# Patient Record
Sex: Male | Born: 1935 | Race: White | Hispanic: No | Smoking: Former smoker
Health system: Southern US, Community
[De-identification: ages and names within clinical notes are randomized; demographics above are authoritative.]

## PROBLEM LIST (undated history)

## (undated) DIAGNOSIS — Z95810 Presence of automatic (implantable) cardiac defibrillator: Secondary | ICD-10-CM

## (undated) DIAGNOSIS — I714 Abdominal aortic aneurysm, without rupture, unspecified: Secondary | ICD-10-CM

## (undated) DIAGNOSIS — Z8701 Personal history of pneumonia (recurrent): Secondary | ICD-10-CM

## (undated) DIAGNOSIS — I5042 Chronic combined systolic (congestive) and diastolic (congestive) heart failure: Secondary | ICD-10-CM

## (undated) DIAGNOSIS — M1990 Unspecified osteoarthritis, unspecified site: Secondary | ICD-10-CM

## (undated) DIAGNOSIS — M549 Dorsalgia, unspecified: Secondary | ICD-10-CM

## (undated) DIAGNOSIS — Z860100 Personal history of colon polyps, unspecified: Secondary | ICD-10-CM

## (undated) DIAGNOSIS — I779 Disorder of arteries and arterioles, unspecified: Secondary | ICD-10-CM

## (undated) DIAGNOSIS — I739 Peripheral vascular disease, unspecified: Secondary | ICD-10-CM

## (undated) DIAGNOSIS — IMO0002 Reserved for concepts with insufficient information to code with codable children: Secondary | ICD-10-CM

## (undated) DIAGNOSIS — I119 Hypertensive heart disease without heart failure: Secondary | ICD-10-CM

## (undated) DIAGNOSIS — I10 Essential (primary) hypertension: Secondary | ICD-10-CM

## (undated) DIAGNOSIS — T82198A Other mechanical complication of other cardiac electronic device, initial encounter: Secondary | ICD-10-CM

## (undated) DIAGNOSIS — R55 Syncope and collapse: Secondary | ICD-10-CM

## (undated) DIAGNOSIS — Z720 Tobacco use: Secondary | ICD-10-CM

## (undated) DIAGNOSIS — I255 Ischemic cardiomyopathy: Secondary | ICD-10-CM

## (undated) DIAGNOSIS — I2510 Atherosclerotic heart disease of native coronary artery without angina pectoris: Secondary | ICD-10-CM

## (undated) DIAGNOSIS — K5730 Diverticulosis of large intestine without perforation or abscess without bleeding: Secondary | ICD-10-CM

## (undated) DIAGNOSIS — C4490 Unspecified malignant neoplasm of skin, unspecified: Secondary | ICD-10-CM

## (undated) DIAGNOSIS — E7800 Pure hypercholesterolemia, unspecified: Secondary | ICD-10-CM

## (undated) DIAGNOSIS — I472 Ventricular tachycardia: Secondary | ICD-10-CM

## (undated) DIAGNOSIS — J449 Chronic obstructive pulmonary disease, unspecified: Secondary | ICD-10-CM

## (undated) DIAGNOSIS — Z8739 Personal history of other diseases of the musculoskeletal system and connective tissue: Secondary | ICD-10-CM

## (undated) DIAGNOSIS — L309 Dermatitis, unspecified: Secondary | ICD-10-CM

## (undated) DIAGNOSIS — Z950 Presence of cardiac pacemaker: Secondary | ICD-10-CM

## (undated) HISTORY — PX: CARDIAC CATHETERIZATION: SHX172

## (undated) HISTORY — DX: Peripheral vascular disease, unspecified: I73.9

## (undated) HISTORY — PX: CARDIAC DEFIBRILLATOR PLACEMENT: SHX171

## (undated) HISTORY — DX: Reserved for concepts with insufficient information to code with codable children: IMO0002

## (undated) HISTORY — DX: Abdominal aortic aneurysm, without rupture: I71.4

## (undated) HISTORY — DX: Chronic obstructive pulmonary disease, unspecified: J44.9

## (undated) HISTORY — DX: Other mechanical complication of other cardiac electronic device, initial encounter: T82.198A

## (undated) HISTORY — DX: Chronic combined systolic (congestive) and diastolic (congestive) heart failure: I50.42

## (undated) HISTORY — PX: SQUAMOUS CELL CARCINOMA EXCISION: SHX2433

## (undated) HISTORY — DX: Essential (primary) hypertension: I10

## (undated) HISTORY — DX: Dermatitis, unspecified: L30.9

## (undated) HISTORY — DX: Tobacco use: Z72.0

## (undated) HISTORY — DX: Diverticulosis of large intestine without perforation or abscess without bleeding: K57.30

## (undated) HISTORY — DX: Abdominal aortic aneurysm, without rupture, unspecified: I71.40

## (undated) HISTORY — PX: TONSILLECTOMY: SUR1361

## (undated) HISTORY — DX: Personal history of colonic polyps: Z86.010

## (undated) HISTORY — DX: Personal history of pneumonia (recurrent): Z87.01

## (undated) HISTORY — PX: OTHER SURGICAL HISTORY: SHX169

## (undated) HISTORY — DX: Dorsalgia, unspecified: M54.9

## (undated) HISTORY — DX: Disorder of arteries and arterioles, unspecified: I77.9

## (undated) HISTORY — DX: Personal history of other diseases of the musculoskeletal system and connective tissue: Z87.39

## (undated) HISTORY — DX: Unspecified osteoarthritis, unspecified site: M19.90

## (undated) HISTORY — DX: Hypertensive heart disease without heart failure: I11.9

## (undated) HISTORY — DX: Atherosclerotic heart disease of native coronary artery without angina pectoris: I25.10

## (undated) HISTORY — DX: Syncope and collapse: R55

## (undated) HISTORY — PX: EXTERNAL EAR SURGERY: SHX627

## (undated) HISTORY — DX: Presence of automatic (implantable) cardiac defibrillator: Z95.810

## (undated) HISTORY — DX: Ischemic cardiomyopathy: I25.5

## (undated) HISTORY — DX: Pure hypercholesterolemia, unspecified: E78.00

## (undated) HISTORY — DX: Personal history of colon polyps, unspecified: Z86.0100

---

## 1998-05-23 ENCOUNTER — Ambulatory Visit: Admission: RE | Admit: 1998-05-23 | Discharge: 1998-05-23 | Payer: Self-pay | Admitting: Vascular Surgery

## 1998-05-30 ENCOUNTER — Inpatient Hospital Stay (HOSPITAL_COMMUNITY): Admission: RE | Admit: 1998-05-30 | Discharge: 1998-06-04 | Payer: Self-pay | Admitting: Vascular Surgery

## 2000-09-11 ENCOUNTER — Encounter (INDEPENDENT_AMBULATORY_CARE_PROVIDER_SITE_OTHER): Payer: Self-pay | Admitting: Specialist

## 2000-09-11 ENCOUNTER — Other Ambulatory Visit: Admission: RE | Admit: 2000-09-11 | Discharge: 2000-09-11 | Payer: Self-pay | Admitting: Gastroenterology

## 2000-10-22 HISTORY — PX: ABDOMINAL AORTIC ANEURYSM REPAIR: SUR1152

## 2000-11-06 ENCOUNTER — Ambulatory Visit (HOSPITAL_COMMUNITY): Admission: RE | Admit: 2000-11-06 | Discharge: 2000-11-06 | Payer: Self-pay | Admitting: Specialist

## 2002-02-19 ENCOUNTER — Inpatient Hospital Stay (HOSPITAL_COMMUNITY): Admission: RE | Admit: 2002-02-19 | Discharge: 2002-02-21 | Payer: Self-pay | Admitting: Cardiology

## 2002-02-19 HISTORY — PX: OTHER SURGICAL HISTORY: SHX169

## 2004-10-31 ENCOUNTER — Ambulatory Visit: Payer: Self-pay | Admitting: Pulmonary Disease

## 2004-11-01 ENCOUNTER — Ambulatory Visit: Payer: Self-pay | Admitting: Pulmonary Disease

## 2005-03-26 ENCOUNTER — Ambulatory Visit: Payer: Self-pay | Admitting: Pulmonary Disease

## 2005-03-27 ENCOUNTER — Ambulatory Visit: Payer: Self-pay | Admitting: Pulmonary Disease

## 2005-08-27 ENCOUNTER — Ambulatory Visit: Payer: Self-pay | Admitting: Gastroenterology

## 2005-09-10 ENCOUNTER — Encounter (INDEPENDENT_AMBULATORY_CARE_PROVIDER_SITE_OTHER): Payer: Self-pay | Admitting: *Deleted

## 2005-09-10 ENCOUNTER — Ambulatory Visit: Payer: Self-pay | Admitting: Gastroenterology

## 2005-09-25 ENCOUNTER — Ambulatory Visit: Payer: Self-pay | Admitting: Pulmonary Disease

## 2005-10-01 ENCOUNTER — Ambulatory Visit: Payer: Self-pay | Admitting: Pulmonary Disease

## 2006-04-25 ENCOUNTER — Ambulatory Visit: Payer: Self-pay | Admitting: Pulmonary Disease

## 2006-05-09 ENCOUNTER — Ambulatory Visit: Payer: Self-pay | Admitting: Pulmonary Disease

## 2006-08-06 ENCOUNTER — Ambulatory Visit: Payer: Self-pay | Admitting: Internal Medicine

## 2006-10-29 ENCOUNTER — Ambulatory Visit: Payer: Self-pay | Admitting: Pulmonary Disease

## 2006-10-31 ENCOUNTER — Ambulatory Visit: Payer: Self-pay | Admitting: Cardiology

## 2006-11-01 ENCOUNTER — Ambulatory Visit: Payer: Self-pay | Admitting: Pulmonary Disease

## 2006-11-01 LAB — CONVERTED CEMR LAB
Albumin: 4.1 g/dL (ref 3.5–5.2)
BUN: 12 mg/dL (ref 6–23)
CO2: 27 meq/L (ref 19–32)
Calcium: 9.8 mg/dL (ref 8.4–10.5)
Chloride: 108 meq/L (ref 96–112)
Chol/HDL Ratio, serum: 3.4
Cholesterol: 149 mg/dL (ref 0–200)
Creatinine, Ser: 1.1 mg/dL (ref 0.4–1.5)
GFR calc non Af Amer: 70 mL/min
Triglyceride fasting, serum: 163 mg/dL — ABNORMAL HIGH (ref 0–149)

## 2006-11-13 ENCOUNTER — Ambulatory Visit: Payer: Self-pay

## 2006-12-11 ENCOUNTER — Ambulatory Visit: Payer: Self-pay

## 2006-12-11 ENCOUNTER — Encounter: Payer: Self-pay | Admitting: Cardiology

## 2007-01-07 ENCOUNTER — Ambulatory Visit: Payer: Self-pay | Admitting: Cardiology

## 2007-01-21 ENCOUNTER — Ambulatory Visit: Payer: Self-pay | Admitting: Cardiology

## 2007-01-21 LAB — CONVERTED CEMR LAB
Bilirubin, Direct: 0.1 mg/dL (ref 0.0–0.3)
CO2: 27 meq/L (ref 19–32)
Cholesterol: 131 mg/dL (ref 0–200)
GFR calc Af Amer: 76 mL/min
Glucose, Bld: 77 mg/dL (ref 70–99)
HDL: 40.9 mg/dL (ref >39.0)
Potassium: 4.8 meq/L (ref 3.5–5.1)
Total Bilirubin: 1 mg/dL (ref 0.3–1.2)
Total CHOL/HDL Ratio: 3.2
Total Protein: 6.8 g/dL (ref 6.0–8.3)
Triglycerides: 100 mg/dL (ref 0–149)

## 2007-02-26 ENCOUNTER — Ambulatory Visit: Payer: Self-pay | Admitting: Cardiology

## 2007-04-11 ENCOUNTER — Ambulatory Visit: Payer: Self-pay | Admitting: Cardiology

## 2007-05-12 ENCOUNTER — Ambulatory Visit: Payer: Self-pay | Admitting: Cardiology

## 2007-05-26 ENCOUNTER — Ambulatory Visit: Payer: Self-pay | Admitting: Cardiology

## 2007-05-26 LAB — CONVERTED CEMR LAB
Calcium: 9.7 mg/dL (ref 8.4–10.5)
Chloride: 103 meq/L (ref 96–112)
GFR calc Af Amer: 69 mL/min
GFR calc non Af Amer: 57 mL/min
Sodium: 138 meq/L (ref 135–145)

## 2007-06-19 ENCOUNTER — Ambulatory Visit: Payer: Self-pay | Admitting: Cardiology

## 2007-07-21 ENCOUNTER — Ambulatory Visit: Payer: Self-pay | Admitting: Cardiology

## 2007-07-30 ENCOUNTER — Ambulatory Visit: Payer: Self-pay | Admitting: Pulmonary Disease

## 2007-10-23 HISTORY — PX: CARDIAC DEFIBRILLATOR PLACEMENT: SHX171

## 2007-12-08 DIAGNOSIS — I739 Peripheral vascular disease, unspecified: Secondary | ICD-10-CM

## 2007-12-08 DIAGNOSIS — M1990 Unspecified osteoarthritis, unspecified site: Secondary | ICD-10-CM | POA: Insufficient documentation

## 2007-12-08 DIAGNOSIS — K649 Unspecified hemorrhoids: Secondary | ICD-10-CM | POA: Insufficient documentation

## 2007-12-08 DIAGNOSIS — I10 Essential (primary) hypertension: Secondary | ICD-10-CM

## 2007-12-08 DIAGNOSIS — J449 Chronic obstructive pulmonary disease, unspecified: Secondary | ICD-10-CM

## 2007-12-09 ENCOUNTER — Ambulatory Visit: Payer: Self-pay | Admitting: Pulmonary Disease

## 2007-12-09 DIAGNOSIS — K5730 Diverticulosis of large intestine without perforation or abscess without bleeding: Secondary | ICD-10-CM

## 2007-12-09 DIAGNOSIS — D126 Benign neoplasm of colon, unspecified: Secondary | ICD-10-CM | POA: Insufficient documentation

## 2007-12-15 ENCOUNTER — Ambulatory Visit: Payer: Self-pay | Admitting: Pulmonary Disease

## 2007-12-22 LAB — CONVERTED CEMR LAB
Albumin: 4 g/dL (ref 3.5–5.2)
Basophils Absolute: 0 10*3/uL (ref 0.0–0.1)
Bilirubin, Direct: 0.2 mg/dL (ref 0.0–0.3)
Chloride: 101 meq/L (ref 96–112)
Cholesterol: 124 mg/dL (ref 0–200)
Eosinophils Absolute: 0.9 10*3/uL — ABNORMAL HIGH (ref 0.0–0.6)
Eosinophils Relative: 17.9 % — ABNORMAL HIGH (ref 0.0–5.0)
GFR calc Af Amer: 84 mL/min
GFR calc non Af Amer: 70 mL/min
Glucose, Bld: 93 mg/dL (ref 70–99)
HCT: 42.7 % (ref 39.0–52.0)
Lymphocytes Relative: 25.9 % (ref 12.0–46.0)
MCHC: 34.1 g/dL (ref 30.0–36.0)
MCV: 105.6 fL — ABNORMAL HIGH (ref 78.0–100.0)
Monocytes Absolute: 0.4 10*3/uL (ref 0.2–0.7)
Neutro Abs: 2.3 10*3/uL (ref 1.4–7.7)
Neutrophils Relative %: 47.1 % (ref 43.0–77.0)
PSA: 1.11 ng/mL (ref 0.10–4.00)
Potassium: 4.8 meq/L (ref 3.5–5.1)
RBC: 4.04 M/uL — ABNORMAL LOW (ref 4.22–5.81)
Sodium: 138 meq/L (ref 135–145)
TSH: 2.48 microintl units/mL (ref 0.35–5.50)
Total CHOL/HDL Ratio: 3.4
Uric Acid, Serum: 3.3 mg/dL (ref 2.4–7.0)

## 2008-02-03 ENCOUNTER — Ambulatory Visit: Payer: Self-pay | Admitting: Cardiology

## 2008-03-22 HISTORY — PX: OTHER SURGICAL HISTORY: SHX169

## 2008-03-23 ENCOUNTER — Ambulatory Visit: Payer: Self-pay

## 2008-03-23 ENCOUNTER — Ambulatory Visit: Payer: Self-pay | Admitting: Internal Medicine

## 2008-03-23 ENCOUNTER — Ambulatory Visit: Payer: Self-pay | Admitting: Pulmonary Disease

## 2008-03-23 LAB — CONVERTED CEMR LAB
Basophils Absolute: 0 10*3/uL (ref 0.0–0.1)
CO2: 29 meq/L (ref 19–32)
Calcium: 10.2 mg/dL (ref 8.4–10.5)
GFR calc Af Amer: 76 mL/min
Glucose, Bld: 111 mg/dL — ABNORMAL HIGH (ref 70–99)
Hemoglobin: 14.1 g/dL (ref 13.0–17.0)
INR: 1 (ref 0.8–1.0)
Lymphocytes Relative: 18.2 % (ref 12.0–46.0)
MCHC: 35.3 g/dL (ref 30.0–36.0)
Monocytes Relative: 10.6 % (ref 3.0–12.0)
Neutro Abs: 4.2 10*3/uL (ref 1.4–7.7)
Platelets: 102 10*3/uL — ABNORMAL LOW (ref 150–400)
Potassium: 5.2 meq/L — ABNORMAL HIGH (ref 3.5–5.1)
Prothrombin Time: 11.6 s (ref 10.9–13.3)
RDW: 11.8 % (ref 11.5–14.6)
Sodium: 133 meq/L — ABNORMAL LOW (ref 135–145)
aPTT: 26.4 s (ref 21.7–29.8)

## 2008-03-25 ENCOUNTER — Ambulatory Visit: Payer: Self-pay | Admitting: Adult Health

## 2008-03-25 ENCOUNTER — Ambulatory Visit: Payer: Self-pay | Admitting: Cardiology

## 2008-03-25 LAB — CONVERTED CEMR LAB
BUN: 13 mg/dL (ref 6–23)
Basophils Relative: 0.3 % (ref 0.0–1.0)
CO2: 27 meq/L (ref 19–32)
Chloride: 92 meq/L — ABNORMAL LOW (ref 96–112)
Creatinine, Ser: 1 mg/dL (ref 0.4–1.5)
Eosinophils Relative: 10.6 % — ABNORMAL HIGH (ref 0.0–5.0)
Glucose, Bld: 155 mg/dL — ABNORMAL HIGH (ref 70–99)
HCT: 40.4 % (ref 39.0–52.0)
Monocytes Relative: 8.4 % (ref 3.0–12.0)
Neutrophils Relative %: 63 % (ref 43.0–77.0)
Platelets: 124 10*3/uL — ABNORMAL LOW (ref 150–400)
Potassium: 4 meq/L (ref 3.5–5.1)
RBC: 3.87 M/uL — ABNORMAL LOW (ref 4.22–5.81)
WBC: 6 10*3/uL (ref 4.5–10.5)

## 2008-03-29 ENCOUNTER — Ambulatory Visit: Payer: Self-pay | Admitting: Cardiology

## 2008-03-29 LAB — CONVERTED CEMR LAB
BUN: 11 mg/dL (ref 6–23)
CO2: 30 meq/L (ref 19–32)
Chloride: 93 meq/L — ABNORMAL LOW (ref 96–112)
Creatinine, Ser: 1 mg/dL (ref 0.4–1.5)
GFR calc non Af Amer: 77 mL/min
Glucose, Bld: 149 mg/dL — ABNORMAL HIGH (ref 70–99)
Potassium: 4.7 meq/L (ref 3.5–5.1)

## 2008-03-30 ENCOUNTER — Ambulatory Visit: Payer: Self-pay | Admitting: Cardiology

## 2008-03-30 ENCOUNTER — Inpatient Hospital Stay (HOSPITAL_BASED_OUTPATIENT_CLINIC_OR_DEPARTMENT_OTHER): Admission: RE | Admit: 2008-03-30 | Discharge: 2008-03-30 | Payer: Self-pay | Admitting: Cardiology

## 2008-03-30 ENCOUNTER — Telehealth (INDEPENDENT_AMBULATORY_CARE_PROVIDER_SITE_OTHER): Payer: Self-pay | Admitting: *Deleted

## 2008-03-31 ENCOUNTER — Ambulatory Visit (HOSPITAL_COMMUNITY): Admission: RE | Admit: 2008-03-31 | Discharge: 2008-03-31 | Payer: Self-pay | Admitting: Cardiology

## 2008-03-31 ENCOUNTER — Telehealth: Payer: Self-pay | Admitting: Pulmonary Disease

## 2008-04-01 ENCOUNTER — Ambulatory Visit: Payer: Self-pay | Admitting: Internal Medicine

## 2008-04-03 ENCOUNTER — Ambulatory Visit (HOSPITAL_COMMUNITY): Admission: RE | Admit: 2008-04-03 | Discharge: 2008-04-03 | Payer: Self-pay | Admitting: Internal Medicine

## 2008-04-07 ENCOUNTER — Encounter: Payer: Self-pay | Admitting: Internal Medicine

## 2008-04-07 LAB — CONVERTED CEMR LAB
BUN: 9 mg/dL (ref 6–23)
Basophils Relative: 0.6 % (ref 0.0–1.0)
Calcium: 9.9 mg/dL (ref 8.4–10.5)
Chloride: 99 meq/L (ref 96–112)
Creatinine, Ser: 1 mg/dL (ref 0.4–1.5)
Eosinophils Absolute: 0.6 10*3/uL (ref 0.0–0.7)
Eosinophils Relative: 8.6 % — ABNORMAL HIGH (ref 0.0–5.0)
GFR calc non Af Amer: 77 mL/min
HCT: 39.7 % (ref 39.0–52.0)
Hemoglobin: 14.1 g/dL (ref 13.0–17.0)
MCV: 104.4 fL — ABNORMAL HIGH (ref 78.0–100.0)
Neutro Abs: 3.7 10*3/uL (ref 1.4–7.7)
Neutrophils Relative %: 55.9 % (ref 43.0–77.0)
RBC: 3.8 M/uL — ABNORMAL LOW (ref 4.22–5.81)
WBC: 6.7 10*3/uL (ref 4.5–10.5)

## 2008-04-12 ENCOUNTER — Telehealth (INDEPENDENT_AMBULATORY_CARE_PROVIDER_SITE_OTHER): Payer: Self-pay | Admitting: *Deleted

## 2008-04-13 ENCOUNTER — Inpatient Hospital Stay (HOSPITAL_COMMUNITY): Admission: AD | Admit: 2008-04-13 | Discharge: 2008-04-14 | Payer: Self-pay | Admitting: Internal Medicine

## 2008-04-13 ENCOUNTER — Ambulatory Visit: Payer: Self-pay | Admitting: Internal Medicine

## 2008-04-13 DIAGNOSIS — Z95810 Presence of automatic (implantable) cardiac defibrillator: Secondary | ICD-10-CM

## 2008-04-14 ENCOUNTER — Encounter (INDEPENDENT_AMBULATORY_CARE_PROVIDER_SITE_OTHER): Payer: Self-pay | Admitting: Interventional Radiology

## 2008-04-14 ENCOUNTER — Encounter: Payer: Self-pay | Admitting: Pulmonary Disease

## 2008-04-20 ENCOUNTER — Telehealth (INDEPENDENT_AMBULATORY_CARE_PROVIDER_SITE_OTHER): Payer: Self-pay | Admitting: *Deleted

## 2008-04-22 ENCOUNTER — Ambulatory Visit: Payer: Self-pay | Admitting: Internal Medicine

## 2008-04-29 ENCOUNTER — Ambulatory Visit: Payer: Self-pay

## 2008-04-29 ENCOUNTER — Ambulatory Visit: Payer: Self-pay | Admitting: Internal Medicine

## 2008-04-29 ENCOUNTER — Ambulatory Visit (HOSPITAL_COMMUNITY): Admission: RE | Admit: 2008-04-29 | Discharge: 2008-04-30 | Payer: Self-pay | Admitting: Internal Medicine

## 2008-05-19 ENCOUNTER — Ambulatory Visit: Payer: Self-pay

## 2008-07-14 ENCOUNTER — Ambulatory Visit: Payer: Self-pay | Admitting: Pulmonary Disease

## 2008-07-20 ENCOUNTER — Ambulatory Visit: Payer: Self-pay | Admitting: Internal Medicine

## 2008-07-26 ENCOUNTER — Telehealth: Payer: Self-pay | Admitting: Pulmonary Disease

## 2008-07-26 ENCOUNTER — Ambulatory Visit: Payer: Self-pay | Admitting: Pulmonary Disease

## 2008-07-27 DIAGNOSIS — T148XXA Other injury of unspecified body region, initial encounter: Secondary | ICD-10-CM

## 2008-07-28 ENCOUNTER — Encounter: Payer: Self-pay | Admitting: Pulmonary Disease

## 2008-07-28 ENCOUNTER — Ambulatory Visit: Payer: Self-pay | Admitting: Internal Medicine

## 2008-07-30 ENCOUNTER — Encounter: Payer: Self-pay | Admitting: Pulmonary Disease

## 2008-10-19 ENCOUNTER — Ambulatory Visit: Payer: Self-pay | Admitting: Internal Medicine

## 2008-10-25 ENCOUNTER — Encounter: Payer: Self-pay | Admitting: Pulmonary Disease

## 2008-10-26 ENCOUNTER — Ambulatory Visit: Payer: Self-pay | Admitting: Pulmonary Disease

## 2008-11-02 ENCOUNTER — Ambulatory Visit: Payer: Self-pay

## 2008-12-28 ENCOUNTER — Encounter: Payer: Self-pay | Admitting: Internal Medicine

## 2009-01-18 ENCOUNTER — Ambulatory Visit: Payer: Self-pay | Admitting: Internal Medicine

## 2009-01-27 ENCOUNTER — Encounter: Payer: Self-pay | Admitting: Cardiology

## 2009-01-27 ENCOUNTER — Ambulatory Visit: Payer: Self-pay | Admitting: Cardiology

## 2009-02-04 ENCOUNTER — Telehealth (INDEPENDENT_AMBULATORY_CARE_PROVIDER_SITE_OTHER): Payer: Self-pay | Admitting: *Deleted

## 2009-02-07 ENCOUNTER — Ambulatory Visit: Payer: Self-pay | Admitting: Cardiology

## 2009-02-07 ENCOUNTER — Encounter: Payer: Self-pay | Admitting: Cardiology

## 2009-02-07 ENCOUNTER — Ambulatory Visit: Payer: Self-pay

## 2009-02-09 ENCOUNTER — Ambulatory Visit: Payer: Self-pay | Admitting: Pulmonary Disease

## 2009-02-15 DIAGNOSIS — J309 Allergic rhinitis, unspecified: Secondary | ICD-10-CM | POA: Insufficient documentation

## 2009-04-19 ENCOUNTER — Ambulatory Visit: Payer: Self-pay | Admitting: Internal Medicine

## 2009-04-19 DIAGNOSIS — R55 Syncope and collapse: Secondary | ICD-10-CM

## 2009-04-29 ENCOUNTER — Ambulatory Visit: Payer: Self-pay | Admitting: Cardiology

## 2009-05-18 ENCOUNTER — Ambulatory Visit: Payer: Self-pay

## 2009-05-18 ENCOUNTER — Encounter: Payer: Self-pay | Admitting: Internal Medicine

## 2009-07-21 ENCOUNTER — Ambulatory Visit: Payer: Self-pay | Admitting: Cardiology

## 2009-07-21 ENCOUNTER — Encounter: Payer: Self-pay | Admitting: Internal Medicine

## 2009-08-22 ENCOUNTER — Encounter: Payer: Self-pay | Admitting: Internal Medicine

## 2009-08-30 ENCOUNTER — Telehealth (INDEPENDENT_AMBULATORY_CARE_PROVIDER_SITE_OTHER): Payer: Self-pay | Admitting: *Deleted

## 2009-09-23 ENCOUNTER — Ambulatory Visit: Payer: Self-pay | Admitting: Pulmonary Disease

## 2009-09-23 DIAGNOSIS — M8000XA Age-related osteoporosis with current pathological fracture, unspecified site, initial encounter for fracture: Secondary | ICD-10-CM

## 2009-09-23 DIAGNOSIS — M109 Gout, unspecified: Secondary | ICD-10-CM

## 2009-10-17 ENCOUNTER — Encounter: Payer: Self-pay | Admitting: Internal Medicine

## 2009-10-27 ENCOUNTER — Encounter: Payer: Self-pay | Admitting: Internal Medicine

## 2009-10-27 ENCOUNTER — Ambulatory Visit: Payer: Self-pay

## 2009-11-15 ENCOUNTER — Telehealth: Payer: Self-pay | Admitting: Pulmonary Disease

## 2009-11-17 ENCOUNTER — Encounter: Payer: Self-pay | Admitting: Cardiology

## 2009-11-17 ENCOUNTER — Encounter: Payer: Self-pay | Admitting: Internal Medicine

## 2009-11-17 ENCOUNTER — Ambulatory Visit: Payer: Self-pay

## 2009-11-17 DIAGNOSIS — I739 Peripheral vascular disease, unspecified: Secondary | ICD-10-CM

## 2010-02-16 ENCOUNTER — Encounter: Payer: Self-pay | Admitting: Internal Medicine

## 2010-04-07 ENCOUNTER — Ambulatory Visit: Payer: Self-pay | Admitting: Internal Medicine

## 2010-05-29 ENCOUNTER — Encounter: Payer: Self-pay | Admitting: Cardiology

## 2010-06-01 ENCOUNTER — Encounter: Payer: Self-pay | Admitting: Internal Medicine

## 2010-06-02 ENCOUNTER — Ambulatory Visit: Payer: Self-pay | Admitting: Cardiology

## 2010-06-02 ENCOUNTER — Ambulatory Visit: Payer: Self-pay

## 2010-06-02 ENCOUNTER — Encounter: Payer: Self-pay | Admitting: Internal Medicine

## 2010-06-16 ENCOUNTER — Ambulatory Visit: Payer: Self-pay | Admitting: Pulmonary Disease

## 2010-06-16 ENCOUNTER — Ambulatory Visit: Payer: Self-pay | Admitting: Internal Medicine

## 2010-06-20 ENCOUNTER — Telehealth (INDEPENDENT_AMBULATORY_CARE_PROVIDER_SITE_OTHER): Payer: Self-pay | Admitting: *Deleted

## 2010-07-07 ENCOUNTER — Encounter: Payer: Self-pay | Admitting: Internal Medicine

## 2010-08-17 ENCOUNTER — Telehealth (INDEPENDENT_AMBULATORY_CARE_PROVIDER_SITE_OTHER): Payer: Self-pay | Admitting: *Deleted

## 2010-10-10 ENCOUNTER — Ambulatory Visit: Payer: Self-pay | Admitting: Pulmonary Disease

## 2010-10-11 ENCOUNTER — Encounter: Payer: Self-pay | Admitting: Pulmonary Disease

## 2010-10-11 ENCOUNTER — Ambulatory Visit: Payer: Self-pay | Admitting: Pulmonary Disease

## 2010-10-18 ENCOUNTER — Encounter (INDEPENDENT_AMBULATORY_CARE_PROVIDER_SITE_OTHER): Payer: Self-pay | Admitting: *Deleted

## 2010-10-21 ENCOUNTER — Encounter: Payer: Self-pay | Admitting: Internal Medicine

## 2010-10-21 LAB — CONVERTED CEMR LAB
ALT: 19 units/L (ref 0–53)
AST: 38 units/L — ABNORMAL HIGH (ref 0–37)
Basophils Relative: 0.6 % (ref 0.0–3.0)
Bilirubin, Direct: 0.2 mg/dL (ref 0.0–0.3)
Chloride: 95 meq/L — ABNORMAL LOW (ref 96–112)
Cholesterol: 112 mg/dL (ref 0–200)
Eosinophils Absolute: 1 10*3/uL — ABNORMAL HIGH (ref 0.0–0.7)
LDL Cholesterol: 59 mg/dL (ref 0–99)
MCHC: 35.2 g/dL (ref 30.0–36.0)
MCV: 105.2 fL — ABNORMAL HIGH (ref 78.0–100.0)
Monocytes Absolute: 0.4 10*3/uL (ref 0.1–1.0)
Neutrophils Relative %: 51.4 % (ref 43.0–77.0)
PSA: 1.71 ng/mL (ref 0.10–4.00)
Platelets: 113 10*3/uL — ABNORMAL LOW (ref 150.0–400.0)
Potassium: 4.5 meq/L (ref 3.5–5.1)
TSH: 1.79 microintl units/mL (ref 0.35–5.50)
Total Bilirubin: 1.1 mg/dL (ref 0.3–1.2)
Total CHOL/HDL Ratio: 3

## 2010-10-24 ENCOUNTER — Telehealth: Payer: Self-pay | Admitting: Internal Medicine

## 2010-10-24 ENCOUNTER — Telehealth (INDEPENDENT_AMBULATORY_CARE_PROVIDER_SITE_OTHER): Payer: Self-pay | Admitting: *Deleted

## 2010-11-19 LAB — CONVERTED CEMR LAB
BUN: 14 mg/dL (ref 6–23)
CO2: 31 meq/L (ref 19–32)
Chloride: 102 meq/L (ref 96–112)
Creatinine, Ser: 0.9 mg/dL (ref 0.4–1.5)

## 2010-11-23 NOTE — Miscellaneous (Signed)
Summary: Orders Update  Clinical Lists Changes  Problems: Added new problem of CAROTID ARTERY DISEASE (ICD-433.10) Orders: Added new Test order of Carotid Duplex (Carotid Duplex) - Signed 

## 2010-11-23 NOTE — Letter (Signed)
Summary: Device-Delinquent Phone Journalist, newspaper, Main Office  [address]   [place], [place] [address]   Phone: [phone]  Fax: [phone]     February 16, 2010 MRN: [id number]   XAVIER MUNGER [address] [place], [place]  [address]   Dear Mr. VANDERWOUDE,  According to our records, you were scheduled for a device phone transmission on 01-24-2010.     We did not receive any results from this check.  If you transmitted on your scheduled day, please call us to help troubleshoot your system.  If you forgot to send your transmission, please send one upon receipt of this letter.  Thank you,   [facility]

## 2010-11-23 NOTE — Assessment & Plan Note (Signed)
Summary: Acute NP office visit - LBP   Primary Ellianne Gowen/Referring Tabetha Haraway:  Alroy Dust, MD  CC:  low back pain centered on the right and sharp pain with movement 4 weeks .  History of Present Illness: 77 y/o WM here with known history of cardiomyopathy/AICD, COPD current smoker, that drinks alcohol on daily basis    ~  Oct09:  his wife called today stating that he was having increased back pain and needed to be seen... he has a hx of SYNCOPE 5/09 w/ compression fracture T12... an MRI lumbar spine showed T12 compression and some mild DDD, facet arthropathy, foraminal narrowing,etc... he had a T12 kyphoplasty by Rober Minion 04/14/08 & improved considerably... but he states conrtinued LBP, discomfort esp early in AM, and w/ prolonged standing- to the point that he doesn't want to do much activity because of the pain... BMD showed TScores +0.3 in Spine, & -2.7 in right FemNeck>> seen by Hortense Ramal- no change in Rx, offered PT/ injection if pain worsened... he was supposed to take Caltrate/ MVI/ Vit D...    ~  September 23, 2009:  he had a fall at home 2-3 weeks ago (slipped taking in garbage cans)- right rib trauma, and improving slowly, no exac of LBP etc... he had f/u DrKlein 6/10- pacer OK; and f/u DrHochrein 9/10- tried to incr his Coreg but pt didn't tolerate higher dose... despite everything he continues to smoke cigars regularly...  June 16, 2010 --Pt presents for work in visit. Complains of low back pain centered on the right, sharp pain with movement x4 weeks. Pt was seen by cardiology for syncopal episode 2 weeks ago.  York Spaniel he passed out in living room 4 weeks ago, w/ low b/p  ~ 70/38. He reduced his Coreg to once daily and his lisinopril to once daily on his own.  Pt udnerwent carotid doppler>> 60-79% bilaterally  Felt syncope d/t hypotension (along w/ ETOH) w/ HTN meds adjusted. He has had no further episode. But complains that back is sore to touch, and is not getting better. Used  some otc without much help. Denies chest pain, dyspnea, orthopnea, hemoptysis, fever, n/v/d, edema, headache, ext weakness, radiuclar symptoms.       Medications Prior to Update: 1)  Adult Aspirin Ec Low Strength 81 Mg  Tbec (Aspirin) .... Take 1 Tablet By Mouth Once A Day 2)  Carvedilol 3.125 Mg Tabs (Carvedilol) .... One Twice A Day 3)  Lisinopril 5 Mg  Tabs (Lisinopril) .... Take 1 Tablet By Mouth Two Times A Day 4)  Simvastatin 40 Mg Tabs (Simvastatin) .... Take One Tablet By Mouth At Bedtime 5)  Tricor 145 Mg Tabs (Fenofibrate) .... Take 1 Tab By Mouth Once Daily.Marland KitchenMarland Kitchen 6)  Allopurinol 100 Mg  Tabs (Allopurinol) .... Take One Tablet Every Day... 7)  Fiberchoice 2 Gm Chew (Inulin) .... Daily  Current Medications (verified): 1)  Adult Aspirin Ec Low Strength 81 Mg  Tbec (Aspirin) .... Take 1 Tablet By Mouth Once A Day 2)  Carvedilol 3.125 Mg Tabs (Carvedilol) .... One Twice A Day 3)  Lisinopril 5 Mg  Tabs (Lisinopril) .... Take 1 Tablet By Mouth Two Times A Day 4)  Simvastatin 40 Mg Tabs (Simvastatin) .... Take One Tablet By Mouth At Bedtime 5)  Tricor 145 Mg Tabs (Fenofibrate) .... Take 1 Tab By Mouth Once Daily.Marland KitchenMarland Kitchen 6)  Allopurinol 100 Mg  Tabs (Allopurinol) .... Take One Tablet Every Day... 7)  Fiberchoice 2 Gm Chew (Inulin) .... Daily  Allergies (verified): No Known  Drug Allergies  Past History:  Past Medical History: Last updated: 09/23/2009 ALLERGIC RHINITIS (ICD-477.9) COPD (ICD-496) TOBACCO ABUSE (ICD-305.1) HYPERTENSION (ICD-401.9) ATHEROSCLEROTIC HEART DISEASE (ICD-414.00) ISCHEMIC CARDIOMYOPATHY (ICD-414.8) Hx of SYNCOPE (ICD-780.2) IMPLANTABLE DEFIBRILLATOR, DDD MDT (ICD-V45.02) PERIPHERAL VASCULAR DISEASE (ICD-443.9) HYPERCHOLESTEROLEMIA (ICD-272.0) DIVERTICULOSIS OF COLON (ICD-562.10) COLONIC POLYPS (ICD-211.3) HEMORRHOIDS (ICD-455.6) Hx of LIVER FUNCTION TESTS, ABNORMAL (ICD-794.8) DEGENERATIVE JOINT DISEASE (ICD-715.90) Hx of GOUT (ICD-274.9) BACK PAIN  (ICD-724.5) COMPRESSION FRACTURE (ICD-829.0) OSTEOPOROSIS (ICD-733.00) DERMATITIS (ICD-692.9)  Past Surgical History: Last updated: 09/23/2009 S/P AAA repair 2000 by Windell Moulding  Family History: Last updated: 10/03/2008 Noncontributory for early coronary artery disease.  Social History: Last updated: 10/03/2008 The patient is married.  He has 3 children.  He is a  retired Clinical research associate.  Smokes 5 cigars, has smoked for 54 years.  Risk Factors: Smoking Status: current (07/26/2008)  Review of Systems      See HPI  Vital Signs:  Patient profile:   77 year old male Height:      71 inches Weight:      169.25 pounds BMI:     23.69 O2 Sat:      98 % on Room air Temp:     97.6 degrees F oral Pulse rate:   76 / minute BP sitting:   138 / 80  (right arm) Cuff size:   regular  Vitals Entered By: Boone Master CNA/MA (June 16, 2010 11:43 AM)  O2 Flow:  Room air CC: low back pain centered on the right, sharp pain with movement 4 weeks  Is Patient Diabetic? No Comments Medications reviewed with patient Daytime contact number verified with patient. Boone Master CNA/MA  June 16, 2010 11:43 AM    Physical Exam  Additional Exam:  WD, WN, 76 y/o WM in NAD... GENERAL:  Alert & oriented; pleasant & cooperative... HEENT:  [place]/AT,   EACs-clear  NOSE-clear, THROAT-clear & wnl. NECK:  Supple w/ fairROM; no JVD; normal carotid impulses w/o bruits; no thyromegaly or nodules palpated; no lymphadenopathy. CHEST:  bilat rhonchi at bases,  no rales, no signs of consolidation... HEART:  Regular Rhythm; gr 1/6 SEM, S4, no rubs... ABDOMEN:  Soft & nontender; normal bowel sounds; no organomegaly or masses detected. EXT: without deformities, mild arthritic changes; no varicose veins/ +venous insuffic/ no edema. tender along mid low back, no eccymosis noted, neg SLR, no radicular symptaom, equal strength.  NEURO:  alert no focal deficits DERM:  dry skin dermatitis, seborrhea,  rosacea...     Impression & Recommendations:  Problem # 1:  BACK PAIN (ICD-724.5)  secondary to strain from fall 4 weeks ago, will check xray to r/o fx.  Plan (due to alchohol use will use pain meds and nsaids cautiously, pt aware of risks of taking meds w/ alcohol.  Advil 200mg  2 tabs two times a day for 3 days w/ food. -AVOID ALL ALCOHOL  Skelaxin 800mg  1/2 three times a day as needed back pain.  Vicodin 1/2 tab two times a day as needed severe pain ---these will make you sleepy>>>DO NOT USE WITH ALCOHOL.  I will call with xray results.  Please contact office for sooner follow up if symptoms do not improve or worsen  Heat to low back as needed   Orders: T-Lumbar Spine Complete, 5 Views (71110TC)  Problem # 2:  HYPERTENSION (ICD-401.9) b/p compensated  His updated medication list for this problem includes:    Carvedilol 3.125 Mg Tabs (Carvedilol) ..... One twice a day    Lisinopril 5 Mg Tabs (Lisinopril) .Marland KitchenMarland KitchenMarland KitchenMarland Kitchen  Take 1 tablet by mouth two times a day  BP today: 138/80 Prior BP: 142/72 (06/02/2010)  Labs Reviewed: K+: 4.7 (02/07/2009) Creat: : 0.9 (02/07/2009)   Chol: 124 (12/15/2007)   HDL: 37.0 (12/15/2007)   LDL: 61 (12/15/2007)   TG: 132 (12/15/2007)  Problem # 3:  SYNCOPE (ICD-780.2) no further episodes since change in meds dose advised on alcohol cesstation Orders: Est. Patient Level IV ([address])  Medications Added to Medication List This Visit: 1)  Skelaxin 800 Mg Tabs (Metaxalone) .... 1/2 two times a day as needed muscle spasm, back pain 2)  Vicodin 5-500 Mg Tabs (Hydrocodone-acetaminophen) .... 1/2 by mouth two times a day as needed severe pain, may cause sleepiness-avoid all alcohol  Complete Medication List: 1)  Adult Aspirin Ec Low Strength 81 Mg Tbec (Aspirin) .... Take 1 tablet by mouth once a day 2)  Carvedilol 3.125 Mg Tabs (Carvedilol) .... One twice a day 3)  Lisinopril 5 Mg Tabs (Lisinopril) .... Take 1 tablet by mouth two times a day 4)  Simvastatin 40  Mg Tabs (Simvastatin) .... Take one tablet by mouth at bedtime 5)  Tricor 145 Mg Tabs (Fenofibrate) .... Take 1 tab by mouth once daily.Marland KitchenMarland Kitchen 6)  Allopurinol 100 Mg Tabs (Allopurinol) .... Take one tablet every day... 7)  Fiberchoice 2 Gm Chew (Inulin) .... Daily 8)  Skelaxin 800 Mg Tabs (Metaxalone) .... 1/2 two times a day as needed muscle spasm, back pain 9)  Vicodin 5-500 Mg Tabs (Hydrocodone-acetaminophen) .... 1/2 by mouth two times a day as needed severe pain, may cause sleepiness-avoid all alcohol  Patient Instructions: 1)  Advil 200mg  2 tabs two times a day for 3 days w/ food. -AVOID ALL ALCOHOL  2)  Skelaxin 800mg  1/2 three times a day as needed back pain.  3)  Vicodin 1/2 tab two times a day as needed severe pain ---these will make you sleepy>>>DO NOT USE WITH ALCOHOL.  4)  I will call with xray results.  5)  Please contact office for sooner follow up if symptoms do not improve or worsen  6)  Heat to low back as needed  Prescriptions: VICODIN 5-500 MG TABS (HYDROCODONE-ACETAMINOPHEN) 1/2 by mouth two times a day as needed severe pain, may cause sleepiness-AVOID ALL ALCOHOL  #10 x 0   Entered and Authorized by:   Rubye Oaks NP   Signed by:   Tammy Parrett NP on 06/16/2010   Method used:   Print then Give to Patient   RxID:   [id number] SKELAXIN 800 MG TABS (METAXALONE) 1/2 two times a day as needed muscle spasm, back pain  #20 x 0   Entered and Authorized by:   Rubye Oaks NP   Signed by:   Tammy Parrett NP on 06/16/2010   Method used:   Electronically to        [facility] [address] (retail)       [address]       [place], [place]  [address]       Ph: [phone] or [phone]       Fax: [phone]   RxID:   [id number]    Immunization History:  Influenza Immunization History:    Influenza:  historical (08/22/2009)

## 2010-11-23 NOTE — Assessment & Plan Note (Signed)
Summary: pc2/medtronic      Allergies Added: NKDA  Visit Type:  Follow-up Primary Provider:  Alroy Dust, [name]   History of Present Illness:  [name] is seen in followup for ischemic heart disease modest depression of LV function with some interval improvement with ejection fraction of 40-45% by echo in April last year  he status post Jesse implantation for primary prevention. There have been no intercurrent discharges.  He has had one episode of syncope; this occurred about 6 months ago. Follow him getting up from his chair to go to the kitchen quickly. There was no premonitory symptoms. He has not had problems with orthostatic lightheadedness notably before or after.  Current Medications (verified): 1)  Adult Aspirin Ec Low Strength 81 Mg  Tbec (Aspirin) .... Take 1 Tablet By Mouth Once A Day 2)  Coreg 6.25 Mg Tabs (Carvedilol) .... Take 1 Tab By Mouth Two Times A Day 3)  Lisinopril 5 Mg  Tabs (Lisinopril) .... Take 1 Tablet By Mouth Two Times A Day 4)  Simvastatin 40 Mg Tabs (Simvastatin) .... Take One Tablet By Mouth At Bedtime 5)  Tricor 145 Mg Tabs (Fenofibrate) .... Take 1 Tab By Mouth Once Daily.Marland KitchenMarland Kitchen 6)  Allopurinol 100 Mg  Tabs (Allopurinol) .... Take One Tablet Every Day... 7)  Fiberchoice 2 Gm Chew (Inulin) .... Daily  Allergies (verified): No Known Drug Allergies  Past History:  Past Medical History: Last updated: 09/23/2009 ALLERGIC RHINITIS (Jesse-477.9) COPD (Jesse-496) TOBACCO ABUSE (Jesse-305.1) HYPERTENSION (Jesse-401.9) ATHEROSCLEROTIC HEART DISEASE (Jesse-414.00) ISCHEMIC CARDIOMYOPATHY (Jesse-414.8) Hx of SYNCOPE (Jesse-780.2) IMPLANTABLE DEFIBRILLATOR, DDD MDT (Jesse-V45.02) PERIPHERAL VASCULAR DISEASE (Jesse-443.9) HYPERCHOLESTEROLEMIA (Jesse-272.0) DIVERTICULOSIS OF COLON (Jesse-562.10) COLONIC POLYPS (Jesse-211.3) HEMORRHOIDS (Jesse-455.6) Hx of LIVER FUNCTION TESTS, ABNORMAL (Jesse-794.8) DEGENERATIVE JOINT DISEASE (Jesse-715.90) Hx of GOUT (Jesse-274.9) BACK PAIN  (Jesse-724.5) COMPRESSION FRACTURE (Jesse-829.0) OSTEOPOROSIS (Jesse-733.00) DERMATITIS (Jesse-692.9)  Vital Signs:  Patient profile:   77 year old male Height:      71 inches Weight:      169 pounds BMI:     23.66 Pulse rate:   68 / minute BP sitting:   92 / 50  (left arm)  Vitals Entered By: [name] CMA (April 07, 2010 3:38 PM)  Physical Exam  General:  The patient was alert and oriented in no acute distress smelling of tobacco smoke HEENT Normal.  Neck veins were flat, carotids were brisk.  Lungs with reasonable air movement but bilateral wheezing Heart sounds were regular without murmurs or gallops.  Abdomen was soft with active bowel sounds. There is no clubbing cyanosis or edema. Skin Warm and dry     Jesse Specifications Following [name]:  Jesse Manges, [name]     Referring [name]:  [name] Jesse Vendor:  Medtronic     Jesse Model Number:  [id number]     Jesse Serial Number:  [id number] H Jesse DOI:  04/13/2008     Jesse Implanting [name]:  Jesse Manges, [name]  Lead 1:    Location: RA     DOI: 04/13/2008     Model #: [id number]     Serial #: [id number]     Status: active Lead 2:    Location: RV     DOI: 04/13/2008     Model #: [id number]     Serial #: [id number]     Status: active  Indications::  ICM   Jesse Follow Up Battery Voltage:  3.15 V     Charge Time:  8.1 seconds     Underlying rhythm:  SR Jesse Dependent:  No       Jesse Device Measurements Atrium:  Amplitude: 2.4 mV, Impedance: 532 ohms, Threshold: 0.50 V at 0.40 msec Right Ventricle:  Amplitude: 4.0 mV, Impedance: 627 ohms, Threshold: 0.25 V at 0.40 msec Shock Impedance: 49/65 ohms   Episodes MS Episodes:  0     Percent Mode Switch:  0     Coumadin:  No Shock:  0     ATP:  0     Nonsustained:  0     Atrial Therapies:  0 Atrial Pacing:  6.7%     Ventricular Pacing:  <0.1%  Brady Parameters Mode MVP     Lower Rate Limit:  60     Upper Rate Limit 130 PAV 200     Sensed AV Delay:  200  Tachy Zones VF:  200     VT:  OFF     Next Remote Date:   07/06/2010     Tech Comments:  NO EPISODES SINCE LAST CHECK ON 10-28-09.  NORMAL DEVICE FUNCTION.  NO CHANGES MADE.  PT TO SEND CARELINK TRANSMISSION 3 MTHS.  [name]  April 07, 2010 4:02 PM  Impression & Recommendations:  Problem # 1:  Hx of SYNCOPE (Jesse-780.2) The patient has had recurrent syncope. This is likely orthostatic in nature. No arrhythmias were identified and his device associated with this episode. As this is a singular event we discussed the pathophysiology and I mentioned that we do have counter orthostatic maneuvers but did not review them extensively given the infrequency of his events His updated medication list for this problem includes:    Adult Aspirin Ec Low Strength 81 Mg Tbec (Aspirin) .Marland Kitchen... Take 1 tablet by mouth once a day    Coreg 6.25 Mg Tabs (Carvedilol) .Marland Kitchen... Take 1 tab by mouth two times a day    Lisinopril 5 Mg Tabs (Lisinopril) .Marland Kitchen... Take 1 tablet by mouth two times a day  Problem # 2:  ISCHEMIC CARDIOMYOPATHY (Jesse-414.8) stable on current meds His updated medication list for this problem includes:    Adult Aspirin Ec Low Strength 81 Mg Tbec (Aspirin) .Marland Kitchen... Take 1 tablet by mouth once a day    Coreg 6.25 Mg Tabs (Carvedilol) .Marland Kitchen... Take 1 tab by mouth two times a day    Lisinopril 5 Mg Tabs (Lisinopril) .Marland Kitchen... Take 1 tablet by mouth two times a day  Problem # 3:  IMPLANTABLE DEFIBRILLATOR, DDD MDT (Jesse-V45.02) Device parameters and data were reviewed and no changes were made  Problem # 4:  TOBACCO ABUSE (Jesse-305.1) Is again encouraged to discontinue his smoking. I reminded him that he has significant COPD associated with wheezing  Patient Instructions: 1)  Your physician wants you to follow-up in:12 MONTHS WITH DR [name].   You will receive a reminder letter in the mail two months in advance. If you don't receive a letter, please call our office to schedule the follow-up appointment. 2)  Your physician recommends that you continue on your current  medications as directed. Please refer to the Current Medication list given to you today.

## 2010-11-23 NOTE — Progress Notes (Signed)
Summary: waiting for refill at pharmacy now > tricor  Phone Note Call from Patient   Caller: Patient Call For: nadel Summary of Call: pt is at [facility]- [facility]. has made a f/u w/ sn but wants nurse to call in his refill asap (while pt is at pharmacy). pt was using pharmacy phone.  Initial call taken by: Tivis Ringer, CNA,  August 17, 2010 4:15 PM  Follow-up for Phone Call        appt made with SN 12.20.11.  pt may have the fenofibrate 160mg  #30 with 1 refill.  no refills unless pt keeps upcoming appt w/ SN.  refills telephoned to pharmacy.  pharmacist stated that she will relay this to the patient since he is waiting at their facility. Boone Master CNA/MA  August 17, 2010 4:23 PM     Prescriptions: TRICOR 145 MG TABS (FENOFIBRATE) take 1 tab by mouth once daily...  #30 x 1   Entered by:   Boone Master CNA/MA   Authorized by:   Michele Mcalpine MD   Signed by:   Boone Master CNA/MA on 08/17/2010   Method used:   Telephoned to ...       [facility] [address] (retail)       [address]       [place], [place]  [address]       Ph: [phone]       Fax: [phone]   RxID:   [id number]

## 2010-11-23 NOTE — Miscellaneous (Signed)
  Clinical Lists Changes  Observations: Added new observation of US CAROTID: Severe irregular plaque, bilaterally with  some shadowing 60-79% bilateral ICA stenosis, appears  f/u 6 months (11/17/2009 11:11)      Carotid Doppler  Procedure date:  11/17/2009  Findings:      Severe irregular plaque, bilaterally with  some shadowing 60-79% bilateral ICA stenosis, appears  f/u 6 months

## 2010-11-23 NOTE — Cardiovascular Report (Signed)
Summary: Certified Letter Signed - Patient (not doing f/u)  Certified Letter Signed - Patient (not doing f/u)   Imported By: Debby Freiberg 11/08/2010 16:53:17  _____________________________________________________________________  External Attachment:    Type:   Image     Comment:   External Document

## 2010-11-23 NOTE — Letter (Signed)
Summary: Device-Delinquent Phone Journalist, newspaper, Main Office  [address]   [place], [place] [address]   Phone: [phone]  Fax: [phone]     October 18, 2010 MRN: [id number]   CHRISOTPHER RIVERO [address] [place], [place]  [address]   Dear Mr. LAROUCHE,  According to our records, you were scheduled for a device phone transmission on 07-06-2010.     We did not receive any results from this check.  If you transmitted on your scheduled day, please call us to help troubleshoot your system.  If you forgot to send your transmission, please send one upon receipt of this letter.  Thank you,  Vella Kohler  October 18, 2010 3:26 PM   [facility] certified

## 2010-11-23 NOTE — Cardiovascular Report (Signed)
Summary: Office Visit   Office Visit   Imported By: Roderic Ovens 04/10/2010 15:24:49  _____________________________________________________________________  External Attachment:    Type:   Image     Comment:   External Document

## 2010-11-23 NOTE — Letter (Signed)
Summary: Device-Delinquent Phone Journalist, newspaper, Main Office  [address]   [place], [place] [address]   Phone: [phone]  Fax: [phone]     July 07, 2010 MRN: [id number]   [name] [address] [place], [place]  [address]   Dear [name],  According to our records, you were scheduled for a device phone transmission on  07-06-2010.     We did not receive any results from this check.  If you transmitted on your scheduled day, please call us to help troubleshoot your system.  If you forgot to send your transmission, please send one upon receipt of this letter.  Thank you,   Architectural [facility]ologist [facility]

## 2010-11-23 NOTE — Miscellaneous (Signed)
Summary: Orders Update  Clinical Lists Changes  Orders: Added new Test order of Carotid Duplex (Carotid Duplex) - Signed 

## 2010-11-23 NOTE — Progress Notes (Signed)
Summary: question re transmission   Phone Note Call from Patient Call back at Home Phone [phone]   Caller: Patient Reason for Call: Talk to Nurse Summary of Call: pt has question re letter abt device phone transmission. pt states he transmitt again this week. pt would like to talk to someone re this issue. Initial call taken by: Roe Coombs,  October 24, 2010 12:43 PM  Follow-up for Phone Call        pt to send transmission and call when transmission is sent to make sure we get it. Vella Kohler  October 24, 2010 2:40 PM  Additional Follow-up for Phone Call Additional follow up Details #1::        thx Additional Follow-up by: Nathen May, MD, [facility],  October 24, 2010 5:40 PM

## 2010-11-23 NOTE — Assessment & Plan Note (Signed)
Summary: 1 yr f/u ///kp   Primary Care Tavarius Grewe:  Alroy Dust, MD  CC:  Yearly ROV & review of mult medical problems....  History of Present Illness: 77 y/o WM here for a follow up visit... he has been followed closely by DrHochrein & DrKlein during the past years due to his cardiomyopathy & AICD...    ~  Oct09:  his wife called today stating that he was having increased back pain and needed to be seen... he has a hx of SYNCOPE 5/09 w/ compression fracture T12... an MRI lumbar spine showed T12 compression and some mild DDD, facet arthropathy, foraminal narrowing,etc... he had a T12 kyphoplasty by Rober Minion 04/14/08 & improved considerably... but he states conrtinued LBP, discomfort esp early in AM, and w/ prolonged standing- to the point that he doesn't want to do much activity because of the pain... BMD showed TScores +0.3 in Spine, & -2.7 in right FemNeck>> seen by Hortense Ramal- no change in Rx, offered PT/ injection if pain worsened... he was supposed to take Caltrate/ MVI/ Vit D...    ~  September 23, 2009:  he had a fall at home 2-3 weeks ago (slipped taking in garbage cans)- right rib trauma, and improving slowly, no exac of LBP etc... he had f/u DrKlein 6/10- pacer OK; and f/u DrHochrein 9/10- tried to incr his Coreg but pt didn't tolerate higher dose... despite everything he continues to smoke cigars regularly... (asked to ret for fasting labs but he never did).   ~  October 10, 2010:  Yearly ROV- doing well, he says w/o new complaints or concerns... states he had recent URI but toughed it out & better now... he saw DrKlein 6/11- f/u ischemic heart dis w/ decr LVF & AICD> noted some postural changes, hx syncope, on-going smoking, AICD was OK & no changes made...  he saw DrHochrein 8/11- cardiomyopathy, EF 40-45% last 2DEcho, syncopal epis related to postural BP changes & he decreased his meds (Coreg3.125Bid & Lisin5Bid) & no prob since then...  he had CDopplers Q6mo w/ 60-79% bilat  ICAstenoses (mod to severe mixed irreg plaque) on ASA daily & no cerebral ischemic symptoms...  he's had some on-going LBP & XRay 8/11 w/o acute changes (prev T12 augmentation, NAD).Marland Kitchen. up to date on his vaccinations and doesn't need refill perscriptions today.   Current Problem List:  COPD (ICD-496) - despite all efforts Ondra continues to smoke 3-4 cigars per day... he has min cough, some phlegm, but denies CP, SOB, wheezing, etc... he does not want inhalers or breathing meds, and doesn't want Chantix or help w/ smoking cessation...  HYPERTENSION (ICD-401.9) - controlled on COREG 3.125mg Bid & LISINOPRIL 5mg Bid... BP= 126/70 and doing well... denies HA, fatigue, visual changes, CP, palipit, dizziness, dyspnea, edema, etc... he has had postural hypotension & several syncopal episodes- now improved w/ the final adjustment in his meds.  ATHEROSCLEROTIC HEART DISEASE (ICD-414.00), & ISCHEMIC CARDIOMYOPATHY (ICD-414.8) - on ASA 81mg /d + above meds... cath in 2003 w/ 2 vessel CAD and stent placed in LAD... cardiolite 1/08 w/ large inferolat infarct, no ischemia, EF=36%... 2DEcho 2/08 w/ infer & post HK, EF=40%... recath 6/09 w/ heavily calcif vessels & 40% EF- DrHochrein has been following carefully and adjusting meds.  ~  9/10: Cards tried to incr the Coreg to 9.375mg Bid but pt intol & went back to 6.25Bid.  ~  8/11:  f/u by DrHochrein w/ recent syncopal episode & meds adjusted coreg 3.125Bid & Lisinopril 5Bid.  Hx of SYNCOPE (ICD-780.2) & IMPLANTABLE  DEFIBRILLATOR, DDD MDT (ICD-V45.02) - AICD placed 2009 by DrKlein for hx syncope & ischemic cardiomyopathy...  followed by DrKlein yearly & doing satis.  PERIPHERAL VASCULAR DISEASE (ICD-443.9) - on ASA 81mg /d... he is s/p AAA repair 2000 by [facility]... he is sedentary and hasn't had ABI's checked... I will leave this to DrHochrein...  ~  CDopplers 7/10, 1/1,1 & 8/11 showed stable mod carotid dis bilat w/ heavy calcif plaque & 60-79% bilat ICA  stenoses...  HYPERCHOLESTEROLEMIA (ICD-272.0) - on SIMVASTATIN 40mg /d & TRICOR 145/d.  ~  FLP 4/08 showed TChol 131, TG 100, HDL 41, LDL 70  ~  FLP 2/09 showed TChol 124, TG 132, HDL 37, LDL 61  ~  FLP 12/10 > pt never ret for FLP & insurance changed Vytorin to [facility].  ~  FLP 12/11 showed TChol 112, TG 51, HDL 43, LDL 59  DIVERTICULOSIS OF COLON (ICD-562.10),  COLONIC POLYPS (ICD-211.3),  & HEMORRHOIDS (ICD-455.6) - last colonoscopy was 11/06 by DrPatterson showing divertics, several 1-13mm polyps (hyperplastic), and hems...  Hx of LIVER FUNCTION TESTS, ABNORMAL (ICD-794.8) - improved off etoh...  ~  labs 2/09 showed SGOT= 30, SGPT= 17  ~  labs 12/11 showed SGOT= 38, SGPT= 19  DEGENERATIVE JOINT DISEASE (ICD-715.90) & Hx of GOUT (ICD-274.9) - on ALLOPURINOL 100mg /d...  ~  labs 2/09 showed Uric= 3.3  OSTEOPOROSIS (ICD-733.00) - on Caltrate, MVI, Vit D... he had T12 compression after syncopal spell 2009 w/ vertebroplasty by DrDeveshwar...  BMD here 10/09 showed TScores +0.3 in Spine, & -2.7 in right FemNeck (Ortho eval by Janett Billow)...  ~  labs 12/11 showed Vit D level = 22... rec to take Men's MVI + Vit D 2000 u daily.  DERMATITIS (ICD-692.9)  Health Maintenance -   ~  GI: colonoscopy 11/06 w/ several hyperplastic polyps removed...  ~  GU: PSA 12/11 = 1.71  ~  Immunizations:  he tells me that he had PNEUMOVAX & 2010 Flu shot 10/10... received TETANUS shot here 2003...   Preventive Screening-Counseling & Management  Alcohol-Tobacco     Smoking Status: current     Year Started: age 70  Comments: smokes about 5 cigars weekly x 54 years  Allergies (verified): No Known Drug Allergies  Comments:  Nurse/Medical Assistant: The patient's medications and allergies were reviewed with the patient and were updated in the Medication and Allergy Lists.  Past History:  Past Medical History: ALLERGIC RHINITIS (ICD-477.9) COPD (ICD-496) TOBACCO ABUSE (ICD-305.1) HYPERTENSION  (ICD-401.9) ATHEROSCLEROTIC HEART DISEASE (ICD-414.00) ISCHEMIC CARDIOMYOPATHY (ICD-414.8) Hx of SYNCOPE (ICD-780.2) IMPLANTABLE DEFIBRILLATOR, DDD MDT (ICD-V45.02) PERIPHERAL VASCULAR DISEASE (ICD-443.9) HYPERCHOLESTEROLEMIA (ICD-272.0) DIVERTICULOSIS OF COLON (ICD-562.10) COLONIC POLYPS (ICD-211.3) HEMORRHOIDS (ICD-455.6) Hx of LIVER FUNCTION TESTS, ABNORMAL (ICD-794.8) DEGENERATIVE JOINT DISEASE (ICD-715.90) Hx of GOUT (ICD-274.9) BACK PAIN (ICD-724.5) COMPRESSION FRACTURE (ICD-829.0) OSTEOPOROSIS (ICD-733.00) DERMATITIS (ICD-692.9)  Past Surgical History: S/P AAA repair 2000 by [facility]  Family History: Reviewed history from 10/03/2008 and no changes required. Noncontributory for early coronary artery disease  Social History: Reviewed history from 10/03/2008 and no changes required. The patient is married.  He has 3 children.  He is a  retired Clinical research associate.  Smokes 5 cigars, has smoked for 54 years  Review of Systems       The patient complains of dyspnea on exertion and arthritis.  The patient denies fever, chills, sweats, anorexia, fatigue, weakness, malaise, weight loss, sleep disorder, blurring, diplopia, eye irritation, eye discharge, vision loss, eye pain, photophobia, earache, ear discharge, tinnitus, decreased hearing, nasal congestion, nosebleeds, sore throat, hoarseness, chest pain,  palpitations, orthopnea, PND, peripheral edema, cough, dyspnea at rest, excessive sputum, hemoptysis, wheezing, pleurisy, nausea, vomiting, diarrhea, constipation, change in bowel habits, abdominal pain, melena, hematochezia, jaundice, gas/bloating, indigestion/heartburn, dysphagia, odynophagia, dysuria, hematuria, urinary frequency, urinary hesitancy, nocturia, incontinence, back pain, joint pain, joint swelling, muscle cramps, muscle weakness, stiffness, sciatica, restless legs, leg pain at night, leg pain with exertion, rash, itching, dryness, suspicious lesions, paralysis, paresthesias,  seizures, tremors, vertigo, transient blindness, frequent falls, frequent headaches, difficulty walking, depression, anxiety, memory loss, confusion, cold intolerance, heat intolerance, polydipsia, polyphagia, polyuria, unusual weight change, abnormal bruising, bleeding, enlarged lymph nodes, urticaria, allergic rash, hay fever, and recurrent infections.    Vital Signs:  Patient profile:   77 year old male Height:      71 inches Weight:      170.25 pounds BMI:     23.83 O2 Sat:      100 % on Room air Temp:     96.9 degrees F oral Pulse rate:   62 / minute BP sitting:   126 / 70  (right arm) Cuff size:   regular  Vitals Entered By: Randell Loop CMA (October 10, 2010 12:05 PM)  O2 Sat at Rest %:  100 O2 Flow:  Room air CC: Yearly ROV & review of mult medical problems... Is Patient Diabetic? No Pain Assessment Patient in pain? no      Comments meds updated today with pt   Physical Exam  Additional Exam:  WD, WN, 77 y/o WM in NAD... GENERAL:  Alert & oriented; pleasant & cooperative... HEENT:  [place]/AT, EOM-full, PERRLA, EACs-clear  NOSE-clear, THROAT-clear & wnl. NECK:  Supple w/ fairROM; no JVD; normal carotid impulses w/o bruits; no thyromegaly or nodules palpated; no lymphadenopathy. CHEST:  bilat rhonchi at bases & end-exp wheezing,  no rales, no signs of consolidation... HEART:  Regular Rhythm; gr 1/6 SEM, S4, no rubs... ABDOMEN:  Soft & nontender; normal bowel sounds; no organomegaly or masses detected. EXT: without deformities, mild arthritic changes; no varicose veins/ +venous insuffic/ no edema. NEURO: no focal neuro deficits... DERM:  dry skin dermatitis, seborrhea, rosacea...    CXR  Procedure date:  10/10/2010  Findings:      CHEST - 2 VIEW Comparison: Chest x-ray of 04/30/2008   Findings: The lungs remain clear.  Mediastinal contours are stable. The heart is mildly enlarged and stable.  A permanent pacemaker with AICD lead remains.  There are degenerative  changes in the lower thoracic spine and a compressed lower thoracic vertebral body appears stable.   IMPRESSION: Stable chest x-ray with slight hyperaeration.  No active lung disease.  Permanent pacemaker with AICD lead remains.   Read By:  Juline Patch,  M.D.   MISC. Report  Procedure date:  10/11/2010  Findings:      Lipid Panel (LIPID)   Cholesterol               112 mg/dL                   2-993   Triglycerides             51.0 mg/dL                  7.1-696.7   HDL                       89.38 mg/dL                 >10.17  LDL Cholesterol           59 mg/dL                    1-61  BMP (METABOL)   Sodium               [L]  131 mEq/L                   135-145   Potassium                 4.5 mEq/L                   3.5-5.1   Chloride             [L]  95 mEq/L                    96-112   Carbon Dioxide            28 mEq/L                    19-32   Glucose              [L]  60 mg/dL                    09-60   BUN                       12 mg/dL                    4-54   Creatinine                1.1 mg/dL                   0.9-8.1   Calcium                   9.3 mg/dL                   1.9-14.7   GFR                       72.68 mL/min                >60.00  Hepatic/Liver Function Panel (HEPATIC)   Total Bilirubin           1.1 mg/dL                   8.2-9.5   Direct Bilirubin          0.2 mg/dL                   6.2-1.3   Alkaline Phosphatase [L]  25 U/L                      39-117   AST                  [H]  38 U/L                      0-37   ALT                       19 U/L  0-53   Total Protein             6.6 g/dL                    5.4-0.9   Albumin                   3.7 g/dL                    8.1-1.9  Comments:      CBC Platelet w/Diff (CBCD)   White Cell Count          5.5 K/uL                    4.5-10.5   Red Cell Count       [L]  3.61 Mil/uL                 4.22-5.81   Hemoglobin                13.4 g/dL                   14.7-82.9    Hematocrit           [L]  38.0 %                      39.0-52.0   MCV                  [H]  105.2 fl                    78.0-100.0   Platelet Count       [L]  113.0 K/uL                  150.0-400.0   Neutrophil %              51.4 %                      43.0-77.0   Lymphocyte %              21.6 %                      12.0-46.0   Monocyte %                7.6 %                       3.0-12.0   Eosinophils%         [H]  18.8 %                      0.0-5.0   Basophils %               0.6 %                       0.0-3.0  TSH (TSH)   FastTSH                   1.79 uIU/mL                 0.35-5.50  Prostate Specific Antigen (PSA)   PSA-Hyb                   1.71 ng/mL  0.10-4.00  Vitamin D (25-Hydroxy)                        [L]  22 ng/mL                    30-89   Impression & Recommendations:  Problem # 1:  COPD (ICD-496) He denies breathing difficulty, notes some cough etc... he refuses meds inhalers etc... refuses smoking cessation help, Chantix, etc.. Orders: T-2 View CXR ([id number])  Problem # 2:  HYPERTENSION (ICD-401.9) Controlled>  continue current meds, watch for post hypotension... His updated medication list for this problem includes:    Carvedilol 3.125 Mg Tabs (Carvedilol) ..... One twice a day    Lisinopril 5 Mg Tabs (Lisinopril) .Marland Kitchen... Take 1 tablet by mouth two times a day  Problem # 3:  ISCHEMIC CARDIOMYOPATHY (ICD-414.8) Followed by DrHochrein> continue current meds... His updated medication list for this problem includes:    Adult Aspirin Ec Low Strength 81 Mg Tbec (Aspirin) .Marland Kitchen... Take 1 tablet by mouth once a day    Carvedilol 3.125 Mg Tabs (Carvedilol) ..... One twice a day    Lisinopril 5 Mg Tabs (Lisinopril) .Marland Kitchen... Take 1 tablet by mouth two times a day  Problem # 4:  IMPLANTABLE DEFIBRILLATOR, DDD MDT (ICD-V45.02) Followed by DrKlein, stable...  Problem # 5:  PERIPHERAL VASCULAR DISEASE (ICD-443.9) CDopplers stable> followed Q55mo by [facility]  Cards...  Problem # 6:  HYPERCHOLESTEROLEMIA (ICD-272.0) FLP looks great on these meds... His updated medication list for this problem includes:    Simvastatin 40 Mg Tabs (Simvastatin) .Marland Kitchen... Take one tablet by mouth at bedtime    Tricor 145 Mg Tabs (Fenofibrate) .Marland Kitchen... Take 1 tab by mouth once daily...  Problem # 7:  DIVERTICULOSIS OF COLON (ICD-562.10) GI stable and up to date...  Problem # 8:  COMPRESSION FRACTURE (ICD-829.0) T12 compression is stable> no additional fxs noted...  Problem # 9:  OSTEOPOROSIS (ICD-733.00) Vit D level is sl low... rec MVI + Vit D 2000 u daily...  Problem # 10:  OTHER MEDICAL PROBLEMS AS NOTED>>> Vaccines are up to date...  Complete Medication List: 1)  Adult Aspirin Ec Low Strength 81 Mg Tbec (Aspirin) .... Take 1 tablet by mouth once a day 2)  Carvedilol 3.125 Mg Tabs (Carvedilol) .... One twice a day 3)  Lisinopril 5 Mg Tabs (Lisinopril) .... Take 1 tablet by mouth two times a day 4)  Simvastatin 40 Mg Tabs (Simvastatin) .... Take one tablet by mouth at bedtime 5)  Tricor 145 Mg Tabs (Fenofibrate) .... Take 1 tab by mouth once daily.Marland KitchenMarland Kitchen 6)  Allopurinol 100 Mg Tabs (Allopurinol) .... Take one tablet every day... 7)  Fiberchoice 2 Gm Chew (Inulin) .... Daily  Patient Instructions: 1)  Today we updated your med list- see below.... 2)  Continue your current meds the same for now... 3)  Today we did your follow up CXR.Marland KitchenMarland Kitchen 4)  Please return to our lab in the AM for your FASTING blood work...  then please call the "phone tree" in a few days for your lab results.Marland KitchenMarland Kitchen  5)  Try to increase your exercise program & try to decrease the cigar smoking... 6)  Call for any problems or concerns... 7)  Please schedule a follow-up appointment in 1 year, sooner as needed.   Immunization History:  Influenza Immunization History:    Influenza:  historical (09/04/2010)  Pneumovax Immunization History:    Pneumovax:  historical (07/12/2009)

## 2010-11-23 NOTE — Procedures (Signed)
Summary: pcp/kfw      Allergies Added: NKDA  Current Medications (verified): 1)  Adult Aspirin Ec Low Strength 81 Mg  Tbec (Aspirin) .... Take 1 Tablet By Mouth Once A Day 2)  Coreg 6.25 Mg Tabs (Carvedilol) .... Take 1 Tab By Mouth Two Times A Day 3)  Lisinopril 5 Mg  Tabs (Lisinopril) .... Take 1 Tablet By Mouth Two Times A Day 4)  Vytorin 10-20 Mg Tabs (Ezetimibe-Simvastatin) .... Take 1 Tab By Mouth At Bedtime.Marland KitchenMarland Kitchen 5)  Tricor 145 Mg Tabs (Fenofibrate) .... Take 1 Tab By Mouth Once Daily.Marland KitchenMarland Kitchen 6)  Allopurinol 100 Mg  Tabs (Allopurinol) .... Take One Tablet Every Day... 7)  Fiberchoice 2 Gm Chew (Inulin) .... Daily  Allergies (verified): No Known Drug Allergies   ICD Specifications Following MD:  Sherryl Manges, MD     Referring MD:  [facility] ICD Vendor:  [employer]     ICD Model Number:  [id number]     ICD Serial Number:  [id number] ICD DOI:  04/13/2008     ICD Implanting MD:  Sherryl Manges, MD  Lead 1:    Location: RA     DOI: 04/13/2008     Model #: [id number]     Serial #: [id number]     Status: active Lead 2:    Location: RV     DOI: 04/13/2008     Model #: [id number]     Serial #: [id number]     Status: active  Indications::  ICM   ICD Follow Up Remote Check?  No Battery Voltage:  3.15 V     Charge Time:  8.1 seconds     Underlying rhythm:  SR ICD Dependent:  No       ICD Device Measurements Atrium:  Amplitude: 2.9 mV, Impedance: 532 ohms, Threshold: 0.75 V at 0.4 msec Right Ventricle:  Amplitude: 3.3 mV, Impedance: 551 ohms, Threshold: 0.75 V at 0.4 msec Shock Impedance: 48/63 ohms   Episodes MS Episodes:  0     Percent Mode Switch:  0     Coumadin:  No Shock:  0     ATP:  0     Nonsustained:  0     Atrial Pacing:  9.7%     Ventricular Pacing:  0.1%  Brady Parameters Mode MVP     Lower Rate Limit:  60     Upper Rate Limit 130 PAV 200     Sensed AV Delay:  200  Tachy Zones VF:  200     VT:  OFF     Next Remote Date:  01/24/2010     Next Cardiology Appt Due:  03/22/2010 Tech  Comments:  No parameter changes.  Device function normal.  I re-instructed him on [facility] transmissions, to be sent every 3 months.  ROV 6/11 with Dr. Graciela Husbands. Altha Harm, LPN  October 27, 2009 12:49 PM

## 2010-11-23 NOTE — Progress Notes (Signed)
Summary: rx not covered- needs sub  Phone Note Call from Patient Call back at Home Phone [phone]   Caller: Patient Call For: Ayman Brull Summary of Call: pt states that his ins will no longer cover vytorin and he needs a sub for this (generic if possible). [facility] [address] Initial call taken by: Tivis Ringer, CNA,  November 15, 2009 12:34 PM  Follow-up for Phone Call        please advise.  thank you.  Aundra Millet Reynolds LPN  November 15, 2009 12:40 PM    called and spoke with pt and he is aware of vytorin has been changed to simvastatin 40mg ---this has been sent into the pharmacy for the pt---SN rec that pt call in 2-3 months for recheck of labs on the simvastatin--pt voiced his understanding and will call at that time for these Randell Loop CMA  November 15, 2009 1:51 PM     New/Updated Medications: SIMVASTATIN 40 MG TABS (SIMVASTATIN) take one tablet by mouth at bedtime Prescriptions: SIMVASTATIN 40 MG TABS (SIMVASTATIN) take one tablet by mouth at bedtime  #90 x 6   Entered by:   Randell Loop CMA   Authorized by:   Michele Mcalpine MD   Signed by:   Randell Loop CMA on 11/15/2009   Method used:   Electronically to        [facility] [address]* (retail)       [address]       [place], [place]  [address]       Ph: [phone] or [phone]       Fax: [phone]   RxID:   [id number]

## 2010-11-23 NOTE — Progress Notes (Signed)
Summary: prescription  Phone Note Call from Patient   Caller: Patient Call For: dr Kriste Basque Summary of Call: patient phoned would like for Dr. Kriste Basque change his Tricor from the brand to the generic. Patient can be reached at [phone] Pt uses [facility] on [address] Initial call taken by: Vedia Coffer,  October 24, 2010 1:53 PM  Follow-up for Phone Call        pt would like to change the tricor 145 to fenofibrate 134  since this is a generic and would be more affordable pls advise if ok to change and i will call to the pharmacy Follow-up by: Philipp Deputy CMA,  October 24, 2010 4:13 PM  Additional Follow-up for Phone Call Additional follow up Details #1::        per sn ok to change med called to pharmacy and pt aware Additional Follow-up by: Philipp Deputy CMA,  October 24, 2010 4:17 PM    New/Updated Medications: FENOFIBRATE MICRONIZED 134 MG CAPS (FENOFIBRATE MICRONIZED) 1 by mouth once daily Prescriptions: FENOFIBRATE MICRONIZED 134 MG CAPS (FENOFIBRATE MICRONIZED) 1 by mouth once daily  #30 x 11   Entered by:   Philipp Deputy CMA   Authorized by:   Michele Mcalpine MD   Signed by:   Philipp Deputy CMA on 10/24/2010   Method used:   Electronically to        [facility]* (retail)       [address]       [place], [place]  [address]       Ph: [phone]       Fax: [phone]   RxID:   [id number]

## 2010-11-23 NOTE — Assessment & Plan Note (Signed)
Summary: rov  Medications Added CARVEDILOL 3.125 MG TABS (CARVEDILOL) one twice a day      Allergies Added: NKDA  Visit Type:  Follow-up Primary Provider:  Alroy Dust, MD  CC:  Cardiomyopathy.  History of Present Illness: The patient presents for followup of his cardiomyopathy. He said about 2 weeks ago he was standing in his living room and he had a frank syncopal episode. He said afterwards his blood pressure was 70/38. He reduced his Coreg to once daily and his lisinopril to once daily on his own. He did not have any palpitations or obvious firing of his defibrillator. Since then he has felt well and has had no further presyncope or syncope. He has had no further chest pressure, neck or arm discomfort. He denies any shortness of breath, PND or orthopnea.  Current Medications (verified): 1)  Adult Aspirin Ec Low Strength 81 Mg  Tbec (Aspirin) .... Take 1 Tablet By Mouth Once A Day 2)  Coreg 6.25 Mg Tabs (Carvedilol) .Marland Kitchen.. 1 By Mouth Daily 3)  Lisinopril 5 Mg  Tabs (Lisinopril) .... Take 1 Tablet By Mouth Daily 4)  Simvastatin 40 Mg Tabs (Simvastatin) .... Take One Tablet By Mouth At Bedtime 5)  Tricor 145 Mg Tabs (Fenofibrate) .... Take 1 Tab By Mouth Once Daily.Marland KitchenMarland Kitchen 6)  Allopurinol 100 Mg  Tabs (Allopurinol) .... Take One Tablet Every Day... 7)  Fiberchoice 2 Gm Chew (Inulin) .... Daily  Allergies (verified): No Known Drug Allergies  Past History:  Past Medical History: Reviewed history from 09/23/2009 and no changes required. ALLERGIC RHINITIS (ICD-477.9) COPD (ICD-496) TOBACCO ABUSE (ICD-305.1) HYPERTENSION (ICD-401.9) ATHEROSCLEROTIC HEART DISEASE (ICD-414.00) ISCHEMIC CARDIOMYOPATHY (ICD-414.8) Hx of SYNCOPE (ICD-780.2) IMPLANTABLE DEFIBRILLATOR, DDD MDT (ICD-V45.02) PERIPHERAL VASCULAR DISEASE (ICD-443.9) HYPERCHOLESTEROLEMIA (ICD-272.0) DIVERTICULOSIS OF COLON (ICD-562.10) COLONIC POLYPS (ICD-211.3) HEMORRHOIDS (ICD-455.6) Hx of LIVER FUNCTION TESTS, ABNORMAL  (ICD-794.8) DEGENERATIVE JOINT DISEASE (ICD-715.90) Hx of GOUT (ICD-274.9) BACK PAIN (ICD-724.5) COMPRESSION FRACTURE (ICD-829.0) OSTEOPOROSIS (ICD-733.00) DERMATITIS (ICD-692.9)  Past Surgical History: Reviewed history from 09/23/2009 and no changes required. S/P AAA repair 2000 by Windell Moulding  Review of Systems       As stated in the HPI and negative for all other systems.   Vital Signs:  Patient profile:   77 year old male Height:      71 inches Weight:      168 pounds BMI:     23.52 Pulse rate:   72 / minute Resp:     16 per minute BP sitting:   142 / 72  (right arm)  Vitals Entered By: Marrion Coy, CNA (June 02, 2010 2:43 PM)  Physical Exam  General:  Well developed, well nourished, in no acute distress.unkept.  unkept.   Head:  normocephalic and atraumatic Eyes:  PERRLA/EOM intact; conjunctiva and lids normal. Neck:  Neck supple, no JVD. No masses, thyromegaly or abnormal cervical nodes. Chest Wall:  Well healed ICD scar Lungs:  Few expiratory wheezes, no crackles Abdomen:  Bowel sounds positive; abdomen soft and non-tender without masses, organomegaly, or hernias noted. No hepatosplenomegaly. Msk:  Back normal, normal gait. Muscle strength and tone normal. Extremities:  No clubbing or cyanosis. Neurologic:  Alert and oriented x 3. Skin:  Intact without lesions or rashes. Cervical Nodes:  no significant adenopathy Inguinal Nodes:  no significant adenopathy Psych:  Normal affect.   Detailed Cardiovascular Exam  Neck    Carotids: Carotids full and equal bilaterally without bruits.      Neck Veins: Normal, no JVD.    Heart  Inspection: no deformities or lifts noted.      Palpation: normal PMI with no thrills palpable.      Auscultation: regular rate and rhythm, S1, S2 without murmurs, rubs, gallops, or clicks.    Vascular    Abdominal Aorta: no palpable masses, pulsations, or audible bruits.      Femoral Pulses: normal femoral pulses bilaterally.       Pedal Pulses: normal pedal pulses bilaterally.      Radial Pulses: normal radial pulses bilaterally.      Peripheral Circulation: no clubbing, cyanosis, or edema noted with normal capillary refill.     EKG  Procedure date:  06/02/2010  Findings:      sinus rhythm, rate 70 to, right bundle branch block, old inferior infarct, QTC prolonged   ICD Specifications Following MD:  Sherryl Manges, MD     Referring MD:  [facility] ICD Vendor:  [employer]     ICD Model Number:  [id number]     ICD Serial Number:  [id number] ICD DOI:  04/13/2008     ICD Implanting MD:  Sherryl Manges, MD  Lead 1:    Location: RA     DOI: 04/13/2008     Model #: [id number]     Serial #: [id number]     Status: active Lead 2:    Location: RV     DOI: 04/13/2008     Model #: [id number]     Serial #: [id number]     Status: active  Indications::  ICM   ICD Follow Up ICD Dependent:  No      Episodes Coumadin:  No  Brady Parameters Mode MVP     Lower Rate Limit:  60     Upper Rate Limit 130 PAV 200     Sensed AV Delay:  200  Tachy Zones VF:  200     VT:  OFF     Impression & Recommendations:  Problem # 1:  CAROTID ARTERY DISEASE (ICD-433.10) He had a carotid Doppler today. This demonstrates 60-79% bilaterally and he is to have this followed again in 6 months.  Problem # 2:  SYNCOPE (ICD-780.2) He had the episode as described. This was a transient low blood pressure. He has some lifestyle issues that may have contributed to chart note. He's not had any further episodes. I do not believe this to be a dysrhythmia. No change in therapy is indicated.  Problem # 3:  TOBACCO ABUSE (ICD-305.1) He understands the need to stop smoking all tobacco.  Problem # 4:  ISCHEMIC CARDIOMYOPATHY (ICD-414.8) I will titrate his meds. I think he will be able to tolerate his lisinopril 5 b.i.d. I changed his carvedilol to 3.125 mg twice daily.  Other Orders: EKG w/ Interpretation (93000)  Patient Instructions: 1)  Your physician recommends that  you schedule a follow-up appointment in: 6 months with Dr Antoine Poche 2)  Your physician has recommended you make the following change in your medication: Increase 3.125 mg one twice a day and Lisinopril 5 mg twice a day Prescriptions: CARVEDILOL 3.125 MG TABS (CARVEDILOL) one twice a day  #60 x 11   Entered by:   Charolotte Capuchin, RN   Authorized by:   [name], MD, [facility]   Signed by:   Charolotte Capuchin, RN on 06/02/2010   Method used:   Electronically to        [facility] [phone]* (retail)       [address]  [place], [place]  [address]       Ph: [phone] or [phone]       Fax: [phone]   RxID:   [id number]  I have reviewed and approved all prescriptions at the time of this visit. [name], MD, [facility]  June 02, 2010 3:27 PM

## 2010-11-23 NOTE — Cardiovascular Report (Signed)
Summary: Office Visit   Office Visit   Imported By: Roderic Ovens 11/03/2009 11:59:29  _____________________________________________________________________  External Attachment:    Type:   Image     Comment:   External Document

## 2010-11-23 NOTE — Progress Notes (Signed)
Summary: skelaxin too expensive, okay to change to flexeril per TP  Phone Note Refill Request Message from:  Fax from Pharmacy on June 20, 2010 5:09 PM  Refills Requested: Medication #1:  SKELAXIN 800 MG TABS 1/2 two times a day as needed muscle spasm fax from pharmacy requests another generic b/c skelaxin that TP sent at 06-16-10 office visit has a copay of $75.  per TP, okay to change to generic flexeril 5mg  1 tab two times a day as needed #30, no refills.  this has been written on the request from the pharmacy and faxed back to the [facility] on [address] Boone Master CNA/MA  June 20, 2010 5:12 PM      New/Updated Medications: FLEXERIL 5 MG TABS (CYCLOBENZAPRINE HCL) Take 1 tablet by mouth two times a day as needed Prescriptions: FLEXERIL 5 MG TABS (CYCLOBENZAPRINE HCL) Take 1 tablet by mouth two times a day as needed  #30 x 0   Entered by:   Boone Master CNA/MA   Authorized by:   Rubye Oaks NP   Signed by:   Boone Master CNA/MA on 06/20/2010   Method used:   Historical   RxID:   [id number]   Appended Document: flexeril changed to parafon forte Medications Added PARAFON FORTE DSC 500 MG TABS (CHLORZOXAZONE) take one tablet by mouth two times a day as needed for muscle spasms          Clinical Lists Changes  Medications: Changed medication from FLEXERIL 5 MG TABS (CYCLOBENZAPRINE HCL) Take 1 tablet by mouth two times a day as needed to PARAFON FORTE DSC 500 MG TABS (CHLORZOXAZONE) take one tablet by mouth two times a day as needed for muscle spasms - Signed Rx of PARAFON FORTE DSC 500 MG TABS (CHLORZOXAZONE) take one tablet by mouth two times a day as needed for muscle spasms;  #50 x 5;  Signed;  Entered by: Randell Loop CMA;  Authorized by: Michele Mcalpine MD;  Method used: Electronically to [facility] [address] [phone]*, [address] [place], [place], [place]  [address], Ph: [phone] or [phone], Fax: [phone]    Prescriptions: PARAFON FORTE  DSC 500 MG TABS (CHLORZOXAZONE) take one tablet by mouth two times a day as needed for muscle spasms  #50 x 5   Entered by:   Randell Loop CMA   Authorized by:   Michele Mcalpine MD   Signed by:   Randell Loop CMA on 06/21/2010   Method used:   Electronically to        [facility] [address] L[facility]y [facility]* (retail)       [address]       [place], [place]  [address]       Ph: [phone] or [phone]       Fax: [phone]   RxID:   [id number]

## 2010-12-06 ENCOUNTER — Encounter: Payer: Self-pay | Admitting: Internal Medicine

## 2010-12-08 ENCOUNTER — Encounter (INDEPENDENT_AMBULATORY_CARE_PROVIDER_SITE_OTHER): Payer: Medicare Other

## 2010-12-08 ENCOUNTER — Encounter: Payer: Self-pay | Admitting: Cardiology

## 2010-12-08 DIAGNOSIS — I6529 Occlusion and stenosis of unspecified carotid artery: Secondary | ICD-10-CM

## 2010-12-12 ENCOUNTER — Telehealth: Payer: Self-pay | Admitting: Cardiology

## 2010-12-13 ENCOUNTER — Encounter: Payer: Self-pay | Admitting: Cardiology

## 2010-12-13 NOTE — Miscellaneous (Signed)
Summary: Orders Update  Clinical Lists Changes  Orders: Added new Test order of Carotid Duplex (Carotid Duplex) - Signed 

## 2010-12-14 ENCOUNTER — Ambulatory Visit: Payer: BLUE CROSS/BLUE SHIELD | Admitting: Physician Assistant

## 2010-12-15 ENCOUNTER — Telehealth: Payer: Self-pay | Admitting: Internal Medicine

## 2010-12-15 ENCOUNTER — Encounter (INDEPENDENT_AMBULATORY_CARE_PROVIDER_SITE_OTHER): Payer: Self-pay | Admitting: *Deleted

## 2010-12-19 NOTE — Progress Notes (Signed)
Summary: pt daughter has question re pt condition   Phone Note Call from Patient Call back at [phone]   Caller: Daughter/cindy Reason for Call: Talk to Nurse Summary of Call: pt daughter has question re pt condition and procedure scheduled. Initial call taken by: Roe Coombs,  December 15, 2010 1:02 PM  Follow-up for Phone Call        phone number incorrect - called and spoke with pt's wife who states she thinks Cinday has all the information she needs at this point.  She will make sure Arline Asp is aware I tried to contact her Follow-up by: Charolotte Capuchin, RN,  December 15, 2010 3:01 PM

## 2010-12-19 NOTE — Progress Notes (Signed)
Summary: pt wondering where we are with referral   Phone Note Call from Patient Call back at Home Phone [phone]   Caller: Patient Reason for Call: Talk to Nurse, Talk to Doctor Summary of Call: pt is concerned because he was suppose to get a call about a referral and he hasn't heard anything  Initial call taken by: Omer Adriann,  December 12, 2010 2:38 PM  Follow-up for Phone Call        pt daughter cindy states her father was told his test were bad (stroke waiting to happen). pt daughter does not understand why her father was not called or schedule for the next step.  pt and daughter want to talk to a nurse.   pt # [phone] pt daughter #  [phone] Follow-up by: Roe Coombs,  December 12, 2010 3:06 PM  Additional Follow-up for Phone Call Additional follow up Details #1::        Phone Call Completed SPOKE WITH PT INFORMED HE IS DUE TO SEE DR Kayler Buckholtz FOR 6 MO F/U  WILL SCHEDULE F/U TO DISCUSS ABN CAROTID APPT MADE WITH SCOT [name] AT 11:30 AM  ON 12/14/10 Additional Follow-up by: Scherrie Bateman, LPN,  December 12, 2010 4:52 PM    Additional Follow-up for Phone Call Additional follow up Details #2::    We can schedule the patient to see vascular surgery for continued follow up or further discussions concerning surgery. Follow-up by: [name], MD, [facility],  December 12, 2010 9:04 PM  Additional Follow-up for Phone Call Additional follow up Details #3:: Details for Additional Follow-up Action Taken: Order placed for pt to be scheduled with [facility] for carotid stenosis. Additional Follow-up by: Charolotte Capuchin, RN,  December 13, 2010 10:30 AM

## 2010-12-19 NOTE — Miscellaneous (Signed)
Summary: order for referral to [facility]  Clinical Lists Changes  Orders: Added new Referral order of [facility] Referral ([facility] Ref) - Signed

## 2010-12-19 NOTE — Letter (Signed)
Summary: Appointment - Missed  [facility], Main Office  [address]   [place], [place] [address]   Phone: [phone]  Fax: [phone]     December 15, 2010 MRN: [id number]   [facility] [address] [place], [place]  [address]   Dear Mr. DEBROSSE,  Our records indicate you missed your appointment on 2-23.12  with [facility]. It is very important that we reach you to reschedule this appointment. We look forward to participating in your health care needs. Please contact us at the number listed above at your earliest convenience to reschedule this appointment.     Sincerely,    Glass blower/designer

## 2010-12-21 HISTORY — PX: OTHER SURGICAL HISTORY: SHX169

## 2010-12-27 ENCOUNTER — Encounter (INDEPENDENT_AMBULATORY_CARE_PROVIDER_SITE_OTHER): Payer: Medicare Other | Admitting: Vascular Surgery

## 2010-12-27 DIAGNOSIS — I6529 Occlusion and stenosis of unspecified carotid artery: Secondary | ICD-10-CM

## 2010-12-29 ENCOUNTER — Other Ambulatory Visit: Payer: Self-pay | Admitting: Vascular Surgery

## 2010-12-29 ENCOUNTER — Encounter (HOSPITAL_COMMUNITY)
Admission: RE | Admit: 2010-12-29 | Discharge: 2010-12-29 | Disposition: A | Discharge status: Home / Self Care | Payer: Medicare Other | Source: Ambulatory Visit | Attending: Vascular Surgery | Admitting: Vascular Surgery

## 2010-12-29 ENCOUNTER — Ambulatory Visit (HOSPITAL_COMMUNITY)
Admission: RE | Admit: 2010-12-29 | Discharge: 2010-12-29 | Disposition: A | Discharge status: Home / Self Care | Payer: Medicare Other | Source: Ambulatory Visit | Attending: Vascular Surgery | Admitting: Vascular Surgery

## 2010-12-29 DIAGNOSIS — I6529 Occlusion and stenosis of unspecified carotid artery: Secondary | ICD-10-CM | POA: Insufficient documentation

## 2010-12-29 DIAGNOSIS — I6521 Occlusion and stenosis of right carotid artery: Secondary | ICD-10-CM

## 2010-12-29 DIAGNOSIS — Z01810 Encounter for preprocedural cardiovascular examination: Secondary | ICD-10-CM | POA: Insufficient documentation

## 2010-12-29 DIAGNOSIS — Z01818 Encounter for other preprocedural examination: Secondary | ICD-10-CM | POA: Insufficient documentation

## 2010-12-29 DIAGNOSIS — J4489 Other specified chronic obstructive pulmonary disease: Secondary | ICD-10-CM | POA: Insufficient documentation

## 2010-12-29 DIAGNOSIS — Z01812 Encounter for preprocedural laboratory examination: Secondary | ICD-10-CM | POA: Insufficient documentation

## 2010-12-29 DIAGNOSIS — J449 Chronic obstructive pulmonary disease, unspecified: Secondary | ICD-10-CM | POA: Insufficient documentation

## 2010-12-29 LAB — COMPREHENSIVE METABOLIC PANEL
ALT: 24 U/L (ref 0–53)
AST: 54 U/L — ABNORMAL HIGH (ref 0–37)
Alkaline Phosphatase: 31 U/L — ABNORMAL LOW (ref 39–117)
CO2: 25 mEq/L (ref 19–32)
Chloride: 93 mEq/L — ABNORMAL LOW (ref 96–112)
Creatinine, Ser: 1.23 mg/dL (ref 0.4–1.5)
GFR calc Af Amer: >60 mL/min (ref >60)
GFR calc non Af Amer: 57 mL/min — ABNORMAL LOW (ref >60)
Potassium: 4.8 mEq/L (ref 3.5–5.1)
Sodium: 129 mEq/L — ABNORMAL LOW (ref 135–145)
Total Bilirubin: 1.2 mg/dL (ref 0.3–1.2)

## 2010-12-29 LAB — CBC
HCT: 38.5 % — ABNORMAL LOW (ref 39.0–52.0)
MCH: 36.2 pg — ABNORMAL HIGH (ref 26.0–34.0)
MCV: 98.7 fL (ref 78.0–100.0)
Platelets: 98 10*3/uL — ABNORMAL LOW (ref 150–400)
RBC: 3.9 MIL/uL — ABNORMAL LOW (ref 4.22–5.81)

## 2010-12-29 LAB — URINALYSIS, ROUTINE W REFLEX MICROSCOPIC
Hgb urine dipstick: NEGATIVE
Specific Gravity, Urine: 1.015 (ref 1.005–1.030)
Urobilinogen, UA: 2 mg/dL — ABNORMAL HIGH (ref 0.0–1.0)

## 2010-12-29 LAB — ABO/RH: ABO/RH(D): A POS

## 2010-12-29 NOTE — H&P (Signed)
HISTORY AND PHYSICAL EXAMINATION  December 27, 2010  Re:  [name], WALKINS                  DOB:  June 16, 1931  REASON FOR ADMISSION:  Greater than 80% right carotid stenosis.  HISTORY:  This is a pleasant 77 year old gentleman who was referred by Dr. Antoine Poche with a greater than 80%  right carotid stenosis.  He has been following at 72-month intervals with carotid duplex scan at the [facility] office.  His most recent study showed that the right side had progressed to greater than 80%.  He was sent for vascular consultation.  Of note he is right-handed.  He denies any history of stroke, TIAs, expressive or receptive aphasia, or amaurosis fugax.  He has had no significant problems with dizziness.  He did have some problems with lightheadedness approximately 4 or 5 months ago according to his daughter.  However, this was attributed to some his medications which were adjusted.  PAST MEDICAL HISTORY: 1. Hypertension. 2. Hypercholesterolemia. 3. History of coronary artery disease.  He had a myocardial infarction     in 2005.  He had previous PTCA. 4. He has a defibrillator pacemaker placed approximately 2010.  He     does not have the details of this currently. 5. In addition he has a history of COPD and gout. He denies any history of diabetes or history or congestive heart failure.  SOCIAL HISTORY:  He is married.  He has 3 children.  He is a retired [employer] consultant.  This has 4 drinks a night.  He smokes cigars but not cigarettes.  Family history is unaware of any history of premature cardiovascular disease.  ALLERGIES:  No known drug allergies.  MEDICATIONS: 1. Simvastatin 40 mg p.o. daily. 2. Fenofibrate 134 mg p.o. daily. 3. Coreg 3.125 mg p.o. b.i.d. 4. Allopurinol 100  p.o. daily. 5. Lisinopril  5 mg p.o. b.i.d. 6. Multivitamin 1 p.o. daily. 7. Aspirin 81 mg p.o. daily. 8. Fiber pill daily.  REVIEW OF SYSTEMS:  GENERAL:  He had some weight loss recently.  He  had no fever or problem with his appetite. CARDIOVASCULAR:  He had no chest pain, chest pressure, palpitations or arrhythmias.  He does admit to dyspnea on exertion.  He had no orthopnea.  He had no history of DVT or phlebitis.  He had no claudication, rest pain or nonhealing ulcers. GI: He has occasional problems swallowing.  He had no recent change in his bowel habits. NEUROLOGIC:  He had no headaches or seizures. PULMONARY:  e had some history of wheezing and productive cough. HEMATOLOGIC:  He has had no bleeding problems or clotting disorders. ENT:  He had some gradual loss of his eyesight and hearing. Musculoskeletal, psychiatric, integumentary, and GU review of systems is unremarkable.  PHYSICAL EXAMINATION:  This is a pleasant 77 year old gentleman who appears his stated age.  Blood pressure is 147/83, heart rate is 84, respiratory rate is 12.  HEENT:  Unremarkable.  Lungs:  Clear bilaterally to auscultation without rales or rhonchi.  He does have some expiratory wheezes.  Cardiovascular:  He has a right carotid bruit.  He has a regular rate and rhythm.  He has palpable femoral pulses and warm well-perfused feet with no significant lower extremity swelling. Abdomen:  Soft and nontender with normal pitched bowel sounds. Musculoskeletal exam:  There is no major deformities or cyanosis. Neurologic exam:  He has no focal weakness or paresthesias.  Skin: There are no ulcers or  rashes.  I have reviewed his carotid duplex scan from the [facility] office which shows a greater than 80% right carotid stenosis.  This extends about 2 cm into the internal carotid artery.  On the left side he has a 60% to 79% stenosis.  Both vertebral arteries are patent with normally directed flow.  I have also reviewed the records from Dr. Jenene Slicker office.  He also has a history of compression fracture in his back in the past.  He also has a history of ischemic cardiomyopathy and diverticulosis of  the colon.  His EKG at the [facility] office showed a sinus rhythm with right bundle branch block and old inferior infarct.  Given the severity of right carotid stenosis I have recommended right carotid endarterectomy in order to lower his risk of future stroke.  We have discussed the indications for surgery and the potential complications including but not limited to bleeding, stroke (periprocedural risk 1-2%), nerve injury, MI, or other unpredictable medical problems.  All his questions were answered.  He is agreeable to proceed.  His surgery is scheduled for 01/02/2011.  He does know to continue his aspirin right up to surgery    Di Kindle. Edilia Bo, M.D. Electronically Signed  CSD/MEDQ  D:  12/27/2010  T:  12/28/2010  Job:  [id number]  cc:   [name], MD, [facility] Duke Salvia, MD, [facility]

## 2011-01-02 ENCOUNTER — Other Ambulatory Visit: Payer: Self-pay | Admitting: Vascular Surgery

## 2011-01-02 ENCOUNTER — Inpatient Hospital Stay (HOSPITAL_COMMUNITY)
Admission: RE | Admit: 2011-01-02 | Discharge: 2011-01-03 | DRG: 039 | Disposition: A | Discharge status: Home / Self Care | Payer: Medicare Other | Source: Ambulatory Visit | Attending: Vascular Surgery | Admitting: Vascular Surgery

## 2011-01-02 DIAGNOSIS — I6529 Occlusion and stenosis of unspecified carotid artery: Secondary | ICD-10-CM

## 2011-01-02 DIAGNOSIS — I10 Essential (primary) hypertension: Secondary | ICD-10-CM | POA: Diagnosis present

## 2011-01-02 DIAGNOSIS — I252 Old myocardial infarction: Secondary | ICD-10-CM

## 2011-01-02 DIAGNOSIS — Z7982 Long term (current) use of aspirin: Secondary | ICD-10-CM

## 2011-01-02 DIAGNOSIS — J4489 Other specified chronic obstructive pulmonary disease: Secondary | ICD-10-CM | POA: Diagnosis present

## 2011-01-02 DIAGNOSIS — E7800 Pure hypercholesterolemia, unspecified: Secondary | ICD-10-CM | POA: Diagnosis present

## 2011-01-02 DIAGNOSIS — I2510 Atherosclerotic heart disease of native coronary artery without angina pectoris: Secondary | ICD-10-CM | POA: Diagnosis present

## 2011-01-02 DIAGNOSIS — I2589 Other forms of chronic ischemic heart disease: Secondary | ICD-10-CM | POA: Diagnosis present

## 2011-01-02 DIAGNOSIS — Z95810 Presence of automatic (implantable) cardiac defibrillator: Secondary | ICD-10-CM

## 2011-01-02 DIAGNOSIS — M109 Gout, unspecified: Secondary | ICD-10-CM | POA: Diagnosis present

## 2011-01-02 DIAGNOSIS — Z9861 Coronary angioplasty status: Secondary | ICD-10-CM

## 2011-01-02 DIAGNOSIS — J449 Chronic obstructive pulmonary disease, unspecified: Secondary | ICD-10-CM | POA: Diagnosis present

## 2011-01-02 HISTORY — PX: CAROTID ENDARTERECTOMY: SUR193

## 2011-01-02 LAB — BASIC METABOLIC PANEL
BUN: 10 mg/dL (ref 6–23)
CO2: 20 mEq/L (ref 19–32)
GFR calc non Af Amer: >60 mL/min (ref >60)
Glucose, Bld: 74 mg/dL (ref 70–99)
Potassium: 4.3 mEq/L (ref 3.5–5.1)

## 2011-01-03 LAB — BASIC METABOLIC PANEL
BUN: 16 mg/dL (ref 6–23)
Calcium: 8.5 mg/dL (ref 8.4–10.5)
Creatinine, Ser: 0.9 mg/dL (ref 0.4–1.5)
GFR calc non Af Amer: >60 mL/min (ref >60)
Glucose, Bld: 138 mg/dL — ABNORMAL HIGH (ref 70–99)
Sodium: 127 mEq/L — ABNORMAL LOW (ref 135–145)

## 2011-01-03 LAB — CBC
HCT: 32 % — ABNORMAL LOW (ref 39.0–52.0)
MCH: 35.6 pg — ABNORMAL HIGH (ref 26.0–34.0)
MCHC: 36.6 g/dL — ABNORMAL HIGH (ref 30.0–36.0)
RDW: 11.7 % (ref 11.5–15.5)

## 2011-01-03 NOTE — Discharge Summary (Addendum)
  [name], [name]                  ACCOUNT NO.:  [id number]  MEDICAL RECORD NO.:  [id number]           PATIENT TYPE:  I  LOCATION:  3312                         FACILITY:  [facility]  PHYSICIAN:  Di Kindle. Edilia Bo, M.D.DATE OF BIRTH:  01/07/1931  DATE OF ADMISSION:  01/02/2011 DATE OF DISCHARGE:  01/03/2011                              DISCHARGE SUMMARY   CHIEF COMPLAINT:  Greater than 80% right carotid stenosis.  HISTORY OF PRESENT ILLNESS:  Mr. Zinni is a 77 year old gentleman, referred by Dr. Antoine Poche, greater than 80% right carotid stenosis.  He has been followed with 6 months' intervals for carotid duplex scan at [facility] office.  He has been asymptomatic.  Denying any history of stroke, TIAs, expressive or receptive aphasia, or amaurosis fugax.  He has had no significant problems with dizziness.  He occasionally has some light headedness secondary to his blood pressure medications, which were adjusted and this resolved.  PAST MEDICAL HISTORY:  Significant for: 1. Hypertension. 2. Hypercholesterolemia. 3. Coronary artery disease, PTCA. 4. Defibrillator pacemaker placed in 2010. 5. COPD. 6. Gout.  He denied any history of diabetes or congestive heart failure.  HOSPITAL COURSE:  The patient was taken to the operating room on January 02, 2011, for right carotid endarterectomy and Dacron patch angioplasty. The patient did well postoperatively.  He was voiding, ambulating, and taking p.o.  He had no tongue deviation or facial droop.  His wounds were healing well.  He was swallowing and speaking without difficulty. He had good and equal strength in the bilateral upper and lower extremities.  DISPOSITION:  The patient was discharged to home.  He will follow up in 2 weeks with Dr. Edilia Bo.  FINAL DIAGNOSES: 1. Critical asymptomatic right carotid stenosis status post right     carotid endarterectomy. 2. His chronic medical conditions were all stable and were controlled  with his present medications.  DISCHARGE MEDICATIONS: 1. Percocet 1-2 tablets every 6 hours as needed for pain. 2. Allopurinol 100 mg daily. 3. Aspirin 81 mg daily. 4. Carvedilol 3.125 mg twice daily. 5. Fenofibric acid 135 mg daily. 6. Fiber chewables 1 daily. 7. Lisinopril 5 mg twice daily. 8. Multivitamins daily. 9. Simvastatin 4 mg daily.     Della Goo, PA-C   ______________________________ Di Kindle. Edilia Bo, M.D.    RR/MEDQ  D:  01/03/2011  T:  01/03/2011  Job:  [id number]  Electronically Signed by Della Goo PA on 01/03/2011 03:16:00 PM Electronically Signed by Waverly Ferrari M.D. on 01/04/2011 10:49:27 AM

## 2011-01-04 NOTE — Op Note (Signed)
[name], [name]                  ACCOUNT NO.:  [id number]  MEDICAL RECORD NO.:  [id number]           PATIENT TYPE:  I  LOCATION:  3312                         FACILITY:  [facility]  PHYSICIAN:  Di Kindle. Edilia Bo, M.D.DATE OF BIRTH:  05/19/1931  DATE OF PROCEDURE:  01/02/2011 DATE OF DISCHARGE:                              OPERATIVE REPORT   PREOPERATIVE DIAGNOSIS:  Asymptomatic greater than 80% right carotid stenosis.  POSTOPERATIVE DIAGNOSIS:  Asymptomatic greater than 80% right carotid stenosis.  PROCEDURES:  Right carotid endarterectomy with Dacron patch angioplasty.  SURGEON:  Di Kindle. Edilia Bo, MD  ASSISTANT:  Pecola Leisure, PA  ANESTHESIA:  General.  INDICATIONS:  This is a 77 year old gentleman who was followed by Dr. Antoine Poche with carotid disease.  On a routine followup study, the right carotid stenosis had progressed to greater than 80% and right carotid endarterectomy was recommended in order to lower his risk of future stroke.  The patient had mild thrombocytopenia preoperatively, so it was felt that it was safe to proceed.  He also had an AICD which was turned off for the procedure.  TECHNIQUE:  The patient was taken to the operating room and received a general anesthetic.  The right neck was prepped and draped in the usual sterile fashion.  The skin had been infiltrated with 1% lidocaine with epinephrine.  An incision was made along the anterior border of the sternocleidomastoid and the dissection carried down to the common carotid artery, which was dissected free and controlled with Rumel tourniquet.  Of note, the artery was markedly calcified.  The facial vein was divided between 2-0 silk ties.  The internal carotid artery was then controlled above the plaque below the level of the hypoglossal nerve.  This was controlled with a vessel loop.  The superior thyroid artery and external carotid arteries were controlled.  The patient was then  heparinized.  Clamps were then placed on the internal, then the external, and the common carotid artery.  A longitudinal arteriotomy was made in the common carotid artery.  This was extended through the plaque into the internal carotid artery.  A 12 shunt was placed into the internal carotid artery, back bled, and then placed in the common carotid artery and secured with Rumel tourniquet.  Flow was reestablished of the shunt.  Endarterectomy plane was established proximally and the plaque was sharply divided.  Eversion endarterectomy was performed of the external carotid artery.  Distally there is a nice taper in the plaque.  No tacking sutures were required.  The artery was irrigated with copious amounts of heparin and dextran and all loose debris removed.  The Dacron patch was then sewn using continuous 6-0 Prolene suture.  Prior to completing the patch closure, the shunt was removed.  The artery back bled and flushed appropriately and the anastomosis completed.  Flow was reestablished first to the external carotid artery and into the internal carotid artery.  There was good Doppler flow distal to the patch and a good pulse.  The heparin was partially reversed with protamine.  The wound was irrigated, then the wound was closed with deep  layer of 3-0 Vicryl.  The platysma was closed with running 3-0 Vicryl.  The skin was closed with a 4-0 subcuticular stitch.  Sterile dressing was applied. The patient tolerated the procedure well was transferred to the recovery room in stable condition.  All needle and sponge counts were correct.     Di Kindle. Edilia Bo, M.D.     CSD/MEDQ  D:  01/02/2011  T:  01/03/2011  Job:  [id number]  cc:   [name], MD, [facility] Duke Salvia, MD, [facility]  Electronically Signed by Waverly Ferrari M.D. on 01/04/2011 10:49:16 AM

## 2011-01-17 ENCOUNTER — Ambulatory Visit (INDEPENDENT_AMBULATORY_CARE_PROVIDER_SITE_OTHER): Payer: Medicare Other | Admitting: Vascular Surgery

## 2011-01-17 DIAGNOSIS — I6529 Occlusion and stenosis of unspecified carotid artery: Secondary | ICD-10-CM

## 2011-01-18 NOTE — Assessment & Plan Note (Signed)
OFFICE VISIT  [name], [name] DOB:  January 07, 1931                                       01/17/2011 ZOXWR#:[id number]  I saw the patient in the office today for followup after his recent right carotid endarterectomy.  This is a 77 year old gentleman who had been referred by Dr. Antoine Poche with a greater than 80% right carotid stenosis.  He underwent a right carotid endarterectomy with Dacron patch angioplasty on January 02, 2011.  He did well postoperatively and he was discharged on postoperative day #1.  He returns for his first outpatient visit.  He has had no focal weakness or paresthesias and overall has been doing quite well.  On physical examination, this is a pleasant 77 year old gentleman who appears his stated age.  Blood pressure is 147/80, heart rate is 75, temperature is 98 .1.  His right neck incision is healing nicely.  Lungs are clear bilaterally to auscultation.  Cardiac exam:  He has a regular rate and rhythm.  On neurologic exam, he has no focal weakness or paresthesias.  Overall, I am pleased with his progress.  He will have a followup duplex scan in 6 months and then yearly.  He does know to continue taking his aspirin.  I plan on seeing him back in 18 months unless there has been any change in his carotid duplex scan.    Di Kindle. Edilia Bo, M.D. Electronically Signed  CSD/MEDQ  D:  01/17/2011  T:  01/18/2011  Job:  [id number]  cc:   [name], MD, [facility] Duke Salvia, MD, [facility]

## 2011-02-09 ENCOUNTER — Emergency Department (HOSPITAL_COMMUNITY): Payer: Medicare Other

## 2011-02-09 ENCOUNTER — Emergency Department (HOSPITAL_COMMUNITY)
Admission: EM | Admit: 2011-02-09 | Discharge: 2011-02-10 | Disposition: A | Discharge status: Home / Self Care | Payer: Medicare Other | Attending: Emergency Medicine | Admitting: Emergency Medicine

## 2011-02-09 DIAGNOSIS — I252 Old myocardial infarction: Secondary | ICD-10-CM | POA: Insufficient documentation

## 2011-02-09 DIAGNOSIS — Z9889 Other specified postprocedural states: Secondary | ICD-10-CM | POA: Insufficient documentation

## 2011-02-09 DIAGNOSIS — R071 Chest pain on breathing: Secondary | ICD-10-CM | POA: Insufficient documentation

## 2011-02-09 DIAGNOSIS — E789 Disorder of lipoprotein metabolism, unspecified: Secondary | ICD-10-CM | POA: Insufficient documentation

## 2011-02-09 DIAGNOSIS — I779 Disorder of arteries and arterioles, unspecified: Secondary | ICD-10-CM | POA: Insufficient documentation

## 2011-02-09 DIAGNOSIS — S0003XA Contusion of scalp, initial encounter: Secondary | ICD-10-CM | POA: Insufficient documentation

## 2011-02-09 DIAGNOSIS — Z862 Personal history of diseases of the blood and blood-forming organs and certain disorders involving the immune mechanism: Secondary | ICD-10-CM | POA: Insufficient documentation

## 2011-02-09 DIAGNOSIS — J449 Chronic obstructive pulmonary disease, unspecified: Secondary | ICD-10-CM | POA: Insufficient documentation

## 2011-02-09 DIAGNOSIS — S20219A Contusion of unspecified front wall of thorax, initial encounter: Secondary | ICD-10-CM | POA: Insufficient documentation

## 2011-02-09 DIAGNOSIS — M545 Low back pain, unspecified: Secondary | ICD-10-CM | POA: Insufficient documentation

## 2011-02-09 DIAGNOSIS — W010XXA Fall on same level from slipping, tripping and stumbling without subsequent striking against object, initial encounter: Secondary | ICD-10-CM | POA: Insufficient documentation

## 2011-02-09 DIAGNOSIS — I10 Essential (primary) hypertension: Secondary | ICD-10-CM | POA: Insufficient documentation

## 2011-02-09 DIAGNOSIS — Y92009 Unspecified place in unspecified non-institutional (private) residence as the place of occurrence of the external cause: Secondary | ICD-10-CM | POA: Insufficient documentation

## 2011-02-09 DIAGNOSIS — Z8639 Personal history of other endocrine, nutritional and metabolic disease: Secondary | ICD-10-CM | POA: Insufficient documentation

## 2011-02-09 DIAGNOSIS — J4489 Other specified chronic obstructive pulmonary disease: Secondary | ICD-10-CM | POA: Insufficient documentation

## 2011-02-09 DIAGNOSIS — S0100XA Unspecified open wound of scalp, initial encounter: Secondary | ICD-10-CM | POA: Insufficient documentation

## 2011-02-09 DIAGNOSIS — Z79899 Other long term (current) drug therapy: Secondary | ICD-10-CM | POA: Insufficient documentation

## 2011-02-09 LAB — DIFFERENTIAL
Basophils Absolute: 0 10*3/uL (ref 0.0–0.1)
Basophils Relative: 0 % (ref 0–1)
Eosinophils Absolute: 0.1 10*3/uL (ref 0.0–0.7)
Eosinophils Relative: 2 % (ref 0–5)
Lymphocytes Relative: 10 % — ABNORMAL LOW (ref 12–46)
Monocytes Absolute: 0.6 10*3/uL (ref 0.1–1.0)

## 2011-02-09 LAB — POCT CARDIAC MARKERS
CKMB, poc: 1.1 ng/mL (ref 1.0–8.0)
Myoglobin, poc: 63.2 ng/mL (ref 12–200)

## 2011-02-09 LAB — CBC
MCHC: 36.9 g/dL — ABNORMAL HIGH (ref 30.0–36.0)
Platelets: 85 10*3/uL — ABNORMAL LOW (ref 150–400)
RDW: 12.2 % (ref 11.5–15.5)
WBC: 8.1 10*3/uL (ref 4.0–10.5)

## 2011-02-10 ENCOUNTER — Emergency Department (HOSPITAL_COMMUNITY): Payer: Medicare Other

## 2011-02-10 LAB — URINALYSIS, ROUTINE W REFLEX MICROSCOPIC
Bilirubin Urine: NEGATIVE
Glucose, UA: NEGATIVE mg/dL
Ketones, ur: 15 mg/dL — AB
Leukocytes, UA: NEGATIVE
Nitrite: NEGATIVE
Protein, ur: NEGATIVE mg/dL
Specific Gravity, Urine: 1.014 (ref 1.005–1.030)
Urobilinogen, UA: 2 mg/dL — ABNORMAL HIGH (ref 0.0–1.0)
pH: 6 (ref 5.0–8.0)

## 2011-02-10 LAB — URINE MICROSCOPIC-ADD ON

## 2011-02-10 LAB — BASIC METABOLIC PANEL
CO2: 24 mEq/L (ref 19–32)
Calcium: 9.3 mg/dL (ref 8.4–10.5)
Chloride: 95 mEq/L — ABNORMAL LOW (ref 96–112)
Creatinine, Ser: 0.77 mg/dL (ref 0.4–1.5)
Glucose, Bld: 106 mg/dL — ABNORMAL HIGH (ref 70–99)

## 2011-02-10 LAB — URINE CULTURE
Colony Count: NO GROWTH
Culture  Setup Time: 201204210037

## 2011-02-12 ENCOUNTER — Telehealth: Payer: Self-pay | Admitting: Pulmonary Disease

## 2011-02-12 MED ORDER — ALBUTEROL SULFATE HFA 108 (90 BASE) MCG/ACT IN AERS: 2.0000 | INHALATION_SPRAY | RESPIRATORY_TRACT | Status: DC | PRN

## 2011-02-12 MED ORDER — PREDNISONE (PAK) 5 MG PO TABS: ORAL_TABLET | ORAL | Status: DC

## 2011-02-12 NOTE — Telephone Encounter (Signed)
Per SN: ED reports show no acute fractures, but if unable to care for pt at home i.e. Not eating/drinking then this is another issue > needs to be admitted to hosp or unable to cough and clear mucus, do not want pt to develop atelectasis.  Called spoke with [name], advised of SN's recs.  Per [name], her concerns have shifted.  She reports that she tried to get pt to walk to the kitchen to eat, but thought he may be too traumatized to do this and have an appetite.  So she fed him while in the bed and states that he ate/drank very well.  However, he now has marked rattling in the chest.  i again advised that we do not want him to develop any complications and he may need to be admitted, but [name] requested SN's recs again.  Per SN: pred dose pak 5mg  6d pack for the acute wheezing/lung inflammation, proair 2 puffs every 4 hours prn wheezing and mucinex 600mg  otc 2 tabs bid w/ plenty of water to help with the mucus buildup.  Also encourage coughing and deep breathing: hold a pillow to the chest and cough that way and pursed-lip breathing to prevent any complications.  Pt will need to f/u here once he is mobile and please keep up updated on his progress.  Called spoke with [name], advised of SN's recs as stated above.  [name] okay with these recs and verbalized her understanding.  rx's sent to [facility] per [name]'s request.  [name] to call when pt is mobile enough for ov and will keep [place] updated.

## 2011-02-12 NOTE — Telephone Encounter (Signed)
Called spoke with patient's daughter Arline Asp who reports that pt fell 4.20.12 while bringing groceries up the stairs into the house > fell backward on concrete.  Arline Asp states that pt is in a lot of pain, is not drinking enough fluids, no appetite w/ "nothing to eat since Friday", increased wheezing.  Does no report increased coughing, but a lot of pain when he does so.  echart ED report says cxr showed no PE or pneumothorax, CT head shows no evidence of traumatic injury or fracture and scalp hematoma overlying the high right parietal calvarium; C-Spine shows no evidence of fracture or subluxation along the cervical spine.  Offered appt for pt w/ SN on 4.25.12 @ 1000 (slot was open) but Arline Asp is concerned with moving patient that much d/t his pain.  Would like SN's recs.  Is also concerned about his meds: was dc'd on vicodin 5-325mg  and she is concerned about pt's history or alcohol consumption (i informed Arline Asp that the two should not be mixed) and pt's fenofibrate which comes in capsule form and she has been emptying the capsule into food/drink for him to take and noticed today that it recommends "swallow whole - do not crush or chew."  SN please, thanks!

## 2011-02-15 ENCOUNTER — Telehealth: Payer: Self-pay | Admitting: Pulmonary Disease

## 2011-02-15 NOTE — Telephone Encounter (Signed)
Per SN--ok for pt to come in tomorrow afternoon at 2pm to see SN.  thanks

## 2011-02-15 NOTE — Telephone Encounter (Signed)
Spoke w/ p daughter and she is aware of apt tomorrow at 2 pm for pt to be evaluated by SN

## 2011-02-16 ENCOUNTER — Encounter: Payer: Self-pay | Admitting: Pulmonary Disease

## 2011-02-16 ENCOUNTER — Ambulatory Visit (INDEPENDENT_AMBULATORY_CARE_PROVIDER_SITE_OTHER): Payer: Medicare Other | Admitting: Pulmonary Disease

## 2011-02-16 DIAGNOSIS — D126 Benign neoplasm of colon, unspecified: Secondary | ICD-10-CM

## 2011-02-16 DIAGNOSIS — I2589 Other forms of chronic ischemic heart disease: Secondary | ICD-10-CM

## 2011-02-16 DIAGNOSIS — I2510 Atherosclerotic heart disease of native coronary artery without angina pectoris: Secondary | ICD-10-CM

## 2011-02-16 DIAGNOSIS — M1990 Unspecified osteoarthritis, unspecified site: Secondary | ICD-10-CM

## 2011-02-16 DIAGNOSIS — I6529 Occlusion and stenosis of unspecified carotid artery: Secondary | ICD-10-CM

## 2011-02-16 DIAGNOSIS — E7800 Pure hypercholesterolemia, unspecified: Secondary | ICD-10-CM

## 2011-02-16 DIAGNOSIS — I10 Essential (primary) hypertension: Secondary | ICD-10-CM

## 2011-02-16 DIAGNOSIS — R1319 Other dysphagia: Secondary | ICD-10-CM

## 2011-02-16 DIAGNOSIS — W19XXXA Unspecified fall, initial encounter: Secondary | ICD-10-CM

## 2011-02-16 DIAGNOSIS — T1490XA Injury, unspecified, initial encounter: Secondary | ICD-10-CM

## 2011-02-16 DIAGNOSIS — J449 Chronic obstructive pulmonary disease, unspecified: Secondary | ICD-10-CM

## 2011-02-16 MED ORDER — FENOFIBRATE 160 MG PO TABS: 160.0000 mg | ORAL_TABLET | Freq: Every day | ORAL | Status: DC

## 2011-02-16 MED ORDER — PANTOPRAZOLE SODIUM 40 MG PO TBEC: DELAYED_RELEASE_TABLET | ORAL | Status: DC

## 2011-02-16 NOTE — Patient Instructions (Signed)
Today we updated your med list in our EPIC system...    We wrote a new prescription for PROTONIX 40mg  to take 1 tab 30 min before the 1st meal of the day...  For the Chest Wall Pain:    Get a "rib binder" (consider the wider "abdominal binder") to help support the chest wall...    Use the heating pad...    Use the Hydrocodone vs Tylenol for pain...  To avoid respiratory complications:    No smoking (you knew I was going to include that restriction!)    Continue the "lung exerciser" to expand the lung bases...    Continue the MUCINEX + lots of fluids...    Continue the inhaler as needed...    Finish out the Prednisone...    Call me for any problems...  For your throat & swallowing symptoms:    Start the PROTONIX as above...    We will arrange for a referral to Dr Bing Neighbors al in our GI division for further investigation (possible endoscopy)...    If the throat symptoms or voice don't improve as expected we will also want to get a look at your larynx/ vocal cords...  Let's plan a follow up visit to check your progress in about a month.Marland KitchenMarland Kitchen

## 2011-02-16 NOTE — Progress Notes (Signed)
Subjective:    Patient ID: [name], male    DOB: [birth year], 78 y.o.   MRN: [id number]  HPI 77 y/o WM here for a follow up visit... he has been followed closely by DrHochrein & DrKlein during the past years due to his cardiomyopathy & AICD...   ~  October 10, 2010:  Yearly ROV- doing well, he says w/o new complaints or concerns... states he had recent URI but toughed it out & better now... he saw DrKlein 6/11- f/u ischemic heart dis w/ decr LVF & AICD> noted some postural changes, hx syncope, on-going smoking, AICD was OK & no changes made...  he saw DrHochrein 8/11- cardiomyopathy, EF 40-45% on last 2DEcho, syncopal epis related to postural BP changes & he decreased his meds (Coreg3.125Bid & Lisin5Bid) & no prob since then...  he had CDopplers Q668mo w/ 60-79% bilat ICAstenoses (mod to severe mixed irreg plaque) on ASA daily & no cerebral ischemic symptoms...  he's had some on-going LBP & XRay 8/11 w/o acute changes (prev T12 augmentation, NAD).Marland Kitchen. up to date on his vaccinations and doesn't need refill perscriptions today.  ~  February 16, 2011:  [name] had a 68mo f/u CDoppler 2/12 w/ worsening RICA velocities c/w 80-99% stenosis (stable 60-79% LICA stenosis);  He was evaluated by [facility] DrDickson & underwent a right CAE w/ DPA 3/12 without complications;  He has been doing satis post-op & they plan f/u CDoppler in 68mo intervals going forward...  On 02/09/11 he was bringing groceries into the house when his legs gave way & he fell backwards striking his occiput & right side on the ground;  ER eval revealed signif contusions, ?right rib fxs, hematoma on occiput, etc;  Work up included CXR, Lumbar films, CT Brain & CSpine> see results below;  Not adm & Rx as outpt w/ rest, heat, Vicodin, etc...  Family called w/ concern for his pain level, not eating or drinking enough, incr wheezing & SOB> we called in Pred dosepak, Proair inhaler, Mucinex 2Bid w/ fluids;  They report much improved on this rx> eating better, more  mobile, still w/ pain, bruise right flank, tender, etc; and they note weak, weak voice, some reflux symptoms, hard to swallow Tricor capsule, etc...    We discussed finishing out the Pred, continue IS (for deep breathing), Proair, Mucinex, rib binder, heating pad, Protonix, GI eval for swallowing by DrPatterson, lax meds as needed...         Problem List:  COPD (ICD-496) - despite all efforts [name] continues to smoke 3-4 cigars per day... he has min cough, some phlegm, but denies CP, SOB, wheezing, etc... he does not want inhalers or breathing meds, and doesn't want Chantix or help w/ smoking cessation... ~  CXR 3/12 in hosp for CAE showed COPD/E, biapical pleuroparenchymal scarring, NAD, AICD on left, old left rib fx, old T12 vertebroplasty... ~  Fall at home 4/12 w/ signif trauma & prob right rib fxs (CXR in ER showed some atelec but NAD & no rib films done)... States he hasn't smoked at all since the fall 02/09/11.  HYPERTENSION (ICD-401.9) - controlled on COREG 3.125mg Bid & LISINOPRIL 5mg Bid...  ~  12/11:  BP= 126/70 and doing well> denies HA, fatigue, visual changes, CP, palipit, dizziness, dyspnea, edema, etc; he has had postural hypotension & several syncopal episodes- now improved w/ the final adjustment in his meds. ~  4/12:  BP= 90/58 ==>100/60 recheck & he is weak; asked to monitor BP at home & decr meds  if Koppel...  ATHEROSCLEROTIC HEART DISEASE (ICD-414.00) - on ASA 81mg /d + above meds... ISCHEMIC CARDIOMYOPATHY (ICD-414.8) Hx of SYNCOPE (ICD-780.2) & IMPLANTABLE DEFIBRILLATOR, DDD MDT (ICD-V45.02) ~  Cath in 2003 w/ 2 vessel CAD and stent placed in LAD...  ~  Cardiolite 1/08 w/ large inferolat infarct, no ischemia, EF=36%...  ~  2DEcho 2/08 w/ infer & post HK, EF=40%...  ~  recath 6/09 w/ heavily calcif vessels & 40% EF- DrHochrein has been following carefully and adjusting meds. ~  AICD placed 2009 by DrKlein for hx syncope & ischemic cardiomyopathy- followed by DrKlein yearly & doing  satis. ~  9/10: Cards tried to incr the Coreg to 9.375mg Bid but pt intol & went back to 6.25Bid. ~  8/11:  f/u by DrHochrein w/ recent syncopal episode & meds adjusted coreg 3.125Bid & Lisinopril 5Bid. ~  Serial XRays have all revealed signif atherosclerotic changes diffusely (eg- lumbar films 4/12, CT neck 4/12 as well).  CEREBROVASCULAR DISEASE PERIPHERAL VASCULAR DISEASE (ICD-443.9) - on ASA 81mg /d...  ~  He is s/p AAA repair [facility]... he is sedentary and hasn't had ABI's checked... I will leave this to DrHochrein... ~  CDopplers 7/10, 1/1,1 & 8/11 showed stable mod carotid dis bilat w/ heavy calcif plaque & 60-79% bilat ICA stenoses... ~  CDopplers 2/12 w/ worsening RICA velocities c/w 80-99% stenosis (stable 60-79% LICA stenosis);  He was evaluated by [facility] DrDickson & underwent a right CAE w/ DPA 3/12 without complications;  He has been doing satis post-op & they plan f/u CDoppler in 29mo intervals going forward... ~  NOTE:  CT Brain 4/12 in ER showed mild to mod cortical vol loss & cbll atrophy, sm vessel dis, no acute changes... Prom vasc calcif seen on neck films & in abd...  HYPERCHOLESTEROLEMIA (ICD-272.0) - on SIMVASTATIN 40mg /d & TRICOR 145/d. ~  FLP 4/08 showed TChol 131, TG 100, HDL 41, LDL 70 ~  FLP 2/09 showed TChol 124, TG 132, HDL 37, LDL 61 ~  FLP 12/10 > pt never ret for FLP & insurance changed [facility] to [facility]. ~  FLP 12/11 showed TChol 112, TG 51, HDL 43, LDL 59 ~  4/12:  They report some difficult swallowing the Tricor capsule==> referred to GI for swallowing eval & switched to FENOFIBRATE 160mg /d.  GERD/ DYSPHAGIA> ~  4/12: pt noted some reflux symptoms & mild dysphagia for large capsule; PROTONIX 40mg /d started & refer to GI for eval; they also note weak voice & may need ENT eval if not resolved in follow up...  DIVERTICULOSIS OF COLON (ICD-562.10) COLONIC POLYPS (ICD-211.3) HEMORRHOIDS (ICD-455.6) - last colonoscopy was 11/06 by DrPatterson showing  divertics, several 1-14mm polyps (hyperplastic), and hems...  Hx of LIVER FUNCTION TESTS, ABNORMAL (ICD-794.8) - improved off etoh... ~  labs 2/09 showed SGOT= 30, SGPT= 17 ~  labs 12/11 showed SGOT= 38, SGPT= 19  DEGENERATIVE JOINT DISEASE (ICD-715.90) Hx of GOUT (ICD-274.9) - on ALLOPURINOL 100mg /d... ~  labs 2/09 showed Uric= 3.3  OSTEOPOROSIS (ICD-733.00) - on Caltrate, MVI, Vit D... he had T12 compression after syncopal spell 2009 w/ vertebroplasty by DrDeveshwar...  BMD here 10/09 showed TScores +0.3 in Spine, & -2.7 in right FemNeck (Ortho eval by Janett Billow)... ~  labs 12/11 showed Vit D level = 22... rec to take Men's MVI + Vit D 2000 u daily.  DERMATITIS (ICD-692.9)  Health Maintenance -  ~  GI: colonoscopy 11/06 w/ several hyperplastic polyps removed... ~  GU: PSA 12/11 = 1.71 ~  Immunizations:  he tells me that he had PNEUMOVAX & 2010 Flu shot 10/10... received TETANUS shot here 2003...   Past Surgical History  Procedure Date  . Abdominal aortic aneurysm repair 2002    by Dr. Hart Rochester  . Cad stent 02/2002    Dr. Antoine Poche  . Aicd placed 03/2008    Dr. Graciela Husbands  . Right corotid enderectomy 12/2010    Dr. Edilia Bo    Outpatient Encounter Prescriptions as of 02/16/2011  Medication Sig Dispense Refill  . albuterol (PROAIR HFA) 108 (90 BASE) MCG/ACT inhaler Inhale 2 puffs into the lungs every 4 (four) hours as needed for wheezing.  1 Inhaler  3  . allopurinol (ZYLOPRIM) 100 MG tablet Take 100 mg by mouth daily.        Marland Kitchen aspirin 81 MG tablet Take 81 mg by mouth daily.        . carvedilol (COREG) 3.125 MG tablet Take 3.125 mg by mouth 2 (two) times daily with a meal.        . fenofibrate 160 MG tablet Take 1 tablet (160 mg total) by mouth daily.  ==> changed from Tricor to FENOFIBRATE...  11  . guaiFENesin (MUCINEX) 600 MG 12 hr tablet Take 1,200 mg by mouth 2 (two) times daily. With plenty of water.       . Inulin (FIBERCHOICE PO) Take 1 tablet by mouth daily.        Marland Kitchen lisinopril  (PRINIVIL,ZESTRIL) 5 MG tablet Take 5 mg by mouth 2 (two) times daily.        . predniSONE, Pak, (STERAPRED) 5 MG TABS Take as directed.  ==> finish out the Pred.  0  . simvastatin (ZOCOR) 40 MG tablet Take 40 mg by mouth at bedtime.        . pantoprazole (PROTONIX) 40 MG tablet Take 1 tablet by mouth 30 minutes before the first meal of the day  ==> ADDED this visit 30 tabs  11    No Known Allergies   Review of Systems         See HPI - all other systems neg except as noted... The patient complains of dyspnea on exertion, prolonged cough, and difficulty walking.  The patient denies anorexia, fever, weight loss, weight gain, vision loss, decreased hearing, hoarseness, chest pain, syncope, peripheral edema, headaches, hemoptysis, abdominal pain, melena, hematochezia, severe indigestion/heartburn, hematuria, incontinence, muscle weakness, suspicious skin lesions, transient blindness, depression, unusual weight change, abnormal bleeding, enlarged lymph nodes, and angioedema.     Objective:   Physical Exam      WD, WN, 77 y/o WM > weaker, chr ill appearing... GENERAL:  Alert & oriented; pleasant & cooperative... HEENT:  [place]/AT, EOM-full, PERRLA, EACs-clear  NOSE-clear, THROAT-clear & wnl, Voice sl weak... NECK:  Supple w/ fairROM; no JVD; prominent carotid impulses, scar on right, + bruits; no thyromegaly or nodules palpated; no lymphadenopathy... CHEST:  bilat rhonchi at bases & end-exp wheezing,  no rales, no signs of consolidation... HEART:  Regular Rhythm; gr 1/6 SEM, S4, no rubs... ABDOMEN:  Bruise in right flank, tender lower ribs; normal bowel sounds; no organomegaly or masses detected. EXT: without deformities, mild arthritic changes; no varicose veins/ +venous insuffic/ no edema. NEURO: no focal neuro deficits... DERM:  dry skin dermatitis, seborrhea, rosacea...   Assessment & Plan:   FALL w/ signif trauma> trauma to right chest wall/ flank/ etc... We discussed rest, heat, rib  binder, pain meds, IS, Proair, Mucinex, & finish the Pred...  COPD>  On above meds, he  has not smoked since the fall 4/20...  HBP>  BP trending low & we may have to decr or stop some of his meds;  For now watch BP at home, improve diet & intake; short term ROV recheck...  ASHD/ Cardiomyop/ AICD>  Followed by DHochrein & Graciela Husbands...  GI/ Dysphagia>  Rec starting PPI Rx w/ Protonix 40mg /d, & refer to DrPattterson for further eval;  NOTE: weak voice is likely reflux but if unresolved will need ENT eval of larynx...  Other medical issues as noted.Marland KitchenMarland Kitchen

## 2011-03-05 ENCOUNTER — Telehealth: Payer: Self-pay | Admitting: Cardiology

## 2011-03-05 NOTE — Telephone Encounter (Signed)
Per pt call - states he fell in April after passing out and he feels like it is because of his carvedilol and lisinopril.  Pt was treated in the ED at [facility] and followed up with Dr Kriste Basque.  Pt states when he doesn't take his pm medications he doesn't have syncope.  Pt is instructed to hold pm dose.   I will forward information to Dr Antoine Poche for review and further instructions.  Pt is in agreement.

## 2011-03-05 NOTE — Telephone Encounter (Signed)
Pt is having syncope on this medication Lisinopril 5mg  bid.Marland KitchenMarland KitchenMarland KitchenCarvedilol 125mg  bid he has cracked ribs and needs to know what to do about it.

## 2011-03-06 NOTE — Cardiovascular Report (Signed)
NAMEDEMETRUS, PAVAO NO.:  [id number]   MEDICAL RECORD NO.:  [id number]          PATIENT TYPE:  OIB   LOCATION:  1963                         FACILITY:  [facility]   PHYSICIAN:  [name], MD, FACCDATE OF BIRTH:  Dec 10, 1930   DATE OF PROCEDURE:  03/30/2008  DATE OF DISCHARGE:  03/30/2008                            CARDIAC CATHETERIZATION   PRIMARY:  Lonzo Cloud. Kriste Basque, MD   CARDIOLOGIST:  [name], MD, [facility].   PROCEDURE:  Left and right heart catheterization/coronary arteriography.   INDICATIONS:  The patient with ischemic cardiomyopathy, known coronary  disease status post stenting of his LAD.  An abnormal Cardiolite  demonstrating inferior wall infarct from base to apex.  He also had a  syncopal episode.   PROCEDURE NOTE:  Left heart catheterization performed via the right  femoral artery.  Right heart catheterization performed via the right  femoral vein.  Both vessels are cannulated using the anterior wall  puncture.  A #4-French arterial sheath and #7-French venous sheath were  inserted via the via Seldinger technique.  Preformed Judkins and a  pigtail catheter were utilized as well as a Swan-Ganz catheter.  The  patient tolerated procedure well and left the lab in stable condition.   RESULTS:  Hemodynamics RA mean 3, RV 29/7, PA 28/10 with a mean of 17,  pulmonary capillary wedge pressure mean 12, AO 156/71, LV 154/12.  Cardiac output/cardiac index 3.7/1.8.  Coronaries left main was heavily  calcified.  The LAD had proximal heavy calcification.  There was ostial  long 25% stenosis.  The proximal stent with mild in-stent luminal  irregularities.  The mid to distal vessel had 50% stenosis.  First  diagonal was small with ostial 40% stenosis.  The circumflex was large  and dominant.  There was large proximal subtotal stenosis in the AV  groove.  The vessel was seen to fill via bridging collaterals and also  LAD to distal circumflex.  There was a mid  obtuse marginal which had  long proximal 30% stenosis, but otherwise was free of high-grade  disease.  Posterolateral was long with diffuse luminal irregularities.  The PDA was large with moderate-to-mild diffuse plaque, but no high-  grade obstructive lesions.  The right coronary artery was dominant  without high-grade lesions.  Left ventriculogram, the left  ventriculogram was approximately 40%.  It was seen in the right anterior  oblique view.  There was anterior wall akinesis and inferolateral  hypokinesis.   CONCLUSION:  Severe single-vessel coronary artery disease with long  subtotal stenosis in the circumflex.  He has a patent LAD stent.  He has  a moderately reduced ejection fraction with regional wall motion  abnormalities.   PLAN:  Probably, we will manage the circumflex lesion medically.  He  needs aggressive risk reduction.  I am going to consult EP concerning  his syncope as this still could have been a primary arrhythmia  especially with his regional wall motion abnormalities and there is  evidence of scar on his LV.      [name], MD, [facility]  Electronically Signed  JH/MEDQ  D:  03/30/2008  T:  03/31/2008  Job:  [id number]   cc:   Lonzo Cloud. Kriste Basque, MD

## 2011-03-06 NOTE — Assessment & Plan Note (Signed)
[facility]                            CARDIOLOGY OFFICE NOTE   NAME:[name], [name]                         MRN:          [id number]  DATE:03/23/2008                            DOB:          12-Mar-1931    PRIMARY CARE PHYSICIAN:  Lonzo Cloud. Kriste Basque, M.D.   REASON FOR PRESENTATION:  Syncope.   HISTORY OF PRESENT ILLNESS:  The patient is a 77 years old.  He has a  history of coronary disease and a cardiomyopathy.  His ejection fraction  has been 40% which was noted last year.  It had been 70%  in 2003 at the  time of his last catheterization.  However, he has refused further  workup of this falling ejection fraction.  He had been denying dyspnea  except with very significant exertion.   Today he comes in added onto my schedule after calling us yesterday and  reporting a syncopal episode.  He says this is actually a second  syncopal episode.  He had one back about a year ago while he was in the  shower.  This one happened last Friday.  He was walking towards his car.  Without warning he lost consciousness.  He fell to the ground.  He says  he must have injured his back as it has been bothering him.  He awoke  with his head being cradled by a friend.  He knew where he was.  He had  had no prodrome.  He had no loss of bowel or bladder.  There is no  seizure activity.  He had no chest pain or palpitations.  Has had no  presyncope or syncope since that time.  He does not have any orthostatic  symptoms.   Of note, his son called [place] to tell us that his dad has been drinking  quite heavily.  This has been reported to me by his primary care  physician though Mr. Wyka very much minimizes his alcohol use.   The patient otherwise does report now increasing dyspnea.  He said this  has been with less activity than he initially reported.  He says he  cannot mow the lawn any more.  He does not describe PND or orthopnea.  He has again had no chest pressure, neck or arm  discomfort.   PAST MEDICAL HISTORY:  1. Coronary artery disease (catheterization in 2003 demonstrated 50%      ostial LAD stenosis, 90% proximal stenosis, 80% mid stenosis,      circumflex 20% stenosis, circumflex mid obtuse marginal 40-50%      stenosis.  He had a vigorous EF.  At that time he underwent      stenting of the LAD by Dr. Riley Kill).  2. Cardiomyopathy (currently 40% by the most recent echo).  3. Ongoing cigar smoking.  4. EtOH use.  5. Hypertension x 25 years.  6. Hyperlipidemia x 25 years.  7. Previous pneumonia.  8. Rosacea.  9. Diverticulosis.  10.Colonic polyps.  11.Hemorrhoids.   PAST SURGICAL HISTORY:  Abdominal aortic aneurysm repair, 2002.   ALLERGIES/INTOLERANCES:  None.  MEDICATIONS:  1. Aspirin 81 mg daily.  2. Vytorin 110/20 daily.  3. Tricor 145 mg daily.  4. Allopurinol 1 mg daily.  5. Fiber.  6. Lisinopril 5 mg daily.  7. Coreg 6.25 mg b.i.d..   SOCIAL HISTORY:  Patient is married.  Has three children.  He drinks  alcohol daily, though he will not quantify this for me.  He smokes about  five cigars a day but quit smoking about 15 years ago.   FAMILY HISTORY:  Family history is contributory as father having  coronary disease in his 85s.   REVIEW OF SYSTEMS:  As stated in the HPI; otherwise negative for all  other systems.   PHYSICAL EXAMINATION:  GENERAL:  The patient is in no distress.  VITAL SIGNS:  Blood pressure 132/72, heart rate 64 and regular, weight  182 pounds.  HEENT:  Eyelids unremarkable.  Pupils equal and reactive, fundi not  visualized, oral mucosa normal.  NECK:  No jugular venous distention at 45 degrees.  Carotid upstroke  brisk and symmetrical.  No bruits or thyromegaly.  LYMPHATICS:  No cervical, axillary, inguinal.  LUNGS:  Clear to auscultation bilaterally.  BACK:  No CVA tenderness.  CHEST:  Unremarkable.  HEART:  PMI not displaced or sustained. S1 and S2 within normal.  No S3,  no S4, no clicks, rubs, murmurs.   ABDOMEN:  Flat, positive bowel sounds.  Normal in frequency and pitch,  no bruits, rebound, guarding or midline pulsatile mass.  No hepatomegaly  or splenomegaly.  SKIN:  No rashes.  No nodules.  EXTREMITIES:  2+ pulses throughout.  No edema, cyanosis or clubbing.  NEURO:  Oriented to place and time.  Cranial nerves II-XII grossly  intact.  Motor grossly intact.   EKG shows sinus rhythm, rate 68, axis within normal limits.  Intervals  within normal limits.  Old inferior infarct.  Left atrial enlargement.  Lateral T-wave inversions, unchanged from previous.   ASSESSMENT/PLAN:  1. Syncope.  The patient had a syncopal episode.  This may have been      related to his alcohol which is what his son called to tell us.      However, he clearly has heart disease as well and the possibility      of a tachy arrhythmia or brady arrhythmia as the etiology is high.      The first step, I believe, would be a cardiac catheterization to      further rule out any ischemic heart disease and to further      understand why his ejection fraction has fallen over the years.      The patient has refused this up to this point.  However, he now      consents to a left heart catheterization.  He also consents to      right heart catheterization as he has had progressive dyspnea and      we need to further understand this.  I have discussed the risks and      benefits including stroke, death, heart attack, contrast allergy,      renal insufficiency, vascular trauma, thromboembolism, and others.      He understands risks of bleeding, bruising and infection.  He      agrees to proceed.  Until such time as we can complete his work-up,      he is not to be driving and he understands this.  2. Cardiomyopathy as above.  3. Coronary artery disease.  Again  will evaluate this as above.  Will      continue with risk reduction.  4. Dyslipidemia.  Goal being LDL less than 100, HDL greater than 40.  5. EtOH.  I will continue  to counsel him on tobacco and EtOH.  6. Gout.  The patient continues on his allopurinol.  7. Hypertension.  The patient's blood pressure is treated through      titration of his meds for cardiomyopathy and coronary disease.     [name], MD, [facility]  Electronically Signed    JH/MedQ  DD: 03/23/2008  DT: 03/23/2008  Job #: [id number]   cc:   Lonzo Cloud. Kriste Basque, MD

## 2011-03-06 NOTE — Letter (Signed)
April 01, 2008    [name], MD, [facility]  [address]  [address]  [place], [place] [address]   RE:  MUHAMMADALI, RIES  MRN:  [id number]  /  DOB:  11-Nov-1930   Dear Fayrene Fearing,   It was a pleasure to see Mr. Musich at your request regarding his  syncope.   As you know, he is a 77 year old gentleman with a remote myocardial  infarction of which he has no recollection, identified and confirmed by  Myoview scanning and catheterization, who has persistent obstructive  coronary disease with a previously implanted left anterior descending  stent, long lesion in the circumflex and   INCOMPLETE REPORT.    Sincerely,      Duke Salvia, MD, [facility]  Electronically Signed    SCK/MedQ  DD: 04/01/2008  DT: 04/01/2008  Job #: [id number]   CC:    Lonzo Cloud. Kriste Basque, MD

## 2011-03-06 NOTE — Assessment & Plan Note (Signed)
[facility]                            CARDIOLOGY OFFICE NOTE   NAME:[name], [name]                         MRN:          [id number]  DATE:05/12/2007                            DOB:          [birth year]    PRIMARY CARE PHYSICIAN:  Lonzo Cloud. Kriste Basque, M.D.   REASON FOR PRESENTATION:  Evaluate patient with cardiomyopathy.   HISTORY OF PRESENT ILLNESS:  The patient is a 77 year old gentleman who  presents for medication titration.  At the last visit, I increased his  Coreg to 12.5 mg twice a day.  He had no problems with this.  He had no  lightheadedness, presyncope, or syncope.  He has had no chest  discomfort, neck or arm discomfort.  He has had no new shortness of  breath, denies any PND or orthopnea.   PAST MEDICAL HISTORY:  1. Coronary artery disease (Proximal LAD 50% stenosis, mid 90%      followed by 80%.  He had stenting of the mid LAD.  He had a 50%      long circumflex stenosis.  His EF was normal on cardiac      catheterization in 2003.  His last Cardiolite demonstrated his EF      to be 36% with evidence of inferolateral wall infarct from base to      apex with no ischemia.  He did not want followup catheterization.).  2. Cardiomyopathy (36% by nuclear study, 40% by echo this year).  3. Hypertension x10 years.  4. Dyslipidemia.  5. Aortic aneurysm, status post repair.  6. Ongoing cigar use.   ALLERGIES:  None.   MEDICATIONS:  1. Aspirin 81 mg daily.  2. Vytorin 10/20 daily.  3. TriCor 145 mg daily.  4. Allopurinol 100 mg daily.  5. Lisinopril 5 mg daily.  6. Coreg 12.5 mg b.i.d.  7. Fiber.   REVIEW OF SYSTEMS:  As stated in the HPI and otherwise negative for  other systems.   PHYSICAL EXAMINATION:  GENERAL:  The patient is in no distress.  VITAL SIGNS:  Blood pressure 106/64, heart rate 78 and regular, weight  186 pounds, body mass index 26.  HEENT:  Eyelids unremarkable.  Pupils are equal, round, and reactive to  light.  Fundi not  visualized.  Oral mucosa unremarkable.  NECK:  No jugular venous distention at 45 degrees.  Carotid upstroke  brisk and symmetric.  No bruits.  No thyromegaly.  LYMPHATICS:  No adenopathy.  LUNGS:  Clear to auscultation bilaterally.  CHEST:  Unremarkable.  HEART:  PMI not displaced or sustained.  Quiet precordium.  S1 and S2  within normal limits.  No S3, no S4, no click, no rubs, no murmurs.  ABDOMEN:  Well healed surgical scar.  Positive bowel sounds, normal in  frequency and pitch.  No bruits.  No rebound.  No guarding.  No midline  pulsatile mass.  No hepatomegaly, no splenomegaly.  SKIN:  No rashes.  No nodules.  EXTREMITIES:  Two plus pulses.  No edema.  Dependent rubor.  NEUROLOGIC:  Grossly intact.   ASSESSMENT/PLAN:  1.  Cardiomyopathy.  Today, I will increase his Lisinopril to 5 mg      twice a day.  He will stay at the current dose of Coreg.  I would      like to try and get him to 20 mg of Lisinopril and 25 mg twice a      day of Coreg daily, however, I think his blood pressure will      probably preclude this.  2. Tobacco.  He knows that he needs to stop smoking completely.  3. Coronary disease.  He has not wanted repeat cardiac catheterization      but wants to be managed medically.  He denies any chest pain.  We      will titrate medications as above.  4. Dyslipidemia per Dr. Kriste Basque with a goal LDL less than 70, given his      ongoing smoking and HDL in the 40s.   FOLLOWUP:  I will see him back in about 1 month to see if I can do  further medicine titration.  I will get a B-MET at that time as well.     [name], MD, [facility]  Electronically Signed    JH/MedQ  DD: 05/12/2007  DT: 05/12/2007  Job #: [id number]   cc:   Lonzo Cloud. Kriste Basque, MD

## 2011-03-06 NOTE — Assessment & Plan Note (Signed)
[facility]                         ELECTROPHYSIOLOGY OFFICE NOTE   NAME:[name], [name]                         MRN:          [id number]  DATE:07/20/2008                            DOB:          [birth year]    [name] is seen in followup for syncope in the setting of prior  myocardial infarction.  He is status post ICD implantation.  He has had  no intercurrent ICD discharges.   His medications include Vytorin 10/20, Tricor, lisinopril 5, and Coreg  6.25 b.i.d.   On examination, his blood pressure was mildly elevated at 140/84, his  pulse was 75.  His lungs were clear.  Heart sounds were regular.  Neck  veins were flat.  Extremities were without edema.   Interrogation of his device demonstrated Medtronic Maxima with a P-wave  of 2.5, impedance of 494, threshold of 1 V at 0.4.  The R-wave was 8.2  with impedance of 646.  No intercurrent episodes were noted.  Device was  reprogrammed for longevity.  Battery voltage was 3.21.   IMPRESSION:  1. Ischemic heart disease with;      a.     Prior myocardial infarction.      b.     Depressed left ventricular function with ejection fraction       of approximately 40%.  2. Status post implantable cardioverter-defibrillator for the above      with microdislodgement requiring lead repositioning.   [name] is stable.  His device was reprogrammed to maximize longevity.   We will plan to see him again in 9 months' time.  He will be followed  remotely in the interim and will follow up otherwise with Dr. Antoine Poche.     Duke Salvia, MD, [facility]  Electronically Signed    SCK/MedQ  DD: 07/20/2008  DT: 07/20/2008  Job #: [id number]

## 2011-03-06 NOTE — Assessment & Plan Note (Signed)
[facility]                         ELECTROPHYSIOLOGY OFFICE NOTE   NAME:[name], [name]                         MRN:          [id number]  DATE:04/29/2008                            DOB:          April 07, 1931    Electrophysiologic Office/Admission Note   DIAGNOSIS:  Implantable cardioverter-defibrillator lead dislodgement.   HISTORY OF PRESENT ILLNESS:  The patient is a very pleasant 77 year old  man with an ischemic cardiomyopathy, congestive heart failure, and  syncope who underwent ICD implantation back on June 23.  At that time he  had a Medtronic Maximo System placed and returns today for followup for  wound check.  On interrogation the device was found to have R waves of 2  and no capture at 8 volts, indicative of lead dislodgement.  Unfortunately a chest x-ray has not been able to be obtained, and will  be done so in the hospital.  The patient denies palpitations or syncope.  He has had no intercurrent IC therapies.   Additional past medical history is notable for alcohol abuse.  He has a  history of dyslipidemia and hypertension.  He has a history of a  repaired abdominal aortic aneurysm.   PHYSICAL EXAM:  GENERAL:  He is a pleasant 77 year old man in no acute  distress.  VITAL SIGNS:  Blood pressure 120/80, the pulse 72 and regular,  respirations were 18.  HEENT:  Normocephalic and atraumatic.  Pupils equal, and round.  Oropharynx was moist.  Sclerae were anicteric.  NECK:  Revealed no jugular distention.  No thyromegaly.  Trachea was  midline.  Carotids were 2+ and symmetric.  LUNGS:  Clear bilaterally to auscultation.  No wheezes, rales, or  rhonchi present.  CARDIOVASCULAR EXAM:  Revealed a regular rate and rhythm.  Normal S1 and  S2.  PMI was enlarged and laterally displaced.  ABDOMINAL:  Exam was soft, nontender.  EXTREMITIES:  Demonstrated no edema.  NEUROLOGIC:  Exam was nonfocal.   IMPRESSION:  1. Implantable  cardioverter-defibrillator lead dislodgement.  2. History of ischemic cardiomyopathy.  3. Congestive heart failure and syncope.   DISCUSSION:  I have discussed the treatment options with the patient.  I  have recommended immediate admission for ICD lead revision.  I have  discussed this with Dr. Graciela Husbands who will perform the procedure.  This has  been scheduled at the earliest possible convenient time.    Doylene Canning. Ladona Ridgel, MD  Electronically Signed   GWT/MedQ  DD: 04/29/2008  DT: 04/29/2008  Job #: [id number]

## 2011-03-06 NOTE — Letter (Signed)
April 01, 2008    [name], MD, [facility]  [address]  [address]  [place], [place] [address]   RE:  [name], [name]  MRN:  [id number]  /  DOB:  [birth year]   Dear Fayrene Fearing:   It was a pleasure to see today, [name] with his wife and daughter  concerning his syncopal episode.   As you know, he is a 77 year old gentleman with a history of myocardial  infarction with a prior LAD stent and residual significant circumflex  subtotal disease with TIMI II flow, ejection fraction of approximately  40% and previous inferolateral wall motion abnormality.  The chart  describes an anterior wall motion abnormality, but the Myoview scan  dated January 2008, has inferolateral fixed defects.   He had three episodes of impaired consciousness.   The first occurred about a year or two ago where he was in the shower,  he became presyncopal.  He momentarily was able to get his balance in  the next 3-4 minutes, and he was able to go on about his business.  He  denies significant residual fatigue.   The other episode occurred while getting up to go urinate in early  night.  He became dizzy upon arising from that bed.  He laid down on the  floor and the symptoms passed over the next few minutes and he able to  proceed.   A more alarming episode occurred Friday a week ago.  He had, had lunch  about 10 miles or so from [place] [employer]'s parking lot; it was a relatively  light lunch.  His friend drove him back to the parking lot at [facility]  He got out of the passenger seat and walked around behind the back of a  car, and between the back of a car and the front of his jeep, he lost  consciousness without warning.  He awakened relatively briefly  thereafter and was able to get up without any sequelae.  He then got in  his car and drove home.  He denies any history of palpitations.   He has a mild exercise intolerance.  He is able to mow his lawn only for  about 10 or 15 minutes at a time without getting short of  breath.   He does have a history of COPD.  He continues to smoke four cigars a  day.  His wife notes though that after his stent in the mid 90s, that  his exercise intolerance improved significantly, and it is not clear  whether his shortness of breath is related to his heart or his lungs at  this point.   He denies orthostatic intolerance.  He denies shower intolerance.   PAST MEDICAL HISTORY:  In addition to the above is notable for sexual  dysfunction, spine arthritis, gout and skin cancers.  He also has  constipation.   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   SOCIAL HISTORY:  He is a mostly retired Clinical research associate.  He has three children,  one of whom he continues to work in his [employer], water [employer].  He smokes.  He  does not use recreational drugs.  He does drink alcohol, he admits to  least four drinks per day of Scotch and vodka.   MEDICATIONS:  1. Coreg 6.25 b.i.d., down titrated from 12.5 because of the dizzy      spells last fall.  2. Lisinopril 5.  3. Allopurinol 100.  4. Tricor 145.  5. Vytorin 10/20.  6. Aspirin.   PHYSICAL EXAMINATION:  GENERAL:  He is an elderly Caucasian male with a  ruddy facies.  VITAL SIGNS:  His blood pressure is 127/79 with a pulse of 69.  After 5  minutes of standing, it was 122/77 with a pulse of 86.  HEENT:  Demonstrated no icterus or xanthoma.  NECK:  The neck veins were flat.  The carotids are brisk and full  bilaterally with a right-sided bruit.  BACK:  Without kyphosis or  scoliosis.  LUNGS:  Clear.  HEART:  Sounds were regular with S4 and a 2/6 murmur along the left  sternal border.  ABDOMEN:  Soft with active bowel sounds without midline pulsation or  hepatomegaly.  EXTREMITIES:  Femoral pulses were 2+, distal pulses were intact.  There  is no clubbing, cyanosis or edema.  NEUROLOGIC:  Grossly normal.  SKIN:  Warm and dry apart from his red  facies.   Electrocardiogram that you obtained last week demonstrated sinus rhythm  at 68 with intervals of 0.17/0.14/0.42.  This was associated with a  nonspecific IVCD with evidence of a previous inferior-posterior wall  motion abnormality and T-wave inversions were noted in the lateral  leads.   IMPRESSION:  1. Syncope, abrupt in onset and offset.  2. Two episodes of presyncope, one of which was premicturition and the      other which occurred in the shower, both of which had longer onset      and offset.  3. Ischemic heart disease.      a.     Inferolateral myocardial infarction by scanning;      b.     Chart describes anterior myocardial infarction.      c.     Residual subtotal circumflex disease.  4. Intraventricular conduction delay.  5. Alcohol use as described previously.  6. Right carotid bruit.  7. Significant back pain following his fall.   [name] has syncope in the setting of ischemic heart disease and an  IVCD and a large myocardial infarction.  The differential diagnoses  would include vasomotor episodes, bradycardia, tachycardia primarily.  The abrupt resolution of his symptoms following his most recent syncopal  episode makes me think that vasomotor is less likely, although the first  two events sound like they certainly could have been and the situation  was consistent with that.  The IVCD would suggest the possibility of  conduction system disease and the abrupt onset and offset of the episode  by the car is consistent with a transient bradyarrhythmia.  He is also,  however, at high risk for myocardial infarction given his large scar.   Diagnostic interventions could include EP testing, although  unfortunately the outcome of patients who have negative EP test who  undergo ICD implantation who have ischemic heart disease with depressed  LV function is so untoward that the likelihood of ICD therapies that are  appropriate approaches that of aborted cardiac  arrest survivors.  I  would favor bypassing the EP study and going straight to ICD  implantation with a dual-chamber device.  I have reviewed the potential  benefits, as well as the potential risks of the above with the family.  They understand these risks, including but not limited to death,  perforation, infection, lead dislodgement and device malfunction and  they would like to proceed.    In anticipation of the ICD implantation, we  will undertake an MRI scan  so that it is available as needed.   In addition, we will plan to get carotid Dopplers.   Furthermore, the patient is advised he should not be driving, probably  for the next 6 months.    Sincerely,      Duke Salvia, MD, [facility]  Electronically Signed    SCK/MedQ  DD: 04/01/2008  DT: 04/01/2008  Job #: [id number]   CC:    Lonzo Cloud. Kriste Basque, MD

## 2011-03-06 NOTE — Assessment & Plan Note (Signed)
HEALTHCARE                            CARDIOLOGY OFFICE NOTE   NAME:[name], Winski                         MRN:          [id number]  DATE:07/21/2007                            DOB:          [birth year]    PRIMARY CARE PHYSICIAN:  Dr. Alroy Dust.   REASON FOR PRESENTATION:  Cardiomyopathy.   HISTORY OF PRESENT ILLNESS:  The patient is 77 years old.  He presents  for medication titration.  He has had no problems since I last saw him.  He did bring his medications with him today.  It is clear that he is  taking the medications that I had prescribed including his dose of Coreg  listed below.  He has had no shortness of breath.  He denies any PND,  orthopnea.  He denies any chest discomfort, palpitations, presyncope or  syncope.   PAST MEDICAL HISTORY:  Coronary artery disease (see the 06/19/07 note for  details), cardiomyopathy, (EF 40% by echo this year), hypertension x10  years, dyslipidemia, aortic aneurysm status post repair, ongoing cigar  use.   ALLERGIES:  None.   MEDICATIONS:  Aspirin 81 mg daily, Vytorin 10/20 daily, TriCor 145 mg  daily, allopurinol 100 mg daily, fiber, lisinopril 5 mg daily, Coreg  12.5 mg b.i.d.   REVIEW OF SYSTEMS:  As stated in the HPI and otherwise negative for  other systems.   PHYSICAL EXAMINATION:  The patient is in no distress.  Blood pressure  82/56, heart rate 72 and regular, weight 185 pounds, body mass index 26.  HEENT:  Eyes unremarkable, pupils are equal, round and reactive to  light, fundi not visualized, oral mucosa unremarkable.  NECK:  No jugular venous distension at 45 degrees, carotid upstroke  brisk and symmetric, no bruits, no thyromegaly.  LYMPHATICS:  No cervical, axillary, inguinal adenopathy.  LUNGS:  Clear to auscultation bilaterally.  BACK:  No costovertebral angle tenderness.  CHEST:  Unremarkable.  HEART:  PMI is not displaced or sustained, S1 and S2 are within normal  limits, no S3, no  S4, no clicks, rubs, murmurs.  ABDOMEN:  Obese, positive bowel sounds, normal in frequency and pitch,  no bruits, no rebound, no guarding, no midline pulsatile mass, no  organomegaly.  SKIN:  No rashes.  EXTREMITIES:  Two + pulse, no edema.   ASSESSMENT AND PLAN:  1. Cardiomyopathy.  Today patient's blood pressure will not allow      medication titration.  He is doing well and has class I symptoms on      the regimen listed.  At this point no further cardiovascular      testing is suggested.  2. Coronary disease.  He will continue with the risk reduction per Dr.      Kriste Basque.  3. Tobacco.  He understands he needs to quit smoking altogether.  4. Followup.  The patient will followup in 6 months.  Please note that      he had refused further workup with catheterization in the past.      Again we are going to manage him expectantly and with medical  management.     [name], MD, [facility]  Electronically Signed    JH/MedQ  DD: 07/21/2007  DT: 07/21/2007  Job #: [id number]   cc:   Lonzo Cloud. Kriste Basque, MD

## 2011-03-06 NOTE — Assessment & Plan Note (Signed)
[facility]                            CARDIOLOGY OFFICE NOTE   NAME:[name], [name]                         MRN:          [id number]  DATE:02/03/2008                            DOB:          1930/11/09    PRIMARY CARE PHYSICIAN:  Lonzo Cloud. Kriste Basque, M.D.   REASON FOR PRESENTATION:  Evaluate patient with coronary disease,  cardiomyopathy.   HISTORY OF PRESENT ILLNESS:  The patient presents for followup.  At the  last visit, I had to reduce his Coreg because he was lightheaded.  He  says since then he has done quite well.  He is not having any  lightheadedness, presyncope, or syncope.  He does not have any chest  discomfort, neck or arm discomfort.  He will get winded if he works in  the heart vigorously, but otherwise is describing no shortness of breath  and has no PND or orthopnea.   PAST MEDICAL HISTORY:  1. Coronary artery disease (see the June 19, 2007 note for details).  2. Cardiomyopathy (EF 40% by echocardiogram in 2008).  3. Hypertension x10 years.  4. Dyslipidemia.  5. Aortic aneurysm, status post repair.  6. Ongoing cigar use.   ALLERGIES:  None.   CURRENT MEDICATIONS:  1. Aspirin 81 mg daily.  2. Vytorin 10/20 daily.  3. TriCor 145 mg daily.  4. Allopurinol 100 mg daily.  5. Fiber daily.  6. Lisinopril 5 mg daily.  7. Carvedilol 6.25 mg b.i.d..   REVIEW OF SYSTEMS:  As stated in the HPI, otherwise for other systems.   PHYSICAL EXAMINATION:  GENERAL:  The patient is in no distress.  VITAL SIGNS:  Blood pressure 117/66, heart rate 60 and regular, weight  182 pounds, body mass index 25.  NECK:  No jugular venous distention at 45 degrees.  Carotid upstroke  brisk and symmetrical.  No bruits, no thyromegaly.  LYMPHATIC:  No lymphadenopathy.  LUNGS:  Clear to auscultation bilaterally.  BACK:  No costovertebral angle tenderness.  CHEST:  Unremarkable.  HEART:  PMI not displaced or sustained.  S1 and S2 within normal limits.  No S3,  no S4, no clicks, rubs, no murmurs.  ABDOMEN:  Flat, positive bowel sounds, normal in frequency and pitch, no  bruits, no rebound, no guarding, no midline pulsatile mass, no  organomegaly.  SKIN:  No rash, no nodules.  EXTREMITIES:  2+ pulses, no edema.   EKG:  Sinus rhythm, rate 63, axis within normal limits, intervals within  normal limits, old inferior infarct, inferolateral T-wave inversions,  not significantly changed from his most recent EKGs.   ASSESSMENT AND PLAN:  1. Cardiomyopathy.  The patient will remain on the medications as      listed.  No further cardiovascular testing is suggested.  Of note,      I have suggested to this patient a cardiac catheterization in the      past, but he has refused this.  We will continue to manage this      ischemic cardiomyopathy.  2. Coronary artery disease, as described.  He will continue with risk  reduction.  3. Dyslipidemia.  He had his lipids checked recently by Dr. Kriste Basque.  He      said that his LDL was a little bit low.  He is going to try to      increase his activity to bring this up.  I do not have these labs,      but will look for these to review.  4. Followup.  He will come back in 1 year or sooner if needed.     [name], MD, [facility]  Electronically Signed    JH/MedQ  DD: 02/03/2008  DT: 02/03/2008  Job #: [id number]   cc:   Lonzo Cloud. Kriste Basque, MD

## 2011-03-06 NOTE — Progress Notes (Signed)
[facility]                        PERIPHERAL VASCULAR OFFICE NOTE   NAME:[name], [name]                         MRN:          [id number]  DATE:04/11/2007                            DOB:          13-Oct-1931    PRIMARY CARE PHYSICIAN:  [name]. [name], M.D.   REASON FOR PRESENTATION:  Evaluate patient for cardiomyopathy.   HISTORY OF PRESENT ILLNESS:  Patient is a 77 year old gentleman who  returns for med titration.  At the last visit, I increased his Coreg to  9.375 mg b.i.d.  He did not have any episodes of lightheadedness,  presyncope, or syncope with this.  He has not had any chest pain or  shortness of breath.  He denies any PND or orthopnea.   PAST MEDICAL HISTORY:  Coronary artery disease (proximal LAD, 50%  stenosis, mid 90%, followed by 80% stenosis).  He had stenting of the  mid LAD.  He had a 50% long circumflex stenosis.  The EF was normal on  catheterization in 2003).  Cardiomyopathy (Cardiolite 36% with an  intermittent infarct with no ischemia).  Hypertension x10 years.  Dyslipidemia.  Aortic aneurysm, status post repair, ongoing cigar use.   ALLERGIES:  None.   MEDICATIONS:  Aspirin 81 mg daily, Vytorin 10/20 daily, Tricor 145 mg  daily, Allopurinol 100 mg daily, lisinopril 5 mg daily, Coreg 9.375 mg  b.i.d.   REVIEW OF SYSTEMS:  As stated in the HPI, otherwise negative for other  systems.   PHYSICAL EXAMINATION:  GENERAL:  Patient is in no distress.  VITAL SIGNS:  Blood pressure 102/61, heart rate 71 and regular.  Weight  185 pounds.  NECK:  No jugular venous distention at 45 degrees.  Carotid upstroke  brisk and symmetric.  No bruits, no thyromegaly.  LUNGS:  Clear to auscultation bilaterally.  CHEST:  A well-healed sternotomy scar.  HEART:  PMI not displaced or sustained.  S1 and S2 within normal limits.  No S3, no S4, no clicks, rubs, or murmurs.  ABDOMEN:  Flat, positive bowel sounds.  Normal in frequency and pitch.  No  bruits, rebound, guarding.  There are no midline pulsatile masses.  No organomegaly.  SKIN:  No rashes, no nodules.  EXTREMITIES:  Pulses 2+.  No edema.   ASSESSMENT/PLAN:  1. Cardiomyopathy:  I am going to titrate his Coreg to 12.5 mg b.i.d.      If he can tolerate it at the next visit, I will probably go higher      on his lisinopril to 7.5 or 10 mg a day.  He is to continue the      other medications as listed.  2. Coronary disease:  He has refused cardiac catheterization.  We are      managing him medically.  3. Tobacco:  He understands the need to quit smoking altogether.  4. Followup:  I will see him in one month for med titration.     Jesse Rotunda, MD, [facility]  Electronically Signed    JH/MedQ  DD: 04/11/2007  DT: 04/11/2007  Job #: [id number]   cc:  [name]. Jesse Gilford, MD

## 2011-03-06 NOTE — Assessment & Plan Note (Signed)
[facility]                            CARDIOLOGY OFFICE NOTE   NAME:[name], [name]                         MRN:          [id number]  DATE:06/19/2007                            DOB:          09/09/1931    PRIMARY CARE PHYSICIAN:  Lonzo Cloud. Kriste Basque, M.D.   REASON FOR PRESENTATION:  Evaluate patient with cardiomyopathy.   HISTORY OF PRESENT ILLNESS:  The patient is 77 years old.  He presents  for med titration.  Today, he finally brought his medications.  Unfortunately, it does not look like he has been understanding the  directions.  Despite the Coreg saying 12.5 mg twice a day on the bottle,  he has been taking a combination of 12.5 and 6.25 once a day.  His  Lisinopril which was supposed to be 5 twice a day, he has only been  taking once a day.  He also has a prescription apparently for benazepril  and I am not sure if he is taking this.  He has not had any new  symptoms, though he had one episode of lightheadedness after taking his  Coreg.  He denies any chest pain, neck or arm discomfort.  He has had no  new shortness of breath and denies any PND or orthopnea.   PAST MEDICAL HISTORY:  1. Coronary artery disease (Proximal LAD 50% stenosis.  Mid 90%      followed by 80% stenosis.  He had stenting of the mid LAD.  He had      a 50% long circumflex stenosis.  EF was normal at catheterization      in 2003.  His last Cardiolite demonstrated an EF to be 36% with      evidence of an inferolateral wall infarct from base to apex with no      ischemia.  He did not want followup catheterization.)  2. Cardiomyopathy as described (His EF was actually 40% by echo this      year).  3. Hypertension x10 years.  4. Dyslipidemia.  5. Aortic aneurysm, status post repair.  6. Ongoing cigar use.   ALLERGIES:  None.   MEDICATIONS:  1. Aspirin 81 mg daily.  2. Vytorin 10/20 daily.  3. TriCor 145 mg daily.  4. Allopurinol.  5. Lisinopril 5 mg daily.  6. Coreg 16.75  mg once a day.   REVIEW OF SYSTEMS:  As stated in the HPI and otherwise negative for  other systems.   PHYSICAL EXAMINATION:  GENERAL:  The patient is in no acute distress.  VITAL SIGNS:  Blood pressure 82/56, heart rate 72 and regular, weight  185 pounds, body mass index 26.  NECK:  No jugular venous distention at 45 degrees.  Carotid upstroke  brisk and symmetric.  No bruits.  No thyromegaly.  LYMPHATICS:  No  adenopathy.  LUNGS:  Clear to auscultation bilaterally.  BACK:  No costovertebral angle tenderness.  CHEST:  Unremarkable.  HEART:  PMI not displaced or sustained.  S1 and S2 within normal limits.  No S3, no S4, no clicks, no rubs, no murmurs.  ABDOMEN:  Obese.  Positive bowel sounds, normal in frequency and pitch.  No bruits.  No rebound.  No guarding.  No midline pulsatile mass.  No  organomegaly.  SKIN:  No rashes.  No nodules.  EXTREMITIES:  Two plus pulses.  No edema.   ASSESSMENT/PLAN:  1. Cardiomyopathy.  I am trying to titrate his medications.  Today was      the first day he brought his medication bottles.  It is clear he      has not been understanding the directions.  I am going to have my      nurse review with him the list very carefully.  I am going to      continue with Coreg 12.5 mg twice a day.  I think his low blood      pressure today reflects the fact that he probably took 16 mg this      morning.  I am going to have him reduce his Lisinopril to 5 mg once      a day rather than 10 mg daily or 5 mg twice a day.  He will      continue on this regimen for a month.  I will then see him back for      medication titration.  2. Tobacco.  He has been cautioned about the need to stop smoking.  3. Coronary disease.  He has had no new symptoms and refused      catheterization with his abnormal Cardiolite recently.   FOLLOWUP:  I will see him back in one month and hopefully he will be on  the meds as prescribed.  And, I will titrate further as I can.      [name], MD, [facility]  Electronically Signed    JH/MedQ  DD: 06/19/2007  DT: 06/20/2007  Job #: [id number]   cc:   Lonzo Cloud. Kriste Basque, MD

## 2011-03-06 NOTE — Op Note (Signed)
NAMELIN, GLAZIER NO.:  [id number]   MEDICAL RECORD NO.:  [id number]          PATIENT TYPE:  INP   LOCATION:  6527                         FACILITY:  [facility]   PHYSICIAN:  Doylene Canning. Ladona Ridgel, MD    DATE OF BIRTH:  December 17, 1930   DATE OF PROCEDURE:  DATE OF DISCHARGE:  04/14/2008                               OPERATIVE REPORT   PROCEDURE PERFORMED:  Implantable cardioverter-defibrillator testing.   INDICATION:  Status post ICD implant with the development of hypotension  on the initial day of implant resulting in need for delay in  defibrillation threshold testing until today.   PROCEDURE:  After informed consent was obtained, the patient was prepped  in the usual manner.  He was sedated with fentanyl and Versed with my  direction.  VF was induced with a T-wave shock. A 15 joule shock was  subsequently delivered, which terminated ventricular fibrillation  restored sinus rhythm.  The patient tolerated the procedure well.  There  is no immediate procedure complications.   COMPLICATIONS:  There were no immediate procedure complications and full  results demonstrated successfully.  IC defibrillation threshold testing.  The patient with a trail ICD implant for an ischemic cardiomyopathy and  congestive heart failure.      Doylene Canning. Ladona Ridgel, MD  Electronically Signed     GWT/MEDQ  D:  04/14/2008  T:  04/15/2008  Job:  [id number]   cc:   Lonzo Cloud. Kriste Basque, MD

## 2011-03-06 NOTE — Discharge Summary (Signed)
NAMETEMPLE, SPORER NO.:  [id number]   MEDICAL RECORD NO.:  [id number]          PATIENT TYPE:  INP   LOCATION:  6527                         FACILITY:  [facility]   PHYSICIAN:  Duke Salvia, MD, FACCDATE OF BIRTH:  [birth year]   DATE OF ADMISSION:  04/13/2008  DATE OF DISCHARGE:  04/14/2008                               DISCHARGE SUMMARY   ALLERGIES:  This patient has no known drug allergies.   FINAL DIAGNOSES:  1. History of recurrent syncope.  2. Interventricular conduction delay.  3. Right carotid bruit.  4. History of prior myocardial infarction with large inferolateral      SCAR.  5. Discharging day 1, status post implant of cardioverter-      defibrillator.  6. Discharging day of defibrillator threshold study by Dr. Graciela Husbands.  7. Discharging day of  kyphoplasty for a T12 fracture, occurring after      a fall.  This was done by Interventional Radiology.   SECONDARY DIAGNOSES:  1. History of myocardial infarction, stent to the left anterior      descending.  2. Ejection fraction of 40%.  3. Chronic obstructive pulmonary disease.  4. Gout.  5. History of skin cancer.  6. Erectile dysfunction.   PROCEDURES:  1. April 13, 2008, implant of a cardioverter-defibrillator by Dr.      Sherryl Manges for primary prevention.  2. Defibrillator threshold study, April 14, 2008, study was held up due      to over sedation on April 13, 2008.  3. Kyphoplasty, Interventional Radiology, April 14, 2008.   BRIEF HISTORY:  [name] is a 77 year old male.  He has a history of  prior myocardial infarction.  He has by [facility] study a large  inferolateral scar.  He has interventricular conduction delay.  He has  also had recurrent syncope.  He was seen by Dr. Graciela Husbands in consultation  and the decision was made to implant a cardioverter-defibrillator for  primary prevention.  The patient will present electively for this.  The  patient also has a right carotid bruit and a T12 fracture  secondary to a  syncopal event.   HOSPITAL COURSE:  The patient presented electively on April 13, 2008.  He  underwent implantation of cardioverter-defibrillator on the same day.  He had no postprocedural complications, such as hematoma or  pneumothorax.  Leads appropriate in position in chest x-ray.  The device  was interrogated, all values within normal limits.  The patient did  require defibrillator threshold study, postprocedure day #1, on April 14, 2008.  This was followed in sequential fashion with a kyphoplasty by  Interventional Radiology.  The patient discharging with the admonition  to keep his arm quite for the next 3 days, incision should stay dry for  the next 6 days, and sponge bathe until Tuesday, April 20, 2008.   DISCHARGE MEDICATIONS:  1. Enteric-coated aspirin 81 mg daily.  2. Vytorin 10/20 daily at bedtime.  3. TriCor 145 mg daily.  4. Allopurinol 100 mg daily.  5. Lisinopril 5 mg daily.  6. Coreg 6.25 mg  twice daily.  7. Tramadol 50 mg 1 tablet every 4 hours as needed.   FOLLOWUP:  1. Followup at [facility]; [address] [place] [address], [facility]      [facility] and carotid ultrasound to be combined at the same visit,      Thursday, April 29, 2008, at 1 p.m. for the ultrasound and 2 p.m. for      the [facility].  2. To see Dr. Graciela Husbands, Tuesday, July 20, 2008, at 9:20.  3. Follow up for kyphoplasty per Interventional Radiology.      Maple Mirza, [place]      Duke Salvia, MD, [facility]  Electronically Signed    GM/MEDQ  D:  04/14/2008  T:  04/15/2008  Job:  [id number]   cc:   Lonzo Cloud. Kriste Basque, MD

## 2011-03-06 NOTE — Op Note (Signed)
NAMEDAMARIAN, PRIOLA NO.:  [id number]   MEDICAL RECORD NO.:  [id number]          PATIENT TYPE:  OIB   LOCATION:  2021                         FACILITY:  [facility]   PHYSICIAN:  Duke Salvia, MD, FACCDATE OF BIRTH:  Sep 14, 1931   DATE OF PROCEDURE:  04/29/2008  DATE OF DISCHARGE:                               OPERATIVE REPORT   PREOPERATIVE DIAGNOSIS:  Previously implanted implantable cardioverter-  defibrillator with right ventricular lead failure.   POSTOPERATIVE DIAGNOSES:  1. Previously implanted implantable cardioverter-defibrillator with      right ventricular lead failure.  2. 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  3. 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   PROCEDURE:  Contrast venography, explantation of a previously implanted  lead, insertion of a new lead, and intraoperative defibrillation  threshold testing.   Following the obtained informed consent, the patient was brought to the  Electrophysiology Laboratory and placed on the fluoroscopic table in the  supine position.  Chest x-ray demonstrated no significant movement of  the lead.  Fluoroscopic evaluation demonstrated a little bit more lead  motion than I would have anticipated.   After routine prep and drape, lidocaine was infiltrated along the line  of previous incision.  The incision was opened and carried down to the  layer of device pocket and a small amount of hematoma was removed.  The  device was explanted and the RV lead was freed up.  A stiffer stylet was  placed.  Counterclockwise rotation on the stylet was applied with  movement of the screw and then with counterclockwise rotation of the  lead.  The lead then freed up and pulled into the right ventricle.  We  then attempted to redeploy this lead in the RV.  Unfortunately, I found  its movement to be unidirectional, that is I could pull it back, but I  could not advance it.  We then  used a stiffer stylet to try to see if we  could advance it and we could not.  It turned out that this was related  to a stricture near the junction of the innominate vein of the superior  vena cava, which precluded forward motion of the proximal coil.  At this  point, we elected to put in a new lead.  We attempted to cannulate the  subclavian vein.  I punctured the artery on one occasion.  I then  punctured the vein.  I passed a micropuncture wire and it coiled.  We  withdrew it a little bit, and we then took a contrast venogram of the  left subclavian vein and the vein was totally occluded.  We then chose  to sacrifice the previously implanted lead as it would provide our  conduit for a lead that we could position.  This turned out to be done  without any significant difficulty.  A long 9-French sheath was placed,  and through this was passed a Medtronic 6947 Quattro lead, serial number  [id number] venous injector.  Under fluoroscopic guidance, it  was  manipulated to a number of positions in the right ventricular apex and  in its final location, the bipolar R-wave was 14.3 with a pacing  impedance of 730 ohms, and a threshold 0.6 volts at 0.5 milliseconds.  Current threshold was 1.1 mA and the current of injury was brisk.  This  lead was secured to the prepectoral fascia and the leads were then  attached back to the previously implanted ICD, which was a Maximal II  DR, model T84DRG, serial number [id number].  Through the device, the  bipolar P-wave was 1.5 with a pace impedance of 437, threshold of 0.5  volts at 0.5 milliseconds.  The R-wave was 7.3 with an impedance of 591,  a threshold of 1 volt at 0.2, not withstanding the diminution of the R-  wave, the current of injury was so brisk, we elected to leave the lead  where it was.   Defibrillation threshold testing was then undertaken.  Ventricular  fibrillation was induced via the T-wave shock.  After a total duration  of 7 seconds,  a 20-joule shock was delivered through a measured  resistance of 40 ohms terminating the ventricular fibrillation and  restoring sinus rhythm.  The device was implanted.  The pocket was  copiously irrigated with antibiotic-containing saline solution.  Hemostasis was assured, and Surgicel was used on the posterior aspect of  the pocket just because it is a little bit friable from the previously  identified hematoma.  The leads and pulse generator were placed in the  pocket and secured to the prepectoral fascia.  The wound was closed in 3  layers in normal fashion.  The wound was washed and dried, and a benzoin  Steri-Strip dressing was applied.  Needle counts, sponge counts, and  instrument counts were correct at the end of procedure according to  staff.  The patient tolerated the procedure without apparent  complication.      Duke Salvia, MD, [facility]  Electronically Signed     SCK/MEDQ  D:  04/29/2008  T:  04/30/2008  Job:  [id number]   cc:   Electrophysiology Laboratory

## 2011-03-06 NOTE — Discharge Summary (Signed)
NAMELEONE, PUTMAN NO.:  [id number]   MEDICAL RECORD NO.:  [id number]          PATIENT TYPE:  OIB   LOCATION:  2021                         FACILITY:  [facility]   PHYSICIAN:  Duke Salvia, MD, FACCDATE OF BIRTH:  01-Feb-1931   DATE OF ADMISSION:  04/29/2008  DATE OF DISCHARGE:  04/30/2008                               DISCHARGE SUMMARY   ALLERGIES:  He does have an allergy to LISINOPRIL that makes him dizzy.   FINAL DIAGNOSES:  1. Dislodged right ventricular implantable cardioverter-defibrillator      lead found on office visit April 29, 2008, at the interrogation of      device.  2. Revision of the right ventricular implantable cardioverter-      defibrillator lead following extraction of old right ventricular      lead for failure.   SECONDARY DIAGNOSES:  1. History of prior myocardial infarction with large inferolateral      scar.  2. Stent to the left anterior descending.  3. Ischemic cardiomyopathy, ejection fraction 40%.  4. Cardioverter-defibrillator implanted April 13, 2008.  5. Defibrillator threshold study April 14, 2008.  6. Kyphoplasty for T12 fracture April 14, 2008.  7. Syncope which occasioned implantable cardioverter-defibrillator      implant.  8. Chronic obstructive pulmonary disease.  9. Gout.  10.History of skin cancer.  11.New York Heart Association class II,  12.Chronic systolic congestive heart failure.   PROCEDURE:  April 29, 2008, extraction of failed RV lead with implantation  of a new RV lead to existing cardioverter-defibrillator by Dr. Sherryl Manges.   BRIEF HISTORY:  Mr. Slaymaker is a 77 year old gentleman.  He has a history  of myocardial infarction and previous stent to the LAD.  His ejection  fraction is 40%.  He had an ICD implanted April 13, 2008, for myocardial  infarction with presence of large inferolateral scar, depressed ejection  fraction and history of syncope.   The patient presents to [facility] on April 29, 2008, for device  parameter interrogation.  He was found to have a dislodged lead which  required revision.  This will be done today in the Electrophysiology Lab  April 29, 2008, if this is a [employer] device.   HOSPITAL COURSE:  The patient presents from the office with a defective  RV lead possibly secondary to retraction, but also possibly secondary to  perforation.  The patient's lead was extracted.  In the  Electrophysiology Lab a new lead was implanted.  The patient discharging  postprocedure day #1.  Chest x-ray has been examined.  The patient is  ready for discharge.  He was discharged on the following medications.  1. Coreg 6.25 mg twice daily.  2. Enteric-coated aspirin 81 mg daily.  3. Vytorin 10/20 daily at night.  4. TriCor 145 mg daily.  5. Allopurinol 100 mg daily.  6. Ultram 50 mg 1-2 tablets every 6 hours as needed.  7. He is asked to stop his lisinopril which he has actually done on      his own secondary to dizziness.  He is asked to keep his incision dry for the next 7 days and to sponge  bathe until Thursday May 06, 2008.  He is to follow-up with [facility], the ICD clinic, [facility] office Wednesday May 19, 2008,  at 9:20.   Laboratory studies this admission drawn on April 29, 2008,  hemoglobin  10.9, hematocrit 31.5, white cells 5.8, platelets are 171.  Sodium 133,  potassium 4.1, chloride 100, carbonate 27, BUN is 11, creatinine 0.87,  glucose is 79.  Protime 14.2, INR 1.1.      Maple Mirza, [place]      Duke Salvia, MD, [facility]  Electronically Signed    GM/MEDQ  D:  04/30/2008  T:  05/01/2008  Job:  [id number]   cc:   [name], MD, [facility]  Duke Salvia, MD, [facility]  Scott M. Kriste Basque, MD

## 2011-03-07 NOTE — Telephone Encounter (Signed)
OK to hold PM meds.

## 2011-03-08 NOTE — Telephone Encounter (Signed)
Pt aware OK to continue to hold medications.  A follow up appt was made for him at the next available appt.

## 2011-03-09 NOTE — Cardiovascular Report (Signed)
[place]. [facility]  Patient:    DELVON, CHIPPS Visit Number: [id number] MRN: [id number]          Service Type: CAT Location: [id number] Attending Physician:  Learta Codding Dictated by:   Lewayne Bunting, M.D. [facility] Proc. Date: 02/19/02 Admit Date:  02/19/2002 Discharge Date: 02/21/2002   CC:         Lorin Picket M. Kriste Basque, M.D. [facility]  [name], M.D. [facility]   Cardiac Catheterization  DATE OF BIRTH: 10-10-1931  REFERRING PHYSICIAN: Lonzo Cloud. Kriste Basque, M.D.  CARDIOLOGIST: [name], M.D.  PROCEDURES PERFORMED: 1. Left heart catheterization with selective coronary angiography. 2. Ventriculography.  DIAGNOSES: 1. Single-vessel coronary artery disease with high-grade stenosis of the    left anterior descending. 2. Normal left ventricular systolic function.  INDICATIONS: The patient is a 77 year old male with a history of peripheral vascular disease, status post abdominal aortic aneurysm repair and possible stent placement to the renal arteries. The patient was seen by Dr. Antoine Poche in the office for increased shortness of breath on exertion. A Cardiolite study was positive with ischemia in the anterior wall and apex, as well as the inferoapical. The patient was immediately referred for a diagnostic catheterization to assess his coronary anatomy.  DESCRIPTION OF PROCEDURE: After informed consent was obtained, the patient was brought to the catheterization laboratory.  The right groin was sterilely prepped and draped.  Lidocaine 1% was infiltrated. The 6 French arterial sheath was placed using modified Seldinger technique.  Subsequently, a 6 [place] JL4 catheter was used to engage the left  coronary system. A right Amplatz II was used to engage the right nondominant coronary ostium. Selective coronary angiography was performed in various projections using manual injections of contrast. Following selective coronary angiography a pigtail catheter was placed in  the left ventricular cavity and ventriculography was performed using power injection of contrast. Appropriate left-sided hemodynamics were obtained. The pigtail catheter was then pulled back and distal aortography was performed. At the termination of the procedure, all catheters and sheaths were removed and the patient was brought back to the holding area.  FINDINGS:  HEMODYNAMICS: Left ventricular pressure 170/10 mmHg, aortic pressure 177/73 mmHg.  VENTRICULOGRAPHY: Ejection fraction 70%. No significant mitral regurgitation. No gradient on aortic pullback.  SELECTIVE CORONARY ANGIOGRAPHY: 1. Left main coronary is a large caliber vessel, somewhat short but with    no evidence of flow-limiting coronary artery disease. 2. Left anterior descending artery is a large caliber vessel wrapping around    the apex. The proximal segment of the LAD has 50% stenosis which is then    followed by tandem lesion of 90%. There is also an 80% stenosis in the mid    LAD following a diagonal branch. The remainder of the LAD is free of    flow-limiting coronary artery disease. 3. Circumflex coronary artery is dominant. This is a large caliber vessel.    There is some diffuse like irregularity in the proximal vessel. There is a    very large second obtuse marginal branch which bifurcates in a    superior and inferior limb just prior to the bifurcation. There is diffuse    50% stenosis. The remainder of the circumflex coronary artery is free of    flow-limiting disease. The circumflex coronary artery terminates in a very    large posterior descending artery. 4. Right coronary artery is nondominant and is small in caliber and is free    of flow-limiting coronary artery  disease.  DISTAL AORTOGRAM: Demonstrates no significant residual aneurysm. There is no evidence of high-grade renal artery stenosis.  RECOMMENDATIONS: Angiographic images were reviewed with Dr. Antoine Poche. The patient appears to have  high-grade stenosis of the LAD, which is amenable to percutaneous coronary intervention. Dr. Antoine Poche will discuss with the patienT the need for intervention. It is Dr. Lindaann Slough feeling the patient should proceed with placement of a bare metal stent and not await use of a drug-coated stents due to the acuity of the situation. He will further discuss this with the patient. Potentially, the patient has been scheduled for coronary intervention with Dr. Riley Kill in the morning. The patient has been started in the interim on Plavix. Dictated by:   Lewayne Bunting, M.D. [facility] Attending Physician:  Learta Codding DD:  02/19/02 TD:  02/23/02 Job: [id number]

## 2011-03-09 NOTE — Assessment & Plan Note (Signed)
[facility]                            CARDIOLOGY OFFICE NOTE   NAME:[name], [place]                         MRN:          [id number]  DATE:01/07/2007                            DOB:          [birth year]    REASON FOR PRESENTATION:  Evaluate the patient with coronary disease.   HISTORY OF PRESENT ILLNESS:  The patient is a pleasant 77 year old  gentleman with coronary disease as described below.  Recently, he was  referred by Dr. Kriste Basque because of his ongoing risk factors and also  because of some dyspnea.  He did have some dyspnea climbing an incline.  However, he does not have overt symptoms to include chest discomfort,  neck or arm discomfort.  He has no resting shortness of breath.  Denies  any PND or orthopnea.  He thinks his dyspnea is not limiting.  He gets  along well.   However, I did send him for a stress perfusion study.  This suggested  that his EF was a little less than 40% with an inferior infarct.  I did  perform an echocardiogram which confirms that the ejection fraction is  actually lower than before at 40% with hypokinesis of the posterior  wall.  He now presents to follow up on this.  Again, he is denying  symptoms as above.   PAST MEDICAL HISTORY:  1. Coronary artery disease (proximal LAD 50% stenosis, mild 90%      followed by 80% stenosis, followed by 50% stenosis.  He had      stenting of his mid LAD.  He had 50% long circumflex lesion.  His      EF was normal on catheterization in 2003).  2. Hypertension x10 years.  3. Hyperlipidemia x10 years.  4. Abdominal aortic aneurysm status post repair.   ALLERGIES:  None.   MEDICATIONS:  1. Aspirin 81 mg daily.  2. Vytorin 10/20 daily.  3. Tri-Chlor 145 mg daily.  4. Tarka 2/240 mg daily.  5. Allopurinol.   REVIEW OF SYSTEMS:  As stated in the HPI, and otherwise, negative for  other systems.   PHYSICAL EXAMINATION:  GENERAL:  The patient is in no distress.  VITAL SIGNS:   Blood pressure 110/66, heart rate 90 and regular, weight  187 pounds, body mass index 26.  HEENT:  Eyes:  Unremarkable.  Pupils equal, round and reactive to light.  Fundi not visualized.  Oral mucosa unremarkable.  NECK:  No jugular venous distention at 45 degrees.  Carotid upstroke  brisk and symmetric.  No bruits or thyromegaly.  LYMPHATICS:  No cervical, axillary or inguinal adenopathy.  LUNGS:  Clear to auscultation bilaterally.  BACK:  No costovertebral angle tenderness.  CHEST:  Unremarkable.  HEART:  PMI nondisplaced or sustained.  S1, S2 within normal limits.  No  S3, S4, clicks, rubs, murmurs.  ABDOMEN:  Well healed surgical scar.  Positive bowel sounds.  Normal in  frequency and pitch.  No bruits, rebound, guarding, midline pulse.  No  mass or organomegaly.  SKIN:  No rashes, no nodules.  EXTREMITIES:  DP and  posterior tibialis 2+ bilaterally.  Bilateral  femoral bruits.  No cyanosis, clubbing.  Dependent rubor.  NEUROLOGICAL:  Oriented to person, place and time.  Cranial nerves  grossly intact.  Motor grossly intact.   ASSESSMENT/PLAN:  1. Coronary disease.  The patient has had a change in his Cardiolite.      Probably had a silent myocardial infarction.  There does not appear      to be ischemia elsewhere.  At this point, I discussed with him the      benefits of cardiac catheterization which I suggest.  We do not      know exactly what is going on with his coronary anatomy.  He is      going to think this over and get back to me.  In the meantime, we      do need to structure his medications differently for his reduced      ejection fraction.  I am going to stop Tarka but continue ACE      inhibitor.  I will use Lisinopril starting at a low dose of 5 mg.      I would also like to use a beta blocker with Coreg 3.125 mg b.i.d.      We discussed potential side effects of this.  I have encouraged him      to stop smoking cigars.  2. Risk reduction.  His lipids are  excellent, followed by Dr. Kriste Basque.      Again, we talked about stopping cigars.  3. Followup.  I will see him back in two weeks for medication      titration, and hopefully, he will also consent for cardiac      catheterization.     [name], MD, [facility]  Electronically Signed    JH/MedQ  DD: 01/07/2007  DT: 01/07/2007  Job #: [id number]   cc:   Lonzo Cloud. Kriste Basque, MD

## 2011-03-09 NOTE — Assessment & Plan Note (Signed)
HEALTHCARE                            CARDIOLOGY OFFICE NOTE   NAME:FLOYDJulio, Zappia                         MRN:          [id number]  DATE:01/21/2007                            DOB:          25-Jun-1931    PRIMARY:  Dr. Kriste Basque.   REASON FOR PRESENTATION:  Evaluate patient with an abnormal Cardiolite.   HISTORY OF PRESENT ILLNESS:  This patient is 76 years old.  He returns  for follow up of his known coronary disease and his abnormal Cardiolite.  At the last visit we discussed catheterization, which he declined.  We  are pursuing medical management for his slightly reduced ejection  fraction.  I did start Lisinopril at 5 mg daily and Coreg 3.125 mg  b.i.d.  I stopped the [place].  He has had no problems with this.  He  denies any chest pain.  He has had no neck or arm discomfort.  He has no  palpitations, presyncope or syncope.  He denies any shortness of breath.   Of note, he says that he had a contusion to his chest some time ago and  wonders if this could have precipitated his declining ejection fraction  and we talked about this.   PAST MEDICAL HISTORY:  Coronary artery disease (proximal LAD 50%  stenosis, mild 90% followed by 80% stenosis.  He had stenting of his mid  LAD.  He had 50% long circumflex lesion.  The EF was normal on  catheterization in 2003.  The stress perfusion study done earlier this  year demonstrated the EF to be 36%.  There was a large inferior wall  infarct from base to apex.  Echo confirm the reduced ejection fraction  of about 40%), hypertension x10 years, hyperlipidemia, abdominal aortic  aneurysm status post repair.   ALLERGIES:  None.   MEDICATIONS:  1. Aspirin 81 mg daily.  2. Vytorin 10/20 daily.  3. Tricor 145 mg daily.  4. Allopurinol 100 mg daily.  5. Lisinopril 5 mg daily.  6. Coreg 3.125 mg b.i.d.   REVIEW OF SYSTEMS:  As stated in the history of present illness and  negative for other systems.   PHYSICAL  EXAMINATION:  The patient is in no distress.  Blood pressure 129/69, heart rate 74 and regular, weight 184 pounds,  body mass index 25.  HEENT:  Eyes unremarkable, pupils equal, round and reactive to light,  fundi not visualized, oral mucosa unremarkable.  NECK:  No jugular venous distension at 45 degrees, carotid upstroke  brisk and symmetric, no bruits, no thyromegaly.  LYMPHATICS:  No adenopathy.  LUNGS:  Clear to auscultation bilaterally.  BACK:  No costovertebral angle tenderness.  CHEST:  Unremarkable.  HEART:  PMI not displaced or sustained, S1 and S2 within normal limits,  no S3, no S4, no clicks, no rubs, no murmurs.  ABDOMEN:  A well healed surgical scar, positive bowel sounds normal in  frequency and pitch, no bruits, rebound, guarding and no midline  pulsatile mass, no organomegaly.  SKIN:  No rashes, no nodules.  EXTREMITIES:  Have 2+ posterior tibialis and dorsalis pedis  bilaterally,  bilateral femoral bruits, no cyanosis, no clubbing, 2+ upper pulses,  dependent rubor.  NEURO:  Grossly intact.   ASSESSMENT/PLAN:  1. Coronary disease.  The patient, again, does not want cardia      catheterization as he is asymptomatic.  We will continue with risk      reduction.  He is, unfortunately, still smoking cigars.  Today I am      going to increase his Coreg to 6.25 mg b.i.d. with a goal of 25 mg      b.i.d.  A goal lisinopril dose will be 20 mg to 40 mg daily.  He      did get a BMET with the initiation of lisinopril and it was fine.  2. Dyslipidemia.  He has a excellent lipid profile with an HDL of 40.9      and an LDL of 70, with triglycerides of 100.  He will continue the      current regimen.  3. Reduced ejection fraction as above.  4. Follow up.  We will see him back in about 4 weeks for the next      medication titration.     [name], MD, [facility]  Electronically Signed    JH/MedQ  DD: 01/21/2007  DT: 01/22/2007  Job #: [id number]   cc:   Lonzo Cloud. Kriste Basque, MD

## 2011-03-09 NOTE — Discharge Summary (Signed)
[place]. [facility]  Patient:    [name] Visit Number: [id number] MRN: [id number]          Service Type: CAT Location: 6500 [id number] Attending Physician:  [name] Dictated by:   [name], P.A.-C. Admit Date:  02/19/2002 Disc. Date: 02/21/02   CC:         [name], M.D. [name]   Referring Physician Discharge [place]  DATE OF BIRTH:  Apr 29, 1931  ADMITTING PHYSICIAN:  Dr. Andee [name].  DISCHARGING PHYSICIAN:  Dr. Antoine [name].  SUMMARY OF HISTORY:  [name] is a 77 year old white male, who, because of multiple risk factors, Dr. Kriste [name] performed a stress Cardiolite.  This revealed an EF of 56% and for apical inferoseptal and inferior wall ischemia.  A resting study was not performed.  He did not have any EKG changes or associated symptoms.  [name] does relate increased dyspnea with exertion; however, he denies any actual chest discomfort.  He does have a history of hypertension, hyperlipidemia, questionable renal artery stenosis with stent placement in the past, hemorrhoids, elevated liver enzymes, rosacea, diverticulosis, polyps, ETOH use, and remote tobacco but current cigar use.  LABORATORY DATA:  Admission PTT 27, PT 13.1.  Sodium 139, potassium 3.4,bun 12, creatinine 1.3.  H&H 14.4 and 41.1.  MCV was slightly elevated at 101.8, platelets 123, WBC 4.9.  Prior to discharge H&H was 13.6 and 38.5, platelets 110, WBC 6.2.  Potassium 3.3, BUN and creatinine 7 and 1.3, sodium 140, glucose 96.  EKGs show sinus bradycardia, T wave inversion in V1 through V4 which is felt to be old.  HOSPITAL COURSE:  [name] was admitted to [facility].  He underwent cardiac catheterization on Feb 19, 2002, by Dr. Andee [name].  According to his progress note, he had an ostial 50% LAD; focal 90% proximal LAD; mid 80% LAD; proximal 20-30% circumflex; 40-50% OM-1; RCA was nondominant.  EF was 70%. After reviewing the high-grade LAD disease, issues were  discussed with [name] and the patient.  [name] performed angioplasty stenting to the LAD.  [name] noted that there was a side branch that was occluded with the stenting.  Dr. Andee [name] also noted that his platelets were decreased prior to receiving any heparin.  He suggested comparing to prior office data. Some records were obtained from the office; however, this did not include a platelet count.  Dr. Andee [name] noted that he may need follow-up with hematology. Postsheath removal and bed rest, he was ambulating without difficulty. Catheterization site intact.  On Feb 21, 2002, it was noted that his potassium was slightly decreased; thus, he received supplementation prior to his discharge.  DISCHARGE DIAGNOSES: 1. Dyspnea on exertion. 2. Positive stress Cardiolite. 3. Catheterization as previously described showing coronary artery disease,    status post angioplasty stenting to the left anterior descending. 4. History as previously.  DISPOSITION:  He is discharged home.  DISCHARGE MEDICATIONS: 1. Plavix 75 mg q.d. x 4 weeks. 2. Coated aspirin 325 mg q.d. 3. Tri-Chlor 160 mg q.d. 4. Verapamil 40 mg q.a.m., 20 mg q.p.m. 5. Allopurinol 100 mg q.d. 6. Zetia 10 mg q.d. 7. Sublingual nitroglycerin p.r.n.  DISCHARGE INSTRUCTIONS: 1. He was advised no lifting, driving, sexual activity, or heavy exertion for    two days. 2. Maintain a low salt, low fat, low cholesterol diet. 3. If he has any problems with his catheterization site, he was asked to call    [place]. 4. He  was asked to call our [name] office to arrange a 2-3 week    appointment with Dr. Antoine [name].  At that time, attempts will be made to    obtain old laboratory values to determine if his thrombocytopenia is new    and if he needs a hematology evaluation. Dictated by:   [name], P.A.-C. Attending Physician:  [name] DD:  02/21/01 TD:  02/21/02 Job: [id number]

## 2011-03-09 NOTE — Cardiovascular Report (Signed)
[place]  Patient:    RUCKER, PRIDGEON Visit Number: [id number] MRN: [id number]          Service Type: CAT Location: [id number] Attending Physician:  Learta Codding Dictated by:   Arturo Morton. Riley Kill, M.D. [facility] Proc. Date: 02/20/02 Admit Date:  02/19/2002 Discharge Date: 02/21/2002   CC:         [name], M.D. [facility]  Scott M. Kriste Basque, M.D. [facility]  CV Laboratory   Cardiac Catheterization  INDICATIONS: The patient is a 77 year old, who underwent exercise Cardiolite imaging demonstrating significant defect in the distribution of the left anterior descending artery. This involved the septal and the mid and distal anterior wall. He did not have chest pain but did have some ST segment changes. He was seen by Dr. Antoine Poche and subsequently referred for cardiac catheterization. Dr. Andee Lineman performed cardiac catheterization and this revealed a high-grade stenosis in tandem fashion in the left anterior descending artery. A decision was made to recommend percutaneous coronary intervention. I discussed the case with the patient in detail and he was agreeable to proceed.  PROCEDURE: Percutaneous stenting of the left anterior descending artery.  DESCRIPTION OF PROCEDURE: The procedure was performed from the left femoral artery. Angiomax was given as the patient has mild thrombocytopenia. With adequate anticoagulation, a JL 3.5 guiding catheter was utilized to take guiding views of the left coronary. We crossed the lesion with a BMW wire and then predilated using a 2.5 mm balloon. Following this, a 28 x 2.5 mm Guidant Pixel stent was placed in the LAD and taken up to about 16 atmospheres. There was marked improvement in the appearance of the artery, although the distal end of the stent ended in an area of eccentric plaquing. As a result, we placed the second stent 2.8 mm x 2.75 Guidant, Zeta stent at that location. The lesion was then dilated retrograde with  post-dilatation using a 2.75 mm balloon with marked improvement in the appearance of the artery. This balloon was then removed. There was dramatic appearance of the artery. There was a small to moderate septal perforator with side branch occlusion, but no symptoms. In addition to this, there was some segmental disease involving the  ostium of about 50%, but I reviewed this with Dr. Chales Abrahams and it was our feeling that this should not be treated as treating it would likely lead to compromise of the ostium of the circumflex. Intracoronary nitroglycerin was used during the course of the procedure. All catheters were subsequently removed and the femoral sheath sewn into place. He was taken to the holding area in satisfactory clinical condition where intravenous labetalol was administered for elevated blood pressure and he was started on a nitroglycerin drip.  ANGIOGRAPHIC DATA: The left main coronary artery is short. The LAD has about 50% segmental disease at the ostium. Beyond the origin of two diagonal branches, there is a 90% stenosis followed by a second stenosis after a septal of about 80%. The whole mid area was stented and reduced from about 90% and 80% down to 0% with excellent distal runoff. The small septal perforator had slow to no flow afterwards without symptoms. The area of the proximal LAD more proximally had residual 50% narrowing. In addition, the small diagonal had about 50% narrowing proximally, and the small proximal septal had about 80% narrowing. All of these were very small branches.  CONCLUSIONS: 1. Successful percutaneous stenting of the left anterior descending artery as    described in  the above text. 2. Unchanged residual disease of the circumflex system with moderate segmental    plaquing proximally, in the proximal marginal branch, and distally.  RECOMMENDATIONS: The patient will need risk factor reduction. Cardiac rehabilitation would be recommended. Dictated  by:   Arturo Morton Riley Kill, M.D. [facility] Attending Physician:  Learta Codding DD:  02/20/02 TD:  02/23/02 Job: [id number]

## 2011-03-09 NOTE — Assessment & Plan Note (Signed)
[place] [facility]                            CARDIOLOGY OFFICE NOTE   NAME:[name], [name]                         MRN:          [id number]  DATE:10/31/2006                            DOB:          [birth year]    REASON FOR PRESENTATION:  Patient with coronary disease.   HISTORY OF PRESENT ILLNESS:  Patient presents for followup of the above.  He had a cardiac catheterization in 2003 with a stent placed.  This is  described below.  He really has not been back since that time.  He has  followed up with Dr. Kriste Basque, and has had aggressive secondary risk  reduction.  At the time he presented, he had dyspnea and failed a stress  test with anterior perfusion defect.  Afterwards, he said his dyspnea  improved.  He is able to mow the lawn and rake leaves.  He gets no  significant shortness of breath.  Denies any PND or orthopnea.  He has  no palpitations, presyncope or syncope.  He has no chest pain.  Unfortunately, he is still smoking cigars.  He does not walk routinely,  and gets his exercise only when he is doing yard work.   PAST MEDICAL HISTORY:  Coronary artery disease (proximal LAD 50%  stenosis, mid 90 followed by 80% stenosis followed by 50% stenosis.  He  had stenting of his mid LAD.  He had a 50% long circumflex lesion.  His  EF was well preserved.)  Hypertension x10 years, hyperlipidemia x10  years, abdominal aortic aneurysm repaired.   ALLERGIES:  NONE.   MEDICATIONS:  1. Vytorin 10/20.  2. Tricor 145 mg daily.  3. Tarka 2/240 daily.  4. Allopurinol 100 mg daily.  5. Aspirin 81 mg daily.   SOCIAL HISTORY:  The patient is married.  He has 3 children.  He is a  retired Clinical research associate.  Smokes 5 cigars, has smoked for 54 years.   FAMILY HISTORY:  Noncontributory for early coronary artery disease.   REVIEW OF SYSTEMS:  As stated in the HPI.  Positive for gout.  Positive  for constipation.  Negative for other systems.   PHYSICAL EXAMINATION:  Patient is in  no distress with blood pressure  125/80.  Heart rate 77 and regular.  Weight 189 pounds, body mass index  25.  HEENT:  Eyes unremarkable.  Pupils equal, round and reactive to light.  Fundi within normal limits.  Oral mucosa unremarkable.  NECK:  No jugular venous distention.  Wave form within normal limits.  Carotid upstroke brisk and symmetric.  No bruits.  No thyromegaly.  LYMPHATICS:  No cervical, axillary or inguinal adenopathy.  LUNGS:  Clear to auscultation bilaterally.  BACK:  No costovertebral angle tenderness.  CHEST:  Unremarkable.  HEART:  PMI not displaced or sustained.  S1 and S2 within normal limits.  No S3.  No S4.  No murmurs.  ABDOMEN:  Flat, positive bowel sounds.  Normal in frequency and pitch.  No bruits.  No rebound.  No guarding.  No midline pulsatile mass.  No  hepatomegaly.  No splenomegaly.  SKIN:  No rashes.  No nodules.  EXTREMITIES:  Two plus pulses throughout.  No edema.  No cyanosis.  No  clubbing.  Deep tendon rubor.  NEUROLOGIC:  Oriented to person, place and time.  Cranial nerve 2  through 12 grossly intact.  Motor grossly intact throughout.   EKG sinus rhythm, rate 72.  Axis within normal limits.  Intervals within  normal limits.  No acute ST or T wave changes.   ASSESSMENT AND PLAN:  1. Coronary disease.  The patient has coronary disease as described.      He is not having any symptoms at all.  At this point I do think      stress perfusion testing or other is indicated.  However, he needs      to continue aggressive risk reduction.  We had a long discussion      about this.  2. Risk reduction.  As above.  We discussed this at length.  I want      him to quit cigars all together and  all tobacco products.  He may      try to do this.  I also prescribed for him a specific walking      regimen based on a target heart rate.  He will continue to have      excellent lipid managed by Dr. Kriste Basque with a goal LDL in the 70s and      HDL in the 40s.  His  blood pressure is well controlled.  He will      continue the medications as listed.  3. Followup.  I will see him back in about 18 months.  However, he      knows to let me know if he has any decrease in exercise tolerance,      increased dyspnea or any other symptoms that could be an anginal      equivalent.     [name], MD, [facility]  Electronically Signed    JH/MedQ  DD: 10/31/2006  DT: 10/31/2006  Job #: [id number]   cc:   Lonzo Cloud. Kriste Basque, MD

## 2011-03-09 NOTE — Assessment & Plan Note (Signed)
[place] [facility]                            CARDIOLOGY OFFICE NOTE   NAME:[name], [name]                         MRN:          [id number]  DATE:02/26/2007                            DOB:          07-04-1931    PRIMARY CARE PHYSICIAN:  Dr. Alroy Dust.   REASON FOR PRESENTATION:  Evaluate patient with cardiomyopathy.   HISTORY OF PRESENT ILLNESS:  The patient returns for followup.  He is 77  years old.  At last visit I increased his Coreg to 6.25 mg b.i.d.  He  had one episode of orthostatic hypotension in the middle of the night  when he got up to go to the bathroom.  It was somewhat severe but he sat  down and stayed himself.  Since then he has not jumped up that quickly.  He has had no syncope.  He otherwise during the day feels no  lightheadedness.  He has no chest or neck discomfort.  He has no  palpitations.  He has no shortness of breath, no PND, or orthopnea.   PAST MEDICAL HISTORY:  1. Coronary artery disease (proximal LAD 50% stenosis, mid 90%      followed by 80% stenosis.  He had stenting of his mid-LAD.  He had      a 50% long circumflex lesion.  EF ws normal at catheterization in      2003.).  2. Cardiomyopathy (EF on Cardiolite 36% with anterior infarct and no      ischemia).  3. Hypertension x10 years.  4. Dyslipidemia.  5. Aortic aneurysm, status post repair.  6. Ongoing cigar use.   ALLERGIES:  NONE.   MEDICATIONS:  1. Aspirin 81 mg daily.  2. Vytorin 10/20 daily.  3. Tricor 145 mg daily.  4. Allopurinol 100 mg daily.  5. Lisinopril 5 mg daily.  6. Coreg 6.25 mg b.i.d.   REVIEW OF SYSTEMS:  As stated in the HPI and otherwise negative for  other systems.   PHYSICAL EXAMINATION:  The patient is in no distress.  Blood pressure  110/62, heart rate 70 and regular, weight 184 pounds, body mass index  25.  HEENT:  Eyelids unremarkable, pupils equally round and reactive to  light, fundi not visualized, oral mucosa unremarkable.  NECK:  No jugular venous distention, wave form within normal limits,  carotid upstroke brisk and symmetrical, no thyromegaly.  LYMPHATICS:  No cervical, axillary, inguinal adenopathy.  LUNGS:  Clear to auscultation bilaterally.  BACK:  No costovertebral angle tenderness.  CHEST:  Unremarkable.  HEART:  PMI not displaced or sustained, S1 and S2 within normal limits,  no S3, no S4, no clicks, no rubs, no murmurs.  ABDOMEN:  Obese, positive bowel sounds normal in frequency and pitch, no  bruits, no rebound, no guarding, no midline pulsatile mass, no  organomegaly.  SKIN:  No rashes, no nodules.  EXTREMITIES:  With 2+ pulses, no edema.   EKG sinus rhythm, old inferior infarct, left atrial enlargement, T wave  inversion in the lateral leads unchanged from previous.   ASSESSMENT/PLAN:  1. Cardiomyopathy, the patient still  does not want a cardiac      catheterization.  He wants to pursue medical management.  Toward      that end I am going to increase his Coreg to 9.275 mg b.i.d.  He      will continue the other medications as listed.  2. Tobacco, he has been educated about the need to stop smoking.  3. Orthostatic hypotension, we discussed maneuvers to prevent this      from happening.  He will let me know if he has any worsening      symptoms.  4. Coronary disease, he will continue with risk reduction.  His lipids      will be per Dr. Kriste Basque.  5. Followup, I will see him back in about 6 weeks for the next med      titration to see if we can get to 12.5 mg twice a day.     [name], MD, [facility]     JH/MedQ  DD: 02/26/2007  DT: 02/26/2007  Job #: [id number]   cc:   Lonzo Cloud. Kriste Basque, MD

## 2011-03-21 ENCOUNTER — Ambulatory Visit (INDEPENDENT_AMBULATORY_CARE_PROVIDER_SITE_OTHER): Payer: Medicare Other | Admitting: Pulmonary Disease

## 2011-03-21 ENCOUNTER — Encounter: Payer: Self-pay | Admitting: Pulmonary Disease

## 2011-03-21 DIAGNOSIS — I951 Orthostatic hypotension: Secondary | ICD-10-CM

## 2011-03-21 DIAGNOSIS — Z95810 Presence of automatic (implantable) cardiac defibrillator: Secondary | ICD-10-CM

## 2011-03-21 DIAGNOSIS — M549 Dorsalgia, unspecified: Secondary | ICD-10-CM

## 2011-03-21 DIAGNOSIS — T148XXA Other injury of unspecified body region, initial encounter: Secondary | ICD-10-CM

## 2011-03-21 DIAGNOSIS — I10 Essential (primary) hypertension: Secondary | ICD-10-CM

## 2011-03-21 DIAGNOSIS — I2510 Atherosclerotic heart disease of native coronary artery without angina pectoris: Secondary | ICD-10-CM

## 2011-03-21 DIAGNOSIS — I6529 Occlusion and stenosis of unspecified carotid artery: Secondary | ICD-10-CM

## 2011-03-21 DIAGNOSIS — I739 Peripheral vascular disease, unspecified: Secondary | ICD-10-CM

## 2011-03-21 DIAGNOSIS — E7800 Pure hypercholesterolemia, unspecified: Secondary | ICD-10-CM

## 2011-03-21 DIAGNOSIS — I2589 Other forms of chronic ischemic heart disease: Secondary | ICD-10-CM

## 2011-03-21 DIAGNOSIS — M1990 Unspecified osteoarthritis, unspecified site: Secondary | ICD-10-CM

## 2011-03-21 DIAGNOSIS — J449 Chronic obstructive pulmonary disease, unspecified: Secondary | ICD-10-CM

## 2011-03-21 DIAGNOSIS — J4489 Other specified chronic obstructive pulmonary disease: Secondary | ICD-10-CM

## 2011-03-21 NOTE — Progress Notes (Signed)
Subjective:    Patient ID: [name], male    DOB: 14-Nov-1930, 77 y.o.   MRN: [id number]  HPI  77 y/o WM here for a follow up visit... [name] has been followed closely by DrHochrein & DrKlein during the past years due to his cardiomyopathy & AICD...   ~  October 10, 2010:  Yearly ROV- doing well, [name] says w/o new complaints or concerns... states [name] had recent URI but toughed it out & better now... [name] saw DrKlein 6/11- f/u ischemic heart dis w/ decr LVF & AICD> noted some postural changes, hx syncope, on-going smoking, AICD was OK & no changes made...  [name] saw DrHochrein 8/11- cardiomyopathy, EF 40-45% on last 2DEcho, syncopal epis related to postural BP changes & [name] decreased his meds (Coreg3.125Bid & Lisin5Bid) & no prob since then...  [name] had CDopplers Q15mo w/ 60-79% bilat ICAstenoses (mod to severe mixed irreg plaque) on ASA daily & no cerebral ischemic symptoms...  [name]'s had some on-going LBP & XRay 8/11 w/o acute changes (prev T12 augmentation, NAD).Marland Kitchen. up to date on his vaccinations and doesn't need refill perscriptions today.  ~  February 16, 2011:  [name] had a 432mo f/u CDoppler 2/12 w/ worsening RICA velocities c/w 80-99% stenosis (stable 60-79% LICA stenosis);  [name] was evaluated by [facility] DrDickson & underwent a right CAE w/ DPA 3/12 without complications;  [name] has been doing satis post-op & they plan f/u CDoppler in 432mo intervals going forward...      On 02/09/11 [name] was bringing groceries into the house when his legs gave way & [name] fell backwards striking his occiput & right side on the ground;  ER eval revealed signif contusions, ?right rib fxs, hematoma on occiput, etc;  Work up included CXR, Lumbar films, CT Brain & CSpine> see results below;  Not adm & Rx as outpt w/ rest, heat, Vicodin, etc...  Family called w/ concern for his pain level, not eating or drinking enough, incr wheezing & SOB> we called in Pred dosepak, Proair inhaler, Mucinex 2Bid w/ fluids;  They report much improved on this rx> eating  better, more mobile, still w/ pain, bruise right flank, tender, etc; and they note weak, weak voice, some reflux symptoms, hard to swallow Tricor capsule, etc...    We discussed finishing out the Pred, continue IS (for deep breathing), Proair, Mucinex, rib binder, heating pad, Protonix, GI eval for swallowing by DrPatterson, lax meds as needed...  ~  May 30,2012:  [name] did not take the Protonix & didn't see GI as [name] felt that his reflux improved on its own & swallowing is OK now; [name] also feels that his voice has improved back to baseline; [name] has resumed some smoking (cigars) & Etoh according to his daughter who accompanies him today;  [name] has remained ?dizzy w/ postural BP changes and "passing out" [name] says related to his BP meds> prev on Lisinopril 5mg Bid & Coreg 3.125mg Bid- [name] decr to just one of each Qam in mid-May after talking to DrHochrein, but still had the afternoon symptoms so [name] stopped the meds completely one week ago;  BP today shows prominent postural BP changes w/ BP 130/70 supine & 100/60 sitting & standing...    Review of the record shows [name] last saw DrHochrein 8/11 w/ similar symptoms- syncopal spell at home, believed due to transient low BP, Lisinopril & Coreg adjusted at that time for his Cardiomyopathy> last 2DEcho was 4/10 showing mild LVH, mod reduced LVF w/ EF=40-45% w/ inferobasal &  post HK, mild MR, paradoxical septal motion;  We reviewed meds, Labs/ XRays/ ER note from 4/12...  Asked to leave the Lisinopril & coreg off for now, stop the smoking & Etoh, monitor BP when sitting/ standing, follow up w/ Cards ASAP...         Problem List:  COPD (ICD-496) - despite all efforts [name] continues to smoke 3-4 cigars per day... [name] has min cough, some phlegm, but denies CP, SOB, wheezing, etc...  [name] doesn't want Chantix or help w/ smoking cessation;  [name] has a PROAIR inhaler for Prn use but [name] seldom uses it, & [name] knows to use the OTC MUCINEX 1-2 Bid w/ fluids for congestion. ~  CXR 3/12 in hosp  for CAE showed COPD/E, biapical pleuroparenchymal scarring, NAD, AICD on left, old left rib fx, old T12 vertebroplasty... ~  Fall at home 4/12 w/ signif trauma & prob right rib fxs (CXR in ER showed some atelec but NAD & no rib films done).  HYPERTENSION (ICD-401.9) - prev on COREG 3.125mg Bid & LISINOPRIL 5mg Bid >> [name] weaned off these meds 5/12 due to dizziness & postural syncope... ~  12/11:  BP= 126/70 and doing well> denies HA, fatigue, visual changes, CP, palipit, dizziness, dyspnea, edema, etc; [name] has had postural hypotension & several syncopal episodes related to this- now improved w/ the final adjustment in his meds. ~  4/12:  BP= 90/58 ==>100/60 recheck & [name] is weak; asked to monitor BP at home & may need to decr meds. ~  5/12:  BP= 130/70 supine & 100/60 sitting & standing (no symptoms at present)> [name] has been off the Lisinopril & Coreg for 1wk now.  ATHEROSCLEROTIC HEART DISEASE (ICD-414.00) - on ASA 81mg /d + above meds... ISCHEMIC CARDIOMYOPATHY (ICD-414.8) Hx of SYNCOPE (ICD-780.2) & IMPLANTABLE DEFIBRILLATOR, DDD MDT (ICD-V45.02) ~  Cath in 2003 w/ 2 vessel CAD and stent placed in LAD...  ~  Cardiolite 1/08 w/ large inferolat infarct, no ischemia, EF=36%...  ~  2DEcho 2/08 w/ infer & post HK, EF=40%...  ~  recath 6/09 w/ heavily calcif vessels & 40% EF- DrHochrein has been following carefully and adjusting meds. ~  AICD placed 2009 by DrKlein for hx syncope & ischemic cardiomyopathy- followed by DrKlein yearly & doing satis. ~  2DEcho 4/10 showed mild LVH, mod reduced LVF w/ EF=40-45% w/ inferobasal & post HK, mild MR, paradoxical septal motion. ~  9/10: Cards tried to incr the Coreg to 9.375mg Bid but pt intol & went back to 6.25Bid. ~  8/11:  f/u by DrHochrein w/ recent syncopal episode & meds adjusted coreg 3.125Bid & Lisinopril 5Bid. ~  Serial XRays have all revealed signif atherosclerotic changes diffusely (eg- lumbar films 4/12, CT neck 4/12 as well). ~  4/12> ER visit for fall  at home w/ signif trauma> ?post BP related, ?med related, ?other etiology...  CEREBROVASCULAR DISEASE PERIPHERAL VASCULAR DISEASE (ICD-443.9) - on ASA 81mg /d...  ~  [name] is s/p AAA repair 2000 by [facility]... [name] is sedentary and hasn't had ABI's checked... I will leave this to DrHochrein... ~  CDopplers 7/10, 1/1,1 & 8/11 showed stable mod carotid dis bilat w/ heavy calcif plaque & 60-79% bilat ICA stenoses... ~  CDopplers 2/12 w/ worsening RICA velocities c/w 80-99% stenosis (stable 60-79% LICA stenosis);  [name] was evaluated by [facility] DrDickson & underwent a right CAE w/ DPA 3/12 without complications;  [name] has been doing satis post-op & they plan f/u CDoppler in 31mo intervals going forward... ~  NOTE:  CT  Brain 4/12 in ER showed mild to mod cortical vol loss & cbll atrophy, sm vessel dis, no acute changes... Prom vasc calcif seen on neck films & in abd...  HYPERCHOLESTEROLEMIA (ICD-272.0) - on SIMVASTATIN 40mg /d & TRICOR 145/d. ~  FLP 4/08 showed TChol 131, TG 100, HDL 41, LDL 70 ~  FLP 2/09 showed TChol 124, TG 132, HDL 37, LDL 61 ~  FLP 12/10 > pt never ret for FLP & insurance changed [facility] to [facility] ~  FLP 12/11 showed TChol 112, TG 51, HDL 43, LDL 59 ~  4/12:  They report some difficult swallowing the Tricor capsule==> referred to GI for swallowing eval & switched to FENOFIBRATE 160mg /d.  GERD/ DYSPHAGIA> ~  4/12: pt noted some reflux symptoms & mild dysphagia for large capsule; PROTONIX 40mg /d started & refer to GI for eval; they also note weak voice & may need ENT eval if not resolved in follow up... ~  5/12:  Pt states all symptoms resolved on their own, didn't take PPI, didn't see GI, denies swallowing or voice issues now.  DIVERTICULOSIS OF COLON (ICD-562.10) COLONIC POLYPS (ICD-211.3) HEMORRHOIDS (ICD-455.6) - last colonoscopy was 11/06 by DrPatterson showing divertics, several 1-38mm polyps (hyperplastic), and hems...  Hx of LIVER FUNCTION TESTS, ABNORMAL (ICD-794.8) - improved off  etoh... ~  labs 2/09 showed SGOT= 30, SGPT= 17 ~  labs 12/11 showed SGOT= 38, SGPT= 19  DEGENERATIVE JOINT DISEASE (ICD-715.90) Hx of GOUT (ICD-274.9) - on ALLOPURINOL 100mg /d... ~  labs 2/09 showed Uric= 3.3  OSTEOPOROSIS (ICD-733.00) - on Caltrate, MVI, Vit D... [name] had T12 compression after syncopal spell 2009 w/ vertebroplasty by DrDeveshwar...  BMD here 10/09 showed TScores +0.3 in Spine, & -2.7 in right FemNeck (Ortho eval by Janett Billow)... ~  labs 12/11 showed Vit D level = 22... rec to take Men's MVI + Vit D 2000 u daily.  DERMATITIS (ICD-692.9)  Health Maintenance -  ~  GI: colonoscopy 11/06 w/ several hyperplastic polyps removed... ~  GU: PSA 12/11 = 1.71 ~  Immunizations:  [name] tells me that [name] had PNEUMOVAX & 2010 Flu shot 10/10... received TETANUS shot here 2003...   Past Surgical History  Procedure Date  . Abdominal aortic aneurysm repair 2002    by Dr. Hart [place]  . Cad stent 02/2002    Dr. Antoine Poche  . Aicd placed 03/2008    Dr. Graciela Husbands  . Right corotid enderectomy 12/2010    Dr. Edilia Bo    Outpatient Encounter Prescriptions as of 03/21/2011  Medication Sig Dispense Refill  . albuterol (PROAIR HFA) 108 (90 BASE) MCG/ACT inhaler Inhale 2 puffs into the lungs every 4 (four) hours as needed for wheezing.  1 Inhaler  3  . allopurinol (ZYLOPRIM) 100 MG tablet Take 100 mg by mouth daily.        Marland Kitchen aspirin 81 MG tablet Take 81 mg by mouth daily.        . carvedilol (COREG) 3.125 MG tablet Take 3.125 mg by mouth 2 (two) times daily with a meal.    ==> med on HOLD     . fenofibrate 160 MG tablet Take 1 tablet (160 mg total) by mouth daily.  30 tablet  11  . guaiFENesin (MUCINEX) 600 MG 12 hr tablet Take 1,200 mg by mouth 2 (two) times daily. With plenty of water.       . Inulin (FIBERCHOICE PO) Take 1 tablet by mouth daily.        Marland Kitchen lisinopril (PRINIVIL,ZESTRIL) 5 MG tablet Take 5  mg by mouth 2 (two) times daily.    ==> med on HOLD     . pantoprazole (PROTONIX) 40 MG tablet Take 1  tablet by mouth 30 minutes before the first meal of the day  ==> states [name] never took it.    . simvastatin (ZOCOR) 40 MG tablet Take 40 mg by mouth at bedtime.          No Known Allergies   Review of Systems         See HPI - all other systems neg except as noted... The patient complains of dyspnea on exertion, prolonged cough, and difficulty walking.  The patient denies anorexia, fever, weight loss, weight gain, vision loss, decreased hearing, hoarseness, chest pain, syncope, peripheral edema, headaches, hemoptysis, abdominal pain, melena, hematochezia, severe indigestion/heartburn, hematuria, incontinence, muscle weakness, suspicious skin lesions, transient blindness, depression, unusual weight change, abnormal bleeding, enlarged lymph nodes, and angioedema.     Objective:   Physical Exam      WD, WN, 77 y/o WM > chr ill appearing but NAD... GENERAL:  Alert & oriented; pleasant & cooperative... HEENT:  [place]/AT, EOM-full, PERRLA, EACs-clear  NOSE-clear, THROAT-clear & wnl, Voice sounds back to norm NECK:  Supple w/ fairROM; no JVD; prominent carotid impulses, scar on right, + bruits; no thyromegaly or nodules palpated; no lymphadenopathy... CHEST:  bilat rhonchi at bases & end-exp wheezing,  no rales, no signs of consolidation... HEART:  Regular Rhythm; gr 1/6 SEM, S4, no rubs... ABDOMEN:  Min Bruise in right flank, mild tender lower ribs; normal bowel sounds; no organomegaly or masses detected. EXT: without deformities, mild arthritic changes; no varicose veins/ +venous insuffic/ no edema. NEURO: no focal neuro deficits... DERM:  dry skin dermatitis, seborrhea, rosacea...   Assessment & Plan:   FALL 4/12 w/ signif trauma> trauma to right chest wall/ flank/ etc... Improved w/ rest, heat, rib binder, pain meds, IS, Proair, Mucinex, & finished the Pred... More mobile & feeling better; [name] is off the prev Vicodin etc.  COPD>  On above meds, [name] has restarted smoking his cigars...  Hx HBP  & Postural Hypotension>  [name] is off the Lisinopril & Coreg for 1wk now & still has signif postural BP changes w/ 30pt sys drop; we discussed monitoring BP at home, not exacerbating the prob w/ etoh, plenty of fluids & nutrition...  ASHD/ Cardiomyop/ AICD>  Followed by DHochrein & Graciela Husbands...  GI/ Dysphagia>  [name] notes symptoms resolved spont & [name] does not believe that [name] has a problem in this area, declines PPI Rx or GI eval...  Other medical issues as noted.Marland KitchenMarland Kitchen

## 2011-03-21 NOTE — Patient Instructions (Signed)
Today we updated your meds in EPIC...    Leave the Lisinopril & Coreg OFF for now...  Monitor your BP at home & it would be helpful to check it supine & sitting or standing     Be careful changing positions & standing up> watch for postural hypotension...  [name], you need to minimize (ie- stop) the smoking & the alcohol (their effects on your system are all deleterious).  I have arranged for you to see DrHochrein tomorrow 03/22/11 at 11:30 AM at the [facility] office...  Let's plan a follow up here in 2 months, sooner if needed for problems.Marland KitchenMarland Kitchen

## 2011-03-22 ENCOUNTER — Ambulatory Visit (INDEPENDENT_AMBULATORY_CARE_PROVIDER_SITE_OTHER): Payer: Medicare Other | Admitting: Cardiology

## 2011-03-22 ENCOUNTER — Encounter: Payer: Self-pay | Admitting: Cardiology

## 2011-03-22 DIAGNOSIS — I2589 Other forms of chronic ischemic heart disease: Secondary | ICD-10-CM

## 2011-03-22 DIAGNOSIS — Z95810 Presence of automatic (implantable) cardiac defibrillator: Secondary | ICD-10-CM

## 2011-03-22 DIAGNOSIS — F17200 Nicotine dependence, unspecified, uncomplicated: Secondary | ICD-10-CM

## 2011-03-22 DIAGNOSIS — I6529 Occlusion and stenosis of unspecified carotid artery: Secondary | ICD-10-CM

## 2011-03-22 DIAGNOSIS — I2510 Atherosclerotic heart disease of native coronary artery without angina pectoris: Secondary | ICD-10-CM

## 2011-03-22 DIAGNOSIS — R55 Syncope and collapse: Secondary | ICD-10-CM

## 2011-03-22 DIAGNOSIS — I951 Orthostatic hypotension: Secondary | ICD-10-CM

## 2011-03-22 DIAGNOSIS — Z7289 Other problems related to lifestyle: Secondary | ICD-10-CM

## 2011-03-22 NOTE — Assessment & Plan Note (Signed)
The patient had previously signed a registered letter requesting that he schedule followup of his ICD. He remembers getting the latter but never called to schedule followup. He won't phone followup. He agrees to comply with office followup however. I reviewed this at length with the patient and his daughter.

## 2011-03-22 NOTE — Assessment & Plan Note (Signed)
I will repeat an echocardiogram. However, I will be awaiting the medications typically used for the reasons outlined.

## 2011-03-22 NOTE — Assessment & Plan Note (Signed)
He understands that I would like him to quit smoking completely.

## 2011-03-22 NOTE — Patient Instructions (Signed)
You will be scheduled for a 2 D Echo to follow up your Cardiomyopathy. You are being referred to cardiac rehab at [facility] - they will contact you Follow up in 4 months with Dr Antoine Poche Please schedule to have your defib device checked with Dr Graciela Husbands

## 2011-03-22 NOTE — Progress Notes (Signed)
HPI The patient presents today for this appointment accompanied by his daughter. I asked for and received permission to talk openly in front of her. The patient has had problems with his blood pressure dropping and with falls and loss of consciousness. I reviewed her recent ER visit. He did have trauma with injury to his ribs. He was actually hypertensive in the emergency room. At his home these had documented blood pressures by his report with systolics in the 70s. He has stopped taking his beta blocker and ACE inhibitor. I do note that his alcohol level in the emergency room was 27 and this was felt by the ER physicians to contribute to his problem. He does have some orthostatic symptoms but denies any palpitations. He denies any shortness of breath, chest or arm discomfort. He denies any weight gain or edema. He does drink alcohol and continues to smoke cigars.  No Known Allergies  Current Outpatient Prescriptions  Medication Sig Dispense Refill  . albuterol (PROAIR HFA) 108 (90 BASE) MCG/ACT inhaler Inhale 2 puffs into the lungs every 4 (four) hours as needed for wheezing.  1 Inhaler  3  . allopurinol (ZYLOPRIM) 100 MG tablet Take 100 mg by mouth daily.        Marland Kitchen aspirin 81 MG tablet Take 81 mg by mouth daily.        . fenofibrate 160 MG tablet Take 1 tablet (160 mg total) by mouth daily.  30 tablet  11  . Inulin (FIBERCHOICE PO) Take 1 tablet by mouth daily.        . simvastatin (ZOCOR) 40 MG tablet Take 40 mg by mouth at bedtime.        Marland Kitchen DISCONTD: carvedilol (COREG) 3.125 MG tablet Take 3.125 mg by mouth 2 (two) times daily with a meal. <<HOLD>>      . DISCONTD: guaiFENesin (MUCINEX) 600 MG 12 hr tablet Take 1,200 mg by mouth 2 (two) times daily. With plenty of water.       Marland Kitchen DISCONTD: lisinopril (PRINIVIL,ZESTRIL) 5 MG tablet Take 5 mg by mouth 2 (two) times daily. <<HOLD>>        Past Medical History  Diagnosis Date  . Allergic rhinitis   . COPD (chronic obstructive pulmonary disease)     . Tobacco abuse   . Hypertension   . Atherosclerotic heart disease   . Ischemic cardiomyopathy   . Syncope   . Presence of cardiac defibrillator   . Peripheral vascular disease   . Hypercholesterolemia   . Diverticulosis of colon   . History of colonic polyps   . Hemorrhoids   . Abnormal liver function tests   . DJD (degenerative joint disease)   . History of gout   . Back pain   . Compression fracture   . Osteoporosis   . Dermatitis     Past Surgical History  Procedure Date  . Abdominal aortic aneurysm repair 2002    by Dr. Hart [place]  . Cad stent 02/2002    Dr. Antoine Poche  . Aicd placed 03/2008    Dr. Graciela Husbands  . Right corotid enderectomy 12/2010    Dr. Edilia Bo    ROS: Back pain.  Otherwise as stated in the HPI and negative for all other systems.  PHYSICAL EXAM BP 121/75  Pulse 94  Resp 18  Ht 5\' 11"  (1.803 m)  Wt 159 lb 12.8 oz (72.485 kg)  BMI 22.29 kg/m2 GENERAL:  Gaunt appearing HEENT:  Pupils equal round and reactive, fundi not visualized, oral  mucosa unremarkable NECK:  No jugular venous distention, waveform within normal limits, carotid upstroke brisk and symmetric, no bruits, no thyromegaly LYMPHATICS:  No cervical, inguinal adenopathy LUNGS:  Clear to auscultation bilaterally BACK:  No CVA tenderness CHEST:  ICD pocket intact HEART:  PMI not displaced or sustained,S1 and S2 within normal limits, no S3, no S4, no clicks, no rubs, no murmurs ABD:  Flat, positive bowel sounds normal in frequency in pitch, no bruits, no rebound, no guarding, no midline pulsatile mass, no hepatomegaly, no splenomegaly EXT:  2 plus pulses throughout, no edema, no cyanosis no clubbing SKIN:  No rashes no nodules NEURO:  Cranial nerves II through XII grossly intact, motor grossly intact throughout PSYCH:  Cognitively intact, oriented to person place and time; EKG:  ASSESSMENT AND PLAN

## 2011-03-22 NOTE — Assessment & Plan Note (Addendum)
I discussed the risk of falls and syncope and myself changes to try to avoid drops in blood pressure. I will also wear the compression stockings.

## 2011-03-22 NOTE — Assessment & Plan Note (Signed)
He had stable coronary disease on catheter in 2009. He has no new chest pain. I will continue to emphasize risk reduction.

## 2011-03-22 NOTE — Assessment & Plan Note (Signed)
He will have a followup Doppler in 6 months.

## 2011-03-22 NOTE — Assessment & Plan Note (Signed)
Again with his permission I discussed this with him in front of his daughter today. I reported the alcohol level recorded in the emergency room and she was quite surprised by this. I discussed how this clearly must contribute to some of his unsteadiness, falls and can be related to hypotension and orthostasis. I have suggested complete abstinence.

## 2011-03-22 NOTE — Assessment & Plan Note (Signed)
I had a long discussion with the patient and his daughter about this. This is certainly multifactorial. He needs a prescription for knee-high compression stockings. I will avoid ACE inhibitor and beta blocker as he is orthostatic and these are contributing. We discussed at length the benefits of these medications in patients with coronary disease and cardiomyopathy but the risk in a patient with orthostasis and ongoing alcohol abuse.

## 2011-03-23 ENCOUNTER — Ambulatory Visit: Payer: Medicare Other | Admitting: Gastroenterology

## 2011-03-28 ENCOUNTER — Encounter: Payer: Self-pay | Admitting: Internal Medicine

## 2011-04-03 ENCOUNTER — Telehealth: Payer: Self-pay | Admitting: Cardiology

## 2011-04-03 NOTE — Telephone Encounter (Signed)
Is the signature sign by Dr. Antoine Poche or is it a stamp. Please verify.

## 2011-04-03 NOTE — Telephone Encounter (Signed)
The first one was signed by MD - the second one that said the same thing was stamped.  The original was faxed to Cardiac Rehab and sent to MR for scanning.  [name] to call cardiac rehab and let them know if they need another one signed, they need to re-fax it.

## 2011-04-18 ENCOUNTER — Other Ambulatory Visit: Payer: Self-pay | Admitting: *Deleted

## 2011-04-18 MED ORDER — ALLOPURINOL 100 MG PO TABS: 100.0000 mg | ORAL_TABLET | Freq: Every day | ORAL | Status: DC

## 2011-04-23 ENCOUNTER — Ambulatory Visit: Payer: Medicare Other | Admitting: Cardiology

## 2011-05-01 ENCOUNTER — Ambulatory Visit (HOSPITAL_COMMUNITY): Payer: Medicare Other | Attending: Cardiology | Admitting: Radiology

## 2011-05-01 ENCOUNTER — Ambulatory Visit (INDEPENDENT_AMBULATORY_CARE_PROVIDER_SITE_OTHER): Payer: Medicare Other | Admitting: Internal Medicine

## 2011-05-01 ENCOUNTER — Encounter: Payer: Self-pay | Admitting: Internal Medicine

## 2011-05-01 VITALS — BP 150/92 | HR 77 | Ht 71.0 in | Wt 156.0 lb

## 2011-05-01 DIAGNOSIS — R55 Syncope and collapse: Secondary | ICD-10-CM

## 2011-05-01 DIAGNOSIS — F17200 Nicotine dependence, unspecified, uncomplicated: Secondary | ICD-10-CM | POA: Insufficient documentation

## 2011-05-01 DIAGNOSIS — I079 Rheumatic tricuspid valve disease, unspecified: Secondary | ICD-10-CM | POA: Insufficient documentation

## 2011-05-01 DIAGNOSIS — I2589 Other forms of chronic ischemic heart disease: Secondary | ICD-10-CM

## 2011-05-01 LAB — ICD DEVICE OBSERVATION
AL THRESHOLD: 0.5 V
BAMS-0001: 170 {beats}/min
BATTERY VOLTAGE: 3.0716 V
CHARGE TIME: 8.688 s
DEV-0020ICD: NEGATIVE
FVT: 0
PACEART VT: 0
RV LEAD AMPLITUDE: 4.75 mv
RV LEAD THRESHOLD: 0.75 V
TOT-0002: 0
TZAT-0002FASTVT: NEGATIVE
TZAT-0002SLOWVT: NEGATIVE
TZAT-0018FASTVT: NEGATIVE
TZAT-0019FASTVT: 8 V
TZAT-0020FASTVT: 1.5 ms
TZAT-0020SLOWVT: 1.5 ms
TZON-0004VSLOWVT: 28
TZST-0001FASTVT: 2
TZST-0001FASTVT: 3
TZST-0001FASTVT: 6
TZST-0001SLOWVT: 3
TZST-0001SLOWVT: 5
TZST-0002FASTVT: NEGATIVE
TZST-0002FASTVT: NEGATIVE
TZST-0002FASTVT: NEGATIVE
TZST-0002SLOWVT: NEGATIVE
TZST-0002SLOWVT: NEGATIVE
VENTRICULAR PACING ICD: 0 pct

## 2011-05-01 NOTE — Patient Instructions (Signed)
Your physician has recommended you make the following change in your medication:  1) restart lisinopril 5 mg 1/2 tablet by mouth at bedtime.  Your physician wants you to follow-up in: 1 year. You will receive a reminder letter in the mail two months in advance. If you don't receive a letter, please call our office to schedule the follow-up appointment.

## 2011-05-01 NOTE — Progress Notes (Signed)
  HPI  [name] is a 77 y.o. male seen in followup for ischemic heart disease modest depression of LV function with some interval improvement with ejection fraction of 40-45% by echo in April last year he status post ICD implantation for primary prevention. There have been no intercurrent discharges.  He has had one episode of syncope a few months ago. Dr. Antoine Poche discontinued his Coreg and lisinopril at that time.  The patient denies chest pain, shortness of breath, nocturnal dyspnea, orthopnea or peripheral edema.  There have been no palpitations, lightheadedness or syncope.     Past Medical History  Diagnosis Date  . Allergic rhinitis   . COPD (chronic obstructive pulmonary disease)   . Tobacco abuse   . Hypertension   . Atherosclerotic heart disease   . Ischemic cardiomyopathy   . Syncope   . Presence of cardiac defibrillator   . Peripheral vascular disease   . Hypercholesterolemia   . Diverticulosis of colon   . History of colonic polyps   . Hemorrhoids   . Abnormal liver function tests   . DJD (degenerative joint disease)   . History of gout   . Back pain   . Compression fracture   . Osteoporosis   . Dermatitis     Past Surgical History  Procedure Date  . Abdominal aortic aneurysm repair 2002    by Dr. Hart Rochester  . Cad stent 02/2002    Dr. Antoine Poche  . Aicd placed 03/2008    Dr. Graciela Husbands  . Right corotid enderectomy 12/2010    Dr. Edilia Bo    Current Outpatient Prescriptions  Medication Sig Dispense Refill  . albuterol (PROAIR HFA) 108 (90 BASE) MCG/ACT inhaler Inhale 2 puffs into the lungs every 4 (four) hours as needed for wheezing.  1 Inhaler  3  . allopurinol (ZYLOPRIM) 100 MG tablet Take 1 tablet (100 mg total) by mouth daily.  30 tablet  5  . aspirin 81 MG tablet Take 81 mg by mouth daily.        . fenofibrate 160 MG tablet Take 1 tablet (160 mg total) by mouth daily.  30 tablet  11  . Inulin (FIBERCHOICE PO) Take 1 tablet by mouth daily.        .  simvastatin (ZOCOR) 40 MG tablet Take 40 mg by mouth at bedtime.          No Known Allergies  Review of Systems negative except from HPI and PMH  Physical Exam Well developed and well nourished in no acute distress HENT normal E scleral and icterus clear Neck Supple JVP flat; carotids brisk and full Clear to ausculation Regular rate and rhythm, no murmurs gallops or rub Soft with active bowel sounds No clubbing cyanosis and edema Alert and oriented, grossly normal motor and sensory function Skin Warm and Dry  Assessment and  Plan

## 2011-05-01 NOTE — Assessment & Plan Note (Signed)
We will try and get him back on his ACE inhibitor and his low dose beta blocker

## 2011-05-01 NOTE — Assessment & Plan Note (Signed)
The patient's device was interrogated.  The information was reviewed. No changes were made in the programming.    

## 2011-05-01 NOTE — Assessment & Plan Note (Signed)
This is felt to be related to hypotension. His blood pressure today is somewhat elevated. We will begin him back on lisinopril at 2.5 mg taken at night

## 2011-05-22 ENCOUNTER — Encounter: Payer: Self-pay | Admitting: Pulmonary Disease

## 2011-05-22 ENCOUNTER — Ambulatory Visit (INDEPENDENT_AMBULATORY_CARE_PROVIDER_SITE_OTHER): Payer: Medicare Other | Admitting: Pulmonary Disease

## 2011-05-22 DIAGNOSIS — E7800 Pure hypercholesterolemia, unspecified: Secondary | ICD-10-CM

## 2011-05-22 DIAGNOSIS — I6529 Occlusion and stenosis of unspecified carotid artery: Secondary | ICD-10-CM

## 2011-05-22 DIAGNOSIS — R55 Syncope and collapse: Secondary | ICD-10-CM

## 2011-05-22 DIAGNOSIS — L309 Dermatitis, unspecified: Secondary | ICD-10-CM | POA: Insufficient documentation

## 2011-05-22 DIAGNOSIS — M810 Age-related osteoporosis without current pathological fracture: Secondary | ICD-10-CM

## 2011-05-22 DIAGNOSIS — L578 Other skin changes due to chronic exposure to nonionizing radiation: Secondary | ICD-10-CM | POA: Insufficient documentation

## 2011-05-22 DIAGNOSIS — M1990 Unspecified osteoarthritis, unspecified site: Secondary | ICD-10-CM

## 2011-05-22 DIAGNOSIS — J449 Chronic obstructive pulmonary disease, unspecified: Secondary | ICD-10-CM

## 2011-05-22 DIAGNOSIS — I2589 Other forms of chronic ischemic heart disease: Secondary | ICD-10-CM

## 2011-05-22 DIAGNOSIS — I739 Peripheral vascular disease, unspecified: Secondary | ICD-10-CM

## 2011-05-22 DIAGNOSIS — I10 Essential (primary) hypertension: Secondary | ICD-10-CM

## 2011-05-22 NOTE — Patient Instructions (Signed)
Today we updated your med list in EPIC...    Continue your current meds the same...  Continue your home exercise regimen, and BE CAREFUL...  Call for any questions...  Let's plan a follow up visit in 6 months w/ FASTING blood work at that time.Marland KitchenMarland Kitchen

## 2011-05-22 NOTE — Progress Notes (Signed)
Subjective:    Patient ID: [name], male    DOB: 06/13/1931, 77 y.o.   MRN: [id number]  HPI 77 y/o WM here for a follow up visit... he has been followed closely by DrHochrein & DrKlein during the past years due to his cardiomyopathy & AICD...   ~  October 10, 2010:  Yearly ROV- doing well, he says w/o new complaints or concerns... states he had recent URI but toughed it out & better now... he saw DrKlein 6/11- f/u ischemic heart dis w/ decr LVF & AICD> noted some postural changes, hx syncope, on-going smoking, AICD was OK & no changes made...  he saw DrHochrein 8/11- cardiomyopathy, EF 40-45% on last 2DEcho, syncopal epis related to postural BP changes & he decreased his meds (Coreg3.125Bid & Lisin5Bid) & no prob since then...  he had CDopplers Q58mo w/ 60-79% bilat ICAstenoses (mod to severe mixed irreg plaque) on ASA daily & no cerebral ischemic symptoms...  he's had some on-going LBP & XRay 8/11 w/o acute changes (prev T12 augmentation, NAD).Marland Kitchen. up to date on his vaccinations and doesn't need refill perscriptions today.  ~  February 16, 2011:  [name] had a 28mo f/u CDoppler 2/12 w/ worsening RICA velocities c/w 80-99% stenosis (stable 60-79% LICA stenosis);  He was evaluated by [facility] DrDickson & underwent a right CAE w/ DPA 3/12 without complications;  He has been doing satis post-op on his ASA 81mg /d & they plan f/u CDoppler in 28mo intervals going forward...      On 02/09/11 he was bringing groceries into the house when his legs gave way & he fell backwards striking his occiput & right side on the ground;  ER eval revealed signif contusions, ?right rib fxs, hematoma on occiput, etc;  Work up included CXR, Lumbar films, CT Brain & CSpine> see results below;  Not adm & Rx as outpt w/ rest, heat, Vicodin, etc...  Family called w/ concern for his pain level, not eating or drinking enough, incr wheezing & SOB> we called in Pred dosepak, Proair inhaler, Mucinex 2Bid w/ fluids;  They report much improved on this  rx> eating better, more mobile, still w/ pain, bruise right flank, tender, etc; and they note weak, weak voice, some reflux symptoms, hard to swallow Tricor capsule, etc...    We discussed finishing out the Pred, continue IS (for deep breathing), Proair, Mucinex, rib binder, heating pad, Protonix, GI eval for swallowing by DrPatterson, lax meds as needed...  ~  May 30,2012:  [name] did not take the Protonix & didn't see GI as he felt that his reflux improved on its own & swallowing is OK now; he also feels that his voice has improved back to baseline; he has resumed some smoking (cigars) & Etoh according to his daughter who accompanies him today;  He has remained ?dizzy w/ postural BP changes and "passing out" he says related to his BP meds> prev on Lisinopril 5mg Bid & Coreg 3.125mg Bid- he decr to just one of each Qam in mid-May after talking to DrHochrein, but still had the afternoon symptoms so he stopped the meds completely one week ago;  BP today shows prominent postural BP changes w/ BP 130/70 supine & 100/60 sitting & standing...    Review of the record shows he last saw DrHochrein 8/11 w/ similar symptoms- syncopal spell at home, believed due to transient low BP, Lisinopril & Coreg adjusted at that time for his Cardiomyopathy> last 2DEcho was 4/10 showing mild LVH, mod reduced LVF w/ EF=40-45%  w/ inferobasal & post HK, mild MR, paradoxical septal motion;  We reviewed meds, Labs/ XRays/ ER note from 4/12...  Asked to leave the Lisinopril & Coreg off for now, stop the smoking & Etoh, monitor BP when sitting/ standing, follow up w/ Cards ASAP...  ~  May 22, 2011:  41mo ROV & he saw DrHochrein 5/12 & DrKlein 7/12 for f/u of his Ischemic heart dis & depressed LVF w/ EF=40-45% on 2DEcho 4/11, w/ ICD implant for primary prevention (no discharges- devise doing well); he has been off his Coreg & Lisinopril due to Syncope felt related to postural hypotension (now using support hose) but DrKlein wanted him back on  these meds starting w/ low doses> he had f/u 2DEcho 7/12 showing mild LVH, EF~35-40%, Gr 1 DD, & rec to restart meds per DrKlein; pt declined Cardiac Rehab.    He tells me that he has quit Etoh & feeling better> BP 140/70, improved, no postural changes & prev dizziness resolved;  he is back on LISINOPRIL 5mg  Qhs;  We reviewed prev labs & reminded of need for f/u FLP...         Problem List:  COPD (ICD-496) - despite all efforts [name] continues to smoke 3-4 cigars per day... he has min cough, some phlegm, but denies CP, SOB, wheezing, etc...  He doesn't want Chantix or help w/ smoking cessation;  He has a PROAIR inhaler for Prn use but he seldom uses it, & he knows to use the OTC MUCINEX 1-2 Bid w/ fluids for congestion. ~  CXR 3/12 in hosp for CAE showed COPD/E, biapical pleuroparenchymal scarring, NAD, AICD on left, old left rib fx, old T12 vertebroplasty... ~  Fall at home 4/12 w/ signif trauma & prob right rib fxs (CXR in ER showed some atelec but NAD & no rib films done).  HYPERTENSION (ICD-401.9) - currently taking LISINOPRIL 5mg  Qhs... prev on Coreg 3.125mg Bid & Lisinopril 5mg Bid- he weaned off these meds 5/12 due to dizziness & postural syncope... ~  12/11:  BP= 126/70 and doing well> denies HA, fatigue, visual changes, CP, palipit, dizziness, dyspnea, edema, etc; he has had postural hypotension & several syncopal episodes related to this- now improved w/ the final adjustment in his meds. ~  4/12:  BP= 90/58 ==>100/60 recheck & he is weak; asked to monitor BP at home & may need to decr meds. ~  5/12:  BP= 130/70 supine & 100/60 sitting & standing (no symptoms at present)> he has been off the Lisinopril & Coreg for 1wk now. ~  7/12:  BP= 120/72 & 140/70 w/o postural changes; he is back on Lisinopril 5mg  Qhs per Cards.  ATHEROSCLEROTIC HEART DISEASE (ICD-414.00) - on ASA 81mg /d + above meds... ISCHEMIC CARDIOMYOPATHY (ICD-414.8) Hx of SYNCOPE (ICD-780.2) & IMPLANTABLE DEFIBRILLATOR, DDD MDT  (ICD-V45.02) ~  Cath in 2003 w/ 2 vessel CAD and stent placed in LAD...  ~  Cardiolite 1/08 w/ large inferolat infarct, no ischemia, EF=36%...  ~  2DEcho 2/08 w/ infer & post HK, EF=40%...  ~  recath 6/09 w/ heavily calcif vessels & 40% EF- DrHochrein has been following carefully and adjusting meds. ~  AICD placed 2009 by DrKlein for hx syncope & ischemic cardiomyopathy- followed by DrKlein yearly & doing satis. ~  2DEcho 4/10 showed mild LVH, mod reduced LVF w/ EF=40-45% w/ inferobasal & post HK, mild MR, paradoxical septal motion. ~  9/10: Cards tried to incr the Coreg to 9.375mg Bid but pt intol & went back to 6.25Bid. ~  8/11:  f/u by DrHochrein w/ recent syncopal episode & meds adjusted Coreg 3.125Bid & Lisinopril 5Bid. ~  Serial XRays have all revealed signif atherosclerotic changes diffusely (eg- lumbar films 4/12, CT neck 4/12 as well). ~  4/12> ER visit for fall at home w/ signif trauma> ?post BP related, ?med related, ?other etiology > Cards eval & weaned off Coreg/ Lisinopril w/ resolution of postural hypotension. ~  7/12:  DrKlein restarted Lisin 5mg Qhs, BP improved, no further postural changes, f/u 2DEcho w/ EF=35-40% & Gr1DD...  CEREBROVASCULAR DISEASE PERIPHERAL VASCULAR DISEASE (ICD-443.9) - on ASA 81mg /d...  ~  He is s/p AAA repair 2000 by [facility]... he is sedentary and hasn't had ABI's checked... I will leave this to DrHochrein... ~  CDopplers 7/10, 1/1,1 & 8/11 showed stable mod carotid dis bilat w/ heavy calcif plaque & 60-79% bilat ICA stenoses... ~  CDopplers 2/12 w/ worsening RICA velocities c/w 80-99% stenosis (stable 60-79% LICA stenosis);  He was evaluated by [facility] DrDickson & underwent a right CAE w/ DPA 3/12 without complications;  He has been doing satis post-op & they plan f/u CDoppler in 78mo intervals going forward... ~  NOTE:  CT Brain 4/12 in ER showed mild to mod cortical vol loss & cbll atrophy, sm vessel dis, no acute changes... Prom vasc calcif seen on neck films  & in abd...  HYPERCHOLESTEROLEMIA (ICD-272.0) - on SIMVASTATIN 40mg /d & FENOFIBRATE 160/d. ~  FLP 4/08 showed TChol 131, TG 100, HDL 41, LDL 70 ~  FLP 2/09 showed TChol 124, TG 132, HDL 37, LDL 61 ~  FLP 12/10 > pt never ret for FLP & insurance changed [facility] to [facility]. ~  FLP 12/11 showed TChol 112, TG 51, HDL 43, LDL 59 ~  4/12:  They report some difficult swallowing the Tricor capsule==> referred to GI for swallowing eval & switched to FENOFIBRATE 160mg /d.  GERD/ DYSPHAGIA> ~  4/12: pt noted some reflux symptoms & mild dysphagia for large capsule; Protonix 40mg /d started & refer to GI for eval; they also note weak voice & may need ENT eval if not resolved in follow up... ~  5/12:  Pt states all symptoms resolved on their own, didn't take PPI, didn't see GI, denies swallowing or voice issues now.  DIVERTICULOSIS OF COLON (ICD-562.10) COLONIC POLYPS (ICD-211.3) HEMORRHOIDS (ICD-455.6) - last colonoscopy was 11/06 by DrPatterson showing divertics, several 1-65mm polyps (hyperplastic), and hems...  Hx of LIVER FUNCTION TESTS, ABNORMAL (ICD-794.8) - improved off etoh... ~  labs 2/09 showed SGOT= 30, SGPT= 17 ~  labs 12/11 showed SGOT= 38, SGPT= 19  DEGENERATIVE JOINT DISEASE (ICD-715.90) Hx of GOUT (ICD-274.9) - on ALLOPURINOL 100mg /d... ~  labs 2/09 showed Uric= 3.3  OSTEOPOROSIS (ICD-733.00) - on Caltrate, MVI, Vit D... he had T12 compression after syncopal spell 2009 w/ vertebroplasty by DrDeveshwar...  BMD here 10/09 showed TScores +0.3 in Spine, & -2.7 in right FemNeck (Ortho eval by Janett Billow)... ~  labs 12/11 showed Vit D level = 22... rec to take Men's MVI + Vit D 2000 u daily.  DERMATITIS (ICD-692.9)  Health Maintenance -  ~  GI: colonoscopy 11/06 w/ several hyperplastic polyps removed... ~  GU: PSA 12/11 = 1.71 ~  Immunizations:  he tells me that he had PNEUMOVAX & 2010 Flu shot 10/10... received TETANUS shot here 2003...   Past Surgical History  Procedure Date  .  Abdominal aortic aneurysm repair 2002    by Dr. Hart [place]  . Cad stent 02/2002    Dr. Antoine Poche  .  Aicd placed 03/2008    Dr. Graciela Husbands  . Right corotid enderectomy 12/2010    Dr. Edilia Bo    Outpatient Encounter Prescriptions as of 05/22/2011  Medication Sig Dispense Refill  . albuterol (PROAIR HFA) 108 (90 BASE) MCG/ACT inhaler Inhale 2 puffs into the lungs every 4 (four) hours as needed for wheezing.  1 Inhaler  3  . allopurinol (ZYLOPRIM) 100 MG tablet Take 1 tablet (100 mg total) by mouth daily.  30 tablet  5  . aspirin 81 MG tablet Take 81 mg by mouth daily.        . fenofibrate 160 MG tablet Take 1 tablet (160 mg total) by mouth daily.  30 tablet  11  . Inulin (FIBERCHOICE PO) Take 2 tablets by mouth daily.       Marland Kitchen lisinopril (PRINIVIL,ZESTRIL) 5 MG tablet Take 5 mg by mouth at bedtime.       . simvastatin (ZOCOR) 40 MG tablet Take 40 mg by mouth at bedtime.          No Known Allergies   Review of Systems         See HPI - all other systems neg except as noted... The patient complains of dyspnea on exertion, prolonged cough, and difficulty walking.  The patient denies anorexia, fever, weight loss, weight gain, vision loss, decreased hearing, hoarseness, chest pain, syncope, peripheral edema, headaches, hemoptysis, abdominal pain, melena, hematochezia, severe indigestion/heartburn, hematuria, incontinence, muscle weakness, suspicious skin lesions, transient blindness, depression, unusual weight change, abnormal bleeding, enlarged lymph nodes, and angioedema.     Objective:   Physical Exam      WD, WN, 77 y/o WM > chr ill appearing but NAD... GENERAL:  Alert & oriented; pleasant & cooperative... HEENT:  [place]/AT, EOM-full, PERRLA, EACs-clear  NOSE-clear, THROAT-clear & wnl, Voice sounds back to norm NECK:  Supple w/ fairROM; no JVD; prominent carotid impulses, scar on right, + bruits; no thyromegaly or nodules palpated; no lymphadenopathy... CHEST:  bilat rhonchi at bases & end-exp  wheezing,  no rales, no signs of consolidation... HEART:  Regular Rhythm; gr 1/6 SEM, S4, no rubs... ABDOMEN:  Min Bruise in right flank, mild tender lower ribs; normal bowel sounds; no organomegaly or masses detected. EXT: without deformities, mild arthritic changes; no varicose veins/ +venous insuffic/ no edema. NEURO: no focal neuro deficits... DERM:  dry skin dermatitis, seborrhea, rosacea...   Assessment & Plan:   COPD>  On above meds, he has restarted smoking his cigars & encouraged to quit completely!  He declines Chantix etc...  Hx HBP & Postural Hypotension>  BP normal now & tolerating Lisinopril 5mg  Qhs per DrKlein; no postural BP changes noted today & his dizziness has resolved; he notes all symptoms improved off etoh...  ASHD/ Cardiomyop/ AICD>  Followed by DHochrein & Graciela Husbands...  GI/ Dysphagia>  He notes symptoms resolved spont & he does not believe that he has a problem in this area, declines PPI Rx or GI eval...  FALL 4/12 w/ signif trauma>  Hx of trauma to right chest wall/ flank/ etc... Improved w/ rest, heat, rib binder, pain meds, IS, Proair, Mucinex, & Pred... More mobile & feeling better; He is off the prev Vicodin etc.  Other medical issues as noted.Marland KitchenMarland Kitchen

## 2011-06-03 ENCOUNTER — Encounter: Payer: Self-pay | Admitting: Pulmonary Disease

## 2011-07-04 ENCOUNTER — Encounter: Payer: Self-pay | Admitting: Cardiology

## 2011-07-04 ENCOUNTER — Ambulatory Visit (INDEPENDENT_AMBULATORY_CARE_PROVIDER_SITE_OTHER): Payer: Medicare Other | Admitting: Cardiology

## 2011-07-04 VITALS — BP 126/74 | HR 83 | Resp 18 | Ht 71.0 in | Wt 159.0 lb

## 2011-07-04 DIAGNOSIS — Z95810 Presence of automatic (implantable) cardiac defibrillator: Secondary | ICD-10-CM

## 2011-07-04 DIAGNOSIS — I2510 Atherosclerotic heart disease of native coronary artery without angina pectoris: Secondary | ICD-10-CM

## 2011-07-04 DIAGNOSIS — I2589 Other forms of chronic ischemic heart disease: Secondary | ICD-10-CM

## 2011-07-04 DIAGNOSIS — I5022 Chronic systolic (congestive) heart failure: Secondary | ICD-10-CM

## 2011-07-04 DIAGNOSIS — I6529 Occlusion and stenosis of unspecified carotid artery: Secondary | ICD-10-CM

## 2011-07-04 DIAGNOSIS — F17200 Nicotine dependence, unspecified, uncomplicated: Secondary | ICD-10-CM

## 2011-07-04 MED ORDER — CARVEDILOL 3.125 MG PO TABS: 3.1250 mg | ORAL_TABLET | Freq: Two times a day (BID) | ORAL | Status: DC

## 2011-07-04 NOTE — Progress Notes (Signed)
HPI The patient presents today for follow up of his cardiomyopathy.  At the last appt with me I had a long discussion about the risk of alcohol.  He reports that since then he stopped drinking.  He was seen by Dr. Graciela Husbands and started back on 2.5 mg of lisinopril.  He took it upon himself to increase this to 5 mg when he did well with the lower dose.  Since I last saw her she has done well.  The patient denies any new symptoms such as chest discomfort, neck or arm discomfort. There has been no new shortness of breath, PND or orthopnea. There have been no reported palpitations, presyncope or syncope.  He has had no problems with balance and falls and feels much better off of EtOH.  No Known Allergies  Current Outpatient Prescriptions  Medication Sig Dispense Refill  . albuterol (PROAIR HFA) 108 (90 BASE) MCG/ACT inhaler Inhale 2 puffs into the lungs every 4 (four) hours as needed for wheezing.  1 Inhaler  3  . allopurinol (ZYLOPRIM) 100 MG tablet Take 1 tablet (100 mg total) by mouth daily.  30 tablet  5  . aspirin 81 MG tablet Take 81 mg by mouth daily.        . cetirizine (ZYRTEC) 10 MG tablet Take 10 mg by mouth daily.        . fenofibrate 160 MG tablet Take 1 tablet (160 mg total) by mouth daily.  30 tablet  11  . Inulin (FIBERCHOICE PO) Take 2 tablets by mouth daily.       Marland Kitchen lisinopril (PRINIVIL,ZESTRIL) 5 MG tablet Take 5 mg by mouth at bedtime.       . simvastatin (ZOCOR) 40 MG tablet Take 40 mg by mouth at bedtime.          Past Medical History  Diagnosis Date  . Allergic rhinitis   . COPD (chronic obstructive pulmonary disease)   . Tobacco abuse   . Hypertension   . Atherosclerotic heart disease   . Ischemic cardiomyopathy   . Syncope   . Presence of cardiac defibrillator   . Peripheral vascular disease   . Hypercholesterolemia   . Diverticulosis of colon   . History of colonic polyps   . Hemorrhoids   . Abnormal liver function tests   . DJD (degenerative joint disease)   .  History of gout   . Back pain   . Compression fracture   . Osteoporosis   . Dermatitis     Past Surgical History  Procedure Date  . Abdominal aortic aneurysm repair 2002    by Dr. Hart [place]  . Cad stent 02/2002    Dr. Antoine Poche  . Aicd placed 03/2008    Dr. Graciela Husbands  . Right corotid enderectomy 12/2010    Dr. Edilia Bo    ROS: Back pain.  Otherwise as stated in the HPI and negative for all other systems.  PHYSICAL EXAM BP 126/74  Pulse 83  Resp 18  Ht 5\' 11"  (1.803 m)  Wt 159 lb (72.122 kg)  BMI 22.18 kg/m2 GENERAL:  Gaunt appearing HEENT:  Pupils equal round and reactive, fundi not visualized, oral mucosa unremarkable NECK:  No jugular venous distention, waveform within normal limits, carotid upstroke brisk and symmetric, no bruits, no thyromegaly LYMPHATICS:  No cervical, inguinal adenopathy LUNGS:  Clear to auscultation bilaterally BACK:  No CVA tenderness CHEST:  ICD pocket intact HEART:  PMI not displaced or sustained,S1 and S2 within normal limits, no S3, no  S4, no clicks, no rubs, no murmurs ABD:  Flat, positive bowel sounds normal in frequency in pitch, no bruits, no rebound, no guarding, no midline pulsatile mass, no hepatomegaly, no splenomegaly EXT:  2 plus pulses throughout, no edema, no cyanosis no clubbing SKIN:  No rashes no nodules NEURO:  Cranial nerves II through XII grossly intact, motor grossly intact throughout PSYCH:  Cognitively intact, oriented to person place and time;  EKG:  Sinus rhythm, RBBB, no change from previous  ASSESSMENT AND PLAN

## 2011-07-04 NOTE — Assessment & Plan Note (Signed)
The patient has no new sypmtoms.  No further cardiovascular testing is indicated.  We will continue with aggressive risk reduction and meds as listed.  

## 2011-07-04 NOTE — Assessment & Plan Note (Signed)
He was compliant with his last followup. We have discussed compliance with this in the past and hopefully he will improve long term.

## 2011-07-04 NOTE — Assessment & Plan Note (Signed)
Today I will try to creep up on his meds by adding back carvedilol 3.125 mg daily. He may increase this to b.i.d. If he has no symptoms particularly lightheadedness or presyncope. He will continue other meds as listed.

## 2011-07-04 NOTE — Assessment & Plan Note (Signed)
We have talked about this in the past. He is not ready to quit smoking cigars.

## 2011-07-04 NOTE — Assessment & Plan Note (Signed)
We reviewed this today. This is followed by vascular surgery.

## 2011-07-04 NOTE — Patient Instructions (Signed)
Please start Carvedilol 3.125 mg once a day and increase to twice a day if tolerated.  Continue all other medications as listed.  Follow up with Dr Antoine Poche in 2 months

## 2011-07-19 LAB — BASIC METABOLIC PANEL
BUN: 11
Chloride: 100
Glucose, Bld: 79
Potassium: 4.1

## 2011-07-19 LAB — CBC
HCT: 31.5 — ABNORMAL LOW
Hemoglobin: 10.9 — ABNORMAL LOW
MCV: 102.2 — ABNORMAL HIGH
Platelets: 171
WBC: 5.8

## 2011-07-19 LAB — POCT I-STAT 3, VENOUS BLOOD GAS (G3P V)
Bicarbonate: 24.1 — ABNORMAL HIGH
TCO2: 25
pCO2, Ven: 40.1 — ABNORMAL LOW
pH, Ven: 7.386 — ABNORMAL HIGH

## 2011-07-19 LAB — PROTIME-INR: INR: 1.1

## 2011-07-19 LAB — POCT I-STAT 3, ART BLOOD GAS (G3+)
pCO2 arterial: 34.5 — ABNORMAL LOW
pH, Arterial: 7.439

## 2011-07-19 LAB — APTT: aPTT: 32

## 2011-07-20 ENCOUNTER — Other Ambulatory Visit (INDEPENDENT_AMBULATORY_CARE_PROVIDER_SITE_OTHER): Payer: Medicare Other | Admitting: *Deleted

## 2011-07-20 DIAGNOSIS — I6529 Occlusion and stenosis of unspecified carotid artery: Secondary | ICD-10-CM

## 2011-07-20 DIAGNOSIS — Z48812 Encounter for surgical aftercare following surgery on the circulatory system: Secondary | ICD-10-CM

## 2011-07-26 ENCOUNTER — Encounter: Payer: Self-pay | Admitting: Vascular Surgery

## 2011-07-26 NOTE — Procedures (Unsigned)
CAROTID DUPLEX EXAM  INDICATION:  Follow up right CEA, 12/2010.  HISTORY: Diabetes:  No. Cardiac:  Yes. Hypertension:  Yes. Smoking:  Yes. Previous Surgery:  Right CEA. CV History: Amaurosis Fugax No, Paresthesias No, Hemiparesis No.                                      RIGHT             LEFT Brachial systolic pressure:         110               103 Brachial Doppler waveforms:         WNL               WNL Vertebral direction of flow:        Antegrade         Antegrade DUPLEX VELOCITIES (cm/sec) CCA peak systolic                   101               82 ECA peak systolic                   106               77 ICA peak systolic                   32                167 ICA end diastolic                   10                58 PLAQUE MORPHOLOGY:                                    Heterogenous PLAQUE AMOUNT:                      NA                Mild PLAQUE LOCATION:                                      ICA/ECA  IMPRESSION: 1. Widely patent right carotid endarterectomy without evidence of     restenosis or hyperplasia. 2. 40% to 59% left internal carotid artery stenosis. 3. Bilateral vertebral arteries are within normal limits.  ___________________________________________ Di Kindle. Edilia Bo, M.D.  LT/MEDQ  D:  07/20/2011  T:  07/20/2011  Job:  [id number]

## 2011-07-31 ENCOUNTER — Other Ambulatory Visit: Payer: Self-pay | Admitting: Vascular Surgery

## 2011-07-31 DIAGNOSIS — I6529 Occlusion and stenosis of unspecified carotid artery: Secondary | ICD-10-CM

## 2011-07-31 DIAGNOSIS — Z48812 Encounter for surgical aftercare following surgery on the circulatory system: Secondary | ICD-10-CM

## 2011-08-09 ENCOUNTER — Ambulatory Visit (INDEPENDENT_AMBULATORY_CARE_PROVIDER_SITE_OTHER): Payer: Medicare Other | Admitting: *Deleted

## 2011-08-09 ENCOUNTER — Encounter: Payer: Self-pay | Admitting: Internal Medicine

## 2011-08-09 ENCOUNTER — Other Ambulatory Visit: Payer: Self-pay | Admitting: Internal Medicine

## 2011-08-09 DIAGNOSIS — I428 Other cardiomyopathies: Secondary | ICD-10-CM

## 2011-08-09 DIAGNOSIS — Z95810 Presence of automatic (implantable) cardiac defibrillator: Secondary | ICD-10-CM

## 2011-08-12 LAB — REMOTE ICD DEVICE
AL AMPLITUDE: 2.8 mv
ATRIAL PACING ICD: 4.42 pct
CHARGE TIME: 8.688 s
PACEART VT: 0
RV LEAD AMPLITUDE: 4 mv
RV LEAD IMPEDENCE ICD: 532 Ohm
TOT-0001: 2
TOT-0002: 0
TZAT-0001SLOWVT: 1
TZAT-0002FASTVT: NEGATIVE
TZAT-0002SLOWVT: NEGATIVE
TZAT-0012FASTVT: 200 ms
TZAT-0020FASTVT: 1.5 ms
TZON-0004VSLOWVT: 28
TZON-0005SLOWVT: 12
TZST-0001FASTVT: 3
TZST-0001FASTVT: 4
TZST-0001FASTVT: 5
TZST-0001SLOWVT: 3
TZST-0001SLOWVT: 4
TZST-0001SLOWVT: 5
TZST-0002FASTVT: NEGATIVE
TZST-0002FASTVT: NEGATIVE
TZST-0002FASTVT: NEGATIVE
TZST-0002SLOWVT: NEGATIVE
TZST-0002SLOWVT: NEGATIVE
TZST-0002SLOWVT: NEGATIVE
TZST-0002SLOWVT: NEGATIVE

## 2011-08-21 ENCOUNTER — Encounter: Payer: Self-pay | Admitting: *Deleted

## 2011-08-21 NOTE — Progress Notes (Signed)
icd check in clinic  

## 2011-09-03 ENCOUNTER — Encounter: Payer: Self-pay | Admitting: Cardiology

## 2011-09-03 ENCOUNTER — Ambulatory Visit (INDEPENDENT_AMBULATORY_CARE_PROVIDER_SITE_OTHER): Payer: Medicare Other | Admitting: Cardiology

## 2011-09-03 DIAGNOSIS — I6529 Occlusion and stenosis of unspecified carotid artery: Secondary | ICD-10-CM

## 2011-09-03 DIAGNOSIS — R55 Syncope and collapse: Secondary | ICD-10-CM

## 2011-09-03 DIAGNOSIS — I2510 Atherosclerotic heart disease of native coronary artery without angina pectoris: Secondary | ICD-10-CM

## 2011-09-03 DIAGNOSIS — Z7289 Other problems related to lifestyle: Secondary | ICD-10-CM

## 2011-09-03 DIAGNOSIS — F17200 Nicotine dependence, unspecified, uncomplicated: Secondary | ICD-10-CM

## 2011-09-03 NOTE — Assessment & Plan Note (Signed)
He will continue with risk reduction. 

## 2011-09-03 NOTE — Assessment & Plan Note (Signed)
We have discussed the need to stop cigars. I don't think he can do this at this point

## 2011-09-03 NOTE — Progress Notes (Signed)
HPI The patient presents today for follow up of his cardiomyopathy.  At the last appt I restarted a low dose of Coreg.  Had been having problems with low blood pressure and syncope. However, since he has been avoiding alcohol he says he's not had this problem. He's had no palpitations, presyncope or syncope. He's had no chest discomfort, neck or arm discomfort. He's had no new shortness of breath, PND or orthopnea.   No Known Allergies  Current Outpatient Prescriptions  Medication Sig Dispense Refill  . albuterol (PROAIR HFA) 108 (90 BASE) MCG/ACT inhaler Inhale 2 puffs into the lungs every 4 (four) hours as needed for wheezing.  1 Inhaler  3  . allopurinol (ZYLOPRIM) 100 MG tablet Take 1 tablet (100 mg total) by mouth daily.  30 tablet  5  . aspirin 81 MG tablet Take 81 mg by mouth daily.        . carvedilol (COREG) 3.125 MG tablet Take 1 tablet (3.125 mg total) by mouth 2 (two) times daily.  60 tablet  11  . cetirizine (ZYRTEC) 10 MG tablet Take 10 mg by mouth daily.        . fenofibrate 160 MG tablet Take 1 tablet (160 mg total) by mouth daily.  30 tablet  11  . Inulin (FIBERCHOICE PO) Take 2 tablets by mouth daily.       Marland Kitchen lisinopril (PRINIVIL,ZESTRIL) 5 MG tablet Take 5 mg by mouth at bedtime.       . Multiple Vitamins-Minerals (OCUVITE PO) Take by mouth. 1 tab twice a day       . simvastatin (ZOCOR) 40 MG tablet Take 40 mg by mouth at bedtime.          Past Medical History  Diagnosis Date  . Allergic rhinitis   . COPD (chronic obstructive pulmonary disease)   . Tobacco abuse   . Hypertension   . Atherosclerotic heart disease   . Ischemic cardiomyopathy   . Syncope   . Presence of cardiac defibrillator   . Peripheral vascular disease   . Hypercholesterolemia   . Diverticulosis of colon   . History of colonic polyps   . Hemorrhoids   . Abnormal liver function tests   . DJD (degenerative joint disease)   . History of gout   . Back pain   . Compression fracture   .  Osteoporosis   . Dermatitis     Past Surgical History  Procedure Date  . Abdominal aortic aneurysm repair 2002    by Dr. Hart [place]  . Cad stent 02/2002    Dr. Antoine Poche  . Aicd placed 03/2008    Dr. Graciela Husbands  . Right corotid enderectomy 12/2010    Dr. Edilia Bo    ROS: Left foot pain.  Otherwise as stated in the HPI and negative for all other systems.  PHYSICAL EXAM BP 111/60  Pulse 68  Ht 5\' 11"  (1.803 m)  Wt 160 lb (72.576 kg)  BMI 22.32 kg/m2 GENERAL:  Gaunt appearing, no distress HEENT: Pupil reactive, fundi not visualized, oral mucosa unremarkable NECK:  No jugular venous distention, waveform within normal limits, carotid upstroke brisk and symmetric, no bruits, no thyromegaly LYMPHATICS:  No cervical, inguinal adenopathy LUNGS:  Clear to auscultation bilaterally BACK:  No CVA tenderness CHEST:  ICD pocket intact HEART:  PMI not displaced or sustained,S1 and S2 within normal limits, no S3, no S4, no clicks, no rubs, no murmurs ABD:  Flat, positive bowel sounds normal in frequency in pitch, no bruits,  no rebound, no guarding, no midline pulsatile mass, no hepatomegaly, no splenomegaly EXT:  2 plus pulses throughout, no edema, no cyanosis no clubbing SKIN:  No rashes no nodules NEURO:  Cranial nerves II through XII grossly intact, motor grossly intact throughout PSYCH:  Cognitively intact, oriented to person place and time;  ASSESSMENT AND PLAN

## 2011-09-03 NOTE — Assessment & Plan Note (Signed)
He has had no further problems with this. However, his low blood pressure does not allow me to titrate his meds at this point. He will continue the meds as listed.

## 2011-09-03 NOTE — Assessment & Plan Note (Signed)
This is followed by vascular surgery

## 2011-09-03 NOTE — Patient Instructions (Addendum)
Follow up in 4 months with Dr Hochrein  The current medical regimen is effective;  continue present plan and medications.  

## 2011-09-03 NOTE — Assessment & Plan Note (Signed)
He says he is still abstaining. I encourage complete abstinence

## 2011-10-10 ENCOUNTER — Ambulatory Visit (INDEPENDENT_AMBULATORY_CARE_PROVIDER_SITE_OTHER): Payer: Medicare Other | Admitting: Pulmonary Disease

## 2011-10-10 ENCOUNTER — Other Ambulatory Visit (INDEPENDENT_AMBULATORY_CARE_PROVIDER_SITE_OTHER): Payer: Medicare Other

## 2011-10-10 ENCOUNTER — Encounter: Payer: Self-pay | Admitting: Pulmonary Disease

## 2011-10-10 ENCOUNTER — Other Ambulatory Visit: Payer: Self-pay | Admitting: Allergy

## 2011-10-10 DIAGNOSIS — E7800 Pure hypercholesterolemia, unspecified: Secondary | ICD-10-CM

## 2011-10-10 DIAGNOSIS — J449 Chronic obstructive pulmonary disease, unspecified: Secondary | ICD-10-CM

## 2011-10-10 DIAGNOSIS — T148XXA Other injury of unspecified body region, initial encounter: Secondary | ICD-10-CM

## 2011-10-10 DIAGNOSIS — J4489 Other specified chronic obstructive pulmonary disease: Secondary | ICD-10-CM

## 2011-10-10 DIAGNOSIS — R1319 Other dysphagia: Secondary | ICD-10-CM

## 2011-10-10 DIAGNOSIS — I6529 Occlusion and stenosis of unspecified carotid artery: Secondary | ICD-10-CM

## 2011-10-10 DIAGNOSIS — D126 Benign neoplasm of colon, unspecified: Secondary | ICD-10-CM

## 2011-10-10 DIAGNOSIS — I2510 Atherosclerotic heart disease of native coronary artery without angina pectoris: Secondary | ICD-10-CM

## 2011-10-10 DIAGNOSIS — M810 Age-related osteoporosis without current pathological fracture: Secondary | ICD-10-CM

## 2011-10-10 DIAGNOSIS — N139 Obstructive and reflux uropathy, unspecified: Secondary | ICD-10-CM

## 2011-10-10 DIAGNOSIS — M1990 Unspecified osteoarthritis, unspecified site: Secondary | ICD-10-CM

## 2011-10-10 DIAGNOSIS — I2589 Other forms of chronic ischemic heart disease: Secondary | ICD-10-CM

## 2011-10-10 DIAGNOSIS — I739 Peripheral vascular disease, unspecified: Secondary | ICD-10-CM

## 2011-10-10 DIAGNOSIS — I10 Essential (primary) hypertension: Secondary | ICD-10-CM

## 2011-10-10 DIAGNOSIS — K5730 Diverticulosis of large intestine without perforation or abscess without bleeding: Secondary | ICD-10-CM

## 2011-10-10 DIAGNOSIS — L259 Unspecified contact dermatitis, unspecified cause: Secondary | ICD-10-CM

## 2011-10-10 DIAGNOSIS — I5022 Chronic systolic (congestive) heart failure: Secondary | ICD-10-CM

## 2011-10-10 LAB — PSA: PSA: 1.5 ng/mL (ref 0.10–4.00)

## 2011-10-10 LAB — BASIC METABOLIC PANEL
BUN: 14 mg/dL (ref 6–23)
CO2: 27 mEq/L (ref 19–32)
Calcium: 9.9 mg/dL (ref 8.4–10.5)
GFR: 74.12 mL/min (ref >60.00)
Glucose, Bld: 87 mg/dL (ref 70–99)
Sodium: 137 mEq/L (ref 135–145)

## 2011-10-10 LAB — CBC WITH DIFFERENTIAL/PLATELET
Basophils Absolute: 0 10*3/uL (ref 0.0–0.1)
Eosinophils Relative: 22.2 % — ABNORMAL HIGH (ref 0.0–5.0)
Hemoglobin: 14 g/dL (ref 13.0–17.0)
Lymphocytes Relative: 25.2 % (ref 12.0–46.0)
Monocytes Relative: 6.3 % (ref 3.0–12.0)
Platelets: 121 10*3/uL — ABNORMAL LOW (ref 150.0–400.0)
RDW: 13.9 % (ref 11.5–14.6)
WBC: 8.4 10*3/uL (ref 4.5–10.5)

## 2011-10-10 LAB — LIPID PANEL
Cholesterol: 124 mg/dL (ref 0–200)
HDL: 36.9 mg/dL — ABNORMAL LOW (ref >39.00)
VLDL: 15.6 mg/dL (ref 0.0–40.0)

## 2011-10-10 LAB — HEPATIC FUNCTION PANEL
AST: 31 U/L (ref 0–37)
Albumin: 4.3 g/dL (ref 3.5–5.2)
Alkaline Phosphatase: 41 U/L (ref 39–117)
Total Protein: 7.2 g/dL (ref 6.0–8.3)

## 2011-10-10 LAB — TSH: TSH: 1.59 u[IU]/mL (ref 0.35–5.50)

## 2011-10-10 MED ORDER — LISINOPRIL 5 MG PO TABS: 5.0000 mg | ORAL_TABLET | Freq: Every day | ORAL | Status: DC

## 2011-10-10 MED ORDER — CARVEDILOL 3.125 MG PO TABS: 3.1250 mg | ORAL_TABLET | Freq: Two times a day (BID) | ORAL | Status: DC

## 2011-10-10 NOTE — Progress Notes (Signed)
Subjective:    Patient ID: [name], male    DOB: 05-Oct-1931, 77 y.o.   MRN: [id number]  HPI 77 y/o WM here for a follow up visit... he has been followed closely by DrHochrein & DrKlein during the past years due to his cardiomyopathy & AICD...   ~  October 10, 2010:  Yearly ROV- doing well, he says w/o new complaints or concerns... states he had recent URI but toughed it out & better now... he saw DrKlein 6/11- f/u ischemic heart dis w/ decr LVF & AICD> noted some postural changes, hx syncope, on-going smoking, AICD was OK & no changes made...  he saw DrHochrein 8/11- cardiomyopathy, EF 40-45% on last 2DEcho, syncopal epis related to postural BP changes & he decreased his meds (Coreg3.125Bid & Lisin5Bid) & no prob since then...  he had CDopplers Q37mo w/ 60-79% bilat ICAstenoses (mod to severe mixed irreg plaque) on ASA daily & no cerebral ischemic symptoms...  he's had some on-going LBP & XRay 8/11 w/o acute changes (prev T12 augmentation, NAD).Marland Kitchen. up to date on his vaccinations and doesn't need refill perscriptions today.  ~  February 16, 2011:  [name] had a 37mo f/u CDoppler 2/12 w/ worsening RICA velocities c/w 80-99% stenosis (stable 60-79% LICA stenosis);  He was evaluated by [facility] DrDickson & underwent a right CAE w/ DPA 3/12 without complications;  He has been doing satis post-op on his ASA 81mg /d & they plan f/u CDoppler in 37mo intervals going forward...      On 02/09/11 he was bringing groceries into the house when his legs gave way & he fell backwards striking his occiput & right side on the ground;  ER eval revealed signif contusions, ?right rib fxs, hematoma on occiput, etc;  Work up included CXR, Lumbar films, CT Brain & CSpine> see results below;  Not adm & Rx as outpt w/ rest, heat, Vicodin, etc...  Family called w/ concern for his pain level, not eating or drinking enough, incr wheezing & SOB> we called in Pred dosepak, Proair inhaler, Mucinex 2Bid w/ fluids;  They report much improved on this  rx> eating better, more mobile, still w/ pain, bruise right flank, tender, etc; and they note weak, weak voice, some reflux symptoms, hard to swallow Tricor capsule, etc...    We discussed finishing out the Pred, continue IS (for deep breathing), Proair, Mucinex, rib binder, heating pad, Protonix, GI eval for swallowing by DrPatterson, lax meds as needed...  ~  May 30,2012:  [name] did not take the Protonix & didn't see GI as he felt that his reflux improved on its own & swallowing is OK now; he also feels that his voice has improved back to baseline; he has resumed some smoking (cigars) & Etoh according to his daughter who accompanies him today;  He has remained ?dizzy w/ postural BP changes and "passing out" he says related to his BP meds> prev on Lisinopril 5mg Bid & Coreg 3.125mg Bid- he decr to just one of each Qam in mid-May after talking to DrHochrein, but still had the afternoon symptoms so he stopped the meds completely one week ago;  BP today shows prominent postural BP changes w/ BP 130/70 supine & 100/60 sitting & standing...    Review of the record shows he last saw DrHochrein 8/11 w/ similar symptoms- syncopal spell at home, believed due to transient low BP, Lisinopril & Coreg adjusted at that time for his Cardiomyopathy> last 2DEcho was 4/10 showing mild LVH, mod reduced LVF w/ EF=40-45%  w/ inferobasal & post HK, mild MR, paradoxical septal motion;  We reviewed meds, Labs/ XRays/ ER note from 4/12...  Asked to leave the Lisinopril & Coreg off for now, stop the smoking & Etoh, monitor BP when sitting/ standing, follow up w/ Cards ASAP...  ~  May 22, 2011:  48mo ROV & he saw DrHochrein 5/12 & DrKlein 7/12 for f/u of his Ischemic heart dis & depressed LVF w/ EF=40-45% on 2DEcho 4/11, w/ ICD implant for primary prevention (no discharges- devise doing well); he has been off his Coreg & Lisinopril due to Syncope felt related to postural hypotension (now using support hose) but DrKlein wanted him back on  these meds starting w/ low doses> he had f/u 2DEcho 7/12 showing mild LVH, EF~35-40%, Gr 1 DD, & rec to restart meds per DrKlein; pt declined Cardiac Rehab.    He tells me that he has quit Etoh & feeling better> BP 140/70, improved, no postural changes & prev dizziness resolved;  he is back on LISINOPRIL 5mg  Qhs;  We reviewed prev labs & reminded of need for f/u FLP...  ~  October 10, 2011:  67mo ROV & he is improved overall (he credits quitting Etoh), notes some itching w/ eval by Derm (given cream for itching), otherw no new complaints or concerns... COPD> he has Proair for prn use; doing better w/ cut back on cigar consumption... HBP> BP= 128/74 today on Coreg3.125Bid & Lisinopril5; denies CP, palpit, dizzy/syncope, ch in SOB/DOE, edema... ASHD/ Cardiomyopathy> improved w/ BBlocker/ ACE rx, not requiring diuretic; he saw DrHochrein 11/12> doing better, no changes made. Periph Vasc Dis> AAA repair 2000 by [facility], still too sedentary & he declined cardiac rehab; s/p R CAE w/ DPA 3/12 by DrCDickson; f/u CDoppler by [facility] 9/12 showed patent rightCAE site & 40-59% left ICA stenosis; they plan regular follow ups... CHOL> on Simva40 + fenofib160 & FLP looks good today (see below)... GI> GERD, Divertics, Polyps,etc>  GI reported stable on incr Fiber intake... DJD, osteoporosis, compression fx>  Vit D level is low at 17 & rec to take Calcium, Men's formula MVI, Vit D OTC 5000u daily... DERM> he saw Derm re: his rash (?seb dermatitis) and itching; labs show 22% eos & rec to take Antihist eg Buel Ream, Zyrkek daily...         Problem List:  COPD (ICD-496) - despite all efforts Khali continues to smoke 3-4 cigars per day... he has min cough, some phlegm, but denies CP, SOB, wheezing, etc...  He doesn't want Chantix or help w/ smoking cessation;  He has a PROAIR inhaler for Prn use but he seldom uses it, & he knows to use the OTC MUCINEX 1-2 Bid w/ fluids for congestion. ~  CXR 3/12 in hosp for CAE  showed COPD/E, biapical pleuroparenchymal scarring, NAD, AICD on left, old left rib fx, old T12 vertebroplasty... ~  Fall at home 4/12 w/ signif trauma & prob right rib fxs (CXR in ER showed some atelec but NAD & no rib films done).  HYPERTENSION (ICD-401.9) - back on COREG 3.125Bid & LISINOPRIL 5mg Qhs... Prev had to wean off these due to dizziness & post hypotension (resolved off Etoh). ~  12/11:  BP= 126/70 and doing well> denies HA, fatigue, visual changes, CP, palipit, dizziness, dyspnea, edema, etc; he has had postural hypotension & several syncopal episodes related to this- now improved w/ the final adjustment in his meds. ~  4/12:  BP= 90/58 ==>100/60 recheck & he is weak; asked to monitor  BP at home & may need to decr meds. ~  5/12:  BP= 130/70 supine & 100/60 sitting & standing (no symptoms at present)> he has been off the Lisinopril & Coreg for 1wk now. ~  7/12:  BP= 120/72 & 140/70 w/o postural changes (improved off etoh); he is back on Lisinopril 5mg  Qhs per Cards. ~  12/12:  BP= 128/74 today on Coreg3.125Bid & Lisinopril5; denies CP, palpit, dizzy/syncope, ch in SOB/DOE, edema...  ATHEROSCLEROTIC HEART DISEASE (ICD-414.00) - on ASA 81mg /d + above meds... ISCHEMIC CARDIOMYOPATHY (ICD-414.8) Hx of SYNCOPE (ICD-780.2) & IMPLANTABLE DEFIBRILLATOR, DDD MDT (ICD-V45.02) ~  Cath in 2003 w/ 2 vessel CAD and stent placed in LAD...  ~  Cardiolite 1/08 w/ large inferolat infarct, no ischemia, EF=36%...  ~  2DEcho 2/08 w/ infer & post HK, EF=40%...  ~  recath 6/09 w/ heavily calcif vessels & 40% EF- DrHochrein has been following carefully and adjusting meds. ~  AICD placed 2009 by DrKlein for hx syncope & ischemic cardiomyopathy- followed by DrKlein yearly & doing satis. ~  2DEcho 4/10 showed mild LVH, mod reduced LVF w/ EF=40-45% w/ inferobasal & post HK, mild MR, paradoxical septal motion. ~  9/10: Cards tried to incr the Coreg to 9.375mg Bid but pt intol & went back to 6.25Bid. ~  8/11:  f/u  by DrHochrein w/ recent syncopal episode & meds adjusted Coreg 3.125Bid & Lisinopril 5Bid. ~  Serial XRays have all revealed signif atherosclerotic changes diffusely (eg- lumbar films 4/12, CT neck 4/12 as well). ~  4/12> ER visit for fall at home w/ signif trauma> ?post BP related, ?med related, ?other etiology > Cards eval & weaned off Coreg/ Lisinopril w/ resolution of postural hypotension. ~  7/12:  DrKlein restarted Lisin 5mg Qhs, BP improved, no further postural changes, f/u 2DEcho w/ EF=35-40% & Gr1DD... ~  12/12:  DrHochrein has her back on Coreg3.125Bid & Lisinopril5/d & stable overall...  CEREBROVASCULAR DISEASE PERIPHERAL VASCULAR DISEASE (ICD-443.9) - on ASA 81mg /d...  ~  He is s/p AAA repair 2000 by [facility]... he is sedentary and hasn't had ABI's checked... I will leave this to DrHochrein... ~  CDopplers 7/10, 1/1,1 & 8/11 showed stable mod carotid dis bilat w/ heavy calcif plaque & 60-79% bilat ICA stenoses... ~  CDopplers 2/12 w/ worsening RICA velocities c/w 80-99% stenosis (stable 60-79% LICA stenosis);  He was evaluated by [facility] DrDickson & underwent a right CAE w/ DPA 3/12 without complications;  He has been doing satis post-op & they plan f/u CDoppler in 13mo intervals going forward... ~  NOTE:  CT Brain 4/12 in ER showed mild to mod cortical vol loss & cbll atrophy, sm vessel dis, no acute changes... Prom vasc calcif seen on neck films & in abd...  HYPERCHOLESTEROLEMIA (ICD-272.0) - on SIMVASTATIN 40mg /d & FENOFIBRATE 160/d. ~  FLP 4/08 showed TChol 131, TG 100, HDL 41, LDL 70 ~  FLP 2/09 showed TChol 124, TG 132, HDL 37, LDL 61 ~  FLP 12/10 > pt never ret for FLP & insurance changed [employer] to [facility]. ~  FLP 12/11 showed TChol 112, TG 51, HDL 43, LDL 59 ~  4/12:  They report some difficult swallowing the Tricor capsule==> referred to GI for swallowing eval & switched to FENOFIBRATE 160mg /d. ~  FLP 12/12 on Simva40+Feno160 showed TChol 124, TG 78, HDL 37, LDL 72  GERD/  DYSPHAGIA> ~  4/12: pt noted some reflux symptoms & mild dysphagia for large capsule; Protonix 40mg /d started & refer to GI for eval;  they also note weak voice & may need ENT eval if not resolved in follow up... ~  5/12:  Pt states all symptoms resolved on their own, didn't take PPI, didn't see GI, denies swallowing or voice issues now.  DIVERTICULOSIS OF COLON (ICD-562.10) > he takes fiber supplement daily. COLONIC POLYPS (ICD-211.3) HEMORRHOIDS (ICD-455.6) - last colonoscopy was 11/06 by DrPatterson showing divertics, several 1-24mm polyps (hyperplastic), and hems...  Hx of LIVER FUNCTION TESTS, ABNORMAL (ICD-794.8) - improved off etoh... ~  labs 2/09 showed SGOT= 30, SGPT= 17 ~  labs 12/11 showed SGOT= 38, SGPT= 19 ~  Labs 12/12 off all etoh & LFTs all wnl...  DEGENERATIVE JOINT DISEASE (ICD-715.90) Hx of GOUT (ICD-274.9) - on ALLOPURINOL 100mg /d... ~  labs 2/09 showed Uric= 3.3  OSTEOPOROSIS (ICD-733.00) - supposed to be on Caltrate, MVI, Vit D... he had T12 compression after syncopal spell 2009 w/ vertebroplasty by DrDeveshwar...  BMD here 10/09 showed TScores +0.3 in Spine, & -2.7 in right FemNeck (Ortho eval by Janett Billow)... ~  labs 12/11 showed Vit D level = 22... rec to take Men's MVI + Vit D 2000 u daily. ~  Labs 12/12 showed Vit D level = 17; REC> start regular dosing of Calcium, Men's MVI, Vit D 5000u daily...  DERMATITIS (ICD-692.9) - he has eosinophilia & saw Derm w/ rx for topical cream;  rec to take antihist as well...  Health Maintenance -  ~  GI: colonoscopy 11/06 w/ several hyperplastic polyps removed... ~  GU: PSA 12/12 = 1.50 ~  Immunizations:  he tells me that he had PNEUMOVAX & 2010 Flu shot 10/10... received TETANUS shot here 2003...   Past Surgical History  Procedure Date  . Abdominal aortic aneurysm repair 2002    by Dr. Hart [place]  . Cad stent 02/2002    Dr. Antoine Poche  . Aicd placed 03/2008    Dr. Graciela Husbands  . Right corotid enderectomy 12/2010    Dr. Edilia Bo     Outpatient Encounter Prescriptions as of 10/10/2011  Medication Sig Dispense Refill  . albuterol (PROAIR HFA) 108 (90 BASE) MCG/ACT inhaler Inhale 2 puffs into the lungs every 4 (four) hours as needed for wheezing.  1 Inhaler  3  . allopurinol (ZYLOPRIM) 100 MG tablet Take 1 tablet (100 mg total) by mouth daily.  30 tablet  5  . aspirin 81 MG tablet Take 81 mg by mouth daily.        . carvedilol (COREG) 3.125 MG tablet Take 1 tablet (3.125 mg total) by mouth 2 (two) times daily.  60 tablet  11  . cetirizine (ZYRTEC) 10 MG tablet Take 10 mg by mouth daily.        . fenofibrate 160 MG tablet Take 1 tablet (160 mg total) by mouth daily.  30 tablet  11  . Inulin (FIBERCHOICE PO) Take 2 tablets by mouth daily.       Marland Kitchen lisinopril (PRINIVIL,ZESTRIL) 5 MG tablet Take 5 mg by mouth at bedtime.       . Multiple Vitamins-Minerals (OCUVITE PO) 1 tab twice a day      . simvastatin (ZOCOR) 40 MG tablet Take 40 mg by mouth at bedtime.          No Known Allergies   Current Medications, Allergies, Past Medical History, Past Surgical History, Family History, and Social History were reviewed in [employer] record.    Review of Systems         See HPI - all other systems  neg except as noted... The patient complains of dyspnea on exertion, prolonged cough, and difficulty walking.  The patient denies anorexia, fever, weight loss, weight gain, vision loss, decreased hearing, hoarseness, chest pain, syncope, peripheral edema, headaches, hemoptysis, abdominal pain, melena, hematochezia, severe indigestion/heartburn, hematuria, incontinence, muscle weakness, suspicious skin lesions, transient blindness, depression, unusual weight change, abnormal bleeding, enlarged lymph nodes, and angioedema.     Objective:   Physical Exam      WD, WN, 77 y/o WM > chr ill appearing but NAD... GENERAL:  Alert & oriented; pleasant & cooperative... HEENT:  [name]/AT, EOM-full, PERRLA, EACs-clear   NOSE-clear, THROAT-clear & wnl, Voice sounds back to norm NECK:  Supple w/ fairROM; no JVD; prominent carotid impulses, scar on right, + bruits; no thyromegaly or nodules palpated; no lymphadenopathy... CHEST:  bilat rhonchi at bases & end-exp wheezing,  no rales, no signs of consolidation... HEART:  Regular Rhythm; gr 1/6 SEM, S4, no rubs... ABDOMEN:  Min Bruise in right flank, mild tender lower ribs; normal bowel sounds; no organomegaly or masses detected. EXT: without deformities, mild arthritic changes; no varicose veins/ +venous insuffic/ no edema. NEURO: no focal neuro deficits... DERM:  dry skin dermatitis, seborrhea, rosacea...  RADIOLOGY DATA:  Reviewed in the EPIC EMR & discussed w/ the patient...  LABORATORY DATA:  Reviewed in the EPIC EMR & discussed w/ the patient...   Assessment & Plan:   COPD>  Stable, on prn Proair only, he has restarted smoking his cigars & encouraged to quit completely!  He declines Chantix etc...  Hx HBP & Postural Hypotension>  BP normal now & tolerating Coreg 3.125Bid + Lisinopril 5mg  Qhs; no postural BP changes noted today & his dizziness has resolved; he notes all symptoms improved off etoh...  ASHD/ Cardiomyop/ AICD>  Followed by DHochrein & Graciela Husbands;  Continue current meds & f/u by Cards.  Periph Vasc Dis>  Followed by [facility], DrDickson & CDoppler 9/12 showed patent right CAE site w/ 40-59% left ICA stenosis & they are following...  GI/ Dysphagia>  He notes symptoms resolved spont & he does not believe that he has a problem in this area, declines PPI Rx or GI eval...  FALL 4/12 w/ signif trauma>  Hx of trauma to right chest wall/ flank/ etc... Improved w/ rest, heat, rib binder, pain meds, IS, Proair, Mucinex, & Pred... More mobile & feeling better; He is off the prev Vicodin etc;  Note compression fx, osteopenia, low vit d level==> he needs as a minimum Calcium/ Men's MVI/ Vit D 5000u daily...  Other medical issues as noted.Marland KitchenMarland Kitchen

## 2011-10-10 NOTE — Patient Instructions (Signed)
Today we updated your med list in our EPIC system...    Continue your current medications the same...    We refilled your Lisinopril & Coreg today...  Today we did your follow up fasting blood work...    Please call the PHONE TREE in a few days for your results...    Dial [id number] & when prompted enter your patient number followed by the # symbol...    Your patient number is:  [id number]  Call for any problems...  Let's plan a follow up visit in about 6 months.Marland KitchenMarland Kitchen

## 2011-10-12 ENCOUNTER — Encounter: Payer: Self-pay | Admitting: Pulmonary Disease

## 2011-11-08 ENCOUNTER — Encounter: Payer: Self-pay | Admitting: Internal Medicine

## 2011-11-08 ENCOUNTER — Ambulatory Visit (INDEPENDENT_AMBULATORY_CARE_PROVIDER_SITE_OTHER): Payer: Medicare Other | Admitting: *Deleted

## 2011-11-08 DIAGNOSIS — Z95810 Presence of automatic (implantable) cardiac defibrillator: Secondary | ICD-10-CM

## 2011-11-08 DIAGNOSIS — I428 Other cardiomyopathies: Secondary | ICD-10-CM

## 2011-11-11 LAB — REMOTE ICD DEVICE
AL AMPLITUDE: 2.6 mv
AL IMPEDENCE ICD: 475 Ohm
BAMS-0001: 170 {beats}/min
BATTERY VOLTAGE: 3.058 V
CHARGE TIME: 8.678 s
RV LEAD AMPLITUDE: 4.4 mv
RV LEAD IMPEDENCE ICD: 494 Ohm
TOT-0002: 0
TOT-0006: 20090623000000
TZAT-0001FASTVT: 1
TZAT-0001SLOWVT: 1
TZAT-0012FASTVT: 200 ms
TZAT-0012SLOWVT: 200 ms
TZAT-0018FASTVT: NEGATIVE
TZAT-0019FASTVT: 8 V
TZAT-0020SLOWVT: 1.5 ms
TZON-0003SLOWVT: 360 ms
TZST-0001FASTVT: 2
TZST-0001FASTVT: 4
TZST-0001FASTVT: 6
TZST-0001SLOWVT: 4
TZST-0001SLOWVT: 5
TZST-0002FASTVT: NEGATIVE
TZST-0002FASTVT: NEGATIVE
TZST-0002SLOWVT: NEGATIVE
TZST-0002SLOWVT: NEGATIVE
VENTRICULAR PACING ICD: 0 pct
VF: 0

## 2011-11-13 ENCOUNTER — Encounter: Payer: Self-pay | Admitting: *Deleted

## 2011-11-19 NOTE — Progress Notes (Signed)
Remote icd check  

## 2011-11-22 ENCOUNTER — Telehealth: Payer: Self-pay | Admitting: Allergy

## 2011-11-22 MED ORDER — ALLOPURINOL 100 MG PO TABS: 100.0000 mg | ORAL_TABLET | Freq: Every day | ORAL | Status: DC

## 2011-11-22 NOTE — Telephone Encounter (Signed)
Pharmacy requested refill on  Allopurinol 100 mg  #30 X5

## 2011-12-05 ENCOUNTER — Other Ambulatory Visit: Payer: Self-pay | Admitting: *Deleted

## 2011-12-05 MED ORDER — SIMVASTATIN 40 MG PO TABS: 40.0000 mg | ORAL_TABLET | Freq: Every day | ORAL | Status: DC

## 2012-01-23 ENCOUNTER — Ambulatory Visit: Payer: Medicare Other | Admitting: Vascular Surgery

## 2012-01-23 ENCOUNTER — Other Ambulatory Visit: Payer: Medicare Other

## 2012-02-05 ENCOUNTER — Encounter: Payer: Self-pay | Admitting: Neurosurgery

## 2012-02-06 ENCOUNTER — Other Ambulatory Visit (INDEPENDENT_AMBULATORY_CARE_PROVIDER_SITE_OTHER): Payer: Medicare Other | Admitting: *Deleted

## 2012-02-06 ENCOUNTER — Ambulatory Visit (INDEPENDENT_AMBULATORY_CARE_PROVIDER_SITE_OTHER): Payer: Medicare Other | Admitting: Neurosurgery

## 2012-02-06 ENCOUNTER — Encounter: Payer: Self-pay | Admitting: Neurosurgery

## 2012-02-06 VITALS — BP 123/72 | HR 62 | Resp 16 | Ht 71.0 in | Wt 167.3 lb

## 2012-02-06 DIAGNOSIS — Z48812 Encounter for surgical aftercare following surgery on the circulatory system: Secondary | ICD-10-CM

## 2012-02-06 DIAGNOSIS — I6529 Occlusion and stenosis of unspecified carotid artery: Secondary | ICD-10-CM

## 2012-02-06 NOTE — Progress Notes (Signed)
[facility] [place] HISTORY AND PHYSICAL   CC: 78 year old patient of Dr. Edilia Bo followed for known carotid stenosis and history of right CEA in March of 2012. Referring Physician: Edilia Bo  History of Present Illness: 78 year old patient without complaint that is one year status post right CEA. Patient denies signs or symptoms of CVA, TIA, dysphasia, diplopia, amaurosis fugax or  word finding difficulty. Patient reports no new medical problems or recent surgeries.  Past Medical History  Diagnosis Date  . Allergic rhinitis   . COPD (chronic obstructive pulmonary disease)   . Tobacco abuse   . Hypertension   . Atherosclerotic heart disease   . Ischemic cardiomyopathy   . Syncope   . Presence of cardiac defibrillator   . Peripheral vascular disease   . Hypercholesterolemia   . Diverticulosis of colon   . History of colonic polyps   . Hemorrhoids   . Abnormal liver function tests   . DJD (degenerative joint disease)   . History of gout   . Back pain   . Compression fracture   . Osteoporosis   . Dermatitis     ROS: [x]  Positive   [ ]  Denies    General: [ ]  Weight loss, [ ]  Fever, [ ]  chills Neurologic: [ ]  Dizziness, [ ]  Blackouts, [ ]  Seizure [ ]  Stroke, [ ]  "Mini stroke", [ ]  Slurred speech, [ ]  Temporary blindness; [ ]  weakness in arms or legs, [ ]  Hoarseness Cardiac: [ ]  Chest pain/pressure, [ ]  Shortness of breath at rest [ ]  Shortness of breath with exertion, [ ]  Atrial fibrillation or irregular heartbeat Vascular: [ ]  Pain in legs with walking, [ ]  Pain in legs at rest, [ ]  Pain in legs at night,  [ ]  Non-healing ulcer, [ ]  Blood clot in vein/DVT,   Pulmonary: [ ]  Home oxygen, [ ]  Productive cough, [ ]  Coughing up blood, [ ]  Asthma,  [ ]  Wheezing Musculoskeletal:  [ ]  Arthritis, [ ]  Low back pain, [ ]  Joint pain Hematologic: [ ]  Easy Bruising, [ ]  Anemia; [ ]  Hepatitis Gastrointestinal: [ ]  Blood in stool, [ ]  Gastroesophageal Reflux/heartburn, [ ]   Trouble swallowing Urinary: [ ]  chronic Kidney disease, [ ]  on HD - [ ]  MWF or [ ]  TTHS, [ ]  Burning with urination, [ ]  Difficulty urinating Skin: [ ]  Rashes, [ ]  Wounds Psychological: [ ]  Anxiety, [ ]  Depression   Social History History  Substance Use Topics  . Smoking status: Current Everyday Smoker    Types: Cigars  . Smokeless tobacco: Never Used  . Alcohol Use: 1.8 oz/week    3 Glasses of wine per week    Family History Family History  Problem Relation Age of Onset  . Hypertension Father   . Heart disease Father     Heart Disease before age 54  . Hypertension Mother   . Heart disease Mother     Heart Disease before age 31    No Known Allergies  Current Outpatient Prescriptions  Medication Sig Dispense Refill  . albuterol (PROAIR HFA) 108 (90 BASE) MCG/ACT inhaler Inhale 2 puffs into the lungs every 4 (four) hours as needed for wheezing.  1 Inhaler  3  . allopurinol (ZYLOPRIM) 100 MG tablet Take 1 tablet (100 mg total) by mouth daily.  30 tablet  5  . aspirin 81 MG tablet Take 81 mg by mouth daily.        . carvedilol (COREG) 3.125 MG  tablet Take 1 tablet (3.125 mg total) by mouth 2 (two) times daily.  60 tablet  11  . cetirizine (ZYRTEC) 10 MG tablet Take 10 mg by mouth daily.        . fenofibrate 160 MG tablet Take 1 tablet (160 mg total) by mouth daily.  30 tablet  11  . Inulin (FIBERCHOICE PO) Take 2 tablets by mouth daily.       Marland Kitchen lisinopril (PRINIVIL,ZESTRIL) 5 MG tablet Take 1 tablet (5 mg total) by mouth at bedtime.  30 tablet  11  . Multiple Vitamins-Minerals (OCUVITE PO) 1 tab twice a day      . simvastatin (ZOCOR) 40 MG tablet Take 1 tablet (40 mg total) by mouth at bedtime.  90 tablet  3    Physical Examination  Filed Vitals:   02/06/12 1103  BP: 123/72  Pulse: 62  Resp: 16    Body mass index is 23.33 kg/(m^2).  General:  WDWN in NAD Gait: Normal HEENT: WNL Eyes: Pupils equal Pulmonary: normal non-labored breathing , without Rales, rhonchi,   wheezing Cardiac: RRR, without  Murmurs, rubs or gallops; Abdomen: soft, NT, no masses Skin: no rashes, ulcers noted  Vascular Exam Pulses: She has 2+ radial pulses bilaterally Carotid bruits are not heard bilaterally, 2+ carotid pulses to auscultation Extremities without ischemic changes, no Gangrene , no cellulitis; no open wounds;  Musculoskeletal: no muscle wasting or atrophy   Neurologic: A&O X 3; Appropriate Affect ; SENSATION: normal; MOTOR FUNCTION:  moving all extremities equally. Speech is fluent/normal  Non-Invasive Vascular Imaging CAROTID DUPLEX 02/06/2012  Right ICA 0 - 19% stenosis Left ICA 40 - 59 % stenosis   ASSESSMENT/PLAN: Patient has a stable carotid duplex with some left ICA stenosis in the 40-59% range, therefore the plan will be to bring him back in one year for repeat carotid duplex and followup in my clinic his questions were encouraged and answered.  Lauree Chandler ANP   Clinic MD: Edilia Bo

## 2012-02-06 NOTE — Progress Notes (Signed)
Addended by: Sharee Pimple on: 02/06/2012 01:48 PM   Modules accepted: Orders

## 2012-02-07 ENCOUNTER — Ambulatory Visit (INDEPENDENT_AMBULATORY_CARE_PROVIDER_SITE_OTHER): Payer: Medicare Other | Admitting: *Deleted

## 2012-02-07 ENCOUNTER — Encounter: Payer: Self-pay | Admitting: Internal Medicine

## 2012-02-07 DIAGNOSIS — I428 Other cardiomyopathies: Secondary | ICD-10-CM

## 2012-02-08 LAB — REMOTE ICD DEVICE
BAMS-0001: 170 {beats}/min
BATTERY VOLTAGE: 3.0648 V
DEV-0020ICD: NEGATIVE
PACEART VT: 0
TOT-0006: 20090623000000
TZAT-0001FASTVT: 1
TZAT-0012SLOWVT: 200 ms
TZAT-0018FASTVT: NEGATIVE
TZAT-0018SLOWVT: NEGATIVE
TZAT-0019SLOWVT: 8 V
TZAT-0020SLOWVT: 1.5 ms
TZON-0003SLOWVT: 360 ms
TZON-0003VSLOWVT: 400 ms
TZON-0004SLOWVT: 16
TZST-0001FASTVT: 2
TZST-0001FASTVT: 4
TZST-0001FASTVT: 6
TZST-0001SLOWVT: 2
TZST-0001SLOWVT: 6
TZST-0002FASTVT: NEGATIVE
TZST-0002SLOWVT: NEGATIVE
TZST-0002SLOWVT: NEGATIVE
TZST-0002SLOWVT: NEGATIVE
VENTRICULAR PACING ICD: 0 pct
VF: 0

## 2012-02-15 ENCOUNTER — Encounter: Payer: Self-pay | Admitting: *Deleted

## 2012-02-18 NOTE — Progress Notes (Signed)
Remote icd check  

## 2012-02-20 NOTE — Procedures (Unsigned)
CAROTID DUPLEX EXAM  INDICATION:  Followup carotid endarterectomy 12/2010.  HISTORY: Diabetes:  No Cardiac:  Yes Hypertension:  Yes Smoking:  Yes Previous Surgery:  Right CEA 12/2010 CV History:  Asymptomatic Amaurosis Fugax No, Paresthesias No, Hemiparesis No                                      RIGHT             LEFT Brachial systolic pressure:         114               120 Brachial Doppler waveforms:         Triphasic         Biphasic Vertebral direction of flow:        Antegrade         Antegrade DUPLEX VELOCITIES (cm/sec) CCA peak systolic                   96 (proximal)     83 (distal) ECA peak systolic                   120               83 ICA peak systolic                   94 (distal)       165 (proximal) ICA end diastolic                   38                46 PLAQUE MORPHOLOGY:                  Soft              Calcific PLAQUE AMOUNT:                      Mild              Moderate PLAQUE LOCATION:                    Bifurcation, ICA  Bifurcation, ICA  IMPRESSION: 1. Patent carotid endarterectomy site with mild plaque, as described     above. 2. 40 to 59% left ICA stenosis, no significant change since prior     study of 07/20/2011.  NOTE:  Calcific plaque may obscure higher velocities.  ___________________________________________ Di Kindle. Edilia Bo, M.D.  SS/MEDQ  D:  02/06/2012  T:  02/06/2012  Job:  [id number]

## 2012-03-06 ENCOUNTER — Other Ambulatory Visit: Payer: Self-pay | Admitting: *Deleted

## 2012-03-06 MED ORDER — FENOFIBRATE 160 MG PO TABS: 160.0000 mg | ORAL_TABLET | Freq: Every day | ORAL | Status: DC

## 2012-03-19 DIAGNOSIS — H10019 Acute follicular conjunctivitis, unspecified eye: Secondary | ICD-10-CM | POA: Diagnosis not present

## 2012-03-27 DIAGNOSIS — H10019 Acute follicular conjunctivitis, unspecified eye: Secondary | ICD-10-CM | POA: Diagnosis not present

## 2012-04-09 ENCOUNTER — Ambulatory Visit (INDEPENDENT_AMBULATORY_CARE_PROVIDER_SITE_OTHER): Payer: Medicare Other | Admitting: Pulmonary Disease

## 2012-04-09 ENCOUNTER — Encounter: Payer: Self-pay | Admitting: Pulmonary Disease

## 2012-04-09 VITALS — BP 122/68 | HR 64 | Temp 97.0°F | Ht 71.0 in | Wt 167.4 lb

## 2012-04-09 DIAGNOSIS — I2589 Other forms of chronic ischemic heart disease: Secondary | ICD-10-CM | POA: Diagnosis not present

## 2012-04-09 DIAGNOSIS — E559 Vitamin D deficiency, unspecified: Secondary | ICD-10-CM

## 2012-04-09 DIAGNOSIS — I6529 Occlusion and stenosis of unspecified carotid artery: Secondary | ICD-10-CM

## 2012-04-09 DIAGNOSIS — I2510 Atherosclerotic heart disease of native coronary artery without angina pectoris: Secondary | ICD-10-CM

## 2012-04-09 DIAGNOSIS — M1990 Unspecified osteoarthritis, unspecified site: Secondary | ICD-10-CM

## 2012-04-09 DIAGNOSIS — I739 Peripheral vascular disease, unspecified: Secondary | ICD-10-CM

## 2012-04-09 DIAGNOSIS — M810 Age-related osteoporosis without current pathological fracture: Secondary | ICD-10-CM

## 2012-04-09 DIAGNOSIS — Z95810 Presence of automatic (implantable) cardiac defibrillator: Secondary | ICD-10-CM

## 2012-04-09 DIAGNOSIS — E7800 Pure hypercholesterolemia, unspecified: Secondary | ICD-10-CM

## 2012-04-09 DIAGNOSIS — J449 Chronic obstructive pulmonary disease, unspecified: Secondary | ICD-10-CM | POA: Diagnosis not present

## 2012-04-09 DIAGNOSIS — I10 Essential (primary) hypertension: Secondary | ICD-10-CM

## 2012-04-09 MED ORDER — ERGOCALCIFEROL 1.25 MG (50000 UT) PO CAPS: 50000.0000 [IU] | ORAL_CAPSULE | ORAL | Status: DC

## 2012-04-09 NOTE — Patient Instructions (Addendum)
Today we updated your med list in our EPIC system...    Continue your current medications the same...  We decided to add a weekly Vit D capsule- 50,000 u once weekly...  Please try to cut down on the cigars!  And increase your exercise program...  Call for any problems...  Let's plan a follow up visit in 6 months w/ CXR & FASTING blood work around that time.Marland KitchenMarland Kitchen

## 2012-04-12 ENCOUNTER — Encounter: Payer: Self-pay | Admitting: Pulmonary Disease

## 2012-04-12 NOTE — Progress Notes (Signed)
Subjective:    Patient ID: [name], male    DOB: 1931-09-24, 78 y.o.   MRN: [id number]  HPI 78 y/o WM here for a follow up visit... he has been followed closely by DrHochrein & DrKlein during the past years due to his cardiomyopathy & AICD...   ~  October 10, 2010:  Yearly ROV- doing well, he says w/o new complaints or concerns... states he had recent URI but toughed it out & better now... he saw DrKlein 6/11- f/u ischemic heart dis w/ decr LVF & AICD> noted some postural changes, hx syncope, on-going smoking, AICD was OK & no changes made...  he saw DrHochrein 8/11- cardiomyopathy, EF 40-45% on last 2DEcho, syncopal epis related to postural BP changes & he decreased his meds (Coreg3.125Bid & Lisin5Bid) & no prob since then...  he had CDopplers Q70mo w/ 60-79% bilat ICAstenoses (mod to severe mixed irreg plaque) on ASA daily & no cerebral ischemic symptoms...  he's had some on-going LBP & XRay 8/11 w/o acute changes (prev T12 augmentation, NAD).Marland Kitchen. up to date on his vaccinations and doesn't need refill perscriptions today.  ~  February 16, 2011:  Keatyn had a 53mo f/u CDoppler 2/12 w/ worsening RICA velocities c/w 80-99% stenosis (stable 60-79% LICA stenosis);  He was evaluated by [facility] DrDickson & underwent a right CAE w/ DPA 3/12 without complications;  He has been doing satis post-op on his ASA 81mg /d & they plan f/u CDoppler in 53mo intervals going forward...      On 02/09/11 he was bringing groceries into the house when his legs gave way & he fell backwards striking his occiput & right side on the ground;  ER eval revealed signif contusions, ?right rib fxs, hematoma on occiput, etc;  Work up included CXR, Lumbar films, CT Brain & CSpine> see results below;  Not adm & Rx as outpt w/ rest, heat, Vicodin, etc...  Family called w/ concern for his pain level, not eating or drinking enough, incr wheezing & SOB> we called in Pred dosepak, Proair inhaler, Mucinex 2Bid w/ fluids;  They report much improved on this  rx> eating better, more mobile, still w/ pain, bruise right flank, tender, etc; and they note weak, weak voice, some reflux symptoms, hard to swallow Tricor capsule, etc...    We discussed finishing out the Pred, continue IS (for deep breathing), Proair, Mucinex, rib binder, heating pad, Protonix, GI eval for swallowing by DrPatterson, lax meds as needed...  ~  May 30,2012:  Cashis did not take the Protonix & didn't see GI as he felt that his reflux improved on its own & swallowing is OK now; he also feels that his voice has improved back to baseline; he has resumed some smoking (cigars) & Etoh according to his daughter who accompanies him today;  He has remained ?dizzy w/ postural BP changes and "passing out" he says related to his BP meds> prev on Lisinopril 5mg Bid & Coreg 3.125mg Bid- he decr to just one of each Qam in mid-May after talking to DrHochrein, but still had the afternoon symptoms so he stopped the meds completely one week ago;  BP today shows prominent postural BP changes w/ BP 130/70 supine & 100/60 sitting & standing...    Review of the record shows he last saw DrHochrein 8/11 w/ similar symptoms- syncopal spell at home, believed due to transient low BP, Lisinopril & Coreg adjusted at that time for his Cardiomyopathy> last 2DEcho was 4/10 showing mild LVH, mod reduced LVF w/ EF=40-45%  w/ inferobasal & post HK, mild MR, paradoxical septal motion;  We reviewed meds, Labs/ XRays/ ER note from 4/12...  Asked to leave the Lisinopril & Coreg off for now, stop the smoking & Etoh, monitor BP when sitting/ standing, follow up w/ Cards ASAP...  ~  May 22, 2011:  76mo ROV & he saw DrHochrein 5/12 & DrKlein 7/12 for f/u of his Ischemic heart dis & depressed LVF w/ EF=40-45% on 2DEcho 4/11, w/ ICD implant for primary prevention (no discharges- device doing well); he has been off his Coreg & Lisinopril due to Syncope felt related to postural hypotension (now using support hose) but DrKlein wanted him back on  these meds starting w/ low doses> he had f/u 2DEcho 7/12 showing mild LVH, EF~35-40%, Gr 1 DD, & rec to restart meds per DrKlein; pt declined Cardiac Rehab.    He tells me that he has quit Etoh & feeling better> BP 140/70, improved, no postural changes & prev dizziness resolved;  he is back on LISINOPRIL 5mg  Qhs;  We reviewed prev labs & reminded of need for f/u FLP...  ~  October 10, 2011:  76mo ROV & he is improved overall (he credits quitting Etoh), notes some itching w/ eval by Derm (given cream for itching), otherw no new complaints or concerns... COPD> he has Proair for prn use; doing better w/ cut back on cigar consumption... HBP> BP= 128/74 today on Coreg3.125Bid & Lisinopril5; denies CP, palpit, dizzy/syncope, ch in SOB/DOE, edema... ASHD/ Cardiomyopathy> improved w/ BBlocker/ ACE rx, not requiring diuretic; he saw DrHochrein 11/12> doing better, no changes made. Periph Vasc Dis> AAA repair 2000 by [facility], still too sedentary & he declined cardiac rehab; s/p R CAE w/ DPA 3/12 by DrCDickson; f/u CDoppler by [facility] 9/12 showed patent rightCAE site & 40-59% left ICA stenosis; they plan regular follow ups... CHOL> on Simva40 + fenofib160 & FLP looks good today (see below)... GI> GERD, Divertics, Polyps,etc>  GI reported stable on incr Fiber intake... DJD, osteoporosis, compression fx>  Vit D level is low at 17 & rec to take Calcium, Men's formula MVI, Vit D OTC 5000u daily... DERM> he saw Derm re: his rash (?seb dermatitis) and itching; labs show 22% eos & rec to take Antihist eg Buel Ream, Zyrkek daily...  ~  April 09, 2012:  76mo ROV & Brittian says he is doing fine- no new complaints or concerns; he has a dry skin dermatitis & has seen Jeronimo Norma; He never did start the Vit D supplement after our last OV & today we wrote for 50K weekly Rx... COPD> he has Proair for prn use; doing better w/ cut back on cigar consumption... HBP> on Coreg3.125Bid & Lisinopril5; BP= 122/68 & denies CP, palpit,  dizzy/syncope, ch in SOB/DOE, edema... ASHD/ Cardiomyopathy> on ASA 81mg /d; improved w/ BBlocker/ ACE rx, not requiring diuretic and denies CP/ angina, etc... Periph Vasc Dis> AAA repair 2000 by [facility], still too sedentary & he declined cardiac rehab; s/p R CAE w/ DPA 3/12 by DrCDickson; f/u CDoppler by [facility] 9/12 showed patent rightCAE site & 40-59% left ICA stenosis; they plan regular follow ups... CHOL> on Simva40 + Fenofib160; FLP 12/12 looked ok- continue same... GI> GERD, Divertics, Polyps,etc>  GI reported stable on incr Fiber intake... DJD, osteoporosis, compression fx>  Vit D level is low at 17 & rec to take Calcium, Men's formula MVI, & Rx written for VitD 50K weekly... DERM> he saw Derm re: his rash (?seb dermatitis) and itching; labs show 22%  eos & rec to take Antihist eg Buel Ream, Zyrkek daily...    We reviewed prob list, meds, xrays and labs> see below>>         Problem List:  COPD (ICD-496) - despite all efforts Avyaan continues to smoke 3-4 cigars per day... he has min cough, some phlegm, but denies CP, SOB, wheezing, etc...  He doesn't want Chantix or help w/ smoking cessation;  He has a PROAIR inhaler for Prn use but he seldom uses it, & he knows to use the OTC MUCINEX 1-2 Bid w/ fluids for congestion. ~  CXR 3/12 in hosp for CAE showed COPD/E, biapical pleuroparenchymal scarring, NAD, AICD on left, old left rib fx, old T12 vertebroplasty... ~  Fall at home 4/12 w/ signif trauma & prob right rib fxs (CXR in ER showed some atelec but NAD & no rib films done).  HYPERTENSION (ICD-401.9) - back on COREG 3.125Bid & LISINOPRIL 5mg Qhs... Prev had to wean off these due to dizziness & post hypotension (resolved off Etoh). ~  12/11:  BP= 126/70 and doing well> denies HA, fatigue, visual changes, CP, palipit, dizziness, dyspnea, edema, etc; he has had postural hypotension & several syncopal episodes related to this- now improved w/ the final adjustment in his meds. ~  4/12:  BP= 90/58  ==>100/60 recheck & he is weak; asked to monitor BP at home & may need to decr meds. ~  5/12:  BP= 130/70 supine & 100/60 sitting & standing (no symptoms at present)> he has been off the Lisinopril & Coreg for 1wk now. ~  7/12:  BP= 120/72 & 140/70 w/o postural changes (improved off etoh); he is back on Lisinopril 5mg  Qhs per Cards. ~  12/12:  BP= 128/74 on Coreg3.125Bid & Lisinopril5; denies CP, palpit, dizzy/syncope, ch in SOB/DOE, edema... ~  6/13:  BP= 122/68 & he remains largely asymptomatic...  ATHEROSCLEROTIC HEART DISEASE (ICD-414.00) - on ASA 81mg /d + above meds... ISCHEMIC CARDIOMYOPATHY (ICD-414.8) Hx of SYNCOPE (ICD-780.2) & IMPLANTABLE DEFIBRILLATOR, DDD MDT (ICD-V45.02) ~  Cath in 2003 w/ 2 vessel CAD and stent placed in LAD...  ~  Cardiolite 1/08 w/ large inferolat infarct, no ischemia, EF=36%...  ~  2DEcho 2/08 w/ infer & post HK, EF=40%...  ~  recath 6/09 w/ heavily calcif vessels & 40% EF- DrHochrein has been following carefully and adjusting meds. ~  AICD placed 2009 by DrKlein for hx syncope & ischemic cardiomyopathy- followed by DrKlein yearly & doing satis. ~  2DEcho 4/10 showed mild LVH, mod reduced LVF w/ EF=40-45% w/ inferobasal & post HK, mild MR, paradoxical septal motion. ~  9/10: Cards tried to incr the Coreg to 9.375mg Bid but pt intol & went back to 6.25Bid. ~  8/11:  f/u by DrHochrein w/ recent syncopal episode & meds adjusted Coreg 3.125Bid & Lisinopril 5Bid. ~  Serial XRays have all revealed signif atherosclerotic changes diffusely (eg- lumbar films 4/12, CT neck 4/12 as well). ~  4/12> ER visit for fall at home w/ signif trauma> ?post BP related, ?med related, ?other etiology > Cards eval & weaned off Coreg/ Lisinopril w/ resolution of postural hypotension. ~  7/12:  DrKlein restarted Lisin 5mg Qhs, BP improved, no further postural changes, f/u 2DEcho w/ EF=35-40% & Gr1DD... ~  12/12:  DrHochrein has him back on Coreg3.125Bid & Lisinopril5/d & stable  overall...  CEREBROVASCULAR DISEASE PERIPHERAL VASCULAR DISEASE (ICD-443.9) - on ASA 81mg /d...  ~  He is s/p AAA repair 2000 by [facility]... he is sedentary and hasn't had ABI's  checked... I will leave this to DrHochrein... ~  CDopplers 7/10, 1/1,1 & 8/11 showed stable mod carotid dis bilat w/ heavy calcif plaque & 60-79% bilat ICA stenoses... ~  CDopplers 2/12 w/ worsening RICA velocities c/w 80-99% stenosis (stable 60-79% LICA stenosis);  He was evaluated by [facility] DrDickson & underwent a right CAE w/ DPA 3/12 without complications;  He has been doing satis post-op & they plan f/u CDoppler in 45mo intervals going forward... ~  NOTE:  CT Brain 4/12 in ER showed mild to mod cortical vol loss & cbll atrophy, sm vessel dis, no acute changes... Prom vasc calcif seen on neck films & in abd...  HYPERCHOLESTEROLEMIA (ICD-272.0) - on SIMVASTATIN 40mg /d & FENOFIBRATE 160/d. ~  FLP 4/08 showed TChol 131, TG 100, HDL 41, LDL 70 ~  FLP 2/09 showed TChol 124, TG 132, HDL 37, LDL 61 ~  FLP 12/10 > pt never ret for FLP & insurance changed Vytorin to [facility]. ~  FLP 12/11 showed TChol 112, TG 51, HDL 43, LDL 59 ~  4/12:  They report some difficult swallowing the Tricor capsule==> referred to GI for swallowing eval & switched to FENOFIBRATE 160mg /d. ~  FLP 12/12 on Simva40+Feno160 showed TChol 124, TG 78, HDL 37, LDL 72  GERD/ DYSPHAGIA> ~  4/12: pt noted some reflux symptoms & mild dysphagia for large capsule; Protonix 40mg /d started & refer to GI for eval; they also note weak voice & may need ENT eval if not resolved in follow up... ~  5/12:  Pt states all symptoms resolved on their own, didn't take PPI, didn't see GI, denies swallowing or voice issues now.  DIVERTICULOSIS OF COLON (ICD-562.10) > he takes fiber supplement daily. COLONIC POLYPS (ICD-211.3) HEMORRHOIDS (ICD-455.6) - last colonoscopy was 11/06 by DrPatterson showing divertics, several 1-91mm polyps (hyperplastic), and hems...  Hx of LIVER FUNCTION  TESTS, ABNORMAL (ICD-794.8) - improved off etoh... ~  labs 2/09 showed SGOT= 30, SGPT= 17 ~  labs 12/11 showed SGOT= 38, SGPT= 19 ~  Labs 12/12 off all etoh & LFTs all wnl...  DEGENERATIVE JOINT DISEASE (ICD-715.90) Hx of GOUT (ICD-274.9) - on ALLOPURINOL 100mg /d... ~  labs 2/09 showed Uric= 3.3  OSTEOPOROSIS (ICD-733.00) - supposed to be on Caltrate, MVI, Vit D... he had T12 compression after syncopal spell 2009 w/ vertebroplasty by DrDeveshwar...  BMD here 10/09 showed TScores +0.3 in Spine, & -2.7 in right FemNeck (Ortho eval by Janett Billow)... ~  labs 12/11 showed Vit D level = 22... rec to take Men's MVI + Vit D 2000 u daily. ~  Labs 12/12 showed Vit D level = 17; REC> start regular dosing of Calcium, Men's MVI, Vit D 5000u daily... ~  6/13: he never got the OTC Vit d so we will Rx w/ VitD 50K weekly Rx now...  DERMATITIS (ICD-692.9) - he has eosinophilia & saw Derm w/ rx for topical cream;  rec to take antihist as well...  Health Maintenance -  ~  GI: colonoscopy 11/06 w/ several hyperplastic polyps removed... ~  GU: PSA 12/12 = 1.50 ~  Immunizations:  he tells me that he had PNEUMOVAX & 2010 Flu shot 10/10... received TETANUS shot here 2003...   Past Surgical History  Procedure Date  . Abdominal aortic aneurysm repair 2002    by Dr. Hart [place]  . Cad stent 02/2002    Dr. Antoine Poche  . Aicd placed 03/2008    Dr. Graciela Husbands  . Right corotid enderectomy 12/2010    Dr. Edilia Bo  . Carotid  endarterectomy 2010    Outpatient Encounter Prescriptions as of 04/09/2012  Medication Sig Dispense Refill  . albuterol (PROAIR HFA) 108 (90 BASE) MCG/ACT inhaler Inhale 2 puffs into the lungs every 4 (four) hours as needed for wheezing.  1 Inhaler  3  . allopurinol (ZYLOPRIM) 100 MG tablet Take 1 tablet (100 mg total) by mouth daily.  30 tablet  5  . aspirin 81 MG tablet Take 81 mg by mouth daily.        . carvedilol (COREG) 3.125 MG tablet Take 1 tablet (3.125 mg total) by mouth 2 (two) times daily.  60  tablet  11  . cetirizine (ZYRTEC) 10 MG tablet Take 10 mg by mouth daily.        . fenofibrate 160 MG tablet Take 1 tablet (160 mg total) by mouth daily.  30 tablet  1  . Inulin (FIBERCHOICE PO) Take 2 tablets by mouth daily.       Marland Kitchen lisinopril (PRINIVIL,ZESTRIL) 5 MG tablet Take 1 tablet (5 mg total) by mouth at bedtime.  30 tablet  11  . Multiple Vitamins-Minerals (OCUVITE PO) 1 tab twice a day      . simvastatin (ZOCOR) 40 MG tablet Take 1 tablet (40 mg total) by mouth at bedtime.  90 tablet  3  . ergocalciferol (VITAMIN D2) 50000 UNITS capsule Take 1 capsule (50,000 Units total) by mouth once a week.  12 capsule  3    No Known Allergies   Current Medications, Allergies, Past Medical History, Past Surgical History, Family History, and Social History were reviewed in [employer] record.    Review of Systems         See HPI - all other systems neg except as noted... The patient complains of dyspnea on exertion, prolonged cough, and difficulty walking.  The patient denies anorexia, fever, weight loss, weight gain, vision loss, decreased hearing, hoarseness, chest pain, syncope, peripheral edema, headaches, hemoptysis, abdominal pain, melena, hematochezia, severe indigestion/heartburn, hematuria, incontinence, muscle weakness, suspicious skin lesions, transient blindness, depression, unusual weight change, abnormal bleeding, enlarged lymph nodes, and angioedema.     Objective:   Physical Exam      WD, WN, 78 y/o WM > chr ill appearing but NAD... GENERAL:  Alert & oriented; pleasant & cooperative... HEENT:  /AT, EOM-full, PERRLA, EACs-clear  NOSE-clear, THROAT-clear & wnl, Voice sounds back to norm NECK:  Supple w/ fairROM; no JVD; prominent carotid impulses, scar on right, + bruits; no thyromegaly or nodules palpated; no lymphadenopathy... CHEST:  bilat rhonchi at bases & end-exp wheezing,  no rales, no signs of consolidation... HEART:  Regular Rhythm; gr 1/6  SEM, S4, no rubs... ABDOMEN:  Min Bruise in right flank, mild tender lower ribs; normal bowel sounds; no organomegaly or masses detected. EXT: without deformities, mild arthritic changes; no varicose veins/ +venous insuffic/ no edema. NEURO: no focal neuro deficits... DERM:  dry skin dermatitis, seborrhea, rosacea...  RADIOLOGY DATA:  Reviewed in the EPIC EMR & discussed w/ the patient...  LABORATORY DATA:  Reviewed in the EPIC EMR & discussed w/ the patient...   Assessment & Plan:   COPD>  Stable, on prn Proair only, he has restarted smoking his cigars & encouraged to quit completely!  He declines Chantix etc...  Hx HBP & Postural Hypotension>  BP normal now & tolerating Coreg 3.125Bid + Lisinopril 5mg  Qhs; no postural BP changes noted today & his dizziness has resolved; he notes all symptoms improved off etoh...  ASHD/ Cardiomyop/  AICD>  Followed by DHochrein & Graciela Husbands;  Continue current meds & f/u by Cards.  Periph Vasc Dis>  Followed by [facility], DrDickson & CDoppler 9/12 showed patent right CAE site w/ 40-59% left ICA stenosis & they are following...  GI/ Dysphagia>  He notes symptoms resolved spont & he does not believe that he has a problem in this area, declines PPI Rx or GI eval...  FALL 4/12 w/ signif trauma>  Hx of trauma to right chest wall/ flank/ etc... Improved w/ rest, heat, rib binder, pain meds, IS, Proair, Mucinex, & Pred... More mobile & feeling better; He is off the prev Vicodin etc;  Note compression fx, osteopenia, low vit d level==> he needs as a minimum Calcium/ Men's MVI/ Vit D 5000u daily==> start Vit D 50K weekly now...  Other medical issues as noted...   Patient's Medications  New Prescriptions   ERGOCALCIFEROL (VITAMIN D2) 50000 UNITS CAPSULE    Take 1 capsule (50,000 Units total) by mouth once a week.  Previous Medications   ALBUTEROL (PROAIR HFA) 108 (90 BASE) MCG/ACT INHALER    Inhale 2 puffs into the lungs every 4 (four) hours as needed for wheezing.    ALLOPURINOL (ZYLOPRIM) 100 MG TABLET    Take 1 tablet (100 mg total) by mouth daily.   ASPIRIN 81 MG TABLET    Take 81 mg by mouth daily.     CARVEDILOL (COREG) 3.125 MG TABLET    Take 1 tablet (3.125 mg total) by mouth 2 (two) times daily.   CETIRIZINE (ZYRTEC) 10 MG TABLET    Take 10 mg by mouth daily.     FENOFIBRATE 160 MG TABLET    Take 1 tablet (160 mg total) by mouth daily.   INULIN (FIBERCHOICE PO)    Take 2 tablets by mouth daily.    LISINOPRIL (PRINIVIL,ZESTRIL) 5 MG TABLET    Take 1 tablet (5 mg total) by mouth at bedtime.   MULTIPLE VITAMINS-MINERALS (OCUVITE PO)    1 tab twice a day   SIMVASTATIN (ZOCOR) 40 MG TABLET    Take 1 tablet (40 mg total) by mouth at bedtime.  Modified Medications   No medications on file  Discontinued Medications   No medications on file

## 2012-04-29 ENCOUNTER — Ambulatory Visit (INDEPENDENT_AMBULATORY_CARE_PROVIDER_SITE_OTHER): Payer: Medicare Other | Admitting: Internal Medicine

## 2012-04-29 ENCOUNTER — Encounter: Payer: Self-pay | Admitting: Internal Medicine

## 2012-04-29 VITALS — BP 128/62 | HR 66 | Resp 18 | Ht 71.0 in | Wt 168.4 lb

## 2012-04-29 DIAGNOSIS — I2589 Other forms of chronic ischemic heart disease: Secondary | ICD-10-CM

## 2012-04-29 DIAGNOSIS — Z95810 Presence of automatic (implantable) cardiac defibrillator: Secondary | ICD-10-CM

## 2012-04-29 DIAGNOSIS — I4891 Unspecified atrial fibrillation: Secondary | ICD-10-CM | POA: Diagnosis not present

## 2012-04-29 LAB — ICD DEVICE OBSERVATION
AL AMPLITUDE: 1.5 mv
AL IMPEDENCE ICD: 475 Ohm
ATRIAL PACING ICD: 5.23 pct
CHARGE TIME: 8.798 s
DEV-0020ICD: NEGATIVE
RV LEAD IMPEDENCE ICD: 494 Ohm
TOT-0001: 2
TOT-0006: 20090623000000
TZAT-0001FASTVT: 1
TZAT-0012FASTVT: 200 ms
TZAT-0019SLOWVT: 8 V
TZAT-0020FASTVT: 1.5 ms
TZAT-0020SLOWVT: 1.5 ms
TZON-0003SLOWVT: 360 ms
TZON-0003VSLOWVT: 400 ms
TZON-0004VSLOWVT: 28
TZON-0005SLOWVT: 12
TZST-0001FASTVT: 2
TZST-0001FASTVT: 3
TZST-0001FASTVT: 4
TZST-0001SLOWVT: 2
TZST-0001SLOWVT: 3
TZST-0001SLOWVT: 5
TZST-0002FASTVT: NEGATIVE
TZST-0002FASTVT: NEGATIVE
TZST-0002SLOWVT: NEGATIVE
TZST-0002SLOWVT: NEGATIVE
TZST-0002SLOWVT: NEGATIVE
VENTRICULAR PACING ICD: 0 pct

## 2012-04-29 NOTE — Assessment & Plan Note (Signed)
Continue current medications. 

## 2012-04-29 NOTE — Assessment & Plan Note (Signed)
Atrial fibrillation is detected on his device. He has had 30 minutes on a couple occasions but none in the last year. He has multiple cardiac risk factors and it would be a reasonable thing to begin to consider the role of oral anticoagulation, specifically the NOACs. More specifically, I would have a low threshold for using apixoban especially given the results of the averroes trial. I discussed this with Dr. Davonna Belling. He is concerned about ongoing alcohol use. We will follow this along for now.

## 2012-04-29 NOTE — Assessment & Plan Note (Signed)
The patient's device was interrogated.  The information was reviewed. No changes were made in the programming.    

## 2012-04-29 NOTE — Patient Instructions (Signed)

## 2012-04-29 NOTE — Progress Notes (Signed)
HPI  [name] is a 78 y.o. male seen in followup for ischemic heart disease modest depression of LV function with some interval improvement with ejection fraction of 40-45% by echo in April2011; he status post ICD implantation for primary prevention. There have been no intercurrent discharges.  He says he is "slight" he denies chest pain or shortness of breath. He is not aware of any tachypalpitations. He continues to smoke.        Past Medical History  Diagnosis Date  . Allergic rhinitis   . COPD (chronic obstructive pulmonary disease)   . Tobacco abuse   . Hypertension   . Atherosclerotic heart disease   . Ischemic cardiomyopathy   . Syncope   . Presence of cardiac defibrillator   . Peripheral vascular disease   . Hypercholesterolemia   . Diverticulosis of colon   . History of colonic polyps   . Hemorrhoids   . Abnormal liver function tests   . DJD (degenerative joint disease)   . History of gout   . Back pain   . Compression fracture   . Osteoporosis   . Dermatitis     Past Surgical History  Procedure Date  . Abdominal aortic aneurysm repair 2002    by Dr. Hart Rochester  . Cad stent 02/2002    Dr. Antoine Poche  . Aicd placed 03/2008    Dr. Graciela Husbands  . Right corotid enderectomy 12/2010    Dr. Edilia Bo  . Carotid endarterectomy 2010    Current Outpatient Prescriptions  Medication Sig Dispense Refill  . albuterol (PROAIR HFA) 108 (90 BASE) MCG/ACT inhaler Inhale 2 puffs into the lungs every 4 (four) hours as needed for wheezing.  1 Inhaler  3  . allopurinol (ZYLOPRIM) 100 MG tablet Take 1 tablet (100 mg total) by mouth daily.  30 tablet  5  . aspirin 81 MG tablet Take 81 mg by mouth daily.        . carvedilol (COREG) 3.125 MG tablet Take 1 tablet (3.125 mg total) by mouth 2 (two) times daily.  60 tablet  11  . cetirizine (ZYRTEC) 10 MG tablet Take 10 mg by mouth daily.        . ergocalciferol (VITAMIN D2) 50000 UNITS capsule Take 1 capsule (50,000 Units total) by mouth once  a week.  12 capsule  3  . fenofibrate 160 MG tablet Take 1 tablet (160 mg total) by mouth daily.  30 tablet  1  . Inulin (FIBERCHOICE PO) Take 2 tablets by mouth daily.       Marland Kitchen lisinopril (PRINIVIL,ZESTRIL) 5 MG tablet Take 1 tablet (5 mg total) by mouth at bedtime.  30 tablet  11  . Multiple Vitamins-Minerals (OCUVITE PO) 1 tab twice a day      . simvastatin (ZOCOR) 40 MG tablet Take 1 tablet (40 mg total) by mouth at bedtime.  90 tablet  3    No Known Allergies  Review of Systems negative except from HPI and PMH  Physical Exam BP 128/62  Pulse 66  Resp 18  Ht 5\' 11"  (1.803 m)  Wt 168 lb 6.4 oz (76.386 kg)  BMI 23.49 kg/m2  SpO2 99% Well developed and well nourished in no acute distress HENT normal E scleral and icterus clear Neck Supple JVP flat; carotids brisk and full Clear to ausculation Regular rate and rhythm, no murmurs gallops or rub Soft with active bowel sounds No clubbing cyanosis none Edema Alert and oriented, grossly normal motor and sensory function Skin  Warm and Dry  Electrocardiogram demonstrates sinus rhythm at 72 Interval 17/15/45 Axis is 90 Right bundle branch block Inferior infarct Assessment and  Plan

## 2012-05-02 NOTE — Addendum Note (Signed)
Addended by: Lacie Scotts on: 05/02/2012 03:22 PM   Modules accepted: Orders

## 2012-05-06 ENCOUNTER — Encounter: Payer: Medicare Other | Admitting: Internal Medicine

## 2012-05-07 ENCOUNTER — Other Ambulatory Visit: Payer: Self-pay | Admitting: *Deleted

## 2012-05-07 MED ORDER — FENOFIBRATE 160 MG PO TABS: 160.0000 mg | ORAL_TABLET | Freq: Every day | ORAL | Status: DC

## 2012-05-13 DIAGNOSIS — Z961 Presence of intraocular lens: Secondary | ICD-10-CM | POA: Diagnosis not present

## 2012-05-13 DIAGNOSIS — H02409 Unspecified ptosis of unspecified eyelid: Secondary | ICD-10-CM | POA: Diagnosis not present

## 2012-05-13 DIAGNOSIS — H019 Unspecified inflammation of eyelid: Secondary | ICD-10-CM | POA: Diagnosis not present

## 2012-05-13 DIAGNOSIS — H02839 Dermatochalasis of unspecified eye, unspecified eyelid: Secondary | ICD-10-CM | POA: Diagnosis not present

## 2012-05-13 DIAGNOSIS — H01009 Unspecified blepharitis unspecified eye, unspecified eyelid: Secondary | ICD-10-CM | POA: Diagnosis not present

## 2012-05-13 DIAGNOSIS — H029 Unspecified disorder of eyelid: Secondary | ICD-10-CM | POA: Diagnosis not present

## 2012-05-28 ENCOUNTER — Telehealth: Payer: Self-pay | Admitting: Pulmonary Disease

## 2012-05-28 NOTE — Telephone Encounter (Signed)
Called, spoke with Dois Davenport with with Dr. Rubye Oaks office.  States they faxed a form over on July 26 but hasn't heard anything back.  States pt has eye surgery pending approval from Dr. Kriste Basque to stop asa 7-10 days prior to this.  They need this approval before scheduling this.  They will have pt restart asa the day after surgery.  Dois Davenport is refaxing form to triage as they will need Dr. Jodelle Green response in writing.  Fax received and given to Leigh.  Dr. Kriste Basque, pls advise.  Thank you.

## 2012-05-28 NOTE — Telephone Encounter (Signed)
Form has been signed by [facility] and faxed back to Dr. Carilyn Goodpasture office and i called and spoke with sandra and she stated that she did receive this form back.  i called and spoke with the pt and informed him that they will stop his aspirin 7 days prior to the surgery and pt is ok to restart 1 day after his surgery. Pt voiced his understanding of this. Nothing further needed.  Form has been placed in SN scan folder.

## 2012-06-24 DIAGNOSIS — I4510 Unspecified right bundle-branch block: Secondary | ICD-10-CM | POA: Diagnosis not present

## 2012-06-24 DIAGNOSIS — Z01811 Encounter for preprocedural respiratory examination: Secondary | ICD-10-CM | POA: Diagnosis not present

## 2012-06-24 DIAGNOSIS — Z01810 Encounter for preprocedural cardiovascular examination: Secondary | ICD-10-CM | POA: Diagnosis not present

## 2012-06-24 DIAGNOSIS — J449 Chronic obstructive pulmonary disease, unspecified: Secondary | ICD-10-CM | POA: Diagnosis not present

## 2012-06-25 ENCOUNTER — Telehealth: Payer: Self-pay | Admitting: Pulmonary Disease

## 2012-06-25 MED ORDER — ALLOPURINOL 100 MG PO TABS: 100.0000 mg | ORAL_TABLET | Freq: Every day | ORAL | Status: DC

## 2012-06-25 NOTE — Telephone Encounter (Signed)
Called refill to pharmacy-never got request from pharmacy. Pharmacy will contact patient.

## 2012-06-30 DIAGNOSIS — M109 Gout, unspecified: Secondary | ICD-10-CM | POA: Diagnosis not present

## 2012-06-30 DIAGNOSIS — Z7982 Long term (current) use of aspirin: Secondary | ICD-10-CM | POA: Diagnosis not present

## 2012-06-30 DIAGNOSIS — F17200 Nicotine dependence, unspecified, uncomplicated: Secondary | ICD-10-CM | POA: Diagnosis not present

## 2012-06-30 DIAGNOSIS — Z95810 Presence of automatic (implantable) cardiac defibrillator: Secondary | ICD-10-CM | POA: Diagnosis not present

## 2012-06-30 DIAGNOSIS — H029 Unspecified disorder of eyelid: Secondary | ICD-10-CM | POA: Diagnosis not present

## 2012-06-30 DIAGNOSIS — I10 Essential (primary) hypertension: Secondary | ICD-10-CM | POA: Diagnosis not present

## 2012-06-30 DIAGNOSIS — J449 Chronic obstructive pulmonary disease, unspecified: Secondary | ICD-10-CM | POA: Diagnosis not present

## 2012-06-30 DIAGNOSIS — I252 Old myocardial infarction: Secondary | ICD-10-CM | POA: Diagnosis not present

## 2012-06-30 DIAGNOSIS — Z9849 Cataract extraction status, unspecified eye: Secondary | ICD-10-CM | POA: Diagnosis not present

## 2012-06-30 DIAGNOSIS — I2510 Atherosclerotic heart disease of native coronary artery without angina pectoris: Secondary | ICD-10-CM | POA: Diagnosis not present

## 2012-06-30 DIAGNOSIS — E785 Hyperlipidemia, unspecified: Secondary | ICD-10-CM | POA: Diagnosis not present

## 2012-06-30 DIAGNOSIS — Z9889 Other specified postprocedural states: Secondary | ICD-10-CM | POA: Diagnosis not present

## 2012-06-30 DIAGNOSIS — H019 Unspecified inflammation of eyelid: Secondary | ICD-10-CM | POA: Diagnosis not present

## 2012-07-07 ENCOUNTER — Other Ambulatory Visit: Payer: Self-pay | Admitting: Pulmonary Disease

## 2012-07-08 DIAGNOSIS — L989 Disorder of the skin and subcutaneous tissue, unspecified: Secondary | ICD-10-CM | POA: Diagnosis not present

## 2012-07-08 DIAGNOSIS — H029 Unspecified disorder of eyelid: Secondary | ICD-10-CM | POA: Diagnosis not present

## 2012-07-08 DIAGNOSIS — D0439 Carcinoma in situ of skin of other parts of face: Secondary | ICD-10-CM | POA: Diagnosis not present

## 2012-07-14 DIAGNOSIS — H04129 Dry eye syndrome of unspecified lacrimal gland: Secondary | ICD-10-CM | POA: Diagnosis not present

## 2012-07-15 DIAGNOSIS — Z23 Encounter for immunization: Secondary | ICD-10-CM | POA: Diagnosis not present

## 2012-07-22 DIAGNOSIS — L259 Unspecified contact dermatitis, unspecified cause: Secondary | ICD-10-CM | POA: Diagnosis not present

## 2012-07-22 DIAGNOSIS — D0439 Carcinoma in situ of skin of other parts of face: Secondary | ICD-10-CM | POA: Diagnosis not present

## 2012-07-22 DIAGNOSIS — D235 Other benign neoplasm of skin of trunk: Secondary | ICD-10-CM | POA: Diagnosis not present

## 2012-08-04 ENCOUNTER — Encounter: Payer: Self-pay | Admitting: Internal Medicine

## 2012-08-04 ENCOUNTER — Ambulatory Visit (INDEPENDENT_AMBULATORY_CARE_PROVIDER_SITE_OTHER): Payer: Medicare Other | Admitting: *Deleted

## 2012-08-04 DIAGNOSIS — Z95810 Presence of automatic (implantable) cardiac defibrillator: Secondary | ICD-10-CM

## 2012-08-04 DIAGNOSIS — I2589 Other forms of chronic ischemic heart disease: Secondary | ICD-10-CM

## 2012-08-06 LAB — REMOTE ICD DEVICE
AL IMPEDENCE ICD: 532 Ohm
BAMS-0001: 170 {beats}/min
BATTERY VOLTAGE: 3.0443 V
CHARGE TIME: 8.798 s
DEV-0020ICD: NEGATIVE
PACEART VT: 0
RV LEAD AMPLITUDE: 4.125 mv
RV LEAD IMPEDENCE ICD: 551 Ohm
TOT-0001: 2
TOT-0002: 0
TOT-0006: 20090623000000
TZAT-0002SLOWVT: NEGATIVE
TZAT-0012FASTVT: 200 ms
TZAT-0012SLOWVT: 200 ms
TZAT-0018FASTVT: NEGATIVE
TZAT-0020FASTVT: 1.5 ms
TZST-0001FASTVT: 4
TZST-0001FASTVT: 5
TZST-0001FASTVT: 6
TZST-0001SLOWVT: 3
TZST-0001SLOWVT: 4
TZST-0001SLOWVT: 5
TZST-0002FASTVT: NEGATIVE
TZST-0002FASTVT: NEGATIVE
TZST-0002SLOWVT: NEGATIVE
TZST-0002SLOWVT: NEGATIVE

## 2012-08-15 ENCOUNTER — Other Ambulatory Visit: Payer: Self-pay | Admitting: Pulmonary Disease

## 2012-08-22 ENCOUNTER — Encounter: Payer: Self-pay | Admitting: *Deleted

## 2012-08-26 DIAGNOSIS — Z85828 Personal history of other malignant neoplasm of skin: Secondary | ICD-10-CM | POA: Diagnosis not present

## 2012-10-08 ENCOUNTER — Telehealth: Payer: Self-pay | Admitting: Pulmonary Disease

## 2012-10-08 DIAGNOSIS — I2589 Other forms of chronic ischemic heart disease: Secondary | ICD-10-CM

## 2012-10-08 MED ORDER — LISINOPRIL 5 MG PO TABS: 5.0000 mg | ORAL_TABLET | Freq: Every day | ORAL | Status: DC

## 2012-10-08 NOTE — Telephone Encounter (Signed)
Refill sent to the pharmacy 

## 2012-10-23 ENCOUNTER — Other Ambulatory Visit: Payer: Self-pay | Admitting: *Deleted

## 2012-10-23 MED ORDER — ALBUTEROL SULFATE HFA 108 (90 BASE) MCG/ACT IN AERS: 2.0000 | INHALATION_SPRAY | Freq: Four times a day (QID) | RESPIRATORY_TRACT | Status: DC | PRN

## 2012-10-23 MED ORDER — ALLOPURINOL 100 MG PO TABS: 100.0000 mg | ORAL_TABLET | Freq: Every day | ORAL | Status: DC

## 2012-10-28 ENCOUNTER — Ambulatory Visit: Payer: Medicare Other | Admitting: Pulmonary Disease

## 2012-10-28 ENCOUNTER — Encounter (HOSPITAL_COMMUNITY): Payer: Self-pay

## 2012-10-28 ENCOUNTER — Emergency Department (HOSPITAL_COMMUNITY): Payer: Medicare Other

## 2012-10-28 ENCOUNTER — Emergency Department (HOSPITAL_COMMUNITY)
Admission: EM | Admit: 2012-10-28 | Discharge: 2012-10-28 | Disposition: A | Discharge status: Home / Self Care | Payer: Medicare Other | Attending: Emergency Medicine | Admitting: Emergency Medicine

## 2012-10-28 DIAGNOSIS — Z8719 Personal history of other diseases of the digestive system: Secondary | ICD-10-CM | POA: Insufficient documentation

## 2012-10-28 DIAGNOSIS — Z7982 Long term (current) use of aspirin: Secondary | ICD-10-CM | POA: Insufficient documentation

## 2012-10-28 DIAGNOSIS — M810 Age-related osteoporosis without current pathological fracture: Secondary | ICD-10-CM | POA: Insufficient documentation

## 2012-10-28 DIAGNOSIS — Z95810 Presence of automatic (implantable) cardiac defibrillator: Secondary | ICD-10-CM | POA: Diagnosis not present

## 2012-10-28 DIAGNOSIS — M109 Gout, unspecified: Secondary | ICD-10-CM | POA: Diagnosis not present

## 2012-10-28 DIAGNOSIS — Z860100 Personal history of colon polyps, unspecified: Secondary | ICD-10-CM | POA: Insufficient documentation

## 2012-10-28 DIAGNOSIS — R059 Cough, unspecified: Secondary | ICD-10-CM | POA: Diagnosis not present

## 2012-10-28 DIAGNOSIS — I739 Peripheral vascular disease, unspecified: Secondary | ICD-10-CM | POA: Insufficient documentation

## 2012-10-28 DIAGNOSIS — F17200 Nicotine dependence, unspecified, uncomplicated: Secondary | ICD-10-CM | POA: Insufficient documentation

## 2012-10-28 DIAGNOSIS — R0602 Shortness of breath: Secondary | ICD-10-CM | POA: Diagnosis not present

## 2012-10-28 DIAGNOSIS — I2510 Atherosclerotic heart disease of native coronary artery without angina pectoris: Secondary | ICD-10-CM | POA: Insufficient documentation

## 2012-10-28 DIAGNOSIS — Z872 Personal history of diseases of the skin and subcutaneous tissue: Secondary | ICD-10-CM | POA: Diagnosis not present

## 2012-10-28 DIAGNOSIS — Z8781 Personal history of (healed) traumatic fracture: Secondary | ICD-10-CM | POA: Insufficient documentation

## 2012-10-28 DIAGNOSIS — Z87311 Personal history of (healed) other pathological fracture: Secondary | ICD-10-CM | POA: Insufficient documentation

## 2012-10-28 DIAGNOSIS — J4489 Other specified chronic obstructive pulmonary disease: Secondary | ICD-10-CM | POA: Insufficient documentation

## 2012-10-28 DIAGNOSIS — J069 Acute upper respiratory infection, unspecified: Secondary | ICD-10-CM | POA: Insufficient documentation

## 2012-10-28 DIAGNOSIS — R509 Fever, unspecified: Secondary | ICD-10-CM | POA: Insufficient documentation

## 2012-10-28 DIAGNOSIS — J449 Chronic obstructive pulmonary disease, unspecified: Secondary | ICD-10-CM | POA: Diagnosis not present

## 2012-10-28 DIAGNOSIS — R0789 Other chest pain: Secondary | ICD-10-CM | POA: Diagnosis not present

## 2012-10-28 DIAGNOSIS — Z9861 Coronary angioplasty status: Secondary | ICD-10-CM | POA: Diagnosis not present

## 2012-10-28 DIAGNOSIS — Z8679 Personal history of other diseases of the circulatory system: Secondary | ICD-10-CM | POA: Insufficient documentation

## 2012-10-28 DIAGNOSIS — I10 Essential (primary) hypertension: Secondary | ICD-10-CM | POA: Diagnosis not present

## 2012-10-28 DIAGNOSIS — Z8739 Personal history of other diseases of the musculoskeletal system and connective tissue: Secondary | ICD-10-CM | POA: Diagnosis not present

## 2012-10-28 DIAGNOSIS — E7800 Pure hypercholesterolemia, unspecified: Secondary | ICD-10-CM | POA: Diagnosis not present

## 2012-10-28 DIAGNOSIS — R05 Cough: Secondary | ICD-10-CM | POA: Insufficient documentation

## 2012-10-28 LAB — CBC WITH DIFFERENTIAL/PLATELET
Basophils Absolute: 0 10*3/uL (ref 0.0–0.1)
Eosinophils Relative: 5 % (ref 0–5)
Lymphocytes Relative: 10 % — ABNORMAL LOW (ref 12–46)
Lymphs Abs: 1.1 10*3/uL (ref 0.7–4.0)
MCV: 94.6 fL (ref 78.0–100.0)
Neutro Abs: 7.8 10*3/uL — ABNORMAL HIGH (ref 1.7–7.7)
Neutrophils Relative %: 74 % (ref 43–77)
Platelets: 138 10*3/uL — ABNORMAL LOW (ref 150–400)
RBC: 3.92 MIL/uL — ABNORMAL LOW (ref 4.22–5.81)
RDW: 12.4 % (ref 11.5–15.5)
WBC: 10.4 10*3/uL (ref 4.0–10.5)

## 2012-10-28 LAB — BASIC METABOLIC PANEL
CO2: 22 mEq/L (ref 19–32)
Calcium: 9.2 mg/dL (ref 8.4–10.5)
GFR calc non Af Amer: 79 mL/min — ABNORMAL LOW (ref >90)
Potassium: 3.9 mEq/L (ref 3.5–5.1)
Sodium: 128 mEq/L — ABNORMAL LOW (ref 135–145)

## 2012-10-28 LAB — URINALYSIS, ROUTINE W REFLEX MICROSCOPIC
Glucose, UA: NEGATIVE mg/dL
Leukocytes, UA: NEGATIVE
Protein, ur: NEGATIVE mg/dL
Specific Gravity, Urine: 1.022 (ref 1.005–1.030)
Urobilinogen, UA: 2 mg/dL — ABNORMAL HIGH (ref 0.0–1.0)

## 2012-10-28 MED ORDER — SODIUM CHLORIDE 0.9 % IV BOLUS (SEPSIS)
1000.0000 mL | Freq: Once | INTRAVENOUS | Status: AC
  Administered 2012-10-28: 1000 mL via INTRAVENOUS

## 2012-10-28 MED ORDER — HYDROCODONE-HOMATROPINE 5-1.5 MG/5ML PO SYRP: 5.0000 mL | ORAL_SOLUTION | ORAL | Status: DC | PRN

## 2012-10-28 NOTE — ED Notes (Signed)
Pt unable to urinate at this time.  Gave pt urinal and instructed him to call out after he has used it.

## 2012-10-28 NOTE — ED Notes (Signed)
Patient transported to CT 

## 2012-10-28 NOTE — ED Provider Notes (Signed)
Medical screening examination/treatment/procedure(s) were conducted as a shared visit with non-physician practitioner(s) and myself.  I personally evaluated the patient during the encounter  7:00 AM Patient states she feels somewhat better after albuterol neb treatment and oxygen. He is not on oxygen at home. He is not wheezing at this time but does have distant sounds. We will observe patient and obtain basic lab tests. We will then contact Dr. Kriste Basque with whom he had an appointment at 9:00AM. At this time the patient is not sure he would be able to make it to that appointment.  Hanley Seamen, MD 10/28/12 [phone]

## 2012-10-28 NOTE — ED Notes (Signed)
Pt placed on cardiac monitoring/ pacemaker noted.

## 2012-10-28 NOTE — ED Notes (Signed)
Per EMS pt from home, pt reports SOB and productive cough x2 days, hx of COPD, pt received neb tx x1 en route. Inspiratory wheezing prior to neb tx. 20 g LH SL, VSS

## 2012-10-28 NOTE — ED Notes (Signed)
Pt reports chest and nasal congestion, productive cough w/yellow colored sputum x1 week, pt reports increase SOB starting last night. Pt reports pain only when he coughs. Pt has a hx of COPD, denies using at home O2 or chest pain

## 2012-10-28 NOTE — ED Provider Notes (Signed)
History     CSN: [id number]  Arrival date & time 10/28/12  0865   First MD Initiated Contact with Patient 10/28/12 [phone]      Chief Complaint  Patient presents with  . Shortness of Breath    (Consider location/radiation/quality/duration/timing/severity/associated sxs/prior treatment) The history is provided by the patient and medical records. No language interpreter was used.   [name] 82 is a 79 year old male with a past medical history significant for COPD, atherosclerotic heart disease.  He has presents today with chief complaint of shortness of breath.  Patient states he has had a productive cough subjective fever, shaking chills since Monday, 10/20/2012.  Patient states that he did get his flu shot this year. Patient states he has muscular pain in the chest due his coughing. He does not have home oxygen.  He is followed by Dr.Nadel in pulmonology.  Denies DOE, chest tightness, pain or pressure, radiation to left arm, jaw or back, or diaphoresis. Denies dysuria, flank pain, suprapubic pain, frequency, urgency, or hematuria. Denies headaches, light headedness, weakness, visual disturbances. Denies abdominal pain, nausea, vomiting, diarrhea or constipation.   Past Medical History  Diagnosis Date  . Allergic rhinitis   . COPD (chronic obstructive pulmonary disease)   . Tobacco abuse   . Hypertension   . Atherosclerotic heart disease   . Ischemic cardiomyopathy   . Syncope   . Presence of cardiac defibrillator   . Peripheral vascular disease   . Hypercholesterolemia   . Diverticulosis of colon   . History of colonic polyps   . Hemorrhoids   . Abnormal liver function tests   . DJD (degenerative joint disease)   . History of gout   . Back pain   . Compression fracture   . Osteoporosis   . Dermatitis     Past Surgical History  Procedure Date  . Abdominal aortic aneurysm repair 2002    by Dr. Hart [place]  . Cad stent 02/2002    Dr. Antoine Poche  . Aicd placed 03/2008    Dr. Graciela Husbands    . Right corotid enderectomy 12/2010    Dr. Edilia Bo  . Carotid endarterectomy 2010    Family History  Problem Relation Age of Onset  . Hypertension Father   . Heart disease Father     Heart Disease before age 19  . Hypertension Mother   . Heart disease Mother     Heart Disease before age 15    History  Substance Use Topics  . Smoking status: Current Every Day Smoker    Types: Cigars  . Smokeless tobacco: Never Used  . Alcohol Use: 1.8 oz/week    3 Glasses of wine per week      Review of Systems Ten systems reviewed and are negative for acute change, except as noted in the HPI.    Allergies  Review of patient's allergies indicates no known allergies.  Home Medications   Current Outpatient Rx  Name  Route  Sig  Dispense  Refill  . ALBUTEROL SULFATE HFA 108 (90 BASE) MCG/ACT IN AERS   Inhalation   Inhale 2 puffs into the lungs every 6 (six) hours as needed for wheezing.   8.5 each   0   . ALLOPURINOL 100 MG PO TABS   Oral   Take 1 tablet (100 mg total) by mouth daily.   30 tablet   3   . ASPIRIN 81 MG PO TABS   Oral   Take 81 mg by mouth daily.           Marland Kitchen  CARVEDILOL 3.125 MG PO TABS   Oral   Take 1 tablet (3.125 mg total) by mouth 2 (two) times daily.   60 tablet   11   . ERGOCALCIFEROL 50000 UNITS PO CAPS   Oral   Take 1 capsule (50,000 Units total) by mouth once a week.   12 capsule   3   . FENOFIBRATE 160 MG PO TABS      TAKE ONE TABLET BY MOUTH ONE TIME DAILY   30 tablet   6   . FIBERCHOICE PO   Oral   Take 2 tablets by mouth daily.          Marland Kitchen LISINOPRIL 5 MG PO TABS   Oral   Take 1 tablet (5 mg total) by mouth at bedtime.   30 tablet   11   . OCUVITE PO      1 tab twice a day         . SIMVASTATIN 40 MG PO TABS   Oral   Take 1 tablet (40 mg total) by mouth at bedtime.   90 tablet   3     BP 128/72  Pulse 84  Temp 99.2 F (37.3 C) (Oral)  Resp 21  SpO2 93%  Physical Exam  Nursing note and vitals  reviewed. Constitutional: He is oriented to person, place, and time.       Chronically ill appearing  HENT:  Head: Normocephalic and atraumatic.  Eyes: Conjunctivae normal and EOM are normal. Pupils are equal, round, and reactive to light. No scleral icterus.  Neck: Normal range of motion. Neck supple. No JVD present. No tracheal deviation present.  Cardiovascular: Normal rate, regular rhythm and normal heart sounds.   Pulmonary/Chest: Effort normal. No respiratory distress. He has no rales. He exhibits tenderness.       Patient with expiratory ronchi, clear with cough. No wheezes.  Abdominal: Soft. He exhibits no distension and no mass. There is no tenderness. There is no guarding.  Musculoskeletal: Normal range of motion. He exhibits no edema.  Lymphadenopathy:    He has no cervical adenopathy.  Neurological: He is alert and oriented to person, place, and time.  Skin: Skin is warm and dry. He is not diaphoretic.  Psychiatric: His behavior is normal.    ED Course  Procedures (including critical care time)  Labs Reviewed  CBC WITH DIFFERENTIAL - Abnormal; Notable for the following:    RBC 3.92 (*)     HCT 37.1 (*)     MCH 34.2 (*)     MCHC 36.1 (*)     Platelets 138 (*)     Neutro Abs 7.8 (*)     Lymphocytes Relative 10 (*)     Monocytes Absolute 1.1 (*)     All other components within normal limits  BASIC METABOLIC PANEL  URINALYSIS, ROUTINE W REFLEX MICROSCOPIC   Dg Chest 2 View  10/28/2012  *RADIOLOGY REPORT*  Clinical Data: Shortness of breath.  CHEST - 2 VIEW  Comparison: Plain films of the chest 02/09/2011 and 10/10/2010.  Findings: AICD is in place.  Lungs are clear.  Heart size is normal.  No pneumothorax or pleural fluid.  Remote compression fracture deformity at the thoracolumbar junction with methylmethacrylate in place again seen.  IMPRESSION: No acute abnormality.  Stable compared to prior exam.   Original Report Authenticated By: Holley Dexter, M.D.      No  diagnosis found.    MDM  7:58 AM Filed Vitals:  10/28/12 0531  BP: 128/72  Pulse: 84  Temp: 99.2 F (37.3 C)  TempSrc: Oral  Resp: 21  SpO2: 93%    Patient seen in shared visit with Dr. Read Drivers.  He has received a neb tx and is currently on 2 L O2 nasal cannula and feels much better awaiting labs  Filed Vitals:   10/28/12 0932 10/28/12 0934 10/28/12 0944 10/28/12 1043  BP: 125/65   134/67  Pulse: 88  87 86  Temp:  99.5 F (37.5 C)    TempSrc:  Oral    Resp: 22  24 19   SpO2: 95%  93% 94%   I have spokwn with the patient's pulmonologist Dr. Kriste Basque.   02 sats  at 94% on RA with ambulation. Negative CXR. Labs show hyponatremia/chloremia.  repleted with IV saline.  D/C patient with hycodan and mucinex. Encouraged fluid intake and rest.  At this time there does not appear to be any evidence of an acute emergency medical condition and the patient appears stable for discharge with appropriate outpatient follow up.Diagnosis was discussed with patient who verbalizes understanding and is agreeable to discharge. Pt case discussed with Dr. Kriste Basque who agrees with my plan.  His office staff will contact the patient with an appointment for follow up this week in his office.       Arthor Captain, PA-C 10/30/12 1200

## 2012-10-28 NOTE — ED Notes (Signed)
Pt o2 94% while walking without assist.

## 2012-10-28 NOTE — ED Notes (Signed)
YNW:[id number]> Expected date:<BR> Expected time:<BR> Means of arrival:<BR> Comments:<BR> 79  Yo male with fever and congestion-wheezing-albuterol neb-IV established

## 2012-10-30 ENCOUNTER — Encounter: Payer: Self-pay | Admitting: Pulmonary Disease

## 2012-10-30 ENCOUNTER — Ambulatory Visit (INDEPENDENT_AMBULATORY_CARE_PROVIDER_SITE_OTHER): Payer: Medicare Other | Admitting: Pulmonary Disease

## 2012-10-30 VITALS — BP 120/58 | HR 77 | Temp 98.8°F | Ht 71.0 in | Wt 168.4 lb

## 2012-10-30 DIAGNOSIS — I739 Peripheral vascular disease, unspecified: Secondary | ICD-10-CM

## 2012-10-30 DIAGNOSIS — I2589 Other forms of chronic ischemic heart disease: Secondary | ICD-10-CM

## 2012-10-30 DIAGNOSIS — T148XXA Other injury of unspecified body region, initial encounter: Secondary | ICD-10-CM

## 2012-10-30 DIAGNOSIS — I10 Essential (primary) hypertension: Secondary | ICD-10-CM | POA: Diagnosis not present

## 2012-10-30 DIAGNOSIS — M109 Gout, unspecified: Secondary | ICD-10-CM

## 2012-10-30 DIAGNOSIS — I2510 Atherosclerotic heart disease of native coronary artery without angina pectoris: Secondary | ICD-10-CM | POA: Diagnosis not present

## 2012-10-30 DIAGNOSIS — M1990 Unspecified osteoarthritis, unspecified site: Secondary | ICD-10-CM

## 2012-10-30 DIAGNOSIS — M549 Dorsalgia, unspecified: Secondary | ICD-10-CM

## 2012-10-30 DIAGNOSIS — E7800 Pure hypercholesterolemia, unspecified: Secondary | ICD-10-CM

## 2012-10-30 DIAGNOSIS — J441 Chronic obstructive pulmonary disease with (acute) exacerbation: Secondary | ICD-10-CM | POA: Insufficient documentation

## 2012-10-30 MED ORDER — PREDNISONE 20 MG PO TABS: ORAL_TABLET | ORAL | Status: DC

## 2012-10-30 MED ORDER — LEVOFLOXACIN 500 MG PO TABS: 500.0000 mg | ORAL_TABLET | Freq: Every day | ORAL | Status: DC

## 2012-10-30 MED ORDER — FLUTICASONE-SALMETEROL 250-50 MCG/DOSE IN AEPB: 1.0000 | INHALATION_SPRAY | Freq: Two times a day (BID) | RESPIRATORY_TRACT | Status: DC

## 2012-10-30 MED ORDER — METHYLPREDNISOLONE ACETATE 80 MG/ML IJ SUSP
80.0000 mg | Freq: Once | INTRAMUSCULAR | Status: AC
  Administered 2012-10-30: 80 mg via INTRAMUSCULAR

## 2012-10-30 NOTE — Progress Notes (Signed)
Subjective:    Patient ID: [name], male    DOB: 1931-09-24, 79 y.o.   MRN: [id number]  HPI 79 y/o WM here for a follow up visit... he has been followed closely by DrHochrein & DrKlein during the past years due to his cardiomyopathy & AICD...   ~  October 10, 2010:  Yearly ROV- doing well, he says w/o new complaints or concerns... states he had recent URI but toughed it out & better now... he saw DrKlein 6/11- f/u ischemic heart dis w/ decr LVF & AICD> noted some postural changes, hx syncope, on-going smoking, AICD was OK & no changes made...  he saw DrHochrein 8/11- cardiomyopathy, EF 40-45% on last 2DEcho, syncopal epis related to postural BP changes & he decreased his meds (Coreg3.125Bid & Lisin5Bid) & no prob since then...  he had CDopplers Q70mo w/ 60-79% bilat ICAstenoses (mod to severe mixed irreg plaque) on ASA daily & no cerebral ischemic symptoms...  he's had some on-going LBP & XRay 8/11 w/o acute changes (prev T12 augmentation, NAD).Marland Kitchen. up to date on his vaccinations and doesn't need refill perscriptions today.  ~  February 16, 2011:  [name] had a 53mo f/u CDoppler 2/12 w/ worsening RICA velocities c/w 80-99% stenosis (stable 60-79% LICA stenosis);  He was evaluated by [facility] DrDickson & underwent a right CAE w/ DPA 3/12 without complications;  He has been doing satis post-op on his ASA 81mg /d & they plan f/u CDoppler in 53mo intervals going forward...      On 02/09/11 he was bringing groceries into the house when his legs gave way & he fell backwards striking his occiput & right side on the ground;  ER eval revealed signif contusions, ?right rib fxs, hematoma on occiput, etc;  Work up included CXR, Lumbar films, CT Brain & CSpine> see results below;  Not adm & Rx as outpt w/ rest, heat, Vicodin, etc...  Family called w/ concern for his pain level, not eating or drinking enough, incr wheezing & SOB> we called in Pred dosepak, Proair inhaler, Mucinex 2Bid w/ fluids;  They report much improved on this  rx> eating better, more mobile, still w/ pain, bruise right flank, tender, etc; and they note weak, weak voice, some reflux symptoms, hard to swallow Tricor capsule, etc...    We discussed finishing out the Pred, continue IS (for deep breathing), Proair, Mucinex, rib binder, heating pad, Protonix, GI eval for swallowing by DrPatterson, lax meds as needed...  ~  May 30,2012:  [name] did not take the Protonix & didn't see GI as he felt that his reflux improved on its own & swallowing is OK now; he also feels that his voice has improved back to baseline; he has resumed some smoking (cigars) & Etoh according to his daughter who accompanies him today;  He has remained ?dizzy w/ postural BP changes and "passing out" he says related to his BP meds> prev on Lisinopril 5mg Bid & Coreg 3.125mg Bid- he decr to just one of each Qam in mid-May after talking to DrHochrein, but still had the afternoon symptoms so he stopped the meds completely one week ago;  BP today shows prominent postural BP changes w/ BP 130/70 supine & 100/60 sitting & standing...    Review of the record shows he last saw DrHochrein 8/11 w/ similar symptoms- syncopal spell at home, believed due to transient low BP, Lisinopril & Coreg adjusted at that time for his Cardiomyopathy> last 2DEcho was 4/10 showing mild LVH, mod reduced LVF w/ EF=40-45%  w/ inferobasal & post HK, mild MR, paradoxical septal motion;  We reviewed meds, Labs/ XRays/ ER note from 4/12...  Asked to leave the Lisinopril & Coreg off for now, stop the smoking & Etoh, monitor BP when sitting/ standing, follow up w/ Cards ASAP...  ~  May 22, 2011:  24mo ROV & he saw DrHochrein 5/12 & DrKlein 7/12 for f/u of his Ischemic heart dis & depressed LVF w/ EF=40-45% on 2DEcho 4/11, w/ ICD implant for primary prevention (no discharges- device doing well); he has been off his Coreg & Lisinopril due to Syncope felt related to postural hypotension (now using support hose) but DrKlein wanted him back on  these meds starting w/ low doses> he had f/u 2DEcho 7/12 showing mild LVH, EF~35-40%, Gr 1 DD, & rec to restart meds per DrKlein; pt declined Cardiac Rehab.    He tells me that he has quit Etoh & feeling better> BP 140/70, improved, no postural changes & prev dizziness resolved;  he is back on LISINOPRIL 5mg  Qhs;  We reviewed prev labs & reminded of need for f/u FLP...  ~  October 10, 2011:  27mo ROV & he is improved overall (he credits quitting Etoh), notes some itching w/ eval by Derm (given cream for itching), otherw no new complaints or concerns... COPD> he has Proair for prn use; doing better w/ cut back on cigar consumption... HBP> BP= 128/74 today on Coreg3.125Bid & Lisinopril5; denies CP, palpit, dizzy/syncope, ch in SOB/DOE, edema... ASHD/ Cardiomyopathy> improved w/ BBlocker/ ACE rx, not requiring diuretic; he saw DrHochrein 11/12> doing better, no changes made. Periph Vasc Dis> AAA repair 2000 by [facility], still too sedentary & he declined cardiac rehab; s/p R CAE w/ DPA 3/12 by DrCDickson; f/u CDoppler by [facility] 9/12 showed patent rightCAE site & 40-59% left ICA stenosis; they plan regular follow ups... CHOL> on Simva40 + fenofib160 & FLP looks good today (see below)... GI> GERD, Divertics, Polyps,etc>  GI reported stable on incr Fiber intake... DJD, osteoporosis, compression fx>  Vit D level is low at 17 & rec to take Calcium, Men's formula MVI, Vit D OTC 5000u daily... DERM> he saw Derm re: his rash (?seb dermatitis) and itching; labs show 22% eos & rec to take Antihist eg Buel Ream, Zyrkek daily...  ~  April 09, 2012:  57mo ROV & [name] says he is doing fine- no new complaints or concerns; he has a dry skin dermatitis & has seen Jeronimo Norma; He never did start the Vit D supplement after our last OV & today we wrote for 50K weekly Rx... COPD> he has Proair for prn use; doing better w/ cut back on cigar consumption... HBP> on Coreg3.125Bid & Lisinopril5; BP= 122/68 & denies CP, palpit,  dizzy/syncope, ch in SOB/DOE, edema... ASHD/ Cardiomyopathy> on ASA 81mg /d; improved w/ BBlocker/ ACE rx, not requiring diuretic and denies CP/ angina, etc... Periph Vasc Dis> AAA repair 2000 by [facility], still too sedentary & he declined cardiac rehab; s/p R CAE w/ DPA 3/12 by DrCDickson; f/u CDoppler by [facility] 9/12 showed patent rightCAE site & 40-59% left ICA stenosis; they plan regular follow ups... CHOL> on Simva40 + Fenofib160; FLP 12/12 looked ok- continue same... GI> GERD, Divertics, Polyps,etc>  GI reported stable on incr Fiber intake... DJD, osteoporosis, compression fx>  Vit D level is low at 17 & rec to take Calcium, Men's formula MVI, & Rx written for VitD 50K weekly... DERM> he saw Derm re: his rash (?seb dermatitis) and itching; labs show 22%  eos & rec to take Antihist eg Buel Ream, Zyrkek daily...    We reviewed prob list, meds, xrays and labs> see below>>  ~  October 30, 2012:  64mo ROV & [name] presents w/ a 2wk hx URI starting w/ low grade temp, chills, & feeling cold/ feeling bad; then developed cough w/ thick yellow phlegm and chest congestion- no hemoptysis, no CP x sore from coughing; he tried OTC meds w/o benefit & progressed w/ incr SOB prompting him to call EMS & go to the ER 1/7;  ER eval reviewed: congested w/ rhonchi on exam; T99.2 RR=21 O2sat=93%; CXR was clear & CBC- ok w/ WBC=10.4; treated w/ O2, NEB, IV fluid and improved...     Today his exam shows mod cough, exp wheezing & rhonchi, no consolidation, thick yellow sput, etc; We discussed Acute on Chronic COPD and treatment w/ Levaquin, Depo80, Prednisone taper, Advair250, Mucinex w/ Fluids, Hycodan as needed etc... We will plan ROV recheck in 1 mo.    We reviewed prob list, meds, xrays and labs> see below for updates >>          Problem List:  COPD (ICD-496) - despite all efforts [name] continues to smoke 3-4 cigars per day... he has min cough, some phlegm, but denies CP, SOB, wheezing, etc...  He doesn't want Chantix  or help w/ smoking cessation;  He has a PROAIR inhaler for Prn use but he seldom uses it, & he knows to use the OTC MUCINEX 1-2 Bid w/ fluids for congestion. ~  CXR 3/12 in hosp for CAE showed COPD/E, biapical pleuroparenchymal scarring, NAD, AICD on left, old left rib fx, old T12 vertebroplasty... ~  Fall at home 4/12 w/ signif trauma & prob right rib fxs (CXR in ER showed some atelec but NAD & no rib films done). ~  1/14: presents w/ URI & acute on chr COPD exac; CXR in ER showed norm heart size, clear lungs, AICD in place, compression T12 w/ augmentation.  HYPERTENSION (ICD-401.9) - back on COREG 3.125Bid & LISINOPRIL 5mg Qhs... Prev had to wean off these due to dizziness & post hypotension (resolved off Etoh). ~  12/11:  BP= 126/70 and doing well> denies HA, fatigue, visual changes, CP, palipit, dizziness, dyspnea, edema, etc; he has had postural hypotension & several syncopal episodes related to this- now improved w/ the final adjustment in his meds. ~  4/12:  BP= 90/58 ==>100/60 recheck & he is weak; asked to monitor BP at home & may need to decr meds. ~  5/12:  BP= 130/70 supine & 100/60 sitting & standing (no symptoms at present)> he has been off the Lisinopril & Coreg for 1wk now. ~  7/12:  BP= 120/72 & 140/70 w/o postural changes (improved off etoh); he is back on Lisinopril 5mg  Qhs per Cards. ~  12/12:  BP= 128/74 on Coreg3.125Bid & Lisinopril5; denies CP, palpit, dizzy/syncope, ch in SOB/DOE, edema... ~  6/13:  BP= 122/68 & he remains largely asymptomatic... ~  1/14:  BP= 120/58 & he is here w/ acute on chr COPD exac; denies CP, palpit, etc...  ATHEROSCLEROTIC HEART DISEASE (ICD-414.00) - on ASA 81mg /d + above meds... ISCHEMIC CARDIOMYOPATHY (ICD-414.8) Hx of SYNCOPE (ICD-780.2) & IMPLANTABLE DEFIBRILLATOR, DDD MDT (ICD-V45.02) ~  Cath in 2003 w/ 2 vessel CAD and stent placed in LAD...  ~  Cardiolite 1/08 w/ large inferolat infarct, no ischemia, EF=36%...  ~  2DEcho 2/08 w/ infer & post  HK, EF=40%...  ~  recath 6/09 w/  heavily calcif vessels & 40% EF- DrHochrein has been following carefully and adjusting meds. ~  AICD placed 2009 by DrKlein for hx syncope & ischemic cardiomyopathy- followed by DrKlein yearly & doing satis. ~  2DEcho 4/10 showed mild LVH, mod reduced LVF w/ EF=40-45% w/ inferobasal & post HK, mild MR, paradoxical septal motion. ~  9/10: Cards tried to incr the Coreg to 9.375mg Bid but pt intol & went back to 6.25Bid. ~  8/11:  f/u by DrHochrein w/ recent syncopal episode & meds adjusted Coreg 3.125Bid & Lisinopril 5Bid. ~  Serial XRays have all revealed signif atherosclerotic changes diffusely (eg- lumbar films 4/12, CT neck 4/12 as well). ~  4/12> ER visit for fall at home w/ signif trauma> ?post BP related, ?med related, ?other etiology > Cards eval & weaned off Coreg/ Lisinopril w/ resolution of postural hypotension. ~  7/12:  DrKlein restarted Lisin 5mg Qhs, BP improved, no further postural changes, f/u 2DEcho w/ EF=35-40% & Gr1DD... ~  12/12:  DrHochrein has him back on Coreg3.125Bid & Lisinopril5/d & stable overall... ~  7/13: he saw DrKlein w/ interrogation of his AICD showing some AFib episodes; he is considering anticoagulation but held off after discussion w/ DrHochrein...  CEREBROVASCULAR DISEASE PERIPHERAL VASCULAR DISEASE (ICD-443.9) - on ASA 81mg /d...  ~  He is s/p AAA repair 2000 by [facility]... he is sedentary and hasn't had ABI's checked... I will leave this to DrHochrein... ~  CDopplers 7/10, 1/1,1 & 8/11 showed stable mod carotid dis bilat w/ heavy calcif plaque & 60-79% bilat ICA stenoses... ~  CDopplers 2/12 w/ worsening RICA velocities c/w 80-99% stenosis (stable 60-79% LICA stenosis);  He was evaluated by [facility] DrDickson & underwent a right CAE w/ DPA 3/12 without complications;  He has been doing satis post-op & they plan f/u CDoppler in 47mo intervals going forward... ~  NOTE:  CT Brain 4/12 in ER showed mild to mod cortical vol loss & cbll  atrophy, sm vessel dis, no acute changes... Prom vasc calcif seen on neck films & in abd... ~  CDopplers 4/13 by drDickson showed patent right CAE sitew/ mild plaque; 40-59% left ICA stenosis felt to be stable...  HYPERCHOLESTEROLEMIA (ICD-272.0) - on SIMVASTATIN 40mg /d & FENOFIBRATE 160/d. ~  FLP 4/08 showed TChol 131, TG 100, HDL 41, LDL 70 ~  FLP 2/09 showed TChol 124, TG 132, HDL 37, LDL 61 ~  FLP 12/10 > pt never ret for FLP & insurance changed [employer] to [facility]. ~  FLP 12/11 showed TChol 112, TG 51, HDL 43, LDL 59 ~  4/12:  They report some difficult swallowing the Tricor capsule==> referred to GI for swallowing eval & switched to FENOFIBRATE 160mg /d. ~  FLP 12/12 on Simva40+Feno160 showed TChol 124, TG 78, HDL 37, LDL 72  GERD/ DYSPHAGIA> ~  4/12: pt noted some reflux symptoms & mild dysphagia for large capsule; Protonix 40mg /d started & refer to GI for eval; they also note weak voice & may need ENT eval if not resolved in follow up... ~  5/12:  Pt states all symptoms resolved on their own, didn't take PPI, didn't see GI, denies swallowing or voice issues now.  DIVERTICULOSIS OF COLON (ICD-562.10) > he takes fiber supplement daily. COLONIC POLYPS (ICD-211.3) HEMORRHOIDS (ICD-455.6) - last colonoscopy was 11/06 by DrPatterson showing divertics, several 1-75mm polyps (hyperplastic), and hems...  Hx of LIVER FUNCTION TESTS, ABNORMAL (ICD-794.8) - improved off etoh... ~  labs 2/09 showed SGOT= 30, SGPT= 17 ~  labs 12/11 showed SGOT= 38, SGPT=  19 ~  Labs 12/12 off all etoh & LFTs all wnl...  DEGENERATIVE JOINT DISEASE (ICD-715.90) Hx of GOUT (ICD-274.9) - on ALLOPURINOL 100mg /d... ~  labs 2/09 showed Uric= 3.3  OSTEOPOROSIS (ICD-733.00) - supposed to be on Caltrate, MVI, Vit D... he had T12 compression after syncopal spell 2009 w/ vertebroplasty by DrDeveshwar...  BMD here 10/09 showed TScores +0.3 in Spine, & -2.7 in right FemNeck (Ortho eval by Janett Billow)... ~  labs 12/11 showed Vit  D level = 22... rec to take Men's MVI + Vit D 2000 u daily. ~  Labs 12/12 showed Vit D level = 17; REC> start regular dosing of Calcium, Men's MVI, Vit D 5000u daily... ~  6/13: he never got the OTC Vit D so we will Rx w/ VitD 50K weekly Rx now...  DERMATITIS (ICD-692.9) - he has eosinophilia & saw Derm w/ rx for topical cream;  rec to take antihist as well...  Health Maintenance -  ~  GI: colonoscopy 11/06 w/ several hyperplastic polyps removed... ~  GU: PSA 12/12 = 1.50 ~  Immunizations:  he tells me that he had PNEUMOVAX & 2010 Flu shot 10/10... received TETANUS shot here 2003...   Past Surgical History  Procedure Date  . Abdominal aortic aneurysm repair 2002    by Dr. Hart [place]  . Cad stent 02/2002    Dr. Antoine Poche  . Aicd placed 03/2008    Dr. Graciela Husbands  . Right corotid enderectomy 12/2010    Dr. Edilia Bo  . Carotid endarterectomy 2010    Outpatient Encounter Prescriptions as of 10/30/2012  Medication Sig Dispense Refill  . albuterol (PROAIR HFA) 108 (90 BASE) MCG/ACT inhaler Inhale 2 puffs into the lungs every 6 (six) hours as needed for wheezing.  8.5 each  0  . allopurinol (ZYLOPRIM) 100 MG tablet Take 1 tablet (100 mg total) by mouth daily.  30 tablet  3  . aspirin 81 MG tablet Take 81 mg by mouth every morning.       . carvedilol (COREG) 3.125 MG tablet Take 3.125 mg by mouth 2 (two) times daily with a meal.      . fenofibrate 160 MG tablet Take 160 mg by mouth every morning.      Marland Kitchen HYDROcodone-homatropine (HYCODAN) 5-1.5 MG/5ML syrup Take 5 mLs by mouth every 4 (four) hours as needed for cough.  120 mL  0  . Inulin (FIBERCHOICE PO) Take 2 tablets by mouth daily.       Marland Kitchen lisinopril (PRINIVIL,ZESTRIL) 5 MG tablet Take 1 tablet (5 mg total) by mouth at bedtime.  30 tablet  11  . simvastatin (ZOCOR) 40 MG tablet Take 1 tablet (40 mg total) by mouth at bedtime.  90 tablet  3  . ergocalciferol (VITAMIN D2) 50000 UNITS capsule Take 50,000 Units by mouth once a week. On saturdays         No Known Allergies   Current Medications, Allergies, Past Medical History, Past Surgical History, Family History, and Social History were reviewed in [employer] record.    Review of Systems         See HPI - all other systems neg except as noted... The patient complains of dyspnea on exertion, prolonged cough, and difficulty walking.  The patient denies anorexia, fever, weight loss, weight gain, vision loss, decreased hearing, hoarseness, chest pain, syncope, peripheral edema, headaches, hemoptysis, abdominal pain, melena, hematochezia, severe indigestion/heartburn, hematuria, incontinence, muscle weakness, suspicious skin lesions, transient blindness, depression, unusual weight change, abnormal bleeding, enlarged  lymph nodes, and angioedema.     Objective:   Physical Exam      WD, WN, 79 y/o WM > chr ill appearing but NAD... GENERAL:  Alert & oriented; pleasant & cooperative... HEENT:  [place]/AT, EOM-full, PERRLA, EACs-clear  NOSE-clear, THROAT-clear & wnl, Voice sounds back to norm NECK:  Supple w/ fairROM; no JVD; prominent carotid impulses, scar on right, + bruits; no thyromegaly or nodules palpated; no lymphadenopathy... CHEST:  bilat rhonchi at bases & end-exp wheezing,  no rales, no signs of consolidation... HEART:  Regular Rhythm; gr 1/6 SEM, S4, no rubs... ABDOMEN:  Min Bruise in right flank, mild tender lower ribs; normal bowel sounds; no organomegaly or masses detected. EXT: without deformities, mild arthritic changes; no varicose veins/ +venous insuffic/ no edema. NEURO: no focal neuro deficits... DERM:  dry skin dermatitis, seborrhea, rosacea...  RADIOLOGY DATA:  Reviewed in the EPIC EMR & discussed w/ the patient...  LABORATORY DATA:  Reviewed in the EPIC EMR & discussed w/ the patient...   Assessment & Plan:    COPD>  With acute exac & dyspnea> we discussed (again) the need to quit smoking completely; Rx this exac w/ Levaquin, Depo, Pred 4d  taper, Advair250, Mucinex, Fluids, Align, etc... Advised to stay on the Advair Bid.  Hx HBP & Hx Postural Hypotension>  BP normal now & tolerating Coreg 3.125Bid + Lisinopril 5mg  Qhs; no postural BP changes noted today & his dizziness has resolved; he notes all symptoms improved off etoh...  ASHD/ Cardiomyop/ AICD>  Followed by DHochrein & Graciela Husbands;  Continue current meds & f/u by Cards.  Periph Vasc Dis>  Followed by [facility], DrDickson & CDoppler 9/12 showed patent right CAE site w/ 40-59% left ICA stenosis & they are following...  GI/ Dysphagia>  He notes symptoms resolved spont & he does not believe that he has a problem in this area, declines PPI Rx or GI eval...  FALL 4/12 w/ signif trauma>  Hx of trauma to right chest wall/ flank/ etc... Improved w/ rest, heat, rib binder, pain meds, IS, Proair, Mucinex, & Pred... More mobile & feeling better; He is off the prev Vicodin etc;  Note compression fx, osteopenia, low vit d level==> he needs as a minimum Calcium/ Men's MVI/ Vit D 5000u daily==> start Vit D 50K weekly now...  Other medical issues as noted...   Patient's Medications  New Prescriptions   FLUTICASONE-SALMETEROL (ADVAIR DISKUS) 250-50 MCG/DOSE AEPB    Inhale 1 puff into the lungs 2 (two) times daily.   LEVOFLOXACIN (LEVAQUIN) 500 MG TABLET    Take 1 tablet (500 mg total) by mouth daily.   PREDNISONE (DELTASONE) 20 MG TABLET    Take as directed per taper schedule  Previous Medications   ALBUTEROL (PROAIR HFA) 108 (90 BASE) MCG/ACT INHALER    Inhale 2 puffs into the lungs every 6 (six) hours as needed for wheezing.   ALLOPURINOL (ZYLOPRIM) 100 MG TABLET    Take 1 tablet (100 mg total) by mouth daily.   ASPIRIN 81 MG TABLET    Take 81 mg by mouth every morning.    CARVEDILOL (COREG) 3.125 MG TABLET    Take 3.125 mg by mouth 2 (two) times daily with a meal.   ERGOCALCIFEROL (VITAMIN D2) 50000 UNITS CAPSULE    Take 50,000 Units by mouth once a week. On saturdays   FENOFIBRATE 160 MG TABLET     Take 160 mg by mouth every morning.   HYDROCODONE-HOMATROPINE (HYCODAN) 5-1.5 MG/5ML SYRUP  Take 5 mLs by mouth every 4 (four) hours as needed for cough.   INULIN (FIBERCHOICE PO)    Take 2 tablets by mouth daily.    LISINOPRIL (PRINIVIL,ZESTRIL) 5 MG TABLET    Take 1 tablet (5 mg total) by mouth at bedtime.   SIMVASTATIN (ZOCOR) 40 MG TABLET    Take 1 tablet (40 mg total) by mouth at bedtime.  Modified Medications   No medications on file  Discontinued Medications   No medications on file

## 2012-10-30 NOTE — Patient Instructions (Addendum)
Today we updated your med list in our EPIC system...    Continue your current medications the same...  For your acute obstructive bronchitis we need to approach the differing mechanisms that have caused your shortness of breath>>  1>> Bronchial infection: Take the LEVAQUIN 500mg - one tab daily x10d...  2>> Bronchial inflammation: a) We gave you a Depo shot today;  b) Take the PREDNISONE 20mg  in a slow tapering schedule- start w/ one tab twice daily for 4d, then one tab daily for 4d, then 1/2 tab daily for 4d, then 1/2 tab every other day til gone;  c) Start the [employer] 250- one inhalation twice daily regularly every day & stay on this to keep the inflammation under control...  3>> Bronchial mucous plugging: It is very important to treat this mechanism- take the OTC MUCINEX 600mg  tabs- 2 tabs twice daily w/ lots of fluids (this will make it easier to expectorate the phlegm)...  Let's plan a brief follow up visit to see how you are doing in about 1 month...  Call for any questions.Marland KitchenMarland Kitchen

## 2012-11-10 ENCOUNTER — Ambulatory Visit (INDEPENDENT_AMBULATORY_CARE_PROVIDER_SITE_OTHER): Payer: Medicare Other | Admitting: *Deleted

## 2012-11-10 ENCOUNTER — Encounter: Payer: Self-pay | Admitting: Internal Medicine

## 2012-11-10 DIAGNOSIS — I2589 Other forms of chronic ischemic heart disease: Secondary | ICD-10-CM | POA: Diagnosis not present

## 2012-11-10 DIAGNOSIS — Z95810 Presence of automatic (implantable) cardiac defibrillator: Secondary | ICD-10-CM | POA: Diagnosis not present

## 2012-11-12 ENCOUNTER — Other Ambulatory Visit: Payer: Self-pay

## 2012-11-12 LAB — REMOTE ICD DEVICE
AL AMPLITUDE: 2.9 mv
AL IMPEDENCE ICD: 494 Ohm
BAMS-0001: 170 {beats}/min
BATTERY VOLTAGE: 3.0102 V
CHARGE TIME: 8.948 s
RV LEAD AMPLITUDE: 3.9 mv
TOT-0002: 0
TOT-0006: 20090623000000
TZAT-0012FASTVT: 200 ms
TZAT-0012SLOWVT: 200 ms
TZAT-0018FASTVT: NEGATIVE
TZAT-0019FASTVT: 8 V
TZAT-0020FASTVT: 1.5 ms
TZAT-0020SLOWVT: 1.5 ms
TZON-0003SLOWVT: 360 ms
TZST-0001FASTVT: 2
TZST-0001FASTVT: 4
TZST-0001FASTVT: 6
TZST-0001SLOWVT: 4
TZST-0001SLOWVT: 5
TZST-0002FASTVT: NEGATIVE
TZST-0002FASTVT: NEGATIVE
TZST-0002SLOWVT: NEGATIVE
TZST-0002SLOWVT: NEGATIVE
VF: 0

## 2012-11-12 MED ORDER — CARVEDILOL 3.125 MG PO TABS: 3.1250 mg | ORAL_TABLET | Freq: Two times a day (BID) | ORAL | Status: DC

## 2012-11-12 NOTE — Telephone Encounter (Signed)
..   Requested Prescriptions   Signed Prescriptions Disp Refills  . carvedilol (COREG) 3.125 MG tablet 30 tablet 6    Sig: Take 1 tablet (3.125 mg total) by mouth 2 (two) times daily with a meal.    Authorizing Provider: [name]    Ordering User: Christella Hartigan, Mckynzie Liwanag Judie Petit

## 2012-11-18 ENCOUNTER — Encounter: Payer: Self-pay | Admitting: *Deleted

## 2012-12-01 ENCOUNTER — Other Ambulatory Visit: Payer: Self-pay | Admitting: Pulmonary Disease

## 2012-12-09 ENCOUNTER — Ambulatory Visit (INDEPENDENT_AMBULATORY_CARE_PROVIDER_SITE_OTHER): Payer: Medicare Other | Admitting: Pulmonary Disease

## 2012-12-09 ENCOUNTER — Encounter: Payer: Self-pay | Admitting: Pulmonary Disease

## 2012-12-09 VITALS — BP 120/74 | HR 62 | Temp 98.1°F | Ht 71.0 in | Wt 168.6 lb

## 2012-12-09 DIAGNOSIS — J441 Chronic obstructive pulmonary disease with (acute) exacerbation: Secondary | ICD-10-CM

## 2012-12-09 DIAGNOSIS — L259 Unspecified contact dermatitis, unspecified cause: Secondary | ICD-10-CM

## 2012-12-09 DIAGNOSIS — I6529 Occlusion and stenosis of unspecified carotid artery: Secondary | ICD-10-CM

## 2012-12-09 DIAGNOSIS — M549 Dorsalgia, unspecified: Secondary | ICD-10-CM

## 2012-12-09 DIAGNOSIS — I10 Essential (primary) hypertension: Secondary | ICD-10-CM | POA: Diagnosis not present

## 2012-12-09 DIAGNOSIS — M1990 Unspecified osteoarthritis, unspecified site: Secondary | ICD-10-CM

## 2012-12-09 DIAGNOSIS — I2510 Atherosclerotic heart disease of native coronary artery without angina pectoris: Secondary | ICD-10-CM | POA: Diagnosis not present

## 2012-12-09 DIAGNOSIS — T148XXA Other injury of unspecified body region, initial encounter: Secondary | ICD-10-CM

## 2012-12-09 DIAGNOSIS — E7800 Pure hypercholesterolemia, unspecified: Secondary | ICD-10-CM

## 2012-12-09 DIAGNOSIS — I2589 Other forms of chronic ischemic heart disease: Secondary | ICD-10-CM

## 2012-12-09 DIAGNOSIS — I4891 Unspecified atrial fibrillation: Secondary | ICD-10-CM

## 2012-12-09 DIAGNOSIS — K5730 Diverticulosis of large intestine without perforation or abscess without bleeding: Secondary | ICD-10-CM

## 2012-12-09 DIAGNOSIS — D126 Benign neoplasm of colon, unspecified: Secondary | ICD-10-CM

## 2012-12-09 NOTE — Patient Instructions (Addendum)
Today we updated your med list in our EPIC system...    Continue your current medications the same...    Please stay on the ADVAIR 250- one inhalation twice daily...  Call for any problems...  Let's plan a general medical follow up in 3-35mo w/ FASTING blood work at that time.Marland KitchenMarland Kitchen

## 2012-12-09 NOTE — Progress Notes (Signed)
Subjective:    Patient ID: [name], male    DOB: June 12, 1931, 79 y.o.   MRN: [id number]  HPI 79 y/o WM here for a follow up visit... he has been followed closely by DrHochrein & DrKlein during the past years due to his cardiomyopathy & AICD...   ~  SEE PREV EPIC NOTES FOR THE EARLIER DATA >>  ~  October 10, 2011:  104mo ROV & he is improved overall (he credits quitting Etoh), notes some itching w/ eval by Derm (given cream for itching), otherw no new complaints or concerns... COPD> he has Proair for prn use; doing better w/ cut back on cigar consumption... HBP> BP= 128/74 today on Coreg3.125Bid & Lisinopril5; denies CP, palpit, dizzy/syncope, ch in SOB/DOE, edema... ASHD/ Cardiomyopathy> improved w/ BBlocker/ ACE rx, not requiring diuretic; he saw DrHochrein 11/12> doing better, no changes made. Periph Vasc Dis> AAA repair 2000 by [facility], still too sedentary & he declined cardiac rehab; s/p R CAE w/ DPA 3/12 by DrCDickson; f/u CDoppler by [facility] 9/12 showed patent rightCAE site & 40-59% left ICA stenosis; they plan regular follow ups... CHOL> on Simva40 + fenofib160 & FLP looks good today (see below)... GI> GERD, Divertics, Polyps,etc>  GI reported stable on incr Fiber intake... DJD, osteoporosis, compression fx>  Vit D level is low at 17 & rec to take Calcium, Men's formula MVI, Vit D OTC 5000u daily... DERM> he saw Derm re: his rash (?seb dermatitis) and itching; labs show 22% eos & rec to take Antihist eg Buel Ream, Zyrkek daily...  ~  April 09, 2012:  40mo ROV & Camila says he is doing fine- no new complaints or concerns; he has a dry skin dermatitis & has seen Jeronimo Norma; He never did start the Vit D supplement after our last OV & today we wrote for 50K weekly Rx... COPD> he has Proair for prn use; doing better w/ cut back on cigar consumption... HBP> on Coreg3.125Bid & Lisinopril5; BP= 122/68 & denies CP, palpit, dizzy/syncope, ch in SOB/DOE, edema... ASHD/ Cardiomyopathy> on ASA  81mg /d; improved w/ BBlocker/ ACE rx, not requiring diuretic and denies CP/ angina, etc... Periph Vasc Dis> AAA repair 2000 by [facility], still too sedentary & he declined cardiac rehab; s/p R CAE w/ DPA 3/12 by DrCDickson; f/u CDoppler by [facility] 9/12 showed patent rightCAE site & 40-59% left ICA stenosis; they plan regular follow ups... CHOL> on Simva40 + Fenofib160; FLP 12/12 looked ok- continue same... GI> GERD, Divertics, Polyps,etc>  GI reported stable on incr Fiber intake... DJD, osteoporosis, compression fx>  Vit D level is low at 17 & rec to take Calcium, Men's formula MVI, & Rx written for VitD 50K weekly... DERM> he saw Derm re: his rash (?seb dermatitis) and itching; labs show 22% eos & rec to take Antihist eg Buel Ream, Zyrkek daily...    We reviewed prob list, meds, xrays and labs> see below>>  ~  October 30, 2012:  40mo ROV & [name] presents w/ a 2wk hx URI starting w/ low grade temp, chills, & feeling cold/ feeling bad; then developed cough w/ thick yellow phlegm and chest congestion- no hemoptysis, no CP x sore from coughing; he tried OTC meds w/o benefit & progressed w/ incr SOB prompting him to call EMS & go to the ER 1/7;  ER eval reviewed: congested w/ rhonchi on exam; T99.2 RR=21 O2sat=93%; CXR was clear & CBC- ok w/ WBC=10.4; treated w/ O2, NEB, IV fluid and improved...     Today  his exam shows mod cough, exp wheezing & rhonchi, no consolidation, thick yellow sput, etc; We discussed Acute on Chronic COPD and treatment w/ Levaquin, Depo80, Prednisone taper, Advair250, Mucinex w/ Fluids, Hycodan as needed etc... We will plan ROV recheck in 1 mo.    We reviewed prob list, meds, xrays and labs> see below for updates >>   ~  December 09, 2012:  6wk ROV & recheck> [name] responded nicely to our Rx w/ Pred, Levaquin, Advair250, Mucinex, etc;  He has since stopped all the meds including the Advair- we discussed this & the reason for the long term use of the Advair250- one inhalation Bid & he  agrees to continue; plus the Mucinex prn for thick phlegm & Proair as needed...    We reviewed his other medical issues today- BP controlled on meds; Cardiac stable & he gets pacer checks w/ DrKlein; Cerebrovasc f/u DrDickson yearly for Dopplers due this spring; needs to ret fasting for labs...         Problem List:  COPD (ICD-496) - despite all efforts [name] continues to smoke 3-4 cigars per day... he has min cough, some phlegm, but denies CP, SOB, wheezing, etc...  He doesn't want Chantix or help w/ smoking cessation;  He has a PROAIR inhaler for Prn use but he seldom uses it, & he knows to use the OTC MUCINEX 1-2 Bid w/ fluids for congestion. ~  CXR 3/12 in hosp for CAE showed COPD/E, biapical pleuroparenchymal scarring, NAD, AICD on left, old left rib fx, old T12 vertebroplasty... ~  Fall at home 4/12 w/ signif trauma & prob right rib fxs (CXR in ER showed some atelec but NAD & no rib films done). ~  1/14: presents w/ URI & acute on chr COPD exac; CXR in ER showed norm heart size, clear lungs, AICD in place, compression T12 w/ augmentation. ~  2/14: he responded nicely to Levaquin, Pred, Advair250, Mucinex, etc; asked to STAY on the [id number]- Bid; he reports quitting the cigars!  HYPERTENSION (ICD-401.9) - back on COREG 3.125Bid & LISINOPRIL 5mg Qhs... Prev had to wean off these due to dizziness & post hypotension (resolved off Etoh). ~  12/11:  BP= 126/70 and doing well> denies HA, fatigue, visual changes, CP, palipit, dizziness, dyspnea, edema, etc; he has had postural hypotension & several syncopal episodes related to this- now improved w/ the final adjustment in his meds. ~  4/12:  BP= 90/58 ==>100/60 recheck & he is weak; asked to monitor BP at home & may need to decr meds. ~  5/12:  BP= 130/70 supine & 100/60 sitting & standing (no symptoms at present)> he has been off the Lisinopril & Coreg for 1wk now. ~  7/12:  BP= 120/72 & 140/70 w/o postural changes (improved off etoh); he is back on  Lisinopril 5mg  Qhs per Cards. ~  12/12:  BP= 128/74 on Coreg3.125Bid & Lisinopril5; denies CP, palpit, dizzy/syncope, ch in SOB/DOE, edema... ~  6/13:  BP= 122/68 & he remains largely asymptomatic... ~  1/14:  BP= 120/58 & he is here w/ acute on chr COPD exac; denies CP, palpit, etc...  ATHEROSCLEROTIC HEART DISEASE (ICD-414.00) - on ASA 81mg /d + above meds... ISCHEMIC CARDIOMYOPATHY (ICD-414.8) Hx of SYNCOPE (ICD-780.2) & IMPLANTABLE DEFIBRILLATOR, DDD MDT (ICD-V45.02) ~  Cath in 2003 w/ 2 vessel CAD and stent placed in LAD...  ~  Cardiolite 1/08 w/ large inferolat infarct, no ischemia, EF=36%...  ~  2DEcho 2/08 w/ infer & post HK, EF=40%...  ~  recath 6/09 w/ heavily calcif vessels & 40% EF- DrHochrein has been following carefully and adjusting meds. ~  AICD placed 2009 by DrKlein for hx syncope & ischemic cardiomyopathy- followed by DrKlein yearly & doing satis. ~  2DEcho 4/10 showed mild LVH, mod reduced LVF w/ EF=40-45% w/ inferobasal & post HK, mild MR, paradoxical septal motion. ~  9/10: Cards tried to incr the Coreg to 9.375mg Bid but pt intol & went back to 6.25Bid. ~  8/11:  f/u by DrHochrein w/ recent syncopal episode & meds adjusted Coreg 3.125Bid & Lisinopril 5Bid. ~  Serial XRays have all revealed signif atherosclerotic changes diffusely (eg- lumbar films 4/12, CT neck 4/12 as well). ~  4/12> ER visit for fall at home w/ signif trauma> ?post BP related, ?med related, ?other etiology > Cards eval & weaned off Coreg/ Lisinopril w/ resolution of postural hypotension. ~  7/12:  DrKlein restarted Lisin 5mg Qhs, BP improved, no further postural changes, f/u 2DEcho w/ EF=35-40% & Gr1DD... ~  12/12:  DrHochrein has him back on Coreg3.125Bid & Lisinopril5/d & stable overall... ~  7/13: he saw DrKlein w/ interrogation of his AICD showing some AFib episodes; he is considering anticoagulation but held off after discussion w/ DrHochrein...  CEREBROVASCULAR DISEASE PERIPHERAL VASCULAR DISEASE  (ICD-443.9) - on ASA 81mg /d...  ~  He is s/p AAA repair 2000 by [facility]... he is sedentary and hasn't had ABI's checked... I will leave this to DrHochrein... ~  CDopplers 7/10, 1/1,1 & 8/11 showed stable mod carotid dis bilat w/ heavy calcif plaque & 60-79% bilat ICA stenoses... ~  CDopplers 2/12 w/ worsening RICA velocities c/w 80-99% stenosis (stable 60-79% LICA stenosis);  He was evaluated by [facility] DrDickson & underwent a right CAE w/ DPA 3/12 without complications;  He has been doing satis post-op & they plan f/u CDoppler in 67mo intervals going forward... ~  NOTE:  CT Brain 4/12 in ER showed mild to mod cortical vol loss & cbll atrophy, sm vessel dis, no acute changes... Prom vasc calcif seen on neck films & in abd... ~  CDopplers 4/13 by drDickson showed patent right CAE sitew/ mild plaque; 40-59% left ICA stenosis felt to be stable...  HYPERCHOLESTEROLEMIA (ICD-272.0) - on SIMVASTATIN 40mg /d & FENOFIBRATE 160/d. ~  FLP 4/08 showed TChol 131, TG 100, HDL 41, LDL 70 ~  FLP 2/09 showed TChol 124, TG 132, HDL 37, LDL 61 ~  FLP 12/10 > pt never ret for FLP & insurance changed [employer] to [facility]. ~  FLP 12/11 showed TChol 112, TG 51, HDL 43, LDL 59 ~  4/12:  They report some difficult swallowing the Tricor capsule==> referred to GI for swallowing eval & switched to FENOFIBRATE 160mg /d. ~  FLP 12/12 on Simva40+Feno160 showed TChol 124, TG 78, HDL 37, LDL 72  GERD/ DYSPHAGIA> ~  4/12: pt noted some reflux symptoms & mild dysphagia for large capsule; Protonix 40mg /d started & refer to GI for eval; they also note weak voice & may need ENT eval if not resolved in follow up... ~  5/12:  Pt states all symptoms resolved on their own, didn't take PPI, didn't see GI, denies swallowing or voice issues now.  DIVERTICULOSIS OF COLON (ICD-562.10) > he takes fiber supplement daily. COLONIC POLYPS (ICD-211.3) HEMORRHOIDS (ICD-455.6) - last colonoscopy was 11/06 by DrPatterson showing divertics, several 1-25mm  polyps (hyperplastic), and hems...  Hx of LIVER FUNCTION TESTS, ABNORMAL (ICD-794.8) - improved off etoh... ~  labs 2/09 showed SGOT= 30, SGPT= 17 ~  labs 12/11  showed SGOT= 38, SGPT= 19 ~  Labs 12/12 off all etoh & LFTs all wnl...  DEGENERATIVE JOINT DISEASE (ICD-715.90) Hx of GOUT (ICD-274.9) - on ALLOPURINOL 100mg /d... ~  labs 2/09 showed Uric= 3.3  OSTEOPOROSIS (ICD-733.00) - supposed to be on Caltrate, MVI, Vit D... he had T12 compression after syncopal spell 2009 w/ vertebroplasty by DrDeveshwar...  BMD here 10/09 showed TScores +0.3 in Spine, & -2.7 in right FemNeck (Ortho eval by Janett Billow)... ~  labs 12/11 showed Vit D level = 22... rec to take Men's MVI + Vit D 2000 u daily. ~  Labs 12/12 showed Vit D level = 17; REC> start regular dosing of Calcium, Men's MVI, Vit D 5000u daily... ~  6/13: he never got the OTC Vit D so we will Rx w/ VitD 50K weekly Rx now...  DERMATITIS (ICD-692.9) - he has eosinophilia & saw Derm w/ rx for topical cream;  rec to take antihist as well...  Health Maintenance -  ~  GI: colonoscopy 11/06 w/ several hyperplastic polyps removed... ~  GU: PSA 12/12 = 1.50 ~  Immunizations:  he tells me that he had PNEUMOVAX & 2010 Flu shot 10/10... received TETANUS shot here 2003...   Past Surgical History  Procedure Laterality Date  . Abdominal aortic aneurysm repair  2002    by Dr. Hart Rochester  . Cad stent  02/2002    Dr. Antoine Poche  . Aicd placed  03/2008    Dr. Graciela Husbands  . Right corotid enderectomy  12/2010    Dr. Edilia Bo  . Carotid endarterectomy  2010    Outpatient Encounter Prescriptions as of 12/09/2012  Medication Sig Dispense Refill  . albuterol (PROAIR HFA) 108 (90 BASE) MCG/ACT inhaler Inhale 2 puffs into the lungs every 6 (six) hours as needed for wheezing.  8.5 each  0  . allopurinol (ZYLOPRIM) 100 MG tablet Take 1 tablet (100 mg total) by mouth daily.  30 tablet  3  . aspirin 81 MG tablet Take 81 mg by mouth every morning.       . carvedilol (COREG)  3.125 MG tablet Take 1 tablet (3.125 mg total) by mouth 2 (two) times daily with a meal.  30 tablet  6  . fenofibrate 160 MG tablet Take 160 mg by mouth every morning.      . Inulin (FIBERCHOICE PO) Take 2 tablets by mouth daily.       Marland Kitchen lisinopril (PRINIVIL,ZESTRIL) 5 MG tablet Take 1 tablet (5 mg total) by mouth at bedtime.  30 tablet  11  . simvastatin (ZOCOR) 40 MG tablet TAKE ONE TABLET BY MOUTH AT BEDTIME  90 tablet  1  . ergocalciferol (VITAMIN D2) 50000 UNITS capsule Take 50,000 Units by mouth once a week. On saturdays      . Fluticasone-Salmeterol (ADVAIR DISKUS) 250-50 MCG/DOSE AEPB Inhale 1 puff into the lungs 2 (two) times daily.  14 each  0  . [DISCONTINUED] HYDROcodone-homatropine (HYCODAN) 5-1.5 MG/5ML syrup Take 5 mLs by mouth every 4 (four) hours as needed for cough.  120 mL  0  . [DISCONTINUED] levofloxacin (LEVAQUIN) 500 MG tablet Take 1 tablet (500 mg total) by mouth daily.  10 tablet  0  . [DISCONTINUED] predniSONE (DELTASONE) 20 MG tablet Take as directed per taper schedule  20 tablet  0   No facility-administered encounter medications on file as of 12/09/2012.    No Known Allergies   Current Medications, Allergies, Past Medical History, Past Surgical History, Family History, and Social History were reviewed  in [employer] record.    Review of Systems         See HPI - all other systems neg except as noted... The patient complains of dyspnea on exertion, prolonged cough, and difficulty walking.  The patient denies anorexia, fever, weight loss, weight gain, vision loss, decreased hearing, hoarseness, chest pain, syncope, peripheral edema, headaches, hemoptysis, abdominal pain, melena, hematochezia, severe indigestion/heartburn, hematuria, incontinence, muscle weakness, suspicious skin lesions, transient blindness, depression, unusual weight change, abnormal bleeding, enlarged lymph nodes, and angioedema.     Objective:   Physical Exam      WD,  WN, 79 y/o WM > chr ill appearing but NAD... GENERAL:  Alert & oriented; pleasant & cooperative... HEENT:  /AT, EOM-full, PERRLA, EACs-clear  NOSE-clear, THROAT-clear & wnl, Voice sounds back to norm NECK:  Supple w/ fairROM; no JVD; prominent carotid impulses, scar on right, + bruits; no thyromegaly or nodules palpated; no lymphadenopathy... CHEST:  bilat rhonchi at bases & end-exp wheezing,  no rales, no signs of consolidation... HEART:  Regular Rhythm; gr 1/6 SEM, S4, no rubs... ABDOMEN:  Min Bruise in right flank, mild tender lower ribs; normal bowel sounds; no organomegaly or masses detected. EXT: without deformities, mild arthritic changes; no varicose veins/ +venous insuffic/ no edema. NEURO: no focal neuro deficits... DERM:  dry skin dermatitis, seborrhea, rosacea...  RADIOLOGY DATA:  Reviewed in the EPIC EMR & discussed w/ the patient...  LABORATORY DATA:  Reviewed in the EPIC EMR & discussed w/ the patient...   Assessment & Plan:    COPD>  Hx acute exac & dyspnea> he is congratulated on quitting smoking completely; Advised to stay on the Advair Bid.  Hx HBP & Hx Postural Hypotension>  BP normal now & tolerating Coreg 3.125Bid + Lisinopril 5mg  Qhs; no postural BP changes noted today & his dizziness has resolved; he notes all symptoms improved off etoh...  ASHD/ Cardiomyop/ AICD>  Followed by DHochrein & Graciela Husbands;  Continue current meds & f/u by Cards.  Periph Vasc Dis>  Followed by [facility], DrDickson & CDoppler 9/12 showed patent right CAE site w/ 40-59% left ICA stenosis & they are following...  CHOL>  He is overdue for FLP & will return fasting...  GI/ Dysphagia>  He notes symptoms resolved spont & he does not believe that he has a problem in this area, declines PPI Rx or GI eval...  FALL 4/12 w/ signif trauma>  Hx of trauma to right chest wall/ flank/ etc... Improved w/ rest, heat, rib binder, pain meds, IS, Proair, Mucinex, & Pred... More mobile & feeling better; He is off the  prev Vicodin etc;  Note compression fx, osteopenia, low vit d level==> he needs as a minimum Calcium/ Men's MVI/ Vit D 5000u daily==> start Vit D 50K weekly now...  Other medical issues as noted...   Patient's Medications  New Prescriptions   No medications on file  Previous Medications   ALBUTEROL (PROAIR HFA) 108 (90 BASE) MCG/ACT INHALER    Inhale 2 puffs into the lungs every 6 (six) hours as needed for wheezing.   ALLOPURINOL (ZYLOPRIM) 100 MG TABLET    Take 1 tablet (100 mg total) by mouth daily.   ASPIRIN 81 MG TABLET    Take 81 mg by mouth every morning.    CARVEDILOL (COREG) 3.125 MG TABLET    Take 1 tablet (3.125 mg total) by mouth 2 (two) times daily with a meal.   ERGOCALCIFEROL (VITAMIN D2) 50000 UNITS CAPSULE  Take 50,000 Units by mouth once a week. On saturdays   FENOFIBRATE 160 MG TABLET    Take 160 mg by mouth every morning.   FLUTICASONE-SALMETEROL (ADVAIR DISKUS) 250-50 MCG/DOSE AEPB    Inhale 1 puff into the lungs 2 (two) times daily.   INULIN (FIBERCHOICE PO)    Take 2 tablets by mouth daily.    LISINOPRIL (PRINIVIL,ZESTRIL) 5 MG TABLET    Take 1 tablet (5 mg total) by mouth at bedtime.   SIMVASTATIN (ZOCOR) 40 MG TABLET    TAKE ONE TABLET BY MOUTH AT BEDTIME  Modified Medications   No medications on file  Discontinued Medications   HYDROCODONE-HOMATROPINE (HYCODAN) 5-1.5 MG/5ML SYRUP    Take 5 mLs by mouth every 4 (four) hours as needed for cough.   LEVOFLOXACIN (LEVAQUIN) 500 MG TABLET    Take 1 tablet (500 mg total) by mouth daily.   PREDNISONE (DELTASONE) 20 MG TABLET    Take as directed per taper schedule

## 2013-02-06 ENCOUNTER — Other Ambulatory Visit: Payer: Self-pay | Admitting: Pulmonary Disease

## 2013-02-09 ENCOUNTER — Other Ambulatory Visit: Payer: Self-pay

## 2013-02-09 ENCOUNTER — Ambulatory Visit (INDEPENDENT_AMBULATORY_CARE_PROVIDER_SITE_OTHER): Payer: Medicare Other | Admitting: *Deleted

## 2013-02-09 DIAGNOSIS — Z95810 Presence of automatic (implantable) cardiac defibrillator: Secondary | ICD-10-CM

## 2013-02-09 DIAGNOSIS — I2589 Other forms of chronic ischemic heart disease: Secondary | ICD-10-CM

## 2013-02-11 ENCOUNTER — Other Ambulatory Visit (INDEPENDENT_AMBULATORY_CARE_PROVIDER_SITE_OTHER): Payer: Medicare Other | Admitting: *Deleted

## 2013-02-11 ENCOUNTER — Ambulatory Visit: Payer: Medicare Other | Admitting: Neurosurgery

## 2013-02-11 DIAGNOSIS — Z48812 Encounter for surgical aftercare following surgery on the circulatory system: Secondary | ICD-10-CM

## 2013-02-11 DIAGNOSIS — I6529 Occlusion and stenosis of unspecified carotid artery: Secondary | ICD-10-CM

## 2013-02-20 ENCOUNTER — Encounter: Payer: Self-pay | Admitting: *Deleted

## 2013-02-20 ENCOUNTER — Other Ambulatory Visit: Payer: Self-pay | Admitting: Pulmonary Disease

## 2013-02-22 LAB — REMOTE ICD DEVICE
BAMS-0001: 170 {beats}/min
BATTERY VOLTAGE: 3.0102 V
DEV-0020ICD: NEGATIVE
FVT: 0
PACEART VT: 0
TZAT-0001FASTVT: 1
TZAT-0012SLOWVT: 200 ms
TZAT-0019SLOWVT: 8 V
TZAT-0020SLOWVT: 1.5 ms
TZON-0003SLOWVT: 360 ms
TZON-0003VSLOWVT: 400 ms
TZON-0004SLOWVT: 16
TZST-0001FASTVT: 2
TZST-0001FASTVT: 4
TZST-0001FASTVT: 6
TZST-0001SLOWVT: 2
TZST-0002FASTVT: NEGATIVE
TZST-0002FASTVT: NEGATIVE
TZST-0002SLOWVT: NEGATIVE
TZST-0002SLOWVT: NEGATIVE
TZST-0002SLOWVT: NEGATIVE
VENTRICULAR PACING ICD: 0 pct
VF: 0

## 2013-02-25 ENCOUNTER — Telehealth: Payer: Self-pay | Admitting: Internal Medicine

## 2013-02-25 NOTE — Telephone Encounter (Signed)
Spoke with pt in regards to transmission. Transmission was received on 02-09-13. Pt aware and was informed to disregard letter.

## 2013-02-25 NOTE — Telephone Encounter (Signed)
New Problem:    Patient called in because he received a letter stating that he missed his last transmission date when he claims that he did transmit.  Please call back.

## 2013-03-19 ENCOUNTER — Encounter: Payer: Self-pay | Admitting: *Deleted

## 2013-03-25 ENCOUNTER — Encounter: Payer: Self-pay | Admitting: Internal Medicine

## 2013-03-30 ENCOUNTER — Other Ambulatory Visit: Payer: Self-pay | Admitting: Cardiology

## 2013-03-30 NOTE — Telephone Encounter (Signed)
..   Requested Prescriptions   Pending Prescriptions Disp Refills  . carvedilol (COREG) 3.125 MG tablet [Pharmacy Med Name: CARVEDILOL 3.125 MG TABLET] 60 tablet 2    Sig: TAKE 1 TABLET TWICE DAILY WITH A MEAL.  Marland Kitchen.Patient needs to contact office to schedule  Appointment  for future refills.[phone]. Thank you.

## 2013-04-02 ENCOUNTER — Telehealth: Payer: Self-pay | Admitting: Pulmonary Disease

## 2013-04-02 NOTE — Telephone Encounter (Signed)
Pt was not calling about himself, but his spouse.  Antionette Fairy

## 2013-04-27 ENCOUNTER — Telehealth: Payer: Self-pay | Admitting: Cardiology

## 2013-04-27 NOTE — Telephone Encounter (Signed)
New Problem ° ° °

## 2013-04-28 ENCOUNTER — Ambulatory Visit (INDEPENDENT_AMBULATORY_CARE_PROVIDER_SITE_OTHER): Payer: Medicare Other | Admitting: *Deleted

## 2013-04-28 ENCOUNTER — Other Ambulatory Visit: Payer: Self-pay | Admitting: *Deleted

## 2013-04-28 ENCOUNTER — Other Ambulatory Visit (INDEPENDENT_AMBULATORY_CARE_PROVIDER_SITE_OTHER): Payer: Medicare Other

## 2013-04-28 DIAGNOSIS — I2589 Other forms of chronic ischemic heart disease: Secondary | ICD-10-CM

## 2013-04-28 DIAGNOSIS — Z95810 Presence of automatic (implantable) cardiac defibrillator: Secondary | ICD-10-CM

## 2013-04-28 DIAGNOSIS — I428 Other cardiomyopathies: Secondary | ICD-10-CM

## 2013-04-28 LAB — BASIC METABOLIC PANEL
BUN: 18 mg/dL (ref 6–23)
CO2: 27 mEq/L (ref 19–32)
Calcium: 9.2 mg/dL (ref 8.4–10.5)
Creatinine, Ser: 1.4 mg/dL (ref 0.4–1.5)
GFR: 52.67 mL/min — ABNORMAL LOW (ref >60.00)
Glucose, Bld: 95 mg/dL (ref 70–99)
Sodium: 137 mEq/L (ref 135–145)

## 2013-04-28 LAB — ICD DEVICE OBSERVATION
AL IMPEDENCE ICD: 475 Ohm
BAMS-0001: 170 {beats}/min
BATTERY VOLTAGE: 2.9216 V
CHARGE TIME: 5.235 s
RV LEAD AMPLITUDE: 4.75 mv
RV LEAD IMPEDENCE ICD: 475 Ohm
TOT-0001: 4
TOT-0002: 1
TOT-0006: 20090623000000
TZAT-0002SLOWVT: NEGATIVE
TZAT-0012FASTVT: 200 ms
TZAT-0012SLOWVT: 200 ms
TZAT-0018FASTVT: NEGATIVE
TZAT-0020FASTVT: 1.5 ms
TZAT-0020SLOWVT: 1.5 ms
TZON-0003SLOWVT: 360 ms
TZST-0001FASTVT: 2
TZST-0001FASTVT: 4
TZST-0001FASTVT: 5
TZST-0001FASTVT: 6
TZST-0001SLOWVT: 3
TZST-0001SLOWVT: 5
TZST-0002FASTVT: NEGATIVE
TZST-0002FASTVT: NEGATIVE
TZST-0002SLOWVT: NEGATIVE
TZST-0002SLOWVT: NEGATIVE
TZST-0002SLOWVT: NEGATIVE

## 2013-04-28 NOTE — Progress Notes (Signed)
Called pt in due to 2 shocks while overseas. Device check in clinic, dec. RV sensitivity from 0.52mV to 0.1mV. BMP today, stress test to be scheduled to monitor T-wave elevation while device therapy disabled. ROV w/ SK 05/14/13 @ 3:15pm. Estella Husk

## 2013-05-08 ENCOUNTER — Encounter: Payer: Self-pay | Admitting: Internal Medicine

## 2013-05-13 ENCOUNTER — Encounter: Payer: Self-pay | Admitting: *Deleted

## 2013-05-14 ENCOUNTER — Encounter: Payer: Medicare Other | Admitting: Internal Medicine

## 2013-05-14 ENCOUNTER — Encounter: Payer: Self-pay | Admitting: Internal Medicine

## 2013-05-14 ENCOUNTER — Ambulatory Visit (INDEPENDENT_AMBULATORY_CARE_PROVIDER_SITE_OTHER): Payer: Medicare Other | Admitting: Internal Medicine

## 2013-05-14 DIAGNOSIS — R9431 Abnormal electrocardiogram [ECG] [EKG]: Secondary | ICD-10-CM

## 2013-05-14 LAB — ICD DEVICE OBSERVATION
DEV-0020ICD: NEGATIVE
TZAT-0001FASTVT: 1
TZAT-0001SLOWVT: 1
TZAT-0002FASTVT: NEGATIVE
TZAT-0012FASTVT: 200 ms
TZAT-0018FASTVT: NEGATIVE
TZAT-0020SLOWVT: 1.5 ms
TZON-0003SLOWVT: 360 ms
TZON-0003VSLOWVT: 400 ms
TZST-0001FASTVT: 2
TZST-0001FASTVT: 4
TZST-0001SLOWVT: 2
TZST-0001SLOWVT: 3
TZST-0001SLOWVT: 4
TZST-0001SLOWVT: 5
TZST-0002FASTVT: NEGATIVE
TZST-0002FASTVT: NEGATIVE
TZST-0002FASTVT: NEGATIVE
TZST-0002SLOWVT: NEGATIVE

## 2013-05-14 NOTE — Progress Notes (Signed)
Exercise Treadmill Test  Pre-Exercise Testing Evaluation Rhythm: normal sinus  Rate: 66                 Test  Exercise Tolerance Test Ordering MD: Sherryl Manges, MD  Interpreting MD: Sherryl Manges, MD  Unique Test No: 1  Treadmill:  1  Indication for ETT: Defib check/abn ekg  Contraindication to ETT: No   Stress Modality: exercise - treadmill  Cardiac Imaging Performed: non   Protocol: standard Bruce - maximal  Max BP:   158/81  Max MPHR (bpm):  141 85% MPR (bpm):  119  MPHR obtained (bpm):  134 % MPHR obtained:  95%  Reached 85% MPHR (min:sec):  1:48 Total Exercise Time (min-sec):  2:16  Workload in METS:  4.6 Borg Scale: 17  Reason ETT Terminated:  hypotension (>33mm drop SBP)    ST Segment Analysis At Rest: baselien repolarization abnormality With Exercise: baseline abnormality enhanced  Other Information Arrhythmia:  No Angina during ETT:  absent (0) Quality of ETT:  diagnostic  ETT Interpretation:  normal - no evidence of ischemia by ST analysis  Comments: No twave oversensing  Recommendations:  continue current course

## 2013-05-14 NOTE — Patient Instructions (Signed)
Your physician has requested that you have a lexiscan myoview. For further information please visit [email or web address] Please follow instruction sheet, as given.  Your physician wants you to follow-up in: 3 months with Dr. Antoine Poche. You will receive a reminder letter in the mail two months in advance. If you don't receive a letter, please call our office to schedule the follow-up appointment.

## 2013-05-20 ENCOUNTER — Ambulatory Visit (HOSPITAL_COMMUNITY): Payer: Medicare Other | Attending: Cardiovascular Disease | Admitting: Radiology

## 2013-05-20 ENCOUNTER — Encounter (HOSPITAL_COMMUNITY): Payer: Medicare Other

## 2013-05-20 ENCOUNTER — Encounter (HOSPITAL_COMMUNITY): Payer: Self-pay

## 2013-05-20 VITALS — BP 133/78 | Ht 71.0 in | Wt 187.0 lb

## 2013-05-20 DIAGNOSIS — R0602 Shortness of breath: Secondary | ICD-10-CM | POA: Diagnosis not present

## 2013-05-20 DIAGNOSIS — R9431 Abnormal electrocardiogram [ECG] [EKG]: Secondary | ICD-10-CM | POA: Diagnosis not present

## 2013-05-20 DIAGNOSIS — I739 Peripheral vascular disease, unspecified: Secondary | ICD-10-CM | POA: Insufficient documentation

## 2013-05-20 DIAGNOSIS — I4891 Unspecified atrial fibrillation: Secondary | ICD-10-CM

## 2013-05-20 DIAGNOSIS — I252 Old myocardial infarction: Secondary | ICD-10-CM | POA: Diagnosis not present

## 2013-05-20 DIAGNOSIS — R0609 Other forms of dyspnea: Secondary | ICD-10-CM | POA: Diagnosis not present

## 2013-05-20 DIAGNOSIS — I10 Essential (primary) hypertension: Secondary | ICD-10-CM | POA: Insufficient documentation

## 2013-05-20 DIAGNOSIS — Z9861 Coronary angioplasty status: Secondary | ICD-10-CM | POA: Diagnosis not present

## 2013-05-20 DIAGNOSIS — I6529 Occlusion and stenosis of unspecified carotid artery: Secondary | ICD-10-CM | POA: Insufficient documentation

## 2013-05-20 DIAGNOSIS — R5381 Other malaise: Secondary | ICD-10-CM | POA: Diagnosis not present

## 2013-05-20 DIAGNOSIS — I2510 Atherosclerotic heart disease of native coronary artery without angina pectoris: Secondary | ICD-10-CM | POA: Diagnosis not present

## 2013-05-20 DIAGNOSIS — R5383 Other fatigue: Secondary | ICD-10-CM | POA: Insufficient documentation

## 2013-05-20 DIAGNOSIS — J449 Chronic obstructive pulmonary disease, unspecified: Secondary | ICD-10-CM | POA: Diagnosis not present

## 2013-05-20 DIAGNOSIS — F17200 Nicotine dependence, unspecified, uncomplicated: Secondary | ICD-10-CM | POA: Diagnosis not present

## 2013-05-20 DIAGNOSIS — R002 Palpitations: Secondary | ICD-10-CM | POA: Insufficient documentation

## 2013-05-20 DIAGNOSIS — R0989 Other specified symptoms and signs involving the circulatory and respiratory systems: Secondary | ICD-10-CM | POA: Insufficient documentation

## 2013-05-20 DIAGNOSIS — J4489 Other specified chronic obstructive pulmonary disease: Secondary | ICD-10-CM | POA: Insufficient documentation

## 2013-05-20 MED ORDER — REGADENOSON 0.4 MG/5ML IV SOLN
0.4000 mg | Freq: Once | INTRAVENOUS | Status: AC
  Administered 2013-05-20: 0.4 mg via INTRAVENOUS

## 2013-05-20 MED ORDER — TECHNETIUM TC 99M SESTAMIBI GENERIC - CARDIOLITE
30.0000 | Freq: Once | INTRAVENOUS | Status: AC | PRN
Start: 2013-05-20 — End: 2013-05-20
  Administered 2013-05-20: 30 via INTRAVENOUS

## 2013-05-20 MED ORDER — TECHNETIUM TC 99M SESTAMIBI GENERIC - CARDIOLITE
10.0000 | Freq: Once | INTRAVENOUS | Status: AC | PRN
  Administered 2013-05-20: 10 via INTRAVENOUS

## 2013-05-20 NOTE — Progress Notes (Signed)
[facility] SITE 3 NUCLEAR MED [address] [place], [place] [address] [phone]    Cardiology Nuclear Med Study  [name] is a 79 y.o. male     MRN : [id number]     DOB: Feb 10, 1931  Procedure Date: 05/20/2013  Nuclear Med Background Indication for Stress Test:  Evaluation for Ischemia and Abnormal EKG History:  COPD and AFIB,ICM, '99 AAA repair, 5/03 Stent LAD 1/08 MPS: large inferolateral infarct (-) ischemia EF: 36% 6/09 Deibrillator: Syncope Heart Cath: severe single CAD CFX patent LAD stent EF: 40% 7/12 ECHO: EF: 35-40% mild LVH 05/14/13 GXT: baseline repolarization abn enhanced with exercise Cardiac Risk Factors: Carotid Disease, Hypertension, Lipids, PVD, RBBB and Smoker  Symptoms:  DOE, Fatigue, Palpitations and SOB   Nuclear Pre-Procedure Caffeine/Decaff Intake:  None> 12 hrs NPO After: 7:00am   Lungs:  clear O2 Sat: 98% on room air. IV 0.9% NS with Angio Cath:  22g  IV Site: R Wrist x 1, tolerated well IV Started by:  Irean Hong, RN  Chest Size (in):  42 Cup Size: n/a  Height: 5\' 11"  (1.803 m)  Weight:  187 lb (84.823 kg)  BMI:  Body mass index is 26.09 kg/(m^2). Tech Comments:  Took Coreg this am. Patient has gained 19 lbs since 11-2012.Images and weight gain reviewed with Dr. Tonny Bollman, MD with follow up office visit made with Tereso Newcomer, [facility] on 05-26-13. Irean Hong, RN.    Nuclear Med Study 1 or 2 day study: 1 day  Stress Test Type:  Eugenie Birks  Reading MD: Charlton Haws, MD  Order Authorizing Provider:  Sherryl Manges, MD  Resting Radionuclide: Technetium 5m Sestamibi  Resting Radionuclide Dose: 11.0 mCi   Stress Radionuclide:  Technetium 41m Sestamibi  Stress Radionuclide Dose: 33.0 mCi           Stress Protocol Rest HR: 63 Stress HR: 82  Rest BP: 133/78 Stress BP: 153/72  Exercise Time (min): n/a METS: n/a   Predicted Max HR: 140 bpm % Max HR: 57.14 bpm Rate Pressure Product: 46962   Dose of Adenosine (mg):  n/a Dose of  Lexiscan: 0.4 mg  Dose of Atropine (mg): n/a Dose of Dobutamine: n/a mcg/kg/min (at max HR)  Stress Test Technologist: Milana Na, EMT-P  Nuclear Technologist:  Domenic Polite, CNMT     Rest Procedure:  Myocardial perfusion imaging was performed at rest 45 minutes following the intravenous administration of Technetium 19m Sestamibi. Rest ECG: No acute changes and NSR RBBB old IMI  Stress Procedure:  The patient received IV Lexiscan 0.4 mg over 15-seconds.  Technetium 62m Sestamibi injected at 30-seconds. This patient was sob with the Lexiscan injection. Quantitative spect images were obtained after a 45 minute delay. Stress ECG: No significant change from baseline ECG  QPS Raw Data Images:  Patient motion noted. Stress Images:  There is decreased uptake in the inferior wall. Rest Images:  There is decreased uptake in the inferior wall. Subtraction (SDS):  There is a fixed defect that is most consistent with a previous infarction. Transient Ischemic Dilatation (Normal <1.22):  n/a Lung/Heart Ratio (Normal <0.45):  0.39  Quantitative Gated Spect Images QGS EDV:  152 ml QGS ESV:  112 ml  Impression Exercise Capacity:  Lexiscan with no exercise. BP Response:  Normal blood pressure response. Clinical Symptoms:  No significant symptoms noted. ECG Impression:  No significant ST segment change suggestive of ischemia. Comparison with Prior Nuclear Study: No images to compare  Overall Impression:  Intermediate  risk stress nuclear study Large posterolateral wall infarct at mid and basal level with no signficant ischemia.  LV Ejection Fraction: 26%.  LV Wall Motion:  Diffuse hypokinesis worse in the posterolateal wall  Charlton Haws

## 2013-05-20 NOTE — Progress Notes (Signed)
Patient ID: [name], male   DOB: 1931/08/09, 79 y.o.   MRN: [id number] [name], DOB 2031-09-06 here for a Lexiscan Cardiolite. On arrival, patients' weight was 187 lb. His weight was 168 on 12-09-2012. Mr. Harshfield said he went on a cruise 04-13-13 to 04-26-13 and ate alot. He has slight increase DOE, mild cough with clear phlegm, no wheezing, lungs clear, no pedal edema. O2 sat was 98% RA, HR 64. Dr. Tonny Bollman reviewed images, and discussed weight gain with orders to have a follow-up with PA or NP.Office visit made for 05-26-13 with Tereso Newcomer, PAC. Irean Hong, RN.

## 2013-05-26 ENCOUNTER — Ambulatory Visit (INDEPENDENT_AMBULATORY_CARE_PROVIDER_SITE_OTHER): Payer: Medicare Other | Admitting: Physician Assistant

## 2013-05-26 ENCOUNTER — Encounter: Payer: Self-pay | Admitting: Physician Assistant

## 2013-05-26 VITALS — BP 141/69 | HR 60 | Ht 71.0 in | Wt 189.0 lb

## 2013-05-26 DIAGNOSIS — I10 Essential (primary) hypertension: Secondary | ICD-10-CM

## 2013-05-26 DIAGNOSIS — I2589 Other forms of chronic ischemic heart disease: Secondary | ICD-10-CM | POA: Diagnosis not present

## 2013-05-26 DIAGNOSIS — I4891 Unspecified atrial fibrillation: Secondary | ICD-10-CM

## 2013-05-26 DIAGNOSIS — I2510 Atherosclerotic heart disease of native coronary artery without angina pectoris: Secondary | ICD-10-CM

## 2013-05-26 DIAGNOSIS — E785 Hyperlipidemia, unspecified: Secondary | ICD-10-CM

## 2013-05-26 DIAGNOSIS — I5022 Chronic systolic (congestive) heart failure: Secondary | ICD-10-CM | POA: Diagnosis not present

## 2013-05-26 DIAGNOSIS — R0602 Shortness of breath: Secondary | ICD-10-CM

## 2013-05-26 DIAGNOSIS — R635 Abnormal weight gain: Secondary | ICD-10-CM

## 2013-05-26 LAB — BASIC METABOLIC PANEL
Calcium: 9.6 mg/dL (ref 8.4–10.5)
GFR: 60.71 mL/min (ref >60.00)
Sodium: 136 mEq/L (ref 135–145)

## 2013-05-26 NOTE — Patient Instructions (Signed)
LABS TODAY: BMET, BNP  Your physician has requested that you have an echocardiogram. Echocardiography is a painless test that uses sound waves to create images of your heart. It provides your doctor with information about the size and shape of your heart and how well your heart's chambers and valves are working. This procedure takes approximately one hour. There are no restrictions for this procedure.  Your physician recommends that you schedule a follow-up appointment in: 2-3 months with Dr.Hochrein

## 2013-05-26 NOTE — Progress Notes (Signed)
[address] [place], [place]  [address] Phone: [phone] Fax:  [phone]  Date:  05/26/2013   ID:  [name], [name] 04-07-1931, MRN [id number]  PCP:  Michele Mcalpine, MD  Cardiologist:  Dr. [name]   Electrophysiologist:  Dr. Sherryl Manges    History of Present Illness: [name] is a 79 y.o. male who returns for f/u on weight gain.  He has a history of CAD, ischemic cardiomyopathy, EF 40%, syncope, status post AICD, PAD, status post AAA repair 2002, carotid stenosis, status post right CEA 2012, HTN, HL, tobacco abuse, COPD. Abnormal Cardiolite prompted LHC 6/09: Ostial LAD 25%, proximal LAD stent with mild ISR, mid to distal LAD 50%, ostial D1 40%, proximal AV circumflex subtotally occluded-filled by bridging collaterals, proximal MOM 30%, EF 40%, anterior AK, inferolateral HK. Last echocardiogram 04/2011: Mild LVH, EF 35-40%, grade 1 diastolic dysfunction.  Last seen by Dr. Antoine Poche in 08/2011. Patient was noted to have some episodes of atrial fibrillation on interrogation of his device back in 04/2012 by Dr. Graciela Husbands.  However, he was not felt to be a good candidate for anticoagulation due to history of alcohol abuse. Episodes were brief.   He was recently out of the country on a cruise and was shocked by his defibrillator x2. Interrogation of his device demonstrated T wave oversensing. Adjustments were made in his device. Exercise treadmill test 05/14/13 demonstrated no T wave oversensing and no ischemic ST changes. He did have some hypotension with exercise. He was therefore set up for a [employer]. Lexiscan Myoview 05/21/13: Intermediate risk, large posterior lateral infarct, no ischemia, EF 26%. It was noted that the patient had gained a significant amount of weight over the last several months. He was therefore sent up for follow up today.  The patient tells me he quit smoking back in January. The last time he quit smoking, he gained a significant amount  of weight. He notes similar symptoms this time around. He does have some dyspnea with exertion since January. He really describes NYHA class II symptoms. He denies orthopnea, PND. He has mild ankle edema from time to time. No significant change. He has occasional gas pains in his chest. He denies any exertional chest discomfort or heaviness. He denies arm or jaw discomfort with exertion. He denies nausea or diaphoresis. He denies syncope.  Labs (7/14):   K 4.2, creatinine 1.4  Wt Readings from Last 3 Encounters:  05/26/13 189 lb (85.73 kg)  05/20/13 187 lb (84.823 kg)  12/09/12 168 lb 9.6 oz (76.476 kg)     Past Medical History  Diagnosis Date  . Allergic rhinitis   . COPD (chronic obstructive pulmonary disease)   . Tobacco abuse   . Hypertension   . Atherosclerotic heart disease   . Ischemic cardiomyopathy   . Syncope   . Presence of cardiac defibrillator   . Peripheral vascular disease   . Hypercholesterolemia   . Diverticulosis of colon   . History of colonic polyps   . Hemorrhoids   . Abnormal liver function tests   . DJD (degenerative joint disease)   . History of gout   . Back pain   . Compression fracture   . Osteoporosis   . Dermatitis     Current Outpatient Prescriptions  Medication Sig Dispense Refill  . allopurinol (ZYLOPRIM) 100 MG tablet TAKE 1 TABLET ONCE DAILY.  30 tablet  6  . aspirin 81 MG tablet Take 81 mg by mouth  every morning.       . carvedilol (COREG) 3.125 MG tablet TAKE 1 TABLET TWICE DAILY WITH A MEAL.  60 tablet  2  . fenofibrate 160 MG tablet TAKE 1 TABLET DAILY.  30 tablet  6  . Fluticasone-Salmeterol (ADVAIR DISKUS) 250-50 MCG/DOSE AEPB Inhale 1 puff into the lungs 2 (two) times daily.  14 each  0  . Inulin (FIBERCHOICE PO) Take 2 tablets by mouth daily.       Marland Kitchen lisinopril (PRINIVIL,ZESTRIL) 5 MG tablet Take 1 tablet (5 mg total) by mouth at bedtime.  30 tablet  11  . simvastatin (ZOCOR) 40 MG tablet TAKE ONE TABLET BY MOUTH AT BEDTIME  90  tablet  1   No current facility-administered medications for this visit.    Allergies:   No Known Allergies  Social History:  The patient  reports that he has quit smoking. His smoking use included Cigars. He has never used smokeless tobacco. He reports that he drinks about 1.8 ounces of alcohol per week. He reports that he does not use illicit drugs.   ROS:  Please see the history of present illness.    All other systems reviewed and negative.   PHYSICAL EXAM: VS:  BP 141/69  Pulse 60  Ht 5\' 11"  (1.803 m)  Wt 189 lb (85.73 kg)  BMI 26.37 kg/m2 Well nourished, well developed, in no acute distress HEENT: normal Neck: no JVD Cardiac:  normal S1, S2; RRR; no murmur Lungs:  clear to auscultation bilaterally, no wheezing, rhonchi or rales Abd: soft, nontender, no hepatomegaly Ext: no edema Skin: warm and dry Neuro:  CNs 2-12 intact, no focal abnormalities noted  EKG:  Atrial paced, HR 60, lateral T wave inversions, no significant change since prior tracing     ASSESSMENT AND PLAN:  1. Weight Gain:  This seems to be c/w increased appetite in the setting of smoking cessation.  We discussed continued care with his diet. 2. Chronic Systolic CHF:  He is NYHA Class II.  He does not look significantly volume overloaded on exam.  Will check a BMET and BNP with his weight gain.  If his BNP is not significantly abnormal, will continue current Rx.  Arrange follow up echocardiogram. 3. CAD:  His myoview is similar to his prior study.  No angina.  Continue aspirin and statin.  EF is much lower on his nuclear study than his prior echocardiogram. Arrange follow up echocardiogram. 4. Status Post AICD:  No further therapies delivered. Continue follow up with EP as planned. 5. Hypertension: Controlled. 6. Hyperlipidemia: Continue statin. 7. PAD: Continue follow up with a [facility]. 8. Disposition: Follow up with Dr. Antoine Poche in 2-3 months.  Signed, Tereso Newcomer, PA-C  05/26/2013 5:07 PM

## 2013-05-27 ENCOUNTER — Encounter (HOSPITAL_COMMUNITY): Payer: Medicare Other

## 2013-06-04 ENCOUNTER — Ambulatory Visit (HOSPITAL_COMMUNITY): Payer: Medicare Other | Attending: Physician Assistant | Admitting: Radiology

## 2013-06-04 DIAGNOSIS — E785 Hyperlipidemia, unspecified: Secondary | ICD-10-CM | POA: Diagnosis not present

## 2013-06-04 DIAGNOSIS — I10 Essential (primary) hypertension: Secondary | ICD-10-CM | POA: Insufficient documentation

## 2013-06-04 DIAGNOSIS — I4891 Unspecified atrial fibrillation: Secondary | ICD-10-CM

## 2013-06-04 DIAGNOSIS — I2510 Atherosclerotic heart disease of native coronary artery without angina pectoris: Secondary | ICD-10-CM

## 2013-06-04 DIAGNOSIS — R0602 Shortness of breath: Secondary | ICD-10-CM

## 2013-06-04 DIAGNOSIS — I2589 Other forms of chronic ischemic heart disease: Secondary | ICD-10-CM | POA: Diagnosis not present

## 2013-06-04 DIAGNOSIS — I509 Heart failure, unspecified: Secondary | ICD-10-CM | POA: Diagnosis not present

## 2013-06-04 NOTE — Progress Notes (Signed)
Echocardiogram performed.  

## 2013-06-07 ENCOUNTER — Encounter: Payer: Self-pay | Admitting: Physician Assistant

## 2013-06-08 ENCOUNTER — Encounter: Payer: Self-pay | Admitting: Pulmonary Disease

## 2013-06-08 ENCOUNTER — Ambulatory Visit (INDEPENDENT_AMBULATORY_CARE_PROVIDER_SITE_OTHER): Payer: Medicare Other | Admitting: Pulmonary Disease

## 2013-06-08 ENCOUNTER — Telehealth: Payer: Self-pay | Admitting: *Deleted

## 2013-06-08 VITALS — BP 102/60 | HR 64 | Temp 97.9°F | Ht 71.0 in | Wt 190.8 lb

## 2013-06-08 DIAGNOSIS — Z95810 Presence of automatic (implantable) cardiac defibrillator: Secondary | ICD-10-CM

## 2013-06-08 DIAGNOSIS — R1319 Other dysphagia: Secondary | ICD-10-CM

## 2013-06-08 DIAGNOSIS — I2510 Atherosclerotic heart disease of native coronary artery without angina pectoris: Secondary | ICD-10-CM

## 2013-06-08 DIAGNOSIS — N320 Bladder-neck obstruction: Secondary | ICD-10-CM

## 2013-06-08 DIAGNOSIS — E7800 Pure hypercholesterolemia, unspecified: Secondary | ICD-10-CM

## 2013-06-08 DIAGNOSIS — K5730 Diverticulosis of large intestine without perforation or abscess without bleeding: Secondary | ICD-10-CM

## 2013-06-08 DIAGNOSIS — I2589 Other forms of chronic ischemic heart disease: Secondary | ICD-10-CM

## 2013-06-08 DIAGNOSIS — I6529 Occlusion and stenosis of unspecified carotid artery: Secondary | ICD-10-CM

## 2013-06-08 DIAGNOSIS — R55 Syncope and collapse: Secondary | ICD-10-CM

## 2013-06-08 DIAGNOSIS — J449 Chronic obstructive pulmonary disease, unspecified: Secondary | ICD-10-CM

## 2013-06-08 DIAGNOSIS — I10 Essential (primary) hypertension: Secondary | ICD-10-CM | POA: Diagnosis not present

## 2013-06-08 DIAGNOSIS — M1990 Unspecified osteoarthritis, unspecified site: Secondary | ICD-10-CM

## 2013-06-08 DIAGNOSIS — I739 Peripheral vascular disease, unspecified: Secondary | ICD-10-CM

## 2013-06-08 DIAGNOSIS — F419 Anxiety disorder, unspecified: Secondary | ICD-10-CM

## 2013-06-08 DIAGNOSIS — T148XXA Other injury of unspecified body region, initial encounter: Secondary | ICD-10-CM

## 2013-06-08 DIAGNOSIS — E559 Vitamin D deficiency, unspecified: Secondary | ICD-10-CM

## 2013-06-08 NOTE — Telephone Encounter (Signed)
pt notified about echo results with verbal understanding 

## 2013-06-08 NOTE — Progress Notes (Signed)
Subjective:    Patient ID: [name], male    DOB: Oct 02, 1931, 79 y.o.   MRN: [id number]  HPI 79 y/o WM here for a follow up visit... he has been followed closely by DrHochrein & DrKlein during the past years due to his cardiomyopathy & AICD...   ~  SEE PREV EPIC NOTES FOR THE EARLIER DATA >>  ~  October 10, 2011:  52mo ROV & he is improved overall (he credits quitting Etoh), notes some itching w/ eval by Derm (given cream for itching), otherw no new complaints or concerns... COPD> he has Proair for prn use; doing better w/ cut back on cigar consumption... HBP> BP= 128/74 today on Coreg3.125Bid & Lisinopril5; denies CP, palpit, dizzy/syncope, ch in SOB/DOE, edema... ASHD/ Cardiomyopathy> improved w/ BBlocker/ ACE rx, not requiring diuretic; he saw DrHochrein 11/12> doing better, no changes made. Periph Vasc Dis> AAA repair 2000 by [facility], still too sedentary & he declined cardiac rehab; s/p R CAE w/ DPA 3/12 by DrCDickson; f/u CDoppler by [facility] 9/12 showed patent rightCAE site & 40-59% left ICA stenosis; they plan regular follow ups... CHOL> on Simva40 + fenofib160 & FLP looks good today (see below)... GI> GERD, Divertics, Polyps,etc>  GI reported stable on incr Fiber intake... DJD, osteoporosis, compression fx>  Vit D level is low at 17 & rec to take Calcium, Men's formula MVI, Vit D OTC 5000u daily... DERM> he saw Derm re: his rash (?seb dermatitis) and itching; labs show 22% eos & rec to take Antihist eg Buel Ream, Zyrkek daily...  ~  April 09, 2012:  75mo ROV & [name] says he is doing fine- no new complaints or concerns; he has a dry skin dermatitis & has seen Jeronimo Norma; He never did start the Vit D supplement after our last OV & today we wrote for 50K weekly Rx... COPD> he has Proair for prn use; doing better w/ cut back on cigar consumption... HBP> on Coreg3.125Bid & Lisinopril5; BP= 122/68 & denies CP, palpit, dizzy/syncope, ch in SOB/DOE, edema... ASHD/ Cardiomyopathy> on ASA  81mg /d; improved w/ BBlocker/ ACE rx, not requiring diuretic and denies CP/ angina, etc... Periph Vasc Dis> AAA repair 2000 by [facility], still too sedentary & he declined cardiac rehab; s/p R CAE w/ DPA 3/12 by DrCDickson; f/u CDoppler by [facility] 9/12 showed patent rightCAE site & 40-59% left ICA stenosis; they plan regular follow ups... CHOL> on Simva40 + Fenofib160; FLP 12/12 looked ok- continue same... GI> GERD, Divertics, Polyps,etc>  GI reported stable on incr Fiber intake... DJD, osteoporosis, compression fx>  Vit D level is low at 17 & rec to take Calcium, Men's formula MVI, & Rx written for VitD 50K weekly... DERM> he saw Derm re: his rash (?seb dermatitis) and itching; labs show 22% eos & rec to take Antihist eg Buel Ream, Zyrkek daily...    We reviewed prob list, meds, xrays and labs> see below>>  ~  October 30, 2012:  75mo ROV & [name] presents w/ a 2wk hx URI starting w/ low grade temp, chills, & feeling cold/ feeling bad; then developed cough w/ thick yellow phlegm and chest congestion- no hemoptysis, no CP x sore from coughing; he tried OTC meds w/o benefit & progressed w/ incr SOB prompting him to call EMS & go to the ER 1/7;  ER eval reviewed: congested w/ rhonchi on exam; T99.2 RR=21 O2sat=93%; CXR was clear & CBC- ok w/ WBC=10.4; treated w/ O2, NEB, IV fluid and improved...     Today  his exam shows mod cough, exp wheezing & rhonchi, no consolidation, thick yellow sput, etc; We discussed Acute on Chronic COPD and treatment w/ Levaquin, Depo80, Prednisone taper, Advair250, Mucinex w/ Fluids, Hycodan as needed etc... We will plan ROV recheck in 1 mo.    We reviewed prob list, meds, xrays and labs> see below for updates >>   ~  December 09, 2012:  6wk ROV & recheck> [name] responded nicely to our Rx w/ Pred, Levaquin, Advair250, Mucinex, etc;  He has since stopped all the meds including the Advair- we discussed this & the reason for the long term use of the Advair250- one inhalation Bid & he  agrees to continue; plus the Mucinex prn for thick phlegm & Proair as needed...    We reviewed his other medical issues today- BP controlled on meds; Cardiac stable & he gets pacer checks w/ DrKlein; Cerebrovasc f/u DrDickson yearly for Dopplers due this spring; needs to ret fasting for labs...  ~  June 08, 2013:  19mo ROV & [name] recently ret from a trip to [place] & had several inappropriate shocks from his AICD- checked by Cards (followed by Zannie Kehr) 7/14=> Twave oversensing & devise adjusted;  He had Treadmill test 7/14- no ischemic changes & no T wave oversensing but BP dropped therefore he had Myoview as well 7/14 intermed risk w/ large posterolat infarct, no ischemia, EF=26% (worse) w/ diffuse HK of posterolat wall;  He's had some weight gain he attrib to smoking cessation, BNP=156;  He had f/u 2DEcho & curiously this revealed mild LVH, focal basal hypertrophy, norm LVF w/ EF=60-65% & no regional wall motion abn, mild LA&RA dil => he has f/u DrHochrein soon... We reviewed the following medical problems during today's office visit >>     COPD> on Advair250Bid & Proair for prn use; doing better w/ smoking cessation...    HBP> on Coreg3.125Bid & Lisinopril5; BP= 102/60 & denies CP, palpit, dizzy/syncope, ch in SOB/DOE, edema...    ASHD/ Cardiomyopathy> on ASA 81mg /d; improved w/ BBlocker/ ACE rx, not requiring diuretic and denies CP/ angina, etc; followed by DrHochrein; Treadmill, Myoview, 2DEcho 7-8/14 reviewed & ?EF via Echo?    AICD w/ hx Twave oversensing 7/14 requiring AICD device adjustment by DrKlein...    Periph Vasc Dis> AAA repair 2000 by [facility], still too sedentary & he declined cardiac rehab; s/p R CAE w/ DPA 3/12 by DrCDickson; f/u CDoppler by [facility] 4/14 showed patent rightCAE site & <40% left ICA stenosis w/ calcif plaque; they plan regular follow up...    CHOL> on Simva40 + Fenofib160; FLP 8/14 shows TChol 122, TG 88, HDL 40, LDL 65; continue same...    GI> GERD, Divertics,  Polyps,etc>  GI reported stable on incr Fiber intake; his CC= gas pains & we rec Mylicon, Phazyme etc...    DJD, osteoporosis, compression fx>  Vit D level is low at 19 & rec to take Calcium, Men's formula MVI, & he never tool the 50K Rx; Rec- OTC 2000u daily...    DERM> he saw Derm re: his rash (?seb dermatitis) and itching; labs show 22% eos & rec to take Antihist eg Buel Ream, Zyrkek daily... We reviewed prob list, meds, xrays and labs> see below for updates >>  LABS 8/14:  FLP- at goals on Simva40+Feno160;  Chems- wnl;  CBC- wnl x Plat=112;  TSH=2.42;  VitD=19;  PSA=1.66...          Problem List:  COPD (ICD-496) - despite all efforts [name]  continues to smoke 3-4 cigars per day... he has min cough, some phlegm, but denies CP, SOB, wheezing, etc...  He doesn't want Chantix or help w/ smoking cessation;  He has a PROAIR inhaler for Prn use but he seldom uses it, & he knows to use the OTC MUCINEX 1-2 Bid w/ fluids for congestion. ~  CXR 3/12 in hosp for CAE showed COPD/E, biapical pleuroparenchymal scarring, NAD, AICD on left, old left rib fx, old T12 vertebroplasty... ~  Fall at home 4/12 w/ signif trauma & prob right rib fxs (CXR in ER showed some atelec but NAD & no rib films done). ~  1/14: presents w/ URI & acute on chr COPD exac; CXR in ER showed norm heart size, clear lungs, AICD in place, compression T12 w/ augmentation. ~  2/14: he responded nicely to Levaquin, Pred, Advair250, Mucinex, etc; asked to STAY on the [id number] Bid; he reports quitting the cigars! ~ 8/14: on Advair250Bid & Proair for prn use; doing better w/ smoking cessation...  HYPERTENSION (ICD-401.9) - back on COREG 3.125Bid & LISINOPRIL 5mg Qhs... Prev had to wean off these due to dizziness & post hypotension (resolved off Etoh). ~  12/11:  BP= 126/70 and doing well> denies HA, fatigue, visual changes, CP, palipit, dizziness, dyspnea, edema, etc; he has had postural hypotension & several syncopal episodes related to  this- now improved w/ the final adjustment in his meds. ~  4/12:  BP= 90/58 ==>100/60 recheck & he is weak; asked to monitor BP at home & may need to decr meds. ~  5/12:  BP= 130/70 supine & 100/60 sitting & standing (no symptoms at present)> he has been off the Lisinopril & Coreg for 1wk now. ~  7/12:  BP= 120/72 & 140/70 w/o postural changes (improved off etoh); he is back on Lisinopril 5mg  Qhs per Cards. ~  12/12:  BP= 128/74 on Coreg3.125Bid & Lisinopril5; denies CP, palpit, dizzy/syncope, ch in SOB/DOE, edema... ~  6/13:  BP= 122/68 & he remains largely asymptomatic... ~  1/14:  BP= 120/58 & he is here w/ acute on chr COPD exac; denies CP, palpit, etc... ~  8/14:  on Coreg3.125Bid & Lisinopril5; BP= 102/60 & denies CP, palpit, dizzy/syncope, ch in SOB/DOE, edema...  ATHEROSCLEROTIC HEART DISEASE (ICD-414.00) - on ASA 81mg /d + above meds... ISCHEMIC CARDIOMYOPATHY (ICD-414.8) Hx of SYNCOPE (ICD-780.2) & IMPLANTABLE DEFIBRILLATOR, DDD MDT (ICD-V45.02) ~  Cath in 2003 w/ 2 vessel CAD and stent placed in LAD...  ~  Cardiolite 1/08 w/ large inferolat infarct, no ischemia, EF=36%...  ~  2DEcho 2/08 w/ infer & post HK, EF=40%...  ~  recath 6/09 w/ heavily calcif vessels & 40% EF- DrHochrein has been following carefully and adjusting meds. ~  AICD placed 2009 by DrKlein for hx syncope & ischemic cardiomyopathy- followed by DrKlein yearly & doing satis. ~  2DEcho 4/10 showed mild LVH, mod reduced LVF w/ EF=40-45% w/ inferobasal & post HK, mild MR, paradoxical septal motion. ~  9/10: Cards tried to incr the Coreg to 9.375mg Bid but pt intol & went back to 6.25Bid. ~  8/11:  f/u by DrHochrein w/ recent syncopal episode & meds adjusted Coreg 3.125Bid & Lisinopril 5Bid. ~  Serial XRays have all revealed signif atherosclerotic changes diffusely (eg- lumbar films 4/12, CT neck 4/12 as well). ~  4/12> ER visit for fall at home w/ signif trauma> ?post BP related, ?med related, ?other etiology > Cards eval &  weaned off Coreg/ Lisinopril w/ resolution of postural hypotension. ~  7/12:  DrKlein restarted Lisin 5mg Qhs, BP improved, no further postural changes, f/u 2DEcho w/ EF=35-40% & Gr1DD... ~  12/12:  DrHochrein has him back on Coreg3.125Bid & Lisinopril5/d & stable overall... ~  7/13: he saw DrKlein w/ interrogation of his AICD showing some AFib episodes; he is considering anticoagulation but held off after discussion w/ DrHochrein... ~  7-8/14: on ASA, Coreg, Lisin; improved w/ BBlocker/ ACE rx, not requiring diuretic and denies CP/ angina, etc; followed by DrHochrein- Treadmill, Myoview, 2DEcho 7-8/14 reviewed & ?EF via Echo?  AICD w/ hx Twave oversensing 7/14 requiring AICD device adjustment by DrKlein...  CEREBROVASCULAR DISEASE PERIPHERAL VASCULAR DISEASE (ICD-443.9) - on ASA 81mg /d...  ~  He is s/p AAA repair 2000 by [facility]... he is sedentary and hasn't had ABI's checked... I will leave this to DrHochrein... ~  CDopplers 7/10, 1/1,1 & 8/11 showed stable mod carotid dis bilat w/ heavy calcif plaque & 60-79% bilat ICA stenoses... ~  CDopplers 2/12 w/ worsening RICA velocities c/w 80-99% stenosis (stable 60-79% LICA stenosis);  He was evaluated by [facility] DrDickson & underwent a right CAE w/ DPA 3/12 without complications;  He has been doing satis post-op & they plan f/u CDoppler in 62mo intervals going forward... ~  NOTE:  CT Brain 4/12 in ER showed mild to mod cortical vol loss & cbll atrophy, sm vessel dis, no acute changes... Prom vasc calcif seen on neck films & in abd... ~  CDopplers 4/13 by DrDickson showed patent right CAE site w/ mild plaque; 40-59% left ICA stenosis felt to be stable... ~  CDopplers 4/14 showed patent right CAE site w/ smooth plaque, and <40% left ICA stenosis but velocities are underest due to calcif plaque; felt to be stable...  HYPERCHOLESTEROLEMIA (ICD-272.0) - on SIMVASTATIN 40mg /d & FENOFIBRATE 160/d. ~  FLP 4/08 showed TChol 131, TG 100, HDL 41, LDL 70 ~  FLP 2/09  showed TChol 124, TG 132, HDL 37, LDL 61 ~  FLP 12/10 > pt never ret for FLP & insurance changed [facility] to [facility]. ~  FLP 12/11 showed TChol 112, TG 51, HDL 43, LDL 59 ~  4/12:  They report some difficult swallowing the Tricor capsule==> referred to GI for swallowing eval & switched to FENOFIBRATE 160mg /d. ~  FLP 12/12 on Simva40+Feno160 showed TChol 124, TG 78, HDL 37, LDL 72 ~  FLP 8/14 on Simva40+Feno160 showed TChol 122, TG 88, HDL 40, LDL 65; continue same...   GERD/ DYSPHAGIA> ~  4/12: pt noted some reflux symptoms & mild dysphagia for large capsule; Protonix 40mg /d started & refer to GI for eval; they also note weak voice & may need ENT eval if not resolved in follow up... ~  5/12:  Pt states all symptoms resolved on their own, didn't take PPI, didn't see GI, denies swallowing or voice issues now.  DIVERTICULOSIS OF COLON (ICD-562.10) > he takes fiber supplement daily. COLONIC POLYPS (ICD-211.3) HEMORRHOIDS (ICD-455.6) - last colonoscopy was 11/06 by DrPatterson showing divertics, several 1-81mm polyps (hyperplastic), and hems...  Hx of LIVER FUNCTION TESTS, ABNORMAL (ICD-794.8) - improved off etoh... ~  labs 2/09 showed SGOT= 30, SGPT= 17 ~  labs 12/11 showed SGOT= 38, SGPT= 19 ~  Labs 12/12 off all etoh & LFTs all wnl...  DEGENERATIVE JOINT DISEASE (ICD-715.90) Hx of GOUT (ICD-274.9) - on ALLOPURINOL 100mg /d... ~  labs 2/09 showed Uric= 3.3  OSTEOPOROSIS (ICD-733.00) - supposed to be on Caltrate, MVI, Vit D... he had T12 compression after syncopal spell 2009 w/ vertebroplasty by  DrDeveshwar...  BMD here 10/09 showed TScores +0.3 in Spine, & -2.7 in right FemNeck (Ortho eval by Janett Billow)... ~  labs 12/11 showed Vit D level = 22... rec to take Men's MVI + Vit D 2000 u daily. ~  Labs 12/12 showed Vit D level = 17; REC> start regular dosing of Calcium, Men's MVI, Vit D 5000u daily... ~  6/13: he never got the OTC Vit D so we will Rx w/ VitD 50K weekly Rx now...  DERMATITIS  (ICD-692.9) - he has eosinophilia & saw Derm w/ rx for topical cream;  rec to take antihist as well...  Health Maintenance -  ~  GI: colonoscopy 11/06 w/ several hyperplastic polyps removed... ~  GU: PSA 12/12 = 1.50 ~  Immunizations:  he tells me that he had PNEUMOVAX & 2010 Flu shot 10/10... received TETANUS shot here 2003...   Past Surgical History  Procedure Laterality Date  . Abdominal aortic aneurysm repair  2002    by Dr. Hart Rochester  . Cad stent  02/2002    Dr. Antoine Poche  . Aicd placed  03/2008    Dr. Graciela Husbands  . Right corotid enderectomy  12/2010    Dr. Edilia Bo  . Carotid endarterectomy  2010    Outpatient Encounter Prescriptions as of 06/08/2013  Medication Sig Dispense Refill  . allopurinol (ZYLOPRIM) 100 MG tablet TAKE 1 TABLET ONCE DAILY.  30 tablet  6  . aspirin 81 MG tablet Take 81 mg by mouth every morning.       . carvedilol (COREG) 3.125 MG tablet TAKE 1 TABLET TWICE DAILY WITH A MEAL.  60 tablet  2  . fenofibrate 160 MG tablet TAKE 1 TABLET DAILY.  30 tablet  6  . Fluticasone-Salmeterol (ADVAIR DISKUS) 250-50 MCG/DOSE AEPB Inhale 1 puff into the lungs 2 (two) times daily.  14 each  0  . Inulin (FIBERCHOICE PO) Take 2 tablets by mouth daily.       Marland Kitchen lisinopril (PRINIVIL,ZESTRIL) 5 MG tablet Take 1 tablet (5 mg total) by mouth at bedtime.  30 tablet  11  . Multiple Vitamins-Minerals (OCUVITE ADULT 50+ PO) Take 1 tablet by mouth daily.      . simvastatin (ZOCOR) 40 MG tablet TAKE ONE TABLET BY MOUTH AT BEDTIME  90 tablet  1   No facility-administered encounter medications on file as of 06/08/2013.    No Known Allergies   Current Medications, Allergies, Past Medical History, Past Surgical History, Family History, and Social History were reviewed in [employer] record.    Review of Systems         See HPI - all other systems neg except as noted... The patient complains of dyspnea on exertion, prolonged cough, and difficulty walking.  The patient  denies anorexia, fever, weight loss, weight gain, vision loss, decreased hearing, hoarseness, chest pain, syncope, peripheral edema, headaches, hemoptysis, abdominal pain, melena, hematochezia, severe indigestion/heartburn, hematuria, incontinence, muscle weakness, suspicious skin lesions, transient blindness, depression, unusual weight change, abnormal bleeding, enlarged lymph nodes, and angioedema.     Objective:   Physical Exam      WD, WN, 79 y/o WM > chr ill appearing but NAD... GENERAL:  Alert & oriented; pleasant & cooperative... HEENT:  [place]/AT, EOM-full, PERRLA, EACs-clear  NOSE-clear, THROAT-clear & wnl, Voice sounds back to norm NECK:  Supple w/ fairROM; no JVD; prominent carotid impulses, scar on right, + bruits; no thyromegaly or nodules palpated; no lymphadenopathy... CHEST:  bilat rhonchi at bases & end-exp wheezing,  no rales, no signs of consolidation... HEART:  Regular Rhythm; gr 1/6 SEM, S4, no rubs... ABDOMEN:  Min Bruise in right flank, mild tender lower ribs; normal bowel sounds; no organomegaly or masses detected. EXT: without deformities, mild arthritic changes; no varicose veins/ +venous insuffic/ no edema. NEURO: no focal neuro deficits... DERM:  dry skin dermatitis, seborrhea, rosacea...  RADIOLOGY DATA:  Reviewed in the EPIC EMR & discussed w/ the patient...  LABORATORY DATA:  Reviewed in the EPIC EMR & discussed w/ the patient...   Assessment & Plan:    COPD>  he is congratulated on quitting smoking completely; Advised to stay on the Advair Bid.  Hx HBP & Hx Postural Hypotension>  BP on low side & tolerating Coreg 3.125Bid + Lisinopril 5mg  Qhs; no postural BP changes noted today & his dizziness has resolved; he notes all symptoms improved off etoh...  ASHD/ Cardiomyop/ AICD>  Followed by DHochrein & Graciela Husbands;  Continue current meds & f/u Treadmill, Myoview, 2DEcho...  Periph Vasc Dis>  Followed by [facility], DrDickson & CDoppler 9/12 showed patent right CAE site w/  40-59% left ICA stenosis & they are following...  CHOL>  FLP looks good on simva40 & Feno160, continue same...  GI/ Dysphagia>  He notes symptoms resolved spont & he does not believe that he has a problem in this area, declines PPI Rx or GI eval...  FALL 4/12 w/ signif trauma>  Hx of trauma to right chest wall/ flank/ etc... Improved w/ rest, heat, rib binder, pain meds, IS, Proair, Mucinex, & Pred... More mobile & feeling better; He is off the prev Vicodin etc;  Note compression fx, osteopenia, low vit d level==> he needs as a minimum Calcium/ Men's MVI/ Vit D 2000u daily...  Other medical issues as noted...   Patient's Medications  New Prescriptions   No medications on file  Previous Medications   ALLOPURINOL (ZYLOPRIM) 100 MG TABLET    TAKE 1 TABLET ONCE DAILY.   ASPIRIN 81 MG TABLET    Take 81 mg by mouth every morning.    CARVEDILOL (COREG) 3.125 MG TABLET    TAKE 1 TABLET TWICE DAILY WITH A MEAL.   FENOFIBRATE 160 MG TABLET    TAKE 1 TABLET DAILY.   FLUTICASONE-SALMETEROL (ADVAIR DISKUS) 250-50 MCG/DOSE AEPB    Inhale 1 puff into the lungs 2 (two) times daily.   INULIN (FIBERCHOICE PO)    Take 2 tablets by mouth daily.    LISINOPRIL (PRINIVIL,ZESTRIL) 5 MG TABLET    Take 1 tablet (5 mg total) by mouth at bedtime.   MULTIPLE VITAMINS-MINERALS (OCUVITE ADULT 50+ PO)    Take 1 tablet by mouth daily.   SIMVASTATIN (ZOCOR) 40 MG TABLET    TAKE ONE TABLET BY MOUTH AT BEDTIME  Modified Medications   No medications on file  Discontinued Medications   No medications on file

## 2013-06-08 NOTE — Patient Instructions (Addendum)
Today we updated your med list in our EPIC system...    Continue your current medications the same...  Please return to our lab one morning this week for your FASTING blood work...    We will contact you w/ the results when available...   Stay as active as possible...  Call for any questions...  Let's plan a follow up visit in 63mo, sooner if needed for problems.Marland KitchenMarland Kitchen

## 2013-06-08 NOTE — Telephone Encounter (Signed)
Message copied by Tarri Fuller on Mon Jun 08, 2013  9:54 AM ------      Message from: [place], [place] T      Created: Sun Jun 07, 2013  5:59 AM       EF improved to normal      Continue with current treatment plan.      Tereso Newcomer, PA-C        06/07/2013 5:59 AM ------

## 2013-06-09 ENCOUNTER — Other Ambulatory Visit (INDEPENDENT_AMBULATORY_CARE_PROVIDER_SITE_OTHER): Payer: Medicare Other

## 2013-06-09 DIAGNOSIS — F419 Anxiety disorder, unspecified: Secondary | ICD-10-CM

## 2013-06-09 DIAGNOSIS — E7800 Pure hypercholesterolemia, unspecified: Secondary | ICD-10-CM | POA: Diagnosis not present

## 2013-06-09 DIAGNOSIS — I10 Essential (primary) hypertension: Secondary | ICD-10-CM | POA: Diagnosis not present

## 2013-06-09 DIAGNOSIS — I2510 Atherosclerotic heart disease of native coronary artery without angina pectoris: Secondary | ICD-10-CM | POA: Diagnosis not present

## 2013-06-09 DIAGNOSIS — E559 Vitamin D deficiency, unspecified: Secondary | ICD-10-CM | POA: Diagnosis not present

## 2013-06-09 DIAGNOSIS — N320 Bladder-neck obstruction: Secondary | ICD-10-CM | POA: Diagnosis not present

## 2013-06-09 DIAGNOSIS — F411 Generalized anxiety disorder: Secondary | ICD-10-CM | POA: Diagnosis not present

## 2013-06-09 LAB — CBC WITH DIFFERENTIAL/PLATELET
Eosinophils Absolute: 1.1 10*3/uL — ABNORMAL HIGH (ref 0.0–0.7)
Lymphs Abs: 1.6 10*3/uL (ref 0.7–4.0)
MCHC: 34.7 g/dL (ref 30.0–36.0)
MCV: 101.6 fl — ABNORMAL HIGH (ref 78.0–100.0)
Monocytes Absolute: 0.5 10*3/uL (ref 0.1–1.0)
Neutrophils Relative %: 55 % (ref 43.0–77.0)
Platelets: 112 10*3/uL — ABNORMAL LOW (ref 150.0–400.0)

## 2013-06-09 LAB — HEPATIC FUNCTION PANEL
ALT: 18 U/L (ref 0–53)
AST: 31 U/L (ref 0–37)
Alkaline Phosphatase: 28 U/L — ABNORMAL LOW (ref 39–117)
Bilirubin, Direct: 0.2 mg/dL (ref 0.0–0.3)
Total Bilirubin: 0.8 mg/dL (ref 0.3–1.2)

## 2013-06-09 LAB — TSH: TSH: 2.42 u[IU]/mL (ref 0.35–5.50)

## 2013-06-09 LAB — BASIC METABOLIC PANEL
Chloride: 105 mEq/L (ref 96–112)
Potassium: 5.1 mEq/L (ref 3.5–5.1)
Sodium: 136 mEq/L (ref 135–145)

## 2013-06-09 LAB — LIPID PANEL
Cholesterol: 122 mg/dL (ref 0–200)
LDL Cholesterol: 65 mg/dL (ref 0–99)
Triglycerides: 88 mg/dL (ref 0.0–149.0)
VLDL: 17.6 mg/dL (ref 0.0–40.0)

## 2013-06-10 LAB — VITAMIN D 25 HYDROXY (VIT D DEFICIENCY, FRACTURES): Vit D, 25-Hydroxy: 19 ng/mL — ABNORMAL LOW (ref 30–89)

## 2013-06-11 ENCOUNTER — Telehealth: Payer: Self-pay | Admitting: Pulmonary Disease

## 2013-06-11 NOTE — Telephone Encounter (Signed)
Notes Recorded by Michele Mcalpine, MD on 06/10/2013 at 8:34 AM Please notify patient>  FLP looks good on Simva40+Feno160; continue same! Chems. LFTs, CBC, Thyroid, PSA> all essentially wnl, & look good... VitD level is low & I rec> one men's formula MVI daily + one Vit D supplement of 2000u daily... ---  I spoke with patient about results and he verbalized understanding and had no questions

## 2013-06-15 DIAGNOSIS — Z85828 Personal history of other malignant neoplasm of skin: Secondary | ICD-10-CM | POA: Diagnosis not present

## 2013-06-15 DIAGNOSIS — L708 Other acne: Secondary | ICD-10-CM | POA: Diagnosis not present

## 2013-06-15 DIAGNOSIS — D235 Other benign neoplasm of skin of trunk: Secondary | ICD-10-CM | POA: Diagnosis not present

## 2013-06-16 ENCOUNTER — Other Ambulatory Visit: Payer: Self-pay | Admitting: Pulmonary Disease

## 2013-06-30 ENCOUNTER — Other Ambulatory Visit: Payer: Self-pay

## 2013-06-30 MED ORDER — CARVEDILOL 3.125 MG PO TABS: ORAL_TABLET | ORAL | Status: DC

## 2013-07-07 ENCOUNTER — Ambulatory Visit (INDEPENDENT_AMBULATORY_CARE_PROVIDER_SITE_OTHER): Payer: Medicare Other

## 2013-07-07 DIAGNOSIS — Z23 Encounter for immunization: Secondary | ICD-10-CM | POA: Diagnosis not present

## 2013-07-15 DIAGNOSIS — H353190 Nonexudative age-related macular degeneration, unspecified eye, stage unspecified: Secondary | ICD-10-CM | POA: Diagnosis not present

## 2013-07-20 DIAGNOSIS — H353190 Nonexudative age-related macular degeneration, unspecified eye, stage unspecified: Secondary | ICD-10-CM | POA: Diagnosis not present

## 2013-07-20 DIAGNOSIS — H33319 Horseshoe tear of retina without detachment, unspecified eye: Secondary | ICD-10-CM | POA: Diagnosis not present

## 2013-07-20 DIAGNOSIS — H35379 Puckering of macula, unspecified eye: Secondary | ICD-10-CM | POA: Diagnosis not present

## 2013-07-30 DIAGNOSIS — H33319 Horseshoe tear of retina without detachment, unspecified eye: Secondary | ICD-10-CM | POA: Diagnosis not present

## 2013-08-03 ENCOUNTER — Ambulatory Visit: Payer: Medicare Other | Admitting: Cardiology

## 2013-08-04 ENCOUNTER — Encounter: Payer: Self-pay | Admitting: Cardiology

## 2013-08-04 ENCOUNTER — Ambulatory Visit (INDEPENDENT_AMBULATORY_CARE_PROVIDER_SITE_OTHER): Payer: Medicare Other | Admitting: Cardiology

## 2013-08-04 VITALS — BP 144/76 | HR 73 | Ht 71.0 in | Wt 192.0 lb

## 2013-08-04 DIAGNOSIS — I2589 Other forms of chronic ischemic heart disease: Secondary | ICD-10-CM

## 2013-08-04 NOTE — Patient Instructions (Signed)
The current medical regimen is effective;  continue present plan and medications.  Follow up in 6 months with Dr Hochrein.  You will receive a letter in the mail 2 months before you are due.  Please call us when you receive this letter to schedule your follow up appointment.  

## 2013-08-04 NOTE — Progress Notes (Signed)
HPI The patient has had followup since he had a firing of his defibrillator. He had a slightly abnormal exercise treadmill test and then was followed up with a Lexiscan Myoview 05/21/13: Intermediate risk, large posterior lateral infarct, no ischemia, EF 26%. It was noted that the patient had gained a significant amount of weight over the last several months. He was therefore sent up for follow up today.   Since he was last seen he has done well. He did gain weight when he stop smoking cigarettes earlier this year. However, he's had no further firings of his defibrillator. He denies any chest pressure, neck or arm discomfort  He has had no shortness of breath, PND or orthopnea. He has had no palpitations, presyncope or syncope.  He has had no edema.   He has had occasional sharp fleeting chest pain under his left chest but he associates this with foods   No Known Allergies  Current Outpatient Prescriptions  Medication Sig Dispense Refill  . ADVAIR DISKUS 250-50 MCG/DOSE AEPB INHALE 1 PUFF TWICE DAILY.  60 each  6  . allopurinol (ZYLOPRIM) 100 MG tablet TAKE 1 TABLET ONCE DAILY.  30 tablet  6  . aspirin 81 MG tablet Take 81 mg by mouth every morning.       . carvedilol (COREG) 3.125 MG tablet TAKE 1 TABLET TWICE DAILY WITH A MEAL.  60 tablet  2  . fenofibrate 160 MG tablet TAKE 1 TABLET DAILY.  30 tablet  6  . Inulin (FIBERCHOICE PO) Take 2 tablets by mouth daily.       Marland Kitchen lisinopril (PRINIVIL,ZESTRIL) 5 MG tablet Take 1 tablet (5 mg total) by mouth at bedtime.  30 tablet  11  . Multiple Vitamins-Minerals (VISION PLUS PO) Take 1 tablet by mouth 2 (two) times daily. Took the place of Ocuvite      . simvastatin (ZOCOR) 40 MG tablet TAKE ONE TABLET AT BEDTIME.  90 tablet  2  . VITAMIN D, CHOLECALCIFEROL, PO Take 2 capsules by mouth daily.       No current facility-administered medications for this visit.    Past Medical History  Diagnosis Date  . Allergic rhinitis   . COPD (chronic obstructive  pulmonary disease)   . Tobacco abuse   . Hypertension   . Atherosclerotic heart disease   . Ischemic cardiomyopathy     EF improved to normal: Echo 8/14:  Mild LVH, focal basal hypertrophy, EF 60-65%, normal wall motion, mild BAE, PASP 36  . Syncope   . Presence of cardiac defibrillator   . Peripheral vascular disease   . Hypercholesterolemia   . Diverticulosis of colon   . History of colonic polyps   . Hemorrhoids   . Abnormal liver function tests   . DJD (degenerative joint disease)   . History of gout   . Back pain   . Compression fracture   . Osteoporosis   . Dermatitis     Past Surgical History  Procedure Laterality Date  . Abdominal aortic aneurysm repair  2002    by Dr. Hart Rochester  . Cad stent  02/2002    Dr. Antoine Poche  . Aicd placed  03/2008    Dr. Graciela Husbands  . Right corotid enderectomy  12/2010    Dr. Edilia Bo  . Carotid endarterectomy  2010    ROS: Left foot pain.  Otherwise as stated in the HPI and negative for all other systems.  PHYSICAL EXAM BP 144/76  Pulse 73  Ht 5\' 11"  (  1.803 m)  Wt 192 lb (87.091 kg)  BMI 26.79 kg/m2 GENERAL:  Gaunt appearing, no distress HEENT: Pupil reactive, fundi not visualized, oral mucosa unremarkable NECK:  No jugular venous distention, waveform within normal limits, carotid upstroke brisk and symmetric, no bruits, no thyromegaly LYMPHATICS:  No cervical, inguinal adenopathy LUNGS:  Clear to auscultation bilaterally BACK:  No CVA tenderness CHEST:  ICD pocket intact HEART:  PMI not displaced or sustained,S1 and S2 within normal limits, no S3, no S4, no clicks, no rubs, no murmurs ABD:  Flat, positive bowel sounds normal in frequency in pitch, no bruits, no rebound, no guarding, no midline pulsatile mass, no hepatomegaly, no splenomegaly EXT:  2 plus pulses throughout, no edema, no cyanosis no clubbing SKIN:  No rashes no nodules NEURO:  Cranial nerves II through XII grossly intact, motor grossly intact throughout PSYCH:  Cognitively  intact, oriented to person place and time;  ASSESSMENT AND PLAN  CARDIOMYOPATHY:  He seems to be euvolemic.  No change in therapy is indicated.   No further imaging is indicated.   Of note his last EF on Cardiolite was about 26%. However, he was 45% by echo which I suspect is correct.  TOBACCO USE:  He quit smoking!  CAROTID STENOSIS:  He has follow up with Dr. Edilia Bo  HTN:   The blood pressure is only mildly elevated today.  He will continue with the current therapies.   CAD:   He has no high-risk findings on the recent stress test. I did review the catheterization in the distant past. No further cardiovascular testing is suggested.  ICD:   He had followup as above and has had no more events. No further change in therapy is indicated.

## 2013-08-07 ENCOUNTER — Ambulatory Visit: Payer: Medicare Other | Admitting: Cardiology

## 2013-08-14 ENCOUNTER — Encounter: Payer: Self-pay | Admitting: Internal Medicine

## 2013-08-14 ENCOUNTER — Ambulatory Visit (INDEPENDENT_AMBULATORY_CARE_PROVIDER_SITE_OTHER): Payer: Medicare Other | Admitting: Internal Medicine

## 2013-08-14 VITALS — BP 154/83 | HR 64 | Ht 72.0 in | Wt 192.9 lb

## 2013-08-14 DIAGNOSIS — I4891 Unspecified atrial fibrillation: Secondary | ICD-10-CM | POA: Diagnosis not present

## 2013-08-14 DIAGNOSIS — Z95810 Presence of automatic (implantable) cardiac defibrillator: Secondary | ICD-10-CM | POA: Diagnosis not present

## 2013-08-14 DIAGNOSIS — T82198A Other mechanical complication of other cardiac electronic device, initial encounter: Secondary | ICD-10-CM | POA: Insufficient documentation

## 2013-08-14 DIAGNOSIS — I2589 Other forms of chronic ischemic heart disease: Secondary | ICD-10-CM

## 2013-08-14 DIAGNOSIS — E7800 Pure hypercholesterolemia, unspecified: Secondary | ICD-10-CM | POA: Diagnosis not present

## 2013-08-14 DIAGNOSIS — R55 Syncope and collapse: Secondary | ICD-10-CM

## 2013-08-14 LAB — ICD DEVICE OBSERVATION
AL AMPLITUDE: 2.4 mv
AL THRESHOLD: 1 V
ATRIAL PACING ICD: 24.6 pct
BAMS-0001: 170 {beats}/min
CHARGE TIME: 8.8 s
DEV-0020ICD: NEGATIVE
RV LEAD AMPLITUDE: 1.6 mv
RV LEAD THRESHOLD: 0.75 V
TZAT-0001SLOWVT: 1
TZAT-0002FASTVT: NEGATIVE
TZAT-0002SLOWVT: NEGATIVE
TZAT-0012FASTVT: 200 ms
TZAT-0018FASTVT: NEGATIVE
TZAT-0018SLOWVT: NEGATIVE
TZAT-0019FASTVT: 8 V
TZAT-0020FASTVT: 1.5 ms
TZON-0004VSLOWVT: 28
TZON-0005SLOWVT: 12
TZST-0001FASTVT: 3
TZST-0001FASTVT: 4
TZST-0001FASTVT: 5
TZST-0001SLOWVT: 3
TZST-0001SLOWVT: 4
TZST-0001SLOWVT: 5
TZST-0001SLOWVT: 6
TZST-0002FASTVT: NEGATIVE
TZST-0002FASTVT: NEGATIVE
TZST-0002FASTVT: NEGATIVE
TZST-0002SLOWVT: NEGATIVE
TZST-0002SLOWVT: NEGATIVE
VENTRICULAR PACING ICD: 0.1 pct

## 2013-08-14 NOTE — Assessment & Plan Note (Signed)
The patient's device was interrogated.  The information was reviewed. No changes were made in the programming.    

## 2013-08-14 NOTE — Progress Notes (Signed)
Patient Care Team: Michele Mcalpine, MD as PCP - General (Pulmonary Disease)   HPI  [name] is a 79 y.o. male seen in followup for ischemic heart disease modest depression of LV function with some interval improvement with ejection fraction of 40-45% by echo in April2011; he status post ICD implantation for primary prevention.   His he was shocked in June for T wave over sensing. His device was reprogrammed changing the sensitivity from 0.3--0.6. I should also note there has been intercurrent loss of R-wave amplitude from 5--3.5 mV      Past Medical History  Diagnosis Date  . Allergic rhinitis   . COPD (chronic obstructive pulmonary disease)   . Tobacco abuse   . Hypertension   . Atherosclerotic heart disease   . Ischemic cardiomyopathy     EF improved to normal: Echo 8/14:  Mild LVH, focal basal hypertrophy, EF 60-65%, normal wall motion, mild BAE, PASP 36  . Syncope   . Presence of cardiac defibrillator   . Peripheral vascular disease   . Hypercholesterolemia   . Diverticulosis of colon   . History of colonic polyps   . Hemorrhoids   . Abnormal liver function tests   . DJD (degenerative joint disease)   . History of gout   . Back pain   . Compression fracture   . Osteoporosis   . Dermatitis     Past Surgical History  Procedure Laterality Date  . Abdominal aortic aneurysm repair  2002    by Dr. Hart Rochester  . Cad stent  02/2002    Dr. Antoine Poche  . Aicd placed  03/2008    Dr. Graciela Husbands  . Right corotid enderectomy  12/2010    Dr. Edilia Bo  . Carotid endarterectomy  2010    Current Outpatient Prescriptions  Medication Sig Dispense Refill  . ADVAIR DISKUS 250-50 MCG/DOSE AEPB INHALE 1 PUFF TWICE DAILY.  60 each  6  . allopurinol (ZYLOPRIM) 100 MG tablet TAKE 1 TABLET ONCE DAILY.  30 tablet  6  . aspirin 81 MG tablet Take 81 mg by mouth every morning.       . carvedilol (COREG) 3.125 MG tablet TAKE 1 TABLET TWICE DAILY WITH A MEAL.  60 tablet  2  . fenofibrate 160  MG tablet TAKE 1 TABLET DAILY.  30 tablet  6  . Inulin (FIBERCHOICE PO) Take 2 tablets by mouth daily.       Marland Kitchen lisinopril (PRINIVIL,ZESTRIL) 5 MG tablet Take 1 tablet (5 mg total) by mouth at bedtime.  30 tablet  11  . Multiple Vitamins-Minerals (VISION PLUS PO) Take 1 tablet by mouth 2 (two) times daily. Took the place of Ocuvite      . simvastatin (ZOCOR) 40 MG tablet TAKE ONE TABLET AT BEDTIME.  90 tablet  2  . VITAMIN D, CHOLECALCIFEROL, PO Take 2 capsules by mouth daily.       No current facility-administered medications for this visit.    No Known Allergies  Review of Systems negative except from HPI and PMH  Physical Exam BP 154/83  Pulse 64  Ht 6' (1.829 m)  Wt 192 lb 13.9 oz (87.485 kg)  BMI 26.15 kg/m2 Well developed and nourished in no acute distress HENT normal Neck supple with JVP-flat Clear Device pocket well healed; without hematoma or erythema.  There is no tethering  Regular rate and rhythm, no murmurs or gallops Abd-soft with active BS No Clubbing cyanosis edema Skin-warm and dry  senile keratosos A & Oriented  Grossly normal sensory and motor function     Assessment and  Plan

## 2013-08-14 NOTE — Assessment & Plan Note (Signed)
conintue current meds incl asa On intermediate dose statin,  Reasonable to to continue with an LDL of 62. Realizing that this is controversial.

## 2013-08-14 NOTE — Assessment & Plan Note (Signed)
No intercurrent afib  Only on asa

## 2013-08-14 NOTE — Assessment & Plan Note (Signed)
Device was reprogrammed and submitted for treadmill testing with no T wave over sensing of 0.6 mV. There is a modest diminution in R-wave amplitude from 5-->3.5-9 mV

## 2013-08-14 NOTE — Assessment & Plan Note (Signed)
As above.

## 2013-08-14 NOTE — Patient Instructions (Signed)
Remote monitoring is used to monitor your Pacemaker of ICD from home. This monitoring reduces the number of office visits required to check your device to one time per year. It allows [place] to keep an eye on the functioning of your device to ensure it is working properly. You are scheduled for a device check from home on 11-13-2013. You may send your transmission at any time that day. If you have a wireless device, the transmission will be sent automatically. After your physician reviews your transmission, you will receive a postcard with your next transmission date.   Your physician wants you to follow-up in: ONE YEAR WITH DR Logan Bores will receive a reminder letter in the mail two months in advance. If you don't receive a letter, please call our office to schedule the follow-up appointment.

## 2013-08-25 ENCOUNTER — Ambulatory Visit: Payer: Medicare Other | Admitting: Cardiology

## 2013-09-10 ENCOUNTER — Other Ambulatory Visit: Payer: Self-pay | Admitting: Pulmonary Disease

## 2013-09-23 ENCOUNTER — Other Ambulatory Visit: Payer: Self-pay | Admitting: Pulmonary Disease

## 2013-09-28 ENCOUNTER — Other Ambulatory Visit: Payer: Self-pay

## 2013-09-28 MED ORDER — CARVEDILOL 3.125 MG PO TABS: ORAL_TABLET | ORAL | Status: DC

## 2013-10-19 ENCOUNTER — Other Ambulatory Visit: Payer: Self-pay | Admitting: Pulmonary Disease

## 2013-11-13 ENCOUNTER — Ambulatory Visit (INDEPENDENT_AMBULATORY_CARE_PROVIDER_SITE_OTHER): Payer: Medicare Other | Admitting: *Deleted

## 2013-11-13 DIAGNOSIS — I4891 Unspecified atrial fibrillation: Secondary | ICD-10-CM

## 2013-11-14 LAB — MDC_IDC_ENUM_SESS_TYPE_REMOTE
Brady Statistic AP VP Percent: 0 %
Brady Statistic AS VP Percent: 0.01 %
Brady Statistic AS VS Percent: 78.91 %
Brady Statistic RV Percent Paced: 0.01 %
Date Time Interrogation Session: 20150123093626
HIGH POWER IMPEDANCE MEASURED VALUE: 63 Ohm
HighPow Impedance: 49 Ohm
Lead Channel Impedance Value: 475 Ohm
Lead Channel Impedance Value: 494 Ohm
Lead Channel Sensing Intrinsic Amplitude: 2.75 mV
Lead Channel Sensing Intrinsic Amplitude: 2.75 mV
Lead Channel Sensing Intrinsic Amplitude: 4.5 mV
Lead Channel Setting Pacing Pulse Width: 0.4 ms
MDC IDC MSMT BATTERY VOLTAGE: 2.87 V
MDC IDC MSMT LEADCHNL RV SENSING INTR AMPL: 4.5 mV
MDC IDC SET LEADCHNL RA PACING AMPLITUDE: 2 V
MDC IDC SET LEADCHNL RV PACING AMPLITUDE: 2.5 V
MDC IDC SET LEADCHNL RV SENSING SENSITIVITY: 0.6 mV
MDC IDC SET ZONE DETECTION INTERVAL: 350 ms
MDC IDC SET ZONE DETECTION INTERVAL: 360 ms
MDC IDC SET ZONE DETECTION INTERVAL: 400 ms
MDC IDC STAT BRADY AP VS PERCENT: 21.08 %
MDC IDC STAT BRADY RA PERCENT PACED: 21.09 %
Zone Setting Detection Interval: 260 ms
Zone Setting Detection Interval: 300 ms

## 2013-11-25 ENCOUNTER — Telehealth: Payer: Self-pay | Admitting: Pulmonary Disease

## 2013-11-25 MED ORDER — OSELTAMIVIR PHOSPHATE 75 MG PO CAPS: 75.0000 mg | ORAL_CAPSULE | Freq: Two times a day (BID) | ORAL | Status: DC

## 2013-11-25 NOTE — Telephone Encounter (Signed)
Called and spoke with spouse. She reports they think he has the flu. C/o chills, ? Fever (not sure), body aches. Denies any cough, no nausea, no vomiting. Spouse wants pt seen today bc pt had terrible night last night. Nothing available. Please advise SN thanks

## 2013-11-25 NOTE — Telephone Encounter (Signed)
Spoke with spouse. Aware of recs. Nothing further needed

## 2013-11-25 NOTE — Telephone Encounter (Signed)
Per SN---  tamiflu 75 mg  #10  1 po bid tyelnol per bottle instructions Increase fluids mucinex 1-2 po bid of the 600 mg   Delsym otc  2 tsp bid

## 2013-11-26 ENCOUNTER — Telehealth: Payer: Self-pay | Admitting: Internal Medicine

## 2013-11-26 NOTE — Telephone Encounter (Signed)
Spoke w/daughter---no one for device clinic called for pt/kwm

## 2013-11-26 NOTE — Telephone Encounter (Signed)
New Problem:  Pt is wanting to know if something was wrong with his January transmission. Pt believes someone from the device clinic called him yesterday.

## 2013-12-01 ENCOUNTER — Encounter: Payer: Self-pay | Admitting: *Deleted

## 2013-12-02 ENCOUNTER — Telehealth: Payer: Self-pay | Admitting: *Deleted

## 2013-12-02 ENCOUNTER — Telehealth: Payer: Self-pay | Admitting: Pulmonary Disease

## 2013-12-02 MED ORDER — PREDNISONE (PAK) 5 MG PO TABS: ORAL_TABLET | ORAL | Status: DC

## 2013-12-02 MED ORDER — LEVOFLOXACIN 500 MG PO TABS: 500.0000 mg | ORAL_TABLET | Freq: Every day | ORAL | Status: DC

## 2013-12-02 NOTE — Telephone Encounter (Signed)
lmomtcb x1 

## 2013-12-02 NOTE — Telephone Encounter (Signed)
Per SN---  This sounds like COPD exacerbation  levaquin 500mg   #7   1 daily Align once daily Increase fluids Rest at home mucinex 600 mg  2 po bid pred taper  5mg   6 day pack Tylenol every 4-6 hours as needed

## 2013-12-02 NOTE — Telephone Encounter (Signed)
Called and spoke with spouse.a ware of recs. Nothing further needed

## 2013-12-02 NOTE — Telephone Encounter (Signed)
Spoke with daugther. Made her aware of recs from earlier. Nothing further needed

## 2013-12-02 NOTE — Telephone Encounter (Signed)
COPD patient: Pt called in on 11-25-13 and was given tamiflu. Daughter states he has finished all of this x 2 days ago but he is still not feeling well. She states he has a productive cough with yellow phlegm, wheezing, low grade fever on and off, weakness, fatigue, and increased SOB. He has not mentioned any chest tightness. She states he just feels lousy. She is concerned that he keep getting the low grade fevers. Please advise. Charma Igo, CMA No Known Allergies  Current Outpatient Prescriptions on File Prior to Visit  Medication Sig Dispense Refill  . ADVAIR DISKUS 250-50 MCG/DOSE AEPB INHALE 1 PUFF TWICE DAILY.  60 each  6  . allopurinol (ZYLOPRIM) 100 MG tablet TAKE 1 TABLET ONCE DAILY.  30 tablet  6  . aspirin 81 MG tablet Take 81 mg by mouth every morning.       . carvedilol (COREG) 3.125 MG tablet TAKE 1 TABLET TWICE DAILY WITH A MEAL.  60 tablet  6  . fenofibrate 160 MG tablet TAKE 1 TABLET DAILY.  30 tablet  6  . Inulin (FIBERCHOICE PO) Take 2 tablets by mouth daily.       Marland Kitchen lisinopril (PRINIVIL,ZESTRIL) 5 MG tablet TAKE 1 TABLET ONCE DAILY.  30 tablet  6  . Multiple Vitamins-Minerals (VISION PLUS PO) Take 1 tablet by mouth 2 (two) times daily. Took the place of Ocuvite      . oseltamivir (TAMIFLU) 75 MG capsule Take 1 capsule (75 mg total) by mouth 2 (two) times daily.  10 capsule  0  . simvastatin (ZOCOR) 40 MG tablet TAKE ONE TABLET AT BEDTIME.  90 tablet  2  . VITAMIN D, CHOLECALCIFEROL, PO Take 2 capsules by mouth daily.       No current facility-administered medications on file prior to visit.

## 2013-12-03 DIAGNOSIS — H35379 Puckering of macula, unspecified eye: Secondary | ICD-10-CM | POA: Diagnosis not present

## 2013-12-03 DIAGNOSIS — H31099 Other chorioretinal scars, unspecified eye: Secondary | ICD-10-CM | POA: Diagnosis not present

## 2013-12-03 DIAGNOSIS — H353190 Nonexudative age-related macular degeneration, unspecified eye, stage unspecified: Secondary | ICD-10-CM | POA: Diagnosis not present

## 2013-12-08 ENCOUNTER — Ambulatory Visit: Payer: Medicare Other | Admitting: Pulmonary Disease

## 2013-12-09 ENCOUNTER — Encounter: Payer: Self-pay | Admitting: Internal Medicine

## 2013-12-23 ENCOUNTER — Other Ambulatory Visit: Payer: Self-pay | Admitting: Pulmonary Disease

## 2013-12-23 ENCOUNTER — Ambulatory Visit (INDEPENDENT_AMBULATORY_CARE_PROVIDER_SITE_OTHER)
Admission: RE | Admit: 2013-12-23 | Discharge: 2013-12-23 | Disposition: A | Discharge status: Home / Self Care | Payer: Medicare Other | Source: Ambulatory Visit | Attending: Pulmonary Disease | Admitting: Pulmonary Disease

## 2013-12-23 DIAGNOSIS — M549 Dorsalgia, unspecified: Secondary | ICD-10-CM

## 2013-12-23 DIAGNOSIS — M545 Low back pain, unspecified: Secondary | ICD-10-CM | POA: Diagnosis not present

## 2013-12-23 DIAGNOSIS — IMO0002 Reserved for concepts with insufficient information to code with codable children: Secondary | ICD-10-CM | POA: Diagnosis not present

## 2014-01-20 ENCOUNTER — Other Ambulatory Visit: Payer: Self-pay | Admitting: Pulmonary Disease

## 2014-02-15 ENCOUNTER — Ambulatory Visit (INDEPENDENT_AMBULATORY_CARE_PROVIDER_SITE_OTHER): Payer: Medicare Other | Admitting: *Deleted

## 2014-02-15 ENCOUNTER — Encounter: Payer: Self-pay | Admitting: Internal Medicine

## 2014-02-15 DIAGNOSIS — I2589 Other forms of chronic ischemic heart disease: Secondary | ICD-10-CM | POA: Diagnosis not present

## 2014-02-15 DIAGNOSIS — Z95810 Presence of automatic (implantable) cardiac defibrillator: Secondary | ICD-10-CM | POA: Diagnosis not present

## 2014-02-15 LAB — MDC_IDC_ENUM_SESS_TYPE_REMOTE
Battery Voltage: 2.83 V
Brady Statistic AP VS Percent: 23.01 %
Brady Statistic AS VS Percent: 76.98 %
Date Time Interrogation Session: 20150427052506
HIGH POWER IMPEDANCE MEASURED VALUE: 67 Ohm
HighPow Impedance: 49 Ohm
Lead Channel Sensing Intrinsic Amplitude: 4.125 mV
Lead Channel Setting Pacing Amplitude: 2 V
Lead Channel Setting Pacing Amplitude: 2.5 V
Lead Channel Setting Sensing Sensitivity: 0.6 mV
MDC IDC MSMT LEADCHNL RA IMPEDANCE VALUE: 494 Ohm
MDC IDC MSMT LEADCHNL RA SENSING INTR AMPL: 2.75 mV
MDC IDC MSMT LEADCHNL RV IMPEDANCE VALUE: 494 Ohm
MDC IDC SET LEADCHNL RV PACING PULSEWIDTH: 0.4 ms
MDC IDC SET ZONE DETECTION INTERVAL: 300 ms
MDC IDC STAT BRADY AP VP PERCENT: 0 %
MDC IDC STAT BRADY AS VP PERCENT: 0 %
MDC IDC STAT BRADY RA PERCENT PACED: 23.01 %
MDC IDC STAT BRADY RV PERCENT PACED: 0 %
Zone Setting Detection Interval: 260 ms
Zone Setting Detection Interval: 350 ms
Zone Setting Detection Interval: 360 ms
Zone Setting Detection Interval: 400 ms

## 2014-02-19 ENCOUNTER — Encounter: Payer: Self-pay | Admitting: Cardiology

## 2014-02-19 ENCOUNTER — Ambulatory Visit (INDEPENDENT_AMBULATORY_CARE_PROVIDER_SITE_OTHER): Payer: Medicare Other | Admitting: Cardiology

## 2014-02-19 ENCOUNTER — Encounter (INDEPENDENT_AMBULATORY_CARE_PROVIDER_SITE_OTHER): Payer: Self-pay

## 2014-02-19 VITALS — BP 146/84 | HR 71 | Ht 71.0 in | Wt 184.8 lb

## 2014-02-19 DIAGNOSIS — I4891 Unspecified atrial fibrillation: Secondary | ICD-10-CM | POA: Diagnosis not present

## 2014-02-19 NOTE — Patient Instructions (Signed)
Your physician recommends that you continue on your current medications as directed. Please refer to the Current Medication list given to you today.  Your physician wants you to follow-up in: 1 year with Dr. Hochrein. You will receive a reminder letter in the mail two months in advance. If you don't receive a letter, please call our office to schedule the follow-up appointment.  

## 2014-02-19 NOTE — Progress Notes (Signed)
HPI The patient has had followup since he had a firing of his defibrillator. He had a slightly abnormal exercise treadmill test and then was followed up with a Lexiscan Myoview 05/21/13  This demonstrated intermediate risk, large posterior lateral infarct, no ischemia, EF 26%.  Most recent echo demonstrated an EF of 60%.    Since he was last seen he has done well. He did gain weight when he stopped smoking cigarettes earlier this year. However, he's had no further firings of his defibrillator. He denies any chest pressure, neck or arm discomfort  He has had no shortness of breath, PND or orthopnea. He has had no palpitations, presyncope or syncope.  He has had no edema.   He is taking care of his wife and he has not been as active as I would like.     No Known Allergies  Current Outpatient Prescriptions  Medication Sig Dispense Refill  . ADVAIR DISKUS 250-50 MCG/DOSE AEPB INHALE 1 PUFF TWICE DAILY.  60 each  3  . allopurinol (ZYLOPRIM) 100 MG tablet TAKE 1 TABLET ONCE DAILY.  30 tablet  6  . aspirin 81 MG tablet Take 81 mg by mouth every morning.       . carvedilol (COREG) 3.125 MG tablet TAKE 1 TABLET TWICE DAILY WITH A MEAL.  60 tablet  6  . fenofibrate 160 MG tablet TAKE 1 TABLET DAILY.  30 tablet  6  . Inulin (FIBERCHOICE PO) Take 2 tablets by mouth daily.       Marland Kitchen lisinopril (PRINIVIL,ZESTRIL) 5 MG tablet TAKE 1 TABLET ONCE DAILY.  30 tablet  6  . Multiple Vitamin (MULTIVITAMIN) capsule Take 1 capsule by mouth daily.      . Multiple Vitamins-Minerals (PRESERVISION AREDS 2 PO) Take 2 Cans by mouth daily.      . simvastatin (ZOCOR) 40 MG tablet TAKE ONE TABLET AT BEDTIME.  90 tablet  2  . VITAMIN D, CHOLECALCIFEROL, PO Take 2 capsules by mouth daily.       No current facility-administered medications for this visit.    Past Medical History  Diagnosis Date  . Allergic rhinitis   . COPD (chronic obstructive pulmonary disease)   . Tobacco abuse   . Hypertension   . Atherosclerotic  heart disease   . Ischemic cardiomyopathy     EF improved to normal: Echo 8/14:  Mild LVH, focal basal hypertrophy, EF 60-65%, normal wall motion, mild BAE, PASP 36  . Syncope   . Presence of cardiac defibrillator   . Peripheral vascular disease   . Hypercholesterolemia   . Diverticulosis of colon   . History of colonic polyps   . Hemorrhoids   . Abnormal liver function tests   . DJD (degenerative joint disease)   . History of gout   . Back pain   . Compression fracture   . Osteoporosis   . Dermatitis   . T wave over sensing resulting in inappropriate shocks 08/14/2013    Past Surgical History  Procedure Laterality Date  . Abdominal aortic aneurysm repair  2002    by Dr. Kellie Simmering  . Cad stent  02/2002    Dr. Percival Spanish  . Aicd placed  03/2008    Dr. Caryl Comes  . Right corotid enderectomy  12/2010    Dr. Scot Dock  . Carotid endarterectomy  2010    ROS: Left foot pain.  Otherwise as stated in the HPI and negative for all other systems.  PHYSICAL EXAM BP 146/84  Pulse 71  Ht 5\' 11"  (1.803 m)  Wt 184 lb 12.8 oz (83.825 kg)  BMI 25.79 kg/m2 GENERAL:  Gaunt appearing, no distress HEENT: Pupil reactive, fundi not visualized, oral mucosa unremarkable NECK:  No jugular venous distention, waveform within normal limits, carotid upstroke brisk and symmetric, no bruits, no thyromegaly LUNGS:  Clear to auscultation bilaterally BACK:  No CVA tenderness CHEST:  ICD pocket intact HEART:  PMI not displaced or sustained,S1 and S2 within normal limits, no S3, no S4, no clicks, no rubs, no murmurs ABD:  Flat, positive bowel sounds normal in frequency in pitch, no bruits, no rebound, no guarding, no midline pulsatile mass, no hepatomegaly, no splenomegaly EXT:  2 plus pulses throughout, no edema, no cyanosis no clubbing  EKG:  Sinus rhythm, rate 71, RBBB, PVC, old inferior infarct.  No change from previous.  02/19/2014  ASSESSMENT AND PLAN  CARDIOMYOPATHY:  He seems to be euvolemic.  No change in  therapy is indicated.   No further imaging is indicated.   TOBACCO USE:  He is still not smoking.  CAROTID STENOSIS:  He has followed up with Dr. Scot Dock  HTN:   The blood pressure is only mildly elevated today.  He will continue with the current therapies.   CAD:   He has had no new symptoms.  We talked at length about exercising.  ICD:   He had followup as above and has had no more events. No further change in therapy is indicated.

## 2014-02-24 ENCOUNTER — Encounter: Payer: Self-pay | Admitting: Cardiology

## 2014-03-16 ENCOUNTER — Other Ambulatory Visit: Payer: Self-pay | Admitting: Pulmonary Disease

## 2014-04-17 ENCOUNTER — Other Ambulatory Visit: Payer: Self-pay | Admitting: Pulmonary Disease

## 2014-04-17 ENCOUNTER — Other Ambulatory Visit: Payer: Self-pay | Admitting: Cardiology

## 2014-05-20 ENCOUNTER — Encounter: Payer: Medicare Other | Admitting: *Deleted

## 2014-05-20 ENCOUNTER — Other Ambulatory Visit: Payer: Self-pay | Admitting: Pulmonary Disease

## 2014-05-20 MED ORDER — FLUTICASONE-SALMETEROL 250-50 MCG/DOSE IN AEPB: INHALATION_SPRAY | RESPIRATORY_TRACT | Status: DC

## 2014-05-24 ENCOUNTER — Other Ambulatory Visit: Payer: Self-pay | Admitting: Pulmonary Disease

## 2014-05-26 ENCOUNTER — Telehealth: Payer: Self-pay | Admitting: Pulmonary Disease

## 2014-05-26 ENCOUNTER — Encounter: Payer: Self-pay | Admitting: *Deleted

## 2014-05-26 MED ORDER — LISINOPRIL 5 MG PO TABS: ORAL_TABLET | ORAL | Status: DC

## 2014-05-26 NOTE — Telephone Encounter (Signed)
Per SN---  Ok to schedule an appt with SN for the pt.  Ok to send in refill of medication x 1 with no refills.  thanks

## 2014-05-26 NOTE — Telephone Encounter (Signed)
Pt aware RX called in. He is scheduled to see SN Monday 05/31/14 for appt.

## 2014-05-26 NOTE — Telephone Encounter (Signed)
Called spoke with pt. He reports he needs a refill on his lisinopril. We sent a denial stating "Reason for Refusal: Patient needs an appointment" Pt wants to scheduled an appt to see SN. Please advise thanks

## 2014-05-27 ENCOUNTER — Other Ambulatory Visit: Payer: Self-pay | Admitting: *Deleted

## 2014-05-27 ENCOUNTER — Encounter: Payer: Self-pay | Admitting: Family

## 2014-05-27 DIAGNOSIS — I6529 Occlusion and stenosis of unspecified carotid artery: Secondary | ICD-10-CM

## 2014-05-27 DIAGNOSIS — H35379 Puckering of macula, unspecified eye: Secondary | ICD-10-CM | POA: Diagnosis not present

## 2014-05-27 DIAGNOSIS — H353190 Nonexudative age-related macular degeneration, unspecified eye, stage unspecified: Secondary | ICD-10-CM | POA: Diagnosis not present

## 2014-05-27 DIAGNOSIS — H31099 Other chorioretinal scars, unspecified eye: Secondary | ICD-10-CM | POA: Diagnosis not present

## 2014-05-28 ENCOUNTER — Ambulatory Visit (HOSPITAL_COMMUNITY)
Admission: RE | Admit: 2014-05-28 | Discharge: 2014-05-28 | Disposition: A | Discharge status: Home / Self Care | Payer: Medicare Other | Source: Ambulatory Visit | Attending: Family | Admitting: Family

## 2014-05-28 ENCOUNTER — Ambulatory Visit (INDEPENDENT_AMBULATORY_CARE_PROVIDER_SITE_OTHER): Payer: Medicare Other | Admitting: Family

## 2014-05-28 ENCOUNTER — Encounter: Payer: Self-pay | Admitting: Family

## 2014-05-28 VITALS — BP 122/73 | HR 60 | Resp 16 | Ht 71.0 in | Wt 185.0 lb

## 2014-05-28 DIAGNOSIS — Z48812 Encounter for surgical aftercare following surgery on the circulatory system: Secondary | ICD-10-CM

## 2014-05-28 DIAGNOSIS — I6529 Occlusion and stenosis of unspecified carotid artery: Secondary | ICD-10-CM

## 2014-05-28 DIAGNOSIS — I2589 Other forms of chronic ischemic heart disease: Secondary | ICD-10-CM | POA: Diagnosis not present

## 2014-05-28 NOTE — Progress Notes (Signed)
Established Carotid Patient   History of Present Illness  [name] is a 80 y.o. male patient of Dr. Scot Dock followed for known carotid stenosis and history of right CEA in March of 2012.  Patient has Negative history of TIA or stroke symptom.  The patient denies amaurosis fugax or monocular blindness.  The patient  denies facial drooping.  Pt. denies hemiplegia.  The patient denies receptive or expressive aphasia.   He reports that he cannot remember things like he used to. He denies claudication symptoms in legs with walking, denies non healing wounds. He is caregiver for his wife.  Pt denies New Medical or Surgical History.  Pt Diabetic: No Pt smoker: former smoker, quit cigars in 2013, quit cigarettes several years prior to that  Pt meds include: Statin : Yes ASA: Yes Other anticoagulants/antiplatelets: no   Past Medical History  Diagnosis Date  . Allergic rhinitis   . COPD (chronic obstructive pulmonary disease)   . Tobacco abuse   . Hypertension   . Atherosclerotic heart disease   . Ischemic cardiomyopathy     EF improved to normal: Echo 8/14:  Mild LVH, focal basal hypertrophy, EF 60-65%, normal wall motion, mild BAE, PASP 36  . Syncope   . Presence of cardiac defibrillator   . Peripheral vascular disease   . Hypercholesterolemia   . Diverticulosis of colon   . History of colonic polyps   . Hemorrhoids   . Abnormal liver function tests   . History of gout   . Back pain   . Compression fracture   . Osteoporosis   . Dermatitis   . T wave over sensing resulting in inappropriate shocks 08/14/2013  . DJD (degenerative joint disease)     and Gout  . CAD (coronary artery disease)   . Myocardial infarction     Social History History  Substance Use Topics  . Smoking status: Former Smoker    Types: Cigars  . Smokeless tobacco: Never Used  . Alcohol Use: 1.8 oz/week    3 Glasses of wine per week    Family History Family History  Problem Relation Age of  Onset  . Hypertension Father   . Heart disease Father     Heart Disease before age 84  . Hypertension Mother   . Heart disease Mother     Heart Disease before age 56  . Cancer Mother     Surgical History Past Surgical History  Procedure Laterality Date  . Abdominal aortic aneurysm repair  2002    by Dr. Kellie Simmering  . Cad stent  02/2002    Dr. Percival Spanish  . Aicd placed  03/2008    Dr. Caryl Comes  . Right corotid enderectomy  12/2010    Dr. Scot Dock  . Carotid endarterectomy Right January 02, 2011  . Cardiac defibrillator placement  2010    No Known Allergies  Current Outpatient Prescriptions  Medication Sig Dispense Refill  . allopurinol (ZYLOPRIM) 100 MG tablet TAKE 1 TABLET ONCE DAILY.  30 tablet  1  . aspirin 81 MG tablet Take 81 mg by mouth every morning.       . carvedilol (COREG) 3.125 MG tablet TAKE 1 TABLET TWICE DAILY WITH A MEAL.  60 tablet  5  . fenofibrate 160 MG tablet TAKE 1 TABLET DAILY.  30 tablet  1  . Fluticasone-Salmeterol (ADVAIR DISKUS) 250-50 MCG/DOSE AEPB INHALE 1 PUFF TWICE DAILY.  60 each  3  . Inulin (FIBERCHOICE PO) Take 2 tablets by mouth  daily.       . lisinopril (PRINIVIL,ZESTRIL) 5 MG tablet TAKE 1 TABLET ONCE DAILY.  30 tablet  0  . Multiple Vitamin (MULTIVITAMIN) capsule Take 1 capsule by mouth daily.      . Multiple Vitamins-Minerals (PRESERVISION AREDS 2 PO) Take 2 Cans by mouth daily.      . simvastatin (ZOCOR) 40 MG tablet TAKE ONE TABLET AT BEDTIME.  90 tablet  1  . VITAMIN D, CHOLECALCIFEROL, PO Take 2 capsules by mouth daily.       No current facility-administered medications for this visit.    Review of Systems : See HPI for pertinent positives and negatives.  Physical Examination  Filed Vitals:   05/28/14 1532 05/28/14 1534  BP: 129/79 122/73  Pulse: 59 60  Resp:  16  Height:  5\' 11"  (1.803 m)  Weight:  185 lb (83.915 kg)  SpO2:  99%   Body mass index is 25.81 kg/(m^2).  General: WDWN male in NAD GAIT: normal Eyes:  PERRLA Pulmonary:  Non-labored, CTAB, Negative  Rales, Negative rhonchi, & Negative wheezing.  Cardiac: regular Rhythm ,  Negative detected murmur, pacemaker/defibrillator palpated subcutaneously left upper chest.  VASCULAR EXAM Carotid Bruits Right Left   Negative Negative     Radial pulses are 2+2 palpable and equal.                                                                                                                       Gastrointestinal: soft, nontender, BS WNL, no r/g,  negative masses.  Musculoskeletal: Negative muscle atrophy/wasting. M/S 5/5 throughout, Extremities without ischemic changes.  Skin: Seborrheic dermatitis on upper face.  Neurologic: A&O X 3; Appropriate Affect ; SENSATION ;normal;  Speech is normal CN 2-12 intact, Pain and light touch intact in extremities, Motor exam as listed above.   Non-Invasive Vascular Imaging CAROTID DUPLEX 05/28/2014   CEREBROVASCULAR DUPLEX EVALUATION    INDICATION: Carotid stenosis    PREVIOUS INTERVENTION(S): Right carotid endarterectomy 01/02/2011    DUPLEX EXAM:     RIGHT  LEFT  Peak Systolic Velocities (cm/s) End Diastolic Velocities (cm/s) Plaque LOCATION Peak Systolic Velocities (cm/s) End Diastolic Velocities (cm/s) Plaque  53 10 HT CCA PROXIMAL 80 12   55 13 HM CCA MID 60 11 HT  54 10 HM CCA DISTAL 55 11 HT  60 9 HM ECA 88 9 HT  77 24 HM ICA PROXIMAL 124 33 CP  110 31  ICA MID 92 26   89 32  ICA DISTAL 94 25     carotid endarterectomy ICA / CCA Ratio (PSV)   Antegrade Vertebral Flow Antegrade  673 Brachial Systolic Pressure (mmHg) 419  Biphasic Brachial Artery Waveforms Biphasic    Plaque Morphology:  HM = Homogeneous, HT = Heterogeneous, CP = Calcific Plaque, SP = Smooth Plaque, IP = Irregular Plaque     ADDITIONAL FINDINGS:     IMPRESSION: Right internal carotid artery is patent with history of carotid endarterectomy, mild to moderate hyperplasia  present from the proximal patch to the distal end  segment suggestive of stenosis of less than 40%. Left internal carotid artery stenosis present in the less than 40% range, however most likely underestimated due to acoustic shadow and dense calcific plaque present making Doppler interrogation difficult and limited.    Compared to the previous exam:  Unchanged since previous study on 02/11/2013.      Assessment: [name] is a 80 y.o. male who is s/p  right CEA in March of 2012.presents with asymptomatic minimal bilateral ICA stenosis. The  ICA stenosis is  Unchanged from previous exam.  Plan: Follow-up in 1year with Carotid Duplex scan.   I discussed in depth with the patient the nature of atherosclerosis, and emphasized the importance of maximal medical management including strict control of blood pressure, blood glucose, and lipid levels, obtaining regular exercise, and continued cessation of smoking.  The patient is aware that without maximal medical management the underlying atherosclerotic disease process will progress, limiting the benefit of any interventions. The patient was given information about stroke prevention and what symptoms should prompt the patient to seek immediate medical care. Thank you for allowing [place] to participate in this patient's care.  Clemon Chambers, RN, MSN, FNP-C [facility] Office: [phone]  Clinic Physician: Kellie Simmering on call  05/28/2014 3:48 PM

## 2014-05-28 NOTE — Patient Instructions (Signed)
Stroke Prevention Some medical conditions and behaviors are associated with an increased chance of having a stroke. You may prevent a stroke by making healthy choices and managing medical conditions. HOW CAN I REDUCE MY RISK OF HAVING A STROKE?   Stay physically active. Get at least 30 minutes of activity on most or all days.  Do not smoke. It may also be helpful to avoid exposure to secondhand smoke.  Limit alcohol use. Moderate alcohol use is considered to be:  No more than 2 drinks per day for men.  No more than 1 drink per day for nonpregnant women.  Eat healthy foods. This involves:  Eating 5 or more servings of fruits and vegetables a day.  Making dietary changes that address high blood pressure (hypertension), high cholesterol, diabetes, or obesity.  Manage your cholesterol levels.  Making food choices that are high in fiber and low in saturated fat, trans fat, and cholesterol may control cholesterol levels.  Take any prescribed medicines to control cholesterol as directed by your health care provider.  Manage your diabetes.  Controlling your carbohydrate and sugar intake is recommended to manage diabetes.  Take any prescribed medicines to control diabetes as directed by your health care provider.  Control your hypertension.  Making food choices that are low in salt (sodium), saturated fat, trans fat, and cholesterol is recommended to manage hypertension.  Take any prescribed medicines to control hypertension as directed by your health care provider.  Maintain a healthy weight.  Reducing calorie intake and making food choices that are low in sodium, saturated fat, trans fat, and cholesterol are recommended to manage weight.  Stop drug abuse.  Avoid taking birth control pills.  Talk to your health care provider about the risks of taking birth control pills if you are over 35 years old, smoke, get migraines, or have ever had a blood clot.  Get evaluated for sleep  disorders (sleep apnea).  Talk to your health care provider about getting a sleep evaluation if you snore a lot or have excessive sleepiness.  Take medicines only as directed by your health care provider.  For some people, aspirin or blood thinners (anticoagulants) are helpful in reducing the risk of forming abnormal blood clots that can lead to stroke. If you have the irregular heart rhythm of atrial fibrillation, you should be on a blood thinner unless there is a good reason you cannot take them.  Understand all your medicine instructions.  Make sure that other conditions (such as anemia or atherosclerosis) are addressed. SEEK IMMEDIATE MEDICAL CARE IF:   You have sudden weakness or numbness of the face, arm, or leg, especially on one side of the body.  Your face or eyelid droops to one side.  You have sudden confusion.  You have trouble speaking (aphasia) or understanding.  You have sudden trouble seeing in one or both eyes.  You have sudden trouble walking.  You have dizziness.  You have a loss of balance or coordination.  You have a sudden, severe headache with no known cause.  You have new chest pain or an irregular heartbeat. Any of these symptoms may represent a serious problem that is an emergency. Do not wait to see if the symptoms will go away. Get medical help at once. Call your local emergency services (911 in [place] Do not drive yourself to the hospital. Document Released: 11/15/2004 Document Revised: 02/22/2014 Document Reviewed: 04/10/2013 [employer] Patient Information 2015 [employer]. This information is not intended to replace advice given   to you by your health care provider. Make sure you discuss any questions you have with your health care provider.  

## 2014-05-31 ENCOUNTER — Encounter: Payer: Self-pay | Admitting: Pulmonary Disease

## 2014-05-31 ENCOUNTER — Ambulatory Visit (INDEPENDENT_AMBULATORY_CARE_PROVIDER_SITE_OTHER): Payer: Medicare Other | Admitting: Pulmonary Disease

## 2014-05-31 VITALS — BP 128/64 | HR 61 | Temp 97.8°F | Ht 71.0 in | Wt 188.4 lb

## 2014-05-31 DIAGNOSIS — K5730 Diverticulosis of large intestine without perforation or abscess without bleeding: Secondary | ICD-10-CM

## 2014-05-31 DIAGNOSIS — E559 Vitamin D deficiency, unspecified: Secondary | ICD-10-CM

## 2014-05-31 DIAGNOSIS — M549 Dorsalgia, unspecified: Secondary | ICD-10-CM

## 2014-05-31 DIAGNOSIS — D126 Benign neoplasm of colon, unspecified: Secondary | ICD-10-CM

## 2014-05-31 DIAGNOSIS — F419 Anxiety disorder, unspecified: Secondary | ICD-10-CM

## 2014-05-31 DIAGNOSIS — I6529 Occlusion and stenosis of unspecified carotid artery: Secondary | ICD-10-CM

## 2014-05-31 DIAGNOSIS — I10 Essential (primary) hypertension: Secondary | ICD-10-CM | POA: Diagnosis not present

## 2014-05-31 DIAGNOSIS — T148XXA Other injury of unspecified body region, initial encounter: Secondary | ICD-10-CM

## 2014-05-31 DIAGNOSIS — I2589 Other forms of chronic ischemic heart disease: Secondary | ICD-10-CM | POA: Diagnosis not present

## 2014-05-31 DIAGNOSIS — I2510 Atherosclerotic heart disease of native coronary artery without angina pectoris: Secondary | ICD-10-CM

## 2014-05-31 DIAGNOSIS — J449 Chronic obstructive pulmonary disease, unspecified: Secondary | ICD-10-CM

## 2014-05-31 DIAGNOSIS — M1990 Unspecified osteoarthritis, unspecified site: Secondary | ICD-10-CM

## 2014-05-31 NOTE — Progress Notes (Addendum)
Subjective:    Patient ID: [name], male    DOB: 11/05/1930, 80 y.o.   MRN: [id number]  HPI 80 y/o WM here for a follow up visit... he has been followed closely by DrHochrein & DrKlein during the past years due to his cardiomyopathy & AICD...  ~  SEE PREV EPIC NOTES FOR THE EARLIER DATA >>  ~  October 30, 2012:  6mo ROV & Saturnino presents w/ a 2wk hx URI starting w/ low grade temp, chills, & feeling cold/ feeling bad; then developed cough w/ thick yellow phlegm and chest congestion- no hemoptysis, no CP x sore from coughing; he tried OTC meds w/o benefit & progressed w/ incr SOB prompting him to call EMS & go to the ER 1/7;  ER eval reviewed: congested w/ rhonchi on exam; T99.2 RR=21 O2sat=93%; CXR was clear & CBC- ok w/ WBC=10.4; treated w/ O2, NEB, IV fluid and improved...     Today his exam shows mod cough, exp wheezing & rhonchi, no consolidation, thick yellow sput, etc; We discussed Acute on Chronic COPD and treatment w/ Levaquin, Depo80, Prednisone taper, Advair250, Mucinex w/ Fluids, Hycodan as needed etc... We will plan ROV recheck in 1 mo.    We reviewed prob list, meds, xrays and labs> see below for updates >>   ~  December 09, 2012:  6wk ROV & recheck> [name] responded nicely to our Rx w/ Pred, Levaquin, Advair250, Mucinex, etc;  He has since stopped all the meds including the Advair- we discussed this & the reason for the long term use of the Advair250- one inhalation Bid & he agrees to continue; plus the Mucinex prn for thick phlegm & Proair as needed...    We reviewed his other medical issues today- BP controlled on meds; Cardiac stable & he gets pacer checks w/ DrKlein; Cerebrovasc f/u DrDickson yearly for Dopplers due this spring; needs to ret fasting for labs...  ~  June 08, 2013:  6mo ROV & [name] recently ret from a trip to [place] & had several inappropriate shocks from his AICD- checked by Cards (followed by Hochrein & Klein) 7/14=> Twave oversensing & devise adjusted;  He had  Treadmill test 7/14- no ischemic changes & no T wave oversensing but BP dropped therefore he had Myoview as well 7/14 intermed risk w/ large posterolat infarct, no ischemia, EF=26% (worse) w/ diffuse HK of posterolat wall;  He's had some weight gain he attrib to smoking cessation, BNP=156;  He had f/u 2DEcho & curiously this revealed mild LVH, focal basal hypertrophy, norm LVF w/ EF=60-65% & no regional wall motion abn, mild LA&RA dil => he has f/u DrHochrein soon... We reviewed the following medical problems during today's office visit >>     COPD> on Advair250Bid & Proair for prn use; doing better w/ smoking cessation...    HBP> on Coreg3.125Bid & Lisinopril5; BP= 102/60 & denies CP, palpit, dizzy/syncope, ch in SOB/DOE, edema...    ASHD/ Cardiomyopathy> on ASA 81mg/d; improved w/ BBlocker/ ACE rx, not requiring diuretic and denies CP/ angina, etc; followed by DrHochrein; Treadmill, Myoview, 2DEcho 7-8/14 reviewed & ?EF via Echo?    AICD w/ hx Twave oversensing 7/14 requiring AICD device adjustment by DrKlein...    Periph Vasc Dis> AAA repair 2000 by DrLawson, still too sedentary & he declined cardiac rehab; s/p R CAE w/ DPA 3/12 by DrCDickson; f/u CDoppler by [facility] 4/14 showed patent rightCAE site & <40% left ICA stenosis w/ calcif plaque; they plan regular follow up...      CHOL> on Simva40 + Fenofib160; FLP 8/14 shows TChol 122, TG 88, HDL 40, LDL 65; continue same...    GI> GERD, Divertics, Polyps,etc>  GI reported stable on incr Fiber intake; his CC= gas pains & we rec Mylicon, Phazyme etc...    DJD, osteoporosis, compression fx>  Vit D level is low at 19 & rec to take Calcium, Men's formula MVI, & he never tool the 50K Rx; Rec- OTC 2000u daily...    DERM> he saw Derm re: his rash (?seb dermatitis) and itching; labs show 22% eos & rec to take Antihist eg Benedryl, Allegra, Zyrkek daily... We reviewed prob list, meds, xrays and labs> see below for updates >>   LABS 8/14:  FLP- at goals on  Simva40+Feno160;  Chems- wnl;  CBC- wnl x Plat=112;  TSH=2.42;  VitD=19;  PSA=1.66...  ~  May 31, 2014:  1yr ROV & [name] reports doing satis, he missed 6mo ROV due to the weather in Feb & was doing well so he put it off til now;  No new complaints or concerns- he notes that his breathing is good, denies any signif cough, sput, SOB, etc; he's been active doing all the work around the house esp since wife is very limited in activity due to her COPD; he further denies CP, palpit, edema; his only complaint on ROS is some back pain after strenuous exercise & it resolves w/ a little rest... We reviewed the following medical problems during today's office visit >>     COPD> on Advair250Bid & Proair for prn use; doing better w/ complete smoking cessation; PFTs show GOLD Stage3 COPD w/ FEV1=1.56 (47%); he is relatively asymptomatic & doesn't want to add additional meds.     HBP> on Coreg3.125Bid & Lisinopril5; BP= 128/64 & denies CP, palpit, dizzy/syncope, ch in SOB/DOE, edema...    ASHD/ Cardiomyopathy> on ASA 81mg/d; improved w/ BBlocker/ ACE rx, not requiring diuretic and denies CP/ angina, etc; followed by DrHochrein- Treadmill, Myoview, 2DEcho 7-8/14 reviewed & EF=26% by Myoview & 60% by 2DEcho...     AICD w/ hx Twave oversensing 7/14 requiring AICD device adjustment by DrKlein...    Periph Vasc Dis> AAA repair 2000 by DrLawson, still too sedentary & he declined cardiac rehab; s/p R CAE w/ DPA 3/12 by DrCDickson; f/u CDoppler by [facility] 8/15 showed patent rightCAE site & <40% left ICA stenosis w/ calcif plaque; they plan yearly follow up...    CHOL> on Simva40 + Fenofib160; FLP 8/15 shows TChol 114, TG 139, HDL 33, LDL 53; continue same & incr exercise...    GI> GERD, Divertics, Polyps,etc>  GI reported stable on incr Fiber intake; prev gas pains improved w/ Mylicon, Phazyme etc...    DJD, osteoporosis, compression fx> on Calcium, Men's formula MVI & OTC VitD 2000u daily; Labs 8/15 showed Vit D level = 59; CXR  w/ new part T10 compression=> needs f/u BMD & consider med rx...    DERM> he saw Derm re: his ?seb dermatitis and itching- improved on Rx; labs show 20% eos & rec to take Antihist eg Benedryl, Allegra, Zyrkek daily... We reviewed prob list, meds, xrays and labs> see below for updates >>   CXR 8/15 showed Cardiomeg & AICD w/o change, clear lungs/ NAD, old right rib fxs & new T10 compression, osteopenia => needs repeat BMD & consideration of meds.  PFT 8/15 showed FVC=2.72 (61%), FEV1=1.56 (47%), %1sec=57, mid-flows=31% predicted; c/w GOLD Stage3 COPD...  LABS 8/15:  FLP- at goals on Simva40+Feno160 x HDL=33;    Chems- ok w/ Cr=1.3;  CBC- ok w/ Hg=13.9 but MCV=105, Plat=96K & Eos=20%;  TSH=2.26;  Uric=3.9 on Allopurinol;  VitD=59 on OTC supplement...  ADDENDUM>> DrHochrein reviewed his 80 2DEcho & Myoview>> he feels that EF is ~45%... ADDENDUM>> BMD 06/15/14 showed lowest Tscore -3.2 in right [facility]; discussed w/ pt need for bone building Rx- start ALENDRONATE 36m/wk... Called to [facility]         Problem List:  COPD ([id number] - despite all efforts JKnoxcontinues to smoke 3-4 cigars per day... he has min cough, some phlegm, but denies CP, SOB, wheezing, etc...  He doesn't want Chantix or help w/ smoking cessation;  He has a PROAIR inhaler for Prn use but he seldom uses it, & he knows to use the OTC MUCINEX 1-2 Bid w/ fluids for congestion. ~  CXR 3/12 in hosp for CAE showed COPD/E, biapical pleuroparenchymal scarring, NAD, AICD on left, old left rib fx, old T12 vertebroplasty... ~  Fall at home 4/12 w/ signif trauma & prob right rib fxs (CXR in ER showed some atelec but NAD & no rib films done). ~  1/14: presents w/ URI & acute on chr COPD exac; CXR in ER showed norm heart size, clear lungs, AICD in place, compression T12 w/ augmentation. ~  2/14: he responded nicely to Levaquin, Pred, Advair250, Mucinex, etc; asked to STAY on the [id number] Bid; he reports quitting the cigars! ~  8/14: on  Advair250Bid & Proair for prn use; doing better w/ smoking cessation... ~  8/15:   on Advair250Bid & Proair for prn use; doing better w/ complete smoking cessation; PFTs show GOLD Stage3 COPD w/ FEV1=1.56 (47%); he is relatively asymptomatic & doesn't want to add additional meds. ~  CXR 8/15 showed Cardiomeg & AICD w/o change, clear lungs/ NAD, old right rib fxs & new T10 compression, osteopenia => needs repeat BMD & consideration of meds. ~  PFT 8/15 showed FVC=2.72 (61%), FEV1=1.56 (47%), %1sec=57, mid-flows=31% predicted; c/w GOLD Stage3 COPD...   HYPERTENSION (ICD-401.9) - back on COREG 3.125Bid & LISINOPRIL 519mhs... Prev had to wean off these due to dizziness & post hypotension (resolved off Etoh). ~  12/11:  BP= 126/70 and doing well> denies HA, fatigue, visual changes, CP, palipit, dizziness, dyspnea, edema, etc; he has had postural hypotension & several syncopal episodes related to this- now improved w/ the final adjustment in his meds. ~  4/12:  BP= 90/58 ==>100/60 recheck & he is weak; asked to monitor BP at home & may need to decr meds. ~  5/12:  BP= 130/70 supine & 100/60 sitting & standing (no symptoms at present)> he has been off the Lisinopril & Coreg for 1wk now. ~  7/12:  BP= 120/72 & 140/70 w/o postural changes (improved off etoh); he is back on Lisinopril 18m18mhs per Cards. ~  12/12:  BP= 128/74 on Coreg3.125Bid & Lisinopril5; denies CP, palpit, dizzy/syncope, ch in SOB/DOE, edema... ~  6/13:  BP= 122/68 & he remains largely asymptomatic... ~  1/14:  BP= 120/58 & he is here w/ acute on chr COPD exac; denies CP, palpit, etc... ~  8/14:  on Coreg3.125Bid & Lisinopril5; BP= 102/60 & denies CP, palpit, dizzy/syncope, ch in SOB/DOE, edema... ~  8/15:  on Coreg3.125Bid & Lisinopril5; BP= 128/64 & denies CP, palpit, dizzy/syncope, ch in SOB/DOE, edema...  ATHEROSCLEROTIC HEART DISEASE (ICD-414.00) - on ASA 63m71m+ above meds... ISCHEMIC CARDIOMYOPATHY (ICD-414.8) Hx of SYNCOPE  (ICD-780.2) & IMPLANTABLE DEFIBRILLATOR, DDD MDT (ICD-V45.02) ~  Cath  in 2003 w/ 2 vessel CAD and stent placed in LAD...  ~  Cardiolite 1/08 w/ large inferolat infarct, no ischemia, EF=36%...  ~  2DEcho 2/08 w/ infer & post HK, EF=40%...  ~  recath 6/09 w/ heavily calcif vessels & 40% EF- DrHochrein has been following carefully and adjusting meds. ~  AICD placed 2009 by DrKlein for hx syncope & ischemic cardiomyopathy- followed by DrKlein yearly & doing satis. ~  2DEcho 4/10 showed mild LVH, mod reduced LVF w/ EF=40-45% w/ inferobasal & post HK, mild MR, paradoxical septal motion. ~  9/10: Cards tried to incr the Coreg to 9.375mgBid but pt intol & went back to 6.25Bid. ~  8/11:  f/u by DrHochrein w/ recent syncopal episode & meds adjusted Coreg 3.125Bid & Lisinopril 5Bid. ~  Serial XRays have all revealed signif atherosclerotic changes diffusely (eg- lumbar films 4/12, CT neck 4/12 as well). ~  4/12> ER visit for fall at home w/ signif trauma> ?post BP related, ?med related, ?other etiology > Cards eval & weaned off Coreg/ Lisinopril w/ resolution of postural hypotension. ~  7/12:  DrKlein restarted Lisin 5mgQhs, BP improved, no further postural changes, f/u 2DEcho w/ EF=35-40% & Gr1DD... ~  12/12:  DrHochrein has him back on Coreg3.125Bid & Lisinopril5/d & stable overall... ~  7/13: he saw DrKlein w/ interrogation of his AICD showing some AFib episodes; he is considering anticoagulation but held off after discussion w/ DrHochrein... ~  7-8/14: on ASA, Coreg, Lisin; improved w/ BBlocker/ ACE rx, not requiring diuretic and denies CP/ angina, etc; followed by DrHochrein- Treadmill, Myoview, 2DEcho 7-8/14 reviewed & ?EF via Echo?  AICD w/ hx Twave oversensing 7/14 requiring AICD device adjustment by DrKlein...  HE CONTINUES TO f/u w/ DrKlein & DrHochrein YEARLY... ~  MYOVIEW 7/14 showed large posterolat wall infarct w/o ischemia, EF=26%, diffuse HK in posterolat wall... ~  2DEcho 8/14 showed mild LVH,  focal basal hypertrophy, norm LVF w/ EF=60-65%, norm wall motion, mild LA&RA dil, PAsys=36...  ~  ADDENDUM>> DrHochrein reviewed his 2014 2DEcho & Myoview>> he feels that EF is ~45%...  CEREBROVASCULAR DISEASE PERIPHERAL VASCULAR DISEASE (ICD-443.9) - on ASA 81mg/d...  ~  He is s/p AAA repair 2000 by DrLawson... he is sedentary and hasn't had ABI's checked... I will leave this to DrHochrein... ~  CDopplers 7/10, 1/1,1 & 8/11 showed stable mod carotid dis bilat w/ heavy calcif plaque & 60-79% bilat ICA stenoses... ~  CDopplers 2/12 w/ worsening RICA velocities c/w 80-99% stenosis (stable 60-79% LICA stenosis);  He was evaluated by [facility] DrDickson & underwent a right CAE w/ DPA 3/12 without complications;  He has been doing satis post-op & they plan f/u CDoppler in 6mo intervals going forward... ~  NOTE:  CT Brain 4/12 in ER showed mild to mod cortical vol loss & cbll atrophy, sm vessel dis, no acute changes... Prom vasc calcif seen on neck films & in abd... ~  CDopplers 4/13 by DrDickson showed patent right CAE site w/ mild plaque; 40-59% left ICA stenosis felt to be stable... ~  CDopplers 4/14 showed patent right CAE site w/ smooth plaque, and <40% left ICA stenosis but velocities are underest due to calcif plaque; felt to be stable... ~  8/15:  f/u CDoppler by [facility] 8/15 showed patent rightCAE site & <40% left ICA stenosis w/ calcif plaque; they plan yearly follow up.  HYPERCHOLESTEROLEMIA (ICD-272.0) - on SIMVASTATIN 40mg/d & FENOFIBRATE 160/d. ~  FLP 4/08 showed TChol 131, TG 100, HDL 41, LDL 70 ~    FLP 2/09 showed TChol 124, TG 132, HDL 37, LDL 61 ~  FLP 12/10 > pt never ret for FLP & insurance changed Vytorin to SIMVA40. ~  FLP 12/11 showed TChol 112, TG 51, HDL 43, LDL 59 ~  4/12:  They report some difficult swallowing the Tricor capsule==> referred to GI for swallowing eval & switched to FENOFIBRATE 160mg/d. ~  FLP 12/12 on Simva40+Feno160 showed TChol 124, TG 78, HDL 37, LDL 72 ~  FLP 8/14 on  Simva40+Feno160 showed TChol 122, TG 88, HDL 40, LDL 65; continue same...  ~  FLP 8/15 on Simva40+Feno160 showed   GERD/ DYSPHAGIA> ~  4/12: pt noted some reflux symptoms & mild dysphagia for large capsule; Protonix 40mg/d started & refer to GI for eval; they also note weak voice & may need ENT eval if not resolved in follow up... ~  5/12:  Pt states all symptoms resolved on their own, didn't take PPI, didn't see GI, denies swallowing or voice issues now.  DIVERTICULOSIS OF COLON (ICD-562.10) > he takes fiber supplement daily. COLONIC POLYPS (ICD-211.3) HEMORRHOIDS (ICD-455.6) - last colonoscopy was 11/06 by DrPatterson showing divertics, several 1-3mm polyps (hyperplastic), and hems...  Hx of LIVER FUNCTION TESTS, ABNORMAL (ICD-794.8) - improved off etoh... ~  labs 2/09 showed SGOT= 30, SGPT= 17 ~  labs 12/11 showed SGOT= 38, SGPT= 19 ~  Labs 12/12 off all etoh & LFTs all wnl... ~  LFTs have remained wnl...  DEGENERATIVE JOINT DISEASE (ICD-715.90) Hx of GOUT (ICD-274.9) - on ALLOPURINOL 100mg/d... ~  Labs 2/09 showed Uric= 3.3 ~  Labs 8/15 on allopurinol 100mg/d showed Uric = 3.9  OSTEOPOROSIS (ICD-733.00) - supposed to be on Caltrate, MVI, Vit D...  ~  he had T12 compression after syncopal spell 2009 w/ vertebroplasty by DrDeveshwar...   ~  BMD here 10/09 showed TScores +0.3 in Spine, & -2.7 in right FemNeck (Ortho eval by DrKendall)... ~  labs 12/11 showed Vit D level = 22... rec to take Men's MVI + Vit D 2000 u daily. ~  Labs 12/12 showed Vit D level = 17; REC> start regular dosing of Calcium, Men's MVI, Vit D 5000u daily... ~  6/13: he never got the OTC Vit D so we will Rx w/ VitD 50K weekly Rx now... ~  8/15: on calcium, MVI, VitD 2000u daily w/ labs showing VitD level = 59 ~  CXR 8/15 showed new part compression T10=> will sched f/u BMD ~  BMD 06/15/14 showed lowest Tscore -3.2 in right FemNeck (spine was +0.1); discussed w/ pt need for bone building Rx- start ALENDRONATE  70mg/wk... Called to [facility].  DERMATITIS (ICD-692.9) - he has eosinophilia & saw Derm w/ rx for topical cream;  rec to take antihist as well...  Health Maintenance -  ~  GI: colonoscopy 11/06 w/ several hyperplastic polyps removed... ~  GU: PSA 12/12 = 1.50 ~  Immunizations:  he tells me that he had PNEUMOVAX & 2010 Flu shot 10/10... received TETANUS shot here 2003...   Past Surgical History  Procedure Laterality Date  . Abdominal aortic aneurysm repair  2002    by Dr. Lawson  . Cad stent  02/2002    Dr. Hochrein  . Aicd placed  03/2008    Dr. Klein  . Right corotid enderectomy  12/2010    Dr. Dickson  . Carotid endarterectomy Right January 02, 2011  . Cardiac defibrillator placement  2010    Outpatient Encounter Prescriptions as of 05/31/2014  Medication Sig  .   allopurinol (ZYLOPRIM) 100 MG tablet TAKE 1 TABLET ONCE DAILY.  . aspirin 81 MG tablet Take 81 mg by mouth every morning.   . carvedilol (COREG) 3.125 MG tablet TAKE 1 TABLET TWICE DAILY WITH A MEAL.  . fenofibrate 160 MG tablet TAKE 1 TABLET DAILY.  . Fluticasone-Salmeterol (ADVAIR DISKUS) 250-50 MCG/DOSE AEPB INHALE 1 PUFF TWICE DAILY.  . Inulin (FIBERCHOICE PO) Take 2 tablets by mouth daily.   . lisinopril (PRINIVIL,ZESTRIL) 5 MG tablet TAKE 1 TABLET ONCE DAILY.  . Multiple Vitamin (MULTIVITAMIN) capsule Take 1 capsule by mouth daily.  . Multiple Vitamins-Minerals (PRESERVISION AREDS 2 PO) Take 2 Cans by mouth daily.  . simvastatin (ZOCOR) 40 MG tablet TAKE ONE TABLET AT BEDTIME.  . VITAMIN D, CHOLECALCIFEROL, PO Take 2 capsules by mouth daily.    No Known Allergies   Current Medications, Allergies, Past Medical History, Past Surgical History, Family History, and Social History were reviewed in [facility] electronic medical record.    Review of Systems         See HPI - all other systems neg except as noted... The patient complains of dyspnea on exertion, prolonged cough, and difficulty walking.  The  patient denies anorexia, fever, weight loss, weight gain, vision loss, decreased hearing, hoarseness, chest pain, syncope, peripheral edema, headaches, hemoptysis, abdominal pain, melena, hematochezia, severe indigestion/heartburn, hematuria, incontinence, muscle weakness, suspicious skin lesions, transient blindness, depression, unusual weight change, abnormal bleeding, enlarged lymph nodes, and angioedema.     Objective:   Physical Exam      WD, WN, 80 y/o WM > chr ill appearing but NAD... GENERAL:  Alert & oriented; pleasant & cooperative... HEENT:  [place]/AT, EOM-full, PERRLA, EACs-clear  NOSE-clear, THROAT-clear & wnl, Voice sounds back to norm NECK:  Supple w/ fairROM; no JVD; prominent carotid impulses, scar on right, + bruits; no thyromegaly or nodules palpated; no lymphadenopathy... CHEST:  bilat rhonchi at bases & end-exp wheezing,  no rales, no signs of consolidation... HEART:  Regular Rhythm; gr 1/6 SEM, S4, no rubs... ABDOMEN:  Min Bruise in right flank, mild tender lower ribs; normal bowel sounds; no organomegaly or masses detected. EXT: without deformities, mild arthritic changes; no varicose veins/ +venous insuffic/ no edema. NEURO: no focal neuro deficits... DERM:  dry skin dermatitis, seborrhea, rosacea...  RADIOLOGY DATA:  Reviewed in the EPIC EMR & discussed w/ the patient...  LABORATORY DATA:  Reviewed in the EPIC EMR & discussed w/ the patient...   Assessment & Plan:    GOLD stage3 COPD>  he is congratulated on quitting smoking completely; Advised to stay on the Advair Bid regularly, he does not want additional meds...    Hx HBP & Hx Postural Hypotension>  BP improved & tolerating Coreg 3.125Bid + Lisinopril 5mg Qhs; no postural BP changes noted today & his dizziness has resolved; he notes all symptoms improved off etoh...  ASHD/ Cardiomyop/ AICD>  Followed by DHochrein & Klein yearly;  Continue current meds & follow up...  Periph Vasc Dis>  Followed by [facility],  DrDickson & CDoppler 8/15 showed patent right CAE site w/ <40% left ICA stenosis & they are following...  CHOL>  FLP looks good on Simva40 & Feno160, continue same...  GI/ Dysphagia>  He notes symptoms resolved spont & he does not believe that he has a problem in this area, declines PPI Rx or GI eval...  LBP w/ compression fx T12 in 2009 w/ vertebroplasty>>  FALL 4/12 w/ signif trauma & right rib fxs>   OSTEOPOROSIS>    On calcium, MVI, VitD 2000u/d;  Needs f/u BMD & consideration of med rx=> BMD -3.2 in R FemNeck, start Alendronate70/wk... New part compression T10 found 8/15 on routine CXR>   Other medical issues as noted...   Patient's Medications  New Prescriptions   No medications on file  Previous Medications   ALLOPURINOL (ZYLOPRIM) 100 MG TABLET    TAKE 1 TABLET ONCE DAILY.   ASPIRIN 81 MG TABLET    Take 81 mg by mouth every morning.    CARVEDILOL (COREG) 3.125 MG TABLET    TAKE 1 TABLET TWICE DAILY WITH A MEAL.   FENOFIBRATE 160 MG TABLET    TAKE 1 TABLET DAILY.   FLUTICASONE-SALMETEROL (ADVAIR DISKUS) 250-50 MCG/DOSE AEPB    INHALE 1 PUFF TWICE DAILY.   INULIN (FIBERCHOICE PO)    Take 2 tablets by mouth daily.    LISINOPRIL (PRINIVIL,ZESTRIL) 5 MG TABLET    TAKE 1 TABLET ONCE DAILY.   MULTIPLE VITAMIN (MULTIVITAMIN) CAPSULE    Take 1 capsule by mouth daily.   MULTIPLE VITAMINS-MINERALS (PRESERVISION AREDS 2 PO)    Take 2 Cans by mouth daily.   SIMVASTATIN (ZOCOR) 40 MG TABLET    TAKE ONE TABLET AT BEDTIME.   VITAMIN D, CHOLECALCIFEROL, PO    Take 2 capsules by mouth daily.  Modified Medications   No medications on file  Discontinued Medications   No medications on file   

## 2014-05-31 NOTE — Patient Instructions (Signed)
Today we updated your med list in our EPIC system...    Continue your current medications the same...  Today we did your baseline Pulmonary function test... Please return to our lab in the AM for your follow up CXR & FASTING blood work...    We will contact you w/ the results when available...   Keep up the good work w/ diet, exercise, and not smoking!!!  Call for any questions...  Continue your regular follow up visits w/ DrKlein, DrHochrein, & the vasc surg team...  Let's plan a follow up visit in 56yr, sooner if needed for problems.Marland KitchenMarland Kitchen

## 2014-06-01 ENCOUNTER — Other Ambulatory Visit (INDEPENDENT_AMBULATORY_CARE_PROVIDER_SITE_OTHER): Payer: Medicare Other

## 2014-06-01 ENCOUNTER — Ambulatory Visit (INDEPENDENT_AMBULATORY_CARE_PROVIDER_SITE_OTHER): Payer: Medicare Other | Admitting: *Deleted

## 2014-06-01 ENCOUNTER — Ambulatory Visit (INDEPENDENT_AMBULATORY_CARE_PROVIDER_SITE_OTHER)
Admission: RE | Admit: 2014-06-01 | Discharge: 2014-06-01 | Disposition: A | Discharge status: Home / Self Care | Payer: Medicare Other | Source: Ambulatory Visit | Attending: Pulmonary Disease | Admitting: Pulmonary Disease

## 2014-06-01 DIAGNOSIS — I2510 Atherosclerotic heart disease of native coronary artery without angina pectoris: Secondary | ICD-10-CM | POA: Diagnosis not present

## 2014-06-01 DIAGNOSIS — F17200 Nicotine dependence, unspecified, uncomplicated: Secondary | ICD-10-CM | POA: Diagnosis not present

## 2014-06-01 DIAGNOSIS — M549 Dorsalgia, unspecified: Secondary | ICD-10-CM | POA: Diagnosis not present

## 2014-06-01 DIAGNOSIS — I428 Other cardiomyopathies: Secondary | ICD-10-CM

## 2014-06-01 DIAGNOSIS — I6529 Occlusion and stenosis of unspecified carotid artery: Secondary | ICD-10-CM | POA: Diagnosis not present

## 2014-06-01 DIAGNOSIS — F411 Generalized anxiety disorder: Secondary | ICD-10-CM

## 2014-06-01 DIAGNOSIS — J449 Chronic obstructive pulmonary disease, unspecified: Secondary | ICD-10-CM

## 2014-06-01 DIAGNOSIS — E559 Vitamin D deficiency, unspecified: Secondary | ICD-10-CM

## 2014-06-01 DIAGNOSIS — I2589 Other forms of chronic ischemic heart disease: Secondary | ICD-10-CM | POA: Diagnosis not present

## 2014-06-01 DIAGNOSIS — R55 Syncope and collapse: Secondary | ICD-10-CM | POA: Diagnosis not present

## 2014-06-01 DIAGNOSIS — F419 Anxiety disorder, unspecified: Secondary | ICD-10-CM

## 2014-06-01 DIAGNOSIS — I10 Essential (primary) hypertension: Secondary | ICD-10-CM | POA: Diagnosis not present

## 2014-06-01 LAB — TSH: TSH: 2.26 u[IU]/mL (ref 0.35–4.50)

## 2014-06-01 LAB — MDC_IDC_ENUM_SESS_TYPE_REMOTE
Battery Voltage: 2.74 V
Brady Statistic AP VP Percent: 0 %
Brady Statistic AS VP Percent: 0 %
Brady Statistic RA Percent Paced: 16.06 %
HIGH POWER IMPEDANCE MEASURED VALUE: 65 Ohm
HighPow Impedance: 47 Ohm
Lead Channel Impedance Value: 494 Ohm
Lead Channel Impedance Value: 494 Ohm
Lead Channel Sensing Intrinsic Amplitude: 2.5 mV
Lead Channel Sensing Intrinsic Amplitude: 2.5 mV
Lead Channel Sensing Intrinsic Amplitude: 5.25 mV
Lead Channel Setting Pacing Amplitude: 2 V
Lead Channel Setting Pacing Amplitude: 2.5 V
Lead Channel Setting Pacing Pulse Width: 0.4 ms
MDC IDC MSMT LEADCHNL RV SENSING INTR AMPL: 5.25 mV
MDC IDC SESS DTM: 20150811161505
MDC IDC SET LEADCHNL RV SENSING SENSITIVITY: 0.6 mV
MDC IDC SET ZONE DETECTION INTERVAL: 400 ms
MDC IDC STAT BRADY AP VS PERCENT: 16.06 %
MDC IDC STAT BRADY AS VS PERCENT: 83.94 %
MDC IDC STAT BRADY RV PERCENT PACED: 0 %
Zone Setting Detection Interval: 260 ms
Zone Setting Detection Interval: 300 ms
Zone Setting Detection Interval: 350 ms
Zone Setting Detection Interval: 360 ms

## 2014-06-01 LAB — LIPID PANEL
CHOL/HDL RATIO: 3
CHOLESTEROL: 114 mg/dL (ref 0–200)
HDL: 33.1 mg/dL — ABNORMAL LOW (ref >39.00)
LDL CALC: 53 mg/dL (ref 0–99)
NonHDL: 80.9
Triglycerides: 139 mg/dL (ref 0.0–149.0)
VLDL: 27.8 mg/dL (ref 0.0–40.0)

## 2014-06-01 LAB — BASIC METABOLIC PANEL
BUN: 18 mg/dL (ref 6–23)
CALCIUM: 9.5 mg/dL (ref 8.4–10.5)
CO2: 26 mEq/L (ref 19–32)
Chloride: 102 mEq/L (ref 96–112)
Creatinine, Ser: 1.3 mg/dL (ref 0.4–1.5)
GFR: 54.34 mL/min — AB (ref >60.00)
GLUCOSE: 76 mg/dL (ref 70–99)
Potassium: 5.2 mEq/L — ABNORMAL HIGH (ref 3.5–5.1)
SODIUM: 137 meq/L (ref 135–145)

## 2014-06-01 LAB — CBC WITH DIFFERENTIAL/PLATELET
Basophils Absolute: 0 10*3/uL (ref 0.0–0.1)
Basophils Relative: 0.4 % (ref 0.0–3.0)
Eosinophils Absolute: 1.3 10*3/uL — ABNORMAL HIGH (ref 0.0–0.7)
HEMATOCRIT: 40.8 % (ref 39.0–52.0)
Hemoglobin: 13.9 g/dL (ref 13.0–17.0)
LYMPHS ABS: 1.6 10*3/uL (ref 0.7–4.0)
Lymphocytes Relative: 23.2 % (ref 12.0–46.0)
MCHC: 34.2 g/dL (ref 30.0–36.0)
MCV: 105.4 fl — AB (ref 78.0–100.0)
Monocytes Absolute: 0.5 10*3/uL (ref 0.1–1.0)
Monocytes Relative: 7.5 % (ref 3.0–12.0)
Neutro Abs: 3.3 10*3/uL (ref 1.4–7.7)
Neutrophils Relative %: 49.3 % (ref 43.0–77.0)
Platelets: 96 10*3/uL — ABNORMAL LOW (ref 150.0–400.0)
RBC: 3.87 Mil/uL — ABNORMAL LOW (ref 4.22–5.81)
RDW: 14.1 % (ref 11.5–15.5)
WBC: 6.7 10*3/uL (ref 4.0–10.5)

## 2014-06-01 LAB — HEPATIC FUNCTION PANEL
ALBUMIN: 3.9 g/dL (ref 3.5–5.2)
ALT: 20 U/L (ref 0–53)
AST: 31 U/L (ref 0–37)
Alkaline Phosphatase: 25 U/L — ABNORMAL LOW (ref 39–117)
Bilirubin, Direct: 0.2 mg/dL (ref 0.0–0.3)
Total Bilirubin: 0.8 mg/dL (ref 0.2–1.2)
Total Protein: 7 g/dL (ref 6.0–8.3)

## 2014-06-01 LAB — URIC ACID: Uric Acid, Serum: 3.9 mg/dL — ABNORMAL LOW (ref 4.0–7.8)

## 2014-06-01 LAB — VITAMIN D 25 HYDROXY (VIT D DEFICIENCY, FRACTURES): VITD: 59.31 ng/mL (ref 30.00–100.00)

## 2014-06-01 NOTE — Progress Notes (Signed)
Remote ICD transmission.   

## 2014-06-04 ENCOUNTER — Other Ambulatory Visit: Payer: Self-pay | Admitting: Pulmonary Disease

## 2014-06-04 DIAGNOSIS — M810 Age-related osteoporosis without current pathological fracture: Secondary | ICD-10-CM

## 2014-06-14 ENCOUNTER — Other Ambulatory Visit: Payer: Self-pay | Admitting: Pulmonary Disease

## 2014-06-15 ENCOUNTER — Ambulatory Visit (INDEPENDENT_AMBULATORY_CARE_PROVIDER_SITE_OTHER)
Admission: RE | Admit: 2014-06-15 | Discharge: 2014-06-15 | Disposition: A | Discharge status: Home / Self Care | Payer: Medicare Other | Source: Ambulatory Visit | Attending: Pulmonary Disease | Admitting: Pulmonary Disease

## 2014-06-15 DIAGNOSIS — M810 Age-related osteoporosis without current pathological fracture: Secondary | ICD-10-CM

## 2014-06-17 ENCOUNTER — Other Ambulatory Visit: Payer: Self-pay | Admitting: Pulmonary Disease

## 2014-06-17 MED ORDER — ALENDRONATE SODIUM 70 MG PO TABS: 70.0000 mg | ORAL_TABLET | ORAL | Status: DC

## 2014-06-19 ENCOUNTER — Other Ambulatory Visit: Payer: Self-pay | Admitting: Pulmonary Disease

## 2014-06-22 ENCOUNTER — Encounter: Payer: Self-pay | Admitting: Cardiology

## 2014-06-26 ENCOUNTER — Other Ambulatory Visit: Payer: Self-pay | Admitting: Pulmonary Disease

## 2014-07-01 ENCOUNTER — Encounter: Payer: Self-pay | Admitting: Internal Medicine

## 2014-07-06 ENCOUNTER — Telehealth: Payer: Self-pay | Admitting: Pulmonary Disease

## 2014-07-06 NOTE — Telephone Encounter (Signed)
Pt has been placed on Injection schedule tomorrow. Nothing more needed at this time.

## 2014-07-07 ENCOUNTER — Ambulatory Visit (INDEPENDENT_AMBULATORY_CARE_PROVIDER_SITE_OTHER): Payer: Medicare Other

## 2014-07-07 DIAGNOSIS — Z23 Encounter for immunization: Secondary | ICD-10-CM | POA: Diagnosis not present

## 2014-08-17 ENCOUNTER — Encounter: Payer: Self-pay | Admitting: Internal Medicine

## 2014-08-17 ENCOUNTER — Ambulatory Visit (INDEPENDENT_AMBULATORY_CARE_PROVIDER_SITE_OTHER): Payer: Medicare Other | Admitting: Internal Medicine

## 2014-08-17 VITALS — BP 124/64 | HR 62 | Ht 71.0 in | Wt 189.0 lb

## 2014-08-17 DIAGNOSIS — Z4502 Encounter for adjustment and management of automatic implantable cardiac defibrillator: Secondary | ICD-10-CM

## 2014-08-17 DIAGNOSIS — I2510 Atherosclerotic heart disease of native coronary artery without angina pectoris: Secondary | ICD-10-CM

## 2014-08-17 DIAGNOSIS — I255 Ischemic cardiomyopathy: Secondary | ICD-10-CM

## 2014-08-17 DIAGNOSIS — I2589 Other forms of chronic ischemic heart disease: Secondary | ICD-10-CM

## 2014-08-17 LAB — MDC_IDC_ENUM_SESS_TYPE_INCLINIC
Battery Voltage: 2.7 V
Brady Statistic AP VP Percent: 0 %
Brady Statistic AS VP Percent: 0 %
Brady Statistic RA Percent Paced: 19.27 %
Brady Statistic RV Percent Paced: 0 %
HIGH POWER IMPEDANCE MEASURED VALUE: 48 Ohm
HighPow Impedance: 68 Ohm
Lead Channel Impedance Value: 494 Ohm
Lead Channel Impedance Value: 494 Ohm
Lead Channel Pacing Threshold Amplitude: 1 V
Lead Channel Pacing Threshold Amplitude: 1 V
Lead Channel Pacing Threshold Pulse Width: 0.4 ms
Lead Channel Sensing Intrinsic Amplitude: 2.375 mV
Lead Channel Sensing Intrinsic Amplitude: 6.125 mV
Lead Channel Setting Pacing Pulse Width: 0.4 ms
MDC IDC MSMT LEADCHNL RA SENSING INTR AMPL: 2.375 mV
MDC IDC MSMT LEADCHNL RV PACING THRESHOLD PULSEWIDTH: 0.4 ms
MDC IDC MSMT LEADCHNL RV SENSING INTR AMPL: 5.5 mV
MDC IDC SESS DTM: 20151027142650
MDC IDC SET LEADCHNL RA PACING AMPLITUDE: 2 V
MDC IDC SET LEADCHNL RV PACING AMPLITUDE: 2.5 V
MDC IDC SET LEADCHNL RV SENSING SENSITIVITY: 0.6 mV
MDC IDC SET ZONE DETECTION INTERVAL: 260 ms
MDC IDC SET ZONE DETECTION INTERVAL: 350 ms
MDC IDC SET ZONE DETECTION INTERVAL: 400 ms
MDC IDC STAT BRADY AP VS PERCENT: 19.27 %
MDC IDC STAT BRADY AS VS PERCENT: 80.73 %
Zone Setting Detection Interval: 300 ms
Zone Setting Detection Interval: 360 ms

## 2014-08-17 NOTE — Patient Instructions (Signed)

## 2014-08-17 NOTE — Progress Notes (Signed)
Patient Care Team: Noralee Space, MD as PCP - General (Pulmonary Disease)   HPI  [name] is a 80 y.o. male seen in followup for ischemic heart disease modest depression of LV function with some interval improvement with ejection fraction of 40-45% by echo in April2011; he status post ICD implantation for primary prevention.   His he was shocked in June 2104  for T wave over sensing. His device was reprogrammed changing the sensitivity from 0.3--0.6. I should also note there has been intercurrent loss of R-wave amplitude from 5--3.5 mV   The patient denies chest pain, shortness of breath, nocturnal dyspnea, orthopnea or peripheral edema.  There have been no palpitations, lightheadedness or syncope.       Past Medical History  Diagnosis Date  . Allergic rhinitis   . COPD (chronic obstructive pulmonary disease)   . Tobacco abuse   . Hypertension   . Atherosclerotic heart disease   . Ischemic cardiomyopathy     EF improved to normal: Echo 8/14:  Mild LVH, focal basal hypertrophy, EF 60-65%, normal wall motion, mild BAE, PASP 36  . Syncope   . Presence of cardiac defibrillator   . Peripheral vascular disease   . Hypercholesterolemia   . Diverticulosis of colon   . History of colonic polyps   . Hemorrhoids   . Abnormal liver function tests   . History of gout   . Back pain   . Compression fracture   . Osteoporosis   . Dermatitis   . T wave over sensing resulting in inappropriate shocks 08/14/2013  . DJD (degenerative joint disease)     and Gout  . CAD (coronary artery disease)   . Myocardial infarction     Past Surgical History  Procedure Laterality Date  . Abdominal aortic aneurysm repair  2002    by Dr. Kellie Simmering  . Cad stent  02/2002    Dr. Percival Spanish  . Aicd placed  03/2008    Dr. Caryl Comes  . Right corotid enderectomy  12/2010    Dr. Scot Dock  . Carotid endarterectomy Right January 02, 2011  . Cardiac defibrillator placement  2010    Current Outpatient  Prescriptions  Medication Sig Dispense Refill  . ADVAIR DISKUS 250-50 MCG/DOSE AEPB INHALE 1 PUFF TWICE DAILY.  60 each  11  . alendronate (FOSAMAX) 70 MG tablet Take 1 tablet (70 mg total) by mouth once a week. Take with a full glass of water on an empty stomach.  12 tablet  4  . allopurinol (ZYLOPRIM) 100 MG tablet TAKE 1 TABLET ONCE DAILY.  30 tablet  5  . aspirin 81 MG tablet Take 81 mg by mouth every morning.       . carvedilol (COREG) 3.125 MG tablet TAKE 1 TABLET TWICE DAILY WITH A MEAL.  60 tablet  5  . fenofibrate 160 MG tablet TAKE 1 TABLET DAILY.  30 tablet  5  . Inulin (FIBERCHOICE PO) Take 2 tablets by mouth daily.       Marland Kitchen lisinopril (PRINIVIL,ZESTRIL) 5 MG tablet TAKE 1 TABLET ONCE DAILY.  30 tablet  6  . Multiple Vitamin (MULTIVITAMIN) capsule Take 1 capsule by mouth daily.      . Multiple Vitamins-Minerals (PRESERVISION AREDS 2 PO) Take 2 Cans by mouth daily.      . simvastatin (ZOCOR) 40 MG tablet TAKE ONE TABLET AT BEDTIME.  90 tablet  1  . VITAMIN D, CHOLECALCIFEROL, PO Take 2 capsules by mouth  daily.       No current facility-administered medications for this visit.    No Known Allergies  Review of Systems negative except from HPI and PMH  Physical Exam BP 124/64  Pulse 62  Ht 5\' 11"  (1.803 m)  Wt 189 lb (85.73 kg)  BMI 26.37 kg/m2 Well developed and nourished in no acute distress HENT normal Neck supple with JVP-flat occ wheeze Device pocket well healed; without hematoma or erythema.  There is no tethering Regular rate and rhythm, no murmurs or gallops Abd-soft with active BS No Clubbing cyanosis edema Skin-warm and dry senile keratosos A & Oriented  Grossly normal sensory and motor function  ECG sinus 62  19/16/49 Inferior MI    Assessment and  Plan  Implantable defibrillator  The patient's device was interrogated.  The information was reviewed. No changes were made in the programming.     Ischemic cardiiomyopathy  Without symptoms of  ischemia

## 2014-09-13 DIAGNOSIS — H3532 Exudative age-related macular degeneration: Secondary | ICD-10-CM | POA: Diagnosis not present

## 2014-09-15 DIAGNOSIS — H35371 Puckering of macula, right eye: Secondary | ICD-10-CM | POA: Diagnosis not present

## 2014-09-15 DIAGNOSIS — H3531 Nonexudative age-related macular degeneration: Secondary | ICD-10-CM | POA: Diagnosis not present

## 2014-09-21 ENCOUNTER — Other Ambulatory Visit: Payer: Self-pay | Admitting: Pulmonary Disease

## 2014-10-23 ENCOUNTER — Other Ambulatory Visit: Payer: Self-pay | Admitting: Cardiology

## 2014-10-28 DIAGNOSIS — H35351 Cystoid macular degeneration, right eye: Secondary | ICD-10-CM | POA: Diagnosis not present

## 2014-10-28 DIAGNOSIS — H35371 Puckering of macula, right eye: Secondary | ICD-10-CM | POA: Diagnosis not present

## 2014-10-28 DIAGNOSIS — H3581 Retinal edema: Secondary | ICD-10-CM | POA: Diagnosis not present

## 2014-11-05 DIAGNOSIS — H3531 Nonexudative age-related macular degeneration: Secondary | ICD-10-CM | POA: Diagnosis not present

## 2014-11-17 ENCOUNTER — Telehealth: Payer: Self-pay | Admitting: Cardiology

## 2014-11-17 ENCOUNTER — Encounter: Payer: Medicare Other | Admitting: *Deleted

## 2014-11-17 NOTE — Telephone Encounter (Signed)
Attempted to confirm remote transmission with pt. No answer and was unable to leave a message.   

## 2014-11-18 ENCOUNTER — Encounter: Payer: Self-pay | Admitting: Cardiology

## 2014-11-25 ENCOUNTER — Other Ambulatory Visit: Payer: Self-pay | Admitting: Cardiology

## 2014-11-26 DIAGNOSIS — H35371 Puckering of macula, right eye: Secondary | ICD-10-CM | POA: Diagnosis not present

## 2014-12-02 ENCOUNTER — Ambulatory Visit (INDEPENDENT_AMBULATORY_CARE_PROVIDER_SITE_OTHER): Payer: Medicare Other | Admitting: *Deleted

## 2014-12-02 DIAGNOSIS — I255 Ischemic cardiomyopathy: Secondary | ICD-10-CM

## 2014-12-02 LAB — MDC_IDC_ENUM_SESS_TYPE_INCLINIC
Battery Voltage: 2.65 V
Brady Statistic AP VP Percent: 0 %
Brady Statistic AP VS Percent: 20.55 %
Brady Statistic AS VP Percent: 0 %
Brady Statistic AS VS Percent: 79.45 %
Brady Statistic RA Percent Paced: 20.55 %
Brady Statistic RV Percent Paced: 0 %
HIGH POWER IMPEDANCE MEASURED VALUE: 70 Ohm
HighPow Impedance: 47 Ohm
Lead Channel Impedance Value: 475 Ohm
Lead Channel Impedance Value: 532 Ohm
Lead Channel Pacing Threshold Amplitude: 0.75 V
Lead Channel Pacing Threshold Amplitude: 0.75 V
Lead Channel Pacing Threshold Pulse Width: 0.4 ms
Lead Channel Sensing Intrinsic Amplitude: 11.625 mV
Lead Channel Sensing Intrinsic Amplitude: 2.375 mV
Lead Channel Sensing Intrinsic Amplitude: 2.75 mV
Lead Channel Sensing Intrinsic Amplitude: 5.25 mV
Lead Channel Setting Pacing Pulse Width: 0.4 ms
MDC IDC MSMT LEADCHNL RV PACING THRESHOLD PULSEWIDTH: 0.4 ms
MDC IDC SESS DTM: 20160211102136
MDC IDC SET LEADCHNL RA PACING AMPLITUDE: 2 V
MDC IDC SET LEADCHNL RV PACING AMPLITUDE: 2.5 V
MDC IDC SET LEADCHNL RV SENSING SENSITIVITY: 0.6 mV
MDC IDC SET ZONE DETECTION INTERVAL: 350 ms
MDC IDC SET ZONE DETECTION INTERVAL: 400 ms
Zone Setting Detection Interval: 260 ms
Zone Setting Detection Interval: 300 ms
Zone Setting Detection Interval: 360 ms

## 2014-12-02 NOTE — Progress Notes (Signed)
ICD check in clinic. Normal device function. Thresholds and sensing consistent with previous device measurements. Impedance trends stable over time. No evidence of any ventricular arrhythmias. No mode switches. Histogram distribution appropriate for patient and level of activity. No changes made this session. Device programmed at appropriate safety margins. Device programmed to optimize intrinsic conduction.  Pt enrolled in remote follow-up. Plan to check device every 3 months remotely and in office annually. Patient education completed including shock plan. Alert tones/vibration demonstrated for patient.  Battery nearing ERI.  [facility] 01/04/15 for battery check only.

## 2014-12-20 ENCOUNTER — Other Ambulatory Visit: Payer: Self-pay | Admitting: Pulmonary Disease

## 2014-12-23 ENCOUNTER — Encounter: Payer: Self-pay | Admitting: Internal Medicine

## 2015-01-04 ENCOUNTER — Encounter: Payer: Medicare Other | Admitting: *Deleted

## 2015-01-06 ENCOUNTER — Encounter: Payer: Self-pay | Admitting: Cardiology

## 2015-01-07 DIAGNOSIS — H35371 Puckering of macula, right eye: Secondary | ICD-10-CM | POA: Diagnosis not present

## 2015-01-12 ENCOUNTER — Ambulatory Visit (INDEPENDENT_AMBULATORY_CARE_PROVIDER_SITE_OTHER): Payer: Medicare Other | Admitting: *Deleted

## 2015-01-12 DIAGNOSIS — I255 Ischemic cardiomyopathy: Secondary | ICD-10-CM

## 2015-01-18 NOTE — Progress Notes (Signed)
Remote ICD transmission.   

## 2015-01-19 LAB — MDC_IDC_ENUM_SESS_TYPE_REMOTE
Battery Voltage: 2.64 V
Brady Statistic AP VS Percent: 22.44 %
Brady Statistic AS VP Percent: 0 %
Brady Statistic AS VS Percent: 77.56 %
Brady Statistic RA Percent Paced: 22.44 %
Brady Statistic RV Percent Paced: 0 %
Date Time Interrogation Session: 20160323153850
HIGH POWER IMPEDANCE MEASURED VALUE: 49 Ohm
HighPow Impedance: 69 Ohm
Lead Channel Impedance Value: 532 Ohm
Lead Channel Sensing Intrinsic Amplitude: 2.5 mV
Lead Channel Sensing Intrinsic Amplitude: 9.625 mV
Lead Channel Setting Pacing Amplitude: 2 V
Lead Channel Setting Pacing Amplitude: 2.5 V
Lead Channel Setting Pacing Pulse Width: 0.4 ms
Lead Channel Setting Sensing Sensitivity: 0.6 mV
MDC IDC MSMT LEADCHNL RA IMPEDANCE VALUE: 494 Ohm
MDC IDC MSMT LEADCHNL RA SENSING INTR AMPL: 2.5 mV
MDC IDC MSMT LEADCHNL RV SENSING INTR AMPL: 9.625 mV
MDC IDC SET ZONE DETECTION INTERVAL: 300 ms
MDC IDC SET ZONE DETECTION INTERVAL: 350 ms
MDC IDC SET ZONE DETECTION INTERVAL: 400 ms
MDC IDC STAT BRADY AP VP PERCENT: 0 %
Zone Setting Detection Interval: 260 ms
Zone Setting Detection Interval: 360 ms

## 2015-01-20 ENCOUNTER — Other Ambulatory Visit: Payer: Self-pay | Admitting: Pulmonary Disease

## 2015-01-26 ENCOUNTER — Encounter: Payer: Self-pay | Admitting: Cardiology

## 2015-02-02 ENCOUNTER — Encounter: Payer: Self-pay | Admitting: Internal Medicine

## 2015-02-24 ENCOUNTER — Encounter: Payer: Self-pay | Admitting: Cardiology

## 2015-02-24 ENCOUNTER — Ambulatory Visit (INDEPENDENT_AMBULATORY_CARE_PROVIDER_SITE_OTHER): Payer: Medicare Other | Admitting: Cardiology

## 2015-02-24 VITALS — BP 156/70 | HR 68 | Ht 71.0 in | Wt 193.6 lb

## 2015-02-24 DIAGNOSIS — I255 Ischemic cardiomyopathy: Secondary | ICD-10-CM | POA: Diagnosis not present

## 2015-02-24 NOTE — Patient Instructions (Addendum)
STOP Fenofibrate     Dr.Hochrein wants you to follow-up in: ONE YEAR. You will receive a reminder letter in the mail two months in advance. If you don't receive a letter, please call our office to schedule the follow-up appointment.

## 2015-02-24 NOTE — Progress Notes (Signed)
HPI The patient has had followup since he had a firing of his defibrillator. He had a slightly abnormal exercise treadmill test and then was followed up with a Lexiscan Myoview 05/21/13  This demonstrated intermediate risk, large posterior lateral infarct, no ischemia, EF 26%.  Most recent echo demonstrated an EF of 60%.  He is back for yearly follow-up with me and has continued to do well. The patient denies any new symptoms such as chest discomfort, neck or arm discomfort. There has been no new shortness of breath, PND or orthopnea. There have been no reported palpitations, presyncope or syncope.  He does pedal a bicycle about 5-6 minutes 5 days per week. He stops and he gets short of breath.   No Known Allergies  Current Outpatient Prescriptions  Medication Sig Dispense Refill  . ADVAIR DISKUS 250-50 MCG/DOSE AEPB INHALE 1 PUFF TWICE DAILY. 60 each 11  . allopurinol (ZYLOPRIM) 100 MG tablet TAKE 1 TABLET ONCE DAILY. 30 tablet 3  . aspirin 81 MG tablet Take 81 mg by mouth every morning.     . carvedilol (COREG) 3.125 MG tablet TAKE 1 TABLET TWICE DAILY WITH A MEAL. 60 tablet 6  . fenofibrate 160 MG tablet TAKE 1 TABLET DAILY. 30 tablet 3  . lisinopril (PRINIVIL,ZESTRIL) 5 MG tablet TAKE 1 TABLET ONCE DAILY. 30 tablet 2  . Multiple Vitamin (MULTIVITAMIN) capsule Take 1 capsule by mouth daily.    . psyllium (METAMUCIL) 58.6 % powder Take by mouth daily.    . simvastatin (ZOCOR) 40 MG tablet TAKE ONE TABLET AT BEDTIME. 90 tablet 1  . VITAMIN D, CHOLECALCIFEROL, PO Take 2 capsules by mouth daily.     No current facility-administered medications for this visit.    Past Medical History  Diagnosis Date  . Allergic rhinitis   . COPD (chronic obstructive pulmonary disease)   . Tobacco abuse   . Hypertension   . Atherosclerotic heart disease   . Ischemic cardiomyopathy     EF improved to normal: Echo 8/14:  Mild LVH, focal basal hypertrophy, EF 60-65%, normal wall motion, mild BAE, PASP 36    . Syncope   . Presence of cardiac defibrillator   . Peripheral vascular disease   . Hypercholesterolemia   . Diverticulosis of colon   . History of colonic polyps   . Hemorrhoids   . Abnormal liver function tests   . History of gout   . Back pain   . Compression fracture   . Osteoporosis   . Dermatitis   . T wave over sensing resulting in inappropriate shocks 08/14/2013  . DJD (degenerative joint disease)     and Gout  . CAD (coronary artery disease)   . Myocardial infarction     Past Surgical History  Procedure Laterality Date  . Abdominal aortic aneurysm repair  2002    by Dr. Kellie Simmering  . Cad stent  02/2002    Dr. Percival Spanish  . Aicd placed  03/2008    Dr. Caryl Comes  . Right corotid enderectomy  12/2010    Dr. Scot Dock  . Carotid endarterectomy Right January 02, 2011  . Cardiac defibrillator placement  2010    ROS: Left foot pain.  Otherwise as stated in the HPI and negative for all other systems.  PHYSICAL EXAM BP 156/70 mmHg  Pulse 68  Ht 5\' 11"  (1.803 m)  Wt 193 lb 9.6 oz (87.816 kg)  BMI 27.01 kg/m2 GENERAL:  Gaunt appearing, no distress HEENT: Pupil reactive, fundi not visualized,  oral mucosa unremarkable NECK:  No jugular venous distention, waveform within normal limits, carotid upstroke brisk and symmetric, no bruits, no thyromegaly LUNGS:  Clear to auscultation bilaterally BACK:  No CVA tenderness CHEST:  ICD pocket intact HEART:  PMI not displaced or sustained,S1 and S2 within normal limits, no S3, no S4, no clicks, no rubs, no murmurs ABD:  Flat, positive bowel sounds normal in frequency in pitch, no bruits, no rebound, no guarding, no midline pulsatile mass, no hepatomegaly, no splenomegaly EXT:  2 plus pulses throughout, no edema, no cyanosis no clubbing  EKG:  Sinus rhythm, rate 68, RBBB, PVC, old inferior infarct.  No change from previous.  02/24/2015  ASSESSMENT AND PLAN  CARDIOMYOPATHY:  He seems to be euvolemic.  No change in therapy is indicated.   No  further imaging is indicated. His last ejection fraction was actually well preserved.  TOBACCO USE:  He is still not smoking.  CAROTID STENOSIS:  He has followed up with Dr. Scot Dock and is scheduled for follow-up in August.  HTN:   The blood pressure is only mildly elevated today.  However, it is not typically elevated. He will keep a blood pressure diary.  CAD:   He has had no new symptoms.  No further testing is indicated.   ICD:   He had followup as above and has had no more events. No further change in therapy is indicated.  DYSLIPIDEMIA:  Per recent FDA guideline changes I will discontinue his fenofibrate while he is on a statin since the indication was low HDL.

## 2015-03-03 ENCOUNTER — Ambulatory Visit (INDEPENDENT_AMBULATORY_CARE_PROVIDER_SITE_OTHER): Payer: Medicare Other | Admitting: *Deleted

## 2015-03-03 DIAGNOSIS — I255 Ischemic cardiomyopathy: Secondary | ICD-10-CM | POA: Diagnosis not present

## 2015-03-03 NOTE — Progress Notes (Signed)
Remote ICD transmission.   

## 2015-03-04 LAB — CUP PACEART REMOTE DEVICE CHECK
Battery Voltage: 2.63 V
Brady Statistic AP VP Percent: 0 %
Brady Statistic AP VS Percent: 24.67 %
Brady Statistic AS VP Percent: 0 %
Brady Statistic AS VS Percent: 75.33 %
Brady Statistic RA Percent Paced: 24.67 %
Brady Statistic RV Percent Paced: 0 %
HIGH POWER IMPEDANCE MEASURED VALUE: 67 Ohm
HighPow Impedance: 47 Ohm
Lead Channel Impedance Value: 475 Ohm
Lead Channel Impedance Value: 494 Ohm
Lead Channel Sensing Intrinsic Amplitude: 2.5 mV
Lead Channel Sensing Intrinsic Amplitude: 7 mV
Lead Channel Setting Pacing Amplitude: 2 V
Lead Channel Setting Pacing Pulse Width: 0.4 ms
Lead Channel Setting Sensing Sensitivity: 0.6 mV
MDC IDC MSMT LEADCHNL RA SENSING INTR AMPL: 2.5 mV
MDC IDC MSMT LEADCHNL RV SENSING INTR AMPL: 7 mV
MDC IDC SESS DTM: 20160512154440
MDC IDC SET LEADCHNL RV PACING AMPLITUDE: 2.5 V
Zone Setting Detection Interval: 260 ms
Zone Setting Detection Interval: 300 ms
Zone Setting Detection Interval: 350 ms
Zone Setting Detection Interval: 360 ms
Zone Setting Detection Interval: 400 ms

## 2015-03-11 DIAGNOSIS — H3531 Nonexudative age-related macular degeneration: Secondary | ICD-10-CM | POA: Diagnosis not present

## 2015-03-11 DIAGNOSIS — H35371 Puckering of macula, right eye: Secondary | ICD-10-CM | POA: Diagnosis not present

## 2015-03-14 ENCOUNTER — Encounter: Payer: Self-pay | Admitting: Cardiology

## 2015-03-22 ENCOUNTER — Encounter: Payer: Self-pay | Admitting: Internal Medicine

## 2015-04-04 ENCOUNTER — Ambulatory Visit (INDEPENDENT_AMBULATORY_CARE_PROVIDER_SITE_OTHER): Payer: Medicare Other | Admitting: *Deleted

## 2015-04-04 ENCOUNTER — Other Ambulatory Visit: Payer: Self-pay | Admitting: Pulmonary Disease

## 2015-04-04 DIAGNOSIS — I255 Ischemic cardiomyopathy: Secondary | ICD-10-CM

## 2015-04-04 NOTE — Progress Notes (Signed)
Remote ICD transmission.   

## 2015-04-08 LAB — CUP PACEART REMOTE DEVICE CHECK
Brady Statistic AS VP Percent: 0 %
Brady Statistic RV Percent Paced: 0 %
Date Time Interrogation Session: 20160613143727
HighPow Impedance: 46 Ohm
HighPow Impedance: 66 Ohm
Lead Channel Impedance Value: 475 Ohm
Lead Channel Impedance Value: 494 Ohm
Lead Channel Sensing Intrinsic Amplitude: 5.375 mV
Lead Channel Sensing Intrinsic Amplitude: 5.375 mV
Lead Channel Setting Sensing Sensitivity: 0.6 mV
MDC IDC MSMT BATTERY VOLTAGE: 2.63 V
MDC IDC MSMT LEADCHNL RA SENSING INTR AMPL: 2.375 mV
MDC IDC MSMT LEADCHNL RA SENSING INTR AMPL: 2.375 mV
MDC IDC SET LEADCHNL RA PACING AMPLITUDE: 2 V
MDC IDC SET LEADCHNL RV PACING AMPLITUDE: 2.5 V
MDC IDC SET LEADCHNL RV PACING PULSEWIDTH: 0.4 ms
MDC IDC SET ZONE DETECTION INTERVAL: 300 ms
MDC IDC SET ZONE DETECTION INTERVAL: 350 ms
MDC IDC SET ZONE DETECTION INTERVAL: 360 ms
MDC IDC STAT BRADY AP VP PERCENT: 0 %
MDC IDC STAT BRADY AP VS PERCENT: 32.27 %
MDC IDC STAT BRADY AS VS PERCENT: 67.73 %
MDC IDC STAT BRADY RA PERCENT PACED: 32.27 %
Zone Setting Detection Interval: 260 ms
Zone Setting Detection Interval: 400 ms

## 2015-04-18 ENCOUNTER — Other Ambulatory Visit: Payer: Self-pay | Admitting: Pulmonary Disease

## 2015-04-27 ENCOUNTER — Ambulatory Visit (INDEPENDENT_AMBULATORY_CARE_PROVIDER_SITE_OTHER)
Admission: RE | Admit: 2015-04-27 | Discharge: 2015-04-27 | Disposition: A | Discharge status: Home / Self Care | Payer: Medicare Other | Source: Ambulatory Visit | Attending: Internal Medicine | Admitting: Internal Medicine

## 2015-04-27 ENCOUNTER — Encounter (INDEPENDENT_AMBULATORY_CARE_PROVIDER_SITE_OTHER): Payer: Self-pay

## 2015-04-27 ENCOUNTER — Ambulatory Visit (INDEPENDENT_AMBULATORY_CARE_PROVIDER_SITE_OTHER): Payer: Medicare Other | Admitting: Internal Medicine

## 2015-04-27 ENCOUNTER — Encounter: Payer: Self-pay | Admitting: Internal Medicine

## 2015-04-27 VITALS — BP 112/74 | HR 78 | Temp 98.1°F | Ht 71.0 in | Wt 185.0 lb

## 2015-04-27 DIAGNOSIS — J449 Chronic obstructive pulmonary disease, unspecified: Secondary | ICD-10-CM | POA: Diagnosis not present

## 2015-04-27 DIAGNOSIS — R0602 Shortness of breath: Secondary | ICD-10-CM | POA: Diagnosis not present

## 2015-04-27 DIAGNOSIS — J189 Pneumonia, unspecified organism: Secondary | ICD-10-CM | POA: Diagnosis not present

## 2015-04-27 DIAGNOSIS — I255 Ischemic cardiomyopathy: Secondary | ICD-10-CM | POA: Diagnosis not present

## 2015-04-27 DIAGNOSIS — R05 Cough: Secondary | ICD-10-CM | POA: Diagnosis not present

## 2015-04-27 MED ORDER — PREDNISONE 10 MG PO TABS: ORAL_TABLET | ORAL | Status: DC

## 2015-04-27 MED ORDER — AZITHROMYCIN 250 MG PO TABS: ORAL_TABLET | ORAL | Status: DC

## 2015-04-27 NOTE — Progress Notes (Signed)
Subjective:    Patient ID: [name], male    DOB: 08/10/1931, 81 y.o.   MRN: [id number]  HPI 81 y/o WM here for a follow up visit... he has been followed closely by DrHochrein & DrKlein during the past years due to his cardiomyopathy & AICD...  ~  SEE PREV EPIC NOTES FOR THE EARLIER DATA >>  ~  October 30, 2012:  6mo ROV & [name] presents w/ a 2wk hx URI starting w/ low grade temp, chills, & feeling cold/ feeling bad; then developed cough w/ thick yellow phlegm and chest congestion- no hemoptysis, no CP x sore from coughing; he tried OTC meds w/o benefit & progressed w/ incr SOB prompting him to call EMS & go to the ER 1/7;  ER eval reviewed: congested w/ rhonchi on exam; T99.2 RR=21 O2sat=93%; CXR was clear & CBC- ok w/ WBC=10.4; treated w/ O2, NEB, IV fluid and improved...     Today his exam shows mod cough, exp wheezing & rhonchi, no consolidation, thick yellow sput, etc; We discussed Acute on Chronic COPD and treatment w/ Levaquin, Depo80, Prednisone taper, Advair250, Mucinex w/ Fluids, Hycodan as needed etc... We will plan ROV recheck in 1 mo.    We reviewed prob list, meds, xrays and labs> see below for updates >>   ~  December 09, 2012:  6wk ROV & recheck> [name] responded nicely to our Rx w/ Pred, Levaquin, Advair250, Mucinex, etc;  He has since stopped all the meds including the Advair- we discussed this & the reason for the long term use of the Advair250- one inhalation Bid & he agrees to continue; plus the Mucinex prn for thick phlegm & Proair as needed...    We reviewed his other medical issues today- BP controlled on meds; Cardiac stable & he gets pacer checks w/ DrKlein; Cerebrovasc f/u DrDickson yearly for Dopplers due this spring; needs to ret fasting for labs...  ~  June 08, 2013:  6mo ROV & [name] recently ret from a trip to [place] & had several inappropriate shocks from his AICD- checked by Cards (followed by Hochrein & Klein) 7/14=> Twave oversensing & devise adjusted;  He had  Treadmill test 7/14- no ischemic changes & no T wave oversensing but BP dropped therefore he had Myoview as well 7/14 intermed risk w/ large posterolat infarct, no ischemia, EF=26% (worse) w/ diffuse HK of posterolat wall;  He's had some weight gain he attrib to smoking cessation, BNP=156;  He had f/u 2DEcho & curiously this revealed mild LVH, focal basal hypertrophy, norm LVF w/ EF=60-65% & no regional wall motion abn, mild LA&RA dil => he has f/u DrHochrein soon... We reviewed the following medical problems during today's office visit >>     COPD> on Advair250Bid & Proair for prn use; doing better w/ smoking cessation...    HBP> on Coreg3.125Bid & Lisinopril5; BP= 102/60 & denies CP, palpit, dizzy/syncope, ch in SOB/DOE, edema...    ASHD/ Cardiomyopathy> on ASA 81mg/d; improved w/ BBlocker/ ACE rx, not requiring diuretic and denies CP/ angina, etc; followed by DrHochrein; Treadmill, Myoview, 2DEcho 7-8/14 reviewed & ?EF via Echo?    AICD w/ hx Twave oversensing 7/14 requiring AICD device adjustment by DrKlein...    Periph Vasc Dis> AAA repair 2000 by DrLawson, still too sedentary & he declined cardiac rehab; s/p R CAE w/ DPA 3/12 by DrCDickson; f/u CDoppler by [facility] 4/14 showed patent rightCAE site & <40% left ICA stenosis w/ calcif plaque; they plan regular follow up...      CHOL> on Simva40 + Fenofib160; FLP 8/14 shows TChol 122, TG 88, HDL 40, LDL 65; continue same...    GI> GERD, Divertics, Polyps,etc>  GI reported stable on incr Fiber intake; his CC= gas pains & we rec Mylicon, Phazyme etc...    DJD, osteoporosis, compression fx>  Vit D level is low at 19 & rec to take Calcium, Men's formula MVI, & he never tool the 50K Rx; Rec- OTC 2000u daily...    DERM> he saw Derm re: his rash (?seb dermatitis) and itching; labs show 22% eos & rec to take Antihist eg Benedryl, Allegra, Zyrkek daily... We reviewed prob list, meds, xrays and labs> see below for updates >>   LABS 8/14:  FLP- at goals on  Simva40+Feno160;  Chems- wnl;  CBC- wnl x Plat=112;  TSH=2.42;  VitD=19;  PSA=1.66...  ~  May 31, 2014:  1yr ROV & [name] reports doing satis, he missed 6mo ROV due to the weather in Feb & was doing well so he put it off til now;  No new complaints or concerns- he notes that his breathing is good, denies any signif cough, sput, SOB, etc; he's been active doing all the work around the house esp since wife is very limited in activity due to her COPD; he further denies CP, palpit, edema; his only complaint on ROS is some back pain after strenuous exercise & it resolves w/ a little rest... We reviewed the following medical problems during today's office visit >>     COPD> on Advair250Bid & Proair for prn use; doing better w/ complete smoking cessation; PFTs show GOLD Stage3 COPD w/ FEV1=1.56 (47%); he is relatively asymptomatic & doesn't want to add additional meds.     HBP> on Coreg3.125Bid & Lisinopril5; BP= 128/64 & denies CP, palpit, dizzy/syncope, ch in SOB/DOE, edema...    ASHD/ Cardiomyopathy> on ASA 81mg/d; improved w/ BBlocker/ ACE rx, not requiring diuretic and denies CP/ angina, etc; followed by DrHochrein- Treadmill, Myoview, 2DEcho 7-8/14 reviewed & EF=26% by Myoview & 60% by 2DEcho...     AICD w/ hx Twave oversensing 7/14 requiring AICD device adjustment by DrKlein...    Periph Vasc Dis> AAA repair 2000 by DrLawson, still too sedentary & he declined cardiac rehab; s/p R CAE w/ DPA 3/12 by DrCDickson; f/u CDoppler by [facility] 8/15 showed patent rightCAE site & <40% left ICA stenosis w/ calcif plaque; they plan yearly follow up...    CHOL> on Simva40 + Fenofib160; FLP 8/15 shows TChol 114, TG 139, HDL 33, LDL 53; continue same & incr exercise...    GI> GERD, Divertics, Polyps,etc>  GI reported stable on incr Fiber intake; prev gas pains improved w/ Mylicon, Phazyme etc...    DJD, osteoporosis, compression fx> on Calcium, Men's formula MVI & OTC VitD 2000u daily; Labs 8/15 showed Vit D level = 59; CXR  w/ new part T10 compression=> needs f/u BMD & consider med rx...    DERM> he saw Derm re: his ?seb dermatitis and itching- improved on Rx; labs show 20% eos & rec to take Antihist eg Benedryl, Allegra, Zyrkek daily... We reviewed prob list, meds, xrays and labs> see below for updates >>   CXR 8/15 showed Cardiomeg & AICD w/o change, clear lungs/ NAD, old right rib fxs & new T10 compression, osteopenia => needs repeat BMD & consideration of meds.  PFT 8/15 showed FVC=2.72 (61%), FEV1=1.56 (47%), %1sec=57, mid-flows=31% predicted; c/w GOLD Stage3 COPD...  LABS 8/15:  FLP- at goals on Simva40+Feno160 x HDL=33;    Chems- ok w/ Cr=1.3;  CBC- ok w/ Hg=13.9 but MCV=105, Plat=96K & Eos=20%;  TSH=2.26;  Uric=3.9 on Allopurinol;  VitD=59 on OTC supplement...  ADDENDUM>> DrHochrein reviewed his 44 2DEcho & Myoview>> he feels that EF is ~45%... ADDENDUM>> BMD 06/15/14 showed lowest Tscore -3.2 in right [facility]; discussed w/ pt need for bone building Rx- start ALENDRONATE 19m/wk... Called to GMirant   04/27/2015 acute  ov/[name] re:  Cough /Jesse LevineChief Complaint  Patient presents with  . Acute Visit    Pt c/o "head cold"- started 6 days ago and now has moved to chest. He c/o prod cough with brown to yellow sputum, wheezing, and chills.     no sob or rigors or n or v.  maint on adviar and acei/ coreg with chronic cough/ raspy throat but really Not limited by breathing from desired activities   No obvious day to day or daytime variabilty or assoc cp or chest tightness, or overt   hb symptoms. No unusual exp hx or h/o childhood pna/ asthma or knowledge of premature birth.  Sleeping ok without nocturnal  or early am exacerbation  of respiratory  c/o's or need for noct saba. Also denies any obvious fluctuation of symptoms with weather or environmental changes or other aggravating or alleviating factors except as outlined above   Current Medications, Allergies, Complete Past Medical History, Past Surgical  History, Family History, and Social History were reviewed in [employer] Energyrecord.  ROS  The following are not active complaints unless bolded sore throat, dysphagia, dental problems, itching, sneezing,  nasal congestion or excess/ purulent secretions, ear ache,   fever, chills, sweats, unintended wt loss, pleuritic or exertional cp, hemoptysis,  orthopnea pnd or leg swelling, presyncope, palpitations, abdominal pain, anorexia, nausea, vomiting, diarrhea  or change in bowel or urinary habits, change in stools or urine, dysuria,hematuria,  rash, arthralgias, visual complaints, headache, numbness weakness or ataxia or problems with walking or coordination,  change in mood/affect or memory.                 Problem List:  COPD ([id number] - despite all efforts JReedcontinues to smoke 3-4 cigars per day... he has min cough, some phlegm, but denies CP, SOB, wheezing, etc...  He doesn't want Chantix or help w/ smoking cessation;  He has a PROAIR inhaler for Prn use but he seldom uses it, & he knows to use the OTC MUCINEX 1-2 Bid w/ fluids for congestion. ~  CXR 3/12 in hosp for CAE showed COPD/E, biapical pleuroparenchymal scarring, NAD, AICD on left, old left rib fx, old T12 vertebroplasty... ~  Fall at home 4/12 w/ signif trauma & prob right rib fxs (CXR in ER showed some atelec but NAD & no rib films done). ~  1/14: presents w/ URI & acute on chr COPD exac; CXR in ER showed norm heart size, clear lungs, AICD in place, compression T12 w/ augmentation. ~  2/14: he responded nicely to Levaquin, Pred, Advair250, Mucinex, etc; asked to STAY on the [id number] Bid; he reports quitting the cigars! ~  8/14: on Advair250Bid & Proair for prn use; doing better w/ smoking cessation... ~  8/15:   on Advair250Bid & Proair for prn use; doing better w/ complete smoking cessation; PFTs show GOLD Stage3 COPD w/ FEV1=1.56 (47%); he is relatively asymptomatic & doesn't want to add additional meds. ~   CXR 8/15 showed Cardiomeg & AICD w/o change, clear lungs/ NAD, old right rib fxs & new T10  compression, osteopenia => needs repeat BMD & consideration of meds. ~  PFT 8/15 showed FVC=2.72 (61%), FEV1=1.56 (47%), %1sec=57, mid-flows=31% predicted; c/w GOLD Stage3 COPD...   HYPERTENSION (ICD-401.9) - back on COREG 3.125Bid & LISINOPRIL 45mQhs... Prev had to wean off these due to dizziness & post hypotension (resolved off Etoh). ~  12/11:  BP= 126/70 and doing well> denies HA, fatigue, visual changes, CP, palipit, dizziness, dyspnea, edema, etc; he has had postural hypotension & several syncopal episodes related to this- now improved w/ the final adjustment in his meds. ~  4/12:  BP= 90/58 ==>100/60 recheck & he is weak; asked to monitor BP at home & may need to decr meds. ~  5/12:  BP= 130/70 supine & 100/60 sitting & standing (no symptoms at present)> he has been off the Lisinopril & Coreg for 1wk now. ~  7/12:  BP= 120/72 & 140/70 w/o postural changes (improved off etoh); he is back on Lisinopril 525mQhs per Cards. ~  12/12:  BP= 128/74 on Coreg3.125Bid & Lisinopril5; denies CP, palpit, dizzy/syncope, ch in SOB/DOE, edema... ~  6/13:  BP= 122/68 & he remains largely asymptomatic... ~  1/14:  BP= 120/58 & he is here w/ acute on chr COPD exac; denies CP, palpit, etc... ~  8/14:  on Coreg3.125Bid & Lisinopril5; BP= 102/60 & denies CP, palpit, dizzy/syncope, ch in SOB/DOE, edema... ~  8/15:  on Coreg3.125Bid & Lisinopril5; BP= 128/64 & denies CP, palpit, dizzy/syncope, ch in SOB/DOE, edema...  ATHEROSCLEROTIC HEART DISEASE (ICD-414.00) - on ASA 8132m + above meds... ISCHEMIC CARDIOMYOPATHY (ICD-414.8) Hx of SYNCOPE (ICD-780.2) & IMPLANTABLE DEFIBRILLATOR, DDD MDT (ICD-V45.02) ~  Cath in 2003 w/ 2 vessel CAD and stent placed in LAD...  ~  Cardiolite 1/08 w/ large inferolat infarct, no ischemia, EF=36%...  ~  2DEcho 2/08 w/ infer & post HK, EF=40%...  ~  recath 6/09 w/ heavily calcif vessels & 40%  EF- DrHochrein has been following carefully and adjusting meds. ~  AICD placed 2009 by DrKlein for hx syncope & ischemic cardiomyopathy- followed by DrKlein yearly & doing satis. ~  2DEcho 4/10 showed mild LVH, mod reduced LVF w/ EF=40-45% w/ inferobasal & post HK, mild MR, paradoxical septal motion. ~  9/10: Cards tried to incr the Coreg to 9.375m24m but pt intol & went back to 6.25Bid. ~  8/11:  f/u by DrHochrein w/ recent syncopal episode & meds adjusted Coreg 3.125Bid & Lisinopril 5Bid. ~  Serial XRays have all revealed signif atherosclerotic changes diffusely (eg- lumbar films 4/12, CT neck 4/12 as well). ~  4/12> ER visit for fall at home w/ signif trauma> ?post BP related, ?med related, ?other etiology > Cards eval & weaned off Coreg/ Lisinopril w/ resolution of postural hypotension. ~  7/12:  DrKlein restarted Lisin 5mgQ62m BP improved, no further postural changes, f/u 2DEcho w/ EF=35-40% & Gr1DD... ~  12/12:  DrHochrein has him back on Coreg3.125Bid & Lisinopril5/d & stable overall... ~  7/13: he saw DrKlein w/ interrogation of his AICD showing some AFib episodes; he is considering anticoagulation but held off after discussion w/ DrHochrein... ~  7-8/14: on ASA, Coreg, Lisin; improved w/ BBlocker/ ACE rx, not requiring diuretic and denies CP/ angina, etc; followed by DrHochrein- Treadmill, Myoview, 2DEcho 7-8/14 reviewed & ?EF via Echo?  AICD w/ hx Twave oversensing 7/14 requiring AICD device adjustment by DrKlein...  HE CONTINUES TO f/u w/ DrKlein & DrHochrein YEARLY... ~  MYOVIEW 7/14 showed large posterolat wall infarct w/o ischemia, EF=26%, diffuse HK  in posterolat wall... ~  2DEcho 8/14 showed mild LVH, focal basal hypertrophy, norm LVF w/ EF=60-65%, norm wall motion, mild LA&RA dil, PAsys=36...  ~  ADDENDUM>> DrHochrein reviewed his 52 2DEcho & Myoview>> he feels that EF is ~45%...  CEREBROVASCULAR DISEASE PERIPHERAL VASCULAR DISEASE (ICD-443.9) - on ASA 54m/d...  ~  [facility] he is sedentary and hasn't had ABI's checked... I will leave this to DLaguna Niguel.. ~  CDopplers 7/10, 1/1,1 & 8/11 showed stable mod carotid dis bilat w/ heavy calcif plaque & 60-79% bilat ICA stenoses... ~  CDopplers 2/12 w/ worsening RICA velocities c/w 80-99% stenosis (stable 679-89%LICA stenosis);  He was evaluated by [facility] DrDickson & underwent a right CAE w/ DPA 32/11without complications;  He has been doing satis post-op & they plan f/u CDoppler in 671montervals going forward... ~  NOTE:  CT Brain 4/12 in ER showed mild to mod cortical vol loss & cbll atrophy, sm vessel dis, no acute changes... Prom vasc calcif seen on neck films & in abd... ~  CDopplers 4/13 by DrDickson showed patent right CAE site w/ mild plaque; 40-59% left ICA stenosis felt to be stable... ~  CDopplers 4/14 showed patent right CAE site w/ smooth plaque, and <40% left ICA stenosis but velocities are underest due to calcif plaque; felt to be stable... ~  8/15:  f/u CDoppler by [facility] 8/15 showed patent rightCAE site & <40% left ICA stenosis w/ calcif plaque; they plan yearly follow up.  HYPERCHOLESTEROLEMIA (ICD-272.0) - on SIMVASTATIN 4019m & FENOFIBRATE 160/d. ~  FLP 4/08 showed TChol 131, TG 100, HDL 41, LDL 70 ~  FLP 2/09 showed TChol 124, TG 132, HDL 37, LDL 61 ~  FLP 12/10 > pt never ret for FLP & [facility]  [place]/11 showed TChol 112, TG 51, HDL 43, LDL 59 ~  4/12:  They report some difficult swallowing the Tricor capsule==> referred to GI for swallowing eval & switched to FENOFIBRATE 160m17m ~  FLP 12/12 on Simva40+Feno160 showed TChol 124, TG 78, HDL 37, LDL 72 ~  FLP 8/14 on Simva40+Feno160 showed TChol 122, TG 88, HDL 40, LDL 65; continue same...  ~  FLP [place]5 on Simva40+Feno160 showed   GERD/ DYSPHAGIA> ~  4/12: pt noted some reflux symptoms & mild dysphagia for large capsule; Protonix 40mg31mtarted & refer to GI for eval; they also note weak voice & may  need ENT eval if not resolved in follow up... ~  5/12:  Pt states all symptoms resolved on their own, didn't take PPI, didn't see GI, denies swallowing or voice issues now.  DIVERTICULOSIS OF COLON (ICD-562.10) > he takes fiber supplement daily. COLONIC POLYPS (ICD-211.3) HEMORRHOIDS (ICD-455.6) - last colonoscopy was 11/06 by DrPatterson showing divertics, several 1-3mm p43mps (hyperplastic), and hems...  Hx of LIVER FUNCTION TESTS, ABNORMAL (ICD-794.8) - improved off etoh... ~  labs 2/09 showed SGOT= 30, SGPT= 17 ~  labs 12/11 showed SGOT= 38, SGPT= 19 ~  Labs 12/12 off all etoh & LFTs all wnl... ~  LFTs have remained wnl...  DEGENERATIVE JOINT DISEASE (ICD-715.90) Hx of GOUT (ICD-274.9) - on ALLOPURINOL 100mg/d48m~  Labs 2/09 showed Uric= 3.3 ~  Labs 8/15 on allopurinol 100mg/d 4med Uric = 3.9  OSTEOPOROSIS (ICD-733.00) - supposed to be on Caltrate, MVI, Vit D...  ~  he had T12 compression after syncopal spell 2009 w/ vertebroplasty by DrDeveshwar...   ~  BMD here 10/09  showed TScores +0.3 in Spine, & -2.7 in right FemNeck (Ortho eval by Vidal Schwalbe)... ~  labs 12/11 showed Vit D level = 22... rec to take Men's MVI + Vit D 2000 u daily. ~  Labs 12/12 showed Vit D level = 17; REC> start regular dosing of Calcium, Men's MVI, Vit D 5000u daily... ~  6/13: he never got the OTC Vit D so we will Rx w/ VitD 50K weekly Rx now... ~  8/15: on calcium, MVI, VitD 2000u daily w/ labs showing VitD level = 59 ~  CXR 8/15 showed new part compression T10=> will sched f/u BMD ~  BMD 06/15/14 showed lowest Tscore -3.2 in right FemNeck (spine was +0.1); discussed w/ pt need for bone building Rx- start ALENDRONATE 23m/wk... Called to GMirant  DERMATITIS (ICD-692.9) - he has eosinophilia & saw Derm w/ rx for topical cream;  rec to take antihist as well...  Health Maintenance -  ~  GI: colonoscopy 11/06 w/ several hyperplastic polyps removed... ~  GU: PSA 12/12 = 1.50 ~  Immunizations:  he  tells me that he had PNEUMOVAX & 2010 Flu shot 10/10... received TETANUS shot here 2003...  Objective:   Physical Exam      WD, WN, 81y/o WM > chr ill appearing with raspy voice and upper airway cough pattern   Wt Readings from Last 3 Encounters:  04/27/15 185 lb (83.915 kg)  02/24/15 193 lb 9.6 oz (87.816 kg)  08/17/14 189 lb (85.73 kg)    Vital signs reviewed    GENERAL:  Alert & oriented; pleasant & cooperative... HEENT:  [place]/AT, EOM-full, PERRLA, EACs-clear  NOSE-clear, THROAT-clear & wnl, Voice sounds back to norm NECK:  Supple w/ fairROM; no JVD; prominent carotid impulses, scar on right, + bruits; no thyromegaly or nodules palpated; no lymphadenopathy... CHEST:  bilat insp  rhonchi at bases & end-exp wheezing,  no rales, no signs of consolidation... HEART:  Regular Rhythm; gr 1/6 SEM, S4, no rubs... ABDOMEN:  Min Bruise in right flank, mild tender lower ribs; normal bowel sounds; no organomegaly or masses detected. EXT: without deformities, mild arthritic changes; no varicose veins/ +venous insuffic/ no edema. NEURO: no focal neuro deficits... DERM:  dry skin dermatitis, seborrhea, rosacea...     I personally reviewed images and agree with radiology impression as follows:  CXR:  04/27/2015  1. Left lower lobe opacity most consistent with a small patchy area of pneumonia. 2. Probable small left pleural effusion      Assessment & Plan:    GOLD stage3 COPD>  he is congratulated on quitting smoking completely; Advised to stay on the Advair Bid regularly, he does not want additional meds...    Hx HBP & Hx Postural Hypotension>  BP improved & tolerating Coreg 3.125Bid + Lisinopril 576mQhs; no postural BP changes noted today & his dizziness has resolved; he notes all symptoms improved off etoh...  ASHD/ Cardiomyop/ AICD>  Followed by DHMiltonearly;  Continue current meds & follow up...  Periph Vasc Dis>  Followed by [facility], DrDickson & CDoppler 8/15 showed patent  right CAE site w/ <40% left ICA stenosis & they are following...  CHOL>  FLP looks good on Simva40 & Feno160, continue same...  GI/ Dysphagia>  He notes symptoms resolved spont & he does not believe that he has a problem in this area, declines PPI Rx or GI eval...  LBP w/ compression fx T12 in 2009 w/ vertebroplasty>>  FALL 4/12 w/ signif trauma &  right rib fxs>   OSTEOPOROSIS>  On calcium, MVI, VitD 2000u/d;  Needs f/u BMD & consideration of med rx=> BMD -3.2 in R FemNeck, start Alendronate70/wk... New part compression T10 found 8/15 on routine CXR>   Other medical issues as noted.Marland KitchenMarland Kitchen

## 2015-04-27 NOTE — Patient Instructions (Signed)
Stop advair Start dulera 200 Take 2 puffs first thing in am and then another 2 puffs about 12 hours later.    Work on inhaler technique:  relax and gently blow all the way out then take a nice smooth deep breath back in, triggering the inhaler at same time you start breathing in.  Hold for up to 5 seconds if you can.  Rinse and gargle with water when done    Zpak  Prednisone 10 mg take  4 each am x 2 days,   2 each am x 2 days,  1 each am x 2 days and stop   Please remember to go to the lab and x-ray department downstairs for your tests - we will call you with the results when they are available.  Please schedule a follow up office visit in 2 weeks, sooner if needed

## 2015-04-28 ENCOUNTER — Encounter: Payer: Self-pay | Admitting: Internal Medicine

## 2015-04-28 DIAGNOSIS — J189 Pneumonia, unspecified organism: Secondary | ICD-10-CM | POA: Insufficient documentation

## 2015-04-28 MED ORDER — MOMETASONE FURO-FORMOTEROL FUM 200-5 MCG/ACT IN AERO: INHALATION_SPRAY | RESPIRATORY_TRACT | Status: DC

## 2015-04-28 NOTE — Progress Notes (Signed)
Quick Note:  Spoke with pt and notified of results per Dr. Wert. Pt verbalized understanding and denied any questions.  ______ 

## 2015-04-28 NOTE — Assessment & Plan Note (Signed)
Quit smoking 2014 Spirometry   FEV1  1.56 (47%) ratio 67  - 04/27/2015 p extensive coaching HFA effectiveness =    75% > try dulera 200 2bid   Symptoms chronically difficult to control . DDX of  difficult airways management all start with A and  include Adherence, Ace Inhibitors, Acid Reflux, Active Sinus Disease, Alpha 1 Antitripsin deficiency, Anxiety masquerading as Airways dz,  ABPA,  allergy(esp in young), Aspiration (esp in elderly), Adverse effects of meds,  Active smokers, A bunch of PE's (a small clot burden can't cause this syndrome unless there is already severe underlying pulm or vascular dz with poor reserve) plus two Bs  = Bronchiectasis and Beta blocker use..and one C= CHF  Adherence is always the initial "prime suspect" and is a multilayered concern that requires a "trust but verify" approach in every patient - starting with knowing how to use medications, especially inhalers, correctly, keeping up with refills and understanding the fundamental difference between maintenance and prns vs those medications only taken for a very short course and then stopped and not refilled.  The proper method of use, as well as anticipated side effects, of a metered-dose inhaler are discussed and demonstrated to the patient. Improved effectiveness after extensive coaching during this visit to a level of approximately  75% > try dulera 200 2bid sample  ? ACEi case > see if raspy upper airway symptoms resolve off advair , if not rec change to arb trial basis  ? Adverse effects of dpi > try off advair  ? BB effects > usually not seen with such low doses of coreg but Strongly prefer in this setting: Bystolic, the most beta -1  selective Beta blocker available in sample form, with bisoprolol the most selective generic choice  on the market.   I had an extended discussion with the patient reviewing all relevant studies completed to date and  lasting 15 to 20 minutes of a 25 minute visit    Each maintenance  medication was reviewed in detail including most importantly the difference between maintenance and prns and under what circumstances the prns are to be triggered using an action plan format that is not reflected in the computer generated alphabetically organized AVS.    Please see instructions for details which were reviewed in writing and the patient given a copy highlighting the part that I personally wrote and discussed at today's ov.

## 2015-04-28 NOTE — Assessment & Plan Note (Signed)
zpack rx 04/27/15 > f/u cxr one week   Very small area/ not toxic or freq abx so zpak reasonable first choice > change to levquin if not improving

## 2015-04-29 ENCOUNTER — Encounter: Payer: Self-pay | Admitting: Cardiology

## 2015-05-04 ENCOUNTER — Ambulatory Visit: Payer: Medicare Other | Admitting: Pulmonary Disease

## 2015-05-05 ENCOUNTER — Ambulatory Visit: Payer: Medicare Other | Admitting: Adult Health

## 2015-05-05 ENCOUNTER — Encounter: Payer: Self-pay | Admitting: Internal Medicine

## 2015-05-06 ENCOUNTER — Ambulatory Visit (INDEPENDENT_AMBULATORY_CARE_PROVIDER_SITE_OTHER)
Admission: RE | Admit: 2015-05-06 | Discharge: 2015-05-06 | Disposition: A | Discharge status: Home / Self Care | Payer: Medicare Other | Source: Ambulatory Visit | Attending: Pulmonary Disease | Admitting: Pulmonary Disease

## 2015-05-06 ENCOUNTER — Ambulatory Visit (INDEPENDENT_AMBULATORY_CARE_PROVIDER_SITE_OTHER): Payer: Medicare Other | Admitting: Pulmonary Disease

## 2015-05-06 ENCOUNTER — Other Ambulatory Visit: Payer: Self-pay | Admitting: Pulmonary Disease

## 2015-05-06 ENCOUNTER — Encounter: Payer: Self-pay | Admitting: Pulmonary Disease

## 2015-05-06 ENCOUNTER — Ambulatory Visit: Payer: Medicare Other | Admitting: Pulmonary Disease

## 2015-05-06 VITALS — BP 110/60 | HR 64 | Temp 97.5°F | Wt 182.4 lb

## 2015-05-06 DIAGNOSIS — T148 Other injury of unspecified body region: Secondary | ICD-10-CM

## 2015-05-06 DIAGNOSIS — R0600 Dyspnea, unspecified: Secondary | ICD-10-CM

## 2015-05-06 DIAGNOSIS — J449 Chronic obstructive pulmonary disease, unspecified: Secondary | ICD-10-CM | POA: Diagnosis not present

## 2015-05-06 DIAGNOSIS — T148XXA Other injury of unspecified body region, initial encounter: Secondary | ICD-10-CM

## 2015-05-06 DIAGNOSIS — I2510 Atherosclerotic heart disease of native coronary artery without angina pectoris: Secondary | ICD-10-CM | POA: Diagnosis not present

## 2015-05-06 DIAGNOSIS — J984 Other disorders of lung: Secondary | ICD-10-CM | POA: Diagnosis not present

## 2015-05-06 DIAGNOSIS — M159 Polyosteoarthritis, unspecified: Secondary | ICD-10-CM

## 2015-05-06 DIAGNOSIS — I255 Ischemic cardiomyopathy: Secondary | ICD-10-CM

## 2015-05-06 DIAGNOSIS — I10 Essential (primary) hypertension: Secondary | ICD-10-CM | POA: Diagnosis not present

## 2015-05-06 DIAGNOSIS — M810 Age-related osteoporosis without current pathological fracture: Secondary | ICD-10-CM

## 2015-05-06 DIAGNOSIS — E7800 Pure hypercholesterolemia, unspecified: Secondary | ICD-10-CM

## 2015-05-06 DIAGNOSIS — M545 Low back pain, unspecified: Secondary | ICD-10-CM

## 2015-05-06 DIAGNOSIS — J189 Pneumonia, unspecified organism: Secondary | ICD-10-CM | POA: Diagnosis not present

## 2015-05-06 DIAGNOSIS — I779 Disorder of arteries and arterioles, unspecified: Secondary | ICD-10-CM

## 2015-05-06 DIAGNOSIS — M150 Primary generalized (osteo)arthritis: Secondary | ICD-10-CM

## 2015-05-06 DIAGNOSIS — I739 Peripheral vascular disease, unspecified: Secondary | ICD-10-CM

## 2015-05-06 NOTE — Patient Instructions (Signed)
Today we updated your med list in our EPIC system...    Continue your current medications the same...    Continue the [facility] or the Advair & let me know which you prefer...  Continue the [facility] taking ~600mg  4 times daily w/ fluids...  Please return to our lab one morning next week for your follow up FASTING blood work...    We will contact you w/ the results when available...   Stay as active as possible!!!  Call for any questions...   Let's plan a follow up visit in 39mo, sooner if needed for problems.Marland KitchenMarland Kitchen

## 2015-05-07 ENCOUNTER — Encounter: Payer: Self-pay | Admitting: Pulmonary Disease

## 2015-05-07 NOTE — Progress Notes (Addendum)
Subjective:    Patient ID: [name], male    DOB: 23-Jun-1931, 81 y.o.   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 he has been followed closely by DrHochrein & DrKlein during the past years due to his cardiomyopathy & AICD...  ~  SEE PREV EPIC NOTES FOR THE EARLIER DATA >>  ~  June 08, 2013:  21moROV & [name] ret [place] inappropriate shocks from his AICD- checked by Cards (followed by HTylene Fantasia 7/14=> Twave oversensing & devise adjusted;  He had Treadmill test 7/14- no ischemic changes & no T wave oversensing but BP dropped therefore he had Myoview as well 7/14 intermed risk w/ large posterolat infarct, no ischemia, EF=26% (worse) w/ diffuse HK of posterolat wall;  He's had some weight gain he attrib to smoking cessation, BNP=156;  He had f/u 2DEcho & curiously this revealed mild LVH, focal basal hypertrophy, norm LVF w/ EF=60-65% & no regional wall motion abn, mild LA&RA dil => he has f/u DrHochrein soon... We reviewed the following medical problems during today's office visit >>     COPD> on Advair250Bid & Proair for prn use; doing better w/ smoking cessation...    HBP> on Coreg3.125Bid & Lisinopril5; BP= 102/60 & denies CP, palpit, dizzy/syncope, ch in SOB/DOE, edema...    ASHD/ Cardiomyopathy> on ASA 82md; improved w/ BBlocker/ ACE rx, not requiring diuretic and denies CP/ angina, etc; followed by DrHochrein; Treadmill, Myoview, 2DEcho 7-8/14 reviewed & ?EF via Echo?    AICD w/ hx Twave oversensing 7/14 requiring AICD device adjustment by DrKlein...    Periph Vasc Dis> AAA repair 2000 by DrSheryn Bisonstill too sedentary & he declined cardiac rehab; s/p R CAE w/ DPA 3/12 by DrCDickson; f/u CDoppler by [facility] 4/14 showed patent rightCAE site & <40% left ICA stenosis w/ calcif plaque; they plan regular follow up...    CHOL> on Simva40 + Fenofib160; FLP 8/14 shows TChol 122, TG 88, HDL 40, LDL 65; continue same...     GI> GERD, Divertics, Polyps,etc>  GI reported stable on incr Fiber intake; his CC= gas pains & we rec Mylicon, Phazyme etc...    DJD, osteoporosis, compression fx>  Vit D level is low at 19 & rec to take Calcium, Men's formula MVI, & he never tool the 50K Rx; Rec- OTC 2000u daily...    DERM> he saw Derm re: his rash (?seb dermatitis) and itching; labs show 22% eos & rec to take Antihist eg BeSharen HeckZyrkek daily... We reviewed prob list, meds, xrays and labs> see below for updates >>   LABS 8/14:  FLP- at goals on Simva40+Feno160;  Chems- wnl;  CBC- wnl x Plat=112;  TSH=2.42;  VitD=19;  PSA=1.66...  ~  May 31, 2014:  [place], he missed 19mo103mo due to the weather in Feb & was doing well so he put it off til now;  No new complaints or concerns- he notes that his breathing is good, denies any signif cough, sput, SOB, etc; he's been active doing all the work around the house esp since wife is very limited in activity due to her COPD; he further denies CP, palpit, edema; his only complaint on ROS is some back pain after strenuous exercise & it resolves w/ a little rest... We reviewed the following medical problems during today's office visit >>     COPD> on  Advair250Bid & Proair for prn use; doing better w/ complete smoking cessation; PFTs show GOLD Stage3 COPD w/ FEV1=1.56 (47%); he is relatively asymptomatic & doesn't want to add additional meds.     HBP> on Coreg3.125Bid & Lisinopril5; BP= 128/64 & denies CP, palpit, dizzy/syncope, ch in SOB/DOE, edema...    ASHD/ Cardiomyopathy> on ASA 68m/d; improved w/ BBlocker/ ACE rx, not requiring diuretic and denies CP/ angina, etc; followed by DrHochrein- Treadmill, Myoview, 2DEcho 7-8/14 reviewed & EF=26% by Myoview & 60% by 2DEcho...     AICD w/ hx Twave oversensing 7/14 requiring AICD device adjustment by DrKlein...    Periph Vasc Dis> AAA repair 2000 by DSheryn Bison still too sedentary & he declined cardiac rehab; s/p R CAE  w/ DPA 3/12 by DrCDickson; f/u CDoppler by [facility] 8/15 showed patent rightCAE site & <40% left ICA stenosis w/ calcif plaque; they plan yearly follow up...    CHOL> on Simva40 + Fenofib160; FLP 8/15 shows TChol 114, TG 139, HDL 33, LDL 53; continue same & incr exercise...    GI> GERD, Divertics, Polyps,etc>  GI reported stable on incr Fiber intake; prev gas pains improved w/ Mylicon, Phazyme etc...    DJD, osteoporosis, compression fx> on Calcium, Men's formula MVI & OTC VitD 2000u daily; Labs 8/15 showed Vit D level = 59; CXR w/ new part T10 compression=> needs f/u BMD & consider med rx...    DERM> he saw Derm re: his ?seb dermatitis and itching- improved on Rx; labs show 20% eos & rec to take Antihist eg BSharen Heck Zyrkek daily... We reviewed prob list, meds, xrays and labs> see below for updates >>   CXR 8/15 showed Cardiomeg & AICD w/o change, clear lungs/ NAD, old right rib fxs & new T10 compression, osteopenia => needs repeat BMD & consideration of meds.  PFT 8/15 showed FVC=2.72 (61%), FEV1=1.56 (47%), %1sec=57, mid-flows=31% predicted; c/w GOLD Stage3 COPD...  LABS 8/15:  FLP- at goals on Simva40+Feno160 x HDL=33;  Chems- ok w/ Cr=1.3;  CBC- ok w/ Hg=13.9 but MCV=105, Plat=96K & Eos=20%;  TSH=2.26;  Uric=3.9 on Allopurinol;  VitD=59 on OTC supplement...  ADDENDUM>> DrHochrein reviewed his 242DEcho & Myoview>> he feels that EF is ~45%... ADDENDUM>> BMD 06/15/14 [facility]/ pt need for bone building Rx- start ALENDRONATE 754mwk... Called to [facility] ~  May 06, 2015:  1182moV & Arnulfo was seen by DrWert 04/27/15 as an add-on for "head-cold" w/ drainage, cough, wheezing, incr SOB=> treated w/ ZPak, Pred taper, [facility] improved; his CXR showed ?patchy LLL pneumonia and he notes less cough/ phlegm, improved SOB/ tightness, no f/c/s, but he is still too sedentary & not exercising;  He states that he doesn't notice any  diff [facility] the [facility] & he says he'd prefer to use up the dulera & then ret to the Advair250Bid going forward;  We reviewed the benefit of staying on his meds regularly and increasing his exercise program... We reviewed the following medical problems during today's office visit >>     COPD> on Advair250Bid (vs Dulera200) & Proair prn; doing better w/ complete smoking cessation; PFTs 8/15 show GOLD Stage3 COPD w/ FEV1=1.56 (47%); he was treated for LLL pneumonia 7/16 w/ Zpak/ Pred & improvedl he remains stoic & resistant to regular meds/ exercise/ etc...     HBP> on Coreg3.125Bid & Lisinopril5; BP= 110/60 & denies CP, palpit, dizzy/syncope, ch in SOB/DOE, edema...Marland KitchenMarland Kitchen  ASHD/ Cardiomyopathy> on ASA 35m/d; improved w/ BBlocker/ ACE rx, not requiring diuretic and denies CP/ angina, etc; followed by DrHochrein=> Treadmill, Myoview, 2DEcho 2014 reviewed & EF=26% by Myoview & 60% by 2DEcho; last seen by DrHochrein 6/16 &7 felt to be stable...    AICD w/ hx Twave oversensing 7/14 requiring AICD device adjustment by DrKlein; last seen 10/15- doing satis, no changes made, he continues telemonitoring monthly...    Periph Vasc Dis> AAA repair 2000 by DSheryn Bison still too sedentary & he declined cardiac rehab; s/p R CAE w/ DPA 3/12 by DrCDickson; f/u CDoppler by [facility] 8/15 showed patent rightCAE site & <40% left ICA stenosis w/ calcif plaque; they plan yearly follow up...    CHOL> on Simva40 + off Fenofib per DrHochrein; FLP 8/15 (on both) showed TChol 114, TG 139, HDL 33, LDL 53; he will ret for Fasting labs...    GI> GERD, Divertics, Polyps,etc>  GI reported stable on incr Fiber intake; prev gas pains improved w/ Mylicon, Phazyme etc...    DJD, osteoporosis, compression fx> on Allopurinol100, Men's formula MVI & VitD supplement; He is off Alendronate70/wk (?only took it for a short time after 8/15 BMD); Labs 8/15 showed Vit D level = 59; CXR 8/15 w/ new part T10 compression=> BMD w/ Tscore +0.1 Spine (but has  arthritis & scoliosis), and -3.2 right FemNeck=> Alendronate70 prescribed but pt didn't stay on this med.    DERM> he saw Derm re: his ?seb dermatitis and itching- improved on Rx; labs showed 20% eos & rec to take Antihist eg BSharen Heck Zyrkek daily... EXAM reveals Afeb, VSS, O2sat=96% on RA;  HEENT- sl red, mallampati2;  Chest- mild end-exp rhonchi, no w/r/consolidation;  Heart- RR, gr1/6 SEM w/o r/g;  Abd- soft, neg;  Ext- w/o c/c/e...  CXR 04/27/15 showed mild cardiomeg, AICD w/o change, left basilar opac & ?sm effusion, osteopenia, mild compression fx w/o change...  CXR 05/06/15 showed borderline heart size, AICD, atherosclerotic calcif in arch; improved LLL opac w/ mild scarring left base, chronic lung dis w/ apic pleural scarring, old right rib fxs, etc...  LABS 7/16:  FLP- at goals on diet + Simva40;  Chems- wnl;  CBC- wnl (MCV=103);  BNP=148;  VitD=22 & rec to take OTC VitD supplement ~2000u daily 7 stay on this!          Problem List:  COPD ([id number] - despite all efforts JCarliecontinues to smoke 3-4 cigars per day... he has min cough, some phlegm, but denies CP, SOB, wheezing, etc...  He doesn't want Chantix or help w/ smoking cessation;  He has a PROAIR inhaler for Prn use but he seldom uses it, & he knows to use the OTC MUCINEX 1-2 Bid w/ fluids for congestion. ~  CXR 3/12 in hosp for CAE showed COPD/E, biapical pleuroparenchymal scarring, NAD, AICD on left, old left rib fx, old T12 vertebroplasty... ~  Fall at home 4/12 w/ signif trauma & prob right rib fxs (CXR in ER showed some atelec but NAD & no rib films done). ~  1/14: presents w/ URI & acute on chr COPD exac; CXR in ER showed norm heart size, clear lungs, AICD in place, compression T12 w/ augmentation. ~  2/14: he responded nicely to Levaquin, Pred, Advair250, Mucinex, etc; asked to STAY on the [id number] Bid; he reports quitting the cigars! ~  8/14: on Advair250Bid & Proair for prn use; doing better w/ smoking  cessation... ~  8/15:   on Advair250Bid & Proair for prn use;  doing better w/ complete smoking cessation; PFTs show GOLD Stage3 COPD w/ FEV1=1.56 (47%); he is relatively asymptomatic & doesn't want to add additional meds. ~  CXR 8/15 showed Cardiomeg & AICD w/o change, clear lungs/ NAD, old right rib fxs & new T10 compression, osteopenia => needs repeat BMD & consideration of meds. ~  PFT 8/15 showed FVC=2.72 (61%), FEV1=1.56 (47%), %1sec=57, mid-flows=31% predicted; c/w GOLD Stage3 COPD... ~  04/2015> presented w/ URI, bronchitis exac & LLL pneumonia;  treated w/ ZPak, Pred taper, ch Advair250 to Dulera200=> improved...   HYPERTENSION (ICD-401.9) - back on COREG 3.125Bid & LISINOPRIL $RemoveBefo'5mg'TzhbKNyLkAJ$ Qhs... Prev had to wean off these due to dizziness & post hypotension (resolved off Etoh). ~  12/11:  BP= 126/70 and doing well> denies HA, fatigue, visual changes, CP, palipit, dizziness, dyspnea, edema, etc; he has had postural hypotension & several syncopal episodes related to this- now improved w/ the final adjustment in his meds. ~  4/12:  BP= 90/58 ==>100/60 recheck & he is weak; asked to monitor BP at home & may need to decr meds. ~  5/12:  BP= 130/70 supine & 100/60 sitting & standing (no symptoms at present)> he has been off the Lisinopril & Coreg for 1wk now. ~  7/12:  BP= 120/72 & 140/70 w/o postural changes (improved off etoh); he is back on Lisinopril $RemoveBefor'5mg'CpFMhwYMpCfe$  Qhs per Cards. ~  12/12:  BP= 128/74 on Coreg3.125Bid & Lisinopril5; denies CP, palpit, dizzy/syncope, ch in SOB/DOE, edema... ~  6/13:  BP= 122/68 & he remains largely asymptomatic... ~  1/14:  BP= 120/58 & he is here w/ acute on chr COPD exac; denies CP, palpit, etc... ~  8/14: on Coreg3.125Bid & Lisinopril5; BP= 102/60 & denies CP, palpit, dizzy/syncope, ch in SOB/DOE, edema... ~  8/15: on Coreg3.125Bid & Lisinopril5; BP= 128/64 & denies CP, palpit, dizzy/syncope, ch in SOB/DOE, edema... ~  7/16: on Coreg3.125Bid & Lisinopril5; BP= 110/60 & he  remains asymptomatic...  ATHEROSCLEROTIC HEART DISEASE (ICD-414.00) - on ASA $Remo'81mg'xieGj$ /d + above meds... ISCHEMIC CARDIOMYOPATHY (ICD-414.8) Hx of SYNCOPE (ICD-780.2) & IMPLANTABLE DEFIBRILLATOR, DDD MDT (ICD-V45.02) ~  Cath in 2003 w/ 2 vessel CAD and stent placed in LAD...  ~  Cardiolite 1/08 w/ large inferolat infarct, no ischemia, EF=36%...  ~  2DEcho 2/08 w/ infer & post HK, EF=40%...  ~  recath 6/09 w/ heavily calcif vessels & 40% EF- DrHochrein has been following carefully and adjusting meds. ~  AICD placed 2009 by DrKlein for hx syncope & ischemic cardiomyopathy- followed by DrKlein yearly & doing satis. ~  2DEcho 4/10 showed mild LVH, mod reduced LVF w/ EF=40-45% w/ inferobasal & post HK, mild MR, paradoxical septal motion. ~  9/10: Cards tried to incr the Coreg to 9.$RemoveBe'375mg'gmNWTgxwr$ Bid but pt intol & went back to 6.25Bid. ~  8/11:  f/u by DrHochrein w/ recent syncopal episode & meds adjusted Coreg 3.125Bid & Lisinopril 5Bid. ~  Serial XRays have all revealed signif atherosclerotic changes diffusely (eg- lumbar films 4/12, CT neck 4/12 as well). ~  4/12> ER visit for fall at home w/ signif trauma> ?post BP related, ?med related, ?other etiology > Cards eval & weaned off Coreg/ Lisinopril w/ resolution of postural hypotension. ~  7/12:  DrKlein restarted Lisin $RemoveBeforeD'5mg'OiIqjLDiUdjunx$ Qhs, BP improved, no further postural changes, f/u 2DEcho w/ EF=35-40% & Gr1DD... ~  12/12:  DrHochrein has him back on Coreg3.125Bid & Lisinopril5/d & stable overall... ~  7/13: he saw DrKlein w/ interrogation of his AICD showing some AFib episodes; he is  considering anticoagulation but held off after discussion w/ DrHochrein... ~  7-8/14: on ASA, Coreg, Lisin; improved w/ BBlocker/ ACE rx, not requiring diuretic and denies CP/ angina, etc; followed by DrHochrein- Treadmill, Myoview, 2DEcho 7-8/14 reviewed & ?EF via Echo?  AICD w/ hx Twave oversensing 7/14 requiring AICD device adjustment by DrKlein...  HE CONTINUES TO f/u w/ DrKlein & DrHochrein  YEARLY... ~  MYOVIEW 7/14 showed large posterolat wall infarct w/o ischemia, EF=26%, diffuse HK in posterolat wall... ~  2DEcho 8/14 showed mild LVH, focal basal hypertrophy, norm LVF w/ EF=60-65%, norm wall motion, mild LA&RA dil, PAsys=36...  ~  ADDENDUM>> DrHochrein reviewed his 29 2DEcho & Myoview>> he feels that EF is ~45%... ~  He had f/u DrKlein 10/15> doing satis, no changes made, he continues telemonitoring monthly... ~  He had f/u DrHochrein 6/16> denies symptoms, doing low-level bike exercise, exam unchanged, felt to be stable...   CEREBROVASCULAR DISEASE PERIPHERAL VASCULAR DISEASE (ICD-443.9) - on ASA 18m/d...  ~  [facility] he is [facility] and hasn't had ABI's checked... I will leave this to DSealy.. ~  CDopplers 7/10, 1/1,1 & 8/11 showed stable mod carotid dis bilat w/ heavy calcif plaque & 60-79% bilat ICA stenoses... ~  CDopplers 2/12 w/ worsening RICA velocities c/w 80-99% stenosis (stable 600-86%LICA stenosis);  He was evaluated by [facility] DrDickson & underwent a right CAE w/ DPA 37/61without complications;  He has been doing satis post-op & they plan f/u CDoppler in 695montervals going forward... ~  NOTE:  CT Brain 4/12 in ER showed mild to mod cortical vol loss & cbll atrophy, sm vessel dis, no acute changes... Prom vasc calcif seen on neck films & in abd... ~  CDopplers 4/13 by DrDickson showed patent right CAE site w/ mild plaque; 40-59% left ICA stenosis felt to be stable... ~  CDopplers 4/14 showed patent right CAE site w/ smooth plaque, and <40% left ICA stenosis but velocities are underest due to calcif plaque; felt to be stable... ~  8/15:  f/u CDoppler by [facility] 8/15 showed patent rightCAE site & <40% left ICA stenosis w/ calcif plaque; they plan yearly follow up.  HYPERCHOLESTEROLEMIA (ICD-272.0) - on SIMVASTATIN 4072m & FENOFIBRATE 160/d. ~  FLP 4/08 showed TChol 131, TG 100, HDL 41, LDL 70 ~  FLP 2/09 showed TChol 124, TG 132, HDL 37,  LDL 61 ~  FLP 12/10 > pt never ret for FLP & insurance [facility]  [place] showed TChol 112, TG 51, HDL 43, LDL 59 ~  4/12:  They report some difficult swallowing the Tricor capsule==> referred to GI for swallowing eval & switched to FENOFIBRATE 160m43m ~  FLP 12/12 on Simva40+Feno160 showed TChol 124, TG 78, HDL 37, LDL 72 ~  FLP 8/14 on Simva40+Feno160 showed TChol 122, TG 88, HDL 40, LDL 65; continue same...  ~  FLP [place] on Simva40+Feno160 showed TChol 114, TG 139, HDL 33, LDL 53 ~  DrHochrein stopped his Fenofibrate, now on Simva40 + diet...  GERD/ DYSPHAGIA> ~  4/12: pt noted some reflux symptoms & mild dysphagia for large capsule; Protonix 40mg38mtarted & refer to GI for eval; they also note weak voice & may need ENT eval if not resolved in follow up... ~  5/12:  Pt states all symptoms resolved on their own, didn't take PPI, didn't see GI, denies swallowing or voice issues now.  DIVERTICULOSIS OF COLON (ICD-562.10) > he takes fiber supplement daily.  COLONIC POLYPS (ICD-211.3) HEMORRHOIDS (ICD-455.6) - last colonoscopy was 11/06 by DrPatterson showing divertics, several 1-106mm polyps (hyperplastic), and hems...  Hx of LIVER FUNCTION TESTS, ABNORMAL (ICD-794.8) - improved off etoh... ~  labs 2/09 showed SGOT= 30, SGPT= 17 ~  labs 12/11 showed SGOT= 38, SGPT= 19 ~  Labs 12/12 off all etoh & LFTs all wnl... ~  LFTs have remained wnl...  DEGENERATIVE JOINT DISEASE (ICD-715.90) Hx of GOUT (ICD-274.9) - on ALLOPURINOL $RemoveBefore'100mg'VQTlJELKBerVK$ /d... ~  Labs 2/09 showed Uric= 3.3 ~  Labs 8/15 on allopurinol $RemoveBefore'100mg'VqNtdqMmFyFDw$ /d showed Uric = 3.9  OSTEOPOROSIS (ICD-733.00) - supposed to be on Caltrate, MVI, Vit D...  ~  he had T12 compression after syncopal spell 2009 w/ vertebroplasty by DrDeveshwar...   ~  BMD here 10/09 showed TScores +0.3 in Spine, & -2.7 in right FemNeck (Ortho eval by Vidal Schwalbe)... ~  labs 12/11 showed Vit D level = 22... rec to take Men's MVI + Vit D 2000 u daily. ~  Labs  12/12 showed Vit D level = 17; REC> start regular dosing of Calcium, Men's MVI, Vit D 5000u daily... ~  6/13: he never got the OTC Vit D so we will Rx w/ VitD 50K weekly Rx now... ~  8/15: on calcium, MVI, VitD 2000u daily w/ labs showing VitD level = 59 ~  CXR 8/15 showed new part compression T10=> will sched f/u BMD ~  BMD 06/15/14 showed lowest Tscore -3.2 in right FemNeck (spine was +0.1); discussed w/ pt need for bone building Rx- start ALENDRONATE $RemoveBeforeDEI'70mg'bgMtRJccSkZvXyCT$ /wk... Called to [employer]. ~  Pt stopped the Alendronate on his own => encouraged to use calcium, MVI, VitD, and be careful to avoid falls, etc...  DERMATITIS (ICD-692.9) - he has eosinophilia & saw Derm w/ rx for topical cream;  rec to take antihist as well...  Health Maintenance -  ~  GI: colonoscopy 11/06 w/ several hyperplastic polyps removed... ~  GU: PSA 12/12 = 1.50 ~  Immunizations:  he tells me that he had PNEUMOVAX & 2010 Flu shot 10/10... received TETANUS shot here 2003...   Past Surgical History  Procedure Laterality Date  . Abdominal aortic aneurysm repair  2002    by Dr. Kellie Simmering  . Cad stent  02/2002    Dr. Percival Spanish  . Aicd placed  03/2008    Dr. Caryl Comes  . Right corotid enderectomy  12/2010    Dr. Scot Dock  . Carotid endarterectomy Right January 02, 2011  . Cardiac defibrillator placement  2010    Outpatient Encounter Prescriptions as of 05/06/2015  Medication Sig  . allopurinol (ZYLOPRIM) 100 MG tablet TAKE 1 TABLET ONCE DAILY.  Marland Kitchen aspirin 81 MG tablet Take 81 mg by mouth every morning.   . carvedilol (COREG) 3.125 MG tablet TAKE 1 TABLET TWICE DAILY WITH A MEAL.  Marland Kitchen dextromethorphan-guaiFENesin (MUCINEX DM) 30-600 MG per 12 hr tablet Take 1 tablet by mouth 2 (two) times daily as needed for cough.  Marland Kitchen lisinopril (PRINIVIL,ZESTRIL) 5 MG tablet TAKE 1 TABLET ONCE DAILY.  . mometasone-formoterol (DULERA) 200-5 MCG/ACT AERO Take 2 puffs first thing in am and then another 2 puffs about 12 hours later.  . Multiple Vitamin  (MULTIVITAMIN) capsule Take 1 capsule by mouth daily.  . psyllium (METAMUCIL) 58.6 % powder Take by mouth daily.  . simvastatin (ZOCOR) 40 MG tablet TAKE ONE TABLET AT BEDTIME.  Marland Kitchen VITAMIN D, CHOLECALCIFEROL, PO Take 2 capsules by mouth daily.  Marland Kitchen azithromycin (ZITHROMAX) 250 MG tablet Take 2 on day one  then 1 daily x 4 days (Patient not taking: Reported on 05/06/2015)  . predniSONE (DELTASONE) 10 MG tablet Take  4 each am x 2 days,   2 each am x 2 days,  1 each am x 2 days and stop (Patient not taking: Reported on 05/06/2015)   No facility-administered encounter medications on file as of 05/06/2015.    No Known Allergies   Current Medications, Allergies, Past Medical History, Past Surgical History, Family History, and Social History were reviewed in [employer] record.    Review of Systems         See HPI - all other systems neg except as noted... The patient complains of dyspnea on exertion, prolonged cough, and difficulty walking.  The patient denies anorexia, fever, weight loss, weight gain, vision loss, decreased hearing, hoarseness, chest pain, syncope, peripheral edema, headaches, hemoptysis, abdominal pain, melena, hematochezia, severe indigestion/heartburn, hematuria, incontinence, muscle weakness, suspicious skin lesions, transient blindness, depression, unusual weight change, abnormal bleeding, enlarged lymph nodes, and angioedema.     Objective:   Physical Exam      WD, WN, 81 y/o WM > chr ill appearing but NAD... GENERAL:  Alert & oriented; pleasant & cooperative... HEENT:  [place]/AT, EOM-full, PERRLA, EACs-clear  NOSE-clear, THROAT-clear & wnl, Voice sounds back to norm NECK:  Supple w/ fairROM; no JVD; prominent carotid impulses, scar on right, + bruits; no thyromegaly or nodules palpated; no lymphadenopathy... CHEST:  bilat rhonchi at bases & min end-exp wheezing,  no rales, no signs of consolidation... HEART:  Regular Rhythm; gr 1/6 SEM, S4, no  rubs... ABDOMEN:  Soft, non-tender, normal bowel sounds; no organomegaly or masses detected. EXT: without deformities, mild arthritic changes; no varicose veins/ +venous insuffic/ no edema. NEURO: no focal neuro deficits... DERM:  dry skin dermatitis, seborrhea, rosacea...  RADIOLOGY DATA:  Reviewed in the EPIC EMR & discussed w/ the patient...  LABORATORY DATA:  Reviewed in the EPIC EMR & discussed w/ the patient...   Assessment & Plan:    GOLD stage3 COPD>  he is congratulated on quitting smoking completely & remaining off cigs; Advised to stay on the ICS/LABA (Advair Bid) regularly, he does not want additional meds...    Hx HBP & Hx Postural Hypotension>  BP improved & tolerating Coreg 3.125Bid + Lisinopril 39m Qhs; no postural BP changes noted today & his dizziness has resolved; he notes all symptoms improved off etoh...  ASHD/ Cardiomyop/ AICD>  [place];  Continue current meds & follow up...  Periph Vasc Dis>  Followed by [facility], DrDickson & CDoppler 8/15 showed patent right CAE site w/ <40% left ICA stenosis & they are following...  CHOL>  FLP looked good on Simva40 & Feno160, the latter was stopped by DrHochrein; f/u FLP on Simva40=> pending  GI/ Dysphagia>  He notes symptoms resolved spont & he does not believe that he has a problem in this area, declines PPI Rx or GI eval...  LBP w/ compression fx T12 in 2009 w/ vertebroplasty>>  FALL 4/12 w/ signif trauma & right rib fxs>   OSTEOPOROSIS>  On calcium, MVI, VitD 2000u/d;  F/u BMD -3.2 in R FemNeck & Alendronate70/wk started 8/15 but pt didn't stick w/ this med & stopped on his own... New part compression T10 found 8/15 on routine CXR>   Other medical issues as noted...   Patient's Medications  New Prescriptions   No medications on file  Previous Medications   ALLOPURINOL (ZYLOPRIM) 100 MG TABLET  TAKE 1 TABLET ONCE DAILY.   ASPIRIN 81 MG TABLET    Take 81 mg by mouth every morning.     AZITHROMYCIN (ZITHROMAX) 250 MG TABLET    Take 2 on day one then 1 daily x 4 days   CARVEDILOL (COREG) 3.125 MG TABLET    TAKE 1 TABLET TWICE DAILY WITH A MEAL.   DEXTROMETHORPHAN-GUAIFENESIN (MUCINEX DM) 30-600 MG PER 12 HR TABLET    Take 1 tablet by mouth 2 (two) times daily as needed for cough.   LISINOPRIL (PRINIVIL,ZESTRIL) 5 MG TABLET    TAKE 1 TABLET ONCE DAILY.   MOMETASONE-FORMOTEROL (DULERA) 200-5 MCG/ACT AERO    Take 2 puffs first thing in am and then another 2 puffs about 12 hours later.   MULTIPLE VITAMIN (MULTIVITAMIN) CAPSULE    Take 1 capsule by mouth daily.   PREDNISONE (DELTASONE) 10 MG TABLET    Take  4 each am x 2 days,   2 each am x 2 days,  1 each am x 2 days and stop   PSYLLIUM (METAMUCIL) 58.6 % POWDER    Take by mouth daily.   SIMVASTATIN (ZOCOR) 40 MG TABLET    TAKE ONE TABLET AT BEDTIME.   VITAMIN D, CHOLECALCIFEROL, PO    Take 2 capsules by mouth daily.  Modified Medications   No medications on file  Discontinued Medications   No medications on file

## 2015-05-09 ENCOUNTER — Ambulatory Visit (INDEPENDENT_AMBULATORY_CARE_PROVIDER_SITE_OTHER): Payer: Medicare Other | Admitting: *Deleted

## 2015-05-09 DIAGNOSIS — I255 Ischemic cardiomyopathy: Secondary | ICD-10-CM

## 2015-05-09 NOTE — Progress Notes (Signed)
Remote ICD transmission.   

## 2015-05-10 LAB — CUP PACEART REMOTE DEVICE CHECK
Battery Voltage: 2.63 V
Brady Statistic AS VP Percent: 0 %
Brady Statistic RV Percent Paced: 0 %
Date Time Interrogation Session: 20160718052411
HIGH POWER IMPEDANCE MEASURED VALUE: 49 Ohm
HighPow Impedance: 72 Ohm
Lead Channel Impedance Value: 475 Ohm
Lead Channel Sensing Intrinsic Amplitude: 12.25 mV
Lead Channel Sensing Intrinsic Amplitude: 2.625 mV
Lead Channel Setting Sensing Sensitivity: 0.6 mV
MDC IDC MSMT LEADCHNL RA SENSING INTR AMPL: 2.625 mV
MDC IDC MSMT LEADCHNL RV IMPEDANCE VALUE: 475 Ohm
MDC IDC MSMT LEADCHNL RV SENSING INTR AMPL: 12.25 mV
MDC IDC SET LEADCHNL RA PACING AMPLITUDE: 2 V
MDC IDC SET LEADCHNL RV PACING AMPLITUDE: 2.5 V
MDC IDC SET LEADCHNL RV PACING PULSEWIDTH: 0.4 ms
MDC IDC SET ZONE DETECTION INTERVAL: 260 ms
MDC IDC SET ZONE DETECTION INTERVAL: 350 ms
MDC IDC SET ZONE DETECTION INTERVAL: 360 ms
MDC IDC STAT BRADY AP VP PERCENT: 0 %
MDC IDC STAT BRADY AP VS PERCENT: 17.88 %
MDC IDC STAT BRADY AS VS PERCENT: 82.12 %
MDC IDC STAT BRADY RA PERCENT PACED: 17.88 %
Zone Setting Detection Interval: 300 ms
Zone Setting Detection Interval: 400 ms

## 2015-05-11 ENCOUNTER — Other Ambulatory Visit (INDEPENDENT_AMBULATORY_CARE_PROVIDER_SITE_OTHER): Payer: Medicare Other

## 2015-05-11 DIAGNOSIS — E7800 Pure hypercholesterolemia, unspecified: Secondary | ICD-10-CM

## 2015-05-11 DIAGNOSIS — I10 Essential (primary) hypertension: Secondary | ICD-10-CM | POA: Diagnosis not present

## 2015-05-11 DIAGNOSIS — R0600 Dyspnea, unspecified: Secondary | ICD-10-CM

## 2015-05-11 DIAGNOSIS — M810 Age-related osteoporosis without current pathological fracture: Secondary | ICD-10-CM | POA: Diagnosis not present

## 2015-05-11 LAB — HEPATIC FUNCTION PANEL
ALBUMIN: 4 g/dL (ref 3.5–5.2)
ALT: 19 U/L (ref 0–53)
AST: 30 U/L (ref 0–37)
Alkaline Phosphatase: 54 U/L (ref 39–117)
BILIRUBIN DIRECT: 0.1 mg/dL (ref 0.0–0.3)
TOTAL PROTEIN: 7.3 g/dL (ref 6.0–8.3)
Total Bilirubin: 0.5 mg/dL (ref 0.2–1.2)

## 2015-05-11 LAB — CBC WITH DIFFERENTIAL/PLATELET
BASOS PCT: 0.8 % (ref 0.0–3.0)
Basophils Absolute: 0.1 10*3/uL (ref 0.0–0.1)
Eosinophils Absolute: 1.1 10*3/uL — ABNORMAL HIGH (ref 0.0–0.7)
Eosinophils Relative: 14.8 % — ABNORMAL HIGH (ref 0.0–5.0)
HCT: 42.8 % (ref 39.0–52.0)
Hemoglobin: 14.4 g/dL (ref 13.0–17.0)
Lymphocytes Relative: 22.3 % (ref 12.0–46.0)
Lymphs Abs: 1.7 10*3/uL (ref 0.7–4.0)
MCHC: 33.7 g/dL (ref 30.0–36.0)
MCV: 103.2 fl — ABNORMAL HIGH (ref 78.0–100.0)
MONOS PCT: 6.6 % (ref 3.0–12.0)
Monocytes Absolute: 0.5 10*3/uL (ref 0.1–1.0)
NEUTROS ABS: 4.3 10*3/uL (ref 1.4–7.7)
Neutrophils Relative %: 55.5 % (ref 43.0–77.0)
PLATELETS: 153 10*3/uL (ref 150.0–400.0)
RBC: 4.14 Mil/uL — ABNORMAL LOW (ref 4.22–5.81)
RDW: 13.1 % (ref 11.5–15.5)
WBC: 7.8 10*3/uL (ref 4.0–10.5)

## 2015-05-11 LAB — BASIC METABOLIC PANEL
BUN: 15 mg/dL (ref 6–23)
CHLORIDE: 102 meq/L (ref 96–112)
CO2: 28 mEq/L (ref 19–32)
CREATININE: 0.95 mg/dL (ref 0.40–1.50)
Calcium: 9.7 mg/dL (ref 8.4–10.5)
GFR: 80.63 mL/min (ref >60.00)
Glucose, Bld: 91 mg/dL (ref 70–99)
POTASSIUM: 5 meq/L (ref 3.5–5.1)
SODIUM: 139 meq/L (ref 135–145)

## 2015-05-11 LAB — LIPID PANEL
CHOL/HDL RATIO: 3
CHOLESTEROL: 127 mg/dL (ref 0–200)
HDL: 41.7 mg/dL (ref >39.00)
LDL Cholesterol: 57 mg/dL (ref 0–99)
NONHDL: 85.3
TRIGLYCERIDES: 140 mg/dL (ref 0.0–149.0)
VLDL: 28 mg/dL (ref 0.0–40.0)

## 2015-05-11 LAB — TSH: TSH: 1.12 u[IU]/mL (ref 0.35–4.50)

## 2015-05-11 LAB — BRAIN NATRIURETIC PEPTIDE: Pro B Natriuretic peptide (BNP): 148 pg/mL — ABNORMAL HIGH (ref 0.0–100.0)

## 2015-05-12 NOTE — Progress Notes (Signed)
Quick Note:  Called and spoke with pt. Reviewed results and recs. Pt voiced understanding and had no further questions. ______ 

## 2015-05-12 NOTE — Progress Notes (Signed)
Quick Note:  Called and spoke to pt. Informed him of the results and recs per SN. Pt verbalized understanding and denied any further questions or concerns at this time.   ______ 

## 2015-05-13 ENCOUNTER — Ambulatory Visit: Payer: Medicare Other | Admitting: Pulmonary Disease

## 2015-05-13 DIAGNOSIS — L309 Dermatitis, unspecified: Secondary | ICD-10-CM | POA: Diagnosis not present

## 2015-05-13 DIAGNOSIS — Z85828 Personal history of other malignant neoplasm of skin: Secondary | ICD-10-CM | POA: Diagnosis not present

## 2015-05-13 DIAGNOSIS — Z1283 Encounter for screening for malignant neoplasm of skin: Secondary | ICD-10-CM | POA: Diagnosis not present

## 2015-05-13 DIAGNOSIS — Z08 Encounter for follow-up examination after completed treatment for malignant neoplasm: Secondary | ICD-10-CM | POA: Diagnosis not present

## 2015-05-13 DIAGNOSIS — L820 Inflamed seborrheic keratosis: Secondary | ICD-10-CM | POA: Diagnosis not present

## 2015-05-14 ENCOUNTER — Other Ambulatory Visit: Payer: Self-pay | Admitting: Pulmonary Disease

## 2015-05-15 LAB — VITAMIN D 1,25 DIHYDROXY
Vitamin D 1, 25 (OH)2 Total: 22 pg/mL (ref 18–72)
Vitamin D3 1, 25 (OH)2: 22 pg/mL

## 2015-06-01 ENCOUNTER — Encounter: Payer: Self-pay | Admitting: Cardiology

## 2015-06-01 ENCOUNTER — Ambulatory Visit: Payer: Medicare Other | Admitting: Pulmonary Disease

## 2015-06-02 ENCOUNTER — Encounter: Payer: Self-pay | Admitting: Family

## 2015-06-03 ENCOUNTER — Ambulatory Visit (INDEPENDENT_AMBULATORY_CARE_PROVIDER_SITE_OTHER): Payer: Medicare Other | Admitting: Family

## 2015-06-03 ENCOUNTER — Ambulatory Visit (HOSPITAL_COMMUNITY)
Admission: RE | Admit: 2015-06-03 | Discharge: 2015-06-03 | Disposition: A | Discharge status: Home / Self Care | Payer: Medicare Other | Source: Ambulatory Visit | Attending: Family | Admitting: Family

## 2015-06-03 ENCOUNTER — Encounter: Payer: Self-pay | Admitting: Family

## 2015-06-03 VITALS — BP 150/82 | HR 62 | Temp 97.7°F | Resp 16 | Ht 71.0 in | Wt 186.0 lb

## 2015-06-03 DIAGNOSIS — I6523 Occlusion and stenosis of bilateral carotid arteries: Secondary | ICD-10-CM | POA: Diagnosis not present

## 2015-06-03 DIAGNOSIS — IMO0001 Reserved for inherently not codable concepts without codable children: Secondary | ICD-10-CM

## 2015-06-03 DIAGNOSIS — Z87891 Personal history of nicotine dependence: Secondary | ICD-10-CM

## 2015-06-03 DIAGNOSIS — I6522 Occlusion and stenosis of left carotid artery: Secondary | ICD-10-CM | POA: Diagnosis not present

## 2015-06-03 DIAGNOSIS — Z48812 Encounter for surgical aftercare following surgery on the circulatory system: Secondary | ICD-10-CM

## 2015-06-03 DIAGNOSIS — Z9889 Other specified postprocedural states: Secondary | ICD-10-CM

## 2015-06-03 DIAGNOSIS — I255 Ischemic cardiomyopathy: Secondary | ICD-10-CM

## 2015-06-03 DIAGNOSIS — R030 Elevated blood-pressure reading, without diagnosis of hypertension: Secondary | ICD-10-CM

## 2015-06-03 NOTE — Patient Instructions (Signed)
Stroke Prevention Some medical conditions and behaviors are associated with an increased chance of having a stroke. You may prevent a stroke by making healthy choices and managing medical conditions. HOW CAN I REDUCE MY RISK OF HAVING A STROKE?   Stay physically active. Get at least 30 minutes of activity on most or all days.  Do not smoke. It may also be helpful to avoid exposure to secondhand smoke.  Limit alcohol use. Moderate alcohol use is considered to be:  No more than 2 drinks per day for men.  No more than 1 drink per day for nonpregnant women.  Eat healthy foods. This involves:  Eating 5 or more servings of fruits and vegetables a day.  Making dietary changes that address high blood pressure (hypertension), high cholesterol, diabetes, or obesity.  Manage your cholesterol levels.  Making food choices that are high in fiber and low in saturated fat, trans fat, and cholesterol may control cholesterol levels.  Take any prescribed medicines to control cholesterol as directed by your health care provider.  Manage your diabetes.  Controlling your carbohydrate and sugar intake is recommended to manage diabetes.  Take any prescribed medicines to control diabetes as directed by your health care provider.  Control your hypertension.  Making food choices that are low in salt (sodium), saturated fat, trans fat, and cholesterol is recommended to manage hypertension.  Take any prescribed medicines to control hypertension as directed by your health care provider.  Maintain a healthy weight.  Reducing calorie intake and making food choices that are low in sodium, saturated fat, trans fat, and cholesterol are recommended to manage weight.  Stop drug abuse.  Avoid taking birth control pills.  Talk to your health care provider about the risks of taking birth control pills if you are over 35 years old, smoke, get migraines, or have ever had a blood clot.  Get evaluated for sleep  disorders (sleep apnea).  Talk to your health care provider about getting a sleep evaluation if you snore a lot or have excessive sleepiness.  Take medicines only as directed by your health care provider.  For some people, aspirin or blood thinners (anticoagulants) are helpful in reducing the risk of forming abnormal blood clots that can lead to stroke. If you have the irregular heart rhythm of atrial fibrillation, you should be on a blood thinner unless there is a good reason you cannot take them.  Understand all your medicine instructions.  Make sure that other conditions (such as anemia or atherosclerosis) are addressed. SEEK IMMEDIATE MEDICAL CARE IF:   You have sudden weakness or numbness of the face, arm, or leg, especially on one side of the body.  Your face or eyelid droops to one side.  You have sudden confusion.  You have trouble speaking (aphasia) or understanding.  You have sudden trouble seeing in one or both eyes.  You have sudden trouble walking.  You have dizziness.  You have a loss of balance or coordination.  You have a sudden, severe headache with no known cause.  You have new chest pain or an irregular heartbeat. Any of these symptoms may represent a serious problem that is an emergency. Do not wait to see if the symptoms will go away. Get medical help at once. Call your local emergency services (911 in [place] Do not drive yourself to the hospital. Document Released: 11/15/2004 Document Revised: 02/22/2014 Document Reviewed: 04/10/2013 [employer] Patient Information 2015 [employer]. This information is not intended to replace advice given   to you by your health care provider. Make sure you discuss any questions you have with your health care provider.  

## 2015-06-03 NOTE — Progress Notes (Signed)
Filed Vitals:   06/03/15 1124 06/03/15 1126 06/03/15 1131 06/03/15 1132  BP: 174/84 170/80 161/79 150/82  Pulse: 66 63 66 62  Temp:  97.7 F (36.5 C)    TempSrc:  Oral    Resp:  16    Height:  5\' 11"  (1.803 m)    Weight:  186 lb (84.369 kg)    SpO2:  98%

## 2015-06-03 NOTE — Progress Notes (Signed)
Established Carotid Patient   History of Present Illness  [name] is a 81 y.o. male  patient of Dr. Scot Dock who is s/p right CEA in March of 2012.  Patient has no history of TIA or stroke symptoms. The patient denies amaurosis fugax or monocular blindness. The patient denies facial drooping. Pt. denies hemiplegia. The patient denies receptive or expressive aphasia.   He reports that he cannot remember things like he used to. He denies claudication symptoms in legs with walking, denies non healing wounds. He is caregiver for his wife.  Pt denies New Medical or Surgical History.  Pt Diabetic: No Pt smoker: former smoker, quit cigars in 2013, quit cigarettes several years prior to that  Pt meds include: Statin : Yes ASA: Yes Other anticoagulants/antiplatelets: no   Past Medical History  Diagnosis Date  . Allergic rhinitis   . COPD (chronic obstructive pulmonary disease)   . Tobacco abuse   . Hypertension   . Atherosclerotic heart disease   . Ischemic cardiomyopathy     EF improved to normal: Echo 8/14:  Mild LVH, focal basal hypertrophy, EF 60-65%, normal wall motion, mild BAE, PASP 36  . Syncope   . Presence of cardiac defibrillator   . Peripheral vascular disease   . Hypercholesterolemia   . Diverticulosis of colon   . History of colonic polyps   . Hemorrhoids   . Abnormal liver function tests   . History of gout   . Back pain   . Compression fracture   . Osteoporosis   . Dermatitis   . T wave over sensing resulting in inappropriate shocks 08/14/2013  . DJD (degenerative joint disease)     and Gout  . CAD (coronary artery disease)   . Myocardial infarction     Social History Social History  Substance Use Topics  . Smoking status: Former Smoker    Types: Cigars    Quit date: 10/22/2012  . Smokeless tobacco: Never Used  . Alcohol Use: 1.8 oz/week    3 Glasses of wine per week    Family History Family History  Problem Relation Age of Onset   . Hypertension Father   . Heart disease Father     Heart Disease before age 66  . Hypertension Mother   . Heart disease Mother     Heart Disease before age 14  . Cancer Mother     Surgical History Past Surgical History  Procedure Laterality Date  . Abdominal aortic aneurysm repair  2002    by Dr. Kellie Simmering  . Cad stent  02/2002    Dr. Percival Spanish  . Aicd placed  03/2008    Dr. Caryl Comes  . Right corotid enderectomy  12/2010    Dr. Scot Dock  . Carotid endarterectomy Right January 02, 2011  . Cardiac defibrillator placement  2010    No Known Allergies  Current Outpatient Prescriptions  Medication Sig Dispense Refill  . allopurinol (ZYLOPRIM) 100 MG tablet TAKE 1 TABLET ONCE DAILY. 30 tablet 0  . aspirin 81 MG tablet Take 81 mg by mouth every morning.     Marland Kitchen azithromycin (ZITHROMAX) 250 MG tablet Take 2 on day one then 1 daily x 4 days 6 tablet 0  . carvedilol (COREG) 3.125 MG tablet TAKE 1 TABLET TWICE DAILY WITH A MEAL. 60 tablet 6  . dextromethorphan-guaiFENesin (MUCINEX DM) 30-600 MG per 12 hr tablet Take 1 tablet by mouth 2 (two) times daily as needed for cough.    Marland Kitchen lisinopril (PRINIVIL,ZESTRIL)  5 MG tablet TAKE 1 TABLET ONCE DAILY. 30 tablet 0  . mometasone-formoterol (DULERA) 200-5 MCG/ACT AERO Take 2 puffs first thing in am and then another 2 puffs about 12 hours later.    . Multiple Vitamin (MULTIVITAMIN) capsule Take 1 capsule by mouth daily.    . predniSONE (DELTASONE) 10 MG tablet Take  4 each am x 2 days,   2 each am x 2 days,  1 each am x 2 days and stop 14 tablet 0  . psyllium (METAMUCIL) 58.6 % powder Take by mouth daily.    . simvastatin (ZOCOR) 40 MG tablet TAKE ONE TABLET AT BEDTIME. 90 tablet 1  . VITAMIN D, CHOLECALCIFEROL, PO Take 2 capsules by mouth daily.     No current facility-administered medications for this visit.    Review of Systems : See HPI for pertinent positives and negatives.  Physical Examination  Filed Vitals:   06/03/15 1124 06/03/15 1126  06/03/15 1131 06/03/15 1132  BP: 174/84 170/80 161/79 150/82  Pulse: 66 63 66 62  Temp:  97.7 F (36.5 C)    TempSrc:  Oral    Resp:  16    Height:  5\' 11"  (1.803 m)    Weight:  186 lb (84.369 kg)    SpO2:  98%     Body mass index is 25.95 kg/(m^2).  General: WDWN male in NAD GAIT: normal Eyes: PERRLA Pulmonary: Non-labored, CTAB, Negative Rales, Negative rhonchi, & Negative wheezing.  Cardiac: regular Rhythm, no detected murmur, pacemaker/defibrillator palpated subcutaneously left upper chest.  VASCULAR EXAM Carotid Bruits Right Left   Negative Negative    Radial pulses are 2+2 palpable and equal.     Gastrointestinal: soft, nontender, BS WNL, no r/g,no palpable masses.  Musculoskeletal: No muscle atrophy/wasting. M/S 5/5 throughout, Extremities without ischemic changes.  Skin: Seborrheic dermatitis on upper face.  Neurologic: A&O X 3; Appropriate Affect; SENSATION: normal;  Speech is normal CN 2-12 intact, Pain and light touch intact in extremities, Motor exam as listed above.          Non-Invasive Vascular Imaging CAROTID DUPLEX 06/03/2015   CEREBROVASCULAR DUPLEX EVALUATION    INDICATION: Carotid artery disease    PREVIOUS INTERVENTION(S): Right carotid endarterectomy 01/02/2011    DUPLEX EXAM: Carotid duplex    RIGHT  LEFT  Peak Systolic Velocities (cm/s) End Diastolic Velocities (cm/s) Plaque LOCATION Peak Systolic Velocities (cm/s) End Diastolic Velocities (cm/s) Plaque  56 8 - CCA PROXIMAL 95 12 HT  48 9 - CCA MID 81 12 -  53 9 HT CCA DISTAL 68 4 HT  76 9 HT ECA 128 14 HT  40 9 HM ICA PROXIMAL 150 33 CP  69 17  ICA MID 108 19 -  106 24  ICA DISTAL 99 24 -    N/A ICA / CCA Ratio (PSV) 1.8  Antegrade Vertebral Flow Antegrade  482 Brachial Systolic Pressure (mmHg) 707  Triphasic Brachial Artery Waveforms  Triphasic    Plaque Morphology:  HM = Homogeneous, HT = Heterogeneous, CP = Calcific Plaque, SP = Smooth Plaque, IP = Irregular Plaque  ADDITIONAL FINDINGS:     IMPRESSION: 1. Patent right carotid endarterectomy with  significant hyperplasia noted, velocity within the less than 40% range. 2. Less than 40% left internal carotid artery stenosis, calcific plaque may obscure higher velocity    Compared to the previous exam:  No significant change      Assessment: [name] is a 81 y.o. male who is s/p right  CEA in March of 2012. He has no history of stroke or TIA. Today's carotid Duplex suggests a patent right carotid endarterectomy with  significant hyperplasia noted, velocity within the less than 40% stenosis. Less than 40% left internal carotid artery stenosis, calcific plaque may obscure higher velocity. No significant change from 05/28/14.  Elevated blood pressure -pt advised to see his PCP ASAP re this.  Plan: Follow-up in 1 year with Carotid Duplex.   I discussed in depth with the patient the nature of atherosclerosis, and emphasized the importance of maximal medical management including strict control of blood pressure, blood glucose, and lipid levels, obtaining regular exercise, and continued cessation of smoking.  The patient is aware that without maximal medical management the underlying atherosclerotic disease process will progress, limiting the benefit of any interventions. The patient was given information about stroke prevention and what symptoms should prompt the patient to seek immediate medical care. Thank you for allowing [place] to participate in this patient's care.  Clemon Chambers, RN, MSN, FNP-C [facility] Office: [phone]  Clinic Physician: Bridgett Larsson  06/03/2015 12:06 PM

## 2015-06-04 ENCOUNTER — Other Ambulatory Visit: Payer: Self-pay | Admitting: Pulmonary Disease

## 2015-06-06 ENCOUNTER — Ambulatory Visit (INDEPENDENT_AMBULATORY_CARE_PROVIDER_SITE_OTHER): Payer: Medicare Other | Admitting: *Deleted

## 2015-06-06 DIAGNOSIS — I255 Ischemic cardiomyopathy: Secondary | ICD-10-CM

## 2015-06-06 NOTE — Addendum Note (Signed)
Addended by: Dorthula Rue L on: 06/06/2015 10:20 AM   Modules accepted: Orders

## 2015-06-06 NOTE — Progress Notes (Signed)
Remote ICD transmission.   

## 2015-06-08 ENCOUNTER — Encounter: Payer: Self-pay | Admitting: Internal Medicine

## 2015-06-09 LAB — CUP PACEART REMOTE DEVICE CHECK
Brady Statistic AP VP Percent: 0 %
Brady Statistic AS VP Percent: 0 %
Brady Statistic AS VS Percent: 92.39 %
Date Time Interrogation Session: 20160815062707
HIGH POWER IMPEDANCE MEASURED VALUE: 66 Ohm
HighPow Impedance: 48 Ohm
Lead Channel Impedance Value: 494 Ohm
Lead Channel Sensing Intrinsic Amplitude: 14.125 mV
Lead Channel Sensing Intrinsic Amplitude: 2.375 mV
Lead Channel Setting Pacing Amplitude: 2 V
Lead Channel Setting Pacing Amplitude: 2.5 V
Lead Channel Setting Pacing Pulse Width: 0.4 ms
MDC IDC MSMT BATTERY VOLTAGE: 2.62 V
MDC IDC MSMT LEADCHNL RA IMPEDANCE VALUE: 475 Ohm
MDC IDC MSMT LEADCHNL RA SENSING INTR AMPL: 2.375 mV
MDC IDC MSMT LEADCHNL RV SENSING INTR AMPL: 14.125 mV
MDC IDC SET LEADCHNL RV SENSING SENSITIVITY: 0.6 mV
MDC IDC SET ZONE DETECTION INTERVAL: 360 ms
MDC IDC STAT BRADY AP VS PERCENT: 7.61 %
MDC IDC STAT BRADY RA PERCENT PACED: 7.61 %
MDC IDC STAT BRADY RV PERCENT PACED: 0 %
Zone Setting Detection Interval: 260 ms
Zone Setting Detection Interval: 300 ms
Zone Setting Detection Interval: 350 ms
Zone Setting Detection Interval: 400 ms

## 2015-06-18 ENCOUNTER — Other Ambulatory Visit: Payer: Self-pay | Admitting: Pulmonary Disease

## 2015-07-01 ENCOUNTER — Encounter: Payer: Self-pay | Admitting: *Deleted

## 2015-07-02 ENCOUNTER — Other Ambulatory Visit: Payer: Self-pay | Admitting: Pulmonary Disease

## 2015-07-04 ENCOUNTER — Ambulatory Visit (INDEPENDENT_AMBULATORY_CARE_PROVIDER_SITE_OTHER): Payer: Medicare Other | Admitting: *Deleted

## 2015-07-04 DIAGNOSIS — I255 Ischemic cardiomyopathy: Secondary | ICD-10-CM

## 2015-07-04 NOTE — Progress Notes (Signed)
Remote ICD transmission.   

## 2015-07-05 LAB — CUP PACEART REMOTE DEVICE CHECK
Lead Channel Setting Pacing Amplitude: 2 V
Lead Channel Setting Pacing Amplitude: 2.5 V
Lead Channel Setting Pacing Pulse Width: 0.4 ms
Lead Channel Setting Sensing Sensitivity: 0.6 mV
MDC IDC SESS DTM: 20160913132521
MDC IDC SET ZONE DETECTION INTERVAL: 300 ms
MDC IDC SET ZONE DETECTION INTERVAL: 350 ms
Zone Setting Detection Interval: 260 ms
Zone Setting Detection Interval: 360 ms
Zone Setting Detection Interval: 400 ms

## 2015-07-08 ENCOUNTER — Other Ambulatory Visit: Payer: Self-pay | Admitting: Cardiology

## 2015-07-08 NOTE — Telephone Encounter (Signed)
Rx has been sent to the pharmacy electronically. ° °

## 2015-07-11 ENCOUNTER — Encounter: Payer: Self-pay | Admitting: Internal Medicine

## 2015-07-23 DIAGNOSIS — Z23 Encounter for immunization: Secondary | ICD-10-CM | POA: Diagnosis not present

## 2015-07-25 ENCOUNTER — Other Ambulatory Visit: Payer: Self-pay | Admitting: Pulmonary Disease

## 2015-07-27 ENCOUNTER — Encounter: Payer: Self-pay | Admitting: Cardiology

## 2015-08-04 ENCOUNTER — Ambulatory Visit (INDEPENDENT_AMBULATORY_CARE_PROVIDER_SITE_OTHER): Payer: Medicare Other | Admitting: *Deleted

## 2015-08-04 DIAGNOSIS — I255 Ischemic cardiomyopathy: Secondary | ICD-10-CM

## 2015-08-04 NOTE — Progress Notes (Signed)
Remote ICD transmission.   

## 2015-08-05 ENCOUNTER — Other Ambulatory Visit: Payer: Self-pay | Admitting: Pulmonary Disease

## 2015-08-05 LAB — CUP PACEART REMOTE DEVICE CHECK
Brady Statistic AP VP Percent: 0 %
Brady Statistic AS VP Percent: 0 %
Brady Statistic AS VS Percent: 83.42 %
Brady Statistic RV Percent Paced: 0 %
HIGH POWER IMPEDANCE MEASURED VALUE: 69 Ohm
HighPow Impedance: 50 Ohm
Implantable Lead Implant Date: 20090623
Implantable Lead Implant Date: 20090623
Implantable Lead Location: 753859
Lead Channel Impedance Value: 494 Ohm
Lead Channel Impedance Value: 494 Ohm
Lead Channel Sensing Intrinsic Amplitude: 10 mV
Lead Channel Sensing Intrinsic Amplitude: 2.25 mV
Lead Channel Setting Pacing Amplitude: 2.5 V
MDC IDC LEAD LOCATION: 753860
MDC IDC MSMT BATTERY VOLTAGE: 2.62 V
MDC IDC MSMT LEADCHNL RA SENSING INTR AMPL: 2.25 mV
MDC IDC MSMT LEADCHNL RV SENSING INTR AMPL: 10 mV
MDC IDC SESS DTM: 20161013063327
MDC IDC SET LEADCHNL RA PACING AMPLITUDE: 2 V
MDC IDC SET LEADCHNL RV PACING PULSEWIDTH: 0.4 ms
MDC IDC SET LEADCHNL RV SENSING SENSITIVITY: 0.6 mV
MDC IDC SET ZONE DETECTION INTERVAL: 360 ms
MDC IDC STAT BRADY AP VS PERCENT: 16.58 %
MDC IDC STAT BRADY RA PERCENT PACED: 16.58 %
Zone Setting Detection Interval: 260 ms
Zone Setting Detection Interval: 300 ms
Zone Setting Detection Interval: 350 ms

## 2015-08-08 ENCOUNTER — Encounter: Payer: Self-pay | Admitting: Cardiology

## 2015-08-08 ENCOUNTER — Encounter: Payer: Self-pay | Admitting: Internal Medicine

## 2015-08-12 DIAGNOSIS — H35372 Puckering of macula, left eye: Secondary | ICD-10-CM | POA: Diagnosis not present

## 2015-08-12 DIAGNOSIS — H353131 Nonexudative age-related macular degeneration, bilateral, early dry stage: Secondary | ICD-10-CM | POA: Diagnosis not present

## 2015-08-26 ENCOUNTER — Ambulatory Visit (INDEPENDENT_AMBULATORY_CARE_PROVIDER_SITE_OTHER): Payer: Medicare Other | Admitting: Internal Medicine

## 2015-08-26 ENCOUNTER — Encounter: Payer: Self-pay | Admitting: Internal Medicine

## 2015-08-26 VITALS — BP 100/56 | HR 63 | Ht 71.0 in | Wt 197.0 lb

## 2015-08-26 DIAGNOSIS — I255 Ischemic cardiomyopathy: Secondary | ICD-10-CM

## 2015-08-26 DIAGNOSIS — R55 Syncope and collapse: Secondary | ICD-10-CM | POA: Diagnosis not present

## 2015-08-26 DIAGNOSIS — I480 Paroxysmal atrial fibrillation: Secondary | ICD-10-CM | POA: Diagnosis not present

## 2015-08-26 LAB — CUP PACEART INCLINIC DEVICE CHECK
Battery Voltage: 2.61 V
Brady Statistic AP VP Percent: 0 %
Brady Statistic AP VS Percent: 17.73 %
Brady Statistic AS VP Percent: 0 %
Brady Statistic RA Percent Paced: 17.73 %
Brady Statistic RV Percent Paced: 0 %
Date Time Interrogation Session: 20161104161346
HIGH POWER IMPEDANCE MEASURED VALUE: 50 Ohm
HIGH POWER IMPEDANCE MEASURED VALUE: 70 Ohm
Implantable Lead Implant Date: 20090623
Implantable Lead Location: 753860
Implantable Lead Model: 5076
Implantable Lead Model: 6947
Lead Channel Impedance Value: 494 Ohm
Lead Channel Pacing Threshold Amplitude: 0.75 V
Lead Channel Pacing Threshold Amplitude: 0.75 V
Lead Channel Pacing Threshold Pulse Width: 0.4 ms
Lead Channel Sensing Intrinsic Amplitude: 15.625 mV
Lead Channel Setting Sensing Sensitivity: 0.6 mV
MDC IDC LEAD IMPLANT DT: 20090623
MDC IDC LEAD LOCATION: 753859
MDC IDC MSMT LEADCHNL RA IMPEDANCE VALUE: 494 Ohm
MDC IDC MSMT LEADCHNL RA SENSING INTR AMPL: 2.125 mV
MDC IDC MSMT LEADCHNL RV PACING THRESHOLD PULSEWIDTH: 0.4 ms
MDC IDC SET LEADCHNL RA PACING AMPLITUDE: 2 V
MDC IDC SET LEADCHNL RV PACING AMPLITUDE: 2.5 V
MDC IDC SET LEADCHNL RV PACING PULSEWIDTH: 0.4 ms
MDC IDC STAT BRADY AS VS PERCENT: 82.27 %

## 2015-08-26 NOTE — Patient Instructions (Signed)
Medication Instructions: - no changes  Labwork: - none  Procedures/Testing: - none  Follow-Up: - Remote monitoring is used to monitor your Pacemaker of ICD from home. This monitoring reduces the number of office visits required to check your device to one time per year. It allows [place] to keep an eye on the functioning of your device to ensure it is working properly. You are scheduled for a device check from home on 09/25/15. You may send your transmission at any time that day. If you have a wireless device, the transmission will be sent automatically. After your physician reviews your transmission, you will receive a postcard with your next transmission date.  Any Additional Special Instructions Will Be Listed Below (If Applicable). - none

## 2015-08-26 NOTE — Progress Notes (Signed)
Patient Care Team: Noralee Space, MD as PCP - General (Pulmonary Disease)   HPI  [name] is a 81 y.o. male seen in followup for ischemic heart disease modest depression of LV function with some interval improvement with ejection fraction of 40-45% by echo in April2011; he status post ICD implantation for primary prevention.   His he was shocked in June 2104  for T wave over sensing. His device was reprogrammed changing the sensitivity from 0.3--0.6. I should also note there has been intercurrent loss of R-wave amplitude from 5--3.5 mV   The patient denies chest pain, shortness of breath, nocturnal dyspnea, orthopnea or peripheral edema.  There have been no palpitations, lightheadedness or syncope.       Past Medical History  Diagnosis Date  . Allergic rhinitis   . COPD (chronic obstructive pulmonary disease) ([place])   . Tobacco abuse   . Hypertension   . Atherosclerotic heart disease   . Ischemic cardiomyopathy     EF improved to normal: Echo 8/14:  Mild LVH, focal basal hypertrophy, EF 60-65%, normal wall motion, mild BAE, PASP 36  . Syncope   . Presence of cardiac defibrillator   . Peripheral vascular disease ([place])   . Hypercholesterolemia   . Diverticulosis of colon   . History of colonic polyps   . Hemorrhoids   . Abnormal liver function tests   . History of gout   . Back pain   . Compression fracture   . Osteoporosis   . Dermatitis   . T wave over sensing resulting in inappropriate shocks 08/14/2013  . DJD (degenerative joint disease)     and Gout  . CAD (coronary artery disease)   . Myocardial infarction [facility])     Past Surgical History  Procedure Laterality Date  . Abdominal aortic aneurysm repair  2002    by Dr. Kellie Simmering  . Cad stent  02/2002    Dr. Percival Spanish  . Aicd placed  03/2008    Dr. Caryl Comes  . Right corotid enderectomy  12/2010    Dr. Scot Dock  . Carotid endarterectomy Right January 02, 2011  . Cardiac defibrillator placement  2010    Current  Outpatient Prescriptions  Medication Sig Dispense Refill  . ADVAIR DISKUS 250-50 MCG/DOSE AEPB INHALE 1 PUFF TWICE DAILY. 60 each 0  . allopurinol (ZYLOPRIM) 100 MG tablet TAKE 1 TABLET ONCE DAILY. 30 tablet 3  . aspirin 81 MG tablet Take 81 mg by mouth every morning.     . carvedilol (COREG) 3.125 MG tablet TAKE 1 TABLET TWICE DAILY WITH A MEAL. 60 tablet 6  . lisinopril (PRINIVIL,ZESTRIL) 5 MG tablet TAKE 1 TABLET ONCE DAILY. 30 tablet 6  . Multiple Vitamin (MULTIVITAMIN) capsule Take 1 capsule by mouth daily.    . psyllium (METAMUCIL) 58.6 % powder Take by mouth daily.    Marland Kitchen VITAMIN D, CHOLECALCIFEROL, PO Take 2 capsules by mouth daily.     No current facility-administered medications for this visit.    No Known Allergies  Review of Systems negative except from HPI and PMH  Physical Exam BP 100/56 mmHg  Pulse 63  Ht 5\' 11"  (1.803 m)  Wt 197 lb (89.359 kg)  BMI 27.49 kg/m2 Well developed and nourished in no acute distress HENT normal Neck supple with JVP-flat occ wheeze Device pocket well healed; without hematoma or erythema.  There is no tethering Regular rate and rhythm, no murmurs or gallops Abd-soft with active BS No Clubbing  cyanosis edema Skin-warm and dry senile keratosos A & Oriented  Grossly normal sensory and motor function  ECG sinus 62  19/16/49 Inferior MI    Assessment and  Plan  Implantable I voltage low-voltage Ischemic cardiiomyopathy  Without symptoms of ischemia

## 2015-09-12 ENCOUNTER — Telehealth: Payer: Self-pay | Admitting: Cardiology

## 2015-09-12 ENCOUNTER — Telehealth: Payer: Self-pay | Admitting: *Deleted

## 2015-09-12 NOTE — Telephone Encounter (Signed)
Pt wife is going to have pt return call.

## 2015-09-12 NOTE — Telephone Encounter (Signed)
Pt called and stated that his ICD was making the tune. Instructed pt to send a remote transmission. Pt verbalized understanding.

## 2015-09-12 NOTE — Telephone Encounter (Signed)
[employer] alert- ICD recommended to be replaced. Pt made aware- alert tones can be turned off in office at pt's request. He reports it isn't bothering him. He is not sure he would like to have the battery replaced, he says he may wish to have the ICD turned off.  I will relay information to Dr. Caryl Comes- if pt elects to have it changed in the near future he can call us back to schedule replacement. Pt agreeable

## 2015-09-12 NOTE — Telephone Encounter (Signed)
plese sschedule WOKO

## 2015-09-12 NOTE — Telephone Encounter (Signed)
Informed pt that his battery has reached elective replacement time. Pt aware that his device will make the alert tone everyday for 16 days and then it will stop. This does not mean that his battery has died or has gained energy it is just defaulted to stop after day 16. Pt aware that a scheduler will call him to schedule an appt w/ MD.

## 2015-09-19 ENCOUNTER — Other Ambulatory Visit: Payer: Self-pay | Admitting: Pulmonary Disease

## 2015-09-20 ENCOUNTER — Encounter: Payer: Self-pay | Admitting: Internal Medicine

## 2015-09-23 ENCOUNTER — Encounter: Payer: Self-pay | Admitting: Internal Medicine

## 2015-10-03 ENCOUNTER — Ambulatory Visit: Payer: Medicare Other

## 2015-10-03 DIAGNOSIS — Z23 Encounter for immunization: Secondary | ICD-10-CM

## 2015-10-03 MED ORDER — PNEUMOCOCCAL 13-VAL CONJ VACC IM SUSP
0.5000 mL | Freq: Once | INTRAMUSCULAR | Status: AC
  Administered 2015-10-03: 0.5 mL via INTRAMUSCULAR

## 2015-10-12 ENCOUNTER — Other Ambulatory Visit: Payer: Self-pay | Admitting: Pulmonary Disease

## 2015-10-26 ENCOUNTER — Encounter: Payer: Self-pay | Admitting: Internal Medicine

## 2015-10-26 ENCOUNTER — Ambulatory Visit (INDEPENDENT_AMBULATORY_CARE_PROVIDER_SITE_OTHER): Payer: Medicare Other | Admitting: Internal Medicine

## 2015-10-26 VITALS — BP 128/64 | HR 61 | Ht 71.0 in | Wt 187.8 lb

## 2015-10-26 DIAGNOSIS — Z95810 Presence of automatic (implantable) cardiac defibrillator: Secondary | ICD-10-CM

## 2015-10-26 DIAGNOSIS — I255 Ischemic cardiomyopathy: Secondary | ICD-10-CM

## 2015-10-26 DIAGNOSIS — Z7189 Other specified counseling: Secondary | ICD-10-CM

## 2015-10-26 LAB — CUP PACEART INCLINIC DEVICE CHECK
Brady Statistic AP VS Percent: 13.8 %
Brady Statistic AS VP Percent: 0 %
Brady Statistic RA Percent Paced: 13.8 %
Brady Statistic RV Percent Paced: 0 %
Date Time Interrogation Session: 20170104145157
HIGH POWER IMPEDANCE MEASURED VALUE: 48 Ohm
HIGH POWER IMPEDANCE MEASURED VALUE: 65 Ohm
Implantable Lead Location: 753860
Implantable Lead Model: 5076
Implantable Lead Model: 6947
Lead Channel Pacing Threshold Amplitude: 1 V
Lead Channel Pacing Threshold Amplitude: 1 V
Lead Channel Pacing Threshold Pulse Width: 0.4 ms
Lead Channel Sensing Intrinsic Amplitude: 8.75 mV
Lead Channel Setting Sensing Sensitivity: 0.6 mV
MDC IDC LEAD IMPLANT DT: 20090623
MDC IDC LEAD IMPLANT DT: 20090623
MDC IDC LEAD LOCATION: 753859
MDC IDC MSMT BATTERY VOLTAGE: 2.61 V
MDC IDC MSMT LEADCHNL RA IMPEDANCE VALUE: 475 Ohm
MDC IDC MSMT LEADCHNL RA SENSING INTR AMPL: 2 mV
MDC IDC MSMT LEADCHNL RV IMPEDANCE VALUE: 475 Ohm
MDC IDC MSMT LEADCHNL RV PACING THRESHOLD PULSEWIDTH: 0.4 ms
MDC IDC SET LEADCHNL RA PACING AMPLITUDE: 2 V
MDC IDC SET LEADCHNL RV PACING AMPLITUDE: 2.5 V
MDC IDC SET LEADCHNL RV PACING PULSEWIDTH: 0.4 ms
MDC IDC STAT BRADY AP VP PERCENT: 0 %
MDC IDC STAT BRADY AS VS PERCENT: 86.2 %

## 2015-10-26 NOTE — Progress Notes (Signed)
Patient Care Team: Noralee Space, MD as PCP - General (Pulmonary Disease)   HPI  [name] is a 82 y.o. male seen in followup for ischemic heart disease modest depression of LV function with some interval improvement with ejection fraction of 40-45% by echo in April2011; he status post ICD implantation for primary prevention.   His he was shocked in June 2104  for T wave over sensing. His device was reprogrammed changing the sensitivity from 0.3--0.6. I should also note there has been intercurrent loss of R-wave amplitude from 5--3.5 mV   The patient denies chest pain, shortness of breath, nocturnal dyspnea, orthopnea or peripheral edema.  There have been no palpitations, lightheadedness or syncope.    His device has reached ERI       Past Medical History  Diagnosis Date  . Allergic rhinitis   . COPD (chronic obstructive pulmonary disease) ([place])   . Tobacco abuse   . Hypertension   . Atherosclerotic heart disease   . Ischemic cardiomyopathy     EF improved to normal: Echo 8/14:  Mild LVH, focal basal hypertrophy, EF 60-65%, normal wall motion, mild BAE, PASP 36  . Syncope   . Presence of cardiac defibrillator   . Peripheral vascular disease ([place])   . Hypercholesterolemia   . Diverticulosis of colon   . History of colonic polyps   . Hemorrhoids   . Abnormal liver function tests   . History of gout   . Back pain   . Compression fracture   . Osteoporosis   . Dermatitis   . T wave over sensing resulting in inappropriate shocks 08/14/2013  . DJD (degenerative joint disease)     and Gout  . CAD (coronary artery disease)   . Myocardial infarction [facility])     Past Surgical History  Procedure Laterality Date  . Abdominal aortic aneurysm repair  2002    by Dr. Kellie Simmering  . Cad stent  02/2002    Dr. Percival Spanish  . Aicd placed  03/2008    Dr. Caryl Comes  . Right corotid enderectomy  12/2010    Dr. Scot Dock  . Carotid endarterectomy Right January 02, 2011  . Cardiac  defibrillator placement  2010    Current Outpatient Prescriptions  Medication Sig Dispense Refill  . ADVAIR DISKUS 250-50 MCG/DOSE AEPB Inhale 1 puff into the lungs 2 (two) times daily.   0  . allopurinol (ZYLOPRIM) 100 MG tablet Take 100 mg by mouth daily.   0  . aspirin 81 MG tablet Take 81 mg by mouth daily.    . carvedilol (COREG) 3.125 MG tablet Take 3.125 mg by mouth 2 (two) times daily with a meal.   5  . lisinopril (PRINIVIL,ZESTRIL) 5 MG tablet Take 5 mg by mouth daily.   5  . Multiple Vitamin (MULTIVITAMIN) capsule Take 1 capsule by mouth daily.    . psyllium (METAMUCIL) 58.6 % powder Take 1 packet by mouth daily.     Marland Kitchen VITAMIN D, CHOLECALCIFEROL, PO Take 2 capsules by mouth daily.     No current facility-administered medications for this visit.    No Known Allergies  Review of Systems negative except from HPI and PMH  Physical Exam BP 128/64 mmHg  Pulse 61  Ht 5\' 11"  (1.803 m)  Wt 187 lb 12.8 oz (85.186 kg)  BMI 26.20 kg/m2 Well developed and nourished in no acute distress HENT normal Neck supple with JVP-flat occ wheeze Device pocket well healed; without  hematoma or erythema.  There is no tethering Regular rate and rhythm, no murmurs or gallops Abd-soft with active BS No Clubbing cyanosis edema Skin-warm and dry senile keratosos A & Oriented  Grossly normal sensory and motor function      Assessment and  Plan  Implantable Defibirllator  Ischemic cardiiomyopathy  Interval normalization of LV function   Without symptoms of ischemia  We have had a lengthy discussion regarding end of life and his ICD  We have decided to neither change out or explant his current pulsegenerator based on attenuated benefit of ICD with age and interval resolution (5/14) of LV dysfunction  We have inactivated all alerts Therapies have been left active We will see as needed  More than 50% of 45 min was spent in counseling related to the above

## 2015-10-26 NOTE — Patient Instructions (Signed)
Medication Instructions: - no changes  Labwork: - none  Procedures/Testing: - none  Follow-Up: - Dr. Caryl Comes will see you back on an as needed basis.  - Your physician wants you to follow-up in: May 2017 with Dr. Percival Spanish. You will receive a reminder letter in the mail two months in advance. If you don't receive a letter, please call our office to schedule the follow-up appointment.  Any Additional Special Instructions Will Be Listed Below (If Applicable).

## 2015-10-27 ENCOUNTER — Telehealth: Payer: Self-pay | Admitting: Pulmonary Disease

## 2015-10-27 DIAGNOSIS — H04123 Dry eye syndrome of bilateral lacrimal glands: Secondary | ICD-10-CM | POA: Diagnosis not present

## 2015-10-27 DIAGNOSIS — H01002 Unspecified blepharitis right lower eyelid: Secondary | ICD-10-CM | POA: Diagnosis not present

## 2015-10-27 DIAGNOSIS — H01005 Unspecified blepharitis left lower eyelid: Secondary | ICD-10-CM | POA: Diagnosis not present

## 2015-10-27 DIAGNOSIS — H01001 Unspecified blepharitis right upper eyelid: Secondary | ICD-10-CM | POA: Diagnosis not present

## 2015-10-27 DIAGNOSIS — H01004 Unspecified blepharitis left upper eyelid: Secondary | ICD-10-CM | POA: Diagnosis not present

## 2015-10-27 DIAGNOSIS — H10501 Unspecified blepharoconjunctivitis, right eye: Secondary | ICD-10-CM | POA: Diagnosis not present

## 2015-10-27 MED ORDER — PREDNISONE 5 MG (48) PO TBPK: ORAL_TABLET | ORAL | Status: DC

## 2015-10-27 MED ORDER — LEVOFLOXACIN 500 MG PO TABS: 500.0000 mg | ORAL_TABLET | Freq: Every day | ORAL | Status: DC

## 2015-10-27 NOTE — Telephone Encounter (Signed)
Per SN: okay to call in Levaquin 500 mg QD x 7 days, pred dose pak 5mg  12 day pack, mucinex 600 mg 2 po BID, increase fluids ---  Called spoke with daughter [name]. She is aware of recs. RX's sent in. Nothing further needed

## 2015-10-27 NOTE — Telephone Encounter (Signed)
Spoke with [name]. Pt c/o prod cough (clear phlem), sneezing, runny nose, congestion x 3 days. Not taking anything OTC. Please advise SN thanks  No Known Allergies   Current Outpatient Prescriptions on File Prior to Visit  Medication Sig Dispense Refill  . ADVAIR DISKUS 250-50 MCG/DOSE AEPB Inhale 1 puff into the lungs 2 (two) times daily.   0  . allopurinol (ZYLOPRIM) 100 MG tablet Take 100 mg by mouth daily.   0  . aspirin 81 MG tablet Take 81 mg by mouth daily.    . carvedilol (COREG) 3.125 MG tablet Take 3.125 mg by mouth 2 (two) times daily with a meal.   5  . lisinopril (PRINIVIL,ZESTRIL) 5 MG tablet Take 5 mg by mouth daily.   5  . Multiple Vitamin (MULTIVITAMIN) capsule Take 1 capsule by mouth daily.    . psyllium (METAMUCIL) 58.6 % powder Take 1 packet by mouth daily.     Marland Kitchen VITAMIN D, CHOLECALCIFEROL, PO Take 2 capsules by mouth daily.     No current facility-administered medications on file prior to visit.

## 2015-11-07 ENCOUNTER — Encounter: Payer: Self-pay | Admitting: Pulmonary Disease

## 2015-11-07 ENCOUNTER — Ambulatory Visit (INDEPENDENT_AMBULATORY_CARE_PROVIDER_SITE_OTHER): Payer: Medicare Other | Admitting: Pulmonary Disease

## 2015-11-07 VITALS — BP 122/74 | HR 67 | Temp 98.2°F | Wt 184.6 lb

## 2015-11-07 DIAGNOSIS — I255 Ischemic cardiomyopathy: Secondary | ICD-10-CM | POA: Diagnosis not present

## 2015-11-07 DIAGNOSIS — M109 Gout, unspecified: Secondary | ICD-10-CM

## 2015-11-07 DIAGNOSIS — J449 Chronic obstructive pulmonary disease, unspecified: Secondary | ICD-10-CM | POA: Diagnosis not present

## 2015-11-07 DIAGNOSIS — I10 Essential (primary) hypertension: Secondary | ICD-10-CM

## 2015-11-07 DIAGNOSIS — M150 Primary generalized (osteo)arthritis: Secondary | ICD-10-CM

## 2015-11-07 DIAGNOSIS — I2510 Atherosclerotic heart disease of native coronary artery without angina pectoris: Secondary | ICD-10-CM

## 2015-11-07 DIAGNOSIS — I739 Peripheral vascular disease, unspecified: Secondary | ICD-10-CM

## 2015-11-07 DIAGNOSIS — E7800 Pure hypercholesterolemia, unspecified: Secondary | ICD-10-CM

## 2015-11-07 DIAGNOSIS — L729 Follicular cyst of the skin and subcutaneous tissue, unspecified: Secondary | ICD-10-CM | POA: Diagnosis not present

## 2015-11-07 DIAGNOSIS — M159 Polyosteoarthritis, unspecified: Secondary | ICD-10-CM

## 2015-11-07 DIAGNOSIS — I779 Disorder of arteries and arterioles, unspecified: Secondary | ICD-10-CM

## 2015-11-07 DIAGNOSIS — H61899 Other specified disorders of external ear, unspecified ear: Secondary | ICD-10-CM

## 2015-11-07 DIAGNOSIS — H9390 Unspecified disorder of ear, unspecified ear: Secondary | ICD-10-CM

## 2015-11-07 DIAGNOSIS — Q181 Preauricular sinus and cyst: Secondary | ICD-10-CM

## 2015-11-07 NOTE — Patient Instructions (Signed)
Today we updated your med list in our EPIC system...    Continue your current medications the same...  Continue the Advair twice daily along w/ the Mucinex, fluids, etc...  We will arrange for an ENT evaluation of the cyst behind your ear...  Stay as active as possible...  Call for any questions...  Let's plan a follow up visit in 29mo, sooner if needed for problems.Marland KitchenMarland Kitchen

## 2015-11-07 NOTE — Progress Notes (Signed)
Subjective:    Patient ID: [name], male    DOB: 23-Jun-1931, 82 y.o.   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 he has been followed closely by [name] & [name] during the past years due to his cardiomyopathy & AICD...  ~  SEE PREV EPIC NOTES FOR THE EARLIER DATA >>  ~  June 08, 2013:  21moROV & [name] ret [place] inappropriate shocks from his AICD- checked by Cards (followed by HTylene Fantasia 7/14=> [name] oversensing & devise adjusted;  He had Treadmill test 7/14- no ischemic changes & no T wave oversensing but BP dropped therefore he had [name] as well 7/14 intermed risk w/ large posterolat infarct, no ischemia, EF=26% (worse) w/ diffuse HK of posterolat wall;  He's had some weight gain he attrib to smoking cessation, BNP=156;  He had f/u 2DEcho & curiously this revealed mild LVH, focal basal hypertrophy, norm LVF w/ EF=60-65% & no regional wall motion abn, mild LA&RA dil => he has f/u [name] soon... We reviewed the following medical problems during today's office visit >>     COPD> on Advair250Bid & Proair for prn use; doing better w/ smoking cessation...    HBP> on Coreg3.125Bid & Lisinopril5; BP= 102/60 & denies CP, palpit, dizzy/syncope, ch in SOB/DOE, edema...    ASHD/ Cardiomyopathy> on ASA 82md; improved w/ BBlocker/ ACE rx, not requiring diuretic and denies CP/ angina, etc; followed by [name]; Treadmill, [name], 2DEcho 7-8/14 reviewed & ?EF via Echo?    AICD w/ hx [name] oversensing 7/14 requiring AICD device adjustment by [name]...    Periph Vasc Dis> AAA repair 2000 by Jesse Bisonstill too sedentary & he declined cardiac rehab; s/p R CAE w/ DPA 3/12 by [name]; f/u CDoppler by [facility] 4/14 showed patent rightCAE site & <40% left ICA stenosis w/ calcif plaque; they plan regular follow up...    CHOL> on Simva40 + Fenofib160; FLP 8/14 shows TChol 122, TG 88, HDL 40, LDL 65; continue same...     GI> GERD, Divertics, Polyps,etc>  GI reported stable on incr Fiber intake; his CC= gas pains & we rec Mylicon, [name] etc...    DJD, osteoporosis, compression fx>  Vit D level is low at 19 & rec to take Calcium, Men's formula MVI, & he never tool the 50K Rx; Rec- OTC 2000u daily...    DERM> he saw Derm re: his rash (?seb dermatitis) and itching; labs show 22% eos & rec to take Antihist eg BeSharen HeckZyrkek daily... We reviewed prob list, meds, xrays and labs> see below for updates >>   LABS 8/14:  FLP- at goals on Simva40+Feno160;  Chems- wnl;  CBC- wnl x Plat=112;  TSH=2.42;  VitD=19;  PSA=1.66...  ~  May 31, 2014:  [place] sat[place], he missed 19mo103mo due to the weather in Feb & was doing well so he put it off til now;  No new complaints or concerns- he notes that his breathing is good, denies any signif cough, sput, SOB, etc; he's been active doing all the work around the house esp since wife is very limited in activity due to her COPD; he further denies CP, palpit, edema; his only complaint on ROS is some back pain after strenuous exercise & it resolves w/ a little rest... We reviewed the following medical problems during today's office visit >>     COPD> on  Advair250Bid & Proair for prn use; doing better w/ complete smoking cessation; PFTs show GOLD Stage3 COPD w/ FEV1=1.56 (47%); he is relatively asymptomatic & doesn't want to add additional meds.     HBP> on Coreg3.125Bid & Lisinopril5; BP= 128/64 & denies CP, palpit, dizzy/syncope, ch in SOB/DOE, edema...    ASHD/ Cardiomyopathy> on ASA 68m/d; improved w/ BBlocker/ ACE rx, not requiring diuretic and denies CP/ angina, etc; followed by [name]- Treadmill, [name], 2DEcho 7-8/14 reviewed & EF=26% by [name] & 60% by 2DEcho...     AICD w/ hx [name] oversensing 7/14 requiring AICD device adjustment by [name]...    Periph Vasc Dis> AAA repair 2000 by DSheryn [name] still too sedentary & he declined cardiac rehab; s/p R CAE  w/ DPA 3/12 by [name]; f/u CDoppler by [facility] 8/15 showed patent rightCAE site & <40% left ICA stenosis w/ calcif plaque; they plan yearly follow up...    CHOL> on Simva40 + Fenofib160; FLP 8/15 shows TChol 114, TG 139, HDL 33, LDL 53; continue same & incr exercise...    GI> GERD, Divertics, Polyps,etc>  GI reported stable on incr Fiber intake; prev gas pains improved w/ Mylicon, [name] etc...    DJD, osteoporosis, compression fx> on Calcium, Men's formula MVI & OTC VitD 2000u daily; Labs 8/15 showed Vit D level = 59; CXR w/ new part T10 compression=> needs f/u BMD & consider med rx...    DERM> he saw Derm re: his ?seb dermatitis and itching- improved on Rx; labs show 20% eos & rec to take Antihist eg BSharen [name] [name] daily... We reviewed prob list, meds, xrays and labs> see below for updates >>   CXR 8/15 showed Cardiomeg & AICD w/o change, clear lungs/ NAD, old right rib fxs & new T10 compression, osteopenia => needs repeat BMD & consideration of meds.  PFT 8/15 showed FVC=2.72 (61%), FEV1=1.56 (47%), %1sec=57, mid-flows=31% predicted; c/w GOLD Stage3 COPD...  LABS 8/15:  FLP- at goals on Simva40+Feno160 x HDL=33;  Chems- ok w/ Cr=1.3;  CBC- ok w/ Hg=13.9 but MCV=105, Plat=96K & Eos=20%;  TSH=2.26;  Uric=3.9 on Allopurinol;  VitD=59 on OTC supplement...  ADDENDUM>> [name] reviewed his 242DEcho & [name]>> he feels that EF is ~45%... ADDENDUM>> BMD 06/15/14 showed lowest Tscore -3.2 in right FSt. Francis [name] discussed w/ pt need for bone building Rx- start ALENDRONATE 754mwk... Called to [name] ~  May 06, 2015:  1182moV & [name] was seen by [name] 04/27/15 as an add-on for "head-cold" w/ drainage, cough, wheezing, incr SOB=> treated w/ ZPak, Pred taper, ch Advair to [name] overall improved; his CXR showed ?patchy LLL pneumonia and he notes less cough/ phlegm, improved SOB/ tightness, no f/c/s, but he is still too sedentary & not exercising;  He states that he doesn't notice any  diff on the [name] Advair & he says he'd prefer to use up the dulera & then ret to the Advair250Bid going forward;  We reviewed the benefit of staying on his meds regularly and increasing his exercise program... We reviewed the following medical problems during today's office visit >>     COPD> on Advair250Bid (vs Dulera200) & Proair prn; doing better w/ complete smoking cessation; PFTs 8/15 show GOLD Stage3 COPD w/ FEV1=1.56 (47%); he was treated for LLL pneumonia 7/16 w/ Zpak/ Pred & improvedl he remains stoic & resistant to regular meds/ exercise/ etc...     HBP> on Coreg3.125Bid & Lisinopril5; BP= 110/60 & denies CP, palpit, dizzy/syncope, ch in SOB/DOE, edema...Marland KitchenMarland Kitchen  ASHD/ Cardiomyopathy> on ASA '81mg'$ /d; improved w/ BBlocker/ ACE rx, not requiring diuretic and denies CP/ angina, etc; followed by [name]=> Treadmill, [name], 2DEcho 2014 reviewed & EF=26% by [name] & 60% by 2DEcho; last seen by [name] 6/16 &7 felt to be stable...    AICD w/ hx [name] oversensing 7/14 requiring AICD device adjustment by [name]; last seen 10/15- doing satis, no changes made, he continues telemonitoring monthly...    Periph Vasc Dis> AAA repair 2000 by Sheryn [name], still too sedentary & he declined cardiac rehab; s/p R CAE w/ DPA 3/12 by [name]; f/u CDoppler by [facility] 8/15 showed patent rightCAE site & <40% left ICA stenosis w/ calcif plaque; they plan yearly follow up...    CHOL> on Simva40 + off Fenofib per [name]; FLP 8/15 (on both) showed TChol 114, TG 139, HDL 33, LDL 53; he will ret for Fasting labs=> FLP 05/11/15 on Simva40 showed TChol 127, TG 140, HDL 42, LDL 57- continue same.    GI> GERD, Divertics, Polyps,etc>  GI reported stable on incr Fiber intake; prev gas pains improved w/ Mylicon, [name] etc...    DJD, osteoporosis, compression fx> on Allopurinol100, Men's formula MVI & VitD supplement; He is off Alendronate70/wk (?only took it for a short time after 8/15 BMD); Labs 8/15 showed Vit D level= 59;  CXR 8/15 w/ new part T10 compression=> BMD w/ Tscore +0.1 Spine (but has arthritis & scoliosis), and -3.2 right FemNeck=> Alendronate70 prescribed but pt didn't stay on this med.    DERM> he saw Derm re: his ?seb dermatitis and itching- improved on Rx; labs showed 20% eos & rec to take Antihist eg Sharen [name], [name] daily... EXAM reveals Afeb, VSS, O2sat=96% on RA;  HEENT- sl red, mallampati2;  Chest- mild end-exp rhonchi, no w/r/consolidation;  Heart- RR, gr1/6 SEM w/o r/g;  Abd- soft, neg;  Ext- w/o c/c/e...  CXR 04/27/15 showed mild cardiomeg, AICD w/o change, left basilar opac & ?sm effusion, osteopenia, mild compression fx w/o change...  CXR 05/06/15 showed borderline heart size, AICD, atherosclerotic calcif in arch; improved LLL opac w/ mild scarring left base, chronic lung dis w/ apic pleural scarring, old right rib fxs, etc...  LABS 7/16:  FLP- at goals on diet + Simva40;  Chems- wnl;  CBC- wnl (MCV=103);  BNP=148;  VitD=22 & rec to take OTC VitD supplement ~2000u daily 7 stay on this!   ~  November 07, 2015:  61moROV & JMarkeviusreports that he is stable overall but notes a "growth" cyst-like lesion behind the left pinna, ?some drainage & we will refer to ENT for drainage/ excision;  He notes breathing is stable, mild cough, sm amt clear sput, no wheezing, no f/c/s, etc...     He remains on Advair250 but he has cut himself down to 1/d; he takes "liquid mucinex" as needed...    Followed by [name] & KCaryl Comesw/ HBP, ASHD/cardiomyopathy, AICD w/ [name] oversensing in 2014; Currently taking:  ASA81, Coreg3.125Bid, Lisin5; he saw [name] 10/26/15- last Echo w/ EF=40-45% on 2011, the battery is EOL & they decided to leave the device alone & no replace it...    Known ASPVD w/ AAA repair2000 by DrLawson& right CAE 2012 by [name]; he saw [facility] 05/2015- CDoppler showed Rt CAE w/ signif hyperplasia & velocity in the 40% range; calcif plaque on left w/ <40% stenosis & f/u planned 113yr   [name] had prev  stopped the pt's Fenofibrate, and the Pt also has stopped his Simva40 on his own despite  recommendations to continue...    He has DJD, osteoporosis & compression fxs> he declined to stay on the Alendronate bone building therapy..,. EXAM reveals Afeb, VSS, O2sat=97% on RA;  HEENT- sl red, mallampati2;  Chest- mild end-exp rhonchi, no w/r/consolidation;  Heart- RR, gr1/6 SEM w/o r/g;  Abd- soft, neg;  Ext- w/o c/c/e... IMP/PLAN>>  [name] appears generally stable from his COPD, atherosclerotic dis, etc;  He has a cystic structure behind his left ear & we will set up an ENT eval for drainage...          Problem List:  COPD (PIR-518) - despite all efforts Burnett continues to smoke 3-4 cigars per day... he has min cough, some phlegm, but denies CP, SOB, wheezing, etc...  He doesn't want Chantix or help w/ smoking cessation;  He has a PROAIR inhaler for Prn use but he seldom uses it, & he knows to use the OTC MUCINEX 1-2 Bid w/ fluids for congestion. ~  CXR 3/12 in hosp for CAE showed COPD/E, biapical pleuroparenchymal scarring, NAD, AICD on left, old left rib fx, old T12 vertebroplasty... ~  Fall at home 4/12 w/ signif trauma & prob right rib fxs (CXR in ER showed some atelec but NAD & no rib films done). ~  1/14: presents w/ URI & acute on chr COPD exac; CXR in ER showed norm heart size, clear lungs, AICD in place, compression T12 w/ augmentation. ~  2/14: he responded nicely to Levaquin, Pred, Advair250, Mucinex, etc; asked to STAY on the ACZYSA630- Bid; he reports quitting the cigars! ~  8/14: on Advair250Bid & Proair for prn use; doing better w/ smoking cessation... ~  8/15:   on Advair250Bid & Proair for prn use; doing better w/ complete smoking cessation; PFTs show GOLD Stage3 COPD w/ FEV1=1.56 (47%); he is relatively asymptomatic & doesn't want to add additional meds. ~  CXR 8/15 showed Cardiomeg & AICD w/o change, clear lungs/ NAD, old right rib fxs & new T10 compression, osteopenia => needs repeat BMD  & consideration of meds. ~  PFT 8/15 showed FVC=2.72 (61%), FEV1=1.56 (47%), %1sec=57, mid-flows=31% predicted; c/w GOLD Stage3 COPD... ~  04/2015> presented w/ URI, bronchitis exac & LLL pneumonia;  treated w/ ZPak, Pred taper, ch Advair250 to Dulera200=> improved... ~  10/2015> he is back on Advair250 but only taking one inhalation/d; w/ his GOLD Stage 3 COPD he is advised to do it Bid...   HYPERTENSION (ICD-401.9) - back on COREG 3.125Bid & LISINOPRIL '5mg'$ Qhs... Prev had to wean off these due to dizziness & post hypotension (resolved off Etoh). ~  12/11:  BP= 126/70 and doing well> denies HA, fatigue, visual changes, CP, palipit, dizziness, dyspnea, edema, etc; he has had postural hypotension & several syncopal episodes related to this- now improved w/ the final adjustment in his meds. ~  4/12:  BP= 90/58 ==>100/60 recheck & he is weak; asked to monitor BP at home & may need to decr meds. ~  5/12:  BP= 130/70 supine & 100/60 sitting & standing (no symptoms at present)> he has been off the Lisinopril & Coreg for 1wk now. ~  7/12:  BP= 120/72 & 140/70 w/o postural changes (improved off etoh); he is back on Lisinopril '5mg'$  Qhs per Cards. ~  12/12:  BP= 128/74 on Coreg3.125Bid & Lisinopril5; denies CP, palpit, dizzy/syncope, ch in SOB/DOE, edema... ~  6/13:  BP= 122/68 & he remains largely asymptomatic... ~  1/14:  BP= 120/58 & he is here w/ acute on chr  COPD exac; denies CP, palpit, etc... ~  8/14: on Coreg3.125Bid & Lisinopril5; BP= 102/60 & denies CP, palpit, dizzy/syncope, ch in SOB/DOE, edema... ~  8/15: on Coreg3.125Bid & Lisinopril5; BP= 128/64 & denies CP, palpit, dizzy/syncope, ch in SOB/DOE, edema... ~  7/16: on Coreg3.125Bid & Lisinopril5; BP= 110/60 & he remains asymptomatic... 10/2015> on same meds and BP remains stable  ATHEROSCLEROTIC HEART DISEASE (ICD-414.00) - on ASA '81mg'$ /d + above meds... ISCHEMIC CARDIOMYOPATHY (ICD-414.8) Hx of SYNCOPE (ICD-780.2) & IMPLANTABLE DEFIBRILLATOR, DDD  MDT (ICD-V45.02) ~  Cath in 2003 w/ 2 vessel CAD and stent placed in LAD...  ~  Cardiolite 1/08 w/ large inferolat infarct, no ischemia, EF=36%...  ~  2DEcho 2/08 w/ infer & post HK, EF=40%...  ~  recath 6/09 w/ heavily calcif vessels & 40% EF- [name] has been following carefully and adjusting meds. ~  AICD placed 2009 by [name] for hx syncope & ischemic cardiomyopathy- followed by [name] yearly & doing satis. ~  2DEcho 4/10 showed mild LVH, mod reduced LVF w/ EF=40-45% w/ inferobasal & post HK, mild MR, paradoxical septal motion. ~  9/10: Cards tried to incr the Coreg to 9.'375mg'$ Bid but pt intol & went back to 6.25Bid. ~  8/11:  f/u by [name] w/ recent syncopal episode & meds adjusted Coreg 3.125Bid & Lisinopril 5Bid. ~  Serial XRays have all revealed signif atherosclerotic changes diffusely (eg- lumbar films 4/12, CT neck 4/12 as well). ~  4/12> ER visit for fall at home w/ signif trauma> ?post BP related, ?med related, ?other etiology > Cards eval & weaned off Coreg/ Lisinopril w/ resolution of postural hypotension. ~  7/12:  [name] restarted Lisin '5mg'$ Qhs, BP improved, no further postural changes, f/u 2DEcho w/ EF=35-40% & Gr1DD... ~  12/12:  [name] has him back on Coreg3.125Bid & Lisinopril5/d & stable overall... ~  7/13: he saw [name] w/ interrogation of his AICD showing some AFib episodes; he is considering anticoagulation but held off after discussion w/ [name]... ~  7-8/14: on ASA, Coreg, Lisin; improved w/ BBlocker/ ACE rx, not requiring diuretic and denies CP/ angina, etc; followed by [name]- Treadmill, [name], 2DEcho 7-8/14 reviewed & ?EF via Echo?  AICD w/ hx [name] oversensing 7/14 requiring AICD device adjustment by [name]...  HE CONTINUES TO f/u w/ [name] & [name] YEARLY... ~  [name] 7/14 showed large posterolat wall infarct w/o ischemia, EF=26%, diffuse HK in posterolat wall... ~  2DEcho 8/14 showed mild LVH, focal basal hypertrophy, norm LVF w/  EF=60-65%, norm wall motion, mild LA&RA dil, PAsys=36...  ~  ADDENDUM>> [name] reviewed his 17 2DEcho & [name]>> he feels that EF is ~45%... ~  He had f/u [name] 10/15> doing satis, no changes made, he continues telemonitoring monthly... ~  He had f/u [name] 6/16> denies symptoms, doing low-level bike exercise, exam unchanged, felt to be stable...  ~  He had f/u w/ [name] 11/16 & 1/17> they decided to leave the AICD in place & not replace the battery  CEREBROVASCULAR DISEASE PERIPHERAL VASCULAR DISEASE (ICD-443.9) - on ASA '81mg'$ /d...  ~  He is s/p AAA repair 2000 by DrLawson... he is sedentary and hasn't had ABI's checked... I will leave this to [place]... ~  CDopplers 7/10, 1/1,1 & 8/11 showed stable mod carotid dis bilat w/ heavy calcif plaque & 60-79% bilat ICA stenoses... ~  CDopplers 2/12 w/ worsening RICA velocities c/w 80-99% stenosis (stable 74-25% LICA stenosis);  He was evaluated by [facility] DrDickson & underwent a right CAE w/ DPA 9/56 without complications;  He has been doing satis post-op & they plan f/u CDoppler in 45mointervals going forward... ~  NOTE:  CT Brain 4/12 in ER showed mild to mod cortical vol loss & cbll atrophy, sm vessel dis, no acute changes... Prom vasc calcif seen on neck films & in abd... ~  CDopplers 4/13 by DrDickson showed patent right CAE site w/ mild plaque; 40-59% left ICA stenosis felt to be stable... ~  CDopplers 4/14 showed patent right CAE site w/ smooth plaque, and <40% left ICA stenosis but velocities are underest due to calcif plaque; felt to be stable... ~  8/15:  f/u CDoppler by [facility] 8/15 showed patent rightCAE site & <40% left ICA stenosis w/ calcif plaque; they plan yearly follow up. ~  he saw [facility] 05/2015- CDoppler showed Rt CAE w/ signif hyperplasia & velocity in the 40% range; calcif plaque on left w/ <40% stenosis & f/u planned 140yrHYPERCHOLESTEROLEMIA (ICD-272.0) - on SIMVASTATIN '40mg'$ /d & FENOFIBRATE 160/d. ~  FLP 4/08 showed TChol  131, TG 100, HDL 41, LDL 70 ~  FLP 2/09 showed TChol 124, TG 132, HDL 37, LDL 61 ~  FLP 12/10 > pt never ret for FLP & [name]~  FLW[place]2/11 showed TChol 112, TG 51, HDL 43, LDL 59 ~  4/12:  They report some difficult swallowing the Tricor capsule==> referred to GI for swallowing eval & switched to FENOFIBRATE '160mg'$ /d. ~  FLP 12/12 on Simva40+Feno160 showed TChol 124, TG 78, HDL 37, LDL 72 ~  FLP 8/14 on Simva40+Feno160 showed TChol 122, TG 88, HDL 40, LDL 65; continue same...  ~  FLMontegut/15 on Simva40+Feno160 showed TChol 114, TG 139, HDL 33, LDL 53 ~  [name] stopped his Fenofibrate, now on Simva40 + diet; FLP 05/11/15 on Simva40 showed TChol 127, TG 140, HDL 42, LDL 57- continue same. ~  He has since stopped the Simva40 on his own & declines to restart statin therapy...  GERD/ DYSPHAGIA> ~  4/12: pt noted some reflux symptoms & mild dysphagia for large capsule; Protonix '40mg'$ /d started & refer to GI for eval; they also note weak voice & may need ENT eval if not resolved in follow up... ~  5/12:  Pt states all symptoms resolved on their own, didn't take PPI, didn't see GI, denies swallowing or voice issues now.  DIVERTICULOSIS OF COLON (ICD-562.10) > he takes fiber supplement daily. COLONIC POLYPS (ICD-211.3) HEMORRHOIDS (ICD-455.6) - last colonoscopy was 11/06 by DrPatterson showing divertics, several 1-1m31molyps (hyperplastic), and hems...  Hx of LIVER FUNCTION TESTS, ABNORMAL (ICD-794.8) - improved off etoh... ~  labs 2/09 showed SGOT= 30, SGPT= 17 ~  labs 12/11 showed SGOT= 38, SGPT= 19 ~  Labs 12/12 off all etoh & LFTs all wnl... ~  LFTs have remained wnl...  DEGENERATIVE JOINT DISEASE (ICD-715.90) Hx of GOUT (ICD-274.9) - on ALLOPURINOL '100mg'$ /d... ~  Labs 2/09 showed Uric= 3.3 ~  Labs 8/15 on allopurinol '100mg'$ /d showed Uric = 3.9  OSTEOPOROSIS (ICD-733.00) - supposed to be on Caltrate, MVI, Vit D...  ~  he had T12 compression after syncopal spell  2009 w/ vertebroplasty by DrDeveshwar...   ~  BMD here 10/09 showed TScores +0.3 in Spine, & -2.7 in right FemNeck (Ortho eval by DrKVidal Schwalbe. ~  labs 12/11 showed Vit D level = 22... rec to take Men's MVI + Vit D 2000 u daily. ~  Labs 12/12 showed Vit D level = 17; REC> start regular dosing of Calcium, Men's  MVI, Vit D 5000u daily... ~  6/13: he never got the OTC Vit D so we will Rx w/ VitD 50K weekly Rx now... ~  8/15: on calcium, MVI, VitD 2000u daily w/ labs showing VitD level = 59 ~  CXR 8/15 showed new part compression T10=> will sched f/u BMD ~  BMD 06/15/14 showed lowest Tscore -3.2 in right FemNeck (spine was +0.1); discussed w/ pt need for bone building Rx- start ALENDRONATE '70mg'$ /wk... Called to [employer]. ~  Pt stopped the Alendronate on his own & declines to restart or try alternative rx; he is advised to continue Vits, VitD and be careful to avoid falls, etc...  DERMATITIS (ICD-692.9) - he has eosinophilia & saw Derm w/ rx for topical cream;  rec to take antihist as well...  Health Maintenance -  ~  GI: colonoscopy 11/06 w/ several hyperplastic polyps removed... ~  GU: PSA 12/12 = 1.50 ~  Immunizations:  he tells me that he had PNEUMOVAX & 2010 Flu shot 10/10... received TETANUS shot here 2003...   Past Surgical History  Procedure Laterality Date  . Abdominal aortic aneurysm repair  2002    by Dr. Kellie Simmering  . Cad stent  02/2002    Dr. Percival Spanish  . Aicd placed  03/2008    Dr. Caryl Comes  . Right corotid enderectomy  12/2010    Dr. Scot Dock  . Carotid endarterectomy Right January 02, 2011  . Cardiac defibrillator placement  2010    Outpatient Encounter Prescriptions as of 11/07/2015  Medication Sig  . ADVAIR DISKUS 250-50 MCG/DOSE AEPB Inhale 1 puff into the lungs 2 (two) times daily.   Marland Kitchen allopurinol (ZYLOPRIM) 100 MG tablet Take 100 mg by mouth daily.   Marland Kitchen aspirin 81 MG tablet Take 81 mg by mouth daily.  . carvedilol (COREG) 3.125 MG tablet Take 3.125 mg by mouth 2 (two) times  daily with a meal.   . lisinopril (PRINIVIL,ZESTRIL) 5 MG tablet Take 5 mg by mouth daily.   . Multiple Vitamin (MULTIVITAMIN) capsule Take 1 capsule by mouth daily.  . Multiple Vitamins-Minerals (PRESERVISION/LUTEIN) CAPS Take by mouth daily.  . psyllium (METAMUCIL) 58.6 % powder Take 1 packet by mouth daily.   Marland Kitchen tobramycin-dexamethasone (TOBRADEX) ophthalmic solution Twice daily  . VITAMIN D, CHOLECALCIFEROL, PO Take 2 capsules by mouth daily.  . [DISCONTINUED] levofloxacin (LEVAQUIN) 500 MG tablet Take 1 tablet (500 mg total) by mouth daily. (Patient not taking: Reported on 11/07/2015)  . [DISCONTINUED] predniSONE (STERAPRED UNI-PAK 48 TAB) 5 MG (48) TBPK tablet Take as directed (Patient not taking: Reported on 11/07/2015)   No facility-administered encounter medications on file as of 11/07/2015.    No Known Allergies   Current Medications, Allergies, Past Medical History, Past Surgical History, Family History, and Social History were reviewed in [employer] record.    Review of Systems         See HPI - all other systems neg except as noted... The patient complains of dyspnea on exertion, prolonged cough, and difficulty walking.  The patient denies anorexia, fever, weight loss, weight gain, vision loss, decreased hearing, hoarseness, chest pain, syncope, peripheral edema, headaches, hemoptysis, abdominal pain, melena, hematochezia, severe indigestion/heartburn, hematuria, incontinence, muscle weakness, suspicious skin lesions, transient blindness, depression, unusual weight change, abnormal bleeding, enlarged lymph nodes, and angioedema.     Objective:   Physical Exam      WD, WN, 82 y/o WM > chr ill appearing but NAD... GENERAL:  Alert & oriented; pleasant &  cooperative... HEENT:  [place]/AT, EOM-full, PERRLA, EACs-clear  NOSE-clear, THROAT-clear & wnl, Voice sounds back to norm NECK:  Supple w/ fairROM; no JVD; prominent carotid impulses, scar on right, + bruits;  no thyromegaly or nodules palpated; no lymphadenopathy... CHEST:  bilat rhonchi at bases & min end-exp wheezing,  no rales, no signs of consolidation... HEART:  Regular Rhythm; gr 1/6 SEM, S4, no rubs... ABDOMEN:  Soft, non-tender, normal bowel sounds; no organomegaly or masses detected. EXT: without deformities, mild arthritic changes; no varicose veins/ +venous insuffic/ no edema. NEURO: no focal neuro deficits... DERM:  dry skin dermatitis, seborrhea, rosacea...  RADIOLOGY DATA:  Reviewed in the EPIC EMR & discussed w/ the patient...  LABORATORY DATA:  Reviewed in the EPIC EMR & discussed w/ the patient...   Assessment & Plan:    GOLD stage3 COPD>  he is congratulated on quitting smoking completely & remaining off cigs; Advised to stay on the ICS/LABA (Advair Bid) regularly, he does not want additional meds...    Hx HBP & Hx Postural Hypotension>  BP improved & tolerating Coreg 3.125Bid + Lisinopril '5mg'$  Qhs; no postural BP changes noted today & his dizziness has resolved; he notes all symptoms improved off etoh...  ASHD/ Cardiomyop/ AICD>  Followed by [facility] yearly;  Continue current meds & follow up...  Periph Vasc Dis>  Followed by [facility], DrDickson & CDoppler 8/15 showed patent right CAE site w/ <40% left ICA stenosis & they are following...  CHOL>  FLP looked good on Simva40 & Feno160, the latter was stopped by [name]; f/u FLP on Simva40=> at goals but he subsequently stopped the Simva on his own...  GI/ Dysphagia>  He notes symptoms resolved spont & he does not believe that he has a problem in this area, declines PPI Rx or GI eval...  LBP w/ compression fx T12 in 2009 w/ vertebroplasty>>  FALL 4/12 w/ signif trauma & right rib fxs>   OSTEOPOROSIS>  On calcium, MVI, VitD 2000u/d;  F/u BMD -3.2 in R FemNeck & Alendronate70/wk started 8/15 but pt didn't stick w/ this med & stopped on his own... New part compression T10 found 8/15 on routine CXR> he declined to restart  Alendronate or consider alternative therapy...  Other medical issues as noted...   Patient's Medications  New Prescriptions   No medications on file  Previous Medications   ADVAIR DISKUS 250-50 MCG/DOSE AEPB    Inhale 1 puff into the lungs 2 (two) times daily.    ALLOPURINOL (ZYLOPRIM) 100 MG TABLET    Take 100 mg by mouth daily.    ASPIRIN 81 MG TABLET    Take 81 mg by mouth daily.   CARVEDILOL (COREG) 3.125 MG TABLET    Take 3.125 mg by mouth 2 (two) times daily with a meal.    LISINOPRIL (PRINIVIL,ZESTRIL) 5 MG TABLET    Take 5 mg by mouth daily.    MULTIPLE VITAMIN (MULTIVITAMIN) CAPSULE    Take 1 capsule by mouth daily.   MULTIPLE VITAMINS-MINERALS (PRESERVISION/LUTEIN) CAPS    Take by mouth daily.   PSYLLIUM (METAMUCIL) 58.6 % POWDER    Take 1 packet by mouth daily.    TOBRAMYCIN-DEXAMETHASONE (TOBRADEX) OPHTHALMIC SOLUTION    Twice daily   VITAMIN D, CHOLECALCIFEROL, PO    Take 2 capsules by mouth daily.  Modified Medications   No medications on file  Discontinued Medications   LEVOFLOXACIN (LEVAQUIN) 500 MG TABLET    Take 1 tablet (500 mg total) by mouth daily.  PREDNISONE (STERAPRED UNI-PAK 48 TAB) 5 MG (48) TBPK TABLET    Take as directed

## 2015-11-08 DIAGNOSIS — H04123 Dry eye syndrome of bilateral lacrimal glands: Secondary | ICD-10-CM | POA: Diagnosis not present

## 2015-11-08 DIAGNOSIS — H10501 Unspecified blepharoconjunctivitis, right eye: Secondary | ICD-10-CM | POA: Diagnosis not present

## 2015-11-08 DIAGNOSIS — H35351 Cystoid macular degeneration, right eye: Secondary | ICD-10-CM | POA: Diagnosis not present

## 2015-11-11 ENCOUNTER — Other Ambulatory Visit: Payer: Self-pay | Admitting: Pulmonary Disease

## 2015-11-11 DIAGNOSIS — H3581 Retinal edema: Secondary | ICD-10-CM | POA: Diagnosis not present

## 2015-11-11 DIAGNOSIS — H353132 Nonexudative age-related macular degeneration, bilateral, intermediate dry stage: Secondary | ICD-10-CM | POA: Diagnosis not present

## 2015-11-11 DIAGNOSIS — H43812 Vitreous degeneration, left eye: Secondary | ICD-10-CM | POA: Diagnosis not present

## 2015-11-11 DIAGNOSIS — H35373 Puckering of macula, bilateral: Secondary | ICD-10-CM | POA: Diagnosis not present

## 2015-11-15 DIAGNOSIS — H6192 Disorder of left external ear, unspecified: Secondary | ICD-10-CM | POA: Diagnosis not present

## 2015-11-22 ENCOUNTER — Other Ambulatory Visit: Payer: Self-pay | Admitting: Pulmonary Disease

## 2015-11-22 ENCOUNTER — Other Ambulatory Visit: Payer: Self-pay | Admitting: Otolaryngology

## 2015-11-22 DIAGNOSIS — D2322 Other benign neoplasm of skin of left ear and external auricular canal: Secondary | ICD-10-CM | POA: Diagnosis not present

## 2015-11-22 DIAGNOSIS — C44219 Basal cell carcinoma of skin of left ear and external auricular canal: Secondary | ICD-10-CM | POA: Diagnosis not present

## 2015-12-06 DIAGNOSIS — C44219 Basal cell carcinoma of skin of left ear and external auricular canal: Secondary | ICD-10-CM | POA: Diagnosis not present

## 2015-12-08 ENCOUNTER — Telehealth: Payer: Self-pay | Admitting: Internal Medicine

## 2015-12-08 ENCOUNTER — Telehealth: Payer: Self-pay | Admitting: Pulmonary Disease

## 2015-12-08 ENCOUNTER — Other Ambulatory Visit: Payer: Self-pay | Admitting: Otolaryngology

## 2015-12-08 NOTE — Telephone Encounter (Signed)
Attempted to contact [place] at [facility]. I was placed on a long hold. Will try back.

## 2015-12-08 NOTE — Telephone Encounter (Signed)
Per Dr. Caryl Comes- ok to hold ASA as needed for procedure. Will forward to triage to notify Dr. Janace Hoard office on 2/17.

## 2015-12-08 NOTE — Telephone Encounter (Signed)
Request for surgical clearance:  1. What type of surgery is being performed? 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 When is this surgery scheduled? 2/21  3. Are there any medications that need to be held prior to surgery and how long? aspirin   4. Name of physician performing surgery? Dr Janace Hoard  5. What is your office phone and fax number? [phone].

## 2015-12-09 NOTE — Telephone Encounter (Signed)
lmtcb X1 for Glenda on named vm

## 2015-12-09 NOTE — Telephone Encounter (Signed)
Lm for Cathie [name], Dr. Janace Hoard, medical assistant that Dr. Caryl Comes said was OK to hold ASA prior to procedure. Lm to call back if had any questions.

## 2015-12-11 ENCOUNTER — Other Ambulatory Visit: Payer: Self-pay | Admitting: Pulmonary Disease

## 2015-12-12 ENCOUNTER — Ambulatory Visit (INDEPENDENT_AMBULATORY_CARE_PROVIDER_SITE_OTHER): Payer: Medicare Other | Admitting: Internal Medicine

## 2015-12-12 ENCOUNTER — Encounter: Payer: Self-pay | Admitting: Internal Medicine

## 2015-12-12 ENCOUNTER — Encounter (HOSPITAL_COMMUNITY)
Admission: RE | Admit: 2015-12-12 | Discharge: 2015-12-12 | Disposition: A | Discharge status: Home / Self Care | Payer: Medicare Other | Source: Ambulatory Visit | Attending: Otolaryngology | Admitting: Otolaryngology

## 2015-12-12 ENCOUNTER — Encounter (HOSPITAL_COMMUNITY): Payer: Self-pay

## 2015-12-12 VITALS — BP 102/64 | HR 68 | Ht 71.0 in | Wt 189.4 lb

## 2015-12-12 DIAGNOSIS — Z79899 Other long term (current) drug therapy: Secondary | ICD-10-CM | POA: Diagnosis not present

## 2015-12-12 DIAGNOSIS — Z7982 Long term (current) use of aspirin: Secondary | ICD-10-CM | POA: Diagnosis not present

## 2015-12-12 DIAGNOSIS — Z87891 Personal history of nicotine dependence: Secondary | ICD-10-CM | POA: Diagnosis not present

## 2015-12-12 DIAGNOSIS — J449 Chronic obstructive pulmonary disease, unspecified: Secondary | ICD-10-CM

## 2015-12-12 DIAGNOSIS — Z955 Presence of coronary angioplasty implant and graft: Secondary | ICD-10-CM | POA: Diagnosis not present

## 2015-12-12 DIAGNOSIS — I2510 Atherosclerotic heart disease of native coronary artery without angina pectoris: Secondary | ICD-10-CM | POA: Diagnosis not present

## 2015-12-12 DIAGNOSIS — I252 Old myocardial infarction: Secondary | ICD-10-CM | POA: Diagnosis not present

## 2015-12-12 DIAGNOSIS — I10 Essential (primary) hypertension: Secondary | ICD-10-CM | POA: Diagnosis not present

## 2015-12-12 DIAGNOSIS — I739 Peripheral vascular disease, unspecified: Secondary | ICD-10-CM | POA: Diagnosis not present

## 2015-12-12 DIAGNOSIS — C44212 Basal cell carcinoma of skin of right ear and external auricular canal: Secondary | ICD-10-CM | POA: Diagnosis not present

## 2015-12-12 DIAGNOSIS — E7800 Pure hypercholesterolemia, unspecified: Secondary | ICD-10-CM | POA: Diagnosis not present

## 2015-12-12 DIAGNOSIS — Z85828 Personal history of other malignant neoplasm of skin: Secondary | ICD-10-CM | POA: Diagnosis not present

## 2015-12-12 DIAGNOSIS — I255 Ischemic cardiomyopathy: Secondary | ICD-10-CM

## 2015-12-12 DIAGNOSIS — M1990 Unspecified osteoarthritis, unspecified site: Secondary | ICD-10-CM | POA: Diagnosis not present

## 2015-12-12 DIAGNOSIS — M109 Gout, unspecified: Secondary | ICD-10-CM | POA: Diagnosis not present

## 2015-12-12 DIAGNOSIS — Z95810 Presence of automatic (implantable) cardiac defibrillator: Secondary | ICD-10-CM | POA: Diagnosis not present

## 2015-12-12 HISTORY — DX: Unspecified malignant neoplasm of skin, unspecified: C44.90

## 2015-12-12 HISTORY — DX: Presence of automatic (implantable) cardiac defibrillator: Z95.810

## 2015-12-12 LAB — BASIC METABOLIC PANEL
ANION GAP: 9 (ref 5–15)
BUN: 8 mg/dL (ref 6–20)
CHLORIDE: 102 mmol/L (ref 101–111)
CO2: 24 mmol/L (ref 22–32)
Calcium: 9.5 mg/dL (ref 8.9–10.3)
Creatinine, Ser: 0.86 mg/dL (ref 0.61–1.24)
Glucose, Bld: 86 mg/dL (ref 65–99)
POTASSIUM: 4.3 mmol/L (ref 3.5–5.1)
SODIUM: 135 mmol/L (ref 135–145)

## 2015-12-12 LAB — CBC
HCT: 41.3 % (ref 39.0–52.0)
Hemoglobin: 14.3 g/dL (ref 13.0–17.0)
MCH: 34.4 pg — ABNORMAL HIGH (ref 26.0–34.0)
MCHC: 34.6 g/dL (ref 30.0–36.0)
MCV: 99.3 fL (ref 78.0–100.0)
PLATELETS: 82 10*3/uL — AB (ref 150–400)
RBC: 4.16 MIL/uL — AB (ref 4.22–5.81)
RDW: 12.7 % (ref 11.5–15.5)
WBC: 7.4 10*3/uL (ref 4.0–10.5)

## 2015-12-12 NOTE — Telephone Encounter (Signed)
SN is out of office all week. Pt was seen by SN on 11-07-15 and was referred to ENT. Please advise MW if you are willing to give surgical clearance on pt? thanks

## 2015-12-12 NOTE — Telephone Encounter (Signed)
Called and spoke with the patient. I explained to him that he will need an ov for the surgical clearance with MW in SN's absence. Pt stated that he has a pre-op visit at 3pm today and the surgery is tomorrow. I informed him that I would contact Dr. Janace Hoard office to discuss appointment. He voiced understanding and had no further questions.  Called Dr. Peggyann Juba office and spoke with a nurse. She states that pre-op can be moved and that she was sending a message to [facility] to try and get the pre-op visit moved to this morning so that the patient can have his surgery tomorrow. She also said that she would call the patient back and inform him of the ov with MW today at 2:45pm. She stated if she has any further questions she will call our office. OV was scheduled. Nothing further is needed.

## 2015-12-12 NOTE — Assessment & Plan Note (Addendum)
Quit smoking 2014 Spirometry   FEV1  1.56 (47%) ratio 67  - 04/27/2015 p extensive coaching HFA effectiveness =    75%   Well compensated s ab component on advair 250/50 bid and rare need for saba   I had an extended discussion with the patient reviewing all relevant studies completed to date and  lasting 35 minutes of a 60 minute pre-op eval  No contraindication to Ear surgery/ gen anesthesia though note advair/acei potentially can destabilize the upper airway with ET planned.     Each maintenance medication was reviewed in detail including most importantly the difference between maintenance and prns and under what circumstances the prns are to be triggered using an action plan format that is not reflected in the computer generated alphabetically organized AVS.    Please see instructions for details which were reviewed in writing and the patient given a copy highlighting the part that I personally wrote and discussed at today's ov.   CLEARED FOR SURGERY

## 2015-12-12 NOTE — Progress Notes (Signed)
PCP is Teressa Lower  Cardiologist is [name]. Nurse faxed perioperative device programming sheet to Dr. Olin Pia office, as patient has a ICD. Awaiting fax.  Patient denied having any acute cardiac or pulmonary issues  Nurse called Ebony Hail, [place] and informed her of patients history, and that patient had a scheduled appointment to be seen by Pulmonologist today for surgical clearance.  Will give chart to primary Nurse Olen Cordial) to follow up with lab results from PAT visit.

## 2015-12-12 NOTE — Pre-Procedure Instructions (Signed)
[name]  12/12/2015     Your procedure is scheduled on: Tuesday December 13, 2015 at 9:30 AM.  Report to [facility] Admitting at 7:30 AM.  Call this number if you have problems the morning of surgery: [phone]    Remember:  Do not eat food or drink liquids after midnight.  Take these medicines the morning of surgery with A SIP OF WATER : Advair inhaler, Allopurinol (Zyloprim), Carvedilol (Coreg)   Stop taking any vitamins,herbal medications/supplements, Ibuprofen, Advil, Motrin, Aleve, etc   Do not wear jewelry.  Do not wear lotions, powders, or cologne.    Men may shave face and neck.  Do not bring valuables to the hospital.  [facility] is not responsible for any belongings or valuables.  Contacts, dentures or bridgework may not be worn into surgery.  Leave your suitcase in the car.  After surgery it may be brought to your room.  For patients admitted to the hospital, discharge time will be determined by your treatment team.  Patients discharged the day of surgery will not be allowed to drive home.   Name and phone number of your driver:    Special instructions:  Shower using CHG soap the night before and the morning of your surgery  Please read over the following fact sheets that you were given. Pain Booklet, Coughing and Deep Breathing and Surgical Site Infection Prevention

## 2015-12-12 NOTE — Progress Notes (Addendum)
Subjective:    Patient ID: [name], male    DOB: 01/24/1931, 83 y.o.   MRN: [id number]  HPI 83 y/o WM here for a follow up visit... he has been followed closely by DrHochrein & DrKlein during the past years due to his cardiomyopathy & AICD...  ~  SEE PREV EPIC NOTES FOR THE EARLIER DATA >>  ~  October 30, 2012:  6mo ROV & [place]ax presents w/ a 2wk hx URI starting w/ low grade temp, chills, & feeling cold/ feeling bad; then developed cough w/ thick yellow phlegm and chest congestion- no hemoptysis, no CP x sore from coughing; he tried OTC meds w/o benefit & progressed w/ incr SOB prompting him to call EMS & go to the ER 1/7;  ER eval reviewed: congested w/ rhonchi on exam; T99.2 RR=21 O2sat=93%; CXR was clear & CBC- ok w/ WBC=10.4; treated w/ O2, NEB, IV fluid and improved...     Today his exam shows mod cough, exp wheezing & rhonchi, no consolidation, thick yellow sput, etc; We discussed Acute on Chronic COPD and treatment w/ Levaquin, Depo80, Prednisone taper, Advair250, Mucinex w/ Fluids, Hycodan as needed etc... We will plan ROV recheck in 1 mo.    We reviewed prob list, meds, xrays and labs> see below for updates >>   ~  December 09, 2012:  6wk ROV & recheck> [name] responded nicely to our Rx w/ Pred, Levaquin, Advair250, Mucinex, etc;  He has since stopped all the meds including the Advair- we discussed this & the reason for the long term use of the Advair250- one inhalation Bid & he agrees to continue; plus the Mucinex prn for thick phlegm & Proair as needed...    We reviewed his other medical issues today- BP controlled on meds; Cardiac stable & he gets pacer checks w/ DrKlein; Cerebrovasc f/u DrDickson yearly for Dopplers due this spring; needs to ret fasting for labs...  ~  June 08, 2013:  6mo ROV & [name] recently ret from a trip to [place] & had several inappropriate shocks from his AICD- checked by Cards (followed by Hochrein & Klein) 7/14=> Twave oversensing & devise adjusted;  He had  Treadmill test 7/14- no ischemic changes & no T wave oversensing but BP dropped therefore he had Myoview as well 7/14 intermed risk w/ large posterolat infarct, no ischemia, EF=26% (worse) w/ diffuse HK of posterolat wall;  He's had some weight gain he attrib to smoking cessation, BNP=156;  He had f/u 2DEcho & curiously this revealed mild LVH, focal basal hypertrophy, norm LVF w/ EF=60-65% & no regional wall motion abn, mild LA&RA dil => he has f/u DrHochrein soon... We reviewed the following medical problems during today's office visit >>     COPD> on Advair250Bid & Proair for prn use; doing better w/ smoking cessation...    HBP> on Coreg3.125Bid & Lisinopril5; BP= 102/60 & denies CP, palpit, dizzy/syncope, ch in SOB/DOE, edema...    ASHD/ Cardiomyopathy> on ASA 81mg/d; improved w/ BBlocker/ ACE rx, not requiring diuretic and denies CP/ angina, etc; followed by DrHochrein; Treadmill, Myoview, 2DEcho 7-8/14 reviewed & ?EF via Echo?    AICD w/ hx Twave oversensing 7/14 requiring AICD device adjustment by DrKlein...    Periph Vasc Dis> AAA repair 2000 by DrLawson, still too sedentary & he declined cardiac rehab; s/p R CAE w/ DPA 3/12 by DrCDickson; f/u CDoppler by [facility] 4/14 showed patent rightCAE site & <40% left ICA stenosis w/ calcif plaque; they plan regular follow up...      CHOL> on Simva40 + Fenofib160; FLP 8/14 shows TChol 122, TG 88, HDL 40, LDL 65; continue same...    GI> GERD, Divertics, Polyps,etc>  GI reported stable on incr Fiber intake; his CC= gas pains & we rec Mylicon, Phazyme etc...    DJD, osteoporosis, compression fx>  Vit D level is low at 19 & rec to take Calcium, Men's formula MVI, & he never tool the 50K Rx; Rec- OTC 2000u daily...    DERM> he saw Derm re: his rash (?seb dermatitis) and itching; labs show 22% eos & rec to take Antihist eg Benedryl, Allegra, Zyrkek daily... We reviewed prob list, meds, xrays and labs> see below for updates >>   LABS 8/14:  FLP- at goals on  Simva40+Feno160;  Chems- wnl;  CBC- wnl x Plat=112;  TSH=2.42;  VitD=19;  PSA=1.66...  ~  May 31, 2014:  1yr ROV & [name] reports doing satis, he missed 6mo ROV due to the weather in Feb & was doing well so he put it off til now;  No new complaints or concerns- he notes that his breathing is good, denies any signif cough, sput, SOB, etc; he's been active doing all the work around the house esp since wife is very limited in activity due to her COPD; he further denies CP, palpit, edema; his only complaint on ROS is some back pain after strenuous exercise & it resolves w/ a little rest... We reviewed the following medical problems during today's office visit >>     COPD> on Advair250Bid & Proair for prn use; doing better w/ complete smoking cessation; PFTs show GOLD Stage3 COPD w/ FEV1=1.56 (47%); he is relatively asymptomatic & doesn't want to add additional meds.     HBP> on Coreg3.125Bid & Lisinopril5; BP= 128/64 & denies CP, palpit, dizzy/syncope, ch in SOB/DOE, edema...    ASHD/ Cardiomyopathy> on ASA 81mg/d; improved w/ BBlocker/ ACE rx, not requiring diuretic and denies CP/ angina, etc; followed by DrHochrein- Treadmill, Myoview, 2DEcho 7-8/14 reviewed & EF=26% by Myoview & 60% by 2DEcho...     AICD w/ hx Twave oversensing 7/14 requiring AICD device adjustment by DrKlein...    Periph Vasc Dis> AAA repair 2000 by DrLawson, still too sedentary & he declined cardiac rehab; s/p R CAE w/ DPA 3/12 by DrCDickson; f/u CDoppler by [facility] 8/15 showed patent rightCAE site & <40% left ICA stenosis w/ calcif plaque; they plan yearly follow up...    CHOL> on Simva40 + Fenofib160; FLP 8/15 shows TChol 114, TG 139, HDL 33, LDL 53; continue same & incr exercise...    GI> GERD, Divertics, Polyps,etc>  GI reported stable on incr Fiber intake; prev gas pains improved w/ Mylicon, Phazyme etc...    DJD, osteoporosis, compression fx> on Calcium, Men's formula MVI & OTC VitD 2000u daily; Labs 8/15 showed Vit D level = 59; CXR  w/ new part T10 compression=> needs f/u BMD & consider med rx...    DERM> he saw Derm re: his ?seb dermatitis and itching- improved on Rx; labs show 20% eos & rec to take Antihist eg Benedryl, Allegra, Zyrkek daily... We reviewed prob list, meds, xrays and labs> see below for updates >>   CXR 8/15 showed Cardiomeg & AICD w/o change, clear lungs/ NAD, old right rib fxs & new T10 compression, osteopenia => needs repeat BMD & consideration of meds.  PFT 8/15 showed FVC=2.72 (61%), FEV1=1.56 (47%), %1sec=57, mid-flows=31% predicted; c/w GOLD Stage3 COPD...  LABS 8/15:  FLP- at goals on Simva40+Feno160 x HDL=33;    Chems- ok w/ Cr=1.3;  CBC- ok w/ Hg=13.9 but MCV=105, Plat=96K & Eos=20%;  TSH=2.26;  Uric=3.9 on Allopurinol;  VitD=59 on OTC supplement...  ADDENDUM>> DrHochrein reviewed his 91 2DEcho & Myoview>> he feels that EF is ~45%... ADDENDUM>> BMD 06/15/14 showed lowest Tscore -3.2 in right [facility]; discussed w/ pt need for bone building Rx- start ALENDRONATE '70mg'$ /wk... Called to [employer].   04/27/2015 acute  ov/Bettyann Birchler re:  Cough [name] Chief Complaint  Patient presents with  . Acute Visit    Pt c/o "head cold"- started 6 days ago and now has moved to chest. He c/o prod cough with brown to yellow sputum, wheezing, and chills.     no sob or rigors or n or v.  maint on adviar and acei/ coreg with chronic cough/ raspy throat but really Not limited by breathing from desired activities  rec Stop advair Start dulera 200 Take 2 puffs first thing in am and then another 2 puffs about 12 hours later.  Work on inhaler technique:  relax and gently blow all the way out then take a nice smooth deep breath back in, triggering the inhaler at same time you start breathing in.  Hold for up to 5 seconds if you can.  Rinse and gargle with water when done Zpak  Prednisone 10 mg take  4 each am x 2 days,   2 each am x 2 days,  1 each am x 2 days and stop  Please remember to go to the lab and x-ray department  downstairs for your tests - we will call you with the results when they are available.   12/12/2015  Office consultation [name] re: GOLD III pre-op eval/clearance  Chief Complaint  Patient presents with  . Surgery Clearance    Pt of Dr Lenna Gilford here for clearance for removal of basal cell CA behind left ear. Pt states his breathing is doing well. No co's today.     Walking dog 10-15 min daily, no hills - no sob  Walking at a nl pace  No obvious day to day or daytime variabilty or assoc excess/ purulent sputum or mucus plugs  cp or chest tightness, or overt   hb symptoms. No unusual exp hx or h/o childhood pna/ asthma or knowledge of premature birth.  Sleeping ok without nocturnal  or early am exacerbation  of respiratory  c/o's or need for noct saba. Also denies any obvious fluctuation of symptoms with weather or environmental changes or other aggravating or alleviating factors except as outlined above   Current Medications, Allergies, Complete Past Medical History, Past Surgical History, Family History, and Social History were reviewed in [employer] record.  ROS  The following are not active complaints unless bolded sore throat, dysphagia, dental problems, itching, sneezing,  nasal congestion or excess/ purulent secretions, ear ache,   fever, chills, sweats, unintended wt loss, pleuritic or exertional cp, hemoptysis,  orthopnea pnd or leg swelling, presyncope, palpitations, abdominal pain, anorexia, nausea, vomiting, diarrhea  or change in bowel or urinary habits, change in stools or urine, dysuria,hematuria,  rash, arthralgias, visual complaints, headache, numbness weakness or ataxia or problems with walking or coordination,  change in mood/affect or memory.                 Problem List:  COPD (ATF-573) - despite all efforts Emry continues to smoke 3-4 cigars per day... he has min cough, some phlegm, but denies CP, SOB, wheezing, etc...  He doesn't want Chantix  or  help w/ smoking cessation;  He has a PROAIR inhaler for Prn use but he seldom uses it, & he knows to use the OTC MUCINEX 1-2 Bid w/ fluids for congestion. ~  CXR 3/12 in hosp for CAE showed COPD/E, biapical pleuroparenchymal scarring, NAD, AICD on left, old left rib fx, old T12 vertebroplasty... ~  Fall at home 4/12 w/ signif trauma & prob right rib fxs (CXR in ER showed some atelec but NAD & no rib films done). ~  1/14: presents w/ URI & acute on chr COPD exac; CXR in ER showed norm heart size, clear lungs, AICD in place, compression T12 w/ augmentation. ~  2/14: he responded nicely to Levaquin, Pred, Advair250, Mucinex, etc; asked to STAY on the [id number]- Bid; he reports quitting the cigars! ~  8/14: on Advair250Bid & Proair for prn use; doing better w/ smoking cessation... ~  8/15:   on Advair250Bid & Proair for prn use; doing better w/ complete smoking cessation; PFTs show GOLD Stage3 COPD w/ FEV1=1.56 (47%); he is relatively asymptomatic & doesn't want to add additional meds. ~  CXR 8/15 showed Cardiomeg & AICD w/o change, clear lungs/ NAD, old right rib fxs & new T10 compression, osteopenia => needs repeat BMD & consideration of meds. ~  PFT 8/15 showed FVC=2.72 (61%), FEV1=1.56 (47%), %1sec=57, mid-flows=31% predicted; c/w GOLD Stage3 COPD...   HYPERTENSION (ICD-401.9) - back on COREG 3.125Bid & LISINOPRIL '5mg'$ Qhs... Prev had to wean off these due to dizziness & post hypotension (resolved off Etoh). ~  12/11:  BP= 126/70 and doing well> denies HA, fatigue, visual changes, CP, palipit, dizziness, dyspnea, edema, etc; he has had postural hypotension & several syncopal episodes related to this- now improved w/ the final adjustment in his meds. ~  4/12:  BP= 90/58 ==>100/60 recheck & he is weak; asked to monitor BP at home & may need to decr meds. ~  5/12:  BP= 130/70 supine & 100/60 sitting & standing (no symptoms at present)> he has been off the Lisinopril & Coreg for 1wk now. ~  7/12:  BP=  120/72 & 140/70 w/o postural changes (improved off etoh); he is back on Lisinopril '5mg'$  Qhs per Cards. ~  12/12:  BP= 128/74 on Coreg3.125Bid & Lisinopril5; denies CP, palpit, dizzy/syncope, ch in SOB/DOE, edema... ~  6/13:  BP= 122/68 & he remains largely asymptomatic... ~  1/14:  BP= 120/58 & he is here w/ acute on chr COPD exac; denies CP, palpit, etc... ~  8/14:  on Coreg3.125Bid & Lisinopril5; BP= 102/60 & denies CP, palpit, dizzy/syncope, ch in SOB/DOE, edema... ~  8/15:  on Coreg3.125Bid & Lisinopril5; BP= 128/64 & denies CP, palpit, dizzy/syncope, ch in SOB/DOE, edema...  ATHEROSCLEROTIC HEART DISEASE (ICD-414.00) - on ASA '81mg'$ /d + above meds... ISCHEMIC CARDIOMYOPATHY (ICD-414.8) Hx of SYNCOPE (ICD-780.2) & IMPLANTABLE DEFIBRILLATOR, DDD MDT (ICD-V45.02) ~  Cath in 2003 w/ 2 vessel CAD and stent placed in LAD...  ~  Cardiolite 1/08 w/ large inferolat infarct, no ischemia, EF=36%...  ~  2DEcho 2/08 w/ infer & post HK, EF=40%...  ~  recath 6/09 w/ heavily calcif vessels & 40% EF- DrHochrein has been following carefully and adjusting meds. ~  AICD placed 2009 by DrKlein for hx syncope & ischemic cardiomyopathy- followed by DrKlein yearly & doing satis. ~  2DEcho 4/10 showed mild LVH, mod reduced LVF w/ EF=40-45% w/ inferobasal & post HK, mild MR, paradoxical septal motion. ~  9/10: Cards tried to incr the Coreg to 9.'375mg'$ Bid  but pt intol & went back to 6.25Bid. ~  8/11:  f/u by DrHochrein w/ recent syncopal episode & meds adjusted Coreg 3.125Bid & Lisinopril 5Bid. ~  Serial XRays have all revealed signif atherosclerotic changes diffusely (eg- lumbar films 4/12, CT neck 4/12 as well). ~  4/12> ER visit for fall at home w/ signif trauma> ?post BP related, ?med related, ?other etiology > Cards eval & weaned off Coreg/ Lisinopril w/ resolution of postural hypotension. ~  7/12:  DrKlein restarted Lisin '5mg'$ Qhs, BP improved, no further postural changes, f/u 2DEcho w/ EF=35-40% & Gr1DD... ~  12/12:   DrHochrein has him back on Coreg3.125Bid & Lisinopril5/d & stable overall... ~  7/13: he saw DrKlein w/ interrogation of his AICD showing some AFib episodes; he is considering anticoagulation but held off after discussion w/ DrHochrein... ~  7-8/14: on ASA, Coreg, Lisin; improved w/ BBlocker/ ACE rx, not requiring diuretic and denies CP/ angina, etc; followed by DrHochrein- Treadmill, Myoview, 2DEcho 7-8/14 reviewed & ?EF via Echo?  AICD w/ hx Twave oversensing 7/14 requiring AICD device adjustment by DrKlein...  HE CONTINUES TO f/u w/ DrKlein & DrHochrein YEARLY... ~  MYOVIEW 7/14 showed large posterolat wall infarct w/o ischemia, EF=26%, diffuse HK in posterolat wall... ~  2DEcho 8/14 showed mild LVH, focal basal hypertrophy, norm LVF w/ EF=60-65%, norm wall motion, mild LA&RA dil, PAsys=36...  ~  ADDENDUM>> DrHochrein reviewed his 74 2DEcho & Myoview>> he feels that EF is ~45%...  CEREBROVASCULAR DISEASE PERIPHERAL VASCULAR DISEASE (ICD-443.9) - on ASA '81mg'$ /d...  ~  He is s/p AAA repair 2000 by DrLawson... he is sedentary and hasn't had ABI's checked... I will leave this to [place]... ~  CDopplers 7/10, 1/1,1 & 8/11 showed stable mod carotid dis bilat w/ heavy calcif plaque & 60-79% bilat ICA stenoses... ~  CDopplers 2/12 w/ worsening RICA velocities c/w 80-99% stenosis (stable 93-23% LICA stenosis);  He was evaluated by [facility] DrDickson & underwent a right CAE w/ DPA 5/57 without complications;  He has been doing satis post-op & they plan f/u CDoppler in 18mointervals going forward... ~  NOTE:  CT Brain 4/12 in ER showed mild to mod cortical vol loss & cbll atrophy, sm vessel dis, no acute changes... Prom vasc calcif seen on neck films & in abd... ~  CDopplers 4/13 by DrDickson showed patent right CAE site w/ mild plaque; 40-59% left ICA stenosis felt to be stable... ~  CDopplers 4/14 showed patent right CAE site w/ smooth plaque, and <40% left ICA stenosis but velocities are underest due to  calcif plaque; felt to be stable... ~  8/15:  f/u CDoppler by [facility] 8/15 showed patent rightCAE site & <40% left ICA stenosis w/ calcif plaque; they plan yearly follow up.  HYPERCHOLESTEROLEMIA (ICD-272.0) - on SIMVASTATIN '40mg'$ /d & FENOFIBRATE 160/d. ~  FLP 4/08 showed TChol 131, TG 100, HDL 41, LDL 70 ~  FLP 2/09 showed TChol 124, TG 132, HDL 37, LDL 61 ~  FLP 12/10 > pt never ret for FLP & [facility] ~  FJefferson12/11 showed TChol 112, TG 51, HDL 43, LDL 59 ~  4/12:  They report some difficult swallowing the Tricor capsule==> referred to GI for swallowing eval & switched to FENOFIBRATE '160mg'$ /d. ~  FLP 12/12 on Simva40+Feno160 showed TChol 124, TG 78, HDL 37, LDL 72 ~  FLP 8/14 on Simva40+Feno160 showed TChol 122, TG 88, HDL 40, LDL 65; continue same...  ~  FBevier8/15 on Simva40+Feno160 showed  GERD/ DYSPHAGIA> ~  4/12: pt noted some reflux symptoms & mild dysphagia for large capsule; Protonix '40mg'$ /d started & refer to GI for eval; they also note weak voice & may need ENT eval if not resolved in follow up... ~  5/12:  Pt states all symptoms resolved on their own, didn't take PPI, didn't see GI, denies swallowing or voice issues now.  DIVERTICULOSIS OF COLON (ICD-562.10) > he takes fiber supplement daily. COLONIC POLYPS (ICD-211.3) HEMORRHOIDS (ICD-455.6) - last colonoscopy was 11/06 by DrPatterson showing divertics, several 1-51m polyps (hyperplastic), and hems...  Hx of LIVER FUNCTION TESTS, ABNORMAL (ICD-794.8) - improved off etoh... ~  labs 2/09 showed SGOT= 30, SGPT= 17 ~  labs 12/11 showed SGOT= 38, SGPT= 19 ~  Labs 12/12 off all etoh & LFTs all wnl... ~  LFTs have remained wnl...  DEGENERATIVE JOINT DISEASE (ICD-715.90) Hx of GOUT (ICD-274.9) - on ALLOPURINOL '100mg'$ /d... ~  Labs 2/09 showed Uric= 3.3 ~  Labs 8/15 on allopurinol '100mg'$ /d showed Uric = 3.9  OSTEOPOROSIS (ICD-733.00) - supposed to be on Caltrate, MVI, Vit D...  ~  he had T12 compression after  syncopal spell 2009 w/ vertebroplasty by DrDeveshwar...   ~  BMD here 10/09 showed TScores +0.3 in Spine, & -2.7 in right FemNeck (Ortho eval by DVidal Schwalbe... ~  labs 12/11 showed Vit D level = 22... rec to take Men's MVI + Vit D 2000 u daily. ~  Labs 12/12 showed Vit D level = 17; REC> start regular dosing of Calcium, Men's MVI, Vit D 5000u daily... ~  6/13: he never got the OTC Vit D so we will Rx w/ VitD 50K weekly Rx now... ~  8/15: on calcium, MVI, VitD 2000u daily w/ labs showing VitD level = 59 ~  CXR 8/15 showed new part compression T10=> will sched f/u BMD ~  BMD 06/15/14 showed lowest Tscore -3.2 in right FemNeck (spine was +0.1); discussed w/ pt need for bone building Rx- start ALENDRONATE '70mg'$ /wk... Called to [facility]  DERMATITIS (ICD-692.9) - he has eosinophilia & saw Derm w/ rx for topical cream;  rec to take antihist as well...  Health Maintenance -  ~  GI: colonoscopy 11/06 w/ several hyperplastic polyps removed... ~  GU: PSA 12/12 = 1.50 ~  Immunizations:  he tells me that he had PNEUMOVAX & 2010 Flu shot 10/10... received TETANUS shot here 2003...  Objective:   Physical Exam      WD, WN, 82y/o WM  nad   12/12/2015       189    04/27/15 185 lb (83.915 kg)  02/24/15 193 lb 9.6 oz (87.816 kg)  08/17/14 189 lb (85.73 kg)    Vital signs reviewed    GENERAL:  Alert & oriented; pleasant & cooperative... HEENT:  [place]/AT, EOM-full, PERRLA, EACs-clear  NOSE-clear, THROAT-clear & wnl, Voice sounds back to norm NECK:  Supple w/ fairROM; no JVD; prominent carotid impulses, scar on right, + bruits; no thyromegaly or nodules palpated; no lymphadenopathy... CHEST  Clear to A and P -   no rales, no signs of consolidation... HEART:  Regular Rhythm; gr 1/6 SEM, S4, no rubs... ABDOMEN:  Min Bruise in right flank, mild tender lower ribs; normal bowel sounds; no organomegaly or masses detected. EXT: without deformities, mild arthritic changes; no varicose veins/ +venous insuffic/  no edema. NEURO: no focal neuro deficits... DERM:  dry skin dermatitis, seborrhea, rosacea...     I personally reviewed images and agree with radiology impression as follows:  CXR:  05/06/15 Clearing left lower lobe pneumonia.      Assessment & Plan:

## 2015-12-12 NOTE — Progress Notes (Signed)
Anesthesia Chart Review: Patient is a 82 year old male scheduled for left ear lesion excision with possible skin graft on 12/13/15 by Dr. Janace Hoard. He was a work-in for a PAT visit early afternoon because he is scheduled for a pulmonology pre-operative evaluation by Dr. Melvyn Novas this afternoon at 3 PM.  History includes CAD/MI s/p LAD stent '03, ischemic cardiomyopathy, syncope s/p ICD '09 (with shock in 03/2013 for T wave oversensing), former smoker (quit 2014), COPD, HTN, hypercholesterolemia, AAA repair '02, right CEA '12.    Primary cardiologist is Dr. Percival Spanish, last visit 02/24/15. No change in therapy and no further imaging felt indicated at that time.  Last EP visit with Dr. Caryl Comes was on 10/26/15. Per Holley Raring with Dr. Peggyann Juba, she has spoken with Dr. Caryl Comes about surgery plans and was given permission for patient to hold ASA prior to surgery. No additional testing ordered. His 10/26/15 note states, "We have had a lengthy discussion regarding end of life and his ICD. We have decided to neither change out or explant his current pulsegenerator based on attenuated benefit of ICD with age and interval resolution (5/14) of LV dysfunction  We have inactivated all alerts Therapies have been left active We will see as needed."  12/12/15 pulmonology note by Dr. Melvyn Novas is still pending, but according to Patient Instructions (Encounters Tab): "Your advair and lisinopril will need to be monitored as it's possible for them to cause problems with coughing fits but for now they seem to be ok  You are cleared for surgery and we can see you if needed post operatively."    Meds include Advair, allopurinol, ASA, Coreg, lisinopril, Zocor.  10/26/15 EKG: NSR, possible LAE, right BBB, inferior infarct (age undetermined), T wave abnormality, consider lateral ischemia.  06/04/13 Echo: Study Conclusions - Left ventricle: Wall thickness was increased in a pattern of mild LVH. There was focal basal hypertrophy. Systolic function was  normal. The estimated ejection fraction was in the range of 60% to 65%. Wall motion was normal; there were no regional wall motion abnormalities. There was an increased relative contribution of atrial contraction to ventricular filling. - Left atrium: The atrium was mildly dilated. - Right atrium: The atrium was mildly dilated. - Pulmonary arteries: PA peak pressure: 64mm Hg (S).  05/20/13 Nuclear stress test: Overall Impression:Intermediate risk stress nuclear study Large posterolateral wall infarct at mid and basal level with no signficant ischemia. LV Ejection Fraction: 26%. LV Wall Motion: Diffuse hypokinesis worse in the posterolateal wall.  (According to 05/26/13 cardiology note by Richardson Dopp, PA-C, patient's  Myoview was felt similar to prior study. Echo was ordered to confirm his EF, and actually showed a normal LVEF of 60-65% No additional testing recommended.)  03/30/08 Cardiac cath: RESULTS: Hemodynamics RA mean 3, RV 29/7, PA 28/10 with a mean of 17,pulmonary capillary wedge pressure mean 12, AO 156/71, LV 154/12.Cardiac output/cardiac index 3.7/1.8. Coronaries left main was heavily calcified. The LAD had proximal heavy calcification. There was ostiallong 25% stenosis. The proximal stent with mild in-stent luminalirregularities. The mid to distal vessel had 50% stenosis. Firstdiagonal was small with ostial 40% stenosis. The circumflex was largeand dominant. There was large proximal subtotal stenosis in the AV groove. The vessel was seen to fill via bridging collaterals and alsoLAD to distal circumflex. There was a mid obtuse marginal which hadlong proximal 30% stenosis, but otherwise was free of high-gradedisease. Posterolateral was long with diffuse luminal irregularities. The PDA was large with moderate-to-mild diffuse plaque, but no high-grade obstructive lesions. The  right coronary artery was dominantwithout high-grade lesions. Left ventriculogram, the  left ventriculogram was approximately 40%. It was seen in the right anterior oblique view. There was anterior wall akinesis and inferolateral hypokinesis. CONCLUSION: Severe single-vessel coronary artery disease with long subtotal stenosis in the circumflex. He has a patent LAD stent. He has a moderately reduced ejection fraction with regional wall motion abnormalities. PLAN: Probably, we will manage the circumflex lesion medically. He needs aggressive risk reduction. I am going to consult EP concerning his syncope as this still could have been a primary arrhythmia especially with his regional wall motion abnormalities and there isevidence of scar on his LV.  06/03/15 Carotid U/S: Patent right CEA with significant hyperplasia noted, velocity within the < 40% range. Less than [id number] LICA stenosis, calcific plaque may obscure higher velocity. No significant change.  05/31/14 PFT: FVC=2.72 (61%), FEV1=1.56 (47%), %1sec=57, mid-flows=31% predicted; c/w GOLD Stage3 COPD.  05/06/15 CXR: FINDINGS: - AICD remains in good position. Heart size upper normal. Negative for heart failure. Atherosclerotic aortic arch with calcification - Improvement in left lower lobe infiltrate compatible with clearing pneumonia. No new area of infiltrate. No mass lesion. Mild scarring in the left lung base again noted. There is chronic lung disease with apical scarring bilaterally. Chronic right rib fractures appear healed. IMPRESSION: Clearing left lower lobe pneumonia.  Preoperative labs noted. PLT 82K--down from 153K on 05/11/15, 96K 06/01/14, 112K 06/09/13. Per Holley Raring, Dr. Janace Hoard is okay proceeding with PLT count of 82K.  Further evaluation on arrival tomorrow by his surgeon and anesthesiologist to ensure no acute changes.  George Hugh [facility] Short Stay Center/Anesthesiology Phone [phone] 12/12/2015 3:55 PM

## 2015-12-12 NOTE — Telephone Encounter (Signed)
Spoke with Glenda from ENT. Needing surgical clearance on pt. He is going to have excision of left ear lesion w/ possible skin graft done. It will be done under general anesthesia and this can be faxed to [phone]. Please advise Dr. Lenna Gilford thanks

## 2015-12-12 NOTE — Patient Instructions (Addendum)
Your advair and lisinopril will need to be monitored as it's possible for them to cause problems with coughing fits but for now they seem to be ok   You are cleared for surgery and we can see you if needed post operatively

## 2015-12-12 NOTE — Telephone Encounter (Signed)
Since this is totally elective will need ov to clear, ok to add on and I would be happy to do it

## 2015-12-12 NOTE — Telephone Encounter (Signed)
Lattie Haw with Dr. Janace Hoard return our call. She states that the patient moved up his pre-op appointment and will be able to make ov with Mw today at 2:45pm. Nothing further needed.

## 2015-12-13 ENCOUNTER — Encounter (HOSPITAL_COMMUNITY): Payer: Self-pay | Admitting: Anesthesiology

## 2015-12-13 ENCOUNTER — Observation Stay (HOSPITAL_COMMUNITY)
Admission: RE | Admit: 2015-12-13 | Discharge: 2015-12-14 | Disposition: A | Discharge status: Home / Self Care | Payer: Medicare Other | Source: Ambulatory Visit | Attending: Otolaryngology | Admitting: Otolaryngology

## 2015-12-13 ENCOUNTER — Ambulatory Visit (HOSPITAL_COMMUNITY): Payer: Medicare Other | Admitting: Vascular Surgery

## 2015-12-13 ENCOUNTER — Encounter (HOSPITAL_COMMUNITY)
Admission: RE | Disposition: A | Discharge status: Home / Self Care | Payer: Self-pay | Source: Ambulatory Visit | Attending: Otolaryngology

## 2015-12-13 ENCOUNTER — Ambulatory Visit (HOSPITAL_COMMUNITY): Payer: Medicare Other | Admitting: Anesthesiology

## 2015-12-13 DIAGNOSIS — C44212 Basal cell carcinoma of skin of right ear and external auricular canal: Principal | ICD-10-CM | POA: Insufficient documentation

## 2015-12-13 DIAGNOSIS — I252 Old myocardial infarction: Secondary | ICD-10-CM | POA: Insufficient documentation

## 2015-12-13 DIAGNOSIS — I2510 Atherosclerotic heart disease of native coronary artery without angina pectoris: Secondary | ICD-10-CM | POA: Diagnosis not present

## 2015-12-13 DIAGNOSIS — C4491 Basal cell carcinoma of skin, unspecified: Secondary | ICD-10-CM | POA: Diagnosis not present

## 2015-12-13 DIAGNOSIS — I739 Peripheral vascular disease, unspecified: Secondary | ICD-10-CM | POA: Diagnosis not present

## 2015-12-13 DIAGNOSIS — I255 Ischemic cardiomyopathy: Secondary | ICD-10-CM | POA: Diagnosis not present

## 2015-12-13 DIAGNOSIS — Z79899 Other long term (current) drug therapy: Secondary | ICD-10-CM | POA: Insufficient documentation

## 2015-12-13 DIAGNOSIS — M1990 Unspecified osteoarthritis, unspecified site: Secondary | ICD-10-CM | POA: Insufficient documentation

## 2015-12-13 DIAGNOSIS — E7800 Pure hypercholesterolemia, unspecified: Secondary | ICD-10-CM | POA: Insufficient documentation

## 2015-12-13 DIAGNOSIS — I10 Essential (primary) hypertension: Secondary | ICD-10-CM | POA: Insufficient documentation

## 2015-12-13 DIAGNOSIS — Z955 Presence of coronary angioplasty implant and graft: Secondary | ICD-10-CM | POA: Insufficient documentation

## 2015-12-13 DIAGNOSIS — Z95810 Presence of automatic (implantable) cardiac defibrillator: Secondary | ICD-10-CM | POA: Insufficient documentation

## 2015-12-13 DIAGNOSIS — C44211 Basal cell carcinoma of skin of unspecified ear and external auricular canal: Secondary | ICD-10-CM | POA: Diagnosis present

## 2015-12-13 DIAGNOSIS — Z85828 Personal history of other malignant neoplasm of skin: Secondary | ICD-10-CM | POA: Insufficient documentation

## 2015-12-13 DIAGNOSIS — M109 Gout, unspecified: Secondary | ICD-10-CM | POA: Insufficient documentation

## 2015-12-13 DIAGNOSIS — J449 Chronic obstructive pulmonary disease, unspecified: Secondary | ICD-10-CM | POA: Diagnosis not present

## 2015-12-13 DIAGNOSIS — Z87891 Personal history of nicotine dependence: Secondary | ICD-10-CM | POA: Insufficient documentation

## 2015-12-13 DIAGNOSIS — Z7982 Long term (current) use of aspirin: Secondary | ICD-10-CM | POA: Insufficient documentation

## 2015-12-13 DIAGNOSIS — C44219 Basal cell carcinoma of skin of left ear and external auricular canal: Secondary | ICD-10-CM | POA: Diagnosis not present

## 2015-12-13 HISTORY — PX: EAR CYST EXCISION: SHX22

## 2015-12-13 HISTORY — PX: SKIN SPLIT GRAFT: SHX444

## 2015-12-13 HISTORY — PX: OTHER SURGICAL HISTORY: SHX169

## 2015-12-13 LAB — CBC
HCT: 41.4 % (ref 39.0–52.0)
HEMOGLOBIN: 14.4 g/dL (ref 13.0–17.0)
MCH: 35.5 pg — ABNORMAL HIGH (ref 26.0–34.0)
MCHC: 34.8 g/dL (ref 30.0–36.0)
MCV: 102 fL — ABNORMAL HIGH (ref 78.0–100.0)
PLATELETS: 84 10*3/uL — AB (ref 150–400)
RBC: 4.06 MIL/uL — ABNORMAL LOW (ref 4.22–5.81)
RDW: 13 % (ref 11.5–15.5)
WBC: 7.8 10*3/uL (ref 4.0–10.5)

## 2015-12-13 LAB — CREATININE, SERUM
CREATININE: 0.94 mg/dL (ref 0.61–1.24)
GFR calc Af Amer: >60 mL/min (ref >60)

## 2015-12-13 SURGERY — EXCISION, CYST, EAR
Anesthesia: General | Site: Ear | Laterality: Left

## 2015-12-13 MED ORDER — FENTANYL CITRATE (PF) 100 MCG/2ML IJ SOLN: 25.0000 ug | INTRAMUSCULAR | Status: DC | PRN

## 2015-12-13 MED ORDER — FENTANYL CITRATE (PF) 100 MCG/2ML IJ SOLN
INTRAMUSCULAR | Status: DC | PRN
  Administered 2015-12-13: 25 ug via INTRAVENOUS
  Administered 2015-12-13 (×2): 50 ug via INTRAVENOUS

## 2015-12-13 MED ORDER — VITAMIN D 1000 UNITS PO TABS
1000.0000 [IU] | ORAL_TABLET | Freq: Every day | ORAL | Status: DC
  Administered 2015-12-13: 1000 [IU] via ORAL
  Filled 2015-12-13: qty 1

## 2015-12-13 MED ORDER — MULTIVITAMINS PO CAPS: 1.0000 | ORAL_CAPSULE | Freq: Every day | ORAL | Status: DC

## 2015-12-13 MED ORDER — ONDANSETRON HCL 4 MG/2ML IJ SOLN: 4.0000 mg | Freq: Once | INTRAMUSCULAR | Status: DC | PRN

## 2015-12-13 MED ORDER — LIDOCAINE HCL (CARDIAC) 20 MG/ML IV SOLN
INTRAVENOUS | Status: DC | PRN
  Administered 2015-12-13: 60 mg via INTRAVENOUS

## 2015-12-13 MED ORDER — FENTANYL CITRATE (PF) 250 MCG/5ML IJ SOLN
INTRAMUSCULAR | Status: AC
  Filled 2015-12-13: qty 5

## 2015-12-13 MED ORDER — CARVEDILOL 3.125 MG PO TABS
3.1250 mg | ORAL_TABLET | Freq: Two times a day (BID) | ORAL | Status: DC
  Administered 2015-12-13 – 2015-12-14 (×2): 3.125 mg via ORAL
  Filled 2015-12-13 (×2): qty 1

## 2015-12-13 MED ORDER — ALLOPURINOL 100 MG PO TABS
100.0000 mg | ORAL_TABLET | Freq: Every day | ORAL | Status: DC
  Filled 2015-12-13: qty 1

## 2015-12-13 MED ORDER — PROPOFOL 10 MG/ML IV BOLUS
INTRAVENOUS | Status: AC
  Filled 2015-12-13: qty 20

## 2015-12-13 MED ORDER — BACITRACIN ZINC 500 UNIT/GM EX OINT
TOPICAL_OINTMENT | CUTANEOUS | Status: AC
  Filled 2015-12-13: qty 28.35

## 2015-12-13 MED ORDER — DEXTROSE-NACL 5-0.45 % IV SOLN
INTRAVENOUS | Status: DC
  Administered 2015-12-13 – 2015-12-14 (×3): via INTRAVENOUS

## 2015-12-13 MED ORDER — LACTATED RINGERS IV SOLN
INTRAVENOUS | Status: DC
  Administered 2015-12-13 (×2): via INTRAVENOUS

## 2015-12-13 MED ORDER — CEFAZOLIN SODIUM-DEXTROSE 2-3 GM-% IV SOLR
2.0000 g | Freq: Once | INTRAVENOUS | Status: AC
  Administered 2015-12-13: 2 g via INTRAVENOUS

## 2015-12-13 MED ORDER — LIDOCAINE-EPINEPHRINE 1 %-1:100000 IJ SOLN
INTRAMUSCULAR | Status: DC | PRN
  Administered 2015-12-13: 1 mL

## 2015-12-13 MED ORDER — ONDANSETRON HCL 4 MG/2ML IJ SOLN
INTRAMUSCULAR | Status: DC | PRN
  Administered 2015-12-13: 4 mg via INTRAVENOUS

## 2015-12-13 MED ORDER — OXYCODONE HCL 5 MG PO TABS: 5.0000 mg | ORAL_TABLET | ORAL | Status: DC | PRN

## 2015-12-13 MED ORDER — ONDANSETRON HCL 4 MG/2ML IJ SOLN
INTRAMUSCULAR | Status: AC
  Filled 2015-12-13: qty 2

## 2015-12-13 MED ORDER — ADULT MULTIVITAMIN W/MINERALS CH: 1.0000 | ORAL_TABLET | Freq: Every day | ORAL | Status: DC

## 2015-12-13 MED ORDER — EPHEDRINE SULFATE 50 MG/ML IJ SOLN
INTRAMUSCULAR | Status: DC | PRN
Start: 2015-12-13 — End: 2015-12-13
  Administered 2015-12-13: 5 mg via INTRAVENOUS
  Administered 2015-12-13: 10 mg via INTRAVENOUS
  Administered 2015-12-13 (×4): 5 mg via INTRAVENOUS

## 2015-12-13 MED ORDER — OXYCODONE HCL 5 MG/5ML PO SOLN: 5.0000 mg | Freq: Once | ORAL | Status: DC | PRN

## 2015-12-13 MED ORDER — PRESERVISION/LUTEIN PO CAPS: 1.0000 | ORAL_CAPSULE | Freq: Every day | ORAL | Status: DC

## 2015-12-13 MED ORDER — PHENYLEPHRINE HCL 10 MG/ML IJ SOLN
INTRAMUSCULAR | Status: DC | PRN
  Administered 2015-12-13 (×3): 40 ug via INTRAVENOUS
  Administered 2015-12-13: 80 ug via INTRAVENOUS
  Administered 2015-12-13 (×2): 40 ug via INTRAVENOUS

## 2015-12-13 MED ORDER — LISINOPRIL 5 MG PO TABS
5.0000 mg | ORAL_TABLET | Freq: Every day | ORAL | Status: DC
  Administered 2015-12-13: 5 mg via ORAL
  Filled 2015-12-13: qty 1

## 2015-12-13 MED ORDER — 0.9 % SODIUM CHLORIDE (POUR BTL) OPTIME
TOPICAL | Status: DC | PRN
  Administered 2015-12-13: 1000 mL

## 2015-12-13 MED ORDER — MORPHINE SULFATE (PF) 2 MG/ML IV SOLN: 2.0000 mg | INTRAVENOUS | Status: DC | PRN

## 2015-12-13 MED ORDER — HEPARIN SODIUM (PORCINE) 5000 UNIT/ML IJ SOLN
5000.0000 [IU] | Freq: Three times a day (TID) | INTRAMUSCULAR | Status: DC
  Administered 2015-12-13 – 2015-12-14 (×3): 5000 [IU] via SUBCUTANEOUS
  Filled 2015-12-13 (×4): qty 1

## 2015-12-13 MED ORDER — CEFAZOLIN SODIUM-DEXTROSE 2-3 GM-% IV SOLR
2.0000 g | Freq: Three times a day (TID) | INTRAVENOUS | Status: DC
  Administered 2015-12-13 – 2015-12-14 (×2): 2 g via INTRAVENOUS
  Filled 2015-12-13 (×5): qty 50

## 2015-12-13 MED ORDER — LIDOCAINE-EPINEPHRINE 1 %-1:100000 IJ SOLN
INTRAMUSCULAR | Status: AC
  Filled 2015-12-13: qty 1

## 2015-12-13 MED ORDER — CEFAZOLIN SODIUM-DEXTROSE 2-3 GM-% IV SOLR
INTRAVENOUS | Status: AC
  Filled 2015-12-13: qty 50

## 2015-12-13 MED ORDER — ROCURONIUM BROMIDE 50 MG/5ML IV SOLN
INTRAVENOUS | Status: AC
  Filled 2015-12-13: qty 1

## 2015-12-13 MED ORDER — OXYCODONE HCL 5 MG PO TABS: 5.0000 mg | ORAL_TABLET | Freq: Once | ORAL | Status: DC | PRN

## 2015-12-13 MED ORDER — PROPOFOL 10 MG/ML IV BOLUS
INTRAVENOUS | Status: DC | PRN
  Administered 2015-12-13: 150 mg via INTRAVENOUS
  Administered 2015-12-13: 30 mg via INTRAVENOUS
  Administered 2015-12-13: 50 mg via INTRAVENOUS

## 2015-12-13 MED ORDER — LIDOCAINE HCL (CARDIAC) 20 MG/ML IV SOLN
INTRAVENOUS | Status: AC
  Filled 2015-12-13: qty 5

## 2015-12-13 MED ORDER — ASPIRIN EC 81 MG PO TBEC
81.0000 mg | DELAYED_RELEASE_TABLET | Freq: Every day | ORAL | Status: DC
  Administered 2015-12-13: 81 mg via ORAL
  Filled 2015-12-13: qty 1

## 2015-12-13 SURGICAL SUPPLY — 75 items
ADH SKN CLS APL DERMABOND .7 (GAUZE/BANDAGES/DRESSINGS) ×1
AIRSTRIP 4 3/4X3 1/4 7185 (GAUZE/BANDAGES/DRESSINGS) IMPLANT
ATTRACTOMAT 16X20 MAGNETIC DRP (DRAPES) IMPLANT
BALL CTTN LRG ABS STRL LF (GAUZE/BANDAGES/DRESSINGS) ×1
BLADE 10 SAFETY STRL DISP (BLADE) ×3 IMPLANT
BLADE SURG 15 STRL LF DISP TIS (BLADE) IMPLANT
BLADE SURG 15 STRL SS (BLADE)
BNDG CONFORM 2 STRL LF (GAUZE/BANDAGES/DRESSINGS) IMPLANT
BNDG GAUZE ELAST 4 BULKY (GAUZE/BANDAGES/DRESSINGS) IMPLANT
CANISTER SUCTION 2500CC (MISCELLANEOUS) IMPLANT
CATH ROBINSON RED A/P 16FR (CATHETERS) IMPLANT
CLEANER TIP ELECTROSURG 2X2 (MISCELLANEOUS) ×3 IMPLANT
CONT SPEC 4OZ CLIKSEAL STRL BL (MISCELLANEOUS) ×9 IMPLANT
COTTONBALL LRG STERILE PKG (GAUZE/BANDAGES/DRESSINGS) ×2 IMPLANT
COVER SURGICAL LIGHT HANDLE (MISCELLANEOUS) ×3 IMPLANT
CRADLE DONUT ADULT HEAD (MISCELLANEOUS) IMPLANT
DERMABOND ADVANCED (GAUZE/BANDAGES/DRESSINGS) ×2
DERMABOND ADVANCED .7 DNX12 (GAUZE/BANDAGES/DRESSINGS) ×1 IMPLANT
DRAIN PENROSE 1/4X12 LTX STRL (WOUND CARE) IMPLANT
DRAIN SNY 10 ROU (WOUND CARE) IMPLANT
DRAIN SNY 7 FPER (WOUND CARE) IMPLANT
DRAPE PROXIMA HALF (DRAPES) IMPLANT
DRSG EMULSION OIL 3X3 NADH (GAUZE/BANDAGES/DRESSINGS) IMPLANT
ELECT COATED BLADE 2.86 ST (ELECTRODE) ×3 IMPLANT
ELECT NDL BLADE 2-5/6 (NEEDLE) ×1 IMPLANT
ELECT NEEDLE BLADE 2-5/6 (NEEDLE) ×3 IMPLANT
ELECT REM PT RETURN 9FT ADLT (ELECTROSURGICAL) ×3
ELECTRODE REM PT RTRN 9FT ADLT (ELECTROSURGICAL) ×1 IMPLANT
GAUZE SPONGE 4X4 12PLY STRL (GAUZE/BANDAGES/DRESSINGS) IMPLANT
GAUZE SPONGE 4X4 16PLY XRAY LF (GAUZE/BANDAGES/DRESSINGS) ×4 IMPLANT
GAUZE XEROFORM 5X9 LF (GAUZE/BANDAGES/DRESSINGS) ×2 IMPLANT
GLOVE BIOGEL PI IND STRL 7.0 (GLOVE) IMPLANT
GLOVE BIOGEL PI IND STRL 7.5 (GLOVE) IMPLANT
GLOVE BIOGEL PI INDICATOR 7.0 (GLOVE) ×2
GLOVE BIOGEL PI INDICATOR 7.5 (GLOVE) ×2
GLOVE ECLIPSE 7.5 STRL STRAW (GLOVE) ×3 IMPLANT
GLOVE SURG SS PI 6.5 STRL IVOR (GLOVE) ×2 IMPLANT
GLOVE SURG SS PI 7.5 STRL IVOR (GLOVE) ×6 IMPLANT
GOWN STRL REUS W/ TWL LRG LVL3 (GOWN DISPOSABLE) ×2 IMPLANT
GOWN STRL REUS W/ TWL XL LVL3 (GOWN DISPOSABLE) ×1 IMPLANT
GOWN STRL REUS W/TWL LRG LVL3 (GOWN DISPOSABLE) ×9
GOWN STRL REUS W/TWL XL LVL3 (GOWN DISPOSABLE) ×3
KIT BASIN OR (CUSTOM PROCEDURE TRAY) ×3 IMPLANT
KIT ROOM TURNOVER OR (KITS) ×3 IMPLANT
NDL 25GX 5/8IN NON SAFETY (NEEDLE) IMPLANT
NDL HYPO 25GX1X1/2 BEV (NEEDLE) IMPLANT
NEEDLE 25GX 5/8IN NON SAFETY (NEEDLE) IMPLANT
NEEDLE HYPO 25GX1X1/2 BEV (NEEDLE) ×3 IMPLANT
NS IRRIG 1000ML POUR BTL (IV SOLUTION) ×3 IMPLANT
PAD ARMBOARD 7.5X6 YLW CONV (MISCELLANEOUS) ×6 IMPLANT
PAD ONESTEP ZOLL R SERIES ADT (MISCELLANEOUS) ×2 IMPLANT
PENCIL FOOT CONTROL (ELECTRODE) ×3 IMPLANT
POUCH STERILIZING 3 X22 (STERILIZATION PRODUCTS) IMPLANT
SPONGE GAUZE 4X4 12PLY STER LF (GAUZE/BANDAGES/DRESSINGS) ×2 IMPLANT
STAPLER VISISTAT 35W (STAPLE) ×5 IMPLANT
SUT CHROMIC 3 0 PS 2 (SUTURE) ×2 IMPLANT
SUT CHROMIC 4 0 P 3 18 (SUTURE) ×5 IMPLANT
SUT CHROMIC 4 0 PS 2 18 (SUTURE) ×2 IMPLANT
SUT ETHILON 3 0 FSL (SUTURE) ×6 IMPLANT
SUT ETHILON 3 0 PS 1 (SUTURE) ×19 IMPLANT
SUT ETHILON 4 0 PS 2 18 (SUTURE) ×3 IMPLANT
SUT ETHILON 5 0 P 3 18 (SUTURE) ×2
SUT NYLON ETHILON 5-0 P-3 1X18 (SUTURE) ×1 IMPLANT
SUT PLAIN 5 0 P 3 18 (SUTURE) ×2 IMPLANT
SUT SILK 4 0 (SUTURE) ×3
SUT SILK 4-0 18XBRD TIE 12 (SUTURE) ×1 IMPLANT
SWAB COLLECTION DEVICE MRSA (MISCELLANEOUS) IMPLANT
SYR BULB IRRIGATION 50ML (SYRINGE) IMPLANT
SYR TB 1ML LUER SLIP (SYRINGE) IMPLANT
TAPE CLOTH SURG 6X10 WHT LF (GAUZE/BANDAGES/DRESSINGS) ×2 IMPLANT
TOWEL OR 17X24 6PK STRL BLUE (TOWEL DISPOSABLE) ×3 IMPLANT
TRAY ENT MC OR (CUSTOM PROCEDURE TRAY) ×3 IMPLANT
TUBE ANAEROBIC SPECIMEN COL (MISCELLANEOUS) IMPLANT
WATER STERILE IRR 1000ML POUR (IV SOLUTION) ×3 IMPLANT
YANKAUER SUCT BULB TIP NO VENT (SUCTIONS) IMPLANT

## 2015-12-13 NOTE — Anesthesia Postprocedure Evaluation (Signed)
Anesthesia Post Note  Patient: [name]  Procedure(s) Performed: Procedure(s) (LRB): Excision left ear lesion  (Left) with possible skin graft (Left)  Patient location during evaluation: PACU Anesthesia Type: General Level of consciousness: awake, awake and alert and oriented Pain management: pain level controlled Vital Signs Assessment: post-procedure vital signs reviewed and stable Respiratory status: spontaneous breathing, nonlabored ventilation and respiratory function stable Cardiovascular status: blood pressure returned to baseline Anesthetic complications: no    Last Vitals:  Filed Vitals:   12/13/15 1345 12/13/15 1453  BP: 171/85 164/72  Pulse: 67 72  Temp:  36.3 C  Resp: 13 16    Last Pain: There were no vitals filed for this visit.               Tu Shimmel COKER

## 2015-12-13 NOTE — Anesthesia Procedure Notes (Signed)
Procedure Name: LMA Insertion Date/Time: 12/13/2015 9:36 AM Performed by: Jenne Campus Pre-anesthesia Checklist: Patient identified, Emergency Drugs available, Suction available, Patient being monitored and Timeout performed Patient Re-evaluated:Patient Re-evaluated prior to inductionOxygen Delivery Method: Circle system utilized Preoxygenation: Pre-oxygenation with 100% oxygen Intubation Type: IV induction Ventilation: Mask ventilation without difficulty LMA: LMA inserted LMA Size: 5.0 Number of attempts: 1 Placement Confirmation: positive ETCO2,  CO2 detector and breath sounds checked- equal and bilateral Tube secured with: Tape Dental Injury: Teeth and Oropharynx as per pre-operative assessment

## 2015-12-13 NOTE — Transfer of Care (Signed)
Immediate Anesthesia Transfer of Care Note  Patient: [name]  Procedure(s) Performed: Procedure(s): Excision left ear lesion  (Left) with possible skin graft (Left)  Patient Location: PACU  Anesthesia Type:General  Level of Consciousness: awake, oriented and patient cooperative  Airway & Oxygen Therapy: Patient Spontanous Breathing and Patient connected to nasal cannula oxygen  Post-op Assessment: Report given to RN and Post -op Vital signs reviewed and stable  Post vital signs: Reviewed and stable  Last Vitals:  Filed Vitals:   12/13/15 0751  BP: 173/92  Pulse: 61  Temp: [id number] C    Complications: No apparent anesthesia complications

## 2015-12-13 NOTE — H&P (Signed)
[name] is an 82 y.o. male.   Chief Complaint: left ear cancerHPI: hx of BCCA of the left postauricular sulcus. He now is here for excision and reconstrcution  Past Medical History  Diagnosis Date  . Allergic rhinitis   . COPD (chronic obstructive pulmonary disease) ([place])   . Tobacco abuse   . Hypertension   . Atherosclerotic heart disease   . Ischemic cardiomyopathy     EF improved to normal: Echo 8/14:  Mild LVH, focal basal hypertrophy, EF 60-65%, normal wall motion, mild BAE, PASP 36  . Syncope   . Presence of cardiac defibrillator   . Peripheral vascular disease ([place])   . Hypercholesterolemia   . Diverticulosis of colon   . History of colonic polyps   . Hemorrhoids   . Abnormal liver function tests   . History of gout   . Back pain   . Compression fracture   . Osteoporosis   . Dermatitis   . T wave over sensing resulting in inappropriate shocks 08/14/2013  . DJD (degenerative joint disease)     and Gout  . CAD (coronary artery disease)   . Myocardial infarction ([place])   . AICD (automatic cardioverter/defibrillator) present   . Pneumonia   . Skin cancer     shoulders and forehead    Past Surgical History  Procedure Laterality Date  . Abdominal aortic aneurysm repair  2002    by Dr. Kellie Simmering  . Cad stent  02/2002    Dr. Percival Spanish  . Aicd placed  03/2008    Dr. Caryl Comes  . Right corotid enderectomy  12/2010    Dr. Scot Dock  . Carotid endarterectomy Right January 02, 2011  . Cardiac defibrillator placement  2010  . Cardiac catheterization      X 2 stents  . Tonsillectomy      Family History  Problem Relation Age of Onset  . Hypertension Father   . Heart disease Father     Heart Disease before age 64  . Hypertension Mother   . Heart disease Mother     Heart Disease before age 13  . Cancer Mother    Social History:  reports that he quit smoking about 3 years ago. His smoking use included Cigars. He has never used smokeless tobacco. He reports that he drinks about  1.8 oz of alcohol per week. He reports that he does not use illicit drugs.  Allergies: No Known Allergies  Medications Prior to Admission  Medication Sig Dispense Refill  . ADVAIR DISKUS 250-50 MCG/DOSE AEPB INHALE 1 PUFF TWICE DAILY. (Patient taking differently: INHALE 1 PUFF  DAILY.) 60 each 5  . allopurinol (ZYLOPRIM) 100 MG tablet TAKE 1 TABLET ONCE DAILY. 30 tablet 0  . aspirin 81 MG tablet Take 81 mg by mouth daily. Reported on 12/12/2015    . carvedilol (COREG) 3.125 MG tablet Take 3.125 mg by mouth 2 (two) times daily with a meal.   5  . cholecalciferol (VITAMIN D) 1000 units tablet Take 1,000 Units by mouth daily.    Marland Kitchen lisinopril (PRINIVIL,ZESTRIL) 5 MG tablet Take 5 mg by mouth daily.   5  . Multiple Vitamin (MULTIVITAMIN) capsule Take 1 capsule by mouth daily.    . Multiple Vitamins-Minerals (PRESERVISION/LUTEIN) CAPS Take 1 capsule by mouth daily.     . psyllium (METAMUCIL) 58.6 % powder Take 1 packet by mouth daily.     . simvastatin (ZOCOR) 40 MG tablet TAKE ONE TABLET AT BEDTIME. 90 tablet 0  Results for orders placed or performed during the hospital encounter of 12/12/15 (from the past 48 hour(s))  CBC     Status: Abnormal   Collection Time: 12/12/15 12:40 PM  Result Value Ref Range   WBC 7.4 4.0 - 10.5 K/uL   RBC 4.16 (L) 4.22 - 5.81 MIL/uL   Hemoglobin 14.3 13.0 - 17.0 g/dL   HCT 41.3 39.0 - 52.0 %   MCV 99.3 78.0 - 100.0 fL   MCH 34.4 (H) 26.0 - 34.0 pg   MCHC 34.6 30.0 - 36.0 g/dL   RDW 12.7 11.5 - 15.5 %   Platelets 82 (L) 150 - 400 K/uL    Comment: REPEATED TO VERIFY PLATELET COUNT CONFIRMED BY SMEAR   Basic metabolic panel     Status: None   Collection Time: 12/12/15 12:40 PM  Result Value Ref Range   Sodium 135 135 - 145 mmol/L   Potassium 4.3 3.5 - 5.1 mmol/L   Chloride 102 101 - 111 mmol/L   CO2 24 22 - 32 mmol/L   Glucose, Bld 86 65 - 99 mg/dL   BUN 8 6 - 20 mg/dL   Creatinine, Ser 0.86 0.61 - 1.24 mg/dL   Calcium 9.5 8.9 - 10.3 mg/dL   GFR  calc non Af Amer >60 >60 mL/min   GFR calc Af Amer >60 >60 mL/min    Comment: (NOTE) The eGFR has been calculated using the CKD EPI equation. This calculation has not been validated in all clinical situations. eGFR's persistently <60 mL/min signify possible Chronic Kidney Disease.    Anion gap 9 5 - 15   No results found.  Review of Systems  Constitutional: Negative.   HENT: Negative.   Eyes: Negative.   Respiratory: Negative.   Cardiovascular: Negative.   Skin: Negative.   Neurological: Negative.     Blood pressure 173/92, pulse 61, temperature 97.4 F (36.3 C), temperature source Oral, height 5' 11"  (1.803 m), weight 83.462 kg (184 lb), SpO2 99 %. Physical Exam  Constitutional: He appears well-developed and well-nourished.  HENT:  Nose: Nose normal.  Mouth/Throat: Oropharynx is clear and moist.  Eyes: Conjunctivae are normal. Pupils are equal, round, and reactive to light.  Neck: Normal range of motion. Neck supple.  Cardiovascular: Normal rate.   Respiratory: Effort normal.  GI: Soft.  Musculoskeletal: Normal range of motion.     Assessment/Plan Left ear BCCA- we discussed procedure of excision and skin graft or rotation flap and ready to proceed. He has had cardiology and pulmonary consults.  Melissa Montane, MD 12/13/2015, 8:28 AM

## 2015-12-13 NOTE — Addendum Note (Signed)
Addended by: Christinia Gully B on: 12/13/2015 06:18 AM   Modules accepted: Level of Service

## 2015-12-13 NOTE — Progress Notes (Signed)
12/13/2015 5:25 PM  Jolyn Nap [id number]  Post-Op Check    Temp:  [97.3 F (36.3 C)-97.4 F (36.3 C)] 97.3 F (36.3 C) (02/21 1453) Pulse Rate:  [61-78] 72 (02/21 1453) Resp:  [8-16] 16 (02/21 1453) BP: (152-173)/(72-92) 164/72 mmHg (02/21 1453) SpO2:  [97 %-100 %] 98 % (02/21 1453) Weight:  [83.462 kg (184 lb)] 83.462 kg (184 lb) (02/21 0751),     Intake/Output Summary (Last 24 hours) at 12/13/15 1725 Last data filed at 12/13/15 1217  Gross per 24 hour  Intake   1300 ml  Output     50 ml  Net   1250 ml    Results for orders placed or performed during the hospital encounter of 12/13/15 (from the past 24 hour(s))  CBC     Status: Abnormal   Collection Time: 12/13/15  4:22 PM  Result Value Ref Range   WBC 7.8 4.0 - 10.5 K/uL   RBC 4.06 (L) 4.22 - 5.81 MIL/uL   Hemoglobin 14.4 13.0 - 17.0 g/dL   HCT 41.4 39.0 - 52.0 %   MCV 102.0 (H) 78.0 - 100.0 fL   MCH 35.5 (H) 26.0 - 34.0 pg   MCHC 34.8 30.0 - 36.0 g/dL   RDW 13.0 11.5 - 15.5 %   Platelets 84 (L) 150 - 400 K/uL  Creatinine, serum     Status: None   Collection Time: 12/13/15  4:22 PM  Result Value Ref Range   Creatinine, Ser 0.94 0.61 - 1.24 mg/dL   GFR calc non Af Amer >60 >60 mL/min   GFR calc Af Amer >60 >60 mL/min    SUBJECTIVE:  Min pain.  No chest pain or SOB.  Drinking easily.  Void x 3.   OBJECTIVE:  Awake, alert.  Voice and breathing intact.  Wounds secure and clean.    IMPRESSION:  Satisfactory check  PLAN:  Advance diet.  Home in AM if doing well.    Jodi Marble

## 2015-12-13 NOTE — Anesthesia Preprocedure Evaluation (Signed)
Anesthesia Evaluation  Patient identified by MRN, date of birth, ID band Patient awake    Reviewed: Allergy & Precautions, NPO status , Patient's Chart, lab work & pertinent test results  Airway Mallampati: II  TM Distance: >3 FB Neck ROM: Full    Dental   Pulmonary former smoker,    breath sounds clear to auscultation       Cardiovascular hypertension,  Rhythm:Regular Rate:Normal     Neuro/Psych    GI/Hepatic   Endo/Other    Renal/GU      Musculoskeletal   Abdominal   Peds  Hematology   Anesthesia Other Findings   Reproductive/Obstetrics                             Anesthesia Physical Anesthesia Plan  ASA: III  Anesthesia Plan: General   Post-op Pain Management:    Induction: Intravenous  Airway Management Planned: LMA  Additional Equipment:   Intra-op Plan:   Post-operative Plan:   Informed Consent: I have reviewed the patients History and Physical, chart, labs and discussed the procedure including the risks, benefits and alternatives for the proposed anesthesia with the patient or authorized representative who has indicated his/her understanding and acceptance.     Plan Discussed with: Anesthesiologist  Anesthesia Plan Comments:         Anesthesia Quick Evaluation

## 2015-12-13 NOTE — Op Note (Signed)
preop/postop diagnosis: Basal cell carcinoma of the left R auricle and postauricular sulcus Procedure: Resection of left carcinoma with reconstruction with full-thickness skin graft Anesthesia: Gen. Estimated blood loss: Approximately 25 mL Indications: 82 year old with a biopsy proven basal cell carcinoma of the left postauricular sulcus region. He has had previous cancers removed by dermatology. This one had cystic component to it. It was discussed regarding resection and reconstruction. He wants to proceed. We discussed risks, benefits, and options. All questions were answered and consent was obtained. Operation: Patient was taken to the operating room placed in the supine position after general endotracheal tube anesthesia was placed in the right gaze position. Prepped and draped in the usual sterile manner. The lesion was outlined with a marking pencil which required extending the incision up onto the auricle and into the scalp area. Around the cystic 2 lesions there was a half a centimeter margin and then down inferior where there was a irregular area on the skin up on the posterior are local that was outlined around that and then wrought down back into the sulcus. The area was resected with a 15 blade and electrocautery. The area inferiorly where the additional basal cell separate from the 2 cystic areas sent for frozen section as it was the closest margin by visualization. It was positive and further skin was resected. The area had a additional positive margin of the stroma and not the skin. The stroma was then removed including the cartilage of the article as a margin. The remaining deep sections of the tumor were checked with frozen section which were negative. The specimen was marked and sent for permanent. The area was too large of resected area to primarily close as the wound extended fairly extensively up on to the posterior aspect of the auricle. The skin graft was thus harvested from the  supraclavicular area using a 15 blade harvesting a full-thickness skin graft. His graft was then positioned into the wound with the needing soft tissue of the scalp was undermined and brought anteriorly suturing it to the underlying soft tissue. The skin graft was then positioned and secured with 4-0 chromic. It was then brought up on the superior aspect of the unclosed postauricular wound. Once all secured this redundancy of the skin graft allowed a sulcus to be created by placing a bolster into the sulcus with Xeroform and cotton. 3 0 nylon sutures were placed around the site. 2 sutures were placed through and through the auricle and bolstered with Xeroform. All of the ties were secured allowing the bolster be tight into the sulcus and the graft up on the auricle to be pressed. Prior to the bolster be in placed several 50 plain gut sutures were placed through the article to secure the graft to the underlying tissue. The graft site was closed by undermining the skin and interrupted 3-0 nylon used to close. The patient was then awakened brought to recovery room in stable condition counts correct

## 2015-12-14 ENCOUNTER — Encounter (HOSPITAL_COMMUNITY): Payer: Self-pay | Admitting: Otolaryngology

## 2015-12-14 DIAGNOSIS — I739 Peripheral vascular disease, unspecified: Secondary | ICD-10-CM | POA: Diagnosis not present

## 2015-12-14 DIAGNOSIS — I2510 Atherosclerotic heart disease of native coronary artery without angina pectoris: Secondary | ICD-10-CM | POA: Diagnosis not present

## 2015-12-14 DIAGNOSIS — C44212 Basal cell carcinoma of skin of right ear and external auricular canal: Secondary | ICD-10-CM | POA: Diagnosis not present

## 2015-12-14 DIAGNOSIS — I255 Ischemic cardiomyopathy: Secondary | ICD-10-CM | POA: Diagnosis not present

## 2015-12-14 DIAGNOSIS — E7800 Pure hypercholesterolemia, unspecified: Secondary | ICD-10-CM | POA: Diagnosis not present

## 2015-12-14 DIAGNOSIS — I10 Essential (primary) hypertension: Secondary | ICD-10-CM | POA: Diagnosis not present

## 2015-12-14 MED ORDER — CEPHALEXIN 500 MG PO CAPS: 500.0000 mg | ORAL_CAPSULE | Freq: Three times a day (TID) | ORAL | Status: DC

## 2015-12-14 NOTE — Discharge Summary (Signed)
Physician Discharge Summary  Patient ID: [name] MRN: [id number] DOB/AGE: 1931/02/06 82 y.o.  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 He has done well over night with good pain control and no compromise of the ear. He is ready to go home. He will follow up in 5-7 days.   Consults: None  Significant Diagnostic Studies: none  Treatments: surgery: as above  Discharge Exam: Blood pressure 136/64, pulse 65, temperature 98 F (36.7 C), temperature source Oral, resp. rate 17, height 5\' 11"  (1.803 m), weight 83.462 kg (184 lb), SpO2 98 %. alert and awake reading the newspaper. Ear looks excellent with bolster in place and the ear with good capillary refill. the donor site looks great. cv- rrr l- ctear. abd-soft ext no tenderness or swelling  Disposition: 01-Home or Self Care     Medication List    ASK your doctor about these medications        ADVAIR DISKUS 250-50 MCG/DOSE Aepb  Generic drug:  Fluticasone-Salmeterol  INHALE 1 PUFF TWICE DAILY.     allopurinol 100 MG tablet  Commonly known as:  ZYLOPRIM  TAKE 1 TABLET ONCE DAILY.     aspirin 81 MG tablet  Take 81 mg by mouth daily. Reported on 12/12/2015     carvedilol 3.125 MG tablet  Commonly known as:  COREG  Take 3.125 mg by mouth 2 (two) times daily with a meal.     cholecalciferol 1000 units tablet  Commonly known as:  VITAMIN D  Take 1,000 Units by mouth daily.     lisinopril 5 MG tablet  Commonly known as:  PRINIVIL,ZESTRIL  Take 5 mg by mouth daily.     multivitamin capsule  Take 1 capsule by mouth daily.     PRESERVISION/LUTEIN Caps  Take 1 capsule by mouth daily.     psyllium 58.6 % powder  Commonly known as:  METAMUCIL  Take 1 packet by mouth daily.     simvastatin 40 MG tablet  Commonly known as:  ZOCOR  TAKE ONE TABLET AT BEDTIME.         SignedMelissa Montane 12/14/2015, 8:48 AM

## 2015-12-14 NOTE — Progress Notes (Signed)
[name] to be D/C'd  per MD order. Discussed with the patient and all questions fully answered.  VSS, Skin clean, dry and intact without evidence of skin break down, no evidence of skin tears noted.  IV catheter discontinued intact. Site without signs and symptoms of complications. Dressing and pressure applied.  An After Visit Summary was printed and given to the patient. Patient received prescription.  D/c education completed with patient/family including follow up instructions, medication list, d/c activities limitations if indicated, with other d/c instructions as indicated by MD - patient able to verbalize understanding, all questions fully answered.   Patient instructed to return to ED, call 911, or call MD for any changes in condition.   Patient to be escorted via [place], and D/C home via private auto.

## 2016-01-15 ENCOUNTER — Other Ambulatory Visit: Payer: Self-pay | Admitting: Pulmonary Disease

## 2016-02-10 DIAGNOSIS — H35372 Puckering of macula, left eye: Secondary | ICD-10-CM | POA: Diagnosis not present

## 2016-02-10 DIAGNOSIS — H43812 Vitreous degeneration, left eye: Secondary | ICD-10-CM | POA: Diagnosis not present

## 2016-02-10 DIAGNOSIS — H3581 Retinal edema: Secondary | ICD-10-CM | POA: Diagnosis not present

## 2016-02-13 ENCOUNTER — Other Ambulatory Visit: Payer: Self-pay | Admitting: Pulmonary Disease

## 2016-02-24 ENCOUNTER — Telehealth: Payer: Self-pay | Admitting: Pulmonary Disease

## 2016-02-24 NOTE — Telephone Encounter (Signed)
Spoke with pt. This message was created in error. This refill request should have been for his wife.

## 2016-03-05 ENCOUNTER — Other Ambulatory Visit: Payer: Self-pay | Admitting: Cardiology

## 2016-03-05 NOTE — Telephone Encounter (Signed)
Rx request sent to pharmacy.  

## 2016-03-13 ENCOUNTER — Other Ambulatory Visit: Payer: Self-pay | Admitting: Pulmonary Disease

## 2016-03-18 ENCOUNTER — Other Ambulatory Visit: Payer: Self-pay | Admitting: Pulmonary Disease

## 2016-03-26 DIAGNOSIS — L570 Actinic keratosis: Secondary | ICD-10-CM | POA: Diagnosis not present

## 2016-03-26 DIAGNOSIS — D225 Melanocytic nevi of trunk: Secondary | ICD-10-CM | POA: Diagnosis not present

## 2016-03-26 DIAGNOSIS — Z08 Encounter for follow-up examination after completed treatment for malignant neoplasm: Secondary | ICD-10-CM | POA: Diagnosis not present

## 2016-03-26 DIAGNOSIS — C44612 Basal cell carcinoma of skin of right upper limb, including shoulder: Secondary | ICD-10-CM | POA: Diagnosis not present

## 2016-03-26 DIAGNOSIS — Z1283 Encounter for screening for malignant neoplasm of skin: Secondary | ICD-10-CM | POA: Diagnosis not present

## 2016-03-26 DIAGNOSIS — X32XXXD Exposure to sunlight, subsequent encounter: Secondary | ICD-10-CM | POA: Diagnosis not present

## 2016-03-26 DIAGNOSIS — Z85828 Personal history of other malignant neoplasm of skin: Secondary | ICD-10-CM | POA: Diagnosis not present

## 2016-03-29 ENCOUNTER — Emergency Department (HOSPITAL_COMMUNITY): Payer: Medicare Other

## 2016-03-29 ENCOUNTER — Inpatient Hospital Stay (HOSPITAL_COMMUNITY)
Admission: EM | Admit: 2016-03-29 | Discharge: 2016-04-09 | DRG: 245 | Disposition: A | Discharge status: Rehabilitation Facility | Payer: Medicare Other | Attending: Internal Medicine | Admitting: Internal Medicine

## 2016-03-29 ENCOUNTER — Other Ambulatory Visit: Payer: Self-pay

## 2016-03-29 ENCOUNTER — Encounter (HOSPITAL_COMMUNITY): Payer: Self-pay | Admitting: Radiology

## 2016-03-29 ENCOUNTER — Encounter: Payer: Self-pay | Admitting: Adult Health

## 2016-03-29 ENCOUNTER — Inpatient Hospital Stay (HOSPITAL_COMMUNITY): Payer: Medicare Other

## 2016-03-29 ENCOUNTER — Other Ambulatory Visit: Payer: Self-pay | Admitting: Nurse Practitioner

## 2016-03-29 DIAGNOSIS — I2584 Coronary atherosclerosis due to calcified coronary lesion: Secondary | ICD-10-CM | POA: Diagnosis present

## 2016-03-29 DIAGNOSIS — S272XXA Traumatic hemopneumothorax, initial encounter: Secondary | ICD-10-CM | POA: Diagnosis not present

## 2016-03-29 DIAGNOSIS — E039 Hypothyroidism, unspecified: Secondary | ICD-10-CM | POA: Diagnosis present

## 2016-03-29 DIAGNOSIS — S3991XA Unspecified injury of abdomen, initial encounter: Secondary | ICD-10-CM | POA: Diagnosis not present

## 2016-03-29 DIAGNOSIS — J9600 Acute respiratory failure, unspecified whether with hypoxia or hypercapnia: Secondary | ICD-10-CM

## 2016-03-29 DIAGNOSIS — I2510 Atherosclerotic heart disease of native coronary artery without angina pectoris: Secondary | ICD-10-CM | POA: Diagnosis present

## 2016-03-29 DIAGNOSIS — I472 Ventricular tachycardia: Secondary | ICD-10-CM | POA: Diagnosis present

## 2016-03-29 DIAGNOSIS — I4901 Ventricular fibrillation: Principal | ICD-10-CM | POA: Diagnosis present

## 2016-03-29 DIAGNOSIS — Z4682 Encounter for fitting and adjustment of non-vascular catheter: Secondary | ICD-10-CM | POA: Diagnosis not present

## 2016-03-29 DIAGNOSIS — I5043 Acute on chronic combined systolic (congestive) and diastolic (congestive) heart failure: Secondary | ICD-10-CM | POA: Diagnosis not present

## 2016-03-29 DIAGNOSIS — I6200 Nontraumatic subdural hemorrhage, unspecified: Secondary | ICD-10-CM

## 2016-03-29 DIAGNOSIS — S62317A Displaced fracture of base of fifth metacarpal bone. left hand, initial encounter for closed fracture: Secondary | ICD-10-CM | POA: Diagnosis not present

## 2016-03-29 DIAGNOSIS — G931 Anoxic brain damage, not elsewhere classified: Secondary | ICD-10-CM | POA: Diagnosis not present

## 2016-03-29 DIAGNOSIS — E7800 Pure hypercholesterolemia, unspecified: Secondary | ICD-10-CM | POA: Diagnosis present

## 2016-03-29 DIAGNOSIS — Z9689 Presence of other specified functional implants: Secondary | ICD-10-CM

## 2016-03-29 DIAGNOSIS — I454 Nonspecific intraventricular block: Secondary | ICD-10-CM | POA: Diagnosis not present

## 2016-03-29 DIAGNOSIS — I462 Cardiac arrest due to underlying cardiac condition: Secondary | ICD-10-CM | POA: Diagnosis present

## 2016-03-29 DIAGNOSIS — S270XXA Traumatic pneumothorax, initial encounter: Secondary | ICD-10-CM | POA: Diagnosis present

## 2016-03-29 DIAGNOSIS — Z955 Presence of coronary angioplasty implant and graft: Secondary | ICD-10-CM

## 2016-03-29 DIAGNOSIS — S62321B Displaced fracture of shaft of second metacarpal bone, left hand, initial encounter for open fracture: Secondary | ICD-10-CM | POA: Diagnosis not present

## 2016-03-29 DIAGNOSIS — S065X0A Traumatic subdural hemorrhage without loss of consciousness, initial encounter: Secondary | ICD-10-CM | POA: Diagnosis not present

## 2016-03-29 DIAGNOSIS — S62301B Unspecified fracture of second metacarpal bone, left hand, initial encounter for open fracture: Secondary | ICD-10-CM | POA: Diagnosis not present

## 2016-03-29 DIAGNOSIS — T8182XA Emphysema (subcutaneous) resulting from a procedure, initial encounter: Secondary | ICD-10-CM | POA: Diagnosis not present

## 2016-03-29 DIAGNOSIS — D696 Thrombocytopenia, unspecified: Secondary | ICD-10-CM | POA: Diagnosis not present

## 2016-03-29 DIAGNOSIS — S065X9A Traumatic subdural hemorrhage with loss of consciousness of unspecified duration, initial encounter: Secondary | ICD-10-CM

## 2016-03-29 DIAGNOSIS — S27329A Contusion of lung, unspecified, initial encounter: Secondary | ICD-10-CM | POA: Diagnosis not present

## 2016-03-29 DIAGNOSIS — D62 Acute posthemorrhagic anemia: Secondary | ICD-10-CM | POA: Diagnosis not present

## 2016-03-29 DIAGNOSIS — Z6826 Body mass index (BMI) 26.0-26.9, adult: Secondary | ICD-10-CM

## 2016-03-29 DIAGNOSIS — K5901 Slow transit constipation: Secondary | ICD-10-CM | POA: Insufficient documentation

## 2016-03-29 DIAGNOSIS — S62315B Displaced fracture of base of fourth metacarpal bone, left hand, initial encounter for open fracture: Secondary | ICD-10-CM | POA: Diagnosis not present

## 2016-03-29 DIAGNOSIS — S065X9D Traumatic subdural hemorrhage with loss of consciousness of unspecified duration, subsequent encounter: Secondary | ICD-10-CM | POA: Diagnosis not present

## 2016-03-29 DIAGNOSIS — I255 Ischemic cardiomyopathy: Secondary | ICD-10-CM | POA: Diagnosis not present

## 2016-03-29 DIAGNOSIS — I272 Other secondary pulmonary hypertension: Secondary | ICD-10-CM | POA: Diagnosis present

## 2016-03-29 DIAGNOSIS — S2241XA Multiple fractures of ribs, right side, initial encounter for closed fracture: Secondary | ICD-10-CM | POA: Diagnosis present

## 2016-03-29 DIAGNOSIS — S1093XA Contusion of unspecified part of neck, initial encounter: Secondary | ICD-10-CM | POA: Diagnosis present

## 2016-03-29 DIAGNOSIS — S62321D Displaced fracture of shaft of second metacarpal bone, left hand, subsequent encounter for fracture with routine healing: Secondary | ICD-10-CM | POA: Diagnosis not present

## 2016-03-29 DIAGNOSIS — T1490XA Injury, unspecified, initial encounter: Secondary | ICD-10-CM

## 2016-03-29 DIAGNOSIS — S1083XA Contusion of other specified part of neck, initial encounter: Secondary | ICD-10-CM | POA: Diagnosis not present

## 2016-03-29 DIAGNOSIS — I110 Hypertensive heart disease with heart failure: Secondary | ICD-10-CM | POA: Diagnosis present

## 2016-03-29 DIAGNOSIS — L089 Local infection of the skin and subcutaneous tissue, unspecified: Secondary | ICD-10-CM | POA: Diagnosis not present

## 2016-03-29 DIAGNOSIS — J441 Chronic obstructive pulmonary disease with (acute) exacerbation: Secondary | ICD-10-CM | POA: Diagnosis present

## 2016-03-29 DIAGNOSIS — R402412 Glasgow coma scale score 13-15, at arrival to emergency department: Secondary | ICD-10-CM | POA: Diagnosis present

## 2016-03-29 DIAGNOSIS — S62397A Other fracture of fifth metacarpal bone, left hand, initial encounter for closed fracture: Secondary | ICD-10-CM | POA: Diagnosis present

## 2016-03-29 DIAGNOSIS — M810 Age-related osteoporosis without current pathological fracture: Secondary | ICD-10-CM | POA: Diagnosis present

## 2016-03-29 DIAGNOSIS — F1010 Alcohol abuse, uncomplicated: Secondary | ICD-10-CM | POA: Diagnosis present

## 2016-03-29 DIAGNOSIS — Z8249 Family history of ischemic heart disease and other diseases of the circulatory system: Secondary | ICD-10-CM

## 2016-03-29 DIAGNOSIS — S2231XA Fracture of one rib, right side, initial encounter for closed fracture: Secondary | ICD-10-CM | POA: Diagnosis not present

## 2016-03-29 DIAGNOSIS — I214 Non-ST elevation (NSTEMI) myocardial infarction: Secondary | ICD-10-CM | POA: Diagnosis present

## 2016-03-29 DIAGNOSIS — E876 Hypokalemia: Secondary | ICD-10-CM | POA: Diagnosis not present

## 2016-03-29 DIAGNOSIS — I469 Cardiac arrest, cause unspecified: Secondary | ICD-10-CM | POA: Diagnosis not present

## 2016-03-29 DIAGNOSIS — S62102B Fracture of unspecified carpal bone, left wrist, initial encounter for open fracture: Secondary | ICD-10-CM | POA: Diagnosis not present

## 2016-03-29 DIAGNOSIS — Z452 Encounter for adjustment and management of vascular access device: Secondary | ICD-10-CM

## 2016-03-29 DIAGNOSIS — S62613A Displaced fracture of proximal phalanx of left middle finger, initial encounter for closed fracture: Secondary | ICD-10-CM | POA: Diagnosis not present

## 2016-03-29 DIAGNOSIS — J939 Pneumothorax, unspecified: Secondary | ICD-10-CM

## 2016-03-29 DIAGNOSIS — S06890D Other specified intracranial injury without loss of consciousness, subsequent encounter: Secondary | ICD-10-CM | POA: Diagnosis not present

## 2016-03-29 DIAGNOSIS — E871 Hypo-osmolality and hyponatremia: Secondary | ICD-10-CM | POA: Diagnosis not present

## 2016-03-29 DIAGNOSIS — I5042 Chronic combined systolic (congestive) and diastolic (congestive) heart failure: Secondary | ICD-10-CM | POA: Diagnosis present

## 2016-03-29 DIAGNOSIS — Y92410 Unspecified street and highway as the place of occurrence of the external cause: Secondary | ICD-10-CM | POA: Diagnosis not present

## 2016-03-29 DIAGNOSIS — S2221XD Fracture of manubrium, subsequent encounter for fracture with routine healing: Secondary | ICD-10-CM | POA: Diagnosis not present

## 2016-03-29 DIAGNOSIS — S069X2S Unspecified intracranial injury with loss of consciousness of 31 minutes to 59 minutes, sequela: Secondary | ICD-10-CM | POA: Diagnosis not present

## 2016-03-29 DIAGNOSIS — L8990 Pressure ulcer of unspecified site, unspecified stage: Secondary | ICD-10-CM | POA: Diagnosis present

## 2016-03-29 DIAGNOSIS — I5032 Chronic diastolic (congestive) heart failure: Secondary | ICD-10-CM | POA: Diagnosis not present

## 2016-03-29 DIAGNOSIS — E872 Acidosis: Secondary | ICD-10-CM | POA: Diagnosis present

## 2016-03-29 DIAGNOSIS — I951 Orthostatic hypotension: Secondary | ICD-10-CM | POA: Diagnosis not present

## 2016-03-29 DIAGNOSIS — S225XXD Flail chest, subsequent encounter for fracture with routine healing: Secondary | ICD-10-CM | POA: Diagnosis not present

## 2016-03-29 DIAGNOSIS — S6292XB Unspecified fracture of left wrist and hand, initial encounter for open fracture: Secondary | ICD-10-CM

## 2016-03-29 DIAGNOSIS — S62398A Other fracture of other metacarpal bone, initial encounter for closed fracture: Secondary | ICD-10-CM | POA: Diagnosis present

## 2016-03-29 DIAGNOSIS — S069X1S Unspecified intracranial injury with loss of consciousness of 30 minutes or less, sequela: Secondary | ICD-10-CM | POA: Diagnosis not present

## 2016-03-29 DIAGNOSIS — R739 Hyperglycemia, unspecified: Secondary | ICD-10-CM | POA: Diagnosis present

## 2016-03-29 DIAGNOSIS — I712 Thoracic aortic aneurysm, without rupture: Secondary | ICD-10-CM | POA: Diagnosis not present

## 2016-03-29 DIAGNOSIS — J449 Chronic obstructive pulmonary disease, unspecified: Secondary | ICD-10-CM | POA: Diagnosis not present

## 2016-03-29 DIAGNOSIS — S92322A Displaced fracture of second metatarsal bone, left foot, initial encounter for closed fracture: Secondary | ICD-10-CM | POA: Diagnosis not present

## 2016-03-29 DIAGNOSIS — S62353D Nondisplaced fracture of shaft of third metacarpal bone, left hand, subsequent encounter for fracture with routine healing: Secondary | ICD-10-CM | POA: Diagnosis not present

## 2016-03-29 DIAGNOSIS — R918 Other nonspecific abnormal finding of lung field: Secondary | ICD-10-CM | POA: Diagnosis not present

## 2016-03-29 DIAGNOSIS — I739 Peripheral vascular disease, unspecified: Secondary | ICD-10-CM | POA: Diagnosis present

## 2016-03-29 DIAGNOSIS — S2221XA Fracture of manubrium, initial encounter for closed fracture: Secondary | ICD-10-CM | POA: Diagnosis not present

## 2016-03-29 DIAGNOSIS — Z87891 Personal history of nicotine dependence: Secondary | ICD-10-CM

## 2016-03-29 DIAGNOSIS — S069X9A Unspecified intracranial injury with loss of consciousness of unspecified duration, initial encounter: Secondary | ICD-10-CM | POA: Insufficient documentation

## 2016-03-29 DIAGNOSIS — S62391B Other fracture of second metacarpal bone, left hand, initial encounter for open fracture: Secondary | ICD-10-CM | POA: Diagnosis present

## 2016-03-29 DIAGNOSIS — K5900 Constipation, unspecified: Secondary | ICD-10-CM | POA: Diagnosis not present

## 2016-03-29 DIAGNOSIS — S62304A Unspecified fracture of fourth metacarpal bone, right hand, initial encounter for closed fracture: Secondary | ICD-10-CM | POA: Diagnosis not present

## 2016-03-29 DIAGNOSIS — R570 Cardiogenic shock: Secondary | ICD-10-CM | POA: Diagnosis not present

## 2016-03-29 DIAGNOSIS — Z8781 Personal history of (healed) traumatic fracture: Secondary | ICD-10-CM

## 2016-03-29 DIAGNOSIS — Z4502 Encounter for adjustment and management of automatic implantable cardiac defibrillator: Secondary | ICD-10-CM | POA: Diagnosis not present

## 2016-03-29 DIAGNOSIS — E669 Obesity, unspecified: Secondary | ICD-10-CM | POA: Diagnosis present

## 2016-03-29 DIAGNOSIS — S62395A Other fracture of fourth metacarpal bone, left hand, initial encounter for closed fracture: Secondary | ICD-10-CM | POA: Diagnosis present

## 2016-03-29 DIAGNOSIS — I420 Dilated cardiomyopathy: Secondary | ICD-10-CM | POA: Diagnosis present

## 2016-03-29 DIAGNOSIS — T148XXA Other injury of unspecified body region, initial encounter: Secondary | ICD-10-CM

## 2016-03-29 DIAGNOSIS — I5023 Acute on chronic systolic (congestive) heart failure: Secondary | ICD-10-CM | POA: Diagnosis not present

## 2016-03-29 DIAGNOSIS — S62302A Unspecified fracture of third metacarpal bone, right hand, initial encounter for closed fracture: Secondary | ICD-10-CM | POA: Diagnosis not present

## 2016-03-29 DIAGNOSIS — I252 Old myocardial infarction: Secondary | ICD-10-CM

## 2016-03-29 DIAGNOSIS — I10 Essential (primary) hypertension: Secondary | ICD-10-CM | POA: Diagnosis not present

## 2016-03-29 DIAGNOSIS — I509 Heart failure, unspecified: Secondary | ICD-10-CM | POA: Diagnosis not present

## 2016-03-29 DIAGNOSIS — S3993XA Unspecified injury of pelvis, initial encounter: Secondary | ICD-10-CM | POA: Diagnosis not present

## 2016-03-29 DIAGNOSIS — S62327A Displaced fracture of shaft of fifth metacarpal bone, left hand, initial encounter for closed fracture: Secondary | ICD-10-CM | POA: Diagnosis not present

## 2016-03-29 DIAGNOSIS — Z95810 Presence of automatic (implantable) cardiac defibrillator: Secondary | ICD-10-CM

## 2016-03-29 DIAGNOSIS — Z87898 Personal history of other specified conditions: Secondary | ICD-10-CM

## 2016-03-29 DIAGNOSIS — S069XAA Unspecified intracranial injury with loss of consciousness status unknown, initial encounter: Secondary | ICD-10-CM | POA: Insufficient documentation

## 2016-03-29 DIAGNOSIS — Z9889 Other specified postprocedural states: Secondary | ICD-10-CM | POA: Insufficient documentation

## 2016-03-29 DIAGNOSIS — I421 Obstructive hypertrophic cardiomyopathy: Secondary | ICD-10-CM | POA: Diagnosis not present

## 2016-03-29 DIAGNOSIS — S0990XA Unspecified injury of head, initial encounter: Secondary | ICD-10-CM | POA: Diagnosis not present

## 2016-03-29 DIAGNOSIS — R350 Frequency of micturition: Secondary | ICD-10-CM | POA: Diagnosis not present

## 2016-03-29 DIAGNOSIS — J9601 Acute respiratory failure with hypoxia: Secondary | ICD-10-CM | POA: Diagnosis present

## 2016-03-29 DIAGNOSIS — S62353A Nondisplaced fracture of shaft of third metacarpal bone, left hand, initial encounter for closed fracture: Secondary | ICD-10-CM | POA: Diagnosis not present

## 2016-03-29 DIAGNOSIS — F419 Anxiety disorder, unspecified: Secondary | ICD-10-CM | POA: Diagnosis not present

## 2016-03-29 DIAGNOSIS — S270XXD Traumatic pneumothorax, subsequent encounter: Secondary | ICD-10-CM | POA: Diagnosis not present

## 2016-03-29 DIAGNOSIS — R7989 Other specified abnormal findings of blood chemistry: Secondary | ICD-10-CM | POA: Diagnosis present

## 2016-03-29 DIAGNOSIS — Z7951 Long term (current) use of inhaled steroids: Secondary | ICD-10-CM

## 2016-03-29 DIAGNOSIS — S62325D Displaced fracture of shaft of fourth metacarpal bone, left hand, subsequent encounter for fracture with routine healing: Secondary | ICD-10-CM | POA: Diagnosis not present

## 2016-03-29 DIAGNOSIS — S62355A Nondisplaced fracture of shaft of fourth metacarpal bone, left hand, initial encounter for closed fracture: Secondary | ICD-10-CM | POA: Diagnosis not present

## 2016-03-29 DIAGNOSIS — T148 Other injury of unspecified body region: Secondary | ICD-10-CM | POA: Diagnosis not present

## 2016-03-29 DIAGNOSIS — R339 Retention of urine, unspecified: Secondary | ICD-10-CM | POA: Diagnosis not present

## 2016-03-29 DIAGNOSIS — T85628D Displacement of other specified internal prosthetic devices, implants and grafts, subsequent encounter: Secondary | ICD-10-CM | POA: Diagnosis not present

## 2016-03-29 DIAGNOSIS — S065XAA Traumatic subdural hemorrhage with loss of consciousness status unknown, initial encounter: Secondary | ICD-10-CM

## 2016-03-29 HISTORY — DX: Presence of cardiac pacemaker: Z95.0

## 2016-03-29 HISTORY — DX: Ventricular tachycardia: I47.2

## 2016-03-29 LAB — TROPONIN I
TROPONIN I: 0.17 ng/mL — AB (ref <0.031)
TROPONIN I: 2.01 ng/mL — AB (ref <0.031)
TROPONIN I: 5.7 ng/mL — AB (ref <0.031)
Troponin I: 4.87 ng/mL (ref <0.031)

## 2016-03-29 LAB — ABO/RH: ABO/RH(D): A POS

## 2016-03-29 LAB — CBC
HEMATOCRIT: 42.3 % (ref 39.0–52.0)
Hemoglobin: 14.4 g/dL (ref 13.0–17.0)
MCH: 35 pg — AB (ref 26.0–34.0)
MCHC: 34 g/dL (ref 30.0–36.0)
MCV: 102.7 fL — AB (ref 78.0–100.0)
PLATELETS: 88 10*3/uL — AB (ref 150–400)
RBC: 4.12 MIL/uL — ABNORMAL LOW (ref 4.22–5.81)
RDW: 12.7 % (ref 11.5–15.5)
WBC: 14.1 10*3/uL — ABNORMAL HIGH (ref 4.0–10.5)

## 2016-03-29 LAB — COMPREHENSIVE METABOLIC PANEL
ALBUMIN: 3.3 g/dL — AB (ref 3.5–5.0)
ALT: 44 U/L (ref 17–63)
AST: 87 U/L — AB (ref 15–41)
Alkaline Phosphatase: 58 U/L (ref 38–126)
Anion gap: 20 — ABNORMAL HIGH (ref 5–15)
BUN: 12 mg/dL (ref 6–20)
CHLORIDE: 100 mmol/L — AB (ref 101–111)
CO2: 14 mmol/L — AB (ref 22–32)
CREATININE: 1.2 mg/dL (ref 0.61–1.24)
Calcium: 8.7 mg/dL — ABNORMAL LOW (ref 8.9–10.3)
GFR calc Af Amer: >60 mL/min (ref >60)
GFR calc non Af Amer: 54 mL/min — ABNORMAL LOW (ref >60)
GLUCOSE: 265 mg/dL — AB (ref 65–99)
POTASSIUM: 4 mmol/L (ref 3.5–5.1)
SODIUM: 134 mmol/L — AB (ref 135–145)
Total Bilirubin: 1.1 mg/dL (ref 0.3–1.2)
Total Protein: 6 g/dL — ABNORMAL LOW (ref 6.5–8.1)

## 2016-03-29 LAB — URINE MICROSCOPIC-ADD ON

## 2016-03-29 LAB — MAGNESIUM: Magnesium: 1.5 mg/dL — ABNORMAL LOW (ref 1.7–2.4)

## 2016-03-29 LAB — I-STAT CHEM 8, ED
BUN: 14 mg/dL (ref 6–20)
CALCIUM ION: 1 mmol/L — AB (ref 1.13–1.30)
Chloride: 101 mmol/L (ref 101–111)
Creatinine, Ser: 0.9 mg/dL (ref 0.61–1.24)
GLUCOSE: 266 mg/dL — AB (ref 65–99)
HCT: 45 % (ref 39.0–52.0)
Hemoglobin: 15.3 g/dL (ref 13.0–17.0)
Potassium: 3.8 mmol/L (ref 3.5–5.1)
SODIUM: 134 mmol/L — AB (ref 135–145)
TCO2: 16 mmol/L (ref 0–100)

## 2016-03-29 LAB — I-STAT ARTERIAL BLOOD GAS, ED
ACID-BASE DEFICIT: 12 mmol/L — AB (ref 0.0–2.0)
BICARBONATE: 13.8 meq/L — AB (ref 20.0–24.0)
O2 Saturation: 100 %
PH ART: 7.283 — AB (ref 7.350–7.450)
TCO2: 15 mmol/L (ref 0–100)
pCO2 arterial: 29.2 mmHg — ABNORMAL LOW (ref 35.0–45.0)
pO2, Arterial: 296 mmHg — ABNORMAL HIGH (ref 80.0–100.0)

## 2016-03-29 LAB — GLUCOSE, CAPILLARY
GLUCOSE-CAPILLARY: 147 mg/dL — AB (ref 65–99)
GLUCOSE-CAPILLARY: 131 mg/dL — AB (ref 65–99)
Glucose-Capillary: 115 mg/dL — ABNORMAL HIGH (ref 65–99)
Glucose-Capillary: 123 mg/dL — ABNORMAL HIGH (ref 65–99)

## 2016-03-29 LAB — CDS SEROLOGY

## 2016-03-29 LAB — I-STAT TROPONIN, ED: Troponin i, poc: 0.36 ng/mL (ref 0.00–0.08)

## 2016-03-29 LAB — PROTIME-INR
INR: 1.28 (ref 0.00–1.49)
Prothrombin Time: 16.2 seconds — ABNORMAL HIGH (ref 11.6–15.2)

## 2016-03-29 LAB — URINALYSIS, ROUTINE W REFLEX MICROSCOPIC
BILIRUBIN URINE: NEGATIVE
GLUCOSE, UA: NEGATIVE mg/dL
KETONES UR: NEGATIVE mg/dL
Leukocytes, UA: NEGATIVE
Nitrite: NEGATIVE
PH: 6 (ref 5.0–8.0)
Protein, ur: NEGATIVE mg/dL
SPECIFIC GRAVITY, URINE: 1.034 — AB (ref 1.005–1.030)

## 2016-03-29 LAB — ETHANOL: Alcohol, Ethyl (B): <5 mg/dL (ref <5)

## 2016-03-29 LAB — MRSA PCR SCREENING: MRSA BY PCR: NEGATIVE

## 2016-03-29 LAB — LACTIC ACID, PLASMA
LACTIC ACID, VENOUS: 1.8 mmol/L (ref 0.5–2.0)
Lactic Acid, Venous: 3.1 mmol/L (ref 0.5–2.0)

## 2016-03-29 LAB — ACETAMINOPHEN LEVEL: Acetaminophen (Tylenol), Serum: <10 ug/mL — ABNORMAL LOW (ref 10–30)

## 2016-03-29 LAB — BLOOD PRODUCT ORDER (VERBAL) VERIFICATION

## 2016-03-29 LAB — I-STAT CG4 LACTIC ACID, ED: Lactic Acid, Venous: 11.67 mmol/L (ref 0.5–2.0)

## 2016-03-29 LAB — SALICYLATE LEVEL

## 2016-03-29 MED ORDER — SUCCINYLCHOLINE CHLORIDE 20 MG/ML IJ SOLN
INTRAMUSCULAR | Status: AC | PRN
  Administered 2016-03-29: 100 mg via INTRAVENOUS

## 2016-03-29 MED ORDER — MIDAZOLAM HCL 2 MG/2ML IJ SOLN: 1.0000 mg | INTRAMUSCULAR | Status: DC | PRN

## 2016-03-29 MED ORDER — AMIODARONE LOAD VIA INFUSION
150.0000 mg | Freq: Once | INTRAVENOUS | Status: AC
  Administered 2016-03-29: 150 mg via INTRAVENOUS
  Filled 2016-03-29: qty 83.34

## 2016-03-29 MED ORDER — FENTANYL CITRATE (PF) 100 MCG/2ML IJ SOLN: 50.0000 ug | Freq: Once | INTRAMUSCULAR | Status: DC

## 2016-03-29 MED ORDER — MAGNESIUM SULFATE 2 GM/50ML IV SOLN
2.0000 g | Freq: Once | INTRAVENOUS | Status: AC
  Administered 2016-03-29: 2 g via INTRAVENOUS
  Filled 2016-03-29: qty 50

## 2016-03-29 MED ORDER — PROPOFOL 1000 MG/100ML IV EMUL
5.0000 ug/kg/min | Freq: Once | INTRAVENOUS | Status: AC
  Administered 2016-03-29: 10 ug/kg/min via INTRAVENOUS

## 2016-03-29 MED ORDER — AMIODARONE HCL IN DEXTROSE 360-4.14 MG/200ML-% IV SOLN
30.0000 mg/h | INTRAVENOUS | Status: DC
  Administered 2016-03-30 – 2016-04-01 (×6): 30 mg/h via INTRAVENOUS
  Filled 2016-03-29 (×5): qty 200

## 2016-03-29 MED ORDER — AMIODARONE HCL IN DEXTROSE 360-4.14 MG/200ML-% IV SOLN
60.0000 mg/h | INTRAVENOUS | Status: AC
  Administered 2016-03-29: 60 mg/h via INTRAVENOUS
  Filled 2016-03-29 (×2): qty 200

## 2016-03-29 MED ORDER — FENTANYL CITRATE (PF) 100 MCG/2ML IJ SOLN
INTRAMUSCULAR | Status: AC
  Filled 2016-03-29: qty 2

## 2016-03-29 MED ORDER — DEXTROSE 5 % IV SOLN
150.0000 mg | INTRAVENOUS | Status: DC | PRN
  Administered 2016-03-29: 150 mg via INTRAVENOUS

## 2016-03-29 MED ORDER — METOPROLOL TARTRATE 5 MG/5ML IV SOLN
5.0000 mg | Freq: Four times a day (QID) | INTRAVENOUS | Status: DC
  Administered 2016-03-29 – 2016-04-01 (×10): 5 mg via INTRAVENOUS
  Filled 2016-03-29 (×10): qty 5

## 2016-03-29 MED ORDER — SODIUM CHLORIDE 0.9 % IV SOLN: 250.0000 mL | INTRAVENOUS | Status: DC | PRN

## 2016-03-29 MED ORDER — INSULIN ASPART 100 UNIT/ML ~~LOC~~ SOLN
0.0000 [IU] | SUBCUTANEOUS | Status: DC
  Administered 2016-03-29 – 2016-03-31 (×6): 2 [IU] via SUBCUTANEOUS

## 2016-03-29 MED ORDER — FENTANYL BOLUS VIA INFUSION
25.0000 ug | INTRAVENOUS | Status: DC | PRN
Start: 2016-03-29 — End: 2016-03-31
  Filled 2016-03-29: qty 25

## 2016-03-29 MED ORDER — CHLORHEXIDINE GLUCONATE 0.12% ORAL RINSE (MEDLINE KIT)
15.0000 mL | Freq: Two times a day (BID) | OROMUCOSAL | Status: DC
  Administered 2016-03-29 – 2016-03-31 (×4): 15 mL via OROMUCOSAL

## 2016-03-29 MED ORDER — FAMOTIDINE IN NACL 20-0.9 MG/50ML-% IV SOLN
20.0000 mg | Freq: Two times a day (BID) | INTRAVENOUS | Status: DC
  Administered 2016-03-29 – 2016-04-01 (×5): 20 mg via INTRAVENOUS
  Filled 2016-03-29 (×5): qty 50

## 2016-03-29 MED ORDER — DEXTROSE 5 % IV SOLN
0.0000 ug/min | INTRAVENOUS | Status: DC
  Administered 2016-03-29: 10 ug/min via INTRAVENOUS
  Filled 2016-03-29: qty 1

## 2016-03-29 MED ORDER — ROCURONIUM BROMIDE 50 MG/5ML IV SOLN
INTRAVENOUS | Status: DC | PRN
  Administered 2016-03-29: 50 mg via INTRAVENOUS

## 2016-03-29 MED ORDER — ANTISEPTIC ORAL RINSE SOLUTION (CORINZ)
7.0000 mL | OROMUCOSAL | Status: DC
  Administered 2016-03-29 – 2016-03-31 (×18): 7 mL via OROMUCOSAL

## 2016-03-29 MED ORDER — ETOMIDATE 2 MG/ML IV SOLN
INTRAVENOUS | Status: AC | PRN
  Administered 2016-03-29: 20 mg via INTRAVENOUS

## 2016-03-29 MED ORDER — THIAMINE HCL 100 MG/ML IJ SOLN
100.0000 mg | Freq: Every day | INTRAMUSCULAR | Status: DC
  Administered 2016-03-29 – 2016-04-04 (×7): 100 mg via INTRAVENOUS
  Filled 2016-03-29 (×7): qty 2

## 2016-03-29 MED ORDER — SODIUM CHLORIDE 0.9 % IV BOLUS (SEPSIS)
1000.0000 mL | Freq: Once | INTRAVENOUS | Status: AC
  Administered 2016-03-29: 1000 mL via INTRAVENOUS

## 2016-03-29 MED ORDER — CEFAZOLIN SODIUM 1-5 GM-% IV SOLN
1.0000 g | Freq: Three times a day (TID) | INTRAVENOUS | Status: DC
  Administered 2016-03-29 – 2016-04-09 (×32): 1 g via INTRAVENOUS
  Filled 2016-03-29 (×38): qty 50

## 2016-03-29 MED ORDER — FENTANYL CITRATE (PF) 100 MCG/2ML IJ SOLN
INTRAMUSCULAR | Status: DC | PRN
  Administered 2016-03-29: 50 ug via INTRAVENOUS
  Administered 2016-03-29: 25 ug via INTRAVENOUS
  Administered 2016-03-29: 50 ug via INTRAVENOUS

## 2016-03-29 MED ORDER — IOPAMIDOL (ISOVUE-300) INJECTION 61%
INTRAVENOUS | Status: AC
  Administered 2016-03-29: 100 mL
  Filled 2016-03-29: qty 100

## 2016-03-29 MED ORDER — PROPOFOL 1000 MG/100ML IV EMUL
INTRAVENOUS | Status: AC
  Filled 2016-03-29: qty 100

## 2016-03-29 MED ORDER — SODIUM CHLORIDE 0.9 % IV SOLN
25.0000 ug/h | INTRAVENOUS | Status: DC
  Administered 2016-03-29: 50 ug/h via INTRAVENOUS
  Administered 2016-03-30: 100 ug/h via INTRAVENOUS
  Filled 2016-03-29 (×2): qty 50

## 2016-03-29 MED ORDER — IPRATROPIUM-ALBUTEROL 0.5-2.5 (3) MG/3ML IN SOLN
3.0000 mL | Freq: Four times a day (QID) | RESPIRATORY_TRACT | Status: DC
  Administered 2016-03-29 – 2016-03-31 (×8): 3 mL via RESPIRATORY_TRACT
  Filled 2016-03-29 (×9): qty 3

## 2016-03-29 NOTE — ED Notes (Signed)
Repeat FAST: negative

## 2016-03-29 NOTE — Progress Notes (Signed)
CRITICAL VALUE ALERT  Critical value received:  Lactic Acid 3.1. Troponin 2.01  Date of notification:  03/29/16  Time of notification: 1233  Critical value read back:Yes.    Nurse who received alert:  Caro Hight, RN   MD notified (1st page): Dr. Lamonte Sakai  Time of first page:  1235  Responding MD:  Dr. Lamonte Sakai  Time MD responded:  [phone]

## 2016-03-29 NOTE — Progress Notes (Signed)
Orthopedic Tech Progress Note Patient Details:  [name] 1931-03-24 [id number]  Ortho Devices Type of Ortho Device: Ace wrap, Volar splint Ortho Device/Splint Location: LUE  Ortho Device/Splint Interventions: Ordered, Application   [name] 03/29/2016, 4:05 PM

## 2016-03-29 NOTE — Progress Notes (Signed)
Chaplain checked on family and gave spiritual support via prayer. Chaplain will continue to sit with & check on family through out the day.   Dante Gang, Chaplain

## 2016-03-29 NOTE — Procedures (Signed)
Arterial Catheter Insertion Procedure Note [name] [id number] December 06, 1930  Procedure: Insertion of Arterial Catheter  Indications: Blood pressure monitoring and Frequent blood sampling  Procedure Details Consent: Risks of procedure as well as the alternatives and risks of each were explained to the (patient/caregiver).  Consent for procedure obtained. Time Out: Verified patient identification, verified procedure, site/side was marked, verified correct patient position, special equipment/implants available, medications/allergies/relevent history reviewed, required imaging and test results available.  Performed  Maximum sterile technique was used including antiseptics, cap, gloves, gown, hand hygiene, mask and sheet. Skin prep: Chlorhexidine; local anesthetic administered 20 gauge catheter was inserted into right radial artery using the Seldinger technique.  Evaluation Blood flow good; BP tracing good. Complications: No apparent complications  .   [name] 03/29/2016

## 2016-03-29 NOTE — ED Notes (Signed)
FAST exam negative

## 2016-03-29 NOTE — ED Notes (Signed)
Pressures dropping to 50's.  2nd ffp and rbc given.  Neo ordered from pharmacy.  Propofol. Paused. Amio paused.
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
## 2016-03-29 NOTE — ED Notes (Signed)
fAmily at bedside.  Dr Grandville Silos speaking with family.  Cardiology at bedside.

## 2016-03-29 NOTE — ED Notes (Signed)
FFP 1 UNIT GIVEN
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
## 2016-03-29 NOTE — Progress Notes (Signed)
   03/29/16 0900  Clinical Encounter Type  Visited With Family  Visit Type Follow-up;Spiritual support;Social support  Referral From Chaplain  Spiritual Encounters  Spiritual Needs Emotional  Stress Factors  Patient Stress Factors Exhausted;Family relationships;Health changes;Lack of knowledge;Loss;Loss of control;Major life changes  Family Stress Factors Exhausted;Family relationships;Health changes;Lack of knowledge;Loss;Loss of control;Major life changes  Chaplin visited with family while they waited for patient to return from procedure.  Family was distraught and emotional. Family just suffered a loss yesterday and are actively grieving.  Chaplain offered ministry of hospitality and brought family coffee. Chaplain provided ministry of presence.  Chaplain will follow up with family after completion of procedure and throughout the day.  Vilinda Blanks Masaru Chamberlin  03/29/2016  9:15 AM  [id number]

## 2016-03-29 NOTE — Progress Notes (Signed)
Orthopedic Tech Progress Note Patient Details:  [name] 01/29/1931 [id number]  Patient ID: [name], male   DOB: 1931/03/31, 82 y.o.   MRN: [id number]   [place] Pink 03/29/2016, 10:37 Haskel Khan one trauma.

## 2016-03-29 NOTE — Progress Notes (Signed)
Attempted at cortrak on pt. Met resistance in the rt nare and then when attempted in the left nare and the tube coiled in the mouth. Pt was upset and didn't want me to continue to try. Notified nurse of attempt.

## 2016-03-29 NOTE — Progress Notes (Deleted)
History & Physical    Patient ID: [name] MRN: [id number], DOB/AGE: [birth year]   Admit date: (Not on file)   Primary Physician: Noralee Space, MD Primary Cardiologist: ***  Patient Profile    ***  Past Medical History    Past Medical History  Diagnosis Date  . Allergic rhinitis   . COPD (chronic obstructive pulmonary disease) ([place])   . Tobacco abuse   . Hypertensive heart disease   . Atherosclerotic heart disease   . Ischemic cardiomyopathy     a. EF prev <35%-->improved to normal by Echo 8/14:  Mild LVH, focal basal hypertrophy, EF 60-65%, normal wall motion, mild BAE, PASP 36.  Marland Kitchen Syncope   . Presence of cardiac defibrillator     a. 03/2008 s/p MDT [id number] Maximo II DR, DC AICD; b. 03/2013: ICD shock for T wave oversensing;  c. 10/2015: collective decision not to replace ICD given improvement in LV fxn.  . Peripheral vascular disease ([place])   . Hypercholesterolemia   . Diverticulosis of colon   . History of colonic polyps   . Hemorrhoids   . Abnormal liver function tests   . History of gout   . Back pain   . Compression fracture   . Osteoporosis   . Dermatitis   . T wave over sensing resulting in inappropriate shocks     a. 03/2013.  Marland Kitchen DJD (degenerative joint disease)     and Gout  . CAD (coronary artery disease)     a. 02/2002 H/o MI with stenting x 2; b. 04/2013 MV: EF 25%, large posterior lateral infarct w/o ischemia.  Marland Kitchen History of pneumonia   . Skin cancer     shoulders and forehead  . Carotid arterial disease ([place])     a. 12/2010 s/p R CEA;  b. 05/2015 Carotid U/S: bilat <40% ICA stenosis.  Marland Kitchen AAA (abdominal aortic aneurysm) ([place])     a. 2002 s/p repair.    Past Surgical History  Procedure Laterality Date  . Abdominal aortic aneurysm repair  2002    by Dr. Kellie Simmering  . Cad stent  02/2002    Dr. Percival Spanish  . Aicd placed  03/2008    Dr. Caryl Comes  . Right corotid enderectomy  12/2010    Dr. Scot Dock  . Carotid endarterectomy Right January 02, 2011  . Cardiac  defibrillator placement  2009  . Cardiac catheterization      X 2 stents  . Tonsillectomy    . Excision of lesion left ear Left 12/13/2015  . Ear cyst excision Left 12/13/2015    Procedure: Excision left ear lesion ;  Surgeon: Melissa Montane, MD;  Location: [facility] OR;  Service: ENT;  Laterality: Left;  . Skin split graft Left 12/13/2015    Procedure: with possible skin graft;  Surgeon: Melissa Montane, MD;  Location: [facility];  Service: ENT;  Laterality: Left;     Allergies  No Known Allergies  History of Present Illness    ***  Home Medications    Prior to Admission medications   Medication Sig Start Date End Date Taking? Authorizing Provider  ADVAIR DISKUS 250-50 MCG/DOSE AEPB INHALE 1 PUFF TWICE DAILY. Patient taking differently: INHALE 1 PUFF  DAILY. 11/22/15   Noralee Space, MD  allopurinol (ZYLOPRIM) 100 MG tablet TAKE 1 TABLET ONCE DAILY. 02/14/16   Noralee Space, MD  aspirin 81 MG tablet Take 81 mg by mouth daily. Reported on 12/12/2015    Historical Provider, MD  carvedilol (  COREG) 3.125 MG tablet TAKE 1 TABLET TWICE DAILY WITH A MEAL. 03/05/16   Minus Breeding, MD  cephALEXin (KEFLEX) 500 MG capsule Take 1 capsule (500 mg total) by mouth 3 (three) times daily. 12/14/15   Melissa Montane, MD  cholecalciferol (VITAMIN D) 1000 units tablet Take 1,000 Units by mouth daily.    Historical Provider, MD  lisinopril (PRINIVIL,ZESTRIL) 5 MG tablet TAKE 1 TABLET ONCE DAILY. 03/13/16   Noralee Space, MD  Multiple Vitamin (MULTIVITAMIN) capsule Take 1 capsule by mouth daily.    Historical Provider, MD  Multiple Vitamins-Minerals (PRESERVISION/LUTEIN) CAPS Take 1 capsule by mouth daily.     Historical Provider, MD  psyllium (METAMUCIL) 58.6 % powder Take 1 packet by mouth daily.     Historical Provider, MD  simvastatin (ZOCOR) 40 MG tablet TAKE ONE TABLET AT BEDTIME. 03/20/16   Noralee Space, MD    Family History    Family History  Problem Relation Age of Onset  . Hypertension Father   . Heart disease Father      Heart Disease before age 54  . Hypertension Mother   . Heart disease Mother     Heart Disease before age 55  . Cancer Mother     Social History    Social History   Social History  . Marital Status: Married    Spouse Name: N/A  . Number of Children: 3  . Years of Education: N/A   Occupational History  . laywer     retired   Social History Main Topics  . Smoking status: Former Smoker    Types: Cigars    Quit date: 10/22/2012  . Smokeless tobacco: Never Used  . Alcohol Use: 1.8 oz/week    3 Glasses of wine per week     Comment: weekly  . Drug Use: No  . Sexual Activity: Not on file   Other Topics Concern  . Not on file   Social History Narrative   Lives locally.  Had been living with wife who had become quite ill recently and died on the evening of Apr 20, 2016.     Review of Systems    General:  No chills, fever, night sweats or weight changes.  Cardiovascular:  No chest pain, dyspnea on exertion, edema, orthopnea, palpitations, paroxysmal nocturnal dyspnea. Dermatological: No rash, lesions/masses Respiratory: No cough, dyspnea Urologic: No hematuria, dysuria Abdominal:   No nausea, vomiting, diarrhea, bright red blood per rectum, melena, or hematemesis Neurologic:  No visual changes, wkns, changes in mental status. All other systems reviewed and are otherwise negative except as noted above.  Physical Exam    There were no vitals taken for this visit.  General: Pleasant, NAD Psych: Normal affect. Neuro: Alert and oriented X 3. Moves all extremities spontaneously. HEENT: Normal  Neck: Supple without bruits or JVD. Lungs:  Resp regular and unlabored, CTA. Heart: RRR no s3, s4, or murmurs. Abdomen: Soft, non-tender, non-distended, BS + x 4.  Extremities: No clubbing, cyanosis or edema. DP/PT/Radials 2+ and equal bilaterally.  Labs    Troponin (Point of Care Test) No results for input(s): TROPIPOC in the last 72 hours. No results for input(s): CKTOTAL,  CKMB, TROPONINI in the last 72 hours. Lab Results  Component Value Date   WBC 7.8 12/13/2015   HGB 14.4 12/13/2015   HCT 41.4 12/13/2015   MCV 102.0* 12/13/2015   PLT 84* 12/13/2015   No results for input(s): NA, K, CL, CO2, BUN, CREATININE, CALCIUM, PROT, BILITOT, ALKPHOS, ALT, AST,  GLUCOSE in the last 168 hours.  Invalid input(s): LABALBU Lab Results  Component Value Date   CHOL 127 05/11/2015   HDL 41.70 05/11/2015   LDLCALC 57 05/11/2015   TRIG 140.0 05/11/2015   No results found for: [facility]   Radiology Studies    No results found.  ECG & Cardiac Imaging    ***  Assessment & Plan    ***  Signed, [name], NP 03/29/2016, 10:18 AM

## 2016-03-29 NOTE — Progress Notes (Signed)
eLink Physician-Brief Progress Note Patient Name: [name] DOB: [birth year] MRN: [id number]   Date of Service  03/29/2016  HPI/Events of Note  Family reports that the patient has been drinking heavily over the last 2 weeks and is concerned about ETOH withdrawal. Intubated, ventilated and sedated with Propofol which should cover the patient for possible ETOH withdrawal.  eICU Interventions  Will order: 1. Thiamine 100 mg IV now and Q day.     Intervention Category Intermediate Interventions: Other:  Lysle Dingwall 03/29/2016, 6:52 PM

## 2016-03-29 NOTE — ED Notes (Signed)
50 mcg fentanyl wasted with RN [facility]
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
## 2016-03-29 NOTE — Procedures (Signed)
Central Venous Catheter Insertion Procedure Note [name] [id number] Mar 25, 1931  Procedure: Insertion of Central Venous Catheter Indications: Drug and/or fluid administration and Frequent blood sampling  Procedure Details Consent: Risks of procedure as well as the alternatives and risks of each were explained to the (patient/caregiver).  Consent for procedure obtained. Time Out: Verified patient identification, verified procedure, site/side was marked, verified correct patient position, special equipment/implants available, medications/allergies/relevent history reviewed, required imaging and test results available.  Performed Real time [place] was used to ID and cannulate the vessel Maximum sterile technique was used including antiseptics, cap, gloves, gown, hand hygiene, mask and sheet. Skin prep: Chlorhexidine; local anesthetic administered A antimicrobial bonded/coated triple lumen catheter was placed in the left femoral vein due to multiple attempts, no other available access using the Seldinger technique.   Of note:  The right subclavian vein was initially attempted. Multiple attempts were made w/ good blood return but we were unable to thread the guide wire. Suspect venous stenosis in setting of ICD. Because of this we aborted this attempt.  Evaluation Blood flow good Complications: No apparent complications Patient did tolerate procedure well. Chest X-ray ordered to verify placement.  CXR: pending.  Clementeen Graham 03/29/2016, 2:47 PM  Erick Colace ACNP-BC [place] Pager # [phone] OR # [phone] if no answer
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
## 2016-03-29 NOTE — ED Notes (Signed)
62mcg Fentanyl wasted with RN [facility]
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
## 2016-03-29 NOTE — Progress Notes (Signed)
    He was having 7 beat runs of nonsustained ventricular tachycardia.   - Started metoprolol 5 mg IV every 6 hours  - Bolused amiodarone 150 mg 1 and continue drip  - Administered magnesium IV.  Mental status improving.  Candee Furbish, MD

## 2016-03-29 NOTE — ED Notes (Signed)
1 UNIT RBC GIVEN [id number]
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
## 2016-03-29 NOTE — Progress Notes (Signed)
Orthopedic Tech Progress Note Patient Details:  [name] 1931/03/27 [id number]  Ortho Devices Type of Ortho Device: Arm sling Ortho Device/Splint Location: LUE  Ortho Device/Splint Interventions: Ordered, Application   [place] Pink 03/29/2016, 4:10 PM
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
## 2016-03-30 ENCOUNTER — Inpatient Hospital Stay (HOSPITAL_COMMUNITY): Payer: Medicare Other

## 2016-03-30 DIAGNOSIS — J9601 Acute respiratory failure with hypoxia: Secondary | ICD-10-CM

## 2016-03-30 DIAGNOSIS — I509 Heart failure, unspecified: Secondary | ICD-10-CM

## 2016-03-30 DIAGNOSIS — I472 Ventricular tachycardia: Secondary | ICD-10-CM

## 2016-03-30 LAB — ECHOCARDIOGRAM COMPLETE
FS: 18 % — AB (ref 28–44)
HEIGHTINCHES: 72 in
IVS/LV PW RATIO, ED: 1.05
LA ID, A-P, ES: 48 mm
LA diam end sys: 48 mm
LA diam index: 2.3 cm/m2
LA vol A4C: 67.5 ml
LVELAT: 9.9 cm/s
LVOT area: 2.84 cm2
LVOTD: 19 mm
PW: 12.8 mm — AB (ref 0.6–1.1)
TAPSE: 33.6 mm
TDI e' lateral: 9.9
Weight: 3072.33 oz

## 2016-03-30 LAB — CBC
HEMATOCRIT: 35.3 % — AB (ref 39.0–52.0)
Hemoglobin: 12.2 g/dL — ABNORMAL LOW (ref 13.0–17.0)
MCH: 33.2 pg (ref 26.0–34.0)
MCHC: 34.6 g/dL (ref 30.0–36.0)
MCV: 96.2 fL (ref 78.0–100.0)
PLATELETS: 42 10*3/uL — AB (ref 150–400)
RBC: 3.67 MIL/uL — ABNORMAL LOW (ref 4.22–5.81)
RDW: 15.1 % (ref 11.5–15.5)
WBC: 8.9 10*3/uL (ref 4.0–10.5)

## 2016-03-30 LAB — BASIC METABOLIC PANEL
ANION GAP: 10 (ref 5–15)
BUN: 18 mg/dL (ref 6–20)
CALCIUM: 7.6 mg/dL — AB (ref 8.9–10.3)
CO2: 19 mmol/L — AB (ref 22–32)
CREATININE: 1.03 mg/dL (ref 0.61–1.24)
Chloride: 104 mmol/L (ref 101–111)
GLUCOSE: 113 mg/dL — AB (ref 65–99)
Potassium: 3.9 mmol/L (ref 3.5–5.1)
Sodium: 133 mmol/L — ABNORMAL LOW (ref 135–145)

## 2016-03-30 LAB — PREPARE FRESH FROZEN PLASMA
UNIT DIVISION: 0
Unit division: 0

## 2016-03-30 LAB — BLOOD GAS, ARTERIAL
Acid-base deficit: 4.9 mmol/L — ABNORMAL HIGH (ref 0.0–2.0)
Bicarbonate: 18.2 mEq/L — ABNORMAL LOW (ref 20.0–24.0)
DRAWN BY: 24487
FIO2: 0.4
MECHVT: 620 mL
O2 Saturation: 99 %
PCO2 ART: 25.6 mmHg — AB (ref 35.0–45.0)
PEEP: 5 cmH2O
PO2 ART: 131 mmHg — AB (ref 80.0–100.0)
Patient temperature: 98.6
RATE: 18 resp/min
TCO2: 19 mmol/L (ref 0–100)
pH, Arterial: 7.466 — ABNORMAL HIGH (ref 7.350–7.450)

## 2016-03-30 LAB — TYPE AND SCREEN
ABO/RH(D): A POS
Antibody Screen: NEGATIVE
UNIT DIVISION: 0
Unit division: 0

## 2016-03-30 LAB — GLUCOSE, CAPILLARY
GLUCOSE-CAPILLARY: 129 mg/dL — AB (ref 65–99)
GLUCOSE-CAPILLARY: 126 mg/dL — AB (ref 65–99)
GLUCOSE-CAPILLARY: 110 mg/dL — AB (ref 65–99)
GLUCOSE-CAPILLARY: 120 mg/dL — AB (ref 65–99)
Glucose-Capillary: 110 mg/dL — ABNORMAL HIGH (ref 65–99)
Glucose-Capillary: 135 mg/dL — ABNORMAL HIGH (ref 65–99)

## 2016-03-30 LAB — MAGNESIUM: MAGNESIUM: 1.9 mg/dL (ref 1.7–2.4)

## 2016-03-30 LAB — PHOSPHORUS: PHOSPHORUS: 3.2 mg/dL (ref 2.5–4.6)

## 2016-03-30 MED ORDER — PERFLUTREN LIPID MICROSPHERE
1.0000 mL | INTRAVENOUS | Status: AC | PRN
  Filled 2016-03-30: qty 10

## 2016-03-30 MED ORDER — PERFLUTREN LIPID MICROSPHERE
INTRAVENOUS | Status: AC
  Administered 2016-03-30: 4 mL
  Filled 2016-03-30: qty 10

## 2016-03-30 MED ORDER — HYDROCODONE-ACETAMINOPHEN 5-325 MG PO TABS
1.0000 | ORAL_TABLET | Freq: Four times a day (QID) | ORAL | Status: DC | PRN
  Administered 2016-03-30 – 2016-04-07 (×13): 1 via ORAL
  Filled 2016-03-30 (×14): qty 1

## 2016-03-30 MED ORDER — MORPHINE SULFATE (PF) 2 MG/ML IV SOLN
2.0000 mg | INTRAVENOUS | Status: DC | PRN
  Administered 2016-03-30: 2 mg via INTRAVENOUS
  Administered 2016-04-01 (×2): 4 mg via INTRAVENOUS
  Filled 2016-03-30 (×2): qty 2
  Filled 2016-03-30: qty 1
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
## 2016-03-30 NOTE — Procedures (Signed)
Extubation Procedure Note  Patient Details:   Name: [name] DOB: 1930-12-23 MRN: [id number]   Airway Documentation:     Evaluation  O2 sats: stable throughout Complications: No apparent complications Patient did tolerate procedure well. Bilateral Breath Sounds: Clear   Yes  Extubated patient per MD order. Patient oriented to time and place. Vital signs stable at this time.    [name] 03/30/2016, 10:18 AM
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
## 2016-03-30 NOTE — Progress Notes (Signed)
Wasted 180 ml of Fentanyl in the sink. Witnessed by Otho Najjar, RN and Weston Brass, RN

## 2016-03-30 NOTE — Progress Notes (Signed)
[place] Progress Note Patient Name: Mose Mabe DOB: 11-May-1931 MRN: [id number]   Date of Service  03/30/2016  HPI/Events of Note  Request for PO pain medication instead of Morphine IV.   eICU Interventions  Will order: 1. Norco 5/325 mg PO Q 6 hours PRN.     Intervention Category Intermediate Interventions: Pain - evaluation and management  Sommer,Steven Eugene 03/30/2016, 4:32 PM
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
## 2016-03-30 NOTE — Progress Notes (Signed)
  Echocardiogram 2D Echocardiogram has been performed.  [name] 03/30/2016, 3:19 PM

## 2016-03-30 NOTE — Progress Notes (Signed)
  Echocardiogram 2D Echocardiogram has been performed.  [name] 03/30/2016, 3:20 PM

## 2016-03-30 NOTE — Progress Notes (Signed)
Patient ID: Lum Pfab, male   DOB: July 04, 1931, 82 y.o.   MRN: [id number] No posterior midline cervical tenderness, no pain on AROM, collar D/Cd. I updated his daughter at the bedside. Georganna Skeans, MD, MPH, FACS Trauma: [phone] General Surgery: [phone]

## 2016-03-31 ENCOUNTER — Inpatient Hospital Stay (HOSPITAL_COMMUNITY): Payer: Medicare Other

## 2016-03-31 ENCOUNTER — Encounter (HOSPITAL_COMMUNITY): Payer: Self-pay | Admitting: *Deleted

## 2016-03-31 DIAGNOSIS — I10 Essential (primary) hypertension: Secondary | ICD-10-CM

## 2016-03-31 DIAGNOSIS — S270XXD Traumatic pneumothorax, subsequent encounter: Secondary | ICD-10-CM

## 2016-03-31 LAB — BASIC METABOLIC PANEL
Anion gap: 10 (ref 5–15)
BUN: 16 mg/dL (ref 6–20)
CHLORIDE: 101 mmol/L (ref 101–111)
CO2: 21 mmol/L — AB (ref 22–32)
CREATININE: 0.8 mg/dL (ref 0.61–1.24)
Calcium: 7.9 mg/dL — ABNORMAL LOW (ref 8.9–10.3)
GFR calc non Af Amer: >60 mL/min (ref >60)
Glucose, Bld: 116 mg/dL — ABNORMAL HIGH (ref 65–99)
Potassium: 4.2 mmol/L (ref 3.5–5.1)
Sodium: 132 mmol/L — ABNORMAL LOW (ref 135–145)

## 2016-03-31 LAB — CBC
HCT: 33.8 % — ABNORMAL LOW (ref 39.0–52.0)
HEMOGLOBIN: 11.6 g/dL — AB (ref 13.0–17.0)
MCH: 32.9 pg (ref 26.0–34.0)
MCHC: 34.3 g/dL (ref 30.0–36.0)
MCV: 95.8 fL (ref 78.0–100.0)
PLATELETS: 54 10*3/uL — AB (ref 150–400)
RBC: 3.53 MIL/uL — AB (ref 4.22–5.81)
RDW: 14.7 % (ref 11.5–15.5)
WBC: 9.6 10*3/uL (ref 4.0–10.5)

## 2016-03-31 LAB — GLUCOSE, CAPILLARY
GLUCOSE-CAPILLARY: 103 mg/dL — AB (ref 65–99)
GLUCOSE-CAPILLARY: 111 mg/dL — AB (ref 65–99)
GLUCOSE-CAPILLARY: 125 mg/dL — AB (ref 65–99)
GLUCOSE-CAPILLARY: 113 mg/dL — AB (ref 65–99)

## 2016-03-31 MED ORDER — ONDANSETRON HCL 4 MG/2ML IJ SOLN
4.0000 mg | Freq: Four times a day (QID) | INTRAMUSCULAR | Status: DC | PRN
Start: 2016-03-31 — End: 2016-04-03

## 2016-03-31 MED ORDER — ALBUTEROL SULFATE (2.5 MG/3ML) 0.083% IN NEBU: 2.5000 mg | INHALATION_SOLUTION | RESPIRATORY_TRACT | Status: DC | PRN

## 2016-03-31 MED ORDER — IPRATROPIUM-ALBUTEROL 0.5-2.5 (3) MG/3ML IN SOLN
3.0000 mL | Freq: Two times a day (BID) | RESPIRATORY_TRACT | Status: DC
  Administered 2016-03-31 – 2016-04-02 (×4): 3 mL via RESPIRATORY_TRACT
  Filled 2016-03-31 (×4): qty 3

## 2016-03-31 MED ORDER — ONDANSETRON HCL 4 MG/2ML IJ SOLN
INTRAMUSCULAR | Status: AC
  Administered 2016-03-31: 4 mg
  Filled 2016-03-31: qty 2

## 2016-03-31 MED ORDER — INSULIN ASPART 100 UNIT/ML ~~LOC~~ SOLN
0.0000 [IU] | Freq: Three times a day (TID) | SUBCUTANEOUS | Status: DC
  Administered 2016-04-01: 2 [IU] via SUBCUTANEOUS
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
## 2016-03-31 NOTE — Progress Notes (Signed)
   Subjective:  Patient extubated. Sitting up talking.  Objective:   VITALS:   Filed Vitals:   03/31/16 0800 03/31/16 0816 03/31/16 0900 03/31/16 1000  BP: 156/84  124/104 126/64  Pulse: 80  87 76  Temp: 99.1 F (37.3 C)  99.5 F (37.5 C)   TempSrc: Core (Comment)  Core (Comment)   Resp: 21  31 22   Height:      Weight:      SpO2: 96% 96% 97% 99%    Left hand in splint Fingers wwp   Lab Results  Component Value Date   WBC 9.6 03/31/2016   HGB 11.6* 03/31/2016   HCT 33.8* 03/31/2016   MCV 95.8 03/31/2016   PLT 54* 03/31/2016     Assessment/Plan:      - patient is improving clinically - if patient continues to improve and if primary team feels that he would be stable for surgery of left hand, we can plan for ORIF this coming Wednesday - will follow along   Marianna Payment 03/31/2016, 10:58 AM [phone]
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
## 2016-04-01 ENCOUNTER — Encounter (HOSPITAL_COMMUNITY): Payer: Self-pay | Admitting: *Deleted

## 2016-04-01 ENCOUNTER — Inpatient Hospital Stay (HOSPITAL_COMMUNITY): Payer: Medicare Other

## 2016-04-01 DIAGNOSIS — R739 Hyperglycemia, unspecified: Secondary | ICD-10-CM

## 2016-04-01 DIAGNOSIS — I472 Ventricular tachycardia, unspecified: Secondary | ICD-10-CM

## 2016-04-01 DIAGNOSIS — I420 Dilated cardiomyopathy: Secondary | ICD-10-CM

## 2016-04-01 HISTORY — DX: Ventricular tachycardia: I47.2

## 2016-04-01 HISTORY — DX: Ventricular tachycardia, unspecified: I47.20

## 2016-04-01 LAB — BASIC METABOLIC PANEL
ANION GAP: 6 (ref 5–15)
BUN: 17 mg/dL (ref 6–20)
CALCIUM: 7.8 mg/dL — AB (ref 8.9–10.3)
CO2: 24 mmol/L (ref 22–32)
Chloride: 100 mmol/L — ABNORMAL LOW (ref 101–111)
Creatinine, Ser: 0.92 mg/dL (ref 0.61–1.24)
GFR calc non Af Amer: >60 mL/min (ref >60)
GLUCOSE: 115 mg/dL — AB (ref 65–99)
Potassium: 3.7 mmol/L (ref 3.5–5.1)
SODIUM: 130 mmol/L — AB (ref 135–145)

## 2016-04-01 LAB — CBC
HEMATOCRIT: 29.5 % — AB (ref 39.0–52.0)
Hemoglobin: 10 g/dL — ABNORMAL LOW (ref 13.0–17.0)
MCH: 32.8 pg (ref 26.0–34.0)
MCHC: 33.9 g/dL (ref 30.0–36.0)
MCV: 96.7 fL (ref 78.0–100.0)
PLATELETS: 40 10*3/uL — AB (ref 150–400)
RBC: 3.05 MIL/uL — ABNORMAL LOW (ref 4.22–5.81)
RDW: 14.3 % (ref 11.5–15.5)
WBC: 7.8 10*3/uL (ref 4.0–10.5)

## 2016-04-01 LAB — CULTURE, RESPIRATORY: CULTURE: NORMAL

## 2016-04-01 LAB — GLUCOSE, CAPILLARY
GLUCOSE-CAPILLARY: 119 mg/dL — AB (ref 65–99)
GLUCOSE-CAPILLARY: 100 mg/dL — AB (ref 65–99)
Glucose-Capillary: 121 mg/dL — ABNORMAL HIGH (ref 65–99)

## 2016-04-01 LAB — MAGNESIUM: Magnesium: 1.6 mg/dL — ABNORMAL LOW (ref 1.7–2.4)

## 2016-04-01 LAB — PHOSPHORUS: Phosphorus: 2.2 mg/dL — ABNORMAL LOW (ref 2.5–4.6)

## 2016-04-01 LAB — TSH: TSH: 5.091 u[IU]/mL — AB (ref 0.350–4.500)

## 2016-04-01 MED ORDER — SALINE SPRAY 0.65 % NA SOLN
1.0000 | NASAL | Status: DC | PRN
  Filled 2016-04-01: qty 44

## 2016-04-01 MED ORDER — GUAIFENESIN ER 600 MG PO TB12
1200.0000 mg | ORAL_TABLET | Freq: Two times a day (BID) | ORAL | Status: DC
  Administered 2016-04-01 – 2016-04-09 (×16): 1200 mg via ORAL
  Filled 2016-04-01 (×16): qty 2

## 2016-04-01 MED ORDER — BISACODYL 10 MG RE SUPP
10.0000 mg | Freq: Every day | RECTAL | Status: DC | PRN
  Administered 2016-04-04: 10 mg via RECTAL
  Filled 2016-04-01: qty 1

## 2016-04-01 MED ORDER — FAMOTIDINE 20 MG PO TABS: 20.0000 mg | ORAL_TABLET | Freq: Two times a day (BID) | ORAL | Status: DC

## 2016-04-01 MED ORDER — AMIODARONE HCL 200 MG PO TABS
400.0000 mg | ORAL_TABLET | Freq: Two times a day (BID) | ORAL | Status: DC
  Administered 2016-04-01 – 2016-04-09 (×16): 400 mg via ORAL
  Filled 2016-04-01 (×17): qty 2

## 2016-04-01 MED ORDER — POLYETHYLENE GLYCOL 3350 17 G PO PACK
17.0000 g | PACK | Freq: Every day | ORAL | Status: DC
  Administered 2016-04-01 – 2016-04-07 (×6): 17 g via ORAL
  Filled 2016-04-01 (×9): qty 1

## 2016-04-01 MED ORDER — CARVEDILOL 3.125 MG PO TABS
3.1250 mg | ORAL_TABLET | Freq: Two times a day (BID) | ORAL | Status: DC
  Administered 2016-04-01 – 2016-04-04 (×5): 3.125 mg via ORAL
  Filled 2016-04-01 (×5): qty 1

## 2016-04-01 MED ORDER — SENNOSIDES-DOCUSATE SODIUM 8.6-50 MG PO TABS
1.0000 | ORAL_TABLET | Freq: Two times a day (BID) | ORAL | Status: DC
  Administered 2016-04-01 – 2016-04-07 (×11): 1 via ORAL
  Filled 2016-04-01 (×15): qty 1

## 2016-04-01 MED ORDER — SIMVASTATIN 40 MG PO TABS
40.0000 mg | ORAL_TABLET | Freq: Every day | ORAL | Status: DC
  Administered 2016-04-01 – 2016-04-02 (×2): 40 mg via ORAL
  Filled 2016-04-01 (×2): qty 1

## 2016-04-01 MED ORDER — ALLOPURINOL 100 MG PO TABS
100.0000 mg | ORAL_TABLET | Freq: Every day | ORAL | Status: DC
  Administered 2016-04-01 – 2016-04-09 (×9): 100 mg via ORAL
  Filled 2016-04-01 (×9): qty 1

## 2016-04-01 MED ORDER — ALBUTEROL SULFATE (2.5 MG/3ML) 0.083% IN NEBU
2.5000 mg | INHALATION_SOLUTION | RESPIRATORY_TRACT | Status: DC | PRN
  Administered 2016-04-02 – 2016-04-05 (×2): 2.5 mg via RESPIRATORY_TRACT
  Filled 2016-04-01 (×2): qty 3

## 2016-04-01 MED ORDER — LISINOPRIL 5 MG PO TABS
5.0000 mg | ORAL_TABLET | Freq: Every day | ORAL | Status: DC
  Administered 2016-04-01 – 2016-04-04 (×4): 5 mg via ORAL
  Filled 2016-04-01 (×4): qty 1

## 2016-04-01 MED ORDER — MAGNESIUM SULFATE 2 GM/50ML IV SOLN
2.0000 g | Freq: Once | INTRAVENOUS | Status: AC
  Administered 2016-04-01: 2 g via INTRAVENOUS
  Filled 2016-04-01: qty 50

## 2016-04-01 NOTE — Progress Notes (Signed)
Orders received.  
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
## 2016-04-01 NOTE — Progress Notes (Signed)
Rehab Admissions Coordinator Note:  Patient was screened by Retta Diones for appropriateness for an Inpatient Acute Rehab Consult.  At this time, we are recommending Inpatient Rehab consult.  Retta Diones 04/01/2016, 9:15 AM  I can be reached at [phone].

## 2016-04-01 NOTE — Progress Notes (Signed)
Pt began to desat into the low 80's. Pt complained of having a cough and unable to clear mucous. Pt placed on non-rebreather at 15L stats began to improve. Nurse had to perform NTS to loosen mucous for pt. Pt tolerated well and sats back up to 96% on 6L of O2. Paged L. [place]. Awaiting orders.
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
## 2016-04-02 ENCOUNTER — Inpatient Hospital Stay (HOSPITAL_COMMUNITY): Payer: Medicare Other

## 2016-04-02 DIAGNOSIS — S06890D Other specified intracranial injury without loss of consciousness, subsequent encounter: Secondary | ICD-10-CM

## 2016-04-02 LAB — COMPREHENSIVE METABOLIC PANEL
ALBUMIN: 2.4 g/dL — AB (ref 3.5–5.0)
ALK PHOS: 40 U/L (ref 38–126)
ALT: 14 U/L — AB (ref 17–63)
ANION GAP: 7 (ref 5–15)
AST: 38 U/L (ref 15–41)
BILIRUBIN TOTAL: 1.2 mg/dL (ref 0.3–1.2)
BUN: 20 mg/dL (ref 6–20)
CALCIUM: 8 mg/dL — AB (ref 8.9–10.3)
CO2: 23 mmol/L (ref 22–32)
CREATININE: 0.89 mg/dL (ref 0.61–1.24)
Chloride: 98 mmol/L — ABNORMAL LOW (ref 101–111)
GFR calc Af Amer: >60 mL/min (ref >60)
GFR calc non Af Amer: >60 mL/min (ref >60)
GLUCOSE: 102 mg/dL — AB (ref 65–99)
Potassium: 3.7 mmol/L (ref 3.5–5.1)
SODIUM: 128 mmol/L — AB (ref 135–145)
TOTAL PROTEIN: 5.2 g/dL — AB (ref 6.5–8.1)

## 2016-04-02 LAB — GLUCOSE, CAPILLARY: GLUCOSE-CAPILLARY: 102 mg/dL — AB (ref 65–99)

## 2016-04-02 LAB — CBC
HEMATOCRIT: 28.6 % — AB (ref 39.0–52.0)
HEMOGLOBIN: 9.8 g/dL — AB (ref 13.0–17.0)
MCH: 33.6 pg (ref 26.0–34.0)
MCHC: 34.3 g/dL (ref 30.0–36.0)
MCV: 97.9 fL (ref 78.0–100.0)
Platelets: 57 10*3/uL — ABNORMAL LOW (ref 150–400)
RBC: 2.92 MIL/uL — AB (ref 4.22–5.81)
RDW: 14.3 % (ref 11.5–15.5)
WBC: 7.4 10*3/uL (ref 4.0–10.5)

## 2016-04-02 LAB — MAGNESIUM: Magnesium: 2 mg/dL (ref 1.7–2.4)

## 2016-04-02 MED ORDER — ATORVASTATIN CALCIUM 20 MG PO TABS
20.0000 mg | ORAL_TABLET | Freq: Every day | ORAL | Status: DC
  Administered 2016-04-05 – 2016-04-08 (×4): 20 mg via ORAL
  Filled 2016-04-02 (×4): qty 1

## 2016-04-02 MED ORDER — CEFAZOLIN SODIUM-DEXTROSE 2-4 GM/100ML-% IV SOLN
2.0000 g | INTRAVENOUS | Status: DC
  Filled 2016-04-02: qty 100

## 2016-04-02 MED ORDER — SODIUM CHLORIDE 0.9% FLUSH
3.0000 mL | Freq: Two times a day (BID) | INTRAVENOUS | Status: DC
  Administered 2016-04-02 – 2016-04-03 (×2): 3 mL via INTRAVENOUS

## 2016-04-02 MED ORDER — FUROSEMIDE 10 MG/ML IJ SOLN
INTRAMUSCULAR | Status: AC
  Administered 2016-04-02: 60 mg
  Filled 2016-04-02: qty 6

## 2016-04-02 MED ORDER — IPRATROPIUM-ALBUTEROL 0.5-2.5 (3) MG/3ML IN SOLN
3.0000 mL | Freq: Four times a day (QID) | RESPIRATORY_TRACT | Status: DC
  Administered 2016-04-02 – 2016-04-03 (×3): 3 mL via RESPIRATORY_TRACT
  Filled 2016-04-02 (×3): qty 3

## 2016-04-02 MED ORDER — FUROSEMIDE 10 MG/ML IJ SOLN
60.0000 mg | Freq: Once | INTRAMUSCULAR | Status: AC
  Administered 2016-04-02: 60 mg via INTRAVENOUS

## 2016-04-02 MED ORDER — METHYLPREDNISOLONE SODIUM SUCC 125 MG IJ SOLR
60.0000 mg | Freq: Two times a day (BID) | INTRAMUSCULAR | Status: DC
  Administered 2016-04-02 – 2016-04-03 (×2): 60 mg via INTRAVENOUS
  Filled 2016-04-02 (×2): qty 2

## 2016-04-02 MED ORDER — SODIUM CHLORIDE 0.9 % WEIGHT BASED INFUSION
3.0000 mL/kg/h | INTRAVENOUS | Status: DC
  Administered 2016-04-03: 3 mL/kg/h via INTRAVENOUS

## 2016-04-02 MED ORDER — ASPIRIN 81 MG PO CHEW
81.0000 mg | CHEWABLE_TABLET | ORAL | Status: AC
  Administered 2016-04-03: 81 mg via ORAL
  Filled 2016-04-02: qty 1

## 2016-04-02 MED ORDER — CEFAZOLIN SODIUM-DEXTROSE 2-4 GM/100ML-% IV SOLN
2.0000 g | INTRAVENOUS | Status: DC
Start: 2016-04-03 — End: 2016-04-03

## 2016-04-02 MED ORDER — ENSURE ENLIVE PO LIQD
237.0000 mL | Freq: Three times a day (TID) | ORAL | Status: DC
  Administered 2016-04-02 – 2016-04-08 (×7): 237 mL via ORAL

## 2016-04-02 MED ORDER — SODIUM CHLORIDE 0.9 % WEIGHT BASED INFUSION
1.0000 mL/kg/h | INTRAVENOUS | Status: DC
  Administered 2016-04-03: 1 mL/kg/h via INTRAVENOUS

## 2016-04-02 MED ORDER — SODIUM CHLORIDE 0.9 % IV SOLN: 250.0000 mL | INTRAVENOUS | Status: DC | PRN

## 2016-04-02 MED ORDER — SODIUM CHLORIDE 0.9% FLUSH: 3.0000 mL | INTRAVENOUS | Status: DC | PRN
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
## 2016-04-02 NOTE — Progress Notes (Signed)
RT called to bedside to assess patient who had complaints of "not being able to breathe" and "not getting enough air". Patient was given PRN breathing treatment and patient stated a slight relief in SOB. Patient subsequently desaturated to mid 70's. Patient tried on venti mask and ultimately placed on NRB (flowmeter maxed). MD at bedside with patient and CXR was obtained. Patient is abdominal breathing and abdomen appears more distended than at AM treatment. MD and RN aware. RT will continue to monitor patient.

## 2016-04-02 NOTE — Progress Notes (Signed)
Patient is stable.  Discussed surgery for left hand with patient this am.  He is in agreement.  Plan for surgery Wednesday of left hand.  Azucena Cecil, MD [place] 7:37 AM

## 2016-04-02 NOTE — Progress Notes (Signed)
PT Cancellation Note  Patient Details Name: [name] MRN: [id number] DOB: [birth year]   Cancelled Treatment:    Reason Eval/Treat Not Completed: Patient not medically ready (PA to room to pull chest tube, will hold OOB session today)   [name] 04/02/2016, 12:11 PM Alben Deeds, [place] DPT  [phone]

## 2016-04-02 NOTE — Consult Note (Signed)
Rehab admissions - I am following for potential acute inpatient rehab admission once all tests and surgical procedures are completed.  Call me for questions.  RC:[phone]
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
## 2016-04-02 NOTE — Progress Notes (Signed)
Removed R chest tube at [id number] AM without complication. There were 2 sutures in the skin - I removed one, and left one in place. Pt will need to have the other suture removed either in the hospital or in follow-up.   Follow-up CXR ordered.   Obie Dredge, PA-C [facility] Pager: [phone] Mon-Fri 7:00 am-4:30 pm Sat-Sun 7:00 am-11:30 am
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
## 2016-04-02 NOTE — Care Management Important Message (Signed)
Important Message  Patient Details  Name: [name] MRN: [id number] Date of Birth: 1931-09-01   Medicare Important Message Given:  Yes    Nathen May 04/02/2016, 10:51 AM

## 2016-04-02 NOTE — Progress Notes (Signed)
SUBJECTIVE:  The patient new I was his heart doctor but could not recall my name.  He always seems to have some baseline decreased memory.  He has rib pain.  He apparently had SOB and could not cough up mucous.  Sats decreased last night.  Required NTS.    PHYSICAL EXAM Filed Vitals:   04/02/16 0213 04/02/16 0232 04/02/16 0710 04/02/16 0755  BP: 105/60  131/64   Pulse: 69  71   Temp: 97.9 F (36.6 C)   98 F (36.7 C)  TempSrc: Oral   Oral  Resp: 18  23   Height:      Weight:  195 lb 1.7 oz (88.5 kg)    SpO2: 100%  98%    General:  No acute distress Lungs:  Decreased breath sounds with diffuse wheezing Heart:  RRR Abdomen:  Positive bowel sounds, no rebound no guarding Extremities:  No edema Neuro:  Nonfocal  LABS: Lab Results  Component Value Date   TROPONINI 4.87* 03/29/2016   Results for orders placed or performed during the hospital encounter of 03/29/16 (from the past 24 hour(s))  Glucose, capillary     Status: Abnormal   Collection Time: 04/01/16 11:46 AM  Result Value Ref Range   Glucose-Capillary 121 (H) 65 - 99 mg/dL   Comment 1 Notify RN    Comment 2 Document in Chart   TSH     Status: Abnormal   Collection Time: 04/01/16  2:00 PM  Result Value Ref Range   TSH 5.091 (H) 0.350 - 4.500 uIU/mL  CBC     Status: Abnormal   Collection Time: 04/02/16  5:04 AM  Result Value Ref Range   WBC 7.4 4.0 - 10.5 K/uL   RBC 2.92 (L) 4.22 - 5.81 MIL/uL   Hemoglobin 9.8 (L) 13.0 - 17.0 g/dL   HCT 28.6 (L) 39.0 - 52.0 %   MCV 97.9 78.0 - 100.0 fL   MCH 33.6 26.0 - 34.0 pg   MCHC 34.3 30.0 - 36.0 g/dL   RDW 14.3 11.5 - 15.5 %   Platelets 57 (L) 150 - 400 K/uL  Comprehensive metabolic panel     Status: Abnormal   Collection Time: 04/02/16  5:04 AM  Result Value Ref Range   Sodium 128 (L) 135 - 145 mmol/L   Potassium 3.7 3.5 - 5.1 mmol/L   Chloride 98 (L) 101 - 111 mmol/L   CO2 23 22 - 32 mmol/L   Glucose, Bld 102 (H) 65 - 99 mg/dL   BUN 20 6 - 20 mg/dL   Creatinine, Ser 0.89 0.61 - 1.24 mg/dL   Calcium 8.0 (L) 8.9 - 10.3 mg/dL   Total Protein 5.2 (L) 6.5 - 8.1 g/dL   Albumin 2.4 (L) 3.5 - 5.0 g/dL   AST 38 15 - 41 U/L   ALT 14 (L) 17 - 63 U/L   Alkaline Phosphatase 40 38 - 126 U/L   Total Bilirubin 1.2 0.3 - 1.2 mg/dL   GFR calc non Af Amer >60 >60 mL/min   GFR calc Af Amer >60 >60 mL/min   Anion gap 7 5 - 15  Magnesium     Status: None   Collection Time: 04/02/16  5:04 AM  Result Value Ref Range   Magnesium 2.0 1.7 - 2.4 mg/dL    Intake/Output Summary (Last 24 hours) at 04/02/16 0955 Last data filed at 04/02/16 0800  Gross per 24 hour  Intake  463.4 ml  Output  1135 ml  Net -671.6 ml    ASSESSMENT AND PLAN:  CAD:  Given the event with acute arrhythmia, reduced EF and elevated troponin cath is indicated.  Discussed with the patient and he agrees.  (I left a message with his daughter.)  The patient understands that risks included but are not limited to stroke (1 in 33), death (1 in 23), kidney failure [usually temporary] (1 in 500), bleeding (1 in 200), allergic reaction [possibly serious] (1 in 200).  The patient understands and agrees to proceed.   Plan for tomorrow.  This would most likely be a diagnostic only cath since long term anticoagulation would be high risk.    SUBDURAL HEMATOMA:   Stable.  NS has signed off.  I will leave a message for Dr. Vertell Limber to see if short term anticoagulation would be a problem should we find a very high risk lesion that requires treatment.    PTX:  Resolved.   Chest tube out yesterday.    CARDIAC ARREST:   On amiodarone for now.    EP to see for replacement of ICD.  I contacted them.   THROMBOCYTOPENIA:  Low but stable.   VENTRICULAR TACHYCARDIA:    As above.   TRAUMA:  Will have ORIF Wed of left wrist provided everything else is OK.Marland Kitchen   CARDIOMYOPATHY:    Seems to be euvolemic.  For now continue the current meds.   Minus Breeding 04/02/2016 9:55 AM

## 2016-04-03 ENCOUNTER — Encounter (HOSPITAL_COMMUNITY)
Admission: EM | Disposition: A | Discharge status: Rehabilitation Facility | Payer: Self-pay | Source: Home / Self Care | Attending: Internal Medicine

## 2016-04-03 ENCOUNTER — Inpatient Hospital Stay (HOSPITAL_COMMUNITY): Payer: Medicare Other

## 2016-04-03 DIAGNOSIS — I255 Ischemic cardiomyopathy: Secondary | ICD-10-CM

## 2016-04-03 DIAGNOSIS — S065X9A Traumatic subdural hemorrhage with loss of consciousness of unspecified duration, initial encounter: Secondary | ICD-10-CM | POA: Diagnosis present

## 2016-04-03 DIAGNOSIS — D696 Thrombocytopenia, unspecified: Secondary | ICD-10-CM

## 2016-04-03 DIAGNOSIS — S065XAA Traumatic subdural hemorrhage with loss of consciousness status unknown, initial encounter: Secondary | ICD-10-CM | POA: Diagnosis present

## 2016-04-03 DIAGNOSIS — I2510 Atherosclerotic heart disease of native coronary artery without angina pectoris: Secondary | ICD-10-CM

## 2016-04-03 HISTORY — PX: CARDIAC CATHETERIZATION: SHX172

## 2016-04-03 LAB — COMPREHENSIVE METABOLIC PANEL
ALK PHOS: 46 U/L (ref 38–126)
ALT: 16 U/L — ABNORMAL LOW (ref 17–63)
ANION GAP: 10 (ref 5–15)
AST: 37 U/L (ref 15–41)
Albumin: 2.5 g/dL — ABNORMAL LOW (ref 3.5–5.0)
BILIRUBIN TOTAL: 1 mg/dL (ref 0.3–1.2)
BUN: 21 mg/dL — ABNORMAL HIGH (ref 6–20)
CALCIUM: 8.8 mg/dL — AB (ref 8.9–10.3)
CO2: 25 mmol/L (ref 22–32)
Chloride: 95 mmol/L — ABNORMAL LOW (ref 101–111)
Creatinine, Ser: 0.81 mg/dL (ref 0.61–1.24)
Glucose, Bld: 162 mg/dL — ABNORMAL HIGH (ref 65–99)
Potassium: 4.1 mmol/L (ref 3.5–5.1)
SODIUM: 130 mmol/L — AB (ref 135–145)
TOTAL PROTEIN: 5.5 g/dL — AB (ref 6.5–8.1)

## 2016-04-03 LAB — CULTURE, BLOOD (ROUTINE X 2)
CULTURE: NO GROWTH
CULTURE: NO GROWTH

## 2016-04-03 LAB — CBC
HCT: 30.3 % — ABNORMAL LOW (ref 39.0–52.0)
HEMOGLOBIN: 10.4 g/dL — AB (ref 13.0–17.0)
MCH: 32.7 pg (ref 26.0–34.0)
MCHC: 34.3 g/dL (ref 30.0–36.0)
MCV: 95.3 fL (ref 78.0–100.0)
Platelets: 84 10*3/uL — ABNORMAL LOW (ref 150–400)
RBC: 3.18 MIL/uL — ABNORMAL LOW (ref 4.22–5.81)
RDW: 13.6 % (ref 11.5–15.5)
WBC: 5.7 10*3/uL (ref 4.0–10.5)

## 2016-04-03 LAB — HEMOGLOBIN A1C
Hgb A1c MFr Bld: 5.1 % (ref 4.8–5.6)
Mean Plasma Glucose: 100 mg/dL

## 2016-04-03 SURGERY — LEFT HEART CATH AND CORONARY ANGIOGRAPHY

## 2016-04-03 MED ORDER — IOPAMIDOL (ISOVUE-370) INJECTION 76%
INTRAVENOUS | Status: AC
  Filled 2016-04-03: qty 50

## 2016-04-03 MED ORDER — IOPAMIDOL (ISOVUE-370) INJECTION 76%
INTRAVENOUS | Status: DC | PRN
  Administered 2016-04-03: 130 mL via INTRA_ARTERIAL

## 2016-04-03 MED ORDER — ACETAMINOPHEN 325 MG PO TABS: 650.0000 mg | ORAL_TABLET | ORAL | Status: DC | PRN

## 2016-04-03 MED ORDER — MIDAZOLAM HCL 2 MG/2ML IJ SOLN
INTRAMUSCULAR | Status: DC | PRN
  Administered 2016-04-03: 1 mg via INTRAVENOUS

## 2016-04-03 MED ORDER — MIDAZOLAM HCL 2 MG/2ML IJ SOLN
INTRAMUSCULAR | Status: AC
  Filled 2016-04-03: qty 2

## 2016-04-03 MED ORDER — FENTANYL CITRATE (PF) 100 MCG/2ML IJ SOLN
INTRAMUSCULAR | Status: DC | PRN
  Administered 2016-04-03: 50 ug via INTRAVENOUS

## 2016-04-03 MED ORDER — SODIUM CHLORIDE 0.9% FLUSH
3.0000 mL | Freq: Two times a day (BID) | INTRAVENOUS | Status: DC
  Administered 2016-04-03 – 2016-04-08 (×9): 3 mL via INTRAVENOUS

## 2016-04-03 MED ORDER — IPRATROPIUM-ALBUTEROL 0.5-2.5 (3) MG/3ML IN SOLN
3.0000 mL | Freq: Two times a day (BID) | RESPIRATORY_TRACT | Status: DC
  Administered 2016-04-03 – 2016-04-05 (×3): 3 mL via RESPIRATORY_TRACT
  Filled 2016-04-03 (×3): qty 3

## 2016-04-03 MED ORDER — SODIUM CHLORIDE 0.9 % IV SOLN: 250.0000 mL | INTRAVENOUS | Status: DC | PRN

## 2016-04-03 MED ORDER — SODIUM CHLORIDE 0.9% FLUSH: 3.0000 mL | INTRAVENOUS | Status: DC | PRN

## 2016-04-03 MED ORDER — IOPAMIDOL (ISOVUE-370) INJECTION 76%
INTRAVENOUS | Status: AC
  Filled 2016-04-03: qty 100

## 2016-04-03 MED ORDER — ONDANSETRON HCL 4 MG/2ML IJ SOLN: 4.0000 mg | Freq: Four times a day (QID) | INTRAMUSCULAR | Status: DC | PRN

## 2016-04-03 MED ORDER — HEPARIN (PORCINE) IN NACL 2-0.9 UNIT/ML-% IJ SOLN
INTRAMUSCULAR | Status: DC | PRN
  Administered 2016-04-03: 17:00:00

## 2016-04-03 MED ORDER — HEPARIN (PORCINE) IN NACL 2-0.9 UNIT/ML-% IJ SOLN
INTRAMUSCULAR | Status: AC
  Filled 2016-04-03: qty 1500

## 2016-04-03 MED ORDER — LIDOCAINE HCL (PF) 1 % IJ SOLN
INTRAMUSCULAR | Status: AC
  Filled 2016-04-03: qty 30

## 2016-04-03 MED ORDER — FENTANYL CITRATE (PF) 100 MCG/2ML IJ SOLN
INTRAMUSCULAR | Status: AC
  Filled 2016-04-03: qty 2

## 2016-04-03 MED ORDER — LIDOCAINE HCL (PF) 1 % IJ SOLN
INTRAMUSCULAR | Status: DC | PRN
  Administered 2016-04-03: 15 mL

## 2016-04-03 MED ORDER — SODIUM CHLORIDE 0.9% FLUSH
3.0000 mL | Freq: Two times a day (BID) | INTRAVENOUS | Status: DC
  Administered 2016-04-03 – 2016-04-08 (×8): 3 mL via INTRAVENOUS

## 2016-04-03 SURGICAL SUPPLY — 8 items
CATH INFINITI 5FR JL4 (CATHETERS) ×2 IMPLANT
CATH INFINITI JR4 5F (CATHETERS) ×2 IMPLANT
CATH SITESEER 5F MULTI A 2 (CATHETERS) ×2 IMPLANT
KIT HEART LEFT (KITS) ×3 IMPLANT
PACK CARDIAC CATHETERIZATION (CUSTOM PROCEDURE TRAY) ×3 IMPLANT
SHEATH PINNACLE 5F 10CM (SHEATH) ×2 IMPLANT
TRANSDUCER W/STOPCOCK (MISCELLANEOUS) ×3 IMPLANT
WIRE EMERALD 3MM-J .035X150CM (WIRE) ×2 IMPLANT
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
## 2016-04-03 NOTE — H&P (View-Only) (Signed)
SUBJECTIVE:  SOB last night.  However, he reports that this was associated with significant urinary retention and that once this improved he was breathing better.  He has no SOB this morning.     PHYSICAL EXAM Filed Vitals:   04/03/16 0233 04/03/16 0236 04/03/16 0319 04/03/16 0831  BP:  115/63    Pulse:  73    Temp: 97.9 F (36.6 C)     TempSrc: Oral     Resp:  15    Height:      Weight:   192 lb 14.4 oz (87.5 kg)   SpO2:  98%  98%   General:  No acute distress Lungs:  Decreased breath sounds with diffuse wheezing Heart:  RRR Abdomen:  Positive bowel sounds, no rebound no guarding Extremities:  No edema Neuro:  Nonfocal  LABS:  Results for orders placed or performed during the hospital encounter of 03/29/16 (from the past 24 hour(s))  CBC     Status: Abnormal   Collection Time: 04/03/16  2:41 AM  Result Value Ref Range   WBC 5.7 4.0 - 10.5 K/uL   RBC 3.18 (L) 4.22 - 5.81 MIL/uL   Hemoglobin 10.4 (L) 13.0 - 17.0 g/dL   HCT 30.3 (L) 39.0 - 52.0 %   MCV 95.3 78.0 - 100.0 fL   MCH 32.7 26.0 - 34.0 pg   MCHC 34.3 30.0 - 36.0 g/dL   RDW 13.6 11.5 - 15.5 %   Platelets 84 (L) 150 - 400 K/uL  Comprehensive metabolic panel     Status: Abnormal   Collection Time: 04/03/16  2:41 AM  Result Value Ref Range   Sodium 130 (L) 135 - 145 mmol/L   Potassium 4.1 3.5 - 5.1 mmol/L   Chloride 95 (L) 101 - 111 mmol/L   CO2 25 22 - 32 mmol/L   Glucose, Bld 162 (H) 65 - 99 mg/dL   BUN 21 (H) 6 - 20 mg/dL   Creatinine, Ser 0.81 0.61 - 1.24 mg/dL   Calcium 8.8 (L) 8.9 - 10.3 mg/dL   Total Protein 5.5 (L) 6.5 - 8.1 g/dL   Albumin 2.5 (L) 3.5 - 5.0 g/dL   AST 37 15 - 41 U/L   ALT 16 (L) 17 - 63 U/L   Alkaline Phosphatase 46 38 - 126 U/L   Total Bilirubin 1.0 0.3 - 1.2 mg/dL   GFR calc non Af Amer >60 >60 mL/min   GFR calc Af Amer >60 >60 mL/min   Anion gap 10 5 - 15    Intake/Output Summary (Last 24 hours) at 04/03/16 [id number] Last data filed at 04/03/16 0541  Gross per 24 hour    Intake   1433 ml  Output   3575 ml  Net  -2142 ml    ASSESSMENT AND PLAN:  CAD:  Given the event with acute arrhythmia, reduced EF and elevated troponin cath is indicated.  Planned for today.   (I left a message with his daughter.)  The patient understands that risks included but are not limited to stroke (1 in 68), death (1 in 30), kidney failure [usually temporary] (1 in 500), bleeding (1 in 200), allergic reaction [possibly serious] (1 in 200).  The patient understands and agrees to proceed.        SUBDURAL HEMATOMA:   Stable.  NS has signed off.  OK to use ASA/Plavix if necessary.    PTX:  Resolved.   Chest tube out.    CARDIAC ARREST:  On amiodarone for now.    EP saw in consultation.  They will follow for timing of ICD replacement.    THROMBOCYTOPENIA:  Low but increasing.   VENTRICULAR TACHYCARDIA:    As above.   TRAUMA:  Will have ORIF Wed of left wrist provided everything else is OK.Marland Kitchen   CARDIOMYOPATHY:    Seems to be euvolemic.  For now continue the current meds.   HYPONATREMIA:  Improved.  Follow.    Minus Breeding 04/03/2016 8:32 AM

## 2016-04-03 NOTE — NC FL2 (Signed)
[place] LEVEL OF CARE SCREENING TOOL     IDENTIFICATION  Patient Name: [name] Birthdate: [birth year] Sex: male Admission Date (Current Location): 03/29/2016  [facility] and [place] Number:  Herbalist and Address:  [facility], [place] [address] [place], [place] [address]      Provider Number: [id number]  Attending Physician Name and Address:  Cherene Altes, MD  Relative Name and Phone Number:       Current Level of Care: Hospital Recommended Level of Care: [place] Prior Approval Number:    Date Approved/Denied:   PASRR Number: [id number]  Discharge Plan: SNF    Current Diagnoses: Patient Active Problem List   Diagnosis Date Noted  . Cardiomyopathy, ischemic   . DCM (dilated cardiomyopathy) ([place]) 04/01/2016  . Ventricular tachycardia ([place]) 04/01/2016  . Cardiac arrest ([place]) 03/29/2016  . Pressure ulcer 03/29/2016  . Difficult intravenous access     Orientation RESPIRATION BLADDER Height & Weight     Self, Time, Situation, Place  O2 (Nasal Canula 3 L) Continent, Indwelling catheter Weight: 192 lb 14.4 oz (87.5 kg) Height:  6' (182.9 cm)  BEHAVIORAL SYMPTOMS/MOOD NEUROLOGICAL BOWEL NUTRITION STATUS   (None)  (None) Continent  (Currently NPO for surgery tomorrow. Unable to find previous diet order.)  AMBULATORY STATUS COMMUNICATION OF NEEDS Skin   Limited Assist Verbally Skin abrasions, PU Stage and Appropriate Care, Surgical wounds (Skin tear (elbow, knee, leg), ecchymosis (abdomen, arm, leg), and abrasion (sternum and legs). Open or dehisced wound/incision left hand.) PU Stage 1 Dressing:  (Foam prn)                     Personal Care Assistance Level of Assistance  Bathing, Feeding, Dressing Bathing Assistance: Limited assistance Feeding assistance: Independent Dressing Assistance: Maximum assistance     Functional Limitations Info  Sight, Hearing, Speech Sight Info: Adequate Hearing  Info: Adequate Speech Info: Adequate    SPECIAL CARE FACTORS FREQUENCY  PT (By licensed PT), OT (By licensed OT)     PT Frequency: 5 x week OT Frequency: 5 x week            Contractures Contractures Info: Not present    Additional Factors Info  Code Status, Allergies Code Status Info: Full Allergies Info: NKDA           Current Medications (04/03/2016):  This is the current hospital active medication list Current Facility-Administered Medications  Medication Dose Route Frequency Provider Last Rate Last Dose  . 0.9 %  sodium chloride infusion  250 mL Intravenous PRN Minus Breeding, MD      . 0.9% sodium chloride infusion  1 mL/kg/hr Intravenous Continuous Minus Breeding, MD 88.5 mL/hr at 04/03/16 0643 1 mL/kg/hr at 04/03/16 0643  . albuterol (PROVENTIL) (2.5 MG/3ML) 0.083% nebulizer solution 2.5 mg  2.5 mg Nebulization Q2H PRN Cherene Altes, MD   2.5 mg at 04/02/16 1549  . allopurinol (ZYLOPRIM) tablet 100 mg  100 mg Oral Daily Cherene Altes, MD   100 mg at 04/03/16 0943  . amiodarone (PACERONE) tablet 400 mg  400 mg Oral BID Sueanne Margarita, MD   400 mg at 04/03/16 0942  . atorvastatin (LIPITOR) tablet 20 mg  20 mg Oral q1800 Cherene Altes, MD      . bisacodyl (DULCOLAX) suppository 10 mg  10 mg Rectal Daily PRN Cherene Altes, MD      . carvedilol (COREG) tablet 3.125 mg  3.125 mg Oral BID WC Sueanne Margarita, MD   3.125 mg at 04/03/16 0943  . ceFAZolin (ANCEF) IVPB 1 g/50 mL premix  1 g Intravenous Q8H Otilio Miu, RPH   1 g at 04/03/16 1100  . ceFAZolin (ANCEF) IVPB 2g/100 mL premix  2 g Intravenous To OR Skeet Simmer, RPH      . feeding supplement (ENSURE ENLIVE) (ENSURE ENLIVE) liquid 237 mL  237 mL Oral TID BM Cherene Altes, MD   237 mL at 04/02/16 2110  . guaiFENesin (MUCINEX) 12 hr tablet 1,200 mg  1,200 mg Oral BID Hewitt Shorts Harduk, PA-C   1,200 mg at 04/03/16 [id number]  . HYDROcodone-acetaminophen (NORCO/VICODIN) 5-325 MG per tablet 1 tablet  1  tablet Oral Q6H PRN Anders Simmonds, MD   1 tablet at 04/02/16 2104  . ipratropium-albuterol (DUONEB) 0.5-2.5 (3) MG/3ML nebulizer solution 3 mL  3 mL Nebulization BID Cherene Altes, MD      . lisinopril (PRINIVIL,ZESTRIL) tablet 5 mg  5 mg Oral Daily Sueanne Margarita, MD   5 mg at 04/03/16 0943  . methylPREDNISolone sodium succinate (SOLU-MEDROL) 125 mg/2 mL injection 60 mg  60 mg Intravenous Q12H Cherene Altes, MD   60 mg at 04/03/16 0319  . morphine 2 MG/ML injection 2-4 mg  2-4 mg Intravenous Q3H PRN Collene Gobble, MD   4 mg at 04/01/16 0321  . ondansetron (ZOFRAN) injection 4 mg  4 mg Intravenous Q6H PRN Rush Farmer, MD      . polyethylene glycol (MIRALAX / GLYCOLAX) packet 17 g  17 g Oral Daily Cherene Altes, MD   17 g at 04/03/16 0943  . senna-docusate (Senokot-S) tablet 1 tablet  1 tablet Oral BID Cherene Altes, MD   1 tablet at 04/03/16 [phone]  . sodium chloride (OCEAN) 0.65 % nasal spray 1 spray  1 spray Each Nare PRN Mickel Baas A Harduk, PA-C      . sodium chloride flush (NS) 0.9 % injection 3 mL  3 mL Intravenous Q12H Minus Breeding, MD   3 mL at 04/03/16 0946  . sodium chloride flush (NS) 0.9 % injection 3 mL  3 mL Intravenous PRN Minus Breeding, MD      . thiamine (B-1) injection 100 mg  100 mg Intravenous Daily Anders Simmonds, MD   100 mg at 04/03/16 [id number]     Discharge Medications: Please see discharge summary for a list of discharge medications.  Relevant Imaging Results:  Relevant Lab Results:   Additional Information SS#: [id number]  Candie Chroman, LCSW

## 2016-04-03 NOTE — Progress Notes (Signed)
Site area: RFA Site Prior to Removal:  Level 0 Pressure Applied For:20 min Manual:   yes Patient Status During Pull:  stable Post Pull Site:  Level 0 Post Pull Instructions Given:  yes Post Pull Pulses Present: palpable Dressing Applied:  tegaderm Bedrest begins @1745  till 2145  Comments:report called to [employer]

## 2016-04-03 NOTE — Clinical Social Work Placement (Signed)
   CLINICAL SOCIAL WORK PLACEMENT  NOTE  Date:  04/03/2016  Patient Details  Name: [name] MRN: [id number] Date of Birth: 07-24-1931  Clinical Social Work is seeking post-discharge placement for this patient at the [place] level of care (*CSW will initial, date and re-position this form in  chart as items are completed):  Yes   Patient/family provided with [place] Work Department's list of facilities offering this level of care within the geographic area requested by the patient (or if unable, by the patient's family).  Yes   Patient/family informed of their freedom to choose among providers that offer the needed level of care, that participate in Medicare, Medicaid or managed care program needed by the patient, have an available bed and are willing to accept the patient.  Yes   Patient/family informed of [place]'s ownership interest in [facility] and [facility], as well as of the fact that they are under no obligation to receive care at these facilities.  PASRR submitted to EDS on 04/03/16     PASRR number received on 04/03/16     Existing PASRR number confirmed on       FL2 transmitted to all facilities in geographic area requested by pt/family on 04/03/16     FL2 transmitted to all facilities within larger geographic area on       Patient informed that his/her managed care company has contracts with or will negotiate with certain facilities, including the following:            Patient/family informed of bed offers received.  Patient chooses bed at       Physician recommends and patient chooses bed at      Patient to be transferred to   on  .  Patient to be transferred to facility by       Patient family notified on   of transfer.  Name of family member notified:        PHYSICIAN       Additional Comment:    _______________________________________________ Candie Chroman, LCSW 04/03/2016, 3:52 PM

## 2016-04-03 NOTE — Interval H&P Note (Signed)
History and Physical Interval Note:  04/03/2016 2:40 PM  [name]  has presented today for surgery, with the diagnosis of cm  The various methods of treatment have been discussed with the patient and family. After consideration of risks, benefits and other options for treatment, the patient has consented to  Procedure(s): Left Heart Cath and Coronary Angiography (N/A) as a surgical intervention .  The patient's history has been reviewed, patient examined, no change in status, stable for surgery.  I have reviewed the patient's chart and labs.  Questions were answered to the patient's satisfaction.     [name]

## 2016-04-03 NOTE — Clinical Social Work Note (Signed)
Clinical Social Work Assessment  Patient Details  Name: Nainoa Woldt MRN: [id number] Date of Birth: 07-23-1931  Date of referral:  04/03/16               Reason for consult:  Facility Placement, Discharge Planning                Permission sought to share information with:  Facility Sport and exercise psychologist, Family Supports Permission granted to share information::  Yes, Verbal Permission Granted  Name::     Cherlynn Kaiser  Agency::  SNF's  Relationship::  Daughters  Contact Information:  [name]: [phone]: [phone]  Housing/Transportation Living arrangements for the past 2 months:  Single Family Home Source of Information:  Patient, Medical Team Patient Interpreter Needed:  None Criminal Activity/Legal Involvement Pertinent to Current Situation/Hospitalization:  No - Comment as needed Significant Relationships:  Adult Children, Friend Lives with:  Self (Patient's wife died one day prior to hospitalization) Do you feel safe going back to the place where you live?  Yes Need for family participation in patient care:  Yes (Comment)  Care giving concerns:  PT recommending CIR. CIR following progress following tomorrow's surgery. CSW initiating SNF backup.   Social Worker assessment / plan:  CSW met with patient. No supports at bedside but a friend had just arrived and was waiting outside the room. CSW introduced role and explained that discharge planning would be discussed. Discussed CIR and SNF backup. Patient says that he wants to wait to decide on SNF until later but gave verbal consent to fax information now so there is a plan ahead of time. Patient's wife's funeral is Thursday. No further concerns. CSW encouraged patient to contact CSW as needed. CSW will continue to follow patient and facilitate discharge to SNF if needed.  Employment status:  Retired Forensic scientist:  Medicare PT Recommendations:  [place] / Referral to community  resources:  [place]  Patient/Family's Response to care:  Patient wants to decide on SNF later on when he can discuss with his daughters. Patient's family supportive and involved in patient's care. Patient polite and appreciated social work intervention.  Patient/Family's Understanding of and Emotional Response to Diagnosis, Current Treatment, and Prognosis:  Patient knowledgeable of medical interventions and aware of possibility for SNF once medically stable for discharge.  Emotional Assessment Appearance:  Appears stated age Attitude/Demeanor/Rapport:   (Pleasant) Affect (typically observed):  Accepting, Appropriate, Calm, Pleasant Orientation:  Oriented to Self, Oriented to Place, Oriented to  Time, Oriented to Situation Alcohol / Substance use:  Never Used Psych involvement (Current and /or in the community):  No (Comment)  Discharge Needs  Concerns to be addressed:  Care Coordination Readmission within the last 30 days:  No Current discharge risk:  Dependent with Mobility, Lives alone Barriers to Discharge:  No Barriers Identified   Candie Chroman, LCSW 04/03/2016, 3:29 PM

## 2016-04-03 NOTE — Progress Notes (Signed)
SUBJECTIVE:  SOB last night.  However, he reports that this was associated with significant urinary retention and that once this improved he was breathing better.  He has no SOB this morning.     PHYSICAL EXAM Filed Vitals:   04/03/16 0233 04/03/16 0236 04/03/16 0319 04/03/16 0831  BP:  115/63    Pulse:  73    Temp: 97.9 F (36.6 C)     TempSrc: Oral     Resp:  15    Height:      Weight:   192 lb 14.4 oz (87.5 kg)   SpO2:  98%  98%   General:  No acute distress Lungs:  Decreased breath sounds with diffuse wheezing Heart:  RRR Abdomen:  Positive bowel sounds, no rebound no guarding Extremities:  No edema Neuro:  Nonfocal  LABS:  Results for orders placed or performed during the hospital encounter of 03/29/16 (from the past 24 hour(s))  CBC     Status: Abnormal   Collection Time: 04/03/16  2:41 AM  Result Value Ref Range   WBC 5.7 4.0 - 10.5 K/uL   RBC 3.18 (L) 4.22 - 5.81 MIL/uL   Hemoglobin 10.4 (L) 13.0 - 17.0 g/dL   HCT 30.3 (L) 39.0 - 52.0 %   MCV 95.3 78.0 - 100.0 fL   MCH 32.7 26.0 - 34.0 pg   MCHC 34.3 30.0 - 36.0 g/dL   RDW 13.6 11.5 - 15.5 %   Platelets 84 (L) 150 - 400 K/uL  Comprehensive metabolic panel     Status: Abnormal   Collection Time: 04/03/16  2:41 AM  Result Value Ref Range   Sodium 130 (L) 135 - 145 mmol/L   Potassium 4.1 3.5 - 5.1 mmol/L   Chloride 95 (L) 101 - 111 mmol/L   CO2 25 22 - 32 mmol/L   Glucose, Bld 162 (H) 65 - 99 mg/dL   BUN 21 (H) 6 - 20 mg/dL   Creatinine, Ser 0.81 0.61 - 1.24 mg/dL   Calcium 8.8 (L) 8.9 - 10.3 mg/dL   Total Protein 5.5 (L) 6.5 - 8.1 g/dL   Albumin 2.5 (L) 3.5 - 5.0 g/dL   AST 37 15 - 41 U/L   ALT 16 (L) 17 - 63 U/L   Alkaline Phosphatase 46 38 - 126 U/L   Total Bilirubin 1.0 0.3 - 1.2 mg/dL   GFR calc non Af Amer >60 >60 mL/min   GFR calc Af Amer >60 >60 mL/min   Anion gap 10 5 - 15    Intake/Output Summary (Last 24 hours) at 04/03/16 [id number] Last data filed at 04/03/16 0541  Gross per 24 hour    Intake   1433 ml  Output   3575 ml  Net  -2142 ml    ASSESSMENT AND PLAN:  CAD:  Given the event with acute arrhythmia, reduced EF and elevated troponin cath is indicated.  Planned for today.   (I left a message with his daughter.)  The patient understands that risks included but are not limited to stroke (1 in 52), death (1 in 84), kidney failure [usually temporary] (1 in 500), bleeding (1 in 200), allergic reaction [possibly serious] (1 in 200).  The patient understands and agrees to proceed.        SUBDURAL HEMATOMA:   Stable.  NS has signed off.  OK to use ASA/Plavix if necessary.    PTX:  Resolved.   Chest tube out.    CARDIAC ARREST:  On amiodarone for now.    EP saw in consultation.  They will follow for timing of ICD replacement.    THROMBOCYTOPENIA:  Low but increasing.   VENTRICULAR TACHYCARDIA:    As above.   TRAUMA:  Will have ORIF Wed of left wrist provided everything else is OK.Marland Kitchen   CARDIOMYOPATHY:    Seems to be euvolemic.  For now continue the current meds.   HYPONATREMIA:  Improved.  Follow.    Minus Breeding 04/03/2016 8:32 AM
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
## 2016-04-03 NOTE — Progress Notes (Signed)
PT Cancellation Note  Patient Details Name: [name] MRN: [id number] DOB: 06-14-1931   Cancelled Treatment:    Reason Eval/Treat Not Completed: Medical issues which prohibited therapy .  Pt is in cath lab.  PT to check back tomorrow. Thanks,   Barbarann Ehlers. [place], [place], DPT [phone]   04/03/2016, 4:46 PM

## 2016-04-03 NOTE — Progress Notes (Signed)
OT Cancellation Note  Patient Details Name: Boots Valera MRN: [id number] DOB: [birth year]   Cancelled Treatment:    Reason Eval/Treat Not Completed: Other (comment). Pt for cardiac cath today, spoke with nurse and she feels it is better to hold therapy today and resume tomorrow.  Rolm Baptise [id number] 04/03/2016, 9:50 AM

## 2016-04-04 ENCOUNTER — Inpatient Hospital Stay (HOSPITAL_COMMUNITY): Payer: Medicare Other | Admitting: Anesthesiology

## 2016-04-04 ENCOUNTER — Encounter (HOSPITAL_COMMUNITY): Payer: Self-pay | Admitting: Interventional Cardiology

## 2016-04-04 ENCOUNTER — Encounter (HOSPITAL_COMMUNITY)
Admission: EM | Disposition: A | Discharge status: Rehabilitation Facility | Payer: Self-pay | Source: Home / Self Care | Attending: Internal Medicine

## 2016-04-04 DIAGNOSIS — S065X9A Traumatic subdural hemorrhage with loss of consciousness of unspecified duration, initial encounter: Secondary | ICD-10-CM | POA: Diagnosis present

## 2016-04-04 DIAGNOSIS — I272 Pulmonary hypertension, unspecified: Secondary | ICD-10-CM | POA: Diagnosis present

## 2016-04-04 HISTORY — PX: OPEN REDUCTION INTERNAL FIXATION (ORIF) METACARPAL: SHX6234

## 2016-04-04 SURGERY — OPEN REDUCTION INTERNAL FIXATION (ORIF) METACARPAL
Anesthesia: General | Site: Arm Lower | Laterality: Left

## 2016-04-04 MED ORDER — ONDANSETRON HCL 4 MG/2ML IJ SOLN
INTRAMUSCULAR | Status: DC | PRN
Start: 2016-04-04 — End: 2016-04-04
  Administered 2016-04-04: 4 mg via INTRAVENOUS

## 2016-04-04 MED ORDER — FENTANYL CITRATE (PF) 250 MCG/5ML IJ SOLN
INTRAMUSCULAR | Status: AC
  Filled 2016-04-04: qty 5

## 2016-04-04 MED ORDER — SUCCINYLCHOLINE CHLORIDE 20 MG/ML IJ SOLN
INTRAMUSCULAR | Status: DC | PRN
  Administered 2016-04-04: 80 mg via INTRAVENOUS

## 2016-04-04 MED ORDER — PHENYLEPHRINE HCL 10 MG/ML IJ SOLN
10.0000 mg | INTRAVENOUS | Status: DC | PRN
  Administered 2016-04-04: 50 ug/min via INTRAVENOUS

## 2016-04-04 MED ORDER — ROCURONIUM BROMIDE 50 MG/5ML IV SOLN
INTRAVENOUS | Status: AC
  Filled 2016-04-04: qty 1

## 2016-04-04 MED ORDER — 0.9 % SODIUM CHLORIDE (POUR BTL) OPTIME
TOPICAL | Status: DC | PRN
  Administered 2016-04-04: 1000 mL

## 2016-04-04 MED ORDER — FENTANYL CITRATE (PF) 100 MCG/2ML IJ SOLN
INTRAMUSCULAR | Status: DC | PRN
Start: 2016-04-04 — End: 2016-04-04
  Administered 2016-04-04 (×2): 50 ug via INTRAVENOUS
  Administered 2016-04-04: 100 ug via INTRAVENOUS

## 2016-04-04 MED ORDER — LISINOPRIL 10 MG PO TABS
10.0000 mg | ORAL_TABLET | Freq: Every day | ORAL | Status: DC
  Administered 2016-04-06 – 2016-04-09 (×4): 10 mg via ORAL
  Filled 2016-04-04 (×5): qty 1

## 2016-04-04 MED ORDER — ONDANSETRON HCL 4 MG/2ML IJ SOLN
INTRAMUSCULAR | Status: AC
  Filled 2016-04-04: qty 2

## 2016-04-04 MED ORDER — LIDOCAINE 2% (20 MG/ML) 5 ML SYRINGE
INTRAMUSCULAR | Status: AC
  Filled 2016-04-04: qty 5

## 2016-04-04 MED ORDER — BUPIVACAINE HCL (PF) 0.25 % IJ SOLN
INTRAMUSCULAR | Status: AC
  Filled 2016-04-04: qty 30

## 2016-04-04 MED ORDER — PROPOFOL 10 MG/ML IV BOLUS
INTRAVENOUS | Status: DC | PRN
  Administered 2016-04-04: 20 mg via INTRAVENOUS
  Administered 2016-04-04: 30 mg via INTRAVENOUS
  Administered 2016-04-04: 100 mg via INTRAVENOUS

## 2016-04-04 MED ORDER — PROPOFOL 10 MG/ML IV BOLUS
INTRAVENOUS | Status: AC
  Filled 2016-04-04: qty 20

## 2016-04-04 MED ORDER — CARVEDILOL 6.25 MG PO TABS
6.2500 mg | ORAL_TABLET | Freq: Two times a day (BID) | ORAL | Status: DC
  Administered 2016-04-05 – 2016-04-09 (×8): 6.25 mg via ORAL
  Filled 2016-04-04 (×9): qty 1

## 2016-04-04 MED ORDER — BACITRACIN ZINC 500 UNIT/GM EX OINT
TOPICAL_OINTMENT | CUTANEOUS | Status: AC
  Filled 2016-04-04: qty 28.35

## 2016-04-04 MED ORDER — LACTATED RINGERS IV SOLN
INTRAVENOUS | Status: DC
  Administered 2016-04-04 (×2): via INTRAVENOUS

## 2016-04-04 MED ORDER — SULFAMETHOXAZOLE-TRIMETHOPRIM 800-160 MG PO TABS
1.0000 | ORAL_TABLET | Freq: Two times a day (BID) | ORAL | Status: DC
  Administered 2016-04-04 – 2016-04-09 (×10): 1 via ORAL
  Filled 2016-04-04 (×12): qty 1

## 2016-04-04 MED ORDER — LIDOCAINE HCL (CARDIAC) 20 MG/ML IV SOLN
INTRAVENOUS | Status: DC | PRN
  Administered 2016-04-04: 100 mg via INTRAVENOUS

## 2016-04-04 MED ORDER — PHENYLEPHRINE HCL 10 MG/ML IJ SOLN
INTRAMUSCULAR | Status: DC | PRN
  Administered 2016-04-04: 240 ug via INTRAVENOUS
  Administered 2016-04-04: 120 ug via INTRAVENOUS
  Administered 2016-04-04: 80 ug via INTRAVENOUS

## 2016-04-04 MED ORDER — SODIUM CHLORIDE 0.9 % IR SOLN
Status: DC | PRN
  Administered 2016-04-04: 6000 mL

## 2016-04-04 SURGICAL SUPPLY — 63 items
.62 k wire ×4 IMPLANT
BANDAGE ACE 4X5 VEL STRL LF (GAUZE/BANDAGES/DRESSINGS) ×2 IMPLANT
BANDAGE ACE 6X5 VEL STRL LF (GAUZE/BANDAGES/DRESSINGS) ×2 IMPLANT
BIT DRILL 1.1X3.5 QK RELEA (DRILL) IMPLANT
BIT DRILL 1.1X3.5 QUICK RELEAS (DRILL) ×3
BIT DRILL 2X3.5 HAND QK RELEAS (BIT) IMPLANT
BIT DRILL 2X3.5 QUICK RELEASE (BIT) ×3
BLADE SURG 15 STRL LF DISP TIS (BLADE) ×1 IMPLANT
BLADE SURG 15 STRL SS (BLADE) ×9
BNDG CMPR 9X4 STRL LF SNTH (GAUZE/BANDAGES/DRESSINGS) ×1
BNDG CONFORM 2 STRL LF (GAUZE/BANDAGES/DRESSINGS) ×4 IMPLANT
BNDG ESMARK 4X9 LF (GAUZE/BANDAGES/DRESSINGS) ×2 IMPLANT
CANISTER SUCT 3000ML PPV (MISCELLANEOUS) ×3 IMPLANT
CAP PIN ORTHO PINK (CAP) ×2 IMPLANT
COUNTERSINK MULTI SCREW (BIT) ×3
COVER SURGICAL LIGHT HANDLE (MISCELLANEOUS) ×3 IMPLANT
CUFF TOURNIQUET SINGLE 44IN (TOURNIQUET CUFF) IMPLANT
DRAPE C-ARM MINI 42X72 WSTRAPS (DRAPES) ×2 IMPLANT
DRAPE IMP U-DRAPE 54X76 (DRAPES) ×3 IMPLANT
DRAPE U-SHAPE 47X51 STRL (DRAPES) ×3 IMPLANT
DRSG ADAPTIC 3X8 NADH LF (GAUZE/BANDAGES/DRESSINGS) ×2 IMPLANT
ELECT REM PT RETURN 9FT ADLT (ELECTROSURGICAL) ×3
ELECTRODE REM PT RTRN 9FT ADLT (ELECTROSURGICAL) ×1 IMPLANT
FACESHIELD WRAPAROUND (MASK) ×3 IMPLANT
FACESHIELD WRAPAROUND OR TEAM (MASK) ×1 IMPLANT
GAUZE SPONGE 4X4 12PLY STRL (GAUZE/BANDAGES/DRESSINGS) ×3 IMPLANT
GLOVE SKINSENSE NS SZ7.5 (GLOVE) ×2
GLOVE SKINSENSE STRL SZ7.5 (GLOVE) ×1 IMPLANT
GLOVE SURG SYN 7.5  E (GLOVE) ×4
GLOVE SURG SYN 7.5 E (GLOVE) ×2 IMPLANT
GLOVE SURG SYN 7.5 PF PI (GLOVE) ×2 IMPLANT
GOWN STRL REIN XL XLG (GOWN DISPOSABLE) ×3 IMPLANT
KIT BASIN OR (CUSTOM PROCEDURE TRAY) ×3 IMPLANT
KIT ROOM TURNOVER OR (KITS) ×3 IMPLANT
NDL HYPO 25GX1X1/2 BEV (NEEDLE) IMPLANT
NEEDLE HYPO 25GX1X1/2 BEV (NEEDLE) IMPLANT
NS IRRIG 1000ML POUR BTL (IV SOLUTION) ×3 IMPLANT
PACK ORTHO EXTREMITY (CUSTOM PROCEDURE TRAY) ×3 IMPLANT
PAD ARMBOARD 7.5X6 YLW CONV (MISCELLANEOUS) ×6 IMPLANT
PAD CAST 3X4 CTTN HI CHSV (CAST SUPPLIES) ×2 IMPLANT
PADDING CAST COTTON 3X4 STRL (CAST SUPPLIES) ×6
PADDING CAST COTTON 6X4 STRL (CAST SUPPLIES) ×3 IMPLANT
PADDING CAST SYNTHETIC 3 NS LF (CAST SUPPLIES) ×2
PADDING CAST SYNTHETIC 3X4 NS (CAST SUPPLIES) IMPLANT
SCREW BONE LAG 2.3X10MM HEXA (Screw) IMPLANT
SCREW BONE LAG 2.3X9MM HEXAQ (Screw) IMPLANT
SCREW BONE NL 2.3X14MM HEXA (Screw) IMPLANT
SCREW COUNTERSINK MULTI SCREW (BIT) IMPLANT
SCREW LAG 2.3X10MM (Screw) ×3 IMPLANT
SCREW LAG 2.3X9MM (Screw) ×6 IMPLANT
SCREW NON LOCK 2.3X14 (Screw) ×3 IMPLANT
SCREW NONLOCK 2.3X13MM (Screw) ×2 IMPLANT
SCREW NONLOCK TI 2.3X11 (Screw) ×4 IMPLANT
SPONGE LAP 18X18 X RAY DECT (DISPOSABLE) ×2 IMPLANT
SUCTION FRAZIER HANDLE 10FR (MISCELLANEOUS) ×2
SUCTION TUBE FRAZIER 10FR DISP (MISCELLANEOUS) ×1 IMPLANT
SUT ETHILON 4 0 P 3 18 (SUTURE) ×4 IMPLANT
TOWEL OR 17X24 6PK STRL BLUE (TOWEL DISPOSABLE) ×3 IMPLANT
TOWEL OR 17X26 10 PK STRL BLUE (TOWEL DISPOSABLE) ×6 IMPLANT
TUBE CONNECTING 12'X1/4 (SUCTIONS) ×1
TUBE CONNECTING 12X1/4 (SUCTIONS) ×2 IMPLANT
TUBING CYSTO DISP (UROLOGICAL SUPPLIES) ×2 IMPLANT
WATER STERILE IRR 1000ML POUR (IV SOLUTION) ×3 IMPLANT

## 2016-04-04 NOTE — Progress Notes (Signed)
    SUBJECTIVE:    No SOB .  No acute complaints overnight.  PHYSICAL EXAM Filed Vitals:   04/04/16 0400 04/04/16 0743 04/04/16 0748 04/04/16 0800  BP: 167/93  170/94 158/86  Pulse: 84  80 86  Temp:   98.1 F (36.7 C) 98.1 F (36.7 C)  TempSrc:   Oral Oral  Resp: 17  18 26   Height:      Weight:      SpO2: 97% 97% 98% 97%   General:  No acute distress Lungs:  Decreased breath sounds with diffuse wheezing Heart:  RRR Abdomen:  Positive bowel sounds, no rebound no guarding Extremities:  No edema  LABS:  No results found for this or any previous visit (from the past 24 hour(s)).  Intake/Output Summary (Last 24 hours) at 04/04/16 1013 Last data filed at 04/04/16 0918  Gross per 24 hour  Intake 589.08 ml  Output   1325 ml  Net -735.92 ml    ASSESSMENT AND PLAN:  CAD:  Chronic total circ occlusion.  Patent LAD stent.  Plan medical management of residual disease.      SUBDURAL HEMATOMA:   Stable.  NS has signed off.   PTX:  Resolved.   Chest tube out.    CARDIAC ARREST:   On amiodarone for now.    EP saw in consultation.  They will follow for timing of ICD replacement.    THROMBOCYTOPENIA:   Stable  VENTRICULAR TACHYCARDIA:    As above.   TRAUMA:  Will have ORIF today.   CARDIOMYOPATHY:    Seems to be euvolemic.   Will titrate Coreg and lisinopril today.    HYPONATREMIA:  Improved.  Follow.    Jeneen Rinks [facility] 04/04/2016 10:13 AM
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
## 2016-04-04 NOTE — Anesthesia Postprocedure Evaluation (Signed)
Anesthesia Post Note  Patient: Rande Kerstein  Procedure(s) Performed: Procedure(s) (LRB): OPEN REDUCTION INTERNAL FIXATION (ORIF) LEFT 2ND, 3RD, 4TH METACARPAL FRACTURE (Left)  Patient location during evaluation: PACU Anesthesia Type: General Level of consciousness: awake and alert Pain management: pain level controlled Vital Signs Assessment: post-procedure vital signs reviewed and stable Respiratory status: spontaneous breathing, nonlabored ventilation, respiratory function stable and patient connected to nasal cannula oxygen Cardiovascular status: blood pressure returned to baseline and stable Postop Assessment: no signs of nausea or vomiting Anesthetic complications: no    Last Vitals:  Filed Vitals:   04/04/16 1830 04/04/16 1845  BP: 104/55 104/57  Pulse: 65 63  Temp: 36.5 C   Resp: 11 11    Last Pain:  Filed Vitals:   04/04/16 2003  PainSc: 0-No pain                 Tequisha Maahs DAVID

## 2016-04-04 NOTE — Anesthesia Procedure Notes (Signed)
Procedure Name: Intubation Date/Time: 04/04/2016 4:40 PM Performed by: Rebekah Chesterfield L Pre-anesthesia Checklist: Patient identified, Emergency Drugs available, Suction available and Patient being monitored Patient Re-evaluated:Patient Re-evaluated prior to inductionOxygen Delivery Method: Circle System Utilized Preoxygenation: Pre-oxygenation with 100% oxygen Intubation Type: IV induction Ventilation: Mask ventilation without difficulty Laryngoscope Size: Mac and 4 Grade View: Grade II Tube type: Oral Tube size: 7.5 mm Number of attempts: 1 Airway Equipment and Method: Stylet Placement Confirmation: ETT inserted through vocal cords under direct vision,  positive ETCO2 and breath sounds checked- equal and bilateral Secured at: 21 cm Tube secured with: Tape Dental Injury: Teeth and Oropharynx as per pre-operative assessment
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
## 2016-04-04 NOTE — Transfer of Care (Signed)
Immediate Anesthesia Transfer of Care Note  Patient: [name]  Procedure(s) Performed: Procedure(s) with comments: OPEN REDUCTION INTERNAL FIXATION (ORIF) LEFT 2ND, 3RD, 4TH METACARPAL FRACTURE (Left) - OPEN REDUCTION INTERNAL FIXATION (ORIF) LEFT 2ND, 3RD, 4TH METACARPAL FRACTURE  Patient Location: PACU  Anesthesia Type:General  Level of Consciousness: awake, alert  and patient cooperative  Airway & Oxygen Therapy: Patient Spontanous Breathing and Patient connected to nasal cannula oxygen  Post-op Assessment: Report given to RN and Post -op Vital signs reviewed and stable  Post vital signs: Reviewed and stable  Last Vitals:  Filed Vitals:   04/04/16 0800 04/04/16 1121  BP: 158/86 154/84  Pulse: 86 80  Temp: 36.7 C 36.5 C  Resp: 26 25    Last Pain:  Filed Vitals:   04/04/16 1207  PainSc: 0-No pain      Patients Stated Pain Goal: 3 ([id number])  Complications: No apparent anesthesia complications

## 2016-04-04 NOTE — Op Note (Signed)
   Date of Surgery: 04/04/2016  INDICATIONS: [name] is a 82 y.o.-year-old male with a left open metacarpal fractures;  The patient and family did consent to the procedure after discussion of the risks and benefits.  PREOPERATIVE DIAGNOSIS:  1. Left type 2 open 2nd metacarpal fracture 2. Left closed 3rd, 4th, 5th metacarpal fractures  POSTOPERATIVE DIAGNOSIS: Same.  PROCEDURE:  1. Irrigation and debridement of left 2nd metacarpal fracture associated with open fracture 2. Open reduction internal fixation of 2nd and 3rd metacarpal fractures 3. Percutaneous fixation of 4th and 5th metacarpal fractures 4. Closed treatment of 3rd finger proximal phalanx fracture 5. Adjacent tissue rearrangement of left hand 2 cm  SURGEON: N. Eduard Roux, M.D.  ASSIST: April Green, RNFA.  ANESTHESIA:  general  IV FLUIDS AND URINE: See anesthesia.  ESTIMATED BLOOD LOSS: minimal mL.  IMPLANTS: Acumed 2.3 mm screws, 0.062 K wire x 2  DRAINS: none  COMPLICATIONS: None.  DESCRIPTION OF PROCEDURE: The patient was brought to the operating room and placed supine on the operating table.  The patient had been signed prior to the procedure and this was documented. The patient had the anesthesia placed by the anesthesiologist.  A time-out was performed to confirm that this was the correct patient, site, side and location. The patient did receive antibiotics prior to the incision and was re-dosed during the procedure as needed at indicated intervals.  A tourniquet placed and inflated to 250 mm Hg.  The patient had the operative extremity prepped and draped in the standard surgical fashion.    The traumatic laceration over the 2nd metacarpal was extended both proximally and distally.  There was a scant amount of frank pus.  Sharp excisional debridement of nonviable bone, skin, muscle was performed with a knife and rongeur.  There was no more pus other than what was present superficially.  After thorough debridement,  The wound was irrigated thoroughly.  We turned our attention to fixing the fractures.  First we obtained a reduction of the 2nd metacarpal.  With the fracture reduced, I placed 3 lag screws across the fracture each screw with excellent purchase.  Fluoroscopy was used to confirm appropriate reduction and screw placement.  I then made a dorsal incision over the 3rd metacarpal.  Blunt dissection was performed down to the bone.  The periosteum was elevated and the fracture was exposed.  Organized hematoma was removed from the fracture site.  The fracture was reduced and fixed in a similar fashion as the 2nd metacarpal with 3 lag screws.  Fluoroscopy was again used.  We then performed percutaneous fixation with 0.062 K wire for 4th and 5th metacarpal fractures in a retrograde fashion using fluoroscopy.  The wounds were then thoroughly irrigated again.  I performed adjacent tissue rearrangement in order to close the traumatic laceration over the 2nd metacarpal.  The wounds were closed with 4.0 nylon.  Bacitracin ointment was placed on the incisions.  Sterile dressings were applied.  The hand was placed in a dorsal blocking splint in intrinsic plus.  Patient tolerated the procedure well.    POSTOPERATIVE PLAN: He will be platform weight bearing to the left upper extremity.  He will need to be on bactrim DS tab BID x 2 weeks for his left hand infection.    Azucena Cecil, MD [place] 8:51 PM
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
## 2016-04-04 NOTE — Anesthesia Preprocedure Evaluation (Addendum)
Anesthesia Evaluation  Patient identified by MRN, date of birth, ID band Patient awake    Reviewed: Allergy & Precautions, H&P , NPO status , Patient's Chart, lab work & pertinent test results  History of Anesthesia Complications Negative for: history of anesthetic complications  Airway Mallampati: II  TM Distance: >3 FB Neck ROM: full    Dental no notable dental hx.    Pulmonary COPD, former smoker,    Pulmonary exam normal breath sounds clear to auscultation       Cardiovascular hypertension, Pt. on medications + CAD  Normal cardiovascular exam+ dysrhythmias Ventricular Tachycardia + pacemaker + Cardiac Defibrillator  Rhythm:regular Rate:Normal  Echo 6/17 with moderate decreased EF at 35%, no significant valve abnormalities  Cath 2017 with chronically occluded left circ artery, patent LAD stent, has cardiac clearance for ORIF of left hand   Neuro/Psych negative neurological ROS     GI/Hepatic negative GI ROS, Neg liver ROS,   Endo/Other  negative endocrine ROS  Renal/GU negative Renal ROS     Musculoskeletal   Abdominal   Peds  Hematology negative hematology ROS (+)   Anesthesia Other Findings   Reproductive/Obstetrics negative OB ROS                            Anesthesia Physical Anesthesia Plan  ASA: III  Anesthesia Plan: General   Post-op Pain Management:    Induction: Intravenous  Airway Management Planned: Oral ETT  Additional Equipment:   Intra-op Plan:   Post-operative Plan: Possible Post-op intubation/ventilation  Informed Consent: I have reviewed the patients History and Physical, chart, labs and discussed the procedure including the risks, benefits and alternatives for the proposed anesthesia with the patient or authorized representative who has indicated his/her understanding and acceptance.   Dental Advisory Given  Plan Discussed with: Anesthesiologist,  CRNA and Surgeon  Anesthesia Plan Comments:         Anesthesia Quick Evaluation
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
## 2016-04-04 NOTE — Progress Notes (Signed)
Tomi Bamberger called this nurse back, paged per Dr. Lennox Grumbles request. Will come see pt as soon as possible but would use magnet. Emergency orders placed on chart.

## 2016-04-04 NOTE — H&P (Signed)

## 2016-04-05 ENCOUNTER — Inpatient Hospital Stay (HOSPITAL_COMMUNITY): Payer: Medicare Other

## 2016-04-05 DIAGNOSIS — I421 Obstructive hypertrophic cardiomyopathy: Secondary | ICD-10-CM

## 2016-04-05 DIAGNOSIS — I454 Nonspecific intraventricular block: Secondary | ICD-10-CM

## 2016-04-05 LAB — ECHOCARDIOGRAM COMPLETE
AO mean calculated velocity dopler: 140 cm/s
AOASC: 38 cm
AOVTI: 48.9 cm
AV Area VTI: 1.43 cm2
AV Area mean vel: 1.35 cm2
AV VEL mean LVOT/AV: 0.33
AV area mean vel ind: 0.63 cm2/m2
AV vel: 1.53
AVAREAVTIIND: 0.71 cm2/m2
AVG: 9 mmHg
AVLVOTPG: 2 mmHg
AVPG: 18 mmHg
AVPKVEL: 213 cm/s
Ao pk vel: 0.34 m/s
CHL CUP AV PEAK INDEX: 0.66
CHL CUP AV VALUE AREA INDEX: 0.71
CHL CUP MV DEC (S): 289
CHL CUP RV SYS PRESS: 30 mmHg
EERAT: 10.91
EWDT: 289 ms
FS: 17 % — AB (ref 28–44)
Height: 72 in
IV/PV OW: 1.14
LA vol index: 24.4 mL/m2
LA vol: 52.6 mL
LADIAMINDEX: 1.53 cm/m2
LASIZE: 33 mm
LAVOLA4C: 64.5 mL
LDCA: 4.15 cm2
LEFT ATRIUM END SYS DIAM: 33 mm
LV PW d: 10.3 mm — AB (ref 0.6–1.1)
LV TDI E'MEDIAL: 3.7
LVEEAVG: 10.91
LVEEMED: 10.91
LVELAT: 7.29 cm/s
LVOT SV: 75 mL
LVOT VTI: 18 cm
LVOT diameter: 23 mm
LVOT peak VTI: 0.37 cm
LVOT peak vel: 73.2 cm/s
Lateral S' vel: 15.4 cm/s
MV pk A vel: 112 m/s
MVPG: 3 mmHg
MVPKEVEL: 79.5 m/s
Reg peak vel: 260 cm/s
TAPSE: 20.9 mm
TDI e' lateral: 7.29
TR max vel: 260 cm/s
Valve area: 1.53 cm2
Weight: 3174.62 oz

## 2016-04-05 LAB — CBC WITH DIFFERENTIAL/PLATELET
BASOS PCT: 0 %
Basophils Absolute: 0 10*3/uL (ref 0.0–0.1)
Eosinophils Absolute: 0.3 10*3/uL (ref 0.0–0.7)
Eosinophils Relative: 3 %
HEMATOCRIT: 36.9 % — AB (ref 39.0–52.0)
HEMOGLOBIN: 12.5 g/dL — AB (ref 13.0–17.0)
LYMPHS ABS: 1.2 10*3/uL (ref 0.7–4.0)
Lymphocytes Relative: 10 %
MCH: 33.3 pg (ref 26.0–34.0)
MCHC: 33.9 g/dL (ref 30.0–36.0)
MCV: 98.4 fL (ref 78.0–100.0)
MONO ABS: 1.4 10*3/uL — AB (ref 0.1–1.0)
MONOS PCT: 12 %
NEUTROS ABS: 8.3 10*3/uL — AB (ref 1.7–7.7)
NEUTROS PCT: 75 %
Platelets: 131 10*3/uL — ABNORMAL LOW (ref 150–400)
RBC: 3.75 MIL/uL — ABNORMAL LOW (ref 4.22–5.81)
RDW: 14.2 % (ref 11.5–15.5)
WBC: 11.2 10*3/uL — ABNORMAL HIGH (ref 4.0–10.5)

## 2016-04-05 LAB — BASIC METABOLIC PANEL
ANION GAP: 9 (ref 5–15)
BUN: 25 mg/dL — AB (ref 6–20)
CHLORIDE: 97 mmol/L — AB (ref 101–111)
CO2: 29 mmol/L (ref 22–32)
Calcium: 9.1 mg/dL (ref 8.9–10.3)
Creatinine, Ser: 0.81 mg/dL (ref 0.61–1.24)
GFR calc Af Amer: >60 mL/min (ref >60)
GLUCOSE: 89 mg/dL (ref 65–99)
POTASSIUM: 4 mmol/L (ref 3.5–5.1)
SODIUM: 135 mmol/L (ref 135–145)

## 2016-04-05 LAB — MAGNESIUM: MAGNESIUM: 1.7 mg/dL (ref 1.7–2.4)

## 2016-04-05 LAB — T4: T4, Total: 6.6 ug/dL (ref 4.5–12.0)

## 2016-04-05 LAB — T3: T3 TOTAL: 64 ng/dL — AB (ref 71–180)

## 2016-04-05 MED ORDER — MAGNESIUM OXIDE 400 (241.3 MG) MG PO TABS
400.0000 mg | ORAL_TABLET | Freq: Once | ORAL | Status: AC
  Administered 2016-04-05: 400 mg via ORAL
  Filled 2016-04-05: qty 1

## 2016-04-05 MED ORDER — CHLORHEXIDINE GLUCONATE 4 % EX LIQD
60.0000 mL | Freq: Once | CUTANEOUS | Status: DC
  Filled 2016-04-05: qty 60

## 2016-04-05 MED ORDER — VITAMIN B-1 100 MG PO TABS
100.0000 mg | ORAL_TABLET | Freq: Every day | ORAL | Status: DC
  Administered 2016-04-05 – 2016-04-09 (×5): 100 mg via ORAL
  Filled 2016-04-05 (×5): qty 1

## 2016-04-05 MED ORDER — LEVOTHYROXINE SODIUM 25 MCG PO TABS
25.0000 ug | ORAL_TABLET | Freq: Every day | ORAL | Status: DC
  Administered 2016-04-05 – 2016-04-09 (×5): 25 ug via ORAL
  Filled 2016-04-05 (×5): qty 1

## 2016-04-05 MED ORDER — SODIUM CHLORIDE 0.9 % IV SOLN
INTRAVENOUS | Status: DC
  Administered 2016-04-06: 06:00:00 via INTRAVENOUS

## 2016-04-05 MED ORDER — PERFLUTREN LIPID MICROSPHERE
1.0000 mL | INTRAVENOUS | Status: AC | PRN
  Administered 2016-04-05: 2 mL via INTRAVENOUS
  Filled 2016-04-05: qty 10

## 2016-04-05 MED ORDER — CHLORHEXIDINE GLUCONATE 4 % EX LIQD
60.0000 mL | Freq: Once | CUTANEOUS | Status: AC
  Administered 2016-04-05: 4 via TOPICAL
  Filled 2016-04-05 (×2): qty 60

## 2016-04-05 MED ORDER — LEVOTHYROXINE SODIUM 100 MCG IV SOLR: 12.5000 ug | Freq: Every day | INTRAVENOUS | Status: DC

## 2016-04-05 MED ORDER — CEFAZOLIN SODIUM-DEXTROSE 2-4 GM/100ML-% IV SOLN
2.0000 g | INTRAVENOUS | Status: AC
  Administered 2016-04-06: 2 g via INTRAVENOUS
  Filled 2016-04-05: qty 100

## 2016-04-05 MED ORDER — GENTAMICIN SULFATE 40 MG/ML IJ SOLN
80.0000 mg | INTRAMUSCULAR | Status: AC
  Administered 2016-04-06: 80 mg
  Filled 2016-04-05: qty 2

## 2016-04-05 MED ORDER — FUROSEMIDE 10 MG/ML IJ SOLN: 40.0000 mg | Freq: Two times a day (BID) | INTRAMUSCULAR | Status: DC

## 2016-04-05 MED ORDER — FUROSEMIDE 10 MG/ML IJ SOLN
40.0000 mg | Freq: Two times a day (BID) | INTRAMUSCULAR | Status: AC
  Filled 2016-04-05: qty 4
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
## 2016-04-05 NOTE — Progress Notes (Signed)
Patients bleeding at two different sites on left ORIF dressing. Patients vitals signs are stable, has good sensation and can move fingers. Patient concerned with bleeding. Notified MD. Told to reinforce dressing and that MD would change dressing in AM. Dressing reinforced and will continue to monitor.
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
## 2016-04-05 NOTE — Progress Notes (Signed)
   Subjective:  Patient reports pain as mild.  Stable.  Objective:   VITALS:   Filed Vitals:   04/05/16 0010 04/05/16 0350 04/05/16 0351 04/05/16 0902  BP: 139/75 124/73  140/77  Pulse: 65 62  75  Temp:   97.4 F (36.3 C) 97.2 F (36.2 C)  TempSrc:   Oral Oral  Resp: 19 21    Height:      Weight:      SpO2: 93% 91%  93%    Dry blood on surgical dressings - stable Fingers wwp Splint on   Lab Results  Component Value Date   WBC 11.2* 04/05/2016   HGB 12.5* 04/05/2016   HCT 36.9* 04/05/2016   MCV 98.4 04/05/2016   PLT 131* 04/05/2016     Assessment/Plan:  1 Day Post-Op   - Platform WB to LUE - splint at all times - bactrim DS BID x 14 days for superficial soft tissue infection - will change dressings tomorrow  Marianna Payment 04/05/2016, 9:34 AM [phone]

## 2016-04-05 NOTE — Progress Notes (Signed)
Echocardiogram 2D Echocardiogram has been performed.  [name] 04/05/2016, 2:15 PM

## 2016-04-05 NOTE — Clinical Documentation Improvement (Signed)
Hospitalist   Please document query responses in the progress notes and discharge summary, not on the CDI BPA form in [facility]. Thank you!  CMS guidelines require an attending provider document the Location and POA status of pressure ulcers for inpatients.  "Pressure Ulcer" is documented in the MD progress notes starting 03/30/16 and in subsequent daily progress notes.  A Stage 1 Sacral Ulcer, present on admission" is documented by Nursing in the [place].  If you agree with the documentation of pressure ulcer and the nursing assessment, please document the Location and POA status in the progress notes and discharge summary.    Please exercise your independent, professional judgment when responding. A specific answer is not anticipated or expected.   Thank You, Erling Conte  RN BSN CCDS [phone] Health Information Management [place]
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
## 2016-04-05 NOTE — Progress Notes (Signed)
Rehab admissions - I met with patient at the bedside.  He tells me that he needs to have batteries replaced tomorrow in his pacemaker.  I spoke to him about inpatient rehab and he would like to admit to inpatient rehab.  Once all procedures and surgeries are completed, then can consider for potential acute inpatient rehab admission.  Call me for questions.  #[phone]

## 2016-04-05 NOTE — Progress Notes (Signed)
Patients bp 70/57 with HR 63. Patient reports dizziness when standing. Notified MD. Will hold lisinopril, lasix, and amiodarone. Got patient back into the bed and will cycle bp.  Current bp at 1210 121/63. Will continue to monitor.

## 2016-04-05 NOTE — Care Management Important Message (Signed)
Important Message  Patient Details  Name: [name] MRN: [id number] Date of Birth: 10-06-1931   Medicare Important Message Given:  Yes    Nathen May 04/05/2016, 11:16 AM

## 2016-04-06 ENCOUNTER — Encounter (HOSPITAL_COMMUNITY): Payer: Self-pay | Admitting: Internal Medicine

## 2016-04-06 ENCOUNTER — Encounter (HOSPITAL_COMMUNITY)
Admission: EM | Disposition: A | Discharge status: Rehabilitation Facility | Payer: Self-pay | Source: Home / Self Care | Attending: Internal Medicine

## 2016-04-06 DIAGNOSIS — Z4502 Encounter for adjustment and management of automatic implantable cardiac defibrillator: Secondary | ICD-10-CM

## 2016-04-06 DIAGNOSIS — I472 Ventricular tachycardia: Secondary | ICD-10-CM

## 2016-04-06 HISTORY — PX: EP IMPLANTABLE DEVICE: SHX172B

## 2016-04-06 LAB — BASIC METABOLIC PANEL
ANION GAP: 6 (ref 5–15)
BUN: 20 mg/dL (ref 6–20)
CO2: 28 mmol/L (ref 22–32)
Calcium: 8.6 mg/dL — ABNORMAL LOW (ref 8.9–10.3)
Chloride: 96 mmol/L — ABNORMAL LOW (ref 101–111)
Creatinine, Ser: 0.94 mg/dL (ref 0.61–1.24)
GFR calc Af Amer: >60 mL/min (ref >60)
Glucose, Bld: 110 mg/dL — ABNORMAL HIGH (ref 65–99)
POTASSIUM: 4.3 mmol/L (ref 3.5–5.1)
SODIUM: 130 mmol/L — AB (ref 135–145)

## 2016-04-06 LAB — SURGICAL PCR SCREEN
MRSA, PCR: NEGATIVE
STAPHYLOCOCCUS AUREUS: NEGATIVE

## 2016-04-06 LAB — MAGNESIUM: MAGNESIUM: 1.6 mg/dL — AB (ref 1.7–2.4)

## 2016-04-06 SURGERY — ICD/BIV ICD GENERATOR CHANGEOUT

## 2016-04-06 MED ORDER — ONDANSETRON HCL 4 MG/2ML IJ SOLN: 4.0000 mg | Freq: Once | INTRAMUSCULAR | Status: DC | PRN

## 2016-04-06 MED ORDER — ONDANSETRON HCL 4 MG/2ML IJ SOLN: 4.0000 mg | Freq: Four times a day (QID) | INTRAMUSCULAR | Status: DC | PRN

## 2016-04-06 MED ORDER — HYDROMORPHONE HCL 1 MG/ML IJ SOLN: 0.2500 mg | INTRAMUSCULAR | Status: DC | PRN

## 2016-04-06 MED ORDER — CLOPIDOGREL BISULFATE 75 MG PO TABS
75.0000 mg | ORAL_TABLET | Freq: Every day | ORAL | Status: DC
  Administered 2016-04-06 – 2016-04-09 (×4): 75 mg via ORAL
  Filled 2016-04-06 (×4): qty 1

## 2016-04-06 MED ORDER — HEPARIN (PORCINE) IN NACL 2-0.9 UNIT/ML-% IJ SOLN
INTRAMUSCULAR | Status: AC
  Filled 2016-04-06: qty 1000

## 2016-04-06 MED ORDER — MAGNESIUM OXIDE 400 (241.3 MG) MG PO TABS
400.0000 mg | ORAL_TABLET | Freq: Two times a day (BID) | ORAL | Status: AC
Start: 2016-04-06 — End: 2016-04-07
  Administered 2016-04-06 – 2016-04-07 (×2): 400 mg via ORAL
  Filled 2016-04-06 (×2): qty 1

## 2016-04-06 MED ORDER — ACETAMINOPHEN 325 MG PO TABS: 325.0000 mg | ORAL_TABLET | ORAL | Status: DC | PRN

## 2016-04-06 MED ORDER — LIDOCAINE HCL (PF) 1 % IJ SOLN
INTRAMUSCULAR | Status: AC
  Filled 2016-04-06: qty 30

## 2016-04-06 MED ORDER — MAGNESIUM SULFATE 2 GM/50ML IV SOLN: 2.0000 g | Freq: Once | INTRAVENOUS | Status: DC

## 2016-04-06 MED ORDER — HYDROCODONE-ACETAMINOPHEN 7.5-325 MG PO TABS: 1.0000 | ORAL_TABLET | Freq: Once | ORAL | Status: DC | PRN

## 2016-04-06 MED ORDER — SODIUM CHLORIDE 0.9 % IV SOLN: INTRAVENOUS | Status: AC

## 2016-04-06 MED ORDER — MIDAZOLAM HCL 5 MG/5ML IJ SOLN
INTRAMUSCULAR | Status: AC
  Filled 2016-04-06: qty 5

## 2016-04-06 MED ORDER — CEFAZOLIN SODIUM-DEXTROSE 2-4 GM/100ML-% IV SOLN
INTRAVENOUS | Status: AC
  Filled 2016-04-06: qty 100

## 2016-04-06 MED ORDER — LIDOCAINE HCL (PF) 1 % IJ SOLN
INTRAMUSCULAR | Status: DC | PRN
  Administered 2016-04-06: 22 mL

## 2016-04-06 MED ORDER — FENTANYL CITRATE (PF) 100 MCG/2ML IJ SOLN
INTRAMUSCULAR | Status: AC
  Filled 2016-04-06: qty 2

## 2016-04-06 MED ORDER — SODIUM CHLORIDE 0.9 % IR SOLN
Status: AC
  Filled 2016-04-06: qty 2

## 2016-04-06 SURGICAL SUPPLY — 5 items
CABLE SURGICAL S-101-97-12 (CABLE) ×2 IMPLANT
HEMOSTAT SURGICEL 2X4 FIBR (HEMOSTASIS) ×2 IMPLANT
ICD EVERA XT DR DDBB1D1 (ICD Generator) ×2 IMPLANT
PAD DEFIB LIFELINK (PAD) ×2 IMPLANT
TRAY PACEMAKER INSERTION (PACKS) ×2 IMPLANT
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
## 2016-04-06 NOTE — Progress Notes (Signed)
   Subjective:  Patient reports pain as mild.  Stable.  Objective:   VITALS:   Filed Vitals:   04/05/16 2300 04/06/16 0345 04/06/16 0355 04/06/16 0500  BP: 115/63  138/66   Pulse: 68  65   Temp: 97.3 F (36.3 C) 97.3 F (36.3 C)    TempSrc: Oral Oral    Resp: 26  23   Height:      Weight:    88.7 kg (195 lb 8.8 oz)  SpO2: 98%  97%     Incisions c/d/i Fingers wwp   Lab Results  Component Value Date   WBC 11.2* 04/05/2016   HGB 12.5* 04/05/2016   HCT 36.9* 04/05/2016   MCV 98.4 04/05/2016   PLT 131* 04/05/2016     Assessment/Plan:  2 Days Post-Op   - Platform WB to LUE - splint at all times - bactrim DS BID x 14 days for superficial soft tissue infection - wounds stable - splint changed and bleeding was hemostatic - patient has been reassured  Marianna Payment 04/06/2016, 6:54 AM [phone]

## 2016-04-06 NOTE — Progress Notes (Signed)
Echo reviewed For ICD gen change Will not do upgrade  Has flail chest ..Marland Kitchen
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
## 2016-04-06 NOTE — Progress Notes (Signed)
    SUBJECTIVE:    No acute complaints.  No chest pain.  He has mild SOB but this is because his room is very hot.  He is lying flat.    PHYSICAL EXAM Filed Vitals:   04/05/16 2300 04/06/16 0345 04/06/16 0355 04/06/16 0500  BP: 115/63  138/66   Pulse: 68  65   Temp: 97.3 F (36.3 C) 97.3 F (36.3 C)    TempSrc: Oral Oral    Resp: 26  23   Height:      Weight:    195 lb 8.8 oz (88.7 kg)  SpO2: 98%  97%    General:  No acute distress Lungs:  Decreased breath sounds with diffuse wheezing Heart:  RRR Abdomen:  Positive bowel sounds, no rebound no guarding Extremities:  No edema  LABS:  No results found for this or any previous visit (from the past 24 hour(s)).  Intake/Output Summary (Last 24 hours) at 04/06/16 0634 Last data filed at 04/06/16 0355  Gross per 24 hour  Intake    346 ml  Output   1175 ml  Net   -829 ml    ASSESSMENT AND PLAN:  CAD:  Chronic total circ occlusion.  Patent LAD stent.  Plan medical management of residual disease.    CARDIAC ARREST:   On amiodarone for now.    For ICD generator exchange today.  THROMBOCYTOPENIA:   Improved  VENTRICULAR TACHYCARDIA:    As above.   TRAUMA:  ORIF earlier this week. Marland Kitchen   CARDIOMYOPATHY:    Received some Lasix yesterday.  Had repeat echo with EF unchanged at 35 - 40%.  Seems to be euvolemic this AM.  Continue current meds.    HYPONATREMIA:  Na is slightly lower today.  Follow.    Jeneen Rinks [facility] 04/06/2016 6:34 AM

## 2016-04-06 NOTE — Interval H&P Note (Signed)
ICD Criteria  Current LVEF:35%. Within 12 months prior to implant: Yes   Heart failure history: Yes, Class I  Cardiomyopathy history: Yes, Ischemic Cardiomyopathy.  Atrial Fibrillation/Atrial Flutter: No.  Ventricular tachycardia history: Yes, Hemodynamic instability present. VT Type: Sustained Ventricular Tachycardia - Monomorphic and Polymorphic.  Cardiac arrest history: No.  History of syndromes with risk of sudden death: No.  Previous ICD: Yes, Reason for ICD:  Primary prevention.  Current ICD indication: Secondary  PPM indication: No.   Class I or II Bradycardia indication present: No  Beta Blocker therapy for 3 or more months: Yes, prescribed.   Ace Inhibitor/ARB therapy for 3 or more months: Yes, prescribed.   History and Physical Interval Note:  04/06/2016 11:07 AM  [name]  has presented today for surgery, with the diagnosis of eol  The various methods of treatment have been discussed with the patient and family. After consideration of risks, benefits and other options for treatment, the patient has consented to  Procedure(s):  ICD [employer] (N/A) as a surgical intervention .  The patient's history has been reviewed, patient examined, no change in status, stable for surgery.  I have reviewed the patient's chart and labs.  Questions were answered to the patient's satisfaction.     [name]

## 2016-04-06 NOTE — Discharge Instructions (Signed)
DEFIBRILLATOR SITE WOUND CARE Keep defibrillator incision clean and dry for 10 days. You can remove outer dressing tomorrow. Leave steri-strips (little pieces of tape) on until seen in the office for wound check appointment. Call the office [phone]) for redness, drainage, swelling, or fever.    No driving for 6 months

## 2016-04-06 NOTE — Progress Notes (Signed)
PT Cancellation Note  Patient Details Name: [name] MRN: [id number] DOB: 29-Jun-1931   Cancelled Treatment:    Reason Eval/Treat Not Completed: Patient at procedure or test/unavailable.     [name] 04/06/2016, 11:09 AM Alben Deeds, PT DPT  [phone]

## 2016-04-06 NOTE — Clinical Social Work Note (Signed)
CSW continues to follow for discharge needs.  Ladislaus Repsher, CSW [phone]  
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
## 2016-04-07 LAB — GLUCOSE, CAPILLARY: Glucose-Capillary: 140 mg/dL — ABNORMAL HIGH (ref 65–99)

## 2016-04-07 LAB — BASIC METABOLIC PANEL
Anion gap: 9 (ref 5–15)
BUN: 19 mg/dL (ref 6–20)
CHLORIDE: 95 mmol/L — AB (ref 101–111)
CO2: 25 mmol/L (ref 22–32)
Calcium: 8.6 mg/dL — ABNORMAL LOW (ref 8.9–10.3)
Creatinine, Ser: 0.95 mg/dL (ref 0.61–1.24)
GFR calc non Af Amer: >60 mL/min (ref >60)
Glucose, Bld: 92 mg/dL (ref 65–99)
POTASSIUM: 4.5 mmol/L (ref 3.5–5.1)
SODIUM: 129 mmol/L — AB (ref 135–145)

## 2016-04-07 LAB — CBC
HEMATOCRIT: 32.9 % — AB (ref 39.0–52.0)
HEMOGLOBIN: 11 g/dL — AB (ref 13.0–17.0)
MCH: 32.5 pg (ref 26.0–34.0)
MCHC: 33.4 g/dL (ref 30.0–36.0)
MCV: 97.3 fL (ref 78.0–100.0)
Platelets: 164 10*3/uL (ref 150–400)
RBC: 3.38 MIL/uL — AB (ref 4.22–5.81)
RDW: 14.2 % (ref 11.5–15.5)
WBC: 8.4 10*3/uL (ref 4.0–10.5)

## 2016-04-07 LAB — MAGNESIUM: MAGNESIUM: 1.8 mg/dL (ref 1.7–2.4)

## 2016-04-07 NOTE — Progress Notes (Signed)
Pt refuses to be turned, he will allow [place] to pull him up in the bed but will not allow [place] to place a pillow to turn him side to side. Pt also refuses to get up to chair at this time. Consuelo Pandy RN

## 2016-04-07 NOTE — Progress Notes (Addendum)
Subjective:  C/o mild dyspnea last night.  Feels fine now  Objective:  Vital Signs in the last 24 hours: BP 123/64 mmHg  Pulse 62  Temp(Src) 98.1 F (36.7 C) (Oral)  Resp 17  Ht 6' (1.829 m)  Wt 89.5 kg (197 lb 5 oz)  BMI 26.75 kg/m2  SpO2 92%  Physical Exam: Pleasant elderly WM in NAD Lungs:  Paradoxical movement of sternum, mild wheeze, ecchymoses chest, defib site with only mild swelling  Cardiac:  Regular rhythm, normal S1 and S2, no S3 Abdomen:  Soft, nontender, no masses Extremities:  No edema present  Intake/Output from previous day: 06/16 0701 - 06/17 0700 In: 1829.7 [P.O.:960; I.V.:69.7] Out: 1700 [Urine:1700] Weight Filed Weights   04/04/16 0800 04/06/16 0500 04/07/16 0334  Weight: 90 kg (198 lb 6.6 oz) 88.7 kg (195 lb 8.8 oz) 89.5 kg (197 lb 5 oz)    Lab Results: Basic Metabolic Panel:  Recent Labs  04/06/16 0527 04/07/16 0447  NA 130* 129*  K 4.3 4.5  CL 96* 95*  CO2 28 25  GLUCOSE 110* 92  BUN 20 19  CREATININE 0.94 0.95    CBC:  Recent Labs  04/05/16 0301 04/07/16 0447  WBC 11.2* 8.4  NEUTROABS 8.3*  --   HGB 12.5* 11.0*  HCT 36.9* 32.9*  MCV 98.4 97.3  PLT 131* 164    Telemetry: Paced atrial rhythm sinus  Assessment/Plan:  1.  Prior ventricular tachycardia with cardiac arrest currently on amiodarone-defibrillator changed out yesterday for elective replacement 2.  Coronary artery disease with patent LAD stent and occluded RCA 3.  Ischemic cardiomyopathy 4.  Ventricular tachycardia [place] 5.  Flail chest  Recommendations:  Appears to be slowly improving but still has a flail chest.  Cardiac status appears to be relatively stable now.   Kerry Hough  MD [facility]  04/07/2016, 10:52 AM
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
## 2016-04-08 LAB — BASIC METABOLIC PANEL
ANION GAP: 10 (ref 5–15)
BUN: 17 mg/dL (ref 6–20)
CHLORIDE: 94 mmol/L — AB (ref 101–111)
CO2: 23 mmol/L (ref 22–32)
Calcium: 8.7 mg/dL — ABNORMAL LOW (ref 8.9–10.3)
Creatinine, Ser: 0.98 mg/dL (ref 0.61–1.24)
GFR calc Af Amer: >60 mL/min (ref >60)
Glucose, Bld: 97 mg/dL (ref 65–99)
POTASSIUM: 4.2 mmol/L (ref 3.5–5.1)
SODIUM: 127 mmol/L — AB (ref 135–145)

## 2016-04-08 LAB — MAGNESIUM: MAGNESIUM: 1.9 mg/dL (ref 1.7–2.4)
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
## 2016-04-08 NOTE — Progress Notes (Signed)
Pt will allow [place] to pull him up in the bed and pt will roll side to side so that pad can be changed and skin inspected but pt will not allow [place] to place pillows to keep him turned on side. Consuelo Pandy RN

## 2016-04-08 NOTE — Progress Notes (Signed)
Subjective:  C?o chest wall soreness, no chest pain  Objective:  Vital Signs in the last 24 hours: BP 122/78 mmHg  Pulse 63  Temp(Src) 97.8 F (36.6 C) (Oral)  Resp 19  Ht 6' (1.829 m)  Wt 89.3 kg (196 lb 13.9 oz)  BMI 26.69 kg/m2  SpO2 96%  Physical Exam: Pleasant elderly WM in NAD Lungs:  Paradoxical movement of sternum, mild wheeze, ecchymoses chest, defib site with only mild swelling  Cardiac:  Regular rhythm, normal S1 and S2, no S3 Extremities:  No edema present  Intake/Output from previous day: 06/17 0701 - 06/18 0700 In: 970 [P.O.:720; IV Piggyback:250] Out: 1551 [Urine:1550; Stool:1] Weight Filed Weights   04/06/16 0500 04/07/16 0334 04/08/16 0412  Weight: 88.7 kg (195 lb 8.8 oz) 89.5 kg (197 lb 5 oz) 89.3 kg (196 lb 13.9 oz)    Lab Results: Basic Metabolic Panel:  Recent Labs  04/07/16 0447 04/08/16 0523  NA 129* 127*  K 4.5 4.2  CL 95* 94*  CO2 25 23  GLUCOSE 92 97  BUN 19 17  CREATININE 0.95 0.98    CBC:  Recent Labs  04/07/16 0447  WBC 8.4  HGB 11.0*  HCT 32.9*  MCV 97.3  PLT 164    Telemetry:  rhythm sinus  Assessment/Plan:  1.  Prior ventricular tachycardia with cardiac arrest currently on amiodarone- 2.  Coronary artery disease with patent LAD stent and occluded RCA 3.  Ischemic cardiomyopathy 4.  Ventricular tachycardia 5.  Flail chest  Recommendations:   Cardiac status appears to be relatively stable now.Major issue now is recovery froj injuries.    Kerry Hough  MD [facility]  04/08/2016, 11:40 AM

## 2016-04-08 NOTE — Progress Notes (Signed)
Pt refuses to get up into chair today. Consuelo Pandy RN

## 2016-04-09 ENCOUNTER — Encounter: Payer: Self-pay | Admitting: Adult Health

## 2016-04-09 ENCOUNTER — Inpatient Hospital Stay (HOSPITAL_COMMUNITY)
Admission: RE | Admit: 2016-04-09 | Discharge: 2016-04-27 | DRG: 981 | Disposition: A | Discharge status: Home Health | Payer: Medicare Other | Source: Intra-hospital | Attending: Physical Medicine & Rehabilitation | Admitting: Physical Medicine & Rehabilitation

## 2016-04-09 DIAGNOSIS — R339 Retention of urine, unspecified: Secondary | ICD-10-CM | POA: Diagnosis present

## 2016-04-09 DIAGNOSIS — I7389 Other specified peripheral vascular diseases: Secondary | ICD-10-CM | POA: Diagnosis present

## 2016-04-09 DIAGNOSIS — I951 Orthostatic hypotension: Secondary | ICD-10-CM | POA: Diagnosis not present

## 2016-04-09 DIAGNOSIS — I712 Thoracic aortic aneurysm, without rupture, unspecified: Secondary | ICD-10-CM | POA: Diagnosis present

## 2016-04-09 DIAGNOSIS — Y838 Other surgical procedures as the cause of abnormal reaction of the patient, or of later complication, without mention of misadventure at the time of the procedure: Secondary | ICD-10-CM | POA: Diagnosis not present

## 2016-04-09 DIAGNOSIS — J449 Chronic obstructive pulmonary disease, unspecified: Secondary | ICD-10-CM | POA: Diagnosis present

## 2016-04-09 DIAGNOSIS — K5900 Constipation, unspecified: Secondary | ICD-10-CM | POA: Diagnosis present

## 2016-04-09 DIAGNOSIS — I5032 Chronic diastolic (congestive) heart failure: Secondary | ICD-10-CM | POA: Diagnosis not present

## 2016-04-09 DIAGNOSIS — I5043 Acute on chronic combined systolic (congestive) and diastolic (congestive) heart failure: Secondary | ICD-10-CM | POA: Diagnosis present

## 2016-04-09 DIAGNOSIS — D696 Thrombocytopenia, unspecified: Secondary | ICD-10-CM | POA: Diagnosis not present

## 2016-04-09 DIAGNOSIS — S62353D Nondisplaced fracture of shaft of third metacarpal bone, left hand, subsequent encounter for fracture with routine healing: Secondary | ICD-10-CM | POA: Diagnosis not present

## 2016-04-09 DIAGNOSIS — G931 Anoxic brain damage, not elsewhere classified: Secondary | ICD-10-CM | POA: Diagnosis not present

## 2016-04-09 DIAGNOSIS — S069X9A Unspecified intracranial injury with loss of consciousness of unspecified duration, initial encounter: Secondary | ICD-10-CM | POA: Diagnosis present

## 2016-04-09 DIAGNOSIS — S2221XD Fracture of manubrium, subsequent encounter for fracture with routine healing: Secondary | ICD-10-CM | POA: Diagnosis not present

## 2016-04-09 DIAGNOSIS — S069XAA Unspecified intracranial injury with loss of consciousness status unknown, initial encounter: Secondary | ICD-10-CM | POA: Insufficient documentation

## 2016-04-09 DIAGNOSIS — L03012 Cellulitis of left finger: Secondary | ICD-10-CM | POA: Diagnosis present

## 2016-04-09 DIAGNOSIS — Z8674 Personal history of sudden cardiac arrest: Secondary | ICD-10-CM

## 2016-04-09 DIAGNOSIS — I420 Dilated cardiomyopathy: Secondary | ICD-10-CM | POA: Diagnosis present

## 2016-04-09 DIAGNOSIS — S62321D Displaced fracture of shaft of second metacarpal bone, left hand, subsequent encounter for fracture with routine healing: Secondary | ICD-10-CM | POA: Diagnosis not present

## 2016-04-09 DIAGNOSIS — T148XXA Other injury of unspecified body region, initial encounter: Secondary | ICD-10-CM

## 2016-04-09 DIAGNOSIS — S069X1S Unspecified intracranial injury with loss of consciousness of 30 minutes or less, sequela: Secondary | ICD-10-CM | POA: Diagnosis not present

## 2016-04-09 DIAGNOSIS — T85628D Displacement of other specified internal prosthetic devices, implants and grafts, subsequent encounter: Secondary | ICD-10-CM

## 2016-04-09 DIAGNOSIS — I472 Ventricular tachycardia: Principal | ICD-10-CM | POA: Diagnosis present

## 2016-04-09 DIAGNOSIS — E039 Hypothyroidism, unspecified: Secondary | ICD-10-CM | POA: Insufficient documentation

## 2016-04-09 DIAGNOSIS — R350 Frequency of micturition: Secondary | ICD-10-CM | POA: Diagnosis not present

## 2016-04-09 DIAGNOSIS — E222 Syndrome of inappropriate secretion of antidiuretic hormone: Secondary | ICD-10-CM | POA: Insufficient documentation

## 2016-04-09 DIAGNOSIS — I252 Old myocardial infarction: Secondary | ICD-10-CM | POA: Diagnosis not present

## 2016-04-09 DIAGNOSIS — Z9889 Other specified postprocedural states: Secondary | ICD-10-CM | POA: Insufficient documentation

## 2016-04-09 DIAGNOSIS — E875 Hyperkalemia: Secondary | ICD-10-CM | POA: Diagnosis not present

## 2016-04-09 DIAGNOSIS — S069X2S Unspecified intracranial injury with loss of consciousness of 31 minutes to 59 minutes, sequela: Secondary | ICD-10-CM

## 2016-04-09 DIAGNOSIS — I2510 Atherosclerotic heart disease of native coronary artery without angina pectoris: Secondary | ICD-10-CM | POA: Diagnosis present

## 2016-04-09 DIAGNOSIS — I110 Hypertensive heart disease with heart failure: Secondary | ICD-10-CM | POA: Diagnosis present

## 2016-04-09 DIAGNOSIS — I10 Essential (primary) hypertension: Secondary | ICD-10-CM | POA: Diagnosis not present

## 2016-04-09 DIAGNOSIS — D62 Acute posthemorrhagic anemia: Secondary | ICD-10-CM | POA: Diagnosis present

## 2016-04-09 DIAGNOSIS — S62317A Displaced fracture of base of fifth metacarpal bone. left hand, initial encounter for closed fracture: Secondary | ICD-10-CM | POA: Diagnosis not present

## 2016-04-09 DIAGNOSIS — Z87891 Personal history of nicotine dependence: Secondary | ICD-10-CM

## 2016-04-09 DIAGNOSIS — I5023 Acute on chronic systolic (congestive) heart failure: Secondary | ICD-10-CM | POA: Diagnosis not present

## 2016-04-09 DIAGNOSIS — E785 Hyperlipidemia, unspecified: Secondary | ICD-10-CM | POA: Diagnosis present

## 2016-04-09 DIAGNOSIS — S225XXD Flail chest, subsequent encounter for fracture with routine healing: Secondary | ICD-10-CM | POA: Diagnosis not present

## 2016-04-09 DIAGNOSIS — E876 Hypokalemia: Secondary | ICD-10-CM | POA: Diagnosis present

## 2016-04-09 DIAGNOSIS — R0902 Hypoxemia: Secondary | ICD-10-CM | POA: Diagnosis present

## 2016-04-09 DIAGNOSIS — T84210A Breakdown (mechanical) of internal fixation device of bones of hand and fingers, initial encounter: Secondary | ICD-10-CM | POA: Diagnosis not present

## 2016-04-09 DIAGNOSIS — S61402D Unspecified open wound of left hand, subsequent encounter: Secondary | ICD-10-CM | POA: Diagnosis not present

## 2016-04-09 DIAGNOSIS — K5901 Slow transit constipation: Secondary | ICD-10-CM | POA: Insufficient documentation

## 2016-04-09 DIAGNOSIS — S62301G Unspecified fracture of second metacarpal bone, left hand, subsequent encounter for fracture with delayed healing: Secondary | ICD-10-CM | POA: Diagnosis not present

## 2016-04-09 DIAGNOSIS — E871 Hypo-osmolality and hyponatremia: Secondary | ICD-10-CM | POA: Diagnosis not present

## 2016-04-09 DIAGNOSIS — S065X9D Traumatic subdural hemorrhage with loss of consciousness of unspecified duration, subsequent encounter: Secondary | ICD-10-CM | POA: Diagnosis not present

## 2016-04-09 DIAGNOSIS — Z8781 Personal history of (healed) traumatic fracture: Secondary | ICD-10-CM

## 2016-04-09 DIAGNOSIS — Z95810 Presence of automatic (implantable) cardiac defibrillator: Secondary | ICD-10-CM

## 2016-04-09 DIAGNOSIS — S62325D Displaced fracture of shaft of fourth metacarpal bone, left hand, subsequent encounter for fracture with routine healing: Secondary | ICD-10-CM | POA: Diagnosis not present

## 2016-04-09 DIAGNOSIS — S62305A Unspecified fracture of fourth metacarpal bone, left hand, initial encounter for closed fracture: Secondary | ICD-10-CM | POA: Diagnosis not present

## 2016-04-09 DIAGNOSIS — S62307A Unspecified fracture of fifth metacarpal bone, left hand, initial encounter for closed fracture: Secondary | ICD-10-CM | POA: Diagnosis not present

## 2016-04-09 LAB — COMPREHENSIVE METABOLIC PANEL
ALBUMIN: 2.9 g/dL — AB (ref 3.5–5.0)
ALT: 13 U/L — ABNORMAL LOW (ref 17–63)
AST: 36 U/L (ref 15–41)
Alkaline Phosphatase: 77 U/L (ref 38–126)
Anion gap: 7 (ref 5–15)
BUN: 17 mg/dL (ref 6–20)
CHLORIDE: 96 mmol/L — AB (ref 101–111)
CO2: 23 mmol/L (ref 22–32)
Calcium: 9.2 mg/dL (ref 8.9–10.3)
Creatinine, Ser: 1.14 mg/dL (ref 0.61–1.24)
GFR calc Af Amer: >60 mL/min (ref >60)
GFR calc non Af Amer: 58 mL/min — ABNORMAL LOW (ref >60)
GLUCOSE: 99 mg/dL (ref 65–99)
POTASSIUM: 5.8 mmol/L — AB (ref 3.5–5.1)
SODIUM: 126 mmol/L — AB (ref 135–145)
Total Bilirubin: 0.9 mg/dL (ref 0.3–1.2)
Total Protein: 6.1 g/dL — ABNORMAL LOW (ref 6.5–8.1)

## 2016-04-09 LAB — CBC WITH DIFFERENTIAL/PLATELET
BASOS ABS: 0 10*3/uL (ref 0.0–0.1)
BASOS PCT: 0 %
EOS PCT: 4 %
Eosinophils Absolute: 0.6 10*3/uL (ref 0.0–0.7)
HCT: 33.9 % — ABNORMAL LOW (ref 39.0–52.0)
Hemoglobin: 11.6 g/dL — ABNORMAL LOW (ref 13.0–17.0)
Lymphocytes Relative: 7 %
Lymphs Abs: 1 10*3/uL (ref 0.7–4.0)
MCH: 33.3 pg (ref 26.0–34.0)
MCHC: 34.2 g/dL (ref 30.0–36.0)
MCV: 97.4 fL (ref 78.0–100.0)
MONO ABS: 1 10*3/uL (ref 0.1–1.0)
Monocytes Relative: 8 %
NEUTROS ABS: 10.9 10*3/uL — AB (ref 1.7–7.7)
Neutrophils Relative %: 81 %
PLATELETS: 219 10*3/uL (ref 150–400)
RBC: 3.48 MIL/uL — ABNORMAL LOW (ref 4.22–5.81)
RDW: 13.9 % (ref 11.5–15.5)
WBC: 13.5 10*3/uL — ABNORMAL HIGH (ref 4.0–10.5)

## 2016-04-09 LAB — MAGNESIUM: Magnesium: 1.7 mg/dL (ref 1.7–2.4)

## 2016-04-09 MED ORDER — CLOPIDOGREL BISULFATE 75 MG PO TABS
75.0000 mg | ORAL_TABLET | Freq: Every day | ORAL | Status: DC
  Administered 2016-04-10 – 2016-04-27 (×18): 75 mg via ORAL
  Filled 2016-04-09 (×19): qty 1

## 2016-04-09 MED ORDER — DIPHENHYDRAMINE HCL 12.5 MG/5ML PO ELIX
12.5000 mg | ORAL_SOLUTION | Freq: Four times a day (QID) | ORAL | Status: DC | PRN
  Filled 2016-04-09: qty 10

## 2016-04-09 MED ORDER — SENNOSIDES-DOCUSATE SODIUM 8.6-50 MG PO TABS
1.0000 | ORAL_TABLET | Freq: Two times a day (BID) | ORAL | Status: DC
  Administered 2016-04-09 – 2016-04-27 (×24): 1 via ORAL
  Filled 2016-04-09 (×33): qty 1

## 2016-04-09 MED ORDER — GUAIFENESIN ER 600 MG PO TB12: 1200.0000 mg | ORAL_TABLET | Freq: Two times a day (BID) | ORAL | Status: DC | PRN

## 2016-04-09 MED ORDER — PROCHLORPERAZINE MALEATE 5 MG PO TABS: 5.0000 mg | ORAL_TABLET | Freq: Four times a day (QID) | ORAL | Status: DC | PRN

## 2016-04-09 MED ORDER — BISACODYL 10 MG RE SUPP: 10.0000 mg | Freq: Every day | RECTAL | Status: DC | PRN

## 2016-04-09 MED ORDER — ATORVASTATIN CALCIUM 20 MG PO TABS
20.0000 mg | ORAL_TABLET | Freq: Every day | ORAL | Status: DC
  Administered 2016-04-09 – 2016-04-26 (×18): 20 mg via ORAL
  Filled 2016-04-09 (×18): qty 1

## 2016-04-09 MED ORDER — AMIODARONE HCL 200 MG PO TABS: 200.0000 mg | ORAL_TABLET | Freq: Every day | ORAL | Status: DC

## 2016-04-09 MED ORDER — FLEET ENEMA 7-19 GM/118ML RE ENEM: 1.0000 | ENEMA | Freq: Once | RECTAL | Status: DC | PRN

## 2016-04-09 MED ORDER — HYDROCODONE-ACETAMINOPHEN 5-325 MG PO TABS
1.0000 | ORAL_TABLET | ORAL | Status: DC | PRN
  Administered 2016-04-24 (×2): 1 via ORAL
  Filled 2016-04-09 (×2): qty 1

## 2016-04-09 MED ORDER — TRAZODONE HCL 50 MG PO TABS
25.0000 mg | ORAL_TABLET | Freq: Every evening | ORAL | Status: DC | PRN
  Administered 2016-04-09 – 2016-04-26 (×8): 50 mg via ORAL
  Filled 2016-04-09 (×8): qty 1

## 2016-04-09 MED ORDER — POLYETHYLENE GLYCOL 3350 17 G PO PACK
17.0000 g | PACK | Freq: Two times a day (BID) | ORAL | Status: DC
  Administered 2016-04-09 – 2016-04-26 (×22): 17 g via ORAL
  Filled 2016-04-09 (×32): qty 1

## 2016-04-09 MED ORDER — LEVOTHYROXINE SODIUM 25 MCG PO TABS
25.0000 ug | ORAL_TABLET | Freq: Every day | ORAL | Status: DC
  Administered 2016-04-10 – 2016-04-27 (×18): 25 ug via ORAL
  Filled 2016-04-09 (×18): qty 1

## 2016-04-09 MED ORDER — GUAIFENESIN-DM 100-10 MG/5ML PO SYRP: 5.0000 mL | ORAL_SOLUTION | Freq: Four times a day (QID) | ORAL | Status: DC | PRN

## 2016-04-09 MED ORDER — ALBUTEROL SULFATE (2.5 MG/3ML) 0.083% IN NEBU: 2.5000 mg | INHALATION_SOLUTION | RESPIRATORY_TRACT | Status: DC | PRN

## 2016-04-09 MED ORDER — VITAMIN B-1 100 MG PO TABS
100.0000 mg | ORAL_TABLET | Freq: Every day | ORAL | Status: DC
  Administered 2016-04-10 – 2016-04-27 (×18): 100 mg via ORAL
  Filled 2016-04-09 (×19): qty 1

## 2016-04-09 MED ORDER — ENSURE ENLIVE PO LIQD
237.0000 mL | Freq: Three times a day (TID) | ORAL | Status: DC
  Administered 2016-04-10 – 2016-04-11 (×3): 237 mL via ORAL

## 2016-04-09 MED ORDER — SALINE SPRAY 0.65 % NA SOLN
1.0000 | NASAL | Status: DC | PRN
  Filled 2016-04-09: qty 44

## 2016-04-09 MED ORDER — LISINOPRIL 10 MG PO TABS
10.0000 mg | ORAL_TABLET | Freq: Every day | ORAL | Status: DC
Start: 2016-04-09 — End: 2016-04-13
  Administered 2016-04-10 – 2016-04-13 (×3): 10 mg via ORAL
  Filled 2016-04-09 (×4): qty 1

## 2016-04-09 MED ORDER — PROCHLORPERAZINE EDISYLATE 5 MG/ML IJ SOLN: 5.0000 mg | Freq: Four times a day (QID) | INTRAMUSCULAR | Status: DC | PRN

## 2016-04-09 MED ORDER — ACETAMINOPHEN 325 MG PO TABS: 325.0000 mg | ORAL_TABLET | ORAL | Status: DC | PRN

## 2016-04-09 MED ORDER — ALLOPURINOL 100 MG PO TABS
100.0000 mg | ORAL_TABLET | Freq: Every day | ORAL | Status: DC
  Administered 2016-04-10 – 2016-04-26 (×17): 100 mg via ORAL
  Filled 2016-04-09 (×17): qty 1

## 2016-04-09 MED ORDER — SULFAMETHOXAZOLE-TRIMETHOPRIM 800-160 MG PO TABS
1.0000 | ORAL_TABLET | Freq: Two times a day (BID) | ORAL | Status: DC
  Administered 2016-04-09 – 2016-04-10 (×2): 1 via ORAL
  Filled 2016-04-09 (×2): qty 1

## 2016-04-09 MED ORDER — FUROSEMIDE 20 MG PO TABS: 20.0000 mg | ORAL_TABLET | Freq: Every day | ORAL | Status: DC

## 2016-04-09 MED ORDER — ALUM & MAG HYDROXIDE-SIMETH 200-200-20 MG/5ML PO SUSP: 30.0000 mL | ORAL | Status: DC | PRN

## 2016-04-09 MED ORDER — FUROSEMIDE 20 MG PO TABS
20.0000 mg | ORAL_TABLET | Freq: Every day | ORAL | Status: DC
  Administered 2016-04-10: 20 mg via ORAL
  Filled 2016-04-09: qty 1

## 2016-04-09 MED ORDER — PROCHLORPERAZINE 25 MG RE SUPP: 12.5000 mg | Freq: Four times a day (QID) | RECTAL | Status: DC | PRN

## 2016-04-09 MED ORDER — AMIODARONE HCL 200 MG PO TABS
200.0000 mg | ORAL_TABLET | Freq: Every day | ORAL | Status: DC
  Administered 2016-04-10 – 2016-04-27 (×18): 200 mg via ORAL
  Filled 2016-04-09 (×19): qty 1

## 2016-04-09 MED ORDER — CARVEDILOL 6.25 MG PO TABS
6.2500 mg | ORAL_TABLET | Freq: Two times a day (BID) | ORAL | Status: DC
  Administered 2016-04-09 – 2016-04-13 (×7): 6.25 mg via ORAL
  Filled 2016-04-09 (×8): qty 1

## 2016-04-09 NOTE — Clinical Social Work Note (Signed)
Patient will be discharging to CIR today.   CSW signing off.  Dayton Scrape, [place]

## 2016-04-09 NOTE — Progress Notes (Signed)
Pt transferred to 4W17. Report called to [facility], Therapist, sports. Pt transported by bed with this RN and NT. VSS. eICU and CCMD notified of transfer. Family notified of transfer. All belongings sent with patient including nasal spray, phone charger, and glasses.

## 2016-04-09 NOTE — Progress Notes (Signed)
Rehab admissions - I met with patient today.  He was up in chair for breakfast.  I have a bed available on rehab today.  I can admit to inpatient rehab today if okay with attending MD.  I did receive a call from Dr. Sherral Hammers on Saturday saying that patient was medically ready for rehab.  I will verify with Dr. Thereasa Solo today.  Call me for questions.  #[phone]
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
## 2016-04-09 NOTE — Care Management Important Message (Signed)
Important Message  Patient Details  Name: [name] MRN: [id number] Date of Birth: [birth year]   Medicare Important Message Given:  Yes    Nathen May 04/09/2016, 11:28 AM
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
## 2016-04-10 ENCOUNTER — Inpatient Hospital Stay (HOSPITAL_COMMUNITY): Payer: Medicare Other | Admitting: Physical Therapy

## 2016-04-10 ENCOUNTER — Inpatient Hospital Stay (HOSPITAL_COMMUNITY): Payer: Medicare Other | Admitting: Speech Pathology

## 2016-04-10 ENCOUNTER — Inpatient Hospital Stay (HOSPITAL_COMMUNITY): Payer: Medicare Other | Admitting: Occupational Therapy

## 2016-04-10 DIAGNOSIS — G931 Anoxic brain damage, not elsewhere classified: Secondary | ICD-10-CM

## 2016-04-10 DIAGNOSIS — S069X1S Unspecified intracranial injury with loss of consciousness of 30 minutes or less, sequela: Secondary | ICD-10-CM

## 2016-04-10 MED ORDER — SODIUM POLYSTYRENE SULFONATE 15 GM/60ML PO SUSP
15.0000 g | Freq: Once | ORAL | Status: AC
  Administered 2016-04-10: 15 g via ORAL
  Filled 2016-04-10: qty 60

## 2016-04-10 NOTE — Progress Notes (Signed)
Patient information reviewed and entered into eRehab system by Pacey Altizer, RN, CRRN, PPS Coordinator.  Information including medical coding and functional independence measure will be reviewed and updated through discharge.     Per nursing patient was given "Data Collection Information Summary for Patients in Inpatient Rehabilitation Facilities with attached "Privacy Act Statement-Health Care Records" upon admission.  
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
## 2016-04-10 NOTE — Progress Notes (Signed)
Speech Language Pathology Daily Session Note  Patient Details  Name: GRASYN COSSE MRN: [id number] Date of Birth: 11-Apr-1931  Today's Date: 04/10/2016 SLP Individual Time: 1500-1530 SLP Individual Time Calculation (min): 30 min  Short Term Goals: Week 1: SLP Short Term Goal 1 (Week 1): STG=LTG due to length of stay  Skilled Therapeutic Interventions: Skilled treatment session focused on cognitive goals. SLP facilitated session by creating an external memory aid to maximize recall of his current medications. Patient independently recalled 50% of his medications and their functions and required total A to recall the remainder of his medications, suspect due to the medications being new this admission. Patient also participated in a functional discussion in regards to the reasoning behind utilizing a 2 time per day pill box and was in agreement. Will organize pill box at next session. Patient left supine in bed with all needs within reach. Continue with current plan of care.    Function:  Cognition Comprehension Comprehension assist level: Understands complex 90% of the time/cues 10% of the time  Expression   Expression assist level: Expresses complex ideas: With extra time/assistive device  Social Interaction Social Interaction assist level: Interacts appropriately with others with medication or extra time (anti-anxiety, antidepressant).  Problem Solving Problem solving assist level: Solves basic 90% of the time/requires cueing < 10% of the time  Memory Memory assist level: Recognizes or recalls 50 - 74% of the time/requires cueing 25 - 49% of the time    Pain Pain Assessment Pain Assessment: No/denies pain Pain Score: 0-No pain  Therapy/Group: Individual Therapy  Sonjia Wilcoxson 04/10/2016, 4:24 PM

## 2016-04-10 NOTE — Progress Notes (Signed)
Potassium this morning on routine labs 5.8 and not hemolyzed. We will give low dose of Kayexalate follow-up chemistries in a.m. May need to hold Macrodantin for now as this can cause some hyperkalemia and follow-up labs in a.m.
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
## 2016-04-10 NOTE — Care Management Note (Signed)
[facility] Individual Statement of Services  Patient Name:  [name]  Date:  04/10/2016  Welcome to the [facility].  Our goal is to provide you with an individualized program based on your diagnosis and situation, designed to meet your specific needs.  With this comprehensive rehabilitation program, you will be expected to participate in at least 3 hours of rehabilitation therapies Monday-Friday, with modified therapy programming on the weekends.  Your rehabilitation program will include the following services:  Physical Therapy (PT), Occupational Therapy (OT), Speech Therapy (ST), 24 hour per day rehabilitation nursing, Therapeutic Recreaction (TR), Neuropsychology, Case Management (Social Worker), Rehabilitation Medicine, Nutrition Services and Pharmacy Services  Weekly team conferences will be held on Tuesdays to discuss your progress.  Your Social Worker will talk with you frequently to get your input and to update you on team discussions.  Team conferences with you and your family in attendance may also be held.  Expected length of stay: 2-2 1/2 weeks  Overall anticipated outcome: supervision  Depending on your progress and recovery, your program may change. Your Social Worker will coordinate services and will keep you informed of any changes. Your Social Worker's name and contact numbers are listed  below.  The following services may also be recommended but are not provided by the [facility] will be made to provide these services after discharge if needed.  Arrangements include referral to agencies that provide these services.  Your insurance has been verified to be:  Medicare and [employer] Your primary doctor is:  Dr. Lenna Gilford  Pertinent information will be shared with your doctor and your insurance  company.  Social Worker:  [place], [place] or ([phone]   Information discussed with and copy given to patient by: Lennart Pall, 04/10/2016, 3:38 PM
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
## 2016-04-11 ENCOUNTER — Inpatient Hospital Stay (HOSPITAL_COMMUNITY): Payer: Medicare Other | Admitting: Physical Therapy

## 2016-04-11 ENCOUNTER — Inpatient Hospital Stay (HOSPITAL_COMMUNITY): Payer: Medicare Other | Admitting: Speech Pathology

## 2016-04-11 ENCOUNTER — Inpatient Hospital Stay (HOSPITAL_COMMUNITY): Payer: Self-pay

## 2016-04-11 DIAGNOSIS — E876 Hypokalemia: Secondary | ICD-10-CM

## 2016-04-11 DIAGNOSIS — I5032 Chronic diastolic (congestive) heart failure: Secondary | ICD-10-CM

## 2016-04-11 DIAGNOSIS — E871 Hypo-osmolality and hyponatremia: Secondary | ICD-10-CM

## 2016-04-11 LAB — SODIUM, URINE, RANDOM: SODIUM UR: 46 mmol/L

## 2016-04-11 LAB — BASIC METABOLIC PANEL
ANION GAP: 8 (ref 5–15)
BUN: 19 mg/dL (ref 6–20)
CALCIUM: 8.9 mg/dL (ref 8.9–10.3)
CO2: 24 mmol/L (ref 22–32)
Chloride: 94 mmol/L — ABNORMAL LOW (ref 101–111)
Creatinine, Ser: 1.26 mg/dL — ABNORMAL HIGH (ref 0.61–1.24)
GFR, EST AFRICAN AMERICAN: 59 mL/min — AB (ref >60)
GFR, EST NON AFRICAN AMERICAN: 51 mL/min — AB (ref >60)
Glucose, Bld: 138 mg/dL — ABNORMAL HIGH (ref 65–99)
POTASSIUM: 4.7 mmol/L (ref 3.5–5.1)
SODIUM: 126 mmol/L — AB (ref 135–145)

## 2016-04-11 MED ORDER — ENSURE ENLIVE PO LIQD
237.0000 mL | Freq: Two times a day (BID) | ORAL | Status: DC
  Administered 2016-04-12 – 2016-04-27 (×25): 237 mL via ORAL

## 2016-04-11 NOTE — Progress Notes (Signed)
Orthopedic Tech Progress Note Patient Details:  [name] 1931-09-29 [id number]  Ortho Devices Type of Ortho Device: Ace wrap, Volar splint Ortho Device/Splint Location: lue Ortho Device/Splint Interventions: Application   [name] 04/11/2016, 9:16 AM

## 2016-04-11 NOTE — Progress Notes (Signed)
Physical Therapy Session Note  Patient Details  Name: KHASIR PEHL MRN: [id number] Date of Birth: 1931-07-09  Today's Date: 04/11/2016 PT Individual Time: 1307-1340 PT Individual Time Calculation (min): 33 min   Short Term Goals: Week 1:  PT Short Term Goal 1 (Week 1): Patient will perform bed <> wheelchair transfers with consistent mod A.  PT Short Term Goal 2 (Week 1): Patient will ambulate 15 ft using LRAD with max A.  PT Short Term Goal 3 (Week 1): Patient will maintain dynamic standing balance x 1 min during functional task with mod A.  PT Short Term Goal 4 (Week 1): Paitent will initiate stair training.   Skilled Therapeutic Interventions/Progress Updates:    Patient received supine in bed and agreeable to PT.   PT assessed patients BP for orthostasis:  Supine: 100/55 Asymptomatic.  Sitting at 0 min: 118/93 Symptomatic.  Sitting 2 minutes: 93/49 symptomatic.  Sitting 5 min: 84/49 increased symptomatic.  For supine<>sit PT provided min A for patient at trunk for improved stability trunk rotation. PT provided mod multimodal cues to prevent WB through L hand for supine to sitting EOB and to return to supine positoin. Patient reports no symptoms of orthostasis in supine with BP 105/82   PT instructed patient in Supine LE therex for BLE x 10 each exercise including: SLR with noted extensor lag, Heel slides, Clam shells with level 2 tband, hip abduction, and ankle PF. Throughout all therex PT provided min cues for improved technique including decreased speed of eccentric movement to improved strengthening aspects of exercise.   Patient left supine in bed with call bell within reach.   Therapy Documentation Precautions:  Precautions Precautions: Fall, Other (comment) Precaution Comments: monitor vitals Required Braces or Orthoses: Other Brace/Splint Other Brace/Splint: L short arm splint Restrictions Weight Bearing Restrictions: Yes LUE Weight Bearing: Partial weight bearing LUE  Partial Weight Bearing Percentage or Pounds: platform WB through forearm ok  Vital Signs: Therapy Vitals BP: (!) 82/54 mmHg Patient Position (if appropriate): Standing   See Function Navigator for Current Functional Status.   Therapy/Group: Individual Therapy  [name] 04/11/2016, 6:06 PM
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
## 2016-04-11 NOTE — Progress Notes (Signed)
Social Work Patient ID: [name], male   DOB: [birth year], 82 y.o.   MRN: [id number]   Met with pt and daughter following team conference.  Both aware and agreeable with targeted d/c date of 7/7 and supervision goals.  No concerns at this time.  Nuvia Hileman, LCSW

## 2016-04-11 NOTE — Progress Notes (Signed)
Orthopedic Tech Progress Note Patient Details:  [name] 29-May-1931 [id number] Patient already has dorsal splint on. Patient ID: [name], male   DOB: 16-Dec-1930, 82 y.o.   MRN: [id number]   [name] 04/11/2016, 10:10 PM

## 2016-04-11 NOTE — Progress Notes (Signed)
Speech Language Pathology Daily Session Note  Patient Details  Name: [name] MRN: [id number] Date of Birth: Dec 15, 1930  Today's Date: 04/11/2016  Session 1 SLP Individual Time: 0820-0900 SLP Individual Time Calculation (min): 40 min Session 2 SLP Individual Time: 1130-1155 SLP Individual Time Calculation (min): 25 min  Short Term Goals: Week 1: SLP Short Term Goal 1 (Week 1): STG=LTG due to length of stay  Skilled Therapeutic Interventions: Session 1 Skilled treatment session focused on addressing cognition goals. SLP facilitated session by providing an external aid to assist with recall of current medications. Patient required Min verbal cues to utilize to recall new medications' functions and frequencies.  Patient required set-up assist and Supervision level verbal cues to self-monitor and correct error x1 while loading a medication box.  Continue with current plan of care.   Session 2 Skilled treatment session focused on addressing cognition education.  Patient and daughter presents for session.  SLP utilized teachback and a written handout with daughter verbalizing understanding of information; patient requires repetition due to decreased insight/anticipatory awareness into impacts of current deficits.  Continue with current plan of care.   Function:  Cognition Comprehension Comprehension assist level: Follows basic conversation/direction with extra time/assistive device  Expression   Expression assist level: Expresses basic needs/ideas: With no assist  Social Interaction Social Interaction assist level: Interacts appropriately 90% of the time - Needs monitoring or encouragement for participation or interaction.  Problem Solving Problem solving assist level: Solves basic 75 - 89% of the time/requires cueing 10 - 24% of the time  Memory Memory assist level: Recognizes or recalls 75 - 89% of the time/requires cueing 10 - 24% of the time    Pain Pain Assessment Pain  Assessment: No/denies pain x2  Therapy/Group: Individual Therapy x2  Carmelia Roller., [place] [id number]  [place] 04/11/2016, 12:29 PM

## 2016-04-11 NOTE — Progress Notes (Signed)
Occupational Therapy Session Note  Patient Details  Name: [name] MRN: [id number] Date of Birth: [birth year]  Today's Date: 04/11/2016 OT Individual Time: 1000-1045 OT Individual Time Calculation (min): 45 min    Short Term Goals: Week 1:  OT Short Term Goal 1 (Week 1): Pt will complete UB dressing from w/c with less than 2 VC in regards to hem dressing technique OT Short Term Goal 2 (Week 1): Pt will complete toilet transfer completing stand pivot with  min A  OT Short Term Goal 3 (Week 1): Pt will complete grooming from w/c with less than 2 VC  OT Short Term Goal 4 (Week 1): Pt will donn pants with mod A  OT Short Term Goal 5 (Week 1): Pt will complete 5 minutes of a functional activity with 1 rest break from seated level to increase functional avitivity tolerance   Skilled Therapeutic Interventions/Progress Updates:    Pt resting in bed upon arrival with daughter present.  Pt stated he was ready to get washed up and change clothing.  Pt sat EOB at supervision level and c/o dizziness. BP 96/51. After approx 4 mins pt stated that his dizziness wasn't any better but he wanted to start washing up and change clothing.  Pt completed UB bathing and dressing while seated EOB.  BP 95/56. Pt stated that he couldn't keep going because of his dizziness and requested to lay back in bed.  Pt performed sit>supine at supervision level and was able to scoot up in bed without assistance. BP in supine at 118/53. Pt was SOB during all tasks while seated.  Pt stated he was not able to continue.  Pt missed 45 mins skilled OT services secondary to increased dizziness with activity and fatigue.  Therapy Documentation Precautions:  Precautions Precautions: Fall, Other (comment) Precaution Comments: monitor vitals Required Braces or Orthoses: Other Brace/Splint Other Brace/Splint: L short arm splint Restrictions Weight Bearing Restrictions: Yes LUE Weight Bearing: Partial weight bearing LUE Partial Weight  Bearing Percentage or Pounds: platform WB through forearm ok General: General OT Amount of Missed Time: 45 Minutes   Pain:  Pt denied pain  See Function Navigator for Current Functional Status.   Therapy/Group: Individual Therapy  [name] 04/11/2016, 12:06 PM
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
## 2016-04-11 NOTE — Progress Notes (Signed)
Physical Therapy Session Note  Patient Details  Name: [name] MRN: [id number] Date of Birth: Jul 21, 1931  Today's Date: 04/11/2016 PT Individual Time: [id number] PT Individual Time Calculation (min): 60 min   Short Term Goals: Week 1:  PT Short Term Goal 1 (Week 1): Patient will perform bed <> wheelchair transfers with consistent mod A.  PT Short Term Goal 2 (Week 1): Patient will ambulate 15 ft using LRAD with max A.  PT Short Term Goal 3 (Week 1): Patient will maintain dynamic standing balance x 1 min during functional task with mod A.  PT Short Term Goal 4 (Week 1): Paitent will initiate stair training.   Skilled Therapeutic Interventions/Progress Updates:   Patient semi reclined in bed, daughter [name] present for session. Supine BP = 129/56, patient agreeable to sit EOB and transferred supine > sit with supervision. Instructed in seated therex for BLE strengthening/ROM: marching x 20, LAQ x 10 each LE, heel raises x 20, hip adduction pillow squeezes x 20, manually resisted hip abduction x 20. Performed sit <> stand x 3 from edge of bed using platform RW with verbal cues for safe hand placement with max A overall. Static standing using platform RW with initial mod A due to posterior lean progressed to close supervision x 3 trials. Vitals monitored throughout session, see flow sheet. Patient fatigued easily with increased SOB with functional mobility requiring prolonged seated rest breaks between tasks to recover. Patient transferred sit > supine with supervision and repositioned higher in bed. Discussed transferring to chair to eat dinner, patient verbalized agreement. Patient left semi reclined in bed with all needs in reach, daughter present.     Therapy Documentation Precautions:  Precautions Precautions: Fall, Other (comment) Precaution Comments: monitor vitals Required Braces or Orthoses: Other Brace/Splint Other Brace/Splint: L short arm splint Restrictions Weight Bearing  Restrictions: Yes LUE Weight Bearing: Partial weight bearing LUE Partial Weight Bearing Percentage or Pounds: platform WB through forearm ok Vital Signs: Therapy Vitals Temp: 97.9 F (36.6 C) Temp Source: Oral Pulse Rate: (!) 58 Resp: 18 BP: (!) 82/54 mmHg Patient Position (if appropriate): Standing Oxygen Therapy SpO2: 98 % O2 Device: Not Delivered Pain: Pain Assessment Pain Assessment: 0-10 Pain Score: 3  Pain Type: Acute pain Pain Location: Rib cage Pain Orientation: Right Pain Descriptors / Indicators: Aching Pain Onset: On-going Pain Intervention(s): Repositioned   See Function Navigator for Current Functional Status.   Therapy/Group: Individual Therapy  Laretta Alstrom 04/11/2016, 4:23 PM
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
## 2016-04-12 ENCOUNTER — Inpatient Hospital Stay (HOSPITAL_COMMUNITY): Payer: Medicare Other

## 2016-04-12 ENCOUNTER — Inpatient Hospital Stay (HOSPITAL_COMMUNITY): Payer: Medicare Other | Admitting: Physical Therapy

## 2016-04-12 DIAGNOSIS — S069X2S Unspecified intracranial injury with loss of consciousness of 31 minutes to 59 minutes, sequela: Secondary | ICD-10-CM

## 2016-04-12 DIAGNOSIS — I5023 Acute on chronic systolic (congestive) heart failure: Secondary | ICD-10-CM

## 2016-04-12 DIAGNOSIS — I951 Orthostatic hypotension: Secondary | ICD-10-CM | POA: Diagnosis present

## 2016-04-12 LAB — BASIC METABOLIC PANEL
Anion gap: 7 (ref 5–15)
BUN: 18 mg/dL (ref 6–20)
CHLORIDE: 94 mmol/L — AB (ref 101–111)
CO2: 25 mmol/L (ref 22–32)
CREATININE: 1.31 mg/dL — AB (ref 0.61–1.24)
Calcium: 8.9 mg/dL (ref 8.9–10.3)
GFR calc Af Amer: 56 mL/min — ABNORMAL LOW (ref >60)
GFR calc non Af Amer: 49 mL/min — ABNORMAL LOW (ref >60)
Glucose, Bld: 97 mg/dL (ref 65–99)
Potassium: 5 mmol/L (ref 3.5–5.1)
SODIUM: 126 mmol/L — AB (ref 135–145)

## 2016-04-12 NOTE — Progress Notes (Signed)
Physical Therapy Session Note  Patient Details  Name: [name] MRN: [id number] Date of Birth: 1930/12/08  Today's Date: 04/12/2016 PT Individual Time: 2924-4628 PT Individual Time Calculation (min): 45 min   Short Term Goals: Week 1:  PT Short Term Goal 1 (Week 1): Patient will perform bed <> wheelchair transfers with consistent mod A.  PT Short Term Goal 2 (Week 1): Patient will ambulate 15 ft using LRAD with max A.  PT Short Term Goal 3 (Week 1): Patient will maintain dynamic standing balance x 1 min during functional task with mod A.  PT Short Term Goal 4 (Week 1): Paitent will initiate stair training.   Skilled Therapeutic Interventions/Progress Updates:    Pt presents in bed alert and agreeable to therapy. Pt stating BP was low earlier, checked in supine 113/56, pt performed supine to sit at EOB with use of bed rails with CGA.  In sitting pt stating initially no increase in dizziness however began to feel dizziness after approx 2 min.  BP checked 117/61, pt performed sit to stand transfer with use of platform walker and modA for sequencing and safety.  Pt demonstrated initial moderate posterior lean which was corrected with PTA providing tactile cues. Pt performed w/c to mat transfers with mod cues for managing PW and sequencing x 4 with improving technique.  Pt performed standing static activities reaching with RUE demonstrating F- balance and decreasing posterior lean with tactile cues.  Standing tolerance first trial 1:45, second trial 1:30.  Pt transferred back to w/c with PW with improved carryover.  Pt returned to room with daughter and all current needs met.   Therapy Documentation Precautions:  Precautions Precautions: Fall, Other (comment) Precaution Comments: monitor vitals Required Braces or Orthoses: Other Brace/Splint Other Brace/Splint: L short arm splint Restrictions Weight Bearing Restrictions: Yes LUE Weight Bearing: Partial weight bearing LUE Partial Weight  Bearing Percentage or Pounds: platform WB through forearm ok General:    See Function Navigator for Current Functional Status.   Therapy/Group: Individual Therapy  Johnmichael Melhorn  Chrisy Hillebrand, PTA  04/12/2016, 4:01 PM

## 2016-04-12 NOTE — Progress Notes (Signed)
Pt's daughter in room concerned of pts' decrease in BP and not being able to participate in Rehab yesterday, this RN updated daughter Lisinopril was held this am, updated charge RN and plan to update pm receiving RN tonight

## 2016-04-12 NOTE — IPOC Note (Signed)
Overall Plan of Care [facility]) Patient Details Name: [name] MRN: [id number] DOB: [birth year]  Admitting Diagnosis: triad tbi polytrauma  Hospital Problems: Principal Problem:   TBI (traumatic brain injury) ([place]) Active Problems:   Essential hypertension   Acute on chronic combined systolic and diastolic congestive heart failure (HCC)   Acute blood loss anemia   Orthostatic hypotension   Hyponatremia     Functional Problem List: Nursing Bowel, Medication Management, Nutrition, Pain, Safety, Skin Integrity  PT Balance, Edema, Endurance, Motor, Nutrition, Pain, Safety  OT Balance, Cognition, Endurance, Motor, Pain, Safety  SLP Cognition  TR         Basic ADL's: OT Grooming, Bathing, Dressing, Toileting, Eating     Advanced  ADL's: OT       Transfers: PT Bed Mobility, Bed to Chair, Car, Manufacturing systems engineer, Metallurgist: PT Ambulation, Emergency planning/management officer, Stairs     Additional Impairments: OT None  SLP Social Cognition   Problem Solving, Memory, Awareness  TR      Anticipated Outcomes Item Anticipated Outcome  Self Feeding Set-up  Swallowing      Basic self-care  Supervision- min A  Toileting  Supervision   Bathroom Transfers Supervision  Bowel/Bladder  Continent of bowel and bladder LBM 6/18 uses urinal with assistance  Transfers  supervision  Locomotion  supervision household ambulator  Communication     Cognition  Supervision with complex  Pain  4 of 10 pain score  Safety/Judgment  Supervision   Therapy Plan: PT Intensity: Minimum of 1-2 x/day ,45 to 90 minutes PT Frequency: 5 out of 7 days PT Duration Estimated Length of Stay: 14-17 days OT Intensity: Minimum of 1-2 x/day, 45 to 90 minutes OT Frequency: 5 out of 7 days OT Duration/Estimated Length of Stay: 2- 2.5 weeks SLP Intensity: Minumum of 1-2 x/day, 30 to 90 minutes SLP Frequency: 1 to 3 out of 7 days SLP Duration/Estimated Length of Stay: 10 days       Team  Interventions: Nursing Interventions Patient/Family Education, Bladder Management, Bowel Management, Disease Management/Prevention, Pain Management, Skin Care/Wound Management, Medication Management, Psychosocial Support  PT interventions Ambulation/gait training, Training and development officer, Cognitive remediation/compensation, Community reintegration, Discharge planning, Disease management/prevention, DME/adaptive equipment instruction, Functional mobility training, Neuromuscular re-education, Pain management, Patient/family education, Psychosocial support, Stair training, Therapeutic Activities, Therapeutic Exercise, UE/LE Strength taining/ROM, UE/LE Coordination activities, Wheelchair propulsion/positioning  OT Interventions Community reintegration, Training and development officer, Cognitive remediation/compensation, Discharge planning, DME/adaptive equipment instruction, Disease mangement/prevention, Functional mobility training, Pain management, Patient/family education, Psychosocial support, Self Care/advanced ADL retraining, Splinting/orthotics, Skin care/wound managment, Therapeutic Activities, UE/LE Strength taining/ROM, Therapeutic Exercise, UE/LE Coordination activities, Wheelchair propulsion/positioning  SLP Interventions Cognitive remediation/compensation, English as a second language teacher, Environmental controls, Functional tasks, Internal/external aids, Medication managment, Patient/family education, Therapeutic Activities  TR Interventions    SW/CM Interventions Discharge Planning, Psychosocial Support, Patient/Family Education    Team Discharge Planning: Destination: PT-Home ,OT- Home , SLP-Home Projected Follow-up: PT-24 hour supervision/assistance, Home health PT, OT-  Home health OT, SLP-Outpatient SLP, 24 hour supervision/assistance Projected Equipment Needs: PT-To be determined, OT- To be determined, SLP-None recommended by SLP Equipment Details: PT- , OT-  Patient/family involved in discharge  planning: PT- Patient, Family member/caregiver,  OT-Patient, SLP-Patient, Family member/caregiver  MD ELOS: 14-17 days Medical Rehab Prognosis:  Excellent Assessment: The patient has been admitted for CIR therapies with the diagnosis of TBI/anoxic injury/polytrauma. The team will be addressing functional mobility, strength, stamina, balance, safety, adaptive techniques and equipment, self-care, bowel and bladder mgt, patient and  caregiver education, NMR, cognitive perceptual rx, pain control, ortho precautions, ego support, and community reintegration. Goals have been set at supervision for mobility, supervision/min assisst with self-care and supervision with cognitive tasks.    [name], MD, FAAPMR      See Team Conference Notes for weekly updates to the plan of care

## 2016-04-12 NOTE — Progress Notes (Signed)
Occupational Therapy Session Note  Patient Details  Name: [name] MRN: [id number] Date of Birth: December 11, 1930  Today's Date: 04/12/2016 OT Individual Time: 1000-1100 OT Individual Time Calculation (min): 60 min    Short Term Goals: Week 1:  OT Short Term Goal 1 (Week 1): Pt will complete UB dressing from w/c with less than 2 VC in regards to hem dressing technique OT Short Term Goal 2 (Week 1): Pt will complete toilet transfer completing stand pivot with  min A  OT Short Term Goal 3 (Week 1): Pt will complete grooming from w/c with less than 2 VC  OT Short Term Goal 4 (Week 1): Pt will donn pants with mod A  OT Short Term Goal 5 (Week 1): Pt will complete 5 minutes of a functional activity with 1 rest break from seated level to increase functional avitivity tolerance   Skilled Therapeutic Interventions/Progress Updates:    Pt resting in bed upon arrival with daughter present.  Pt agreeable to bathing and dressing with sit<>stand from EOB.  Pt c/o some dizziness while sitting EOB and standing but was able to continue with tasks.  Pt becomes SOB with tasks and requires extended rest breaks between segments of bathing and dressing.  Pt stood X 4 during session.  Pt standing balance improved with each time standing and required steady A when standing to pull up pants.  Reviewed LTG and STG with patient.  Pt agreed that goals were reasonable and goals that he wanted to work on. Pt educated on compensatory strategies for bathing and dressing.  Focus on activity tolerance, dynamic sitting balance, standing balance, sit<>stand, BADL retraining, and safety awareness to increase independence with BADLs.   Therapy Documentation Precautions:  Precautions Precautions: Fall, Other (comment) Precaution Comments: monitor vitals Required Braces or Orthoses: Other Brace/Splint Other Brace/Splint: L short arm splint Restrictions Weight Bearing Restrictions: Yes LUE Weight Bearing: Partial weight  bearing LUE Partial Weight Bearing Percentage or Pounds: platform WB through forearm ok Pain: Pain Assessment Pain Assessment: No/denies pain  See Function Navigator for Current Functional Status.   Therapy/Group: Individual Therapy  [name] 04/12/2016, 11:03 AM

## 2016-04-12 NOTE — Progress Notes (Signed)
Physical Therapy Session Note  Patient Details  Name: [name] MRN: [id number] Date of Birth: Apr 06, 1931  Today's Date: 04/12/2016  PT Individual Time: 0800-0900 and 1520-1600 PT Individual Time Calculation (min): 60 min and 40 min  Short Term Goals: Week 1:  PT Short Term Goal 1 (Week 1): Patient will perform bed <> wheelchair transfers with consistent mod A.  PT Short Term Goal 2 (Week 1): Patient will ambulate 15 ft using LRAD with max A.  PT Short Term Goal 3 (Week 1): Patient will maintain dynamic standing balance x 1 min during functional task with mod A.  PT Short Term Goal 4 (Week 1): Paitent will initiate stair training.   Skilled Therapeutic Interventions/Progress Updates:   Treatment 1: Patient in bed upon arrival, donned TED hose and socks total A. Session focused on bed mobility retraining with HOB slightly raised and use of rail with supervision and verbal cues to move slowly with transitional movements due to dizziness, sitting tolerance EOB while consuming breakfast meal with seated BP after 10 min = 104/54 and pt asymptomatic, stand pivot transfers with max A to facilitate anterior weight shift due to posterior lean, gait training using PFRW x 6 ft with min-mod A and +2 for wheelchair follow for safety with BP after ambulation 90/43 and symptomatic, and stand pivot transfers to/from toilet using grab bar with max A. Patient required prolonged rest breaks with functional mobility due to SOB and fatigue. Patient left sitting edge of bed with RN present and all needs in reach.    Treatment 2: Patient in wheelchair upon arrival, daughter [name] present. Patient negotiated up/down 8 (3") stairs using R rail only with min A overall and step-to pattern. Performed stand pivot transfer wheelchair <> therapy mat and wheelchair > bed using PFRW with min A overall and decreased posterior lean. Patient stood using PFRW for LUE support while engaging in tabletop task using RUE with close  supervision x 45 sec + 2 min. Patient required prolonged seated rest breaks with all mobility due to SOB and fatigue. Patient requesting to return to bed at end of session, transferred sit > supine and left semi reclined with needs in reach and daughter present.   Therapy Documentation Precautions:  Precautions Precautions: Fall, Other (comment) Precaution Comments: monitor vitals Required Braces or Orthoses: Other Brace/Splint Other Brace/Splint: L short arm splint Restrictions Weight Bearing Restrictions: Yes LUE Weight Bearing: Partial weight bearing LUE Partial Weight Bearing Percentage or Pounds: platform WB through forearm ok Vital Signs: Therapy Vitals Temp: 98.3 F (36.8 C) Temp Source: Oral Pulse Rate: 66 Resp: 18 BP: (!) 90/43 mmHg (after ambulation) Patient Position (if appropriate): Sitting Oxygen Therapy SpO2: 95 % O2 Device: Not Delivered Pain: Pain Assessment Pain Assessment: No/denies pain  See Function Navigator for Current Functional Status.   Therapy/Group: Individual Therapy  Laretta Alstrom 04/12/2016, 8:59 AM
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
## 2016-04-13 ENCOUNTER — Inpatient Hospital Stay (HOSPITAL_COMMUNITY): Payer: Self-pay

## 2016-04-13 ENCOUNTER — Inpatient Hospital Stay (HOSPITAL_COMMUNITY): Payer: Medicare Other | Admitting: Physical Therapy

## 2016-04-13 ENCOUNTER — Encounter (HOSPITAL_COMMUNITY): Payer: Self-pay

## 2016-04-13 DIAGNOSIS — D62 Acute posthemorrhagic anemia: Secondary | ICD-10-CM

## 2016-04-13 DIAGNOSIS — I5043 Acute on chronic combined systolic (congestive) and diastolic (congestive) heart failure: Secondary | ICD-10-CM

## 2016-04-13 DIAGNOSIS — I951 Orthostatic hypotension: Secondary | ICD-10-CM

## 2016-04-13 DIAGNOSIS — I10 Essential (primary) hypertension: Secondary | ICD-10-CM

## 2016-04-13 LAB — BASIC METABOLIC PANEL
Anion gap: 11 (ref 5–15)
BUN: 17 mg/dL (ref 6–20)
CHLORIDE: 95 mmol/L — AB (ref 101–111)
CO2: 22 mmol/L (ref 22–32)
Calcium: 9.2 mg/dL (ref 8.9–10.3)
Creatinine, Ser: 1.09 mg/dL (ref 0.61–1.24)
GFR calc non Af Amer: >60 mL/min (ref >60)
Glucose, Bld: 82 mg/dL (ref 65–99)
POTASSIUM: 4.4 mmol/L (ref 3.5–5.1)
SODIUM: 128 mmol/L — AB (ref 135–145)

## 2016-04-13 LAB — CBC
HEMATOCRIT: 33.1 % — AB (ref 39.0–52.0)
HEMOGLOBIN: 11.2 g/dL — AB (ref 13.0–17.0)
MCH: 32.9 pg (ref 26.0–34.0)
MCHC: 33.8 g/dL (ref 30.0–36.0)
MCV: 97.4 fL (ref 78.0–100.0)
PLATELETS: 266 10*3/uL (ref 150–400)
RBC: 3.4 MIL/uL — AB (ref 4.22–5.81)
RDW: 14 % (ref 11.5–15.5)
WBC: 9.3 10*3/uL (ref 4.0–10.5)

## 2016-04-13 MED ORDER — CARVEDILOL 3.125 MG PO TABS
3.1250 mg | ORAL_TABLET | Freq: Two times a day (BID) | ORAL | Status: DC
  Administered 2016-04-13 – 2016-04-27 (×24): 3.125 mg via ORAL
  Filled 2016-04-13 (×29): qty 1
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
## 2016-04-13 NOTE — Progress Notes (Addendum)
Cardiologist: Drs. Hochrein, Klein Subjective:  BP dropping at times during therapy. Orthostatic like. Hyponatremic  Objective:  Vital Signs in the last 24 hours: Temp:  [98 F (36.7 C)-98.5 F (36.9 C)] 98 F (36.7 C) (06/23 0603) Pulse Rate:  [59-73] 73 (06/23 0905) Resp:  [16-18] 16 (06/23 0603) BP: (98-160)/(47-74) 132/53 mmHg (06/23 0905) SpO2:  [95 %-100 %] 99 % (06/23 0603) Weight:  [180 lb 12.4 oz (82 kg)] 180 lb 12.4 oz (82 kg) (06/23 0603)  Intake/Output from previous day: 06/22 0701 - 06/23 0700 In: 900 [P.O.:900] Out: [place] [Urine:1675]   Physical Exam: General: Well developed, well nourished, in no acute distress. Head:  Normocephalic and atraumatic. Lungs: Clear to auscultation and percussion. Flail like chest. Slightly increased work of breathing.  Heart: Normal S1 and S2.  2/6 Systolic murmur RUSB, no rubs or gallops.  Abdomen: soft, non-tender, positive bowel sounds. Extremities: No clubbing or cyanosis. +LE edema. Neurologic: Alert and oriented x 3.    Lab Results: No results for input(s): WBC, HGB, PLT in the last 72 hours.  Recent Labs  04/11/16 0710 04/12/16 0448  NA 126* 126*  K 4.7 5.0  CL 94* 94*  CO2 24 25  GLUCOSE 138* 97  BUN 19 18  CREATININE 1.26* 1.31*    Imaging: Dg Hand Complete Left  04/12/2016  CLINICAL DATA:  ORIF left hand fracture. EXAM: LEFT HAND - COMPLETE 3+ VIEW COMPARISON:  None. FINDINGS: Oblique displaced fracture of the second metacarpal shaft transfixed with 3 screws with approximately 4 mm of distraction between the fracture fragments. Oblique fracture of the third metacarpal shaft extending into the base transfixed with 3 screws. Displaced fracture of the base of the fourth metacarpal transfix with a K-wire. Minimally displaced fracture of the fifth metacarpal transfixed with a K-wire. No new fracture or dislocation. 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 IMPRESSION: Interval ORIF of second  through fifth metacarpal fractures. Electronically Signed   By: Kathreen Devoid   On: 04/12/2016 21:02   Personally viewed.   ECHO: 04/05/16: - Left ventricle: The cavity size was normal. There was mild  concentric hypertrophy. Systolic function was moderately reduced.  The estimated ejection fraction was in the range of 35% to 40%.  Severe hypokinesis of the inferoseptum and akinesis o the  inferior wall and basal to mid inferolateral walls. Doppler  parameters are consistent with abnormal left ventricular  relaxation (grade 1 diastolic dysfunction). Doppler parameters  are consistent with elevated ventricular end-diastolic filling  pressure. - Aortic valve: There was very mild stenosis. Peak velocity (S):  213 cm/s. Mean gradient (S): 9 mm Hg. Valve area (VTI): 1.53  cm^2. Valve area (Vmax): 1.43 cm^2. Valve area (Vmean): 1.35  cm^2. - Mitral valve: Calcified annulus. Transvalvular velocity was  within the normal range. There was no evidence for stenosis.  There was trivial regurgitation. - Left atrium: The atrium was mildly dilated. - Right ventricle: The cavity size was normal. Wall thickness was  normal. Systolic function was normal. - Right atrium: The atrium was moderately dilated. - Tricuspid valve: There was trivial regurgitation. - Pulmonary arteries: Systolic pressure was within the normal  range. PA peak pressure: 30 mm Hg (S).  Meds: Scheduled Meds: . allopurinol  100 mg Oral Daily  . amiodarone  200 mg Oral Daily  . atorvastatin  20 mg Oral q1800  . carvedilol  6.25 mg Oral BID WC  . clopidogrel  75 mg Oral Daily  .  feeding supplement (ENSURE ENLIVE)  237 mL Oral BID BM  . levothyroxine  25 mcg Oral QAC breakfast  . lisinopril  10 mg Oral Daily  . polyethylene glycol  17 g Oral BID  . senna-docusate  1 tablet Oral BID  . thiamine  100 mg Oral Daily   Continuous Infusions:  PRN Meds:.acetaminophen, albuterol, alum & mag hydroxide-simeth, bisacodyl,  bisacodyl, diphenhydrAMINE, guaiFENesin, guaiFENesin-dextromethorphan, HYDROcodone-acetaminophen, prochlorperazine **OR** prochlorperazine **OR** prochlorperazine, sodium chloride, sodium phosphate, traZODone  Assessment/Plan:  Principal Problem:   TBI (traumatic brain injury) ([place]) Active Problems:   Essential hypertension   Acute on chronic combined systolic and diastolic congestive heart failure (HCC)   Acute blood loss anemia   Orthostatic hypotension   Hyponatremia  Orthostatic hypotension  - Will stop lisinopril 10 (was held yesterday) (creat up slightly as well)  - this may help as well with hyponatremia  - resume low dose (2.5) when able  - Will decrease coreg to 3.125 BID from 6.25 BID  - Continue AMIO 200 QD since inciting event was VT  Chronic systolic HF  - lasix stopped.   - holding lisinopril (BP)  - pulling back on coreg  VT  - amio  - ICD replaced by Dr. Caryl Comes  - no driving 6 months  CAD  - Plavix, chronic circ occlusion. Med mgt.   - statin  Hyponatremia  - free water restrict  - has been issue throughout hospital stay  Will check up on weekend  [facility] 04/13/2016, 10:25 AM

## 2016-04-13 NOTE — Progress Notes (Signed)
Occupational Therapy Session Note  Patient Details  Name: [name] MRN: [id number] Date of Birth: 1930/12/08  Today's Date: 04/13/2016 OT Individual Time: 1000-1100 OT Individual Time Calculation (min): 60 min    Short Term Goals: Week 1:  OT Short Term Goal 1 (Week 1): Pt will complete UB dressing from w/c with less than 2 VC in regards to hem dressing technique OT Short Term Goal 2 (Week 1): Pt will complete toilet transfer completing stand pivot with  min A  OT Short Term Goal 3 (Week 1): Pt will complete grooming from w/c with less than 2 VC  OT Short Term Goal 4 (Week 1): Pt will donn pants with mod A  OT Short Term Goal 5 (Week 1): Pt will complete 5 minutes of a functional activity with 1 rest break from seated level to increase functional avitivity tolerance   Skilled Therapeutic Interventions/Progress Updates:    Pt resting in bed upon arrival, reading newspaper with daughter present.  Pt engaged in BADL retraining including bathing and dressing with sit<>stand from EOB.  Focus on sitting balance, sit<>stand, standing balance, compensatory strategies, and activity tolerance.  Pt performed supine>sit EOB at supervision level and initiated bathing tasks when presented with supplies.  Pt initially c/o "slight" dizziness but continued to engaged in BADLs.  Pt continues to fatigue quickly and requires multiple rest breaks during session.  Pt required min A for initial sit<>stand but was able to complete subsequent sit<>stand with supervision.  Pt erquired steady A for standing balance when bathing per[place]rea. Pt completed bathing and UB dressing and stated he was feeling a "little woozy." BP at 91/52.  Pt required an extended rest break before continuing with LB dressing.  Pt required tot A for donning pants this morning secondary to fatigue and increase dizziness with activity. Pt returned to bed with all needs within reach and daughter present.   Therapy Documentation Precautions:   Precautions Precautions: Fall, Other (comment) Precaution Comments: monitor vitals Required Braces or Orthoses: Other Brace/Splint Other Brace/Splint: L short arm splint Restrictions Weight Bearing Restrictions: Yes LUE Weight Bearing: Partial weight bearing LUE Partial Weight Bearing Percentage or Pounds: platform WB through forearm ok Pain: Pain Assessment Pain Assessment: No/denies pain  See Function Navigator for Current Functional Status.   Therapy/Group: Individual Therapy  [name] 04/13/2016, 11:01 AM

## 2016-04-13 NOTE — Progress Notes (Signed)
Physical Therapy Session Note  Patient Details  Name: [name] MRN: [id number] Date of Birth: July 17, 1931  Today's Date: 04/13/2016 PT Individual Time: 0800-0900 and 1300-1400 PT Individual Time Calculation (min): 60 min and 60 min  Short Term Goals: Week 1:  PT Short Term Goal 1 (Week 1): Patient will perform bed <> wheelchair transfers with consistent mod A.  PT Short Term Goal 2 (Week 1): Patient will ambulate 15 ft using LRAD with max A.  PT Short Term Goal 3 (Week 1): Patient will maintain dynamic standing balance x 1 min during functional task with mod A.  PT Short Term Goal 4 (Week 1): Paitent will initiate stair training.   Skilled Therapeutic Interventions/Progress Updates:   Treatment 1: Patient in bed upon arrival. Session focused on functional mobility training, postural control, generalized strengthening, and activity tolerance. Patient performed bed mobility re-training with HOB flat and use of rail with supervision, stand pivot transfers using PFRW with min A overall, donning/doffing socks seated in wheelchair with setup/supervision after donning TED hose total A, gait training using PFRW x 43 ft with steady assist, stair training up/down 4 (6") stairs using R rail only with self-elected step-to pattern and heavy min A, and simulated car transfer to sedan height using [place] with min A and initial demonstration for technique. Patient required prolonged rest breaks with all mobility due to SOB and fatigue. Patient requested to return to bed, left semi reclined in bed with all needs in reach.   Treatment 2: Patient in bed upon arrival, daughter present for second half of session. Session focused on functional mobility training, strengthening, and activity tolerance. Performed bed mobility with supervision and stand pivot transfer bed > wheelchair using [place] with close supervision. Patient propelled wheelchair to gym using RUE only with mod A. Patient performed stand pivot transfers  wheelchair <> therapy mat using PFRW with min A due to posterior lean with focus on not pushing with back of BLE against surfaces to achieve standing, hand placement, and eccentric control. Instructed in seated marching x 20 and standing heel raises x 20 and hip extension x 10 each LE using PFRW for UE support. Gait training using PFRW x 38 ft including 1 turn with close supervision and x 50 ft in straight path with close supervision. Patient required multiple prolonged rest breaks due to fatigue and increased SOB with minimal activity. Discussed increasing OOB tolerance with patient and daughter and patient agreeable to stay up in wheelchair in spite of originally requesting to return to bed. Patient left sitting in wheelchair with all needs in reach and daughter present.   Therapy Documentation Precautions:  Precautions Precautions: Fall, Other (comment) Precaution Comments: monitor vitals Required Braces or Orthoses: Other Brace/Splint Other Brace/Splint: L short arm splint Restrictions Weight Bearing Restrictions: Yes LUE Weight Bearing: Partial weight bearing LUE Partial Weight Bearing Percentage or Pounds: platform WB through forearm ok Vital Signs: Therapy Vitals Temp: 98 F (36.7 C) Temp Source: Oral Pulse Rate: 66 Resp: 16 BP: (!) 108/53 mmHg Patient Position (if appropriate): Sitting Oxygen Therapy SpO2: 99 % O2 Device: Not Delivered Pain: Pain Assessment Pain Assessment: No/denies pain   See Function Navigator for Current Functional Status.   Therapy/Group: Individual Therapy  Laretta Alstrom 04/13/2016, 8:50 AM

## 2016-04-14 ENCOUNTER — Inpatient Hospital Stay (HOSPITAL_COMMUNITY): Payer: Self-pay | Admitting: Physical Therapy

## 2016-04-14 ENCOUNTER — Inpatient Hospital Stay (HOSPITAL_COMMUNITY): Payer: Self-pay | Admitting: Occupational Therapy

## 2016-04-14 LAB — URINALYSIS, ROUTINE W REFLEX MICROSCOPIC
Bilirubin Urine: NEGATIVE
Glucose, UA: NEGATIVE mg/dL
Hgb urine dipstick: NEGATIVE
KETONES UR: NEGATIVE mg/dL
LEUKOCYTES UA: NEGATIVE
NITRITE: NEGATIVE
PH: 6 (ref 5.0–8.0)
PROTEIN: NEGATIVE mg/dL
Specific Gravity, Urine: 1.013 (ref 1.005–1.030)

## 2016-04-14 NOTE — Progress Notes (Signed)
Patient ID: RANSOME BENEDIX, male   DOB: October 02, 1931, 82 y.o.   MRN: [id number]   Cardiologist: Drs. Hochrein, Klein Subjective:  No complaints BP better   Objective:  BP 141/64 mmHg  Pulse 64  Temp(Src) 98.2 F (36.8 C) (Oral)  Resp 18  Ht 5\' 11"  (1.803 m)  Wt 192 lb 3.9 oz (87.2 kg)  BMI 26.82 kg/m2  SpO2 96%  Intake/Output from previous day: 06/23 0701 - 06/24 0700 In: 1200 [P.O.:1200] Out: 1475 [Urine:1475]   Physical Exam: General: Well developed, well nourished, in no acute distress. Head:  Normocephalic and atraumatic. Lungs: Clear to auscultation and percussion. Flail like chest. Slightly increased work of breathing.  Heart: Normal S1 and S2.  2/6 Systolic murmur RUSB, no rubs or gallops.  Abdomen: soft, non-tender, positive bowel sounds. Extremities: No clubbing or cyanosis. +LE edema. Bruising both LE;s Neurologic: Alert and oriented x 3. AICD pocket healing well LUE in cast post orthopedic surgery for MCP fracture   Lab Results:  Recent Labs  04/13/16 1019  WBC 9.3  HGB 11.2*  PLT 266    Recent Labs  04/12/16 0448 04/13/16 1019  NA 126* 128*  K 5.0 4.4  CL 94* 95*  CO2 25 22  GLUCOSE 97 82  BUN 18 17  CREATININE 1.31* 1.09    Imaging: Dg Hand Complete Left  04/12/2016  CLINICAL DATA:  ORIF left hand fracture. EXAM: LEFT HAND - COMPLETE 3+ VIEW COMPARISON:  None. FINDINGS: Oblique displaced fracture of the second metacarpal shaft transfixed with 3 screws with approximately 4 mm of distraction between the fracture fragments. Oblique fracture of the third metacarpal shaft extending into the base transfixed with 3 screws. Displaced fracture of the base of the fourth metacarpal transfix with a K-wire. Minimally displaced fracture of the fifth metacarpal transfixed with a K-wire. No new fracture or dislocation. 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 IMPRESSION: Interval ORIF of second through fifth metacarpal fractures.  Electronically Signed   By: Kathreen Devoid   On: 04/12/2016 21:02   Personally viewed.   ECHO: 04/05/16: - Left ventricle: The cavity size was normal. There was mild  concentric hypertrophy. Systolic function was moderately reduced.  The estimated ejection fraction was in the range of 35% to 40%.  Severe hypokinesis of the inferoseptum and akinesis o the  inferior wall and basal to mid inferolateral walls. Doppler  parameters are consistent with abnormal left ventricular  relaxation (grade 1 diastolic dysfunction). Doppler parameters  are consistent with elevated ventricular end-diastolic filling  pressure. - Aortic valve: There was very mild stenosis. Peak velocity (S):  213 cm/s. Mean gradient (S): 9 mm Hg. Valve area (VTI): 1.53  cm^2. Valve area (Vmax): 1.43 cm^2. Valve area (Vmean): 1.35  cm^2. - Mitral valve: Calcified annulus. Transvalvular velocity was  within the normal range. There was no evidence for stenosis.  There was trivial regurgitation. - Left atrium: The atrium was mildly dilated. - Right ventricle: The cavity size was normal. Wall thickness was  normal. Systolic function was normal. - Right atrium: The atrium was moderately dilated. - Tricuspid valve: There was trivial regurgitation. - Pulmonary arteries: Systolic pressure was within the normal  range. PA peak pressure: 30 mm Hg (S).  Meds: Scheduled Meds: . allopurinol  100 mg Oral Daily  . amiodarone  200 mg Oral Daily  . atorvastatin  20 mg Oral q1800  . carvedilol  3.125 mg Oral BID WC  . clopidogrel  75  mg Oral Daily  . feeding supplement (ENSURE ENLIVE)  237 mL Oral BID BM  . levothyroxine  25 mcg Oral QAC breakfast  . polyethylene glycol  17 g Oral BID  . senna-docusate  1 tablet Oral BID  . thiamine  100 mg Oral Daily   Continuous Infusions:  PRN Meds:.acetaminophen, albuterol, alum & mag hydroxide-simeth, bisacodyl, bisacodyl, diphenhydrAMINE, guaiFENesin,  guaiFENesin-dextromethorphan, HYDROcodone-acetaminophen, prochlorperazine **OR** prochlorperazine **OR** prochlorperazine, sodium chloride, sodium phosphate, traZODone  Assessment/Plan:  Principal Problem:   TBI (traumatic brain injury) ([place]) Active Problems:   Essential hypertension   Acute on chronic combined systolic and diastolic congestive heart failure (HCC)   Acute blood loss anemia   Orthostatic hypotension   Hyponatremia  Orthostatic hypotension - beta blocker decreased and ACE d/c yesterday by Dr Marlou Porch   - Continue AMIO 200 QD since inciting event was VT  Chronic systolic HF  - lasix stopped.   - holding lisinopril (BP)  - pulling back on coreg  VT  - amio  - ICD replaced by Dr. Caryl Comes  - no driving 6 months  CAD  - Plavix, chronic circ occlusion. Med mgt.   - statin  Hyponatremia  - free water restrict  - has been issue throughout hospital stay  Lab Results  Component Value Date   CREATININE 1.09 04/13/2016   BUN 17 04/13/2016   NA 128* 04/13/2016   K 4.4 04/13/2016   CL 95* 04/13/2016   CO2 22 04/13/2016     Emeri Estill 04/14/2016, 9:39 AM

## 2016-04-14 NOTE — Progress Notes (Signed)
Physical Therapy Session Note  Patient Details  Name: [name] MRN: [id number] Date of Birth: [birth year]  Today's Date: 04/14/2016 PT Individual Time: [id number] PT Individual Time Calculation (min): 30 min   Short Term Goals: Week 1:  PT Short Term Goal 1 (Week 1): Patient will perform bed <> wheelchair transfers with consistent mod A.  PT Short Term Goal 2 (Week 1): Patient will ambulate 15 ft using LRAD with max A.  PT Short Term Goal 3 (Week 1): Patient will maintain dynamic standing balance x 1 min during functional task with mod A.  PT Short Term Goal 4 (Week 1): Paitent will initiate stair training.  Week 2:     Skilled Therapeutic Interventions/Progress Updates:    Patient received sitting in recliner and agreeable to PT. Patient performed gait training in room for 109ft with [name]. PT transferred patient to gym in [facility] with total A for time management.    Gait training performed for 62 ft and 2 turns x2 with RW and steady A from PT. Min cues for equal step length and decreased cadence to improve safety. PT also required to provided min cues for RW management in turns and to maintain COM within RW. Only minor change in gait pattern following instruction from PT. Pt required prolonged rest breaks following each bout of gait training secondary to increased SOB and fatigue. SpO2 measured at 99%.   Patient performed sit<>stand transfer x 5 throughout treatment with min A from PT and cues for improved anterior weight shift. At end of treatment patient performed sit>supine in bed with supervision A from PT with min cues for prevent WB through LUE.     Patient left in bed with call bell within reach.     Therapy Documentation Precautions:  Precautions Precautions: Fall, Other (comment) Precaution Comments: monitor vitals Required Braces or Orthoses: Other Brace/Splint Other Brace/Splint: L short arm splint Restrictions Weight Bearing Restrictions: Yes LUE Weight Bearing:  Partial weight bearing LUE Partial Weight Bearing Percentage or Pounds:  (PLatform walker) General:   Vital Signs: Therapy Vitals Temp: 97.7 F (36.5 C) Temp Source: Oral Pulse Rate: 64 Resp: 18 BP: 135/61 mmHg Patient Position (if appropriate): Sitting Oxygen Therapy SpO2: 99 % O2 Device: Not Delivered   See Function Navigator for Current Functional Status.   Therapy/Group: Individual Therapy  [name] 04/14/2016, 5:29 PM

## 2016-04-14 NOTE — Plan of Care (Signed)
Problem: RH SKIN INTEGRITY Goal: RH STG ABLE TO PERFORM INCISION/WOUND CARE W/ASSISTANCE STG Able To Perform Incision/Wound Care With Mod Assistance.  Outcome: Not Progressing Patient requires total assist with wound care at this time
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
## 2016-04-15 ENCOUNTER — Inpatient Hospital Stay (HOSPITAL_COMMUNITY): Payer: Medicare Other | Admitting: Occupational Therapy

## 2016-04-15 ENCOUNTER — Inpatient Hospital Stay (HOSPITAL_COMMUNITY): Payer: Medicare Other | Admitting: Physical Therapy

## 2016-04-15 DIAGNOSIS — R350 Frequency of micturition: Secondary | ICD-10-CM

## 2016-04-15 LAB — URINE CULTURE

## 2016-04-15 NOTE — Progress Notes (Signed)
Occupational Therapy Session Note  Patient Details  Name: [name] MRN: [id number] Date of Birth: 1931/06/30  Today's Date: 04/15/2016 OT Individual Time: 0940-1040 OT Individual Time Calculation (min): 60 min    Short Term Goals: Week 1:  OT Short Term Goal 1 (Week 1): Pt will complete UB dressing from w/c with less than 2 VC in regards to hem dressing technique OT Short Term Goal 2 (Week 1): Pt will complete toilet transfer completing stand pivot with  min A  OT Short Term Goal 3 (Week 1): Pt will complete grooming from w/c with less than 2 VC  OT Short Term Goal 4 (Week 1): Pt will donn pants with mod A  OT Short Term Goal 5 (Week 1): Pt will complete 5 minutes of a functional activity with 1 rest break from seated level to increase functional avitivity tolerance   Skilled Therapeutic Interventions/Progress Updates: Patient sitting in chair upon approach and participated in bathing and dressing in w/c at sink as follows:  UB Bathing and dressing = setup and extra time for rest breaks and energy conservation due to fatigue and COPD requirements LB bathing and dressing=moderate assistance and extrat time and rest breaks  elecric shaving=setup  Oral care=setup  Patient was left lying in bed for rest break with call bell and alarm in place     Therapy Documentation Precautions:  Precautions Precautions: Fall, Other (comment) Precaution Comments: monitor vitals Required Braces or Orthoses: Other Brace/Splint Other Brace/Splint: L short arm splint Restrictions Weight Bearing Restrictions: Yes LUE Weight Bearing: Partial weight bearing LUE Partial Weight Bearing Percentage or Pounds:  (PLatform walker) Pain:denied     See Function Navigator for Current Functional Status.   Therapy/Group: Individual Therapy  Alfredia Ferguson [facility] 04/15/2016, 12:44 PM

## 2016-04-15 NOTE — Progress Notes (Signed)
Physical Therapy Session Note  Patient Details  Name: [name] MRN: [id number] Date of Birth: [birth year]  Today's Date: 04/15/2016 PT Individual Time: [phone] and 1550-1630 PT Individual Time Calculation (min): 45 min and 40 min PT Concurrent Time: 845-900 PT Concurrent Time 15 min  Short Term Goals: Week 1:  PT Short Term Goal 1 (Week 1): Patient will perform bed <> wheelchair transfers with consistent mod A.  PT Short Term Goal 2 (Week 1): Patient will ambulate 15 ft using LRAD with max A.  PT Short Term Goal 3 (Week 1): Patient will maintain dynamic standing balance x 1 min during functional task with mod A.  PT Short Term Goal 4 (Week 1): Paitent will initiate stair training.   Skilled Therapeutic Interventions/Progress Updates:   Treatment 1: Session focused on sit <> stand transfers using PFRW with focus on improved anterior weight shift with supervision, gait training using PFRW x 140 ft in controlled environment and 2 x 10 ft in carpeted home environment with close supervision, furniture transfers to low compliant couch surface using PFRW with multiple attempts and verbal/visual cues for technique and anterior weight shift and min A overall, bed mobility re-training on regular bed on 3" risers in ADL apartment with supervision, seated neuro re-ed for BLE and cardiorespiratory endurance via kinetron at 0.2 cm/sec resistance with 30 sec trials followed by 30 sec rest to fatigue, and discharge planning for DME needs (recommending transport chair to decrease caregiver burden and PFRW) and  f/u HHPT. Patient left sitting in wheelchair with all needs in reach.   Treatment 2: Patient in bed with daughter present. Patient/family education regarding progress toward supervision level goals, DME needs, and f/u HHPT, both verbalized understanding. Session focused on bed mobility with supervision, sit <> stand transfers from bed, wheelchair, and arm chair with cues for upright posture and  supervision, gait using PFRW x 140 ft + 50 ft with supervision, and initiating hands-on family training with daughter to negotiate up/down 4 (6") stairs using R rail only with step-to pattern and steady assist. Patient left sitting in wheelchair with all needs in reach, safety plan updated to clear daughter to assist patient in room. Patient continues to require significant rest breaks following all functional mobility tasks due to SOB and fatigue.    Therapy Documentation Precautions:  Precautions Precautions: Fall, Other (comment) Precaution Comments: monitor vitals Required Braces or Orthoses: Other Brace/Splint Other Brace/Splint: L short arm splint Restrictions Weight Bearing Restrictions: Yes LUE Weight Bearing: Partial weight bearing LUE Partial Weight Bearing Percentage or Pounds: platform WB through forearm ok Pain: Pain Assessment Pain Assessment: No/denies pain   See Function Navigator for Current Functional Status.   Therapy/Group: Individual Therapy and Concurrent Therapy  Laretta Alstrom 04/15/2016, 8:37 AM

## 2016-04-15 NOTE — Progress Notes (Signed)
Rested without complaint of. Scheduled Miralax and senna s held per patient's request. Requires assist with urinal. (+) circulation and sensation to fingers on left hand, minimal movement to fingers, unable to straighten fingers. Left chest incision OTA. Bilateral heels elevated off bed with pillows. Patrici Ranks A
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
## 2016-04-15 NOTE — Progress Notes (Signed)
Occupational Therapy Session Note  Patient Details  Name: [name] MRN: [id number] Date of Birth: 04-29-1931  Today's Date: 04/15/2016 OT Individual Time: 1415-1300 OT Individual Time Calculation (min): 1365 min    Short Term Goals: Week 1:  OT Short Term Goal 1 (Week 1): Pt will complete UB dressing from w/c with less than 2 VC in regards to hem dressing technique OT Short Term Goal 2 (Week 1): Pt will complete toilet transfer completing stand pivot with  min A  OT Short Term Goal 3 (Week 1): Pt will complete grooming from w/c with less than 2 VC  OT Short Term Goal 4 (Week 1): Pt will donn pants with mod A  OT Short Term Goal 5 (Week 1): Pt will complete 5 minutes of a functional activity with 1 rest break from seated level to increase functional avitivity tolerance   Skilled Therapeutic Interventions/Progress Updates:      Therapy Documentation Precautions:  Precautions Precautions: Fall, Other (comment) Precaution Comments: monitor vitals Required Braces or Orthoses: Other Brace/Splint Other Brace/Splint: L short arm splint Restrictions Weight Bearing Restrictions: Yes LUE Weight Bearing: Partial weight bearing LUE Partial Weight Bearing Percentage or Pounds:  (PLatform walker)    Vital Signs:128/54 lying supine with head of bed elevated approximately 25 degrees.   Elected to treat patient supine in bed and participate in endurance and exercises.  Pain:denied    See Function Navigator for Current Functional Status.   Therapy/Group: Individual Therapy  Herschell Dimes 04/15/2016, 2:40 PM

## 2016-04-16 ENCOUNTER — Inpatient Hospital Stay (HOSPITAL_COMMUNITY): Payer: Self-pay

## 2016-04-16 ENCOUNTER — Inpatient Hospital Stay (HOSPITAL_COMMUNITY): Payer: Self-pay | Admitting: Physical Therapy

## 2016-04-16 ENCOUNTER — Ambulatory Visit: Payer: Medicare Other

## 2016-04-16 ENCOUNTER — Inpatient Hospital Stay (HOSPITAL_COMMUNITY): Payer: Medicare Other | Admitting: Physical Therapy

## 2016-04-16 DIAGNOSIS — I110 Hypertensive heart disease with heart failure: Secondary | ICD-10-CM

## 2016-04-16 NOTE — Progress Notes (Signed)
Occupational Therapy Session Note  Patient Details  Name: [name] MRN: [id number] Date of Birth: Feb 23, 1931  Today's Date: 04/16/2016 OT Individual Time: 1000-1100 OT Individual Time Calculation (min): 60 min    Short Term Goals: Week 1:  OT Short Term Goal 1 (Week 1): Pt will complete UB dressing from w/c with less than 2 VC in regards to hem dressing technique OT Short Term Goal 2 (Week 1): Pt will complete toilet transfer completing stand pivot with  min A  OT Short Term Goal 3 (Week 1): Pt will complete grooming from w/c with less than 2 VC  OT Short Term Goal 4 (Week 1): Pt will donn pants with mod A  OT Short Term Goal 5 (Week 1): Pt will complete 5 minutes of a functional activity with 1 rest break from seated level to increase functional avitivity tolerance   Skilled Therapeutic Interventions/Progress Updates:    Pt resting in recliner upon arrival and agreeable to engaging in BADL retraining including bathing and dressing with sit<>stand from w/c at sink.  Pt requires assistance with LB bathing and dressing tasks.  Pt requires more than a reasonable amount of time to complete all tasks with multiple rest breaks.  Pt continues to exhibit SOB and fatigue with all tasks.  Pt also practiced tub transfer bench transfers in ADL bathroom at steady A level.  Pt agreeable to recommendation to use tub transfer bench at home.  CSW notified.  Discussed shower in ADL apartment tomorrow.  Pt's daughter arrived near end of session.  Pt and daughter mentioned that PT had discussed discharge on 6/30 vs 7/7.  I informed them I wanted to see how pt did with shower tomorrow and that the goals were that pt would not required any physical assistance with BADLs. Pt and daughter verbalized understanding.    Therapy Documentation Precautions:  Precautions Precautions: Fall, Other (comment) Precaution Comments: monitor vitals Required Braces or Orthoses: Other Brace/Splint Other Brace/Splint: L short  arm splint Restrictions Weight Bearing Restrictions: Yes LUE Weight Bearing: Partial weight bearing LUE Partial Weight Bearing Percentage or Pounds:  (PLatform walker) Pain: Pain Assessment Pain Assessment: No/denies pain   See Function Navigator for Current Functional Status.   Therapy/Group: Individual Therapy  [name] 04/16/2016, 11:56 AM
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
## 2016-04-16 NOTE — Progress Notes (Signed)
Physical Therapy Session Note  Patient Details  Name: [name] MRN: [id number] Date of Birth: December 18, 1930  Today's Date: 04/16/2016 PT Group Time: 8295-6213 PT Group Time Calculation (min): 75 min  Short Term Goals: Week 1:  PT Short Term Goal 1 (Week 1): Patient will perform bed <> wheelchair transfers with consistent mod A.  PT Short Term Goal 2 (Week 1): Patient will ambulate 15 ft using LRAD with max A.  PT Short Term Goal 3 (Week 1): Patient will maintain dynamic standing balance x 1 min during functional task with mod A.  PT Short Term Goal 4 (Week 1): Paitent will initiate stair training.   Skilled Therapeutic Interventions/Progress Updates:    Pt received in therapy gym, no c/o pain, and agreeable to group therapy session.  Session focus on LE therex and overall endurance.  PT instructed pt in 2x15 reps of all exercises with 2# ankle weights and level 3 tband: LAQ with 3 second hold, seated hip flexion, isometric hip add, hip abd, hamstring curls, SAQ, and ankle alphabet.  Therapeutic rest breaks provided as needed.  PT instructed pt in w/c obstacle course focus on use of BLEs for overall strength, endurance, and coordination.  Pt returned to room at end of session and positioned in bed with steady assist, call bell in reach, and needs met.   Therapy Documentation Precautions:  Precautions Precautions: Fall, Other (comment) Precaution Comments: monitor vitals Required Braces or Orthoses: Other Brace/Splint Other Brace/Splint: L short arm splint Restrictions Weight Bearing Restrictions: Yes LUE Weight Bearing: Partial weight bearing LUE Partial Weight Bearing Percentage or Pounds:  (PLatform walker)   See Function Navigator for Current Functional Status.   Therapy/Group: Group Therapy  [name] 04/16/2016, 4:39 PM
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
## 2016-04-16 NOTE — Progress Notes (Signed)
Physical Therapy Session Note  Patient Details  Name: [name] MRN: [id number] Date of Birth: Feb 03, 1931  Today's Date: 04/16/2016 PT Individual Time: 0800-0900 60 minutes     Short Term Goals: Week 1:  PT Short Term Goal 1 (Week 1): Patient will perform bed <> wheelchair transfers with consistent mod A.  PT Short Term Goal 2 (Week 1): Patient will ambulate 15 ft using LRAD with max A.  PT Short Term Goal 3 (Week 1): Patient will maintain dynamic standing balance x 1 min during functional task with mod A.  PT Short Term Goal 4 (Week 1): Paitent will initiate stair training.   Skilled Therapeutic Interventions/Progress Updates:   Pt received seated EOB eating breakfast (served late); pt denies pain.  Assisted pt with set up of breakfast and while pt ate therapist reviewed pt progress and possibility of shortened LOS.  Confirmed D/C plan with pt and home set up.  Also provided pt with handout of HEP, demonstrated each exercise to pt and discussed safety recommendations.  Assisted pt with donning TEDs, socks and pants seated EOB; performed sit > stand from bed with min and pt stood with UE supported on PRFW while therapist pulled up pants.  Transferred with lateral stepping to chair to rest his back with min A and verbal cues to sequence.  Performed oral hygiene at sink in standing without RUE support but with min-mod A of therapist providing support at L elbow and at pelvis to maintain COG over BOS and prevent posterior LOB.  Pt able to stand x 1 min to perform oral hygiene but required seated rest break before performing standing HEP.  Pt performed OTAGO in sitting and standing with UE support on sink and therapist supporting L elbow-required verbal cues not to put L hand down on sink and to maintain weight through forearm/elbow; pt required seated rest breaks in between each exercise due to SOB.  Pt Sp02 97-100% and HR: 75-85 bpm during rest breaks.  Will need to review exercises with daughter  prior to D/C.  At end of session pt ambulated to recliner with PFRW and min A with verbal cues for safe sequence and to maintain safe distance to RW.  Pt left in recliner to rest with all items within reach.  Therapy Documentation Precautions:  Precautions Precautions: Fall, Other (comment) Precaution Comments: monitor vitals Required Braces or Orthoses: Other Brace/Splint Other Brace/Splint: L short arm splint Restrictions Weight Bearing Restrictions: Yes LUE Weight Bearing: Partial weight bearing LUE Partial Weight Bearing Percentage or Pounds:  (PLatform walker) Vital Signs: Therapy Vitals Pulse Rate: 68 BP: 130/80 mmHg Pain: Pain Assessment Pain Assessment: No/denies pain Patients Stated Pain Goal: 2 Mobility:   Locomotion :    Trunk/Postural Assessment :    Balance:   Exercises:   Other Treatments:     See Function Navigator for Current Functional Status.   Therapy/Group: Individual Therapy  Raylene Everts [facility] 04/16/2016, 12:15 PM

## 2016-04-17 ENCOUNTER — Inpatient Hospital Stay (HOSPITAL_COMMUNITY): Payer: Medicare Other | Admitting: Physical Therapy

## 2016-04-17 ENCOUNTER — Inpatient Hospital Stay (HOSPITAL_COMMUNITY): Payer: Self-pay

## 2016-04-17 ENCOUNTER — Inpatient Hospital Stay (HOSPITAL_COMMUNITY): Payer: Medicare Other | Admitting: *Deleted
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
## 2016-04-17 NOTE — Progress Notes (Signed)
Occupational Therapy Session Note  Patient Details  Name: [name] MRN: [id number] Date of Birth: 1931-08-29  Today's Date: 04/17/2016 OT Individual Time: 1000-1130 OT Individual Time Calculation (min): 90 min    Short Term Goals: Week 1:  OT Short Term Goal 1 (Week 1): Pt will complete UB dressing from w/c with less than 2 VC in regards to hem dressing technique OT Short Term Goal 2 (Week 1): Pt will complete toilet transfer completing stand pivot with  min A  OT Short Term Goal 3 (Week 1): Pt will complete grooming from w/c with less than 2 VC  OT Short Term Goal 4 (Week 1): Pt will donn pants with mod A  OT Short Term Goal 5 (Week 1): Pt will complete 5 minutes of a functional activity with 1 rest break from seated level to increase functional avitivity tolerance   Skilled Therapeutic Interventions/Progress Updates:    Pt resting in recliner upon arrival.  Pt engaged in BADL retraining including bathing at tub/shower level and dressing with sit<>stand from bench. Pt educated in compensatory strategies/techniques for bathing his RUE. Pt return demonstrated techniques; will require additional practice.  Pt continues to exhibit difficulty threading BLE into pants but has demonstrated improvement.  Pt requires steady A when standing in the tub to bathe his buttocks and when standing to pull up pants.  Pt requires multiple rest breaks and more than a reasonable amount of time to complete all BADL tasks secondary to SOB. Pt amb from ADL Tub Room to elevators without a rest break.  Pt returned to bed upon return to room.  Discussed establishing a home schedule and energy conservation strategies with pt and daughter [name]. Focus on activity tolerance, sit<>stand, standing balance, functional amb with RW, compensatory strategies/techniques, energy conservation strategies, discharge planning, and safety awareness to increase independence with BADLs.   Therapy Documentation Precautions:   Precautions Precautions: Fall, Other (comment) Precaution Comments: monitor vitals Required Braces or Orthoses: Other Brace/Splint Other Brace/Splint: L short arm splint Restrictions Weight Bearing Restrictions: Yes LUE Weight Bearing: Weight bearing as tolerated (through forarm) LUE Partial Weight Bearing Percentage or Pounds:  (PLatform walker)   Pain: Pain Assessment Pain Assessment: No/denies pain  See Function Navigator for Current Functional Status.   Therapy/Group: Individual Therapy  [name] 04/17/2016, 11:37 AM

## 2016-04-17 NOTE — Progress Notes (Signed)
Occupational Therapy Weekly Progress Note  Patient Details  Name: [name] MRN: [id number] Date of Birth: Jan 10, 1931  Beginning of progress report period: April 10, 2016 End of progress report period: April 17, 2016  Patient has met 5 of 5 short term goals.  Pt has made steady progress with BADLs since admission and continues to require steady A when standing for LB bathing and dressing tasks as well as clothing management when toileting.  Pt continues to exhibit SOB with activity and requires more than a reasonable amount of time with multiple rest breaks to complete BADLs. Pt's daughter [name]) has observed during therapy sessions.   Patient continues to demonstrate the following deficits: abnormal posture, acute pain, ataxia, cognitive deficits, muscle weakness (generalized) and swelling of limb and therefore will continue to benefit from skilled OT intervention to enhance overall performance with BADL, iADL and Reduce care partner burden.  Patient progressing toward long term goals..  Continue plan of care.  OT Short Term Goals Week 1:  OT Short Term Goal 1 (Week 1): Pt will complete UB dressing from w/c with less than 2 VC in regards to hem dressing technique OT Short Term Goal 1 - Progress (Week 1): Met OT Short Term Goal 2 (Week 1): Pt will complete toilet transfer completing stand pivot with  min A  OT Short Term Goal 2 - Progress (Week 1): Met OT Short Term Goal 3 (Week 1): Pt will complete grooming from w/c with less than 2 VC  OT Short Term Goal 3 - Progress (Week 1): Met OT Short Term Goal 4 (Week 1): Pt will donn pants with mod A  OT Short Term Goal 4 - Progress (Week 1): Met OT Short Term Goal 5 (Week 1): Pt will complete 5 minutes of a functional activity with 1 rest break from seated level to increase functional avitivity tolerance  OT Short Term Goal 5 - Progress (Week 1): Met Week 2:  OT Short Term Goal 1 (Week 2): STG=LTG secondary to ELOS   Therapy  Documentation Precautions:  Precautions Precautions: Fall, Other (comment) Precaution Comments: monitor vitals Required Braces or Orthoses: Other Brace/Splint Other Brace/Splint: L short arm splint Restrictions Weight Bearing Restrictions: Yes LUE Weight Bearing: Weight bearing as tolerated (through forarm) LUE Partial Weight Bearing Percentage or Pounds:  (PLatform walker)  See Function Navigator for Current Functional Status.   Therapy/Group:   [name] 04/17/2016, 11:50 AM

## 2016-04-17 NOTE — Progress Notes (Signed)
Recreational Therapy Session Note  Patient Details  Name: [name] MRN: [id number] Date of Birth: January 30, 1931 Today's Date: 04/17/2016  Pain: no c/o  Me with pt to discuss leisure interests, use of leisure time post discharge, potential modification/adaptation for safe participation & community pursuits.  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  Discussed activity analysis process and began problem solving through adaptations he could easily make for leisure activities he listed during my visit.  Pt appreciative of this visit and the information & states that he is anxious to go home. No further TR.  Will continue to monitor through team.   Elisabeth Strom 04/17/2016, 12:58 PM

## 2016-04-17 NOTE — Progress Notes (Signed)
Physical Therapy Session Note  Patient Details  Name: [name] MRN: [id number] Date of Birth: October 13, 1931  Today's Date: 04/17/2016 PT Individual Time: 0800-0900 and 1535-1630 PT Individual Time Calculation (min): 60 min and 55 min  Short Term Goals: Week 1:  PT Short Term Goal 1 (Week 1): Patient will perform bed <> wheelchair transfers with consistent mod A.  PT Short Term Goal 2 (Week 1): Patient will ambulate 15 ft using LRAD with max A.  PT Short Term Goal 3 (Week 1): Patient will maintain dynamic standing balance x 1 min during functional task with mod A.  PT Short Term Goal 4 (Week 1): Paitent will initiate stair training.   Skilled Therapeutic Interventions/Progress Updates:   Treatment 1: Focus on functional transfers, ambulation, standing tolerance, and endurance. Patient sat edge of bed to consume breakfast meal. Performed sit <> stand transfers with supervision and verbal cues for upright posture. Gait training using PFRW 2 x 150 ft with supervision and min verbal cues for keeping body closer to RW. Instructed in sit <> stand without AD from standard arm chair x 5 with RUE support with supervision and focus on anterior weight shift. Initiated Berg Balance Scale to address standing balance and endurance, plan to continue at next session. Patient continues to require prolonged seated rest breaks following activity due to fatigue and SOB. Patient left sitting in recliner with all needs in reach.   Treatment 2: Patient in bed with daughters present. Session focused on sit <> stand transfers with focus on anterior weight shift with supervision, gait training using [place] 2 x 150 ft with supervision, and continued balance re-training with completion of Berg Balance Scale initiated this AM. Patient demonstrates high fall risk as noted by score of 27/56 on Berg Balance Scale. Patient required seated rest breaks between tasks due to SOB and fatigue. Patient's daughter [name] provided supervision  for ambulation back to room and patient left semi reclined in bed with needs in reach and family present.   Therapy Documentation Precautions:  Precautions Precautions: Fall, Other (comment) Precaution Comments: monitor vitals Required Braces or Orthoses: Other Brace/Splint Other Brace/Splint: L short arm splint Restrictions Weight Bearing Restrictions: Yes LUE Weight Bearing: Partial weight bearing LUE Partial Weight Bearing Percentage or Pounds:  (PLatform walker) Vital Signs: Therapy Vitals Temp: 97.9 F (36.6 C) Temp Source: Oral Pulse Rate: 63 Resp: 18 BP: 137/68 mmHg Patient Position (if appropriate): Lying Oxygen Therapy SpO2: 95 % O2 Device: Not Delivered Pain: Pain Assessment Pain Assessment: No/denies pain   See Function Navigator for Current Functional Status.   Therapy/Group: Individual Therapy  Laretta Alstrom 04/17/2016, 7:42 AM

## 2016-04-18 ENCOUNTER — Inpatient Hospital Stay (HOSPITAL_COMMUNITY): Payer: Medicare Other | Admitting: Physical Therapy

## 2016-04-18 ENCOUNTER — Inpatient Hospital Stay (HOSPITAL_COMMUNITY): Payer: Self-pay

## 2016-04-18 ENCOUNTER — Inpatient Hospital Stay (HOSPITAL_COMMUNITY): Payer: Self-pay | Admitting: Speech Pathology

## 2016-04-18 NOTE — Progress Notes (Signed)
Speech Language Pathology Weekly Progress and Session Note  Patient Details  Name: [name] MRN: [id number] Date of Birth: [birth year]  Beginning of progress report period: April 09, 2016 End of progress report period: April 18, 2016  Today's Date: 04/18/2016 SLP Individual Time: 1000-1100 SLP Individual Time Calculation (min): 60 min  Short Term Goals: Week 1: SLP Short Term Goal 1 (Week 1): STG=LTG due to length of stay    New Short Term Goals: Week 2: SLP Short Term Goal 1 (Week 2): STG=LTG due to length of stay  Weekly Progress Updates:  Pt has been focusing on PT/OT needs this past week. He currently requires min A for complex level cognitive-linguistic activities such as money counting and medication management. Discussed with pt and daughter- they verbalize understanding.   Intensity: Minumum of 1-2 x/day, 30 to 90 minutes Frequency: 1 to 3 out of 7 days Duration/Length of Stay: 10 days Treatment/Interventions: Cognitive remediation/compensation;Cueing hierarchy;Environmental controls;Functional tasks;Internal/external aids;Medication managment;Patient/family education;Therapeutic Activities   Daily Session  Skilled Therapeutic Interventions: Pt required min A to complete basic money counting task. Pt insisted the difficulty was secondary to use of fake coins. Will reattempt next date with actual money. 100% acc for math word problems. Min A for check writing- knowing what information belonged where. Education provided re: current limitations and need for assist/supervision with high level cognitive tasks in the home environment. Pt verbalized agreement.     Function:     Cognition Comprehension Comprehension assist level: Understands complex 90% of the time/cues 10% of the time  Expression   Expression assist level: Expresses complex 90% of the time/cues < 10% of the time  Social Interaction Social Interaction assist level: Interacts appropriately with others with  medication or extra time (anti-anxiety, antidepressant).  Problem Solving Problem solving assist level: Solves basic problems with no assist  Memory Memory assist level: Recognizes or recalls 90% of the time/requires cueing < 10% of the time   General    Pain Pain Assessment Pain Assessment: No/denies pain  Therapy/Group: Individual Therapy  Vinetta Bergamo MA, CCC-SLP 04/18/2016, 1:24 PM

## 2016-04-18 NOTE — Progress Notes (Signed)
Occupational Therapy Session Note  Patient Details  Name: [name] MRN: [id number] Date of Birth: December 06, 1930  Today's Date: 04/18/2016 OT Individual Time: 1300-1400 OT Individual Time Calculation (min): 60 min    Short Term Goals: Week 2:  OT Short Term Goal 1 (Week 2): STG=LTG secondary to ELOS  Skilled Therapeutic Interventions/Progress Updates:    Pt resting in recliner upon arrival.  Pt declined bathing or changing pants but changed his shirt before amb with PFRW to gym.  Pt engaged in dynamic standing tasks on compliant and noncompliant surfaces.  Pt also engaged in retrieving items from floor with reacher.  Pt amb with PFRW to ADL apartment and engaged in simple kitchen activities with focus on RW safety and general safety procedures.  Pt returned to room and requested to return to bed.  Pt remained in bed with NT present.  Pt continues to require multiple rest breaks and exhibits SOB after activities of approx 1-2 mins in duration.  Discussed energy conservation strategies.   Therapy Documentation Precautions:  Precautions Precautions: Fall, Other (comment) Precaution Comments: monitor vitals Required Braces or Orthoses: Other Brace/Splint Other Brace/Splint: L short arm splint Restrictions Weight Bearing Restrictions: Yes LUE Weight Bearing: Weight bearing as tolerated (through forarm) LUE Partial Weight Bearing Percentage or Pounds: platform WB through forearm ok Pain: Pain Assessment Pain Assessment: No/denies pain  See Function Navigator for Current Functional Status.   Therapy/Group: Individual Therapy  [name] 04/18/2016, 2:46 PM

## 2016-04-18 NOTE — Progress Notes (Addendum)
Physical Therapy Weekly Progress Note  Patient Details  Name: [name] MRN: [id number] Date of Birth: 11/06/1930  Beginning of progress report period: April 10, 2016 End of progress report period: April 18, 2017  Today's Date: 04/18/2016 PT Individual Time: 0800-0930 PT Individual Time Calculation (min): 90 min   Patient has met 4 of 4 short term goals.  Patient has made excellent progress this reporting period and currently requires supervision overall for mobility using PFRW. Patient continues to be limited by significantly decreased activity tolerance and requires long rest breaks between mobility tasks. Daughters frequently present to observe and family training ongoing.   Patient continues to demonstrate the following deficits: muscle weakness, decreased cardiorespiratory endurance, decreased standing balance, decreased postural control and decreased balance strategies and therefore will continue to benefit from skilled PT intervention to enhance overall performance with activity tolerance, balance, postural control, ability to compensate for deficits and knowledge of precautions.  Patient progressing toward long term goals.  Continue plan of care.  PT Short Term Goals Week 1:  PT Short Term Goal 1 (Week 1): Patient will perform bed <> wheelchair transfers with consistent mod A.  PT Short Term Goal 1 - Progress (Week 1): Met PT Short Term Goal 2 (Week 1): Patient will ambulate 15 ft using LRAD with max A.  PT Short Term Goal 2 - Progress (Week 1): Met PT Short Term Goal 3 (Week 1): Patient will maintain dynamic standing balance x 1 min during functional task with mod A.  PT Short Term Goal 3 - Progress (Week 1): Met PT Short Term Goal 4 (Week 1): Paitent will initiate stair training.  PT Short Term Goal 4 - Progress (Week 1): Met Week 2:  PT Short Term Goal 1 (Week 2): = LTGs due to anticipated LOS  Skilled Therapeutic Interventions/Progress Updates:   Patient seated EOB, donned  TED hose and socks total A. Session focused on sit <> stand transfers with cues for anterior weight shift and eccentric control, gait using PFRW 2 x 150 ft + 50 ft with verbal cues for closer proximity to RW, static standing balance on foam wedge to facilitate balance strategies and B hamstring stretch with verbal cues for upright posture and pursed lip breathing 2 min x 2 trials with supervision, retrieving items from floor, stair training up/down 4 (6") stairs using R rail only, simulated car transfer to sedan height using PFRW with verbal cues for safe hand placement, wheelchair mobility using RUE only with mod A for steering, stand pivot transfers wheelchair <> outdoor bench without AD, and outdoor ambulation without AD x 20 ft + 30 ft. Patient required supervision overall for all functional mobility with prolonged seated rest breaks between tasks due to SOB and fatigue. Patient left sitting in recliner with all needs in reach.   Therapy Documentation Precautions:  Precautions Precautions: Fall, Other (comment) Precaution Comments: monitor vitals Required Braces or Orthoses: Other Brace/Splint Other Brace/Splint: L short arm splint Restrictions Weight Bearing Restrictions: Yes LUE Weight Bearing: Partial weight bearing LUE Partial Weight Bearing Percentage or Pounds: platform WB through forearm ok Pain: Pain Assessment Pain Assessment: No/denies pain   See Function Navigator for Current Functional Status.  Therapy/Group: Individual Therapy  Varner,  A 04/18/2016, 9:28 AM   
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
## 2016-04-19 ENCOUNTER — Inpatient Hospital Stay (HOSPITAL_COMMUNITY): Payer: Medicare Other | Admitting: Physical Therapy

## 2016-04-19 ENCOUNTER — Inpatient Hospital Stay (HOSPITAL_COMMUNITY): Payer: Medicare Other | Admitting: Speech Pathology

## 2016-04-19 ENCOUNTER — Inpatient Hospital Stay (HOSPITAL_COMMUNITY): Payer: Self-pay | Admitting: Physical Therapy

## 2016-04-19 ENCOUNTER — Inpatient Hospital Stay (HOSPITAL_COMMUNITY): Payer: Medicare Other | Admitting: Occupational Therapy

## 2016-04-19 LAB — URINE CULTURE: Culture: 7000 — AB

## 2016-04-19 MED ORDER — OXYBUTYNIN CHLORIDE 5 MG PO TABS
5.0000 mg | ORAL_TABLET | Freq: Two times a day (BID) | ORAL | Status: DC
  Administered 2016-04-19 – 2016-04-27 (×17): 5 mg via ORAL
  Filled 2016-04-19 (×17): qty 1

## 2016-04-19 NOTE — Progress Notes (Addendum)
Alert and oriented. Skin warm and very dry and scaly.  Reddened areas which are blanchable noted on mid spine and on coccyx.  Allevyn applied to both areas.  L. Arm in splint and ace wrap.  Able to move fingers slightly, but unable to extend fingers.  Capillary refill is adequate.  Ambulates to bathroom with cane and assist of 1 with OT.  Denies any pain.  Bed in lowest position, call bell in reach.  Florence Canner, RN

## 2016-04-19 NOTE — Progress Notes (Signed)
Physical Therapy Session Note  Patient Details  Name: KEYLEN DUFFANY MRN: [id number] Date of Birth: 1931/09/03  Today's Date: 04/19/2016 PT Individual Time: 0835-1000 PT Individual Time Calculation (min): 85 min   Short Term Goals: Week 2:  PT Short Term Goal 1 (Week 2): = LTGs due to anticipated LOS  Skilled Therapeutic Interventions/Progress Updates:   Patient in bed upon arrival. Session focused on discharge planning, functional transfers, ambulation, standing balance, and activity tolerance. Discussed patient initiating getting ready prior to scheduled therapy sessions and patient required cues for problem solving asking assistance from nursing staff as he states, "I can't do that by myself!" Extensive discussion regarding patient's barriers to DC home, which he reports using urinal with 1 hand is biggest barrier. Patient required total cues for problem solving use of BSC next to bed and total cues for carryover to apply to home environment. Gait training using PFRW x 150 ft, gait without AD x 90 ft, and trials of 2 x 90 ft + 50 ft + 150 ft using SPC with verbal/visual cues for sequencing 2-point gait pattern and improved stride length. Stair training up/down 4 (6") stairs using R rail only with supervision and step-to pattern. Engaged in game of horseshoes to challenge standing tolerance and dynamic balance with multidirectional reaching using RUE to obtain horseshoes with patient able to remain standing for 4 trials between 1 min 30 sec and 2 min with supervision. Patient required min verbal cues and extra time to recall score from each trial and add sums. Patient required rest breaks between mobility tasks due to SOB. Patient left sitting in recliner with all needs in reach.   Therapy Documentation Precautions:  Precautions Precautions: Fall, Other (comment) Precaution Comments: monitor vitals Required Braces or Orthoses: Other Brace/Splint Other Brace/Splint: L short arm  splint Restrictions Weight Bearing Restrictions: Yes LUE Weight Bearing: Weight bearing as tolerated LUE Partial Weight Bearing Percentage or Pounds: platform WB through forearm ok Pain: Pain Assessment Pain Assessment: No/denies pain Pain Score: 0-No pain  See Function Navigator for Current Functional Status.   Therapy/Group: Individual Therapy  Laretta Alstrom 04/19/2016, 10:10 AM

## 2016-04-19 NOTE — Progress Notes (Signed)
Occupational Therapy Session Note  Patient Details  Name: [name] MRN: [id number] Date of Birth: Dec 26, 1930  Today's Date: 04/19/2016 OT Individual Time: [id number] OT Individual Time Calculation (min): 120 min     Skilled Therapeutic Interventions/Progress Updates: Dtr who will stay with Mr. Coriano was present for most of therapy session today.    He and dtr participated in patient/family education as follows:  Dtr had many questions regarding: Dad's possible DC on 7/5 and therapy at home and purchase of tub transfer bench for Dad.   She stated she will call the case manager/social worker tomorrow to prep for Dad's possible discharge on 7/5.  Dtr stated that after discharge home, she hopes she will be able to leave her dad home alone for short periods occasionally while she runs to the store or to walk the dogs.    She stated she feels if he is instructed, he will comply and stay seated and not get off the chair or sofa while completing the aforementioned or similar tasks.  Patient and dtr wanted to know OT therapy goals; so, this clinician read over and explained OT goals.  Patient participated in meds and lying, sitting, standing BP per machine by RN during session.  BP within safe range and no complaints of dizziness eithr position.  Dtr demonstrated safely supervising Dad's walk via straight cane bed to/fr toilet with close S;   Patient completed toileitng with close S by dtr  Dtr and dad completed w/c to therapy room tub/shower transfer via straight cane with close S.  Patient was issued a long handled sponge in order to wash his back.   Also, he stated he has a long handled hose at home,  He required asisstance to open cranberry juice with aluminum foil top which requires two hands.  With demonstrative and verbal cues he was able to return demonstation for getting plastic lid off his blueberry cobbler bowl - though it took extra time for him to demonstrate right hand, one handed  coordination & dexterity skills.  With more opportunities to practice opening containers, he will quickly be more independent with these.  Patient had no complaints of dizziness today.   He complained that he was very tired by 4 this afternoon, and he attributed it to having difficultry sleeping the last two nights.  He stated he will ask the nurse for sleep meds if he has difficulty sleeping tonight.     Therapy Documentation Precautions:  Precautions Precautions: Fall, Other (comment) Precaution Comments: monitor vitals Required Braces or Orthoses: Other Brace/Splint Other Brace/Splint: L short arm splint Restrictions Weight Bearing Restrictions: Yes LUE Weight Bearing: Weight bearing as tolerated LUE Partial Weight Bearing Percentage or Pounds: platform WB through forearm ok  Pain:denied    See Function Navigator for Current Functional Status.   Therapy/Group: Individual Therapy  Alfredia Ferguson [facility] 04/19/2016, 7:10 PM
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
## 2016-04-19 NOTE — Progress Notes (Signed)
Speech Language Pathology Daily Session Note  Patient Details  Name: [name] MRN: [id number] Date of Birth: 1931-02-26  Today's Date: 04/19/2016 SLP Individual Time: 1100-1202 SLP Individual Time Calculation (min): 62 min  Short Term Goals: Week 2: SLP Short Term Goal 1 (Week 2): STG=LTG due to length of stay  Skilled Therapeutic Interventions: Pt seen for cognitive-linguistic therapy. Pt able to recall plan for today's session which was established yesterday. He did not read the agreed upon article in preparation for today's session. Money counting was performed with 90% acc at mod I due to increased time. Deductive reasoning puzzle required min- mod A secondary to limitations with reasoning and working memory. Pt was able to verbalize that the task was difficult for him and he seemed surprised by his limitations. Discussed functional daily tasks that requires use of these cognitive functions. Pt and daughter both verbalized understanding. Immediate recall of multiple paragraph level material- pt able to generally summarize material, but unable to provide accurate detail.    Function:  Eating Eating                 Cognition Comprehension Comprehension assist level: Understands complex 90% of the time/cues 10% of the time  Expression   Expression assist level: Expresses complex 90% of the time/cues < 10% of the time  Social Interaction Social Interaction assist level: Interacts appropriately with others with medication or extra time (anti-anxiety, antidepressant).  Problem Solving Problem solving assist level: Solves basic problems with no assist  Memory Memory assist level: Recognizes or recalls 90% of the time/requires cueing < 10% of the time    Pain Pain Assessment Pain Assessment: No/denies pain Pain Score: 0-No pain  Therapy/Group: Individual Therapy  Vinetta Bergamo MA, CCC-SLP 04/19/2016, 12:21 PM

## 2016-04-20 ENCOUNTER — Inpatient Hospital Stay (HOSPITAL_COMMUNITY): Payer: Medicare Other | Admitting: Physical Therapy

## 2016-04-20 ENCOUNTER — Inpatient Hospital Stay (HOSPITAL_COMMUNITY): Payer: BLUE CROSS/BLUE SHIELD

## 2016-04-20 ENCOUNTER — Inpatient Hospital Stay (HOSPITAL_COMMUNITY): Payer: BLUE CROSS/BLUE SHIELD | Admitting: Speech Pathology
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
## 2016-04-20 NOTE — Progress Notes (Signed)
Occupational Therapy Session Note  Patient Details  Name: AIDEN SCHARPF MRN: [id number] Date of Birth: 10-23-1930  Today's Date: 04/20/2016 OT Individual Time: 0800-0859 OT Individual Time Calculation (min): 59 min    Short Term Goals: Week 2:  OT Short Term Goal 1 (Week 2): STG=LTG secondary to ELOS  Skilled Therapeutic Interventions/Progress Updates:    Pt resting in w/c upon arrival.  Pt engaged in BADL retraining including bathing and dressing with sit<>stand from w/c at sink.  Pt continues to require min verbal cues for initiation and encouragement that he can complete some tasks that he initially states he is unable to complete.  Discussed use of BSC at night instead of use of urinal (which requires assistance). Pt required assistance with donning [facility].  Pt requires more than a reasonable amount of time to complete all tasks with multiple rest breaks.  Pt also engaged in functional amb with SPC in room for simple home mgmt tasks.   Therapy Documentation Precautions:  Precautions Precautions: Fall, Other (comment) Precaution Comments: monitor vitals Required Braces or Orthoses: Other Brace/Splint Other Brace/Splint: L short arm splint Restrictions Weight Bearing Restrictions: Yes LUE Weight Bearing: Weight bearing as tolerated (through forarm) LUE Partial Weight Bearing Percentage or Pounds: platform WB through forearm ok Pain:  low back pain, unrated, discomfort, repostioned  See Function Navigator for Current Functional Status.   Therapy/Group: Individual Therapy  [name] 04/20/2016, 9:01 AM

## 2016-04-20 NOTE — Progress Notes (Signed)
Physical Therapy Session Note  Patient Details  Name: [name] MRN: [id number] Date of Birth: [birth year]  Today's Date: 04/20/2016 PT Individual Time: 1305-1420 PT Individual Time Calculation (min): 75 min   Short Term Goals: Week 2:  PT Short Term Goal 1 (Week 2): = LTGs due to anticipated LOS  Skilled Therapeutic Interventions/Progress Updates:   Patient sitting in recliner upon arrival. Session focused on sit <> stand transfers from recliner x 1 with min A after sitting x 3 hours and from arm chairs/other surfaces with supervision overall and cues for anterior weight shift, gait training using SPC 2 x 150 ft + 2 x 50 ft with close supervision, stair training up/down 8 (6") stairs using R rail only with supervision demonstrating improved endurance as patient typically only able to do 4 stairs at a time before resting, standing balance/standing tolerance on foam pad while engaging in tabletop card game with min multimodal cues for adhering to rules/basic math skills and steady assist for standing balance, and NuStep using RUE and BLE at level 5, 2 trials of x 5 min each for strengthening, endurance, and reciprocal pattern re-training. Patient continues to require frequent rest breaks between tasks due to SOB. Patient left sitting in wheelchair with RN present.   Therapy Documentation Precautions:  Precautions Precautions: Fall, Other (comment) Precaution Comments: monitor vitals Required Braces or Orthoses: Other Brace/Splint Other Brace/Splint: L short arm splint Restrictions Weight Bearing Restrictions: Yes LUE Weight Bearing: Weight bear through elbow only LUE Partial Weight Bearing Percentage or Pounds: platform WB through forearm ok Pain: Pain Assessment Pain Assessment: No/denies pain  See Function Navigator for Current Functional Status.   Therapy/Group: Individual Therapy  Laretta Alstrom 04/20/2016, 2:23 PM
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
## 2016-04-20 NOTE — Progress Notes (Signed)
Speech Language Pathology Daily Session Note  Patient Details  Name: [name] MRN: [id number] Date of Birth: [birth year]  Today's Date: 04/20/2016 SLP Individual Time: 0900-1000 SLP Individual Time Calculation (min): 60 min  Short Term Goals: Week 2  Skilled Therapeutic Interventions: Pt seen for cognitive-linguistic therapy. Pt able to recall plan for today's session which was established yesterday. Again he did not read the agreed upon article in preparation for today's session. Discussed taking notes while reading to increase pleasure (with improved comprehension). Pt verbalized that prior to today, he was unaware he had sustained a TBI and had any cognitive impairment. Deductive reasoning puzzle required mod A secondary to limitations with reasoning and working memory. Further discussion this date re: compensatory strategies for working memory such as utilizing written language and taking mental rest breaks. Discussed functional daily tasks that requires use of these cognitive functions.   Function:  Eating Eating                 Cognition Comprehension Comprehension assist level: Understands complex 90% of the time/cues 10% of the time  Expression   Expression assist level: Expresses complex 90% of the time/cues < 10% of the time  Social Interaction Social Interaction assist level: Interacts appropriately with others with medication or extra time (anti-anxiety, antidepressant).  Problem Solving Problem solving assist level: Solves basic problems with no assist  Memory Memory assist level: Recognizes or recalls 75 - 89% of the time/requires cueing 10 - 24% of the time    Pain Pain Assessment Pain Assessment: No/denies pain  Therapy/Group: Individual Therapy  Vinetta Bergamo MA, CCC-SLP 04/20/2016, 12:30 PM

## 2016-04-21 ENCOUNTER — Inpatient Hospital Stay (HOSPITAL_COMMUNITY): Payer: BLUE CROSS/BLUE SHIELD
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
## 2016-04-21 NOTE — Progress Notes (Signed)
Occupational Therapy Session Note  Patient Details  Name: JOSNIEL EKBERG MRN: [id number] Date of Birth: 1931/05/15  Today's Date: 04/21/2016 OT Individual Time: 1000-1100 OT Individual Time Calculation (min): 60 min    Short Term Goals: Week 2:  OT Short Term Goal 1 (Week 2): STG=LTG secondary to ELOS  Skilled Therapeutic Interventions/Progress Updates:    Pt resting in bed upon arrival with daughters present.  Pt declined bathing and dressing this morning.  Pt amb with SPC to ADL apartment and practiced sit<>stand from sofa/couch to simulate seating at home.  Pt initially required min A for sit<>stand during first attempt.  Subsequent attempts pt completed at supervision level.  During last attempt pt became slightly light headed and sat back down. BP 102/52. Recommended use of BSC during night time for safety reasons.  Discussed home safety with daughters.  Recommended 24/7 supervision and daughters verbalized understanding.  Pt returned to room via w/c and transferred to recliner.  Pt remained in recliner with daughters present and all needs within reach.   Therapy Documentation Precautions:  Precautions Precautions: Fall, Other (comment) Precaution Comments: monitor vitals Required Braces or Orthoses: Other Brace/Splint Other Brace/Splint: L short arm splint Restrictions Weight Bearing Restrictions: Yes LUE Weight Bearing: Weight bearing as tolerated (through elbow) LUE Partial Weight Bearing Percentage or Pounds: platform WB through forearm ok Pain: Pain Assessment Pain Assessment: No/denies pain  See Function Navigator for Current Functional Status.   Therapy/Group: Individual Therapy  [name] 04/21/2016, 11:08 AM

## 2016-04-22 ENCOUNTER — Inpatient Hospital Stay (HOSPITAL_COMMUNITY): Payer: Medicare Other | Admitting: Physical Therapy

## 2016-04-22 ENCOUNTER — Inpatient Hospital Stay (HOSPITAL_COMMUNITY): Payer: Medicare Other

## 2016-04-22 MED ORDER — CEFAZOLIN SODIUM-DEXTROSE 2-4 GM/100ML-% IV SOLN: 2.0000 g | INTRAVENOUS | Status: DC
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
## 2016-04-23 ENCOUNTER — Inpatient Hospital Stay (HOSPITAL_COMMUNITY): Payer: Medicare Other | Admitting: Certified Registered"

## 2016-04-23 ENCOUNTER — Inpatient Hospital Stay (HOSPITAL_COMMUNITY): Payer: Medicare Other | Admitting: Speech Pathology

## 2016-04-23 ENCOUNTER — Inpatient Hospital Stay (HOSPITAL_COMMUNITY): Payer: BLUE CROSS/BLUE SHIELD

## 2016-04-23 ENCOUNTER — Encounter (HOSPITAL_COMMUNITY)
Admission: RE | Disposition: A | Discharge status: Home Health | Payer: Self-pay | Source: Intra-hospital | Attending: Physical Medicine & Rehabilitation

## 2016-04-23 ENCOUNTER — Inpatient Hospital Stay (HOSPITAL_COMMUNITY): Admission: RE | Admit: 2016-04-23 | Payer: Medicare Other | Source: Ambulatory Visit | Admitting: Orthopaedic Surgery

## 2016-04-23 HISTORY — PX: I & D EXTREMITY: SHX5045

## 2016-04-23 HISTORY — PX: OPEN REDUCTION INTERNAL FIXATION (ORIF) METACARPAL: SHX6234

## 2016-04-23 LAB — POCT I-STAT 4, (NA,K, GLUC, HGB,HCT)
Glucose, Bld: 91 mg/dL (ref 65–99)
HCT: 34 % — ABNORMAL LOW (ref 39.0–52.0)
HEMOGLOBIN: 11.6 g/dL — AB (ref 13.0–17.0)
POTASSIUM: 4.1 mmol/L (ref 3.5–5.1)
Sodium: 127 mmol/L — ABNORMAL LOW (ref 135–145)

## 2016-04-23 SURGERY — OPEN REDUCTION INTERNAL FIXATION (ORIF) METACARPAL
Anesthesia: Monitor Anesthesia Care | Site: Hand | Laterality: Left

## 2016-04-23 MED ORDER — LIDOCAINE 2% (20 MG/ML) 5 ML SYRINGE
INTRAMUSCULAR | Status: AC
  Filled 2016-04-23: qty 5

## 2016-04-23 MED ORDER — ONDANSETRON HCL 4 MG/2ML IJ SOLN
INTRAMUSCULAR | Status: AC
  Filled 2016-04-23: qty 2

## 2016-04-23 MED ORDER — FENTANYL CITRATE (PF) 100 MCG/2ML IJ SOLN: 25.0000 ug | INTRAMUSCULAR | Status: DC | PRN

## 2016-04-23 MED ORDER — ONDANSETRON HCL 4 MG/2ML IJ SOLN
INTRAMUSCULAR | Status: DC | PRN
  Administered 2016-04-23: 4 mg via INTRAVENOUS

## 2016-04-23 MED ORDER — SUGAMMADEX SODIUM 200 MG/2ML IV SOLN
INTRAVENOUS | Status: AC
  Filled 2016-04-23: qty 4

## 2016-04-23 MED ORDER — ROPIVACAINE HCL 7.5 MG/ML IJ SOLN
INTRAMUSCULAR | Status: DC | PRN
  Administered 2016-04-23: 20 mL via PERINEURAL

## 2016-04-23 MED ORDER — MEPERIDINE HCL 25 MG/ML IJ SOLN: 6.2500 mg | INTRAMUSCULAR | Status: DC | PRN

## 2016-04-23 MED ORDER — PHENYLEPHRINE HCL 10 MG/ML IJ SOLN
10.0000 mg | INTRAVENOUS | Status: DC | PRN
  Administered 2016-04-23: 20 ug/min via INTRAVENOUS

## 2016-04-23 MED ORDER — PROPOFOL 500 MG/50ML IV EMUL
INTRAVENOUS | Status: DC | PRN
  Administered 2016-04-23: 150 ug/kg/min via INTRAVENOUS

## 2016-04-23 MED ORDER — PROPOFOL 10 MG/ML IV BOLUS
INTRAVENOUS | Status: AC
  Filled 2016-04-23: qty 20

## 2016-04-23 MED ORDER — LACTATED RINGERS IV SOLN
INTRAVENOUS | Status: DC
  Administered 2016-04-23: 12:00:00 via INTRAVENOUS

## 2016-04-23 MED ORDER — CEFAZOLIN SODIUM-DEXTROSE 2-3 GM-% IV SOLR
INTRAVENOUS | Status: DC | PRN
  Administered 2016-04-23: 2 g via INTRAVENOUS

## 2016-04-23 MED ORDER — LIDOCAINE-EPINEPHRINE (PF) 1.5 %-1:200000 IJ SOLN
INTRAMUSCULAR | Status: DC | PRN
  Administered 2016-04-23: 10 mL via PERINEURAL

## 2016-04-23 MED ORDER — FENTANYL CITRATE (PF) 100 MCG/2ML IJ SOLN
50.0000 ug | Freq: Once | INTRAMUSCULAR | Status: AC
  Administered 2016-04-23: 50 ug via INTRAVENOUS

## 2016-04-23 MED ORDER — EPHEDRINE SULFATE 50 MG/ML IJ SOLN
INTRAMUSCULAR | Status: DC | PRN
  Administered 2016-04-23 (×3): 5 mg via INTRAVENOUS
  Administered 2016-04-23: 15 mg via INTRAVENOUS

## 2016-04-23 MED ORDER — ROCURONIUM BROMIDE 50 MG/5ML IV SOLN
INTRAVENOUS | Status: AC
  Filled 2016-04-23: qty 1

## 2016-04-23 MED ORDER — SODIUM CHLORIDE 0.9 % IR SOLN
Status: DC | PRN
  Administered 2016-04-23: 3000 mL

## 2016-04-23 MED ORDER — LACTATED RINGERS IV SOLN: INTRAVENOUS | Status: DC

## 2016-04-23 MED ORDER — PROMETHAZINE HCL 25 MG/ML IJ SOLN: 6.2500 mg | INTRAMUSCULAR | Status: DC | PRN

## 2016-04-23 MED ORDER — 0.9 % SODIUM CHLORIDE (POUR BTL) OPTIME
TOPICAL | Status: DC | PRN
  Administered 2016-04-23: 1000 mL

## 2016-04-23 MED ORDER — FENTANYL CITRATE (PF) 250 MCG/5ML IJ SOLN
INTRAMUSCULAR | Status: AC
  Filled 2016-04-23: qty 5

## 2016-04-23 SURGICAL SUPPLY — 58 items
BANDAGE ACE 3X5.8 VEL STRL LF (GAUZE/BANDAGES/DRESSINGS) ×1 IMPLANT
BANDAGE ACE 4X5 VEL STRL LF (GAUZE/BANDAGES/DRESSINGS) ×1 IMPLANT
BANDAGE ELASTIC 3 VELCRO ST LF (GAUZE/BANDAGES/DRESSINGS) ×3 IMPLANT
BANDAGE ELASTIC 4 VELCRO ST LF (GAUZE/BANDAGES/DRESSINGS) IMPLANT
BIT DRILL 2X3.5 HAND QK RELEAS (BIT) IMPLANT
BIT DRILL 2X3.5 QUICK RELEASE (BIT) ×3
BLADE SURG ROTATE 9660 (MISCELLANEOUS) IMPLANT
BNDG GAUZE ELAST 4 BULKY (GAUZE/BANDAGES/DRESSINGS) ×3 IMPLANT
CORDS BIPOLAR (ELECTRODE) ×1 IMPLANT
COVER SURGICAL LIGHT HANDLE (MISCELLANEOUS) ×3 IMPLANT
CUFF TOURNIQUET SINGLE 18IN (TOURNIQUET CUFF) ×1 IMPLANT
ELECT CAUTERY BLADE 6.4 (BLADE) ×3 IMPLANT
ELECT REM PT RETURN 9FT ADLT (ELECTROSURGICAL) ×3
ELECTRODE REM PT RTRN 9FT ADLT (ELECTROSURGICAL) IMPLANT
GAUZE SPONGE 4X4 12PLY STRL (GAUZE/BANDAGES/DRESSINGS) ×5 IMPLANT
GAUZE XEROFORM 1X8 LF (GAUZE/BANDAGES/DRESSINGS) ×3 IMPLANT
GAUZE XEROFORM 5X9 LF (GAUZE/BANDAGES/DRESSINGS) ×3 IMPLANT
GLOVE SKINSENSE NS SZ7.5 (GLOVE) ×1
GLOVE SKINSENSE STRL SZ7.5 (GLOVE) ×4 IMPLANT
GLOVE SS BIOGEL STRL SZ 7.5 (GLOVE) ×4 IMPLANT
GLOVE SUPERSENSE BIOGEL SZ 7.5 (GLOVE) ×2
GLOVE SURG SS PI 6.0 STRL IVOR (GLOVE) ×1 IMPLANT
GOWN STRL REIN XL XLG (GOWN DISPOSABLE) ×7 IMPLANT
KIT BASIN OR (CUSTOM PROCEDURE TRAY) ×3 IMPLANT
KIT ROOM TURNOVER OR (KITS) ×3 IMPLANT
MANIFOLD NEPTUNE II (INSTRUMENTS) ×3 IMPLANT
NS IRRIG 1000ML POUR BTL (IV SOLUTION) ×5 IMPLANT
PACK ORTHO EXTREMITY (CUSTOM PROCEDURE TRAY) ×3 IMPLANT
PAD ARMBOARD 7.5X6 YLW CONV (MISCELLANEOUS) ×5 IMPLANT
PAD CAST 4YDX4 CTTN HI CHSV (CAST SUPPLIES) IMPLANT
PADDING CAST ABS 4INX4YD NS (CAST SUPPLIES) ×2
PADDING CAST ABS COTTON 4X4 ST (CAST SUPPLIES) ×4 IMPLANT
PADDING CAST COTTON 4X4 STRL (CAST SUPPLIES) ×3
PLATE 1.3 T-SHAPED (Plate) ×1 IMPLANT
SCREW BONE LAG 2.3X16MM HEXA (Screw) IMPLANT
SCREW BONE LOCK 2.3X14MM HEXA (Screw) IMPLANT
SCREW BONE NL 2.3X14MM HEXA (Screw) IMPLANT
SCREW LAG 2.3X16 (Screw) ×3 IMPLANT
SCREW LOCK 2.3X14 (Screw) ×3 IMPLANT
SCREW LOCK HEX MULTI 2.3X11 (Screw) ×3 IMPLANT
SCREW LOCK HEX MULTI 2.3X7 (Screw) ×1 IMPLANT
SCREW NON LOCK 2.3X14 (Screw) ×3 IMPLANT
SCREW NONLOCK TI 2.3X11 (Screw) ×4 IMPLANT
SPLINT FIBERGLASS 3X12 (CAST SUPPLIES) ×1 IMPLANT
SPONGE GAUZE 4X4 12PLY STER LF (GAUZE/BANDAGES/DRESSINGS) ×1 IMPLANT
SUT ETHILON 3 0 PS 1 (SUTURE) ×1 IMPLANT
SUT FIBERWIRE #2 38 T-5 BLUE (SUTURE) ×3
SUT FIBERWIRE 2-0 18 17.9 3/8 (SUTURE) ×6
SUT PROLENE 4 0 P 3 18 (SUTURE) IMPLANT
SUT VIC AB 3-0 FS2 27 (SUTURE) ×1 IMPLANT
SUTURE FIBERWR #2 38 T-5 BLUE (SUTURE) IMPLANT
SUTURE FIBERWR 2-0 18 17.9 3/8 (SUTURE) IMPLANT
TOWEL OR 17X24 6PK STRL BLUE (TOWEL DISPOSABLE) ×3 IMPLANT
TOWEL OR 17X26 10 PK STRL BLUE (TOWEL DISPOSABLE) ×3 IMPLANT
TUBE CONNECTING 12X1/4 (SUCTIONS) ×3 IMPLANT
TUBING CYSTO DISP (UROLOGICAL SUPPLIES) ×3 IMPLANT
UNDERPAD 30X30 INCONTINENT (UNDERPADS AND DIAPERS) ×5 IMPLANT
YANKAUER SUCT BULB TIP NO VENT (SUCTIONS) ×3 IMPLANT

## 2016-04-23 NOTE — H&P (Signed)

## 2016-04-23 NOTE — Progress Notes (Signed)
Occupational Therapy Note  Patient Details  Name: [name] MRN: [id number] Date of Birth: 01-10-1931  Today's Date: 04/23/2016 OT Missed Time: 90 Minutes Missed Time Reason: Unavailable (comment);Other (comment);Nursing care (pt awaiting transport for surgical procedure)  Pt missed 90 mins skilled OT services.  Pt had just received CHG bath and was awaiting transport for surgical procedure.  Pt declined therapy with OT until tomorrow.  Pt stated he was ready for his shower tomorrow and hoped the procedure would not delay his discharge on 7/5.   [name] [facility] 04/23/2016, 9:17 AM
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
## 2016-04-23 NOTE — Op Note (Signed)
   Date of Surgery: 04/23/2016  INDICATIONS: [name] is a 82 y.o.-year-old male who underwent irrigation debridement for open metacarpal fractures and fixation approximately 2 weeks ago who returns today for repeat washout given nonhealing of the traumatic wound and interval loss of fixation of his second metacarpal fracture.;  The patient did consent to the procedure after discussion of the risks and benefits.  PREOPERATIVE DIAGNOSIS: Left hand wound from traumatic open fracture and loss of fixation of second metacarpal fracture  POSTOPERATIVE DIAGNOSIS: Same.  PROCEDURE: 1. Irrigation and debridement of left second metacarpal fracture including bone, skin associated with open fracture 2. Revision Open reduction internal fixation of left second metacarpal fracture with plate 3. Adjacent tissue rearrangement left hand 3 cm  SURGEON: N. Eduard Roux, M.D.  ASSIST: None.  ANESTHESIA:  general  IV FLUIDS AND URINE: See anesthesia.  ESTIMATED BLOOD LOSS: Minimal mL.  IMPLANTS: Acumed  DRAINS: None  COMPLICATIONS: None.  DESCRIPTION OF PROCEDURE: The patient was brought to the operating room and placed supine on the operating table.  The patient had been signed prior to the procedure and this was documented. The patient had the anesthesia placed by the anesthesiologist.  A time-out was performed to confirm that this was the correct patient, site, side and location. The patient did receive antibiotics prior to the incision and was re-dosed during the procedure as needed at indicated intervals.  A tourniquet was placed.  The patient had the operative extremity prepped and draped in the standard surgical fashion.    I made a longitudinal incision over the second metacarpal ellipsing out the draining wound. Full-thickness flaps were created. These were elevated off of the metacarpal. The extensor tendon was visualized and mobilized. The fracture was visualized and showed that it had lost fixation  from the previous screws. The screws were sequentially backed out using a screwdriver. I then performed sharp excisional debridement of bone, skin associated with open fracture with Rongeur and knife. This was then thoroughly irrigated with 3 L of normal saline. I then turned my attention to internal fixation of the metacarpal fracture. There was significant comminution of the second metacarpal. There was a sagittal split down the axis of the metacarpal. I was able to gain provisional reduction with a clamp. I then placed the appropriate plate on the radial aspect of the metacarpal at the appropriate position. I used a series of locking and nonlocking screws to achieve fixation of the fracture and the plate to the bone. The bone quality was poor and I did have to use locking screws and most of the holes. I then used a #2 fiber wire to cerclage the entire metacarpal. Fluoroscopy was used to confirm reduction and appropriate plate placement. The wound was then thoroughly irrigated again.  I had to perform adjacent tissue rearrangement of the left hand in order to close the wound primarily using 3-0 Vicryl and 4-0 nylon. Sterile dressings were applied. The hand was immobilized in a dorsal blocking splint. The patient tolerated the procedure well and no immediate complications.  POSTOPERATIVE PLAN: The patient will be platform weightbearing to the left upper extremity.  From my standpoint the patient can be discharged as previously determined by the primary team.  Azucena Cecil, MD [place] [phone] 6:15 PM

## 2016-04-23 NOTE — Progress Notes (Signed)
Alert and oriented x4.  Tolerated surgical procedure on L arm without incident.  Returned to room around 3 pm.  L arm dressing dry and intact with some bloody drainage noted on end of gauze on L. Hand. Drainage line marked.  L. Hand and arm elevated on pillow.  Fingers are slightly flexed, but much more open than prior to surgery. Capillary refill is brisk and fingers are warm to touch.  Arm remains numb from nerve block up to elbow at this time.  Instructed patient to take deep breaths every time a commercial comes on TV and to use incentive spirometry. Denies any discomfort.

## 2016-04-23 NOTE — Anesthesia Preprocedure Evaluation (Addendum)
Anesthesia Evaluation  Patient identified by MRN, date of birth, ID band Patient awake    Reviewed: Allergy & Precautions, H&P , NPO status , Patient's Chart, lab work & pertinent test results, reviewed documented beta blocker date and time   History of Anesthesia Complications Negative for: history of anesthetic complications  Airway Mallampati: II  TM Distance: >3 FB Neck ROM: full    Dental no notable dental hx.    Pulmonary COPD,  COPD inhaler, former smoker,    Pulmonary exam normal breath sounds clear to auscultation       Cardiovascular hypertension, Pt. on medications and Pt. on home beta blockers + CAD, + Past MI, + Cardiac Stents, + Peripheral Vascular Disease and +CHF  Normal cardiovascular exam+ dysrhythmias Ventricular Tachycardia + pacemaker + Cardiac Defibrillator  Rhythm:regular Rate:Normal  Echo 6/17 with moderate decreased EF at 35%, no significant valve abnormalities  Cath 2017 with chronically occluded left circ artery, patent LAD stent, has cardiac clearance for ORIF of left hand   Neuro/Psych negative neurological ROS  negative psych ROS   GI/Hepatic negative GI ROS, Neg liver ROS,   Endo/Other  Hypothyroidism   Renal/GU negative Renal ROS  negative genitourinary   Musculoskeletal  (+) Arthritis ,   Abdominal   Peds negative pediatric ROS (+)  Hematology negative hematology ROS (+) anemia ,   Anesthesia Other Findings   Reproductive/Obstetrics negative OB ROS                            Anesthesia Physical  Anesthesia Plan  ASA: III  Anesthesia Plan: MAC and Regional   Post-op Pain Management:    Induction:   Airway Management Planned: Natural Airway, Nasal Cannula and Simple Face Mask  Additional Equipment: None  Intra-op Plan:   Post-operative Plan:   Informed Consent: I have reviewed the patients History and Physical, chart, labs and discussed the  procedure including the risks, benefits and alternatives for the proposed anesthesia with the patient or authorized representative who has indicated his/her understanding and acceptance.   Dental Advisory Given  Plan Discussed with: Anesthesiologist, CRNA and Surgeon  Anesthesia Plan Comments:        Anesthesia Quick Evaluation

## 2016-04-23 NOTE — Anesthesia Procedure Notes (Addendum)
Procedure Name: MAC Date/Time: 04/23/2016 12:02 PM Performed by: Layla Maw Pre-anesthesia Checklist: Patient identified, Patient being monitored, Timeout performed, Emergency Drugs available and Suction available Patient Re-evaluated:Patient Re-evaluated prior to inductionOxygen Delivery Method: Simple face mask and Nasal cannula Preoxygenation: Pre-oxygenation with 100% oxygen Number of attempts: 1 Placement Confirmation: positive ETCO2 Dental Injury: Teeth and Oropharynx as per pre-operative assessment    Anesthesia Regional Block:  Axillary brachial plexus block  Pre-Anesthetic Checklist: ,, timeout performed, Correct Patient, Correct Site, Correct Laterality, Correct Procedure, Correct Position, site marked, Risks and benefits discussed,  Surgical consent,  Pre-op evaluation,  At surgeon's request and post-op pain management  Laterality: Left and Upper  Prep: chloraprep       Needles:  Injection technique: Single-shot  Needle Type: Echogenic Stimulator Needle          Additional Needles:  Procedures: ultrasound guided (picture in chart) Axillary brachial plexus block Narrative:  Injection made incrementally with aspirations every 5 mL.  Performed by: Personally  Anesthesiologist: Singleton Hickox  Additional Notes: H+P and labs reviewed, risks and benefits discussed with patient, procedure tolerated well without complications

## 2016-04-23 NOTE — Transfer of Care (Signed)
Immediate Anesthesia Transfer of Care Note  Patient: [name]  Procedure(s) Performed: Procedure(s): REVISION OPEN REDUCTION INTERNAL FIXATION (ORIF) 2ND METACARPAL (Left) IRRIGATION AND DEBRIDEMENT HAND (Left)  Patient Location: PACU  Anesthesia Type:MAC combined with regional for post-op pain  Level of Consciousness: sedated  Airway & Oxygen Therapy: Patient Spontanous Breathing and Patient connected to nasal cannula oxygen  Post-op Assessment: Report given to RN and Post -op Vital signs reviewed and stable  Post vital signs: Reviewed and stable  Last Vitals:  Filed Vitals:   04/23/16 1150 04/23/16 1155  BP: 166/69   Pulse: 60 60  Temp:    Resp: 23 19    Last Pain:  Filed Vitals:   04/23/16 1159  PainSc: 4       Patients Stated Pain Goal: 2 ([id number])  Complications: No apparent anesthesia complications

## 2016-04-23 NOTE — Progress Notes (Signed)
Speech Language Pathology Daily Session Note  Patient Details  Name: [name] MRN: [id number] Date of Birth: [birth year]  Today's Date: 04/23/2016 SLP Individual Time: 1000-1100 SLP Individual Time Calculation (min): 60 min  Short Term Goals: Week 2: SLP Short Term Goal 1 (Week 2): STG=LTG due to length of stay  Skilled Therapeutic Interventions: Pt seen for cognitive-linguistic therapy.  Discussed taking notes while reading to increase pleasure (with improved comprehension) with pt's daughter.  Further discussion this date with pt's daughter and pt re: compensatory strategies for working memory such as utilizing written language and taking mental rest breaks. Again discussed functional daily tasks that requires use of these cognitive functions. Pt required mod A for completion of functional math word problems due to limitations with working memory.   Function:  Eating Eating                 Cognition Comprehension Comprehension assist level: Understands complex 90% of the time/cues 10% of the time  Expression   Expression assist level: Expresses complex 90% of the time/cues < 10% of the time  Social Interaction Social Interaction assist level: Interacts appropriately 90% of the time - Needs monitoring or encouragement for participation or interaction.  Problem Solving Problem solving assist level: Solves basic problems with no assist  Memory Memory assist level: Recognizes or recalls 75 - 89% of the time/requires cueing 10 - 24% of the time    Pain Pain Assessment Pain Assessment: No/denies pain Pain Score: Asleep  Therapy/Group: Individual Therapy  Vinetta Bergamo MA, CCC-SLP 04/23/2016, 3:11 PM

## 2016-04-23 NOTE — OR Nursing (Signed)
Late entry on 04-23-16 by Etheleen Mayhew, RN to correct item numbers on implants provided by rep.

## 2016-04-23 NOTE — Anesthesia Postprocedure Evaluation (Signed)
Anesthesia Post Note  Patient: [name]  Procedure(s) Performed: Procedure(s) (LRB): REVISION OPEN REDUCTION INTERNAL FIXATION (ORIF) 2ND METACARPAL (Left) IRRIGATION AND DEBRIDEMENT HAND (Left)  Patient location during evaluation: PACU Anesthesia Type: MAC and Regional Level of consciousness: awake Pain management: pain level controlled Vital Signs Assessment: post-procedure vital signs reviewed and stable Respiratory status: spontaneous breathing Cardiovascular status: stable Postop Assessment: no signs of nausea or vomiting Anesthetic complications: no    Last Vitals:  Filed Vitals:   04/23/16 1426 04/23/16 1448  BP: 135/59 133/62  Pulse: 59 59  Temp:  36.4 C  Resp: 16 16    Last Pain:  Filed Vitals:   04/23/16 1449  PainSc: Asleep                 Aamori Mcmasters

## 2016-04-24 ENCOUNTER — Inpatient Hospital Stay (HOSPITAL_COMMUNITY): Payer: BLUE CROSS/BLUE SHIELD

## 2016-04-24 ENCOUNTER — Inpatient Hospital Stay (HOSPITAL_COMMUNITY): Payer: Medicare Other | Admitting: Physical Therapy

## 2016-04-24 ENCOUNTER — Inpatient Hospital Stay (HOSPITAL_COMMUNITY): Payer: Medicare Other | Admitting: Speech Pathology

## 2016-04-24 NOTE — Progress Notes (Signed)
Speech Language Pathology Daily Session Note  Patient Details  Name: IKER HYLAND MRN: [id number] Date of Birth: 11-25-1930  Today's Date: 04/24/2016 SLP Individual Time: 1300-1330 SLP Individual Time Calculation (min): 30 min  Short Term Goals: Week 2: SLP Short Term Goal 1 (Week 2): STG=LTG due to length of stay  Skilled Therapeutic Interventions: Pt seen for cognitive-linguistic therapy.  Pt reports feeling good about discharge date on Friday as he understands the merit in ensuring safe mobility prior to discharge. Pt able to attend to complex reading and auditory comprehension tasks with improved performance noted with use of strategy of breaking material down into smaller segments and summarizing as he progressed. Pt was able to draw inferences at mod I for increased time.   Function:  Eating Eating                 Cognition Comprehension Comprehension assist level: Understands complex 90% of the time/cues 10% of the time  Expression   Expression assist level: Expresses complex 90% of the time/cues < 10% of the time  Social Interaction Social Interaction assist level: Interacts appropriately 90% of the time - Needs monitoring or encouragement for participation or interaction.  Problem Solving Problem solving assist level: Solves basic problems with no assist  Memory Memory assist level: Recognizes or recalls 75 - 89% of the time/requires cueing 10 - 24% of the time    Pain Pain Assessment Pain Assessment: No/denies pain  Therapy/Group: Individual Therapy  Vinetta Bergamo MA, CCC-SLP 04/24/2016, 3:35 PM

## 2016-04-24 NOTE — Progress Notes (Signed)
Occupational Therapy Session Note  Patient Details  Name: [name] MRN: [id number] Date of Birth: Oct 25, 1930  Today's Date: 04/24/2016 OT Individual Time: 1000-1125 OT Individual Time Calculation (min): 85 min    Short Term Goals: Week 2:  OT Short Term Goal 1 (Week 2): STG=LTG secondary to ELOS  Skilled Therapeutic Interventions/Progress Updates:    Pt resting in recliner upon arrival.  NT entered room to record vitals (see below). Pt c/o slight dizziness when standing.  Pt engaged in BADL retraining including bathing and dressing with sit<>stand from recliner.  Pt required increased assistance with sit<>stand and standing with increased posterior lean.  Pt aware of his deficits but unable to self correct.  Pt fatigues quickly.  Pt acknowledges that he requires increased assistance with BADLs as compared to before LUE surgery revision.  Pt's daughters arrived and engaged in discussion about current assist level and LTGs of supervision.  Pt and daughters agree that changing d/c to 7/7 would allow adequate time to increase pt's activity tolerance, functional mobility, and standing balance, in addition to decreased need for assistance with BADLs.  Pt required multiple extended rest breaks between segments of BADLs. Pt remained in recliner with all needs within reach.   Therapy Documentation Precautions:  Precautions Precautions: Fall, Other (comment) Precaution Comments: monitor vitals Required Braces or Orthoses: Other Brace/Splint Other Brace/Splint: L short arm splint Restrictions Weight Bearing Restrictions: Yes LUE Weight Bearing: Weight bearing as tolerated LUE Partial Weight Bearing Percentage or Pounds: platform WB through forearm ok General:   Vital Signs: Therapy Vitals Temp: 97.9 F (36.6 C) Temp Source: Oral Pulse Rate: 63 Resp: 17 BP: (!) 93/43 mmHg (RN notified) Patient Position (if appropriate): Sitting Oxygen Therapy SpO2: 96 % O2 Device: Not  Delivered Pain: Pt denied pain at rest but c/o increased pain 4/10 in left hand/arm with activity; RN aware  See Function Navigator for Current Functional Status.   Therapy/Group: Individual Therapy  [name] 04/24/2016, 11:40 AM

## 2016-04-24 NOTE — Progress Notes (Signed)
   Subjective:  Patient reports pain as marked overnight.  Better now.  Objective:   VITALS:   Filed Vitals:   04/23/16 1800 04/23/16 2200 04/24/16 0115 04/24/16 0636  BP:  136/68 125/63 116/59  Pulse:  65 72 67  Temp:  98 F (36.7 C) 97.9 F (36.6 C) 98.2 F (36.8 C)  TempSrc:  Oral Oral Oral  Resp:  17 16 16   Height:      Weight:    83.8 kg (184 lb 11.9 oz)  SpO2: 99% 98% 98% 93%    Dressing c/d/i Fingers wwp   Lab Results  Component Value Date   WBC 9.3 04/13/2016   HGB 11.6* 04/23/2016   HCT 34.0* 04/23/2016   MCV 97.4 04/13/2016   PLT 266 04/13/2016     Assessment/Plan:  1 Day Post-Op   - Platform weight bearing to left forearm - stable from ortho stand point - f/u 2 weeks  Marianna Payment 04/24/2016, 8:00 AM [phone]
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
## 2016-04-24 NOTE — Progress Notes (Signed)
Physical Therapy Session Note  Patient Details  Name: [name] MRN: [id number] Date of Birth: [birth year]  Today's Date: 04/24/2016 PT Individual Time: 0800-0925 PT Individual Time Calculation (min): 85 min   Short Term Goals: Week 2:  PT Short Term Goal 1 (Week 2): = LTGs due to anticipated LOS  Skilled Therapeutic Interventions/Progress Updates:   Session focused on functional mobility training, standing balance, activity tolerance, and continued discharge planning. Patient required verbal cues for sequencing bed mobility with HOB flat and no rail with supervision, sit <> stand transfers with steady assist due to posterior LOB, gait training multiple trials in controlled environment using SPC with supervision-steady assist due to imbalance especially with turns up to 150 ft, simulated car transfer to sedan height using SPC with close supervision and verbal cues for technique, stair training up/down 4 (6") stairs using 1 rail with step-to pattern and min A overall, and game of horseshoes to challenge standing balance, 3 trials with min-total A to prevent LOB due to posterior LOB and absent balance strategies. Patient underwent I and D and revision ORIF LUE yesterday and patient reports spending most of yesterday in bed. Anticipate recent surgery and immobility contributing to patient's decreased activity tolerance and impaired standing balance compared to functional mobility status prior to surgery. Discussed with patient, patient's daughter via telephone, and primary OT and recommending family training tomorrow prior to DC due to patient's increased need for assistance/impaired balance at this time. Patient left sitting in recliner with all needs in reach.   Therapy Documentation Precautions:  Precautions Precautions: Fall, Other (comment) Precaution Comments: monitor vitals Required Braces or Orthoses: Other Brace/Splint Other Brace/Splint: L short arm splint Restrictions Weight Bearing  Restrictions: Yes LUE Weight Bearing: Weight bearing as tolerated LUE Partial Weight Bearing Percentage or Pounds: platform WB through forearm ok Pain: Pain Assessment Pain Assessment: No/denies pain   See Function Navigator for Current Functional Status.   Therapy/Group: Individual Therapy  Laretta Alstrom 04/24/2016, 9:14 AM

## 2016-04-25 ENCOUNTER — Inpatient Hospital Stay (HOSPITAL_COMMUNITY): Payer: Medicare Other | Admitting: Physical Therapy

## 2016-04-25 ENCOUNTER — Inpatient Hospital Stay (HOSPITAL_COMMUNITY): Payer: Medicare Other | Admitting: Speech Pathology

## 2016-04-25 ENCOUNTER — Inpatient Hospital Stay (HOSPITAL_COMMUNITY): Payer: BLUE CROSS/BLUE SHIELD

## 2016-04-25 NOTE — Progress Notes (Signed)
Speech Language Pathology Daily Session Note  Patient Details  Name: [name] MRN: [id number] Date of Birth: 1931-07-05  Today's Date: 04/25/2016 SLP Individual Time: 1405-1500 SLP Individual Time Calculation (min): 55 min  Short Term Goals: Week 2: SLP Short Term Goal 1 (Week 2): STG=LTG due to length of stay  Skilled Therapeutic Interventions: Skilled treatment session focused on cognitive goals. SLP facilitated session by re-administering the MoCA (version 8.1). Patient scored 24/30 points with a score of 16 or above considered normal. Patient continues to demonstrate deficits in executive functioning, recall and complex organization of information. Patient also participated in a functional conversation that focused on discharge planning and required Mod A multimodal cues for anticipatory awareness. However, patient demonstrated increased emergent awareness of deficits with cognitive tasks today. Patient left supine in bed with all needs within reach. Continue with current plan of care.    Function:  Cognition Comprehension Comprehension assist level: Understands complex 90% of the time/cues 10% of the time  Expression   Expression assist level: Expresses complex 90% of the time/cues < 10% of the time  Social Interaction Social Interaction assist level: Interacts appropriately 90% of the time - Needs monitoring or encouragement for participation or interaction.  Problem Solving Problem solving assist level: Solves basic problems with no assist  Memory Memory assist level: Recognizes or recalls 75 - 89% of the time/requires cueing 10 - 24% of the time    Pain No/Denies Pain   Therapy/Group: Individual Therapy  Catalino Plascencia 04/25/2016, 4:17 PM

## 2016-04-25 NOTE — Progress Notes (Signed)
Physical Therapy Session Note  Patient Details  Name: [name] MRN: [id number] Date of Birth: 1931-06-22  Today's Date: 04/25/2016 PT Individual Time: 1030-1153 PT Individual Time Calculation (min): 83 min   Short Term Goals: Week 2:  PT Short Term Goal 1 (Week 2): = LTGs due to anticipated LOS  Skilled Therapeutic Interventions/Progress Updates:   Session focused on hands-on family training with daughter [name] for ambulation using SPC in controlled and outdoor environments including up/down ramp, across uneven mulch surfaces, brick surfaces, over thresholds with supervision-steady assist with cues for standing on patient's L side and guarding at waist instead of holding onto arm, simulated car transfer to sedan height using SPC with close supervision, and up/down 4 (6") stairs using R rail only with steady assist. Patient required max multimodal cues for technique for sit <> stand transfers to bring feet under him, keep feet hip width apart, and anterior weight shift from variety of surfaces with supervision-steady assist and often multiple attempts to achieve standing. Patient fatigued at end of session, propelled transport chair back to room using BLE with supervision. Patient required prolonged rest breaks and verbal cues for pursed lip breathing due to SOB and fatigue. Patient and daughter with no further questions regarding discharge home at end of week. Patient left sitting in recliner with daughter present.    Therapy Documentation Precautions:  Precautions Precautions: Fall, Other (comment) Precaution Comments: monitor vitals Required Braces or Orthoses: Other Brace/Splint Other Brace/Splint: L short arm splint Restrictions Weight Bearing Restrictions: Yes LUE Weight Bearing: Weight bearing as tolerated LUE Partial Weight Bearing Percentage or Pounds: platform WB through forearm ok (platform WB) Pain: Pain Assessment Pain Assessment: No/denies pain   See Function Navigator  for Current Functional Status.   Therapy/Group: Individual Therapy  Laretta Alstrom 04/25/2016, 12:06 PM

## 2016-04-25 NOTE — Progress Notes (Signed)
Social Work Patient ID: [name], male   DOB: 19-Apr-1931, 82 y.o.   MRN: [id number]   Have reviewed team conference with pt who is aware and agreeable with change of d/c date to 7/7 per tx recommendation.  He reports that daughter is also aware.  Have arranged DME and HH already.    Jesse Crumpacker, LCSW
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
## 2016-04-25 NOTE — Progress Notes (Signed)
Occupational Therapy Session Note  Patient Details  Name: KIDUS BESTUL MRN: [id number] Date of Birth: 02-18-1931  Today's Date: 04/25/2016 OT Individual Time: 0900-1000 OT Individual Time Calculation (min): 60 min    Short Term Goals: Week 2:  OT Short Term Goal 1 (Week 2): STG=LTG secondary to ELOS  Skilled Therapeutic Interventions/Progress Updates:    Pt resting in bed upon arrival.  Pt engaged in BADL retraining including toilet transfers, toileting, bathing at shower level, and dressing with sit<>stand from chair.  Pt c/o slight dizziness when first sitting EOB and standing but quickly resolved.  No posterior lean noted this morning.  Pt amb with SPC to bathroom to use toilet and transfer to walk-in shower. Pt required assistance with managing hand held shower attachment.  Pt required min verbal cues for task initiation and sequencing throughout bathing.  Pt required steady A when standing to bathe buttocks.  Recommended that pt's daughter [name]) stand next to tub when pt taking shower at home to assist with supply management and provide supplies for pt.  Discussed with [name] after her arrival.  Both verbalized understanding of recommendation.  Pt completed dressing tasks with sit<>stand from recliner.  Pt continues to fatigue quickly with SOB and requires extended rest breaks.  Discussed energy conservation strategies.  Focus on functional amb with SPC, sit<>stand, standing balance, BADL retraining, activity tolerance, and safety awareness to increase independence with BADLs.   Therapy Documentation Precautions:  Precautions Precautions: Fall, Other (comment) Precaution Comments: monitor vitals Required Braces or Orthoses: Other Brace/Splint Other Brace/Splint: L short arm splint Restrictions Weight Bearing Restrictions: Yes LUE Weight Bearing: Weight bearing as tolerated LUE Partial Weight Bearing Percentage or Pounds: platform WB through forearm ok (platform WB)   Pain: Pain  Assessment Pain Assessment: No/denies pain  See Function Navigator for Current Functional Status.   Therapy/Group: Individual Therapy  [name] 04/25/2016, 11:21 AM

## 2016-04-26 ENCOUNTER — Inpatient Hospital Stay (HOSPITAL_COMMUNITY): Payer: BLUE CROSS/BLUE SHIELD

## 2016-04-26 ENCOUNTER — Inpatient Hospital Stay (HOSPITAL_COMMUNITY): Payer: BLUE CROSS/BLUE SHIELD | Admitting: Occupational Therapy

## 2016-04-26 ENCOUNTER — Encounter (HOSPITAL_COMMUNITY): Payer: Self-pay | Admitting: Orthopaedic Surgery

## 2016-04-26 ENCOUNTER — Inpatient Hospital Stay (HOSPITAL_COMMUNITY): Payer: Medicare Other | Admitting: Speech Pathology

## 2016-04-26 ENCOUNTER — Inpatient Hospital Stay (HOSPITAL_COMMUNITY): Payer: BLUE CROSS/BLUE SHIELD | Admitting: Physical Therapy

## 2016-04-26 LAB — BASIC METABOLIC PANEL
ANION GAP: 8 (ref 5–15)
BUN: 10 mg/dL (ref 6–20)
CHLORIDE: 90 mmol/L — AB (ref 101–111)
CO2: 27 mmol/L (ref 22–32)
Calcium: 9 mg/dL (ref 8.9–10.3)
Creatinine, Ser: 0.86 mg/dL (ref 0.61–1.24)
GFR calc non Af Amer: >60 mL/min (ref >60)
Glucose, Bld: 122 mg/dL — ABNORMAL HIGH (ref 65–99)
POTASSIUM: 3.8 mmol/L (ref 3.5–5.1)
SODIUM: 125 mmol/L — AB (ref 135–145)

## 2016-04-26 LAB — CBC
HCT: 31.7 % — ABNORMAL LOW (ref 39.0–52.0)
HEMOGLOBIN: 10.6 g/dL — AB (ref 13.0–17.0)
MCH: 33.1 pg (ref 26.0–34.0)
MCHC: 33.4 g/dL (ref 30.0–36.0)
MCV: 99.1 fL (ref 78.0–100.0)
PLATELETS: 130 10*3/uL — AB (ref 150–400)
RBC: 3.2 MIL/uL — AB (ref 4.22–5.81)
RDW: 13.4 % (ref 11.5–15.5)
WBC: 5.4 10*3/uL (ref 4.0–10.5)

## 2016-04-26 LAB — OSMOLALITY: Osmolality: 260 mOsm/kg — ABNORMAL LOW (ref 275–295)

## 2016-04-26 MED ORDER — POLYETHYLENE GLYCOL 3350 17 G PO PACK: 17.0000 g | PACK | Freq: Two times a day (BID) | ORAL | Status: DC

## 2016-04-26 MED ORDER — TRAZODONE HCL 50 MG PO TABS: 25.0000 mg | ORAL_TABLET | Freq: Every evening | ORAL | Status: DC | PRN

## 2016-04-26 MED ORDER — HYDROCODONE-ACETAMINOPHEN 5-325 MG PO TABS: 0.5000 | ORAL_TABLET | Freq: Two times a day (BID) | ORAL | Status: DC | PRN

## 2016-04-26 MED ORDER — GUAIFENESIN ER 600 MG PO TB12: 1200.0000 mg | ORAL_TABLET | Freq: Two times a day (BID) | ORAL | Status: DC | PRN

## 2016-04-26 MED ORDER — THIAMINE HCL 100 MG PO TABS: 100.0000 mg | ORAL_TABLET | Freq: Every day | ORAL | Status: AC

## 2016-04-26 MED ORDER — LEVOTHYROXINE SODIUM 25 MCG PO TABS: 25.0000 ug | ORAL_TABLET | Freq: Every day | ORAL | Status: DC

## 2016-04-26 MED ORDER — CLOPIDOGREL BISULFATE 75 MG PO TABS: 75.0000 mg | ORAL_TABLET | Freq: Every day | ORAL | Status: DC

## 2016-04-26 MED ORDER — AMIODARONE HCL 200 MG PO TABS: 200.0000 mg | ORAL_TABLET | Freq: Every day | ORAL | Status: DC

## 2016-04-26 MED ORDER — SENNOSIDES-DOCUSATE SODIUM 8.6-50 MG PO TABS: 1.0000 | ORAL_TABLET | Freq: Two times a day (BID) | ORAL | Status: DC

## 2016-04-26 MED ORDER — OXYBUTYNIN CHLORIDE 5 MG PO TABS: 5.0000 mg | ORAL_TABLET | Freq: Two times a day (BID) | ORAL | Status: DC

## 2016-04-26 NOTE — Progress Notes (Signed)
We are asked to review cardiac meds prior to DC. They all appear appropriate except that I don't see that he is on lisinopril which he was on at home. His renal fxn is nl and his BP is. OK. I would restart lisinopril 5 mg qd. Will need a ROV with Drs Caryl Comes and [place].  Lorretta Harp, M.D., [place], [facility], [place] [address] [place], [place]  [address]  [phone] 04/26/2016 5:45 PM

## 2016-04-26 NOTE — Progress Notes (Signed)
Occupational Therapy Session Note  Patient Details  Name: [name] MRN: [id number] Date of Birth: [birth year]  Today's Date: 04/26/2016 OT Individual Time: 0800-0930 OT Individual Time Calculation (min): 90 min    Short Term Goals: Week 2:  OT Short Term Goal 1 (Week 2): STG=LTG secondary to ELOS  Skilled Therapeutic Interventions/Progress Updates:    Pt engaged in BADL retraining including bathing at shower level and dressing with sit<>stand from chair.  Pt amb with SPC to tub room and performed tub bench transfer at supervision level.  Pt completed all bathing and dressing tasks at supervision level this morning with multiple rest breaks. Pt continues to exhibit SOB with activity.  Pt incorporated energy conservation strategies throughout session.  Pt returned to room and rested in recliner before walking to sink to complete grooming tasks while standing at sink.  Pt returned to recliner at end of session. Pt remained in recliner with all needs within reach.  Focus on activity tolerance, sit<>stand, standing balance, functional amb with SPC, and safety awareness to increase independence with BADLs.  Continued discharge planning.  Therapy Documentation Precautions:  Precautions Precautions: Fall, Other (comment) Precaution Comments: monitor vitals Required Braces or Orthoses: Other Brace/Splint Other Brace/Splint: L short arm splint Restrictions Weight Bearing Restrictions: Yes LUE Weight Bearing: Weight bearing as tolerated LUE Partial Weight Bearing Percentage or Pounds: platform WB through forearm ok (platform WB) Pain:  Pt denies pain  See Function Navigator for Current Functional Status.   Therapy/Group: Individual Therapy  [name] 04/26/2016, 9:33 AM

## 2016-04-26 NOTE — Discharge Summary (Signed)
Discharge summary job [id number]

## 2016-04-26 NOTE — OR Nursing (Signed)
Late entry to correct charted implants.

## 2016-04-26 NOTE — Progress Notes (Signed)
Occupational Therapy Discharge Summary  Patient Details  Name: [name] MRN: [id number] Date of Birth: [birth year]   Patient has met 75 of 16 long term goals due to improved activity tolerance, improved balance, ability to compensate for deficits, improved attention, improved awareness and improved coordination.  Pt made steady progress with BADLs during this admission.  Pt completes all tasks at supervision level.  Pt continues to exhibit SOB with activity and requires multiple rest breaks when performing BADLs. Pt incorporates energy conservation strategies for BADLs. Pt's daughters have been present and have verbalized understanding of recommendation for 24 hour supervision. Patient to discharge at overall Supervision level.  Patient's care partner is independent to provide the necessary physical and cognitive assistance at discharge.      Recommendation:  Patient will benefit from ongoing skilled OT services in home health setting to continue to advance functional skills in the area of BADL and Reduce care partner burden.  Equipment: Tub transfer bench, pt owns [facility]  Reasons for discharge: treatment goals met and discharge from hospital  Patient/family agrees with progress made and goals achieved: Yes  OT Discharge    Vision/Perception  Vision- History Baseline Vision/History: Wears glasses Wears Glasses: Reading only Patient Visual Report: No change from baseline Vision- Assessment Vision Assessment?: No apparent visual deficits  Cognition Overall Cognitive Status: Within Functional Limits for tasks assessed Arousal/Alertness: Awake/alert Orientation Level: Oriented X4 Attention: Selective Selective Attention: Appears intact Memory: Impaired Memory Impairment: Decreased recall of new information Awareness: Appears intact Problem Solving: Appears intact Safety/Judgment: Appears intact [employer] Scales of Cognitive Functioning:  Purposeful/appropriate Sensation Sensation Light Touch: Appears Intact Hot/Cold: Appears Intact Proprioception: Appears Intact Motor  Motor Motor: Abnormal postural alignment and control    Trunk/Postural Assessment  Cervical Assessment Cervical Assessment: Exceptions to [facility] (forward flexed) Thoracic Assessment Thoracic Assessment: Exceptions to [facility] (kyphotic) Lumbar Assessment Lumbar Assessment: Exceptions to [facility] (posterior pelvic tilt)  Balance Static Sitting Balance Static Sitting - Balance Support: Feet supported Static Sitting - Level of Assistance: 6: Modified independent (Device/Increase time) Dynamic Sitting Balance Dynamic Sitting - Balance Support: Feet supported;During functional activity Dynamic Sitting - Level of Assistance: 6: Modified independent (Device/Increase time) Extremity/Trunk Assessment RUE Assessment RUE Assessment: Within Functional Limits LUE Assessment LUE Assessment: Exceptions to [facility] LUE Strength LUE Overall Strength: Deficits LUE Overall Strength Comments: PWB through elbow only, ROM appears functional   See Function Navigator for Current Functional Status.  [name] [facility] 04/26/2016, 9:23 AM
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
## 2016-04-26 NOTE — Progress Notes (Signed)
Physical Therapy Discharge Summary  Patient Details  Name: [name] MRN: [id number] Date of Birth: February 07, 1931  Today's Date: 04/26/2016 PT Individual Time: 7828-0766 PT Individual Time Calculation (min): 56 min    Patient has met 10 of 10 long term goals due to improved activity tolerance, improved balance, improved postural control, increased strength, decreased pain and improved awareness.  Patient to discharge at an ambulatory level Supervision.   Patient's care partner is independent to provide the necessary cognitive assistance at discharge.  Recommendation:  Patient will benefit from ongoing skilled PT services in home health setting to continue to advance safe functional mobility, address ongoing impairments in balance, strength, and endurance, and minimize fall risk.  Equipment: SPC and transport chair  Reasons for discharge: treatment goals met  Patient/family agrees with progress made and goals achieved: Yes   Skilled Therapeutic Intervention:  Pt received resting in recliner, no c/o pain, and agreeable to therapy session.  Session focus on assessment of all functional mobility (see below).  Pt is performing transfers, ambulation, and stair negotiation with overall supervision.  Occasional steady assist needed for sit<>stand from a lower/unstable surface like the recliner.  PT provided family education to pt's daughter regarding appropriate supervision and she verbalized understanding and voiced no questions.  PT readminstered BERG balance scale (score below) and explained results to pt and daughter as well as ongoing increased risk of falling.  Pt returned to room at end of session and positioned upright in recliner with call bell in reach and needs met.   PT Discharge Precautions/Restrictions Precautions Precautions: Fall;Other (comment) Restrictions LUE Weight Bearing: Weight bearing as tolerated LUE Partial Weight Bearing Percentage or Pounds: WBAT through elbow  only Pain Pain Assessment Pain Assessment: No/denies pain Faces Pain Scale: No hurt Vision/Perception     Cognition Overall Cognitive Status: Within Functional Limits for tasks assessed Arousal/Alertness: Awake/alert Orientation Level: Oriented X4 Attention: Selective Selective Attention: Appears intact Memory: Impaired Memory Impairment: Decreased recall of new information Awareness: Appears intact Problem Solving: Appears intact Safety/Judgment: Appears intact [employer] Scales of Cognitive Functioning: Purposeful/appropriate Sensation Sensation Light Touch: Appears Intact Hot/Cold: Appears Intact Proprioception: Appears Intact Coordination Gross Motor Movements are Fluid and Coordinated: Yes Fine Motor Movements are Fluid and Coordinated: Yes Motor  Motor Motor: Abnormal postural alignment and control Motor - Discharge Observations: improved activity tolerance, but will decrease with decrease in activity as evidenced by increased need for assist following surgery on monday  Mobility Transfers Transfers: Yes Sit to Stand: 5: Supervision Stand to Sit: 5: Supervision Stand Pivot Transfers: 5: Supervision Locomotion  Ambulation Ambulation/Gait Assistance: 5: Supervision Ambulation Distance (Feet): 150 Feet Assistive device: Straight cane Stairs / Additional Locomotion Stairs: Yes Stairs Assistance: 5: Supervision Stair Management Technique: One rail Right;Alternating pattern;Step to pattern Number of Stairs: 12 Ramp: 5: Supervision Curb: 5: Supervision Wheelchair Mobility Wheelchair Mobility: No  Trunk/Postural Assessment  Cervical Assessment Cervical Assessment: Within Functional Limits Thoracic Assessment Thoracic Assessment: Within Functional Limits Lumbar Assessment Lumbar Assessment: Exceptions to [facility] (preference for posterior pelvic tilt)  Balance Balance Balance Assessed: Yes Standardized Balance Assessment Standardized Balance Assessment:  Berg Balance Test Berg Balance Test Sit to Stand: Able to stand using hands after several tries Standing Unsupported: Able to stand 2 minutes with supervision Sitting with Back Unsupported but Feet Supported on Floor or Stool: Able to sit safely and securely 2 minutes Stand to Sit: Controls descent by using hands Transfers: Able to transfer safely, definite need of hands Standing Unsupported with Eyes  Closed: Able to stand 10 seconds safely Standing Ubsupported with Feet Together: Able to place feet together independently and stand for 1 minute with supervision From Standing, Reach Forward with Outstretched Arm: Can reach forward >12 cm safely (5") From Standing Position, Pick up Object from Floor: Able to pick up shoe safely and easily From Standing Position, Turn to Look Behind Over each Shoulder: Turn sideways only but maintains balance Turn 360 Degrees: Needs assistance while turning Standing Unsupported, Alternately Place Feet on Step/Stool: Able to complete >2 steps/needs minimal assist Standing Unsupported, One Foot in Front: Able to take small step independently and hold 30 seconds Standing on One Leg: Unable to try or needs assist to prevent fall Total Score: 34 Static Sitting Balance Static Sitting - Balance Support: Feet supported Static Sitting - Level of Assistance: 6: Modified independent (Device/Increase time) Dynamic Sitting Balance Dynamic Sitting - Balance Support: Feet supported;During functional activity Dynamic Sitting - Level of Assistance: 6: Modified independent (Device/Increase time) Static Standing Balance Static Standing - Balance Support: Right upper extremity supported;No upper extremity supported Static Standing - Level of Assistance: 5: Stand by assistance Dynamic Standing Balance Dynamic Standing - Balance Support: During functional activity;Right upper extremity supported Dynamic Standing - Level of Assistance: 5: Stand by assistance Extremity Assessment   RUE Assessment RUE Assessment: Within Functional Limits LUE Assessment LUE Assessment: Exceptions to [facility] LUE Strength LUE Overall Strength: Deficits LUE Overall Strength Comments: PWB through elbow only, ROM appears functional RLE Assessment RLE Assessment: Within Functional Limits LLE Assessment LLE Assessment: Within Functional Limits   See Function Navigator for Current Functional Status.  Orval Dortch E Penven-Crew 04/26/2016, 10:47 AM

## 2016-04-26 NOTE — Progress Notes (Signed)
Speech Language Pathology Daily Session Notes  Patient Details  Name: [name] MRN: [id number] Date of Birth: 1931-06-12  Today's Date: 04/26/2016  Session 1: SLP Individual Time: 1105-1130 SLP Individual Time Calculation (min): 25 min   Session 2: SLP Individual Time: [id number] SLP Individual Time Calculation (min): 15 min  Short Term Goals: Week 2: SLP Short Term Goal 1 (Week 2): STG=LTG due to length of stay  Skilled Therapeutic Interventions:  Session 1: Skilled treatment session focused on completion of family education with the patient's daughter in regards to his current cognitive function. Both were educated in regards to strategies to utilize at home to maximize recall, problem solving, and overall safety. Both verbalized understanding of all information and asked appropriate questions. Handouts were also given to reinforce information. Patient's daughter reports she feels patient is at his cognitive baseline and does not feel f/u SLP services are warranted at this time. CSW made aware. Patient left sitting upright in recliner with family present. Continue with current plan of care.   Session 2: Skilled treatment session focused on completion of education with the patient. Upon arrival, patient was awake while supine in bed. Patient recalled events from previous therapy sessions with supervision question cues and reported he has no questions at this time. Patient was able to generate a verbal list of activities he can participate in safely at home with supervision question cues. Patient left supine in bed with all needs within reach. Continue with current plan of care.   Function:  Cognition Comprehension Comprehension assist level: Understands complex 90% of the time/cues 10% of the time  Expression   Expression assist level: Expresses complex ideas: With extra time/assistive device  Social Interaction Social Interaction assist level: Interacts appropriately with others with  medication or extra time (anti-anxiety, antidepressant).  Problem Solving Problem solving assist level: Solves basic problems with no assist  Memory Memory assist level: Recognizes or recalls 90% of the time/requires cueing < 10% of the time    Pain Pain Assessment Pain Assessment: No/denies pain Faces Pain Scale: No hurt  Therapy/Group: Individual Therapy  Kamari Bilek 04/26/2016, 12:21 PM
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
## 2016-04-27 DIAGNOSIS — Z8781 Personal history of (healed) traumatic fracture: Secondary | ICD-10-CM

## 2016-04-27 DIAGNOSIS — D696 Thrombocytopenia, unspecified: Secondary | ICD-10-CM

## 2016-04-27 DIAGNOSIS — E222 Syndrome of inappropriate secretion of antidiuretic hormone: Secondary | ICD-10-CM | POA: Insufficient documentation

## 2016-04-27 DIAGNOSIS — S6292XS Unspecified fracture of left wrist and hand, sequela: Secondary | ICD-10-CM

## 2016-04-27 DIAGNOSIS — I712 Thoracic aortic aneurysm, without rupture, unspecified: Secondary | ICD-10-CM | POA: Diagnosis present

## 2016-04-27 DIAGNOSIS — Z967 Presence of other bone and tendon implants: Secondary | ICD-10-CM

## 2016-04-27 LAB — BASIC METABOLIC PANEL
Anion gap: 8 (ref 5–15)
BUN: 9 mg/dL (ref 6–20)
CO2: 26 mmol/L (ref 22–32)
CREATININE: 0.71 mg/dL (ref 0.61–1.24)
Calcium: 9.2 mg/dL (ref 8.9–10.3)
Chloride: 92 mmol/L — ABNORMAL LOW (ref 101–111)
GFR calc Af Amer: >60 mL/min (ref >60)
GLUCOSE: 96 mg/dL (ref 65–99)
Potassium: 3.9 mmol/L (ref 3.5–5.1)
SODIUM: 126 mmol/L — AB (ref 135–145)

## 2016-04-27 LAB — MAGNESIUM: MAGNESIUM: 1.7 mg/dL (ref 1.7–2.4)

## 2016-04-27 MED ORDER — MAGNESIUM OXIDE 400 (241.3 MG) MG PO TABS: 200.0000 mg | ORAL_TABLET | Freq: Every day | ORAL | Status: AC

## 2016-04-27 MED ORDER — MAGNESIUM OXIDE 400 (241.3 MG) MG PO TABS
200.0000 mg | ORAL_TABLET | Freq: Every day | ORAL | Status: DC
  Administered 2016-04-27: 200 mg via ORAL
  Filled 2016-04-27: qty 1

## 2016-04-27 NOTE — Progress Notes (Signed)
After reviewing the patients' medication list, the two medications that can potentially cause hyponatremia are coreg and trazodone.  Hyponatremia caused by trazodone is usually associated with overdoses, but it still has the potential of causing SIADH.  The incidence, however, is much less than what most people are familiar with anti-depressants like SSRI, etc.  Thanks for the consult

## 2016-04-27 NOTE — Progress Notes (Signed)
Speech Language Pathology Discharge Summary  Patient Details  Name: [name] MRN: [id number] Date of Birth: 1931-07-14   Patient has met 3 of 3 long term goals.  Patient to discharge at overall Supervision level.   Reasons goals not met: N/A   Clinical Impression/Discharge Summary: Patient has made functional gains and has met 3 of 3 LTG's this admission due to improved cognitive function. Currently, patient demonstrates behaviors consistent with a Rancho Level VIII and requires overall supervision to complete functional and familiar tasks safely in regards to recall, problem solving and awareness. Patient and family education is complete and both report the patient is at his cognitive baseline and report they feel f/u SLP services are not needed at this time. Educated both the patient and his daughter on the importance of receiving a referral for services if difficulty arises at home, both verbalized understanding. Patient will discharge home with 24 hour supervision from family.   Care Partner:  Caregiver Able to Provide Assistance: Yes  Type of Caregiver Assistance: Physical;Cognitive  Recommendation:  None      Equipment: N/A   Reasons for discharge: Treatment goals met;Discharged from hospital   Patient/Family Agrees with Progress Made and Goals Achieved: Yes      Barrera, [place] 04/27/2016, 6:57 AM
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
## 2016-04-27 NOTE — Progress Notes (Signed)
Pt. Got d/c orders and instructions.Pt. Ready to go home with his daughters.

## 2016-04-27 NOTE — Discharge Instructions (Signed)
Inpatient Rehab Discharge Instructions  [name] Discharge date and time: 04/27/16  Activities/Precautions/ Functional Status: Activity: Partial weightbearing on left elbow only and forearm. No weight on wrist or hands.  -- No driving for 6 months. -- No lifting items over 5 lbs.  --No strenuous activity.  Diet: cardiac diet Limit fluid to 1500 cc/day.  Wound Care: keep wound clean and dry   Functional status:  ___ No restrictions     ___ Walk up steps independently _X__ 24/7 supervision/assistance   ___ Walk up steps with assistance ___ Intermittent supervision/assistance  ___ Bathe/dress independently _X__ Walk with walker    _X_ Bathe/dress with assistance ___ Walk Independently    ___ Shower independently ___ Walk with assistance    ___ Shower with assistance _X__ No alcohol     ___ Return to work/school ________   COMMUNITY REFERRALS UPON DISCHARGE:    Home Health:   PT     OT     ST    RN                 Agency:  [facility]  Phone:  [phone]   Medical Equipment/Items Ordered: transport wheelchair, cane, tub bench                                                     Agency/Supplier:  [facility] @ [id number]  Special Instructions: 1. Needs to have TSH levels checked in 4 weeks. 2. Avoid being out in the sunlight. No driving for 6 months per cardiology.  3.Take time with positional changes.  Wear abdominal binder before standing and when ambulating.  4. Needs yearly CTA for follow up of thoracic aneurysm.     My questions have been answered and I understand these instructions. I will adhere to these goals and the provided educational materials after my discharge from the hospital.  Patient/Caregiver Signature _______________________________ Date __________  Clinician Signature _______________________________________ Date __________  Please bring this form and your medication list with you to all your follow-up doctor's appointments.

## 2016-04-27 NOTE — Progress Notes (Signed)
Orthostatic VS for the past 24 hrs:  BP- Sitting Pulse- Sitting BP- Standing at 0 minutes Pulse- Standing at 0 minutes  04/27/16 0622 111/60 mmHg 74 99/54 mmHg 79    Supine BP 134/63--Patient continues to have bouts of dizziness with orthostatic changes recorded. Lisinopril discontinued 06/23 due to orthostatic changes and hyperkalemia. Will discontinue trazodone and instruct patient to use abdominal binder when mobile.

## 2016-04-27 NOTE — Progress Notes (Signed)
Social Work  Discharge Note  The overall goal for the admission was met for:   Discharge location: Yes - home with daughter, [name], to stay and provide 24/7 supervision/ assist  Length of Stay: Yes - 18 days  Discharge activity level: Yes - supervision  Home/community participation: Yes  Services provided included: MD, RD, PT, OT, SLP, RN, TR, Pharmacy, Neuropsych and SW  Financial Services: Medicare and Private Insurance: [employer]  Follow-up services arranged: Home Health: PT, OT via [facility], DME: transport w/c, cane, tub bench via [facility] and Patient/Family has no preference for HH/DME agencies  Comments (or additional information):  Patient/Family verbalized understanding of follow-up arrangements: Yes  Individual responsible for coordination of the follow-up plan: pt/daughter  Confirmed correct DME delivered: Lennart Pall 04/27/2016    [name]

## 2016-04-30 ENCOUNTER — Telehealth: Payer: Self-pay | Admitting: Physical Medicine & Rehabilitation

## 2016-04-30 ENCOUNTER — Telehealth: Payer: Self-pay | Admitting: Pulmonary Disease

## 2016-04-30 DIAGNOSIS — Z0000 Encounter for general adult medical examination without abnormal findings: Secondary | ICD-10-CM | POA: Diagnosis not present

## 2016-04-30 NOTE — Telephone Encounter (Signed)
Left message with Zigmund Daniel @ [facility] with ok on verbal order for RN POC

## 2016-04-30 NOTE — Telephone Encounter (Signed)
Pam RN with [facility] needs to get verbal orders for 3w1, 2w8 and 2 prn visits.  Please call her at [phone].

## 2016-04-30 NOTE — Telephone Encounter (Signed)
I spoke with [name] and referred her question to Dr Jeannine Kitten office.

## 2016-04-30 NOTE — Telephone Encounter (Signed)
Spoke with daughter Cyndi. Pt was given amiodarone from recent hospitalization. Pt has not started taken this yet. The pharmacy advised since pt is on simvastatin as well and was told to call us to ensure it is okay for him to take this medication. Please advise SN thanks  Allergies  Allergen Reactions  . Lisinopril     ? Cause of hyperkalemia     Current Outpatient Prescriptions on File Prior to Visit  Medication Sig Dispense Refill  . ADVAIR DISKUS 250-50 MCG/DOSE AEPB INHALE 1 PUFF TWICE DAILY. (Patient taking differently: INHALE 1 PUFF  DAILY.) 60 each 5  . amiodarone (PACERONE) 200 MG tablet Take 1 tablet (200 mg total) by mouth daily. 30 tablet 0  . carvedilol (COREG) 3.125 MG tablet TAKE 1 TABLET TWICE DAILY WITH A MEAL. 60 tablet 0  . cholecalciferol (VITAMIN D) 1000 units tablet Take 1,000 Units by mouth daily.    . clopidogrel (PLAVIX) 75 MG tablet Take 1 tablet (75 mg total) by mouth daily. 30 tablet 0  . guaiFENesin (MUCINEX) 600 MG 12 hr tablet Take 2 tablets (1,200 mg total) by mouth 2 (two) times daily as needed for cough or to loosen phlegm. 120 tablet 0  . HYDROcodone-acetaminophen (NORCO/VICODIN) 5-325 MG tablet Take 0.5 tablets by mouth 2 (two) times daily as needed for severe pain. 5 tablet 0  . levothyroxine (SYNTHROID, LEVOTHROID) 25 MCG tablet Take 1 tablet (25 mcg total) by mouth daily before breakfast. 30 tablet 0  . magnesium oxide (MAG-OX) 400 (241.3 Mg) MG tablet Take 0.5 tablets (200 mg total) by mouth daily. 30 tablet 0  . Multiple Vitamin (MULTIVITAMIN) capsule Take 1 capsule by mouth daily.    . Multiple Vitamins-Minerals (PRESERVISION/LUTEIN) CAPS Take 1 capsule by mouth daily.     Marland Kitchen oxybutynin (DITROPAN) 5 MG tablet Take 1 tablet (5 mg total) by mouth 2 (two) times daily. 60 tablet 0  . polyethylene glycol (MIRALAX / GLYCOLAX) packet Take 17 g by mouth 2 (two) times daily. 60 each 0  . senna-docusate (SENOKOT-S) 8.6-50 MG tablet Take 1 tablet by mouth 2 (two)  times daily. 60 tablet 0  . simvastatin (ZOCOR) 40 MG tablet TAKE ONE TABLET AT BEDTIME. 90 tablet 3  . thiamine 100 MG tablet Take 1 tablet (100 mg total) by mouth daily. 30 tablet 0   No current facility-administered medications on file prior to visit.

## 2016-04-30 NOTE — Telephone Encounter (Signed)
Per SN::  Hold the simvastatin for now and come in on Friday 7/14 at 10.  Bring all medications with him to this appt.  Called and spoke with pts daughter cindi, and she is aware of appt at 73.

## 2016-04-30 NOTE — Telephone Encounter (Signed)
[name] (pt's daughter) has questions about medications that he was discharged from hospital with.  The pharmacist said he should not be taking the Simvastatin with his heart medication, so they have stopped taking it.  Please call her at [phone].

## 2016-05-01 ENCOUNTER — Telehealth: Payer: Self-pay | Admitting: *Deleted

## 2016-05-01 NOTE — Telephone Encounter (Signed)
Attempt # 1 :  Left message on voicemail requesting call back to the office Call # 2 contact made with [name] his daughter   1. Are you/is patient experiencing any problems since coming home? Are there any questions regarding any aspect of care? Had one incidence last pm with not feeling well and BP check was elevated and he thought he might be feverish but no elevated temp.  His BP decreased at recheck and his daughter feels like he was anxious because he did not know where she was at the time.  He is currently in orthopedic office being checked and incisions look good without sign of infection. 2. Are there any questions regarding medications administration/dosing? Are meds being taken as prescribed? Patient should review meds with caller to confirm : addressed with [name] and referred her question to Dr Jeannine Kitten office. 3. Have there been any falls? No 4. Has Home Health been to the house and/or have they contacted you? If not, have you tried to contact them? Can we help you contact them? Yes he has had 2 RN visits and PT OT has been out. 5. Are bowels and bladder emptying properly? Are there any unexpected incontinence issues? If applicable, is patient following bowel/bladder programs? No problems 6. Any fevers, problems with breathing, unexpected pain? No 7. Are there any skin problems or new areas of breakdown? No 8. Has the patient/family member arranged specialty MD follow up (ie cardiology/neurology/renal/surgical/etc)?  Can we help arrange? Yes in ortho office currently, sees Dr Lenna Gilford 05/04/16,  appointment with Dr Posey Pronto given today. He will see Dr Naaman Plummer after initial visit with Dr Posey Pronto. 9. Does the patient need any other services or support that we can help arrange? No  10. Are caregivers following through as expected in assisting the patient? Yes             Has the patient quit smoking, drinking alcohol, or using drugs as recommended? N/A  Appointment with Dr Posey Pronto 05/10/16 @ 11:40  Arrive  by 11:15

## 2016-05-02 DIAGNOSIS — M25441 Effusion, right hand: Secondary | ICD-10-CM | POA: Diagnosis not present

## 2016-05-02 DIAGNOSIS — S62309B Unspecified fracture of unspecified metacarpal bone, initial encounter for open fracture: Secondary | ICD-10-CM | POA: Diagnosis not present

## 2016-05-02 DIAGNOSIS — M6281 Muscle weakness (generalized): Secondary | ICD-10-CM | POA: Diagnosis not present

## 2016-05-02 DIAGNOSIS — S62399A Other fracture of unspecified metacarpal bone, initial encounter for closed fracture: Secondary | ICD-10-CM | POA: Diagnosis not present

## 2016-05-03 ENCOUNTER — Telehealth: Payer: Self-pay | Admitting: Pulmonary Disease

## 2016-05-03 NOTE — Telephone Encounter (Signed)
Called and lmom for joann at [facility] to make her aware ok for PT 2 times per week x 8 weeks.

## 2016-05-04 ENCOUNTER — Ambulatory Visit (INDEPENDENT_AMBULATORY_CARE_PROVIDER_SITE_OTHER)
Admission: RE | Admit: 2016-05-04 | Discharge: 2016-05-04 | Disposition: A | Discharge status: Home / Self Care | Payer: Medicare Other | Source: Ambulatory Visit | Attending: Pulmonary Disease | Admitting: Pulmonary Disease

## 2016-05-04 ENCOUNTER — Encounter: Payer: Self-pay | Admitting: Pulmonary Disease

## 2016-05-04 ENCOUNTER — Ambulatory Visit (INDEPENDENT_AMBULATORY_CARE_PROVIDER_SITE_OTHER): Payer: Medicare Other | Admitting: Pulmonary Disease

## 2016-05-04 ENCOUNTER — Encounter: Payer: Self-pay | Admitting: Internal Medicine

## 2016-05-04 VITALS — BP 124/68 | HR 68 | Temp 97.5°F | Ht 71.0 in | Wt 177.0 lb

## 2016-05-04 DIAGNOSIS — J449 Chronic obstructive pulmonary disease, unspecified: Secondary | ICD-10-CM | POA: Diagnosis not present

## 2016-05-04 DIAGNOSIS — S298XXD Other specified injuries of thorax, subsequent encounter: Secondary | ICD-10-CM

## 2016-05-04 DIAGNOSIS — E871 Hypo-osmolality and hyponatremia: Secondary | ICD-10-CM

## 2016-05-04 DIAGNOSIS — R079 Chest pain, unspecified: Secondary | ICD-10-CM | POA: Diagnosis not present

## 2016-05-04 DIAGNOSIS — E7800 Pure hypercholesterolemia, unspecified: Secondary | ICD-10-CM

## 2016-05-04 DIAGNOSIS — S298XXA Other specified injuries of thorax, initial encounter: Secondary | ICD-10-CM | POA: Insufficient documentation

## 2016-05-04 DIAGNOSIS — I255 Ischemic cardiomyopathy: Secondary | ICD-10-CM

## 2016-05-04 MED ORDER — PRAVASTATIN SODIUM 40 MG PO TABS: 40.0000 mg | ORAL_TABLET | Freq: Every day | ORAL | Status: DC

## 2016-05-04 NOTE — Patient Instructions (Signed)
Today we updated your med list in our EPIC system...    Continue your current medications the same...    Except we are changing your SIMVASTATIN to PRAVASTATIN 40mg - take one tab at bedtime...  Today we checked a follow up CXR-     We will contact you w/ the results when available...   We reviewed the recent blood work done by your [facility] home [facility]    We will ask them to repeat the tests in 2wks...  Call for any questions or if we can be of service in any way...  Let's plan a follow up visit in 1 month, sooner if needed for problems...    Wednesday June 06, 2016 at 11:00 AM
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
## 2016-05-07 ENCOUNTER — Inpatient Hospital Stay: Payer: Medicare Other | Admitting: Pulmonary Disease

## 2016-05-08 ENCOUNTER — Telehealth: Payer: Self-pay | Admitting: Pulmonary Disease

## 2016-05-08 DIAGNOSIS — M6281 Muscle weakness (generalized): Secondary | ICD-10-CM | POA: Diagnosis not present

## 2016-05-08 DIAGNOSIS — S62399A Other fracture of unspecified metacarpal bone, initial encounter for closed fracture: Secondary | ICD-10-CM | POA: Diagnosis not present

## 2016-05-08 DIAGNOSIS — M25441 Effusion, right hand: Secondary | ICD-10-CM | POA: Diagnosis not present

## 2016-05-08 DIAGNOSIS — S62309B Unspecified fracture of unspecified metacarpal bone, initial encounter for open fracture: Secondary | ICD-10-CM | POA: Diagnosis not present

## 2016-05-08 NOTE — Telephone Encounter (Signed)
Called and spoke with pts daughter cindy and she stated that the pt has not had a BM in over 1 week.  She stated that the pt is on miralax bid and stool softner bid.  She is requesting further recs from SN.   She also stated that the pt was advised upon discharge that he is on a limited fluid intake. SN please advise.  thanks   Allergies  Allergen Reactions  . Lisinopril     ? Cause of hyperkalemia

## 2016-05-08 NOTE — Telephone Encounter (Signed)
Called and gave VO. Nothing further needed 

## 2016-05-08 NOTE — Telephone Encounter (Signed)
Per SN---  Tonight give the pt dulcolax 4 tabs tonight and if nothing by morning give dulcolax supp x 1.   Will keep his meds the same with the miralax 1 capful in water bid and change the colace to senokot s at bedtime.  pts daughter is aware of the instructions for tonight and to call us back tomorrow with an update.  SN stated that it is hard to tell what the pt may need, but we may need to have an abd xray done.  pts daughter to call back tomorrow with update.

## 2016-05-09 ENCOUNTER — Telehealth: Payer: Self-pay | Admitting: Pulmonary Disease

## 2016-05-09 DIAGNOSIS — K5900 Constipation, unspecified: Secondary | ICD-10-CM

## 2016-05-09 NOTE — Telephone Encounter (Signed)
Spoke with pt's daughter and she states that after suppository this morning pt was able to have a BM and he does feel better. She would like to know what maintenance medications he should be on for the constipation. They have Dulcolax, Senokot-S and Miralax.   SN _ Please advise. Thanks!   Allergies  Allergen Reactions  . Lisinopril     ? Cause of hyperkalemia    Current Outpatient Prescriptions on File Prior to Visit  Medication Sig Dispense Refill  . ADVAIR DISKUS 250-50 MCG/DOSE AEPB INHALE 1 PUFF TWICE DAILY. (Patient taking differently: INHALE 1 PUFF  DAILY.) 60 each 5  . amiodarone (PACERONE) 200 MG tablet Take 1 tablet (200 mg total) by mouth daily. 30 tablet 0  . carvedilol (COREG) 3.125 MG tablet TAKE 1 TABLET TWICE DAILY WITH A MEAL. 60 tablet 0  . cholecalciferol (VITAMIN D) 1000 units tablet Take 1,000 Units by mouth daily.    . clopidogrel (PLAVIX) 75 MG tablet Take 1 tablet (75 mg total) by mouth daily. 30 tablet 0  . guaiFENesin (MUCINEX) 600 MG 12 hr tablet Take 2 tablets (1,200 mg total) by mouth 2 (two) times daily as needed for cough or to loosen phlegm. 120 tablet 0  . HYDROcodone-acetaminophen (NORCO/VICODIN) 5-325 MG tablet Take 0.5 tablets by mouth 2 (two) times daily as needed for severe pain. 5 tablet 0  . levothyroxine (SYNTHROID, LEVOTHROID) 25 MCG tablet Take 1 tablet (25 mcg total) by mouth daily before breakfast. 30 tablet 0  . magnesium oxide (MAG-OX) 400 (241.3 Mg) MG tablet Take 0.5 tablets (200 mg total) by mouth daily. 30 tablet 0  . Multiple Vitamin (MULTIVITAMIN) capsule Take 1 capsule by mouth daily.    . Multiple Vitamins-Minerals (PRESERVISION/LUTEIN) CAPS Take 1 capsule by mouth daily.     Marland Kitchen oxybutynin (DITROPAN) 5 MG tablet Take 1 tablet (5 mg total) by mouth 2 (two) times daily. 60 tablet 0  . polyethylene glycol (MIRALAX / GLYCOLAX) packet Take 17 g by mouth 2 (two) times daily. 60 each 0  . pravastatin (PRAVACHOL) 40 MG tablet Take 1 tablet (40  mg total) by mouth daily. 90 tablet 3  . senna-docusate (SENOKOT-S) 8.6-50 MG tablet Take 1 tablet by mouth 2 (two) times daily. 60 tablet 0  . simvastatin (ZOCOR) 40 MG tablet TAKE ONE TABLET AT BEDTIME. (Patient not taking: Reported on 05/04/2016) 90 tablet 3  . thiamine 100 MG tablet Take 1 tablet (100 mg total) by mouth daily. 30 tablet 0   No current facility-administered medications on file prior to visit.

## 2016-05-09 NOTE — Telephone Encounter (Signed)
Spoke with Cindy  Pt followed the below instructions given yesterday and still no BM   Per SN---  Tonight give the pt dulcolax 4 tabs tonight and if nothing by morning give dulcolax supp x 1. Will keep his meds the same with the miralax 1 capful in water bid and change the colace to senokot s at bedtime. pts daughter is aware of the instructions for tonight and to call us back tomorrow with an update. SN stated that it is hard to tell what the pt may need, but we may need to have an abd xray done. pts daughter to call back tomorrow with update  She is wondering if we want to go ahead and order abd xray  Please advise, thanks!

## 2016-05-09 NOTE — Telephone Encounter (Signed)
Spoke with [name]  She reports it is hard to tell if there is any abd distention b/c "he has a belly anyway" He is not having any pain  Leigh aware and will notify SN  KUB order sent to [facility] to be done at [facility]

## 2016-05-09 NOTE — Telephone Encounter (Signed)
lmtcb x1 for [name] 

## 2016-05-09 NOTE — Telephone Encounter (Signed)
Per SN--  Is the pts abd distented? Is he in any pain?  Ok to order the KUB xray---this can be done wherever the pt would like.  DX   constipation

## 2016-05-10 ENCOUNTER — Encounter: Payer: Medicare Other | Attending: Physical Medicine & Rehabilitation | Admitting: Physical Medicine & Rehabilitation

## 2016-05-10 ENCOUNTER — Encounter: Payer: Self-pay | Admitting: Physical Medicine & Rehabilitation

## 2016-05-10 ENCOUNTER — Telehealth: Payer: Self-pay | Admitting: Physical Medicine & Rehabilitation

## 2016-05-10 ENCOUNTER — Telehealth: Payer: Self-pay | Admitting: Cardiology

## 2016-05-10 VITALS — BP 124/67 | HR 66 | Resp 17

## 2016-05-10 DIAGNOSIS — S069X0D Unspecified intracranial injury without loss of consciousness, subsequent encounter: Secondary | ICD-10-CM | POA: Insufficient documentation

## 2016-05-10 DIAGNOSIS — I2510 Atherosclerotic heart disease of native coronary artery without angina pectoris: Secondary | ICD-10-CM | POA: Diagnosis not present

## 2016-05-10 DIAGNOSIS — R269 Unspecified abnormalities of gait and mobility: Secondary | ICD-10-CM

## 2016-05-10 DIAGNOSIS — E871 Hypo-osmolality and hyponatremia: Secondary | ICD-10-CM

## 2016-05-10 DIAGNOSIS — R350 Frequency of micturition: Secondary | ICD-10-CM

## 2016-05-10 DIAGNOSIS — E039 Hypothyroidism, unspecified: Secondary | ICD-10-CM | POA: Diagnosis not present

## 2016-05-10 DIAGNOSIS — D696 Thrombocytopenia, unspecified: Secondary | ICD-10-CM

## 2016-05-10 DIAGNOSIS — R630 Anorexia: Secondary | ICD-10-CM | POA: Diagnosis not present

## 2016-05-10 DIAGNOSIS — S069X2S Unspecified intracranial injury with loss of consciousness of 31 minutes to 59 minutes, sequela: Secondary | ICD-10-CM

## 2016-05-10 DIAGNOSIS — Z95810 Presence of automatic (implantable) cardiac defibrillator: Secondary | ICD-10-CM | POA: Insufficient documentation

## 2016-05-10 DIAGNOSIS — Z8674 Personal history of sudden cardiac arrest: Secondary | ICD-10-CM | POA: Diagnosis not present

## 2016-05-10 DIAGNOSIS — I10 Essential (primary) hypertension: Secondary | ICD-10-CM | POA: Diagnosis not present

## 2016-05-10 DIAGNOSIS — J449 Chronic obstructive pulmonary disease, unspecified: Secondary | ICD-10-CM | POA: Insufficient documentation

## 2016-05-10 DIAGNOSIS — S6292XS Unspecified fracture of left wrist and hand, sequela: Secondary | ICD-10-CM

## 2016-05-10 DIAGNOSIS — K5901 Slow transit constipation: Secondary | ICD-10-CM

## 2016-05-10 DIAGNOSIS — Z5189 Encounter for other specified aftercare: Secondary | ICD-10-CM | POA: Insufficient documentation

## 2016-05-10 DIAGNOSIS — I951 Orthostatic hypotension: Secondary | ICD-10-CM

## 2016-05-10 MED ORDER — CARVEDILOL 3.125 MG PO TABS: ORAL_TABLET | ORAL | Status: DC
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
## 2016-05-10 NOTE — Telephone Encounter (Signed)
Per SN---  miralax 1 capful in water BID Senokot s 2 po qhs Dulcolax supp prn  thanks

## 2016-05-10 NOTE — Telephone Encounter (Signed)
New message      Pt c/o medication issue:  1. Name of Medication: carvedilol 2. How are you currently taking this medication (dosage and times per day)? 3.125mg  3. Are you having a reaction (difficulty breathing--STAT)? no 4. What is your medication issue?  Calling to see if pt is to continue taking medication?  He is out of medication and there are no refills on the bottle

## 2016-05-10 NOTE — Telephone Encounter (Signed)
Hello Dr. Lenna Gilford,  I have ordered follow up labs for the patient after his discharge from CIR.  Please follow up with pt's labs as pt states that he was told PCP would be managing electrolytes and hypothyroid meds.  If this is not the case, please let me know and I will make a referral to Endo.  Thank you, Delice Lesch, MD

## 2016-05-10 NOTE — Telephone Encounter (Signed)
Returned call to patient, his daughter answered as pt was with PT. Asked patient for permission to speak with [name], pt's daughter and he said that was fine. Encouraged them to fill out DPR form at next office visit. Patient needs refill for Coreg. Patient needs office visit as it has been a year since his appt. He has appt with [name] on 05/16/16. Rx sent for 30 day supply until he is seen next week. She verbalized understanding and was appreciative and will ask for additional refill at upcoming appt.

## 2016-05-10 NOTE — Telephone Encounter (Signed)
LVM for pt to return call

## 2016-05-11 ENCOUNTER — Telehealth: Payer: Self-pay | Admitting: Cardiology

## 2016-05-11 NOTE — Telephone Encounter (Signed)
New Message  [facility] home health nurse wanted RN to be aware of patients decreased mobility- stated that his endurance w/ ambulatory movement has decreased over the week- and he c/o weakness and SOB. Wanted RN to follow up w/ pt. Please call back and discuss.    Pt c/o Shortness Of Breath: STAT if SOB developed within the last 24 hours or pt is noticeably SOB on the phone  1. Are you currently SOB (can you hear that pt is SOB on the phone)?    2. How long have you been experiencing SOB? More than a week   3. Are you SOB when sitting or when up moving around? Moving around  4. Are you currently experiencing any other symptoms? weakness  [place]- [phone]

## 2016-05-11 NOTE — Telephone Encounter (Signed)
Returning your call. °

## 2016-05-11 NOTE — Telephone Encounter (Signed)
Left message for pt to call, he has an appointment Wednesday next week with np.

## 2016-05-11 NOTE — Telephone Encounter (Signed)
Spoke with pt dtr, she reports the pt is getting weaker and is making no progress in PT. All of his vital signs today were normal. dtr wonders if sodium is lower than what it was at discharge. The rehab md ordered labs but they were unable to get blood and are returning tomorrow to try again. They are aware of f/u appointment Wednesday next week. They will call prior to appt with concerns.

## 2016-05-11 NOTE — Telephone Encounter (Signed)
Called spoke with [place]. Reviewed results and recs. Pt voiced understanding and had no further questions.

## 2016-05-12 DIAGNOSIS — D696 Thrombocytopenia, unspecified: Secondary | ICD-10-CM | POA: Diagnosis not present

## 2016-05-12 DIAGNOSIS — E089 Diabetes mellitus due to underlying condition without complications: Secondary | ICD-10-CM | POA: Diagnosis not present

## 2016-05-12 DIAGNOSIS — E871 Hypo-osmolality and hyponatremia: Secondary | ICD-10-CM | POA: Diagnosis not present

## 2016-05-13 ENCOUNTER — Encounter: Payer: Self-pay | Admitting: Pulmonary Disease

## 2016-05-14 ENCOUNTER — Telehealth: Payer: Self-pay | Admitting: Pulmonary Disease

## 2016-05-14 MED ORDER — LEVOTHYROXINE SODIUM 25 MCG PO TABS: 25.0000 ug | ORAL_TABLET | Freq: Every day | ORAL | 5 refills | Status: DC

## 2016-05-14 MED ORDER — OXYBUTYNIN CHLORIDE 5 MG PO TABS: 5.0000 mg | ORAL_TABLET | Freq: Two times a day (BID) | ORAL | 5 refills | Status: DC

## 2016-05-14 MED ORDER — CLOPIDOGREL BISULFATE 75 MG PO TABS
75.0000 mg | ORAL_TABLET | Freq: Every day | ORAL | 5 refills | Status: DC
Start: 2016-05-14 — End: 2016-05-16

## 2016-05-14 MED ORDER — FLUTICASONE-SALMETEROL 250-50 MCG/DOSE IN AEPB: 1.0000 | INHALATION_SPRAY | Freq: Two times a day (BID) | RESPIRATORY_TRACT | 5 refills | Status: DC

## 2016-05-14 MED ORDER — AMIODARONE HCL 200 MG PO TABS: 200.0000 mg | ORAL_TABLET | Freq: Every day | ORAL | 5 refills | Status: DC

## 2016-05-14 NOTE — Telephone Encounter (Signed)
Pt daughter calling back to speak to nurse about father's health and care  [phone].Hillery Hunter

## 2016-05-14 NOTE — Telephone Encounter (Signed)
Per SN---  He might feel more at ease with 24/7 care at a rehab facility rather than just part time coverage at home.  They should review [email or web address]  Can consider: [facility]  They should call first to check availability and then they will need to go and see the facility, then find out what all paper work will need to be done.  thanks

## 2016-05-14 NOTE — Telephone Encounter (Signed)
Called spoke with pt's daughter [name]. She states that her and her sister have been interviewing skilled at home nurses to assist with his care while they leave for [place] for 10 days for her niece's wedding. She states that her sister is leaving on 05/16/16 and her father feels that it might be better if he was referred to a rehab faculty for these 10 days. She is requesting a message be sent to [facility] for his recs. I explained to her that I would send the message today. She voiced understanding and had no further questions.   SN please advise

## 2016-05-14 NOTE — Telephone Encounter (Signed)
Spoke with pt's daughter,Cindy regarding Dr Jeannine Kitten recommendations.  She states that they have discussed things in detail with pt and reassured him that they will have qualified around the clock caregivers that will be there with him and he feels better about staying home now. They are also concerned about labs that were drawn by home health and wanted Dr Lenna Gilford to review them.  I have attached a copy with this message.

## 2016-05-14 NOTE — Telephone Encounter (Signed)
7.24.17 mychart message from pt: Message   Dr. Lenna Gilford --  This is Demitrios's daughter, [name]. I will be leaving for about a week to be in [place] for my step-daughter's wedding and am trying to be sure medications are in order during my absence. The following medications were prescribed upon hospital discharge with no refills. I suspect that these need to be continued at the end of our current supply, so can you please let us know and order refills where necessary. The medications in question are:  Clopidogrel, Levothyroxine, Oxybutynin and Amiodarone.   He's also using Advair and will need a refill for that but can't find anything with the refill information.  Thank you for your help.  Cindy   Last ov 7.14.17 w/ SN: Patient Instructions    Today we updated your med list in our EPIC system...    Continue your current medications the same...    Except we are changing your SIMVASTATIN to PRAVASTATIN 40mg - take one tab at bedtime...  Today we checked a follow up CXR-     We will contact you w/ the results when available...   We reviewed the recent blood work done by your [facility] home [facility]    We will ask them to repeat the tests in 2wks...  Call for any questions or if we can be of service in any way...  Let's plan a follow up visit in 1 month, sooner if needed for problems...    Wednesday June 06, 2016 at 11:00 [facility] is the only pharmacy on file for pt Refills sent, with the addition of the [place] E-mail sent to pt's daughter [name] to let her know Nothing further needed at this time; will sign off

## 2016-05-15 ENCOUNTER — Telehealth: Payer: Self-pay | Admitting: Pulmonary Disease

## 2016-05-15 DIAGNOSIS — S62309B Unspecified fracture of unspecified metacarpal bone, initial encounter for open fracture: Secondary | ICD-10-CM | POA: Diagnosis not present

## 2016-05-15 DIAGNOSIS — S62399A Other fracture of unspecified metacarpal bone, initial encounter for closed fracture: Secondary | ICD-10-CM | POA: Diagnosis not present

## 2016-05-15 DIAGNOSIS — S62321D Displaced fracture of shaft of second metacarpal bone, left hand, subsequent encounter for fracture with routine healing: Secondary | ICD-10-CM | POA: Diagnosis not present

## 2016-05-15 DIAGNOSIS — M25441 Effusion, right hand: Secondary | ICD-10-CM | POA: Diagnosis not present

## 2016-05-15 DIAGNOSIS — M6281 Muscle weakness (generalized): Secondary | ICD-10-CM | POA: Diagnosis not present

## 2016-05-15 NOTE — Telephone Encounter (Signed)
Called and spoke with Zigmund Daniel from [facility] and she is aware of SN recs for the lab orders and that these will need to be called to [facility] for results.  Zigmund Daniel voiced her understanding.  She wanted to let SN know that when the pt was last DC from the hospital, the pts was on strict fluid intake of 1500 cc daily.  Did SN want to increase his intake to 2000cc daily or drop it back to 1500cc daily.  Please advise. Thanks

## 2016-05-15 NOTE — Telephone Encounter (Signed)
pts daughter calling again. [phone] calling regarding his labwork. Her name is cindy.

## 2016-05-15 NOTE — Telephone Encounter (Signed)
Pls call patients daughter back. Said she is in a place where she can understand what shes being told now

## 2016-05-15 NOTE — Telephone Encounter (Signed)
Called spoke with pt's daughter. She states she is driving and will call back shortly for results.

## 2016-05-15 NOTE — Telephone Encounter (Signed)
Per SN---  Dr. Posey Pronto is the Rehab doctor.   The labs have been reviewed by SN with the following results:  Low sodium and he is not currently on a diuretic Needs to have a stat urine sodium test done through [facility] and the results need to be called to SN.  For now restrict his fluid intake to 2000cc daily---they will need to measure out all of his liquids and not go over the 2000cc daily.   Thyroid is ok All future labs that are done on this pt will need to be called to SN ASAP.  We will need to repeat his BMP on 7/31 and call these results to SN.  thanks

## 2016-05-15 NOTE — Telephone Encounter (Signed)
Per SN---  Family and home care will need to  Make sure that the pt is not taking in more that 1500-2000cc per day.  This needs to be closely monitored.

## 2016-05-15 NOTE — Telephone Encounter (Signed)
Called and spoke with pts daughter and she is aware of SN recs.  She stated that she will pass this information along to the home health that they need to call SN with all results.

## 2016-05-15 NOTE — Telephone Encounter (Signed)
pls call kay with [facility] back [phone]

## 2016-05-15 NOTE — Telephone Encounter (Signed)
[place] Slider with [facility] and received after hours service. Will need to call back in AM

## 2016-05-16 ENCOUNTER — Ambulatory Visit (INDEPENDENT_AMBULATORY_CARE_PROVIDER_SITE_OTHER): Payer: Medicare Other | Admitting: Nurse Practitioner

## 2016-05-16 ENCOUNTER — Encounter: Payer: Self-pay | Admitting: Nurse Practitioner

## 2016-05-16 ENCOUNTER — Ambulatory Visit: Payer: BLUE CROSS/BLUE SHIELD | Admitting: Endocrinology

## 2016-05-16 VITALS — BP 98/58 | HR 63 | Ht 71.0 in | Wt 174.0 lb

## 2016-05-16 DIAGNOSIS — E871 Hypo-osmolality and hyponatremia: Secondary | ICD-10-CM

## 2016-05-16 DIAGNOSIS — I472 Ventricular tachycardia, unspecified: Secondary | ICD-10-CM

## 2016-05-16 DIAGNOSIS — Z9861 Coronary angioplasty status: Secondary | ICD-10-CM

## 2016-05-16 DIAGNOSIS — I255 Ischemic cardiomyopathy: Secondary | ICD-10-CM

## 2016-05-16 DIAGNOSIS — R8290 Unspecified abnormal findings in urine: Secondary | ICD-10-CM | POA: Diagnosis not present

## 2016-05-16 DIAGNOSIS — I2510 Atherosclerotic heart disease of native coronary artery without angina pectoris: Secondary | ICD-10-CM | POA: Diagnosis not present

## 2016-05-16 DIAGNOSIS — I5042 Chronic combined systolic (congestive) and diastolic (congestive) heart failure: Secondary | ICD-10-CM | POA: Diagnosis not present

## 2016-05-16 LAB — BASIC METABOLIC PANEL
BUN: 16 mg/dL (ref 7–25)
CALCIUM: 9.5 mg/dL (ref 8.6–10.3)
CHLORIDE: 92 mmol/L — AB (ref 98–110)
CO2: 25 mmol/L (ref 20–31)
Creat: 1.07 mg/dL (ref 0.70–1.11)
GLUCOSE: 93 mg/dL (ref 65–99)
Potassium: 5.1 mmol/L (ref 3.5–5.3)
SODIUM: 127 mmol/L — AB (ref 135–146)

## 2016-05-16 MED ORDER — CARVEDILOL 3.125 MG PO TABS: 3.1250 mg | ORAL_TABLET | Freq: Two times a day (BID) | ORAL | 11 refills | Status: DC
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
## 2016-05-16 NOTE — Telephone Encounter (Signed)
Spoke with Maudie Mercury at [facility], aware of rec's per SN. Nothing further needed.

## 2016-05-16 NOTE — Patient Instructions (Signed)
Medication Instructions: [name], NP, recommends that you continue on your current medications as directed. Please refer to the Current Medication list given to you today.  Labwork: Your physician recommends that you return for lab work TODAY.  Testing/Procedures: NONE ORDERED  Follow-up: Gerald Stabs recommends that you schedule a follow-up appointment in the device clinic at [facility] within the next 10 days.  Gerald Stabs recommends that you schedule a follow-up appointment in 1 month with Dr Percival Spanish.  If you need a refill on your cardiac medications before your next appointment, please call your pharmacy.

## 2016-05-16 NOTE — Telephone Encounter (Signed)
LMTCB for [employer] @ [facility].

## 2016-05-17 ENCOUNTER — Telehealth: Payer: Self-pay | Admitting: Pulmonary Disease

## 2016-05-17 NOTE — Telephone Encounter (Signed)
Per SN---  Thanks  As we discussed---1500 to 2000 cc fluid restriction and he will need help with this.  Recheck BMP that has already been ordered for 7/31 and call these results to Dr. Lenna Gilford.  thanks

## 2016-05-17 NOTE — Telephone Encounter (Signed)
Called spoke with Zigmund Daniel @ [facility] at [facility] as stated below [place] voiced her understanding and denied any questions/concerns Nothing further needed; will sign off

## 2016-05-17 NOTE — Telephone Encounter (Signed)
Received call report from [facility] w/ [facility] who reported SN ordered a urine sodium = 39.  Report is to be faxed. (not yet received)  Will route to SN

## 2016-05-21 ENCOUNTER — Ambulatory Visit (INDEPENDENT_AMBULATORY_CARE_PROVIDER_SITE_OTHER): Payer: Medicare Other | Admitting: *Deleted

## 2016-05-21 ENCOUNTER — Encounter: Payer: Self-pay | Admitting: Internal Medicine

## 2016-05-21 DIAGNOSIS — I472 Ventricular tachycardia, unspecified: Secondary | ICD-10-CM

## 2016-05-21 DIAGNOSIS — I255 Ischemic cardiomyopathy: Secondary | ICD-10-CM

## 2016-05-21 LAB — CUP PACEART INCLINIC DEVICE CHECK
Battery Voltage: 3.13 V
Brady Statistic AP VP Percent: 0.04 %
Brady Statistic AP VS Percent: 30.68 %
Brady Statistic AS VP Percent: 0.05 %
Brady Statistic AS VS Percent: 69.22 %
Date Time Interrogation Session: 20170731145015
HighPow Impedance: 42 Ohm
HighPow Impedance: 57 Ohm
Implantable Lead Implant Date: 20090623
Implantable Lead Location: 753859
Lead Channel Impedance Value: 418 Ohm
Lead Channel Pacing Threshold Amplitude: 1.25 V
Lead Channel Pacing Threshold Pulse Width: 0.4 ms
Lead Channel Pacing Threshold Pulse Width: 0.4 ms
Lead Channel Sensing Intrinsic Amplitude: 6.125 mV
Lead Channel Setting Pacing Amplitude: 2 V
Lead Channel Setting Pacing Amplitude: 2.5 V
Lead Channel Setting Pacing Pulse Width: 0.4 ms
Lead Channel Setting Sensing Sensitivity: 0.6 mV
MDC IDC LEAD IMPLANT DT: 20090623
MDC IDC LEAD LOCATION: 753860
MDC IDC MSMT BATTERY REMAINING LONGEVITY: 126 mo
MDC IDC MSMT LEADCHNL RA PACING THRESHOLD AMPLITUDE: 1 V
MDC IDC MSMT LEADCHNL RA SENSING INTR AMPL: 1.875 mV
MDC IDC MSMT LEADCHNL RV IMPEDANCE VALUE: 285 Ohm
MDC IDC MSMT LEADCHNL RV IMPEDANCE VALUE: 361 Ohm
MDC IDC STAT BRADY RA PERCENT PACED: 30.72 %
MDC IDC STAT BRADY RV PERCENT PACED: 0.09 %

## 2016-05-21 NOTE — Progress Notes (Signed)
Wound check appointment, s/p generator changeout on 04/06/16. Dermabond previously removed by patient. Stitch removed from left lateral incision. Wound without redness or edema. Incision edges approximated, wound well healed. Normal device function. Thresholds, sensing, and impedances consistent with implant measurements. Device programmed at chronic outputs; RA min output increased to 2.0V. Histogram distribution appropriate for patient and level of activity. No mode switches or ventricular arrhythmias noted. Wavelet template updated today. Patient educated about wound care, arm mobility, shock plan. ROV with SK on 07/10/16.

## 2016-05-22 DIAGNOSIS — Z79899 Other long term (current) drug therapy: Secondary | ICD-10-CM | POA: Diagnosis not present

## 2016-05-24 DIAGNOSIS — S62303D Unspecified fracture of third metacarpal bone, left hand, subsequent encounter for fracture with routine healing: Secondary | ICD-10-CM

## 2016-05-24 DIAGNOSIS — S62301D Unspecified fracture of second metacarpal bone, left hand, subsequent encounter for fracture with routine healing: Secondary | ICD-10-CM

## 2016-05-24 DIAGNOSIS — I110 Hypertensive heart disease with heart failure: Secondary | ICD-10-CM | POA: Diagnosis not present

## 2016-05-24 DIAGNOSIS — S065X0D Traumatic subdural hemorrhage without loss of consciousness, subsequent encounter: Secondary | ICD-10-CM | POA: Diagnosis not present

## 2016-05-24 DIAGNOSIS — I5043 Acute on chronic combined systolic (congestive) and diastolic (congestive) heart failure: Secondary | ICD-10-CM | POA: Diagnosis not present

## 2016-05-24 DIAGNOSIS — Z48812 Encounter for surgical aftercare following surgery on the circulatory system: Secondary | ICD-10-CM | POA: Diagnosis not present

## 2016-05-29 ENCOUNTER — Encounter: Payer: Self-pay | Admitting: Pulmonary Disease

## 2016-05-31 DIAGNOSIS — M79675 Pain in left toe(s): Secondary | ICD-10-CM | POA: Diagnosis not present

## 2016-06-04 DIAGNOSIS — Z48812 Encounter for surgical aftercare following surgery on the circulatory system: Secondary | ICD-10-CM | POA: Diagnosis not present

## 2016-06-04 DIAGNOSIS — S62301D Unspecified fracture of second metacarpal bone, left hand, subsequent encounter for fracture with routine healing: Secondary | ICD-10-CM

## 2016-06-04 DIAGNOSIS — I110 Hypertensive heart disease with heart failure: Secondary | ICD-10-CM | POA: Diagnosis not present

## 2016-06-04 DIAGNOSIS — S62303D Unspecified fracture of third metacarpal bone, left hand, subsequent encounter for fracture with routine healing: Secondary | ICD-10-CM

## 2016-06-04 DIAGNOSIS — S065X0D Traumatic subdural hemorrhage without loss of consciousness, subsequent encounter: Secondary | ICD-10-CM | POA: Diagnosis not present

## 2016-06-04 DIAGNOSIS — I5043 Acute on chronic combined systolic (congestive) and diastolic (congestive) heart failure: Secondary | ICD-10-CM | POA: Diagnosis not present

## 2016-06-06 ENCOUNTER — Other Ambulatory Visit (INDEPENDENT_AMBULATORY_CARE_PROVIDER_SITE_OTHER): Payer: Medicare Other

## 2016-06-06 ENCOUNTER — Ambulatory Visit (INDEPENDENT_AMBULATORY_CARE_PROVIDER_SITE_OTHER): Payer: Medicare Other | Admitting: Pulmonary Disease

## 2016-06-06 ENCOUNTER — Encounter: Payer: Self-pay | Admitting: Pulmonary Disease

## 2016-06-06 VITALS — BP 134/68 | HR 60 | Temp 97.7°F | Ht 71.0 in | Wt 172.2 lb

## 2016-06-06 DIAGNOSIS — J449 Chronic obstructive pulmonary disease, unspecified: Secondary | ICD-10-CM | POA: Diagnosis not present

## 2016-06-06 DIAGNOSIS — I10 Essential (primary) hypertension: Secondary | ICD-10-CM

## 2016-06-06 DIAGNOSIS — I5042 Chronic combined systolic (congestive) and diastolic (congestive) heart failure: Secondary | ICD-10-CM

## 2016-06-06 DIAGNOSIS — Z8781 Personal history of (healed) traumatic fracture: Secondary | ICD-10-CM

## 2016-06-06 DIAGNOSIS — I2510 Atherosclerotic heart disease of native coronary artery without angina pectoris: Secondary | ICD-10-CM | POA: Diagnosis not present

## 2016-06-06 DIAGNOSIS — Z967 Presence of other bone and tendon implants: Secondary | ICD-10-CM

## 2016-06-06 DIAGNOSIS — I739 Peripheral vascular disease, unspecified: Secondary | ICD-10-CM

## 2016-06-06 DIAGNOSIS — Z9889 Other specified postprocedural states: Secondary | ICD-10-CM

## 2016-06-06 DIAGNOSIS — I255 Ischemic cardiomyopathy: Secondary | ICD-10-CM | POA: Diagnosis not present

## 2016-06-06 DIAGNOSIS — R1310 Dysphagia, unspecified: Secondary | ICD-10-CM

## 2016-06-06 DIAGNOSIS — I480 Paroxysmal atrial fibrillation: Secondary | ICD-10-CM

## 2016-06-06 DIAGNOSIS — E871 Hypo-osmolality and hyponatremia: Secondary | ICD-10-CM

## 2016-06-06 LAB — CBC WITH DIFFERENTIAL/PLATELET
BASOS ABS: 0 10*3/uL (ref 0.0–0.1)
Basophils Relative: 0.4 % (ref 0.0–3.0)
EOS ABS: 0.7 10*3/uL (ref 0.0–0.7)
Eosinophils Relative: 9.7 % — ABNORMAL HIGH (ref 0.0–5.0)
HEMATOCRIT: 44.6 % (ref 39.0–52.0)
Hemoglobin: 14.9 g/dL (ref 13.0–17.0)
LYMPHS ABS: 1.9 10*3/uL (ref 0.7–4.0)
LYMPHS PCT: 25.7 % (ref 12.0–46.0)
MCHC: 33.5 g/dL (ref 30.0–36.0)
MCV: 99.6 fl (ref 78.0–100.0)
MONOS PCT: 9.1 % (ref 3.0–12.0)
Monocytes Absolute: 0.7 10*3/uL (ref 0.1–1.0)
NEUTROS PCT: 55.1 % (ref 43.0–77.0)
Neutro Abs: 4 10*3/uL (ref 1.4–7.7)
Platelets: 186 10*3/uL (ref 150.0–400.0)
RBC: 4.48 Mil/uL (ref 4.22–5.81)
RDW: 14.9 % (ref 11.5–15.5)
WBC: 7.3 10*3/uL (ref 4.0–10.5)

## 2016-06-06 LAB — BASIC METABOLIC PANEL
BUN: 11 mg/dL (ref 6–23)
CALCIUM: 10.2 mg/dL (ref 8.4–10.5)
CO2: 29 mEq/L (ref 19–32)
Chloride: 100 mEq/L (ref 96–112)
Creatinine, Ser: 0.84 mg/dL (ref 0.40–1.50)
GFR: 92.69 mL/min (ref >60.00)
Glucose, Bld: 83 mg/dL (ref 70–99)
Potassium: 4.8 mEq/L (ref 3.5–5.1)
SODIUM: 137 meq/L (ref 135–145)

## 2016-06-06 NOTE — Patient Instructions (Signed)
[place]-- it's great to see your progress, keep up the good work!    Continue your meds the same for now...  Today we rechecked your LABS...    We will contact you w/ the results when available...   We will request [place] to do a speech path eval (esp of your swallowing mechanism)...  Call for any questions or if we can be of service in any way...  Let's plan a follow up visit in about 1 month.Marland KitchenMarland Kitchen

## 2016-06-07 ENCOUNTER — Encounter: Payer: Self-pay | Admitting: Pulmonary Disease

## 2016-06-07 ENCOUNTER — Other Ambulatory Visit: Payer: Self-pay | Admitting: Physical Medicine and Rehabilitation
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
## 2016-06-13 ENCOUNTER — Ambulatory Visit (HOSPITAL_COMMUNITY)
Admission: RE | Admit: 2016-06-13 | Discharge: 2016-06-13 | Disposition: A | Discharge status: Home / Self Care | Payer: Medicare Other | Source: Ambulatory Visit | Attending: Vascular Surgery | Admitting: Vascular Surgery

## 2016-06-13 ENCOUNTER — Ambulatory Visit: Payer: Medicare Other | Admitting: Family

## 2016-06-13 DIAGNOSIS — I5042 Chronic combined systolic (congestive) and diastolic (congestive) heart failure: Secondary | ICD-10-CM | POA: Diagnosis not present

## 2016-06-13 DIAGNOSIS — Z48812 Encounter for surgical aftercare following surgery on the circulatory system: Secondary | ICD-10-CM | POA: Insufficient documentation

## 2016-06-13 DIAGNOSIS — R030 Elevated blood-pressure reading, without diagnosis of hypertension: Secondary | ICD-10-CM | POA: Diagnosis not present

## 2016-06-13 DIAGNOSIS — I2510 Atherosclerotic heart disease of native coronary artery without angina pectoris: Secondary | ICD-10-CM | POA: Diagnosis not present

## 2016-06-13 DIAGNOSIS — J449 Chronic obstructive pulmonary disease, unspecified: Secondary | ICD-10-CM | POA: Diagnosis not present

## 2016-06-13 DIAGNOSIS — IMO0001 Reserved for inherently not codable concepts without codable children: Secondary | ICD-10-CM

## 2016-06-13 DIAGNOSIS — E7800 Pure hypercholesterolemia, unspecified: Secondary | ICD-10-CM | POA: Insufficient documentation

## 2016-06-13 DIAGNOSIS — I110 Hypertensive heart disease with heart failure: Secondary | ICD-10-CM | POA: Diagnosis not present

## 2016-06-13 DIAGNOSIS — I255 Ischemic cardiomyopathy: Secondary | ICD-10-CM | POA: Diagnosis not present

## 2016-06-13 DIAGNOSIS — Z87891 Personal history of nicotine dependence: Secondary | ICD-10-CM | POA: Insufficient documentation

## 2016-06-13 DIAGNOSIS — Z9889 Other specified postprocedural states: Secondary | ICD-10-CM | POA: Diagnosis not present

## 2016-06-13 LAB — VAS US CAROTID
LCCADDIAS: 5 cm/s
LCCADSYS: 40 cm/s
LCCAPDIAS: 9 cm/s
LCCAPSYS: 91 cm/s
LEFT ECA DIAS: 10 cm/s
LICADDIAS: -17 cm/s
LICADSYS: -69 cm/s
LICAPDIAS: -18 cm/s
LICAPSYS: -106 cm/s
RCCAPSYS: 52 cm/s
RIGHT CCA MID DIAS: 7 cm/s
RIGHT ECA DIAS: -5 cm/s
Right CCA prox dias: 7 cm/s
Right cca dist sys: -79 cm/s

## 2016-06-14 ENCOUNTER — Encounter: Payer: Self-pay | Admitting: Cardiology

## 2016-06-15 ENCOUNTER — Telehealth: Payer: Self-pay | Admitting: Pulmonary Disease

## 2016-06-15 ENCOUNTER — Encounter: Payer: Self-pay | Admitting: Family

## 2016-06-15 NOTE — Telephone Encounter (Signed)
[phone], Junie Panning cb

## 2016-06-15 NOTE — Telephone Encounter (Signed)
lmotcb x 2

## 2016-06-15 NOTE — Telephone Encounter (Signed)
erin called back and she stated that she did the swallowing eval on the pt this morning.  She stated that while eating and drinking water he cleared his throat about 4 times.  She offered him and his daughter the services of coming out and doing exercises with pt to help with this and pt refused.  Will forward to SN to make him aware.   Pt did state that he would call our office if he started having  More issues.

## 2016-06-15 NOTE — Telephone Encounter (Signed)
lmomtcb x 1 for [name].

## 2016-06-18 NOTE — Telephone Encounter (Signed)
SN is aware of message and ok to sign off

## 2016-06-19 ENCOUNTER — Encounter: Payer: Self-pay | Admitting: Family

## 2016-06-19 ENCOUNTER — Ambulatory Visit: Payer: Medicare Other | Admitting: Family

## 2016-06-19 ENCOUNTER — Telehealth: Payer: Self-pay | Admitting: Cardiology

## 2016-06-19 NOTE — Telephone Encounter (Signed)
New message      [name] is calling to confirm that we received a fax of bp readings for the pt. Please call.

## 2016-06-19 NOTE — Telephone Encounter (Signed)
Faxed received, I call [name] and let her know Dr Percival Spanish is out the office until next Tuesday, she voice understanding and will call back then

## 2016-06-19 NOTE — Telephone Encounter (Signed)
Spoke with Rakey at [facility] letting her know that we did not received a fax on pt [name], [place] stated she will re-fax BP results.

## 2016-06-19 NOTE — Telephone Encounter (Signed)
Please advise 

## 2016-06-22 ENCOUNTER — Encounter: Payer: Self-pay | Admitting: Family

## 2016-06-26 ENCOUNTER — Ambulatory Visit (INDEPENDENT_AMBULATORY_CARE_PROVIDER_SITE_OTHER): Payer: Medicare Other | Admitting: Family

## 2016-06-26 ENCOUNTER — Encounter: Payer: Self-pay | Admitting: Family

## 2016-06-26 ENCOUNTER — Telehealth: Payer: Self-pay | Admitting: Cardiology

## 2016-06-26 VITALS — BP 155/88 | HR 60 | Temp 97.3°F | Resp 16 | Ht 71.0 in | Wt 164.0 lb

## 2016-06-26 DIAGNOSIS — I255 Ischemic cardiomyopathy: Secondary | ICD-10-CM | POA: Diagnosis not present

## 2016-06-26 DIAGNOSIS — I6521 Occlusion and stenosis of right carotid artery: Secondary | ICD-10-CM

## 2016-06-26 DIAGNOSIS — Z87891 Personal history of nicotine dependence: Secondary | ICD-10-CM | POA: Diagnosis not present

## 2016-06-26 DIAGNOSIS — Z9889 Other specified postprocedural states: Secondary | ICD-10-CM | POA: Diagnosis not present

## 2016-06-26 NOTE — Telephone Encounter (Signed)
Received records from [facility] for appointment with Dr Percival Spanish on 06/28/16.  Records given to [facility] (medical records) for Dr Hochrein's schedule on 06/28/16. lp
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
## 2016-06-26 NOTE — Patient Instructions (Signed)
Stroke Prevention Some medical conditions and behaviors are associated with an increased chance of having a stroke. You may prevent a stroke by making healthy choices and managing medical conditions. HOW CAN I REDUCE MY RISK OF HAVING A STROKE?   Stay physically active. Get at least 30 minutes of activity on most or all days.  Do not smoke. It may also be helpful to avoid exposure to secondhand smoke.  Limit alcohol use. Moderate alcohol use is considered to be:  No more than 2 drinks per day for men.  No more than 1 drink per day for nonpregnant women.  Eat healthy foods. This involves:  Eating 5 or more servings of fruits and vegetables a day.  Making dietary changes that address high blood pressure (hypertension), high cholesterol, diabetes, or obesity.  Manage your cholesterol levels.  Making food choices that are high in fiber and low in saturated fat, trans fat, and cholesterol may control cholesterol levels.  Take any prescribed medicines to control cholesterol as directed by your health care provider.  Manage your diabetes.  Controlling your carbohydrate and sugar intake is recommended to manage diabetes.  Take any prescribed medicines to control diabetes as directed by your health care provider.  Control your hypertension.  Making food choices that are low in salt (sodium), saturated fat, trans fat, and cholesterol is recommended to manage hypertension.  Ask your health care provider if you need treatment to lower your blood pressure. Take any prescribed medicines to control hypertension as directed by your health care provider.  If you are 18-39 years of age, have your blood pressure checked every 3-5 years. If you are 40 years of age or older, have your blood pressure checked every year.  Maintain a healthy weight.  Reducing calorie intake and making food choices that are low in sodium, saturated fat, trans fat, and cholesterol are recommended to manage  weight.  Stop drug abuse.  Avoid taking birth control pills.  Talk to your health care provider about the risks of taking birth control pills if you are over 35 years old, smoke, get migraines, or have ever had a blood clot.  Get evaluated for sleep disorders (sleep apnea).  Talk to your health care provider about getting a sleep evaluation if you snore a lot or have excessive sleepiness.  Take medicines only as directed by your health care provider.  For some people, aspirin or blood thinners (anticoagulants) are helpful in reducing the risk of forming abnormal blood clots that can lead to stroke. If you have the irregular heart rhythm of atrial fibrillation, you should be on a blood thinner unless there is a good reason you cannot take them.  Understand all your medicine instructions.  Make sure that other conditions (such as anemia or atherosclerosis) are addressed. SEEK IMMEDIATE MEDICAL CARE IF:   You have sudden weakness or numbness of the face, arm, or leg, especially on one side of the body.  Your face or eyelid droops to one side.  You have sudden confusion.  You have trouble speaking (aphasia) or understanding.  You have sudden trouble seeing in one or both eyes.  You have sudden trouble walking.  You have dizziness.  You have a loss of balance or coordination.  You have a sudden, severe headache with no known cause.  You have new chest pain or an irregular heartbeat. Any of these symptoms may represent a serious problem that is an emergency. Do not wait to see if the symptoms will   go away. Get medical help at once. Call your local emergency services (911 in [place] Do not drive yourself to the hospital.   This information is not intended to replace advice given to you by your health care provider. Make sure you discuss any questions you have with your health care provider.   Document Released: 11/15/2004 Document Revised: 10/29/2014 Document Reviewed:  04/10/2013 Elsevier Interactive Patient Education 2016 Elsevier Inc.  
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
## 2016-06-28 ENCOUNTER — Ambulatory Visit (INDEPENDENT_AMBULATORY_CARE_PROVIDER_SITE_OTHER): Payer: Medicare Other | Admitting: Cardiology

## 2016-06-28 ENCOUNTER — Encounter: Payer: Self-pay | Admitting: Cardiology

## 2016-06-28 ENCOUNTER — Telehealth: Payer: Self-pay | Admitting: Pulmonary Disease

## 2016-06-28 VITALS — BP 130/72 | HR 66 | Ht 71.0 in | Wt 166.8 lb

## 2016-06-28 DIAGNOSIS — I2510 Atherosclerotic heart disease of native coronary artery without angina pectoris: Secondary | ICD-10-CM | POA: Diagnosis not present

## 2016-06-28 DIAGNOSIS — I472 Ventricular tachycardia, unspecified: Secondary | ICD-10-CM

## 2016-06-28 DIAGNOSIS — I110 Hypertensive heart disease with heart failure: Secondary | ICD-10-CM | POA: Diagnosis not present

## 2016-06-28 DIAGNOSIS — Z79899 Other long term (current) drug therapy: Secondary | ICD-10-CM | POA: Diagnosis not present

## 2016-06-28 DIAGNOSIS — S62301D Unspecified fracture of second metacarpal bone, left hand, subsequent encounter for fracture with routine healing: Secondary | ICD-10-CM | POA: Diagnosis not present

## 2016-06-28 DIAGNOSIS — I951 Orthostatic hypotension: Secondary | ICD-10-CM | POA: Diagnosis not present

## 2016-06-28 DIAGNOSIS — I255 Ischemic cardiomyopathy: Secondary | ICD-10-CM | POA: Diagnosis not present

## 2016-06-28 DIAGNOSIS — S62303D Unspecified fracture of third metacarpal bone, left hand, subsequent encounter for fracture with routine healing: Secondary | ICD-10-CM | POA: Diagnosis not present

## 2016-06-28 DIAGNOSIS — I5043 Acute on chronic combined systolic (congestive) and diastolic (congestive) heart failure: Secondary | ICD-10-CM | POA: Diagnosis not present

## 2016-06-28 DIAGNOSIS — I5042 Chronic combined systolic (congestive) and diastolic (congestive) heart failure: Secondary | ICD-10-CM | POA: Diagnosis not present

## 2016-06-28 MED ORDER — LISINOPRIL 2.5 MG PO TABS: 2.5000 mg | ORAL_TABLET | Freq: Every day | ORAL | 11 refills | Status: DC

## 2016-06-28 NOTE — Telephone Encounter (Signed)
[name] at [facility] requesting recertification order for continuation of Home Health PT orders- pt to be seen 2 times weekly X4 weeks, then transfer to outpatient cardiac rehab.

## 2016-06-28 NOTE — Telephone Encounter (Signed)
lmtcb x1 for [name].  

## 2016-06-28 NOTE — Patient Instructions (Signed)
Medication Instructions:  START Lisinopril 2.5 mg daily  Labwork: BMP in 1 week  Testing/Procedures: None Ordered  Follow-Up: Your physician recommends that you schedule a follow-up appointment in: 1 Month with Lurena Joiner   Any Other Special Instructions Will Be Listed Below (If Applicable).   If you need a refill on your cardiac medications before your next appointment, please call your pharmacy.

## 2016-06-29 NOTE — Telephone Encounter (Signed)
Return call from [place].Hillery Hunter

## 2016-06-29 NOTE — Telephone Encounter (Signed)
Spoke with [employer] I have given her the verbal order that was needed. Message will be closed at this time.

## 2016-07-02 DIAGNOSIS — I2510 Atherosclerotic heart disease of native coronary artery without angina pectoris: Secondary | ICD-10-CM | POA: Diagnosis not present

## 2016-07-02 DIAGNOSIS — I951 Orthostatic hypotension: Secondary | ICD-10-CM | POA: Diagnosis not present

## 2016-07-02 DIAGNOSIS — I5043 Acute on chronic combined systolic (congestive) and diastolic (congestive) heart failure: Secondary | ICD-10-CM | POA: Diagnosis not present

## 2016-07-02 DIAGNOSIS — I110 Hypertensive heart disease with heart failure: Secondary | ICD-10-CM | POA: Diagnosis not present

## 2016-07-02 DIAGNOSIS — S62301D Unspecified fracture of second metacarpal bone, left hand, subsequent encounter for fracture with routine healing: Secondary | ICD-10-CM | POA: Diagnosis not present

## 2016-07-02 DIAGNOSIS — S62303D Unspecified fracture of third metacarpal bone, left hand, subsequent encounter for fracture with routine healing: Secondary | ICD-10-CM | POA: Diagnosis not present

## 2016-07-02 NOTE — Telephone Encounter (Signed)
Dr hochrein reviewed pt blood pressure pt work no changes was made, leave message to call back

## 2016-07-04 DIAGNOSIS — I5043 Acute on chronic combined systolic (congestive) and diastolic (congestive) heart failure: Secondary | ICD-10-CM | POA: Diagnosis not present

## 2016-07-04 DIAGNOSIS — S62301D Unspecified fracture of second metacarpal bone, left hand, subsequent encounter for fracture with routine healing: Secondary | ICD-10-CM | POA: Diagnosis not present

## 2016-07-04 DIAGNOSIS — I2510 Atherosclerotic heart disease of native coronary artery without angina pectoris: Secondary | ICD-10-CM | POA: Diagnosis not present

## 2016-07-04 DIAGNOSIS — I951 Orthostatic hypotension: Secondary | ICD-10-CM | POA: Diagnosis not present

## 2016-07-04 DIAGNOSIS — I110 Hypertensive heart disease with heart failure: Secondary | ICD-10-CM | POA: Diagnosis not present

## 2016-07-04 DIAGNOSIS — S62303D Unspecified fracture of third metacarpal bone, left hand, subsequent encounter for fracture with routine healing: Secondary | ICD-10-CM | POA: Diagnosis not present

## 2016-07-05 ENCOUNTER — Telehealth: Payer: Self-pay | Admitting: Pulmonary Disease

## 2016-07-05 DIAGNOSIS — I5043 Acute on chronic combined systolic (congestive) and diastolic (congestive) heart failure: Secondary | ICD-10-CM | POA: Diagnosis not present

## 2016-07-05 DIAGNOSIS — R0989 Other specified symptoms and signs involving the circulatory and respiratory systems: Secondary | ICD-10-CM

## 2016-07-05 DIAGNOSIS — S62303D Unspecified fracture of third metacarpal bone, left hand, subsequent encounter for fracture with routine healing: Secondary | ICD-10-CM | POA: Diagnosis not present

## 2016-07-05 DIAGNOSIS — I110 Hypertensive heart disease with heart failure: Secondary | ICD-10-CM | POA: Diagnosis not present

## 2016-07-05 DIAGNOSIS — M79675 Pain in left toe(s): Secondary | ICD-10-CM

## 2016-07-05 DIAGNOSIS — Z79899 Other long term (current) drug therapy: Secondary | ICD-10-CM | POA: Diagnosis not present

## 2016-07-05 DIAGNOSIS — I2510 Atherosclerotic heart disease of native coronary artery without angina pectoris: Secondary | ICD-10-CM | POA: Diagnosis not present

## 2016-07-05 DIAGNOSIS — I951 Orthostatic hypotension: Secondary | ICD-10-CM | POA: Diagnosis not present

## 2016-07-05 DIAGNOSIS — S62301D Unspecified fracture of second metacarpal bone, left hand, subsequent encounter for fracture with routine healing: Secondary | ICD-10-CM | POA: Diagnosis not present

## 2016-07-05 NOTE — Telephone Encounter (Signed)
Per SN---  He's had carotid surgery and dopplers regularly, but not of his legs.  Please schedule for the pt  Dopplers LE's with ABI's. thanks

## 2016-07-05 NOTE — Telephone Encounter (Signed)
Spoke with [employer]. She is aware of SN's recommendation. Order has been placed. Nothing further was needed.

## 2016-07-05 NOTE — Telephone Encounter (Signed)
[name] from [facility] called. She is doing cardiac pulm rehab w/ patient.  Pt R 3rd toe is red/swollen/painful. 1/2 cm purple area on that toe. Pt pulse on R foot is little more deminished than the pulse on the left foot. Requesting recs. Please advise Dr. Lenna Gilford thanks  Allergies  Allergen Reactions  . Lisinopril     BP dropped too low per daughter     Current Outpatient Prescriptions on File Prior to Visit  Medication Sig Dispense Refill  . allopurinol (ZYLOPRIM) 100 MG tablet   4  . amiodarone (PACERONE) 200 MG tablet Take 1 tablet (200 mg total) by mouth daily. 30 tablet 5  . carvedilol (COREG) 3.125 MG tablet Take 1 tablet (3.125 mg total) by mouth 2 (two) times daily with a meal. 60 tablet 11  . cholecalciferol (VITAMIN D) 1000 units tablet Take 1,000 Units by mouth daily.    . clopidogrel (PLAVIX) 75 MG tablet Take 1 tablet by mouth daily.  4  . Fluticasone-Salmeterol (ADVAIR DISKUS) 250-50 MCG/DOSE AEPB Inhale 1 puff into the lungs 2 (two) times daily. 60 each 5  . levothyroxine (SYNTHROID, LEVOTHROID) 25 MCG tablet Take 1 tablet (25 mcg total) by mouth daily before breakfast. 30 tablet 5  . lisinopril (PRINIVIL,ZESTRIL) 2.5 MG tablet Take 1 tablet (2.5 mg total) by mouth daily. 30 tablet 11  . magnesium oxide (MAG-OX) 400 (241.3 Mg) MG tablet Take 0.5 tablets (200 mg total) by mouth daily. 30 tablet 0  . Multiple Vitamin (MULTIVITAMIN) capsule Take 1 capsule by mouth daily.    . Multiple Vitamins-Minerals (PRESERVISION/LUTEIN) CAPS Take 1 capsule by mouth daily.     Marland Kitchen oxybutynin (DITROPAN) 5 MG tablet Take 1 tablet (5 mg total) by mouth 2 (two) times daily. 60 tablet 5  . polyethylene glycol powder (GLYCOLAX/MIRALAX) powder DISSOLVE ONE CAPFUL IN 8 0Z. WATER TWICE DAILY. 527 g 0  . pravastatin (PRAVACHOL) 40 MG tablet Take 1 tablet (40 mg total) by mouth daily. 90 tablet 3  . senna-docusate (SENOKOT-S) 8.6-50 MG tablet Take 1 tablet by mouth 2 (two) times daily. 60 tablet 0   . thiamine 100 MG tablet Take 1 tablet (100 mg total) by mouth daily. 30 tablet 0  . Vitamin D, Ergocalciferol, (DRISDOL) 50000 units CAPS capsule Take 50,000 Units by mouth every 7 (seven) days.     No current facility-administered medications on file prior to visit.

## 2016-07-06 ENCOUNTER — Other Ambulatory Visit: Payer: Self-pay | Admitting: Pulmonary Disease

## 2016-07-06 ENCOUNTER — Telehealth: Payer: Self-pay | Admitting: Pulmonary Disease

## 2016-07-06 DIAGNOSIS — I739 Peripheral vascular disease, unspecified: Principal | ICD-10-CM

## 2016-07-06 DIAGNOSIS — I779 Disorder of arteries and arterioles, unspecified: Secondary | ICD-10-CM

## 2016-07-06 LAB — BASIC METABOLIC PANEL
BUN: 15 mg/dL (ref 7–25)
CHLORIDE: 100 mmol/L (ref 98–110)
CO2: 30 mmol/L (ref 20–31)
CREATININE: 0.98 mg/dL (ref 0.70–1.11)
Calcium: 9.7 mg/dL (ref 8.6–10.3)
Glucose, Bld: 69 mg/dL (ref 65–99)
POTASSIUM: 4.9 mmol/L (ref 3.5–5.3)
SODIUM: 138 mmol/L (ref 135–146)

## 2016-07-06 NOTE — Telephone Encounter (Signed)
Spoke with Felicia at [facility], states that doppler needs to be ordered under a different order name to include ABI's.  This order has been re-placed.  Nothing further needed.

## 2016-07-09 ENCOUNTER — Encounter: Payer: Self-pay | Admitting: Pulmonary Disease

## 2016-07-09 ENCOUNTER — Ambulatory Visit (INDEPENDENT_AMBULATORY_CARE_PROVIDER_SITE_OTHER): Payer: Medicare Other | Admitting: Pulmonary Disease

## 2016-07-09 VITALS — BP 126/82 | HR 76 | Temp 97.0°F | Ht 71.0 in | Wt 165.4 lb

## 2016-07-09 DIAGNOSIS — S62303D Unspecified fracture of third metacarpal bone, left hand, subsequent encounter for fracture with routine healing: Secondary | ICD-10-CM | POA: Diagnosis not present

## 2016-07-09 DIAGNOSIS — I5042 Chronic combined systolic (congestive) and diastolic (congestive) heart failure: Secondary | ICD-10-CM

## 2016-07-09 DIAGNOSIS — I10 Essential (primary) hypertension: Secondary | ICD-10-CM

## 2016-07-09 DIAGNOSIS — J449 Chronic obstructive pulmonary disease, unspecified: Secondary | ICD-10-CM

## 2016-07-09 DIAGNOSIS — I480 Paroxysmal atrial fibrillation: Secondary | ICD-10-CM

## 2016-07-09 DIAGNOSIS — I5043 Acute on chronic combined systolic (congestive) and diastolic (congestive) heart failure: Secondary | ICD-10-CM | POA: Diagnosis not present

## 2016-07-09 DIAGNOSIS — Z95810 Presence of automatic (implantable) cardiac defibrillator: Secondary | ICD-10-CM

## 2016-07-09 DIAGNOSIS — I2510 Atherosclerotic heart disease of native coronary artery without angina pectoris: Secondary | ICD-10-CM

## 2016-07-09 DIAGNOSIS — S62301D Unspecified fracture of second metacarpal bone, left hand, subsequent encounter for fracture with routine healing: Secondary | ICD-10-CM | POA: Diagnosis not present

## 2016-07-09 DIAGNOSIS — Z23 Encounter for immunization: Secondary | ICD-10-CM | POA: Diagnosis not present

## 2016-07-09 DIAGNOSIS — I255 Ischemic cardiomyopathy: Secondary | ICD-10-CM | POA: Diagnosis not present

## 2016-07-09 DIAGNOSIS — I739 Peripheral vascular disease, unspecified: Secondary | ICD-10-CM

## 2016-07-09 DIAGNOSIS — S6292XD Unspecified fracture of left wrist and hand, subsequent encounter for fracture with routine healing: Secondary | ICD-10-CM

## 2016-07-09 DIAGNOSIS — R1319 Other dysphagia: Secondary | ICD-10-CM

## 2016-07-09 DIAGNOSIS — I110 Hypertensive heart disease with heart failure: Secondary | ICD-10-CM | POA: Diagnosis not present

## 2016-07-09 DIAGNOSIS — I951 Orthostatic hypotension: Secondary | ICD-10-CM | POA: Diagnosis not present

## 2016-07-09 MED ORDER — SERTRALINE HCL 50 MG PO TABS: 50.0000 mg | ORAL_TABLET | Freq: Every day | ORAL | 5 refills | Status: DC
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
## 2016-07-09 NOTE — Patient Instructions (Signed)
Today we updated your med list in our EPIC system...    Continue your current medications the same...  We decided to give you a trial of ZOLOFT (Sertraline) 50mg  one tab in the eve before bedtime... Plan to give me a call in 3-4 weeks to let me know how you are doing on this med so we can decide about dose adjustment going forward...  Keep up the good work w/ your therapy...  We gave you the 2017 FLU vaccine today...  Call for any questions...  Let's plan a follow up visit in 105mo, sooner if needed for problems.Marland KitchenMarland Kitchen
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
## 2016-07-10 ENCOUNTER — Encounter: Payer: Self-pay | Admitting: Internal Medicine

## 2016-07-10 ENCOUNTER — Ambulatory Visit (INDEPENDENT_AMBULATORY_CARE_PROVIDER_SITE_OTHER): Payer: Medicare Other | Admitting: Internal Medicine

## 2016-07-10 ENCOUNTER — Telehealth: Payer: Self-pay | Admitting: Cardiology

## 2016-07-10 VITALS — BP 96/65 | HR 60 | Ht 71.0 in | Wt 167.0 lb

## 2016-07-10 DIAGNOSIS — S62303D Unspecified fracture of third metacarpal bone, left hand, subsequent encounter for fracture with routine healing: Secondary | ICD-10-CM | POA: Diagnosis not present

## 2016-07-10 DIAGNOSIS — I472 Ventricular tachycardia, unspecified: Secondary | ICD-10-CM

## 2016-07-10 DIAGNOSIS — Z95810 Presence of automatic (implantable) cardiac defibrillator: Secondary | ICD-10-CM

## 2016-07-10 DIAGNOSIS — S62301D Unspecified fracture of second metacarpal bone, left hand, subsequent encounter for fracture with routine healing: Secondary | ICD-10-CM | POA: Diagnosis not present

## 2016-07-10 DIAGNOSIS — Z79899 Other long term (current) drug therapy: Secondary | ICD-10-CM | POA: Diagnosis not present

## 2016-07-10 DIAGNOSIS — I951 Orthostatic hypotension: Secondary | ICD-10-CM | POA: Diagnosis not present

## 2016-07-10 DIAGNOSIS — I5043 Acute on chronic combined systolic (congestive) and diastolic (congestive) heart failure: Secondary | ICD-10-CM | POA: Diagnosis not present

## 2016-07-10 DIAGNOSIS — I2589 Other forms of chronic ischemic heart disease: Secondary | ICD-10-CM | POA: Diagnosis not present

## 2016-07-10 DIAGNOSIS — I2510 Atherosclerotic heart disease of native coronary artery without angina pectoris: Secondary | ICD-10-CM | POA: Diagnosis not present

## 2016-07-10 DIAGNOSIS — I110 Hypertensive heart disease with heart failure: Secondary | ICD-10-CM | POA: Diagnosis not present

## 2016-07-10 DIAGNOSIS — I255 Ischemic cardiomyopathy: Secondary | ICD-10-CM | POA: Diagnosis not present

## 2016-07-10 LAB — CUP PACEART INCLINIC DEVICE CHECK
Battery Remaining Longevity: 122 mo
Brady Statistic AP VP Percent: 0.04 %
Brady Statistic AP VS Percent: 68.6 %
Brady Statistic AS VS Percent: 31.36 %
HighPow Impedance: 46 Ohm
HighPow Impedance: 63 Ohm
Implantable Lead Implant Date: 20090623
Implantable Lead Location: 753859
Implantable Lead Location: 753860
Lead Channel Impedance Value: 418 Ohm
Lead Channel Impedance Value: 418 Ohm
Lead Channel Pacing Threshold Amplitude: 0.75 V
Lead Channel Pacing Threshold Pulse Width: 0.4 ms
Lead Channel Pacing Threshold Pulse Width: 0.4 ms
Lead Channel Sensing Intrinsic Amplitude: 1.75 mV
Lead Channel Sensing Intrinsic Amplitude: 1.75 mV
MDC IDC LEAD IMPLANT DT: 20090623
MDC IDC MSMT BATTERY VOLTAGE: 3.09 V
MDC IDC MSMT LEADCHNL RV IMPEDANCE VALUE: 342 Ohm
MDC IDC MSMT LEADCHNL RV PACING THRESHOLD AMPLITUDE: 1.25 V
MDC IDC MSMT LEADCHNL RV SENSING INTR AMPL: 6.25 mV
MDC IDC MSMT LEADCHNL RV SENSING INTR AMPL: 7.625 mV
MDC IDC SESS DTM: 20170919175417
MDC IDC SET LEADCHNL RA PACING AMPLITUDE: 2 V
MDC IDC SET LEADCHNL RV PACING AMPLITUDE: 2.5 V
MDC IDC SET LEADCHNL RV PACING PULSEWIDTH: 0.4 ms
MDC IDC SET LEADCHNL RV SENSING SENSITIVITY: 0.6 mV
MDC IDC STAT BRADY AS VP PERCENT: 0.01 %
MDC IDC STAT BRADY RA PERCENT PACED: 68.63 %
MDC IDC STAT BRADY RV PERCENT PACED: 0.04 %

## 2016-07-10 NOTE — Telephone Encounter (Signed)
Recent office notes faxed.

## 2016-07-10 NOTE — Telephone Encounter (Signed)
She needs a copy of the progress notes that were faxed in last week by his daughter. Please fax to [phone] Att: Zigmund Daniel

## 2016-07-10 NOTE — Patient Instructions (Signed)
Medication Instructions: - Your physician recommends that you continue on your current medications as directed. Please refer to the Current Medication list given to you today.  Labwork: - Your physician recommends that you have lab work today: CMET/ TSH  Procedures/Testing: - none  Follow-Up: - Remote monitoring is used to monitor your Pacemaker of ICD from home. This monitoring reduces the number of office visits required to check your device to one time per year. It allows [place] to keep an eye on the functioning of your device to ensure it is working properly. You are scheduled for a device check from home on 10/09/16. You may send your transmission at any time that day. If you have a wireless device, the transmission will be sent automatically. After your physician reviews your transmission, you will receive a postcard with your next transmission date.  - Your physician wants you to follow-up in: 6 months with Dr. Caryl Comes. You will receive a reminder letter in the mail two months in advance. If you don't receive a letter, please call our office to schedule the follow-up appointment.  Any Additional Special Instructions Will Be Listed Below (If Applicable).     If you need a refill on your cardiac medications before your next appointment, please call your pharmacy.

## 2016-07-11 ENCOUNTER — Ambulatory Visit: Payer: BLUE CROSS/BLUE SHIELD | Admitting: Physical Medicine & Rehabilitation

## 2016-07-11 DIAGNOSIS — I2510 Atherosclerotic heart disease of native coronary artery without angina pectoris: Secondary | ICD-10-CM | POA: Diagnosis not present

## 2016-07-11 DIAGNOSIS — I110 Hypertensive heart disease with heart failure: Secondary | ICD-10-CM | POA: Diagnosis not present

## 2016-07-11 DIAGNOSIS — I951 Orthostatic hypotension: Secondary | ICD-10-CM | POA: Diagnosis not present

## 2016-07-11 DIAGNOSIS — I5043 Acute on chronic combined systolic (congestive) and diastolic (congestive) heart failure: Secondary | ICD-10-CM | POA: Diagnosis not present

## 2016-07-11 DIAGNOSIS — S62303D Unspecified fracture of third metacarpal bone, left hand, subsequent encounter for fracture with routine healing: Secondary | ICD-10-CM | POA: Diagnosis not present

## 2016-07-11 DIAGNOSIS — S62301D Unspecified fracture of second metacarpal bone, left hand, subsequent encounter for fracture with routine healing: Secondary | ICD-10-CM | POA: Diagnosis not present

## 2016-07-11 LAB — COMPREHENSIVE METABOLIC PANEL
ALK PHOS: 72 U/L (ref 40–115)
ALT: 16 U/L (ref 9–46)
AST: 22 U/L (ref 10–35)
Albumin: 3.7 g/dL (ref 3.6–5.1)
BUN: 18 mg/dL (ref 7–25)
CHLORIDE: 103 mmol/L (ref 98–110)
CO2: 22 mmol/L (ref 20–31)
Calcium: 9.1 mg/dL (ref 8.6–10.3)
Creat: 1.06 mg/dL (ref 0.70–1.11)
GLUCOSE: 96 mg/dL (ref 65–99)
POTASSIUM: 4.6 mmol/L (ref 3.5–5.3)
Sodium: 133 mmol/L — ABNORMAL LOW (ref 135–146)
Total Bilirubin: 0.5 mg/dL (ref 0.2–1.2)
Total Protein: 6.4 g/dL (ref 6.1–8.1)

## 2016-07-11 LAB — TSH: TSH: 3.53 mIU/L (ref 0.40–4.50)

## 2016-07-12 ENCOUNTER — Inpatient Hospital Stay (HOSPITAL_COMMUNITY): Admission: RE | Admit: 2016-07-12 | Payer: Medicare Other | Source: Ambulatory Visit

## 2016-07-13 ENCOUNTER — Telehealth: Payer: Self-pay | Admitting: Cardiology

## 2016-07-13 DIAGNOSIS — S62301D Unspecified fracture of second metacarpal bone, left hand, subsequent encounter for fracture with routine healing: Secondary | ICD-10-CM | POA: Diagnosis not present

## 2016-07-13 DIAGNOSIS — I110 Hypertensive heart disease with heart failure: Secondary | ICD-10-CM | POA: Diagnosis not present

## 2016-07-13 DIAGNOSIS — I951 Orthostatic hypotension: Secondary | ICD-10-CM | POA: Diagnosis not present

## 2016-07-13 DIAGNOSIS — I5043 Acute on chronic combined systolic (congestive) and diastolic (congestive) heart failure: Secondary | ICD-10-CM | POA: Diagnosis not present

## 2016-07-13 DIAGNOSIS — S62303D Unspecified fracture of third metacarpal bone, left hand, subsequent encounter for fracture with routine healing: Secondary | ICD-10-CM | POA: Diagnosis not present

## 2016-07-13 DIAGNOSIS — I2510 Atherosclerotic heart disease of native coronary artery without angina pectoris: Secondary | ICD-10-CM | POA: Diagnosis not present

## 2016-07-13 NOTE — Telephone Encounter (Signed)
Per [place]. With [facility]    Today pt's BP has been fluctuation yesterday   102/60  Later that day  113/58    Today   173/94     Resting 130/66   Start  110/60   Rest 3 min  140/80 rebound 2 min stand test 90/54   Arville Go is wating for a call back with patient today  [phone]   Thank you!

## 2016-07-13 NOTE — Telephone Encounter (Signed)
Spoke with PT at pt's home who is reporting yesterday AM the pt c/o having a low BP 102/56.  After walking to the mailbox that afternoon he felt dizzy and BP was 113/56.  He drank 3 glasses of water and felt better.  This AM his BP was 173/94 and repeated 1 hr after his medications it was 169/87.  Today before PT BP was 130/66 HR 60.  After walking a short distance he c/o feeling tried his BP was 110/58 HR 62.  She then checked him for orthostasis  BP was 90/54 HR 62 after 2 mins of standing.  Advised to have pt take precautions with position changes, making sure to stay hydrated and to continue to take medications as listed.  Advised I will c/b once Dr Percival Spanish has reviewed with further recommendations.

## 2016-07-14 NOTE — Telephone Encounter (Signed)
If he is taking him lisinopril in the AM have him start taking it in the evening.

## 2016-07-16 ENCOUNTER — Ambulatory Visit (HOSPITAL_COMMUNITY)
Admission: RE | Admit: 2016-07-16 | Discharge: 2016-07-16 | Disposition: A | Discharge status: Home / Self Care | Payer: Medicare Other | Source: Ambulatory Visit | Attending: Pulmonary Disease | Admitting: Pulmonary Disease

## 2016-07-16 DIAGNOSIS — I739 Peripheral vascular disease, unspecified: Secondary | ICD-10-CM | POA: Diagnosis not present

## 2016-07-16 DIAGNOSIS — I10 Essential (primary) hypertension: Secondary | ICD-10-CM | POA: Insufficient documentation

## 2016-07-16 DIAGNOSIS — J449 Chronic obstructive pulmonary disease, unspecified: Secondary | ICD-10-CM | POA: Diagnosis not present

## 2016-07-16 DIAGNOSIS — E785 Hyperlipidemia, unspecified: Secondary | ICD-10-CM | POA: Insufficient documentation

## 2016-07-16 DIAGNOSIS — I2510 Atherosclerotic heart disease of native coronary artery without angina pectoris: Secondary | ICD-10-CM | POA: Diagnosis not present

## 2016-07-16 DIAGNOSIS — R0989 Other specified symptoms and signs involving the circulatory and respiratory systems: Secondary | ICD-10-CM | POA: Diagnosis not present

## 2016-07-16 NOTE — Telephone Encounter (Signed)
Advice communicated to [facility], she informs me she will follow up with patient today with instructions. Advised to call if no improvement after several days, or if new concerns.

## 2016-07-17 DIAGNOSIS — I951 Orthostatic hypotension: Secondary | ICD-10-CM | POA: Diagnosis not present

## 2016-07-17 DIAGNOSIS — S62303D Unspecified fracture of third metacarpal bone, left hand, subsequent encounter for fracture with routine healing: Secondary | ICD-10-CM | POA: Diagnosis not present

## 2016-07-17 DIAGNOSIS — I110 Hypertensive heart disease with heart failure: Secondary | ICD-10-CM | POA: Diagnosis not present

## 2016-07-17 DIAGNOSIS — S62301D Unspecified fracture of second metacarpal bone, left hand, subsequent encounter for fracture with routine healing: Secondary | ICD-10-CM | POA: Diagnosis not present

## 2016-07-17 DIAGNOSIS — I5043 Acute on chronic combined systolic (congestive) and diastolic (congestive) heart failure: Secondary | ICD-10-CM | POA: Diagnosis not present

## 2016-07-17 DIAGNOSIS — I2510 Atherosclerotic heart disease of native coronary artery without angina pectoris: Secondary | ICD-10-CM | POA: Diagnosis not present

## 2016-07-18 ENCOUNTER — Telehealth: Payer: Self-pay | Admitting: Cardiology

## 2016-07-18 DIAGNOSIS — I951 Orthostatic hypotension: Secondary | ICD-10-CM | POA: Diagnosis not present

## 2016-07-18 DIAGNOSIS — I2510 Atherosclerotic heart disease of native coronary artery without angina pectoris: Secondary | ICD-10-CM | POA: Diagnosis not present

## 2016-07-18 DIAGNOSIS — I110 Hypertensive heart disease with heart failure: Secondary | ICD-10-CM | POA: Diagnosis not present

## 2016-07-18 DIAGNOSIS — I5043 Acute on chronic combined systolic (congestive) and diastolic (congestive) heart failure: Secondary | ICD-10-CM | POA: Diagnosis not present

## 2016-07-18 DIAGNOSIS — S62303D Unspecified fracture of third metacarpal bone, left hand, subsequent encounter for fracture with routine healing: Secondary | ICD-10-CM | POA: Diagnosis not present

## 2016-07-18 DIAGNOSIS — S62301D Unspecified fracture of second metacarpal bone, left hand, subsequent encounter for fracture with routine healing: Secondary | ICD-10-CM | POA: Diagnosis not present

## 2016-07-18 NOTE — Telephone Encounter (Signed)
Spoke to PT [place] with [facility]. Reviewed encounter notes from last week. Patient had been and continues to have some problems with orthostasis. Arville Go reports at times he is symptomatic with dizziness and lightheadedness with normal range BPs, sometimes not symptomatic with low BPs (was 88/50 standing today w/o problems) Wanted to note that the patient is taking his lisinopril in evening. He has a problem with BPs fluctuating, though she notes these tend to 'rebound' in afternoon when patient checks with his own BP monitor (yesterday afternoon [id number] systolic).  She notes patient has previously had fluid restrictions and she wonders if he still needs fluid restrictions, she is concerned he is not adequately hydrating.  Also wants to know, if these problems are addressed, will patient be able to participate in Cardiac Rehab.  Aware I will route inquiries to Dr. Percival Spanish to review.

## 2016-07-18 NOTE — Telephone Encounter (Signed)
Please schedule follow up with me or an APP to discuss.

## 2016-07-18 NOTE — Telephone Encounter (Signed)
New Message   [name] from [facility] call requesting to speka with RN. [name] wanted to inform provider pt was already taking the lisinopril 2.5 at night. [name] states she would like to speak with RN to discuss outpatient cardiac rehab. Please call back to discuss

## 2016-07-19 ENCOUNTER — Encounter: Payer: Self-pay | Admitting: Cardiology

## 2016-07-19 ENCOUNTER — Telehealth: Payer: Self-pay | Admitting: Cardiology

## 2016-07-19 DIAGNOSIS — S62303D Unspecified fracture of third metacarpal bone, left hand, subsequent encounter for fracture with routine healing: Secondary | ICD-10-CM | POA: Diagnosis not present

## 2016-07-19 DIAGNOSIS — I5043 Acute on chronic combined systolic (congestive) and diastolic (congestive) heart failure: Secondary | ICD-10-CM | POA: Diagnosis not present

## 2016-07-19 DIAGNOSIS — I2510 Atherosclerotic heart disease of native coronary artery without angina pectoris: Secondary | ICD-10-CM | POA: Diagnosis not present

## 2016-07-19 DIAGNOSIS — I951 Orthostatic hypotension: Secondary | ICD-10-CM | POA: Diagnosis not present

## 2016-07-19 DIAGNOSIS — I110 Hypertensive heart disease with heart failure: Secondary | ICD-10-CM | POA: Diagnosis not present

## 2016-07-19 DIAGNOSIS — S62301D Unspecified fracture of second metacarpal bone, left hand, subsequent encounter for fracture with routine healing: Secondary | ICD-10-CM | POA: Diagnosis not present

## 2016-07-19 NOTE — Telephone Encounter (Signed)
Pt have appt with Dr Percival Spanish on 10/03 @ 2:15 pm

## 2016-07-19 NOTE — Telephone Encounter (Signed)
Closed encounter °

## 2016-07-20 DIAGNOSIS — S62301D Unspecified fracture of second metacarpal bone, left hand, subsequent encounter for fracture with routine healing: Secondary | ICD-10-CM | POA: Diagnosis not present

## 2016-07-20 DIAGNOSIS — I2510 Atherosclerotic heart disease of native coronary artery without angina pectoris: Secondary | ICD-10-CM | POA: Diagnosis not present

## 2016-07-20 DIAGNOSIS — I110 Hypertensive heart disease with heart failure: Secondary | ICD-10-CM | POA: Diagnosis not present

## 2016-07-20 DIAGNOSIS — S62303D Unspecified fracture of third metacarpal bone, left hand, subsequent encounter for fracture with routine healing: Secondary | ICD-10-CM | POA: Diagnosis not present

## 2016-07-20 DIAGNOSIS — I5043 Acute on chronic combined systolic (congestive) and diastolic (congestive) heart failure: Secondary | ICD-10-CM | POA: Diagnosis not present

## 2016-07-20 DIAGNOSIS — I951 Orthostatic hypotension: Secondary | ICD-10-CM | POA: Diagnosis not present

## 2016-07-23 DIAGNOSIS — I5043 Acute on chronic combined systolic (congestive) and diastolic (congestive) heart failure: Secondary | ICD-10-CM | POA: Diagnosis not present

## 2016-07-23 DIAGNOSIS — I951 Orthostatic hypotension: Secondary | ICD-10-CM | POA: Diagnosis not present

## 2016-07-23 DIAGNOSIS — S62301D Unspecified fracture of second metacarpal bone, left hand, subsequent encounter for fracture with routine healing: Secondary | ICD-10-CM | POA: Diagnosis not present

## 2016-07-23 DIAGNOSIS — S62303D Unspecified fracture of third metacarpal bone, left hand, subsequent encounter for fracture with routine healing: Secondary | ICD-10-CM | POA: Diagnosis not present

## 2016-07-23 DIAGNOSIS — I110 Hypertensive heart disease with heart failure: Secondary | ICD-10-CM | POA: Diagnosis not present

## 2016-07-23 DIAGNOSIS — I2510 Atherosclerotic heart disease of native coronary artery without angina pectoris: Secondary | ICD-10-CM | POA: Diagnosis not present

## 2016-07-24 ENCOUNTER — Ambulatory Visit (INDEPENDENT_AMBULATORY_CARE_PROVIDER_SITE_OTHER): Payer: Medicare Other | Admitting: Cardiology

## 2016-07-24 ENCOUNTER — Encounter: Payer: Self-pay | Admitting: Cardiology

## 2016-07-24 VITALS — BP 80/50 | HR 60 | Ht 71.0 in | Wt 166.4 lb

## 2016-07-24 DIAGNOSIS — I951 Orthostatic hypotension: Secondary | ICD-10-CM

## 2016-07-24 DIAGNOSIS — S62303D Unspecified fracture of third metacarpal bone, left hand, subsequent encounter for fracture with routine healing: Secondary | ICD-10-CM | POA: Diagnosis not present

## 2016-07-24 DIAGNOSIS — I5043 Acute on chronic combined systolic (congestive) and diastolic (congestive) heart failure: Secondary | ICD-10-CM | POA: Diagnosis not present

## 2016-07-24 DIAGNOSIS — I255 Ischemic cardiomyopathy: Secondary | ICD-10-CM | POA: Diagnosis not present

## 2016-07-24 DIAGNOSIS — I214 Non-ST elevation (NSTEMI) myocardial infarction: Secondary | ICD-10-CM | POA: Diagnosis not present

## 2016-07-24 DIAGNOSIS — I2510 Atherosclerotic heart disease of native coronary artery without angina pectoris: Secondary | ICD-10-CM | POA: Diagnosis not present

## 2016-07-24 DIAGNOSIS — S62301D Unspecified fracture of second metacarpal bone, left hand, subsequent encounter for fracture with routine healing: Secondary | ICD-10-CM | POA: Diagnosis not present

## 2016-07-24 DIAGNOSIS — I110 Hypertensive heart disease with heart failure: Secondary | ICD-10-CM | POA: Diagnosis not present

## 2016-07-24 NOTE — Patient Instructions (Addendum)
Medication Instructions:  STOP Lisinopril  Labwork: None Ordered  Testing/Procedures: None Ordered  Follow-Up: You have been referred to Cardiac Rehab  Your physician recommends that you schedule a follow-up appointment in: 1 Month with [facility].    Any Other Special Instructions Will Be Listed Below (If Applicable).     If you need a refill on your cardiac medications before your next appointment, please call your pharmacy.
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
## 2016-07-25 DIAGNOSIS — I951 Orthostatic hypotension: Secondary | ICD-10-CM | POA: Diagnosis not present

## 2016-07-25 DIAGNOSIS — S62303D Unspecified fracture of third metacarpal bone, left hand, subsequent encounter for fracture with routine healing: Secondary | ICD-10-CM | POA: Diagnosis not present

## 2016-07-25 DIAGNOSIS — I5043 Acute on chronic combined systolic (congestive) and diastolic (congestive) heart failure: Secondary | ICD-10-CM | POA: Diagnosis not present

## 2016-07-25 DIAGNOSIS — I110 Hypertensive heart disease with heart failure: Secondary | ICD-10-CM | POA: Diagnosis not present

## 2016-07-25 DIAGNOSIS — I2510 Atherosclerotic heart disease of native coronary artery without angina pectoris: Secondary | ICD-10-CM | POA: Diagnosis not present

## 2016-07-25 DIAGNOSIS — S62301D Unspecified fracture of second metacarpal bone, left hand, subsequent encounter for fracture with routine healing: Secondary | ICD-10-CM | POA: Diagnosis not present

## 2016-07-26 ENCOUNTER — Telehealth: Payer: Self-pay

## 2016-07-26 NOTE — Telephone Encounter (Signed)
Pt is aware and agreeable with results. 

## 2016-07-27 ENCOUNTER — Other Ambulatory Visit: Payer: Self-pay | Admitting: *Deleted

## 2016-07-27 DIAGNOSIS — I110 Hypertensive heart disease with heart failure: Secondary | ICD-10-CM | POA: Diagnosis not present

## 2016-07-27 DIAGNOSIS — S62301D Unspecified fracture of second metacarpal bone, left hand, subsequent encounter for fracture with routine healing: Secondary | ICD-10-CM | POA: Diagnosis not present

## 2016-07-27 DIAGNOSIS — I5043 Acute on chronic combined systolic (congestive) and diastolic (congestive) heart failure: Secondary | ICD-10-CM | POA: Diagnosis not present

## 2016-07-27 DIAGNOSIS — I951 Orthostatic hypotension: Secondary | ICD-10-CM | POA: Diagnosis not present

## 2016-07-27 DIAGNOSIS — I2510 Atherosclerotic heart disease of native coronary artery without angina pectoris: Secondary | ICD-10-CM | POA: Diagnosis not present

## 2016-07-27 DIAGNOSIS — S62303D Unspecified fracture of third metacarpal bone, left hand, subsequent encounter for fracture with routine healing: Secondary | ICD-10-CM | POA: Diagnosis not present

## 2016-07-27 NOTE — Telephone Encounter (Signed)
[facility] left a message asking for a refill on miralax powder.  I sent an electronic message back stating that rx should be filled by PCP

## 2016-07-30 ENCOUNTER — Other Ambulatory Visit: Payer: Self-pay | Admitting: Physical Medicine and Rehabilitation

## 2016-07-31 ENCOUNTER — Telehealth: Payer: Self-pay | Admitting: Cardiology

## 2016-07-31 NOTE — Telephone Encounter (Signed)
Connie aware I have sent request to cardiac rehab to set up.

## 2016-07-31 NOTE — Telephone Encounter (Signed)
New message     Patient daughter calling wanted to know the status of cardiac rehab - have not heard anything from them.

## 2016-08-06 ENCOUNTER — Telehealth: Payer: Self-pay | Admitting: Cardiology

## 2016-08-06 NOTE — Telephone Encounter (Signed)
SPOKE TO PATIENT.  INFORMED PATIENT THAT CARDIAC REHAB IS AWARE AND SHOULD BE CALLING HIM  SOMETIME THIS WEEK ,RN THINKS.( RN  CONTACTED REHAB) PATIENT VERBALIZED UNDERSTANDING

## 2016-08-06 NOTE — Telephone Encounter (Signed)
New message      Pt has not heard from cardiac rehab.  Please call daughter and let her know the status on the order to cardiac rehab

## 2016-08-07 ENCOUNTER — Telehealth (HOSPITAL_COMMUNITY): Payer: Self-pay | Admitting: *Deleted

## 2016-08-08 ENCOUNTER — Telehealth: Payer: Self-pay | Admitting: Cardiology

## 2016-08-08 ENCOUNTER — Encounter: Payer: Self-pay | Admitting: Cardiology

## 2016-08-08 DIAGNOSIS — S62305A Unspecified fracture of fourth metacarpal bone, left hand, initial encounter for closed fracture: Secondary | ICD-10-CM | POA: Diagnosis not present

## 2016-08-08 DIAGNOSIS — M6281 Muscle weakness (generalized): Secondary | ICD-10-CM | POA: Diagnosis not present

## 2016-08-08 DIAGNOSIS — S62301B Unspecified fracture of second metacarpal bone, left hand, initial encounter for open fracture: Secondary | ICD-10-CM | POA: Diagnosis not present

## 2016-08-08 DIAGNOSIS — S62307A Unspecified fracture of fifth metacarpal bone, left hand, initial encounter for closed fracture: Secondary | ICD-10-CM | POA: Diagnosis not present

## 2016-08-08 NOTE — Telephone Encounter (Signed)
Spoke with Verdis Frederickson from cardiac rehab, she stated she needed to see where pt have a NSTEMI to start cardiac rehab, will send to [place] to f/u

## 2016-08-08 NOTE — Telephone Encounter (Addendum)
Pt's dtr calling regarding setting up cardiac rehab-having trouble with this, pls call [phone] [facility] with Cardiac rehab has spoken with dtr-pls call her at [phone] except from 1230p-115p

## 2016-08-09 ENCOUNTER — Encounter: Payer: Self-pay | Admitting: Pulmonary Disease

## 2016-08-13 DIAGNOSIS — H903 Sensorineural hearing loss, bilateral: Secondary | ICD-10-CM | POA: Diagnosis not present

## 2016-08-13 NOTE — Telephone Encounter (Signed)
Magda Kiel, RN  [place] Ro morning Nya   I was able to find documentation of the nonstemi in Mr [facility] medical records. I signed him up.    Thanks   Verdis Frederickson

## 2016-08-14 DIAGNOSIS — S62307A Unspecified fracture of fifth metacarpal bone, left hand, initial encounter for closed fracture: Secondary | ICD-10-CM | POA: Diagnosis not present

## 2016-08-14 DIAGNOSIS — M6281 Muscle weakness (generalized): Secondary | ICD-10-CM | POA: Diagnosis not present

## 2016-08-14 DIAGNOSIS — S62301B Unspecified fracture of second metacarpal bone, left hand, initial encounter for open fracture: Secondary | ICD-10-CM | POA: Diagnosis not present

## 2016-08-14 DIAGNOSIS — H903 Sensorineural hearing loss, bilateral: Secondary | ICD-10-CM | POA: Diagnosis not present

## 2016-08-14 DIAGNOSIS — S62305A Unspecified fracture of fourth metacarpal bone, left hand, initial encounter for closed fracture: Secondary | ICD-10-CM | POA: Diagnosis not present

## 2016-08-16 DIAGNOSIS — M6281 Muscle weakness (generalized): Secondary | ICD-10-CM | POA: Diagnosis not present

## 2016-08-16 DIAGNOSIS — S62305A Unspecified fracture of fourth metacarpal bone, left hand, initial encounter for closed fracture: Secondary | ICD-10-CM | POA: Diagnosis not present

## 2016-08-16 DIAGNOSIS — S62301B Unspecified fracture of second metacarpal bone, left hand, initial encounter for open fracture: Secondary | ICD-10-CM | POA: Diagnosis not present

## 2016-08-16 DIAGNOSIS — S62307A Unspecified fracture of fifth metacarpal bone, left hand, initial encounter for closed fracture: Secondary | ICD-10-CM | POA: Diagnosis not present

## 2016-08-21 DIAGNOSIS — S62301B Unspecified fracture of second metacarpal bone, left hand, initial encounter for open fracture: Secondary | ICD-10-CM | POA: Diagnosis not present

## 2016-08-21 DIAGNOSIS — S62305A Unspecified fracture of fourth metacarpal bone, left hand, initial encounter for closed fracture: Secondary | ICD-10-CM | POA: Diagnosis not present

## 2016-08-21 DIAGNOSIS — M6281 Muscle weakness (generalized): Secondary | ICD-10-CM | POA: Diagnosis not present

## 2016-08-21 DIAGNOSIS — S62307A Unspecified fracture of fifth metacarpal bone, left hand, initial encounter for closed fracture: Secondary | ICD-10-CM | POA: Diagnosis not present

## 2016-08-27 DIAGNOSIS — M6281 Muscle weakness (generalized): Secondary | ICD-10-CM | POA: Diagnosis not present

## 2016-08-27 DIAGNOSIS — S62305A Unspecified fracture of fourth metacarpal bone, left hand, initial encounter for closed fracture: Secondary | ICD-10-CM | POA: Diagnosis not present

## 2016-08-27 DIAGNOSIS — S62307A Unspecified fracture of fifth metacarpal bone, left hand, initial encounter for closed fracture: Secondary | ICD-10-CM | POA: Diagnosis not present

## 2016-08-27 DIAGNOSIS — S62301B Unspecified fracture of second metacarpal bone, left hand, initial encounter for open fracture: Secondary | ICD-10-CM | POA: Diagnosis not present

## 2016-08-30 ENCOUNTER — Encounter (HOSPITAL_COMMUNITY)
Admission: RE | Admit: 2016-08-30 | Discharge: 2016-08-30 | Disposition: A | Discharge status: Home / Self Care | Payer: Medicare Other | Source: Ambulatory Visit | Attending: Cardiology | Admitting: Cardiology

## 2016-08-30 ENCOUNTER — Encounter (HOSPITAL_COMMUNITY): Payer: Self-pay

## 2016-08-30 VITALS — BP 100/60 | HR 60 | Ht 70.0 in | Wt 162.7 lb

## 2016-08-30 DIAGNOSIS — Z5189 Encounter for other specified aftercare: Secondary | ICD-10-CM | POA: Diagnosis not present

## 2016-08-30 DIAGNOSIS — I214 Non-ST elevation (NSTEMI) myocardial infarction: Secondary | ICD-10-CM | POA: Diagnosis not present

## 2016-08-30 NOTE — Progress Notes (Signed)
Cardiac Rehab Medication Review by a Pharmacist  Does the patient  feel that his/her medications are working for him/her?  yes  Has the patient been experiencing any side effects to the medications prescribed?  no  Does the patient measure his/her own blood pressure or blood glucose at home?  no   Does the patient have any problems obtaining medications due to transportation or finances?   no  Understanding of regimen: fair Understanding of indications: fair Potential of compliance: fair   Pharmacist comments: Mr. Majid is a 82 yo male who presents to cardiac rehab today in good spirits. He does not manage his own medications, his daughter manages them and sets up a pill box for him. He stated that this works well for him. Medication history was taken from a written list that his daughter prepared for him to bring today. Patient did not express any medication-related concerns at this time.   Demetrius Charity, PharmD Acute Care Pharmacy Resident  Pager: [phone] 08/30/2016

## 2016-08-30 NOTE — Progress Notes (Signed)
Cardiac Individual Treatment Plan  Patient Details  Name: [name] MRN: [id number] Date of Birth: [birth year] Referring Provider:   Flowsheet Row CARDIAC REHAB PHASE II ORIENTATION from 08/30/2016 in [facility]  Referring Provider  Marijo File MD      Initial Encounter Date:  [place] PHASE II ORIENTATION from 08/30/2016 in [facility]  Date  08/30/16  Referring Provider  Marijo File MD      Visit Diagnosis: 03/2016 NSTEMI (non-ST elevated myocardial infarction) [facility])  Patient's Home Medications on Admission:  Current Outpatient Prescriptions:  .  allopurinol (ZYLOPRIM) 100 MG tablet, Take 100 mg by mouth daily. , Disp: , Rfl: 4 .  amiodarone (PACERONE) 200 MG tablet, Take 1 tablet (200 mg total) by mouth daily., Disp: 30 tablet, Rfl: 5 .  carvedilol (COREG) 3.125 MG tablet, Take 1 tablet (3.125 mg total) by mouth 2 (two) times daily with a meal., Disp: 60 tablet, Rfl: 11 .  cholecalciferol (VITAMIN D) 1000 units tablet, Take 1,000 Units by mouth daily., Disp: , Rfl:  .  clopidogrel (PLAVIX) 75 MG tablet, Take 1 tablet by mouth daily., Disp: , Rfl: 4 .  Fluticasone-Salmeterol (ADVAIR DISKUS) 250-50 MCG/DOSE AEPB, Inhale 1 puff into the lungs 2 (two) times daily., Disp: 60 each, Rfl: 5 .  guaiFENesin (MUCINEX) 600 MG 12 hr tablet, Take 600 mg by mouth 2 (two) times daily., Disp: , Rfl:  .  levothyroxine (SYNTHROID, LEVOTHROID) 25 MCG tablet, Take 1 tablet (25 mcg total) by mouth daily before breakfast., Disp: 30 tablet, Rfl: 5 .  magnesium oxide (MAG-OX) 400 (241.3 Mg) MG tablet, Take 0.5 tablets (200 mg total) by mouth daily., Disp: 30 tablet, Rfl: 0 .  Multiple Vitamin (MULTIVITAMIN) capsule, Take 1 capsule by mouth daily., Disp: , Rfl:  .  Multiple Vitamins-Minerals (PRESERVISION/LUTEIN) CAPS, Take 1 capsule by mouth daily. , Disp: , Rfl:  .  OXYBUTYNIN CHLORIDE PO, Take 1 tablet by mouth 2  (two) times daily. , Disp: , Rfl:  .  polyethylene glycol powder (GLYCOLAX/MIRALAX) powder, DISSOLVE ONE CAPFUL IN 8 0Z. WATER TWICE DAILY. (Patient taking differently: DISSOLVE ONE CAPFUL IN 8 0Z. WATER ONCE DAILY), Disp: 527 g, Rfl: 0 .  pravastatin (PRAVACHOL) 40 MG tablet, Take 1 tablet (40 mg total) by mouth daily., Disp: 90 tablet, Rfl: 3 .  senna-docusate (SENOKOT-S) 8.6-50 MG tablet, Take 1 tablet by mouth 2 (two) times daily., Disp: 60 tablet, Rfl: 0 .  sertraline (ZOLOFT) 50 MG tablet, Take 1 tablet (50 mg total) by mouth at bedtime., Disp: 30 tablet, Rfl: 5 .  thiamine 100 MG tablet, Take 1 tablet (100 mg total) by mouth daily., Disp: 30 tablet, Rfl: 0  Past Medical History: Past Medical History:  Diagnosis Date  . AAA (abdominal aortic aneurysm) ([place])    a. 2002 s/p repair.  . Abnormal liver function tests   . AICD (automatic cardioverter/defibrillator) present   . Allergic rhinitis   . Atherosclerotic heart disease   . Back pain   . CAD (coronary artery disease)    a. 02/2002 H/o MI with stenting x 2; b. 04/2013 MV: EF 25%, large posterior lateral infarct w/o ischemia; c. 03/2016 VT Arrest/Cath: LM 60ost, LAD 40p/m ISR, LCX 100p/m, RCA nl-->Med Rx.  . Carotid arterial disease ([place])    a. 12/2010 s/p R CEA;  b. 05/2015 Carotid U/S: bilat <40% ICA stenosis.  . Chronic combined systolic and diastolic CHF (congestive heart failure) (  [place])    a. 03/2016 Echo: Ef 35-40%, grade 1 DD.  Marland Kitchen Compression fracture   . COPD (chronic obstructive pulmonary disease) ([place])   . Coronary artery disease   . Dermatitis   . Diverticulosis of colon   . DJD (degenerative joint disease)    and Gout  . Hemorrhoids   . History of colonic polyps   . History of gout   . History of pneumonia   . Hypercholesterolemia   . Hypertension   . Hypertensive heart disease   . Ischemic cardiomyopathy    a. EF prev <35%-->improved to normal by Echo 8/14:  Mild LVH, focal basal hypertrophy, EF 60-65%, normal wall  motion, mild BAE, PASP 36; c. 03/2016 Echo: EF 35-40%, Gr1 DD, triv AI, mild MR, mod dil LA, mild-mod TR, PASP 94mmHg.  . Osteoporosis   . Peripheral vascular disease ([place])   . Presence of cardiac defibrillator    a. 03/2008 s/p MDT [id number] Maximo II DR, DC AICD; b. 03/2013: ICD shock for T wave oversensing;  c. 10/2015: collective decision not to replace ICD given improvement in LV fxn; d. VT Arrest-->Gen change to MDT ser # [id number] H.  . Skin cancer    shoulders and forehead  . Syncope   . T wave over sensing resulting in inappropriate shocks    a. 03/2013.  . Tobacco abuse   . Ventricular tachycardia ([name]) 04/01/2016    Tobacco Use: History  Smoking Status  . Former Smoker  . Types: Cigarettes, Cigars  . Quit date: 10/22/2012  Smokeless Tobacco  . Never Used    Comment: quit about 10 years ago    Labs: Recent Review Flowsheet Data    Labs for ITP Cardiac and Pulmonary Rehab Latest Ref Rng & Units 05/11/2015 03/29/2016 03/29/2016 03/30/2016 04/02/2016   Cholestrol 0 - 200 mg/dL 127 - - - -   LDLCALC 0 - 99 mg/dL 57 - - - -   LDLDIRECT mg/dL - - - - -   HDL >39.00 mg/dL 41.70 - - - -   Trlycerides 0.0 - 149.0 mg/dL 140.0 - - - -   Hemoglobin A1c 4.8 - 5.6 % - - - - 5.1   PHART 7.350 - 7.450 - - 7.283(L) 7.466(H) -   PCO2ART 35.0 - 45.0 mmHg - - 29.2(L) 25.6(L) -   HCO3 20.0 - 24.0 mEq/L - - 13.8(L) 18.2(L) -   TCO2 0 - 100 mmol/L - 16 15 19.0 -   ACIDBASEDEF 0.0 - 2.0 mmol/L - - 12.0(H) 4.9(H) -   O2SAT % - - 100.0 99.0 -      Capillary Blood Glucose: Lab Results  Component Value Date   GLUCAP 140 (H) 04/07/2016   GLUCAP 121 (H) 04/01/2016   GLUCAP 100 (H) 04/01/2016   GLUCAP 119 (H) 03/31/2016   GLUCAP 113 (H) 03/31/2016     Exercise Target Goals: Date: 08/30/16  Exercise Program Goal: Individual exercise prescription set with THRR, safety & activity barriers. Participant demonstrates ability to understand and report RPE using BORG scale, to self-measure pulse accurately,  and to acknowledge the importance of the exercise prescription.  Exercise Prescription Goal: Starting with aerobic activity 30 plus minutes a day, 3 days per week for initial exercise prescription. Provide home exercise prescription and guidelines that participant acknowledges understanding prior to discharge.  Activity Barriers & Risk Stratification:     Activity Barriers & Cardiac Risk Stratification - 08/30/16 0911      Activity Barriers & Cardiac Risk Stratification  Activity Barriers Back Problems   Cardiac Risk Stratification High      6 Minute Walk:     6 Minute Walk    Row Name 08/30/16 1251         6 Minute Walk   Phase Initial     Distance 1021 feet     Walk Time 6 minutes     # of Rest Breaks 0     MPH 1.9     METS 1.6     RPE 11     VO2 Peak 5.7     Symptoms No     Resting HR 60 bpm     Resting BP 100/60     Max Ex. HR 60 bpm     Max Ex. BP 132/74     2 Minute Post BP 132/70        Initial Exercise Prescription:     Initial Exercise Prescription - 08/30/16 1200      Date of Initial Exercise RX and Referring Provider   Date 08/30/16   Referring Provider Marijo File MD     Recumbant Bike   Level --   Minutes --   METs --     NuStep   Level 1   Minutes 10   METs 1.5     Arm Ergometer   Level 1   Minutes 10   METs 2.7     Track   Laps 8   Minutes 10   METs 2.39     Prescription Details   Frequency (times per week) 3   Duration Progress to 45 minutes of aerobic exercise without signs/symptoms of physical distress     Intensity   THRR 40-80% of Max Heartrate 55-110   Ratings of Perceived Exertion 11-13   Perceived Dyspnea 0-4     Progression   Progression Continue to progress workloads to maintain intensity without signs/symptoms of physical distress.     Resistance Training   Training Prescription Yes   Weight 2   Reps 10-12      Perform Capillary Blood Glucose checks as needed.  Exercise Prescription  Changes:   Exercise Comments:   Discharge Exercise Prescription (Final Exercise Prescription Changes):   Nutrition:  Target Goals: Understanding of nutrition guidelines, daily intake of sodium 1500mg , cholesterol 200mg , calories 30% from fat and 7% or less from saturated fats, daily to have 5 or more servings of fruits and vegetables.  Biometrics:     Pre Biometrics - 08/30/16 1553      Pre Biometrics   Waist Circumference 42.25 inches   Hip Circumference 39.5 inches   Waist to Hip Ratio 1.07 %   Triceps Skinfold 10 mm   % Body Fat 25.8 %   Grip Strength 30 kg   Flexibility 0 in   Single Leg Stand 0.5 seconds       Nutrition Therapy Plan and Nutrition Goals:   Nutrition Discharge: Nutrition Scores:   Nutrition Goals Re-Evaluation:   Psychosocial: Target Goals: Acknowledge presence or absence of depression, maximize coping skills, provide positive support system. Participant is able to verbalize types and ability to use techniques and skills needed for reducing stress and depression.  Initial Review & Psychosocial Screening:     Initial Psych Review & Screening - 08/30/16 [place]? Yes   Comments Daughter present for orientation.  Brief assessment reveals no identifiable needs, no further intervention warranted  Barriers   Psychosocial barriers to participate in program There are no identifiable barriers or psychosocial needs.      Quality of Life Scores:     Quality of Life - 08/30/16 1425      Quality of Life Scores   Health/Function Pre 17.79 %   Socioeconomic Pre 22.64 %   Psych/Spiritual Pre 18.43 %   Family Pre 24.75 %   GLOBAL Pre 19.86 %      PHQ-9: Recent Review Flowsheet Data    Depression screen [facility] 2/9 07/09/2016 05/10/2016 06/08/2013   Decreased Interest 1 3 0   Down, Depressed, Hopeless 0 2 0   PHQ - 2 Score 1 5 0   Altered sleeping - 0 -   Tired, decreased energy - 3 -   Change in  appetite - 3 -   Feeling bad or failure about yourself  - 3 -   Trouble concentrating - 0 -   Moving slowly or fidgety/restless - 0 -   Suicidal thoughts - 0 -   PHQ-9 Score - 14 -   Difficult doing work/chores - Somewhat difficult -      Psychosocial Evaluation and Intervention:     Psychosocial Evaluation - 08/30/16 1706      Discharge Psychosocial Assessment & Intervention   Discharge Continue support measures as needed      Psychosocial Re-Evaluation:   Vocational Rehabilitation: Provide vocational rehab assistance to qualifying candidates.   Vocational Rehab Evaluation & Intervention:     Vocational Rehab - 08/30/16 1706      Initial Vocational Rehab Evaluation & Intervention   Assessment shows need for Vocational Rehabilitation No  Pt does not plan to return to competitive employment.  Pt is retired.      Education: Education Goals: Education classes will be provided on a weekly basis, covering required topics. Participant will state understanding/return demonstration of topics presented.  Learning Barriers/Preferences:     Learning Barriers/Preferences - 08/30/16 0910      Learning Barriers/Preferences   Learning Barriers Sight;Hearing   Learning Preferences Written Material;Verbal Instruction;Skilled Demonstration      Education Topics: Count Your Pulse:  -Group instruction provided by verbal instruction, demonstration, patient participation and written materials to support subject.  Instructors address importance of being able to find your pulse and how to count your pulse when at home without a heart monitor.  Patients get hands on experience counting their pulse with staff help and individually.   Heart Attack, Angina, and Risk Factor Modification:  -Group instruction provided by verbal instruction, video, and written materials to support subject.  Instructors address signs and symptoms of angina and heart attacks.    Also discuss risk factors for  heart disease and how to make changes to improve heart health risk factors.   Functional Fitness:  -Group instruction provided by verbal instruction, demonstration, patient participation, and written materials to support subject.  Instructors address safety measures for doing things around the house.  Discuss how to get up and down off the floor, how to pick things up properly, how to safely get out of a chair without assistance, and balance training.   Meditation and Mindfulness:  -Group instruction provided by verbal instruction, patient participation, and written materials to support subject.  Instructor addresses importance of mindfulness and meditation practice to help reduce stress and improve awareness.  Instructor also leads participants through a meditation exercise.    Stretching for Flexibility and Mobility:  -Group instruction provided by verbal instruction, patient  participation, and written materials to support subject.  Instructors lead participants through series of stretches that are designed to increase flexibility thus improving mobility.  These stretches are additional exercise for major muscle groups that are typically performed during regular warm up and cool down.   Hands Only CPR Anytime:  -Group instruction provided by verbal instruction, video, patient participation and written materials to support subject.  Instructors co-teach with AHA video for hands only CPR.  Participants get hands on experience with mannequins.   Nutrition I class: Heart Healthy Eating:  -Group instruction provided by PowerPoint slides, verbal discussion, and written materials to support subject matter. The instructor gives an explanation and review of the Therapeutic Lifestyle Changes diet recommendations, which includes a discussion on lipid goals, dietary fat, sodium, fiber, plant stanol/sterol esters, sugar, and the components of a well-balanced, healthy diet.   Nutrition II class: Lifestyle  Skills:  -Group instruction provided by PowerPoint slides, verbal discussion, and written materials to support subject matter. The instructor gives an explanation and review of label reading, grocery shopping for heart health, heart healthy recipe modifications, and ways to make healthier choices when eating out.   Diabetes Question & Answer:  -Group instruction provided by PowerPoint slides, verbal discussion, and written materials to support subject matter. The instructor gives an explanation and review of diabetes co-morbidities, pre- and post-prandial blood glucose goals, pre-exercise blood glucose goals, signs, symptoms, and treatment of hypoglycemia and hyperglycemia, and foot care basics.   Diabetes Blitz:  -Group instruction provided by PowerPoint slides, verbal discussion, and written materials to support subject matter. The instructor gives an explanation and review of the physiology behind type 1 and type 2 diabetes, diabetes medications and rational behind using different medications, pre- and post-prandial blood glucose recommendations and Hemoglobin A1c goals, diabetes diet, and exercise including blood glucose guidelines for exercising safely.    Portion Distortion:  -Group instruction provided by PowerPoint slides, verbal discussion, written materials, and food models to support subject matter. The instructor gives an explanation of serving size versus portion size, changes in portions sizes over the last 20 years, and what consists of a serving from each food group.   Stress Management:  -Group instruction provided by verbal instruction, video, and written materials to support subject matter.  Instructors review role of stress in heart disease and how to cope with stress positively.     Exercising on Your Own:  -Group instruction provided by verbal instruction, power point, and written materials to support subject.  Instructors discuss benefits of exercise, components of  exercise, frequency and intensity of exercise, and end points for exercise.  Also discuss use of nitroglycerin and activating EMS.  Review options of places to exercise outside of rehab.  Review guidelines for sex with heart disease.   Cardiac Drugs I:  -Group instruction provided by verbal instruction and written materials to support subject.  Instructor reviews cardiac drug classes: antiplatelets, anticoagulants, beta blockers, and statins.  Instructor discusses reasons, side effects, and lifestyle considerations for each drug class.   Cardiac Drugs II:  -Group instruction provided by verbal instruction and written materials to support subject.  Instructor reviews cardiac drug classes: angiotensin converting enzyme inhibitors (ACE-I), angiotensin II receptor blockers (ARBs), nitrates, and calcium channel blockers.  Instructor discusses reasons, side effects, and lifestyle considerations for each drug class.   Anatomy and Physiology of the Circulatory System:  -Group instruction provided by verbal instruction, video, and written materials to support subject.  Reviews functional anatomy of heart,  how it relates to various diagnoses, and what role the heart plays in the overall system.   Knowledge Questionnaire Score:     Knowledge Questionnaire Score - 08/30/16 1251      Knowledge Questionnaire Score   Pre Score 20/24      Core Components/Risk Factors/Patient Goals at Admission:     Personal Goals and Risk Factors at Admission - 08/30/16 0912      Core Components/Risk Factors/Patient Goals on Admission   Increase Strength and Stamina Yes   Intervention Provide advice, education, support and counseling about physical activity/exercise needs.;Develop an individualized exercise prescription for aerobic and resistive training based on initial evaluation findings, risk stratification, comorbidities and participant's personal goals.   Expected Outcomes Achievement of increased  cardiorespiratory fitness and enhanced flexibility, muscular endurance and strength shown through measurements of functional capacity and personal statement of participant.   Hypertension Yes   Intervention Provide education on lifestyle modifcations including regular physical activity/exercise, weight management, moderate sodium restriction and increased consumption of fresh fruit, vegetables, and low fat dairy, alcohol moderation, and smoking cessation.;Monitor prescription use compliance.   Expected Outcomes Short Term: Continued assessment and intervention until BP is < 140/25mm HG in hypertensive participants. < 130/19mm HG in hypertensive participants with diabetes, heart failure or chronic kidney disease.;Long Term: Maintenance of blood pressure at goal levels.   Lipids Yes   Intervention Provide education and support for participant on nutrition & aerobic/resistive exercise along with prescribed medications to achieve LDL 70mg , HDL >40mg .   Expected Outcomes Short Term: Participant states understanding of desired cholesterol values and is compliant with medications prescribed. Participant is following exercise prescription and nutrition guidelines.;Long Term: Cholesterol controlled with medications as prescribed, with individualized exercise RX and with personalized nutrition plan. Value goals: LDL < 70mg , HDL > 40 mg.   Stress Yes   Intervention Offer individual and/or small group education and counseling on adjustment to heart disease, stress management and health-related lifestyle change. Teach and support self-help strategies.;Refer participants experiencing significant psychosocial distress to appropriate mental health specialists for further evaluation and treatment. When possible, include family members and significant others in education/counseling sessions.   Expected Outcomes Short Term: Participant demonstrates changes in health-related behavior, relaxation and other stress management  skills, ability to obtain effective social support, and compliance with psychotropic medications if prescribed.;Long Term: Emotional wellbeing is indicated by absence of clinically significant psychosocial distress or social isolation.   Personal Goal Other Yes   Personal Goal get driver's license, improve stamina and increase strength   Intervention Provide education classes (nutrition, functional fitness, medication management, ect) and an individualized exercise program   Expected Outcomes Imrove overall functional ability       Core Components/Risk Factors/Patient Goals Review:    Core Components/Risk Factors/Patient Goals at Discharge (Final Review):    ITP Comments:     ITP Comments    Row Name 08/30/16 0912           ITP Comments Fransico Him MD          Comments:  Pt in today for cardiac rehab orientation from 0800 to 1030.  Pt placed on telemetry monitor upon arrival.  As a part of the orientation appointment, pt completed 5 minutes of warm up stretches with the exercise physiologist and completed 6 minute walk test.  Pt tolerated well with no complaints. Monitor showed atrial paced with pt own intrinsic beats at time. Pt used rolater for assistance in ambulation. Daughter accompanied pt today.Pt is looking forward  to participating in cardiac rehab on next week. Maurice Small RN

## 2016-09-03 ENCOUNTER — Encounter (HOSPITAL_COMMUNITY): Payer: Medicare Other

## 2016-09-05 ENCOUNTER — Encounter (HOSPITAL_COMMUNITY): Payer: Medicare Other

## 2016-09-07 ENCOUNTER — Ambulatory Visit (INDEPENDENT_AMBULATORY_CARE_PROVIDER_SITE_OTHER): Payer: Medicare Other | Admitting: Cardiology

## 2016-09-07 ENCOUNTER — Encounter (HOSPITAL_COMMUNITY)
Admission: RE | Admit: 2016-09-07 | Discharge: 2016-09-07 | Disposition: A | Discharge status: Home / Self Care | Payer: Medicare Other | Source: Ambulatory Visit | Attending: Cardiology | Admitting: Cardiology

## 2016-09-07 ENCOUNTER — Encounter: Payer: Self-pay | Admitting: Cardiology

## 2016-09-07 VITALS — BP 112/68 | HR 60 | Ht 71.0 in | Wt 163.2 lb

## 2016-09-07 DIAGNOSIS — I272 Pulmonary hypertension, unspecified: Secondary | ICD-10-CM

## 2016-09-07 DIAGNOSIS — I420 Dilated cardiomyopathy: Secondary | ICD-10-CM

## 2016-09-07 DIAGNOSIS — I255 Ischemic cardiomyopathy: Secondary | ICD-10-CM

## 2016-09-07 DIAGNOSIS — I2589 Other forms of chronic ischemic heart disease: Secondary | ICD-10-CM

## 2016-09-07 DIAGNOSIS — I214 Non-ST elevation (NSTEMI) myocardial infarction: Secondary | ICD-10-CM | POA: Diagnosis not present

## 2016-09-07 DIAGNOSIS — Z95810 Presence of automatic (implantable) cardiac defibrillator: Secondary | ICD-10-CM | POA: Diagnosis not present

## 2016-09-07 DIAGNOSIS — Z5189 Encounter for other specified aftercare: Secondary | ICD-10-CM | POA: Diagnosis not present

## 2016-09-07 DIAGNOSIS — I10 Essential (primary) hypertension: Secondary | ICD-10-CM

## 2016-09-07 NOTE — Assessment & Plan Note (Signed)
B/P was low after his MI, Lisinopril stopped Oct 2017- B/P better today

## 2016-09-07 NOTE — Assessment & Plan Note (Deleted)
EF 35-40% by echo June 2017

## 2016-09-07 NOTE — Patient Instructions (Signed)
Your physician recommends that you schedule a follow-up appointment in: 3 Months with Dr Percival Spanish         Happy Thanksgiving
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
## 2016-09-07 NOTE — Assessment & Plan Note (Signed)
Gen change June 2017 

## 2016-09-07 NOTE — Progress Notes (Signed)
Daily Session Note  Patient Details  Name: [name] MRN: [id number] Date of Birth: [birth year] Referring Provider:   Flowsheet Row CARDIAC REHAB PHASE II ORIENTATION from 08/30/2016 in [place]  Referring Provider  Marijo File MD      Encounter Date: 09/07/2016  Check In:   Capillary Blood Glucose: No results found for this or any previous visit (from the past 24 hour(s)).   Goals Met:  Exercise tolerated well  Goals Unmet:  Not Applicable  Comments: Pt started cardiac rehab today.  Pt tolerated light exercise without difficulty. VSS, telemetry-atrial paced with underlying sinus rhythm, asymptomatic.  Medication list reconciled. Pt denies barriers to medicaiton compliance.  PSYCHOSOCIAL ASSESSMENT:  PHQ-0. Pt exhibits positive coping skills, hopeful outlook with supportive family. No psychosocial needs identified at this time, no psychosocial interventions necessary.    Pt enjoys swimming and playing pool.   Pt oriented to exercise equipment and routine.    Understanding verbalized.Barnet Pall, RN,BSN 09/07/2016 3:46 PM   Dr. Fransico Him is Medical Director for Cardiac Rehab at [facility].

## 2016-09-07 NOTE — Assessment & Plan Note (Signed)
02/2002 H/o MI with stenting x 2 to LAD 03/2016 VT Arrest-Cath: LM 60ost, LAD 40p/m ISR, LCX 100p with L-L collaterals, RCA nl-->Med Rx. 

## 2016-09-07 NOTE — Assessment & Plan Note (Signed)
EF 35-40% by echo June 2017 No CHF on exam

## 2016-09-10 ENCOUNTER — Encounter (HOSPITAL_COMMUNITY)
Admission: RE | Admit: 2016-09-10 | Discharge: 2016-09-10 | Disposition: A | Discharge status: Home / Self Care | Payer: Medicare Other | Source: Ambulatory Visit | Attending: Cardiology | Admitting: Cardiology

## 2016-09-10 ENCOUNTER — Ambulatory Visit: Payer: Medicare Other | Admitting: Pulmonary Disease

## 2016-09-10 DIAGNOSIS — I214 Non-ST elevation (NSTEMI) myocardial infarction: Secondary | ICD-10-CM | POA: Diagnosis not present

## 2016-09-10 DIAGNOSIS — Z5189 Encounter for other specified aftercare: Secondary | ICD-10-CM | POA: Diagnosis not present

## 2016-09-11 ENCOUNTER — Ambulatory Visit: Payer: Medicare Other | Admitting: Cardiology

## 2016-09-12 ENCOUNTER — Encounter (HOSPITAL_COMMUNITY)
Admission: RE | Admit: 2016-09-12 | Discharge: 2016-09-12 | Disposition: A | Discharge status: Home / Self Care | Payer: Medicare Other | Source: Ambulatory Visit | Attending: Cardiology | Admitting: Cardiology

## 2016-09-12 DIAGNOSIS — I214 Non-ST elevation (NSTEMI) myocardial infarction: Secondary | ICD-10-CM | POA: Diagnosis not present

## 2016-09-12 DIAGNOSIS — Z5189 Encounter for other specified aftercare: Secondary | ICD-10-CM | POA: Diagnosis not present

## 2016-09-15 ENCOUNTER — Other Ambulatory Visit: Payer: Self-pay | Admitting: Pulmonary Disease

## 2016-09-17 ENCOUNTER — Encounter (HOSPITAL_COMMUNITY)
Admission: RE | Admit: 2016-09-17 | Discharge: 2016-09-17 | Disposition: A | Discharge status: Home / Self Care | Payer: Medicare Other | Source: Ambulatory Visit | Attending: Cardiology | Admitting: Cardiology

## 2016-09-17 DIAGNOSIS — Z5189 Encounter for other specified aftercare: Secondary | ICD-10-CM | POA: Diagnosis not present

## 2016-09-17 DIAGNOSIS — I214 Non-ST elevation (NSTEMI) myocardial infarction: Secondary | ICD-10-CM | POA: Diagnosis not present

## 2016-09-18 NOTE — Progress Notes (Signed)
1/2 cm skin tear noted on Mr Cwikla's left hand yesterday towards the end of exercise. Mr Grewell is not sure how it happened. The area was cleaned with soap and water a small band aid was applied. Will continue to monitor the patient throughout  the program.Hoyte Ziebell Venetia Maxon, RN,BSN 09/18/2016 11:33 AM

## 2016-09-19 ENCOUNTER — Encounter (HOSPITAL_COMMUNITY)
Admission: RE | Admit: 2016-09-19 | Discharge: 2016-09-19 | Disposition: A | Discharge status: Home / Self Care | Payer: Medicare Other | Source: Ambulatory Visit | Attending: Cardiology | Admitting: Cardiology

## 2016-09-19 DIAGNOSIS — Z5189 Encounter for other specified aftercare: Secondary | ICD-10-CM | POA: Diagnosis not present

## 2016-09-19 DIAGNOSIS — I214 Non-ST elevation (NSTEMI) myocardial infarction: Secondary | ICD-10-CM | POA: Diagnosis not present

## 2016-09-21 ENCOUNTER — Other Ambulatory Visit: Payer: Self-pay

## 2016-09-21 ENCOUNTER — Encounter (HOSPITAL_COMMUNITY)
Admission: RE | Admit: 2016-09-21 | Discharge: 2016-09-21 | Disposition: A | Discharge status: Home / Self Care | Payer: Medicare Other | Source: Ambulatory Visit | Attending: Cardiology | Admitting: Cardiology

## 2016-09-21 DIAGNOSIS — Z5189 Encounter for other specified aftercare: Secondary | ICD-10-CM | POA: Diagnosis not present

## 2016-09-21 DIAGNOSIS — I779 Disorder of arteries and arterioles, unspecified: Secondary | ICD-10-CM

## 2016-09-21 DIAGNOSIS — I214 Non-ST elevation (NSTEMI) myocardial infarction: Secondary | ICD-10-CM | POA: Diagnosis not present

## 2016-09-21 DIAGNOSIS — I739 Peripheral vascular disease, unspecified: Principal | ICD-10-CM

## 2016-09-22 ENCOUNTER — Other Ambulatory Visit: Payer: Self-pay | Admitting: Pulmonary Disease

## 2016-09-24 ENCOUNTER — Encounter (HOSPITAL_COMMUNITY)
Admission: RE | Admit: 2016-09-24 | Discharge: 2016-09-24 | Disposition: A | Discharge status: Home / Self Care | Payer: Medicare Other | Source: Ambulatory Visit | Attending: Cardiology | Admitting: Cardiology

## 2016-09-24 DIAGNOSIS — I214 Non-ST elevation (NSTEMI) myocardial infarction: Secondary | ICD-10-CM

## 2016-09-24 DIAGNOSIS — Z5189 Encounter for other specified aftercare: Secondary | ICD-10-CM | POA: Diagnosis not present

## 2016-09-25 NOTE — Progress Notes (Signed)
Cardiac Individual Treatment Plan  Patient Details  Name: [name] MRN: [id number] Date of Birth: Mar 27, 1931 Referring Provider:   Flowsheet Row CARDIAC REHAB PHASE II ORIENTATION from 08/30/2016 in [place]  Referring Provider  Marijo File MD      Initial Encounter Date:  [place] PHASE II ORIENTATION from 08/30/2016 in [place]  Date  08/30/16  Referring Provider  Marijo File MD      Visit Diagnosis: 03/2016 NSTEMI (non-ST elevated myocardial infarction) [facility])  Patient's Home Medications on Admission:  Current Outpatient Prescriptions:  .  allopurinol (ZYLOPRIM) 100 MG tablet, TAKE 1 TABLET ONCE DAILY., Disp: 30 tablet, Rfl: 5 .  amiodarone (PACERONE) 200 MG tablet, Take 1 tablet (200 mg total) by mouth daily., Disp: 30 tablet, Rfl: 5 .  carvedilol (COREG) 3.125 MG tablet, Take 1 tablet (3.125 mg total) by mouth 2 (two) times daily with a meal., Disp: 60 tablet, Rfl: 11 .  cholecalciferol (VITAMIN D) 1000 units tablet, Take 1,000 Units by mouth daily., Disp: , Rfl:  .  clopidogrel (PLAVIX) 75 MG tablet, Take 1 tablet by mouth daily., Disp: , Rfl: 4 .  Fluticasone-Salmeterol (ADVAIR DISKUS) 250-50 MCG/DOSE AEPB, Inhale 1 puff into the lungs 2 (two) times daily., Disp: 60 each, Rfl: 5 .  guaiFENesin (MUCINEX) 600 MG 12 hr tablet, Take 600 mg by mouth 2 (two) times daily., Disp: , Rfl:  .  levothyroxine (SYNTHROID, LEVOTHROID) 25 MCG tablet, Take 1 tablet (25 mcg total) by mouth daily before breakfast., Disp: 30 tablet, Rfl: 5 .  magnesium oxide (MAG-OX) 400 (241.3 Mg) MG tablet, Take 0.5 tablets (200 mg total) by mouth daily., Disp: 30 tablet, Rfl: 0 .  Multiple Vitamin (MULTIVITAMIN) capsule, Take 1 capsule by mouth daily., Disp: , Rfl:  .  Multiple Vitamins-Minerals (PRESERVISION/LUTEIN) CAPS, Take 1 capsule by mouth daily. , Disp: , Rfl:  .  OXYBUTYNIN CHLORIDE PO, Take 1 tablet by mouth  2 (two) times daily. , Disp: , Rfl:  .  polyethylene glycol powder (GLYCOLAX/MIRALAX) powder, DISSOLVE ONE CAPFUL IN 8 0Z. WATER TWICE DAILY., Disp: 527 g, Rfl: 0 .  pravastatin (PRAVACHOL) 40 MG tablet, Take 1 tablet (40 mg total) by mouth daily., Disp: 90 tablet, Rfl: 3 .  senna-docusate (SENOKOT-S) 8.6-50 MG tablet, Take 1 tablet by mouth 2 (two) times daily., Disp: 60 tablet, Rfl: 0 .  sertraline (ZOLOFT) 50 MG tablet, Take 1 tablet (50 mg total) by mouth at bedtime., Disp: 30 tablet, Rfl: 5 .  thiamine 100 MG tablet, Take 1 tablet (100 mg total) by mouth daily., Disp: 30 tablet, Rfl: 0  Past Medical History: Past Medical History:  Diagnosis Date  . AAA (abdominal aortic aneurysm) ([place])    a. 2002 s/p repair.  . Abnormal liver function tests   . AICD (automatic cardioverter/defibrillator) present   . Allergic rhinitis   . Atherosclerotic heart disease   . Back pain   . CAD (coronary artery disease)    a. 02/2002 H/o MI with stenting x 2; b. 04/2013 MV: EF 25%, large posterior lateral infarct w/o ischemia; c. 03/2016 VT Arrest/Cath: LM 60ost, LAD 40p/m ISR, LCX 100p/m, RCA nl-->Med Rx.  . Carotid arterial disease ([place])    a. 12/2010 s/p R CEA;  b. 05/2015 Carotid U/S: bilat <40% ICA stenosis.  . Chronic combined systolic and diastolic CHF (congestive heart failure) ([place])    a. 03/2016 Echo: Ef 35-40%, grade 1 DD.  Marland Kitchen  Compression fracture   . COPD (chronic obstructive pulmonary disease) (H[facility])   . Coronary artery disease   . Dermatitis   . Diverticulosis of colon   . DJD (degenerative joint disease)    and Gout  . Hemorrhoids   . History of colonic polyps   . History of gout   . History of pneumonia   . Hypercholesterolemia   . Hypertension   . Hypertensive heart disease   . Ischemic cardiomyopathy    a. EF prev <35%-->improved to normal by Echo 8/14:  Mild LVH, focal basal hypertrophy, EF 60-65%, normal wall motion, mild BAE, PASP 36; c. 03/2016 Echo: EF 35-40%, Gr1 DD, triv AI, mild  MR, mod dil LA, mild-mod TR, PASP 43mmHg.  . Osteoporosis   . Peripheral vascular disease (HCC)   . Presence of cardiac defibrillator    a. 03/2008 s/p MDT [id number] Maximo II DR, DC AICD; b. 03/2013: ICD shock for T wave oversensing;  c. 10/2015: collective decision not to replace ICD given improvement in LV fxn; d. VT Arrest-->Gen change to MDT ser # [id number].  . Skin cancer    shoulders and forehead  . Syncope   . T wave over sensing resulting in inappropriate shocks    a. 03/2013.  . Tobacco abuse   . Ventricular tachycardia ([facility]) 04/01/2016    Tobacco Use: History  Smoking Status  . Former Smoker  . Types: Cigarettes, Cigars  . Quit date: 10/22/2012  Smokeless Tobacco  . Never Used    Comment: quit about 10 years ago    Labs: Recent Review Flowsheet Data    Labs for ITP Cardiac and Pulmonary Rehab Latest Ref Rng & Units 05/11/2015 03/29/2016 03/29/2016 03/30/2016 04/02/2016   Cholestrol 0 - 200 mg/dL 127 - - - -   LDLCALC 0 - 99 mg/dL 57 - - - -   LDLDIRECT mg/dL - - - - -   HDL >39.00 mg/dL 41.70 - - - -   Trlycerides 0.0 - 149.0 mg/dL 140.0 - - - -   Hemoglobin A1c 4.8 - 5.6 % - - - - 5.1   PHART 7.350 - 7.450 - - 7.283(L) 7.466(H) -   PCO2ART 35.0 - 45.0 mmHg - - 29.2(L) 25.6(L) -   HCO3 20.0 - 24.0 mEq/L - - 13.8(L) 18.2(L) -   TCO2 0 - 100 mmol/L - 16 15 19.0 -   ACIDBASEDEF 0.0 - 2.0 mmol/L - - 12.0(H) 4.9(H) -   O2SAT % - - 100.0 99.0 -      Capillary Blood Glucose: Lab Results  Component Value Date   GLUCAP 140 (H) 04/07/2016   GLUCAP 121 (H) 04/01/2016   GLUCAP 100 (H) 04/01/2016   GLUCAP 119 (H) 03/31/2016   GLUCAP 113 (H) 03/31/2016     Exercise Target Goals:    Exercise Program Goal: Individual exercise prescription set with THRR, safety & activity barriers. Participant demonstrates ability to understand and report RPE using BORG scale, to self-measure pulse accurately, and to acknowledge the importance of the exercise prescription.  Exercise Prescription  Goal: Starting with aerobic activity 30 plus minutes a day, 3 days per week for initial exercise prescription. Provide home exercise prescription and guidelines that participant acknowledges understanding prior to discharge.  Activity Barriers & Risk Stratification:     Activity Barriers & Cardiac Risk Stratification - 08/30/16 0911      Activity Barriers & Cardiac Risk Stratification   Activity Barriers Back Problems   Cardiac Risk Stratification High        6 Minute Walk:     6 Minute Walk    Row Name 08/30/16 1251         6 Minute Walk   Phase Initial     Distance 1021 feet     Walk Time 6 minutes     # of Rest Breaks 0     MPH 1.9     METS 1.6     RPE 11     VO2 Peak 5.7     Symptoms No     Resting HR 60 bpm     Resting BP 100/60     Max Ex. HR 60 bpm     Max Ex. BP 132/74     2 Minute Post BP 132/70        Initial Exercise Prescription:     Initial Exercise Prescription - 08/30/16 1200      Date of Initial Exercise RX and Referring Provider   Date 08/30/16   Referring Provider Marijo File MD     Recumbant Bike   Level --   Minutes --   METs --     NuStep   Level 1   Minutes 10   METs 1.5     Arm Ergometer   Level 1   Minutes 10   METs 2.7     Track   Laps 8   Minutes 10   METs 2.39     Prescription Details   Frequency (times per week) 3   Duration Progress to 45 minutes of aerobic exercise without signs/symptoms of physical distress     Intensity   THRR 40-80% of Max Heartrate 55-110   Ratings of Perceived Exertion 11-13   Perceived Dyspnea 0-4     Progression   Progression Continue to progress workloads to maintain intensity without signs/symptoms of physical distress.     Resistance Training   Training Prescription Yes   Weight 2   Reps 10-12      Perform Capillary Blood Glucose checks as needed.  Exercise Prescription Changes:      Exercise Prescription Changes    Row Name 09/26/16 1600             Response  to Exercise   Blood Pressure (Admit) 98/64       Blood Pressure (Exercise) 104/60       Blood Pressure (Exit) 112/60       Heart Rate (Admit) 60 bpm       Heart Rate (Exercise) 91 bpm       Heart Rate (Exit) 60 bpm       Rating of Perceived Exertion (Exercise) 12       Duration Progress to 45 minutes of aerobic exercise without signs/symptoms of physical distress       Intensity THRR unchanged         Progression   Progression Continue to progress workloads to maintain intensity without signs/symptoms of physical distress.       Average METs 2.4         Resistance Training   Training Prescription Yes       Weight 2       Reps 10-12         NuStep   Level 1       Minutes 10       METs 2.1         Arm Ergometer   Level 1       Minutes 10  METs 2.76         Track   Laps 11       Minutes 10       METs 2.92          Exercise Comments:      Exercise Comments    Row Name 09/26/16 1643           Exercise Comments Reviewed METs and goals with pt.  Pt is off to a great start and is tolerating exercise well.            Discharge Exercise Prescription (Final Exercise Prescription Changes):     Exercise Prescription Changes - 09/26/16 1600      Response to Exercise   Blood Pressure (Admit) 98/64   Blood Pressure (Exercise) 104/60   Blood Pressure (Exit) 112/60   Heart Rate (Admit) 60 bpm   Heart Rate (Exercise) 91 bpm   Heart Rate (Exit) 60 bpm   Rating of Perceived Exertion (Exercise) 12   Duration Progress to 45 minutes of aerobic exercise without signs/symptoms of physical distress   Intensity THRR unchanged     Progression   Progression Continue to progress workloads to maintain intensity without signs/symptoms of physical distress.   Average METs 2.4     Resistance Training   Training Prescription Yes   Weight 2   Reps 10-12     NuStep   Level 1   Minutes 10   METs 2.1     Arm Ergometer   Level 1   Minutes 10   METs 2.76     Track    Laps 11   Minutes 10   METs 2.92      Nutrition:  Target Goals: Understanding of nutrition guidelines, daily intake of sodium 1500mg , cholesterol 200mg , calories 30% from fat and 7% or less from saturated fats, daily to have 5 or more servings of fruits and vegetables.  Biometrics:     Pre Biometrics - 08/30/16 1553      Pre Biometrics   Waist Circumference 42.25 inches   Hip Circumference 39.5 inches   Waist to Hip Ratio 1.07 %   Triceps Skinfold 10 mm   % Body Fat 25.8 %   Grip Strength 30 kg   Flexibility 0 in   Single Leg Stand 0.5 seconds       Nutrition Therapy Plan and Nutrition Goals:     Nutrition Therapy & Goals - 09/17/16 0918      Nutrition Therapy   Diet Therapeutic Lifestyle Changes     Personal Nutrition Goals   Personal Goal #1 Maintain wt of 162 lb (73.8 kg) while in Cardiac Rehab     Intervention Plan   Intervention Prescribe, educate and counsel regarding individualized specific dietary modifications aiming towards targeted core components such as weight, hypertension, lipid management, diabetes, heart failure and other comorbidities.   Expected Outcomes Short Term Goal: Understand basic principles of dietary content, such as calories, fat, sodium, cholesterol and nutrients.;Long Term Goal: Adherence to prescribed nutrition plan.      Nutrition Discharge: Nutrition Scores:     Nutrition Assessments - 09/17/16 1049      MEDFICTS Scores   Pre Score 68      Nutrition Goals Re-Evaluation:   Psychosocial: Target Goals: Acknowledge presence or absence of depression, maximize coping skills, provide positive support system. Participant is able to verbalize types and ability to use techniques and skills needed for reducing stress and depression.  Initial Review & Psychosocial  Screening:     Initial Psych Review & Screening - 08/30/16 [place]? Yes   Comments Daughter present for orientation.  Brief  assessment reveals no identifiable needs, no further intervention warranted     Barriers   Psychosocial barriers to participate in program There are no identifiable barriers or psychosocial needs.      Quality of Life Scores:     Quality of Life - 08/30/16 1425      Quality of Life Scores   Health/Function Pre 17.79 %   Socioeconomic Pre 22.64 %   Psych/Spiritual Pre 18.43 %   Family Pre 24.75 %   GLOBAL Pre 19.86 %      PHQ-9: Recent Review Flowsheet Data    Depression screen [facility] 2/9 07/09/2016 05/10/2016 06/08/2013   Decreased Interest 1 3 0   Down, Depressed, Hopeless 0 2 0   PHQ - 2 Score 1 5 0   Altered sleeping - 0 -   Tired, decreased energy - 3 -   Change in appetite - 3 -   Feeling bad or failure about yourself  - 3 -   Trouble concentrating - 0 -   Moving slowly or fidgety/restless - 0 -   Suicidal thoughts - 0 -   PHQ-9 Score - 14 -   Difficult doing work/chores - Somewhat difficult -      Psychosocial Evaluation and Intervention:     Psychosocial Evaluation - 08/30/16 1706      Discharge Psychosocial Assessment & Intervention   Discharge Continue support measures as needed      Psychosocial Re-Evaluation:     Psychosocial Re-Evaluation    [place] Name 09/21/16 1610             Psychosocial Re-Evaluation   Interventions Encouraged to attend Cardiac Rehabilitation for the exercise       Continued Psychosocial Services Needed No          Vocational Rehabilitation: Provide vocational rehab assistance to qualifying candidates.   Vocational Rehab Evaluation & Intervention:     Vocational Rehab - 08/30/16 1706      Initial Vocational Rehab Evaluation & Intervention   Assessment shows need for Vocational Rehabilitation No  Pt does not plan to return to competitive employment.  Pt is retired.      Education: Education Goals: Education classes will be provided on a weekly basis, covering required topics. Participant will state  understanding/return demonstration of topics presented.  Learning Barriers/Preferences:     Learning Barriers/Preferences - 08/30/16 0910      Learning Barriers/Preferences   Learning Barriers Sight;Hearing   Learning Preferences Written Material;Verbal Instruction;Skilled Demonstration      Education Topics: Count Your Pulse:  -Group instruction provided by verbal instruction, demonstration, patient participation and written materials to support subject.  Instructors address importance of being able to find your pulse and how to count your pulse when at home without a heart monitor.  Patients get hands on experience counting their pulse with staff help and individually.   Heart Attack, Angina, and Risk Factor Modification:  -Group instruction provided by verbal instruction, video, and written materials to support subject.  Instructors address signs and symptoms of angina and heart attacks.    Also discuss risk factors for heart disease and how to make changes to improve heart health risk factors.   Functional Fitness:  -Group instruction provided by verbal instruction, demonstration, patient participation, and written materials to  support subject.  Instructors address safety measures for doing things around the house.  Discuss how to get up and down off the floor, how to pick things up properly, how to safely get out of a chair without assistance, and balance training.   Meditation and Mindfulness:  -Group instruction provided by verbal instruction, patient participation, and written materials to support subject.  Instructor addresses importance of mindfulness and meditation practice to help reduce stress and improve awareness.  Instructor also leads participants through a meditation exercise.    Stretching for Flexibility and Mobility:  -Group instruction provided by verbal instruction, patient participation, and written materials to support subject.  Instructors lead participants  through series of stretches that are designed to increase flexibility thus improving mobility.  These stretches are additional exercise for major muscle groups that are typically performed during regular warm up and cool down.   Hands Only CPR Anytime:  -Group instruction provided by verbal instruction, video, patient participation and written materials to support subject.  Instructors co-teach with AHA video for hands only CPR.  Participants get hands on experience with mannequins.   Nutrition I class: Heart Healthy Eating:  -Group instruction provided by PowerPoint slides, verbal discussion, and written materials to support subject matter. The instructor gives an explanation and review of the Therapeutic Lifestyle Changes diet recommendations, which includes a discussion on lipid goals, dietary fat, sodium, fiber, plant stanol/sterol esters, sugar, and the components of a well-balanced, healthy diet.   Nutrition II class: Lifestyle Skills:  -Group instruction provided by PowerPoint slides, verbal discussion, and written materials to support subject matter. The instructor gives an explanation and review of label reading, grocery shopping for heart health, heart healthy recipe modifications, and ways to make healthier choices when eating out.   Diabetes Question & Answer:  -Group instruction provided by PowerPoint slides, verbal discussion, and written materials to support subject matter. The instructor gives an explanation and review of diabetes co-morbidities, pre- and post-prandial blood glucose goals, pre-exercise blood glucose goals, signs, symptoms, and treatment of hypoglycemia and hyperglycemia, and foot care basics.   Diabetes Blitz:  -Group instruction provided by PowerPoint slides, verbal discussion, and written materials to support subject matter. The instructor gives an explanation and review of the physiology behind type 1 and type 2 diabetes, diabetes medications and rational  behind using different medications, pre- and post-prandial blood glucose recommendations and Hemoglobin A1c goals, diabetes diet, and exercise including blood glucose guidelines for exercising safely.    Portion Distortion:  -Group instruction provided by PowerPoint slides, verbal discussion, written materials, and food models to support subject matter. The instructor gives an explanation of serving size versus portion size, changes in portions sizes over the last 20 years, and what consists of a serving from each food group. Flowsheet Row CARDIAC REHAB PHASE II EXERCISE from 09/19/2016 in [place]  Date  09/19/16 [Holiday Eating Survival Tips]  Educator  RD  Instruction Review Code  2- meets goals/outcomes      Stress Management:  -Group instruction provided by verbal instruction, video, and written materials to support subject matter.  Instructors review role of stress in heart disease and how to cope with stress positively.     Exercising on Your Own:  -Group instruction provided by verbal instruction, power point, and written materials to support subject.  Instructors discuss benefits of exercise, components of exercise, frequency and intensity of exercise, and end points for exercise.  Also discuss use of nitroglycerin and activating  EMS.  Review options of places to exercise outside of rehab.  Review guidelines for sex with heart disease.   Cardiac Drugs I:  -Group instruction provided by verbal instruction and written materials to support subject.  Instructor reviews cardiac drug classes: antiplatelets, anticoagulants, beta blockers, and statins.  Instructor discusses reasons, side effects, and lifestyle considerations for each drug class.   Cardiac Drugs II:  -Group instruction provided by verbal instruction and written materials to support subject.  Instructor reviews cardiac drug classes: angiotensin converting enzyme inhibitors (ACE-I),  angiotensin II receptor blockers (ARBs), nitrates, and calcium channel blockers.  Instructor discusses reasons, side effects, and lifestyle considerations for each drug class.   Anatomy and Physiology of the Circulatory System:  -Group instruction provided by verbal instruction, video, and written materials to support subject.  Reviews functional anatomy of heart, how it relates to various diagnoses, and what role the heart plays in the overall system.   Knowledge Questionnaire Score:     Knowledge Questionnaire Score - 08/30/16 1251      Knowledge Questionnaire Score   Pre Score 20/24      Core Components/Risk Factors/Patient Goals at Admission:     Personal Goals and Risk Factors at Admission - 08/30/16 0912      Core Components/Risk Factors/Patient Goals on Admission   Increase Strength and Stamina Yes   Intervention Provide advice, education, support and counseling about physical activity/exercise needs.;Develop an individualized exercise prescription for aerobic and resistive training based on initial evaluation findings, risk stratification, comorbidities and participant's personal goals.   Expected Outcomes Achievement of increased cardiorespiratory fitness and enhanced flexibility, muscular endurance and strength shown through measurements of functional capacity and personal statement of participant.   Hypertension Yes   Intervention Provide education on lifestyle modifcations including regular physical activity/exercise, weight management, moderate sodium restriction and increased consumption of fresh fruit, vegetables, and low fat dairy, alcohol moderation, and smoking cessation.;Monitor prescription use compliance.   Expected Outcomes Short Term: Continued assessment and intervention until BP is < 140/39mm HG in hypertensive participants. < 130/34mm HG in hypertensive participants with diabetes, heart failure or chronic kidney disease.;Long Term: Maintenance of blood pressure  at goal levels.   Lipids Yes   Intervention Provide education and support for participant on nutrition & aerobic/resistive exercise along with prescribed medications to achieve LDL 70mg , HDL >40mg .   Expected Outcomes Short Term: Participant states understanding of desired cholesterol values and is compliant with medications prescribed. Participant is following exercise prescription and nutrition guidelines.;Long Term: Cholesterol controlled with medications as prescribed, with individualized exercise RX and with personalized nutrition plan. Value goals: LDL < 70mg , HDL > 40 mg.   Stress Yes   Intervention Offer individual and/or small group education and counseling on adjustment to heart disease, stress management and health-related lifestyle change. Teach and support self-help strategies.;Refer participants experiencing significant psychosocial distress to appropriate mental health specialists for further evaluation and treatment. When possible, include family members and significant others in education/counseling sessions.   Expected Outcomes Short Term: Participant demonstrates changes in health-related behavior, relaxation and other stress management skills, ability to obtain effective social support, and compliance with psychotropic medications if prescribed.;Long Term: Emotional wellbeing is indicated by absence of clinically significant psychosocial distress or social isolation.   Personal Goal Other Yes   Personal Goal get driver's license, improve stamina and increase strength   Intervention Provide education classes (nutrition, functional fitness, medication management, ect) and an individualized exercise program   Expected Outcomes Imrove overall functional ability  Core Components/Risk Factors/Patient Goals Review:      Goals and Risk Factor Review    Row Name 09/26/16 1644             Core Components/Risk Factors/Patient Goals Review   Personal Goals Review Increase  Strength and Stamina;Other       Review Pt states he feels about the same prior to CRPII but he is tolerating exercise well and says he feels good overall.         Expected Outcomes Continue with exercise routine and home exercise program and increase workload as tolerated in order to improve strength and stamina          Core Components/Risk Factors/Patient Goals at Discharge (Final Review):      Goals and Risk Factor Review - 09/26/16 1644      Core Components/Risk Factors/Patient Goals Review   Personal Goals Review Increase Strength and Stamina;Other   Review Pt states he feels about the same prior to CRPII but he is tolerating exercise well and says he feels good overall.     Expected Outcomes Continue with exercise routine and home exercise program and increase workload as tolerated in order to improve strength and stamina      ITP Comments:     ITP Comments    Row Name 08/30/16 0912           ITP Comments Fransico Him MD          Comments: Laury is making expected progress toward personal goals after completing 8 sessions and reports feeling stronger since he has been participating in phase 2 cardiac rehab. Recommend continued exercise and life style modification education including  stress management and relaxation techniques to decrease cardiac risk profile.Damareon feels stronger since he has been participating in phase 2 cardiac rehab and hopes to be able to drive in the coming months. Asahd has been getting a ride to cardiac rehab.Barnet Pall, RN,BSN 09/27/2016 12:17 PM

## 2016-09-26 ENCOUNTER — Encounter (HOSPITAL_COMMUNITY)
Admission: RE | Admit: 2016-09-26 | Discharge: 2016-09-26 | Disposition: A | Discharge status: Home / Self Care | Payer: Medicare Other | Source: Ambulatory Visit | Attending: Cardiology | Admitting: Cardiology

## 2016-09-26 DIAGNOSIS — I214 Non-ST elevation (NSTEMI) myocardial infarction: Secondary | ICD-10-CM

## 2016-09-26 DIAGNOSIS — Z5189 Encounter for other specified aftercare: Secondary | ICD-10-CM | POA: Diagnosis not present

## 2016-09-28 ENCOUNTER — Encounter (HOSPITAL_COMMUNITY)
Admission: RE | Admit: 2016-09-28 | Discharge: 2016-09-28 | Disposition: A | Discharge status: Home / Self Care | Payer: Medicare Other | Source: Ambulatory Visit | Attending: Cardiology | Admitting: Cardiology

## 2016-10-01 ENCOUNTER — Encounter (HOSPITAL_COMMUNITY)
Admission: RE | Admit: 2016-10-01 | Discharge: 2016-10-01 | Disposition: A | Discharge status: Home / Self Care | Payer: Medicare Other | Source: Ambulatory Visit | Attending: Cardiology | Admitting: Cardiology

## 2016-10-01 DIAGNOSIS — I214 Non-ST elevation (NSTEMI) myocardial infarction: Secondary | ICD-10-CM | POA: Diagnosis not present

## 2016-10-01 DIAGNOSIS — Z5189 Encounter for other specified aftercare: Secondary | ICD-10-CM | POA: Diagnosis not present

## 2016-10-03 ENCOUNTER — Encounter (HOSPITAL_COMMUNITY)
Admission: RE | Admit: 2016-10-03 | Discharge: 2016-10-03 | Disposition: A | Discharge status: Home / Self Care | Payer: Medicare Other | Source: Ambulatory Visit | Attending: Cardiology | Admitting: Cardiology

## 2016-10-03 DIAGNOSIS — Z5189 Encounter for other specified aftercare: Secondary | ICD-10-CM | POA: Diagnosis not present

## 2016-10-03 DIAGNOSIS — I214 Non-ST elevation (NSTEMI) myocardial infarction: Secondary | ICD-10-CM | POA: Diagnosis not present

## 2016-10-05 ENCOUNTER — Encounter (HOSPITAL_COMMUNITY)
Admission: RE | Admit: 2016-10-05 | Discharge: 2016-10-05 | Disposition: A | Discharge status: Home / Self Care | Payer: Medicare Other | Source: Ambulatory Visit | Attending: Cardiology | Admitting: Cardiology

## 2016-10-05 DIAGNOSIS — Z5189 Encounter for other specified aftercare: Secondary | ICD-10-CM | POA: Diagnosis not present

## 2016-10-05 DIAGNOSIS — I214 Non-ST elevation (NSTEMI) myocardial infarction: Secondary | ICD-10-CM | POA: Diagnosis not present

## 2016-10-08 ENCOUNTER — Encounter (HOSPITAL_COMMUNITY)
Admission: RE | Admit: 2016-10-08 | Discharge: 2016-10-08 | Disposition: A | Discharge status: Home / Self Care | Payer: Medicare Other | Source: Ambulatory Visit | Attending: Cardiology | Admitting: Cardiology

## 2016-10-08 DIAGNOSIS — I214 Non-ST elevation (NSTEMI) myocardial infarction: Secondary | ICD-10-CM | POA: Diagnosis not present

## 2016-10-08 DIAGNOSIS — Z5189 Encounter for other specified aftercare: Secondary | ICD-10-CM | POA: Diagnosis not present

## 2016-10-09 ENCOUNTER — Ambulatory Visit (INDEPENDENT_AMBULATORY_CARE_PROVIDER_SITE_OTHER): Payer: Medicare Other | Admitting: *Deleted

## 2016-10-09 DIAGNOSIS — I255 Ischemic cardiomyopathy: Secondary | ICD-10-CM

## 2016-10-09 NOTE — Progress Notes (Signed)
Remote ICD transmission.   

## 2016-10-10 ENCOUNTER — Encounter: Payer: Self-pay | Admitting: Cardiology

## 2016-10-10 ENCOUNTER — Encounter (HOSPITAL_COMMUNITY)
Admission: RE | Admit: 2016-10-10 | Discharge: 2016-10-10 | Disposition: A | Discharge status: Home / Self Care | Payer: Medicare Other | Source: Ambulatory Visit | Attending: Cardiology | Admitting: Cardiology

## 2016-10-10 DIAGNOSIS — I214 Non-ST elevation (NSTEMI) myocardial infarction: Secondary | ICD-10-CM | POA: Diagnosis not present

## 2016-10-10 DIAGNOSIS — Z5189 Encounter for other specified aftercare: Secondary | ICD-10-CM | POA: Diagnosis not present

## 2016-10-12 ENCOUNTER — Encounter (HOSPITAL_COMMUNITY)
Admission: RE | Admit: 2016-10-12 | Discharge: 2016-10-12 | Disposition: A | Discharge status: Home / Self Care | Payer: Medicare Other | Source: Ambulatory Visit | Attending: Cardiology | Admitting: Cardiology

## 2016-10-12 DIAGNOSIS — I214 Non-ST elevation (NSTEMI) myocardial infarction: Secondary | ICD-10-CM | POA: Diagnosis not present

## 2016-10-12 DIAGNOSIS — Z5189 Encounter for other specified aftercare: Secondary | ICD-10-CM | POA: Diagnosis not present

## 2016-10-12 LAB — CUP PACEART REMOTE DEVICE CHECK
Brady Statistic AP VP Percent: 0.03 %
Brady Statistic AP VS Percent: 83.79 %
Brady Statistic AS VP Percent: 0.01 %
Brady Statistic RA Percent Paced: 83.81 %
Brady Statistic RV Percent Paced: 0.05 %
HIGH POWER IMPEDANCE MEASURED VALUE: 43 Ohm
HIGH POWER IMPEDANCE MEASURED VALUE: 59 Ohm
Implantable Lead Implant Date: 20090623
Implantable Lead Location: 753859
Implantable Lead Model: 5076
Implantable Lead Model: 6947
Lead Channel Impedance Value: 399 Ohm
Lead Channel Pacing Threshold Amplitude: 0.875 V
Lead Channel Pacing Threshold Amplitude: 1 V
Lead Channel Pacing Threshold Pulse Width: 0.4 ms
Lead Channel Sensing Intrinsic Amplitude: 1.75 mV
Lead Channel Sensing Intrinsic Amplitude: 6.5 mV
Lead Channel Sensing Intrinsic Amplitude: 6.5 mV
Lead Channel Setting Pacing Amplitude: 2 V
Lead Channel Setting Pacing Amplitude: 2.5 V
Lead Channel Setting Pacing Pulse Width: 0.4 ms
MDC IDC LEAD IMPLANT DT: 20090623
MDC IDC LEAD LOCATION: 753860
MDC IDC MSMT BATTERY REMAINING LONGEVITY: 116 mo
MDC IDC MSMT BATTERY VOLTAGE: 3.05 V
MDC IDC MSMT LEADCHNL RA PACING THRESHOLD PULSEWIDTH: 0.4 ms
MDC IDC MSMT LEADCHNL RA SENSING INTR AMPL: 1.75 mV
MDC IDC MSMT LEADCHNL RV IMPEDANCE VALUE: 304 Ohm
MDC IDC MSMT LEADCHNL RV IMPEDANCE VALUE: 361 Ohm
MDC IDC PG IMPLANT DT: 20170616
MDC IDC SESS DTM: 20171219143424
MDC IDC SET LEADCHNL RV SENSING SENSITIVITY: 0.6 mV
MDC IDC STAT BRADY AS VS PERCENT: 16.17 %

## 2016-10-17 ENCOUNTER — Encounter (HOSPITAL_COMMUNITY)
Admission: RE | Admit: 2016-10-17 | Discharge: 2016-10-17 | Disposition: A | Discharge status: Home / Self Care | Payer: Medicare Other | Source: Ambulatory Visit | Attending: Cardiology | Admitting: Cardiology

## 2016-10-17 DIAGNOSIS — Z5189 Encounter for other specified aftercare: Secondary | ICD-10-CM | POA: Diagnosis not present

## 2016-10-17 DIAGNOSIS — I214 Non-ST elevation (NSTEMI) myocardial infarction: Secondary | ICD-10-CM | POA: Diagnosis not present

## 2016-10-19 ENCOUNTER — Telehealth: Payer: Self-pay

## 2016-10-19 ENCOUNTER — Encounter (HOSPITAL_COMMUNITY)
Admission: RE | Admit: 2016-10-19 | Discharge: 2016-10-19 | Disposition: A | Discharge status: Home / Self Care | Payer: Medicare Other | Source: Ambulatory Visit | Attending: Cardiology | Admitting: Cardiology

## 2016-10-19 DIAGNOSIS — Z5189 Encounter for other specified aftercare: Secondary | ICD-10-CM | POA: Diagnosis not present

## 2016-10-19 DIAGNOSIS — I214 Non-ST elevation (NSTEMI) myocardial infarction: Secondary | ICD-10-CM

## 2016-10-19 NOTE — Progress Notes (Signed)
[name] was noted to have trigeminal PVC's while walking the track today at cardiac rehab. Patient asymptomatic. Blood pressure 122/80. Dr Hochrein's office called and notified.[name] said he took his medications as prescribed. Jesse Hefty NP paged and notified. No new orders received.Will continue to monitor the patient throughout  the program.

## 2016-10-19 NOTE — Telephone Encounter (Signed)
Referred to ICM clinic by Debroah Loop, device RN/Dr Caryl Comes.  Call to patient and provided ICM intro.  He agreed to monthly calls.  Advised remote transmission from 10/09/2016 was reviewed and it suggests he has fluid accumulating since 07/24/2016.  He denied any fluid symptoms.   He reported at cardiac rehab today, he was told his heart was doing something funny and they would contact Dr Percival Spanish about it.      Education given on limiting salt intake to 2000 mg daily and fluid intake to 64 oz daily.  He admits that he eats a lot of salt and really likes it on his foods.  Explained high salt intake can cause the fluid retention and the heart has difficulty getting rid of the fluid.  He stated he would try to adjust the salt intake.  Unable to provided ICM direct number and will do so at next contact.   Patient currently not on a diuretic.  Included remote transmission from 10/09/2016.    ICM remote transmission scheduled for 11/05/2016 to check fluid levels

## 2016-10-24 ENCOUNTER — Encounter (HOSPITAL_COMMUNITY)
Admission: RE | Admit: 2016-10-24 | Discharge: 2016-10-24 | Disposition: A | Discharge status: Home / Self Care | Payer: Medicare Other | Source: Ambulatory Visit | Attending: Cardiology | Admitting: Cardiology

## 2016-10-24 DIAGNOSIS — I214 Non-ST elevation (NSTEMI) myocardial infarction: Secondary | ICD-10-CM | POA: Diagnosis not present

## 2016-10-24 DIAGNOSIS — Z5189 Encounter for other specified aftercare: Secondary | ICD-10-CM | POA: Insufficient documentation

## 2016-10-25 NOTE — Progress Notes (Signed)
Cardiac Individual Treatment Plan  Patient Details  Name: [name] MRN: [id number] Date of Birth: Mar 27, 1931 Referring Provider:   Flowsheet Row CARDIAC REHAB PHASE II ORIENTATION from 08/30/2016 in [place]  Referring Provider  Marijo File MD      Initial Encounter Date:  [place] PHASE II ORIENTATION from 08/30/2016 in [place]  Date  08/30/16  Referring Provider  Marijo File MD      Visit Diagnosis: 03/2016 NSTEMI (non-ST elevated myocardial infarction) [facility])  Patient's Home Medications on Admission:  Current Outpatient Prescriptions:  .  allopurinol (ZYLOPRIM) 100 MG tablet, TAKE 1 TABLET ONCE DAILY., Disp: 30 tablet, Rfl: 5 .  amiodarone (PACERONE) 200 MG tablet, Take 1 tablet (200 mg total) by mouth daily., Disp: 30 tablet, Rfl: 5 .  carvedilol (COREG) 3.125 MG tablet, Take 1 tablet (3.125 mg total) by mouth 2 (two) times daily with a meal., Disp: 60 tablet, Rfl: 11 .  cholecalciferol (VITAMIN D) 1000 units tablet, Take 1,000 Units by mouth daily., Disp: , Rfl:  .  clopidogrel (PLAVIX) 75 MG tablet, Take 1 tablet by mouth daily., Disp: , Rfl: 4 .  Fluticasone-Salmeterol (ADVAIR DISKUS) 250-50 MCG/DOSE AEPB, Inhale 1 puff into the lungs 2 (two) times daily., Disp: 60 each, Rfl: 5 .  guaiFENesin (MUCINEX) 600 MG 12 hr tablet, Take 600 mg by mouth 2 (two) times daily., Disp: , Rfl:  .  levothyroxine (SYNTHROID, LEVOTHROID) 25 MCG tablet, Take 1 tablet (25 mcg total) by mouth daily before breakfast., Disp: 30 tablet, Rfl: 5 .  magnesium oxide (MAG-OX) 400 (241.3 Mg) MG tablet, Take 0.5 tablets (200 mg total) by mouth daily., Disp: 30 tablet, Rfl: 0 .  Multiple Vitamin (MULTIVITAMIN) capsule, Take 1 capsule by mouth daily., Disp: , Rfl:  .  Multiple Vitamins-Minerals (PRESERVISION/LUTEIN) CAPS, Take 1 capsule by mouth daily. , Disp: , Rfl:  .  OXYBUTYNIN CHLORIDE PO, Take 1 tablet by mouth  2 (two) times daily. , Disp: , Rfl:  .  polyethylene glycol powder (GLYCOLAX/MIRALAX) powder, DISSOLVE ONE CAPFUL IN 8 0Z. WATER TWICE DAILY., Disp: 527 g, Rfl: 0 .  pravastatin (PRAVACHOL) 40 MG tablet, Take 1 tablet (40 mg total) by mouth daily., Disp: 90 tablet, Rfl: 3 .  senna-docusate (SENOKOT-S) 8.6-50 MG tablet, Take 1 tablet by mouth 2 (two) times daily., Disp: 60 tablet, Rfl: 0 .  sertraline (ZOLOFT) 50 MG tablet, Take 1 tablet (50 mg total) by mouth at bedtime., Disp: 30 tablet, Rfl: 5 .  thiamine 100 MG tablet, Take 1 tablet (100 mg total) by mouth daily., Disp: 30 tablet, Rfl: 0  Past Medical History: Past Medical History:  Diagnosis Date  . AAA (abdominal aortic aneurysm) ([place])    a. 2002 s/p repair.  . Abnormal liver function tests   . AICD (automatic cardioverter/defibrillator) present   . Allergic rhinitis   . Atherosclerotic heart disease   . Back pain   . CAD (coronary artery disease)    a. 02/2002 H/o MI with stenting x 2; b. 04/2013 MV: EF 25%, large posterior lateral infarct w/o ischemia; c. 03/2016 VT Arrest/Cath: LM 60ost, LAD 40p/m ISR, LCX 100p/m, RCA nl-->Med Rx.  . Carotid arterial disease ([place])    a. 12/2010 s/p R CEA;  b. 05/2015 Carotid U/S: bilat <40% ICA stenosis.  . Chronic combined systolic and diastolic CHF (congestive heart failure) ([place])    a. 03/2016 Echo: Ef 35-40%, grade 1 DD.  Marland Kitchen  Compression fracture   . COPD (chronic obstructive pulmonary disease) (H[facility])   . Coronary artery disease   . Dermatitis   . Diverticulosis of colon   . DJD (degenerative joint disease)    and Gout  . Hemorrhoids   . History of colonic polyps   . History of gout   . History of pneumonia   . Hypercholesterolemia   . Hypertension   . Hypertensive heart disease   . Ischemic cardiomyopathy    a. EF prev <35%-->improved to normal by Echo 8/14:  Mild LVH, focal basal hypertrophy, EF 60-65%, normal wall motion, mild BAE, PASP 36; c. 03/2016 Echo: EF 35-40%, Gr1 DD, triv AI, mild  MR, mod dil LA, mild-mod TR, PASP 43mmHg.  . Osteoporosis   . Peripheral vascular disease (HCC)   . Presence of cardiac defibrillator    a. 03/2008 s/p MDT [id number] Maximo II DR, DC AICD; b. 03/2013: ICD shock for T wave oversensing;  c. 10/2015: collective decision not to replace ICD given improvement in LV fxn; d. VT Arrest-->Gen change to MDT ser # [id number].  . Skin cancer    shoulders and forehead  . Syncope   . T wave over sensing resulting in inappropriate shocks    a. 03/2013.  . Tobacco abuse   . Ventricular tachycardia (HCC) 04/01/2016    Tobacco Use: History  Smoking Status  . Former Smoker  . Types: Cigarettes, Cigars  . Quit date: 10/22/2012  Smokeless Tobacco  . Never Used    Comment: quit about 10 years ago    Labs: Recent Review Flowsheet Data    Labs for ITP Cardiac and Pulmonary Rehab Latest Ref Rng & Units 05/11/2015 03/29/2016 03/29/2016 03/30/2016 04/02/2016   Cholestrol 0 - 200 mg/dL 127 - - - -   LDLCALC 0 - 99 mg/dL 57 - - - -   LDLDIRECT mg/dL - - - - -   HDL >39.00 mg/dL 41.70 - - - -   Trlycerides 0.0 - 149.0 mg/dL 140.0 - - - -   Hemoglobin A1c 4.8 - 5.6 % - - - - 5.1   PHART 7.350 - 7.450 - - 7.283(L) 7.466(H) -   PCO2ART 35.0 - 45.0 mmHg - - 29.2(L) 25.6(L) -   HCO3 20.0 - 24.0 mEq/L - - 13.8(L) 18.2(L) -   TCO2 0 - 100 mmol/L - 16 15 19.0 -   ACIDBASEDEF 0.0 - 2.0 mmol/L - - 12.0(H) 4.9(H) -   O2SAT % - - 100.0 99.0 -      Capillary Blood Glucose: Lab Results  Component Value Date   GLUCAP 140 (H) 04/07/2016   GLUCAP 121 (H) 04/01/2016   GLUCAP 100 (H) 04/01/2016   GLUCAP 119 (H) 03/31/2016   GLUCAP 113 (H) 03/31/2016     Exercise Target Goals:    Exercise Program Goal: Individual exercise prescription set with THRR, safety & activity barriers. Participant demonstrates ability to understand and report RPE using BORG scale, to self-measure pulse accurately, and to acknowledge the importance of the exercise prescription.  Exercise Prescription  Goal: Starting with aerobic activity 30 plus minutes a day, 3 days per week for initial exercise prescription. Provide home exercise prescription and guidelines that participant acknowledges understanding prior to discharge.  Activity Barriers & Risk Stratification:     Activity Barriers & Cardiac Risk Stratification - 08/30/16 0911      Activity Barriers & Cardiac Risk Stratification   Activity Barriers Back Problems   Cardiac Risk Stratification High        6 Minute Walk:     6 Minute Walk    Row Name 08/30/16 1251         6 Minute Walk   Phase Initial     Distance 1021 feet     Walk Time 6 minutes     # of Rest Breaks 0     MPH 1.9     METS 1.6     RPE 11     VO2 Peak 5.7     Symptoms No     Resting HR 60 bpm     Resting BP 100/60     Max Ex. HR 60 bpm     Max Ex. BP 132/74     2 Minute Post BP 132/70        Initial Exercise Prescription:     Initial Exercise Prescription - 08/30/16 1200      Date of Initial Exercise RX and Referring Provider   Date 08/30/16   Referring Provider Marijo File MD     Recumbant Bike   Level --   Minutes --   METs --     NuStep   Level 1   Minutes 10   METs 1.5     Arm Ergometer   Level 1   Minutes 10   METs 2.7     Track   Laps 8   Minutes 10   METs 2.39     Prescription Details   Frequency (times per week) 3   Duration Progress to 45 minutes of aerobic exercise without signs/symptoms of physical distress     Intensity   THRR 40-80% of Max Heartrate 55-110   Ratings of Perceived Exertion 11-13   Perceived Dyspnea 0-4     Progression   Progression Continue to progress workloads to maintain intensity without signs/symptoms of physical distress.     Resistance Training   Training Prescription Yes   Weight 2   Reps 10-12      Perform Capillary Blood Glucose checks as needed.  Exercise Prescription Changes:     Exercise Prescription Changes    Row Name 09/26/16 1600 10/24/16 1400            Exercise Review   Progression  - Yes        Response to Exercise   Blood Pressure (Admit) 98/64 114/68      Blood Pressure (Exercise) 104/60 122/80      Blood Pressure (Exit) 112/60 120/70      Heart Rate (Admit) 60 bpm 68 bpm      Heart Rate (Exercise) 91 bpm 85 bpm      Heart Rate (Exit) 60 bpm 60 bpm      Rating of Perceived Exertion (Exercise) 12 13      Duration Progress to 45 minutes of aerobic exercise without signs/symptoms of physical distress Progress to 45 minutes of aerobic exercise without signs/symptoms of physical distress      Intensity THRR unchanged THRR unchanged        Progression   Progression Continue to progress workloads to maintain intensity without signs/symptoms of physical distress. Continue to progress workloads to maintain intensity without signs/symptoms of physical distress.      Average METs 2.4 2.4        Resistance Training   Training Prescription Yes Yes      Weight 2 2      Reps 10-12 10-12        NuStep   Level 1 3  Minutes 10 10      METs 2.1 1.3        Arm Ergometer   Level 1 1      Minutes 10 10      METs 2.76 2.72        Track   Laps 11 9      Minutes 10 10      METs 2.92 2.57        Home Exercise Plan   Plans to continue exercise at  - Home      Frequency  - Add 2 additional days to program exercise sessions.         Exercise Comments:     Exercise Comments    Row Name 09/26/16 1643 10/24/16 1427         Exercise Comments Reviewed METs and goals with pt.  Pt is off to a great start and is tolerating exercise well.   Reviewed METs and goals with pt.  Pt is doing well with exercise          Discharge Exercise Prescription (Final Exercise Prescription Changes):     Exercise Prescription Changes - 10/24/16 1400      Exercise Review   Progression Yes     Response to Exercise   Blood Pressure (Admit) 114/68   Blood Pressure (Exercise) 122/80   Blood Pressure (Exit) 120/70   Heart Rate (Admit) 68 bpm   Heart  Rate (Exercise) 85 bpm   Heart Rate (Exit) 60 bpm   Rating of Perceived Exertion (Exercise) 13   Duration Progress to 45 minutes of aerobic exercise without signs/symptoms of physical distress   Intensity THRR unchanged     Progression   Progression Continue to progress workloads to maintain intensity without signs/symptoms of physical distress.   Average METs 2.4     Resistance Training   Training Prescription Yes   Weight 2   Reps 10-12     NuStep   Level 3   Minutes 10   METs 1.3     Arm Ergometer   Level 1   Minutes 10   METs 2.72     Track   Laps 9   Minutes 10   METs 2.57     Home Exercise Plan   Plans to continue exercise at Home   Frequency Add 2 additional days to program exercise sessions.      Nutrition:  Target Goals: Understanding of nutrition guidelines, daily intake of sodium <1526m, cholesterol <2068m calories 30% from fat and 7% or less from saturated fats, daily to have 5 or more servings of fruits and vegetables.  Biometrics:     Pre Biometrics - 08/30/16 1553      Pre Biometrics   Waist Circumference 42.25 inches   Hip Circumference 39.5 inches   Waist to Hip Ratio 1.07 %   Triceps Skinfold 10 mm   % Body Fat 25.8 %   Grip Strength 30 kg   Flexibility 0 in   Single Leg Stand 0.5 seconds       Nutrition Therapy Plan and Nutrition Goals:     Nutrition Therapy & Goals - 09/17/16 0918      Nutrition Therapy   Diet Therapeutic Lifestyle Changes     Personal Nutrition Goals   Personal Goal #1 Maintain wt of 162 lb (73.8 kg) while in [place] and counsel regarding individualized specific dietary modifications aiming towards targeted  core components such as weight, hypertension, lipid management, diabetes, heart failure and other comorbidities.   Expected Outcomes Short Term Goal: Understand basic principles of dietary content, such as calories, fat, sodium, cholesterol and  nutrients.;Long Term Goal: Adherence to prescribed nutrition plan.      Nutrition Discharge: Nutrition Scores:     Nutrition Assessments - 09/17/16 1049      MEDFICTS Scores   Pre Score 68      Nutrition Goals Re-Evaluation:   Psychosocial: Target Goals: Acknowledge presence or absence of depression, maximize coping skills, provide positive support system. Participant is able to verbalize types and ability to use techniques and skills needed for reducing stress and depression.  Initial Review & Psychosocial Screening:     Initial Psych Review & Screening - 08/30/16 1705      Family Dynamics   Good Support System? Yes   Comments Daughter present for orientation.  Brief assessment reveals no identifiable needs, no further intervention warranted     Barriers   Psychosocial barriers to participate in program There are no identifiable barriers or psychosocial needs.      Quality of Life Scores:     Quality of Life - 08/30/16 1425      Quality of Life Scores   Health/Function Pre 17.79 %   Socioeconomic Pre 22.64 %   Psych/Spiritual Pre 18.43 %   Family Pre 24.75 %   GLOBAL Pre 19.86 %      PHQ-9: Recent Review Flowsheet Data    Depression screen PHQ 2/9 07/09/2016 05/10/2016 06/08/2013   Decreased Interest 1 3 0   Down, Depressed, Hopeless 0 2 0   PHQ - 2 Score 1 5 0   Altered sleeping - 0 -   Tired, decreased energy - 3 -   Change in appetite - 3 -   Feeling bad or failure about yourself  - 3 -   Trouble concentrating - 0 -   Moving slowly or fidgety/restless - 0 -   Suicidal thoughts - 0 -   PHQ-9 Score - 14 -   Difficult doing work/chores - Somewhat difficult -      Psychosocial Evaluation and Intervention:     Psychosocial Evaluation - 08/30/16 1706      Discharge Psychosocial Assessment & Intervention   Discharge Continue support measures as needed      Psychosocial Re-Evaluation:     Psychosocial Re-Evaluation    Row Name 09/21/16 1610  10/25/16 1129           Psychosocial Re-Evaluation   Interventions Encouraged to attend Cardiac Rehabilitation for the exercise Encouraged to attend Cardiac Rehabilitation for the exercise      Continued Psychosocial Services Needed No No         Vocational Rehabilitation: Provide vocational rehab assistance to qualifying candidates.   Vocational Rehab Evaluation & Intervention:     Vocational Rehab - 08/30/16 1706      Initial Vocational Rehab Evaluation & Intervention   Assessment shows need for Vocational Rehabilitation No  Pt does not plan to return to competitive employment.  Pt is retired.      Education: Education Goals: Education classes will be provided on a weekly basis, covering required topics. Participant will state understanding/return demonstration of topics presented.  Learning Barriers/Preferences:     Learning Barriers/Preferences - 08/30/16 0910      Learning Barriers/Preferences   Learning Barriers Sight;Hearing   Learning Preferences Written Material;Verbal Instruction;Skilled Demonstration      Education Topics: Count   Your Pulse:  -Group instruction provided by verbal instruction, demonstration, patient participation and written materials to support subject.  Instructors address importance of being able to find your pulse and how to count your pulse when at home without a heart monitor.  Patients get hands on experience counting their pulse with staff help and individually.   Heart Attack, Angina, and Risk Factor Modification:  -Group instruction provided by verbal instruction, video, and written materials to support subject.  Instructors address signs and symptoms of angina and heart attacks.    Also discuss risk factors for heart disease and how to make changes to improve heart health risk factors.   Functional Fitness:  -Group instruction provided by verbal instruction, demonstration, patient participation, and written materials to support  subject.  Instructors address safety measures for doing things around the house.  Discuss how to get up and down off the floor, how to pick things up properly, how to safely get out of a chair without assistance, and balance training.   Meditation and Mindfulness:  -Group instruction provided by verbal instruction, patient participation, and written materials to support subject.  Instructor addresses importance of mindfulness and meditation practice to help reduce stress and improve awareness.  Instructor also leads participants through a meditation exercise.  Flowsheet Row CARDIAC REHAB PHASE II EXERCISE from 10/17/2016 in [facility]  Date  10/17/16  Instruction Review Code  2- meets goals/outcomes      Stretching for Flexibility and Mobility:  -Group instruction provided by verbal instruction, patient participation, and written materials to support subject.  Instructors lead participants through series of stretches that are designed to increase flexibility thus improving mobility.  These stretches are additional exercise for major muscle groups that are typically performed during regular warm up and cool down. Flowsheet Row CARDIAC REHAB PHASE II EXERCISE from 10/17/2016 in [facility]  Date  09/26/16  Educator  Ashley Armstrong  Instruction Review Code  2- meets goals/outcomes      Hands Only CPR Anytime:  -Group instruction provided by verbal instruction, video, patient participation and written materials to support subject.  Instructors co-teach with AHA video for hands only CPR.  Participants get hands on experience with mannequins.   Nutrition I class: Heart Healthy Eating:  -Group instruction provided by PowerPoint slides, verbal discussion, and written materials to support subject matter. The instructor gives an explanation and review of the Therapeutic Lifestyle Changes diet recommendations, which includes a discussion  on lipid goals, dietary fat, sodium, fiber, plant stanol/sterol esters, sugar, and the components of a well-balanced, healthy diet.   Nutrition II class: Lifestyle Skills:  -Group instruction provided by PowerPoint slides, verbal discussion, and written materials to support subject matter. The instructor gives an explanation and review of label reading, grocery shopping for heart health, heart healthy recipe modifications, and ways to make healthier choices when eating out.   Diabetes Question & Answer:  -Group instruction provided by PowerPoint slides, verbal discussion, and written materials to support subject matter. The instructor gives an explanation and review of diabetes co-morbidities, pre- and post-prandial blood glucose goals, pre-exercise blood glucose goals, signs, symptoms, and treatment of hypoglycemia and hyperglycemia, and foot care basics.   Diabetes Blitz:  -Group instruction provided by PowerPoint slides, verbal discussion, and written materials to support subject matter. The instructor gives an explanation and review of the physiology behind type 1 and type 2 diabetes, diabetes medications and rational behind using different medications, pre- and   post-prandial blood glucose recommendations and Hemoglobin A1c goals, diabetes diet, and exercise including blood glucose guidelines for exercising safely.    Portion Distortion:  -Group instruction provided by PowerPoint slides, verbal discussion, written materials, and food models to support subject matter. The instructor gives an explanation of serving size versus portion size, changes in portions sizes over the last 20 years, and what consists of a serving from each food group. Flowsheet Row CARDIAC REHAB PHASE II EXERCISE from 10/17/2016 in [facility]  Date  09/19/16 [Holiday Eating Survival Tips]  Educator  RD  Instruction Review Code  2- meets goals/outcomes      Stress Management:  -Group  instruction provided by verbal instruction, video, and written materials to support subject matter.  Instructors review role of stress in heart disease and how to cope with stress positively.     Exercising on Your Own:  -Group instruction provided by verbal instruction, power point, and written materials to support subject.  Instructors discuss benefits of exercise, components of exercise, frequency and intensity of exercise, and end points for exercise.  Also discuss use of nitroglycerin and activating EMS.  Review options of places to exercise outside of rehab.  Review guidelines for sex with heart disease.   Cardiac Drugs I:  -Group instruction provided by verbal instruction and written materials to support subject.  Instructor reviews cardiac drug classes: antiplatelets, anticoagulants, beta blockers, and statins.  Instructor discusses reasons, side effects, and lifestyle considerations for each drug class. Flowsheet Row CARDIAC REHAB PHASE II EXERCISE from 10/17/2016 in [name]  Date  10/03/16  Instruction Review Code  2- meets goals/outcomes      Cardiac Drugs II:  -Group instruction provided by verbal instruction and written materials to support subject.  Instructor reviews cardiac drug classes: angiotensin converting enzyme inhibitors (ACE-I), angiotensin II receptor blockers (ARBs), nitrates, and calcium channel blockers.  Instructor discusses reasons, side effects, and lifestyle considerations for each drug class.   Anatomy and Physiology of the Circulatory System:  -Group instruction provided by verbal instruction, video, and written materials to support subject.  Reviews functional anatomy of heart, how it relates to various diagnoses, and what role the heart plays in the overall system. Flowsheet Row CARDIAC REHAB PHASE II EXERCISE from 10/17/2016 in [facility]  Date  10/10/16  Instruction Review Code  2- meets  goals/outcomes      Knowledge Questionnaire Score:     Knowledge Questionnaire Score - 08/30/16 1251      Knowledge Questionnaire Score   Pre Score 20/24      Core Components/Risk Factors/Patient Goals at Admission:     Personal Goals and Risk Factors at Admission - 08/30/16 0912      Core Components/Risk Factors/Patient Goals on Admission   Increase Strength and Stamina Yes   Intervention Provide advice, education, support and counseling about physical activity/exercise needs.;Develop an individualized exercise prescription for aerobic and resistive training based on initial evaluation findings, risk stratification, comorbidities and participant's personal goals.   Expected Outcomes Achievement of increased cardiorespiratory fitness and enhanced flexibility, muscular endurance and strength shown through measurements of functional capacity and personal statement of participant.   Hypertension Yes   Intervention Provide education on lifestyle modifcations including regular physical activity/exercise, weight management, moderate sodium restriction and increased consumption of fresh fruit, vegetables, and low fat dairy, alcohol moderation, and smoking cessation.;Monitor prescription use compliance.   Expected Outcomes Short Term: Continued assessment and   intervention until BP is < 140/20m HG in hypertensive participants. < 130/888mHG in hypertensive participants with diabetes, heart failure or chronic kidney disease.;Long Term: Maintenance of blood pressure at goal levels.   Lipids Yes   Intervention Provide education and support for participant on nutrition & aerobic/resistive exercise along with prescribed medications to achieve LDL <7075mHDL >27m14m Expected Outcomes Short Term: Participant states understanding of desired cholesterol values and is compliant with medications prescribed. Participant is following exercise prescription and nutrition guidelines.;Long Term: Cholesterol  controlled with medications as prescribed, with individualized exercise RX and with personalized nutrition plan. Value goals: LDL < 70mg39mL > 40 mg.   Stress Yes   Intervention Offer individual and/or small group education and counseling on adjustment to heart disease, stress management and health-related lifestyle change. Teach and support self-help strategies.;Refer participants experiencing significant psychosocial distress to appropriate mental health specialists for further evaluation and treatment. When possible, include family members and significant others in education/counseling sessions.   Expected Outcomes Short Term: Participant demonstrates changes in health-related behavior, relaxation and other stress management skills, ability to obtain effective social support, and compliance with psychotropic medications if prescribed.;Long Term: Emotional wellbeing is indicated by absence of clinically significant psychosocial distress or social isolation.   Personal Goal Other Yes   Personal Goal get driver's license, improve stamina and increase strength   Intervention Provide education classes (nutrition, functional fitness, medication management, ect) and an individualized exercise program   Expected Outcomes Imrove overall functional ability       Core Components/Risk Factors/Patient Goals Review:      Goals and Risk Factor Review    Row Name 09/26/16 1644 10/24/16 1438           Core Components/Risk Factors/Patient Goals Review   Personal Goals Review Increase Strength and Stamina;Other Increase Strength and Stamina;Other      Review Pt states he feels about the same prior to CRPII but he is tolerating exercise well and says he feels good overall.   He states that he feels stronger and he has more energy      Expected Outcomes Continue with exercise routine and home exercise program and increase workload as tolerated in order to improve strength and stamina Continue with exercise  routine and home exercise program and increase workload as tolerated in order to improve strength and stamina         Core Components/Risk Factors/Patient Goals at Discharge (Final Review):      Goals and Risk Factor Review - 10/24/16 1438      Core Components/Risk Factors/Patient Goals Review   Personal Goals Review Increase Strength and Stamina;Other   Review He states that he feels stronger and he has more energy   Expected Outcomes Continue with exercise routine and home exercise program and increase workload as tolerated in order to improve strength and stamina      ITP Comments:     ITP Comments    Row Name 08/30/16 0912 10/19/16 1448         ITP Comments TurneFransico Himttended CPR education class.  outcomes and goals met.          Comments: Wildon Woodardaking expected progress toward personal goals after completing 17 sessions. Recommend continued exercise and life style modification education including  stress management and relaxation techniques to decrease cardiac risk profile. Tung Daymeinnjoying participating in phase 2 cardiac rehab.MariaBarnet PallBSN 10/25/2016 12:00 PM

## 2016-10-26 ENCOUNTER — Encounter: Payer: Self-pay | Admitting: Cardiology

## 2016-10-26 ENCOUNTER — Encounter (HOSPITAL_COMMUNITY)
Admission: RE | Admit: 2016-10-26 | Discharge: 2016-10-26 | Disposition: A | Discharge status: Home / Self Care | Payer: Medicare Other | Source: Ambulatory Visit | Attending: Cardiology | Admitting: Cardiology

## 2016-10-29 ENCOUNTER — Encounter (HOSPITAL_COMMUNITY)
Admission: RE | Admit: 2016-10-29 | Discharge: 2016-10-29 | Disposition: A | Discharge status: Home / Self Care | Payer: Medicare Other | Source: Ambulatory Visit | Attending: Cardiology | Admitting: Cardiology

## 2016-10-29 DIAGNOSIS — I214 Non-ST elevation (NSTEMI) myocardial infarction: Secondary | ICD-10-CM | POA: Diagnosis not present

## 2016-10-29 DIAGNOSIS — Z5189 Encounter for other specified aftercare: Secondary | ICD-10-CM | POA: Diagnosis not present

## 2016-10-31 ENCOUNTER — Encounter (HOSPITAL_COMMUNITY)
Admission: RE | Admit: 2016-10-31 | Discharge: 2016-10-31 | Disposition: A | Discharge status: Home / Self Care | Payer: Medicare Other | Source: Ambulatory Visit | Attending: Cardiology | Admitting: Cardiology

## 2016-10-31 DIAGNOSIS — I214 Non-ST elevation (NSTEMI) myocardial infarction: Secondary | ICD-10-CM | POA: Diagnosis not present

## 2016-10-31 DIAGNOSIS — Z5189 Encounter for other specified aftercare: Secondary | ICD-10-CM | POA: Diagnosis not present

## 2016-11-02 ENCOUNTER — Encounter (HOSPITAL_COMMUNITY)
Admission: RE | Admit: 2016-11-02 | Discharge: 2016-11-02 | Disposition: A | Discharge status: Home / Self Care | Payer: Medicare Other | Source: Ambulatory Visit | Attending: Cardiology | Admitting: Cardiology

## 2016-11-02 DIAGNOSIS — I214 Non-ST elevation (NSTEMI) myocardial infarction: Secondary | ICD-10-CM

## 2016-11-02 DIAGNOSIS — Z5189 Encounter for other specified aftercare: Secondary | ICD-10-CM | POA: Diagnosis not present

## 2016-11-05 ENCOUNTER — Telehealth: Payer: Self-pay | Admitting: Cardiology

## 2016-11-05 ENCOUNTER — Encounter (HOSPITAL_COMMUNITY)
Admission: RE | Admit: 2016-11-05 | Discharge: 2016-11-05 | Disposition: A | Discharge status: Home / Self Care | Payer: Medicare Other | Source: Ambulatory Visit | Attending: Cardiology | Admitting: Cardiology

## 2016-11-05 ENCOUNTER — Ambulatory Visit (INDEPENDENT_AMBULATORY_CARE_PROVIDER_SITE_OTHER): Payer: Medicare Other

## 2016-11-05 DIAGNOSIS — I214 Non-ST elevation (NSTEMI) myocardial infarction: Secondary | ICD-10-CM | POA: Diagnosis not present

## 2016-11-05 DIAGNOSIS — I5042 Chronic combined systolic (congestive) and diastolic (congestive) heart failure: Secondary | ICD-10-CM

## 2016-11-05 DIAGNOSIS — I255 Ischemic cardiomyopathy: Secondary | ICD-10-CM

## 2016-11-05 DIAGNOSIS — Z95810 Presence of automatic (implantable) cardiac defibrillator: Secondary | ICD-10-CM

## 2016-11-05 DIAGNOSIS — Z5189 Encounter for other specified aftercare: Secondary | ICD-10-CM | POA: Diagnosis not present

## 2016-11-05 NOTE — Telephone Encounter (Signed)
Spoke with pt and reminded pt of remote transmission that is due today. Pt verbalized understanding.   

## 2016-11-06 ENCOUNTER — Ambulatory Visit (INDEPENDENT_AMBULATORY_CARE_PROVIDER_SITE_OTHER): Payer: Medicare Other | Admitting: Pulmonary Disease

## 2016-11-06 ENCOUNTER — Telehealth: Payer: Self-pay

## 2016-11-06 VITALS — BP 122/70 | HR 60 | Temp 97.1°F | Ht 71.0 in | Wt 165.2 lb

## 2016-11-06 DIAGNOSIS — I2510 Atherosclerotic heart disease of native coronary artery without angina pectoris: Secondary | ICD-10-CM | POA: Diagnosis not present

## 2016-11-06 DIAGNOSIS — I5042 Chronic combined systolic (congestive) and diastolic (congestive) heart failure: Secondary | ICD-10-CM | POA: Diagnosis not present

## 2016-11-06 DIAGNOSIS — Z95810 Presence of automatic (implantable) cardiac defibrillator: Secondary | ICD-10-CM | POA: Diagnosis not present

## 2016-11-06 DIAGNOSIS — J449 Chronic obstructive pulmonary disease, unspecified: Secondary | ICD-10-CM | POA: Diagnosis not present

## 2016-11-06 DIAGNOSIS — I10 Essential (primary) hypertension: Secondary | ICD-10-CM | POA: Diagnosis not present

## 2016-11-06 DIAGNOSIS — M159 Polyosteoarthritis, unspecified: Secondary | ICD-10-CM

## 2016-11-06 DIAGNOSIS — I739 Peripheral vascular disease, unspecified: Secondary | ICD-10-CM

## 2016-11-06 DIAGNOSIS — I480 Paroxysmal atrial fibrillation: Secondary | ICD-10-CM | POA: Diagnosis not present

## 2016-11-06 DIAGNOSIS — Z9861 Coronary angioplasty status: Secondary | ICD-10-CM | POA: Diagnosis not present

## 2016-11-06 DIAGNOSIS — I255 Ischemic cardiomyopathy: Secondary | ICD-10-CM | POA: Diagnosis not present

## 2016-11-06 DIAGNOSIS — M150 Primary generalized (osteo)arthritis: Secondary | ICD-10-CM | POA: Diagnosis not present
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
## 2016-11-06 NOTE — Progress Notes (Signed)
EPIC Encounter for ICM Monitoring  Patient Name: [name] is a 83 y.o. male Date: 11/06/2016 Primary Care Physican: Noralee Space, MD Primary Cardiologist: Hochrein Electrophysiologist: Faustino Congress Weight: unknown       1st ICM encounter.  Attempted ICM call and unable to reach.  Left message to return call.  Transmission reviewed.   Thoracic impedance abnormal suggesting fluid accumulation since October 2017.  Fluid index increased starting in November and is > threshold  Recommendations:   NONE- Unable to reach.    Follow-up plan: ICM clinic phone appointment on 11/15/2016 to recheck fluid levels.  Copy of ICM check sent to primary cardiologist and device physician.   3 month ICM trend: 11/06/2016   1 Year ICM trend:      [name], RN 11/06/2016 9:57 AM

## 2016-11-06 NOTE — Telephone Encounter (Signed)
Attempted 1st ICM call to patient.  Left message for return call.

## 2016-11-06 NOTE — Patient Instructions (Signed)
Today we updated your med list in our EPIC system...    Continue your current medications the same...  Please be safe especially around snow & ice...    Google "snow cleats" on [employer] to get a good pair of "grippers" for your shoes...  Keep up the good work w/ Cardiac Rehab...  Call for any questions...  Let's plan a follow up visit in 38mo w/ FASTING blood work at that time.Marland KitchenMarland Kitchen

## 2016-11-07 ENCOUNTER — Encounter (HOSPITAL_COMMUNITY): Payer: Medicare Other

## 2016-11-09 ENCOUNTER — Encounter (HOSPITAL_COMMUNITY)
Admission: RE | Admit: 2016-11-09 | Discharge: 2016-11-09 | Disposition: A | Discharge status: Home / Self Care | Payer: Medicare Other | Source: Ambulatory Visit | Attending: Cardiology | Admitting: Cardiology

## 2016-11-09 DIAGNOSIS — I214 Non-ST elevation (NSTEMI) myocardial infarction: Secondary | ICD-10-CM

## 2016-11-09 DIAGNOSIS — Z5189 Encounter for other specified aftercare: Secondary | ICD-10-CM | POA: Diagnosis not present

## 2016-11-10 ENCOUNTER — Other Ambulatory Visit: Payer: Self-pay | Admitting: Pulmonary Disease

## 2016-11-11 NOTE — Progress Notes (Signed)
Need to get this patient in for an appt this week.

## 2016-11-12 ENCOUNTER — Encounter (HOSPITAL_COMMUNITY)
Admission: RE | Admit: 2016-11-12 | Discharge: 2016-11-12 | Disposition: A | Discharge status: Home / Self Care | Payer: Medicare Other | Source: Ambulatory Visit | Attending: Cardiology | Admitting: Cardiology

## 2016-11-12 DIAGNOSIS — I214 Non-ST elevation (NSTEMI) myocardial infarction: Secondary | ICD-10-CM | POA: Diagnosis not present

## 2016-11-12 DIAGNOSIS — Z5189 Encounter for other specified aftercare: Secondary | ICD-10-CM | POA: Diagnosis not present

## 2016-11-13 ENCOUNTER — Ambulatory Visit (INDEPENDENT_AMBULATORY_CARE_PROVIDER_SITE_OTHER): Payer: Medicare Other | Admitting: Cardiology

## 2016-11-13 ENCOUNTER — Encounter: Payer: Self-pay | Admitting: Cardiology

## 2016-11-13 VITALS — BP 106/65 | HR 59 | Ht 71.0 in | Wt 169.4 lb

## 2016-11-13 DIAGNOSIS — I951 Orthostatic hypotension: Secondary | ICD-10-CM | POA: Diagnosis not present

## 2016-11-13 DIAGNOSIS — Z79899 Other long term (current) drug therapy: Secondary | ICD-10-CM

## 2016-11-13 DIAGNOSIS — I255 Ischemic cardiomyopathy: Secondary | ICD-10-CM | POA: Diagnosis not present

## 2016-11-13 DIAGNOSIS — I5042 Chronic combined systolic (congestive) and diastolic (congestive) heart failure: Secondary | ICD-10-CM | POA: Diagnosis not present
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
## 2016-11-13 NOTE — Patient Instructions (Addendum)
Medication Instructions:  Continue current medications  Labwork: BMP  Testing/Procedures: None Ordered  Follow-Up: Your physician recommends that you schedule a follow-up appointment in: 3 Months   Any Other Special Instructions Will Be Listed Below (If Applicable).   If you need a refill on your cardiac medications before your next appointment, please call your pharmacy.

## 2016-11-13 NOTE — Progress Notes (Addendum)
Spoke with patient.  He stated he is feeling fine and denied any fluid symptoms.  He stated current weight is 165 lbs which is increase from a few days ago which was 162 lbs.  He stated he has an appointment with Dr Percival Spanish today.  Advised would recheck fluid levels on 11/20/2016.

## 2016-11-14 ENCOUNTER — Encounter (HOSPITAL_COMMUNITY)
Admission: RE | Admit: 2016-11-14 | Discharge: 2016-11-14 | Disposition: A | Discharge status: Home / Self Care | Payer: Medicare Other | Source: Ambulatory Visit | Attending: Cardiology | Admitting: Cardiology

## 2016-11-14 DIAGNOSIS — I214 Non-ST elevation (NSTEMI) myocardial infarction: Secondary | ICD-10-CM

## 2016-11-14 DIAGNOSIS — Z5189 Encounter for other specified aftercare: Secondary | ICD-10-CM | POA: Diagnosis not present

## 2016-11-14 LAB — BASIC METABOLIC PANEL
BUN: 17 mg/dL (ref 7–25)
CALCIUM: 9.3 mg/dL (ref 8.6–10.3)
CHLORIDE: 103 mmol/L (ref 98–110)
CO2: 27 mmol/L (ref 20–31)
Creat: 1.13 mg/dL — ABNORMAL HIGH (ref 0.70–1.11)
GLUCOSE: 62 mg/dL — AB (ref 65–99)
POTASSIUM: 4.6 mmol/L (ref 3.5–5.3)
SODIUM: 139 mmol/L (ref 135–146)

## 2016-11-16 ENCOUNTER — Encounter (HOSPITAL_COMMUNITY)
Admission: RE | Admit: 2016-11-16 | Discharge: 2016-11-16 | Disposition: A | Discharge status: Home / Self Care | Payer: Medicare Other | Source: Ambulatory Visit | Attending: Cardiology | Admitting: Cardiology

## 2016-11-16 DIAGNOSIS — I214 Non-ST elevation (NSTEMI) myocardial infarction: Secondary | ICD-10-CM

## 2016-11-16 DIAGNOSIS — Z5189 Encounter for other specified aftercare: Secondary | ICD-10-CM | POA: Diagnosis not present

## 2016-11-17 ENCOUNTER — Other Ambulatory Visit: Payer: Self-pay | Admitting: Pulmonary Disease

## 2016-11-19 ENCOUNTER — Encounter (HOSPITAL_COMMUNITY)
Admission: RE | Admit: 2016-11-19 | Discharge: 2016-11-19 | Disposition: A | Discharge status: Home / Self Care | Payer: Medicare Other | Source: Ambulatory Visit | Attending: Cardiology | Admitting: Cardiology

## 2016-11-19 DIAGNOSIS — I214 Non-ST elevation (NSTEMI) myocardial infarction: Secondary | ICD-10-CM

## 2016-11-19 DIAGNOSIS — Z5189 Encounter for other specified aftercare: Secondary | ICD-10-CM | POA: Diagnosis not present

## 2016-11-20 ENCOUNTER — Ambulatory Visit (INDEPENDENT_AMBULATORY_CARE_PROVIDER_SITE_OTHER): Payer: Medicare Other

## 2016-11-20 DIAGNOSIS — Z95810 Presence of automatic (implantable) cardiac defibrillator: Secondary | ICD-10-CM

## 2016-11-20 DIAGNOSIS — I5042 Chronic combined systolic (congestive) and diastolic (congestive) heart failure: Secondary | ICD-10-CM

## 2016-11-20 NOTE — Progress Notes (Signed)
EPIC Encounter for ICM Monitoring  Patient Name: [name] is a 83 y.o. male Date: 11/20/2016 Primary Care Physican: Noralee Space, MD Primary Cardiologist: Hochrein Electrophysiologist: Faustino Congress Weight:    164 lbs      Heart Failure questions reviewed, pt asymptomatic   Thoracic impedance almost at baseline normal.  Recommendations: No changes. Reminded to limit dietary salt intake to 2000 mg/day and fluid intake to < 2 liters/day. Encouraged to call for fluid symptoms.  Follow-up plan: ICM clinic phone appointment on 12/21/2016.  Copy of ICM check sent to device physician.   3 month ICM trend: 11/20/2016   1 Year ICM trend:      [name], RN 11/20/2016 1:39 PM

## 2016-11-21 ENCOUNTER — Encounter (HOSPITAL_COMMUNITY)
Admission: RE | Admit: 2016-11-21 | Discharge: 2016-11-21 | Disposition: A | Discharge status: Home / Self Care | Payer: Medicare Other | Source: Ambulatory Visit | Attending: Cardiology | Admitting: Cardiology

## 2016-11-21 DIAGNOSIS — Z5189 Encounter for other specified aftercare: Secondary | ICD-10-CM | POA: Diagnosis not present

## 2016-11-21 DIAGNOSIS — I214 Non-ST elevation (NSTEMI) myocardial infarction: Secondary | ICD-10-CM

## 2016-11-21 NOTE — Progress Notes (Addendum)
[name] 83 y.o. male Nutrition Note Spoke with pt.  Nutrition Survey reviewed with pt. Pt is following Step 1 of the Therapeutic Lifestyle Changes diet. Pt has CHF and is aware of the need to watch salt, but not sodium. Limiting sodium and salt discussed. Age-appropriate nutrition recommendations discussed. Pt expressed understanding of the information reviewed. Pt aware of nutrition education classes offered and declines to attend nutrition classes or receive nutrition class handouts at this time. Lab Results  Component Value Date   HGBA1C 5.1 04/02/2016   Wt Readings from Last 3 Encounters:  11/13/16 169 lb 6.4 oz (76.8 kg)  11/06/16 165 lb 4 oz (75 kg)  09/07/16 163 lb 3.2 oz (74 kg)   Nutrition Diagnosis ? Food-and nutrition-related knowledge deficit related to lack of exposure to information as related to diagnosis of: ? CVD  Nutrition Intervention ? Benefits of adopting Therapeutic Lifestyle Changes discussed when Medficts reviewed. ? Pt to attend the Portion Distortion class - met 09/19/16 ? Continue client-centered nutrition education by RD, as part of interdisciplinary care.  Goal(s) ? Pt to describe the benefit of including fruits, vegetables, whole grains, and low-fat dairy products in a heart healthy meal plan.  Monitor and Evaluate progress toward nutrition goal with team.  Derek Mound, M.Ed, RD, LDN, CDE 11/21/2016 4:24 PM

## 2016-11-23 ENCOUNTER — Encounter (HOSPITAL_COMMUNITY)
Admission: RE | Admit: 2016-11-23 | Discharge: 2016-11-23 | Disposition: A | Discharge status: Home / Self Care | Payer: Medicare Other | Source: Ambulatory Visit | Attending: Cardiology | Admitting: Cardiology

## 2016-11-23 DIAGNOSIS — I214 Non-ST elevation (NSTEMI) myocardial infarction: Secondary | ICD-10-CM

## 2016-11-23 DIAGNOSIS — Z5189 Encounter for other specified aftercare: Secondary | ICD-10-CM | POA: Diagnosis not present

## 2016-11-23 NOTE — Progress Notes (Signed)
Cardiac Individual Treatment Plan  Patient Details  Name: [name] MRN: [id number] Date of Birth: 11-Nov-1930 Referring Provider:   Flowsheet Row CARDIAC REHAB PHASE II ORIENTATION from 08/30/2016 in [place]  Referring Provider  Marijo File MD      Initial Encounter Date:  [place] PHASE II ORIENTATION from 08/30/2016 in [facility]  Date  08/30/16  Referring Provider  Marijo File MD      Visit Diagnosis: 03/2016 NSTEMI (non-ST elevated myocardial infarction) [facility])  Patient's Home Medications on Admission:  Current Outpatient Prescriptions:  .  ADVAIR DISKUS 250-50 MCG/DOSE AEPB, INHALE 1 PUFF TWICE DAILY., Disp: 60 each, Rfl: 0 .  allopurinol (ZYLOPRIM) 100 MG tablet, TAKE 1 TABLET ONCE DAILY., Disp: 30 tablet, Rfl: 5 .  amiodarone (PACERONE) 200 MG tablet, Take 1 tablet (200 mg total) by mouth daily., Disp: 30 tablet, Rfl: 5 .  carvedilol (COREG) 3.125 MG tablet, Take 1 tablet (3.125 mg total) by mouth 2 (two) times daily with a meal., Disp: 60 tablet, Rfl: 11 .  cholecalciferol (VITAMIN D) 1000 units tablet, Take 1,000 Units by mouth daily., Disp: , Rfl:  .  clopidogrel (PLAVIX) 75 MG tablet, TAKE 1 TABLET ONCE DAILY., Disp: 30 tablet, Rfl: 0 .  guaiFENesin (MUCINEX) 600 MG 12 hr tablet, Take 600 mg by mouth 2 (two) times daily., Disp: , Rfl:  .  levothyroxine (SYNTHROID, LEVOTHROID) 25 MCG tablet, TAKE 1 TABLET IN THE MORNING ON AN EMPTY STOMACH., Disp: 30 tablet, Rfl: 0 .  magnesium oxide (MAG-OX) 400 (241.3 Mg) MG tablet, Take 0.5 tablets (200 mg total) by mouth daily., Disp: 30 tablet, Rfl: 0 .  Multiple Vitamin (MULTIVITAMIN) capsule, Take 1 capsule by mouth daily., Disp: , Rfl:  .  Multiple Vitamins-Minerals (PRESERVISION/LUTEIN) CAPS, Take 1 capsule by mouth daily. , Disp: , Rfl:  .  OXYBUTYNIN CHLORIDE PO, Take 1 tablet by mouth 2 (two) times daily. , Disp: , Rfl:  .  polyethylene  glycol powder (GLYCOLAX/MIRALAX) powder, DISSOLVE ONE CAPFUL IN 8 0Z. WATER TWICE DAILY., Disp: 527 g, Rfl: 0 .  pravastatin (PRAVACHOL) 40 MG tablet, Take 1 tablet (40 mg total) by mouth daily., Disp: 90 tablet, Rfl: 3 .  senna-docusate (SENOKOT-S) 8.6-50 MG tablet, Take 1 tablet by mouth 2 (two) times daily., Disp: 60 tablet, Rfl: 0 .  sertraline (ZOLOFT) 50 MG tablet, Take 1 tablet (50 mg total) by mouth at bedtime., Disp: 30 tablet, Rfl: 5 .  thiamine 100 MG tablet, Take 1 tablet (100 mg total) by mouth daily., Disp: 30 tablet, Rfl: 0  Past Medical History: Past Medical History:  Diagnosis Date  . AAA (abdominal aortic aneurysm) ([place])    a. 2002 s/p repair.  . Abnormal liver function tests   . AICD (automatic cardioverter/defibrillator) present   . Allergic rhinitis   . Atherosclerotic heart disease   . Back pain   . CAD (coronary artery disease)    a. 02/2002 H/o MI with stenting x 2; b. 04/2013 MV: EF 25%, large posterior lateral infarct w/o ischemia; c. 03/2016 VT Arrest/Cath: LM 60ost, LAD 40p/m ISR, LCX 100p/m, RCA nl-->Med Rx.  . Carotid arterial disease ([place])    a. 12/2010 s/p R CEA;  b. 05/2015 Carotid U/S: bilat <40% ICA stenosis.  . Chronic combined systolic and diastolic CHF (congestive heart failure) ([place])    a. 03/2016 Echo: Ef 35-40%, grade 1 DD.  Marland Kitchen Compression fracture   . COPD (  chronic obstructive pulmonary disease) ([place])   . Coronary artery disease   . Dermatitis   . Diverticulosis of colon   . DJD (degenerative joint disease)    and Gout  . Hemorrhoids   . History of colonic polyps   . History of gout   . History of pneumonia   . Hypercholesterolemia   . Hypertension   . Hypertensive heart disease   . Ischemic cardiomyopathy    a. EF prev <35%-->improved to normal by Echo 8/14:  Mild LVH, focal basal hypertrophy, EF 60-65%, normal wall motion, mild BAE, PASP 36; c. 03/2016 Echo: EF 35-40%, Gr1 DD, triv AI, mild MR, mod dil LA, mild-mod TR, PASP 27mHg.  .  Osteoporosis   . Peripheral vascular disease ([place]   . Presence of cardiac defibrillator    a. 03/2008 s/p MDT [id number] Maximo II DR, DC AICD; b. 03/2013: ICD shock for T wave oversensing;  c. 10/2015: collective decision not to replace ICD given improvement in LV fxn; d. VT Arrest-->Gen change to MDT ser # [id number].  . Skin cancer    shoulders and forehead  . Syncope   . T wave over sensing resulting in inappropriate shocks    a. 03/2013.  . Tobacco abuse   . [place]ricular tachycardia ([place] 04/01/2016    Tobacco Use: History  Smoking Status  . Former Smoker  . Types: Cigarettes, Cigars  . Quit date: 10/22/2012  Smokeless Tobacco  . Never Used    Comment: quit about 10 years ago    Labs: Recent Review Flowsheet Data    Labs for ITP Cardiac and Pulmonary Rehab Latest Ref Rng & Units 05/11/2015 03/29/2016 03/29/2016 03/30/2016 04/02/2016   Cholestrol 0 - 200 mg/dL 127 - - - -   LDLCALC 0 - 99 mg/dL 57 - - - -   LDLDIRECT mg/dL - - - - -   HDL >39.00 mg/dL 41.70 - - - -   Trlycerides 0.0 - 149.0 mg/dL 140.0 - - - -   Hemoglobin A1c 4.8 - 5.6 % - - - - 5.1   PHART 7.350 - 7.450 - - 7.283(L) 7.466(H) -   PCO2ART 35.0 - 45.0 mmHg - - 29.2(L) 25.6(L) -   HCO3 20.0 - 24.0 mEq/L - - 13.8(L) 18.2(L) -   TCO2 0 - 100 mmol/L - 16 15 19.0 -   ACIDBASEDEF 0.0 - 2.0 mmol/L - - 12.0(H) 4.9(H) -   O2SAT % - - 100.0 99.0 -      Capillary Blood Glucose: Lab Results  Component Value Date   GLUCAP 140 (H) 04/07/2016   GLUCAP 121 (H) 04/01/2016   GLUCAP 100 (H) 04/01/2016   GLUCAP 119 (H) 03/31/2016   GLUCAP 113 (H) 03/31/2016     Exercise Target Goals:    Exercise Program Goal: Individual exercise prescription set with THRR, safety & activity barriers. Participant demonstrates ability to understand and report RPE using BORG scale, to self-measure pulse accurately, and to acknowledge the importance of the exercise prescription.  Exercise Prescription Goal: Starting with aerobic activity 30 plus  minutes a day, 3 days per week for initial exercise prescription. Provide home exercise prescription and guidelines that participant acknowledges understanding prior to discharge.  Activity Barriers & Risk Stratification:     Activity Barriers & Cardiac Risk Stratification - 08/30/16 0911      Activity Barriers & Cardiac Risk Stratification   Activity Barriers Back Problems   Cardiac Risk Stratification High      6 Minute Walk:  6 Minute Walk    Row Name 08/30/16 1251         6 Minute Walk   Phase Initial     Distance 1021 feet     Walk Time 6 minutes     # of Rest Breaks 0     MPH 1.9     METS 1.6     RPE 11     VO2 Peak 5.7     Symptoms No     Resting HR 60 bpm     Resting BP 100/60     Max Ex. HR 60 bpm     Max Ex. BP 132/74     2 Minute Post BP 132/70        Initial Exercise Prescription:     Initial Exercise Prescription - 08/30/16 1200      Date of Initial Exercise RX and Referring Provider   Date 08/30/16   Referring Provider Marijo File MD     Recumbant Bike   Level --   Minutes --   METs --     NuStep   Level 1   Minutes 10   METs 1.5     Arm Ergometer   Level 1   Minutes 10   METs 2.7     Track   Laps 8   Minutes 10   METs 2.39     Prescription Details   Frequency (times per week) 3   Duration Progress to 45 minutes of aerobic exercise without signs/symptoms of physical distress     Intensity   THRR 40-80% of Max Heartrate 55-110   Ratings of Perceived Exertion 11-13   Perceived Dyspnea 0-4     Progression   Progression Continue to progress workloads to maintain intensity without signs/symptoms of physical distress.     Resistance Training   Training Prescription Yes   Weight 2   Reps 10-12      Perform Capillary Blood Glucose checks as needed.  Exercise Prescription Changes:     Exercise Prescription Changes    Row Name 09/26/16 1600 10/24/16 1400 11/19/16 1600         Exercise Review   Progression  -  Yes Yes       Response to Exercise   Blood Pressure (Admit) 98/64 114/68 108/62     Blood Pressure (Exercise) 104/60 122/80 134/68     Blood Pressure (Exit) 112/60 120/70 120/68     Heart Rate (Admit) 60 bpm 68 bpm 62 bpm     Heart Rate (Exercise) 91 bpm 85 bpm 101 bpm     Heart Rate (Exit) 60 bpm 60 bpm 59 bpm     Rating of Perceived Exertion (Exercise) 12 13 12      Duration Progress to 45 minutes of aerobic exercise without signs/symptoms of physical distress Progress to 45 minutes of aerobic exercise without signs/symptoms of physical distress Progress to 45 minutes of aerobic exercise without signs/symptoms of physical distress     Intensity THRR unchanged THRR unchanged THRR unchanged       Progression   Progression Continue to progress workloads to maintain intensity without signs/symptoms of physical distress. Continue to progress workloads to maintain intensity without signs/symptoms of physical distress. Continue to progress workloads to maintain intensity without signs/symptoms of physical distress.     Average METs 2.4 2.4 2.6       Resistance Training   Training Prescription Yes Yes Yes     Weight 2 2 3lb  Reps 10-12 10-12 10-12       NuStep   Level 1 3 5      Minutes 10 10 10      METs 2.1 1.3 2.7       Arm Ergometer   Level 1 1 2      Minutes 10 10 10      METs 2.76 2.72 2.72       Track   Laps 11 9 8      Minutes 10 10 10      METs 2.92 2.57 2.4       Home Exercise Plan   Plans to continue exercise at  - Home Home     Frequency  - Add 2 additional days to program exercise sessions. Add 2 additional days to program exercise sessions.        Exercise Comments:     Exercise Comments    Row Name 09/26/16 1643 10/24/16 1427 11/19/16 1618       Exercise Comments Reviewed METs and goals with pt.  Pt is off to a great start and is tolerating exercise well.   Reviewed METs and goals with pt.  Pt is doing well with exercise  Reviewed METs and goals with pt.  Pt is  doing well with exercise         Discharge Exercise Prescription (Final Exercise Prescription Changes):     Exercise Prescription Changes - 11/19/16 1600      Exercise Review   Progression Yes     Response to Exercise   Blood Pressure (Admit) 108/62   Blood Pressure (Exercise) 134/68   Blood Pressure (Exit) 120/68   Heart Rate (Admit) 62 bpm   Heart Rate (Exercise) 101 bpm   Heart Rate (Exit) 59 bpm   Rating of Perceived Exertion (Exercise) 12   Duration Progress to 45 minutes of aerobic exercise without signs/symptoms of physical distress   Intensity THRR unchanged     Progression   Progression Continue to progress workloads to maintain intensity without signs/symptoms of physical distress.   Average METs 2.6     Resistance Training   Training Prescription Yes   Weight 3lb   Reps 10-12     NuStep   Level 5   Minutes 10   METs 2.7     Arm Ergometer   Level 2   Minutes 10   METs 2.72     Track   Laps 8   Minutes 10   METs 2.4     Home Exercise Plan   Plans to continue exercise at Home   Frequency Add 2 additional days to program exercise sessions.      Nutrition:  Target Goals: Understanding of nutrition guidelines, daily intake of sodium <1578m, cholesterol <208m calories 30% from fat and 7% or less from saturated fats, daily to have 5 or more servings of fruits and vegetables.  Biometrics:     Pre Biometrics - 08/30/16 1553      Pre Biometrics   Waist Circumference 42.25 inches   Hip Circumference 39.5 inches   Waist to Hip Ratio 1.07 %   Triceps Skinfold 10 mm   % Body Fat 25.8 %   Grip Strength 30 kg   Flexibility 0 in   Single Leg Stand 0.5 seconds       Nutrition Therapy Plan and Nutrition Goals:     Nutrition Therapy & Goals - 09/17/16 0918      Nutrition Therapy   Diet Therapeutic Lifestyle Changes  Personal Nutrition Goals   Personal Goal #1 Maintain wt of 162 lb (73.8 kg) while in [place], educate and counsel regarding individualized specific dietary modifications aiming towards targeted core components such as weight, hypertension, lipid management, diabetes, heart failure and other comorbidities.   Expected Outcomes Short Term Goal: Understand basic principles of dietary content, such as calories, fat, sodium, cholesterol and nutrients.;Long Term Goal: Adherence to prescribed nutrition plan.      Nutrition Discharge: Nutrition Scores:     Nutrition Assessments - 09/17/16 1049      MEDFICTS Scores   Pre Score 68      Nutrition Goals Re-Evaluation:   Psychosocial: Target Goals: Acknowledge presence or absence of depression, maximize coping skills, provide positive support system. Participant is able to verbalize types and ability to use techniques and skills needed for reducing stress and depression.  Initial Review & Psychosocial Screening:     Initial Psych Review & Screening - 08/30/16 [place]? Yes   Comments Daughter present for orientation.  Brief assessment reveals no identifiable needs, no further intervention warranted     Barriers   Psychosocial barriers to participate in program There are no identifiable barriers or psychosocial needs.      Quality of Life Scores:     Quality of Life - 08/30/16 1425      Quality of Life Scores   Health/Function Pre 17.79 %   Socioeconomic Pre 22.64 %   Psych/Spiritual Pre 18.43 %   Family Pre 24.75 %   GLOBAL Pre 19.86 %      PHQ-9: Recent Review Flowsheet Data    Depression screen [facility] 2/9 07/09/2016 05/10/2016 06/08/2013   Decreased Interest 1 3 0   Down, Depressed, Hopeless 0 2 0   PHQ - 2 Score 1 5 0   Altered sleeping - 0 -   Tired, decreased energy - 3 -   Change in appetite - 3 -   Feeling bad or failure about yourself  - 3 -   Trouble concentrating - 0 -   Moving slowly or fidgety/restless - 0 -   Suicidal thoughts - 0 -    PHQ-9 Score - 14 -   Difficult doing work/chores - Somewhat difficult -      Psychosocial Evaluation and Intervention:     Psychosocial Evaluation - 08/30/16 1706      Discharge Psychosocial Assessment & Intervention   Discharge Continue support measures as needed      Psychosocial Re-Evaluation:     Psychosocial Re-Evaluation    Row Name 09/21/16 1610 10/25/16 1129 11/23/16 0853         Psychosocial Re-Evaluation   Interventions Encouraged to attend Cardiac Rehabilitation for the exercise Encouraged to attend Cardiac Rehabilitation for the exercise Encouraged to attend Cardiac Rehabilitation for the exercise     Continued Psychosocial Services Needed No No No        Vocational Rehabilitation: Provide vocational rehab assistance to qualifying candidates.   Vocational Rehab Evaluation & Intervention:     Vocational Rehab - 08/30/16 1706      Initial Vocational Rehab Evaluation & Intervention   Assessment shows need for Vocational Rehabilitation No  Pt does not plan to return to competitive employment.  Pt is retired.      Education: Education Goals: Education classes will be provided on a weekly basis, covering required topics. Participant  will state understanding/return demonstration of topics presented.  Learning Barriers/Preferences:     Learning Barriers/Preferences - 08/30/16 0910      Learning Barriers/Preferences   Learning Barriers Sight;Hearing   Learning Preferences Written Material;Verbal Instruction;Skilled Demonstration      Education Topics: Count Your Pulse:  -Group instruction provided by verbal instruction, demonstration, patient participation and written materials to support subject.  Instructors address importance of being able to find your pulse and how to count your pulse when at home without a heart monitor.  Patients get hands on experience counting their pulse with staff help and individually.   Heart Attack, Angina, and Risk Factor  Modification:  -Group instruction provided by verbal instruction, video, and written materials to support subject.  Instructors address signs and symptoms of angina and heart attacks.    Also discuss risk factors for heart disease and how to make changes to improve heart health risk factors. Flowsheet Row CARDIAC REHAB PHASE II EXERCISE from 11/21/2016 in [place]  Date  11/14/16  Instruction Review Code  2- meets goals/outcomes      Functional Fitness:  -Group instruction provided by verbal instruction, demonstration, patient participation, and written materials to support subject.  Instructors address safety measures for doing things around the house.  Discuss how to get up and down off the floor, how to pick things up properly, how to safely get out of a chair without assistance, and balance training.   Meditation and Mindfulness:  -Group instruction provided by verbal instruction, patient participation, and written materials to support subject.  Instructor addresses importance of mindfulness and meditation practice to help reduce stress and improve awareness.  Instructor also leads participants through a meditation exercise.  Flowsheet Row CARDIAC REHAB PHASE II EXERCISE from 11/21/2016 in [place]  Date  10/17/16  Instruction Review Code  2- meets goals/outcomes      Stretching for Flexibility and Mobility:  -Group instruction provided by verbal instruction, patient participation, and written materials to support subject.  Instructors lead participants through series of stretches that are designed to increase flexibility thus improving mobility.  These stretches are additional exercise for major muscle groups that are typically performed during regular warm up and cool down. Flowsheet Row CARDIAC REHAB PHASE II EXERCISE from 11/21/2016 in [place]  Date  11/16/16  Educator  [place]   Instruction Review Code  2- meets goals/outcomes      Hands Only CPR Anytime:  -Group instruction provided by verbal instruction, video, patient participation and written materials to support subject.  Instructors co-teach with AHA video for hands only CPR.  Participants get hands on experience with mannequins.   Nutrition I class: Heart Healthy Eating:  -Group instruction provided by PowerPoint slides, verbal discussion, and written materials to support subject matter. The instructor gives an explanation and review of the Therapeutic Lifestyle Changes diet recommendations, which includes a discussion on lipid goals, dietary fat, sodium, fiber, plant stanol/sterol esters, sugar, and the components of a well-balanced, healthy diet.   Nutrition II class: Lifestyle Skills:  -Group instruction provided by PowerPoint slides, verbal discussion, and written materials to support subject matter. The instructor gives an explanation and review of label reading, grocery shopping for heart health, heart healthy recipe modifications, and ways to make healthier choices when eating out.   Diabetes Question & Answer:  -Group instruction provided by PowerPoint slides, verbal discussion, and written materials to support subject matter. The  instructor gives an explanation and review of diabetes co-morbidities, pre- and post-prandial blood glucose goals, pre-exercise blood glucose goals, signs, symptoms, and treatment of hypoglycemia and hyperglycemia, and foot care basics.   Diabetes Blitz:  -Group instruction provided by PowerPoint slides, verbal discussion, and written materials to support subject matter. The instructor gives an explanation and review of the physiology behind type 1 and type 2 diabetes, diabetes medications and rational behind using different medications, pre- and post-prandial blood glucose recommendations and Hemoglobin A1c goals, diabetes diet, and exercise including blood glucose guidelines  for exercising safely.    Portion Distortion:  -Group instruction provided by PowerPoint slides, verbal discussion, written materials, and food models to support subject matter. The instructor gives an explanation of serving size versus portion size, changes in portions sizes over the last 20 years, and what consists of a serving from each food group. Flowsheet Row CARDIAC REHAB PHASE II EXERCISE from 11/21/2016 in [place]  Date  09/19/16 [Holiday Eating Survival Tips]  Educator  RD  Instruction Review Code  2- meets goals/outcomes      Stress Management:  -Group instruction provided by verbal instruction, video, and written materials to support subject matter.  Instructors review role of stress in heart disease and how to cope with stress positively.   Flowsheet Row CARDIAC REHAB PHASE II EXERCISE from 11/21/2016 in [place]  Date  11/09/16  Instruction Review Code  2- meets goals/outcomes      Exercising on Your Own:  -Group instruction provided by verbal instruction, power point, and written materials to support subject.  Instructors discuss benefits of exercise, components of exercise, frequency and intensity of exercise, and end points for exercise.  Also discuss use of nitroglycerin and activating EMS.  Review options of places to exercise outside of rehab.  Review guidelines for sex with heart disease. Flowsheet Row CARDIAC REHAB PHASE II EXERCISE from 11/21/2016 in [place]  Date  11/21/16  Instruction Review Code  2- meets goals/outcomes      Cardiac Drugs I:  -Group instruction provided by verbal instruction and written materials to support subject.  Instructor reviews cardiac drug classes: antiplatelets, anticoagulants, beta blockers, and statins.  Instructor discusses reasons, side effects, and lifestyle considerations for each drug class. Flowsheet Row CARDIAC REHAB PHASE II  EXERCISE from 11/21/2016 in [place]  Date  10/03/16  Instruction Review Code  2- meets goals/outcomes      Cardiac Drugs II:  -Group instruction provided by verbal instruction and written materials to support subject.  Instructor reviews cardiac drug classes: angiotensin converting enzyme inhibitors (ACE-I), angiotensin II receptor blockers (ARBs), nitrates, and calcium channel blockers.  Instructor discusses reasons, side effects, and lifestyle considerations for each drug class. Flowsheet Row CARDIAC REHAB PHASE II EXERCISE from 11/21/2016 in [place]  Date  10/31/16  Instruction Review Code  2- meets goals/outcomes      Anatomy and Physiology of the Circulatory System:  -Group instruction provided by verbal instruction, video, and written materials to support subject.  Reviews functional anatomy of heart, how it relates to various diagnoses, and what role the heart plays in the overall system. Flowsheet Row CARDIAC REHAB PHASE II EXERCISE from 11/21/2016 in [place]  Date  10/10/16  Instruction Review Code  2- meets goals/outcomes      Knowledge Questionnaire Score:     Knowledge  Questionnaire Score - 08/30/16 1251      Knowledge Questionnaire Score   Pre Score 20/24      Core Components/Risk Factors/Patient Goals at Admission:     Personal Goals and Risk Factors at Admission - 08/30/16 0912      Core Components/Risk Factors/Patient Goals on Admission   Increase Strength and Stamina Yes   Intervention Provide advice, education, support and counseling about physical activity/exercise needs.;Develop an individualized exercise prescription for aerobic and resistive training based on initial evaluation findings, risk stratification, comorbidities and participant's personal goals.   Expected Outcomes Achievement of increased cardiorespiratory fitness and enhanced flexibility,  muscular endurance and strength shown through measurements of functional capacity and personal statement of participant.   Hypertension Yes   Intervention Provide education on lifestyle modifcations including regular physical activity/exercise, weight management, moderate sodium restriction and increased consumption of fresh fruit, vegetables, and low fat dairy, alcohol moderation, and smoking cessation.;Monitor prescription use compliance.   Expected Outcomes Short Term: Continued assessment and intervention until BP is < 140/19m HG in hypertensive participants. < 130/819mHG in hypertensive participants with diabetes, heart failure or chronic kidney disease.;Long Term: Maintenance of blood pressure at goal levels.   Lipids Yes   Intervention Provide education and support for participant on nutrition & aerobic/resistive exercise along with prescribed medications to achieve LDL <703mHDL >4m28m Expected Outcomes Short Term: Participant states understanding of desired cholesterol values and is compliant with medications prescribed. Participant is following exercise prescription and nutrition guidelines.;Long Term: Cholesterol controlled with medications as prescribed, with individualized exercise RX and with personalized nutrition plan. Value goals: LDL < 70mg34mL > 40 mg.   Stress Yes   Intervention Offer individual and/or small group education and counseling on adjustment to heart disease, stress management and health-related lifestyle change. Teach and support self-help strategies.;Refer participants experiencing significant psychosocial distress to appropriate mental health specialists for further evaluation and treatment. When possible, include family members and significant others in education/counseling sessions.   Expected Outcomes Short Term: Participant demonstrates changes in health-related behavior, relaxation and other stress management skills, ability to obtain effective social support,  and compliance with psychotropic medications if prescribed.;Long Term: Emotional wellbeing is indicated by absence of clinically significant psychosocial distress or social isolation.   Personal Goal Other Yes   Personal Goal get driver's license, improve stamina and increase strength   Intervention Provide education classes (nutrition, functional fitness, medication management, ect) and an individualized exercise program   Expected Outcomes Imrove overall functional ability       Core Components/Risk Factors/Patient Goals Review:      Goals and Risk Factor Review    Row Name 09/26/16 1644 10/24/16 1438 11/19/16 1618         Core Components/Risk Factors/Patient Goals Review   Personal Goals Review Increase Strength and Stamina;Other Increase Strength and Stamina;Other Increase Strength and Stamina;Other     Review Pt states he feels about the same prior to CRPII but he is tolerating exercise well and says he feels good overall.   He states that he feels stronger and he has more energy Pt states his ADLs are becoming easier and his stamina gait have improved also.  Pt is now driving himself to CR     Expected Outcomes Continue with exercise routine and home exercise program and increase workload as tolerated in order to improve strength and stamina Continue with exercise routine and home exercise program and increase workload as tolerated in order to improve strength and  stamina Continue with exercise routine and home exercise program and increase workload as tolerated in order to improve strength and stamina        Core Components/Risk Factors/Patient Goals at Discharge (Final Review):      Goals and Risk Factor Review - 11/19/16 1618      Core Components/Risk Factors/Patient Goals Review   Personal Goals Review Increase Strength and Stamina;Other   Review Pt states his ADLs are becoming easier and his stamina gait have improved also.  Pt is now driving himself to CR   Expected  Outcomes Continue with exercise routine and home exercise program and increase workload as tolerated in order to improve strength and stamina      ITP Comments:     ITP Comments    Row Name 08/30/16 0912 10/19/16 1448         ITP Comments Fransico Him MD attended CPR education class.  outcomes and goals met.          Comments:Jonel is making expected progress toward personal goals after completing 26 sessions. Recommend continued exercise and life style modification education including  stress management and relaxation techniques to decrease cardiac risk profile. Emiel is enjoying participating and is driving to his sessions now.Barnet Pall, RN,BSN 11/23/2016 8:57 AM

## 2016-11-24 ENCOUNTER — Other Ambulatory Visit: Payer: Self-pay | Admitting: Pulmonary Disease

## 2016-11-26 ENCOUNTER — Encounter (HOSPITAL_COMMUNITY)
Admission: RE | Admit: 2016-11-26 | Discharge: 2016-11-26 | Disposition: A | Discharge status: Home / Self Care | Payer: Medicare Other | Source: Ambulatory Visit | Attending: Cardiology | Admitting: Cardiology

## 2016-11-26 DIAGNOSIS — I214 Non-ST elevation (NSTEMI) myocardial infarction: Secondary | ICD-10-CM

## 2016-11-26 DIAGNOSIS — Z5189 Encounter for other specified aftercare: Secondary | ICD-10-CM | POA: Diagnosis not present

## 2016-11-28 ENCOUNTER — Encounter (HOSPITAL_COMMUNITY)
Admission: RE | Admit: 2016-11-28 | Discharge: 2016-11-28 | Disposition: A | Discharge status: Home / Self Care | Payer: Medicare Other | Source: Ambulatory Visit | Attending: Cardiology | Admitting: Cardiology

## 2016-11-28 DIAGNOSIS — I214 Non-ST elevation (NSTEMI) myocardial infarction: Secondary | ICD-10-CM | POA: Diagnosis not present

## 2016-11-28 DIAGNOSIS — Z5189 Encounter for other specified aftercare: Secondary | ICD-10-CM | POA: Diagnosis not present

## 2016-11-29 ENCOUNTER — Telehealth: Payer: Self-pay | Admitting: Cardiology

## 2016-11-29 NOTE — Telephone Encounter (Signed)
Returned call - spoke to patient. He has an appt with Dr. Percival Spanish Dec 03, 2016 He states he saw Dr. Percival Spanish 1/23 and was unsure if he needed to come in again Reviewed MD note - he recommended a 54month follow up Dec 03, 2016 appt cancelled per patient request. He is aware he will get a notification when he needs to come back

## 2016-11-29 NOTE — Telephone Encounter (Signed)
Patient's daughter Marshell Levan) is calling and has a couple of questions about her father's appointment. Thanks.

## 2016-11-30 ENCOUNTER — Encounter (HOSPITAL_COMMUNITY)
Admission: RE | Admit: 2016-11-30 | Discharge: 2016-11-30 | Disposition: A | Discharge status: Home / Self Care | Payer: Medicare Other | Source: Ambulatory Visit | Attending: Cardiology | Admitting: Cardiology

## 2016-11-30 DIAGNOSIS — Z5189 Encounter for other specified aftercare: Secondary | ICD-10-CM | POA: Diagnosis not present

## 2016-11-30 DIAGNOSIS — I214 Non-ST elevation (NSTEMI) myocardial infarction: Secondary | ICD-10-CM | POA: Diagnosis not present

## 2016-12-03 ENCOUNTER — Encounter (HOSPITAL_COMMUNITY)
Admission: RE | Admit: 2016-12-03 | Discharge: 2016-12-03 | Disposition: A | Discharge status: Home / Self Care | Payer: Medicare Other | Source: Ambulatory Visit | Attending: Cardiology | Admitting: Cardiology

## 2016-12-03 ENCOUNTER — Ambulatory Visit: Payer: Medicare Other | Admitting: Cardiology

## 2016-12-03 DIAGNOSIS — I214 Non-ST elevation (NSTEMI) myocardial infarction: Secondary | ICD-10-CM | POA: Diagnosis not present

## 2016-12-03 DIAGNOSIS — Z5189 Encounter for other specified aftercare: Secondary | ICD-10-CM | POA: Diagnosis not present

## 2016-12-05 ENCOUNTER — Encounter (HOSPITAL_COMMUNITY)
Admission: RE | Admit: 2016-12-05 | Discharge: 2016-12-05 | Disposition: A | Discharge status: Home / Self Care | Payer: Medicare Other | Source: Ambulatory Visit | Attending: Cardiology | Admitting: Cardiology

## 2016-12-05 DIAGNOSIS — I214 Non-ST elevation (NSTEMI) myocardial infarction: Secondary | ICD-10-CM

## 2016-12-05 DIAGNOSIS — Z5189 Encounter for other specified aftercare: Secondary | ICD-10-CM | POA: Diagnosis not present

## 2016-12-07 ENCOUNTER — Encounter (HOSPITAL_COMMUNITY)
Admission: RE | Admit: 2016-12-07 | Discharge: 2016-12-07 | Disposition: A | Discharge status: Home / Self Care | Payer: Medicare Other | Source: Ambulatory Visit | Attending: Cardiology | Admitting: Cardiology

## 2016-12-07 DIAGNOSIS — I214 Non-ST elevation (NSTEMI) myocardial infarction: Secondary | ICD-10-CM | POA: Diagnosis not present

## 2016-12-07 DIAGNOSIS — Z5189 Encounter for other specified aftercare: Secondary | ICD-10-CM | POA: Diagnosis not present

## 2016-12-10 ENCOUNTER — Encounter (HOSPITAL_COMMUNITY)
Admission: RE | Admit: 2016-12-10 | Discharge: 2016-12-10 | Disposition: A | Discharge status: Home / Self Care | Payer: Medicare Other | Source: Ambulatory Visit | Attending: Cardiology | Admitting: Cardiology

## 2016-12-10 VITALS — Wt 174.4 lb

## 2016-12-10 DIAGNOSIS — I214 Non-ST elevation (NSTEMI) myocardial infarction: Secondary | ICD-10-CM | POA: Diagnosis not present

## 2016-12-10 DIAGNOSIS — Z5189 Encounter for other specified aftercare: Secondary | ICD-10-CM | POA: Diagnosis not present

## 2016-12-10 NOTE — Progress Notes (Signed)
Discharge Summary  Patient Details  Name: ARON INGE MRN: [id number] Date of Birth: [birth year] Referring Provider:   Flowsheet Row CARDIAC REHAB PHASE II ORIENTATION from 08/30/2016 in [place]  Referring Provider  Marijo File MD       Number of Visits: 36  Reason for Discharge:  Patient independent in their exercise.  Smoking History:  History  Smoking Status  . Former Smoker  . Types: Cigarettes, Cigars  . Quit date: 10/22/2012  Smokeless Tobacco  . Never Used    Comment: quit about 10 years ago    Diagnosis:  03/2016 NSTEMI (non-ST elevated myocardial infarction) ([place])  ADL UCSD:   Initial Exercise Prescription:     Initial Exercise Prescription - 08/30/16 1200      Date of Initial Exercise RX and Referring Provider   Date 08/30/16   Referring Provider Marijo File MD     Recumbant Bike   Level --   Minutes --   METs --     NuStep   Level 1   Minutes 10   METs 1.5     Arm Ergometer   Level 1   Minutes 10   METs 2.7     Track   Laps 8   Minutes 10   METs 2.39     Prescription Details   Frequency (times per week) 3   Duration Progress to 45 minutes of aerobic exercise without signs/symptoms of physical distress     Intensity   THRR 40-80% of Max Heartrate 55-110   Ratings of Perceived Exertion 11-13   Perceived Dyspnea 0-4     Progression   Progression Continue to progress workloads to maintain intensity without signs/symptoms of physical distress.     Resistance Training   Training Prescription Yes   Weight 2   Reps 10-12      Discharge Exercise Prescription (Final Exercise Prescription Changes):     Exercise Prescription Changes - 12/13/16 1400      Response to Exercise   Blood Pressure (Admit) 132/70   Blood Pressure (Exercise) 124/70   Blood Pressure (Exit) 126/70   Heart Rate (Admit) 60 bpm   Heart Rate (Exercise) 78 bpm   Heart Rate (Exit) 60 bpm   Rating of Perceived Exertion  (Exercise) 11   Duration Progress to 45 minutes of aerobic exercise without signs/symptoms of physical distress   Intensity THRR unchanged     Progression   Progression Continue to progress workloads to maintain intensity without signs/symptoms of physical distress.   Average METs 2.7     Resistance Training   Training Prescription Yes   Weight 3lb   Reps 10-12     NuStep   Level 5   Minutes 10   METs 2.7     Arm Ergometer   Level 2   Minutes 10   METs 2.66     Track   Laps 8   Minutes 10   METs 2.74     Home Exercise Plan   Plans to continue exercise at Home   Frequency Add 4 additional days to program exercise sessions.     Exercise Review   Progression Yes      Functional Capacity:     6 Minute Walk    Row Name 08/30/16 1251 11/26/16 1639       6 Minute Walk   Phase Initial Discharge    Distance 1021 feet 1241 feet    Distance % Change  -  21.5 %    Walk Time 6 minutes 6 minutes    # of Rest Breaks 0 0    MPH 1.9 2.4    METS 1.6 2    RPE 11 12    VO2 Peak 5.7 6.8    Symptoms No No    Resting HR 60 bpm 60 bpm    Resting BP 100/60 118/67    Max Ex. HR 60 bpm 60 bpm    Max Ex. BP 132/74 134/66    2 Minute Post BP 132/70 130/68       Psychological, QOL, Others - Outcomes: PHQ 2/9: Depression screen [facility] 2/9 12/10/2016 07/09/2016 05/10/2016 06/08/2013  Decreased Interest 0 1 3 0  Down, Depressed, Hopeless 0 0 2 0  PHQ - 2 Score 0 1 5 0  Altered sleeping - - 0 -  Tired, decreased energy - - 3 -  Change in appetite - - 3 -  Feeling bad or failure about yourself  - - 3 -  Trouble concentrating - - 0 -  Moving slowly or fidgety/restless - - 0 -  Suicidal thoughts - - 0 -  PHQ-9 Score - - 14 -  Difficult doing work/chores - - Somewhat difficult -  Some recent data might be hidden    Quality of Life:     Quality of Life - 12/13/16 1409      Quality of Life Scores   Health/Function Pre 17.79 %   Health/Function Post 15 %   Health/Function %  Change -15.68 %   Socioeconomic Pre 22.64 %   Socioeconomic Post 19.92 %   Socioeconomic % Change  -12.01 %   Psych/Spiritual Pre 18.43 %   Psych/Spiritual Post 16.25 %   Psych/Spiritual % Change -11.83 %   Family Pre 24.75 %   Family Post 13.5 %   Family % Change -45.45 %   GLOBAL Pre 19.86 %   GLOBAL Post 16 %   GLOBAL % Change -19.44 %      Personal Goals: Goals established at orientation with interventions provided to work toward goal.     Personal Goals and Risk Factors at Admission - 08/30/16 0912      Core Components/Risk Factors/Patient Goals on Admission   Increase Strength and Stamina Yes   Intervention Provide advice, education, support and counseling about physical activity/exercise needs.;Develop an individualized exercise prescription for aerobic and resistive training based on initial evaluation findings, risk stratification, comorbidities and participant's personal goals.   Expected Outcomes Achievement of increased cardiorespiratory fitness and enhanced flexibility, muscular endurance and strength shown through measurements of functional capacity and personal statement of participant.   Hypertension Yes   Intervention Provide education on lifestyle modifcations including regular physical activity/exercise, weight management, moderate sodium restriction and increased consumption of fresh fruit, vegetables, and low fat dairy, alcohol moderation, and smoking cessation.;Monitor prescription use compliance.   Expected Outcomes Short Term: Continued assessment and intervention until BP is < 140/47m HG in hypertensive participants. < 130/814mHG in hypertensive participants with diabetes, heart failure or chronic kidney disease.;Long Term: Maintenance of blood pressure at goal levels.   Lipids Yes   Intervention Provide education and support for participant on nutrition & aerobic/resistive exercise along with prescribed medications to achieve LDL <7011mHDL >12m60m Expected  Outcomes Short Term: Participant states understanding of desired cholesterol values and is compliant with medications prescribed. Participant is following exercise prescription and nutrition guidelines.;Long Term: Cholesterol controlled with medications as prescribed, with individualized exercise  RX and with personalized nutrition plan. Value goals: LDL < 41m, HDL > 40 mg.   Stress Yes   Intervention Offer individual and/or small group education and counseling on adjustment to heart disease, stress management and health-related lifestyle change. Teach and support self-help strategies.;Refer participants experiencing significant psychosocial distress to appropriate mental health specialists for further evaluation and treatment. When possible, include family members and significant others in education/counseling sessions.   Expected Outcomes Short Term: Participant demonstrates changes in health-related behavior, relaxation and other stress management skills, ability to obtain effective social support, and compliance with psychotropic medications if prescribed.;Long Term: Emotional wellbeing is indicated by absence of clinically significant psychosocial distress or social isolation.   Personal Goal Other Yes   Personal Goal get driver's license, improve stamina and increase strength   Intervention Provide education classes (nutrition, functional fitness, medication management, ect) and an individualized exercise program   Expected Outcomes Imrove overall functional ability        Personal Goals Discharge:     Goals and Risk Factor Review    Row Name 09/26/16 1644 10/24/16 1438 11/19/16 1618         Core Components/Risk Factors/Patient Goals Review   Personal Goals Review Increase Strength and Stamina;Other Increase Strength and Stamina;Other Increase Strength and Stamina;Other     Review Pt states he feels about the same prior to CRPII but he is tolerating exercise well and says he feels good  overall.   He states that he feels stronger and he has more energy Pt states his ADLs are becoming easier and his stamina gait have improved also.  Pt is now driving himself to CR     Expected Outcomes Continue with exercise routine and home exercise program and increase workload as tolerated in order to improve strength and stamina Continue with exercise routine and home exercise program and increase workload as tolerated in order to improve strength and stamina Continue with exercise routine and home exercise program and increase workload as tolerated in order to improve strength and stamina        Nutrition & Weight - Outcomes:     Pre Biometrics - 08/30/16 1553      Pre Biometrics   Waist Circumference 42.25 inches   Hip Circumference 39.5 inches   Waist to Hip Ratio 1.07 %   Triceps Skinfold 10 mm   % Body Fat 25.8 %   Grip Strength 30 kg   Flexibility 0 in   Single Leg Stand 0.5 seconds         Post Biometrics - 12/13/16 1407       Post  Biometrics   Weight 174 lb 6.1 oz (79.1 kg)   Waist Circumference 43 inches   Hip Circumference 41.5 inches   Waist to Hip Ratio 1.04 %   Triceps Skinfold 8 mm   % Body Fat 25.3 %   Grip Strength 34 kg   Flexibility 0 in   Single Leg Stand 0 seconds      Nutrition:     Nutrition Therapy & Goals - 09/17/16 0918      Nutrition Therapy   Diet Therapeutic Lifestyle Changes     Personal Nutrition Goals   Nutrition Goal Maintain wt of 162 lb (73.8 kg) while in Cardiac Rehab     Intervention Plan   Intervention Prescribe, educate and counsel regarding individualized specific dietary modifications aiming towards targeted core components such as weight, hypertension, lipid management, diabetes, heart failure and other comorbidities.  Expected Outcomes Short Term Goal: Understand basic principles of dietary content, such as calories, fat, sodium, cholesterol and nutrients.;Long Term Goal: Adherence to prescribed nutrition plan.       Nutrition Discharge:     Nutrition Assessments - 12/13/16 1553      MEDFICTS Scores   Pre Score 68   Post Score --  Nutrition survey returned with 5 sections incomplete; score was 38 with incomplete sections      Education Questionnaire Score:     Knowledge Questionnaire Score - 12/13/16 1405      Knowledge Questionnaire Score   Post Score 21/24      Goals reviewed with patient; copy given to patient. Luan graduated from cardiac rehab program today with completion of 36 exercise sessions in Phase II. Pt maintained good attendance and progressed nicely during his participation in rehab as evidenced by increased MET level.   Medication list reconciled. Repeat  PHQ score-0  .  Zayden has made significant lifestyle changes and should be commended for his success. Chirag feels he has achieved his goals during cardiac rehab.   Pt plans to continue exercise by walking his dog at home and doing home exercise. We are proud of Nathen's progress. Iodice says he feel's stronger since he has been participating in the program. Barnet Pall, RN,BSN 12/18/2016 8:58 AM

## 2016-12-12 ENCOUNTER — Encounter (HOSPITAL_COMMUNITY): Payer: Medicare Other

## 2016-12-13 ENCOUNTER — Telehealth: Payer: Self-pay | Admitting: Cardiology

## 2016-12-13 NOTE — Telephone Encounter (Signed)
Pt daughter called and stated that pt believes that he received therapy from his ICD last night (Wednesday 12-12-16). Informed daughter that we did not receive an automatic transmission from pt device. Requested that pt send a manual transmission w/ his home monitor. Pt stated that he feels fine. No shortness of breath, no dizziness, no chest pain. Pt will send transmission now and once it is received device tech RN will review and call back. Pt daughter agreed to this plan.

## 2016-12-13 NOTE — Telephone Encounter (Signed)
Called pt back and let him know that his remote transmission was received and that it was normal. Pt voiced understanding and appreciative of call back.

## 2016-12-14 ENCOUNTER — Encounter (HOSPITAL_COMMUNITY): Payer: Medicare Other

## 2016-12-15 ENCOUNTER — Other Ambulatory Visit: Payer: Self-pay | Admitting: Pulmonary Disease

## 2016-12-17 ENCOUNTER — Encounter (HOSPITAL_COMMUNITY): Payer: Medicare Other

## 2016-12-17 ENCOUNTER — Other Ambulatory Visit: Payer: Self-pay | Admitting: Pulmonary Disease

## 2016-12-19 ENCOUNTER — Encounter (HOSPITAL_COMMUNITY): Payer: Medicare Other

## 2016-12-21 ENCOUNTER — Encounter (HOSPITAL_COMMUNITY): Payer: Medicare Other

## 2016-12-21 ENCOUNTER — Other Ambulatory Visit: Payer: Self-pay | Admitting: Pulmonary Disease

## 2016-12-21 ENCOUNTER — Telehealth: Payer: Self-pay

## 2016-12-21 ENCOUNTER — Ambulatory Visit (INDEPENDENT_AMBULATORY_CARE_PROVIDER_SITE_OTHER): Payer: Medicare Other

## 2016-12-21 DIAGNOSIS — Z95810 Presence of automatic (implantable) cardiac defibrillator: Secondary | ICD-10-CM

## 2016-12-21 DIAGNOSIS — I5042 Chronic combined systolic (congestive) and diastolic (congestive) heart failure: Secondary | ICD-10-CM | POA: Diagnosis not present

## 2016-12-21 DIAGNOSIS — Z85828 Personal history of other malignant neoplasm of skin: Secondary | ICD-10-CM | POA: Diagnosis not present

## 2016-12-21 DIAGNOSIS — C44319 Basal cell carcinoma of skin of other parts of face: Secondary | ICD-10-CM | POA: Diagnosis not present

## 2016-12-21 DIAGNOSIS — Z08 Encounter for follow-up examination after completed treatment for malignant neoplasm: Secondary | ICD-10-CM | POA: Diagnosis not present

## 2016-12-21 NOTE — Progress Notes (Signed)
EPIC Encounter for ICM Monitoring  Patient Name: [name] is a 83 y.o. male Date: 12/21/2016 Primary Care Physican: Noralee Space, MD Primary Cardiologist: Hochrein Electrophysiologist: Faustino Congress Weight:164 lbs             Attempted call to patient and left message to return call.  Transmission reviewed.    Thoracic impedance abnormal suggesting fluid accumulation and has never reached baseline since October 2017.  Fluid index > threshold.  Not prescribed diuretic.   Labs: 11/13/2016 Creatinine 1.13, BUN 17, Potassium 4.6, Sodium 139 07/10/2016 Creatinine 1.06, BUN 18, Potassium 4.6, Sodium 133  07/05/2016 Creatinine 0.98, BUN 15, Potassium 4.9, Sodium 138  06/06/2016 Creatinine 0.84, BUN 11, Potassium 4.8, Sodium 137  05/16/2016 Creatinine 1.07, BUN 16, Potassium 5.1, Sodium 127   Recommendations: NONE - Unable to reach patient   Follow-up plan: ICM clinic phone appointment on 01/03/2017 to recheck fluid levels.  Copy of ICM check sent to primary cardiologist and device physician.   3 month ICM trend: 12/21/2016   1 Year ICM trend:      [name], RN 12/21/2016 7:34 AM

## 2016-12-21 NOTE — Telephone Encounter (Signed)
Remote ICM transmission received.  Attempted patient call and left message to return call.   

## 2016-12-24 ENCOUNTER — Encounter (HOSPITAL_COMMUNITY): Payer: Medicare Other

## 2016-12-26 ENCOUNTER — Encounter (HOSPITAL_COMMUNITY): Payer: Medicare Other

## 2016-12-28 ENCOUNTER — Encounter (HOSPITAL_COMMUNITY): Payer: Medicare Other

## 2016-12-31 ENCOUNTER — Encounter (HOSPITAL_COMMUNITY): Payer: Medicare Other

## 2017-01-01 ENCOUNTER — Telehealth: Payer: Self-pay | Admitting: Cardiology

## 2017-01-01 NOTE — Telephone Encounter (Signed)
Spoke with pts daughter and informed her that pts transmission was received and reviewed and there were no shocks or fast rhythms, informed pts daughter that it was not unheard of for a pt to awake in the middle of the night startled and feel as though they had received a shock. Pts daughter voiced understanding and appreciative of call back and stated that she would inform pt.

## 2017-01-01 NOTE — Telephone Encounter (Signed)
Pt daughter called and stated that pt informed her that he received ICD therapy shock around 3 AM this morning. Transmission received. Pt call pt daughter w/ results.

## 2017-01-03 ENCOUNTER — Telehealth: Payer: Self-pay

## 2017-01-03 ENCOUNTER — Ambulatory Visit (INDEPENDENT_AMBULATORY_CARE_PROVIDER_SITE_OTHER): Payer: Medicare Other

## 2017-01-03 DIAGNOSIS — M2041 Other hammer toe(s) (acquired), right foot: Secondary | ICD-10-CM | POA: Diagnosis not present

## 2017-01-03 DIAGNOSIS — I5042 Chronic combined systolic (congestive) and diastolic (congestive) heart failure: Secondary | ICD-10-CM

## 2017-01-03 DIAGNOSIS — Z95810 Presence of automatic (implantable) cardiac defibrillator: Secondary | ICD-10-CM

## 2017-01-03 DIAGNOSIS — M79674 Pain in right toe(s): Secondary | ICD-10-CM | POA: Diagnosis not present

## 2017-01-03 NOTE — Progress Notes (Signed)
EPIC Encounter for ICM Monitoring  Patient Name: [name] is a 83 y.o. male Date: 01/03/2017 Primary Care Physican: Noralee Space, MD Primary Cardiologist: Hochrein Electrophysiologist: Faustino Congress Weight:unknown      Attempted call to patient and unable to reach.  Left message to return call.  Transmission reviewed.    Thoracic impedance abnormal suggesting fluid accumulation and has never reached baseline since October 2017.  Fluid index > threshold.  Not prescribed diuretic.   Labs: 11/13/2016 Creatinine 1.13, BUN 17, Potassium 4.6, Sodium 139 07/10/2016 Creatinine 1.06, BUN 18, Potassium 4.6, Sodium 133  07/05/2016 Creatinine 0.98, BUN 15, Potassium 4.9, Sodium 138  06/06/2016 Creatinine 0.84, BUN 11, Potassium 4.8, Sodium 137  05/16/2016 Creatinine 1.07, BUN 16, Potassium 5.1, Sodium 127   Recommendations:  NONE - Unable to reach patient   Follow-up plan: ICM clinic phone appointment on 02/04/2017.  Defib office check with Dr Caryl Comes 01/23/2017  Copy of ICM check sent to primary cardiologist and device physician.   3 month ICM trend: 01/03/2017   1 Year ICM trend:      [name], RN 01/03/2017 3:25 PM

## 2017-01-03 NOTE — Telephone Encounter (Signed)
Remote ICM transmission received.  Attempted patient call and left message to return call.   

## 2017-01-12 ENCOUNTER — Inpatient Hospital Stay (HOSPITAL_COMMUNITY)
Admission: EM | Admit: 2017-01-12 | Discharge: 2017-01-14 | DRG: 291 | Disposition: A | Discharge status: Home / Self Care | Payer: Medicare Other | Attending: Internal Medicine | Admitting: Internal Medicine

## 2017-01-12 ENCOUNTER — Other Ambulatory Visit: Payer: Self-pay | Admitting: Pulmonary Disease

## 2017-01-12 ENCOUNTER — Encounter (HOSPITAL_COMMUNITY): Payer: Self-pay

## 2017-01-12 ENCOUNTER — Emergency Department (HOSPITAL_COMMUNITY): Payer: Medicare Other

## 2017-01-12 DIAGNOSIS — Z888 Allergy status to other drugs, medicaments and biological substances status: Secondary | ICD-10-CM | POA: Diagnosis not present

## 2017-01-12 DIAGNOSIS — I5043 Acute on chronic combined systolic (congestive) and diastolic (congestive) heart failure: Secondary | ICD-10-CM | POA: Diagnosis present

## 2017-01-12 DIAGNOSIS — Z87891 Personal history of nicotine dependence: Secondary | ICD-10-CM

## 2017-01-12 DIAGNOSIS — E785 Hyperlipidemia, unspecified: Secondary | ICD-10-CM | POA: Diagnosis not present

## 2017-01-12 DIAGNOSIS — Z7951 Long term (current) use of inhaled steroids: Secondary | ICD-10-CM | POA: Diagnosis not present

## 2017-01-12 DIAGNOSIS — M810 Age-related osteoporosis without current pathological fracture: Secondary | ICD-10-CM | POA: Diagnosis present

## 2017-01-12 DIAGNOSIS — I10 Essential (primary) hypertension: Secondary | ICD-10-CM | POA: Diagnosis not present

## 2017-01-12 DIAGNOSIS — M109 Gout, unspecified: Secondary | ICD-10-CM | POA: Diagnosis present

## 2017-01-12 DIAGNOSIS — I951 Orthostatic hypotension: Secondary | ICD-10-CM | POA: Diagnosis present

## 2017-01-12 DIAGNOSIS — I482 Chronic atrial fibrillation: Secondary | ICD-10-CM | POA: Diagnosis not present

## 2017-01-12 DIAGNOSIS — Z85828 Personal history of other malignant neoplasm of skin: Secondary | ICD-10-CM

## 2017-01-12 DIAGNOSIS — Z8674 Personal history of sudden cardiac arrest: Secondary | ICD-10-CM | POA: Diagnosis not present

## 2017-01-12 DIAGNOSIS — E7800 Pure hypercholesterolemia, unspecified: Secondary | ICD-10-CM | POA: Diagnosis present

## 2017-01-12 DIAGNOSIS — E871 Hypo-osmolality and hyponatremia: Secondary | ICD-10-CM | POA: Diagnosis present

## 2017-01-12 DIAGNOSIS — I452 Bifascicular block: Secondary | ICD-10-CM | POA: Diagnosis present

## 2017-01-12 DIAGNOSIS — J441 Chronic obstructive pulmonary disease with (acute) exacerbation: Secondary | ICD-10-CM | POA: Diagnosis present

## 2017-01-12 DIAGNOSIS — F1010 Alcohol abuse, uncomplicated: Secondary | ICD-10-CM | POA: Diagnosis not present

## 2017-01-12 DIAGNOSIS — Z7902 Long term (current) use of antithrombotics/antiplatelets: Secondary | ICD-10-CM

## 2017-01-12 DIAGNOSIS — I252 Old myocardial infarction: Secondary | ICD-10-CM | POA: Diagnosis not present

## 2017-01-12 DIAGNOSIS — J440 Chronic obstructive pulmonary disease with acute lower respiratory infection: Secondary | ICD-10-CM | POA: Diagnosis present

## 2017-01-12 DIAGNOSIS — I4891 Unspecified atrial fibrillation: Secondary | ICD-10-CM | POA: Diagnosis present

## 2017-01-12 DIAGNOSIS — Z9861 Coronary angioplasty status: Secondary | ICD-10-CM

## 2017-01-12 DIAGNOSIS — J449 Chronic obstructive pulmonary disease, unspecified: Secondary | ICD-10-CM | POA: Diagnosis present

## 2017-01-12 DIAGNOSIS — I509 Heart failure, unspecified: Secondary | ICD-10-CM | POA: Diagnosis not present

## 2017-01-12 DIAGNOSIS — I481 Persistent atrial fibrillation: Secondary | ICD-10-CM | POA: Diagnosis not present

## 2017-01-12 DIAGNOSIS — Z8249 Family history of ischemic heart disease and other diseases of the circulatory system: Secondary | ICD-10-CM

## 2017-01-12 DIAGNOSIS — E039 Hypothyroidism, unspecified: Secondary | ICD-10-CM | POA: Diagnosis present

## 2017-01-12 DIAGNOSIS — I5023 Acute on chronic systolic (congestive) heart failure: Secondary | ICD-10-CM | POA: Diagnosis not present

## 2017-01-12 DIAGNOSIS — R0602 Shortness of breath: Secondary | ICD-10-CM | POA: Diagnosis not present

## 2017-01-12 DIAGNOSIS — I739 Peripheral vascular disease, unspecified: Secondary | ICD-10-CM | POA: Diagnosis present

## 2017-01-12 DIAGNOSIS — Z87820 Personal history of traumatic brain injury: Secondary | ICD-10-CM

## 2017-01-12 DIAGNOSIS — J9621 Acute and chronic respiratory failure with hypoxia: Secondary | ICD-10-CM | POA: Diagnosis not present

## 2017-01-12 DIAGNOSIS — J209 Acute bronchitis, unspecified: Secondary | ICD-10-CM

## 2017-01-12 DIAGNOSIS — I2510 Atherosclerotic heart disease of native coronary artery without angina pectoris: Secondary | ICD-10-CM | POA: Diagnosis not present

## 2017-01-12 DIAGNOSIS — Z95810 Presence of automatic (implantable) cardiac defibrillator: Secondary | ICD-10-CM | POA: Diagnosis present

## 2017-01-12 DIAGNOSIS — I255 Ischemic cardiomyopathy: Secondary | ICD-10-CM | POA: Diagnosis present

## 2017-01-12 DIAGNOSIS — I110 Hypertensive heart disease with heart failure: Secondary | ICD-10-CM | POA: Diagnosis not present

## 2017-01-12 DIAGNOSIS — Z955 Presence of coronary angioplasty implant and graft: Secondary | ICD-10-CM

## 2017-01-12 DIAGNOSIS — Z79899 Other long term (current) drug therapy: Secondary | ICD-10-CM

## 2017-01-12 LAB — CBC WITH DIFFERENTIAL/PLATELET
BASOS ABS: 0 10*3/uL (ref 0.0–0.1)
BASOS PCT: 0 %
EOS ABS: 1.1 10*3/uL — AB (ref 0.0–0.7)
EOS PCT: 8 %
HCT: 45.5 % (ref 39.0–52.0)
Hemoglobin: 15.7 g/dL (ref 13.0–17.0)
LYMPHS PCT: 15 %
Lymphs Abs: 2 10*3/uL (ref 0.7–4.0)
MCH: 32.9 pg (ref 26.0–34.0)
MCHC: 34.5 g/dL (ref 30.0–36.0)
MCV: 95.4 fL (ref 78.0–100.0)
MONO ABS: 0.6 10*3/uL (ref 0.1–1.0)
Monocytes Relative: 4 %
Neutro Abs: 10 10*3/uL — ABNORMAL HIGH (ref 1.7–7.7)
Neutrophils Relative %: 73 %
PLATELETS: 128 10*3/uL — AB (ref 150–400)
RBC: 4.77 MIL/uL (ref 4.22–5.81)
RDW: 14 % (ref 11.5–15.5)
WBC: 13.7 10*3/uL — AB (ref 4.0–10.5)

## 2017-01-12 LAB — BASIC METABOLIC PANEL
ANION GAP: 9 (ref 5–15)
BUN: 20 mg/dL (ref 6–20)
CALCIUM: 9.2 mg/dL (ref 8.9–10.3)
CO2: 22 mmol/L (ref 22–32)
Chloride: 102 mmol/L (ref 101–111)
Creatinine, Ser: 1.05 mg/dL (ref 0.61–1.24)
Glucose, Bld: 146 mg/dL — ABNORMAL HIGH (ref 65–99)
Potassium: 4.3 mmol/L (ref 3.5–5.1)
Sodium: 133 mmol/L — ABNORMAL LOW (ref 135–145)

## 2017-01-12 LAB — I-STAT TROPONIN, ED: Troponin i, poc: 0 ng/mL (ref 0.00–0.08)

## 2017-01-12 LAB — MRSA PCR SCREENING: MRSA by PCR: NEGATIVE

## 2017-01-12 LAB — BRAIN NATRIURETIC PEPTIDE: B NATRIURETIC PEPTIDE 5: 1503 pg/mL — AB (ref 0.0–100.0)

## 2017-01-12 MED ORDER — CARVEDILOL 3.125 MG PO TABS
3.1250 mg | ORAL_TABLET | Freq: Two times a day (BID) | ORAL | Status: DC
  Administered 2017-01-13 – 2017-01-14 (×4): 3.125 mg via ORAL
  Filled 2017-01-12 (×4): qty 1

## 2017-01-12 MED ORDER — LEVALBUTEROL HCL 0.63 MG/3ML IN NEBU
0.6300 mg | INHALATION_SOLUTION | Freq: Three times a day (TID) | RESPIRATORY_TRACT | Status: DC
  Administered 2017-01-13 – 2017-01-14 (×5): 0.63 mg via RESPIRATORY_TRACT
  Filled 2017-01-12 (×5): qty 3

## 2017-01-12 MED ORDER — SODIUM CHLORIDE 0.9 % IV SOLN
250.0000 mL | INTRAVENOUS | Status: DC | PRN
  Administered 2017-01-12: 250 mL via INTRAVENOUS

## 2017-01-12 MED ORDER — OXYBUTYNIN CHLORIDE 5 MG PO TABS
5.0000 mg | ORAL_TABLET | Freq: Two times a day (BID) | ORAL | Status: DC
  Administered 2017-01-12 – 2017-01-14 (×4): 5 mg via ORAL
  Filled 2017-01-12 (×4): qty 1

## 2017-01-12 MED ORDER — ONDANSETRON HCL 4 MG/2ML IJ SOLN: 4.0000 mg | Freq: Four times a day (QID) | INTRAMUSCULAR | Status: DC | PRN

## 2017-01-12 MED ORDER — IPRATROPIUM BROMIDE 0.02 % IN SOLN
0.5000 mg | Freq: Four times a day (QID) | RESPIRATORY_TRACT | Status: DC
  Administered 2017-01-12: 0.5 mg via RESPIRATORY_TRACT
  Filled 2017-01-12: qty 2.5

## 2017-01-12 MED ORDER — LEVOFLOXACIN IN D5W 500 MG/100ML IV SOLN
500.0000 mg | INTRAVENOUS | Status: DC
  Administered 2017-01-12: 500 mg via INTRAVENOUS
  Filled 2017-01-12: qty 100

## 2017-01-12 MED ORDER — GUAIFENESIN ER 600 MG PO TB12
600.0000 mg | ORAL_TABLET | Freq: Two times a day (BID) | ORAL | Status: DC
  Administered 2017-01-12 – 2017-01-14 (×4): 600 mg via ORAL
  Filled 2017-01-12 (×4): qty 1

## 2017-01-12 MED ORDER — CLOPIDOGREL BISULFATE 75 MG PO TABS
75.0000 mg | ORAL_TABLET | Freq: Every day | ORAL | Status: DC
  Administered 2017-01-13 – 2017-01-14 (×2): 75 mg via ORAL
  Filled 2017-01-12 (×2): qty 1

## 2017-01-12 MED ORDER — BUDESONIDE 0.25 MG/2ML IN SUSP
0.2500 mg | Freq: Two times a day (BID) | RESPIRATORY_TRACT | Status: DC
  Administered 2017-01-12 – 2017-01-14 (×4): 0.25 mg via RESPIRATORY_TRACT
  Filled 2017-01-12 (×4): qty 2

## 2017-01-12 MED ORDER — LEVOTHYROXINE SODIUM 25 MCG PO TABS
25.0000 ug | ORAL_TABLET | Freq: Every day | ORAL | Status: DC
  Administered 2017-01-13 – 2017-01-14 (×2): 25 ug via ORAL
  Filled 2017-01-12 (×2): qty 1

## 2017-01-12 MED ORDER — MAGNESIUM OXIDE 400 (241.3 MG) MG PO TABS
200.0000 mg | ORAL_TABLET | Freq: Every day | ORAL | Status: DC
  Administered 2017-01-13 – 2017-01-14 (×2): 200 mg via ORAL
  Filled 2017-01-12 (×2): qty 1

## 2017-01-12 MED ORDER — ENOXAPARIN SODIUM 40 MG/0.4ML ~~LOC~~ SOLN
40.0000 mg | SUBCUTANEOUS | Status: DC
  Administered 2017-01-12 – 2017-01-13 (×2): 40 mg via SUBCUTANEOUS
  Filled 2017-01-12 (×2): qty 0.4

## 2017-01-12 MED ORDER — ACETAMINOPHEN 325 MG PO TABS: 650.0000 mg | ORAL_TABLET | ORAL | Status: DC | PRN

## 2017-01-12 MED ORDER — LEVALBUTEROL HCL 0.63 MG/3ML IN NEBU
0.6300 mg | INHALATION_SOLUTION | Freq: Four times a day (QID) | RESPIRATORY_TRACT | Status: DC
  Administered 2017-01-12: 0.63 mg via RESPIRATORY_TRACT
  Filled 2017-01-12: qty 3

## 2017-01-12 MED ORDER — PROSIGHT PO TABS
1.0000 | ORAL_TABLET | Freq: Every day | ORAL | Status: DC
  Administered 2017-01-13 – 2017-01-14 (×2): 1 via ORAL
  Filled 2017-01-12 (×2): qty 1

## 2017-01-12 MED ORDER — ALBUTEROL SULFATE (2.5 MG/3ML) 0.083% IN NEBU
INHALATION_SOLUTION | RESPIRATORY_TRACT | Status: AC
  Administered 2017-01-12: 2.5 mg via RESPIRATORY_TRACT
  Filled 2017-01-12: qty 3

## 2017-01-12 MED ORDER — METHYLPREDNISOLONE SODIUM SUCC 125 MG IJ SOLR
60.0000 mg | Freq: Two times a day (BID) | INTRAMUSCULAR | Status: DC
  Administered 2017-01-13: 60 mg via INTRAVENOUS
  Filled 2017-01-12: qty 2

## 2017-01-12 MED ORDER — ALLOPURINOL 100 MG PO TABS
100.0000 mg | ORAL_TABLET | Freq: Every day | ORAL | Status: DC
  Administered 2017-01-13 – 2017-01-14 (×2): 100 mg via ORAL
  Filled 2017-01-12 (×2): qty 1

## 2017-01-12 MED ORDER — NITROGLYCERIN IN D5W 200-5 MCG/ML-% IV SOLN
0.0000 ug/min | Freq: Once | INTRAVENOUS | Status: AC
  Administered 2017-01-12: 5 ug/min via INTRAVENOUS
  Filled 2017-01-12: qty 250

## 2017-01-12 MED ORDER — LORAZEPAM 2 MG/ML IJ SOLN: 0.5000 mg | Freq: Four times a day (QID) | INTRAMUSCULAR | Status: DC | PRN

## 2017-01-12 MED ORDER — SODIUM CHLORIDE 0.9% FLUSH
3.0000 mL | Freq: Two times a day (BID) | INTRAVENOUS | Status: DC
  Administered 2017-01-13 – 2017-01-14 (×2): 3 mL via INTRAVENOUS

## 2017-01-12 MED ORDER — IPRATROPIUM-ALBUTEROL 0.5-2.5 (3) MG/3ML IN SOLN
3.0000 mL | Freq: Once | RESPIRATORY_TRACT | Status: AC
  Administered 2017-01-12: 3 mL via RESPIRATORY_TRACT
  Filled 2017-01-12: qty 3

## 2017-01-12 MED ORDER — IPRATROPIUM BROMIDE 0.02 % IN SOLN
0.5000 mg | Freq: Three times a day (TID) | RESPIRATORY_TRACT | Status: DC
  Administered 2017-01-13 – 2017-01-14 (×5): 0.5 mg via RESPIRATORY_TRACT
  Filled 2017-01-12 (×5): qty 2.5

## 2017-01-12 MED ORDER — SODIUM CHLORIDE 0.9% FLUSH: 3.0000 mL | INTRAVENOUS | Status: DC | PRN

## 2017-01-12 MED ORDER — ALBUTEROL SULFATE (2.5 MG/3ML) 0.083% IN NEBU
2.5000 mg | INHALATION_SOLUTION | Freq: Once | RESPIRATORY_TRACT | Status: AC
  Administered 2017-01-12: 2.5 mg via RESPIRATORY_TRACT

## 2017-01-12 MED ORDER — LORAZEPAM 0.5 MG PO TABS
0.5000 mg | ORAL_TABLET | Freq: Four times a day (QID) | ORAL | Status: DC | PRN
Start: 2017-01-12 — End: 2017-01-13

## 2017-01-12 MED ORDER — FUROSEMIDE 10 MG/ML IJ SOLN
40.0000 mg | Freq: Two times a day (BID) | INTRAMUSCULAR | Status: DC
  Administered 2017-01-12 – 2017-01-13 (×2): 40 mg via INTRAVENOUS
  Filled 2017-01-12 (×2): qty 4

## 2017-01-12 MED ORDER — PRAVASTATIN SODIUM 40 MG PO TABS
40.0000 mg | ORAL_TABLET | Freq: Every day | ORAL | Status: DC
  Administered 2017-01-13 – 2017-01-14 (×2): 40 mg via ORAL
  Filled 2017-01-12 (×2): qty 1

## 2017-01-12 MED ORDER — AMIODARONE HCL 200 MG PO TABS
200.0000 mg | ORAL_TABLET | Freq: Every day | ORAL | Status: DC
  Administered 2017-01-13 – 2017-01-14 (×2): 200 mg via ORAL
  Filled 2017-01-12 (×2): qty 1

## 2017-01-12 MED ORDER — VITAMIN B-1 100 MG PO TABS
100.0000 mg | ORAL_TABLET | Freq: Every day | ORAL | Status: DC
  Administered 2017-01-13 – 2017-01-14 (×2): 100 mg via ORAL
  Filled 2017-01-12 (×2): qty 1

## 2017-01-12 MED ORDER — METHYLPREDNISOLONE SODIUM SUCC 125 MG IJ SOLR
125.0000 mg | Freq: Once | INTRAMUSCULAR | Status: AC
  Administered 2017-01-12: 125 mg via INTRAVENOUS
  Filled 2017-01-12: qty 2

## 2017-01-12 MED ORDER — PRESERVISION/LUTEIN PO CAPS: 1.0000 | ORAL_CAPSULE | Freq: Every day | ORAL | Status: DC

## 2017-01-12 MED ORDER — SERTRALINE HCL 50 MG PO TABS
50.0000 mg | ORAL_TABLET | Freq: Every day | ORAL | Status: DC
  Administered 2017-01-12 – 2017-01-13 (×2): 50 mg via ORAL
  Filled 2017-01-12 (×2): qty 1

## 2017-01-12 MED ORDER — FOLIC ACID 1 MG PO TABS
1.0000 mg | ORAL_TABLET | Freq: Every day | ORAL | Status: DC
  Administered 2017-01-12 – 2017-01-14 (×3): 1 mg via ORAL
  Filled 2017-01-12 (×2): qty 1

## 2017-01-12 NOTE — Progress Notes (Signed)
Pt received from ED.  Upon arrival to patient's room, pt found with eyes closed, resting, no increased wob or respiratory distress noted, HR60, RR17, Spo2 95% on 2lnc.  Pt given scheduled nebulizer treatment which he tolerated well.  Bipap not indicated at this time.

## 2017-01-12 NOTE — H&P (Addendum)
History and Physical    [name] GEX:[id number] DOB: Jul 12, 1931 DOA: 01/12/2017  I have briefly reviewed the patient's prior medical records in [place]  PCP: Noralee Space, MD  Patient coming from: Home  Chief Complaint: Shortness of breath  HPI: [name] is a 83 y.o. male with medical history significant of coronary artery disease with multiple prior MIs and stenting, chronic combined CHF with an EF of as low as 25% in the past / ICDin place, improved to 35-40% last year, prior history of alcohol abuse, COPD, tobacco abuse in the past, hypertension, hyperlipidemia, presents to the hospital with chief complaint of shortness of breath.  Patient has been having progressive shortness of breath over the last week.  He is barely able to complete a sentence in the emergency room and part of the story is per his daughter who is at bedside.  She saw him 2 days ago and he was feeling okay at that time, however they had breakfast this morning and she has noticed that he was significantly short of breath with minimal activity.  Patient denies any weight gain however he does not weigh himself regularly.  It is noted in the cardiology notes that he never had any issues with fluid overload so he was not on diuretics at home.  Patient denies any chest pain.  He has no abdominal pain, has no nausea or vomiting.  He denies any diarrhea.  He denies any lightheadedness or dizziness.  He tells me he is using a pillow to sleep at night, and denies waking up in the middle of the night short of breath.  He also reports an increased cough over the last 3-4 days as well as green sputum production.  After interviewing the patient, daughter pulled me outside the patient's room, and expressed her concerns that she thinks the patient may be starting to drink again.  He has a long history of alcohol abuse, and quit drinking alcohol regularly last year.  She is concerned however that he has been going out with 1 of  his friends a few times a week and she is concerned that he is having alcohol in his outings.  She does not think he has any alcohol in his home and is fairly convinced that he is abstaining from alcohol while he is at home  ED Course: In the emergency room, patient is in significant respiratory distress with hypoxia and saturations in the low 80s on room air and the BiPAP has been ordered but has not been placed yet, he was given steroids as well as a nebulizer treatment and IV Lasix.  He was placed on nitroglycerin infusion due to elevated blood pressure into the 190s.  His kidney function is stable, he is mildly hyponatremic with a sodium of 133.  CBC is pending.  Chest x-ray shows findings consistent with pulmonary edema.  EKG showed paced rhythm.  Troponin was negative.  Review of Systems: As per HPI otherwise 10 point review of systems negative.   Past Medical History:  Diagnosis Date  . AAA (abdominal aortic aneurysm) ([place])    a. 2002 s/p repair.  . Abnormal liver function tests   . AICD (automatic cardioverter/defibrillator) present   . Allergic rhinitis   . Atherosclerotic heart disease   . Back pain   . CAD (coronary artery disease)    a. 02/2002 H/o MI with stenting x 2; b. 04/2013 MV: EF 25%, large posterior lateral infarct w/o ischemia; c. 03/2016  VT Arrest/Cath: LM 60ost, LAD 40p/m ISR, LCX 100p/m, RCA nl-->Med Rx.  . Carotid arterial disease ([place])    a. 12/2010 s/p R CEA;  b. 05/2015 Carotid U/S: bilat <40% ICA stenosis.  . Chronic combined systolic and diastolic CHF (congestive heart failure) ([place])    a. 03/2016 Echo: Ef 35-40%, grade 1 DD.  Marland Kitchen Compression fracture   . COPD (chronic obstructive pulmonary disease) ([place])   . Coronary artery disease   . Dermatitis   . Diverticulosis of colon   . DJD (degenerative joint disease)    and Gout  . Hemorrhoids   . History of colonic polyps   . History of gout   . History of pneumonia   . Hypercholesterolemia   . Hypertension   .  Hypertensive heart disease   . Ischemic cardiomyopathy    a. EF prev <35%-->improved to normal by Echo 8/14:  Mild LVH, focal basal hypertrophy, EF 60-65%, normal wall motion, mild BAE, PASP 36; c. 03/2016 Echo: EF 35-40%, Gr1 DD, triv AI, mild MR, mod dil LA, mild-mod TR, PASP 1mmHg.  . Osteoporosis   . Peripheral vascular disease ([place])   . Presence of cardiac defibrillator    a. 03/2008 s/p MDT [id number] Maximo II DR, DC AICD; b. 03/2013: ICD shock for T wave oversensing;  c. 10/2015: collective decision not to replace ICD given improvement in LV fxn; d. VT Arrest-->Gen change to MDT ser # [id number].  . Skin cancer    shoulders and forehead  . Syncope   . T wave over sensing resulting in inappropriate shocks    a. 03/2013.  . Tobacco abuse   . Ventricular tachycardia ([place]) 04/01/2016    Past Surgical History:  Procedure Laterality Date  . ABDOMINAL AORTIC ANEURYSM REPAIR  2002   by Dr. Kellie Simmering  . aicd placed  03/2008   Dr. Caryl Comes  . cad stent  02/2002   Dr. Percival Spanish  . CARDIAC CATHETERIZATION     X 2 stents  . CARDIAC CATHETERIZATION N/A 04/03/2016   Procedure: Left Heart Cath and Coronary Angiography;  Surgeon: Belva Crome, MD;  Location: [facility];  Service: Cardiovascular;  Laterality: N/A;  . CARDIAC DEFIBRILLATOR PLACEMENT  2009  . CARDIAC DEFIBRILLATOR PLACEMENT    . CAROTID ENDARTERECTOMY Right January 02, 2011  . EAR CYST EXCISION Left 12/13/2015   Procedure: Excision left ear lesion ;  Surgeon: Melissa Montane, MD;  Location: [facility];  Service: ENT;  Laterality: Left;  . EP IMPLANTABLE DEVICE N/A 04/06/2016   Procedure:  ICD Generator Changeout;  Surgeon: Deboraha Sprang, MD;  Location: [facility];  Service: Cardiovascular;  Laterality: N/A;  . EXCISION OF LESION LEFT EAR Left 12/13/2015  . I&D EXTREMITY Left 04/23/2016   Procedure: IRRIGATION AND DEBRIDEMENT HAND;  Surgeon: Leandrew Koyanagi, MD;  Location: [facility];  Service: Orthopedics;  Laterality: Left;  . OPEN REDUCTION  INTERNAL FIXATION (ORIF) METACARPAL Left 04/04/2016   Procedure: OPEN REDUCTION INTERNAL FIXATION (ORIF) LEFT 2ND, 3RD, 4TH METACARPAL FRACTURE;  Surgeon: Leandrew Koyanagi, MD;  Location: [facility];  Service: Orthopedics;  Laterality: Left;  OPEN REDUCTION INTERNAL FIXATION (ORIF) LEFT 2ND, 3RD, 4TH METACARPAL FRACTURE  . OPEN REDUCTION INTERNAL FIXATION (ORIF) METACARPAL Left 04/23/2016   Procedure: REVISION OPEN REDUCTION INTERNAL FIXATION (ORIF) 2ND METACARPAL;  Surgeon: Leandrew Koyanagi, MD;  Location: [facility];  Service: Orthopedics;  Laterality: Left;  . right corotid enderectomy  12/2010   Dr. Scot Dock  . SKIN  SPLIT GRAFT Left 12/13/2015   Procedure: with possible skin graft;  Surgeon: Melissa Montane, MD;  Location: [facility];  Service: ENT;  Laterality: Left;  . TONSILLECTOMY       reports that he quit smoking about 4 years ago. His smoking use included Cigarettes and Cigars. He has never used smokeless tobacco. He reports that he drinks alcohol. He reports that he does not use drugs.  Allergies  Allergen Reactions  . Lisinopril     BP dropped too low per daughter    Family History  Problem Relation Age of Onset  . Hypertension Father   . Heart disease Father     Heart Disease before age 40  . Hypertension Mother   . Heart disease Mother     Heart Disease before age 62  . Cancer Mother     Prior to Admission medications   Medication Sig Start Date End Date Taking? Authorizing Provider  ADVAIR DISKUS 250-50 MCG/DOSE AEPB INHALE 1 PUFF TWICE DAILY. 12/18/16  Yes Noralee Space, MD  allopurinol (ZYLOPRIM) 100 MG tablet TAKE 1 TABLET ONCE DAILY. 09/25/16  Yes Noralee Space, MD  amiodarone (PACERONE) 200 MG tablet TAKE 1 TABLET ONCE DAILY. 12/21/16  Yes Noralee Space, MD  carvedilol (COREG) 3.125 MG tablet Take 1 tablet (3.125 mg total) by mouth 2 (two) times daily with a meal. 05/16/16  Yes Rogelia Mire, NP  cholecalciferol (VITAMIN D) 1000 units tablet Take 1,000 Units by mouth daily.   Yes Historical  Provider, MD  clopidogrel (PLAVIX) 75 MG tablet TAKE 1 TABLET ONCE DAILY. 12/21/16  Yes Noralee Space, MD  guaiFENesin (MUCINEX) 600 MG 12 hr tablet Take 600 mg by mouth 2 (two) times daily.   Yes Historical Provider, MD  levothyroxine (SYNTHROID, LEVOTHROID) 25 MCG tablet TAKE 1 TABLET IN THE MORNING ON AN EMPTY STOMACH. 12/17/16  Yes Noralee Space, MD  magnesium oxide (MAG-OX) 400 (241.3 Mg) MG tablet Take 0.5 tablets (200 mg total) by mouth daily. 04/27/16  Yes Ivan Anchors Love, PA-C  Multiple Vitamin (MULTIVITAMIN) capsule Take 1 capsule by mouth daily.   Yes Historical Provider, MD  Multiple Vitamins-Minerals (PRESERVISION/LUTEIN) CAPS Take 1 capsule by mouth daily.    Yes Historical Provider, MD  oxybutynin (DITROPAN) 5 MG tablet TAKE 1 TABLET TWICE DAILY. 11/26/16  Yes Noralee Space, MD  polyethylene glycol powder (GLYCOLAX/MIRALAX) powder DISSOLVE ONE CAPFUL IN 8 0Z. WATER TWICE DAILY. 12/21/16  Yes Noralee Space, MD  pravastatin (PRAVACHOL) 40 MG tablet Take 1 tablet (40 mg total) by mouth daily. 05/04/16  Yes Noralee Space, MD  sertraline (ZOLOFT) 50 MG tablet Take 1 tablet (50 mg total) by mouth at bedtime. 07/09/16  Yes Noralee Space, MD  thiamine 100 MG tablet Take 1 tablet (100 mg total) by mouth daily. 04/26/16  Yes Bary Leriche, PA-C    Physical Exam: Vitals:   01/12/17 1444 01/12/17 1507 01/12/17 1534  BP: (!) 191/90  (!) 180/94  Pulse: 65  60  Resp: 20  18  Temp: 97.4 F (36.3 C)    TempSrc: Oral    SpO2: (!) 83% 93% 94%      Constitutional: Appears in distress struggling to breathe Vitals:   01/12/17 1444 01/12/17 1507 01/12/17 1534  BP: (!) 191/90  (!) 180/94  Pulse: 65  60  Resp: 20  18  Temp: 97.4 F (36.3 C)    TempSrc: Oral    SpO2: (!) 83% 93%  94%   Eyes: PERRL, lids and conjunctivae pale ENMT: Mucous membranes are moist. Neck: normal, supple, no masses, Respiratory: Bibasilar crackles, bilateral wheezing, increased respiratory effort, overall very coarse breath  sounds Cardiovascular: Regular rate and rhythm, no murmurs / rubs / gallops. 2+ pitting lower extremity edema. 2+ pedal pulses.  Abdomen: no tenderness, no masses palpated. Bowel sounds positive.  Musculoskeletal: no clubbing / cyanosis. Decreased muscle tone.  Skin: no rashes, lesions, ulcers. No induration Neurologic: non focal. Psychiatric: Normal judgment and insight. Alert and oriented x 3. Normal mood.   Labs on Admission: I have personally reviewed following labs and imaging studies  CBC: No results for input(s): WBC, NEUTROABS, HGB, HCT, MCV, PLT in the last 168 hours. Basic Metabolic Panel:  Recent Labs Lab 01/12/17 1501  NA 133*  K 4.3  CL 102  CO2 22  GLUCOSE 146*  BUN 20  CREATININE 1.05  CALCIUM 9.2   GFR: CrCl cannot be calculated (Unknown ideal weight.). Liver Function Tests: No results for input(s): AST, ALT, ALKPHOS, BILITOT, PROT, ALBUMIN in the last 168 hours. No results for input(s): LIPASE, AMYLASE in the last 168 hours. No results for input(s): AMMONIA in the last 168 hours. Coagulation Profile: No results for input(s): INR, PROTIME in the last 168 hours. Cardiac Enzymes: No results for input(s): CKTOTAL, CKMB, CKMBINDEX, TROPONINI in the last 168 hours. BNP (last 3 results) No results for input(s): PROBNP in the last 8760 hours. HbA1C: No results for input(s): HGBA1C in the last 72 hours. CBG: No results for input(s): GLUCAP in the last 168 hours. Lipid Profile: No results for input(s): CHOL, HDL, LDLCALC, TRIG, CHOLHDL, LDLDIRECT in the last 72 hours. Thyroid Function Tests: No results for input(s): TSH, T4TOTAL, FREET4, T3FREE, THYROIDAB in the last 72 hours. Anemia Panel: No results for input(s): VITAMINB12, FOLATE, FERRITIN, TIBC, IRON, RETICCTPCT in the last 72 hours. Urine analysis:    Component Value Date/Time   COLORURINE YELLOW 04/14/2016 1028   APPEARANCEUR CLEAR 04/14/2016 1028   LABSPEC 1.013 04/14/2016 1028   PHURINE 6.0  04/14/2016 1028   GLUCOSEU NEGATIVE 04/14/2016 1028   HGBUR NEGATIVE 04/14/2016 1028   BILIRUBINUR NEGATIVE 04/14/2016 1028   KETONESUR NEGATIVE 04/14/2016 1028   PROTEINUR NEGATIVE 04/14/2016 1028   UROBILINOGEN 2.0 (H) 10/28/2012 0820   NITRITE NEGATIVE 04/14/2016 1028   LEUKOCYTESUR NEGATIVE 04/14/2016 1028     Radiological Exams on Admission: Dg Chest 2 View  Result Date: 01/12/2017 CLINICAL DATA:  Shortness of breath starting last night EXAM: CHEST  2 VIEW COMPARISON:  05/04/2016 FINDINGS: Cardiomegaly is noted. Dual lead cardiac pacemaker is unchanged in position. There is central vascular congestion and mild perihilar and infrahilar interstitial prominence suspicious for pulmonary edema. Small bilateral pleural effusion with bilateral basilar atelectasis or infiltrate. IMPRESSION: There is central vascular congestion and mild perihilar and infrahilar interstitial prominence suspicious for pulmonary edema. Small bilateral pleural effusion with bilateral basilar atelectasis or infiltrate. Electronically Signed   By: Lahoma Crocker M.D.   On: 01/12/2017 15:07    EKG: Independently reviewed.  Paced rhythm  Assessment/Plan Active Problems:   CHF exacerbation (HCC)   Acute on chronic combined CHF exacerbation -Suspect due to dietary noncompliance to a low-sodium diet, versus recent alcohol intake of unknown quantity -Admit on heart failure pathway, he is Lasix nave and will place patient on 40 mg IV twice daily, strict I's and O's, daily weights -Update a 2D echo -Has ICD in place -On nitroglycerin infusion currently, resume his carvedilol, amiodarone  Acute hypoxic respiratory failure -Patient to be placed on BiPAP shortly, he will receive Lasix and I suspect he will not need it for more than just a few hours  COPD with possible exacerbation -His clinical presentation is more consistent with fluid overload, however he does have wheezing and reports productive cough which has  increased in the last few days, cannot completely rule out COPD exacerbation, he was given steroids in the ED, will continue, will continue nebulizer treatments as well as add Levaquin.  If he is improving shortly with IV Lasix, may start peeling off antibiotics and quickly taper the steroids  Coronary artery disease -no chest pain, troponin was negative  Hyponatremia -Due to hypervolemia, monitor sodium with diuresis  Hypertension -Resume home medications  Hyperlipidemia -Resume his statin  Hypothyroidism -Check TSH, resume his Synthroid  Alcohol abuse -?  Whether he is in remission or not, given daughter's concerns will place patient on CIWA    DVT prophylaxis: Lovenox Code Status: Full code Family Communication: Discussed with daughter at bedside Disposition Plan: Admit to stepdown Consults called: None  Admission status: Inpatient   At the time of admission, it appears that the appropriate admission status for this patient is INPATIENT. This is judged to be reasonable and necessary in order to provide the required high service intensity to ensure the patient's safety given the presenting symptoms, physical exam findings, and initial radiographic and laboratory data in the context of their chronic comorbidities. Current circumstances are acute on chronic heart failure, COPD exacerbation significant respiratory distress, and it is felt to place patient at high risk for further clinical deterioration threatening life, limb, or organ. Moreover, it is my clinical judgment that the patient will require inpatient hospital care spanning beyond 2 midnights from the point of admission and that early discharge would result in unnecessary risk of decompensation and readmission or threat to life, limb or bodily function.   Marzetta Board, MD [facility] Pager [phone]  If 7PM-7AM, please contact night-coverage [email or web address] Password [facility]  01/12/2017, 4:31 PM

## 2017-01-12 NOTE — ED Notes (Signed)
No respiratory or acute distress noted no reaction to medication noted resting in bed with eyes closed family at bedside call light in reach.

## 2017-01-12 NOTE — ED Triage Notes (Signed)
Pt reports SOB starting last night. States that it has been on and off over the past week. Nonproductive cough noted in triage. A&Ox4. Hx COPD. Does not wear O2 at home.
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
## 2017-01-12 NOTE — ED Notes (Signed)
Spoke w/ hospitalist. Advised to discontinue Nitroglycerin until further notice. This writer discontinued Nitroglycerin. Pt AOx4 at this time and not symptomatic. Will continue to monitor.

## 2017-01-12 NOTE — Progress Notes (Signed)
Patient taken off BiPAP and placed on 2 L [place]. Patient in no distress at this time. RT will continue to monitor patient.

## 2017-01-12 NOTE — ED Notes (Signed)
Pt transported to DG.  

## 2017-01-12 NOTE — ED Notes (Signed)
This Probation officer called into room by RT. Pt blood pressure in 80s. Pt alert and oriented and denies pain. Hospitalist paged to advice plan of care regarding nitro/blood pressure. Primary nurse notified.

## 2017-01-12 NOTE — ED Notes (Signed)
Breathing treatment complete. Bilateral expiratory wheezing remains. Pt reports "it is a little" easier to breathe.

## 2017-01-13 ENCOUNTER — Other Ambulatory Visit (HOSPITAL_COMMUNITY): Payer: Medicare Other

## 2017-01-13 ENCOUNTER — Encounter: Payer: Self-pay | Admitting: Pulmonary Disease

## 2017-01-13 DIAGNOSIS — I2510 Atherosclerotic heart disease of native coronary artery without angina pectoris: Secondary | ICD-10-CM

## 2017-01-13 DIAGNOSIS — J209 Acute bronchitis, unspecified: Secondary | ICD-10-CM

## 2017-01-13 DIAGNOSIS — J440 Chronic obstructive pulmonary disease with acute lower respiratory infection: Secondary | ICD-10-CM

## 2017-01-13 DIAGNOSIS — J9621 Acute and chronic respiratory failure with hypoxia: Secondary | ICD-10-CM

## 2017-01-13 DIAGNOSIS — Z95810 Presence of automatic (implantable) cardiac defibrillator: Secondary | ICD-10-CM

## 2017-01-13 DIAGNOSIS — Z9861 Coronary angioplasty status: Secondary | ICD-10-CM

## 2017-01-13 DIAGNOSIS — I5043 Acute on chronic combined systolic (congestive) and diastolic (congestive) heart failure: Secondary | ICD-10-CM

## 2017-01-13 DIAGNOSIS — I5023 Acute on chronic systolic (congestive) heart failure: Secondary | ICD-10-CM

## 2017-01-13 LAB — BASIC METABOLIC PANEL
ANION GAP: 11 (ref 5–15)
BUN: 28 mg/dL — ABNORMAL HIGH (ref 6–20)
CALCIUM: 9.4 mg/dL (ref 8.9–10.3)
CO2: 25 mmol/L (ref 22–32)
CREATININE: 1.3 mg/dL — AB (ref 0.61–1.24)
Chloride: 99 mmol/L — ABNORMAL LOW (ref 101–111)
GFR calc Af Amer: 57 mL/min — ABNORMAL LOW (ref >60)
GFR, EST NON AFRICAN AMERICAN: 49 mL/min — AB (ref >60)
GLUCOSE: 131 mg/dL — AB (ref 65–99)
Potassium: 5.1 mmol/L (ref 3.5–5.1)
Sodium: 135 mmol/L (ref 135–145)

## 2017-01-13 LAB — TSH: TSH: 1.342 u[IU]/mL (ref 0.350–4.500)

## 2017-01-13 MED ORDER — PREDNISONE 20 MG PO TABS
40.0000 mg | ORAL_TABLET | Freq: Every day | ORAL | Status: DC
  Administered 2017-01-13 – 2017-01-14 (×2): 40 mg via ORAL
  Filled 2017-01-13 (×2): qty 2

## 2017-01-13 MED ORDER — DOXYCYCLINE HYCLATE 100 MG PO TABS
100.0000 mg | ORAL_TABLET | Freq: Two times a day (BID) | ORAL | Status: DC
  Administered 2017-01-13 – 2017-01-14 (×3): 100 mg via ORAL
  Filled 2017-01-13 (×3): qty 1

## 2017-01-13 NOTE — Consult Note (Signed)
Primary cardiologist: Dr Marijo File Consulting cardiologist: Dr Carlyle Dolly Requesting physician: Dr Debbe Odea Indication: acute on chronic systolic HF  Clinical Summary Mr. Strange is a 83 y.o.male history of CAD with prior stenting in 3086, chronic systolic HF LVEF 57%, HTN, AAA repair, carotid stenosis with prior CEA. He had an ICD for secondary prevention with prior VT arrest, for which he is also on amiodarone. H&P indicates history of EtoH abuse, and family is concerned that he is starting to drink again. From clinic notes medical therapy for his CHF has been limited by orthostatic hypotension and dizziness. He was admitted with SOB progressing over the last week. He does admit to some high sodium intake, including country ham over that period of time. No chest pain, no palpitations. Compliant with meds.    K 4.3 Cr 1.05 BNP 1503 WBC 13.7 Hgb 15.7 Plt 128 TSH 1.34  Trop neg  CXR pulmonary edema EKG A-paced, V senses RBBB LPFB with RAD 03/2016 echo: LVEF 84-69%, grade I diastolic dysfunction   Allergies  Allergen Reactions  . Lisinopril     BP dropped too low per daughter    Medications Scheduled Medications: . allopurinol  100 mg Oral Daily  . amiodarone  200 mg Oral Daily  . budesonide (PULMICORT) nebulizer solution  0.25 mg Nebulization BID  . carvedilol  3.125 mg Oral BID WC  . clopidogrel  75 mg Oral Daily  . enoxaparin (LOVENOX) injection  40 mg Subcutaneous Q24H  . folic acid  1 mg Oral Daily  . furosemide  40 mg Intravenous Q12H  . guaiFENesin  600 mg Oral BID  . ipratropium  0.5 mg Nebulization TID  . levalbuterol  0.63 mg Nebulization TID  . levofloxacin (LEVAQUIN) IV  500 mg Intravenous Q24H  . levothyroxine  25 mcg Oral QAC breakfast  . magnesium oxide  200 mg Oral Daily  . multivitamin  1 tablet Oral Daily  . oxybutynin  5 mg Oral BID  . pravastatin  40 mg Oral q1800  . predniSONE  40 mg Oral Q breakfast  . sertraline  50 mg Oral QHS  . sodium  chloride flush  3 mL Intravenous Q12H  . thiamine  100 mg Oral Daily     Infusions:   PRN Medications:  sodium chloride, acetaminophen, ondansetron (ZOFRAN) IV, sodium chloride flush   Past Medical History:  Diagnosis Date  . AAA (abdominal aortic aneurysm) ([place])    a. 2002 s/p repair.  . Abnormal liver function tests   . AICD (automatic cardioverter/defibrillator) present   . Allergic rhinitis   . Atherosclerotic heart disease   . Back pain   . CAD (coronary artery disease)    a. 02/2002 H/o MI with stenting x 2; b. 04/2013 MV: EF 25%, large posterior lateral infarct w/o ischemia; c. 03/2016 VT Arrest/Cath: LM 60ost, LAD 40p/m ISR, LCX 100p/m, RCA nl-->Med Rx.  . Carotid arterial disease ([place])    a. 12/2010 s/p R CEA;  b. 05/2015 Carotid U/S: bilat <40% ICA stenosis.  . Chronic combined systolic and diastolic CHF (congestive heart failure) ([place])    a. 03/2016 Echo: Ef 35-40%, grade 1 DD.  [place] Kitchen Compression fracture   . COPD (chronic obstructive pulmonary disease) ([place])   . Coronary artery disease   . Dermatitis   . Diverticulosis of colon   . DJD (degenerative joint disease)    and Gout  . Hemorrhoids   . History of colonic polyps   . History of  gout   . History of pneumonia   . Hypercholesterolemia   . Hypertension   . Hypertensive heart disease   . Ischemic cardiomyopathy    a. EF prev <35%-->improved to normal by Echo 8/14:  Mild LVH, focal basal hypertrophy, EF 60-65%, normal wall motion, mild BAE, PASP 36; c. 03/2016 Echo: EF 35-40%, Gr1 DD, triv AI, mild MR, mod dil LA, mild-mod TR, PASP 10mmHg.  . Osteoporosis   . Peripheral vascular disease ([place])   . Presence of cardiac defibrillator    a. 03/2008 s/p MDT [id number] Maximo II DR, DC AICD; b. 03/2013: ICD shock for T wave oversensing;  c. 10/2015: collective decision not to replace ICD given improvement in LV fxn; d. VT Arrest-->Gen change to MDT ser # [id number].  . Skin cancer    shoulders and forehead  . Syncope   . T  wave over sensing resulting in inappropriate shocks    a. 03/2013.  . Tobacco abuse   . Ventricular tachycardia ([place]) 04/01/2016    Past Surgical History:  Procedure Laterality Date  . ABDOMINAL AORTIC ANEURYSM REPAIR  2002   by Dr. Kellie Simmering  . aicd placed  03/2008   Dr. Caryl Comes  . cad stent  02/2002   Dr. Percival Spanish  . CARDIAC CATHETERIZATION     X 2 stents  . CARDIAC CATHETERIZATION N/A 04/03/2016   Procedure: Left Heart Cath and Coronary Angiography;  Surgeon: Belva Crome, MD;  Location: [facility];  Service: Cardiovascular;  Laterality: N/A;  . CARDIAC DEFIBRILLATOR PLACEMENT  2009  . CARDIAC DEFIBRILLATOR PLACEMENT    . CAROTID ENDARTERECTOMY Right January 02, 2011  . EAR CYST EXCISION Left 12/13/2015   Procedure: Excision left ear lesion ;  Surgeon: Melissa Montane, MD;  Location: [facility];  Service: ENT;  Laterality: Left;  . EP IMPLANTABLE DEVICE N/A 04/06/2016   Procedure:  ICD Generator Changeout;  Surgeon: Deboraha Sprang, MD;  Location: [facility];  Service: Cardiovascular;  Laterality: N/A;  . EXCISION OF LESION LEFT EAR Left 12/13/2015  . I&D EXTREMITY Left 04/23/2016   Procedure: IRRIGATION AND DEBRIDEMENT HAND;  Surgeon: Leandrew Koyanagi, MD;  Location: [facility];  Service: Orthopedics;  Laterality: Left;  . OPEN REDUCTION INTERNAL FIXATION (ORIF) METACARPAL Left 04/04/2016   Procedure: OPEN REDUCTION INTERNAL FIXATION (ORIF) LEFT 2ND, 3RD, 4TH METACARPAL FRACTURE;  Surgeon: Leandrew Koyanagi, MD;  Location: [facility];  Service: Orthopedics;  Laterality: Left;  OPEN REDUCTION INTERNAL FIXATION (ORIF) LEFT 2ND, 3RD, 4TH METACARPAL FRACTURE  . OPEN REDUCTION INTERNAL FIXATION (ORIF) METACARPAL Left 04/23/2016   Procedure: REVISION OPEN REDUCTION INTERNAL FIXATION (ORIF) 2ND METACARPAL;  Surgeon: Leandrew Koyanagi, MD;  Location: [facility];  Service: Orthopedics;  Laterality: Left;  . right corotid enderectomy  12/2010   Dr. Scot Dock  . SKIN SPLIT GRAFT Left 12/13/2015   Procedure: with possible skin graft;   Surgeon: Melissa Montane, MD;  Location: [facility];  Service: ENT;  Laterality: Left;  . TONSILLECTOMY      Family History  Problem Relation Age of Onset  . Hypertension Father   . Heart disease Father     Heart Disease before age 73  . Hypertension Mother   . Heart disease Mother     Heart Disease before age 46  . Cancer Mother     Social History Mr. Smola reports that he quit smoking about 4 years ago. His smoking use included Cigarettes and Cigars. He has never used smokeless tobacco.  Mr. Nehme reports that he drinks alcohol.  Review of Systems CONSTITUTIONAL: No weight loss, fever, chills, weakness or fatigue.  HEENT: Eyes: No visual loss, blurred vision, double vision or yellow sclerae. No hearing loss, sneezing, congestion, runny nose or sore throat.  SKIN: No rash or itching.  CARDIOVASCULAR: No chest pain, chest pressure or chest discomfort. No palpitations or edema.  RESPIRATORY: No shortness of breath, cough or sputum.  GASTROINTESTINAL: No anorexia, nausea, vomiting or diarrhea. No abdominal pain or blood.  GENITOURINARY: no polyuria, no dysuria NEUROLOGICAL: No headache, dizziness, syncope, paralysis, ataxia, numbness or tingling in the extremities. No change in bowel or bladder control.  MUSCULOSKELETAL: No muscle, back pain, joint pain or stiffness.  HEMATOLOGIC: No anemia, bleeding or bruising.  LYMPHATICS: No enlarged nodes. No history of splenectomy.  PSYCHIATRIC: No history of depression or anxiety.      Physical Examination Blood pressure (!) 153/63, pulse 62, temperature 98.1 F (36.7 C), temperature source Oral, resp. rate (!) 21, height 5\' 11"  (1.803 m), weight 167 lb 15.9 oz (76.2 kg), SpO2 94 %.  Intake/Output Summary (Last 24 hours) at 01/13/17 0858 Last data filed at 01/13/17 0159  Gross per 24 hour  Intake             3.13 ml  Output              775 ml  Net          -771.87 ml    HEENT: sclera clear, throat clear  Cardiovascular: RRR, no m/r/g, n o  jvd  Respiratory CTAB  GI: abdomen soft, NT, ND  MSK: no LE edema  Neuro: no focal deficits  Psych: appropriate affect   Lab Results  Basic Metabolic Panel:  Recent Labs Lab 01/12/17 1501 01/13/17 0314  NA 133* 135  K 4.3 5.1  CL 102 99*  CO2 22 25  GLUCOSE 146* 131*  BUN 20 28*  CREATININE 1.05 1.30*  CALCIUM 9.2 9.4    Liver Function Tests: No results for input(s): AST, ALT, ALKPHOS, BILITOT, PROT, ALBUMIN in the last 168 hours.  CBC:  Recent Labs Lab 01/12/17 1632  WBC 13.7*  NEUTROABS 10.0*  HGB 15.7  HCT 45.5  MCV 95.4  PLT 128*    Cardiac Enzymes: No results for input(s): CKTOTAL, CKMB, CKMBINDEX, TROPONINI in the last 168 hours.  BNP: Invalid input(s): POCBNP   ECG   Imaging   Impression/Recommendations 1. Acute on chronic systolic heart failure - LVEF 35-40% by echo in 03/2016, repeat study is pending - negative 828mL yesterday after one dose of IV lasix. He is on lasix 40mg  IV bid, uptrend in Cr. He received additional dose this AM, will d/c nighttime dose and reassess tomorrow AM - from notes medical therapy has been limited due to orthostasis and dizziness. Continue coreg 3.125mg  bid. He has not been on ACE/ARB/aldactone/ARNI. Apparently bp became very low on ACE-I in the past.  - possible dietary noncompliance as etiology of exacerbation. Given his history of ventricular arrhythmias will need to have device interrogated Monday to rule out arrhythmia playing any role  2. History of VT arrest - has ICD, has been on amio - continue amiodarone  3. CAD - no active issues, continue current meds. Appears he has been on plavix for secondary prevention   Monitor diuresis today along with renal function, device check tomorrow. Likely d/c tomorrow.   Carlyle Dolly, M.D.

## 2017-01-13 NOTE — Progress Notes (Addendum)
PROGRESS NOTE    [name]   ERD:[id number]  DOB: 01-07-1931  DOA: 01/12/2017 PCP: Noralee Space, MD   Brief Narrative:  [name] is a 83 y.o. male with medical history significant of coronary artery disease with multiple prior MIs and stenting, chronic combined CHF with an EF of as low as 25% in the past / ICDin place, improved to 35-40% last year, Aorto-ileal bypass graft, thoracic aortic aneurysm, carotid stenosis with prior CEA, prior history of alcohol abuse, COPD, tobacco abuse in the past, hypertension, hyperlipidemia presents to the hospital with chief complaint of shortness of breath.   Patient has been having progressive shortness of breath over the last week.  He is barely able to complete a sentence in the emergency room and part of the story is per his daughter who is at bedside.  She saw him 2 days ago and he was feeling okay at that time, however they had breakfast this morning and she has noticed that he was significantly short of breath with minimal activity.  Patient denies any weight gain however he does not weigh himself regularly.  It is noted in the cardiology notes that he never had any issues with fluid overload so he was not on diuretics at home.  Patient denies any chest pain.  He has no abdominal pain, has no nausea or vomiting.  He denies any diarrhea.  He denies any lightheadedness or dizziness.  He tells me he is using a pillow to sleep at night, and denies waking up in the middle of the night short of breath.  He also reports an increased cough over the last 3-4 days as well as green sputum production.  Subjective: Feels that dyspnea is much better than yesterday. No dyspnea at rest- has yet to ambulate. No chest pain, palpitations, pedal edema.  When asked about cough he tells me he has been coughing up green stuff since the last time he was in the hospital (7/17) Daughter is concerned he may be drinking with friends. - he states he has drank once in the past  few wks.   Assessment & Plan:   Principal Problem:   Acute on chronic respiratory failure with hypoxia   - improved with pulse ox of 90% on room air and 88% on exertion (A)   Acute on chronic combined systolic and diastolic congestive heart failure  - never needed diuretic- has had dietary indiscretion  - cont IV Lasix- Weight 77 kg >> 76 kg - follow Cr as well - is up today - f/u on ECHO- last ECHO 6/17 - appreciate cardiology eval- they would like to check his AICD tomorrow and state he will likely go home tomorrow    (B) COPD with acute bronchitis -   COPD GOLD III - phlegm improved- not green now - change Levaquin to Doxy due to risk of prolonged QT in patient on Amio with AICD and VT arrest in the past - wean Solumedrol to Prednisone 40 mg - quit smoking 5 yrs ago  Active Problems:    Automatic implantable cardioverter-defibrillator in situ - eval tomorrow per cards    Orthostatic hypotension - check orthostatic vitals this AM- negative- will check again tomorrow esp as we are diuresing him    CAD S/P percutaneous coronary angioplasty  - Plavix/ Statin  DVT prophylaxis: Lovenox Code Status: Full code Family Communication:  Disposition Plan: home tomorrow Consultants:   cardiology Procedures:    Antimicrobials:  Anti-infectives  Start     Dose/Rate Route Frequency Ordered Stop   01/12/17 2200  levofloxacin (LEVAQUIN) IVPB 500 mg     500 mg 100 mL/hr over 60 Minutes Intravenous Every 24 hours 01/12/17 2025         Objective: Vitals:   01/13/17 0600 01/13/17 0800 01/13/17 0830 01/13/17 0851  BP:   (!) 153/63   Pulse: 60  62   Resp: 19  (!) 21   Temp:  98 F (36.7 C)    TempSrc:  Oral    SpO2: 93%   94%  Weight:      Height:        Intake/Output Summary (Last 24 hours) at 01/13/17 1110 Last data filed at 01/13/17 0159  Gross per 24 hour  Intake             3.13 ml  Output              775 ml  Net          -771.87 ml   Filed Weights    01/12/17 2000 01/13/17 0500  Weight: 77 kg (169 lb 12.1 oz) 76.2 kg (167 lb 15.9 oz)    Examination: General exam: Appears comfortable  HEENT: PERRLA, oral mucosa moist, no sclera icterus or thrush Respiratory system:  Coarse Crackles in LLL Respiratory effort normal. Cardiovascular system: S1 & S2 heard, RRR.  No murmurs  Gastrointestinal system: Abdomen soft, non-tender, nondistended. Normal bowel sound. No organomegaly Central nervous system: Alert and oriented. No focal neurological deficits. Extremities: No cyanosis, clubbing or edema Skin: No rashes or ulcers Psychiatry:  Mood & affect appropriate.     Data Reviewed: I have personally reviewed following labs and imaging studies  CBC:  Recent Labs Lab 01/12/17 1632  WBC 13.7*  NEUTROABS 10.0*  HGB 15.7  HCT 45.5  MCV 95.4  PLT 536*   Basic Metabolic Panel:  Recent Labs Lab 01/12/17 1501 01/13/17 0314  NA 133* 135  K 4.3 5.1  CL 102 99*  CO2 22 25  GLUCOSE 146* 131*  BUN 20 28*  CREATININE 1.05 1.30*  CALCIUM 9.2 9.4   GFR: Estimated Creatinine Clearance: 45.9 mL/min (A) (by C-G formula based on SCr of 1.3 mg/dL (H)). Liver Function Tests: No results for input(s): AST, ALT, ALKPHOS, BILITOT, PROT, ALBUMIN in the last 168 hours. No results for input(s): LIPASE, AMYLASE in the last 168 hours. No results for input(s): AMMONIA in the last 168 hours. Coagulation Profile: No results for input(s): INR, PROTIME in the last 168 hours. Cardiac Enzymes: No results for input(s): CKTOTAL, CKMB, CKMBINDEX, TROPONINI in the last 168 hours. BNP (last 3 results) No results for input(s): PROBNP in the last 8760 hours. HbA1C: No results for input(s): HGBA1C in the last 72 hours. CBG: No results for input(s): GLUCAP in the last 168 hours. Lipid Profile: No results for input(s): CHOL, HDL, LDLCALC, TRIG, CHOLHDL, LDLDIRECT in the last 72 hours. Thyroid Function Tests:  Recent Labs  01/13/17 0314  TSH 1.342    Anemia Panel: No results for input(s): VITAMINB12, FOLATE, FERRITIN, TIBC, IRON, RETICCTPCT in the last 72 hours. Urine analysis:    Component Value Date/Time   COLORURINE YELLOW 04/14/2016 [place] 04/14/2016 1028   LABSPEC 1.013 04/14/2016 1028   PHURINE 6.0 04/14/2016 1028   GLUCOSEU NEGATIVE 04/14/2016 1028   HGBUR NEGATIVE 04/14/2016 [place] 04/14/2016 1028   [place] 04/14/2016 1028   PROTEINUR NEGATIVE 04/14/2016 1028  UROBILINOGEN 2.0 (H) 10/28/2012 0820   NITRITE NEGATIVE 04/14/2016 1028   LEUKOCYTESUR NEGATIVE 04/14/2016 1028   Sepsis Labs: @LABRCNTIP (procalcitonin:4,lacticidven:4) ) Recent Results (from the past 240 hour(s))  MRSA PCR Screening     Status: None   Collection Time: 01/12/17  8:27 PM  Result Value Ref Range Status   MRSA by PCR NEGATIVE NEGATIVE Final    Comment:        The GeneXpert MRSA Assay (FDA approved for NASAL specimens only), is one component of a comprehensive MRSA colonization surveillance program. It is not intended to diagnose MRSA infection nor to guide or monitor treatment for MRSA infections.          Radiology Studies: Dg Chest 2 View  Result Date: 01/12/2017 CLINICAL DATA:  Shortness of breath starting last night EXAM: CHEST  2 VIEW COMPARISON:  05/04/2016 FINDINGS: Cardiomegaly is noted. Dual lead cardiac pacemaker is unchanged in position. There is central vascular congestion and mild perihilar and infrahilar interstitial prominence suspicious for pulmonary edema. Small bilateral pleural effusion with bilateral basilar atelectasis or infiltrate. IMPRESSION: There is central vascular congestion and mild perihilar and infrahilar interstitial prominence suspicious for pulmonary edema. Small bilateral pleural effusion with bilateral basilar atelectasis or infiltrate. Electronically Signed   By: Lahoma Crocker M.D.   On: 01/12/2017 15:07      Scheduled Meds: . allopurinol  100  mg Oral Daily  . amiodarone  200 mg Oral Daily  . budesonide (PULMICORT) nebulizer solution  0.25 mg Nebulization BID  . carvedilol  3.125 mg Oral BID WC  . clopidogrel  75 mg Oral Daily  . enoxaparin (LOVENOX) injection  40 mg Subcutaneous Q24H  . folic acid  1 mg Oral Daily  . guaiFENesin  600 mg Oral BID  . ipratropium  0.5 mg Nebulization TID  . levalbuterol  0.63 mg Nebulization TID  . levofloxacin (LEVAQUIN) IV  500 mg Intravenous Q24H  . levothyroxine  25 mcg Oral QAC breakfast  . magnesium oxide  200 mg Oral Daily  . multivitamin  1 tablet Oral Daily  . oxybutynin  5 mg Oral BID  . pravastatin  40 mg Oral q1800  . predniSONE  40 mg Oral Q breakfast  . sertraline  50 mg Oral QHS  . sodium chloride flush  3 mL Intravenous Q12H  . thiamine  100 mg Oral Daily   Continuous Infusions:   LOS: 1 day    Time spent in minutes: 43    [place], MD [facility] Pager: [email or web address] Password [email or web address] 01/13/2017, 11:10 AM

## 2017-01-13 NOTE — Progress Notes (Signed)
Pt seen, no increased wob or respiratory distressed noted or voiced by pt at this time.  Pt remains on 2lnc, HR61, spo2 93%.  Bipap not indicated at this time.  RT will continue to monitor and assess pt.

## 2017-01-13 NOTE — Progress Notes (Signed)
Pt asleep, no increased wob or respiratory distress noted.  Pt remains on 2lnc, spo2 92-93%.  Bipap not indicated at this time.  RT will continue to monitor and assess as needed.

## 2017-01-13 NOTE — Progress Notes (Signed)
Pt was able to walk around half the unit, pt O2 level was 95% before walk, during his walk O2 would intermittently drop to 88%, but would easily come back up to  >90%. Pt did not utilize any assistive device however he did say his legs felt weakened. Pt did not exhibit any signs of SOB. Pt was on room air before, during, and after his walk.

## 2017-01-14 ENCOUNTER — Inpatient Hospital Stay (HOSPITAL_COMMUNITY): Payer: Medicare Other

## 2017-01-14 DIAGNOSIS — J441 Chronic obstructive pulmonary disease with (acute) exacerbation: Secondary | ICD-10-CM

## 2017-01-14 DIAGNOSIS — I482 Chronic atrial fibrillation: Secondary | ICD-10-CM

## 2017-01-14 DIAGNOSIS — I509 Heart failure, unspecified: Secondary | ICD-10-CM

## 2017-01-14 DIAGNOSIS — I481 Persistent atrial fibrillation: Secondary | ICD-10-CM

## 2017-01-14 DIAGNOSIS — I951 Orthostatic hypotension: Secondary | ICD-10-CM

## 2017-01-14 LAB — BASIC METABOLIC PANEL
ANION GAP: 10 (ref 5–15)
BUN: 39 mg/dL — ABNORMAL HIGH (ref 6–20)
CHLORIDE: 100 mmol/L — AB (ref 101–111)
CO2: 25 mmol/L (ref 22–32)
Calcium: 9.1 mg/dL (ref 8.9–10.3)
Creatinine, Ser: 1.13 mg/dL (ref 0.61–1.24)
GFR calc non Af Amer: 58 mL/min — ABNORMAL LOW (ref >60)
GLUCOSE: 119 mg/dL — AB (ref 65–99)
Potassium: 3.7 mmol/L (ref 3.5–5.1)
Sodium: 135 mmol/L (ref 135–145)

## 2017-01-14 LAB — ECHOCARDIOGRAM COMPLETE
HEIGHTINCHES: 71 in
Weight: 2665.6 oz

## 2017-01-14 LAB — BRAIN NATRIURETIC PEPTIDE: B Natriuretic Peptide: 768.2 pg/mL — ABNORMAL HIGH (ref 0.0–100.0)

## 2017-01-14 MED ORDER — PREDNISONE 20 MG PO TABS
40.0000 mg | ORAL_TABLET | Freq: Every day | ORAL | 0 refills | Status: DC
Start: 2017-01-15 — End: 2017-01-22

## 2017-01-14 MED ORDER — ALBUTEROL SULFATE HFA 108 (90 BASE) MCG/ACT IN AERS: 2.0000 | INHALATION_SPRAY | Freq: Four times a day (QID) | RESPIRATORY_TRACT | 2 refills | Status: DC | PRN

## 2017-01-14 MED ORDER — FUROSEMIDE 40 MG PO TABS: 40.0000 mg | ORAL_TABLET | Freq: Every day | ORAL | 0 refills | Status: DC | PRN

## 2017-01-14 MED ORDER — POTASSIUM CHLORIDE ER 20 MEQ PO TBCR: 20.0000 meq | EXTENDED_RELEASE_TABLET | Freq: Every day | ORAL | 0 refills | Status: DC | PRN

## 2017-01-14 MED ORDER — DOXYCYCLINE HYCLATE 100 MG PO TABS: 100.0000 mg | ORAL_TABLET | Freq: Two times a day (BID) | ORAL | 0 refills | Status: DC

## 2017-01-14 NOTE — Discharge Summary (Signed)
Physician Discharge Summary  [name] SEG:[id number] DOB: 03-03-1931 DOA: 01/12/2017  PCP: Jesse Space, MD  Admit date: 01/12/2017 Discharge date: 01/14/2017  Admitted From: home Disposition:  home   Recommendations for Outpatient Follow-up:  1. Ensure he is weighing himself daily and adhering to his diet  Home Health:  none  Equipment/Devices:  none    Discharge Condition:  stable   CODE STATUS:  Full code   Diet recommendation:  Heart heathy, low sodium Consultations:  cardiology    Discharge Diagnoses:  Principal Problem:   Acute on chronic respiratory failure with hypoxia (HCC) Active Problems:   Acute on chronic combined systolic and diastolic congestive heart failure (HCC)   COPD with acute bronchitis (HCC)   COPD GOLD III   Automatic implantable cardioverter-defibrillator in situ   Orthostatic hypotension>> resolved   CAD S/P percutaneous coronary angioplasty    Subjective: Cough improving. No dyspnea.   Brief Summary: Jesse Jesse Floydis a 83 y.o.malewith medical history significant of coronary artery disease with multiple prior MIs and stenting, chronic combined CHF with an EF of as low as 25% in the past / ICDin place, improved to 35-40% last year, Aorto-ileal bypass graft, thoracic aortic aneurysm, carotid stenosis with prior CEA, prior history of alcohol abuse, COPD, tobacco abuse in the past, hypertension, hyperlipidemia presents to the hospital with chief complaint of shortness of breath.  Patient has been having progressive shortness of breath over the last week. He is barely able to complete a sentence in the emergency room and part of the story is per his daughter who is at bedside. She saw him 2 days ago and he was feeling okay at that time, however they had breakfast this morning and she has noticed that he was significantly short of breath with minimal activity. Patient denies any weight gain however he does not weigh himself regularly. It is noted  in the cardiology notes that he never had any issues with fluid overload so he was not on diuretics at home. Patient denies any chest pain. He has no abdominal pain, has no nausea or vomiting. He denies any diarrhea. He denies any lightheadedness or dizziness. He tells me he is using a pillow to sleep at night, and denies waking up in the middle of the night short of breath. He also reports an increased cough over the last 3-4 days as well as green sputum production. When asked about cough he tells me he has been coughing up green stuff since the last time he was in the hospital. Daughter is concerned he may be drinking with friends. He states he has drank once in the past few wks.   Subjective: Feels great. Cough improving. No dyspnea or orthopnea.    Hospital Course:   Principal Problem:   Acute on chronic respiratory failure with hypoxia   -3/25 - dyspnea nearly resolved-  improved with pulse ox of 90% on room air and 88% on exertion - 3/26 pulse ox now 98% on room air and 94% on exertion   (A)   Acute on chronic combined systolic and diastolic congestive heart failure  - never needed diuretic- has had dietary indiscretion  - cont IV Lasix- Weight 77 kg >> 76 kg>> 75 kg - last ECHO 6/17 - EF 35-40% grade 1 d CHF - ECHO today>> EF 40-45% - grade 3 d CHF, mod to severe pulm HTN - appreciate cardiology eval- they recommend checking his AICD which is unrevealing - I will discharge him with  PRN Lasix     (B) COPD with acute bronchitis -   COPD GOLD III - cough and phlegm improved- not green now - changed Levaquin to Doxy due to risk of prolonged QT in patient on Amio with AICD and VT arrest in the past - weaned Solumedrol to Prednisone 40 mg - quit smoking 5 yrs ago  Active Problems:    Automatic implantable cardioverter-defibrillator in situ  - has been interrogated     Orthostatic hypotension -  orthostatic vitals negative     CAD S/P percutaneous coronary  angioplasty  - Plavix/ Statin     Discharge Instructions  Discharge Instructions    (HEART FAILURE PATIENTS) Call MD:  Anytime you have any of the following symptoms: 1) 3 pound weight gain in 24 hours or 5 pounds in 1 week 2) shortness of breath, with or without a dry hacking cough 3) swelling in the hands, feet or stomach 4) if you have to sleep on extra pillows at night in order to breathe.    Complete by:  As directed    Diet - low sodium heart healthy    Complete by:  As directed    Increase activity slowly    Complete by:  As directed      Allergies as of 01/14/2017      Reactions   Lisinopril    BP dropped too low per daughter      Medication List    STOP taking these medications   senna-docusate 8.6-50 MG tablet Commonly known as:  Senokot-S     TAKE these medications   ADVAIR DISKUS 250-50 MCG/DOSE Aepb Generic drug:  Fluticasone-Salmeterol INHALE 1 PUFF TWICE DAILY.   albuterol 108 (90 Base) MCG/ACT inhaler Commonly known as:  PROVENTIL HFA;VENTOLIN HFA Inhale 2 puffs into the lungs every 6 (six) hours as needed for wheezing or shortness of breath.   allopurinol 100 MG tablet Commonly known as:  ZYLOPRIM TAKE 1 TABLET ONCE DAILY.   amiodarone 200 MG tablet Commonly known as:  PACERONE TAKE 1 TABLET ONCE DAILY.   carvedilol 3.125 MG tablet Commonly known as:  COREG Take 1 tablet (3.125 mg total) by mouth 2 (two) times daily with a meal.   cholecalciferol 1000 units tablet Commonly known as:  VITAMIN D Take 1,000 Units by mouth daily.   clopidogrel 75 MG tablet Commonly known as:  PLAVIX TAKE 1 TABLET ONCE DAILY.   doxycycline 100 MG tablet Commonly known as:  VIBRA-TABS Take 1 tablet (100 mg total) by mouth every 12 (twelve) hours.   furosemide 40 MG tablet Commonly known as:  LASIX Take 1 tablet (40 mg total) by mouth daily as needed. For weight gain.   guaiFENesin 600 MG 12 hr tablet Commonly known as:  MUCINEX Take 600 mg by mouth 2 (two)  times daily.   levothyroxine 25 MCG tablet Commonly known as:  SYNTHROID, LEVOTHROID TAKE 1 TABLET IN THE MORNING ON AN EMPTY STOMACH.   magnesium oxide 400 (241.3 Mg) MG tablet Commonly known as:  MAG-OX Take 0.5 tablets (200 mg total) by mouth daily.   multivitamin capsule Take 1 capsule by mouth daily.   oxybutynin 5 MG tablet Commonly known as:  DITROPAN TAKE 1 TABLET TWICE DAILY.   polyethylene glycol powder powder Commonly known as:  GLYCOLAX/MIRALAX DISSOLVE ONE CAPFUL IN 8 0Z. WATER TWICE DAILY.   Potassium Chloride ER 20 MEQ Tbcr Take 20 mEq by mouth daily as needed. Take each time you take Lasix (Furosemide)  pravastatin 40 MG tablet Commonly known as:  PRAVACHOL Take 1 tablet (40 mg total) by mouth daily.   predniSONE 20 MG tablet Commonly known as:  DELTASONE Take 2 tablets (40 mg total) by mouth daily with breakfast. Start taking on:  01/15/2017   PRESERVISION/LUTEIN Caps Take 1 capsule by mouth daily.   sertraline 50 MG tablet Commonly known as:  ZOLOFT TAKE ONE TABLET AT BEDTIME. What changed:  See the new instructions.   thiamine 100 MG tablet Take 1 tablet (100 mg total) by mouth daily.      Follow-up Information    NADEL,SCOTT M, MD. Schedule an appointment as soon as possible for a visit in 1 week(s).   Specialty:  Pulmonary Disease Contact information: [place] [address] [phone]          Allergies  Allergen Reactions  . Lisinopril     BP dropped too low per daughter     Procedures/Studies: Left ventricle: The cavity size was normal. There was moderate   concentric hypertrophy. Systolic function was mildly to   moderately reduced. The estimated ejection fraction was in the   range of 40% to 45%. Diffuse hypokinesis slightly worse in the   inferolateral wall. Doppler parameters are consistent with a   reversible restrictive pattern, indicative of decreased left   ventricular diastolic compliance and/or  increased left atrial   pressure (grade 3 diastolic dysfunction). Doppler parameters are   consistent with high ventricular filling pressure. - Aortic valve: Valve mobility was restricted. There was mild   stenosis. There was no regurgitation. Valve area (VTI): 0.9 cm^2.   Valve area (Vmax): 0.87 cm^2. Valve area (Vmean): 0.89 cm^2. - Mitral valve: Transvalvular velocity was within the normal range.   There was no evidence for stenosis. There was mild regurgitation. - Left atrium: The atrium was severely dilated. - Right ventricle: The cavity size was normal. Wall thickness was   normal. Systolic function was normal. - Atrial septum: No defect or patent foramen ovale was identified   by color flow Doppler. - Tricuspid valve: There was mild regurgitation. - Pulmonary arteries: Systolic pressure was moderately to severely   increased. PA peak pressure: 58 mm Hg (S).  Dg Chest 2 View  Result Date: 01/12/2017 CLINICAL DATA:  Shortness of breath starting last night EXAM: CHEST  2 VIEW COMPARISON:  05/04/2016 FINDINGS: Cardiomegaly is noted. Dual lead cardiac pacemaker is unchanged in position. There is central vascular congestion and mild perihilar and infrahilar interstitial prominence suspicious for pulmonary edema. Small bilateral pleural effusion with bilateral basilar atelectasis or infiltrate. IMPRESSION: There is central vascular congestion and mild perihilar and infrahilar interstitial prominence suspicious for pulmonary edema. Small bilateral pleural effusion with bilateral basilar atelectasis or infiltrate. Electronically Signed   By: Lahoma Crocker M.D.   On: 01/12/2017 15:07       Discharge Exam: Vitals:   01/14/17 0619 01/14/17 1434  BP: 134/68 128/73  Pulse: 64 69  Resp: 16 18  Temp: 97.8 F (36.6 C) 97.7 F (36.5 C)   Vitals:   01/14/17 0810 01/14/17 1335 01/14/17 1430 01/14/17 1434  BP:    128/73  Pulse:    69  Resp:    18  Temp:    97.7 F (36.5 C)  TempSrc:    Oral   SpO2: 98% 98% 94% 98%  Weight:      Height:        General: Pt is alert, awake, not in acute distress  Cardiovascular: RRR, S1/S2 +, no rubs, no gallops Respiratory: CTA bilaterally, no wheezing, no rhonchi Abdominal: Soft, NT, ND, bowel sounds + Extremities: no edema, no cyanosis    The results of significant diagnostics from this hospitalization (including imaging, microbiology, ancillary and laboratory) are listed below for reference.     Microbiology: Recent Results (from the past 240 hour(s))  MRSA PCR Screening     Status: None   Collection Time: 01/12/17  8:27 PM  Result Value Ref Range Status   MRSA by PCR NEGATIVE NEGATIVE Final    Comment:        The GeneXpert MRSA Assay (FDA approved for NASAL specimens only), is one component of a comprehensive MRSA colonization surveillance program. It is not intended to diagnose MRSA infection nor to guide or monitor treatment for MRSA infections.      Labs: BNP (last 3 results)  Recent Labs  01/12/17 1501 01/14/17 0507  BNP 1,503.0* 086.5*   Basic Metabolic Panel:  Recent Labs Lab 01/12/17 1501 01/13/17 0314 01/14/17 0507  NA 133* 135 135  K 4.3 5.1 3.7  CL 102 99* 100*  CO2 22 25 25   GLUCOSE 146* 131* 119*  BUN 20 28* 39*  CREATININE 1.05 1.30* 1.13  CALCIUM 9.2 9.4 9.1   Liver Function Tests: No results for input(s): AST, ALT, ALKPHOS, BILITOT, PROT, ALBUMIN in the last 168 hours. No results for input(s): LIPASE, AMYLASE in the last 168 hours. No results for input(s): AMMONIA in the last 168 hours. CBC:  Recent Labs Lab 01/12/17 1632  WBC 13.7*  NEUTROABS 10.0*  HGB 15.7  HCT 45.5  MCV 95.4  PLT 128*   Cardiac Enzymes: No results for input(s): CKTOTAL, CKMB, CKMBINDEX, TROPONINI in the last 168 hours. BNP: Invalid input(s): POCBNP CBG: No results for input(s): GLUCAP in the last 168 hours. D-Dimer No results for input(s): DDIMER in the last 72 hours. Hgb A1c No results for  input(s): HGBA1C in the last 72 hours. Lipid Profile No results for input(s): CHOL, HDL, LDLCALC, TRIG, CHOLHDL, LDLDIRECT in the last 72 hours. Thyroid function studies  Recent Labs  01/13/17 0314  TSH 1.342   Anemia work up No results for input(s): VITAMINB12, FOLATE, FERRITIN, TIBC, IRON, RETICCTPCT in the last 72 hours. Urinalysis    Component Value Date/Time   COLORURINE YELLOW 04/14/2016 1028   APPEARANCEUR CLEAR 04/14/2016 1028   LABSPEC 1.013 04/14/2016 1028   PHURINE 6.0 04/14/2016 1028   GLUCOSEU NEGATIVE 04/14/2016 1028   HGBUR NEGATIVE 04/14/2016 1028   BILIRUBINUR NEGATIVE 04/14/2016 1028   KETONESUR NEGATIVE 04/14/2016 1028   PROTEINUR NEGATIVE 04/14/2016 1028   UROBILINOGEN 2.0 (H) 10/28/2012 0820   NITRITE NEGATIVE 04/14/2016 1028   LEUKOCYTESUR NEGATIVE 04/14/2016 1028   Sepsis Labs Invalid input(s): PROCALCITONIN,  WBC,  LACTICIDVEN Microbiology Recent Results (from the past 240 hour(s))  MRSA PCR Screening     Status: None   Collection Time: 01/12/17  8:27 PM  Result Value Ref Range Status   MRSA by PCR NEGATIVE NEGATIVE Final    Comment:        The GeneXpert MRSA Assay (FDA approved for NASAL specimens only), is one component of a comprehensive MRSA colonization surveillance program. It is not intended to diagnose MRSA infection nor to guide or monitor treatment for MRSA infections.      Time coordinating discharge: Over 30 minutes  SIGNED:   Debbe Odea, MD  [facility] 01/14/2017, 4:26 PM Pager   If 7PM-7AM, please contact night-coverage [email or web address]  Password [email or web address]

## 2017-01-14 NOTE — Discharge Instructions (Signed)
If you gain 2 lbs in 24 hrs take Lasix daily and cut back on the fluids you drink until you are back at your normal weight. Weigh yourself daily and write it down.   Please take all your medications with you for your next visit with your Primary MD. Please request your Primary MD to go over all hospital test results at the follow up. Please ask your Primary MD to get all Hospital records sent to his/her office.  If you experience worsening of your admission symptoms, develop shortness of breath, chest pain, suicidal or homicidal thoughts or a life threatening emergency, you must seek medical attention immediately by calling 911 or calling your MD.  [name] must read the complete instructions/literature along with all the possible adverse reactions/side effects for all the medicines you take including new medications that have been prescribed to you. Take new medicines after you have completely understood and accpet all the possible adverse reactions/side effects.   Do not drive when taking pain medications or sedatives.    Do not take more than prescribed Pain, Sleep and Anxiety Medications  If you have smoked or chewed Tobacco in the last 2 yrs please stop. Stop any regular alcohol and or recreational drug use.  Wear Seat belts while driving.

## 2017-01-14 NOTE — Progress Notes (Signed)
  Echocardiogram 2D Echocardiogram has been performed.  Tresa Res 01/14/2017, 3:03 PM
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
## 2017-01-15 DIAGNOSIS — M79674 Pain in right toe(s): Secondary | ICD-10-CM | POA: Diagnosis not present

## 2017-01-15 DIAGNOSIS — M2041 Other hammer toe(s) (acquired), right foot: Secondary | ICD-10-CM | POA: Diagnosis not present

## 2017-01-22 ENCOUNTER — Encounter: Payer: Self-pay | Admitting: Pulmonary Disease

## 2017-01-22 ENCOUNTER — Other Ambulatory Visit (INDEPENDENT_AMBULATORY_CARE_PROVIDER_SITE_OTHER): Payer: Medicare Other

## 2017-01-22 ENCOUNTER — Ambulatory Visit (INDEPENDENT_AMBULATORY_CARE_PROVIDER_SITE_OTHER)
Admission: RE | Admit: 2017-01-22 | Discharge: 2017-01-22 | Disposition: A | Discharge status: Home / Self Care | Payer: Medicare Other | Source: Ambulatory Visit | Attending: Pulmonary Disease | Admitting: Pulmonary Disease

## 2017-01-22 ENCOUNTER — Ambulatory Visit (INDEPENDENT_AMBULATORY_CARE_PROVIDER_SITE_OTHER): Payer: Medicare Other | Admitting: Pulmonary Disease

## 2017-01-22 VITALS — BP 124/62 | HR 60 | Temp 96.9°F | Ht 71.0 in | Wt 169.4 lb

## 2017-01-22 DIAGNOSIS — I5042 Chronic combined systolic (congestive) and diastolic (congestive) heart failure: Secondary | ICD-10-CM

## 2017-01-22 DIAGNOSIS — I10 Essential (primary) hypertension: Secondary | ICD-10-CM

## 2017-01-22 DIAGNOSIS — I779 Disorder of arteries and arterioles, unspecified: Secondary | ICD-10-CM | POA: Diagnosis not present

## 2017-01-22 DIAGNOSIS — J90 Pleural effusion, not elsewhere classified: Secondary | ICD-10-CM | POA: Diagnosis not present

## 2017-01-22 DIAGNOSIS — I739 Peripheral vascular disease, unspecified: Secondary | ICD-10-CM | POA: Diagnosis not present

## 2017-01-22 DIAGNOSIS — L820 Inflamed seborrheic keratosis: Secondary | ICD-10-CM | POA: Diagnosis not present

## 2017-01-22 DIAGNOSIS — Z95810 Presence of automatic (implantable) cardiac defibrillator: Secondary | ICD-10-CM

## 2017-01-22 DIAGNOSIS — Z85828 Personal history of other malignant neoplasm of skin: Secondary | ICD-10-CM | POA: Diagnosis not present

## 2017-01-22 DIAGNOSIS — J9621 Acute and chronic respiratory failure with hypoxia: Secondary | ICD-10-CM | POA: Diagnosis not present

## 2017-01-22 DIAGNOSIS — J449 Chronic obstructive pulmonary disease, unspecified: Secondary | ICD-10-CM

## 2017-01-22 DIAGNOSIS — I712 Thoracic aortic aneurysm, without rupture, unspecified: Secondary | ICD-10-CM

## 2017-01-22 DIAGNOSIS — Z9861 Coronary angioplasty status: Secondary | ICD-10-CM

## 2017-01-22 DIAGNOSIS — M150 Primary generalized (osteo)arthritis: Secondary | ICD-10-CM | POA: Diagnosis not present

## 2017-01-22 DIAGNOSIS — M159 Polyosteoarthritis, unspecified: Secondary | ICD-10-CM

## 2017-01-22 DIAGNOSIS — Z08 Encounter for follow-up examination after completed treatment for malignant neoplasm: Secondary | ICD-10-CM | POA: Diagnosis not present

## 2017-01-22 DIAGNOSIS — I255 Ischemic cardiomyopathy: Secondary | ICD-10-CM | POA: Diagnosis not present

## 2017-01-22 DIAGNOSIS — C44629 Squamous cell carcinoma of skin of left upper limb, including shoulder: Secondary | ICD-10-CM | POA: Diagnosis not present

## 2017-01-22 DIAGNOSIS — I2510 Atherosclerotic heart disease of native coronary artery without angina pectoris: Secondary | ICD-10-CM

## 2017-01-22 LAB — BASIC METABOLIC PANEL
BUN: 19 mg/dL (ref 6–23)
CALCIUM: 9.2 mg/dL (ref 8.4–10.5)
CO2: 22 mEq/L (ref 19–32)
Chloride: 102 mEq/L (ref 96–112)
Creatinine, Ser: 0.94 mg/dL (ref 0.40–1.50)
GFR: 81.29 mL/min (ref >60.00)
Glucose, Bld: 70 mg/dL (ref 70–99)
Potassium: 5 mEq/L (ref 3.5–5.1)
SODIUM: 134 meq/L — AB (ref 135–145)

## 2017-01-22 LAB — BRAIN NATRIURETIC PEPTIDE: Pro B Natriuretic peptide (BNP): 496 pg/mL — ABNORMAL HIGH (ref 0.0–100.0)

## 2017-01-22 NOTE — Progress Notes (Signed)
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 tablet (100 mg total) by mouth every 12 (twelve) hours.   PREDNISONE (DELTASONE) 20 MG TABLET    Take 2 tablets (40 mg total) by mouth daily with breakfast.

## 2017-01-22 NOTE — Patient Instructions (Signed)
Today we updated your med list in our EPIC system...    Continue your current medications the same for now...  Today we rechecked your CXR & blood work...    We will call you w/ the results and see if we need a daily dose of Lasix...  REMEMBER-- NO SALT  Great job w/ the [employer] vs the cardiac rehab program...  Call for any questions...  Let's plan a follow up visit in 3 weeks to monitor your weights and fluid.Marland KitchenMarland Kitchen

## 2017-01-23 ENCOUNTER — Ambulatory Visit (INDEPENDENT_AMBULATORY_CARE_PROVIDER_SITE_OTHER): Payer: Medicare Other | Admitting: Internal Medicine

## 2017-01-23 ENCOUNTER — Encounter: Payer: Self-pay | Admitting: Internal Medicine

## 2017-01-23 VITALS — BP 124/66 | HR 60 | Ht 71.0 in | Wt 169.0 lb

## 2017-01-23 DIAGNOSIS — I472 Ventricular tachycardia, unspecified: Secondary | ICD-10-CM

## 2017-01-23 DIAGNOSIS — Z95810 Presence of automatic (implantable) cardiac defibrillator: Secondary | ICD-10-CM

## 2017-01-23 DIAGNOSIS — I255 Ischemic cardiomyopathy: Secondary | ICD-10-CM | POA: Diagnosis not present

## 2017-01-23 DIAGNOSIS — I2589 Other forms of chronic ischemic heart disease: Secondary | ICD-10-CM

## 2017-01-23 DIAGNOSIS — I5042 Chronic combined systolic (congestive) and diastolic (congestive) heart failure: Secondary | ICD-10-CM

## 2017-01-23 LAB — CUP PACEART INCLINIC DEVICE CHECK
Brady Statistic AS VP Percent: 0.02 %
Brady Statistic AS VS Percent: 17.51 %
Brady Statistic RA Percent Paced: 82.07 %
Date Time Interrogation Session: 20180404152707
HighPow Impedance: 40 Ohm
HighPow Impedance: 56 Ohm
Implantable Lead Implant Date: 20090623
Implantable Lead Location: 753859
Implantable Pulse Generator Implant Date: 20170616
Lead Channel Impedance Value: 399 Ohm
Lead Channel Impedance Value: 418 Ohm
Lead Channel Pacing Threshold Amplitude: 1 V
Lead Channel Setting Pacing Amplitude: 2.25 V
Lead Channel Setting Pacing Amplitude: 2.5 V
MDC IDC LEAD IMPLANT DT: 20090623
MDC IDC LEAD LOCATION: 753860
MDC IDC MSMT BATTERY REMAINING LONGEVITY: 111 mo
MDC IDC MSMT BATTERY VOLTAGE: 3.02 V
MDC IDC MSMT LEADCHNL RA PACING THRESHOLD AMPLITUDE: 1.25 V
MDC IDC MSMT LEADCHNL RA PACING THRESHOLD PULSEWIDTH: 0.4 ms
MDC IDC MSMT LEADCHNL RA SENSING INTR AMPL: 1.25 mV
MDC IDC MSMT LEADCHNL RV IMPEDANCE VALUE: 304 Ohm
MDC IDC MSMT LEADCHNL RV PACING THRESHOLD PULSEWIDTH: 0.4 ms
MDC IDC MSMT LEADCHNL RV SENSING INTR AMPL: 12.625 mV
MDC IDC SET LEADCHNL RV PACING PULSEWIDTH: 0.4 ms
MDC IDC SET LEADCHNL RV SENSING SENSITIVITY: 0.6 mV
MDC IDC STAT BRADY AP VP PERCENT: 0.06 %
MDC IDC STAT BRADY AP VS PERCENT: 82.41 %
MDC IDC STAT BRADY RV PERCENT PACED: 0.08 %

## 2017-01-23 MED ORDER — FUROSEMIDE 40 MG PO TABS: 40.0000 mg | ORAL_TABLET | ORAL | 3 refills | Status: DC

## 2017-01-23 MED ORDER — POTASSIUM CHLORIDE ER 20 MEQ PO TBCR: 20.0000 meq | EXTENDED_RELEASE_TABLET | ORAL | 3 refills | Status: DC
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
## 2017-01-23 NOTE — Addendum Note (Signed)
Addended by: Bobby Rumpf C on: 01/23/2017 01:05 PM   Modules accepted: Orders

## 2017-01-23 NOTE — Patient Instructions (Addendum)
Medication Instructions: Your physician has recommended you make the following change in your medication:  --1. CHANGE Furosemide 40 mg - Take 1 tablet by mouth EVERY OTHER day. --2. CHANGE Potassium Chloride 20 mEq - Take 1 tablet by mouth EVERY OTHER day   Labwork: None Ordered  Procedures/Testing: None Ordered  Follow-Up: Your physician recommends that you schedule a follow-up appointment in 2 MONTHS with Dr. Percival Spanish.    Any Additional Special Instructions Will Be Listed Below (If Applicable).  -- You are also being referred to Sharman Cheek, RN for fluid monitoring. She will be contacting you or your daughter to make arrangements.    If you need a refill on your cardiac medications before your next appointment, please call your pharmacy.

## 2017-02-04 ENCOUNTER — Telehealth: Payer: Self-pay

## 2017-02-04 ENCOUNTER — Ambulatory Visit (INDEPENDENT_AMBULATORY_CARE_PROVIDER_SITE_OTHER): Payer: Medicare Other

## 2017-02-04 DIAGNOSIS — I5042 Chronic combined systolic (congestive) and diastolic (congestive) heart failure: Secondary | ICD-10-CM

## 2017-02-04 DIAGNOSIS — Z95810 Presence of automatic (implantable) cardiac defibrillator: Secondary | ICD-10-CM

## 2017-02-04 NOTE — Progress Notes (Signed)
EPIC Encounter for ICM Monitoring  Patient Name: [name] is a 83 y.o. male Date: 02/04/2017 Primary Care Physican: Noralee Space, MD Primary Cardiologist: Hochrein Electrophysiologist: Faustino Congress Weight:unknown       Attempted call to daughter, [name], DPR and left voice mail message to return call.  Hospitalized 01/12/2017 to 01/14/2017 for HF   Thoracic impedance abnormal suggesting fluid accumulation for last 2 days.  Prescribed dosage: Furosemide 40 mg 1 tablet every other day and when taking Furosemide, take Potassium 20 mEq 1 tablet every other day.   Labs: 01/22/2017 Creatinine 0.94, BUN 19, Potassium 5.0, Sodium 134 01/14/2017 Creatinine 1.13, BUN 39, Potassium 3.7, Sodium 135, EGFR 58->60  01/13/2017 Creatinine 1.30, BUN 28, Potassium 5.1, Sodium 135, EGFR 49-57  11/13/2016 Creatinine 1.13, BUN 17, Potassium 4.6, Sodium 139 09/19/2017Creatinine 1.06, BUN 18, Potassium 4.6, Sodium 133  09/14/2017Creatinine 0.98, BUN 15, Potassium 4.9, Sodium 138  08/16/2017Creatinine 0.84, BUN 11, Potassium 4.8, Sodium 137  05/16/2016 Creatinine 1.07, BUN 16, Potassium 5.1, Sodium 127   Recommendations: NONE - Unable to reach patient   Follow-up plan: ICM clinic phone appointment on 02/12/2017 to recheck fluid levels.   Copy of ICM check sent to primary cardiologist and device physician.   3 month ICM trend: 02/04/2017   1 Year ICM trend:      [name], RN 02/04/2017 9:48 AM

## 2017-02-04 NOTE — Progress Notes (Signed)
Received call back from daughter [name].  She reported patient has cough with chest congestion and has gained 1 lb in the last few days.  She did not know his exact weight.  He had been taking Furosemide 40 mg every other day as prescribed.  Reviewed transmission and explained for the last 2 days he has some fluid retention.  Advised will review with Dr Caryl Comes in the office tomorrow and call her back with his recommendations.

## 2017-02-04 NOTE — Telephone Encounter (Signed)
Remote ICM transmission received.  Attempted call to daughter, [name], [place] and left message to return call.

## 2017-02-05 NOTE — Progress Notes (Signed)
Call back to [name], [place].  Advised reviewed transmission and symptoms with Dr Caryl Comes in the office and he advised patient to take Furosemide 40 mg 1 tablet and Potassium 20 mEq 1 tablet daily x 5 consecutive days and after 5th day to resume prior dosage of Furosemide 40 mg 1 tablet and Potassium 20 mEq 1 tablet every other day.  She verbalized understanding.  Will recheck fluid levels on 02/12/2017.  Advised if patient's symptoms worsen to call back or use emergency number if needed.

## 2017-02-09 ENCOUNTER — Other Ambulatory Visit: Payer: Self-pay | Admitting: Pulmonary Disease

## 2017-02-12 ENCOUNTER — Ambulatory Visit (INDEPENDENT_AMBULATORY_CARE_PROVIDER_SITE_OTHER): Payer: Self-pay

## 2017-02-12 DIAGNOSIS — I5042 Chronic combined systolic (congestive) and diastolic (congestive) heart failure: Secondary | ICD-10-CM

## 2017-02-12 DIAGNOSIS — Z95810 Presence of automatic (implantable) cardiac defibrillator: Secondary | ICD-10-CM

## 2017-02-12 DIAGNOSIS — M79674 Pain in right toe(s): Secondary | ICD-10-CM | POA: Diagnosis not present

## 2017-02-12 DIAGNOSIS — M2041 Other hammer toe(s) (acquired), right foot: Secondary | ICD-10-CM | POA: Diagnosis not present

## 2017-02-12 NOTE — Progress Notes (Signed)
EPIC Encounter for ICM Monitoring  Patient Name: [name] is a 83 y.o. male Date: 02/12/2017 Primary Care Physican: Noralee Space, MD Primary Cardiologist: Hochrein Electrophysiologist: Faustino Congress Weight:unknown      Call back to [name], [place].  Heart Failure questions reviewed, pt reported he no longer has congestion   Thoracic impedance returned to normal after taking 5 days of increased Furosemide (started 4/16).  Prescribed dosage: Furosemide 40 mg 1 tablet every other day and when taking Furosemide, take Potassium 20 mEq 1 tablet every other day.   Labs: 01/22/2017 Creatinine 0.94, BUN 19, Potassium 5.0, Sodium 134 01/14/2017 Creatinine 1.13, BUN 39, Potassium 3.7, Sodium 135, EGFR 58->60  01/13/2017 Creatinine 1.30, BUN 28, Potassium 5.1, Sodium 135, EGFR 49-57  11/13/2016 Creatinine 1.13, BUN 17, Potassium 4.6, Sodium 139 09/19/2017Creatinine 1.06, BUN 18, Potassium 4.6, Sodium 133  09/14/2017Creatinine 0.98, BUN 15, Potassium 4.9, Sodium 138  08/16/2017Creatinine 0.84, BUN 11, Potassium 4.8, Sodium 137  05/16/2016 Creatinine 1.07, BUN 16, Potassium 5.1, Sodium 127   Recommendations: No changes. Discussed to limit salt intake to 2000 mg/day and fluid intake to < 2 liters/day.  Encouraged to call for fluid symptoms.  Follow-up plan: ICM clinic phone appointment on 03/07/2017.    Copy of ICM check sent to primary cardiologist and device physician.   3 month ICM trend: 02/12/2017   1 Year ICM trend:      [name], RN 02/12/2017 10:23 AM

## 2017-02-13 ENCOUNTER — Encounter: Payer: Self-pay | Admitting: Pulmonary Disease

## 2017-02-13 ENCOUNTER — Ambulatory Visit (INDEPENDENT_AMBULATORY_CARE_PROVIDER_SITE_OTHER): Payer: Medicare Other | Admitting: Pulmonary Disease

## 2017-02-13 VITALS — BP 136/84 | HR 79 | Temp 97.3°F | Ht 71.0 in | Wt 168.1 lb

## 2017-02-13 DIAGNOSIS — I5042 Chronic combined systolic (congestive) and diastolic (congestive) heart failure: Secondary | ICD-10-CM | POA: Diagnosis not present

## 2017-02-13 DIAGNOSIS — Z95810 Presence of automatic (implantable) cardiac defibrillator: Secondary | ICD-10-CM

## 2017-02-13 DIAGNOSIS — Z9861 Coronary angioplasty status: Secondary | ICD-10-CM | POA: Diagnosis not present

## 2017-02-13 DIAGNOSIS — J449 Chronic obstructive pulmonary disease, unspecified: Secondary | ICD-10-CM | POA: Diagnosis not present

## 2017-02-13 DIAGNOSIS — I2510 Atherosclerotic heart disease of native coronary artery without angina pectoris: Secondary | ICD-10-CM | POA: Diagnosis not present

## 2017-02-13 DIAGNOSIS — I255 Ischemic cardiomyopathy: Secondary | ICD-10-CM | POA: Diagnosis not present

## 2017-02-13 DIAGNOSIS — I10 Essential (primary) hypertension: Secondary | ICD-10-CM

## 2017-02-13 DIAGNOSIS — I272 Pulmonary hypertension, unspecified: Secondary | ICD-10-CM | POA: Diagnosis not present

## 2017-02-13 NOTE — Progress Notes (Signed)
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 Synthroid25 started at that time-- we will f/u labs on return...      HYPONATREMIA> on labs in [facility] 03/2016, likely SIADH, on fluid restrict & we are following=> resolved...   Patient's Medications  New Prescriptions   No medications on file  Previous Medications   ADVAIR DISKUS 250-50 MCG/DOSE AEPB    INHALE 1 PUFF TWICE DAILY.   ALBUTEROL (PROVENTIL HFA;VENTOLIN HFA) 108 (90 BASE) MCG/ACT INHALER    Inhale 2 puffs into the lungs every 6 (six) hours as needed for wheezing or shortness of breath.   ALLOPURINOL (ZYLOPRIM) 100 MG TABLET    TAKE 1 TABLET ONCE DAILY.   AMIODARONE (PACERONE) 200 MG TABLET    TAKE 1 TABLET ONCE DAILY.   CARVEDILOL (COREG) 3.125 MG TABLET    Take 1 tablet (3.125 mg total) by mouth 2 (two) times daily with a meal.   CHOLECALCIFEROL (VITAMIN D) 1000 UNITS TABLET    Take 1,000 Units by mouth daily.   CLOPIDOGREL (PLAVIX) 75 MG TABLET    TAKE 1 TABLET ONCE DAILY.   FUROSEMIDE (LASIX) 40 MG TABLET    Take 1 tablet (40 mg total) by mouth every other day.   GUAIFENESIN (MUCINEX) 600 MG 12 HR TABLET    Take 600 mg by mouth 2 (two) times  daily.   LEVOTHYROXINE (SYNTHROID, LEVOTHROID) 25 MCG TABLET    TAKE 1 TABLET IN THE MORNING ON AN EMPTY STOMACH.   MAGNESIUM OXIDE (MAG-OX) 400 (241.3 MG) MG TABLET    Take 0.5 tablets (200 mg total) by mouth daily.   MULTIPLE VITAMIN (MULTIVITAMIN) CAPSULE    Take 1 capsule by mouth daily.   MULTIPLE VITAMINS-MINERALS (PRESERVISION/LUTEIN) CAPS    Take 1 capsule by mouth daily.    OXYBUTYNIN (DITROPAN) 5 MG TABLET    TAKE 1 TABLET TWICE DAILY.   POLYETHYLENE GLYCOL POWDER (GLYCOLAX/MIRALAX) POWDER    DISSOLVE ONE CAPFUL IN 8 0Z. WATER TWICE DAILY.   POTASSIUM CHLORIDE ER 20 MEQ TBCR    Take 20 mEq by mouth every other day. Take each time you take Lasix (Furosemide)   PRAVASTATIN (PRAVACHOL) 40 MG TABLET    Take 1 tablet (40 mg total) by mouth daily.   SERTRALINE (ZOLOFT) 50 MG TABLET    TAKE ONE TABLET AT BEDTIME.   THIAMINE 100 MG TABLET    Take 1 tablet (100 mg total) by mouth daily.  Modified Medications   No medications on file  Discontinued Medications   No medications on file

## 2017-02-13 NOTE — Patient Instructions (Signed)
Today we updated your med list in our EPIC system...    Continue your current medications the same...  Continue to monitor your weights at home...  Continue follow up w/ DrKlein & the ICM monitoring...  Call for any questions or if I can be of service in any way...  Let's plan a follow up visit in 1mo, sooner if needed for problems.Marland KitchenMarland Kitchen

## 2017-02-27 ENCOUNTER — Other Ambulatory Visit: Payer: Self-pay | Admitting: Pulmonary Disease

## 2017-03-03 ENCOUNTER — Other Ambulatory Visit: Payer: Self-pay | Admitting: Pulmonary Disease

## 2017-03-05 DIAGNOSIS — Z85828 Personal history of other malignant neoplasm of skin: Secondary | ICD-10-CM | POA: Diagnosis not present

## 2017-03-05 DIAGNOSIS — C44529 Squamous cell carcinoma of skin of other part of trunk: Secondary | ICD-10-CM | POA: Diagnosis not present

## 2017-03-05 DIAGNOSIS — Z08 Encounter for follow-up examination after completed treatment for malignant neoplasm: Secondary | ICD-10-CM | POA: Diagnosis not present

## 2017-03-05 DIAGNOSIS — C44629 Squamous cell carcinoma of skin of left upper limb, including shoulder: Secondary | ICD-10-CM | POA: Diagnosis not present

## 2017-03-06 ENCOUNTER — Ambulatory Visit: Payer: Medicare Other | Admitting: Pulmonary Disease

## 2017-03-07 ENCOUNTER — Ambulatory Visit (INDEPENDENT_AMBULATORY_CARE_PROVIDER_SITE_OTHER): Payer: Medicare Other

## 2017-03-07 DIAGNOSIS — I5042 Chronic combined systolic (congestive) and diastolic (congestive) heart failure: Secondary | ICD-10-CM | POA: Diagnosis not present

## 2017-03-07 DIAGNOSIS — Z95810 Presence of automatic (implantable) cardiac defibrillator: Secondary | ICD-10-CM | POA: Diagnosis not present

## 2017-03-07 NOTE — Progress Notes (Signed)
EPIC Encounter for ICM Monitoring  Patient Name: [name] is a 83 y.o. male Date: 03/07/2017 Primary Care Physican: Noralee Space, MD Primary Cardiologist: [place] Electrophysiologist: Faustino Congress Weight:unknown  Call back to daughter [name], [place]. Heart Failure questions reviewed, pt asymptomatic.  She stated he is doing well. Has occasional weight fluctuation of 2-3 pounds but loses it quickly.    Thoracic impedance normal.  Prescribed dosage: Furosemide 40 mg 1 tablet every other day and when taking Furosemide, take Potassium 20 mEq 1 tablet every other day.   Labs: 01/22/2017 Creatinine 0.94, BUN 19, Potassium 5.0, Sodium 134 01/14/2017 Creatinine 1.13, BUN 39, Potassium 3.7, Sodium 135, EGFR 58->60  01/13/2017 Creatinine 1.30, BUN 28, Potassium 5.1, Sodium 135, EGFR 49-57  11/13/2016 Creatinine 1.13, BUN 17, Potassium 4.6, Sodium 139 09/19/2017Creatinine 1.06, BUN 18, Potassium 4.6, Sodium 133  09/14/2017Creatinine 0.98, BUN 15, Potassium 4.9, Sodium 138  08/16/2017Creatinine 0.84, BUN 11, Potassium 4.8, Sodium 137  05/16/2016 Creatinine 1.07, BUN 16, Potassium 5.1, Sodium 127   Recommendations: No changes. Discussed to limit salt intake to 2000 mg/day and fluid intake to < 2 liters/day.  Encouraged to call for fluid symptoms or use local ER for any urgent symptoms.  Follow-up plan: ICM clinic phone appointment on 04/09/2017.  Office appointment scheduled on 04/01/2017 with Dr Percival Spanish.  Copy of ICM check sent to device physician.   3 month ICM trend: 03/07/2017   1 Year ICM trend:      Jesse Billings, RN 03/07/2017 11:21 AM

## 2017-03-14 ENCOUNTER — Telehealth: Payer: Self-pay | Admitting: Cardiology

## 2017-03-14 NOTE — Telephone Encounter (Signed)
Remote transmission reviewed. Presenting rhythm: ApVs w/(1) PVC. Stable lead measurements. No episodes recorded. Stable thoracic impedance.  Spoke to patient about "episode" from last night. Patient described experiencing a "short shock". I explained to patient that he had not had any episodes recorded on his device. I told him that it is not uncommon for someone who has been shocked before to experience "phantom shocks". Patient verbalized understanding and appreciation of call.

## 2017-03-14 NOTE — Telephone Encounter (Signed)
Patient called and stated that he received ICD therapy on Wednesday Night around 12:30 AM. He stated that he feels fine. Instructed patient to send a remote transmission with his home monitor. Once transmission is received a Chief Operating Officer will review and call back with results. Patient verbalized understanding.

## 2017-03-28 ENCOUNTER — Telehealth: Payer: Self-pay

## 2017-03-28 NOTE — Telephone Encounter (Signed)
Patient calls stating that he received a shock from his device last night. No transmission was received in carelink - patient will send a transmission for my review.

## 2017-03-28 NOTE — Telephone Encounter (Signed)
Received patient's transmission which showed no episodes. I explained this to patient. He verbalized understanding.
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
## 2017-04-01 ENCOUNTER — Ambulatory Visit (INDEPENDENT_AMBULATORY_CARE_PROVIDER_SITE_OTHER): Payer: Medicare Other | Admitting: Cardiology

## 2017-04-01 ENCOUNTER — Encounter: Payer: Self-pay | Admitting: Cardiology

## 2017-04-01 VITALS — BP 104/58 | HR 84 | Ht 71.0 in | Wt 175.6 lb

## 2017-04-01 DIAGNOSIS — I5042 Chronic combined systolic (congestive) and diastolic (congestive) heart failure: Secondary | ICD-10-CM | POA: Diagnosis not present

## 2017-04-01 DIAGNOSIS — R5383 Other fatigue: Secondary | ICD-10-CM | POA: Diagnosis not present

## 2017-04-01 DIAGNOSIS — I952 Hypotension due to drugs: Secondary | ICD-10-CM

## 2017-04-01 DIAGNOSIS — Z79899 Other long term (current) drug therapy: Secondary | ICD-10-CM

## 2017-04-01 DIAGNOSIS — I255 Ischemic cardiomyopathy: Secondary | ICD-10-CM

## 2017-04-01 DIAGNOSIS — I2510 Atherosclerotic heart disease of native coronary artery without angina pectoris: Secondary | ICD-10-CM | POA: Diagnosis not present

## 2017-04-01 LAB — COMPREHENSIVE METABOLIC PANEL
ALK PHOS: 93 IU/L (ref 39–117)
ALT: 15 IU/L (ref 0–44)
AST: 24 IU/L (ref 0–40)
Albumin/Globulin Ratio: 1.4 (ref 1.2–2.2)
Albumin: 4.1 g/dL (ref 3.5–4.7)
BILIRUBIN TOTAL: 0.4 mg/dL (ref 0.0–1.2)
BUN / CREAT RATIO: 23 (ref 10–24)
BUN: 24 mg/dL (ref 8–27)
CHLORIDE: 98 mmol/L (ref 96–106)
CO2: 25 mmol/L (ref 20–29)
Calcium: 9.3 mg/dL (ref 8.6–10.2)
Creatinine, Ser: 1.06 mg/dL (ref 0.76–1.27)
GFR calc non Af Amer: 64 mL/min/{1.73_m2} (ref >59)
GFR, EST AFRICAN AMERICAN: 74 mL/min/{1.73_m2} (ref >59)
GLOBULIN, TOTAL: 3 g/dL (ref 1.5–4.5)
Glucose: 77 mg/dL (ref 65–99)
Potassium: 4.7 mmol/L (ref 3.5–5.2)
SODIUM: 137 mmol/L (ref 134–144)
TOTAL PROTEIN: 7.1 g/dL (ref 6.0–8.5)

## 2017-04-01 LAB — TSH: TSH: 4.52 u[IU]/mL — AB (ref 0.450–4.500)

## 2017-04-01 NOTE — Patient Instructions (Addendum)
Medication Instructions:  Continue current medications  Labwork: CMP and TSH  Testing/Procedures: None Ordered  Follow-Up: Your physician wants you to follow-up in: 6 Months. You will receive a reminder letter in the mail two months in advance. If you don't receive a letter, please call our office to schedule the follow-up appointment.   Any Other Special Instructions Will Be Listed Below (If Applicable).   If you need a refill on your cardiac medications before your next appointment, please call your pharmacy.

## 2017-04-03 DIAGNOSIS — Z08 Encounter for follow-up examination after completed treatment for malignant neoplasm: Secondary | ICD-10-CM | POA: Diagnosis not present

## 2017-04-03 DIAGNOSIS — Z85828 Personal history of other malignant neoplasm of skin: Secondary | ICD-10-CM | POA: Diagnosis not present

## 2017-04-09 ENCOUNTER — Ambulatory Visit (INDEPENDENT_AMBULATORY_CARE_PROVIDER_SITE_OTHER): Payer: Medicare Other

## 2017-04-09 DIAGNOSIS — Z95810 Presence of automatic (implantable) cardiac defibrillator: Secondary | ICD-10-CM | POA: Diagnosis not present

## 2017-04-09 DIAGNOSIS — I5042 Chronic combined systolic (congestive) and diastolic (congestive) heart failure: Secondary | ICD-10-CM | POA: Diagnosis not present

## 2017-04-09 NOTE — Progress Notes (Signed)
EPIC Encounter for ICM Monitoring  Patient Name: [name] is a 83 y.o. male Date: 04/09/2017 Primary Care Physican: Noralee Space, MD Primary Cardiologist: Doctor Phillips Electrophysiologist: Faustino Congress Weight:unknown  Call to [name], [place].  Heart Failure questions reviewed, pt asymptomatic.   Thoracic impedance normal  Prescribed dosage: Furosemide 40 mg 1 tablet as needed for weight gain.  Potassium 20 mEq 1 tablet every day as needed (Take each time you take a Furosemide).  Labs: 01/22/2017 Creatinine 0.94, BUN 19, Potassium 5.0, Sodium 134 01/14/2017 Creatinine 1.13, BUN 39, Potassium 3.7, Sodium 135, EGFR 58->60  01/13/2017 Creatinine 1.30, BUN 28, Potassium 5.1, Sodium 135, EGFR 49-57  11/13/2016 Creatinine 1.13, BUN 17, Potassium 4.6, Sodium 139 09/19/2017Creatinine 1.06, BUN 18, Potassium 4.6, Sodium 133  09/14/2017Creatinine 0.98, BUN 15, Potassium 4.9, Sodium 138  08/16/2017Creatinine 0.84, BUN 11, Potassium 4.8, Sodium 137  05/16/2016 Creatinine 1.07, BUN 16, Potassium 5.1, Sodium 127   Recommendations: No changes.   Encouraged to call for fluid symptoms.  Follow-up plan: ICM clinic phone appointment on 05/13/2017.    Copy of ICM check sent to primary cardiologist and device physician.   3 month ICM trend: 04/09/2017   1 Year ICM trend:      [name], RN 04/09/2017 11:59 AM

## 2017-04-13 ENCOUNTER — Other Ambulatory Visit: Payer: Self-pay | Admitting: Pulmonary Disease

## 2017-04-27 ENCOUNTER — Other Ambulatory Visit: Payer: Self-pay | Admitting: Pulmonary Disease

## 2017-05-11 ENCOUNTER — Other Ambulatory Visit: Payer: Self-pay | Admitting: Pulmonary Disease

## 2017-05-13 ENCOUNTER — Ambulatory Visit (INDEPENDENT_AMBULATORY_CARE_PROVIDER_SITE_OTHER): Payer: Medicare Other | Admitting: *Deleted

## 2017-05-13 ENCOUNTER — Telehealth: Payer: Self-pay

## 2017-05-13 DIAGNOSIS — I255 Ischemic cardiomyopathy: Secondary | ICD-10-CM | POA: Diagnosis not present

## 2017-05-13 DIAGNOSIS — Z95810 Presence of automatic (implantable) cardiac defibrillator: Secondary | ICD-10-CM | POA: Diagnosis not present

## 2017-05-13 DIAGNOSIS — I5042 Chronic combined systolic (congestive) and diastolic (congestive) heart failure: Secondary | ICD-10-CM

## 2017-05-13 NOTE — Progress Notes (Signed)
ICD remote transmission received 

## 2017-05-13 NOTE — Telephone Encounter (Signed)
Remote ICM transmission received.  Attempted patient call and left detailed message regarding transmission and next ICM scheduled for 06/13/2017.  Advised to return call for any fluid symptoms or questions.

## 2017-05-13 NOTE — Progress Notes (Signed)
EPIC Encounter for ICM Monitoring  Patient Name: [name] is a 83 y.o. male Date: 05/13/2017 Primary Care Physican: Noralee Space, MD Primary Cardiologist: [place] Electrophysiologist: Faustino Congress Weight:unknown      Attempted call to [name], [place] and left detailed message regarding transmission.  Transmission reviewed.   Thoracic impedance normal.  Prescribed dosage: Furosemide 40 mg 1 tablet as needed for weight gain.  Potassium 20 mEq 1 tablet every day as needed (Take each time you take a Furosemide).  Labs: 01/22/2017 Creatinine 0.94, BUN 19, Potassium 5.0, Sodium 134 01/14/2017 Creatinine 1.13, BUN 39, Potassium 3.7, Sodium 135, EGFR 58->60  01/13/2017 Creatinine 1.30, BUN 28, Potassium 5.1, Sodium 135, EGFR 49-57  11/13/2016 Creatinine 1.13, BUN 17, Potassium 4.6, Sodium 139 09/19/2017Creatinine 1.06, BUN 18, Potassium 4.6, Sodium 133  09/14/2017Creatinine 0.98, BUN 15, Potassium 4.9, Sodium 138  08/16/2017Creatinine 0.84, BUN 11, Potassium 4.8, Sodium 137  05/16/2016 Creatinine 1.07, BUN 16, Potassium 5.1, Sodium 127   Recommendations: Left voice mail with ICM number and encouraged to call for fluid symptoms.  Follow-up plan: ICM clinic phone appointment on 06/13/2017.   Copy of ICM check sent to device physician.   3 month ICM trend: 05/13/2017   1 Year ICM trend:      [name], RN 05/13/2017 11:43 AM

## 2017-05-15 ENCOUNTER — Ambulatory Visit: Payer: Medicare Other | Admitting: Pulmonary Disease

## 2017-05-16 ENCOUNTER — Encounter: Payer: Self-pay | Admitting: Cardiology

## 2017-05-20 ENCOUNTER — Encounter: Payer: Self-pay | Admitting: Pulmonary Disease

## 2017-05-20 ENCOUNTER — Ambulatory Visit (INDEPENDENT_AMBULATORY_CARE_PROVIDER_SITE_OTHER): Payer: Medicare Other | Admitting: Pulmonary Disease

## 2017-05-20 ENCOUNTER — Other Ambulatory Visit: Payer: Self-pay | Admitting: Pulmonary Disease

## 2017-05-20 VITALS — BP 122/78 | HR 78 | Temp 97.6°F | Ht 71.0 in | Wt 178.4 lb

## 2017-05-20 DIAGNOSIS — I2510 Atherosclerotic heart disease of native coronary artery without angina pectoris: Secondary | ICD-10-CM | POA: Diagnosis not present

## 2017-05-20 DIAGNOSIS — I272 Pulmonary hypertension, unspecified: Secondary | ICD-10-CM

## 2017-05-20 DIAGNOSIS — Z9861 Coronary angioplasty status: Secondary | ICD-10-CM | POA: Diagnosis not present

## 2017-05-20 DIAGNOSIS — I5042 Chronic combined systolic (congestive) and diastolic (congestive) heart failure: Secondary | ICD-10-CM

## 2017-05-20 DIAGNOSIS — I255 Ischemic cardiomyopathy: Secondary | ICD-10-CM | POA: Diagnosis not present

## 2017-05-20 DIAGNOSIS — Z95810 Presence of automatic (implantable) cardiac defibrillator: Secondary | ICD-10-CM | POA: Diagnosis not present

## 2017-05-20 DIAGNOSIS — I10 Essential (primary) hypertension: Secondary | ICD-10-CM

## 2017-05-20 DIAGNOSIS — J449 Chronic obstructive pulmonary disease, unspecified: Secondary | ICD-10-CM

## 2017-05-20 NOTE — Patient Instructions (Signed)
Today we updated your med list in our EPIC system...    Continue your current medications the same...  [facility]...  Call for any questions or new symptoms...  Let's plan a follow up visit in 3-23mo, sooner if needed for problems.Marland KitchenMarland Kitchen
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
## 2017-05-21 ENCOUNTER — Other Ambulatory Visit: Payer: Self-pay

## 2017-05-21 ENCOUNTER — Telehealth: Payer: Self-pay | Admitting: *Deleted

## 2017-05-21 ENCOUNTER — Encounter: Payer: Self-pay | Admitting: Family

## 2017-05-21 DIAGNOSIS — M542 Cervicalgia: Secondary | ICD-10-CM

## 2017-05-21 DIAGNOSIS — I779 Disorder of arteries and arterioles, unspecified: Secondary | ICD-10-CM

## 2017-05-21 NOTE — Telephone Encounter (Signed)
[name], patients daughter called stating that patient had called earlier complaining that during the night he had had a headache and complained of his neck hurting as well.  He had an appointment yesterday with his PCP and all had been ok, according to [place].  His annual follow up is scheduled for September.  She was inquiring if the appointment could possibly be moved to a sooner date.  A carotid duplex was scheduled for 05/23/2017 with seeing the NP following.  Patient is to report to the ED if symptoms should worsen.

## 2017-05-23 ENCOUNTER — Ambulatory Visit (INDEPENDENT_AMBULATORY_CARE_PROVIDER_SITE_OTHER): Payer: Medicare Other | Admitting: Family

## 2017-05-23 ENCOUNTER — Ambulatory Visit (HOSPITAL_COMMUNITY)
Admission: RE | Admit: 2017-05-23 | Discharge: 2017-05-23 | Disposition: A | Discharge status: Home / Self Care | Payer: Medicare Other | Source: Ambulatory Visit | Attending: Family | Admitting: Family

## 2017-05-23 ENCOUNTER — Encounter: Payer: Self-pay | Admitting: Family

## 2017-05-23 DIAGNOSIS — Z9889 Other specified postprocedural states: Secondary | ICD-10-CM | POA: Diagnosis not present

## 2017-05-23 DIAGNOSIS — I779 Disorder of arteries and arterioles, unspecified: Secondary | ICD-10-CM

## 2017-05-23 DIAGNOSIS — R519 Headache, unspecified: Secondary | ICD-10-CM

## 2017-05-23 DIAGNOSIS — M542 Cervicalgia: Secondary | ICD-10-CM | POA: Diagnosis not present

## 2017-05-23 DIAGNOSIS — I255 Ischemic cardiomyopathy: Secondary | ICD-10-CM | POA: Diagnosis not present

## 2017-05-23 DIAGNOSIS — Z87891 Personal history of nicotine dependence: Secondary | ICD-10-CM

## 2017-05-23 DIAGNOSIS — I6523 Occlusion and stenosis of bilateral carotid arteries: Secondary | ICD-10-CM | POA: Diagnosis not present

## 2017-05-23 DIAGNOSIS — I6521 Occlusion and stenosis of right carotid artery: Secondary | ICD-10-CM | POA: Diagnosis not present

## 2017-05-23 DIAGNOSIS — R51 Headache: Secondary | ICD-10-CM

## 2017-05-23 LAB — VAS US CAROTID
LCCADSYS: 57 cm/s
LCCAPDIAS: 12 cm/s
LCCAPSYS: 67 cm/s
LEFT ECA DIAS: -20 cm/s
Left CCA dist dias: 16 cm/s
Left ICA dist dias: -22 cm/s
Left ICA dist sys: -82 cm/s
Left ICA prox dias: -35 cm/s
Left ICA prox sys: -124 cm/s
RCCAPDIAS: 13 cm/s
RIGHT CCA MID DIAS: 4 cm/s
RIGHT ECA DIAS: -8 cm/s
Right CCA prox sys: 63 cm/s
Right cca dist sys: -81 cm/s

## 2017-05-23 NOTE — Progress Notes (Signed)
Chief Complaint: Left side headache and neck ache x 1 week   History of Present Illness  [name] is a 83 y.o. male who is s/p right CEA in March of 2012 by Dr. Scot Dock.  He returns today with c/o pain above and behind left ear, and left side of neck, that started about a week ago, is constant, dull, and is getting worse. He denies any recent hemiparesis, amaurosis, vision changes or speech difficulties. He states that he had cancer removed from this area about 2014 or 2015.   He states skin flaking on the left side of his head, neck, and forehead also started about a week ago; states he has an appointment on a week to see a dermatologist about a raised lesion on the upper anterior tibial area of his left lower leg.  He states he has not received the shingles vaccine.    Patient has no remote history of TIA or stroke symptoms. Specifically he denies a history of amaurosis fugax or monocular blindness, unilateral facial drooping, hemiplegia, or receptive or expressive aphasia.   He reports that he cannot remember things like he used to. He denies claudication symptoms in legs with walking, denies non healing wounds. He is caregiver for his wife.  Pt Diabetic: No Pt smoker: former smoker, quit cigars in 2013, quit cigarettes several years prior to that  Pt meds include: Statin : yes ASA: no Other anticoagulants/antiplatelets: Plavix   Past Medical History:  Diagnosis Date  . AAA (abdominal aortic aneurysm) ([place])    a. 2002 s/p repair.  . Abnormal liver function tests   . AICD (automatic cardioverter/defibrillator) present   . Allergic rhinitis   . Atherosclerotic heart disease   . Back pain   . CAD (coronary artery disease)    a. 02/2002 H/o MI with stenting x 2; b. 04/2013 MV: EF 25%, large posterior lateral infarct w/o ischemia; c. 03/2016 VT Arrest/Cath: LM 60ost, LAD 40p/m ISR, LCX 100p/m, RCA nl-->Med Rx.  . Carotid arterial disease ([place])    a. 12/2010 s/p R CEA;   b. 05/2015 Carotid U/S: bilat <40% ICA stenosis.  . Chronic combined systolic and diastolic CHF (congestive heart failure) ([place])    a. 03/2016 Echo: Ef 35-40%, grade 1 DD.  Marland Kitchen Compression fracture   . COPD (chronic obstructive pulmonary disease) ([place])   . Coronary artery disease   . Dermatitis   . Diverticulosis of colon   . DJD (degenerative joint disease)    and Gout  . Hemorrhoids   . History of colonic polyps   . History of gout   . History of pneumonia   . Hypercholesterolemia   . Hypertension   . Hypertensive heart disease   . Ischemic cardiomyopathy    a. EF prev <35%-->improved to normal by Echo 8/14:  Mild LVH, focal basal hypertrophy, EF 60-65%, normal wall motion, mild BAE, PASP 36; c. 03/2016 Echo: EF 35-40%, Gr1 DD, triv AI, mild MR, mod dil LA, mild-mod TR, PASP 109mmHg.  . Osteoporosis   . Peripheral vascular disease ([place])   . Presence of cardiac defibrillator    a. 03/2008 s/p MDT [id number] Maximo II DR, DC AICD; b. 03/2013: ICD shock for T wave oversensing;  c. 10/2015: collective decision not to replace ICD given improvement in LV fxn; d. VT Arrest-->Gen change to MDT ser # [id number].  . Skin cancer    shoulders and forehead  . Syncope   . T wave over sensing resulting in inappropriate  shocks    a. 03/2013.  . Tobacco abuse   . Ventricular tachycardia ([place]) 04/01/2016    Social History Social History  Substance Use Topics  . Smoking status: Former Smoker    Types: Cigarettes, Cigars    Quit date: 10/22/2012  . Smokeless tobacco: Never Used     Comment: quit about 10 years ago  . Alcohol use 0.0 oz/week    3 Glasses of wine per week     Comment: weekly    Family History Family History  Problem Relation Age of Onset  . Hypertension Father   . Heart disease Father        Heart Disease before age 11  . Hypertension Mother   . Heart disease Mother        Heart Disease before age 1  . Cancer Mother     Surgical History Past Surgical History:  Procedure  Laterality Date  . ABDOMINAL AORTIC ANEURYSM REPAIR  2002   by Dr. Kellie Simmering  . aicd placed  03/2008   Dr. Caryl Comes  . cad stent  02/2002   Dr. Percival Spanish  . CARDIAC CATHETERIZATION     X 2 stents  . CARDIAC CATHETERIZATION N/A 04/03/2016   Procedure: Left Heart Cath and Coronary Angiography;  Surgeon: Belva Crome, MD;  Location: [place] [facility];  Service: Cardiovascular;  Laterality: N/A;  . CARDIAC DEFIBRILLATOR PLACEMENT  2009  . CARDIAC DEFIBRILLATOR PLACEMENT    . CAROTID ENDARTERECTOMY Right January 02, 2011  . EAR CYST EXCISION Left 12/13/2015   Procedure: Excision left ear lesion ;  Surgeon: Melissa Montane, MD;  Location: [place];  Service: ENT;  Laterality: Left;  . EP IMPLANTABLE DEVICE N/A 04/06/2016   Procedure:  ICD Generator Changeout;  Surgeon: Deboraha Sprang, MD;  Location: [place] [facility];  Service: Cardiovascular;  Laterality: N/A;  . EXCISION OF LESION LEFT EAR Left 12/13/2015  . I&D EXTREMITY Left 04/23/2016   Procedure: IRRIGATION AND DEBRIDEMENT HAND;  Surgeon: Leandrew Koyanagi, MD;  Location: [place];  Service: Orthopedics;  Laterality: Left;  . OPEN REDUCTION INTERNAL FIXATION (ORIF) METACARPAL Left 04/04/2016   Procedure: OPEN REDUCTION INTERNAL FIXATION (ORIF) LEFT 2ND, 3RD, 4TH METACARPAL FRACTURE;  Surgeon: Leandrew Koyanagi, MD;  Location: [place];  Service: Orthopedics;  Laterality: Left;  OPEN REDUCTION INTERNAL FIXATION (ORIF) LEFT 2ND, 3RD, 4TH METACARPAL FRACTURE  . OPEN REDUCTION INTERNAL FIXATION (ORIF) METACARPAL Left 04/23/2016   Procedure: REVISION OPEN REDUCTION INTERNAL FIXATION (ORIF) 2ND METACARPAL;  Surgeon: Leandrew Koyanagi, MD;  Location: [place];  Service: Orthopedics;  Laterality: Left;  . right corotid enderectomy  12/2010   Dr. Scot Dock  . SKIN SPLIT GRAFT Left 12/13/2015   Procedure: with possible skin graft;  Surgeon: Melissa Montane, MD;  Location: [place];  Service: ENT;  Laterality: Left;  . TONSILLECTOMY      Allergies  Allergen Reactions  . Lisinopril     BP dropped too  low per daughter    Current Outpatient Prescriptions  Medication Sig Dispense Refill  . ADVAIR DISKUS 250-50 MCG/DOSE AEPB INHALE 1 PUFF TWICE DAILY. 60 each 5  . albuterol (PROVENTIL HFA;VENTOLIN HFA) 108 (90 Base) MCG/ACT inhaler Inhale 2 puffs into the lungs every 6 (six) hours as needed for wheezing or shortness of breath. 1 Inhaler 2  . allopurinol (ZYLOPRIM) 100 MG tablet TAKE 1 TABLET ONCE DAILY. 30 tablet 2  . amiodarone (PACERONE) 200 MG tablet TAKE 1 TABLET ONCE DAILY. 30 tablet 5  .  carvedilol (COREG) 3.125 MG tablet Take 1 tablet (3.125 mg total) by mouth 2 (two) times daily with a meal. 60 tablet 11  . cholecalciferol (VITAMIN D) 1000 units tablet Take 1,000 Units by mouth daily.    . clopidogrel (PLAVIX) 75 MG tablet TAKE 1 TABLET ONCE DAILY. 30 tablet 5  . furosemide (LASIX) 40 MG tablet TAKE 1 TABLET DAILY AS NEEDED. (FOR WEIGHT GAIN) 30 tablet 0  . guaiFENesin (MUCINEX) 600 MG 12 hr tablet Take 600 mg by mouth 2 (two) times daily.    Marland Kitchen levothyroxine (SYNTHROID, LEVOTHROID) 25 MCG tablet TAKE 1 TABLET IN THE MORNING ON AN EMPTY STOMACH. 30 tablet 0  . magnesium oxide (MAG-OX) 400 (241.3 Mg) MG tablet Take 0.5 tablets (200 mg total) by mouth daily. 30 tablet 0  . Multiple Vitamin (MULTIVITAMIN) capsule Take 1 capsule by mouth daily.    . Multiple Vitamins-Minerals (PRESERVISION/LUTEIN) CAPS Take 1 capsule by mouth daily.     Marland Kitchen oxybutynin (DITROPAN) 5 MG tablet TAKE 1 TABLET TWICE DAILY. 60 tablet 2  . polyethylene glycol powder (GLYCOLAX/MIRALAX) powder DISSOLVE ONE CAPFUL IN 8 0Z. WATER TWICE DAILY. 527 g 0  . Potassium Chloride ER 20 MEQ TBCR Take 20 mEq by mouth every other day. Take each time you take Lasix (Furosemide) 45 tablet 3  . potassium chloride SA (K-DUR,KLOR-CON) 20 MEQ tablet TAKE 1 TABLET DAILY AS NEEDED. (TAKE EACH TIME YOU TAKE FUROSEMIDE) 30 tablet 6  . pravastatin (PRAVACHOL) 40 MG tablet TAKE 1 TABLET ONCE DAILY. 90 tablet 0  . sertraline (ZOLOFT) 50 MG  tablet TAKE ONE TABLET AT BEDTIME. 30 tablet 2  . thiamine 100 MG tablet Take 1 tablet (100 mg total) by mouth daily. 30 tablet 0   No current facility-administered medications for this visit.     Review of Systems : See HPI for pertinent positives and negatives.  Physical Examination  Vitals:   05/23/17 0842 05/23/17 0843  BP: 134/85 (!) 141/79  Pulse: 84   Resp: 18   Temp: (!) 97.1 F (36.2 C)   TempSrc: Oral   SpO2: 96%   Weight: 181 lb (82.1 kg)   Height: 5\' 11"  (1.803 m)    Body mass index is 25.24 kg/m.  General: WDWN male in NAD GAIT:normal Eyes: PERRLA Pulmonary: Respirations are non-labored, CTAB, good air movement in all fields.   Cardiac: regular rhythm, no detected murmur, pacemaker/defibrillator palpated subcutaneously left upper chest.  VASCULAR EXAM Carotid Bruits Right Left   Negative Negative    Radial pulses are 2+2 palpable and equal.     Gastrointestinal: soft, nontender, BS WNL, no r/g,no palpable masses.  Musculoskeletal: No muscle atrophy/wasting. M/S 5/5 throughout, Extremities without ischemic changes.        Skin:  Significant flaking of skin on left side of scalp, face, neck, and forehead; also some shallow open lesions clustered at left side of neck and scalp.   Neurologic: A&O X 3; appropriate affect; sensation is normal, speech is normal, CN 2-12 intact, pain and light touch intact in extremities, motor exam as listed above.     Assessment: DECLAN MIER is a 83 y.o. male  who is s/p right CEA in March of 2012. He has no history of stroke or TIA.  Dull pain behind and above left ear that started a week ago, is constant and worsening, has significant flaking of skin on left side of scalp, face, neck, and forehead; also some shallow open lesions clustered at left side of  neck and scalp.  He had  seborrheic dermatitis on his upper face when I evaluated him on 06-26-16; the prolific flaking of skin on the left side of his face, neck, and scalp, and the shallow open lesions in these areas, are new.  I advised pt to contact his PCP and dermatologist today to seek advice re this. He has not had the Herpes Zoster vaccination as far as he knows.  He states his left eye does not feel affected.  He also has a hx of cancer in the affected area.   DATA Carotid Duplex (05/23/17): Right ICA: CEA site with 1-39% stenosis. Left ICA: 40-59% stenosis. Bilateral vertebral artery flow is antegrade.  Bilateral subclavian artery waveforms are normal.  Right ICA remains stable, increased stenosis in left ICA compared to the last exam on 06-13-16.    Plan: Follow-up in 1 year with Carotid Duplex scan, cancel appointment for September 2018.    I discussed in depth with the patient the nature of atherosclerosis, and emphasized the importance of maximal medical management including strict control of blood pressure, blood glucose, and lipid levels, obtaining regular exercise, and continued cessation of smoking.  The patient is aware that without maximal medical management the underlying atherosclerotic disease process will progress, limiting the benefit of any interventions. The patient was given information about stroke prevention and what symptoms should prompt the patient to seek immediate medical care. Thank you for allowing [place] to participate in this patient's care.  Clemon Chambers, RN, MSN, FNP-C [facility] Office: [phone]  Clinic Physician: Oneida Alar  05/23/17 8:47 AM

## 2017-05-23 NOTE — Patient Instructions (Signed)
Stroke Prevention Some medical conditions and behaviors are associated with an increased chance of having a stroke. You may prevent a stroke by making healthy choices and managing medical conditions. How can I reduce my risk of having a stroke?  Stay physically active. Get at least 30 minutes of activity on most or all days.  Do not smoke. It may also be helpful to avoid exposure to secondhand smoke.  Limit alcohol use. Moderate alcohol use is considered to be:  No more than 2 drinks per day for men.  No more than 1 drink per day for nonpregnant women.  Eat healthy foods. This involves:  Eating 5 or more servings of fruits and vegetables a day.  Making dietary changes that address high blood pressure (hypertension), high cholesterol, diabetes, or obesity.  Manage your cholesterol levels.  Making food choices that are high in fiber and low in saturated fat, trans fat, and cholesterol may control cholesterol levels.  Take any prescribed medicines to control cholesterol as directed by your health care provider.  Manage your diabetes.  Controlling your carbohydrate and sugar intake is recommended to manage diabetes.  Take any prescribed medicines to control diabetes as directed by your health care provider.  Control your hypertension.  Making food choices that are low in salt (sodium), saturated fat, trans fat, and cholesterol is recommended to manage hypertension.  Ask your health care provider if you need treatment to lower your blood pressure. Take any prescribed medicines to control hypertension as directed by your health care provider.  If you are 18-39 years of age, have your blood pressure checked every 3-5 years. If you are 40 years of age or older, have your blood pressure checked every year.  Maintain a healthy weight.  Reducing calorie intake and making food choices that are low in sodium, saturated fat, trans fat, and cholesterol are recommended to manage  weight.  Stop drug abuse.  Avoid taking birth control pills.  Talk to your health care provider about the risks of taking birth control pills if you are over 35 years old, smoke, get migraines, or have ever had a blood clot.  Get evaluated for sleep disorders (sleep apnea).  Talk to your health care provider about getting a sleep evaluation if you snore a lot or have excessive sleepiness.  Take medicines only as directed by your health care provider.  For some people, aspirin or blood thinners (anticoagulants) are helpful in reducing the risk of forming abnormal blood clots that can lead to stroke. If you have the irregular heart rhythm of atrial fibrillation, you should be on a blood thinner unless there is a good reason you cannot take them.  Understand all your medicine instructions.  Make sure that other conditions (such as anemia or atherosclerosis) are addressed. Get help right away if:  You have sudden weakness or numbness of the face, arm, or leg, especially on one side of the body.  Your face or eyelid droops to one side.  You have sudden confusion.  You have trouble speaking (aphasia) or understanding.  You have sudden trouble seeing in one or both eyes.  You have sudden trouble walking.  You have dizziness.  You have a loss of balance or coordination.  You have a sudden, severe headache with no known cause.  You have new chest pain or an irregular heartbeat. Any of these symptoms may represent a serious problem that is an emergency. Do not wait to see if the symptoms will go away.   Get medical help at once. Call your local emergency services (911 in [place] Do not drive yourself to the hospital. This information is not intended to replace advice given to you by your health care provider. Make sure you discuss any questions you have with your health care provider. Document Released: 11/15/2004 Document Revised: 03/15/2016 Document Reviewed: 04/10/2013 Elsevier  Interactive Patient Education  2017 [employer]  

## 2017-05-24 DIAGNOSIS — L0100 Impetigo, unspecified: Secondary | ICD-10-CM | POA: Diagnosis not present

## 2017-05-24 DIAGNOSIS — B0229 Other postherpetic nervous system involvement: Secondary | ICD-10-CM | POA: Diagnosis not present

## 2017-05-25 ENCOUNTER — Other Ambulatory Visit: Payer: Self-pay | Admitting: Pulmonary Disease

## 2017-05-27 ENCOUNTER — Encounter: Payer: Self-pay | Admitting: Pulmonary Disease

## 2017-05-27 ENCOUNTER — Ambulatory Visit (INDEPENDENT_AMBULATORY_CARE_PROVIDER_SITE_OTHER): Payer: Medicare Other | Admitting: Pulmonary Disease

## 2017-05-27 VITALS — BP 120/70 | HR 78 | Temp 97.8°F

## 2017-05-27 DIAGNOSIS — I255 Ischemic cardiomyopathy: Secondary | ICD-10-CM | POA: Diagnosis not present

## 2017-05-27 DIAGNOSIS — C44219 Basal cell carcinoma of skin of left ear and external auricular canal: Secondary | ICD-10-CM

## 2017-05-27 DIAGNOSIS — B029 Zoster without complications: Secondary | ICD-10-CM | POA: Diagnosis not present

## 2017-05-27 DIAGNOSIS — I2510 Atherosclerotic heart disease of native coronary artery without angina pectoris: Secondary | ICD-10-CM

## 2017-05-27 DIAGNOSIS — Z95810 Presence of automatic (implantable) cardiac defibrillator: Secondary | ICD-10-CM

## 2017-05-27 DIAGNOSIS — I10 Essential (primary) hypertension: Secondary | ICD-10-CM

## 2017-05-27 DIAGNOSIS — J449 Chronic obstructive pulmonary disease, unspecified: Secondary | ICD-10-CM | POA: Diagnosis not present

## 2017-05-27 DIAGNOSIS — I5042 Chronic combined systolic (congestive) and diastolic (congestive) heart failure: Secondary | ICD-10-CM

## 2017-05-27 DIAGNOSIS — Z9861 Coronary angioplasty status: Secondary | ICD-10-CM | POA: Diagnosis not present

## 2017-05-27 DIAGNOSIS — L219 Seborrheic dermatitis, unspecified: Secondary | ICD-10-CM | POA: Insufficient documentation

## 2017-05-27 DIAGNOSIS — I272 Pulmonary hypertension, unspecified: Secondary | ICD-10-CM | POA: Diagnosis not present

## 2017-05-27 MED ORDER — METHYLPREDNISOLONE ACETATE 80 MG/ML IJ SUSP
80.0000 mg | Freq: Once | INTRAMUSCULAR | Status: AC
  Administered 2017-05-27: 80 mg via INTRAMUSCULAR
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
## 2017-05-27 NOTE — Patient Instructions (Signed)
Today we updated your med list in our EPIC system...    Continue your current medications the same...  Continue the Keflex twice daily & the Valtrex 3 times daily (from your dermatologist) until they are gone...  We gave you a Depo shot & a new Rx for a PREDNISONE dosepak to reduce the inflamm...    Take the dosepak as directed...  For the skin lesions on the left side of your head & neck >>    Get in the shower twice daily & gently wash your head & neck (use a clean washcloth to gently remove some of the scaley/ dead skin cells... Pat the areas dry w/ a clean towel...  You may use the topical medication provided by [facility] if the areas are painful, and you may use OTC Tylenol whenever needed (let me know if you need anything stronger).  This should be checked again in 1-2 weeks by myself or Dermatologist..Marland Kitchen

## 2017-05-30 ENCOUNTER — Encounter: Payer: Self-pay | Admitting: Cardiology

## 2017-06-01 ENCOUNTER — Other Ambulatory Visit: Payer: Self-pay | Admitting: Pulmonary Disease

## 2017-06-01 LAB — CUP PACEART REMOTE DEVICE CHECK
Brady Statistic AP VP Percent: 0.08 %
Brady Statistic AS VP Percent: 0.01 %
Brady Statistic RA Percent Paced: 98.72 %
Brady Statistic RV Percent Paced: 0.09 %
Date Time Interrogation Session: 20180723073523
HIGH POWER IMPEDANCE MEASURED VALUE: 42 Ohm
HighPow Impedance: 57 Ohm
Implantable Lead Implant Date: 20090623
Implantable Lead Implant Date: 20090623
Implantable Lead Model: 5076
Implantable Lead Model: 6947
Lead Channel Impedance Value: 304 Ohm
Lead Channel Pacing Threshold Amplitude: 0.75 V
Lead Channel Sensing Intrinsic Amplitude: 1.375 mV
Lead Channel Sensing Intrinsic Amplitude: 7.875 mV
Lead Channel Sensing Intrinsic Amplitude: 7.875 mV
Lead Channel Setting Pacing Amplitude: 2.5 V
Lead Channel Setting Pacing Pulse Width: 0.4 ms
Lead Channel Setting Sensing Sensitivity: 0.6 mV
MDC IDC LEAD LOCATION: 753859
MDC IDC LEAD LOCATION: 753860
MDC IDC MSMT BATTERY REMAINING LONGEVITY: 108 mo
MDC IDC MSMT BATTERY VOLTAGE: 3.01 V
MDC IDC MSMT LEADCHNL RA IMPEDANCE VALUE: 418 Ohm
MDC IDC MSMT LEADCHNL RA PACING THRESHOLD AMPLITUDE: 0.875 V
MDC IDC MSMT LEADCHNL RA PACING THRESHOLD PULSEWIDTH: 0.4 ms
MDC IDC MSMT LEADCHNL RA SENSING INTR AMPL: 1.375 mV
MDC IDC MSMT LEADCHNL RV IMPEDANCE VALUE: 361 Ohm
MDC IDC MSMT LEADCHNL RV PACING THRESHOLD PULSEWIDTH: 0.4 ms
MDC IDC PG IMPLANT DT: 20170616
MDC IDC SET LEADCHNL RA PACING AMPLITUDE: 2 V
MDC IDC STAT BRADY AP VS PERCENT: 98.66 %
MDC IDC STAT BRADY AS VS PERCENT: 1.24 %

## 2017-06-06 ENCOUNTER — Other Ambulatory Visit: Payer: Self-pay | Admitting: Pulmonary Disease

## 2017-06-08 ENCOUNTER — Other Ambulatory Visit: Payer: Self-pay | Admitting: Nurse Practitioner

## 2017-06-10 NOTE — Telephone Encounter (Signed)
Rx(s) sent to pharmacy electronically.  

## 2017-06-12 ENCOUNTER — Encounter: Payer: Self-pay | Admitting: Pulmonary Disease

## 2017-06-12 ENCOUNTER — Telehealth: Payer: Self-pay | Admitting: Pulmonary Disease

## 2017-06-12 NOTE — Telephone Encounter (Signed)
Given the symptoms that pt's daughter [name] mentions in the e-mail, would recommend a phone conversation so that this can be triaged appropriately.  We do have Cindy listed in pt's chart but her number is listed as a [id number] area code/number.  Would like to verify this number.  E-mail sent to [facility] regarding this and requesting her to verify her contact number.

## 2017-06-12 NOTE — Telephone Encounter (Signed)
Received the following message from patient's daughter [name].   From: Fonnie Jarvis   Sent: 06/12/2017 10:08 AM EDT    To: Noralee Space, MD  Subject: Visit Follow-Up Question    Hi -- This is Severino's daughter, [name]. The effects of the shingles are getting worse and Dad is describing "excruciating" pain. Is there any pain med that might help that can be called in?   Also, seems like he's really struggling with finding words lately and appears to be declining. Is this common when elderly people have shingles?   Thank you.  Marshell Levan   I called and spoke with [name] on the phone. She said that her father currently has Shingles and is in severe pain. The pain episodes come and go and last for about 3-5 minutes. He is currently not taking anything for the pain, but would like something to be called into the pharmacy.   He wishes to use [facility].   Maudry Mayhew that I would send this message to Dr. Lenna Gilford.   SN, please advise. Thanks!

## 2017-06-13 ENCOUNTER — Telehealth: Payer: Self-pay | Admitting: Cardiology

## 2017-06-13 ENCOUNTER — Ambulatory Visit (INDEPENDENT_AMBULATORY_CARE_PROVIDER_SITE_OTHER): Payer: Medicare Other

## 2017-06-13 DIAGNOSIS — Z95810 Presence of automatic (implantable) cardiac defibrillator: Secondary | ICD-10-CM

## 2017-06-13 DIAGNOSIS — I5042 Chronic combined systolic (congestive) and diastolic (congestive) heart failure: Secondary | ICD-10-CM | POA: Diagnosis not present

## 2017-06-13 NOTE — Telephone Encounter (Signed)
Per SN---  SN stated that he is disappointed that [place] didn't call him to let him know of his pain that he has been having.  Dr. Lenna Gilford was surprised to hear of the pain since when Antonie was here on 8/6 he told us that he did not have any pain at all.   1.  Dr. Lenna Gilford told Meiko to follow up with [place] in 1 week from his las appt or he could follow up with Dr Hall---but he will need to see SN next week---and he will will need to Dr. Nevada Crane to check the rash if it is getting worse.  Will need this appt ASAP.   2.  For the pain, we can call in :  Tramadol 50 mg  #90  1 po TID prn pain ( take this with 2 regular strength tylenol)

## 2017-06-13 NOTE — Progress Notes (Signed)
EPIC Encounter for ICM Monitoring  Patient Name: [name] is a 83 y.o. male Date: 06/13/2017 Primary Care Physican: Noralee Space, MD Primary Cardiologist: [place] Electrophysiologist: Faustino Congress Weight:unknown       Call to [name], [place].  Heart Failure questions reviewed, pt asymptomatic.   Thoracic impedance normal.  Prescribed dosage: Furosemide 40 mg 1 tablet as needed for weight gain.  Potassium 20 mEq 1 tablet every day as needed (Take each time you take a Furosemide).  Labs: 04/01/2017 Creatinine 0.94, BUN 19, Potassium 5.0, Sodium 134, [id number] 01/22/2017 Creatinine 0.94, BUN 19, Potassium 5.0, Sodium 134 01/14/2017 Creatinine 1.13, BUN 39, Potassium 3.7, Sodium 135, EGFR 58->60  01/13/2017 Creatinine 1.30, BUN 28, Potassium 5.1, Sodium 135, EGFR 49-57  11/13/2016 Creatinine 1.13, BUN 17, Potassium 4.6, Sodium 139 09/19/2017Creatinine 1.06, BUN 18, Potassium 4.6, Sodium 133  09/14/2017Creatinine 0.98, BUN 15, Potassium 4.9, Sodium 138  08/16/2017Creatinine 0.84, BUN 11, Potassium 4.8, Sodium 137  05/16/2016 Creatinine 1.07, BUN 16, Potassium 5.1, Sodium 127   Recommendations: No changes.   Encouraged to call for fluid symptoms.  Follow-up plan: ICM clinic phone appointment on 07/15/2017.   Copy of ICM check sent to Dr. Caryl Comes.   3 month ICM trend: 06/13/2017   1 Year ICM trend:      [name], RN 06/13/2017 1:17 PM

## 2017-06-13 NOTE — Telephone Encounter (Signed)
Left message for [name] to call back.

## 2017-06-13 NOTE — Telephone Encounter (Signed)
Spoke with pt and reminded pt of remote transmission that is due today. Pt verbalized understanding.   

## 2017-06-14 DIAGNOSIS — B029 Zoster without complications: Secondary | ICD-10-CM | POA: Diagnosis not present

## 2017-06-14 DIAGNOSIS — C44729 Squamous cell carcinoma of skin of left lower limb, including hip: Secondary | ICD-10-CM | POA: Diagnosis not present

## 2017-06-14 MED ORDER — TRAMADOL HCL 50 MG PO TABS: 50.0000 mg | ORAL_TABLET | Freq: Three times a day (TID) | ORAL | 0 refills | Status: DC | PRN

## 2017-06-14 NOTE — Telephone Encounter (Signed)
Spoke with patient's daughter [name] yesterday but Rx did not go through [facility] and gave Rx verbally to pharmacist Maudie Mercury E-mail sent to [place] for the inconvenience Will sign off

## 2017-06-14 NOTE — Telephone Encounter (Signed)
Spoke with pt's daughter [name] and discussed SN's recommendations yesterday 8.23.18 [name] is aware of the Rx and recs to take 2 regular strength tylenol with the Tramadol She is unsure about the appt with Dr Nevada Crane but will check on this Appt scheduled with SN for 8.28.18 @ 1000 Rx telephoned to verified pharmacy - spoke with pharmacist Maudie Mercury Nothing further needed; will sign off

## 2017-06-15 ENCOUNTER — Other Ambulatory Visit: Payer: Self-pay | Admitting: Pulmonary Disease

## 2017-06-18 ENCOUNTER — Ambulatory Visit (INDEPENDENT_AMBULATORY_CARE_PROVIDER_SITE_OTHER): Payer: Medicare Other | Admitting: Pulmonary Disease

## 2017-06-18 VITALS — BP 132/78 | HR 74 | Temp 97.7°F | Ht 71.0 in | Wt 178.1 lb

## 2017-06-18 DIAGNOSIS — I272 Pulmonary hypertension, unspecified: Secondary | ICD-10-CM

## 2017-06-18 DIAGNOSIS — I255 Ischemic cardiomyopathy: Secondary | ICD-10-CM

## 2017-06-18 DIAGNOSIS — B028 Zoster with other complications: Secondary | ICD-10-CM | POA: Diagnosis not present

## 2017-06-18 DIAGNOSIS — Z9861 Coronary angioplasty status: Secondary | ICD-10-CM | POA: Diagnosis not present

## 2017-06-18 DIAGNOSIS — C44219 Basal cell carcinoma of skin of left ear and external auricular canal: Secondary | ICD-10-CM

## 2017-06-18 DIAGNOSIS — L219 Seborrheic dermatitis, unspecified: Secondary | ICD-10-CM | POA: Diagnosis not present

## 2017-06-18 DIAGNOSIS — J449 Chronic obstructive pulmonary disease, unspecified: Secondary | ICD-10-CM

## 2017-06-18 DIAGNOSIS — Z95810 Presence of automatic (implantable) cardiac defibrillator: Secondary | ICD-10-CM

## 2017-06-18 DIAGNOSIS — I5042 Chronic combined systolic (congestive) and diastolic (congestive) heart failure: Secondary | ICD-10-CM | POA: Diagnosis not present

## 2017-06-18 DIAGNOSIS — I10 Essential (primary) hypertension: Secondary | ICD-10-CM

## 2017-06-18 DIAGNOSIS — I2510 Atherosclerotic heart disease of native coronary artery without angina pectoris: Secondary | ICD-10-CM

## 2017-06-18 MED ORDER — CLOTRIMAZOLE-BETAMETHASONE 1-0.05 % EX CREA: 1.0000 "application " | TOPICAL_CREAM | Freq: Two times a day (BID) | CUTANEOUS | 11 refills | Status: DC
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
## 2017-06-18 NOTE — Patient Instructions (Signed)
Today we updated your med list in our EPIC system...    Continue your current medications the same...  We wrote for a generic LOTRISONE cream to apply to the dermatitis & help it to resolve...  Call for any questions or if we can be of service in any way...  Let's plan a follow up visit in 2-3 months, sooner if needed for any problems...  Marland Kitchen

## 2017-06-20 ENCOUNTER — Other Ambulatory Visit: Payer: Self-pay | Admitting: Pulmonary Disease

## 2017-06-22 ENCOUNTER — Telehealth: Payer: Self-pay | Admitting: Pulmonary Disease

## 2017-06-22 ENCOUNTER — Other Ambulatory Visit: Payer: Self-pay | Admitting: Pulmonary Disease

## 2017-06-22 NOTE — Telephone Encounter (Signed)
Pts daughter called to say her father is not sure if he took his medications in the morning They're wondering if he needs to redose himself Had reviewed his chart. I told him to check his blood pressure and heart rate if they're okay then there is no need to take meds again and just resume him normal schedule.  Marshell Garfinkel MD [facility] 06/22/2017, 10:34 AM

## 2017-06-26 ENCOUNTER — Ambulatory Visit: Payer: Medicare Other | Admitting: Family

## 2017-06-26 ENCOUNTER — Encounter (HOSPITAL_COMMUNITY): Payer: Medicare Other

## 2017-07-01 ENCOUNTER — Telehealth: Payer: Self-pay | Admitting: Pulmonary Disease

## 2017-07-01 NOTE — Telephone Encounter (Signed)
lmtcb x1  Will send Rx after verifying pharmacy.

## 2017-07-01 NOTE — Telephone Encounter (Signed)
lmtcb for pt.  

## 2017-07-01 NOTE — Telephone Encounter (Signed)
Called and spoke with pt. Pt states his penis is swollen and red x3d  Pt denies any burning or trouble urinating.   SN please advise. Thanks.

## 2017-07-01 NOTE — Telephone Encounter (Signed)
SN stated that he would need to see the pt in order to treat what may be going on.  If he can come in today at 2, SN stated that he would see him then  Thanks

## 2017-07-01 NOTE — Telephone Encounter (Signed)
I called [name] & discussed>  SMN He's developed a red rash on his penis he believes from not washing his hands after applying a Shingles cream written by Payton Mccallum-- he says it is LMX4 (a topical lidocaine cream)... I offered to see pt in the office today to check the rash, but he prefers my calling in something to treat this...  REC- LOTRISONE cream, apply Bid & gently wrap penis in gauze to keep from rubbing, do this twice daily & cream w/ warm water Bid... If no responding he knows to call or check w/ his dermatologist ASAP.Marland KitchenMarland Kitchen

## 2017-07-01 NOTE — Addendum Note (Signed)
Addended by: Maryanna Shape A on: 07/01/2017 05:06 PM   Modules accepted: Orders

## 2017-07-03 ENCOUNTER — Other Ambulatory Visit: Payer: Self-pay | Admitting: Pulmonary Disease

## 2017-07-06 ENCOUNTER — Other Ambulatory Visit: Payer: Self-pay | Admitting: Pulmonary Disease

## 2017-07-09 ENCOUNTER — Encounter: Payer: Self-pay | Admitting: Pulmonary Disease

## 2017-07-09 ENCOUNTER — Telehealth: Payer: Self-pay | Admitting: Pulmonary Disease

## 2017-07-09 DIAGNOSIS — M549 Dorsalgia, unspecified: Secondary | ICD-10-CM

## 2017-07-09 NOTE — Telephone Encounter (Signed)
Spoke with patient. He stated that he has been having back pains for the past few months. He stated that the pain will occasionally radiate down to his right leg. He wants to know what SN recommends.    SN, please advise. Thanks!

## 2017-07-10 MED ORDER — TRAMADOL HCL 50 MG PO TABS: 50.0000 mg | ORAL_TABLET | Freq: Four times a day (QID) | ORAL | 1 refills | Status: DC | PRN

## 2017-07-10 MED ORDER — ALENDRONATE SODIUM 70 MG PO TABS: 70.0000 mg | ORAL_TABLET | ORAL | 5 refills | Status: DC

## 2017-07-10 NOTE — Telephone Encounter (Signed)
Per SN---  1.  Need to get back on bone building therapy---restart the alendronate 70 mg  1 every week---take 1st thing in the morning on an empty stomach and do not eat for 1 hour after.   2.  Apply heating pad to his back and alternate using this throughout the day.   3.  Use tramadol 50 mg  Along with tylenol 2 tabs every 6 hours as needed---we can call this in if needed  #100 with refills.   4.  Refer to Dr. Jeanella Cara specialist---to get opinion about his back pain and options for treatment.

## 2017-07-10 NOTE — Telephone Encounter (Signed)
Spoke with pt. He is aware of SN's recommendations. Rxs have been sent/called in. Referral has been placed for ortho. Nothing further was needed.

## 2017-07-11 ENCOUNTER — Encounter: Payer: Self-pay | Admitting: Pulmonary Disease

## 2017-07-11 NOTE — Telephone Encounter (Signed)
Hi Cindy I have scheduled the appt with Dr Louanne Skye for tomorrow 07/12/17 at 10:45am this is a work in appt since he has nothing until the end of October so there may be a wait.  There address is [place] [address] there phone # is [phone]. Hope this will help. Thanks Chantel

## 2017-07-12 ENCOUNTER — Ambulatory Visit (INDEPENDENT_AMBULATORY_CARE_PROVIDER_SITE_OTHER): Payer: Self-pay | Admitting: Specialist

## 2017-07-15 ENCOUNTER — Ambulatory Visit (INDEPENDENT_AMBULATORY_CARE_PROVIDER_SITE_OTHER): Payer: Medicare Other

## 2017-07-15 ENCOUNTER — Telehealth: Payer: Self-pay | Admitting: Cardiology

## 2017-07-15 DIAGNOSIS — Z95810 Presence of automatic (implantable) cardiac defibrillator: Secondary | ICD-10-CM | POA: Diagnosis not present

## 2017-07-15 DIAGNOSIS — I5042 Chronic combined systolic (congestive) and diastolic (congestive) heart failure: Secondary | ICD-10-CM | POA: Diagnosis not present

## 2017-07-15 NOTE — Telephone Encounter (Signed)
Spoke with pt and reminded pt of remote transmission that is due today. Pt verbalized understanding.   

## 2017-07-15 NOTE — Progress Notes (Signed)
EPIC Encounter for ICM Monitoring  Patient Name: [name] is a 83 y.o. male Date: 07/15/2017 Primary Care Physican: Noralee Space, MD Primary Cardiologist: [place] Electrophysiologist: Faustino Congress Weight:unknown       Call todaughter [name], DPR.   Heart Failure questions reviewed, pt asymptomatic   Thoracic impedance normal.  Prescribed dosage: Furosemide 40 mg 1 tablet as needed for weight gain. Potassium 20 mEq 1 tablet every day as needed (Take each time you take a Furosemide).  Labs: 04/01/2017 Creatinine 0.94, BUN 19, Potassium 5.0, Sodium 134, [id number] 01/22/2017 Creatinine 0.94, BUN 19, Potassium 5.0, Sodium 134 01/14/2017 Creatinine 1.13, BUN 39, Potassium 3.7, Sodium 135, EGFR 58->60  01/13/2017 Creatinine 1.30, BUN 28, Potassium 5.1, Sodium 135, EGFR 49-57  11/13/2016 Creatinine 1.13, BUN 17, Potassium 4.6, Sodium 139 09/19/2017Creatinine 1.06, BUN 18, Potassium 4.6, Sodium 133  09/14/2017Creatinine 0.98, BUN 15, Potassium 4.9, Sodium 138  08/16/2017Creatinine 0.84, BUN 11, Potassium 4.8, Sodium 137  05/16/2016 Creatinine 1.07, BUN 16, Potassium 5.1, Sodium 127   Recommendations: No changes.   Encouraged to call for fluid symptoms.  Follow-up plan: ICM clinic phone appointment on 08/15/2017.    Copy of ICM check sent to Dr. Caryl Comes.   3 month ICM trend: 07/15/2017   1 Year ICM trend:      [name], RN 07/15/2017 12:10 PM

## 2017-07-16 ENCOUNTER — Telehealth: Payer: Self-pay | Admitting: Pulmonary Disease

## 2017-07-16 NOTE — Telephone Encounter (Signed)
I have spoken with pt's daughter, [name]. [name] states pt missed apt with Dr. Louanne Skye, due to not seeing mychart message. I have spoken with Dr. Otho Ket office, pt has been scheduled with Harden Mo PA 07-24-17 @ 9:00 Pt's daughter is aware and voiced her understanding. Nothing further needed.

## 2017-07-23 ENCOUNTER — Other Ambulatory Visit: Payer: Self-pay | Admitting: Pulmonary Disease

## 2017-07-24 ENCOUNTER — Ambulatory Visit (INDEPENDENT_AMBULATORY_CARE_PROVIDER_SITE_OTHER): Payer: Medicare Other | Admitting: Surgery

## 2017-07-24 ENCOUNTER — Ambulatory Visit (INDEPENDENT_AMBULATORY_CARE_PROVIDER_SITE_OTHER): Payer: Medicare Other

## 2017-07-24 ENCOUNTER — Encounter (INDEPENDENT_AMBULATORY_CARE_PROVIDER_SITE_OTHER): Payer: Self-pay | Admitting: Surgery

## 2017-07-24 DIAGNOSIS — G8929 Other chronic pain: Secondary | ICD-10-CM

## 2017-07-24 DIAGNOSIS — M5136 Other intervertebral disc degeneration, lumbar region: Secondary | ICD-10-CM

## 2017-07-24 DIAGNOSIS — M545 Low back pain: Secondary | ICD-10-CM | POA: Diagnosis not present

## 2017-07-24 DIAGNOSIS — Z87898 Personal history of other specified conditions: Secondary | ICD-10-CM

## 2017-07-24 DIAGNOSIS — R296 Repeated falls: Secondary | ICD-10-CM
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
## 2017-07-26 DIAGNOSIS — L820 Inflamed seborrheic keratosis: Secondary | ICD-10-CM | POA: Diagnosis not present

## 2017-07-26 DIAGNOSIS — Z85828 Personal history of other malignant neoplasm of skin: Secondary | ICD-10-CM | POA: Diagnosis not present

## 2017-07-26 DIAGNOSIS — Z08 Encounter for follow-up examination after completed treatment for malignant neoplasm: Secondary | ICD-10-CM | POA: Diagnosis not present

## 2017-08-01 ENCOUNTER — Encounter (INDEPENDENT_AMBULATORY_CARE_PROVIDER_SITE_OTHER): Payer: Self-pay | Admitting: Surgery

## 2017-08-01 ENCOUNTER — Encounter (INDEPENDENT_AMBULATORY_CARE_PROVIDER_SITE_OTHER): Payer: Self-pay | Admitting: *Deleted

## 2017-08-05 ENCOUNTER — Ambulatory Visit
Admission: RE | Admit: 2017-08-05 | Discharge: 2017-08-05 | Disposition: A | Discharge status: Home / Self Care | Payer: Medicare Other | Source: Ambulatory Visit | Attending: Surgery | Admitting: Surgery

## 2017-08-05 DIAGNOSIS — G8929 Other chronic pain: Secondary | ICD-10-CM

## 2017-08-05 DIAGNOSIS — M545 Low back pain: Principal | ICD-10-CM

## 2017-08-05 DIAGNOSIS — M5136 Other intervertebral disc degeneration, lumbar region: Secondary | ICD-10-CM

## 2017-08-06 DIAGNOSIS — L308 Other specified dermatitis: Secondary | ICD-10-CM | POA: Diagnosis not present

## 2017-08-06 DIAGNOSIS — B029 Zoster without complications: Secondary | ICD-10-CM | POA: Diagnosis not present

## 2017-08-10 ENCOUNTER — Other Ambulatory Visit: Payer: Self-pay | Admitting: Pulmonary Disease

## 2017-08-15 ENCOUNTER — Ambulatory Visit (INDEPENDENT_AMBULATORY_CARE_PROVIDER_SITE_OTHER): Payer: Medicare Other | Admitting: *Deleted

## 2017-08-15 DIAGNOSIS — I255 Ischemic cardiomyopathy: Secondary | ICD-10-CM

## 2017-08-15 DIAGNOSIS — I5042 Chronic combined systolic (congestive) and diastolic (congestive) heart failure: Secondary | ICD-10-CM | POA: Diagnosis not present

## 2017-08-15 DIAGNOSIS — Z95810 Presence of automatic (implantable) cardiac defibrillator: Secondary | ICD-10-CM | POA: Diagnosis not present

## 2017-08-15 NOTE — Progress Notes (Signed)
Remote ICD transmission.   

## 2017-08-15 NOTE — Progress Notes (Signed)
EPIC Encounter for ICM Monitoring  Patient Name: [name] is a 83 y.o. male Date: 08/15/2017 Primary Care Physican: Jesse Space, MD Primary Cardiologist: [name] Electrophysiologist: [name] Weight:unknown                                                       Attempted call todaughter [name], DPR.    Left detailed message regarding transmission.  Transmission reviewed.    Thoracic impedance normal.  Prescribed dosage: Furosemide 40 mg 1 tablet as needed for weight gain. Potassium 20 mEq 1 tablet every day as needed (Take each time you take a Furosemide).  Labs: 04/01/2017 Creatinine 0.94, BUN 19, Potassium 5.0, Sodium 134, [id number] 01/22/2017 Creatinine 0.94, BUN 19, Potassium 5.0, Sodium 134 01/14/2017 Creatinine 1.13, BUN 39, Potassium 3.7, Sodium 135, EGFR 58->60  01/13/2017 Creatinine 1.30, BUN 28, Potassium 5.1, Sodium 135, EGFR 49-57  11/13/2016 Creatinine 1.13, BUN 17, Potassium 4.6, Sodium 139 09/19/2017Creatinine 1.06, BUN 18, Potassium 4.6, Sodium 133  09/14/2017Creatinine 0.98, BUN 15, Potassium 4.9, Sodium 138  08/16/2017Creatinine 0.84, BUN 11, Potassium 4.8, Sodium 137  05/16/2016 Creatinine 1.07, BUN 16, Potassium 5.1, Sodium 127   Recommendations: Left voice mail with ICM number and encouraged to call if experiencing any fluid symptoms.  Follow-up plan: ICM clinic phone appointment on 09/19/2017.  Office appointment scheduled 09/26/2017 with Dr. Percival [name].  Copy of ICM check sent to Dr. Caryl [name].   3 month ICM trend: 08/15/2017   1 Year ICM trend:      Jesse Billings, RN 08/15/2017 3:43 PM

## 2017-08-16 ENCOUNTER — Encounter: Payer: Self-pay | Admitting: Cardiology

## 2017-08-16 LAB — CUP PACEART REMOTE DEVICE CHECK
Battery Voltage: 3.01 V
Brady Statistic AP VP Percent: 0.07 %
Brady Statistic AS VP Percent: 0.01 %
Brady Statistic AS VS Percent: 2.28 %
Brady Statistic RA Percent Paced: 97.7 %
Date Time Interrogation Session: 20181025052404
HighPow Impedance: 43 Ohm
HighPow Impedance: 54 Ohm
Implantable Lead Implant Date: 20090623
Implantable Lead Location: 753859
Implantable Lead Location: 753860
Implantable Lead Model: 6947
Implantable Pulse Generator Implant Date: 20170616
Lead Channel Impedance Value: 399 Ohm
Lead Channel Impedance Value: 399 Ohm
Lead Channel Pacing Threshold Amplitude: 0.875 V
Lead Channel Sensing Intrinsic Amplitude: 1.125 mV
Lead Channel Sensing Intrinsic Amplitude: 1.125 mV
Lead Channel Sensing Intrinsic Amplitude: 6 mV
Lead Channel Setting Pacing Amplitude: 2.5 V
Lead Channel Setting Pacing Pulse Width: 0.4 ms
MDC IDC LEAD IMPLANT DT: 20090623
MDC IDC MSMT BATTERY REMAINING LONGEVITY: 104 mo
MDC IDC MSMT LEADCHNL RA PACING THRESHOLD AMPLITUDE: 0.875 V
MDC IDC MSMT LEADCHNL RA PACING THRESHOLD PULSEWIDTH: 0.4 ms
MDC IDC MSMT LEADCHNL RV IMPEDANCE VALUE: 304 Ohm
MDC IDC MSMT LEADCHNL RV PACING THRESHOLD PULSEWIDTH: 0.4 ms
MDC IDC MSMT LEADCHNL RV SENSING INTR AMPL: 6 mV
MDC IDC SET LEADCHNL RA PACING AMPLITUDE: 2 V
MDC IDC SET LEADCHNL RV SENSING SENSITIVITY: 0.6 mV
MDC IDC STAT BRADY AP VS PERCENT: 97.64 %
MDC IDC STAT BRADY RV PERCENT PACED: 0.08 %

## 2017-08-20 ENCOUNTER — Ambulatory Visit: Payer: Medicare Other | Admitting: Pulmonary Disease

## 2017-09-02 ENCOUNTER — Other Ambulatory Visit: Payer: Self-pay | Admitting: Pulmonary Disease

## 2017-09-05 ENCOUNTER — Ambulatory Visit (INDEPENDENT_AMBULATORY_CARE_PROVIDER_SITE_OTHER): Payer: Medicare Other | Admitting: Specialist

## 2017-09-05 ENCOUNTER — Encounter (INDEPENDENT_AMBULATORY_CARE_PROVIDER_SITE_OTHER): Payer: Self-pay | Admitting: Specialist

## 2017-09-05 VITALS — BP 145/79 | HR 83 | Ht 71.0 in | Wt 170.0 lb

## 2017-09-05 DIAGNOSIS — S32030S Wedge compression fracture of third lumbar vertebra, sequela: Secondary | ICD-10-CM

## 2017-09-05 DIAGNOSIS — R2689 Other abnormalities of gait and mobility: Secondary | ICD-10-CM

## 2017-09-05 DIAGNOSIS — M8000XD Age-related osteoporosis with current pathological fracture, unspecified site, subsequent encounter for fracture with routine healing: Secondary | ICD-10-CM | POA: Diagnosis not present

## 2017-09-05 DIAGNOSIS — I255 Ischemic cardiomyopathy: Secondary | ICD-10-CM | POA: Diagnosis not present

## 2017-09-05 NOTE — Patient Instructions (Addendum)
Avoid frequent bending and stooping  No lifting greater than 10 lbs. May use ice or moist heat for pain. Weight loss is of benefit. Handicap license is approved. A referral to physical therapy at [facility] is written, to improve balance and coordination. A referral to Dr. Estanislado Pandy ([place]) for evaluation and treatment of osteoporosis with multiple compression fractures for more aggressive treatment.   Fall Prevention and Home Safety Falls cause injuries and can affect all age groups. It is possible to use preventive measures to significantly decrease the likelihood of falls. There are many simple measures which can make your home safer and prevent falls. OUTDOORS  Repair cracks and edges of walkways and driveways.  Remove high doorway thresholds.  Trim shrubbery on the main path into your home.  Have good outside lighting.  Clear walkways of tools, rocks, debris, and clutter.  Check that handrails are not broken and are securely fastened. Both sides of steps should have handrails.  Have leaves, snow, and ice cleared regularly.  Use sand or salt on walkways during winter months.  In the garage, clean up grease or oil spills. BATHROOM  Install night lights.  Install grab bars by the toilet and in the tub and shower.  Use non-skid mats or decals in the tub or shower.  Place a plastic non-slip stool in the shower to sit on, if needed.  Keep floors dry and clean up all water on the floor immediately.  Remove soap buildup in the tub or shower on a regular basis.  Secure bath mats with non-slip, double-sided rug tape.  Remove throw rugs and tripping hazards from the floors. BEDROOMS  Install night lights.  Make sure a bedside light is easy to reach.  Do not use oversized bedding.  Keep a telephone by your bedside.  Have a firm chair with side arms to use for getting dressed.  Remove throw rugs and tripping hazards from the floor. KITCHEN  Keep  handles on pots and pans turned toward the center of the stove. Use back burners when possible.  Clean up spills quickly and allow time for drying.  Avoid walking on wet floors.  Avoid hot utensils and knives.  Position shelves so they are not too high or low.  Place commonly used objects within easy reach.  If necessary, use a sturdy step stool with a grab bar when reaching.  Keep electrical cables out of the way.  Do not use floor polish or wax that makes floors slippery. If you must use wax, use non-skid floor wax.  Remove throw rugs and tripping hazards from the floor. STAIRWAYS  Never leave objects on stairs.  Place handrails on both sides of stairways and use them. Fix any loose handrails. Make sure handrails on both sides of the stairways are as long as the stairs.  Check carpeting to make sure it is firmly attached along stairs. Make repairs to worn or loose carpet promptly.  Avoid placing throw rugs at the top or bottom of stairways, or properly secure the rug with carpet tape to prevent slippage. Get rid of throw rugs, if possible.  Have an electrician put in a light switch at the top and bottom of the stairs. OTHER FALL PREVENTION TIPS  Wear low-heel or rubber-soled shoes that are supportive and fit well. Wear closed toe shoes.  When using a stepladder, make sure it is fully opened and both spreaders are firmly locked. Do not climb a closed stepladder.  Add color or contrast paint  or tape to grab bars and handrails in your home. Place contrasting color strips on first and last steps.  Learn and use mobility aids as needed. Install an electrical emergency response system.  Turn on lights to avoid dark areas. Replace light bulbs that burn out immediately. Get light switches that glow.  Arrange furniture to create clear pathways. Keep furniture in the same place.  Firmly attach carpet with non-skid or double-sided tape.  Eliminate uneven floor surfaces.  Select  a carpet pattern that does not visually hide the edge of steps.  Be aware of all pets. OTHER HOME SAFETY TIPS  Set the water temperature for 120 F (48.8 C).  Keep emergency numbers on or near the telephone.  Keep smoke detectors on every level of the home and near sleeping areas. Document Released: 09/28/2002 Document Revised: 04/08/2012 Document Reviewed: 12/28/2011 [facility] Patient Information 2014 [place].
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
## 2017-09-14 ENCOUNTER — Other Ambulatory Visit: Payer: Self-pay | Admitting: Pulmonary Disease

## 2017-09-18 ENCOUNTER — Encounter: Payer: Self-pay | Admitting: Pulmonary Disease

## 2017-09-18 ENCOUNTER — Ambulatory Visit (INDEPENDENT_AMBULATORY_CARE_PROVIDER_SITE_OTHER): Payer: Medicare Other | Admitting: Pulmonary Disease

## 2017-09-18 VITALS — BP 118/62 | HR 72 | Temp 97.5°F | Ht 70.0 in | Wt 174.2 lb

## 2017-09-18 DIAGNOSIS — J449 Chronic obstructive pulmonary disease, unspecified: Secondary | ICD-10-CM | POA: Diagnosis not present

## 2017-09-18 DIAGNOSIS — I10 Essential (primary) hypertension: Secondary | ICD-10-CM | POA: Diagnosis not present

## 2017-09-18 DIAGNOSIS — Z95810 Presence of automatic (implantable) cardiac defibrillator: Secondary | ICD-10-CM

## 2017-09-18 DIAGNOSIS — I5042 Chronic combined systolic (congestive) and diastolic (congestive) heart failure: Secondary | ICD-10-CM | POA: Diagnosis not present

## 2017-09-18 DIAGNOSIS — I272 Pulmonary hypertension, unspecified: Secondary | ICD-10-CM | POA: Diagnosis not present

## 2017-09-18 DIAGNOSIS — Z23 Encounter for immunization: Secondary | ICD-10-CM | POA: Diagnosis not present

## 2017-09-18 DIAGNOSIS — I2510 Atherosclerotic heart disease of native coronary artery without angina pectoris: Secondary | ICD-10-CM

## 2017-09-18 DIAGNOSIS — I255 Ischemic cardiomyopathy: Secondary | ICD-10-CM

## 2017-09-18 DIAGNOSIS — Z9861 Coronary angioplasty status: Secondary | ICD-10-CM | POA: Diagnosis not present

## 2017-09-18 MED ORDER — FLUTICASONE-SALMETEROL 113-14 MCG/ACT IN AEPB: 2.0000 | INHALATION_SPRAY | Freq: Two times a day (BID) | RESPIRATORY_TRACT | 0 refills | Status: DC

## 2017-09-18 MED ORDER — FLUTICASONE-SALMETEROL 113-14 MCG/ACT IN AEPB: 2.0000 | INHALATION_SPRAY | Freq: Two times a day (BID) | RESPIRATORY_TRACT | 1 refills | Status: DC

## 2017-09-18 NOTE — Patient Instructions (Addendum)
Today we updated your med list in our EPIC system...    We decided to change your Advair to AIRDUO inhaler - 2 sprays twice daily & brush/rinse after each treatment...    This should be just as good 7 less expensive for you (check [facility] on the computer)...  You should also increase the MUCINEX 600mg  tabs to 2 tabs twice daily for the thick mucus...  Continue your other meds the same...  Slowly increase your exercise program...  We gave you the 2018 FLU vaccine today...  Call for any questions...  Let's plan a follow up visit in 3-22mo, sooner if needed for problems.Marland KitchenMarland Kitchen
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
## 2017-09-19 ENCOUNTER — Ambulatory Visit (INDEPENDENT_AMBULATORY_CARE_PROVIDER_SITE_OTHER): Payer: Medicare Other

## 2017-09-19 DIAGNOSIS — Z95810 Presence of automatic (implantable) cardiac defibrillator: Secondary | ICD-10-CM

## 2017-09-19 DIAGNOSIS — I5042 Chronic combined systolic (congestive) and diastolic (congestive) heart failure: Secondary | ICD-10-CM

## 2017-09-20 NOTE — Progress Notes (Signed)
EPIC Encounter for ICM Monitoring  Patient Name: [name] is a 83 y.o. male Date: 09/20/2017 Primary Care Physican: Noralee Space, MD Primary Cardiologist: [place] Electrophysiologist: Faustino Congress Weight:unknown  Call todaughter [name], DPR.    Heart Failure questions reviewed, pt asymptomatic.  She said she is waiting for referral from [place] to get patient some home health because he fell and fractured his back and needs PT.  He is very unbalanced   Thoracic impedance normal.  Prescribed dosage: Furosemide 40 mg 1 tablet as needed for weight gain. Potassium 20 mEq 1 tablet every day as needed (Take each time you take a Furosemide).  Labs: 04/01/2017 Creatinine 0.94, BUN 19, Potassium 5.0, Sodium 134, [id number] 01/22/2017 Creatinine 0.94, BUN 19, Potassium 5.0, Sodium 134 01/14/2017 Creatinine 1.13, BUN 39, Potassium 3.7, Sodium 135, EGFR 58->60  01/13/2017 Creatinine 1.30, BUN 28, Potassium 5.1, Sodium 135, EGFR 49-57  11/13/2016 Creatinine 1.13, BUN 17, Potassium 4.6, Sodium 139 09/19/2017Creatinine 1.06, BUN 18, Potassium 4.6, Sodium 133  09/14/2017Creatinine 0.98, BUN 15, Potassium 4.9, Sodium 138  08/16/2017Creatinine 0.84, BUN 11, Potassium 4.8, Sodium 137  05/16/2016 Creatinine 1.07, BUN 16, Potassium 5.1, Sodium 127   Recommendations: No changes.   Encouraged to call for fluid symptoms.  Follow-up plan: ICM clinic phone appointment on 10/25/2017.  Office appointment scheduled 09/29/2017 with Dr. Percival Spanish.  Copy of ICM check sent to Dr. Caryl Comes.   3 month ICM trend: 09/19/2017    1 Year ICM trend:       Jesse Billings, RN 09/20/2017 12:33 PM

## 2017-09-24 ENCOUNTER — Encounter (INDEPENDENT_AMBULATORY_CARE_PROVIDER_SITE_OTHER): Payer: Self-pay | Admitting: Specialist

## 2017-09-25 ENCOUNTER — Telehealth (INDEPENDENT_AMBULATORY_CARE_PROVIDER_SITE_OTHER): Payer: Self-pay

## 2017-09-25 NOTE — Telephone Encounter (Signed)
[facility] calling about patient stating we should provably refer him to [facility] since the have better things for "balancing" which is what we sent him there for -----please advise where you would like patient to go for PT
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
## 2017-09-25 NOTE — Telephone Encounter (Signed)
[facility] calling about patient stating we should provably refer him to [facility] since the have better things for "balancing" which is what we sent him there for

## 2017-09-26 ENCOUNTER — Ambulatory Visit (INDEPENDENT_AMBULATORY_CARE_PROVIDER_SITE_OTHER): Payer: Medicare Other | Admitting: Cardiology

## 2017-09-26 ENCOUNTER — Encounter: Payer: Self-pay | Admitting: Cardiology

## 2017-09-26 VITALS — BP 110/65 | HR 83 | Ht 70.0 in | Wt 174.0 lb

## 2017-09-26 DIAGNOSIS — Z79899 Other long term (current) drug therapy: Secondary | ICD-10-CM | POA: Diagnosis not present

## 2017-09-26 DIAGNOSIS — I5042 Chronic combined systolic (congestive) and diastolic (congestive) heart failure: Secondary | ICD-10-CM

## 2017-09-26 DIAGNOSIS — I472 Ventricular tachycardia, unspecified: Secondary | ICD-10-CM

## 2017-09-26 DIAGNOSIS — I255 Ischemic cardiomyopathy: Secondary | ICD-10-CM | POA: Diagnosis not present

## 2017-09-26 DIAGNOSIS — I951 Orthostatic hypotension: Secondary | ICD-10-CM | POA: Diagnosis not present

## 2017-09-26 NOTE — Patient Instructions (Signed)
Medication Instructions:  Continue current medications  If you need a refill on your cardiac medications before your next appointment, please call your pharmacy.  Labwork: CBC and BMP Today HERE IN OUR OFFICE AT [facility]  Take the provided lab slips for you to take with you to the lab for you blood draw.   You will NOT need to fast   You may go to any [facility] lab that is convenient for you however, we do have a lab in our office that is able to assist you. You do NOT need an appointment for our lab. Once in our office lobby there is a podium to the right of the check-in desk where you are to sign-in and ring a doorbell to alert [place] you are here. Lab is open Monday-Friday from 8:00am to 4:00pm; and is closed for lunch from 12:45p-1:45pm   Testing/Procedures: None Ordered   Follow-Up: Your physician wants you to follow-up in: 6 Months. You should receive a reminder letter in the mail two months in advance. If you do not receive a letter, please call our office [phone].    Thank you for choosing [facility] at [facility]!!

## 2017-09-27 ENCOUNTER — Encounter: Payer: Self-pay | Admitting: Pulmonary Disease

## 2017-09-27 ENCOUNTER — Telehealth: Payer: Self-pay

## 2017-09-27 LAB — BASIC METABOLIC PANEL
BUN/Creatinine Ratio: 29 — ABNORMAL HIGH (ref 10–24)
BUN: 32 mg/dL — ABNORMAL HIGH (ref 8–27)
CALCIUM: 9 mg/dL (ref 8.6–10.2)
CO2: 25 mmol/L (ref 20–29)
Chloride: 97 mmol/L (ref 96–106)
Creatinine, Ser: 1.12 mg/dL (ref 0.76–1.27)
GFR calc Af Amer: 69 mL/min/{1.73_m2} (ref >59)
GFR, EST NON AFRICAN AMERICAN: 60 mL/min/{1.73_m2} (ref >59)
Glucose: 99 mg/dL (ref 65–99)
POTASSIUM: 4.3 mmol/L (ref 3.5–5.2)
Sodium: 137 mmol/L (ref 134–144)

## 2017-09-27 LAB — CBC
HEMOGLOBIN: 13.7 g/dL (ref 13.0–17.7)
Hematocrit: 38.7 % (ref 37.5–51.0)
MCH: 34.6 pg — AB (ref 26.6–33.0)
MCHC: 35.4 g/dL (ref 31.5–35.7)
MCV: 98 fL — ABNORMAL HIGH (ref 79–97)
Platelets: 119 10*3/uL — ABNORMAL LOW (ref 150–379)
RBC: 3.96 x10E6/uL — AB (ref 4.14–5.80)
RDW: 13.4 % (ref 12.3–15.4)
WBC: 11.4 10*3/uL — ABNORMAL HIGH (ref 3.4–10.8)
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
## 2017-09-27 NOTE — Telephone Encounter (Signed)
I called pt and he stated he was fine. I asked him about the symptoms his daughter suggested in the email and he denied these symtoms. I advised him to go to the ER if he had another episode. Pt understood and verbalized understanding.

## 2017-09-28 ENCOUNTER — Other Ambulatory Visit: Payer: Self-pay | Admitting: Pulmonary Disease

## 2017-09-30 ENCOUNTER — Other Ambulatory Visit: Payer: Self-pay

## 2017-09-30 ENCOUNTER — Inpatient Hospital Stay (HOSPITAL_COMMUNITY): Payer: Medicare Other

## 2017-09-30 ENCOUNTER — Emergency Department (HOSPITAL_COMMUNITY): Payer: Medicare Other

## 2017-09-30 ENCOUNTER — Inpatient Hospital Stay (HOSPITAL_COMMUNITY)
Admission: EM | Admit: 2017-09-30 | Discharge: 2017-10-17 | DRG: 177 | Disposition: A | Discharge status: Home Health | Payer: Medicare Other | Attending: Internal Medicine | Admitting: Internal Medicine

## 2017-09-30 ENCOUNTER — Encounter (HOSPITAL_COMMUNITY): Payer: Self-pay

## 2017-09-30 DIAGNOSIS — R079 Chest pain, unspecified: Secondary | ICD-10-CM

## 2017-09-30 DIAGNOSIS — I110 Hypertensive heart disease with heart failure: Secondary | ICD-10-CM | POA: Diagnosis not present

## 2017-09-30 DIAGNOSIS — Z7189 Other specified counseling: Secondary | ICD-10-CM | POA: Diagnosis not present

## 2017-09-30 DIAGNOSIS — J440 Chronic obstructive pulmonary disease with acute lower respiratory infection: Secondary | ICD-10-CM | POA: Diagnosis present

## 2017-09-30 DIAGNOSIS — S065X9A Traumatic subdural hemorrhage with loss of consciousness of unspecified duration, initial encounter: Secondary | ICD-10-CM | POA: Diagnosis not present

## 2017-09-30 DIAGNOSIS — Z8249 Family history of ischemic heart disease and other diseases of the circulatory system: Secondary | ICD-10-CM

## 2017-09-30 DIAGNOSIS — I739 Peripheral vascular disease, unspecified: Secondary | ICD-10-CM | POA: Diagnosis present

## 2017-09-30 DIAGNOSIS — I509 Heart failure, unspecified: Secondary | ICD-10-CM | POA: Diagnosis not present

## 2017-09-30 DIAGNOSIS — E876 Hypokalemia: Secondary | ICD-10-CM | POA: Diagnosis present

## 2017-09-30 DIAGNOSIS — M810 Age-related osteoporosis without current pathological fracture: Secondary | ICD-10-CM | POA: Diagnosis present

## 2017-09-30 DIAGNOSIS — R091 Pleurisy: Secondary | ICD-10-CM | POA: Diagnosis not present

## 2017-09-30 DIAGNOSIS — Z8701 Personal history of pneumonia (recurrent): Secondary | ICD-10-CM

## 2017-09-30 DIAGNOSIS — D696 Thrombocytopenia, unspecified: Secondary | ICD-10-CM | POA: Diagnosis present

## 2017-09-30 DIAGNOSIS — L8990 Pressure ulcer of unspecified site, unspecified stage: Secondary | ICD-10-CM

## 2017-09-30 DIAGNOSIS — J9811 Atelectasis: Secondary | ICD-10-CM | POA: Diagnosis not present

## 2017-09-30 DIAGNOSIS — Z87891 Personal history of nicotine dependence: Secondary | ICD-10-CM

## 2017-09-30 DIAGNOSIS — I5043 Acute on chronic combined systolic (congestive) and diastolic (congestive) heart failure: Secondary | ICD-10-CM | POA: Diagnosis present

## 2017-09-30 DIAGNOSIS — Z87820 Personal history of traumatic brain injury: Secondary | ICD-10-CM

## 2017-09-30 DIAGNOSIS — J90 Pleural effusion, not elsewhere classified: Secondary | ICD-10-CM

## 2017-09-30 DIAGNOSIS — Z95810 Presence of automatic (implantable) cardiac defibrillator: Secondary | ICD-10-CM | POA: Diagnosis not present

## 2017-09-30 DIAGNOSIS — T501X5A Adverse effect of loop [high-ceiling] diuretics, initial encounter: Secondary | ICD-10-CM | POA: Diagnosis present

## 2017-09-30 DIAGNOSIS — Z79899 Other long term (current) drug therapy: Secondary | ICD-10-CM

## 2017-09-30 DIAGNOSIS — I361 Nonrheumatic tricuspid (valve) insufficiency: Secondary | ICD-10-CM | POA: Diagnosis not present

## 2017-09-30 DIAGNOSIS — Z85828 Personal history of other malignant neoplasm of skin: Secondary | ICD-10-CM | POA: Diagnosis not present

## 2017-09-30 DIAGNOSIS — J690 Pneumonitis due to inhalation of food and vomit: Secondary | ICD-10-CM | POA: Diagnosis not present

## 2017-09-30 DIAGNOSIS — J918 Pleural effusion in other conditions classified elsewhere: Secondary | ICD-10-CM | POA: Diagnosis not present

## 2017-09-30 DIAGNOSIS — Z7951 Long term (current) use of inhaled steroids: Secondary | ICD-10-CM | POA: Diagnosis not present

## 2017-09-30 DIAGNOSIS — E7800 Pure hypercholesterolemia, unspecified: Secondary | ICD-10-CM | POA: Diagnosis present

## 2017-09-30 DIAGNOSIS — J9601 Acute respiratory failure with hypoxia: Secondary | ICD-10-CM

## 2017-09-30 DIAGNOSIS — E785 Hyperlipidemia, unspecified: Secondary | ICD-10-CM | POA: Diagnosis present

## 2017-09-30 DIAGNOSIS — S069X9A Unspecified intracranial injury with loss of consciousness of unspecified duration, initial encounter: Secondary | ICD-10-CM | POA: Diagnosis present

## 2017-09-30 DIAGNOSIS — J209 Acute bronchitis, unspecified: Secondary | ICD-10-CM | POA: Diagnosis present

## 2017-09-30 DIAGNOSIS — I779 Disorder of arteries and arterioles, unspecified: Secondary | ICD-10-CM | POA: Diagnosis present

## 2017-09-30 DIAGNOSIS — Z7902 Long term (current) use of antithrombotics/antiplatelets: Secondary | ICD-10-CM | POA: Diagnosis not present

## 2017-09-30 DIAGNOSIS — J939 Pneumothorax, unspecified: Secondary | ICD-10-CM | POA: Diagnosis not present

## 2017-09-30 DIAGNOSIS — J869 Pyothorax without fistula: Secondary | ICD-10-CM | POA: Diagnosis not present

## 2017-09-30 DIAGNOSIS — I5042 Chronic combined systolic (congestive) and diastolic (congestive) heart failure: Secondary | ICD-10-CM

## 2017-09-30 DIAGNOSIS — J189 Pneumonia, unspecified organism: Secondary | ICD-10-CM | POA: Diagnosis not present

## 2017-09-30 DIAGNOSIS — Z515 Encounter for palliative care: Secondary | ICD-10-CM | POA: Diagnosis not present

## 2017-09-30 DIAGNOSIS — I959 Hypotension, unspecified: Secondary | ICD-10-CM | POA: Diagnosis present

## 2017-09-30 DIAGNOSIS — J948 Other specified pleural conditions: Secondary | ICD-10-CM | POA: Diagnosis not present

## 2017-09-30 DIAGNOSIS — L89151 Pressure ulcer of sacral region, stage 1: Secondary | ICD-10-CM | POA: Diagnosis present

## 2017-09-30 DIAGNOSIS — I10 Essential (primary) hypertension: Secondary | ICD-10-CM

## 2017-09-30 DIAGNOSIS — J181 Lobar pneumonia, unspecified organism: Secondary | ICD-10-CM | POA: Diagnosis not present

## 2017-09-30 DIAGNOSIS — S065XAA Traumatic subdural hemorrhage with loss of consciousness status unknown, initial encounter: Secondary | ICD-10-CM | POA: Diagnosis present

## 2017-09-30 DIAGNOSIS — R0902 Hypoxemia: Secondary | ICD-10-CM

## 2017-09-30 DIAGNOSIS — H9190 Unspecified hearing loss, unspecified ear: Secondary | ICD-10-CM | POA: Diagnosis present

## 2017-09-30 DIAGNOSIS — I272 Pulmonary hypertension, unspecified: Secondary | ICD-10-CM | POA: Diagnosis present

## 2017-09-30 DIAGNOSIS — M109 Gout, unspecified: Secondary | ICD-10-CM | POA: Diagnosis present

## 2017-09-30 DIAGNOSIS — B954 Other streptococcus as the cause of diseases classified elsewhere: Secondary | ICD-10-CM | POA: Diagnosis present

## 2017-09-30 DIAGNOSIS — R0602 Shortness of breath: Secondary | ICD-10-CM | POA: Diagnosis not present

## 2017-09-30 DIAGNOSIS — S069XAA Unspecified intracranial injury with loss of consciousness status unknown, initial encounter: Secondary | ICD-10-CM | POA: Diagnosis present

## 2017-09-30 DIAGNOSIS — I2510 Atherosclerotic heart disease of native coronary artery without angina pectoris: Secondary | ICD-10-CM | POA: Diagnosis present

## 2017-09-30 DIAGNOSIS — I255 Ischemic cardiomyopathy: Secondary | ICD-10-CM | POA: Diagnosis present

## 2017-09-30 DIAGNOSIS — I252 Old myocardial infarction: Secondary | ICD-10-CM

## 2017-09-30 DIAGNOSIS — Z79890 Hormone replacement therapy (postmenopausal): Secondary | ICD-10-CM | POA: Diagnosis not present

## 2017-09-30 DIAGNOSIS — R05 Cough: Secondary | ICD-10-CM | POA: Diagnosis not present

## 2017-09-30 DIAGNOSIS — Z9889 Other specified postprocedural states: Secondary | ICD-10-CM

## 2017-09-30 LAB — CBC
HEMATOCRIT: 39.4 % (ref 39.0–52.0)
HEMATOCRIT: 38.6 % — AB (ref 39.0–52.0)
Hemoglobin: 13.6 g/dL (ref 13.0–17.0)
Hemoglobin: 13.7 g/dL (ref 13.0–17.0)
MCH: 34.3 pg — AB (ref 26.0–34.0)
MCH: 34.9 pg — AB (ref 26.0–34.0)
MCHC: 34.5 g/dL (ref 30.0–36.0)
MCHC: 35.5 g/dL (ref 30.0–36.0)
MCV: 99.2 fL (ref 78.0–100.0)
MCV: 98.5 fL (ref 78.0–100.0)
PLATELETS: 169 10*3/uL (ref 150–400)
Platelets: 189 10*3/uL (ref 150–400)
RBC: 3.97 MIL/uL — AB (ref 4.22–5.81)
RBC: 3.92 MIL/uL — ABNORMAL LOW (ref 4.22–5.81)
RDW: 12.9 % (ref 11.5–15.5)
RDW: 13 % (ref 11.5–15.5)
WBC: 13.6 10*3/uL — ABNORMAL HIGH (ref 4.0–10.5)
WBC: 15.7 10*3/uL — ABNORMAL HIGH (ref 4.0–10.5)

## 2017-09-30 LAB — BASIC METABOLIC PANEL
ANION GAP: 11 (ref 5–15)
BUN: 35 mg/dL — AB (ref 6–20)
CHLORIDE: 99 mmol/L — AB (ref 101–111)
CO2: 23 mmol/L (ref 22–32)
Calcium: 8.7 mg/dL — ABNORMAL LOW (ref 8.9–10.3)
Creatinine, Ser: 0.98 mg/dL (ref 0.61–1.24)
GFR calc Af Amer: >60 mL/min (ref >60)
GLUCOSE: 112 mg/dL — AB (ref 65–99)
POTASSIUM: 4.4 mmol/L (ref 3.5–5.1)
Sodium: 133 mmol/L — ABNORMAL LOW (ref 135–145)

## 2017-09-30 LAB — MRSA PCR SCREENING: MRSA by PCR: NEGATIVE

## 2017-09-30 LAB — ECHOCARDIOGRAM COMPLETE
HEIGHTINCHES: 71 in
Weight: 2720 oz

## 2017-09-30 LAB — CREATININE, SERUM
Creatinine, Ser: 0.96 mg/dL (ref 0.61–1.24)
GFR calc Af Amer: >60 mL/min (ref >60)
GFR calc non Af Amer: >60 mL/min (ref >60)

## 2017-09-30 LAB — I-STAT TROPONIN, ED: Troponin i, poc: 0.11 ng/mL (ref 0.00–0.08)

## 2017-09-30 LAB — BRAIN NATRIURETIC PEPTIDE: B Natriuretic Peptide: 1348.6 pg/mL — ABNORMAL HIGH (ref 0.0–100.0)

## 2017-09-30 MED ORDER — OXYBUTYNIN CHLORIDE 5 MG PO TABS
5.0000 mg | ORAL_TABLET | Freq: Two times a day (BID) | ORAL | Status: DC
  Administered 2017-09-30 – 2017-10-17 (×34): 5 mg via ORAL
  Filled 2017-09-30 (×34): qty 1

## 2017-09-30 MED ORDER — FUROSEMIDE 10 MG/ML IJ SOLN
INTRAMUSCULAR | Status: AC
  Filled 2017-09-30: qty 8

## 2017-09-30 MED ORDER — DEXTROSE 5 % IV SOLN
1.0000 g | INTRAVENOUS | Status: DC
  Administered 2017-10-01 – 2017-10-05 (×5): 1 g via INTRAVENOUS
  Filled 2017-09-30 (×5): qty 10

## 2017-09-30 MED ORDER — DEXTROSE 5 % IV SOLN
500.0000 mg | INTRAVENOUS | Status: DC
  Administered 2017-10-01 – 2017-10-05 (×5): 500 mg via INTRAVENOUS
  Filled 2017-09-30 (×5): qty 500

## 2017-09-30 MED ORDER — TRAMADOL HCL 50 MG PO TABS
50.0000 mg | ORAL_TABLET | Freq: Four times a day (QID) | ORAL | Status: DC | PRN
  Administered 2017-10-10 – 2017-10-15 (×3): 50 mg via ORAL
  Filled 2017-09-30 (×3): qty 1

## 2017-09-30 MED ORDER — CLOPIDOGREL BISULFATE 75 MG PO TABS
75.0000 mg | ORAL_TABLET | Freq: Every day | ORAL | Status: DC
  Administered 2017-10-01 – 2017-10-04 (×4): 75 mg via ORAL
  Filled 2017-09-30 (×4): qty 1

## 2017-09-30 MED ORDER — FUROSEMIDE 10 MG/ML IJ SOLN
60.0000 mg | Freq: Once | INTRAMUSCULAR | Status: AC
  Administered 2017-09-30: 60 mg via INTRAVENOUS
  Filled 2017-09-30: qty 8

## 2017-09-30 MED ORDER — ACETAMINOPHEN 325 MG PO TABS: 650.0000 mg | ORAL_TABLET | Freq: Four times a day (QID) | ORAL | Status: DC | PRN

## 2017-09-30 MED ORDER — DEXTROSE 5 % IV SOLN
500.0000 mg | Freq: Once | INTRAVENOUS | Status: AC
  Administered 2017-09-30: 500 mg via INTRAVENOUS
  Filled 2017-09-30: qty 500

## 2017-09-30 MED ORDER — ALBUTEROL SULFATE (2.5 MG/3ML) 0.083% IN NEBU
5.0000 mg | INHALATION_SOLUTION | Freq: Once | RESPIRATORY_TRACT | Status: AC
  Administered 2017-09-30: 5 mg via RESPIRATORY_TRACT
  Filled 2017-09-30: qty 6

## 2017-09-30 MED ORDER — SERTRALINE HCL 50 MG PO TABS
50.0000 mg | ORAL_TABLET | Freq: Every day | ORAL | Status: DC
  Administered 2017-09-30 – 2017-10-16 (×17): 50 mg via ORAL
  Filled 2017-09-30 (×17): qty 1

## 2017-09-30 MED ORDER — DEXTROSE 5 % IV SOLN
1.0000 g | Freq: Once | INTRAVENOUS | Status: AC
  Administered 2017-09-30: 1 g via INTRAVENOUS
  Filled 2017-09-30: qty 10

## 2017-09-30 MED ORDER — CARVEDILOL 3.125 MG PO TABS
3.1250 mg | ORAL_TABLET | Freq: Two times a day (BID) | ORAL | Status: DC
  Administered 2017-09-30 – 2017-10-11 (×18): 3.125 mg via ORAL
  Filled 2017-09-30 (×21): qty 1

## 2017-09-30 MED ORDER — ACETAMINOPHEN 650 MG RE SUPP
650.0000 mg | Freq: Four times a day (QID) | RECTAL | Status: DC | PRN
Start: 2017-09-30 — End: 2017-10-06

## 2017-09-30 MED ORDER — AMIODARONE HCL 200 MG PO TABS
200.0000 mg | ORAL_TABLET | Freq: Every day | ORAL | Status: DC
  Administered 2017-10-01 – 2017-10-05 (×5): 200 mg via ORAL
  Filled 2017-09-30 (×5): qty 1

## 2017-09-30 MED ORDER — PRAVASTATIN SODIUM 20 MG PO TABS
40.0000 mg | ORAL_TABLET | Freq: Every day | ORAL | Status: DC
  Administered 2017-10-01 – 2017-10-05 (×5): 40 mg via ORAL
  Filled 2017-09-30 (×5): qty 2

## 2017-09-30 MED ORDER — ENOXAPARIN SODIUM 40 MG/0.4ML ~~LOC~~ SOLN
40.0000 mg | SUBCUTANEOUS | Status: DC
  Administered 2017-09-30 – 2017-10-06 (×7): 40 mg via SUBCUTANEOUS
  Filled 2017-09-30 (×7): qty 0.4

## 2017-09-30 MED ORDER — FUROSEMIDE 10 MG/ML IJ SOLN: 40.0000 mg | Freq: Two times a day (BID) | INTRAMUSCULAR | Status: DC

## 2017-09-30 MED ORDER — ALBUTEROL SULFATE HFA 108 (90 BASE) MCG/ACT IN AERS: 2.0000 | INHALATION_SPRAY | Freq: Four times a day (QID) | RESPIRATORY_TRACT | Status: DC | PRN

## 2017-09-30 MED ORDER — ALBUTEROL SULFATE (2.5 MG/3ML) 0.083% IN NEBU: 2.5000 mg | INHALATION_SOLUTION | Freq: Four times a day (QID) | RESPIRATORY_TRACT | Status: DC | PRN

## 2017-09-30 MED ORDER — ALLOPURINOL 100 MG PO TABS
100.0000 mg | ORAL_TABLET | Freq: Every day | ORAL | Status: DC
  Administered 2017-10-01 – 2017-10-05 (×5): 100 mg via ORAL
  Filled 2017-09-30 (×5): qty 1

## 2017-09-30 MED ORDER — FUROSEMIDE 10 MG/ML IJ SOLN
60.0000 mg | Freq: Two times a day (BID) | INTRAMUSCULAR | Status: DC
  Administered 2017-09-30 – 2017-10-03 (×6): 60 mg via INTRAVENOUS
  Filled 2017-09-30 (×5): qty 6

## 2017-09-30 NOTE — ED Notes (Signed)
ED Provider at bedside. 

## 2017-09-30 NOTE — ED Notes (Signed)
Assigned [id number] @ 15:50 call report @ 16:10

## 2017-09-30 NOTE — H&P (Addendum)
History and Physical    [name] QMG:[id number] DOB: 1931-07-15 DOA: 09/30/2017  PCP: Noralee Space, MD  Patient coming from: Home  Chief Complaint: sob  HPI: [name] is a 83 y.o. 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  Patient states he told this to patient's daughter who lives out of state.  Patient's daughter advised patient to be seen in the emergency department.  Patient's daughter arrived to visit patient who subsequently brought patient to the emergency department for further workup.  Of note, patient denies any fevers or chills or sweats, albeit patient is a poor historian when interviewed today.  ED Course: In the emergency department, patient was noted to have a presenting white blood count of 13.6 thousand with a BNP of 1348 (previous BNP was 496).  Patient was started on empiric azithromycin and Rocephin.  Patient was also given 1 dose of IV Lasix in the emergency department.  Hospitalist service consulted for consideration for admission.  Review of Systems:  Review of Systems  Constitutional: Negative for chills, diaphoresis and weight loss.  HENT: Negative for congestion, ear discharge, ear pain and tinnitus.   Eyes: Negative for double vision, photophobia and pain.  Respiratory: Positive for cough, shortness of breath and wheezing.   Cardiovascular: Negative for palpitations, orthopnea and claudication.  Gastrointestinal: Negative for abdominal pain, constipation, diarrhea and heartburn.  Genitourinary: Negative for flank pain, frequency and urgency.  Musculoskeletal: Negative for back pain, joint pain and neck pain.  Neurological: Negative for tingling, tremors, seizures, loss of consciousness and headaches.    Psychiatric/Behavioral: Negative for hallucinations, memory loss, substance abuse and suicidal ideas.    Past Medical History:  Diagnosis Date  . AAA (abdominal aortic aneurysm) ([place])    a. 2002 s/p repair.  . Abnormal liver function tests   . AICD (automatic cardioverter/defibrillator) present   . Allergic rhinitis   . Atherosclerotic heart disease   . Back pain   . CAD (coronary artery disease)    a. 02/2002 H/o MI with stenting x 2; b. 04/2013 MV: EF 25%, large posterior lateral infarct w/o ischemia; c. 03/2016 VT Arrest/Cath: LM 60ost, LAD 40p/m ISR, LCX 100p/m, RCA nl-->Med Rx.  . Carotid arterial disease ([place])    a. 12/2010 s/p R CEA;  b. 05/2015 Carotid U/S: bilat <40% ICA stenosis.  . Chronic combined systolic and diastolic CHF (congestive heart failure) ([place])    a. 03/2016 Echo: Ef 35-40%, grade 1 DD.  Marland Kitchen Compression fracture   . COPD (chronic obstructive pulmonary disease) ([place])   . Coronary artery disease   . Dermatitis   . Diverticulosis of colon   . DJD (degenerative joint disease)    and Gout  . Hemorrhoids   . History of colonic polyps   . History of gout   . History of pneumonia   . Hypercholesterolemia   . Hypertension   . Hypertensive heart disease   . Ischemic cardiomyopathy    a. EF prev <35%-->improved to normal by Echo 8/14:  Mild LVH, focal basal hypertrophy, EF 60-65%, normal wall motion, mild BAE, PASP 36; c. 03/2016 Echo: EF 35-40%, Gr1 DD, triv AI, mild MR, mod dil LA, mild-mod TR, PASP 60mmHg.  . Osteoporosis   . Peripheral vascular disease ([place])   . Presence of cardiac defibrillator  a. 03/2008 s/p MDT [id number] Maximo II DR, DC AICD; b. 03/2013: ICD shock for T wave oversensing;  c. 10/2015: collective decision not to replace ICD given improvement in LV fxn; d. VT Arrest-->Gen change to MDT ser # [id number] H.  . Skin cancer    shoulders and forehead  . Syncope   . T wave over sensing resulting in inappropriate shocks    a. 03/2013.  . Tobacco abuse   .  Ventricular tachycardia ([place]) 04/01/2016    Past Surgical History:  Procedure Laterality Date  . ABDOMINAL AORTIC ANEURYSM REPAIR  2002   by Dr. Kellie Simmering  . aicd placed  03/2008   Dr. Caryl Comes  . cad stent  02/2002   Dr. Percival Spanish  . CARDIAC CATHETERIZATION     X 2 stents  . CARDIAC CATHETERIZATION N/A 04/03/2016   Procedure: Left Heart Cath and Coronary Angiography;  Surgeon: Belva Crome, MD;  Location: [facility];  Service: Cardiovascular;  Laterality: N/A;  . CARDIAC DEFIBRILLATOR PLACEMENT  2009  . CARDIAC DEFIBRILLATOR PLACEMENT    . CAROTID ENDARTERECTOMY Right January 02, 2011  . EAR CYST EXCISION Left 12/13/2015   Procedure: Excision left ear lesion ;  Surgeon: Melissa Montane, MD;  Location: [facility];  Service: ENT;  Laterality: Left;  . EP IMPLANTABLE DEVICE N/A 04/06/2016   Procedure:  ICD Generator Changeout;  Surgeon: Deboraha Sprang, MD;  Location: [facility];  Service: Cardiovascular;  Laterality: N/A;  . EXCISION OF LESION LEFT EAR Left 12/13/2015  . I&D EXTREMITY Left 04/23/2016   Procedure: IRRIGATION AND DEBRIDEMENT HAND;  Surgeon: Leandrew Koyanagi, MD;  Location: [facility];  Service: Orthopedics;  Laterality: Left;  . OPEN REDUCTION INTERNAL FIXATION (ORIF) METACARPAL Left 04/04/2016   Procedure: OPEN REDUCTION INTERNAL FIXATION (ORIF) LEFT 2ND, 3RD, 4TH METACARPAL FRACTURE;  Surgeon: Leandrew Koyanagi, MD;  Location: [facility];  Service: Orthopedics;  Laterality: Left;  OPEN REDUCTION INTERNAL FIXATION (ORIF) LEFT 2ND, 3RD, 4TH METACARPAL FRACTURE  . OPEN REDUCTION INTERNAL FIXATION (ORIF) METACARPAL Left 04/23/2016   Procedure: REVISION OPEN REDUCTION INTERNAL FIXATION (ORIF) 2ND METACARPAL;  Surgeon: Leandrew Koyanagi, MD;  Location: [facility];  Service: Orthopedics;  Laterality: Left;  . right corotid enderectomy  12/2010   Dr. Scot Dock  . SKIN SPLIT GRAFT Left 12/13/2015   Procedure: with possible skin graft;  Surgeon: Melissa Montane, MD;  Location: [facility];  Service: ENT;  Laterality: Left;  .  TONSILLECTOMY       reports that he quit smoking about 4 years ago. His smoking use included cigarettes and cigars. he has never used smokeless tobacco. He reports that he drinks alcohol. He reports that he does not use drugs.  Allergies  Allergen Reactions  . Lisinopril     BP dropped too low per daughter    Family History  Problem Relation Age of Onset  . Hypertension Father   . Heart disease Father        Heart Disease before age 40  . Hypertension Mother   . Heart disease Mother        Heart Disease before age 22  . Cancer Mother     Prior to Admission medications   Medication Sig Start Date End Date Taking? Authorizing Provider  albuterol (PROVENTIL HFA;VENTOLIN HFA) 108 (90 Base) MCG/ACT inhaler Inhale 2 puffs into the lungs every 6 (six) hours as needed for wheezing or shortness of breath. 01/14/17   Debbe Odea, MD  alendronate (FOSAMAX) 70  MG tablet Take 1 tablet (70 mg total) by mouth once a week. Take with a full glass of water on an empty stomach. 07/10/17   Noralee Space, MD  allopurinol (ZYLOPRIM) 100 MG tablet TAKE 1 TABLET ONCE DAILY. 09/16/17   Noralee Space, MD  amiodarone (PACERONE) 200 MG tablet TAKE 1 TABLET ONCE DAILY. 06/03/17   Noralee Space, MD  carvedilol (COREG) 3.125 MG tablet TAKE 1 TABLET TWICE DAILY WITH A MEAL. 06/10/17   Minus Breeding, MD  cholecalciferol (VITAMIN D) 1000 units tablet Take 1,000 Units by mouth daily.    [provider]  clopidogrel (PLAVIX) 75 MG tablet TAKE 1 TABLET ONCE DAILY. 06/25/17   Noralee Space, MD  clotrimazole-betamethasone (LOTRISONE) cream Apply 1 application topically 2 (two) times daily. 06/18/17   Noralee Space, MD  Fluticasone-Salmeterol [facility]P[place] 657/84) 696-29 MCG/ACT AEPB Inhale 2 puffs into the lungs 2 (two) times daily. 09/18/17   Noralee Space, MD  furosemide (LASIX) 40 MG tablet TAKE 1 TABLET DAILY AS NEEDED. (FOR WEIGHT GAIN) 06/25/17   Noralee Space, MD  guaiFENesin (MUCINEX) 600 MG 12 hr  tablet Take 600-1,200 mg by mouth 2 (two) times daily.    [provider]  levothyroxine (SYNTHROID, LEVOTHROID) 25 MCG tablet TAKE 1 TABLET IN THE MORNING ON AN EMPTY STOMACH. 07/08/17   Noralee Space, MD  magnesium oxide (MAG-OX) 400 (241.3 Mg) MG tablet Take 0.5 tablets (200 mg total) by mouth daily. 04/27/16   Love, Ivan Anchors, PA-C  Multiple Vitamin (MULTIVITAMIN) capsule Take 1 capsule by mouth daily.    [provider]  Multiple Vitamins-Minerals (PRESERVISION/LUTEIN) CAPS Take 1 capsule by mouth daily.     [provider]  oxybutynin (DITROPAN) 5 MG tablet TAKE 1 TABLET BY MOUTH TWICE DAILY. 09/02/17   Noralee Space, MD  polyethylene glycol powder (GLYCOLAX/MIRALAX) powder DISSOLVE ONE CAPFUL IN 8 0Z. WATER TWICE DAILY. 07/23/17   Noralee Space, MD  potassium chloride SA (K-DUR,KLOR-CON) 20 MEQ tablet TAKE 1 TABLET DAILY AS NEEDED. (TAKE EACH TIME YOU TAKE FUROSEMIDE) 04/29/17   Noralee Space, MD  pravastatin (PRAVACHOL) 40 MG tablet TAKE 1 TABLET ONCE DAILY. 08/13/17   Noralee Space, MD  sertraline (ZOLOFT) 50 MG tablet TAKE ONE TABLET AT BEDTIME. 09/02/17   Noralee Space, MD  thiamine 100 MG tablet Take 1 tablet (100 mg total) by mouth daily. 04/26/16   Love, Ivan Anchors, PA-C  traMADol (ULTRAM) 50 MG tablet Take 1 tablet (50 mg total) by mouth every 6 (six) hours as needed. Take this with 2 regular strength tylenol) 07/10/17   Noralee Space, MD    Physical Exam: Vitals:   09/30/17 0945 09/30/17 1005 09/30/17 1010 09/30/17 1100  BP:  (!) 123/46  (!) 117/59  Pulse: 65 62 64 85  Resp: 20 (!) 27 (!) 23 (!) 26  Temp:      TempSrc:      SpO2: 94% 95% 96% 94%  Weight:      Height:        Constitutional: NAD, calm, comfortable Vitals:   09/30/17 0945 09/30/17 1005 09/30/17 1010 09/30/17 1100  BP:  (!) 123/46  (!) 117/59  Pulse: 65 62 64 85  Resp: 20 (!) 27 (!) 23 (!) 26  Temp:      TempSrc:      SpO2: 94% 95% 96% 94%  Weight:      Height:  Eyes:  PERRL, lids and conjunctivae normal ENMT: Mucous membranes are moist. Posterior pharynx clear of any exudate or lesions.Normal dentition.  Neck: normal, supple, no masses, no thyromegaly Respiratory: Increased respiratory effort, crackles auscultated bilaterally Cardiovascular: Regular rate and rhythm, S1, S2. No extremity edema. 2+ pedal pulses. No carotid bruits.  Abdomen: no tenderness, no masses palpated. No hepatosplenomegaly. Bowel sounds positive.  Musculoskeletal: no clubbing / cyanosis. No joint deformity upper and lower extremities. Good ROM, no contractures. Normal muscle tone.  Skin: no rashes, lesions, ulcers. No induration Neurologic: CN 2-12 grossly intact. Sensation intact, DTR normal. Strength 5/5 in all 4.  Psychiatric: Normal judgment and insight. Alert and oriented x 3. Normal mood.    Labs on Admission: I have personally reviewed following labs and imaging studies  CBC: Recent Labs  Lab 09/26/17 1411 09/30/17 0956  WBC 11.4* 13.6*  HGB 13.7 13.6  HCT 38.7 39.4  MCV 98* 99.2  PLT 119* 038   Basic Metabolic Panel: Recent Labs  Lab 09/26/17 1411 09/30/17 0956  NA 137 133*  K 4.3 4.4  CL 97 99*  CO2 25 23  GLUCOSE 99 112*  BUN 32* 35*  CREATININE 1.12 0.98  CALCIUM 9.0 8.7*   GFR: Estimated Creatinine Clearance: 59.8 mL/min (by C-G formula based on SCr of 0.98 mg/dL). Liver Function Tests: No results for input(s): AST, ALT, ALKPHOS, BILITOT, PROT, ALBUMIN in the last 168 hours. No results for input(s): LIPASE, AMYLASE in the last 168 hours. No results for input(s): AMMONIA in the last 168 hours. Coagulation Profile: No results for input(s): INR, PROTIME in the last 168 hours. Cardiac Enzymes: No results for input(s): CKTOTAL, CKMB, CKMBINDEX, TROPONINI in the last 168 hours. BNP (last 3 results) Recent Labs    01/22/17 1231  PROBNP 496.0*   HbA1C: No results for input(s): HGBA1C in the last 72 hours. CBG: No results for input(s): GLUCAP in  the last 168 hours. Lipid Profile: No results for input(s): CHOL, HDL, LDLCALC, TRIG, CHOLHDL, LDLDIRECT in the last 72 hours. Thyroid Function Tests: No results for input(s): TSH, T4TOTAL, FREET4, T3FREE, THYROIDAB in the last 72 hours. Anemia Panel: No results for input(s): VITAMINB12, FOLATE, FERRITIN, TIBC, IRON, RETICCTPCT in the last 72 hours. Urine analysis:    Component Value Date/Time   COLORURINE YELLOW 04/14/2016 1028   APPEARANCEUR CLEAR 04/14/2016 1028   LABSPEC 1.013 04/14/2016 1028   PHURINE 6.0 04/14/2016 1028   GLUCOSEU NEGATIVE 04/14/2016 1028   HGBUR NEGATIVE 04/14/2016 1028   BILIRUBINUR NEGATIVE 04/14/2016 1028   KETONESUR NEGATIVE 04/14/2016 1028   PROTEINUR NEGATIVE 04/14/2016 1028   UROBILINOGEN 2.0 (H) 10/28/2012 0820   NITRITE NEGATIVE 04/14/2016 1028   LEUKOCYTESUR NEGATIVE 04/14/2016 1028   Sepsis Labs: !!!!!!!!!!!!!!!!!!!!!!!!!!!!!!!!!!!!!!!!!!!! @LABRCNTIP (procalcitonin:4,lacticidven:4) )No results found for this or any previous visit (from the past 240 hour(s)).   Radiological Exams on Admission: Dg Chest 2 View  Result Date: 09/30/2017 CLINICAL DATA:  Shortness of breath, productive cough, former smoker, hypertension, COPD, coronary artery disease EXAM: CHEST  2 VIEW COMPARISON:  01/22/2017 FINDINGS: LEFT subclavian AICD leads project at RIGHT atrium and RIGHT ventricle, unchanged. Enlargement of cardiac silhouette. Atherosclerotic calcification aorta. Calcified RIGHT paratracheal node. LEFT basilar atelectasis. Small RIGHT pleural effusion and basilar atelectasis with additional consolidation in RIGHT lower lobe question pneumonia. Upper lungs clear with underlying emphysematous changes noted. No pneumothorax or acute osseous findings. IMPRESSION: RIGHT pleural effusion with bibasilar atelectasis and RIGHT lower lobe infiltrate question pneumonia. Enlargement of cardiac silhouette post AICD with underlying  COPD. Electronically Signed   By: Lavonia Dana  M.D.   On: 09/30/2017 09:45    EKG: Independently reviewed. Paced, QTc 527  Assessment/Plan Principal Problem:   Acute hypoxemic respiratory failure (HCC) Active Problems:   Essential hypertension   Carotid arterial disease (HCC)   CAP (community acquired pneumonia)   Cardiomyopathy, ischemic   Subdural hematoma (HCC)   Traumatic subdural hematoma with loss of consciousness (HCC)   Pulmonary hypertension (HCC)   TBI (traumatic brain injury) (HCC)   Thrombocytopenia (HCC)   Hypertensive heart disease with heart failure (HCC)   Chronic combined systolic and diastolic CHF (congestive heart failure) (HCC)   COPD with acute bronchitis ([place])   1. Acute hypoxemic respiratory failure 1. Initially had been on [facility] with increased tachypnea 2. Likely secondary to below PNA as well as acute on chronic combined CHF 3. Cont abx and diuretics per below 2. HTN 1. BP currently stable and controlled 2. Cont home meds as tolerated 3. CAP 1. CXR findings reviewed. Evidence of PNA with effusion 2. Cont on azithromycin and rocephin 3. Follow cultures 4. Presents with leukocytosis of 13.6k 5. Repeat CBC in AM 4. Acute on chronic combined sys and dias chf exacerbation 1. Most recent 2d echo with EF 30-35% 2. Presenting BNP of 1348, was 496 on 01/22/17 3. Continue BID IV lasix 4. Remains with tachypena 5. COPD 1. Stable at present 2. Cont to wean O2 as tolerated 6. Hx subdural hematoma 1. Appears stable at this time 7. Prolonged QTc 1. QTc of 527. Would avoid QT prolonging medications  DVT prophylaxis: Lovenox subQ  Code Status: Full Family Communication: Pt in room  Disposition Plan: Uncertain at this time  Consults called:  Admission status: Inpatient as would require greater than 2 midnight stay for IV lasix for chf   Marylu Lund MD [facility] Pager [phone]  If 7PM-7AM, please contact night-coverage [email or web address] Password [facility]  09/30/2017, 11:37 AM

## 2017-09-30 NOTE — ED Triage Notes (Signed)
Per EMS- patient saw a pulmonologist last week for a cough and was prescribed Mucinex. Today, the patient c/o SOB  Room air sats-90%, coarse LLL.. Family reported that the patient has gotten progressively worse.

## 2017-09-30 NOTE — ED Notes (Signed)
MEGAN RRT TRANSPORTED WITH PT TO ICU

## 2017-09-30 NOTE — ED Provider Notes (Signed)
Pt presents with cough and congestion.  New oxygen requirement.  PNA noted on xray.  Will start on abx.   With his age and co morbidities will plan on admission for IV abx.  Medical screening examination/treatment/procedure(s) were conducted as a shared visit with non-physician practitioner(s) and myself.  I personally evaluated the patient during the encounter.   EKG Interpretation  Date/Time:  Monday September 30 2017 08:58:43 EST Ventricular Rate:  61 PR Interval:    QRS Duration: 178 QT Interval:  523 QTC Calculation: 527 R Axis:   96 Text Interpretation:  Atrial-paced rhythm RBBB and LPFB Inferior infarct, age indeterminate Baseline wander in lead(s) V2 No significant change since last tracing Confirmed by Dorie Rank [phone]) on 09/30/2017 9:11:20 AM         Dorie Rank, MD 09/30/17 1007

## 2017-09-30 NOTE — ED Notes (Signed)
EDPA Provider at bedside. 

## 2017-09-30 NOTE — Progress Notes (Signed)
  Echocardiogram 2D Echocardiogram has been performed.  Darlina Sicilian M 09/30/2017, 12:44 PM

## 2017-09-30 NOTE — ED Notes (Signed)
Bed: [facility]PT2 Expected date:  Expected time:  Means of arrival:  Comments: 

## 2017-09-30 NOTE — ED Notes (Addendum)
HOSPITALIST RETURNED CALL-ORDERS CHANGED- PT IS GOING TO STEP-DOWN. 4TH FLOOR PAM NOTIFIED OF FLOOR CHANGE.

## 2017-09-30 NOTE — ED Notes (Signed)
CONTACT INFORMATION CONNIE DAUGHTER- CELL- [phone]

## 2017-09-30 NOTE — ED Notes (Signed)
Bed: WLPT1 Expected date:  Expected time:  Means of arrival:  Comments: 

## 2017-09-30 NOTE — ED Triage Notes (Signed)
Patient states he got dizzy this AM and fell on the floor. Patient c/o left arm pain.

## 2017-09-30 NOTE — ED Notes (Signed)
[name] UPDATED PER PT'S REQUEST. PT DID NOT REQUEST I CONTACT (J JR/SON). " SHE IS HERE. JUST CALL HER.". [name] STATES SHE WOULD GIVE HER BROTHER THE UPDATE. MADE AWARE OF ROOM CHANGE, RN KYLE AND CONTACT NUMBER.

## 2017-09-30 NOTE — ED Notes (Signed)
1ST SET BLOOD CULTURE OBTAINED. 5ML/EACH. RIGHT FOREARM. ANTIBIOTIC ALREADY GIVEN. BLOOD CULTURES NOT ORDER AT TIME ANTIBIOTICS ORDERED

## 2017-09-30 NOTE — ED Provider Notes (Signed)
[facility] DEPT Provider Note   CSN: [id number] Arrival date & time: 09/30/17  5427     History   Chief Complaint Chief Complaint  Patient presents with  . Shortness of Breath  . Dizziness    HPI [name] is a 83 y.o. male with history of CAD, COPD, AAA repair, AICD present who presents with a several week history of cough and shortness of breath.  He has had some associated right-sided chest tightness that is worse with taking a deep breath.  He has had a productive brown sputum after taking Mucinex over the past few days.  He denies any fevers, chest pain, abdominal pain, nausea, vomiting.  Patient has been taking his at home inhalers without relief.  Patient does not have an oxygen requirement at home.  He has not been on any antibiotics or oral corticosteroids recently.  HPI  Past Medical History:  Diagnosis Date  . AAA (abdominal aortic aneurysm) ([place])    a. 2002 s/p repair.  . Abnormal liver function tests   . AICD (automatic cardioverter/defibrillator) present   . Allergic rhinitis   . Atherosclerotic heart disease   . Back pain   . CAD (coronary artery disease)    a. 02/2002 H/o MI with stenting x 2; b. 04/2013 MV: EF 25%, large posterior lateral infarct w/o ischemia; c. 03/2016 VT Arrest/Cath: LM 60ost, LAD 40p/m ISR, LCX 100p/m, RCA nl-->Med Rx.  . Carotid arterial disease ([name])    a. 12/2010 s/p R CEA;  b. 05/2015 Carotid U/S: bilat <40% ICA stenosis.  . Chronic combined systolic and diastolic CHF (congestive heart failure) ([place])    a. 03/2016 Echo: Ef 35-40%, grade 1 DD.  Marland Kitchen Compression fracture   . COPD (chronic obstructive pulmonary disease) ([place])   . Coronary artery disease   . Dermatitis   . Diverticulosis of colon   . DJD (degenerative joint disease)    and Gout  . Hemorrhoids   . History of colonic polyps   . History of gout   . History of pneumonia   . Hypercholesterolemia   . Hypertension   . Hypertensive heart disease    . Ischemic cardiomyopathy    a. EF prev <35%-->improved to normal by Echo 8/14:  Mild LVH, focal basal hypertrophy, EF 60-65%, normal wall motion, mild BAE, PASP 36; c. 03/2016 Echo: EF 35-40%, Gr1 DD, triv AI, mild MR, mod dil LA, mild-mod TR, PASP 78mmHg.  . Osteoporosis   . Peripheral vascular disease ([place])   . Presence of cardiac defibrillator    a. 03/2008 s/p MDT [id number] Maximo II DR, DC AICD; b. 03/2013: ICD shock for T wave oversensing;  c. 10/2015: collective decision not to replace ICD given improvement in LV fxn; d. VT Arrest-->Gen change to MDT ser # [id number].  . Skin cancer    shoulders and forehead  . Syncope   . T wave over sensing resulting in inappropriate shocks    a. 03/2013.  . Tobacco abuse   . Ventricular tachycardia ([place]) 04/01/2016    Patient Active Problem List   Diagnosis Date Noted  . Acute hypoxemic respiratory failure ([place]) 09/30/2017  . Shingles outbreak 05/27/2017  . Seborrheic dermatitis 05/27/2017  . Hypotension due to drugs 04/01/2017  . Acute on chronic respiratory failure with hypoxia ([place]) 01/13/2017  . COPD with acute bronchitis ([place]) 01/13/2017  . CAD S/P percutaneous coronary angioplasty   . Chronic combined systolic and diastolic CHF (congestive heart failure) ([place])   .  Blunt chest trauma 05/04/2016  . 4.1 cm Aneurysm of thoracic aorta ([place]) 04/27/2016  . SIADH (syndrome of inappropriate ADH production) ([place])   . Hypertensive heart disease with heart failure ([place])   . Orthostatic hypotension 04/12/2016  . TBI (traumatic brain injury) ([place])   . Acute on chronic combined systolic and diastolic congestive heart failure ([place])   . S/P ORIF (open reduction internal fixation) fracture   . Thrombocytopenia ([place])   . Urinary retention   . Slow transit constipation   . Thyroid activity decreased   . IVCD (intraventricular conduction defect) 04/05/2016  . Traumatic subdural hematoma with loss of consciousness ([place])   . Pulmonary hypertension ([place])     . Cardiomyopathy, ischemic   . Subdural hematoma ([place])   . Ventricular tachycardia ([place]) 04/01/2016  . Cardiac arrest ([place]) 03/29/2016  . 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 CAP (community acquired pneumonia) 04/28/2015  . T wave over sensing resulting in inappropriate shocks 08/14/2013  . Vitamin D deficiency disease 04/09/2012  . Actinic skin damage 05/22/2011  . Alcohol use 03/22/2011  . Other dysphagia 02/16/2011  . Carotid arterial disease ([place]) 11/17/2009  . GOUT 09/23/2009  . SYNCOPE 04/19/2009  . Closed fracture of bone 07/27/2008  . Automatic implantable cardioverter-defibrillator in situ 04/13/2008  . COLONIC POLYPS 12/09/2007  . DIVERTICULOSIS OF COLON 12/09/2007  . Essential hypertension 12/08/2007  . Coronary atherosclerosis 12/08/2007  . Peripheral vascular disease ([place]) 12/08/2007  . COPD GOLD III 12/08/2007  . Osteoarthritis 12/08/2007    Past Surgical History:  Procedure Laterality Date  . ABDOMINAL AORTIC ANEURYSM REPAIR  2002   by Dr. Kellie Simmering  . aicd placed  03/2008   Dr. Caryl Comes  . cad stent  02/2002   Dr. Percival Spanish  . CARDIAC CATHETERIZATION     X 2 stents  . CARDIAC CATHETERIZATION N/A 04/03/2016   Procedure: Left Heart Cath and Coronary Angiography;  Surgeon: Belva Crome, MD;  Location: [facility];  Service: Cardiovascular;  Laterality: N/A;  . CARDIAC DEFIBRILLATOR PLACEMENT  2009  . CARDIAC DEFIBRILLATOR PLACEMENT    . CAROTID ENDARTERECTOMY Right January 02, 2011  . EAR CYST EXCISION Left 12/13/2015   Procedure: Excision left ear lesion ;  Surgeon: Melissa Montane, MD;  Location: [facility];  Service: ENT;  Laterality: Left;  . EP IMPLANTABLE DEVICE N/A 04/06/2016   Procedure:  ICD Generator Changeout;  Surgeon: Deboraha Sprang, MD;  Location: [facility];  Service: Cardiovascular;  Laterality: N/A;  . EXCISION OF LESION LEFT EAR Left 12/13/2015  . I&D EXTREMITY Left 04/23/2016   Procedure: IRRIGATION AND DEBRIDEMENT HAND;  Surgeon:  Leandrew Koyanagi, MD;  Location: [facility];  Service: Orthopedics;  Laterality: Left;  . OPEN REDUCTION INTERNAL FIXATION (ORIF) METACARPAL Left 04/04/2016   Procedure: OPEN REDUCTION INTERNAL FIXATION (ORIF) LEFT 2ND, 3RD, 4TH METACARPAL FRACTURE;  Surgeon: Leandrew Koyanagi, MD;  Location: [facility];  Service: Orthopedics;  Laterality: Left;  OPEN REDUCTION INTERNAL FIXATION (ORIF) LEFT 2ND, 3RD, 4TH METACARPAL FRACTURE  . OPEN REDUCTION INTERNAL FIXATION (ORIF) METACARPAL Left 04/23/2016   Procedure: REVISION OPEN REDUCTION INTERNAL FIXATION (ORIF) 2ND METACARPAL;  Surgeon: Leandrew Koyanagi, MD;  Location: [facility];  Service: Orthopedics;  Laterality: Left;  . right corotid enderectomy  12/2010   Dr. Scot Dock  . SKIN SPLIT GRAFT Left 12/13/2015   Procedure: with possible skin graft;  Surgeon: Melissa Montane, MD;  Location: [facility];  Service: ENT;  Laterality: Left;  .  TONSILLECTOMY         Home Medications    Prior to Admission medications   Medication Sig Start Date End Date Taking? Authorizing Provider  albuterol (PROVENTIL HFA;VENTOLIN HFA) 108 (90 Base) MCG/ACT inhaler Inhale 2 puffs into the lungs every 6 (six) hours as needed for wheezing or shortness of breath. 01/14/17  Yes Rizwan, Eunice Blase, MD  allopurinol (ZYLOPRIM) 100 MG tablet TAKE 1 TABLET ONCE DAILY. 09/16/17  Yes Noralee Space, MD  amiodarone (PACERONE) 200 MG tablet TAKE 1 TABLET ONCE DAILY. 06/03/17  Yes Noralee Space, MD  carvedilol (COREG) 3.125 MG tablet TAKE 1 TABLET TWICE DAILY WITH A MEAL. 06/10/17  Yes Minus Breeding, MD  cholecalciferol (VITAMIN D) 1000 units tablet Take 1,000 Units by mouth daily.   Yes [provider]  clopidogrel (PLAVIX) 75 MG tablet TAKE 1 TABLET ONCE DAILY. 06/25/17  Yes Noralee Space, MD  Fluticasone-Salmeterol [facility] RESP[place]ICK 629/52) 841-32 MCG/ACT AEPB Inhale 2 puffs into the lungs 2 (two) times daily. 09/18/17  Yes Noralee Space, MD  furosemide (LASIX) 40 MG tablet TAKE 1 TABLET DAILY AS NEEDED. (FOR WEIGHT  GAIN) Patient taking differently: Take one tablet by mouth every other day. 06/25/17  Yes Noralee Space, MD  guaiFENesin (MUCINEX) 600 MG 12 hr tablet Take 600-1,200 mg by mouth 2 (two) times daily. Takes two in the morning and one in the evening.   Yes [provider]  levothyroxine (SYNTHROID, LEVOTHROID) 25 MCG tablet TAKE 1 TABLET IN THE MORNING ON AN EMPTY STOMACH. 07/08/17  Yes Noralee Space, MD  magnesium oxide (MAG-OX) 400 (241.3 Mg) MG tablet Take 0.5 tablets (200 mg total) by mouth daily. 04/27/16  Yes Love, Ivan Anchors, PA-C  Multiple Vitamin (MULTIVITAMIN) capsule Take 1 capsule by mouth daily.   Yes [provider]  Multiple Vitamins-Minerals (PRESERVISION/LUTEIN) CAPS Take 1 capsule by mouth 2 (two) times daily.    Yes [provider]  oxybutynin (DITROPAN) 5 MG tablet TAKE 1 TABLET BY MOUTH TWICE DAILY. 09/02/17  Yes Noralee Space, MD  polyethylene glycol powder (GLYCOLAX/MIRALAX) powder DISSOLVE ONE CAPFUL IN 8 0Z. WATER TWICE DAILY. 07/23/17  Yes Noralee Space, MD  potassium chloride SA (K-DUR,KLOR-CON) 20 MEQ tablet TAKE 1 TABLET DAILY AS NEEDED. (TAKE EACH TIME YOU TAKE FUROSEMIDE) Patient taking differently: Take one tablet by mouth every other day. 04/29/17  Yes Noralee Space, MD  pravastatin (PRAVACHOL) 40 MG tablet TAKE 1 TABLET ONCE DAILY. 08/13/17  Yes Noralee Space, MD  sertraline (ZOLOFT) 50 MG tablet TAKE ONE TABLET AT BEDTIME. 09/02/17  Yes Noralee Space, MD  thiamine 100 MG tablet Take 1 tablet (100 mg total) by mouth daily. 04/26/16  Yes Love, Ivan Anchors, PA-C  ADVAIR DISKUS 250-50 MCG/DOSE AEPB Inhale 1 puff into the lungs 2 (two) times daily. 07/23/17   [provider]  alendronate (FOSAMAX) 70 MG tablet Take 1 tablet (70 mg total) by mouth once a week. Take with a full glass of water on an empty stomach. 07/10/17   Noralee Space, MD  clotrimazole-betamethasone (LOTRISONE) cream Apply 1 application topically 2 (two) times daily. Patient not  taking: Reported on 09/30/2017 06/18/17   Noralee Space, MD    Family History Family History  Problem Relation Age of Onset  . Hypertension Father   . Heart disease Father        Heart Disease before age 41  . Hypertension Mother   . Heart disease Mother  Heart Disease before age 65  . Cancer Mother     Social History Social History   Tobacco Use  . Smoking status: Former Smoker    Types: Cigarettes, Cigars    Last attempt to quit: 10/22/2012    Years since quitting: 4.9  . Smokeless tobacco: Never Used  . Tobacco comment: quit about 10 years ago  Substance Use Topics  . Alcohol use: Yes    Alcohol/week: 0.0 oz    Types: 3 Glasses of wine per week    Comment: weekly  . Drug use: No     Allergies   Lisinopril   Review of Systems Review of Systems  Constitutional: Negative for chills and fever.  HENT: Negative for facial swelling and sore throat.   Respiratory: Positive for cough, chest tightness and shortness of breath.   Cardiovascular: Negative for chest pain.  Gastrointestinal: Negative for abdominal pain, nausea and vomiting.  Genitourinary: Negative for dysuria.  Musculoskeletal: Negative for back pain.  Skin: Negative for rash and wound.  Neurological: Negative for headaches.  Psychiatric/Behavioral: The patient is not nervous/anxious.      Physical Exam Updated Vital Signs BP 115/62   Pulse 63   Temp 100.2 F (37.9 C) (Oral)   Resp (!) 28   Ht 5\' 11"  (1.803 m)   Wt 77.1 kg (170 lb)   SpO2 96%   BMI 23.71 kg/m   Physical Exam  Constitutional: He appears well-developed and well-nourished. No distress.  HENT:  Head: Normocephalic and atraumatic.  Mouth/Throat: Oropharynx is clear and moist. No oropharyngeal exudate.  Eyes: Conjunctivae are normal. Pupils are equal, round, and reactive to light. Right eye exhibits no discharge. Left eye exhibits no discharge. No scleral icterus.  Neck: Normal range of motion. Neck supple. No thyromegaly  present.  Cardiovascular: Normal rate, regular rhythm, normal heart sounds and intact distal pulses. Exam reveals no gallop and no friction rub.  No murmur heard. Pulmonary/Chest: Effort normal. No stridor. No respiratory distress. He has decreased breath sounds (R base). He has no wheezes. He has rales (L base).  Abdominal: Soft. Bowel sounds are normal. He exhibits no distension. There is no tenderness. There is no rebound and no guarding.  Musculoskeletal: He exhibits no edema.  Lymphadenopathy:    He has no cervical adenopathy.  Neurological: He is alert. Coordination normal.  Skin: Skin is warm and dry. No rash noted. He is not diaphoretic. No pallor.  Psychiatric: He has a normal mood and affect.  Nursing note and vitals reviewed.    ED Treatments / Results  Labs (all labs ordered are listed, but only abnormal results are displayed) Labs Reviewed  BASIC METABOLIC PANEL - Abnormal; Notable for the following components:      Result Value   Sodium 133 (*)    Chloride 99 (*)    Glucose, Bld 112 (*)    BUN 35 (*)    Calcium 8.7 (*)    All other components within normal limits  CBC - Abnormal; Notable for the following components:   WBC 13.6 (*)    RBC 3.97 (*)    MCH 34.3 (*)    All other components within normal limits  BRAIN NATRIURETIC PEPTIDE - Abnormal; Notable for the following components:   B Natriuretic Peptide 1,348.6 (*)    All other components within normal limits  I-STAT TROPONIN, ED - Abnormal; Notable for the following components:   Troponin i, poc 0.11 (*)    All other components within normal  limits  CULTURE, BLOOD (ROUTINE X 2)  CULTURE, BLOOD (ROUTINE X 2)  CULTURE, EXPECTORATED SPUTUM-ASSESSMENT  GRAM STAIN  HIV ANTIBODY (ROUTINE TESTING)  STREP PNEUMONIAE URINARY ANTIGEN  CBC  CREATININE, SERUM    EKG  EKG Interpretation  Date/Time:  Monday September 30 2017 08:58:43 EST Ventricular Rate:  61 PR Interval:    QRS Duration: 178 QT  Interval:  523 QTC Calculation: 527 R Axis:   96 Text Interpretation:  Atrial-paced rhythm RBBB and LPFB Inferior infarct, age indeterminate Baseline wander in lead(s) V2 No significant change since last tracing Confirmed by Dorie Rank [id number]) on 09/30/2017 9:11:20 AM       Radiology Dg Chest 2 View  Result Date: 09/30/2017 CLINICAL DATA:  Shortness of breath, productive cough, former smoker, hypertension, COPD, coronary artery disease EXAM: CHEST  2 VIEW COMPARISON:  01/22/2017 FINDINGS: LEFT subclavian AICD leads project at RIGHT atrium and RIGHT ventricle, unchanged. Enlargement of cardiac silhouette. Atherosclerotic calcification aorta. Calcified RIGHT paratracheal node. LEFT basilar atelectasis. Small RIGHT pleural effusion and basilar atelectasis with additional consolidation in RIGHT lower lobe question pneumonia. Upper lungs clear with underlying emphysematous changes noted. No pneumothorax or acute osseous findings. IMPRESSION: RIGHT pleural effusion with bibasilar atelectasis and RIGHT lower lobe infiltrate question pneumonia. Enlargement of cardiac silhouette post AICD with underlying COPD. Electronically Signed   By: Lavonia Dana M.D.   On: 09/30/2017 09:45    Procedures Procedures (including critical care time)  Medications Ordered in ED Medications  furosemide (LASIX) injection 60 mg (not administered)  enoxaparin (LOVENOX) injection 40 mg (not administered)  acetaminophen (TYLENOL) tablet 650 mg (not administered)    Or  acetaminophen (TYLENOL) suppository 650 mg (not administered)  azithromycin (ZITHROMAX) 500 mg in dextrose 5 % 250 mL IVPB (not administered)  cefTRIAXone (ROCEPHIN) 1 g in dextrose 5 % 50 mL IVPB (not administered)  furosemide (LASIX) injection 40 mg (not administered)  albuterol (PROVENTIL HFA;VENTOLIN HFA) 108 (90 Base) MCG/ACT inhaler 2 puff (not administered)  allopurinol (ZYLOPRIM) tablet 100 mg (not administered)  amiodarone (PACERONE) tablet 200 mg  (not administered)  carvedilol (COREG) tablet 3.125 mg (not administered)  clopidogrel (PLAVIX) tablet 75 mg (not administered)  oxybutynin (DITROPAN) tablet 5 mg (not administered)  pravastatin (PRAVACHOL) tablet 40 mg (not administered)  sertraline (ZOLOFT) tablet 50 mg (not administered)  traMADol (ULTRAM) tablet 50 mg (not administered)  albuterol (PROVENTIL) (2.5 MG/3ML) 0.083% nebulizer solution 5 mg (5 mg Nebulization Given 09/30/17 0945)  cefTRIAXone (ROCEPHIN) 1 g in dextrose 5 % 50 mL IVPB (0 g Intravenous Stopped 09/30/17 1056)  azithromycin (ZITHROMAX) 500 mg in dextrose 5 % 250 mL IVPB (500 mg Intravenous New Bag/Given 09/30/17 1112)     Initial Impression / Assessment and Plan / ED Course  I have reviewed the triage vital signs and the nursing notes.  Pertinent labs & imaging results that were available during my care of the patient were reviewed by me and considered in my medical decision making (see chart for details).     Patient with right lower lobe pneumonia with right pleural effusion concurrent with CHF exacerbation.  WBC 13.6.  BUN 35.  Troponin mildly elevated, 0.11.  BNP 1348.6.  I feel patient's troponin is related to demand ischemia, patient denies any chest pain at this time.  EKG is also stable.  Rocephin and azithromycin initiated in the ED.  Patient feeling a little better after albuterol nebulized nasal cannula oxygen.  Patient also given IV Lasix 60mg .  I consulted [facility]  hospitalist, Dr. Wyline Copas, who will admit the patient for further evaluation and treatment.  Patient also evaluated by Dr. Tomi Bamberger who guided the patient's management and agrees with plan.  Final Clinical Impressions(s) / ED Diagnoses   Final diagnoses:  Community acquired pneumonia of right lower lobe of lung ([place])  Pleural effusion  Acute on chronic congestive heart failure, unspecified heart failure type [facility])    ED Discharge Orders    None       Frederica Kuster, PA-C 09/30/17  1322    Dorie Rank, MD 10/01/17 [phone]

## 2017-09-30 NOTE — ED Notes (Signed)
PAM RN AWARE OF NEED FOR 2ND SET OF BLOOD CULTURE. RESCHEDULE MEDICATIONS.

## 2017-09-30 NOTE — ED Notes (Signed)
ED TO INPATIENT HANDOFF REPORT  Name/Age/Gender [name] 83 y.o. male  Code Status    Code Status Orders  (From admission, onward)        Start     Ordered   09/30/17 1148  Full code  Continuous     09/30/17 1148    Code Status History    Date Active Date Inactive Code Status Order ID Comments User Context   01/12/2017 20:26 01/14/2017 23:22 Full Code [id number]  Caren Griffins, MD Inpatient   04/09/2016 16:36 04/27/2016 15:45 Full Code [id number]  Bary Leriche PA-C Inpatient   03/29/2016 09:38 04/09/2016 16:36 Full Code [id number]  Awilda Bill, NP ED   12/13/2015 13:32 12/14/2015 13:04 Full Code [id number]  Melissa Montane, MD Inpatient    Advance Directive Documentation     Most Recent Value  Type of Advance Directive  Healthcare Power of Attorney  Pre-existing out of facility DNR order (yellow form or pink MOST form)  No data  "MOST" Form in Place?  No data      Home/SNF/Other Home  Chief Complaint sob  Level of Care/Admitting Diagnosis ED Disposition    ED Disposition Condition Comment   Admit  Hospital Area: [facility] [[id number]]  Level of Care: Telemetry [5]  Admit to tele based on following criteria: Complex arrhythmia (Bradycardia/Tachycardia)  Diagnosis: Acute hypoxemic respiratory failure [facility]) [[id number]]  Admitting Physician: Velta Addison  Attending Physician: Donne Hazel [[id number]]  Estimated length of stay: 5 - 7 days  Certification:: I certify this patient will need inpatient services for at least 2 midnights  PT Class (Do Not Modify): Inpatient [101]  PT Acc Code (Do Not Modify): Private [1]       Medical History Past Medical History:  Diagnosis Date  . AAA (abdominal aortic aneurysm) ([place])    a. 2002 s/p repair.  . Abnormal liver function tests   . AICD (automatic cardioverter/defibrillator) present   . Allergic rhinitis   . Atherosclerotic heart disease   . Back pain   . CAD (coronary artery disease)    a.  02/2002 H/o MI with stenting x 2; b. 04/2013 MV: EF 25%, large posterior lateral infarct w/o ischemia; c. 03/2016 VT Arrest/Cath: LM 60ost, LAD 40p/m ISR, LCX 100p/m, RCA nl-->Med Rx.  . Carotid arterial disease ([place])    a. 12/2010 s/p R CEA;  b. 05/2015 Carotid U/S: bilat <40% ICA stenosis.  . Chronic combined systolic and diastolic CHF (congestive heart failure) ([place])    a. 03/2016 Echo: Ef 35-40%, grade 1 DD.  Marland Kitchen Compression fracture   . COPD (chronic obstructive pulmonary disease) ([place])   . Coronary artery disease   . Dermatitis   . Diverticulosis of colon   . DJD (degenerative joint disease)    and Gout  . Hemorrhoids   . History of colonic polyps   . History of gout   . History of pneumonia   . Hypercholesterolemia   . Hypertension   . Hypertensive heart disease   . Ischemic cardiomyopathy    a. EF prev <35%-->improved to normal by Echo 8/14:  Mild LVH, focal basal hypertrophy, EF 60-65%, normal wall motion, mild BAE, PASP 36; c. 03/2016 Echo: EF 35-40%, Gr1 DD, triv AI, mild MR, mod dil LA, mild-mod TR, PASP 56mHg.  . Osteoporosis   . [place]ipheral vascular disease ([place]   . Presence of cardiac defibrillator    a. 03/2008 s/p MDT [id number] Maximo II DR, DC AICD;  b. 03/2013: ICD shock for T wave oversensing;  c. 10/2015: collective decision not to replace ICD given improvement in LV fxn; d. VT Arrest-->Gen change to MDT ser # [id number].  . Skin cancer    shoulders and forehead  . Syncope   . T wave over sensing resulting in inappropriate shocks    a. 03/2013.  . Tobacco abuse   . Ventricular tachycardia ([name]) 04/01/2016    Allergies Allergies  Allergen Reactions  . Lisinopril     BP dropped too low per daughter    IV Location/Drains/Wounds Patient Lines/Drains/Airways Status   Active Line/Drains/Airways    Name:   Placement date:   Placement time:   Site:   Days:   Peripheral IV 09/30/17 Right Forearm   09/30/17    1000    Forearm   less than 1   Incision (Closed) 12/13/15 Ear  Left   12/13/15    1210     657   Incision (Closed) 04/04/16 Arm Left   04/04/16    1823     544   Incision (Closed) 04/09/16 Chest Left   04/09/16    1655     539   Incision (Closed) 04/16/16 Flank Right;Lateral   04/16/16    1430     532   Incision (Closed) 04/23/16 Hand Left   04/23/16    1236     525   Pressure Ulcer 03/29/16 Stage I -  Intact skin with non-blanchable redness of a localized area usually over a bony prominence.   03/29/16    1145     550   Pressure Injury 09/30/17 Deep Tissue Injury - Purple or maroon localized area of discolored intact skin or blood-filled blister due to damage of underlying soft tissue from pressure and/or shear.   09/30/17    1404     less than 1   Wound / Incision (Open or Dehisced) 12/13/15 Incision - Open Shoulder Right   12/13/15    1556    Shoulder   657   Wound / Incision (Open or Dehisced) 03/29/16 Hand Left   03/29/16    1252    Hand   550   Wound / Incision (Open or Dehisced) 04/12/16 Other (Comment) Toe (Comment  which one) Left   04/12/16    0811    Toe (Comment  which one)   536          Labs/Imaging Results for orders placed or performed during the hospital encounter of 09/30/17 (from the past 48 hour(s))  Basic metabolic panel     Status: Abnormal   Collection Time: 09/30/17  9:56 AM  Result Value Ref Range   Sodium 133 (L) 135 - 145 mmol/L   Potassium 4.4 3.5 - 5.1 mmol/L   Chloride 99 (L) 101 - 111 mmol/L   CO2 23 22 - 32 mmol/L   Glucose, Bld 112 (H) 65 - 99 mg/dL   BUN 35 (H) 6 - 20 mg/dL   Creatinine, Ser 0.98 0.61 - 1.24 mg/dL   Calcium 8.7 (L) 8.9 - 10.3 mg/dL   GFR calc non Af Amer >60 >60 mL/min   GFR calc Af Amer >60 >60 mL/min    Comment: (NOTE) The eGFR has been calculated using the CKD EPI equation. This calculation has not been validated in all clinical situations. eGFR's persistently <60 mL/min signify possible Chronic Kidney Disease.    Anion gap 11 5 - 15  CBC     Status:  Abnormal   Collection Time: 09/30/17   9:56 AM  Result Value Ref Range   WBC 13.6 (H) 4.0 - 10.5 K/uL   RBC 3.97 (L) 4.22 - 5.81 MIL/uL   Hemoglobin 13.6 13.0 - 17.0 g/dL   HCT 39.4 39.0 - 52.0 %   MCV 99.2 78.0 - 100.0 fL   MCH 34.3 (H) 26.0 - 34.0 pg   MCHC 34.5 30.0 - 36.0 g/dL   RDW 12.9 11.5 - 15.5 %   Platelets 169 150 - 400 K/uL  Brain natriuretic peptide     Status: Abnormal   Collection Time: 09/30/17  9:56 AM  Result Value Ref Range   B Natriuretic Peptide 1,348.6 (H) 0.0 - 100.0 pg/mL  I-stat troponin, ED     Status: Abnormal   Collection Time: 09/30/17 10:10 AM  Result Value Ref Range   Troponin i, poc 0.11 (HH) 0.00 - 0.08 ng/mL   Comment NOTIFIED PHYSICIAN    Comment 3            Comment: Due to the release kinetics of cTnI, a negative result within the first hours of the onset of symptoms does not rule out myocardial infarction with certainty. If myocardial infarction is still suspected, repeat the test at appropriate intervals.   CBC     Status: Abnormal   Collection Time: 09/30/17  1:20 PM  Result Value Ref Range   WBC 15.7 (H) 4.0 - 10.5 K/uL   RBC 3.92 (L) 4.22 - 5.81 MIL/uL   Hemoglobin 13.7 13.0 - 17.0 g/dL   HCT 38.6 (L) 39.0 - 52.0 %   MCV 98.5 78.0 - 100.0 fL   MCH 34.9 (H) 26.0 - 34.0 pg   MCHC 35.5 30.0 - 36.0 g/dL   RDW 13.0 11.5 - 15.5 %   Platelets 189 150 - 400 K/uL  Creatinine, serum     Status: None   Collection Time: 09/30/17  1:20 PM  Result Value Ref Range   Creatinine, Ser 0.96 0.61 - 1.24 mg/dL   GFR calc non Af Amer >60 >60 mL/min   GFR calc Af Amer >60 >60 mL/min    Comment: (NOTE) The eGFR has been calculated using the CKD EPI equation. This calculation has not been validated in all clinical situations. eGFR's persistently <60 mL/min signify possible Chronic Kidney Disease.    Dg Chest 2 View  Result Date: 09/30/2017 CLINICAL DATA:  Shortness of breath, productive cough, former smoker, hypertension, COPD, coronary artery disease EXAM: CHEST  2 VIEW COMPARISON:   01/22/2017 FINDINGS: LEFT subclavian AICD leads project at RIGHT atrium and RIGHT ventricle, unchanged. Enlargement of cardiac silhouette. Atherosclerotic calcification aorta. Calcified RIGHT paratracheal node. LEFT basilar atelectasis. Small RIGHT pleural effusion and basilar atelectasis with additional consolidation in RIGHT lower lobe question pneumonia. Upper lungs clear with underlying emphysematous changes noted. No pneumothorax or acute osseous findings. IMPRESSION: RIGHT pleural effusion with bibasilar atelectasis and RIGHT lower lobe infiltrate question pneumonia. Enlargement of cardiac silhouette post AICD with underlying COPD. Electronically Signed   By: Lavonia Dana M.D.   On: 09/30/2017 09:45    Pending Labs Unresulted Labs (From admission, onward)   Start     Ordered   10/07/17 0500  Creatinine, serum  (enoxaparin (LOVENOX)    CrCl >/= 30 ml/min)  Weekly,   R    Comments:  while on enoxaparin therapy    09/30/17 1148   10/01/17 0500  Comprehensive metabolic panel  Tomorrow morning,   R  09/30/17 1148   10/01/17 0500  CBC  Tomorrow morning,   R     09/30/17 1148   09/30/17 1148  Culture, blood (routine x 2) Call MD if unable to obtain prior to antibiotics being given  BLOOD CULTURE X 2,   R    Comments:  If blood cultures drawn in Emergency Department - Do not draw and cancel order    09/30/17 1148   09/30/17 1148  Culture, sputum-assessment  Once,   R     09/30/17 1148   09/30/17 1148  Gram stain  Once,   R     09/30/17 1148   09/30/17 1148  HIV antibody  Once,   R     09/30/17 1148   09/30/17 1148  Strep pneumoniae urinary antigen  Once,   R     09/30/17 1148      Vitals/Pain Today's Vitals   09/30/17 1200 09/30/17 1330 09/30/17 1345 09/30/17 1400  BP: (!) 110/57 117/72  (!) 125/58  Pulse: 61 63 62 (!) 59  Resp: (!) 26 (!) 30 (!) 28 (!) 28  Temp:    99.2 F (37.3 C)  TempSrc:    Oral  SpO2: 94% 100% 99% 99%  Weight:      Height:        Isolation  Precautions No active isolations  Medications Medications  enoxaparin (LOVENOX) injection 40 mg (not administered)  acetaminophen (TYLENOL) tablet 650 mg (not administered)    Or  acetaminophen (TYLENOL) suppository 650 mg (not administered)  azithromycin (ZITHROMAX) 500 mg in dextrose 5 % 250 mL IVPB (not administered)  cefTRIAXone (ROCEPHIN) 1 g in dextrose 5 % 50 mL IVPB (not administered)  furosemide (LASIX) injection 40 mg (not administered)  albuterol (PROVENTIL HFA;VENTOLIN HFA) 108 (90 Base) MCG/ACT inhaler 2 puff (not administered)  allopurinol (ZYLOPRIM) tablet 100 mg (not administered)  amiodarone (PACERONE) tablet 200 mg (not administered)  carvedilol (COREG) tablet 3.125 mg (not administered)  clopidogrel (PLAVIX) tablet 75 mg (not administered)  oxybutynin (DITROPAN) tablet 5 mg (not administered)  pravastatin (PRAVACHOL) tablet 40 mg (not administered)  sertraline (ZOLOFT) tablet 50 mg (not administered)  traMADol (ULTRAM) tablet 50 mg (not administered)  albuterol (PROVENTIL) (2.5 MG/3ML) 0.083% nebulizer solution 5 mg (5 mg Nebulization Given 09/30/17 0945)  cefTRIAXone (ROCEPHIN) 1 g in dextrose 5 % 50 mL IVPB (0 g Intravenous Stopped 09/30/17 1056)  azithromycin (ZITHROMAX) 500 mg in dextrose 5 % 250 mL IVPB (0 mg Intravenous Stopped 09/30/17 1212)  furosemide (LASIX) injection 60 mg (60 mg Intravenous Given 09/30/17 1343)    Mobility {Mobility:2014

## 2017-09-30 NOTE — ED Notes (Signed)
Bed: WA25 Expected date:  Expected time:  Means of arrival:  Comments: TR 2 

## 2017-10-01 DIAGNOSIS — L8990 Pressure ulcer of unspecified site, unspecified stage: Secondary | ICD-10-CM

## 2017-10-01 LAB — COMPREHENSIVE METABOLIC PANEL
ALBUMIN: 2.6 g/dL — AB (ref 3.5–5.0)
ALT: 27 U/L (ref 17–63)
ANION GAP: 12 (ref 5–15)
AST: 39 U/L (ref 15–41)
Alkaline Phosphatase: 69 U/L (ref 38–126)
BUN: 32 mg/dL — ABNORMAL HIGH (ref 6–20)
CO2: 26 mmol/L (ref 22–32)
Calcium: 8.1 mg/dL — ABNORMAL LOW (ref 8.9–10.3)
Chloride: 93 mmol/L — ABNORMAL LOW (ref 101–111)
Creatinine, Ser: 1 mg/dL (ref 0.61–1.24)
GFR calc non Af Amer: >60 mL/min (ref >60)
GLUCOSE: 110 mg/dL — AB (ref 65–99)
POTASSIUM: 3.2 mmol/L — AB (ref 3.5–5.1)
Sodium: 131 mmol/L — ABNORMAL LOW (ref 135–145)
Total Bilirubin: 1.1 mg/dL (ref 0.3–1.2)
Total Protein: 6.5 g/dL (ref 6.5–8.1)

## 2017-10-01 LAB — CBC
HEMATOCRIT: 37.9 % — AB (ref 39.0–52.0)
HEMOGLOBIN: 13.4 g/dL (ref 13.0–17.0)
MCH: 34.2 pg — ABNORMAL HIGH (ref 26.0–34.0)
MCHC: 35.4 g/dL (ref 30.0–36.0)
MCV: 96.7 fL (ref 78.0–100.0)
Platelets: 168 10*3/uL (ref 150–400)
RBC: 3.92 MIL/uL — AB (ref 4.22–5.81)
RDW: 13 % (ref 11.5–15.5)
WBC: 15.7 10*3/uL — AB (ref 4.0–10.5)

## 2017-10-01 LAB — HIV ANTIBODY (ROUTINE TESTING W REFLEX): HIV Screen 4th Generation wRfx: NONREACTIVE

## 2017-10-01 MED ORDER — POTASSIUM CHLORIDE 10 MEQ/100ML IV SOLN
10.0000 meq | INTRAVENOUS | Status: AC
  Administered 2017-10-01 (×3): 10 meq via INTRAVENOUS
  Filled 2017-10-01 (×3): qty 100

## 2017-10-01 NOTE — Progress Notes (Signed)
Patient ID: [name], male   DOB: [birth year], 83 y.o.   MRN: [id number]    PROGRESS NOTE  [name]  NUU:[id number] DOB: Jan 06, 1931 DOA: 09/30/2017  PCP: Noralee Space, MD   Brief Narrative:   83 y.o. male with medical history significant of chronic combined diastolic and systolic congestive heart failure, CAD, hypertension, hyperlipidemia, vascular disease who presented to the emergency department with complaints of increased cough and congestion started 2 weeks prior to hospital admission.    ED Course: Pt had WBC 13K, started empiric Zithromax and Rocephin, also given IV lasix and TRH asked to admit for further evaluation.   Assessment & Plan:   1. Acute hypoxemic respiratory failure 1. Initially had been on [facility] with increased tachypnea 2. Suspect due to acute on chronic combined systolic and diastolic CHF, PNA 3. Continue Antibiotics Zithromax and Rocephin day #2 4. Continue Lasix  5. Possible transfer to tele in next 24 hours  2. HTN 1. BP stable  2. Cont home medical regimen  3. CAP, RLL 1. CXR findings reviewed. Evidence of PNA with effusion 2. Cont on azithromycin and rocephin 3. Follow cultures 4. Presents with leukocytosis of 13.6k 5. CBC in AM 4. Acute on chronic combined sys and dias chf exacerbation 1. Most recent 2d echo with EF 30-35% 2. Presenting BNP of 1348, was 496 on 01/22/17 3. Continue IV Lasix  5. COPD 1. No wheezing at this time  6. Hx subdural hematoma 1. Stable at this time  7. Prolonged QTc 1. QTc of 527. Would avoid QT prolonging medications  DVT prophylaxis: Lovenox SQ Code Status: Full  Family Communication: Patient at bedside  Disposition Plan: To be determined   Consultants:   None  Procedures:   None  Antimicrobials:   Zithromax 12/10 -->  Rocephin 12/10 -->  Subjective: Pt reports feeling better.   Objective: Vitals:   10/01/17 1600 10/01/17 1604 10/01/17 1700 10/01/17 1800  BP:  (!) 116/58 (!) 114/48 (!) 124/55   Pulse:  66 65 68  Resp:   (!) 21 (!) 31  Temp: 99.3 F (37.4 C)     TempSrc: Oral     SpO2:   94% 91%  Weight:      Height:        Intake/Output Summary (Last 24 hours) at 10/01/2017 1832 Last data filed at 10/01/2017 1400 Gross per 24 hour  Intake 240 ml  Output 2200 ml  Net -1960 ml   Filed Weights   09/30/17 0849  Weight: 77.1 kg (170 lb)    Examination:  General exam: Appears calm and comfortable  Respiratory system: Rhonchi at bases, right > left  Cardiovascular system: S1 & S2 heard, RRR. No JVD, rubs, gallops or clicks. Gastrointestinal system: Abdomen is nondistended, soft and nontender. No organomegaly or masses felt. Normal bowel sounds Central nervous system: Alert and oriented. No focal neurological deficits.  Data Reviewed: I have personally reviewed following labs and imaging studies  CBC: Recent Labs  Lab 09/26/17 1411 09/30/17 0956 09/30/17 1320 10/01/17 0335  WBC 11.4* 13.6* 15.7* 15.7*  HGB 13.7 13.6 13.7 13.4  HCT 38.7 39.4 38.6* 37.9*  MCV 98* 99.2 98.5 96.7  PLT 119* 169 189 347   Basic Metabolic Panel: Recent Labs  Lab 09/26/17 1411 09/30/17 0956 09/30/17 1320 10/01/17 0335  NA 137 133*  --  131*  K 4.3 4.4  --  3.2*  CL 97 99*  --  93*  CO2 25 23  --  26  GLUCOSE 99 112*  --  110*  BUN 32* 35*  --  32*  CREATININE 1.12 0.98 0.96 1.00  CALCIUM 9.0 8.7*  --  8.1*   Liver Function Tests: Recent Labs  Lab 10/01/17 0335  AST 39  ALT 27  ALKPHOS 69  BILITOT 1.1  PROT 6.5  ALBUMIN 2.6*   BNP (last 3 results) Recent Labs    01/22/17 1231  PROBNP 496.0*   Urine analysis:    Component Value Date/Time   COLORURINE YELLOW 04/14/2016 1028   APPEARANCEUR CLEAR 04/14/2016 1028   LABSPEC 1.013 04/14/2016 1028   PHURINE 6.0 04/14/2016 1028   GLUCOSEU NEGATIVE 04/14/2016 1028   HGBUR NEGATIVE 04/14/2016 1028   BILIRUBINUR NEGATIVE 04/14/2016 1028   KETONESUR NEGATIVE 04/14/2016 1028   PROTEINUR NEGATIVE 04/14/2016 1028    UROBILINOGEN 2.0 (H) 10/28/2012 0820   NITRITE NEGATIVE 04/14/2016 1028   LEUKOCYTESUR NEGATIVE 04/14/2016 1028    Recent Results (from the past 240 hour(s))  Culture, blood (routine x 2) Call MD if unable to obtain prior to antibiotics being given     Status: None (Preliminary result)   Collection Time: 09/30/17  1:37 PM  Result Value Ref Range Status   Specimen Description BLOOD RIGHT FOREARM  Final   Special Requests   Final    BOTTLES DRAWN AEROBIC AND ANAEROBIC Blood Culture adequate volume   Culture   Final    NO GROWTH < 24 HOURS Performed at [facility], [place] [address], [place], [place] [address]    Report Status PENDING  Incomplete  MRSA PCR Screening     Status: None   Collection Time: 09/30/17  7:17 PM  Result Value Ref Range Status   MRSA by PCR NEGATIVE NEGATIVE Final    Comment:        The GeneXpert MRSA Assay (FDA approved for NASAL specimens only), is one component of a comprehensive MRSA colonization surveillance program. It is not intended to diagnose MRSA infection nor to guide or monitor treatment for MRSA infections.   Culture, blood (routine x 2) Call MD if unable to obtain prior to antibiotics being given     Status: None (Preliminary result)   Collection Time: 09/30/17  7:39 PM  Result Value Ref Range Status   Specimen Description BLOOD RIGHT ANTECUBITAL  Final   Special Requests IN PEDIATRIC BOTTLE Blood Culture adequate volume  Final   Culture   Final    NO GROWTH < 24 HOURS Performed at [facility], [address] [place], [place] [address]    Report Status PENDING  Incomplete      Radiology Studies: Dg Chest 2 View  Result Date: 09/30/2017 CLINICAL DATA:  Shortness of breath, productive cough, former smoker, hypertension, COPD, coronary artery disease EXAM: CHEST  2 VIEW COMPARISON:  01/22/2017 FINDINGS: LEFT subclavian AICD leads project at RIGHT atrium and RIGHT ventricle, unchanged. Enlargement of cardiac silhouette.  Atherosclerotic calcification aorta. Calcified RIGHT paratracheal node. LEFT basilar atelectasis. Small RIGHT pleural effusion and basilar atelectasis with additional consolidation in RIGHT lower lobe question pneumonia. Upper lungs clear with underlying emphysematous changes noted. No pneumothorax or acute osseous findings. IMPRESSION: RIGHT pleural effusion with bibasilar atelectasis and RIGHT lower lobe infiltrate question pneumonia. Enlargement of cardiac silhouette post AICD with underlying COPD. Electronically Signed   By: Lavonia Dana M.D.   On: 09/30/2017 09:45      Scheduled Meds: . allopurinol  100 mg Oral Daily  . amiodarone  200 mg Oral Daily  . carvedilol  3.125 mg Oral BID WC  . clopidogrel  75 mg Oral Daily  . enoxaparin (LOVENOX) injection  40 mg Subcutaneous Q24H  . furosemide  60 mg Intravenous BID  . oxybutynin  5 mg Oral BID  . pravastatin  40 mg Oral Daily  . sertraline  50 mg Oral QHS   Continuous Infusions: . azithromycin Stopped (10/01/17 1157)  . cefTRIAXone (ROCEPHIN)  IV Stopped (10/01/17 1024)     LOS: 1 day   Time spent: 25 minutes   Faye Ramsay, MD [facility] Pager [phone]  If 7PM-7AM, please contact night-coverage [email or web address] Password [facility] 10/01/2017, 6:32 PM

## 2017-10-01 NOTE — Care Management Note (Signed)
Case Management Note  Patient Details  Name: [name] MRN: [id number] Date of Birth: 1931/09/11  Subjective/Objective:                  Sepsis with hx of chf  Action/Plan: Date: October 01, 2017 Velva Harman, BSN, [place], [place] Chart and notes review for patient progress and needs. Will follow for case management and discharge needs./home heart failure screening through [place] Next review date: [id number] Expected Discharge Date:  10/03/17               Expected Discharge Plan:  [place]  In-House Referral:     Discharge planning Services  CM Consult  Post Acute Care Choice:    Choice offered to:  Patient  DME Arranged:    DME Agency:     HH Arranged:  Disease Management [place] Agency:  [place]  Status of Service:  In process, will continue to follow  If discussed at Long Length of Stay Meetings, dates discussed:    Additional Comments:  Leeroy Cha, RN 10/01/2017, 8:05 AM

## 2017-10-02 ENCOUNTER — Inpatient Hospital Stay (HOSPITAL_COMMUNITY): Payer: Medicare Other

## 2017-10-02 LAB — BASIC METABOLIC PANEL
ANION GAP: 9 (ref 5–15)
BUN: 35 mg/dL — ABNORMAL HIGH (ref 6–20)
CALCIUM: 8.2 mg/dL — AB (ref 8.9–10.3)
CO2: 29 mmol/L (ref 22–32)
CREATININE: 0.96 mg/dL (ref 0.61–1.24)
Chloride: 94 mmol/L — ABNORMAL LOW (ref 101–111)
GLUCOSE: 132 mg/dL — AB (ref 65–99)
Potassium: 3 mmol/L — ABNORMAL LOW (ref 3.5–5.1)
Sodium: 132 mmol/L — ABNORMAL LOW (ref 135–145)

## 2017-10-02 LAB — CBC
HCT: 37.9 % — ABNORMAL LOW (ref 39.0–52.0)
Hemoglobin: 13.2 g/dL (ref 13.0–17.0)
MCH: 34.2 pg — ABNORMAL HIGH (ref 26.0–34.0)
MCHC: 34.8 g/dL (ref 30.0–36.0)
MCV: 98.2 fL (ref 78.0–100.0)
Platelets: 190 10*3/uL (ref 150–400)
RBC: 3.86 MIL/uL — ABNORMAL LOW (ref 4.22–5.81)
RDW: 12.9 % (ref 11.5–15.5)
WBC: 19.7 10*3/uL — AB (ref 4.0–10.5)

## 2017-10-02 LAB — MAGNESIUM: Magnesium: 1.8 mg/dL (ref 1.7–2.4)

## 2017-10-02 LAB — PROCALCITONIN: Procalcitonin: 1.88 ng/mL

## 2017-10-02 MED ORDER — RESOURCE THICKENUP CLEAR PO POWD
ORAL | Status: DC | PRN
  Filled 2017-10-02: qty 125

## 2017-10-02 MED ORDER — MAGNESIUM SULFATE 2 GM/50ML IV SOLN
2.0000 g | Freq: Once | INTRAVENOUS | Status: AC
  Administered 2017-10-02: 2 g via INTRAVENOUS
  Filled 2017-10-02: qty 50

## 2017-10-02 MED ORDER — POTASSIUM CHLORIDE CRYS ER 20 MEQ PO TBCR
40.0000 meq | EXTENDED_RELEASE_TABLET | Freq: Four times a day (QID) | ORAL | Status: AC
  Administered 2017-10-02 (×2): 40 meq via ORAL
  Filled 2017-10-02 (×2): qty 2
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
## 2017-10-02 NOTE — Progress Notes (Signed)
MBS completed - marginally difficult view noted due to pt's shoulder elevated thus frequently blocking larynx and UES, full report to follow.  Pt presents with moderate oral and severe pharyngo=cervical esophageal dysphagia with sensorimotor deficits.  He is grossly weak with poor pharyngeal motility resulting in gross residuals and poor laryngeal elevation/closure resulting in mild aspiration of thin liquids (subtle cough).  Cued effortful cough did not fully clear aspirates.  More importantly, gross residuals were not sensed by pt,  however he was able to propel portion of vallecular residuals from pharynx to oral cavity and with attempts to re=swallow WITHOUT awareness.  Pt finally cued to expectorate pudding from vallecular space after being unable to swallow to clear pharynx.  Chin tuck posture marginally helpful to decrease accumulation of residuals but pt will be chronic aspiration risk.  Again suspect pt with likely acute on chronic dysphagia and was able to manage prior to current medical event.    If pt/MD desires diet with mitigation strategies - at this time  Recommend Full liquids, nectar thick with chin tuck (use straw) Tsps thin water ok with head neutral Medicine crushed with Ensure Multiple Swallows with each bolus Cough/"hock" and expectorate  Luanna Salk, [place] [facility] SLP [phone]

## 2017-10-02 NOTE — Progress Notes (Addendum)
PROGRESS NOTE  [name]  VHQ:[id number] DOB: Jul 27, 1931 DOA: 09/30/2017 PCP: Noralee Space, MD  Brief Narrative:   83 y.o.malewith medical history significant ofchronic combined diastolic and systolic congestive heart failure, CAD, hypertension, hyperlipidemia, vascular disease who presented to the emergency department with complaints of increased cough and congestion started 2 weeks prior to hospital admission.  Chest x-ray demonstrated a right lower lobe pneumonia and right pleural effusion.  He was started on antibiotics for community-acquired pneumonia and IV diuretics.  He continues to feel unwell.  Nursing staff is noted that he has problems with swallowing and a modified barium swallow has been ordered for today.  Assessment & Plan:   Principal Problem:   Acute hypoxemic respiratory failure (HCC) Active Problems:   Essential hypertension   Carotid arterial disease (HCC)   CAP (community acquired pneumonia)   Cardiomyopathy, ischemic   Subdural hematoma (HCC)   Traumatic subdural hematoma with loss of consciousness (HCC)   Pulmonary hypertension (HCC)   TBI (traumatic brain injury) ([place])   Thrombocytopenia ([place])   Hypertensive heart disease with heart failure (HCC)   Chronic combined systolic and diastolic CHF (congestive heart failure) (HCC)   COPD with acute bronchitis (HCC)   CHF exacerbation (HCC)   Pressure injury of skin  1. Acute hypoxemic respiratory failure 1. Initially had beenon 2LNC with increased tachypnea 2. Suspect due to acute on chronic combined systolic and diastolic CHF, PNA 3. Continue Antibiotics Zithromax and Rocephin day #3 4. Continue Lasix: -900 mL over last 24 hours 5. Repeat CXR in AM 2. HTN 1. BP stable  2. Cont home medical regimen  3. CAP, RLL, pro-calcitonin elevated.  Signs of aspiration 1. CXR findings reviewed. Evidence of PNA with effusion 2. Cont on azithromycin and rocephin 3. Blood cultures no growth to  date 4. Leukocytosis worsening 5. MRSA PCR negative 6. CBC in AM 7. Speech evaluation appreciated.  Modified barium swallow pending 4. Acute on chronic combined sys and dias chf exacerbation 1. Most recent 2d echo with EF 30-35% 2. Presenting BNP of 1348, was 496 on 01/22/17 3. Continue IV Lasix  5. COPD 1. No wheezing at this time  6. Hx subdural hematoma 1. Stable at this time  7. Prolonged QTc, paced rhythm 1. QTc of 527. Would avoid QT prolonging medications 8. Hypokalemia, likely due to diuretics and given oral potassium supplementation.  Checked magnesium level it was 1.8.  Given 2 g of IV magnesium sulfate.  DVT prophylaxis: Lovenox SQ Code Status: Full  Family Communication: Patient at bedside, no family at bedside Disposition Plan: To be determined    Consultants:   None  Procedures:   None  Antimicrobials:  Anti-infectives (From admission, onward)   Start     Dose/Rate Route Frequency Ordered Stop   10/01/17 1100  azithromycin (ZITHROMAX) 500 mg in dextrose 5 % 250 mL IVPB     500 mg 250 mL/hr over 60 Minutes Intravenous Every 24 hours 09/30/17 1148     10/01/17 1000  cefTRIAXone (ROCEPHIN) 1 g in dextrose 5 % 50 mL IVPB     1 g 100 mL/hr over 30 Minutes Intravenous Every 24 hours 09/30/17 1148     09/30/17 1015  cefTRIAXone (ROCEPHIN) 1 g in dextrose 5 % 50 mL IVPB     1 g 100 mL/hr over 30 Minutes Intravenous  Once 09/30/17 1006 09/30/17 1056   09/30/17 1015  azithromycin (ZITHROMAX) 500 mg in dextrose 5 % 250 mL IVPB  500 mg 250 mL/hr over 60 Minutes Intravenous  Once 09/30/17 1006 09/30/17 1212       Subjective: Patient states that he feels about the same as yesterday.  He continues to have cough, wheezing.  He denies fevers or chills.  Objective: Vitals:   10/02/17 0600 10/02/17 0700 10/02/17 0800 10/02/17 0900  BP: 120/61 (!) 132/58 124/67 135/62  Pulse: 61 60 62 63  Resp: (!) 21 (!) 22 (!) 23 17  Temp:   98.6 F (37 C)   TempSrc:   Oral    SpO2: 93% 91% 94% 96%  Weight: 76.3 kg (168 lb 3.4 oz)     Height:        Intake/Output Summary (Last 24 hours) at 10/02/2017 1246 Last data filed at 10/02/2017 0900 Gross per 24 hour  Intake 350 ml  Output 1730 ml  Net -1380 ml   Filed Weights   09/30/17 0849 10/02/17 0600  Weight: 77.1 kg (170 lb) 76.3 kg (168 lb 3.4 oz)    Examination:  General exam:  Adult male, nasal cannula in place.  No acute distress.  HEENT:  NCAT, MMM Respiratory system: Diminished at the right base and right mid back, some rales in the same location and diffuse wheezing, her particularly when he was coughing  cardiovascular system: Regular rate and rhythm, normal S1/S2. No murmurs, rubs, gallops or clicks.  Warm extremities Gastrointestinal system: Normal active bowel sounds, soft, nondistended, nontender. MSK:  Normal tone and bulk, 2+ pitting bilateral lower extremity edema Neuro:  Grossly intact    Data Reviewed: I have personally reviewed following labs and imaging studies  CBC: Recent Labs  Lab 09/26/17 1411 09/30/17 0956 09/30/17 1320 10/01/17 0335 10/02/17 0310  WBC 11.4* 13.6* 15.7* 15.7* 19.7*  HGB 13.7 13.6 13.7 13.4 13.2  HCT 38.7 39.4 38.6* 37.9* 37.9*  MCV 98* 99.2 98.5 96.7 98.2  PLT 119* 169 189 168 638   Basic Metabolic Panel: Recent Labs  Lab 09/26/17 1411 09/30/17 0956 09/30/17 1320 10/01/17 0335 10/02/17 0310  NA 137 133*  --  131* 132*  K 4.3 4.4  --  3.2* 3.0*  CL 97 99*  --  93* 94*  CO2 25 23  --  26 29  GLUCOSE 99 112*  --  110* 132*  BUN 32* 35*  --  32* 35*  CREATININE 1.12 0.98 0.96 1.00 0.96  CALCIUM 9.0 8.7*  --  8.1* 8.2*  MG  --   --   --   --  1.8   GFR: Estimated Creatinine Clearance: 61 mL/min (by C-G formula based on SCr of 0.96 mg/dL). Liver Function Tests: Recent Labs  Lab 10/01/17 0335  AST 39  ALT 27  ALKPHOS 69  BILITOT 1.1  PROT 6.5  ALBUMIN 2.6*   No results for input(s): LIPASE, AMYLASE in the last 168 hours. No  results for input(s): AMMONIA in the last 168 hours. Coagulation Profile: No results for input(s): INR, PROTIME in the last 168 hours. Cardiac Enzymes: No results for input(s): CKTOTAL, CKMB, CKMBINDEX, TROPONINI in the last 168 hours. BNP (last 3 results) Recent Labs    01/22/17 1231  PROBNP 496.0*   HbA1C: No results for input(s): HGBA1C in the last 72 hours. CBG: No results for input(s): GLUCAP in the last 168 hours. Lipid Profile: No results for input(s): CHOL, HDL, LDLCALC, TRIG, CHOLHDL, LDLDIRECT in the last 72 hours. Thyroid Function Tests: No results for input(s): TSH, T4TOTAL, FREET4, T3FREE, THYROIDAB in the last  72 hours. Anemia Panel: No results for input(s): VITAMINB12, FOLATE, FERRITIN, TIBC, IRON, RETICCTPCT in the last 72 hours. Urine analysis:    Component Value Date/Time   COLORURINE YELLOW 04/14/2016 1028   APPEARANCEUR CLEAR 04/14/2016 1028   LABSPEC 1.013 04/14/2016 1028   PHURINE 6.0 04/14/2016 1028   GLUCOSEU NEGATIVE 04/14/2016 1028   HGBUR NEGATIVE 04/14/2016 1028   BILIRUBINUR NEGATIVE 04/14/2016 1028   KETONESUR NEGATIVE 04/14/2016 1028   PROTEINUR NEGATIVE 04/14/2016 1028   UROBILINOGEN 2.0 (H) 10/28/2012 0820   NITRITE NEGATIVE 04/14/2016 1028   LEUKOCYTESUR NEGATIVE 04/14/2016 1028   Sepsis Labs: @LABRCNTIP (procalcitonin:4,lacticidven:4)  ) Recent Results (from the past 240 hour(s))  Culture, blood (routine x 2) Call MD if unable to obtain prior to antibiotics being given     Status: None (Preliminary result)   Collection Time: 09/30/17  1:37 PM  Result Value Ref Range Status   Specimen Description BLOOD RIGHT FOREARM  Final   Special Requests   Final    BOTTLES DRAWN AEROBIC AND ANAEROBIC Blood Culture adequate volume   Culture   Final    NO GROWTH < 24 HOURS Performed at [facility], [place] [address], [place], [place] [address]    Report Status PENDING  Incomplete  MRSA PCR Screening     Status: None   Collection Time:  09/30/17  7:17 PM  Result Value Ref Range Status   MRSA by PCR NEGATIVE NEGATIVE Final    Comment:        The GeneXpert MRSA Assay (FDA approved for NASAL specimens only), is one component of a comprehensive MRSA colonization surveillance program. It is not intended to diagnose MRSA infection nor to guide or monitor treatment for MRSA infections.   Culture, blood (routine x 2) Call MD if unable to obtain prior to antibiotics being given     Status: None (Preliminary result)   Collection Time: 09/30/17  7:39 PM  Result Value Ref Range Status   Specimen Description BLOOD RIGHT ANTECUBITAL  Final   Special Requests IN PEDIATRIC BOTTLE Blood Culture adequate volume  Final   Culture   Final    NO GROWTH < 24 HOURS Performed at [facility], [address] [place],  [address]    Report Status PENDING  Incomplete      Radiology Studies: No results found.   Scheduled Meds: . allopurinol  100 mg Oral Daily  . amiodarone  200 mg Oral Daily  . carvedilol  3.125 mg Oral BID WC  . clopidogrel  75 mg Oral Daily  . enoxaparin (LOVENOX) injection  40 mg Subcutaneous Q24H  . furosemide  60 mg Intravenous BID  . oxybutynin  5 mg Oral BID  . potassium chloride  40 mEq Oral Q6H  . pravastatin  40 mg Oral Daily  . sertraline  50 mg Oral QHS   Continuous Infusions: . azithromycin Stopped (10/01/17 1157)  . cefTRIAXone (ROCEPHIN)  IV Stopped (10/02/17 1215)  . magnesium sulfate 1 - 4 g bolus IVPB       LOS: 2 days    Time spent: 30 min    Janece Canterbury, MD [facility] Pager [phone]  If 7PM-7AM, please contact night-coverage [email or web address] Password [facility] 10/02/2017, 12:46 PM

## 2017-10-03 ENCOUNTER — Inpatient Hospital Stay (HOSPITAL_COMMUNITY): Payer: Medicare Other

## 2017-10-03 LAB — CBC
HCT: 40.2 % (ref 39.0–52.0)
Hemoglobin: 14.3 g/dL (ref 13.0–17.0)
MCH: 34.6 pg — AB (ref 26.0–34.0)
MCHC: 35.6 g/dL (ref 30.0–36.0)
MCV: 97.3 fL (ref 78.0–100.0)
Platelets: 203 10*3/uL (ref 150–400)
RBC: 4.13 MIL/uL — ABNORMAL LOW (ref 4.22–5.81)
RDW: 12.9 % (ref 11.5–15.5)
WBC: 17.7 10*3/uL — AB (ref 4.0–10.5)

## 2017-10-03 LAB — BASIC METABOLIC PANEL
ANION GAP: 13 (ref 5–15)
BUN: 40 mg/dL — ABNORMAL HIGH (ref 6–20)
CHLORIDE: 94 mmol/L — AB (ref 101–111)
CO2: 24 mmol/L (ref 22–32)
CREATININE: 0.95 mg/dL (ref 0.61–1.24)
Calcium: 8.1 mg/dL — ABNORMAL LOW (ref 8.9–10.3)
GFR calc non Af Amer: >60 mL/min (ref >60)
Glucose, Bld: 141 mg/dL — ABNORMAL HIGH (ref 65–99)
Potassium: 3.7 mmol/L (ref 3.5–5.1)
SODIUM: 131 mmol/L — AB (ref 135–145)

## 2017-10-03 LAB — PROCALCITONIN: Procalcitonin: 1.14 ng/mL

## 2017-10-03 MED ORDER — IOPAMIDOL (ISOVUE-300) INJECTION 61%
75.0000 mL | Freq: Once | INTRAVENOUS | Status: AC | PRN
  Administered 2017-10-03: 75 mL via INTRAVENOUS

## 2017-10-03 MED ORDER — FUROSEMIDE 10 MG/ML IJ SOLN
80.0000 mg | Freq: Two times a day (BID) | INTRAMUSCULAR | Status: DC
  Administered 2017-10-03: 80 mg via INTRAVENOUS
  Filled 2017-10-03: qty 8

## 2017-10-03 MED ORDER — IOPAMIDOL (ISOVUE-300) INJECTION 61%
INTRAVENOUS | Status: AC
  Filled 2017-10-03: qty 75

## 2017-10-03 MED ORDER — ORAL CARE MOUTH RINSE
15.0000 mL | Freq: Two times a day (BID) | OROMUCOSAL | Status: DC
  Administered 2017-10-03 – 2017-10-16 (×25): 15 mL via OROMUCOSAL
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
## 2017-10-03 NOTE — Evaluation (Signed)
Physical Therapy Evaluation Patient Details Name: [name] MRN: [id number] DOB: 17-Oct-1931 Today's Date: 10/03/2017   History of Present Illness  83 y.o. 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 Pt currently with functional limitations due to the deficits listed below (see PT Problem List).  Pt will benefit from skilled PT to increase their independence and safety with mobility to allow discharge to the venue listed below.  Pt assisted OOB to [facility] and then over to recliner.  Pt fatigued quickly.  SPO2 91% on room air (reapplied 2L O2 [place] (was on 3.5L upon entering room) upon sitting in recliner and notified RN).  Pt from home alone with his dog, "Lil bit."  Pt may benefit from SNF upon d/c if he is slow to progress.     Follow Up Recommendations SNF;Supervision/Assistance - 24 hour    Equipment Recommendations  Rolling walker with 5" wheels    Recommendations for Other Services       Precautions / Restrictions Precautions Precautions: Fall      Mobility  Bed Mobility Overal bed mobility: Needs Assistance Bed Mobility: Supine to Sit     Supine to sit: Min guard;HOB elevated     General bed mobility comments: provided a hand for pt to self assist trunk upright  Transfers Overall transfer level: Needs assistance Equipment used: Rolling walker (2 wheeled) Transfers: Sit to/from [facility] Sit to Stand: Mod assist Stand pivot transfers: Mod assist       General transfer comment: verbal cues for hand placement and safe technique, requested BSC and then took a few steps from [facility] to recliner, declined ambulating due to  fatigue  Ambulation/Gait                Stairs            Wheelchair Mobility    Modified Rankin (Stroke Patients Only)       Balance                                             Pertinent Vitals/Pain Pain Assessment: No/denies pain    Home Living Family/patient expects to be discharged to:: Private residence Living Arrangements: Alone Available Help at Discharge: Family;Available 24 hours/day Type of Home: House Home Access: Stairs to enter Entrance Stairs-Rails: Right Entrance Stairs-Number of Steps: 2 Home Layout: One level Home Equipment: Walker - 2 wheels Additional Comments: has dog named "Lil bit"    Prior Function Level of Independence: Independent with assistive device(s)               Hand Dominance        Extremity/Trunk Assessment        Lower Extremity Assessment Lower Extremity Assessment: Generalized weakness       Communication   Communication: HOH(bil hearing aides)  Cognition Arousal/Alertness: Awake/alert Behavior During Therapy: WFL for tasks assessed/performed Overall Cognitive Status: Within Functional Limits for tasks assessed  General Comments      Exercises     Assessment/Plan    PT Assessment Patient needs continued PT services  PT Problem List Decreased strength;Decreased mobility;Decreased activity tolerance;Decreased balance;Decreased knowledge of use of DME       PT Treatment Interventions DME instruction;Gait training;Therapeutic exercise;Functional mobility training;Balance training;Therapeutic activities;Patient/family education    PT Goals (Current goals can be found in the Care Plan section)  Acute Rehab PT Goals PT Goal Formulation: With patient Time For Goal Achievement: 10/17/17 Potential to Achieve Goals: Good    Frequency Min 3X/week   Barriers to discharge        Co-evaluation                AM-PAC PT "6 Clicks" Daily Activity  Outcome Measure Difficulty turning over in bed (including adjusting bedclothes, sheets and blankets)?: None Difficulty moving from lying on back to sitting on the side of the bed? : A Little Difficulty sitting down on and standing up from a chair with arms (e.g., wheelchair, bedside commode, etc,.)?: Unable Help needed moving to and from a bed to chair (including a wheelchair)?: A Little Help needed walking in hospital room?: A Lot Help needed climbing 3-5 steps with a railing? : Total 6 Click Score: 14    End of Session Equipment Utilized During Treatment: Gait belt Activity Tolerance: Patient tolerated treatment well Patient left: in chair;with call bell/phone within reach;with nursing/sitter in room Nurse Communication: Mobility status PT Visit Diagnosis: Difficulty in walking, not elsewhere classified (R26.2)    Time: 5929-2446 PT Time Calculation (min) (ACUTE ONLY): 15 min   Charges:   PT Evaluation $PT Eval Low Complexity: 1 Low     PT G CodesCarmelia Bake, PT, DPT 10/03/2017 Pager: [phone]  York Ram E 10/03/2017, 12:14 PM

## 2017-10-04 ENCOUNTER — Inpatient Hospital Stay (HOSPITAL_COMMUNITY): Payer: Medicare Other

## 2017-10-04 LAB — CBC
HCT: 39.1 % (ref 39.0–52.0)
HEMOGLOBIN: 13.6 g/dL (ref 13.0–17.0)
MCH: 33.7 pg (ref 26.0–34.0)
MCHC: 34.8 g/dL (ref 30.0–36.0)
MCV: 97 fL (ref 78.0–100.0)
PLATELETS: 267 10*3/uL (ref 150–400)
RBC: 4.03 MIL/uL — ABNORMAL LOW (ref 4.22–5.81)
RDW: 12.9 % (ref 11.5–15.5)
WBC: 19.6 10*3/uL — ABNORMAL HIGH (ref 4.0–10.5)

## 2017-10-04 LAB — GLUCOSE, PLEURAL OR PERITONEAL FLUID: Glucose, Fluid: 85 mg/dL

## 2017-10-04 LAB — BASIC METABOLIC PANEL
ANION GAP: 8 (ref 5–15)
BUN: 43 mg/dL — ABNORMAL HIGH (ref 6–20)
CALCIUM: 8.2 mg/dL — AB (ref 8.9–10.3)
CO2: 29 mmol/L (ref 22–32)
CREATININE: 1.02 mg/dL (ref 0.61–1.24)
Chloride: 95 mmol/L — ABNORMAL LOW (ref 101–111)
GFR calc Af Amer: >60 mL/min (ref >60)
GLUCOSE: 149 mg/dL — AB (ref 65–99)
Potassium: 2.9 mmol/L — ABNORMAL LOW (ref 3.5–5.1)
Sodium: 132 mmol/L — ABNORMAL LOW (ref 135–145)

## 2017-10-04 LAB — BODY FLUID CELL COUNT WITH DIFFERENTIAL
LYMPHS FL: 2 %
MONOCYTE-MACROPHAGE-SEROUS FLUID: 16 % — AB (ref 50–90)
Neutrophil Count, Fluid: 82 % — ABNORMAL HIGH (ref 0–25)
Total Nucleated Cell Count, Fluid: 1175 cu mm — ABNORMAL HIGH (ref 0–1000)

## 2017-10-04 LAB — LACTATE DEHYDROGENASE, PLEURAL OR PERITONEAL FLUID: LD FL: 984 U/L — AB (ref 3–23)

## 2017-10-04 LAB — PROTEIN, PLEURAL OR PERITONEAL FLUID: TOTAL PROTEIN, FLUID: 3.7 g/dL

## 2017-10-04 LAB — MAGNESIUM: MAGNESIUM: 2 mg/dL (ref 1.7–2.4)

## 2017-10-04 LAB — PROCALCITONIN: PROCALCITONIN: 0.65 ng/mL

## 2017-10-04 MED ORDER — LIDOCAINE HCL 1 % IJ SOLN
INTRAMUSCULAR | Status: AC
  Filled 2017-10-04: qty 20

## 2017-10-04 MED ORDER — FUROSEMIDE 40 MG PO TABS
40.0000 mg | ORAL_TABLET | Freq: Every day | ORAL | Status: DC
  Administered 2017-10-04 – 2017-10-12 (×9): 40 mg via ORAL
  Filled 2017-10-04 (×9): qty 1

## 2017-10-04 MED ORDER — POTASSIUM CHLORIDE CRYS ER 20 MEQ PO TBCR
40.0000 meq | EXTENDED_RELEASE_TABLET | ORAL | Status: AC
Start: 2017-10-04 — End: 2017-10-04
  Administered 2017-10-04 (×3): 40 meq via ORAL
  Filled 2017-10-04 (×3): qty 2
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
## 2017-10-04 NOTE — Progress Notes (Signed)
Date:  October 04, 2017 Chart reviewed for concurrent status and case management needs.  Will continue to follow patient progress.  Discharge Planning: following for needs  Expected discharge date: October 07, 2017  Velva Harman, BSN, [place], [place]
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
## 2017-10-04 NOTE — Procedures (Addendum)
Ultrasound-guided diagnostic and therapeutic right thoracentesis performed yielding 1.4 liters of hazy, yellow fluid. No immediate complications. Follow-up chest x-ray pending.The fluid was sent to the lab for preordered studies. The pleural collection was multiloculated which prevented complete evacuation of fluid with small amount free fluid remaining after procedure today.

## 2017-10-04 NOTE — Progress Notes (Signed)
PROGRESS NOTE  [name]  HUT:[id number] DOB: Oct 15, 1931 DOA: 09/30/2017 PCP: Noralee Space, MD  Brief Narrative:   83 y.o.malewith medical history significant ofchronic combined diastolic and systolic congestive heart failure, CAD, hypertension, hyperlipidemia, vascular disease who presented to the emergency department with complaints of increased cough and congestion started 2 weeks prior to hospital admission.  Chest x-ray demonstrated a right lower lobe pneumonia and right pleural effusion.  He was started on antibiotics for community-acquired pneumonia and IV diuretics however he has had increasing oxygen requirements and his chest x-ray shows that his effusion is worsening despite diuresis.  CT chest demonstrated a moderate right-sided pleural effusion without loculation.  There are areas of hypoattenuation in the right lung that could represent pneumonia, necrosis, or possible neoplasm.  There was also a possible mucoid impaction at the left apex.  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  He is awaiting thoracentesis today.  Assessment & Plan:   Principal Problem:   Acute hypoxemic respiratory failure (HCC) Active Problems:   Essential hypertension   Carotid arterial disease (HCC)   CAP (community acquired pneumonia)   Cardiomyopathy, ischemic   Subdural hematoma (HCC)   Traumatic subdural hematoma with loss of consciousness (HCC)   Pulmonary hypertension (HCC)   TBI (traumatic brain injury) ([place])   Thrombocytopenia ([place])   Hypertensive heart disease with heart failure (HCC)   Chronic combined systolic and diastolic CHF (congestive heart failure) (HCC)   COPD with acute bronchitis (HCC)   CHF exacerbation (HCC)   Pressure injury of skin  1. Acute hypoxemic respiratory failure, oxygen has been decreased to 2 L 1. Suspect due to acute on chronic combined systolic and diastolic CHF,  PNA 2. Continue Antibiotics Zithromax and Rocephin day #5 3. Continue Lasix: -910 mL over last 24 hours 4. BUN is rising as his creatinine, change to oral Lasix 5. Thoracentesis pending 2. HTN 1. BP stable  2. Cont home regimen  3. CAP vs. Aspiration pneumonia, RLL  1. CXR findings reviewed. Evidence of PNA with effusion 2. Cont on azithromycin and rocephin 3. Blood cultures no growth to date 4. MRSA PCR negative 4. Dysphagia 1. Aspiration precautions 2. Diet advanced by speech therapy today to dysphagia 3 3. Appreciate speech therapy assistance 5. Acute on chronic combined sys and dias chf exacerbation 1. Most recent 2d echo with EF 30-35% 2. Presenting BNP of 1348, was 496 on 01/22/17 3. Transitioning to oral Lasix today 6. COPD 1. No wheezing at this time  7. Hx subdural hematoma 1. Stable at this time  8. Prolonged QTc, paced rhythm 1. QTc of 527. Would avoid QT prolonging medications 9. Hypokalemia secondary to diuretics, oral potassium repletion 10. Generalized weakness, PT evaluation, out of bed 11. Persistent leukocytosis may be secondary to pneumonia, inflammation, malignancy  DVT prophylaxis: Lovenox SQ Code Status: Full  Family Communication: Patient at bedside, no family at bedside Disposition Plan:  to SNF   Consultants:   None  Procedures:   None  Antimicrobials:  Anti-infectives (From admission, onward)   Start     Dose/Rate Route Frequency Ordered Stop   10/01/17 1100  azithromycin (ZITHROMAX) 500 mg in dextrose 5 % 250 mL IVPB     500 mg 250 mL/hr over 60 Minutes Intravenous Every 24 hours 09/30/17 1148     10/01/17 1000  cefTRIAXone (ROCEPHIN) 1 g in dextrose 5 % 50 mL IVPB  1 g 100 mL/hr over 30 Minutes Intravenous Every 24 hours 09/30/17 1148     09/30/17 1015  cefTRIAXone (ROCEPHIN) 1 g in dextrose 5 % 50 mL IVPB     1 g 100 mL/hr over 30 Minutes Intravenous  Once 09/30/17 1006 09/30/17 1056   09/30/17 1015  azithromycin (ZITHROMAX) 500  mg in dextrose 5 % 250 mL IVPB     500 mg 250 mL/hr over 60 Minutes Intravenous  Once 09/30/17 1006 09/30/17 1212       Subjective:  He is coughing up thick phlegm, overall he thinks he feels a little better.  He denies any wheezing or subjective shortness of breath.  He would like to eat more solid foods.  Bowels have been moving, no problems with urination  Objective: Vitals:   10/04/17 0758 10/04/17 0800 10/04/17 1153 10/04/17 1239  BP:  (!) 152/68  (!) 115/59  Pulse:  60  61  Resp:  20  18  Temp: 99 F (37.2 C)  99.1 F (37.3 C) 98.8 F (37.1 C)  TempSrc: Oral  Oral Oral  SpO2:  91%  91%  Weight:    75.2 kg (165 lb 12.6 oz)  Height:    5\' 11"  (1.803 m)    Intake/Output Summary (Last 24 hours) at 10/04/2017 1352 Last data filed at 10/04/2017 0600 Gross per 24 hour  Intake 360 ml  Output 1700 ml  Net -1340 ml   Filed Weights   10/02/17 0600 10/04/17 0500 10/04/17 1239  Weight: 76.3 kg (168 lb 3.4 oz) 77.6 kg (171 lb 1.2 oz) 75.2 kg (165 lb 12.6 oz)    Examination:  General exam:  Adult male, nasal cannula in place.  No acute distress.  HEENT:  NCAT, MMM Respiratory system: Clear to [facility] throughout the right lung fields  cardiovascular system: Regular rate and rhythm, normal S1/S2.  3 Out of 6 systolic murmur.  Warm extremities Gastrointestinal system: Normal active bowel sounds, soft, nondistended, nontender. MSK:  Normal tone and bulk, 1+ pitting bilateral lower extremity edema Neuro:  Grossly intact  Data Reviewed: I have personally reviewed following labs and imaging studies  CBC: Recent Labs  Lab 09/30/17 1320 10/01/17 0335 10/02/17 0310 10/03/17 0256 10/04/17 0318  WBC 15.7* 15.7* 19.7* 17.7* 19.6*  HGB 13.7 13.4 13.2 14.3 13.6  HCT 38.6* 37.9* 37.9* 40.2 39.1  MCV 98.5 96.7 98.2 97.3 97.0  PLT 189 168 190 203 299   Basic Metabolic Panel: Recent Labs  Lab 09/30/17 0956 09/30/17 1320 10/01/17 0335 10/02/17 0310 10/03/17 0256  10/04/17 0318  NA 133*  --  131* 132* 131* 132*  K 4.4  --  3.2* 3.0* 3.7 2.9*  CL 99*  --  93* 94* 94* 95*  CO2 23  --  26 29 24 29   GLUCOSE 112*  --  110* 132* 141* 149*  BUN 35*  --  32* 35* 40* 43*  CREATININE 0.98 0.96 1.00 0.96 0.95 1.02  CALCIUM 8.7*  --  8.1* 8.2* 8.1* 8.2*  MG  --   --   --  1.8  --  2.0   GFR: Estimated Creatinine Clearance: 57.3 mL/min (by C-G formula based on SCr of 1.02 mg/dL). Liver Function Tests: Recent Labs  Lab 10/01/17 0335  AST 39  ALT 27  ALKPHOS 69  BILITOT 1.1  PROT 6.5  ALBUMIN 2.6*   No results for input(s): LIPASE, AMYLASE in the last 168 hours. No results for input(s): AMMONIA in the last  168 hours. Coagulation Profile: No results for input(s): INR, PROTIME in the last 168 hours. Cardiac Enzymes: No results for input(s): CKTOTAL, CKMB, CKMBINDEX, TROPONINI in the last 168 hours. BNP (last 3 results) Recent Labs    01/22/17 1231  PROBNP 496.0*   HbA1C: No results for input(s): HGBA1C in the last 72 hours. CBG: No results for input(s): GLUCAP in the last 168 hours. Lipid Profile: No results for input(s): CHOL, HDL, LDLCALC, TRIG, CHOLHDL, LDLDIRECT in the last 72 hours. Thyroid Function Tests: No results for input(s): TSH, T4TOTAL, FREET4, T3FREE, THYROIDAB in the last 72 hours. Anemia Panel: No results for input(s): VITAMINB12, FOLATE, FERRITIN, TIBC, IRON, RETICCTPCT in the last 72 hours. Urine analysis:    Component Value Date/Time   COLORURINE YELLOW 04/14/2016 1028   APPEARANCEUR CLEAR 04/14/2016 1028   LABSPEC 1.013 04/14/2016 1028   PHURINE 6.0 04/14/2016 1028   GLUCOSEU NEGATIVE 04/14/2016 1028   HGBUR NEGATIVE 04/14/2016 1028   BILIRUBINUR NEGATIVE 04/14/2016 1028   KETONESUR NEGATIVE 04/14/2016 1028   PROTEINUR NEGATIVE 04/14/2016 1028   UROBILINOGEN 2.0 (H) 10/28/2012 0820   NITRITE NEGATIVE 04/14/2016 1028   LEUKOCYTESUR NEGATIVE 04/14/2016 1028   Sepsis  Labs: @LABRCNTIP (procalcitonin:4,lacticidven:4)  ) Recent Results (from the past 240 hour(s))  Culture, blood (routine x 2) Call MD if unable to obtain prior to antibiotics being given     Status: None (Preliminary result)   Collection Time: 09/30/17  1:37 PM  Result Value Ref Range Status   Specimen Description BLOOD RIGHT FOREARM  Final   Special Requests   Final    BOTTLES DRAWN AEROBIC AND ANAEROBIC Blood Culture adequate volume   Culture   Final    NO GROWTH 4 DAYS Performed at [facility], [place] [address], [place], [place] [address]    Report Status PENDING  Incomplete  MRSA PCR Screening     Status: None   Collection Time: 09/30/17  7:17 PM  Result Value Ref Range Status   MRSA by PCR NEGATIVE NEGATIVE Final    Comment:        The GeneXpert MRSA Assay (FDA approved for NASAL specimens only), is one component of a comprehensive MRSA colonization surveillance program. It is not intended to diagnose MRSA infection nor to guide or monitor treatment for MRSA infections.   Culture, blood (routine x 2) Call MD if unable to obtain prior to antibiotics being given     Status: None (Preliminary result)   Collection Time: 09/30/17  7:39 PM  Result Value Ref Range Status   Specimen Description BLOOD RIGHT ANTECUBITAL  Final   Special Requests IN PEDIATRIC BOTTLE Blood Culture adequate volume  Final   Culture   Final    NO GROWTH 4 DAYS Performed at [facility], [place] [address], [place], [place] [address]    Report Status PENDING  Incomplete      Radiology Studies: Ct Chest W Contrast  Result Date: 10/03/2017 CLINICAL DATA:  Congestive heart failure. Hypertension. Hyperlipidemia. Cough and congestion for 2 weeks. Right pleural effusion. EXAM: CT CHEST WITH CONTRAST TECHNIQUE: Multidetector CT imaging of the chest was performed during intravenous contrast administration. CONTRAST:  67mL ISOVUE-300 IOPAMIDOL (ISOVUE-300) INJECTION 61% COMPARISON:  Plain films,  including earlier today. Most recent CT 03/29/2016. FINDINGS: Cardiovascular: Advanced aortic and branch vessel atherosclerosis. Tortuous thoracic aorta. Focal outpouching in the descending aorta at the 5 o'clock position measures 20 x 11 mm on image 144/series 2. Compare similar on the prior. Mild cardiomegaly.  Pacer/AICD device with leads at right atrium and right ventricle. Mediastinum/Nodes: No mediastinal or hilar adenopathy. Lungs/Pleura: Moderate right-sided pleural effusion. No significant loculation. Compression versus obstruction of the bronchus intermedius with volume loss in the right lower lobe and right middle lobes. No well-defined obstructive mass. There is vague hypoattenuation in the region of the central right lower lobe, including on image 86/series 2. Areas of more peripheral right lower lobe hypoattenuation or hypoenhancement, including anteriorly on image 107/series 2. Mild to moderate centrilobular emphysema. 2 mm low posterior left apical pulmonary nodule is similar on image 32/series 5. Adjacent probable mucoid impaction in the left apex at maximally 6 mm, new. Example image 36/series 5. Upper Abdomen: Normal imaged portions of the liver, spleen, stomach, pancreas, gallbladder, adrenal glands, left kidney. Musculoskeletal: Remote right-sided rib fractures. Vertebral augmentation at T12. Mild T10 compression deformity is similar. IMPRESSION: 1. Moderate right-sided pleural effusion, without loculation. 2. Bronchus intermedius compression or obstruction. Resultant right middle lobe and right lower lobe volume loss. Areas of vague hypoattenuation or hypoenhancement within the right lower lobe. Although these areas could represent pneumonia with necrosis, concurrent neoplastic pulmonary nodule or nodules cannot be excluded. Consider further evaluation with bronchoscopy with special attention to the right lower lobe bronchus. Depending on the results of this exam, potential clinical strategies  include right-sided thoracentesis, antibiotics, and CT follow-up. If findings persist, outpatient PET may be necessary. 3.  Emphysema (ICD10-J43.9). 4. Probable mucoid impaction at the left apex. Recommend attention on follow-up. 5. Coronary artery atherosclerosis. Aortic Atherosclerosis (ICD10-I70.0). 6. 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 Electronically Signed   By: Abigail Miyamoto M.D.   On: 10/03/2017 15:28   Dg Chest Port 1 View  Result Date: 10/03/2017 CLINICAL DATA:  Pleural effusion. EXAM: PORTABLE CHEST 1 VIEW COMPARISON:  09/30/2017.  01/22/2017. FINDINGS: Cardiac pacer noted lead tips over the right atrium right ventricle. Cardiomegaly. No pulmonary venous congestion. Progressive right-sided pleural effusion. Underlying pulmonary infiltrate cannot be excluded. No pneumothorax. IMPRESSION: 1. Progressive right-sided pleural effusion. Underlying pulmonary infiltrate cannot be excluded. 2. Cardiac pacer stable position. Stable cardiomegaly. No pulmonary venous congestion. Electronically Signed   By: Perry   On: 10/03/2017 06:45     Scheduled Meds: . allopurinol  100 mg Oral Daily  . amiodarone  200 mg Oral Daily  . carvedilol  3.125 mg Oral BID WC  . clopidogrel  75 mg Oral Daily  . enoxaparin (LOVENOX) injection  40 mg Subcutaneous Q24H  . furosemide  40 mg Oral Daily  . mouth rinse  15 mL Mouth Rinse BID  . oxybutynin  5 mg Oral BID  . potassium chloride  40 mEq Oral Q4H  . pravastatin  40 mg Oral Daily  . sertraline  50 mg Oral QHS   Continuous Infusions: . azithromycin Stopped (10/04/17 1253)  . cefTRIAXone (ROCEPHIN)  IV Stopped (10/04/17 1047)     LOS: 4 days    Time spent: 30 min    Janece Canterbury, MD [facility] Pager [phone]  If 7PM-7AM, please contact night-coverage [email or web address] Password [email or web address] 10/04/2017, 1:52 PM

## 2017-10-05 ENCOUNTER — Encounter: Payer: Self-pay | Admitting: Pulmonary Disease

## 2017-10-05 DIAGNOSIS — J9601 Acute respiratory failure with hypoxia: Secondary | ICD-10-CM

## 2017-10-05 DIAGNOSIS — I255 Ischemic cardiomyopathy: Secondary | ICD-10-CM

## 2017-10-05 DIAGNOSIS — J181 Lobar pneumonia, unspecified organism: Secondary | ICD-10-CM

## 2017-10-05 LAB — COMPREHENSIVE METABOLIC PANEL
ALK PHOS: 72 U/L (ref 38–126)
ALT: 88 U/L — AB (ref 17–63)
AST: 101 U/L — ABNORMAL HIGH (ref 15–41)
Albumin: 2.1 g/dL — ABNORMAL LOW (ref 3.5–5.0)
Anion gap: 10 (ref 5–15)
BUN: 40 mg/dL — ABNORMAL HIGH (ref 6–20)
CALCIUM: 8.3 mg/dL — AB (ref 8.9–10.3)
CO2: 29 mmol/L (ref 22–32)
CREATININE: 0.96 mg/dL (ref 0.61–1.24)
Chloride: 95 mmol/L — ABNORMAL LOW (ref 101–111)
Glucose, Bld: 115 mg/dL — ABNORMAL HIGH (ref 65–99)
Potassium: 3.7 mmol/L (ref 3.5–5.1)
SODIUM: 134 mmol/L — AB (ref 135–145)
Total Bilirubin: 0.8 mg/dL (ref 0.3–1.2)
Total Protein: 5.8 g/dL — ABNORMAL LOW (ref 6.5–8.1)

## 2017-10-05 LAB — CULTURE, BLOOD (ROUTINE X 2)
CULTURE: NO GROWTH
Culture: NO GROWTH
Special Requests: ADEQUATE
Special Requests: ADEQUATE

## 2017-10-05 LAB — CBC
HEMATOCRIT: 39 % (ref 39.0–52.0)
HEMOGLOBIN: 13.3 g/dL (ref 13.0–17.0)
MCH: 33.6 pg (ref 26.0–34.0)
MCHC: 34.1 g/dL (ref 30.0–36.0)
MCV: 98.5 fL (ref 78.0–100.0)
Platelets: 305 10*3/uL (ref 150–400)
RBC: 3.96 MIL/uL — AB (ref 4.22–5.81)
RDW: 13 % (ref 11.5–15.5)
WBC: 17.2 10*3/uL — ABNORMAL HIGH (ref 4.0–10.5)

## 2017-10-05 LAB — LACTATE DEHYDROGENASE: LDH: 182 U/L (ref 98–192)

## 2017-10-05 MED ORDER — SODIUM CHLORIDE 0.9 % IV SOLN
3.0000 g | Freq: Once | INTRAVENOUS | Status: AC
  Administered 2017-10-05: 3 g via INTRAVENOUS
  Filled 2017-10-05: qty 3

## 2017-10-05 MED ORDER — SODIUM CHLORIDE 0.9 % IV SOLN
3.0000 g | Freq: Four times a day (QID) | INTRAVENOUS | Status: DC
  Administered 2017-10-06 – 2017-10-17 (×46): 3 g via INTRAVENOUS
  Filled 2017-10-05 (×48): qty 3

## 2017-10-05 MED ORDER — AZITHROMYCIN 250 MG PO TABS
500.0000 mg | ORAL_TABLET | Freq: Every day | ORAL | Status: DC
  Administered 2017-10-06: 500 mg via ORAL
  Filled 2017-10-05: qty 2

## 2017-10-05 MED ORDER — GUAIFENESIN-DM 100-10 MG/5ML PO SYRP: 5.0000 mL | ORAL_SOLUTION | ORAL | Status: DC | PRN

## 2017-10-05 NOTE — Progress Notes (Signed)
PHARMACIST - PHYSICIAN COMMUNICATION DR:   Sheran Fava CONCERNING: Antibiotic IV to Oral Route Change Policy  RECOMMENDATION: This patient is receiving azithromycin by the intravenous route.  Based on criteria approved by the Pharmacy and Therapeutics Committee, the antibiotic(s) is/are being converted to the equivalent oral dose form(s).   DESCRIPTION: These criteria include:  Patient being treated for a respiratory tract infection, urinary tract infection, cellulitis or clostridium difficile associated diarrhea if on metronidazole  The patient is not neutropenic and does not exhibit a GI malabsorption state  The patient is eating (either orally or via tube) and/or has been taking other orally administered medications for a least 24 hours  The patient is improving clinically and has a Tmax < 100.5  If you have questions about this conversion, please contact the Pharmacy Department  []   [phone] )  [facility] []   [phone] )  [facility] []   [phone] )  [facility] []   [phone] )  [facility] [x]   [phone] )  [place], PharmD, BCPS 10/05/2017 11:47 AM

## 2017-10-05 NOTE — Progress Notes (Signed)
PROGRESS NOTE  [name]  TFT:[id number] DOB: 01/22/1931 DOA: 09/30/2017 PCP: Noralee Space, MD  Brief Narrative:   83 y.o.malewith medical history significant ofchronic combined diastolic and systolic congestive heart failure, CAD, hypertension, hyperlipidemia, vascular disease who presented to the emergency department with complaints of increased cough and congestion started 2 weeks prior to hospital admission.  Chest x-ray demonstrated a right lower lobe pneumonia and right pleural effusion.  He was started on antibiotics for community-acquired pneumonia and IV diuretics however he has had increasing oxygen requirements and his chest x-ray shows that his effusion is worsening despite diuresis.  CT chest demonstrated a moderate right-sided pleural effusion without loculation.  There are areas of hypoattenuation in the right lung that could represent pneumonia, necrosis, or possible neoplasm.  There was also a possible mucoid impaction at the left apex.  The patient underwent thoracentesis on 12/14.  The fluid was exudative and loculated.  Pulmonology has been consulted.  His Plavix has been discontinued and he could potentially undergo a procedure on Monday 12/17.  I sat down with the patient and his daughter [name] today and we started a conversation about goals of care.  We discussed the possible treatment options for his pleural effusion, we have readdressed his aspiration and the possibility of feeding tube, and we started to talk briefly about full code versus DNR and quality of life.  Palliative care consult has been placed.  The daughter, her sister, and her dad are going to discuss their options and they would like to meet with palliative care before making any decisions.  Prior to coming into the hospital, the patient was able to drive himself to the grocery store and ambulate with a cane.  He was able to buy groceries and bring them home and unpacked them.  He would go out to dinner with  his daughter and was relatively independent.  Assessment & Plan:   Principal Problem:   Acute hypoxemic respiratory failure (HCC) Active Problems:   Essential hypertension   Carotid arterial disease (HCC)   CAP (community acquired pneumonia)   Cardiomyopathy, ischemic   Subdural hematoma (HCC)   Traumatic subdural hematoma with loss of consciousness (HCC)   Pulmonary hypertension (HCC)   TBI (traumatic brain injury) ([place])   Thrombocytopenia ([place])   Hypertensive heart disease with heart failure (HCC)   Chronic combined systolic and diastolic CHF (congestive heart failure) (HCC)   COPD with acute bronchitis (HCC)   CHF exacerbation (HCC)   Pressure injury of skin  1. Acute hypoxemic respiratory failure secondary to pleural effusion and aspiration pneumonia, oxygen has been decreased to 2 L 1. Suspect due to acute on chronic combined systolic and diastolic CHF, aspiration PNA 2. Continue aZithromax and change ceftriaxone to Unasyn since patient is not improving for better anaerobic coverage, day 6 3. Blood cultures no growth to date 4. MRSA PCR negative 5. Thoracentesis: Loculated exudative. 6. Pulmonology consultation 2. HTN, mildly hypotensive 1. Cont very low-dose carvedilol 3. Dysphagia 1. Aspiration precautions 2. Diet advanced by speech therapy today to dysphagia 3 3. Appreciate speech therapy assistance 4. Patient declines feeding tube 4. Acute on chronic combined sys and dias chf exacerbation 1. Most recent 2d echo with EF 30-35% 2. Presenting BNP of 1348, was 496 on 01/22/17 3. Continue oral Lasix 5. COPD 1. No wheezing at this time  6. Hx subdural hematoma 1. Stable at this time  7. Prolonged QTc, paced rhythm 1. QTc of 527. Would avoid QT prolonging  medications 8. Hypokalemia secondary to diuretics, resolved with oral potassium repletion 9. Generalized weakness, patient and his daughter do not want him to go to a skilled nursing facility.  They would prefer to  take him home if possible 10. Persistent leukocytosis may be secondary to pneumonia, inflammation, malignancy 11. Mild transaminitis, hold pravastatin.  If it continues to rise we will need to hold amiodarone as well.  DVT prophylaxis: Lovenox SQ Code Status: Full  Family Communication: Patient at bedside, no family at bedside Disposition Plan:  to SNF versus home with home health services, family is leaning towards home with home health services   Consultants:   Pulmonology/critical care  Palliative care  Procedures:   None  Antimicrobials:  Anti-infectives (From admission, onward)   Start     Dose/Rate Route Frequency Ordered Stop   10/06/17 1000  azithromycin (ZITHROMAX) tablet 500 mg     500 mg Oral Daily 10/05/17 1148     10/01/17 1100  azithromycin (ZITHROMAX) 500 mg in dextrose 5 % 250 mL IVPB  Status:  Discontinued     500 mg 250 mL/hr over 60 Minutes Intravenous Every 24 hours 09/30/17 1148 10/05/17 1148   10/01/17 1000  cefTRIAXone (ROCEPHIN) 1 g in dextrose 5 % 50 mL IVPB     1 g 100 mL/hr over 30 Minutes Intravenous Every 24 hours 09/30/17 1148     09/30/17 1015  cefTRIAXone (ROCEPHIN) 1 g in dextrose 5 % 50 mL IVPB     1 g 100 mL/hr over 30 Minutes Intravenous  Once 09/30/17 1006 09/30/17 1056   09/30/17 1015  azithromycin (ZITHROMAX) 500 mg in dextrose 5 % 250 mL IVPB     500 mg 250 mL/hr over 60 Minutes Intravenous  Once 09/30/17 1006 09/30/17 1212       Subjective:  He would really like to go home.  He still has a terrible cough productive of some thick phlegm.  He denies shortness of breath he is continuing to wear oxygen.  He denies nausea and has been eating some.  He denies problems with his bowels or bladder.  Objective: Vitals:   10/04/17 2112 10/05/17 0418 10/05/17 0847 10/05/17 1336  BP: (!) 150/66 (!) 95/54 94/62 (!) 120/56  Pulse: 65 60 64 61  Resp: 18 20  20   Temp: 98.3 F (36.8 C) 98.6 F (37 C)  97.6 F (36.4 C)  TempSrc: Oral Oral   Oral  SpO2: 98% 95%  98%  Weight:  75.8 kg (167 lb 1.7 oz)    Height:        Intake/Output Summary (Last 24 hours) at 10/05/2017 1646 Last data filed at 10/05/2017 1253 Gross per 24 hour  Intake 300 ml  Output 525 ml  Net -225 ml   Filed Weights   10/04/17 0500 10/04/17 1239 10/05/17 0418  Weight: 77.6 kg (171 lb 1.2 oz) 75.2 kg (165 lb 12.6 oz) 75.8 kg (167 lb 1.7 oz)    Examination:  General exam:  Adult male, nasal cannula in place, appears very weak lying in bed.  No acute distress.  HEENT:  NCAT, MMM Respiratory system: Rhonchorous breath sounds throughout, diminished breath sounds in the right lung fields with rales in the left right lung fields, no obvious wheeze Cardiovascular system: Regular rate and rhythm, normal S1/S2. No murmurs, rubs, gallops or clicks.  Warm extremities Gastrointestinal system: Normal active bowel sounds, soft, nondistended, nontender. MSK:  Normal tone and bulk, 1+ pitting bilateral lower extremity edema Neuro:  Grossly intact, diffusely weak  Data Reviewed: I have personally reviewed following labs and imaging studies  CBC: Recent Labs  Lab 10/01/17 0335 10/02/17 0310 10/03/17 0256 10/04/17 0318 10/05/17 0352  WBC 15.7* 19.7* 17.7* 19.6* 17.2*  HGB 13.4 13.2 14.3 13.6 13.3  HCT 37.9* 37.9* 40.2 39.1 39.0  MCV 96.7 98.2 97.3 97.0 98.5  PLT 168 190 203 267 270   Basic Metabolic Panel: Recent Labs  Lab 10/01/17 0335 10/02/17 0310 10/03/17 0256 10/04/17 0318 10/05/17 0352  NA 131* 132* 131* 132* 134*  K 3.2* 3.0* 3.7 2.9* 3.7  CL 93* 94* 94* 95* 95*  CO2 26 29 24 29 29   GLUCOSE 110* 132* 141* 149* 115*  BUN 32* 35* 40* 43* 40*  CREATININE 1.00 0.96 0.95 1.02 0.96  CALCIUM 8.1* 8.2* 8.1* 8.2* 8.3*  MG  --  1.8  --  2.0  --    GFR: Estimated Creatinine Clearance: 61 mL/min (by C-G formula based on SCr of 0.96 mg/dL). Liver Function Tests: Recent Labs  Lab 10/01/17 0335 10/05/17 0352  AST 39 101*  ALT 27 88*  ALKPHOS 69  72  BILITOT 1.1 0.8  PROT 6.5 5.8*  ALBUMIN 2.6* 2.1*   No results for input(s): LIPASE, AMYLASE in the last 168 hours. No results for input(s): AMMONIA in the last 168 hours. Coagulation Profile: No results for input(s): INR, PROTIME in the last 168 hours. Cardiac Enzymes: No results for input(s): CKTOTAL, CKMB, CKMBINDEX, TROPONINI in the last 168 hours. BNP (last 3 results) Recent Labs    01/22/17 1231  PROBNP 496.0*   HbA1C: No results for input(s): HGBA1C in the last 72 hours. CBG: No results for input(s): GLUCAP in the last 168 hours. Lipid Profile: No results for input(s): CHOL, HDL, LDLCALC, TRIG, CHOLHDL, LDLDIRECT in the last 72 hours. Thyroid Function Tests: No results for input(s): TSH, T4TOTAL, FREET4, T3FREE, THYROIDAB in the last 72 hours. Anemia Panel: No results for input(s): VITAMINB12, FOLATE, FERRITIN, TIBC, IRON, RETICCTPCT in the last 72 hours. Urine analysis:    Component Value Date/Time   COLORURINE YELLOW 04/14/2016 1028   APPEARANCEUR CLEAR 04/14/2016 1028   LABSPEC 1.013 04/14/2016 1028   PHURINE 6.0 04/14/2016 1028   GLUCOSEU NEGATIVE 04/14/2016 1028   HGBUR NEGATIVE 04/14/2016 1028   BILIRUBINUR NEGATIVE 04/14/2016 1028   KETONESUR NEGATIVE 04/14/2016 1028   PROTEINUR NEGATIVE 04/14/2016 1028   UROBILINOGEN 2.0 (H) 10/28/2012 0820   NITRITE NEGATIVE 04/14/2016 1028   LEUKOCYTESUR NEGATIVE 04/14/2016 1028   Sepsis Labs: @LABRCNTIP (procalcitonin:4,lacticidven:4)  ) Recent Results (from the past 240 hour(s))  Culture, blood (routine x 2) Call MD if unable to obtain prior to antibiotics being given     Status: None   Collection Time: 09/30/17  1:37 PM  Result Value Ref Range Status   Specimen Description BLOOD RIGHT FOREARM  Final   Special Requests   Final    BOTTLES DRAWN AEROBIC AND ANAEROBIC Blood Culture adequate volume   Culture   Final    NO GROWTH 5 DAYS Performed at [facility], [place] [name], [place], [place]  [address]    Report Status 10/05/2017 FINAL  Final  MRSA PCR Screening     Status: None   Collection Time: 09/30/17  7:17 PM  Result Value Ref Range Status   MRSA by PCR NEGATIVE NEGATIVE Final    Comment:        The GeneXpert MRSA Assay (FDA approved for NASAL specimens only), is one component  of a comprehensive MRSA colonization surveillance program. It is not intended to diagnose MRSA infection nor to guide or monitor treatment for MRSA infections.   Culture, blood (routine x 2) Call MD if unable to obtain prior to antibiotics being given     Status: None   Collection Time: 09/30/17  7:39 PM  Result Value Ref Range Status   Specimen Description BLOOD RIGHT ANTECUBITAL  Final   Special Requests IN PEDIATRIC BOTTLE Blood Culture adequate volume  Final   Culture   Final    NO GROWTH 5 DAYS Performed at [facility], [place] [address] [place], [place] [address]    Report Status 10/05/2017 FINAL  Final  Body fluid culture     Status: None (Preliminary result)   Collection Time: 10/04/17  4:40 PM  Result Value Ref Range Status   Specimen Description PLEURAL  Final   Special Requests NONE  Final   Gram Stain   Final    ABUNDANT WBC PRESENT,BOTH PMN AND MONONUCLEAR NO ORGANISMS SEEN    Culture   Final    NO GROWTH 1 DAY Performed at [facility], [place] [address], [place], [place] [address]    Report Status PENDING  Incomplete      Radiology Studies: Dg Chest Port 1 View  Result Date: 10/04/2017 CLINICAL DATA:  Post thoracentesis EXAM: PORTABLE CHEST 1 VIEW COMPARISON:  10/03/2017 FINDINGS: Large right pleural effusion remains. No pneumothorax following thoracentesis. Right lower lobe atelectasis. Cardiomegaly. Left AICD is unchanged. No confluent opacity on the left. IMPRESSION: Large right pleural effusion with right base atelectasis. No pneumothorax following thoracentesis. Electronically Signed   By: Rolm Baptise M.D.   On: 10/04/2017 15:26   US Thoracentesis Asp  Pleural Space W/img Guide  Result Date: 10/04/2017 INDICATION: Coronary artery disease, CHF, pneumonia, cough, COPD, right pleural effusion. Request made for diagnostic and therapeutic right thoracentesis. EXAM: ULTRASOUND GUIDED DIAGNOSTIC AND THERAPEUTIC RIGHT THORACENTESIS MEDICATIONS: None. COMPLICATIONS: None immediate. PROCEDURE: An ultrasound guided thoracentesis was thoroughly discussed with the patient and questions answered. The benefits, risks, alternatives and complications were also discussed. The patient understands and wishes to proceed with the procedure. Written consent was obtained. Ultrasound was performed to localize and mark an adequate pocket of fluid in the right chest. The area was then prepped and draped in the normal sterile fashion. 1% Lidocaine was used for local anesthesia. Under ultrasound guidance a Safe-T-Centesis catheter was introduced. Thoracentesis was performed. The catheter was removed and a dressing applied. FINDINGS: A total of approximately 1.4 liters of hazy, yellow fluid was removed. Samples were sent to the laboratory as requested by the clinical team. The pleural fluid collection was multiloculated. IMPRESSION: Successful ultrasound guided diagnostic and therapeutic right thoracentesis yielding 1.4 liters of pleural fluid. Read by: Rowe Robert, PA-C Electronically Signed   By: Sandi Mariscal M.D.   On: 10/04/2017 16:37     Scheduled Meds: . allopurinol  100 mg Oral Daily  . amiodarone  200 mg Oral Daily  . [START ON 10/06/2017] azithromycin  500 mg Oral Daily  . carvedilol  3.125 mg Oral BID WC  . enoxaparin (LOVENOX) injection  40 mg Subcutaneous Q24H  . furosemide  40 mg Oral Daily  . mouth rinse  15 mL Mouth Rinse BID  . oxybutynin  5 mg Oral BID  . pravastatin  40 mg Oral Daily  . sertraline  50 mg Oral QHS   Continuous Infusions: . cefTRIAXone (ROCEPHIN)  IV 1 g (  10/05/17 0849)     LOS: 5 days    Time spent: 30 min    Janece Canterbury,  MD [facility] Pager [phone]  If 7PM-7AM, please contact night-coverage [email or web address] Password [facility] 10/05/2017, 4:46 PM

## 2017-10-05 NOTE — Clinical Social Work Note (Signed)
Clinical Social Work Assessment  Patient Details  Name: [name] MRN: [id number] Date of Birth: 14-Nov-1930  Date of referral:  10/05/17               Reason for consult:  Facility Placement, Discharge Planning                Permission sought to share information with:  Case Manager, Customer service manager, Family Supports Permission granted to share information::  Yes, Verbal Permission Granted  Name::        Agency::     Relationship::  Catering manager Information:     Housing/Transportation Living arrangements for the past 2 months:  [place] of Information:  Patient, Medical Team, Adult Children Patient Interpreter Needed:  None Criminal Activity/Legal Involvement Pertinent to Current Situation/Hospitalization:  No - Comment as needed Significant Relationships:  Adult Children Lives with:  Self, Pets Do you feel safe going back to the place where you live?  No Need for family participation in patient care:  Yes (Comment)  Care giving concerns:  Per EMS- patient saw a pulmonologist last week for a cough and was prescribed Mucinex. Today, the patient c/o SOB  Room air sats-90%, coarse LLL.. Family reported that the patient has gotten progressively worse.  Patient lives along with his little dog per report. He has become progressively weaker and feels he cannot manage independently at discharge. Agreeable to short term rehab at discharge.   Social Worker assessment / plan:  Consult placed for new SNF placement. Patient reports he has completed CIR in the past, but not SNF. He reports he is agreeable for [facility] referrals for SNF. He gives permission to update daughter [name]. No family at bed side.  Plan: Needs bed offers and DC to SNF when medically stable. Completed SNF work up.  Employment status:  Retired Forensic scientist:  [employer] PT Recommendations:  [place] / Referral to community resources:     SNF List: [facility]  Patient/Family's Response to care:  Agreeable to plan  Patient/Family's Understanding of and Emotional Response to Diagnosis, Current Treatment, and Prognosis:  "I really want some ice and I have been calling for a while, can you help me with this".  Patient voices frustration with time to wait for needs. He voices appreciation of help and assistance with preparing his discharge and aware of his limitations with returning home safely.  Emotional Assessment Appearance:  Appears stated age Attitude/Demeanor/Rapport:    Affect (typically observed):  Accepting, Adaptable Orientation:  Oriented to Self, Oriented to Place, Oriented to Situation, Oriented to  Time Alcohol / Substance use:  Not Applicable Psych involvement (Current and /or in the community):  No (Comment)  Discharge Needs  Concerns to be addressed:  Care Coordination, Other (Comment Required("I have been asking for ice for a while, may I have some ice") Readmission within the last 30 days:  No Current discharge risk:  None Barriers to Discharge:  No Barriers Identified   Lilly [place], LCSW 10/05/2017, 12:54 PM

## 2017-10-05 NOTE — NC FL2 (Signed)
[place] LEVEL OF CARE SCREENING TOOL     IDENTIFICATION  Patient Name: [name] Birthdate: 02-Jul-1931 Sex: male Admission Date (Current Location): 09/30/2017  [facility] and [place] Number:  Herbalist and Address:  [facility],  [place] [address] [place]      Provider Number: [id number]  Attending Physician Name and Address:  Janece Canterbury, MD  Relative Name and Phone Number:       Current Level of Care: Hospital Recommended Level of Care: [place] Prior Approval Number:    Date Approved/Denied:   PASRR Number:   [id number]  Discharge Plan: SNF    Current Diagnoses: Patient Active Problem List   Diagnosis Date Noted  . Pressure injury of skin 10/01/2017  . Acute hypoxemic respiratory failure ([place]) 09/30/2017  . CHF exacerbation ([place]) 09/30/2017  . Shingles outbreak 05/27/2017  . Seborrheic dermatitis 05/27/2017  . Hypotension due to drugs 04/01/2017  . Acute on chronic respiratory failure with hypoxia ([place]) 01/13/2017  . COPD with acute bronchitis ([place]) 01/13/2017  . CAD S/P percutaneous coronary angioplasty   . Chronic combined systolic and diastolic CHF (congestive heart failure) ([place])   . Blunt chest trauma 05/04/2016  . 4.1 cm Aneurysm of thoracic aorta ([place]) 04/27/2016  . SIADH (syndrome of inappropriate ADH production) ([place])   . Hypertensive heart disease with heart failure ([place])   . Orthostatic hypotension 04/12/2016  . TBI (traumatic brain injury) ([place])   . Acute on chronic combined systolic and diastolic congestive heart failure ([place])   . S/P ORIF (open reduction internal fixation) fracture   . Thrombocytopenia ([place])   . Urinary retention   . Slow transit constipation   . Thyroid activity decreased   . IVCD (intraventricular conduction defect) 04/05/2016  . Traumatic subdural hematoma with loss of consciousness ([place])   . Pulmonary hypertension ([place])   . Cardiomyopathy, ischemic    . Subdural hematoma ([place])   . Ventricular tachycardia ([place]) 04/01/2016  . Cardiac arrest ([place]) 03/29/2016  . 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 CAP (community acquired pneumonia) 04/28/2015  . T wave over sensing resulting in inappropriate shocks 08/14/2013  . Vitamin D deficiency disease 04/09/2012  . Actinic skin damage 05/22/2011  . Alcohol use 03/22/2011  . Other dysphagia 02/16/2011  . Carotid arterial disease ([place]) 11/17/2009  . GOUT 09/23/2009  . SYNCOPE 04/19/2009  . Closed fracture of bone 07/27/2008  . Automatic implantable cardioverter-defibrillator in situ 04/13/2008  . COLONIC POLYPS 12/09/2007  . DIVERTICULOSIS OF COLON 12/09/2007  . Essential hypertension 12/08/2007  . Coronary atherosclerosis 12/08/2007  . Peripheral vascular disease ([place]) 12/08/2007  . COPD GOLD III 12/08/2007  . Osteoarthritis 12/08/2007    Orientation RESPIRATION BLADDER Height & Weight     Self, Time, Situation, Place  O2(2L) Continent Weight: 167 lb 1.7 oz (75.8 kg) Height:  5\' 11"  (180.3 cm)  BEHAVIORAL SYMPTOMS/MOOD NEUROLOGICAL BOWEL NUTRITION STATUS   calm and cooperative  alert and oriented Continent Diet(DYS 3)  Diet recommendations: Dysphagia 3 (mechanical soft);Nectar-thick liquid;Other(comment)(tsps of thin water with head neutral) Liquids provided via: Straw;Teaspoon;Cup Medication Administration: (crush with Ensure) Supervision: Patient able to self feed;Full supervision/cueing for compensatory strategies Compensations: Slow rate;Small sips/bites;Multiple dry swallows after each bite/sip;Chin tuck;Other (Comment);Effortful swallow(cough/expectorate) Postural Changes and/or Swallow Maneuvers: Seated upright 90 degrees;Upright 30-60 min after meal  AMBULATORY STATUS COMMUNICATION OF NEEDS Skin   Extensive Assist Verbally PU Stage and Appropriate Care PU Stage 1 Dressing: BID(Deep Tissue  Injury - Purple or maroon localized area of discolored intact skin or  blood-filled blister due to damage of underlying soft tissue from pressure and/or shear. (Buttocks)) PU Stage 2 Dressing: TID(Stage II -  Partial thickness loss of dermis presenting as a shallow open ulcer with a red, pink wound bed without slough. (back))                   Personal Care Assistance Level of Assistance  Bathing, Feeding, Dressing Bathing Assistance: Limited assistance Feeding assistance: Limited assistance Dressing Assistance: Limited assistance     Functional Limitations Info  Sight, Hearing, Speech Sight Info: Adequate Hearing Info: Adequate Speech Info: Adequate    SPECIAL CARE FACTORS FREQUENCY  PT (By licensed PT), OT (By licensed OT), Speech therapy     PT Frequency: 5x OT Frequency: 5x     Speech Therapy Frequency: 3x      Contractures Contractures Info: Not present    Additional Factors Info  Code Status, Allergies Code Status Info: Full Code Allergies Info: Lisinopril           Current Medications (10/05/2017):  This is the current hospital active medication list Current Facility-Administered Medications  Medication Dose Route Frequency Provider Last Rate Last Dose  . acetaminophen (TYLENOL) tablet 650 mg  650 mg Oral Q6H PRN Donne Hazel, MD       Or  . acetaminophen (TYLENOL) suppository 650 mg  650 mg Rectal Q6H PRN Donne Hazel, MD      . albuterol (PROVENTIL) (2.5 MG/3ML) 0.083% nebulizer solution 2.5 mg  2.5 mg Nebulization Q6H PRN Donne Hazel, MD      . allopurinol (ZYLOPRIM) tablet 100 mg  100 mg Oral Daily Donne Hazel, MD   100 mg at 10/05/17 0848  . amiodarone (PACERONE) tablet 200 mg  200 mg Oral Daily Donne Hazel, MD   200 mg at 10/05/17 0848  . [START ON 10/06/2017] azithromycin (ZITHROMAX) tablet 500 mg  500 mg Oral Daily Short, Mackenzie, MD      . carvedilol (COREG) tablet 3.125 mg  3.125 mg Oral BID WC Donne Hazel, MD   3.125 mg at 10/04/17 6270  . cefTRIAXone (ROCEPHIN) 1 g in dextrose 5 % 50 mL IVPB   1 g Intravenous Q24H Donne Hazel, MD 100 mL/hr at 10/05/17 0849 1 g at 10/05/17 0849  . enoxaparin (LOVENOX) injection 40 mg  40 mg Subcutaneous Q24H Donne Hazel, MD   40 mg at 10/04/17 2110  . furosemide (LASIX) tablet 40 mg  40 mg Oral Daily Janece Canterbury, MD   40 mg at 10/05/17 0848  . MEDLINE mouth rinse  15 mL Mouth Rinse BID Noralee Space, MD   15 mL at 10/04/17 2110  . oxybutynin (DITROPAN) tablet 5 mg  5 mg Oral BID Donne Hazel, MD   5 mg at 10/05/17 0847  . pravastatin (PRAVACHOL) tablet 40 mg  40 mg Oral Daily Donne Hazel, MD   40 mg at 10/05/17 0848  . RESOURCE THICKENUP CLEAR   Oral PRN Noralee Space, MD      . sertraline (ZOLOFT) tablet 50 mg  50 mg Oral QHS Donne Hazel, MD   50 mg at 10/04/17 2110  . traMADol (ULTRAM) tablet 50 mg  50 mg Oral Q6H PRN Donne Hazel, MD         Discharge Medications: Please see discharge summary for a list of discharge medications.  Relevant Imaging Results:  Relevant Lab Results:   Additional Information SS#: [id number]  Lilly C[place], LCSW

## 2017-10-05 NOTE — Clinical Social Work Placement (Signed)
   CLINICAL SOCIAL WORK PLACEMENT  NOTE  Date:  10/05/2017  Patient Details  Name: [name] MRN: [id number] Date of Birth: 08-13-1931  Clinical Social Work is seeking post-discharge placement for this patient at the   level of care (*CSW will initial, date and re-position this form in  chart as items are completed):  Yes   Patient/family provided with [place] Work Department's list of facilities offering this level of care within the geographic area requested by the patient (or if unable, by the patient's family).  Yes   Patient/family informed of their freedom to choose among providers that offer the needed level of care, that participate in Medicare, Medicaid or managed care program needed by the patient, have an available bed and are willing to accept the patient.  Yes   Patient/family informed of [facility]'s ownership interest in [facility] and [facility], as well as of the fact that they are under no obligation to receive care at these facilities.  PASRR submitted to EDS on       PASRR number received on       Existing PASRR number confirmed on 10/05/17     FL2 transmitted to all facilities in geographic area requested by pt/family on 10/05/17     FL2 transmitted to all facilities within larger geographic area on       Patient informed that his/her managed care company has contracts with or will negotiate with certain facilities, including the following:            Patient/family informed of bed offers received.  Patient chooses bed at       Physician recommends and patient chooses bed at      Patient to be transferred to   on  .  Patient to be transferred to facility by       Patient family notified on   of transfer.  Name of family member notified:        PHYSICIAN Please sign FL2     Additional Comment:    _______________________________________________ Lilly Cove, LCSW 10/05/2017, 1:06 PM
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
## 2017-10-05 NOTE — Progress Notes (Signed)
Pharmacy Antibiotic Note  [name] is a 83 y.o. male with hx HF and COPD presented to the ED on 09/30/2017 with c/o SOB.  Chest CTA on 12/13 showed moderated right-sided pleural effusion, without loculation-- s/p thoracentesis on 12/14.  Ceftriaxone and azithromycin started on admission for PNA.  To change abx to unasyn on 10/05/17 for suspected asp PNA.  - scr stable at 0.96 (crcl~61) - all cultures have been negative  Plan: - unasyn 3gm IV q6h - with stable renal function, pharmacy will sign off. Re-consult [place] if need further assistance. _______________________________  Height: 5\' 11"  (180.3 cm) Weight: 167 lb 1.7 oz (75.8 kg) IBW/kg (Calculated) : 75.3  Temp (24hrs), Avg:98.2 F (36.8 C), Min:97.6 F (36.4 C), Max:98.6 F (37 C)  Recent Labs  Lab 10/01/17 0335 10/02/17 0310 10/03/17 0256 10/04/17 0318 10/05/17 0352  WBC 15.7* 19.7* 17.7* 19.6* 17.2*  CREATININE 1.00 0.96 0.95 1.02 0.96    Estimated Creatinine Clearance: 61 mL/min (by C-G formula based on SCr of 0.96 mg/dL).    Allergies  Allergen Reactions  . Lisinopril     BP dropped too low per daughter     Thank you for allowing pharmacy to be a part of this patient's care.  Lynelle Doctor 10/05/2017 6:30 PM

## 2017-10-06 ENCOUNTER — Inpatient Hospital Stay (HOSPITAL_COMMUNITY): Payer: Medicare Other

## 2017-10-06 DIAGNOSIS — J90 Pleural effusion, not elsewhere classified: Secondary | ICD-10-CM

## 2017-10-06 LAB — COMPREHENSIVE METABOLIC PANEL
ALBUMIN: 1.9 g/dL — AB (ref 3.5–5.0)
ALK PHOS: 65 U/L (ref 38–126)
ALT: 101 U/L — AB (ref 17–63)
AST: 117 U/L — AB (ref 15–41)
Anion gap: 8 (ref 5–15)
BUN: 31 mg/dL — AB (ref 6–20)
CALCIUM: 8.1 mg/dL — AB (ref 8.9–10.3)
CHLORIDE: 96 mmol/L — AB (ref 101–111)
CO2: 31 mmol/L (ref 22–32)
CREATININE: 0.96 mg/dL (ref 0.61–1.24)
GFR calc non Af Amer: >60 mL/min (ref >60)
GLUCOSE: 109 mg/dL — AB (ref 65–99)
Potassium: 3.5 mmol/L (ref 3.5–5.1)
SODIUM: 135 mmol/L (ref 135–145)
Total Bilirubin: 0.8 mg/dL (ref 0.3–1.2)
Total Protein: 5.9 g/dL — ABNORMAL LOW (ref 6.5–8.1)

## 2017-10-06 LAB — CBC
HCT: 35.2 % — ABNORMAL LOW (ref 39.0–52.0)
HEMOGLOBIN: 12.1 g/dL — AB (ref 13.0–17.0)
MCH: 33.7 pg (ref 26.0–34.0)
MCHC: 34.4 g/dL (ref 30.0–36.0)
MCV: 98.1 fL (ref 78.0–100.0)
PLATELETS: 292 10*3/uL (ref 150–400)
RBC: 3.59 MIL/uL — AB (ref 4.22–5.81)
RDW: 13.1 % (ref 11.5–15.5)
WBC: 14 10*3/uL — AB (ref 4.0–10.5)

## 2017-10-06 LAB — CK: CK TOTAL: 16 U/L — AB (ref 49–397)

## 2017-10-06 LAB — PH, BODY FLUID: PH, BODY FLUID: 7.3

## 2017-10-06 MED ORDER — VITAMINS A & D EX OINT
TOPICAL_OINTMENT | CUTANEOUS | Status: AC
  Filled 2017-10-06: qty 5

## 2017-10-06 NOTE — Progress Notes (Addendum)
Name: [name] MRN: [id number] DOB: [birth year]    ADMISSION DATE:  09/30/2017 CONSULTATION DATE:  10/05/2017  REFERRING MD :  Dr. Sheran Fava  CHIEF COMPLAINT:  Pleural effusion  HISTORY OF PRESENT ILLNESS:   83 yo male presented with 2 weeks of dyspnea.  Treated for pneumonia.  Found to have Rt effusion.  PMHx of COPD, combined CHF, CAD, HTN, HLD.  SIGNIFICANT EVENTS  12/10 admit 12/13 CT chest with effusion  12/14 Thoracentesis 12/15 PCCM consult  STUDIES:  Echo 12/10 >> mild LVH, EF 30 to 35%, grade 2 DD, mild AS, PAS 43 mmHg CT chest 12/13 >> Moderate R-sided pleural effusion without loculation. Bronchus intermedius compression or obstruction. Resultant right middle lobe and right lower lobe volume loss. Probable mucoid impaction at the left apex. Emphysema. Rt thoracentesis 12/14 >> glucose 85, protein 3.7, LDH 984, WBC 1175 (82%N)  CULTURES: Blood 12/10 >> negative Pleural fluid 12/10 >>   ANTIBIOTICS: Rocephin 12/10 >> 12/16 Zithromax 12/10 >> 12/15 Unasyn 12/15 >>   SUBJECTIVE:  Still has wet cough.  VITAL SIGNS: BP (!) 108/97 (BP Location: Right Arm)   Pulse 61   Temp 98.7 F (37.1 C) (Oral)   Resp 16   Ht 5\' 11"  (1.803 m)   Wt 171 lb 15.3 oz (78 kg)   SpO2 98%   BMI 23.98 kg/m   INTAKE/OUTPUT: I/O last 3 completed shifts: In: 500 [IV Piggyback:500] Out: 625 [Urine:625]  PHYSICAL EXAMINATION:  General - pleasant Eyes - pupils reactive ENT - no sinus tenderness, no oral exudate, no LAN Cardiac - regular, no murmur Chest - basilar crackles Rt > Lt Abd - soft, non tender Ext - no edema Skin - no rashes Neuro - normal strength   LABS: CMP Latest Ref Rng & Units 10/06/2017 10/05/2017 10/04/2017  Glucose 65 - 99 mg/dL 109(H) 115(H) 149(H)  BUN 6 - 20 mg/dL 31(H) 40(H) 43(H)  Creatinine 0.61 - 1.24 mg/dL 0.96 0.96 1.02  Sodium 135 - 145 mmol/L 135 134(L) 132(L)  Potassium 3.5 - 5.1 mmol/L 3.5 3.7 2.9(L)  Chloride 101 - 111 mmol/L 96(L) 95(L)  95(L)  CO2 22 - 32 mmol/L 31 29 29   Calcium 8.9 - 10.3 mg/dL 8.1(L) 8.3(L) 8.2(L)  Total Protein 6.5 - 8.1 g/dL 5.9(L) 5.8(L) -  Total Bilirubin 0.3 - 1.2 mg/dL 0.8 0.8 -  Alkaline Phos 38 - 126 U/L 65 72 -  AST 15 - 41 U/L 117(H) 101(H) -  ALT 17 - 63 U/L 101(H) 88(H) -   CBC Latest Ref Rng & Units 10/06/2017 10/05/2017 10/04/2017  WBC 4.0 - 10.5 K/uL 14.0(H) 17.2(H) 19.6(H)  Hemoglobin 13.0 - 17.0 g/dL 12.1(L) 13.3 13.6  Hematocrit 39.0 - 52.0 % 35.2(L) 39.0 39.1  Platelets 150 - 400 K/uL 292 305 267   IMAGING: Dg Chest Port 1 View  Result Date: 10/06/2017 CLINICAL DATA:  83 year old male status post ultrasound-guided right thoracentesis 10/04/2017. EXAM: PORTABLE CHEST 1 VIEW COMPARISON:  10/04/2017 and earlier. FINDINGS: Portable AP semi upright view at 0432 hours. Moderate volume residual right pleural effusion appears stable since 10/04/2017. No pneumothorax or pulmonary edema. Stable cardiac size and mediastinal contours. Stable left chest cardiac AICD. Calcified aortic atherosclerosis. Minimal patchy opacity at the left lung base is increased. Negative visible bowel gas pattern in the upper abdomen. Stable visualized osseous structures. IMPRESSION: 1. Stable moderate residual right pleural effusion since 10/04/2017. 2. Increased mild left lung base atelectasis. Electronically Signed   By: Herminio Heads.D.  On: 10/06/2017 06:56    DISCUSSION: 83 yo male with probable aspiration pneumonia causing parapneumonia right pleural effusion.  He had antibiotics changed to have better coverage for aspiration on 12/15.  Palliative care has been consulted.  He has history of COPD with emphysema and combined CHF.  ASSESSMENT / PLAN:  Exudate, neutrophil predominant Rt pleural effusion likely from recurrent aspiration pneumonia. - agree with further discussions with palliative care team - continue unasyn - f/u CXR - defer chest tube placement for now, and see if he improves after change of Abx  12/15 - f/u pleural fluid cytology from 12/14  Acute hypoxic respiratory failure. - oxygen to keep SpO2 90 to 95%  COPD with emphysema. - continue BDs  DVT prophylaxis - lovenox SUP - not indicated Nutrition - D3 diet Goals of care - full code  Updated pt's daughter at bedside  Chesley Mires, MD [place] 10/06/2017, 5:50 PM Pager:  [phone] After 3pm call: [phone]

## 2017-10-06 NOTE — Progress Notes (Signed)
CSW met with patient at bedside. Patient alert and oriented. CSW gave patient list of SNF bed offers. Patient deferred to his daughter, [name] for decision on SNF. CSW called [name] and left voicemail message requesting call back. CSW to follow for bed choice and discharge planning.  Estanislado Emms, [place]
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
## 2017-10-06 NOTE — Progress Notes (Signed)
PROGRESS NOTE  [name]  PJK:[id number] DOB: [birth year] DOA: 09/30/2017 PCP: Noralee Space, MD  Brief Narrative:   83 y.o.malewith medical history significant ofchronic combined diastolic and systolic congestive heart failure, CAD, hypertension, hyperlipidemia, vascular disease who presented to the emergency department with complaints of increased cough and congestion started 2 weeks prior to hospital admission.  Chest x-ray demonstrated a right lower lobe pneumonia and right pleural effusion.  He was started on antibiotics for community-acquired pneumonia and IV diuretics however he has had increasing oxygen requirements and his chest x-ray shows that his effusion is worsening despite diuresis.  CT chest demonstrated a moderate right-sided pleural effusion without loculation.  There are areas of hypoattenuation in the right lung that could represent pneumonia, necrosis, or possible neoplasm.  There was also a possible mucoid impaction at the left apex.  The patient underwent thoracentesis on 12/14.  The fluid was exudative and loculated.  Pulmonology and palliative care have been consulted.  Patient and his family will sit down to discuss [place] on 12/17 at which time they can decide about VATS, chest tubes, or no procedures.  Code status will be readdress.  Plavix has been held, last dose on Friday, so patient could be scheduled for procedure on Tuesday, if he chooses.    Assessment & Plan:   Principal Problem:   Acute hypoxemic respiratory failure (HCC) Active Problems:   Essential hypertension   Carotid arterial disease (HCC)   CAP (community acquired pneumonia)   Cardiomyopathy, ischemic   Subdural hematoma (HCC)   Traumatic subdural hematoma with loss of consciousness (HCC)   Pulmonary hypertension (HCC)   TBI (traumatic brain injury) ([place])   Thrombocytopenia ([place])   Hypertensive heart disease with heart failure (HCC)   Chronic combined systolic and diastolic CHF (congestive  heart failure) (HCC)   COPD with acute bronchitis (HCC)   CHF exacerbation (HCC)   Pressure injury of skin  1. Acute hypoxemic respiratory failure secondary to pleural effusion and aspiration pneumonia, oxygen has been decreased to 2 L 1. Suspect due to acute on chronic combined systolic and diastolic CHF, aspiration PNA 2. Completed azithromycin today. 3. Continue Unasyn day 2 of anaerobic coverage 4. Blood cultures no growth to date 5. MRSA PCR negative 6. Thoracentesis: Loculated exudative. 7. Pulmonology consultation appreciated 8. Patient and family to decide on possible procedure on 12/17 2. HTN, mildly hypotensive 1. Cont very low-dose carvedilol 3. Dysphagia 1. Aspiration precautions 2. Diet advanced by speech therapy today to dysphagia 3 3. Appreciate speech therapy assistance 4. Patient declines feeding tube 4. Acute on chronic combined sys and dias chf exacerbation 1. Most recent 2d echo with EF 30-35% 2. Presenting BNP of 1348, was 496 on 01/22/17 3. Continue oral Lasix 5. COPD 1. No wheezing at this time  6. Hx subdural hematoma 1. Stable at this time  7. Prolonged QTc, paced rhythm 1. QTc of 527. Would avoid QT prolonging medications 8. Hypokalemia secondary to diuretics, resolved with oral potassium repletion 9. Generalized weakness, patient and his daughter do not want him to go to a skilled nursing facility.  They would prefer to take him home if possible 10. Persistent leukocytosis may be secondary to pneumonia, inflammation, malignancy 11. Mild transaminitis, worsening.  1. Continue to hold pravastatin 2. Discontinue allopurinol and amiodarone as well 3. Acute hepatitis panel and CK 16 4. Trend LFTs  DVT prophylaxis: Lovenox SQ Code Status: Full  Family Communication: Patient at bedside, no family at bedside Disposition Plan:  to SNF versus home with home health services, family is leaning towards home with home health services.  Awaiting decision about  possible chest tube/VATS   Consultants:   Pulmonology/critical care  Palliative care  Procedures:   None  Antimicrobials:  Anti-infectives (From admission, onward)   Start     Dose/Rate Route Frequency Ordered Stop   10/06/17 1000  azithromycin (ZITHROMAX) tablet 500 mg     500 mg Oral Daily 10/05/17 1148     10/06/17 0130  Ampicillin-Sulbactam (UNASYN) 3 g in sodium chloride 0.9 % 100 mL IVPB     3 g 200 mL/hr over 30 Minutes Intravenous Every 6 hours 10/05/17 1838     10/05/17 1845  Ampicillin-Sulbactam (UNASYN) 3 g in sodium chloride 0.9 % 100 mL IVPB     3 g 200 mL/hr over 30 Minutes Intravenous  Once 10/05/17 1838 10/05/17 2031   10/01/17 1100  azithromycin (ZITHROMAX) 500 mg in dextrose 5 % 250 mL IVPB  Status:  Discontinued     500 mg 250 mL/hr over 60 Minutes Intravenous Every 24 hours 09/30/17 1148 10/05/17 1148   10/01/17 1000  cefTRIAXone (ROCEPHIN) 1 g in dextrose 5 % 50 mL IVPB  Status:  Discontinued     1 g 100 mL/hr over 30 Minutes Intravenous Every 24 hours 09/30/17 1148 10/05/17 1826   09/30/17 1015  cefTRIAXone (ROCEPHIN) 1 g in dextrose 5 % 50 mL IVPB     1 g 100 mL/hr over 30 Minutes Intravenous  Once 09/30/17 1006 09/30/17 1056   09/30/17 1015  azithromycin (ZITHROMAX) 500 mg in dextrose 5 % 250 mL IVPB     500 mg 250 mL/hr over 60 Minutes Intravenous  Once 09/30/17 1006 09/30/17 1212       Subjective:  The patient states he would like to go home.  He states that he continues to have cough productive of very thick sputum and episodes of shortness of breath.  He states he has been eating some denies problems with his bowels or bladder.  Objective: Vitals:   10/05/17 2152 10/06/17 0529 10/06/17 0830 10/06/17 1302  BP: (!) 106/47 (!) 101/57 117/63 (!) 108/97  Pulse: 63 61 65 61  Resp: 19 19  16   Temp: 98.9 F (37.2 C) 97.8 F (36.6 C)  98.7 F (37.1 C)  TempSrc: Oral Oral  Oral  SpO2: 98% 99%  98%  Weight:  78 kg (171 lb 15.3 oz)    Height:         Intake/Output Summary (Last 24 hours) at 10/06/2017 1458 Last data filed at 10/06/2017 0829 Gross per 24 hour  Intake 200 ml  Output 500 ml  Net -300 ml   Filed Weights   10/04/17 1239 10/05/17 0418 10/06/17 0529  Weight: 75.2 kg (165 lb 12.6 oz) 75.8 kg (167 lb 1.7 oz) 78 kg (171 lb 15.3 oz)    Examination:  General exam:  Adult male.  No acute distress.  HEENT:  NCAT, MMM Respiratory system: Minutes breath sounds throughout the right lung fields, rales heard at the right mid-back and right apex, no wheeze, positive rhonchi Cardiovascular system: Regular rate and rhythm, normal S1/S2. No murmurs, rubs, gallops or clicks.  Warm extremities Gastrointestinal system: Normal active bowel sounds, soft, nondistended, nontender. MSK:  Normal tone and bulk, trace pitting bilateral lower extremity edema Neuro: Diffusely weak  Data Reviewed: I have personally reviewed following labs and imaging studies  CBC: Recent Labs  Lab 10/02/17 0310 10/03/17 0256 10/04/17  0086 10/05/17 0352 10/06/17 0605  WBC 19.7* 17.7* 19.6* 17.2* 14.0*  HGB 13.2 14.3 13.6 13.3 12.1*  HCT 37.9* 40.2 39.1 39.0 35.2*  MCV 98.2 97.3 97.0 98.5 98.1  PLT 190 203 267 305 761   Basic Metabolic Panel: Recent Labs  Lab 10/02/17 0310 10/03/17 0256 10/04/17 0318 10/05/17 0352 10/06/17 0605  NA 132* 131* 132* 134* 135  K 3.0* 3.7 2.9* 3.7 3.5  CL 94* 94* 95* 95* 96*  CO2 29 24 29 29 31   GLUCOSE 132* 141* 149* 115* 109*  BUN 35* 40* 43* 40* 31*  CREATININE 0.96 0.95 1.02 0.96 0.96  CALCIUM 8.2* 8.1* 8.2* 8.3* 8.1*  MG 1.8  --  2.0  --   --    GFR: Estimated Creatinine Clearance: 61 mL/min (by C-G formula based on SCr of 0.96 mg/dL). Liver Function Tests: Recent Labs  Lab 10/01/17 0335 10/05/17 0352 10/06/17 0605  AST 39 101* 117*  ALT 27 88* 101*  ALKPHOS 69 72 65  BILITOT 1.1 0.8 0.8  PROT 6.5 5.8* 5.9*  ALBUMIN 2.6* 2.1* 1.9*   No results for input(s): LIPASE, AMYLASE in the last 168  hours. No results for input(s): AMMONIA in the last 168 hours. Coagulation Profile: No results for input(s): INR, PROTIME in the last 168 hours. Cardiac Enzymes: Recent Labs  Lab 10/06/17 0841  CKTOTAL 16*   BNP (last 3 results) Recent Labs    01/22/17 1231  PROBNP 496.0*   HbA1C: No results for input(s): HGBA1C in the last 72 hours. CBG: No results for input(s): GLUCAP in the last 168 hours. Lipid Profile: No results for input(s): CHOL, HDL, LDLCALC, TRIG, CHOLHDL, LDLDIRECT in the last 72 hours. Thyroid Function Tests: No results for input(s): TSH, T4TOTAL, FREET4, T3FREE, THYROIDAB in the last 72 hours. Anemia Panel: No results for input(s): VITAMINB12, FOLATE, FERRITIN, TIBC, IRON, RETICCTPCT in the last 72 hours. Urine analysis:    Component Value Date/Time   COLORURINE YELLOW 04/14/2016 1028   APPEARANCEUR CLEAR 04/14/2016 1028   LABSPEC 1.013 04/14/2016 1028   PHURINE 6.0 04/14/2016 1028   GLUCOSEU NEGATIVE 04/14/2016 1028   HGBUR NEGATIVE 04/14/2016 1028   BILIRUBINUR NEGATIVE 04/14/2016 1028   KETONESUR NEGATIVE 04/14/2016 1028   PROTEINUR NEGATIVE 04/14/2016 1028   UROBILINOGEN 2.0 (H) 10/28/2012 0820   NITRITE NEGATIVE 04/14/2016 1028   LEUKOCYTESUR NEGATIVE 04/14/2016 1028   Sepsis Labs: @LABRCNTIP (procalcitonin:4,lacticidven:4)  ) Recent Results (from the past 240 hour(s))  Culture, blood (routine x 2) Call MD if unable to obtain prior to antibiotics being given     Status: None   Collection Time: 09/30/17  1:37 PM  Result Value Ref Range Status   Specimen Description BLOOD RIGHT FOREARM  Final   Special Requests   Final    BOTTLES DRAWN AEROBIC AND ANAEROBIC Blood Culture adequate volume   Culture   Final    NO GROWTH 5 DAYS Performed at [facility], [place] [address] [place], [place] [address]    Report Status 10/05/2017 FINAL  Final  MRSA PCR Screening     Status: None   Collection Time: 09/30/17  7:17 PM  Result Value Ref Range Status     MRSA by PCR NEGATIVE NEGATIVE Final    Comment:        The GeneXpert MRSA Assay (FDA approved for NASAL specimens only), is one component of a comprehensive MRSA colonization surveillance program. It is not intended to diagnose MRSA infection nor to guide or monitor  treatment for MRSA infections.   Culture, blood (routine x 2) Call MD if unable to obtain prior to antibiotics being given     Status: None   Collection Time: 09/30/17  7:39 PM  Result Value Ref Range Status   Specimen Description BLOOD RIGHT ANTECUBITAL  Final   Special Requests IN PEDIATRIC BOTTLE Blood Culture adequate volume  Final   Culture   Final    NO GROWTH 5 DAYS Performed at [facility], [place] [address], [place], [place] [address]    Report Status 10/05/2017 FINAL  Final  Body fluid culture     Status: None (Preliminary result)   Collection Time: 10/04/17  4:40 PM  Result Value Ref Range Status   Specimen Description PLEURAL  Final   Special Requests NONE  Final   Gram Stain   Final    ABUNDANT WBC PRESENT,BOTH PMN AND MONONUCLEAR NO ORGANISMS SEEN    Culture   Final    NO GROWTH 2 DAYS Performed at [facility], [place] [address] [place], [place] [address]    Report Status PENDING  Incomplete      Radiology Studies: Dg Chest Port 1 View  Result Date: 10/06/2017 CLINICAL DATA:  83 year old male status post ultrasound-guided right thoracentesis 10/04/2017. EXAM: PORTABLE CHEST 1 VIEW COMPARISON:  10/04/2017 and earlier. FINDINGS: Portable AP semi upright view at 0432 hours. Moderate volume residual right pleural effusion appears stable since 10/04/2017. No pneumothorax or pulmonary edema. Stable cardiac size and mediastinal contours. Stable left chest cardiac AICD. Calcified aortic atherosclerosis. Minimal patchy opacity at the left lung base is increased. Negative visible bowel gas pattern in the upper abdomen. Stable visualized osseous structures. IMPRESSION: 1. Stable moderate residual  right pleural effusion since 10/04/2017. 2. Increased mild left lung base atelectasis. Electronically Signed   By: Genevie Ann M.D.   On: 10/06/2017 06:56   Dg Chest Port 1 View  Result Date: 10/04/2017 CLINICAL DATA:  Post thoracentesis EXAM: PORTABLE CHEST 1 VIEW COMPARISON:  10/03/2017 FINDINGS: Large right pleural effusion remains. No pneumothorax following thoracentesis. Right lower lobe atelectasis. Cardiomegaly. Left AICD is unchanged. No confluent opacity on the left. IMPRESSION: Large right pleural effusion with right base atelectasis. No pneumothorax following thoracentesis. Electronically Signed   By: Rolm Baptise M.D.   On: 10/04/2017 15:26   US Thoracentesis Asp Pleural Space W/img Guide  Result Date: 10/04/2017 INDICATION: Coronary artery disease, CHF, pneumonia, cough, COPD, right pleural effusion. Request made for diagnostic and therapeutic right thoracentesis. EXAM: ULTRASOUND GUIDED DIAGNOSTIC AND THERAPEUTIC RIGHT THORACENTESIS MEDICATIONS: None. COMPLICATIONS: None immediate. PROCEDURE: An ultrasound guided thoracentesis was thoroughly discussed with the patient and questions answered. The benefits, risks, alternatives and complications were also discussed. The patient understands and wishes to proceed with the procedure. Written consent was obtained. Ultrasound was performed to localize and mark an adequate pocket of fluid in the right chest. The area was then prepped and draped in the normal sterile fashion. 1% Lidocaine was used for local anesthesia. Under ultrasound guidance a Safe-T-Centesis catheter was introduced. Thoracentesis was performed. The catheter was removed and a dressing applied. FINDINGS: A total of approximately 1.4 liters of hazy, yellow fluid was removed. Samples were sent to the laboratory as requested by the clinical team. The pleural fluid collection was multiloculated. IMPRESSION: Successful ultrasound guided diagnostic and therapeutic right thoracentesis yielding  1.4 liters of pleural fluid. Read by: Rowe Robert, PA-C Electronically Signed   By: Sandi Mariscal M.D.   On: 10/04/2017  16:37     Scheduled Meds: . azithromycin  500 mg Oral Daily  . carvedilol  3.125 mg Oral BID WC  . enoxaparin (LOVENOX) injection  40 mg Subcutaneous Q24H  . furosemide  40 mg Oral Daily  . mouth rinse  15 mL Mouth Rinse BID  . oxybutynin  5 mg Oral BID  . sertraline  50 mg Oral QHS   Continuous Infusions: . ampicillin-sulbactam (UNASYN) IV Stopped (10/06/17 1408)     LOS: 6 days    Time spent: 30 min    Janece Canterbury, MD [facility] Pager [phone]  If 7PM-7AM, please contact night-coverage [email or web address] Password [email or web address] 10/06/2017, 2:58 PM

## 2017-10-07 ENCOUNTER — Encounter: Payer: Self-pay | Admitting: Pulmonary Disease

## 2017-10-07 ENCOUNTER — Inpatient Hospital Stay (HOSPITAL_COMMUNITY): Payer: Medicare Other

## 2017-10-07 LAB — COMPREHENSIVE METABOLIC PANEL
ALBUMIN: 1.9 g/dL — AB (ref 3.5–5.0)
ALT: 94 U/L — ABNORMAL HIGH (ref 17–63)
ANION GAP: 8 (ref 5–15)
AST: 102 U/L — ABNORMAL HIGH (ref 15–41)
Alkaline Phosphatase: 64 U/L (ref 38–126)
BUN: 24 mg/dL — ABNORMAL HIGH (ref 6–20)
CALCIUM: 7.9 mg/dL — AB (ref 8.9–10.3)
CHLORIDE: 98 mmol/L — AB (ref 101–111)
CO2: 29 mmol/L (ref 22–32)
Creatinine, Ser: 0.79 mg/dL (ref 0.61–1.24)
GFR calc non Af Amer: >60 mL/min (ref >60)
GLUCOSE: 102 mg/dL — AB (ref 65–99)
POTASSIUM: 3.9 mmol/L (ref 3.5–5.1)
SODIUM: 135 mmol/L (ref 135–145)
Total Bilirubin: 0.9 mg/dL (ref 0.3–1.2)
Total Protein: 5.5 g/dL — ABNORMAL LOW (ref 6.5–8.1)

## 2017-10-07 LAB — CBC
HCT: 36.3 % — ABNORMAL LOW (ref 39.0–52.0)
HEMOGLOBIN: 12.6 g/dL — AB (ref 13.0–17.0)
MCH: 33.8 pg (ref 26.0–34.0)
MCHC: 34.7 g/dL (ref 30.0–36.0)
MCV: 97.3 fL (ref 78.0–100.0)
PLATELETS: 294 10*3/uL (ref 150–400)
RBC: 3.73 MIL/uL — ABNORMAL LOW (ref 4.22–5.81)
RDW: 13.2 % (ref 11.5–15.5)
WBC: 11.9 10*3/uL — ABNORMAL HIGH (ref 4.0–10.5)

## 2017-10-07 NOTE — Progress Notes (Signed)
PT Cancellation Note  Patient Details Name: [name] MRN: [id number] DOB: [birth year]   Cancelled Treatment:    Reason Eval/Treat Not Completed: Other (comment).  States he is waiting for doctors to decide about a chest tube and does not want to do anything with therapy until then.   Ramond Dial 10/07/2017, 3:32 PM   Mee Hives, PT MS Acute Rehab Dept. Number: [place]

## 2017-10-07 NOTE — Progress Notes (Signed)
   10/07/17 1600  Clinical Encounter Type  Visited With Patient  Visit Type Initial  Stress Factors  Patient Stress Factors Health changes   Rounding on patients on the Palliative list.  Patient was resting his eyes.  I could tell he had a lot on his mind, but he seemed to not want to verbalize what is going on.  He spoke of his 2 daughters and his hope to get back to his home and his dog.  Seemed worried, but thankful for his daughters.  Does not belong to a faith community.  Will follow as needed. Chaplain Katherene Ponto

## 2017-10-07 NOTE — Progress Notes (Signed)
PROGRESS NOTE  [name]  HWE:[id number] DOB: [birth year] DOA: 09/30/2017 PCP: Noralee Space, MD  Brief Narrative:   83 y.o.malewith medical history significant ofchronic combined diastolic and systolic congestive heart failure, CAD, hypertension, hyperlipidemia, vascular disease who presented to the emergency department with complaints of increased cough and congestion started 2 weeks prior to hospital admission.  Chest x-ray demonstrated a right lower lobe pneumonia and right pleural effusion.  He was started on antibiotics for community-acquired pneumonia and IV diuretics however he has had increasing oxygen requirements and his chest x-ray shows that his effusion is worsening despite diuresis.  CT chest demonstrated a moderate right-sided pleural effusion without loculation.  There are areas of hypoattenuation in the right lung that could represent pneumonia, necrosis, or possible neoplasm.  There was also a possible mucoid impaction at the left apex.  The patient underwent thoracentesis on 12/14.  The fluid was exudative and loculated.  Pulmonology and palliative care have been consulted.  His antibiotics have been changed to those that can cover anaerobes and since making this change his white blood cell count has started to trend down.  Despite an improvement in his leukocytosis, his chest x-ray continues to look worse.  Patient and his family to discuss [place] on 12/17 at which time they can decide about VATS, chest tubes, or no procedures.  Code status to be addressed.  Plavix has been held, last dose on Friday, so patient could be scheduled for procedure on Tuesday, if he chooses.    Assessment & Plan:   Principal Problem:   Acute hypoxemic respiratory failure (HCC) Active Problems:   Essential hypertension   Carotid arterial disease (HCC)   CAP (community acquired pneumonia)   Cardiomyopathy, ischemic   Subdural hematoma (HCC)   Traumatic subdural hematoma with loss of  consciousness (HCC)   Pulmonary hypertension (HCC)   TBI (traumatic brain injury) ([place])   Thrombocytopenia ([place])   Hypertensive heart disease with heart failure (HCC)   Chronic combined systolic and diastolic CHF (congestive heart failure) (HCC)   COPD with acute bronchitis (HCC)   CHF exacerbation (HCC)   Pressure injury of skin  1. Acute hypoxemic respiratory failure secondary to pleural effusion and aspiration pneumonia, leukocytosis appears to be improving on new antibiotics 1. Suspect due to acute on chronic combined systolic and diastolic CHF, aspiration PNA 2. Continue Unasyn day 3 of anaerobic coverage 3. Blood cultures no growth to date 4. MRSA PCR negative 5. Thoracentesis: Loculated exudative. 6. Pulmonology consultation appreciated 7. Patient and family to decide on possible procedure on 12/17 8. CXR demonstrates a persistent moderate-sized right pleural effusion with increased density in the right perihilar and infrahilar regions 2. HTN, mildly hypotensive 1. Cont very low-dose carvedilol 3. Dysphagia 1. Aspiration precautions 2. Diet advanced by speech therapy today to dysphagia 3 3. Appreciate speech therapy assistance 4. Patient declines feeding tube 4. Acute on chronic combined sys and dias chf exacerbation 1. Most recent 2d echo with EF 30-35% 2. Presenting BNP of 1348, was 496 on 01/22/17 3. Continue oral Lasix 5. COPD 1. No wheezing at this time  6. Hx subdural hematoma 1. Stable at this time  7. Prolonged QTc, paced rhythm 1. QTc of 527. Would avoid QT prolonging medications 8. Hypokalemia secondary to diuretics, resolved with oral potassium repletion 9. Generalized weakness, likely to SNF at discharge 10. Persistent leukocytosis may be secondary to pneumonia, inflammation, malignancy 11. Mild transaminitis, improving 1. Continue to hold pravastatin 2. Holding allopurinol and  amiodarone as well 3. Acute hepatitis panel and CK 16 4. Trend LFTs  DVT  prophylaxis: Lovenox SQ Code Status: Full  Family Communication: Patient at bedside, no family at bedside Disposition Plan:  Awaiting decision about possible chest tube/VATS, then likely to SNF   Consultants:   Pulmonology/critical care  Palliative care  Procedures:   None  Antimicrobials:  Anti-infectives (From admission, onward)   Start     Dose/Rate Route Frequency Ordered Stop   10/06/17 1000  azithromycin (ZITHROMAX) tablet 500 mg  Status:  Discontinued     500 mg Oral Daily 10/05/17 1148 10/06/17 1752   10/06/17 0130  Ampicillin-Sulbactam (UNASYN) 3 g in sodium chloride 0.9 % 100 mL IVPB     3 g 200 mL/hr over 30 Minutes Intravenous Every 6 hours 10/05/17 1838     10/05/17 1845  Ampicillin-Sulbactam (UNASYN) 3 g in sodium chloride 0.9 % 100 mL IVPB     3 g 200 mL/hr over 30 Minutes Intravenous  Once 10/05/17 1838 10/05/17 2031   10/01/17 1100  azithromycin (ZITHROMAX) 500 mg in dextrose 5 % 250 mL IVPB  Status:  Discontinued     500 mg 250 mL/hr over 60 Minutes Intravenous Every 24 hours 09/30/17 1148 10/05/17 1148   10/01/17 1000  cefTRIAXone (ROCEPHIN) 1 g in dextrose 5 % 50 mL IVPB  Status:  Discontinued     1 g 100 mL/hr over 30 Minutes Intravenous Every 24 hours 09/30/17 1148 10/05/17 1826   09/30/17 1015  cefTRIAXone (ROCEPHIN) 1 g in dextrose 5 % 50 mL IVPB     1 g 100 mL/hr over 30 Minutes Intravenous  Once 09/30/17 1006 09/30/17 1056   09/30/17 1015  azithromycin (ZITHROMAX) 500 mg in dextrose 5 % 250 mL IVPB     500 mg 250 mL/hr over 60 Minutes Intravenous  Once 09/30/17 1006 09/30/17 1212       Subjective:  The patient states he would like to go home.  He continues to have a cough but currently cannot cough up his secretions.  There is still some thick secretions seen in the canister on the wall.  He denies problems eating although the nurse states that he coughs frequently when eating.  He has been voiding and having bowel  movements.  Objective: Vitals:   10/06/17 1800 10/06/17 1900 10/07/17 0451 10/07/17 1513  BP:  (!) 116/56 121/60 (!) 113/55  Pulse: 70  61 60  Resp:  17 18 16   Temp:  98.6 F (37 C) 98.7 F (37.1 C) 98 F (36.7 C)  TempSrc:  Oral Oral Oral  SpO2:  99% 98% 97%  Weight:   77 kg (169 lb 12.1 oz)   Height:        Intake/Output Summary (Last 24 hours) at 10/07/2017 1534 Last data filed at 10/07/2017 1520 Gross per 24 hour  Intake 580 ml  Output 550 ml  Net 30 ml   Filed Weights   10/05/17 0418 10/06/17 0529 10/07/17 0451  Weight: 75.8 kg (167 lb 1.7 oz) 78 kg (171 lb 15.3 oz) 77 kg (169 lb 12.1 oz)    Examination:  General exam:  Adult male.  No acute distress.  HEENT:  NCAT, MMM Respiratory system: Diminished breath sounds throughout the right lung fields, coarse rhonchi heard throughout, no focal rales Cardiovascular system: Regular rate and rhythm, normal S1/S2. No murmurs, rubs, gallops or clicks.  Warm extremities Gastrointestinal system: Normal active bowel sounds, soft, nondistended, nontender. MSK:  Normal tone  and bulk, trace bilateral pitting lower extremity edema Neuro: Diffusely weak  Data Reviewed: I have personally reviewed following labs and imaging studies  CBC: Recent Labs  Lab 10/03/17 0256 10/04/17 0318 10/05/17 0352 10/06/17 0605 10/07/17 0426  WBC 17.7* 19.6* 17.2* 14.0* 11.9*  HGB 14.3 13.6 13.3 12.1* 12.6*  HCT 40.2 39.1 39.0 35.2* 36.3*  MCV 97.3 97.0 98.5 98.1 97.3  PLT 203 267 305 292 160   Basic Metabolic Panel: Recent Labs  Lab 10/02/17 0310 10/03/17 0256 10/04/17 0318 10/05/17 0352 10/06/17 0605 10/07/17 0426  NA 132* 131* 132* 134* 135 135  K 3.0* 3.7 2.9* 3.7 3.5 3.9  CL 94* 94* 95* 95* 96* 98*  CO2 29 24 29 29 31 29   GLUCOSE 132* 141* 149* 115* 109* 102*  BUN 35* 40* 43* 40* 31* 24*  CREATININE 0.96 0.95 1.02 0.96 0.96 0.79  CALCIUM 8.2* 8.1* 8.2* 8.3* 8.1* 7.9*  MG 1.8  --  2.0  --   --   --    GFR: Estimated  Creatinine Clearance: 73.2 mL/min (by C-G formula based on SCr of 0.79 mg/dL). Liver Function Tests: Recent Labs  Lab 10/01/17 0335 10/05/17 0352 10/06/17 0605 10/07/17 0426  AST 39 101* 117* 102*  ALT 27 88* 101* 94*  ALKPHOS 69 72 65 64  BILITOT 1.1 0.8 0.8 0.9  PROT 6.5 5.8* 5.9* 5.5*  ALBUMIN 2.6* 2.1* 1.9* 1.9*   No results for input(s): LIPASE, AMYLASE in the last 168 hours. No results for input(s): AMMONIA in the last 168 hours. Coagulation Profile: No results for input(s): INR, PROTIME in the last 168 hours. Cardiac Enzymes: Recent Labs  Lab 10/06/17 0841  CKTOTAL 16*   BNP (last 3 results) Recent Labs    01/22/17 1231  PROBNP 496.0*   HbA1C: No results for input(s): HGBA1C in the last 72 hours. CBG: No results for input(s): GLUCAP in the last 168 hours. Lipid Profile: No results for input(s): CHOL, HDL, LDLCALC, TRIG, CHOLHDL, LDLDIRECT in the last 72 hours. Thyroid Function Tests: No results for input(s): TSH, T4TOTAL, FREET4, T3FREE, THYROIDAB in the last 72 hours. Anemia Panel: No results for input(s): VITAMINB12, FOLATE, FERRITIN, TIBC, IRON, RETICCTPCT in the last 72 hours. Urine analysis:    Component Value Date/Time   COLORURINE YELLOW 04/14/2016 1028   APPEARANCEUR CLEAR 04/14/2016 1028   LABSPEC 1.013 04/14/2016 1028   PHURINE 6.0 04/14/2016 1028   GLUCOSEU NEGATIVE 04/14/2016 1028   HGBUR NEGATIVE 04/14/2016 1028   BILIRUBINUR NEGATIVE 04/14/2016 1028   KETONESUR NEGATIVE 04/14/2016 1028   PROTEINUR NEGATIVE 04/14/2016 1028   UROBILINOGEN 2.0 (H) 10/28/2012 0820   NITRITE NEGATIVE 04/14/2016 1028   LEUKOCYTESUR NEGATIVE 04/14/2016 1028   Sepsis Labs: @LABRCNTIP (procalcitonin:4,lacticidven:4)  ) Recent Results (from the past 240 hour(s))  Culture, blood (routine x 2) Call MD if unable to obtain prior to antibiotics being given     Status: None   Collection Time: 09/30/17  1:37 PM  Result Value Ref Range Status   Specimen Description  BLOOD RIGHT FOREARM  Final   Special Requests   Final    BOTTLES DRAWN AEROBIC AND ANAEROBIC Blood Culture adequate volume   Culture   Final    NO GROWTH 5 DAYS Performed at [facility], [place] [address] [place], [place] [address]    Report Status 10/05/2017 FINAL  Final  MRSA PCR Screening     Status: None   Collection Time: 09/30/17  7:17 PM  Result Value Ref  Range Status   MRSA by PCR NEGATIVE NEGATIVE Final    Comment:        The GeneXpert MRSA Assay (FDA approved for NASAL specimens only), is one component of a comprehensive MRSA colonization surveillance program. It is not intended to diagnose MRSA infection nor to guide or monitor treatment for MRSA infections.   Culture, blood (routine x 2) Call MD if unable to obtain prior to antibiotics being given     Status: None   Collection Time: 09/30/17  7:39 PM  Result Value Ref Range Status   Specimen Description BLOOD RIGHT ANTECUBITAL  Final   Special Requests IN PEDIATRIC BOTTLE Blood Culture adequate volume  Final   Culture   Final    NO GROWTH 5 DAYS Performed at [facility], [place] [address], [place], [place] [address]    Report Status 10/05/2017 FINAL  Final  Body fluid culture     Status: None (Preliminary result)   Collection Time: 10/04/17  4:40 PM  Result Value Ref Range Status   Specimen Description PLEURAL  Final   Special Requests NONE  Final   Gram Stain   Final    ABUNDANT WBC PRESENT,BOTH PMN AND MONONUCLEAR NO ORGANISMS SEEN    Culture   Final    NO GROWTH 3 DAYS Performed at [facility], [place] [address], [place], [place] [address]    Report Status PENDING  Incomplete      Radiology Studies: Dg Chest 2 View  Result Date: 10/07/2017 CLINICAL DATA:  Follow-up of aspiration pneumonia. EXAM: CHEST  2 VIEW COMPARISON:  Chest x-ray of October 06, 2017 FINDINGS: The left lung is adequately inflated. There is a trace of pleural fluid blunting the lateral costophrenic angle on the  left. On the right there is a moderate-sized pleural effusion that has not dramatically changed since the previous study. There is no pneumothorax or mediastinal shift. There is soft tissue fullness in the right perihilar region better defined today. The heart is not enlarged. The pulmonary vascularity is normal. There is calcification in the wall of the thoracic aorta. The ICD is in stable position. There is an old fracture of the posterolateral aspect of the left seventh rib. IMPRESSION: Persistent moderate-sized right pleural effusion and trace left pleural effusion. Persistent increased density in the right perihilar and infrahilar regions partially obscured by the effusion. No pulmonary edema. Thoracic aortic atherosclerosis. Electronically Signed   By: David  Martinique M.D.   On: 10/07/2017 09:05   Dg Chest Port 1 View  Result Date: 10/06/2017 CLINICAL DATA:  83 year old male status post ultrasound-guided right thoracentesis 10/04/2017. EXAM: PORTABLE CHEST 1 VIEW COMPARISON:  10/04/2017 and earlier. FINDINGS: Portable AP semi upright view at 0432 hours. Moderate volume residual right pleural effusion appears stable since 10/04/2017. No pneumothorax or pulmonary edema. Stable cardiac size and mediastinal contours. Stable left chest cardiac AICD. Calcified aortic atherosclerosis. Minimal patchy opacity at the left lung base is increased. Negative visible bowel gas pattern in the upper abdomen. Stable visualized osseous structures. IMPRESSION: 1. Stable moderate residual right pleural effusion since 10/04/2017. 2. Increased mild left lung base atelectasis. Electronically Signed   By: Genevie Ann M.D.   On: 10/06/2017 06:56     Scheduled Meds: . carvedilol  3.125 mg Oral BID WC  . enoxaparin (LOVENOX) injection  40 mg Subcutaneous Q24H  . furosemide  40 mg Oral Daily  . mouth rinse  15 mL Mouth Rinse BID  . oxybutynin  5  mg Oral BID  . sertraline  50 mg Oral QHS   Continuous Infusions: .  ampicillin-sulbactam (UNASYN) IV Stopped (10/07/17 1420)     LOS: 7 days    Time spent: 30 min    Janece Canterbury, MD [facility] Pager [phone]  If 7PM-7AM, please contact night-coverage [email or web address] Password [facility] 10/07/2017, 3:34 PM

## 2017-10-07 NOTE — Progress Notes (Signed)
PCCM Interval Note  I have reviewed the chart, his CXR's.  Reviewed exam with B Ollis today.   CXR with persistent effusion, exudative by recent thora.   I will repeat CXR 12/18, If persists then anticipate pigtail chest tube placement. I will hold his lovenox tonight in event we want to perform.   Baltazar Apo, MD, PhD 10/07/2017, 3:42 PM [facility] [phone] or if no answer [phone]

## 2017-10-08 ENCOUNTER — Encounter (HOSPITAL_COMMUNITY): Payer: Self-pay | Admitting: Diagnostic Radiology

## 2017-10-08 ENCOUNTER — Inpatient Hospital Stay (HOSPITAL_COMMUNITY): Payer: Medicare Other

## 2017-10-08 DIAGNOSIS — J690 Pneumonitis due to inhalation of food and vomit: Principal | ICD-10-CM

## 2017-10-08 DIAGNOSIS — Z515 Encounter for palliative care: Secondary | ICD-10-CM

## 2017-10-08 DIAGNOSIS — J90 Pleural effusion, not elsewhere classified: Secondary | ICD-10-CM

## 2017-10-08 DIAGNOSIS — Z7189 Other specified counseling: Secondary | ICD-10-CM

## 2017-10-08 HISTORY — PX: IR IMAGE GUIDED DRAINAGE BY PERCUTANEOUS CATHETER: IMG5465

## 2017-10-08 LAB — COMPREHENSIVE METABOLIC PANEL
ALBUMIN: 1.9 g/dL — AB (ref 3.5–5.0)
ALT: 82 U/L — ABNORMAL HIGH (ref 17–63)
ANION GAP: 9 (ref 5–15)
AST: 81 U/L — ABNORMAL HIGH (ref 15–41)
Alkaline Phosphatase: 62 U/L (ref 38–126)
BUN: 18 mg/dL (ref 6–20)
CO2: 29 mmol/L (ref 22–32)
Calcium: 8 mg/dL — ABNORMAL LOW (ref 8.9–10.3)
Chloride: 98 mmol/L — ABNORMAL LOW (ref 101–111)
Creatinine, Ser: 0.78 mg/dL (ref 0.61–1.24)
GFR calc Af Amer: >60 mL/min (ref >60)
GFR calc non Af Amer: >60 mL/min (ref >60)
GLUCOSE: 102 mg/dL — AB (ref 65–99)
POTASSIUM: 3.6 mmol/L (ref 3.5–5.1)
SODIUM: 136 mmol/L (ref 135–145)
Total Bilirubin: 0.5 mg/dL (ref 0.3–1.2)
Total Protein: 5.5 g/dL — ABNORMAL LOW (ref 6.5–8.1)

## 2017-10-08 LAB — PROTIME-INR
INR: 1.15
Prothrombin Time: 14.6 seconds (ref 11.4–15.2)

## 2017-10-08 LAB — BODY FLUID CULTURE: Culture: NO GROWTH

## 2017-10-08 LAB — CBC
HEMATOCRIT: 34.3 % — AB (ref 39.0–52.0)
HEMOGLOBIN: 11.7 g/dL — AB (ref 13.0–17.0)
MCH: 33.3 pg (ref 26.0–34.0)
MCHC: 34.1 g/dL (ref 30.0–36.0)
MCV: 97.7 fL (ref 78.0–100.0)
Platelets: 296 10*3/uL (ref 150–400)
RBC: 3.51 MIL/uL — ABNORMAL LOW (ref 4.22–5.81)
RDW: 13.3 % (ref 11.5–15.5)
WBC: 11.6 10*3/uL — AB (ref 4.0–10.5)

## 2017-10-08 LAB — HEPATITIS PANEL, ACUTE
HCV Ab: <0.1 s/co ratio (ref 0.0–0.9)
HEP B C IGM: NEGATIVE
Hep A IgM: NEGATIVE
Hepatitis B Surface Ag: NEGATIVE

## 2017-10-08 MED ORDER — FENTANYL CITRATE (PF) 100 MCG/2ML IJ SOLN
INTRAMUSCULAR | Status: AC
  Filled 2017-10-08: qty 2

## 2017-10-08 MED ORDER — FENTANYL CITRATE (PF) 100 MCG/2ML IJ SOLN
INTRAMUSCULAR | Status: AC | PRN
  Administered 2017-10-08: 25 ug via INTRAVENOUS

## 2017-10-08 MED ORDER — LIDOCAINE HCL 1 % IJ SOLN
INTRAMUSCULAR | Status: AC
  Filled 2017-10-08: qty 20

## 2017-10-08 MED ORDER — LIDOCAINE HCL 1 % IJ SOLN
INTRAMUSCULAR | Status: AC | PRN
  Administered 2017-10-08: 20 mL

## 2017-10-08 MED ORDER — MIDAZOLAM HCL 2 MG/2ML IJ SOLN
INTRAMUSCULAR | Status: AC
  Filled 2017-10-08: qty 2

## 2017-10-08 MED ORDER — MIDAZOLAM HCL 2 MG/2ML IJ SOLN
INTRAMUSCULAR | Status: AC | PRN
  Administered 2017-10-08: 1 mg via INTRAVENOUS

## 2017-10-08 NOTE — Procedures (Signed)
Imaged-guided placement of right chest tube, 12 French drain placed.  Very loculated pleural fluid and able to place catheter in one of larger pockets.  Initially yellow fluid was removed but 20 ml of brown purulent fluid was removed after drain placement.  Attached to Pleur-Evac.  Minimal blood loss and no immediate complication.

## 2017-10-08 NOTE — Progress Notes (Signed)
Appreciate IR assistance with CT guided chest tube.  Will reassess CXR imaging in am. If persistent loculation, will plan to use tPA / pulmozyme to help break up loculation.  Family may request CVTS evaluation if chest tube is not effective.  Reviewed that he would not likely be a candidate for surgical intervention given his multiple co-morbidities.     Daughter [name]) called and updated on plan of care.     Noe Gens, NP-C  Pulmonary & Critical Care Pgr: [phone] or if no answer [phone] 10/08/2017, 4:01 PM

## 2017-10-08 NOTE — Care Management Important Message (Signed)
Important Message  Patient Details  Name: [name] MRN: [id number] Date of Birth: [birth year]   Medicare Important Message Given:  Yes    Kerin Salen 10/08/2017, 11:44 AMImportant Message  Patient Details  Name: [name] MRN: [id number] Date of Birth: 22-Jun-1931   Medicare Important Message Given:  Yes    Kerin Salen 10/08/2017, 11:44 AM

## 2017-10-08 NOTE — Progress Notes (Signed)
Name: [name] MRN: [id number] DOB: Jan 19, 1931    ADMISSION DATE:  09/30/2017 CONSULTATION DATE:  10/05/2017  REFERRING MD :  Dr. Sheran Fava  CHIEF COMPLAINT:  Pleural effusion  HISTORY OF PRESENT ILLNESS:   83 yo male presented with 2 weeks of dyspnea.  Treated for pneumonia.  Found to have Rt effusion.  PMHx of COPD, combined CHF, CAD, HTN, HLD.  SIGNIFICANT EVENTS  12/10 admit 12/13 CT chest with effusion  12/14 Thoracentesis 12/15 PCCM consult  STUDIES:  Echo 12/10 >> mild LVH, EF 30 to 35%, grade 2 DD, mild AS, PAS 43 mmHg CT chest 12/13 >> Moderate R-sided pleural effusion without loculation. Bronchus intermedius compression or obstruction. Resultant right middle lobe and right lower lobe volume loss. Probable mucoid impaction at the left apex. Emphysema. Rt thoracentesis 12/14 >> glucose 85, protein 3.7, LDH 984, WBC 1175 (82%N)  CULTURES: Blood 12/10 >> negative Pleural fluid 12/10 >> negative  ANTIBIOTICS: Rocephin 12/10 >> 12/16 Zithromax 12/10 >> 12/15 Unasyn 12/15 >>   SUBJECTIVE:  Comfortable, minimal SOB or cough today  VITAL SIGNS: BP (!) 87/42   Pulse 60   Temp 98.4 F (36.9 C) (Oral)   Resp 20   Ht 5\' 11"  (1.803 m)   Wt 77.9 kg (171 lb 11.8 oz)   SpO2 100%   BMI 23.95 kg/m   INTAKE/OUTPUT: I/O last 3 completed shifts: In: 1060 [P.O.:360; IV Piggyback:700] Out: 600 [Urine:600]  PHYSICAL EXAMINATION: Gen: Pleasant, well-nourished, in no distress,  normal affect  ENT: No lesions,  mouth clear,  oropharynx clear, no postnasal drip  Neck: No JVD, no TMG, no carotid bruits  Lungs: No use of accessory muscles, bibasilar crackles, decreased on R  Cardiovascular: RRR, heart sounds normal, no murmur or gallops, no peripheral edema  Musculoskeletal: No deformities, no cyanosis or clubbing  Neuro: alert, non focal  Skin: Warm, no lesions or rashes    LABS: CMP Latest Ref Rng & Units 10/08/2017 10/07/2017 10/06/2017  Glucose 65 - 99  mg/dL 102(H) 102(H) 109(H)  BUN 6 - 20 mg/dL 18 24(H) 31(H)  Creatinine 0.61 - 1.24 mg/dL 0.78 0.79 0.96  Sodium 135 - 145 mmol/L 136 135 135  Potassium 3.5 - 5.1 mmol/L 3.6 3.9 3.5  Chloride 101 - 111 mmol/L 98(L) 98(L) 96(L)  CO2 22 - 32 mmol/L 29 29 31   Calcium 8.9 - 10.3 mg/dL 8.0(L) 7.9(L) 8.1(L)  Total Protein 6.5 - 8.1 g/dL 5.5(L) 5.5(L) 5.9(L)  Total Bilirubin 0.3 - 1.2 mg/dL 0.5 0.9 0.8  Alkaline Phos 38 - 126 U/L 62 64 65  AST 15 - 41 U/L 81(H) 102(H) 117(H)  ALT 17 - 63 U/L 82(H) 94(H) 101(H)   CBC Latest Ref Rng & Units 10/08/2017 10/07/2017 10/06/2017  WBC 4.0 - 10.5 K/uL 11.6(H) 11.9(H) 14.0(H)  Hemoglobin 13.0 - 17.0 g/dL 11.7(L) 12.6(L) 12.1(L)  Hematocrit 39.0 - 52.0 % 34.3(L) 36.3(L) 35.2(L)  Platelets 150 - 400 K/uL 296 294 292   IMAGING: Dg Chest 2 View  Result Date: 10/07/2017 CLINICAL DATA:  Follow-up of aspiration pneumonia. EXAM: CHEST  2 VIEW COMPARISON:  Chest x-ray of October 06, 2017 FINDINGS: The left lung is adequately inflated. There is a trace of pleural fluid blunting the lateral costophrenic angle on the left. On the right there is a moderate-sized pleural effusion that has not dramatically changed since the previous study. There is no pneumothorax or mediastinal shift. There is soft tissue fullness in the right perihilar region better defined today. The  heart is not enlarged. The pulmonary vascularity is normal. There is calcification in the wall of the thoracic aorta. The ICD is in stable position. There is an old fracture of the posterolateral aspect of the left seventh rib. IMPRESSION: Persistent moderate-sized right pleural effusion and trace left pleural effusion. Persistent increased density in the right perihilar and infrahilar regions partially obscured by the effusion. No pulmonary edema. Thoracic aortic atherosclerosis. Electronically Signed   By: David  Martinique M.D.   On: 10/07/2017 09:05   Dg Chest Port 1 View  Result Date:  10/08/2017 CLINICAL DATA:  Pleural effusion. EXAM: PORTABLE CHEST 1 VIEW COMPARISON:  Radiographs of October 07, 2017. FINDINGS: Stable cardiomegaly. Atherosclerosis of thoracic aorta is noted. Left-sided pacemaker is unchanged in position. No pneumothorax is noted. Stable large right pleural effusion is noted with associated atelectasis or infiltrate. Bony thorax is unremarkable. IMPRESSION: Aortic atherosclerosis. Stable large right pleural effusion is noted with associated atelectasis or infiltrate. Electronically Signed   By: Marijo Conception, M.D.   On: 10/08/2017 08:24    DISCUSSION: 83 yo male with probable aspiration pneumonia causing parapneumonia right pleural effusion.  He had antibiotics changed to have better coverage for aspiration on 12/15.  Palliative care has been consulted.  He has history of COPD with emphysema and combined CHF.  ASSESSMENT / PLAN:  Exudate, neutrophil predominant Rt pleural effusion likely from recurrent aspiration pneumonia. Continue unasyn Agree with R thoracentesis by IR if we can believe we can achieve adequate drainage, alternatively place indwelling pigtail catheter. Follow imaging to decide whether DNase / lytics may be helpful with loculated effusion.   Acute hypoxic respiratory failure. Wean O2 as able.   COPD with emphysema. BD's as ordered  DVT prophylaxis - enoxaparin held, can restart 12/19 SUP - not indicated Nutrition - D3 diet Goals of care - full code   Baltazar Apo, MD, PhD 10/08/2017, 2:45 PM [facility] [phone] or if no answer [phone]

## 2017-10-08 NOTE — Progress Notes (Signed)
Referring Physician(s): Byrum,R  Supervising Physician: Markus Daft  Patient Status:  [name] - In-pt  Chief Complaint:  Loculated right  pleural effusion  Subjective: Patient familiar to IR service from prior T12 kyphoplasty in 2009 and right thoracentesis on 10/04/17 yielding 1.4 L fluid.  Fluid cultures were negative.  Cytology pending.  Pleural fluid collection was multiloculated at the time and now persists.  Request received from pulmonology for right pleural pigtail drain catheter placement.  Additional history as below. Past Medical History:  Diagnosis Date  . AAA (abdominal aortic aneurysm) ([place])    a. 2002 s/p repair.  . Abnormal liver function tests   . AICD (automatic cardioverter/defibrillator) present   . Allergic rhinitis   . Atherosclerotic heart disease   . Back pain   . CAD (coronary artery disease)    a. 02/2002 H/o MI with stenting x 2; b. 04/2013 MV: EF 25%, large posterior lateral infarct w/o ischemia; c. 03/2016 VT Arrest/Cath: LM 60ost, LAD 40p/m ISR, LCX 100p/m, RCA nl-->Med Rx.  . Carotid arterial disease ([place])    a. 12/2010 s/p R CEA;  b. 05/2015 Carotid U/S: bilat <40% ICA stenosis.  . Chronic combined systolic and diastolic CHF (congestive heart failure) ([place])    a. 03/2016 Echo: Ef 35-40%, grade 1 DD.  Marland Kitchen Compression fracture   . COPD (chronic obstructive pulmonary disease) ([place])   . Coronary artery disease   . Dermatitis   . Diverticulosis of colon   . DJD (degenerative joint disease)    and Gout  . Hemorrhoids   . History of colonic polyps   . History of gout   . History of pneumonia   . Hypercholesterolemia   . Hypertension   . Hypertensive heart disease   . Ischemic cardiomyopathy    a. EF prev <35%-->improved to normal by Echo 8/14:  Mild LVH, focal basal hypertrophy, EF 60-65%, normal wall motion, mild BAE, PASP 36; c. 03/2016 Echo: EF 35-40%, Gr1 DD, triv AI, mild MR, mod dil LA, mild-mod TR, PASP 28mmHg.  . Osteoporosis   . Peripheral  vascular disease ([place])   . Presence of cardiac defibrillator    a. 03/2008 s/p MDT [id number] Maximo II DR, DC AICD; b. 03/2013: ICD shock for T wave oversensing;  c. 10/2015: collective decision not to replace ICD given improvement in LV fxn; d. VT Arrest-->Gen change to MDT ser # [id number].  . Skin cancer    shoulders and forehead  . Syncope   . T wave over sensing resulting in inappropriate shocks    a. 03/2013.  . Tobacco abuse   . Ventricular tachycardia ([place]) 04/01/2016   Past Surgical History:  Procedure Laterality Date  . ABDOMINAL AORTIC ANEURYSM REPAIR  2002   by Dr. Kellie Simmering  . aicd placed  03/2008   Dr. Caryl Comes  . cad stent  02/2002   Dr. Percival Spanish  . CARDIAC CATHETERIZATION     X 2 stents  . CARDIAC CATHETERIZATION N/A 04/03/2016   Procedure: Left Heart Cath and Coronary Angiography;  Surgeon: Belva Crome, MD;  Location: [place] [facility];  Service: Cardiovascular;  Laterality: N/A;  . CARDIAC DEFIBRILLATOR PLACEMENT  2009  . CARDIAC DEFIBRILLATOR PLACEMENT    . CAROTID ENDARTERECTOMY Right January 02, 2011  . EAR CYST EXCISION Left 12/13/2015   Procedure: Excision left ear lesion ;  Surgeon: Melissa Montane, MD;  Location: [place];  Service: ENT;  Laterality: Left;  . EP IMPLANTABLE DEVICE N/A 04/06/2016   Procedure:  ICD Generator Changeout;  Surgeon: Deboraha Sprang, MD;  Location: [place] [facility];  Service: Cardiovascular;  Laterality: N/A;  . EXCISION OF LESION LEFT EAR Left 12/13/2015  . I&D EXTREMITY Left 04/23/2016   Procedure: IRRIGATION AND DEBRIDEMENT HAND;  Surgeon: Leandrew Koyanagi, MD;  Location: [place];  Service: Orthopedics;  Laterality: Left;  . OPEN REDUCTION INTERNAL FIXATION (ORIF) METACARPAL Left 04/04/2016   Procedure: OPEN REDUCTION INTERNAL FIXATION (ORIF) LEFT 2ND, 3RD, 4TH METACARPAL FRACTURE;  Surgeon: Leandrew Koyanagi, MD;  Location: [place];  Service: Orthopedics;  Laterality: Left;  OPEN REDUCTION INTERNAL FIXATION (ORIF) LEFT 2ND, 3RD, 4TH METACARPAL FRACTURE  . OPEN  REDUCTION INTERNAL FIXATION (ORIF) METACARPAL Left 04/23/2016   Procedure: REVISION OPEN REDUCTION INTERNAL FIXATION (ORIF) 2ND METACARPAL;  Surgeon: Leandrew Koyanagi, MD;  Location: [place];  Service: Orthopedics;  Laterality: Left;  . right corotid enderectomy  12/2010   Dr. Scot Dock  . SKIN SPLIT GRAFT Left 12/13/2015   Procedure: with possible skin graft;  Surgeon: Melissa Montane, MD;  Location: [place];  Service: ENT;  Laterality: Left;  . TONSILLECTOMY        Allergies: Lisinopril  Medications: Prior to Admission medications   Medication Sig Start Date End Date Taking? Authorizing Provider  albuterol (PROVENTIL HFA;VENTOLIN HFA) 108 (90 Base) MCG/ACT inhaler Inhale 2 puffs into the lungs every 6 (six) hours as needed for wheezing or shortness of breath. 01/14/17  Yes Rizwan, Eunice Blase, MD  allopurinol (ZYLOPRIM) 100 MG tablet TAKE 1 TABLET ONCE DAILY. 09/16/17  Yes Noralee Space, MD  carvedilol (COREG) 3.125 MG tablet TAKE 1 TABLET TWICE DAILY WITH A MEAL. 06/10/17  Yes Minus Breeding, MD  cholecalciferol (VITAMIN D) 1000 units tablet Take 1,000 Units by mouth daily.   Yes [provider]  clopidogrel (PLAVIX) 75 MG tablet TAKE 1 TABLET ONCE DAILY. 06/25/17  Yes Noralee Space, MD  Fluticasone-Salmeterol [facility] RESPICLICK 128/78) 676-72 MCG/ACT AEPB Inhale 2 puffs into the lungs 2 (two) times daily. 09/18/17  Yes Noralee Space, MD  furosemide (LASIX) 40 MG tablet TAKE 1 TABLET DAILY AS NEEDED. (FOR WEIGHT GAIN) Patient taking differently: Take one tablet by mouth every other day. 06/25/17  Yes Noralee Space, MD  guaiFENesin (MUCINEX) 600 MG 12 hr tablet Take 600-1,200 mg by mouth 2 (two) times daily. Takes two in the morning and one in the evening.   Yes [provider]  levothyroxine (SYNTHROID, LEVOTHROID) 25 MCG tablet TAKE 1 TABLET IN THE MORNING ON AN EMPTY STOMACH. 07/08/17  Yes Noralee Space, MD  magnesium oxide (MAG-OX) 400 (241.3 Mg) MG tablet Take 0.5 tablets (200 mg total) by  mouth daily. 04/27/16  Yes Love, Ivan Anchors, PA-C  Multiple Vitamin (MULTIVITAMIN) capsule Take 1 capsule by mouth daily.   Yes [provider]  Multiple Vitamins-Minerals (PRESERVISION/LUTEIN) CAPS Take 1 capsule by mouth 2 (two) times daily.    Yes [provider]  oxybutynin (DITROPAN) 5 MG tablet TAKE 1 TABLET BY MOUTH TWICE DAILY. 09/02/17  Yes Noralee Space, MD  polyethylene glycol powder (GLYCOLAX/MIRALAX) powder DISSOLVE ONE CAPFUL IN 8 0Z. WATER TWICE DAILY. 07/23/17  Yes Noralee Space, MD  potassium chloride SA (K-DUR,KLOR-CON) 20 MEQ tablet TAKE 1 TABLET DAILY AS NEEDED. (TAKE EACH TIME YOU TAKE FUROSEMIDE) Patient taking differently: Take one tablet by mouth every other day. 04/29/17  Yes Noralee Space, MD  pravastatin (PRAVACHOL) 40 MG tablet TAKE 1 TABLET ONCE DAILY. 08/13/17  Yes  Noralee Space, MD  sertraline (ZOLOFT) 50 MG tablet TAKE ONE TABLET AT BEDTIME. 09/02/17  Yes Noralee Space, MD  thiamine 100 MG tablet Take 1 tablet (100 mg total) by mouth daily. 04/26/16  Yes Love, Ivan Anchors, PA-C  ADVAIR DISKUS 250-50 MCG/DOSE AEPB Inhale 1 puff into the lungs 2 (two) times daily. 07/23/17   [provider]  alendronate (FOSAMAX) 70 MG tablet Take 1 tablet (70 mg total) by mouth once a week. Take with a full glass of water on an empty stomach. 07/10/17   Noralee Space, MD  amiodarone (PACERONE) 200 MG tablet TAKE 1 TABLET ONCE DAILY. 10/02/17   Noralee Space, MD  clotrimazole-betamethasone (LOTRISONE) cream Apply 1 application topically 2 (two) times daily. Patient not taking: Reported on 09/30/2017 06/18/17   Noralee Space, MD     Vital Signs: BP 139/70 (BP Location: Right Arm)   Pulse 63   Temp 98.4 F (36.9 C) (Oral)   Resp 18   Ht 5\' 11"  (1.803 m)   Wt 171 lb 11.8 oz (77.9 kg)   SpO2 98%   BMI 23.95 kg/m   Physical Exam  patient awake/alert, hard of hearing, chest with diminished breath sounds on right, scattered rhonchi throughout.  Heart with  regular rate and rhythm.  Abdomen soft, positive bowel sounds, nontender.  Trace lower extremity edema  Imaging: Dg Chest 2 View  Result Date: 10/07/2017 CLINICAL DATA:  Follow-up of aspiration pneumonia. EXAM: CHEST  2 VIEW COMPARISON:  Chest x-ray of October 06, 2017 FINDINGS: The left lung is adequately inflated. There is a trace of pleural fluid blunting the lateral costophrenic angle on the left. On the right there is a moderate-sized pleural effusion that has not dramatically changed since the previous study. There is no pneumothorax or mediastinal shift. There is soft tissue fullness in the right perihilar region better defined today. The heart is not enlarged. The pulmonary vascularity is normal. There is calcification in the wall of the thoracic aorta. The ICD is in stable position. There is an old fracture of the posterolateral aspect of the left seventh rib. IMPRESSION: Persistent moderate-sized right pleural effusion and trace left pleural effusion. Persistent increased density in the right perihilar and infrahilar regions partially obscured by the effusion. No pulmonary edema. Thoracic aortic atherosclerosis. Electronically Signed   By: David  Martinique M.D.   On: 10/07/2017 09:05   Dg Chest Port 1 View  Result Date: 10/08/2017 CLINICAL DATA:  Pleural effusion. EXAM: PORTABLE CHEST 1 VIEW COMPARISON:  Radiographs of October 07, 2017. FINDINGS: Stable cardiomegaly. Atherosclerosis of thoracic aorta is noted. Left-sided pacemaker is unchanged in position. No pneumothorax is noted. Stable large right pleural effusion is noted with associated atelectasis or infiltrate. Bony thorax is unremarkable. IMPRESSION: Aortic atherosclerosis. Stable large right pleural effusion is noted with associated atelectasis or infiltrate. Electronically Signed   By: Marijo Conception, M.D.   On: 10/08/2017 08:24   Dg Chest Port 1 View  Result Date: 10/06/2017 CLINICAL DATA:  83 year old male status post  ultrasound-guided right thoracentesis 10/04/2017. EXAM: PORTABLE CHEST 1 VIEW COMPARISON:  10/04/2017 and earlier. FINDINGS: Portable AP semi upright view at 0432 hours. Moderate volume residual right pleural effusion appears stable since 10/04/2017. No pneumothorax or pulmonary edema. Stable cardiac size and mediastinal contours. Stable left chest cardiac AICD. Calcified aortic atherosclerosis. Minimal patchy opacity at the left lung base is increased. Negative visible bowel gas pattern in the upper abdomen. Stable visualized osseous structures. IMPRESSION:  1. Stable moderate residual right pleural effusion since 10/04/2017. 2. Increased mild left lung base atelectasis. Electronically Signed   By: Genevie Ann M.D.   On: 10/06/2017 06:56   Dg Chest Port 1 View  Result Date: 10/04/2017 CLINICAL DATA:  Post thoracentesis EXAM: PORTABLE CHEST 1 VIEW COMPARISON:  10/03/2017 FINDINGS: Large right pleural effusion remains. No pneumothorax following thoracentesis. Right lower lobe atelectasis. Cardiomegaly. Left AICD is unchanged. No confluent opacity on the left. IMPRESSION: Large right pleural effusion with right base atelectasis. No pneumothorax following thoracentesis. Electronically Signed   By: Rolm Baptise M.D.   On: 10/04/2017 15:26   US Thoracentesis Asp Pleural Space W/img Guide  Result Date: 10/04/2017 INDICATION: Coronary artery disease, CHF, pneumonia, cough, COPD, right pleural effusion. Request made for diagnostic and therapeutic right thoracentesis. EXAM: ULTRASOUND GUIDED DIAGNOSTIC AND THERAPEUTIC RIGHT THORACENTESIS MEDICATIONS: None. COMPLICATIONS: None immediate. PROCEDURE: An ultrasound guided thoracentesis was thoroughly discussed with the patient and questions answered. The benefits, risks, alternatives and complications were also discussed. The patient understands and wishes to proceed with the procedure. Written consent was obtained. Ultrasound was performed to localize and mark an adequate  pocket of fluid in the right chest. The area was then prepped and draped in the normal sterile fashion. 1% Lidocaine was used for local anesthesia. Under ultrasound guidance a Safe-T-Centesis catheter was introduced. Thoracentesis was performed. The catheter was removed and a dressing applied. FINDINGS: A total of approximately 1.4 liters of hazy, yellow fluid was removed. Samples were sent to the laboratory as requested by the clinical team. The pleural fluid collection was multiloculated. IMPRESSION: Successful ultrasound guided diagnostic and therapeutic right thoracentesis yielding 1.4 liters of pleural fluid. Read by: Rowe Robert, PA-C Electronically Signed   By: Sandi Mariscal M.D.   On: 10/04/2017 16:37    Labs:  CBC: Recent Labs    10/05/17 0352 10/06/17 0605 10/07/17 0426 10/08/17 0423  WBC 17.2* 14.0* 11.9* 11.6*  HGB 13.3 12.1* 12.6* 11.7*  HCT 39.0 35.2* 36.3* 34.3*  PLT 305 292 294 296    COAGS: No results for input(s): INR, APTT in the last 8760 hours.  BMP: Recent Labs    10/05/17 0352 10/06/17 0605 10/07/17 0426 10/08/17 0423  NA 134* 135 135 136  K 3.7 3.5 3.9 3.6  CL 95* 96* 98* 98*  CO2 29 31 29 29   GLUCOSE 115* 109* 102* 102*  BUN 40* 31* 24* 18  CALCIUM 8.3* 8.1* 7.9* 8.0*  CREATININE 0.96 0.96 0.79 0.78  GFRNONAA >60 >60 >60 >60  GFRAA >60 >60 >60 >60    LIVER FUNCTION TESTS: Recent Labs    10/05/17 0352 10/06/17 0605 10/07/17 0426 10/08/17 0423  BILITOT 0.8 0.8 0.9 0.5  AST 101* 117* 102* 81*  ALT 88* 101* 94* 82*  ALKPHOS 72 65 64 62  PROT 5.8* 5.9* 5.5* 5.5*  ALBUMIN 2.1* 1.9* 1.9* 1.9*    Assessment and Plan: Patient with history of COPD, CHF, pneumonia, persistent loculated right pleural effusion; request received for right pleural pigtail drain /catheter placement; imaging studies have been reviewed by Dr. Anselm Pancoast; details/risks of procedure, including but not limited to, internal bleeding, infection, need for prolonged drainage, need for  possible thoracic surgery discussed with patient and daughter with their understanding and consent.  Procedure tentatively scheduled for later this afternoon.   Electronically Signed: D. Rowe Robert, PA-C 10/08/2017, 11:11 AM   I spent a total of 30 minutes at the the patient's bedside AND on the patient's hospital  floor or unit, greater than 50% of which was counseling/coordinating care for right chest drain placement    Patient ID: [name], male   DOB: [birth year], 83 y.o.   MRN: [id number]

## 2017-10-08 NOTE — Progress Notes (Signed)
Daily Progress Note   Patient Name: [name]       Date: 10/08/2017 DOB: 1931/02/27  Age: 83 y.o. MRN#: [id number] Attending Physician: Janece Canterbury, MD Primary Care Physician: Noralee Space, MD Admit Date: 09/30/2017  Reason for Consultation/Follow-up: Establishing goals of care  Subjective: Chart reviewed and I met this morning with [name].  I also met again with him later in the morning once his daughter, [name], arrived at the hospital.  We discussed his clinical course over the last several days as well as pathways forward in regard to his care.  He confirmed that the most important things to him or his family, being at home, and his dog.  We discussed plan to continue to focus on interventions that are likely to allow him to eventually become well enough to return to his home.  We also began discussion about potential for limiting interventions that are not likely to allow him to achieve this goal.  His daughter reports that she remembers these conversations from when her mother died with hospice services.  His daughter also reports that he had completed healthcare power of attorney paperwork a couple of years ago with his other daughter, [name], who is a Chief Executive Officer.  She is not clear exactly what it says but her understanding was that the 3 children share healthcare power of attorney.  Discussed options for care moving forward in the short-term including risk versus benefit of pigtail catheter placement.  This morning, [name] reports that this is something that he is agreeable to and would like to proceed.  Length of Stay: 8  Current Medications: Scheduled Meds:  . carvedilol  3.125 mg Oral BID WC  . fentaNYL      . furosemide  40 mg Oral Daily  . lidocaine      .  mouth rinse  15 mL Mouth Rinse BID  . midazolam      . oxybutynin  5 mg Oral BID  . sertraline  50 mg Oral QHS    Continuous Infusions: . ampicillin-sulbactam (UNASYN) IV Stopped (10/08/17 0607)    PRN Meds: albuterol, guaiFENesin-dextromethorphan, RESOURCE THICKENUP CLEAR, traMADol  Physical Exam       General: Alert, awake, in no acute distress.  HEENT: No bruits, no goiter, no JVD Heart: Regular rate and rhythm.  No murmur appreciated. Lungs: Throughout.  Diminished, particularly the right base Abdomen: Soft, nontender, nondistended, positive bowel sounds.  Ext: Trace edema Skin: Warm and dry Neuro: Grossly intact, nonfocal.  Vital Signs: BP (!) 87/42   Pulse 60   Temp 98.4 F (36.9 C) (Oral)   Resp 20   Ht _0  (1.803 m)   Wt 77.9 kg (171 lb 11.8 oz)   SpO2 100%   BMI 23.95 kg/m  SpO2: SpO2: 100 % O2 Device: O2 Device: Nasal Cannula O2 Flow Rate: O2 Flow Rate (L/min): 2 L/min  Intake/output summary:   Intake/Output Summary (Last 24 hours) at 10/08/2017 1525 Last data filed at 10/08/2017 1116 Gross per 24 hour  Intake 720 ml  Output 450 ml  Net 270 ml   LBM: Last BM Date: 10/05/17 Baseline Weight: Weight: 77.1 kg (170 lb) Most recent weight: Weight: 77.9 kg (171 lb 11.8 oz)       Palliative Assessment/Data:    Flowsheet Rows     Most Recent Value  Intake Tab  Referral Department  Hospitalist  Unit at Time of Referral  Med/Surg Unit  Palliative Care Primary Diagnosis  Neurology  Date Notified  10/05/17  Palliative Care Type  New Palliative care  Reason for referral  Clarify Goals of Care  Date of Admission  09/30/17  Date first seen by Palliative Care  10/06/17  # of days Palliative referral response time  1 Day(s)  # of days IP prior to Palliative referral  5  Clinical Assessment  Psychosocial & Spiritual Assessment  Palliative Care Outcomes      Patient Active Problem List   Diagnosis Date Noted  . Pleural effusion   . Pressure  injury of skin 10/01/2017  . Acute hypoxemic respiratory failure ([place]) 09/30/2017  . CHF exacerbation ([place]) 09/30/2017  . Shingles outbreak 05/27/2017  . Seborrheic dermatitis 05/27/2017  . Hypotension due to drugs 04/01/2017  . Acute on chronic respiratory failure with hypoxia ([place]) 01/13/2017  . COPD with acute bronchitis ([place]) 01/13/2017  . CAD S/P percutaneous coronary angioplasty   . Chronic combined systolic and diastolic CHF (congestive heart failure) ([place])   . Blunt chest trauma 05/04/2016  . 4.1 cm Aneurysm of thoracic aorta ([place]) 04/27/2016  . SIADH (syndrome of inappropriate ADH production) ([place])   . Hypertensive heart disease with heart failure ([place])   . Orthostatic hypotension 04/12/2016  . TBI (traumatic brain injury) ([place])   . Acute on chronic combined systolic and diastolic congestive heart failure ([place])   . S/P ORIF (open reduction internal fixation) fracture   . Thrombocytopenia ([place])   . Urinary retention   . Slow transit constipation   . Thyroid activity decreased   . IVCD (intraventricular conduction defect) 04/05/2016  . Traumatic subdural hematoma with loss of consciousness ([place])   . Pulmonary hypertension ([place])   . Cardiomyopathy, ischemic   . Subdural hematoma ([place])   . Ventricular tachycardia ([place]) 04/01/2016  . Cardiac arrest ([place]) 03/29/2016  . 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 CAP (community acquired pneumonia) 04/28/2015  . T wave over sensing resulting in inappropriate shocks 08/14/2013  . Vitamin D deficiency disease 04/09/2012  . Actinic skin damage 05/22/2011  . Alcohol use 03/22/2011  . Other dysphagia 02/16/2011  . Carotid arterial disease ([place]) 11/17/2009  . GOUT 09/23/2009  . SYNCOPE 04/19/2009  . Closed fracture of bone 07/27/2008  . Automatic implantable cardioverter-defibrillator in situ 04/13/2008  . COLONIC POLYPS 12/09/2007  . DIVERTICULOSIS  OF COLON 12/09/2007  . Essential hypertension 12/08/2007  . Coronary  atherosclerosis 12/08/2007  . Peripheral vascular disease ([place]) 12/08/2007  . COPD GOLD III 12/08/2007  . 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  Palliative have consulted for goals of care.  Assessment: Patient Active Problem List   Diagnosis Date Noted  . Pleural effusion   . Pressure injury of skin 10/01/2017  . Acute hypoxemic respiratory failure ([place]) 09/30/2017  . CHF exacerbation ([place]) 09/30/2017  . Shingles outbreak 05/27/2017  . Seborrheic dermatitis 05/27/2017  . Hypotension due to drugs 04/01/2017  . Acute on chronic respiratory failure with hypoxia ([place]) 01/13/2017  . COPD with acute bronchitis ([place]) 01/13/2017  . CAD S/P percutaneous coronary angioplasty   . Chronic combined systolic and diastolic CHF (congestive heart failure) ([place])   . Blunt chest trauma 05/04/2016  . 4.1 cm Aneurysm of thoracic aorta ([place]) 04/27/2016  . SIADH (syndrome of inappropriate ADH production) ([place])   . Hypertensive heart disease with heart failure ([place])   . Orthostatic hypotension 04/12/2016  . TBI (traumatic brain injury) ([place])   . Acute on chronic combined systolic and diastolic congestive heart failure ([place])   . S/P ORIF (open reduction internal fixation) fracture   . Thrombocytopenia ([place])   . Urinary retention   . Slow transit constipation   . Thyroid activity decreased   . IVCD (intraventricular conduction defect) 04/05/2016  . Traumatic subdural hematoma with loss of consciousness ([place])   . Pulmonary hypertension ([place])   . Cardiomyopathy, ischemic   . Subdural hematoma ([place])     . Ventricular tachycardia ([place]) 04/01/2016  . Cardiac arrest ([place]) 03/29/2016  . 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 CAP (community acquired pneumonia) 04/28/2015  . T wave over sensing resulting in inappropriate shocks 08/14/2013  . Vitamin D deficiency disease 04/09/2012  . Actinic skin damage 05/22/2011  . Alcohol use 03/22/2011  . Other dysphagia 02/16/2011  . Carotid arterial disease ([place]) 11/17/2009  . GOUT 09/23/2009  . SYNCOPE 04/19/2009  . Closed fracture of bone 07/27/2008  . Automatic implantable cardioverter-defibrillator in situ 04/13/2008  . COLONIC POLYPS 12/09/2007  . DIVERTICULOSIS OF COLON 12/09/2007  . Essential hypertension 12/08/2007  . Coronary atherosclerosis 12/08/2007  . Peripheral vascular disease ([place]) 12/08/2007  . COPD GOLD III 12/08/2007  . Osteoarthritis 12/08/2007   Recommendations/Plan:  Mr. Tardif would like to proceed with placement of pigtail catheter.  He is not open to more invasive interventions than this, however.  His daughter reports that his children share healthcare power of attorney rather than just being 1 of his daughters.  She reports that another daughter who is a Chief Executive Officer filled out the paperwork couple of years ago.  She will obtain a copy of it for our records in order to clarify if the paperwork he completed does match with his wishes for decision making in the event that he is not able to make decisions for himself.  We began discussion about goals of care in the event of continued decline and decompensation.  He reports this is something he needs to consider further but remembers discussing similar things when his wife entered into hospice care.  I left a copy of hard choices for loving People for him and his family to review.  We will plan to follow-up in the next day or 2 once pigtail catheter is placed. Hopefully once he is feeling better he will emotionally be in a place to discuss further at that time  Goals  of Care and Additional Recommendations:  Limitations on Scope of Treatment: Full Scope Treatment  Code Status:    Code Status Orders  (From admission, onward)        Start     Ordered   09/30/17 1148  Full code  Continuous     09/30/17 1148    Code Status History    Date Active Date Inactive Code Status Order ID Comments User Context   01/12/2017 20:26 01/14/2017 23:22 Full Code [id number]  Caren Griffins, MD Inpatient   04/09/2016 16:36 04/27/2016 15:45 Full Code [id number]  Bary Leriche, PA-C Inpatient   03/29/2016 09:38 04/09/2016 16:36 Full Code [id number]  Awilda Bill, NP ED   12/13/2015 13:32 12/14/2015 13:04 Full Code [id number]  Melissa Montane, MD Inpatient    Advance Directive Documentation     Most Recent Value  Type of Advance Directive  Healthcare Power of Attorney  Pre-existing out of facility DNR order (yellow form or pink MOST form)  No data  "MOST" Form in Place?  No data       Prognosis:   Unable to determine  Discharge Planning:  To Be Determined  Care plan was discussed with patient, daughter [name]), bedside RN  Thank you for allowing the Palliative Medicine Team to assist in the care of this patient.   Total Time 40 Prolonged Time Billed No      Greater than 50%  of this time was spent counseling and coordinating care related to the above assessment and plan.  Micheline Rough, MD  Please contact Palliative Medicine Team phone at [phone] for questions and concerns.

## 2017-10-08 NOTE — Progress Notes (Signed)
SLP Cancellation Note  Patient Details Name: [name] MRN: [id number] DOB: 1930-11-18   Cancelled treatment:       Reason Eval/Treat Not Completed: Patient sleeping soundly; will return in am.   [name] 10/08/2017, 2:57 PM

## 2017-10-08 NOTE — Progress Notes (Signed)
PT Cancellation Note  Patient Details Name: [name] MRN: [id number] DOB: 22-Sep-1931   Cancelled Treatment:    Reason Eval/Treat Not Completed: Other (comment).  Gone to have chest tube placed and will try later as time and pt allow.   Ramond Dial 10/08/2017, 1:11 PM   Mee Hives, PT MS Acute Rehab Dept. Number: [place] [facility] [place]

## 2017-10-08 NOTE — Progress Notes (Signed)
PROGRESS NOTE  [name]  KDT:[id number] DOB: [birth year] DOA: 09/30/2017 PCP: Noralee Space, MD  Brief Narrative:   83 y.o.malewith medical history significant ofchronic combined diastolic and systolic congestive heart failure, CAD, hypertension, hyperlipidemia, vascular disease who presented to the emergency department with complaints of increased cough and congestion started 2 weeks prior to hospital admission.  Chest x-ray demonstrated a right lower lobe pneumonia and right pleural effusion.  He did not improve with antibiotics and diuresis.  CT chest demonstrated a moderate right-sided pleural effusion without loculation.  He underwent thoracentesis on 12/14.  The fluid was exudative and loculated.  Pulmonology and palliative care have been consulted.  His antibiotics have been changed to those that can cover anaerobes as he is aspirating. Despite an improvement in his leukocytosis, his chest x-ray continues to look worse.  He underwent pigtail catheter placement on 12/18 by IR and pulmonology is discussing possible DNase or lytics if effusion doesn't drain well.  Assessment & Plan:   Principal Problem:   Acute hypoxemic respiratory failure (HCC) Active Problems:   Essential hypertension   Carotid arterial disease (HCC)   CAP (community acquired pneumonia)   Cardiomyopathy, ischemic   Subdural hematoma (HCC)   Traumatic subdural hematoma with loss of consciousness (HCC)   Pulmonary hypertension (HCC)   TBI (traumatic brain injury) ([place])   Thrombocytopenia ([place])   Hypertensive heart disease with heart failure (HCC)   Chronic combined systolic and diastolic CHF (congestive heart failure) (HCC)   COPD with acute bronchitis (HCC)   CHF exacerbation (HCC)   Pressure injury of skin   Pleural effusion  1. Acute hypoxemic respiratory failure secondary to pleural effusion and aspiration pneumonia, leukocytosis appears to be improving on new antibiotics 1. Continue Unasyn day 4 of  anaerobic coverage 2. Pulmonology and IR assistance appreciated 3. Pigtail today 12/18 4. Repeat CXR in AM 2. HTN, mildly hypotensive 1. Cont very low-dose carvedilol 3. Dysphagia 1. Aspiration precautions 2. Diet advanced by speech therapy today to dysphagia 3 3. Appreciate speech therapy assistance 4. Patient declines feeding tube 4. Acute on chronic combined sys and dias chf exacerbation 1. Most recent 2d echo with EF 30-35% 2. Presenting BNP of 1348, was 496 on 01/22/17 3. Continue oral Lasix 5. COPD 1. No wheezing at this time  6. Hx subdural hematoma 1. Stable at this time  7. Prolonged QTc, paced rhythm 1. QTc of 527. Would avoid QT prolonging medications 8. Hypokalemia secondary to diuretics, resolved with oral potassium repletion 9. Generalized weakness, likely to SNF at discharge 10. Mild transaminitis, improving 1. Continue to hold pravastatin, allopurinol and amiodarone 2. Acute hepatitis panel and CK 16 3. Trend LFTs  DVT prophylaxis: Lovenox SQ Code Status: Full  Family Communication: Patient at bedside, no family at bedside Disposition Plan:  likely to SNF after decision regarding whether he needs lytics.   Consultants:   Pulmonology/critical care  Palliative care  IR  Procedures:   Thoracentesis on 12/14  Pigtail catheter placement on 12/18  Antimicrobials:  Anti-infectives (From admission, onward)   Start     Dose/Rate Route Frequency Ordered Stop   10/06/17 1000  azithromycin (ZITHROMAX) tablet 500 mg  Status:  Discontinued     500 mg Oral Daily 10/05/17 1148 10/06/17 1752   10/06/17 0130  Ampicillin-Sulbactam (UNASYN) 3 g in sodium chloride 0.9 % 100 mL IVPB     3 g 200 mL/hr over 30 Minutes Intravenous Every 6 hours 10/05/17 1838  10/05/17 1845  Ampicillin-Sulbactam (UNASYN) 3 g in sodium chloride 0.9 % 100 mL IVPB     3 g 200 mL/hr over 30 Minutes Intravenous  Once 10/05/17 1838 10/05/17 2031   10/01/17 1100  azithromycin (ZITHROMAX)  500 mg in dextrose 5 % 250 mL IVPB  Status:  Discontinued     500 mg 250 mL/hr over 60 Minutes Intravenous Every 24 hours 09/30/17 1148 10/05/17 1148   10/01/17 1000  cefTRIAXone (ROCEPHIN) 1 g in dextrose 5 % 50 mL IVPB  Status:  Discontinued     1 g 100 mL/hr over 30 Minutes Intravenous Every 24 hours 09/30/17 1148 10/05/17 1826   09/30/17 1015  cefTRIAXone (ROCEPHIN) 1 g in dextrose 5 % 50 mL IVPB     1 g 100 mL/hr over 30 Minutes Intravenous  Once 09/30/17 1006 09/30/17 1056   09/30/17 1015  azithromycin (ZITHROMAX) 500 mg in dextrose 5 % 250 mL IVPB     500 mg 250 mL/hr over 60 Minutes Intravenous  Once 09/30/17 1006 09/30/17 1212       Subjective:  States that he would like to be able to go home.  He continues to cough and his breathing is about the same as it was before.  He states his appetite is good and denies problems with his bowels or bladder.  He denies nausea.  Objective: Vitals:   10/08/17 1255 10/08/17 1300 10/08/17 1305 10/08/17 1644  BP: (!) 95/37 (!) 86/44 (!) 87/42 116/63  Pulse: (!) 58 60 60 (!) 59  Resp: 20 20 20 20   Temp:    98.7 F (37.1 C)  TempSrc:    Oral  SpO2: 95% 98% 100% 100%  Weight:      Height:        Intake/Output Summary (Last 24 hours) at 10/08/2017 1907 Last data filed at 10/08/2017 1659 Gross per 24 hour  Intake 720 ml  Output 700 ml  Net 20 ml   Filed Weights   10/06/17 0529 10/07/17 0451 10/08/17 0445  Weight: 78 kg (171 lb 15.3 oz) 77 kg (169 lb 12.1 oz) 77.9 kg (171 lb 11.8 oz)    Examination:  General exam:  Adult male.  No acute distress.  HEENT:  NCAT, MMM Respiratory system: Diminished and rhonchorous breath sounds throughout the right lung fields, rales heard anteriorly  cardiovascular system: Regular rate and rhythm, normal S1/S2. No murmurs, rubs, gallops or clicks.  Warm extremities Gastrointestinal system: Normal active bowel sounds, soft, nondistended, nontender. MSK:  Normal tone and bulk, trace bilateral  pitting lower extremity edema Neuro: Diffusely weak  Data Reviewed: I have personally reviewed following labs and imaging studies  CBC: Recent Labs  Lab 10/04/17 0318 10/05/17 0352 10/06/17 0605 10/07/17 0426 10/08/17 0423  WBC 19.6* 17.2* 14.0* 11.9* 11.6*  HGB 13.6 13.3 12.1* 12.6* 11.7*  HCT 39.1 39.0 35.2* 36.3* 34.3*  MCV 97.0 98.5 98.1 97.3 97.7  PLT 267 305 292 294 631   Basic Metabolic Panel: Recent Labs  Lab 10/02/17 0310  10/04/17 0318 10/05/17 0352 10/06/17 0605 10/07/17 0426 10/08/17 0423  NA 132*   < > 132* 134* 135 135 136  K 3.0*   < > 2.9* 3.7 3.5 3.9 3.6  CL 94*   < > 95* 95* 96* 98* 98*  CO2 29   < > 29 29 31 29 29   GLUCOSE 132*   < > 149* 115* 109* 102* 102*  BUN 35*   < > 43* 40* 31*  24* 18  CREATININE 0.96   < > 1.02 0.96 0.96 0.79 0.78  CALCIUM 8.2*   < > 8.2* 8.3* 8.1* 7.9* 8.0*  MG 1.8  --  2.0  --   --   --   --    < > = values in this interval not displayed.   GFR: Estimated Creatinine Clearance: 73.2 mL/min (by C-G formula based on SCr of 0.78 mg/dL). Liver Function Tests: Recent Labs  Lab 10/05/17 0352 10/06/17 0605 10/07/17 0426 10/08/17 0423  AST 101* 117* 102* 81*  ALT 88* 101* 94* 82*  ALKPHOS 72 65 64 62  BILITOT 0.8 0.8 0.9 0.5  PROT 5.8* 5.9* 5.5* 5.5*  ALBUMIN 2.1* 1.9* 1.9* 1.9*   No results for input(s): LIPASE, AMYLASE in the last 168 hours. No results for input(s): AMMONIA in the last 168 hours. Coagulation Profile: Recent Labs  Lab 10/08/17 1040  INR 1.15   Cardiac Enzymes: Recent Labs  Lab 10/06/17 0841  CKTOTAL 16*   BNP (last 3 results) Recent Labs    01/22/17 1231  PROBNP 496.0*   HbA1C: No results for input(s): HGBA1C in the last 72 hours. CBG: No results for input(s): GLUCAP in the last 168 hours. Lipid Profile: No results for input(s): CHOL, HDL, LDLCALC, TRIG, CHOLHDL, LDLDIRECT in the last 72 hours. Thyroid Function Tests: No results for input(s): TSH, T4TOTAL, FREET4, T3FREE, THYROIDAB  in the last 72 hours. Anemia Panel: No results for input(s): VITAMINB12, FOLATE, FERRITIN, TIBC, IRON, RETICCTPCT in the last 72 hours. Urine analysis:    Component Value Date/Time   COLORURINE YELLOW 04/14/2016 1028   APPEARANCEUR CLEAR 04/14/2016 1028   LABSPEC 1.013 04/14/2016 1028   PHURINE 6.0 04/14/2016 1028   GLUCOSEU NEGATIVE 04/14/2016 1028   HGBUR NEGATIVE 04/14/2016 1028   BILIRUBINUR NEGATIVE 04/14/2016 1028   KETONESUR NEGATIVE 04/14/2016 1028   PROTEINUR NEGATIVE 04/14/2016 1028   UROBILINOGEN 2.0 (H) 10/28/2012 0820   NITRITE NEGATIVE 04/14/2016 1028   LEUKOCYTESUR NEGATIVE 04/14/2016 1028   Sepsis Labs: @LABRCNTIP (procalcitonin:4,lacticidven:4)  ) Recent Results (from the past 240 hour(s))  Culture, blood (routine x 2) Call MD if unable to obtain prior to antibiotics being given     Status: None   Collection Time: 09/30/17  1:37 PM  Result Value Ref Range Status   Specimen Description BLOOD RIGHT FOREARM  Final   Special Requests   Final    BOTTLES DRAWN AEROBIC AND ANAEROBIC Blood Culture adequate volume   Culture   Final    NO GROWTH 5 DAYS Performed at [facility], [place] [address] [place], [place] [address]    Report Status 10/05/2017 FINAL  Final  MRSA PCR Screening     Status: None   Collection Time: 09/30/17  7:17 PM  Result Value Ref Range Status   MRSA by PCR NEGATIVE NEGATIVE Final    Comment:        The GeneXpert MRSA Assay (FDA approved for NASAL specimens only), is one component of a comprehensive MRSA colonization surveillance program. It is not intended to diagnose MRSA infection nor to guide or monitor treatment for MRSA infections.   Culture, blood (routine x 2) Call MD if unable to obtain prior to antibiotics being given     Status: None   Collection Time: 09/30/17  7:39 PM  Result Value Ref Range Status   Specimen Description BLOOD RIGHT ANTECUBITAL  Final   Special Requests IN PEDIATRIC BOTTLE Blood Culture adequate  volume  Final  Culture   Final    NO GROWTH 5 DAYS Performed at [facility], [place] [address], [place], [place] [address]    Report Status 10/05/2017 FINAL  Final  Body fluid culture     Status: None   Collection Time: 10/04/17  4:40 PM  Result Value Ref Range Status   Specimen Description PLEURAL  Final   Special Requests NONE  Final   Gram Stain   Final    ABUNDANT WBC PRESENT,BOTH PMN AND MONONUCLEAR NO ORGANISMS SEEN    Culture   Final    NO GROWTH 3 DAYS Performed at [facility], [place] [address], [place], [place] [address]    Report Status 10/08/2017 FINAL  Final      Radiology Studies: Dg Chest 2 View  Result Date: 10/07/2017 CLINICAL DATA:  Follow-up of aspiration pneumonia. EXAM: CHEST  2 VIEW COMPARISON:  Chest x-ray of October 06, 2017 FINDINGS: The left lung is adequately inflated. There is a trace of pleural fluid blunting the lateral costophrenic angle on the left. On the right there is a moderate-sized pleural effusion that has not dramatically changed since the previous study. There is no pneumothorax or mediastinal shift. There is soft tissue fullness in the right perihilar region better defined today. The heart is not enlarged. The pulmonary vascularity is normal. There is calcification in the wall of the thoracic aorta. The ICD is in stable position. There is an old fracture of the posterolateral aspect of the left seventh rib. IMPRESSION: Persistent moderate-sized right pleural effusion and trace left pleural effusion. Persistent increased density in the right perihilar and infrahilar regions partially obscured by the effusion. No pulmonary edema. Thoracic aortic atherosclerosis. Electronically Signed   By: David  Martinique M.D.   On: 10/07/2017 09:05   Dg Chest Port 1 View  Result Date: 10/08/2017 CLINICAL DATA:  Pleural effusion. EXAM: PORTABLE CHEST 1 VIEW COMPARISON:  Radiographs of October 07, 2017. FINDINGS: Stable cardiomegaly. Atherosclerosis of  thoracic aorta is noted. Left-sided pacemaker is unchanged in position. No pneumothorax is noted. Stable large right pleural effusion is noted with associated atelectasis or infiltrate. Bony thorax is unremarkable. IMPRESSION: Aortic atherosclerosis. Stable large right pleural effusion is noted with associated atelectasis or infiltrate. Electronically Signed   By: Marijo Conception, M.D.   On: 10/08/2017 08:24   Ir Image Guided Drainage By Percutaneous Catheter  Result Date: 10/08/2017 INDICATION: 83 year old with a loculated right pleural effusion. Request for chest tube placement. EXAM: IMAGE GUIDED PLACEMENT OF RIGHT CHEST TUBE MEDICATIONS: Sedation medications ANESTHESIA/SEDATION: Fentanyl 25 mcg IV; Versed 1.0 mg IV Moderate Sedation Time:  15 minutes The patient was continuously monitored during the procedure by the interventional radiology nurse under my direct supervision. FLUOROSCOPY TIME:  24 seconds, 8 mGy COMPLICATIONS: None immediate. PROCEDURE: Informed written consent was obtained from the patient after a thorough discussion of the procedural risks, benefits and alternatives. All questions were addressed. Maximal Sterile Barrier Technique was utilized including caps, mask, sterile gowns, sterile gloves, sterile drape, hand hygiene and skin antiseptic. A timeout was performed prior to the initiation of the procedure. Right side of the chest was prepped and draped in a sterile fashion. Ultrasound was used to identify the loculated right pleural effusion. Skin was anesthetized with 1% lidocaine. Yueh catheter was directed into a loculated collection with ultrasound guidance. Initially, no fluid could be aspirated. A second collection was targeted with ultrasound. A small amount of yellow fluid was aspirated. A stiff Amplatz wire  was advanced into the pleural space. Tract was dilated to accommodate a 12 [place] multipurpose drain. Approximately 20 mL of brown purulent fluid was aspirated. Catheter was  sutured to skin and attached to a Pleur-Evac. Fluoroscopic and ultrasound images were taken and saved for documentation. FINDINGS: Right pleural collection is very loculated. Approximately 20 mL of purulent fluid was removed. Findings are compatible with an empyema. IMPRESSION: Placement of right chest tube using ultrasound and fluoroscopic guidance. Purulent fluid was removed from the right pleural space. Findings are compatible with an empyema. Electronically Signed   By: Markus Daft M.D.   On: 10/08/2017 17:46     Scheduled Meds: . carvedilol  3.125 mg Oral BID WC  . fentaNYL      . furosemide  40 mg Oral Daily  . lidocaine      . mouth rinse  15 mL Mouth Rinse BID  . midazolam      . oxybutynin  5 mg Oral BID  . sertraline  50 mg Oral QHS   Continuous Infusions: . ampicillin-sulbactam (UNASYN) IV 3 g (10/08/17 1643)     LOS: 8 days    Time spent: 30 min    Janece Canterbury, MD [facility] Pager [phone]  If 7PM-7AM, please contact night-coverage [email or web address] Password [email or web address] 10/08/2017, 7:07 PM

## 2017-10-09 ENCOUNTER — Inpatient Hospital Stay (HOSPITAL_COMMUNITY): Payer: Medicare Other

## 2017-10-09 DIAGNOSIS — I509 Heart failure, unspecified: Secondary | ICD-10-CM

## 2017-10-09 LAB — CBC
HEMATOCRIT: 36.9 % — AB (ref 39.0–52.0)
HEMOGLOBIN: 12.5 g/dL — AB (ref 13.0–17.0)
MCH: 33.2 pg (ref 26.0–34.0)
MCHC: 33.9 g/dL (ref 30.0–36.0)
MCV: 98.1 fL (ref 78.0–100.0)
Platelets: 307 10*3/uL (ref 150–400)
RBC: 3.76 MIL/uL — ABNORMAL LOW (ref 4.22–5.81)
RDW: 13.1 % (ref 11.5–15.5)
WBC: 12.5 10*3/uL — ABNORMAL HIGH (ref 4.0–10.5)

## 2017-10-09 LAB — COMPREHENSIVE METABOLIC PANEL
ALK PHOS: 67 U/L (ref 38–126)
ALT: 76 U/L — AB (ref 17–63)
AST: 72 U/L — ABNORMAL HIGH (ref 15–41)
Albumin: 1.9 g/dL — ABNORMAL LOW (ref 3.5–5.0)
Anion gap: 8 (ref 5–15)
BILIRUBIN TOTAL: 0.9 mg/dL (ref 0.3–1.2)
BUN: 16 mg/dL (ref 6–20)
CHLORIDE: 95 mmol/L — AB (ref 101–111)
CO2: 31 mmol/L (ref 22–32)
Calcium: 8 mg/dL — ABNORMAL LOW (ref 8.9–10.3)
Creatinine, Ser: 0.92 mg/dL (ref 0.61–1.24)
GFR calc Af Amer: >60 mL/min (ref >60)
GFR calc non Af Amer: >60 mL/min (ref >60)
GLUCOSE: 101 mg/dL — AB (ref 65–99)
POTASSIUM: 3.7 mmol/L (ref 3.5–5.1)
SODIUM: 134 mmol/L — AB (ref 135–145)
Total Protein: 6 g/dL — ABNORMAL LOW (ref 6.5–8.1)

## 2017-10-09 MED ORDER — STERILE WATER FOR INJECTION IJ SOLN
5.0000 mg | Freq: Once | RESPIRATORY_TRACT | Status: AC
  Administered 2017-10-09: 5 mg via INTRAPLEURAL
  Filled 2017-10-09: qty 5

## 2017-10-09 MED ORDER — SODIUM CHLORIDE 0.9 % IJ SOLN
10.0000 mg | Freq: Once | INTRAMUSCULAR | Status: AC
  Administered 2017-10-09: 10 mg via INTRAPLEURAL
  Filled 2017-10-09: qty 10

## 2017-10-09 NOTE — Progress Notes (Signed)
Referring Physician(s): Byrum,R  Supervising Physician: Jacqulynn Cadet  Patient Status:  [name]  Chief Complaint:  Loculated right pleural effusion/empyema  Subjective:  Pt doing ok; occ cough, no worsening dyspnea or hemoptysis, some soreness at rt chest drain site  Allergies: Lisinopril  Medications: Prior to Admission medications   Medication Sig Start Date End Date Taking? Authorizing Provider  albuterol (PROVENTIL HFA;VENTOLIN HFA) 108 (90 Base) MCG/ACT inhaler Inhale 2 puffs into the lungs every 6 (six) hours as needed for wheezing or shortness of breath. 01/14/17  Yes Rizwan, Eunice Blase, MD  allopurinol (ZYLOPRIM) 100 MG tablet TAKE 1 TABLET ONCE DAILY. 09/16/17  Yes Noralee Space, MD  carvedilol (COREG) 3.125 MG tablet TAKE 1 TABLET TWICE DAILY WITH A MEAL. 06/10/17  Yes Minus Breeding, MD  cholecalciferol (VITAMIN D) 1000 units tablet Take 1,000 Units by mouth daily.   Yes [provider]  clopidogrel (PLAVIX) 75 MG tablet TAKE 1 TABLET ONCE DAILY. 06/25/17  Yes Noralee Space, MD  Fluticasone-Salmeterol [facility] RESPICLICK 097/35) 329-92 MCG/ACT AEPB Inhale 2 puffs into the lungs 2 (two) times daily. 09/18/17  Yes Noralee Space, MD  furosemide (LASIX) 40 MG tablet TAKE 1 TABLET DAILY AS NEEDED. (FOR WEIGHT GAIN) Patient taking differently: Take one tablet by mouth every other day. 06/25/17  Yes Noralee Space, MD  guaiFENesin (MUCINEX) 600 MG 12 hr tablet Take 600-1,200 mg by mouth 2 (two) times daily. Takes two in the morning and one in the evening.   Yes [provider]  levothyroxine (SYNTHROID, LEVOTHROID) 25 MCG tablet TAKE 1 TABLET IN THE MORNING ON AN EMPTY STOMACH. 07/08/17  Yes Noralee Space, MD  magnesium oxide (MAG-OX) 400 (241.3 Mg) MG tablet Take 0.5 tablets (200 mg total) by mouth daily. 04/27/16  Yes Love, Ivan Anchors, PA-C  Multiple Vitamin (MULTIVITAMIN) capsule Take 1 capsule by mouth daily.   Yes [provider]  Multiple  Vitamins-Minerals (PRESERVISION/LUTEIN) CAPS Take 1 capsule by mouth 2 (two) times daily.    Yes [provider]  oxybutynin (DITROPAN) 5 MG tablet TAKE 1 TABLET BY MOUTH TWICE DAILY. 09/02/17  Yes Noralee Space, MD  polyethylene glycol powder (GLYCOLAX/MIRALAX) powder DISSOLVE ONE CAPFUL IN 8 0Z. WATER TWICE DAILY. 07/23/17  Yes Noralee Space, MD  potassium chloride SA (K-DUR,KLOR-CON) 20 MEQ tablet TAKE 1 TABLET DAILY AS NEEDED. (TAKE EACH TIME YOU TAKE FUROSEMIDE) Patient taking differently: Take one tablet by mouth every other day. 04/29/17  Yes Noralee Space, MD  pravastatin (PRAVACHOL) 40 MG tablet TAKE 1 TABLET ONCE DAILY. 08/13/17  Yes Noralee Space, MD  sertraline (ZOLOFT) 50 MG tablet TAKE ONE TABLET AT BEDTIME. 09/02/17  Yes Noralee Space, MD  thiamine 100 MG tablet Take 1 tablet (100 mg total) by mouth daily. 04/26/16  Yes Love, Ivan Anchors, PA-C  ADVAIR DISKUS 250-50 MCG/DOSE AEPB Inhale 1 puff into the lungs 2 (two) times daily. 07/23/17   [provider]  alendronate (FOSAMAX) 70 MG tablet Take 1 tablet (70 mg total) by mouth once a week. Take with a full glass of water on an empty stomach. 07/10/17   Noralee Space, MD  amiodarone (PACERONE) 200 MG tablet TAKE 1 TABLET ONCE DAILY. 10/02/17   Noralee Space, MD  clotrimazole-betamethasone (LOTRISONE) cream Apply 1 application topically 2 (two) times daily. Patient not taking: Reported on 09/30/2017 06/18/17   Noralee Space, MD     Vital Signs: BP (!) 107/50 (BP  Location: Right Arm)   Pulse (!) 58   Temp 98.5 F (36.9 C) (Oral)   Resp 18   Ht 5\' 11"  (1.803 m)   Wt 173 lb 15.1 oz (78.9 kg)   SpO2 97%   BMI 24.26 kg/m   Physical Exam rt chest drain intact, output 95 cc yellow fluid ; cx pend; no air leak; tube clamped now secondary to recent TPA infusion  Imaging: Dg Chest 2 View  Result Date: 10/09/2017 CLINICAL DATA:  Up of right pleural effusion EXAM: CHEST  2 VIEW COMPARISON:  Fluoro spot radiographs of  October 08, 2017 and chest x-ray of the same day. FINDINGS: The patient has undergone placement of a pigtail catheter in the right pleural space for drainage of the pleural effusion. There is no pneumothorax. The volume of pleural fluid does not appear appreciably changed. There is a soft tissue mass-like density in the right hilar region the cardiac silhouette is enlarged. The pulmonary vascularity is normal. The ICD is in stable position. There is dense calcification in the wall of the thoracic aorta. The bony thorax exhibits no acute abnormality. IMPRESSION: Persistent moderate-sized right pleural effusion. A pigtail catheter is present now in the right pleural space. No postplacement complication. Persistent soft tissue prominence in the right perihilar region. Stable cardiomegaly without pulmonary edema. Thoracic aortic atherosclerosis. Electronically Signed   By: David  Martinique M.D.   On: 10/09/2017 09:52   Dg Chest 2 View  Result Date: 10/07/2017 CLINICAL DATA:  Follow-up of aspiration pneumonia. EXAM: CHEST  2 VIEW COMPARISON:  Chest x-ray of October 06, 2017 FINDINGS: The left lung is adequately inflated. There is a trace of pleural fluid blunting the lateral costophrenic angle on the left. On the right there is a moderate-sized pleural effusion that has not dramatically changed since the previous study. There is no pneumothorax or mediastinal shift. There is soft tissue fullness in the right perihilar region better defined today. The heart is not enlarged. The pulmonary vascularity is normal. There is calcification in the wall of the thoracic aorta. The ICD is in stable position. There is an old fracture of the posterolateral aspect of the left seventh rib. IMPRESSION: Persistent moderate-sized right pleural effusion and trace left pleural effusion. Persistent increased density in the right perihilar and infrahilar regions partially obscured by the effusion. No pulmonary edema. Thoracic aortic  atherosclerosis. Electronically Signed   By: David  Martinique M.D.   On: 10/07/2017 09:05   Dg Chest Port 1 View  Result Date: 10/08/2017 CLINICAL DATA:  Pleural effusion. EXAM: PORTABLE CHEST 1 VIEW COMPARISON:  Radiographs of October 07, 2017. FINDINGS: Stable cardiomegaly. Atherosclerosis of thoracic aorta is noted. Left-sided pacemaker is unchanged in position. No pneumothorax is noted. Stable large right pleural effusion is noted with associated atelectasis or infiltrate. Bony thorax is unremarkable. IMPRESSION: Aortic atherosclerosis. Stable large right pleural effusion is noted with associated atelectasis or infiltrate. Electronically Signed   By: Marijo Conception, M.D.   On: 10/08/2017 08:24   Dg Chest Port 1 View  Result Date: 10/06/2017 CLINICAL DATA:  83 year old male status post ultrasound-guided right thoracentesis 10/04/2017. EXAM: PORTABLE CHEST 1 VIEW COMPARISON:  10/04/2017 and earlier. FINDINGS: Portable AP semi upright view at 0432 hours. Moderate volume residual right pleural effusion appears stable since 10/04/2017. No pneumothorax or pulmonary edema. Stable cardiac size and mediastinal contours. Stable left chest cardiac AICD. Calcified aortic atherosclerosis. Minimal patchy opacity at the left lung base is increased. Negative visible bowel gas pattern in  the upper abdomen. Stable visualized osseous structures. IMPRESSION: 1. Stable moderate residual right pleural effusion since 10/04/2017. 2. Increased mild left lung base atelectasis. Electronically Signed   By: Genevie Ann M.D.   On: 10/06/2017 06:56   Ir Image Guided Drainage By Percutaneous Catheter  Result Date: 10/08/2017 INDICATION: 83 year old with a loculated right pleural effusion. Request for chest tube placement. EXAM: IMAGE GUIDED PLACEMENT OF RIGHT CHEST TUBE MEDICATIONS: Sedation medications ANESTHESIA/SEDATION: Fentanyl 25 mcg IV; Versed 1.0 mg IV Moderate Sedation Time:  15 minutes The patient was continuously monitored  during the procedure by the interventional radiology nurse under my direct supervision. FLUOROSCOPY TIME:  24 seconds, 8 mGy COMPLICATIONS: None immediate. PROCEDURE: Informed written consent was obtained from the patient after a thorough discussion of the procedural risks, benefits and alternatives. All questions were addressed. Maximal Sterile Barrier Technique was utilized including caps, mask, sterile gowns, sterile gloves, sterile drape, hand hygiene and skin antiseptic. A timeout was performed prior to the initiation of the procedure. Right side of the chest was prepped and draped in a sterile fashion. Ultrasound was used to identify the loculated right pleural effusion. Skin was anesthetized with 1% lidocaine. Yueh catheter was directed into a loculated collection with ultrasound guidance. Initially, no fluid could be aspirated. A second collection was targeted with ultrasound. A small amount of yellow fluid was aspirated. A stiff Amplatz wire was advanced into the pleural space. Tract was dilated to accommodate a 12 [place] multipurpose drain. Approximately 20 mL of brown purulent fluid was aspirated. Catheter was sutured to skin and attached to a Pleur-Evac. Fluoroscopic and ultrasound images were taken and saved for documentation. FINDINGS: Right pleural collection is very loculated. Approximately 20 mL of purulent fluid was removed. Findings are compatible with an empyema. IMPRESSION: Placement of right chest tube using ultrasound and fluoroscopic guidance. Purulent fluid was removed from the right pleural space. Findings are compatible with an empyema. Electronically Signed   By: Markus Daft M.D.   On: 10/08/2017 17:46    Labs:  CBC: Recent Labs    10/06/17 0605 10/07/17 0426 10/08/17 0423 10/09/17 0419  WBC 14.0* 11.9* 11.6* 12.5*  HGB 12.1* 12.6* 11.7* 12.5*  HCT 35.2* 36.3* 34.3* 36.9*  PLT 292 294 296 307    COAGS: Recent Labs    10/08/17 1040  INR 1.15    BMP: Recent Labs     10/06/17 0605 10/07/17 0426 10/08/17 0423 10/09/17 0419  NA 135 135 136 134*  K 3.5 3.9 3.6 3.7  CL 96* 98* 98* 95*  CO2 31 29 29 31   GLUCOSE 109* 102* 102* 101*  BUN 31* 24* 18 16  CALCIUM 8.1* 7.9* 8.0* 8.0*  CREATININE 0.96 0.79 0.78 0.92  GFRNONAA >60 >60 >60 >60  GFRAA >60 >60 >60 >60    LIVER FUNCTION TESTS: Recent Labs    10/06/17 0605 10/07/17 0426 10/08/17 0423 10/09/17 0419  BILITOT 0.8 0.9 0.5 0.9  AST 117* 102* 81* 72*  ALT 101* 94* 82* 76*  ALKPHOS 65 64 62 67  PROT 5.9* 5.5* 5.5* 6.0*  ALBUMIN 1.9* 1.9* 1.9* 1.9*    Assessment and Plan: Pt s/p drainage of loculated rt pleural effusion/empyema; afebrile; fluid cx pend; WBC 12.5(11.6), hgb 12.5, creat nl; CXR today with no ptx, persistent rt effusion; CCM administered TPA into tube today per nursing; cont current tx for now; monitor tube output closely; consider f/u CT chest before removing drain   Electronically Signed: D. Rowe Robert, PA-C 10/09/2017, 3:47  PM   I spent a total of 15 minutes at the the patient's bedside AND on the patient's hospital floor or unit, greater than 50% of which was counseling/coordinating care for right chest drain    Patient ID: [name], male   DOB: 11-Mar-1931, 83 y.o.   MRN: [id number]

## 2017-10-09 NOTE — Progress Notes (Signed)
SLP Cancellation Note  Patient Details Name: [name] MRN: [id number] DOB: 18-Jun-1931   Cancelled treatment:       Reason Eval/Treat Not Completed: Patient at procedure or test/unavailable   [name] 10/09/2017, 2:12 PM

## 2017-10-09 NOTE — Progress Notes (Signed)
Daily Progress Note   Patient Name: [name]       Date: 10/09/2017 DOB: [birth year]  Age: 83 y.o. MRN#: [id number] Attending Physician: Georgette Shell, MD Primary Care Physician: Noralee Space, MD Admit Date: 09/30/2017  Reason for Consultation/Follow-up: Establishing goals of care  Subjective: Met with with [name] this afternoon.  No other family present at time of encounter.  He is now s/p chest tube placement with some drainage.  Currently in process of tpa/pulmazyme to see if it will further break up loculations.  Length of Stay: 9  Current Medications: Scheduled Meds:  . carvedilol  3.125 mg Oral BID WC  . furosemide  40 mg Oral Daily  . mouth rinse  15 mL Mouth Rinse BID  . oxybutynin  5 mg Oral BID  . sertraline  50 mg Oral QHS    Continuous Infusions: . ampicillin-sulbactam (UNASYN) IV Stopped (10/09/17 0843)    PRN Meds: albuterol, guaiFENesin-dextromethorphan, RESOURCE THICKENUP CLEAR, traMADol  Physical Exam       General: Alert, awake, in no acute distress.  HEENT: No bruits, no goiter, no JVD Heart: Regular rate and rhythm. No murmur appreciated. Lungs: Throughout.  Diminished, particularly the right base Abdomen: Soft, nontender, nondistended, positive bowel sounds.  Ext: Trace edema Skin: Warm and dry Neuro: Grossly intact, nonfocal.  Vital Signs: BP (!) 107/50 (BP Location: Right Arm)   Pulse (!) 58   Temp 98.5 F (36.9 C) (Oral)   Resp 18   Ht 5' 11"  (1.803 m)   Wt 78.9 kg (173 lb 15.1 oz)   SpO2 97%   BMI 24.26 kg/m  SpO2: SpO2: 97 % O2 Device: O2 Device: Nasal Cannula O2 Flow Rate: O2 Flow Rate (L/min): 3 L/min  Intake/output summary:   Intake/Output Summary (Last 24 hours) at 10/09/2017 1345 Last data filed at 10/09/2017  0818 Gross per 24 hour  Intake 620 ml  Output 595 ml  Net 25 ml   LBM: Last BM Date: 10/08/17 Baseline Weight: Weight: 77.1 kg (170 lb) Most recent weight: Weight: 78.9 kg (173 lb 15.1 oz)       Palliative Assessment/Data:    Flowsheet Rows     Most Recent Value  Intake Tab  Referral Department  Hospitalist  Unit at Time of Referral  Med/Surg Unit  Palliative Care Primary Diagnosis  Neurology  Date Notified  10/05/17  Palliative Care Type  New Palliative care  Reason for referral  Clarify Goals of Care  Date of Admission  09/30/17  Date first seen by Palliative Care  10/06/17  # of days Palliative referral response time  1 Day(s)  # of days IP prior to Palliative referral  5  Clinical Assessment  Psychosocial & Spiritual Assessment  Palliative Care Outcomes      Patient Active Problem List   Diagnosis Date Noted  . Pleural effusion   . Pressure injury of skin 10/01/2017  . Acute hypoxemic respiratory failure ([place]) 09/30/2017  . CHF exacerbation ([place]) 09/30/2017  . Shingles outbreak 05/27/2017  . Seborrheic dermatitis 05/27/2017  . Hypotension due to drugs 04/01/2017  . Acute on chronic respiratory failure with hypoxia ([place]) 01/13/2017  . COPD with acute bronchitis ([place]) 01/13/2017  . CAD S/P percutaneous coronary angioplasty   . Chronic combined systolic and diastolic CHF (congestive heart failure) ([place])   . Blunt chest trauma 05/04/2016  . 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 SIADH (syndrome of inappropriate ADH production) ([place])   . Hypertensive heart disease with heart failure ([place])   . Orthostatic hypotension 04/12/2016  . TBI (traumatic brain injury) ([place])   . Acute on chronic combined systolic and diastolic congestive heart failure ([place])   . S/P ORIF (open reduction internal fixation) fracture   . Thrombocytopenia ([place])   . Urinary retention   . Slow transit constipation   . Thyroid activity decreased   . IVCD (intraventricular  conduction defect) 04/05/2016  . Traumatic subdural hematoma with loss of consciousness ([place])   . Pulmonary hypertension ([place])   . Cardiomyopathy, ischemic   . Subdural hematoma ([place])   . Ventricular tachycardia ([place]) 04/01/2016  . Cardiac arrest ([place]) 03/29/2016  . 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 CAP (community acquired pneumonia) 04/28/2015  . T wave over sensing resulting in inappropriate shocks 08/14/2013  . Vitamin D deficiency disease 04/09/2012  . Actinic skin damage 05/22/2011  . Alcohol use 03/22/2011  . Other dysphagia 02/16/2011  . Carotid arterial disease ([place]) 11/17/2009  . GOUT 09/23/2009  . SYNCOPE 04/19/2009  . Closed fracture of bone 07/27/2008  . Automatic implantable cardioverter-defibrillator in situ 04/13/2008  . COLONIC POLYPS 12/09/2007  . DIVERTICULOSIS OF COLON 12/09/2007  . Essential hypertension 12/08/2007  . Coronary atherosclerosis 12/08/2007  . Peripheral vascular disease ([place]) 12/08/2007  . COPD GOLD III 12/08/2007  . 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  Palliative have consulted for goals of care.  Assessment: Patient Active Problem List   Diagnosis Date Noted  . Pleural effusion   . Pressure injury of skin 10/01/2017  . Acute hypoxemic respiratory failure ([place]) 09/30/2017  . CHF exacerbation ([place]) 09/30/2017  . Shingles outbreak 05/27/2017  . Seborrheic dermatitis 05/27/2017  . Hypotension due to drugs 04/01/2017  . Acute on chronic respiratory failure with hypoxia ([place]) 01/13/2017  . COPD with acute bronchitis  ([place]) 01/13/2017  . CAD S/P percutaneous coronary angioplasty   . Chronic combined systolic and diastolic CHF (congestive heart failure) ([place])   .  Blunt chest trauma 05/04/2016  . 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 SIADH (syndrome of inappropriate ADH production) ([place])   . Hypertensive heart disease with heart failure ([place])   . Orthostatic hypotension 04/12/2016  . TBI (traumatic brain injury) ([place])   . Acute on chronic combined systolic and diastolic congestive heart failure ([place])   . S/P ORIF (open reduction internal fixation) fracture   . Thrombocytopenia ([place])   . Urinary retention   . Slow transit constipation   . Thyroid activity decreased   . IVCD (intraventricular conduction defect) 04/05/2016  . Traumatic subdural hematoma with loss of consciousness ([place])   . Pulmonary hypertension ([place])   . Cardiomyopathy, ischemic   . Subdural hematoma ([place])   . Ventricular tachycardia ([employer]) 04/01/2016  . Cardiac arrest ([place]) 03/29/2016  . 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 CAP (community acquired pneumonia) 04/28/2015  . T wave over sensing resulting in inappropriate shocks 08/14/2013  . Vitamin D deficiency disease 04/09/2012  . Actinic skin damage 05/22/2011  . Alcohol use 03/22/2011  . Other dysphagia 02/16/2011  . Carotid arterial disease ([place]) 11/17/2009  . GOUT 09/23/2009  . SYNCOPE 04/19/2009  . Closed fracture of bone 07/27/2008  . Automatic implantable cardioverter-defibrillator in situ 04/13/2008  . COLONIC POLYPS 12/09/2007  . DIVERTICULOSIS OF COLON 12/09/2007  . Essential hypertension 12/08/2007  . Coronary atherosclerosis 12/08/2007  . Peripheral vascular disease ([place]) 12/08/2007  . COPD GOLD III 12/08/2007  . Osteoarthritis 12/08/2007   Recommendations/Plan:  S/p tube placement and now getting tpa/pumlozyme.  Daughter to bring in a copy of his HCPOA paperwork for our records and to clarify if the paperwork he completed does  match with his wishes for decision making in the event that he is not able to make decisions for himself.  Focus today is on getting pulmozyme therapy.  Plan to follow-up tomorrow with hope he will emotionally be in a place to discuss long term goals further at that time  Goals of Care and Additional Recommendations:  Limitations on Scope of Treatment: Full Scope Treatment  Code Status:    Code Status Orders  (From admission, onward)        Start     Ordered   09/30/17 1148  Full code  Continuous     09/30/17 1148    Code Status History    Date Active Date Inactive Code Status Order ID Comments User Context   01/12/2017 20:26 01/14/2017 23:22 Full Code [id number]  Caren Griffins, MD Inpatient   04/09/2016 16:36 04/27/2016 15:45 Full Code [id number]  Bary Leriche, PA-C Inpatient   03/29/2016 09:38 04/09/2016 16:36 Full Code [id number]  Awilda Bill, NP ED   12/13/2015 13:32 12/14/2015 13:04 Full Code [id number]  Melissa Montane, MD Inpatient    Advance Directive Documentation     Most Recent Value  Type of Advance Directive  Healthcare Power of Attorney  Pre-existing out of facility DNR order (yellow form or pink MOST form)  No data  "MOST" Form in Place?  No data       Prognosis:   Unable to determine  Discharge Planning:  To Be Determined  Care plan was discussed with patient, bedside RN  Thank you for allowing the Palliative Medicine Team to assist in the care of this patient.   Total Time 20 Prolonged Time Billed No      Greater than 50%  of this time was spent counseling and coordinating care  related to the above assessment and plan.  Micheline Rough, MD  Please contact Palliative Medicine Team phone at [phone] for questions and concerns.

## 2017-10-09 NOTE — Progress Notes (Signed)
   10/09/17 1400  Clinical Encounter Type  Visited With Patient;Family  Visit Type Follow-up   Followed up from visit on Monday.  Prior to going his 2 daughters were here.  I introduced myself to one of them and said I had visited him and offered to listen if he wanted to talk.  She said, he does not ever say much and would be surprised if he opened up.  Patient was polite, but definitely did not want to talk about his health.  Will follow as needed. Chaplain Katherene Ponto

## 2017-10-09 NOTE — Progress Notes (Signed)
PROGRESS NOTE    [name]  ZOX:[id number] DOB: 05-Mar-1931 DOA: 09/30/2017 PCP: Noralee Space, MD  Brief Narrative:83 y.o.malewith medical history significant ofchronic combined diastolic and systolic congestive heart failure, CAD, hypertension, hyperlipidemia, vascular disease who presentedto the emergency department with complaints of increased cough and congestion started 2 weeks prior to hospital admission.  Chest x-ray demonstrated a right lower lobe pneumonia and right pleural effusion.  He did not improve with antibiotics and diuresis.  CT chest demonstrated a moderate right-sided pleural effusion without loculation.  He underwent thoracentesis on 12/14.  The fluid was exudative and loculated.  Pulmonology and palliative care have been consulted.  His antibiotics have been changed to those that can cover anaerobes as he is aspirating. Despite an improvement in his leukocytosis, his chest x-ray continues to look worse.  He underwent pigtail catheter placement on 12/18 by IR and pulmonology is discussing possible DNase or lytics if effusion doesn't drain well.  12/19- patient denies any new complaints.he says he hasnt gotten out of bed yet...   Assessment & Plan:   Principal Problem:   Acute hypoxemic respiratory failure (HCC) Active Problems:   Essential hypertension   Carotid arterial disease (HCC)   CAP (community acquired pneumonia)   Cardiomyopathy, ischemic   Subdural hematoma (HCC)   Traumatic subdural hematoma with loss of consciousness (HCC)   Pulmonary hypertension (HCC)   TBI (traumatic brain injury) ([place])   Thrombocytopenia ([place])   Hypertensive heart disease with heart failure (HCC)   Chronic combined systolic and diastolic CHF (congestive heart failure) (HCC)   COPD with acute bronchitis (HCC)   CHF exacerbation (HCC)   Pressure injury of skin   Pleural effusion  1. Acute hypoxemic respiratory failure secondary to pleural effusion and aspiration pneumonia,  leukocytosis appears to be improving on new antibiotics 1. Continue Unasyn day 4 of anaerobic coverage 2. Pulmonology and IR assistance appreciated 3. Pigtail today 12/18 4. Repeat CXR in AM 2. HTN, mildly hypotensive 1. Cont very low-dose carvedilol 3. Dysphagia 1. Aspiration precautions 2. Diet advanced by speech therapy today to dysphagia 3 3. Appreciate speech therapy assistance 4. Patient declines feeding tube 4. Acute on chronic combined sys and dias chf exacerbation 1. Most recent 2d echo with EF 30-35% 2. Presenting BNP of 1348, was 496 on 01/22/17 3. Continue oral Lasix 5. COPD 1. No wheezing at this time 6. Hx subdural hematoma 1. Stable at this time 7. Prolonged QTc, paced rhythm 1. QTc of 527. Would avoid QT prolonging medications 8. Hypokalemia secondary to diuretics, resolved with oral potassium repletion 9. Generalized weakness, likely to SNF at discharge 10. Mild transaminitis, improving 1. Continue to hold pravastatin, allopurinol and amiodarone 2. Acute hepatitis panel and CK 16 3. Trend LFTs     DVT prophylaxis: lovenox Code Status: full Family Communication:none Disposition Plan tbd :Consultants: pccm  Procedures: drainage of loculated pleural effusion.s/p tpa and pulmozyme 12/19. Antimicrobials:unasyn  Subjective:no complaints  Objective:resting in bed in nad. Vitals:   10/08/17 2033 10/09/17 0457 10/09/17 0818 10/09/17 1324  BP: (!) 120/55 (!) 117/50  (!) 107/50  Pulse: 61 (!) 59 65 (!) 58  Resp: 20 20  18   Temp: 98.4 F (36.9 C) 98.6 F (37 C)  98.5 F (36.9 C)  TempSrc: Oral Oral  Oral  SpO2: 99% 99%  97%  Weight:  78.9 kg (173 lb 15.1 oz)    Height:        Intake/Output Summary (Last 24 hours) at 10/09/2017 1709 Last  data filed at 10/09/2017 1612 Gross per 24 hour  Intake 720 ml  Output 1145 ml  Net -425 ml   Filed Weights   10/07/17 0451 10/08/17 0445 10/09/17 0457  Weight: 77 kg (169 lb 12.1 oz) 77.9 kg (171 lb 11.8 oz)  78.9 kg (173 lb 15.1 oz)    Examination:  General exam: Appears calm and comfortable  Respiratory system: rhonchi  auscultation. Respiratory effort normal.decreased breath sounds bases. Cardiovascular system: S1 & S2 heard, RRR. No JVD, murmurs, rubs, gallops or clicks. No pedal edema. Gastrointestinal system: Abdomen is nondistended, soft and nontender. No organomegaly or masses felt. Normal bowel sounds heard. Central nervous system: Alert and oriented. No focal neurological deficits. Extremities: Symmetric 5 x 5 power. Skin: No rashes, lesions or ulcers Psychiatry: Judgement and insight appear normal. Mood & affect appropriate.     Data Reviewed: I have personally reviewed following labs and imaging studies  CBC: Recent Labs  Lab 10/05/17 0352 10/06/17 0605 10/07/17 0426 10/08/17 0423 10/09/17 0419  WBC 17.2* 14.0* 11.9* 11.6* 12.5*  HGB 13.3 12.1* 12.6* 11.7* 12.5*  HCT 39.0 35.2* 36.3* 34.3* 36.9*  MCV 98.5 98.1 97.3 97.7 98.1  PLT 305 292 294 296 209   Basic Metabolic Panel: Recent Labs  Lab 10/04/17 0318 10/05/17 0352 10/06/17 0605 10/07/17 0426 10/08/17 0423 10/09/17 0419  NA 132* 134* 135 135 136 134*  K 2.9* 3.7 3.5 3.9 3.6 3.7  CL 95* 95* 96* 98* 98* 95*  CO2 29 29 31 29 29 31   GLUCOSE 149* 115* 109* 102* 102* 101*  BUN 43* 40* 31* 24* 18 16  CREATININE 1.02 0.96 0.96 0.79 0.78 0.92  CALCIUM 8.2* 8.3* 8.1* 7.9* 8.0* 8.0*  MG 2.0  --   --   --   --   --    GFR: Estimated Creatinine Clearance: 63.7 mL/min (by C-G formula based on SCr of 0.92 mg/dL). Liver Function Tests: Recent Labs  Lab 10/05/17 0352 10/06/17 0605 10/07/17 0426 10/08/17 0423 10/09/17 0419  AST 101* 117* 102* 81* 72*  ALT 88* 101* 94* 82* 76*  ALKPHOS 72 65 64 62 67  BILITOT 0.8 0.8 0.9 0.5 0.9  PROT 5.8* 5.9* 5.5* 5.5* 6.0*  ALBUMIN 2.1* 1.9* 1.9* 1.9* 1.9*   No results for input(s): LIPASE, AMYLASE in the last 168 hours. No results for input(s): AMMONIA in the last 168  hours. Coagulation Profile: Recent Labs  Lab 10/08/17 1040  INR 1.15   Cardiac Enzymes: Recent Labs  Lab 10/06/17 0841  CKTOTAL 16*   BNP (last 3 results) Recent Labs    01/22/17 1231  PROBNP 496.0*   HbA1C: No results for input(s): HGBA1C in the last 72 hours. CBG: No results for input(s): GLUCAP in the last 168 hours. Lipid Profile: No results for input(s): CHOL, HDL, LDLCALC, TRIG, CHOLHDL, LDLDIRECT in the last 72 hours. Thyroid Function Tests: No results for input(s): TSH, T4TOTAL, FREET4, T3FREE, THYROIDAB in the last 72 hours. Anemia Panel: No results for input(s): VITAMINB12, FOLATE, FERRITIN, TIBC, IRON, RETICCTPCT in the last 72 hours. Sepsis Labs: Recent Labs  Lab 10/03/17 0256 10/04/17 0318  PROCALCITON 1.14 0.65    Recent Results (from the past 240 hour(s))  Culture, blood (routine x 2) Call MD if unable to obtain prior to antibiotics being given     Status: None   Collection Time: 09/30/17  1:37 PM  Result Value Ref Range Status   Specimen Description BLOOD RIGHT FOREARM  Final   Special  Requests   Final    BOTTLES DRAWN AEROBIC AND ANAEROBIC Blood Culture adequate volume   Culture   Final    NO GROWTH 5 DAYS Performed at [facility], [place] [address], [place], [place] [address]    Report Status 10/05/2017 FINAL  Final  MRSA PCR Screening     Status: None   Collection Time: 09/30/17  7:17 PM  Result Value Ref Range Status   MRSA by PCR NEGATIVE NEGATIVE Final    Comment:        The GeneXpert MRSA Assay (FDA approved for NASAL specimens only), is one component of a comprehensive MRSA colonization surveillance program. It is not intended to diagnose MRSA infection nor to guide or monitor treatment for MRSA infections.   Culture, blood (routine x 2) Call MD if unable to obtain prior to antibiotics being given     Status: None   Collection Time: 09/30/17  7:39 PM  Result Value Ref Range Status   Specimen Description BLOOD RIGHT  ANTECUBITAL  Final   Special Requests IN PEDIATRIC BOTTLE Blood Culture adequate volume  Final   Culture   Final    NO GROWTH 5 DAYS Performed at [facility], [place] [address], [place], [place] [address]    Report Status 10/05/2017 FINAL  Final  Body fluid culture     Status: None   Collection Time: 10/04/17  4:40 PM  Result Value Ref Range Status   Specimen Description PLEURAL  Final   Special Requests NONE  Final   Gram Stain   Final    ABUNDANT WBC PRESENT,BOTH PMN AND MONONUCLEAR NO ORGANISMS SEEN    Culture   Final    NO GROWTH 3 DAYS Performed at [facility], [place] [address] [place], [place] [address]    Report Status 10/08/2017 FINAL  Final  Body fluid culture     Status: None (Preliminary result)   Collection Time: 10/08/17  1:03 PM  Result Value Ref Range Status   Specimen Description PLEURAL RIGHT  Final   Special Requests NONE  Final   Gram Stain   Final    RARE WBC PRESENT, PREDOMINANTLY PMN FEW GRAM POSITIVE COCCI IN PAIRS IN CHAINS RARE GRAM VARIABLE ROD    Culture   Final    NO GROWTH < 24 HOURS Performed at [facility], [place] [address], [place], [place] [address]    Report Status PENDING  Incomplete         Radiology Studies: Dg Chest 2 View  Result Date: 10/09/2017 CLINICAL DATA:  Up of right pleural effusion EXAM: CHEST  2 VIEW COMPARISON:  Fluoro spot radiographs of October 08, 2017 and chest x-ray of the same day. FINDINGS: The patient has undergone placement of a pigtail catheter in the right pleural space for drainage of the pleural effusion. There is no pneumothorax. The volume of pleural fluid does not appear appreciably changed. There is a soft tissue mass-like density in the right hilar region the cardiac silhouette is enlarged. The pulmonary vascularity is normal. The ICD is in stable position. There is dense calcification in the wall of the thoracic aorta. The bony thorax exhibits no acute abnormality. IMPRESSION: Persistent  moderate-sized right pleural effusion. A pigtail catheter is present now in the right pleural space. No postplacement complication. Persistent soft tissue prominence in the right perihilar region. Stable cardiomegaly without pulmonary edema. Thoracic aortic atherosclerosis. Electronically Signed   By: David  Martinique M.D.  On: 10/09/2017 09:52   Dg Chest Port 1 View  Result Date: 10/08/2017 CLINICAL DATA:  Pleural effusion. EXAM: PORTABLE CHEST 1 VIEW COMPARISON:  Radiographs of October 07, 2017. FINDINGS: Stable cardiomegaly. Atherosclerosis of thoracic aorta is noted. Left-sided pacemaker is unchanged in position. No pneumothorax is noted. Stable large right pleural effusion is noted with associated atelectasis or infiltrate. Bony thorax is unremarkable. IMPRESSION: Aortic atherosclerosis. Stable large right pleural effusion is noted with associated atelectasis or infiltrate. Electronically Signed   By: Marijo Conception, M.D.   On: 10/08/2017 08:24   Ir Image Guided Drainage By Percutaneous Catheter  Result Date: 10/08/2017 INDICATION: 83 year old with a loculated right pleural effusion. Request for chest tube placement. EXAM: IMAGE GUIDED PLACEMENT OF RIGHT CHEST TUBE MEDICATIONS: Sedation medications ANESTHESIA/SEDATION: Fentanyl 25 mcg IV; Versed 1.0 mg IV Moderate Sedation Time:  15 minutes The patient was continuously monitored during the procedure by the interventional radiology nurse under my direct supervision. FLUOROSCOPY TIME:  24 seconds, 8 mGy COMPLICATIONS: None immediate. PROCEDURE: Informed written consent was obtained from the patient after a thorough discussion of the procedural risks, benefits and alternatives. All questions were addressed. Maximal Sterile Barrier Technique was utilized including caps, mask, sterile gowns, sterile gloves, sterile drape, hand hygiene and skin antiseptic. A timeout was performed prior to the initiation of the procedure. Right side of the chest was prepped  and draped in a sterile fashion. Ultrasound was used to identify the loculated right pleural effusion. Skin was anesthetized with 1% lidocaine. Yueh catheter was directed into a loculated collection with ultrasound guidance. Initially, no fluid could be aspirated. A second collection was targeted with ultrasound. A small amount of yellow fluid was aspirated. A stiff Amplatz wire was advanced into the pleural space. Tract was dilated to accommodate a 12 [place] multipurpose drain. Approximately 20 mL of brown purulent fluid was aspirated. Catheter was sutured to skin and attached to a Pleur-Evac. Fluoroscopic and ultrasound images were taken and saved for documentation. FINDINGS: Right pleural collection is very loculated. Approximately 20 mL of purulent fluid was removed. Findings are compatible with an empyema. IMPRESSION: Placement of right chest tube using ultrasound and fluoroscopic guidance. Purulent fluid was removed from the right pleural space. Findings are compatible with an empyema. Electronically Signed   By: Markus Daft M.D.   On: 10/08/2017 17:46        Scheduled Meds: . carvedilol  3.125 mg Oral BID WC  . furosemide  40 mg Oral Daily  . mouth rinse  15 mL Mouth Rinse BID  . oxybutynin  5 mg Oral BID  . sertraline  50 mg Oral QHS   Continuous Infusions: . ampicillin-sulbactam (UNASYN) IV Stopped (10/09/17 1436)     LOS: 9 days      Georgette Shell, MD [facility]  If 7PM-7AM, please contact night-coverage [email or web address] Password [email or web address] 10/09/2017, 5:09 PM

## 2017-10-09 NOTE — Progress Notes (Signed)
Pt received tPA through his chest tube per NP, NP told RN that bleeding was possible with this administration. RN on dayshift with RN taking over during nightshift noted the start of minimal blood in chest tube. RN on nightshift will continue to monitor patient for more significant bleeding or bleeding around the chest tube insertion site.  Carmela Hurt, RN

## 2017-10-09 NOTE — Progress Notes (Signed)
PT Cancellation Note  Patient Details Name: [name] MRN: [id number] DOB: 05/31/1931   Cancelled Treatment:    Reason Eval/Treat Not Completed: Patient at procedure or test/unavailable(pt is having a procedure done with chest tube. Will follow. )   Philomena Doheny 10/09/2017, 1:31 PM [phone]

## 2017-10-09 NOTE — Progress Notes (Signed)
Assessed patients CXR early am > noted persistent moderate right sided pleural effusion.  Proceeded per protocol dosing with a one time dost of tPA and pulmozyme.  Patient turned Q15 minutes during 1 hour each dwell time.  Post un-clamping of chest tube, approximately 32ml total of dark yellow fluid returned.  Patient reports cough is improved after procedure.  Will follow up CXR in am.     Noe Gens, NP-C [facility] Pgr: [phone] or if no answer [phone] 10/09/2017, 4:01 PM

## 2017-10-10 ENCOUNTER — Ambulatory Visit (INDEPENDENT_AMBULATORY_CARE_PROVIDER_SITE_OTHER): Payer: Medicare Other | Admitting: Specialist

## 2017-10-10 ENCOUNTER — Inpatient Hospital Stay (HOSPITAL_COMMUNITY): Payer: Medicare Other

## 2017-10-10 LAB — CBC
HEMATOCRIT: 36.5 % — AB (ref 39.0–52.0)
HEMOGLOBIN: 12.4 g/dL — AB (ref 13.0–17.0)
MCH: 33.4 pg (ref 26.0–34.0)
MCHC: 34 g/dL (ref 30.0–36.0)
MCV: 98.4 fL (ref 78.0–100.0)
Platelets: 271 10*3/uL (ref 150–400)
RBC: 3.71 MIL/uL — ABNORMAL LOW (ref 4.22–5.81)
RDW: 13.1 % (ref 11.5–15.5)
WBC: 12.2 10*3/uL — ABNORMAL HIGH (ref 4.0–10.5)

## 2017-10-10 LAB — COMPREHENSIVE METABOLIC PANEL
ALBUMIN: 1.8 g/dL — AB (ref 3.5–5.0)
ALK PHOS: 63 U/L (ref 38–126)
ALT: 67 U/L — ABNORMAL HIGH (ref 17–63)
ANION GAP: 7 (ref 5–15)
AST: 66 U/L — AB (ref 15–41)
BILIRUBIN TOTAL: 0.7 mg/dL (ref 0.3–1.2)
BUN: 15 mg/dL (ref 6–20)
CALCIUM: 8.1 mg/dL — AB (ref 8.9–10.3)
CO2: 32 mmol/L (ref 22–32)
Chloride: 96 mmol/L — ABNORMAL LOW (ref 101–111)
Creatinine, Ser: 1.03 mg/dL (ref 0.61–1.24)
GFR calc Af Amer: >60 mL/min (ref >60)
GFR calc non Af Amer: >60 mL/min (ref >60)
GLUCOSE: 133 mg/dL — AB (ref 65–99)
Potassium: 3.6 mmol/L (ref 3.5–5.1)
Sodium: 135 mmol/L (ref 135–145)
TOTAL PROTEIN: 5.8 g/dL — AB (ref 6.5–8.1)

## 2017-10-10 MED ORDER — STERILE WATER FOR INJECTION IJ SOLN
5.0000 mg | Freq: Once | RESPIRATORY_TRACT | Status: AC
  Administered 2017-10-10: 5 mg via INTRAPLEURAL
  Filled 2017-10-10: qty 5

## 2017-10-10 MED ORDER — IBUPROFEN 200 MG PO TABS
600.0000 mg | ORAL_TABLET | Freq: Once | ORAL | Status: AC
  Administered 2017-10-10: 600 mg via ORAL
  Filled 2017-10-10: qty 3

## 2017-10-10 MED ORDER — SODIUM CHLORIDE 0.9 % IJ SOLN
10.0000 mg | Freq: Once | INTRAMUSCULAR | Status: AC
  Administered 2017-10-10: 10 mg via INTRAPLEURAL
  Filled 2017-10-10: qty 10

## 2017-10-10 NOTE — Progress Notes (Signed)
Name: [name] MRN: [id number] DOB: Jun 01, 1931    ADMISSION DATE:  09/30/2017 CONSULTATION DATE:  10/05/2017  REFERRING MD :  Dr. Sheran Fava  CHIEF COMPLAINT:  Pleural effusion  HISTORY OF PRESENT ILLNESS:   83 yo male presented with 2 weeks of dyspnea.  Treated for pneumonia.  Found to have Rt effusion.  PMHx of COPD, combined CHF, CAD, HTN, HLD.  SIGNIFICANT EVENTS  12/10 admit 12/13 CT chest with effusion  12/14 Thoracentesis 12/15 PCCM consult 12/18 IR pigtail catheter placement  12/19 S/p intrapleural altepase and pulmozyme  STUDIES:  Echo 12/10 >> mild LVH, EF 30 to 35%, grade 2 DD, mild AS, PAS 43 mmHg  CT chest 12/13 >> Moderate R-sided pleural effusion without loculation. Bronchus intermedius compression or obstruction. Resultant right middle lobe and right lower lobe volume loss. Probable mucoid impaction at the left apex. Emphysema.  Rt thoracentesis 12/14 >> glucose 85, protein 3.7, LDH 984, WBC 1175 (82%N)  CULTURES: Blood 12/10 >> negative (final) Pleural fluid 12/10 >> NG x 3 days (final)  ANTIBIOTICS: Rocephin 12/10 >> 12/16 Zithromax 12/10 >> 12/15 Unasyn 12/15 >>   SUBJECTIVE:  Laying in bed, appears comfortable. Per daughter has not yet been out of bed. Complaining of cough today. Breathing comfortably on 3L [place]. Daughter at bedside with multiple questions, inquiring about the possibility of CIR on discharge and VATS if no improvement with chest tube.   VITAL SIGNS: BP 118/68 (BP Location: Left Arm)   Pulse 60   Temp 99 F (37.2 C) (Oral)   Resp 16   Ht 5\' 11"  (1.803 m)   Wt 166 lb 0.1 oz (75.3 kg)   SpO2 94%   BMI 23.15 kg/m   INTAKE/OUTPUT: I/O last 3 completed shifts: In: 1140 [P.O.:440; IV Piggyback:700] Out: 7939 [Urine:1300; Chest Tube:525]  PHYSICAL EXAMINATION: Gen: Pleasant, well-nourished, in no distress,  normal affect  ENT: No lesions,  mouth clear,  oropharynx clear, no postnasal drip  Neck: No JVD, no TMG, no carotid  bruits  Lungs: No use of accessory muscles, bibasilar crackles, decreased breath sounds on R  Cardiovascular: RRR, heart sounds normal, 3/6 holosystolic murmur  Musculoskeletal: No deformities, no cyanosis or clubbing  Neuro: alert, non focal  Skin: Warm, no lesions or rashes    LABS: CMP Latest Ref Rng & Units 10/10/2017 10/09/2017 10/08/2017  Glucose 65 - 99 mg/dL 133(H) 101(H) 102(H)  BUN 6 - 20 mg/dL 15 16 18   Creatinine 0.61 - 1.24 mg/dL 1.03 0.92 0.78  Sodium 135 - 145 mmol/L 135 134(L) 136  Potassium 3.5 - 5.1 mmol/L 3.6 3.7 3.6  Chloride 101 - 111 mmol/L 96(L) 95(L) 98(L)  CO2 22 - 32 mmol/L 32 31 29  Calcium 8.9 - 10.3 mg/dL 8.1(L) 8.0(L) 8.0(L)  Total Protein 6.5 - 8.1 g/dL 5.8(L) 6.0(L) 5.5(L)  Total Bilirubin 0.3 - 1.2 mg/dL 0.7 0.9 0.5  Alkaline Phos 38 - 126 U/L 63 67 62  AST 15 - 41 U/L 66(H) 72(H) 81(H)  ALT 17 - 63 U/L 67(H) 76(H) 82(H)   CBC Latest Ref Rng & Units 10/10/2017 10/09/2017 10/08/2017  WBC 4.0 - 10.5 K/uL 12.2(H) 12.5(H) 11.6(H)  Hemoglobin 13.0 - 17.0 g/dL 12.4(L) 12.5(L) 11.7(L)  Hematocrit 39.0 - 52.0 % 36.5(L) 36.9(L) 34.3(L)  Platelets 150 - 400 K/uL 271 307 296   IMAGING: Dg Chest 2 View  Result Date: 10/09/2017 CLINICAL DATA:  Up of right pleural effusion EXAM: CHEST  2 VIEW COMPARISON:  Fluoro spot radiographs of  October 08, 2017 and chest x-ray of the same day. FINDINGS: The patient has undergone placement of a pigtail catheter in the right pleural space for drainage of the pleural effusion. There is no pneumothorax. The volume of pleural fluid does not appear appreciably changed. There is a soft tissue mass-like density in the right hilar region the cardiac silhouette is enlarged. The pulmonary vascularity is normal. The ICD is in stable position. There is dense calcification in the wall of the thoracic aorta. The bony thorax exhibits no acute abnormality. IMPRESSION: Persistent moderate-sized right pleural effusion. A pigtail catheter  is present now in the right pleural space. No postplacement complication. Persistent soft tissue prominence in the right perihilar region. Stable cardiomegaly without pulmonary edema. Thoracic aortic atherosclerosis. Electronically Signed   By: David  Martinique M.D.   On: 10/09/2017 09:52   Dg Chest Port 1 View  Result Date: 10/10/2017 CLINICAL DATA:  Pleural effusion on the right EXAM: PORTABLE CHEST 1 VIEW COMPARISON:  Yesterday FINDINGS: Pigtail pleural catheter on the right with smaller right pleural effusion. There is likely pneumothorax loculated at the right base. Trace left effusion. Chronic cardiomegaly. Dual-chamber ICD/pacer from the left. No Kerley lines. IMPRESSION: Decreasing right pleural effusion. Possible small loculated pneumothorax at the right base. Electronically Signed   By: Monte Fantasia M.D.   On: 10/10/2017 07:15   Ir Image Guided Drainage By Percutaneous Catheter  Result Date: 10/08/2017 INDICATION: 83 year old with a loculated right pleural effusion. Request for chest tube placement. EXAM: IMAGE GUIDED PLACEMENT OF RIGHT CHEST TUBE MEDICATIONS: Sedation medications ANESTHESIA/SEDATION: Fentanyl 25 mcg IV; Versed 1.0 mg IV Moderate Sedation Time:  15 minutes The patient was continuously monitored during the procedure by the interventional radiology nurse under my direct supervision. FLUOROSCOPY TIME:  24 seconds, 8 mGy COMPLICATIONS: None immediate. PROCEDURE: Informed written consent was obtained from the patient after a thorough discussion of the procedural risks, benefits and alternatives. All questions were addressed. Maximal Sterile Barrier Technique was utilized including caps, mask, sterile gowns, sterile gloves, sterile drape, hand hygiene and skin antiseptic. A timeout was performed prior to the initiation of the procedure. Right side of the chest was prepped and draped in a sterile fashion. Ultrasound was used to identify the loculated right pleural effusion. Skin was  anesthetized with 1% lidocaine. Yueh catheter was directed into a loculated collection with ultrasound guidance. Initially, no fluid could be aspirated. A second collection was targeted with ultrasound. A small amount of yellow fluid was aspirated. A stiff Amplatz wire was advanced into the pleural space. Tract was dilated to accommodate a 12 [place] multipurpose drain. Approximately 20 mL of brown purulent fluid was aspirated. Catheter was sutured to skin and attached to a Pleur-Evac. Fluoroscopic and ultrasound images were taken and saved for documentation. FINDINGS: Right pleural collection is very loculated. Approximately 20 mL of purulent fluid was removed. Findings are compatible with an empyema. IMPRESSION: Placement of right chest tube using ultrasound and fluoroscopic guidance. Purulent fluid was removed from the right pleural space. Findings are compatible with an empyema. Electronically Signed   By: Markus Daft M.D.   On: 10/08/2017 17:46    DISCUSSION: 83 yo male with probable aspiration pneumonia causing parapneumonic right loculated pleural effusion.  He had antibiotics changed to have better coverage for aspiration on 12/15.  Palliative care has been consulted.  He has history of COPD with emphysema and combined CHF.  ASSESSMENT / PLAN:  Exudate, neutrophil predominant Rt pleural effusion likely from recurrent aspiration pneumonia.  Continue unasyn F/u 12/18 Pleural fluid cultures >> Unidentified organism, final read pending S/p IR indwelling pigtail catheter placement 12/18 w/ 500 total cc output thus far.  S/p intrapleural altepase and pulmozyme 12/19 CXR today with improvement in pleural effusion, ? Small loculated pneumothorax @ right lung base  Palliative care consulted for goals of care, will follow up recommendations  PT consult, out of bed if possible   Acute hypoxic respiratory failure. Wean O2 as able.   COPD with emphysema. BD's as ordered  DVT prophylaxis - SCDs,  holding lovenox  SUP - not indicated Nutrition - D3 diet per SLP  Goals of care - full code  Velna Ochs, M.D. - PGY2 Pager: [phone] 10/10/2017, 8:53 AM   Attending Note:  I have examined patient, reviewed labs, studies and notes. I have discussed the case with Dr Philipp Ovens, and I agree with the data and plans as amended above.   83 year old gentleman with probable aspiration pneumonia and a right parapneumonic pleural effusion.  A pigtail catheter has been placed and 500 cc of fluid obtained following lytics/DNase instillation.  Chest x-ray today shows some improvement but residual right pleural fluid with a small loculated right pneumothorax consistent with inability of the lung to reexpand.   On exam the patient is comfortable.  He has decreased breath sounds on the right.  He tells me that his breathing really has not changed very much since the tube was placed.  Exam is otherwise unchanged.  I broach the subject with him of reinstallation of lytics to see if we can get more fluid out.  We also discussed any potential role for thoracic surgery and VATS decortication.  At this point I feel like the potential morbidity associated with VATS outweighs any significant benefit.  I would not recommend surgery.  He tends to agree with me.  I will follow-up chest x-ray on 12/21 and we will decide about reinstallation of lytics  Baltazar Apo, MD, PhD 10/10/2017, 1:02 PM [facility] [phone] or if no answer [phone]     Baltazar Apo, MD, PhD 10/10/2017, 12:58 PM [facility] [phone] or if no answer [phone]

## 2017-10-10 NOTE — Progress Notes (Addendum)
Physical Therapy Treatment Patient Details Name: [name] MRN: [id number] DOB: 1931/04/29 Today's Date: 10/10/2017    History of Present Illness 83 y.o. 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 Pt stood with RW and performed marching in standing x 5 reps bilaterally. Standing tolerance limited by dizziness. BP seated 119/86, pt not able to stand long enough to get standing BP. SaO2 93-94% on 3L O2 [place] during activity.  Instructed pt in LE exercises to perform independently to minimize deconditioning.    Follow Up Recommendations  SNF;Supervision/Assistance - 24 hour     Equipment Recommendations  Rolling walker with 5" wheels    Recommendations for Other Services       Precautions / Restrictions Precautions Precautions: Fall;Other (comment) Precaution Comments: chest tube, h/o falls in past year Restrictions Weight Bearing Restrictions: No    Mobility  Bed Mobility Overal bed mobility: Needs Assistance Bed Mobility: Rolling;Sidelying to Sit Rolling: Min guard Sidelying to sit: Min guard       General bed mobility comments: pt/daughter report pt has recent h/o compression fractures in spine, instructed pt in log roll  Transfers Overall transfer level: Needs assistance Equipment used: Rolling walker (2 wheeled) Transfers: Sit to/from [facility] Sit to Stand: Min assist;From elevated surface Stand pivot transfers: Min guard       General transfer comment: performed marching in standing with RW x 5 reps B, then took a few steps to  recliner, standing tolerance limited by mild dizziness/fatigue, pt not able to stand long enough to get standing BP, seated BP 119/86  Ambulation/Gait                 Stairs            Wheelchair Mobility    Modified Rankin (Stroke Patients Only)       Balance Overall balance assessment: Needs assistance Sitting-balance support: Feet supported Sitting balance-Leahy Scale: Good     Standing balance support: Bilateral upper extremity supported Standing balance-Leahy Scale: Poor Standing balance comment: requires BUE support                            Cognition Arousal/Alertness: Awake/alert Behavior During Therapy: WFL for tasks assessed/performed Overall Cognitive Status: Within Functional Limits for tasks assessed                                        Exercises General Exercises - Lower Extremity Ankle Circles/Pumps: AROM;Both;15 reps;Supine Short Arc Quad: AROM;Both;10 reps;Seated    General Comments        Pertinent Vitals/Pain Pain Assessment: No/denies pain Faces Pain Scale: Hurts little more Pain Intervention(s): Monitored during session;Repositioned    Home Living                      Prior Function            PT Goals (current goals can now be found in the care plan section) Acute Rehab PT  Goals PT Goal Formulation: With patient/family Time For Goal Achievement: 10/17/17 Potential to Achieve Goals: Good Progress towards PT goals: Progressing toward goals    Frequency    Min 3X/week      PT Plan Current plan remains appropriate    Co-evaluation              AM-PAC PT "6 Clicks" Daily Activity  Outcome Measure  Difficulty turning over in bed (including adjusting bedclothes, sheets and blankets)?: None Difficulty moving from lying on back to sitting on the side of the bed? : A Little Difficulty sitting down on and standing up from a chair with arms (e.g., wheelchair, bedside commode,  etc,.)?: A Little Help needed moving to and from a bed to chair (including a wheelchair)?: A Little Help needed walking in hospital room?: A Lot Help needed climbing 3-5 steps with a railing? : Total 6 Click Score: 16    End of Session Equipment Utilized During Treatment: Gait belt Activity Tolerance: Patient tolerated treatment well Patient left: in chair;with call bell/phone within reach;with family/visitor present Nurse Communication: Mobility status PT Visit Diagnosis: Difficulty in walking, not elsewhere classified (R26.2)     Time: 1100-1134 PT Time Calculation (min) (ACUTE ONLY): 34 min  Charges:  $Therapeutic Exercise: 8-22 mins $Therapeutic Activity: 8-22 mins                    G Codes:          Philomena Doheny 10/10/2017, 11:42 AM [phone]
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
## 2017-10-10 NOTE — Progress Notes (Signed)
Daily Progress Note   Patient Name: [name]       Date: 10/10/2017 DOB: 10-23-1930  Age: 83 y.o. MRN#: [id number] Attending Physician: Georgette Shell, MD Primary Care Physician: Noralee Space, MD Admit Date: 09/30/2017  Reason for Consultation/Follow-up: Establishing goals of care  Subjective: Met with with [name] this afternoon.  No other family present at time of encounter and he asked me to speak further with his daughter.  I then met with his daughter, [name], outside of his room.  See below:  Length of Stay: 10  Current Medications: Scheduled Meds:  . carvedilol  3.125 mg Oral BID WC  . furosemide  40 mg Oral Daily  . mouth rinse  15 mL Mouth Rinse BID  . oxybutynin  5 mg Oral BID  . sertraline  50 mg Oral QHS    Continuous Infusions: . ampicillin-sulbactam (UNASYN) IV Stopped (10/10/17 1328)    PRN Meds: albuterol, guaiFENesin-dextromethorphan, RESOURCE THICKENUP CLEAR, traMADol  Physical Exam       General: Alert, awake, in no acute distress.  HEENT: No bruits, no goiter, no JVD Heart: Regular rate and rhythm. No murmur appreciated. Lungs: Throughout.  Diminished, particularly the right base Abdomen: Soft, nontender, nondistended, positive bowel sounds.  Ext: Trace edema Skin: Warm and dry Neuro: Grossly intact, nonfocal.  Vital Signs: BP (!) 103/45 (BP Location: Left Arm) Comment: Rn notified   Pulse (!) 59 Comment: RN notified   Temp 98.3 F (36.8 C) (Oral)   Resp 18   Ht 5' 11"  (1.803 m)   Wt 75.3 kg (166 lb 0.1 oz)   SpO2 100%   BMI 23.15 kg/m  SpO2: SpO2: 100 % O2 Device: O2 Device: Nasal Cannula O2 Flow Rate: O2 Flow Rate (L/min): 3 L/min  Intake/output summary:   Intake/Output Summary (Last 24 hours) at 10/10/2017 1627 Last  data filed at 10/10/2017 1400 [place] per 24 hour  Intake 1000 ml  Output 680 ml  Net 320 ml   LBM: Last BM Date: 10/10/17 Baseline Weight: Weight: 77.1 kg (170 lb) Most recent weight: Weight: 75.3 kg (166 lb 0.1 oz)       Palliative Assessment/Data:    Flowsheet Rows     Most Recent Value  Intake Tab  Referral Department  Hospitalist  Unit at Time of  Referral  Med/Surg Unit  Palliative Care Primary Diagnosis  Neurology  Date Notified  10/05/17  Palliative Care Type  New Palliative care  Reason for referral  Clarify Goals of Care  Date of Admission  09/30/17  Date first seen by Palliative Care  10/06/17  # of days Palliative referral response time  1 Day(s)  # of days IP prior to Palliative referral  5  Clinical Assessment  Psychosocial & Spiritual Assessment  Palliative Care Outcomes      Patient Active Problem List   Diagnosis Date Noted  . Pleural effusion   . Pressure injury of skin 10/01/2017  . Acute hypoxemic respiratory failure ([place]) 09/30/2017  . CHF exacerbation ([place]) 09/30/2017  . Shingles outbreak 05/27/2017  . Seborrheic dermatitis 05/27/2017  . Hypotension due to drugs 04/01/2017  . Acute on chronic respiratory failure with hypoxia ([place]) 01/13/2017  . COPD with acute bronchitis ([place]) 01/13/2017  . CAD S/P percutaneous coronary angioplasty   . Chronic combined systolic and diastolic CHF (congestive heart failure) ([place])   . Blunt chest trauma 05/04/2016  . 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 SIADH (syndrome of inappropriate ADH production) ([place])   . Hypertensive heart disease with heart failure ([place])   . Orthostatic hypotension 04/12/2016  . TBI (traumatic brain injury) ([place])   . Acute on chronic combined systolic and diastolic congestive heart failure ([place])   . S/P ORIF (open reduction internal fixation) fracture   . Thrombocytopenia ([place])   . Urinary retention   . Slow transit constipation   . Thyroid activity decreased   . IVCD  (intraventricular conduction defect) 04/05/2016  . Traumatic subdural hematoma with loss of consciousness ([place])   . Pulmonary hypertension ([place])   . Cardiomyopathy, ischemic   . Subdural hematoma ([place])   . Ventricular tachycardia ([place]) 04/01/2016  . Cardiac arrest ([place]) 03/29/2016  . 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 CAP (community acquired pneumonia) 04/28/2015  . T wave over sensing resulting in inappropriate shocks 08/14/2013  . Vitamin D deficiency disease 04/09/2012  . Actinic skin damage 05/22/2011  . Alcohol use 03/22/2011  . Other dysphagia 02/16/2011  . Carotid arterial disease ([place]) 11/17/2009  . GOUT 09/23/2009  . SYNCOPE 04/19/2009  . Closed fracture of bone 07/27/2008  . Automatic implantable cardioverter-defibrillator in situ 04/13/2008  . COLONIC POLYPS 12/09/2007  . DIVERTICULOSIS OF COLON 12/09/2007  . Essential hypertension 12/08/2007  . Coronary atherosclerosis 12/08/2007  . Peripheral vascular disease ([place]) 12/08/2007  . COPD GOLD III 12/08/2007  . 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  Palliative have consulted for goals of care.  Assessment: Patient Active Problem List   Diagnosis Date Noted  . Pleural effusion   . Pressure injury of skin 10/01/2017  . Acute hypoxemic respiratory failure () 09/30/2017  . CHF exacerbation ([place]) 09/30/2017  . Shingles outbreak 05/27/2017  . Seborrheic dermatitis 05/27/2017  . Hypotension due to drugs 04/01/2017  . Acute on chronic respiratory failure with hypoxia ([place]) 01/13/2017  . COPD with  acute bronchitis ([place]) 01/13/2017  . CAD S/P percutaneous coronary angioplasty   . Chronic combined systolic and diastolic CHF (congestive heart  failure) ([place])   . Blunt chest trauma 05/04/2016  . 4.1 cm Aneurysm of thoracic aorta ([place]) 04/27/2016  . SIADH (syndrome of inappropriate ADH production) ([place])   . Hypertensive heart disease with heart failure ([place])   . Orthostatic hypotension 04/12/2016  . TBI (traumatic brain injury) ([place])   . Acute on chronic combined systolic and diastolic congestive heart failure ([place])   . S/P ORIF (open reduction internal fixation) fracture   . Thrombocytopenia ([place])   . Urinary retention   . Slow transit constipation   . Thyroid activity decreased   . IVCD (intraventricular conduction defect) 04/05/2016  . Traumatic subdural hematoma with loss of consciousness ([place])   . Pulmonary hypertension ([place])   . Cardiomyopathy, ischemic   . Subdural hematoma ([place])   . Ventricular tachycardia ([place]) 04/01/2016  . Cardiac arrest ([place]) 03/29/2016  . 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 CAP (community acquired pneumonia) 04/28/2015  . T wave over sensing resulting in inappropriate shocks 08/14/2013  . Vitamin D deficiency disease 04/09/2012  . Actinic skin damage 05/22/2011  . Alcohol use 03/22/2011  . Other dysphagia 02/16/2011  . Carotid arterial disease ([place]) 11/17/2009  . GOUT 09/23/2009  . SYNCOPE 04/19/2009  . Closed fracture of bone 07/27/2008  . Automatic implantable cardioverter-defibrillator in situ 04/13/2008  . COLONIC POLYPS 12/09/2007  . DIVERTICULOSIS OF COLON 12/09/2007  . Essential hypertension 12/08/2007  . Coronary atherosclerosis 12/08/2007  . Peripheral vascular disease ([place]) 12/08/2007  . COPD GOLD III 12/08/2007  . Osteoarthritis 12/08/2007   Recommendations/Plan:  S/p tube placement and now getting second dose tpa/pumlozyme.  Daughter has brought in copy of his HCPOA paperwork for our records.  Paperwork names  his wife (deceased) and his 3 children [name], [place], and [place]) as his surrogate decision makers with the ability to act singly on his behalf.  Patient did not wish to discuss goals of care further with me.  He did asked me to speak with his daughter.  I discussed at length with his daughter, [name], regarding long-term goals of care and provided a MOST form for her and her family to review.  She tells me that when her mother was sick that her father would "avoid the situation" and she feels that that he is doing this with his own care as well.  Family to call if I can be of further assistance or if they would like to complete MOST form prior to discharge.  Goals of Care and Additional Recommendations:  Limitations on Scope of Treatment: Full Scope Treatment  Code Status:    Code Status Orders  (From admission, onward)        Start     Ordered   09/30/17 1148  Full code  Continuous     09/30/17 1148    Code Status History    Date Active Date Inactive Code Status Order ID Comments User Context   01/12/2017 20:26 01/14/2017 23:22 Full Code [id number]  Caren Griffins, MD Inpatient   04/09/2016 16:36 04/27/2016 15:45 Full Code [id number]  Bary Leriche, PA-C Inpatient   03/29/2016 09:38 04/09/2016 16:36 Full Code [id number]  Awilda Bill, NP ED   12/13/2015 13:32 12/14/2015 13:04 Full Code [id number]  Melissa Montane, MD Inpatient    Advance Directive Documentation     Most Recent Value  Type of Advance Directive  Healthcare Power of Attorney  Pre-existing out of facility DNR order (yellow form or pink MOST form)  No data  "MOST"  Form in Place?  No data       Prognosis:   Unable to determine  Discharge Planning:  [place] for rehab with Palliative care service follow-up  Care plan was discussed with patient, daughter [name]) bedside RN  Thank you for allowing the Palliative Medicine Team to assist in the care of this patient.   Total Time 45 Prolonged Time Billed  No      Greater than 50%  of this time was spent counseling and coordinating care related to the above assessment and plan.  Micheline Rough, MD  Please contact Palliative Medicine Team phone at [phone] for questions and concerns.

## 2017-10-10 NOTE — Progress Notes (Signed)
CSW following for disposition.  While not yet medically stable, current recommendation is that at DC pt admit to SNF for rehab. Pt has been referred to area SNFs and weekend CSW discussed bed offers with pt- he deferred to daughter for selection- that CSW unable to contact daughter. CSW followed up to leave voicemail with daughter today- requested call back in order to make contact about proposed DC plans and potential SNF selection.  Sharren Bridge, MSW, LCSW Clinical Social Work 10/10/2017 [phone]

## 2017-10-10 NOTE — Progress Notes (Signed)
PROGRESS NOTE    [name]  ZOX:[id number] DOB: 1931-08-04 DOA: 09/30/2017 PCP: Noralee Space, MD  Brief Narrative:83 y.o.malewith medical history significant ofchronic combined diastolic and systolic congestive heart failure, CAD, hypertension, hyperlipidemia, vascular disease who presentedto the emergency department with complaints of increased cough and congestion started 2 weeks prior to hospital admission. Chest x-ray demonstrated a right lower lobe pneumonia and right pleural effusion. He did not improve withantibiotics and diuresis.CT chest demonstrated a moderate right-sided pleural effusion without loculation. Heunderwent thoracentesis on 12/14. The fluid was exudative and loculated. Pulmonology and palliative care have been consulted. His antibiotics have been changed to those that can cover anaerobes as he is aspirating.Despite an improvement in his leukocytosis, his chest x-ray continues to look worse. He underwent pigtail catheter placement on 12/18 by IR and pulmonology is discussing possible DNase or lytics if effusion doesn't drain well.  12/20-no specific complaints.  Assessment & Plan:   Principal Problem:   Acute hypoxemic respiratory failure (HCC) Active Problems:   Essential hypertension   Carotid arterial disease (HCC)   CAP (community acquired pneumonia)   Cardiomyopathy, ischemic   Subdural hematoma (HCC)   Traumatic subdural hematoma with loss of consciousness (HCC)   Pulmonary hypertension (HCC)   TBI (traumatic brain injury) ([place])   Thrombocytopenia ([place])   Hypertensive heart disease with heart failure (HCC)   Chronic combined systolic and diastolic CHF (congestive heart failure) (HCC)   COPD with acute bronchitis (HCC)   CHF exacerbation (HCC)   Pressure injury of skin   Pleural effusion  1. Acute hypoxemic respiratory failure secondary to pleural effusion and aspiration pneumonia, leukocytosis appears to be improving on new  antibiotics 1. Continue Unasyn day4of anaerobic coverage 2. Pulmonologyand IR assistanceappreciated 3. Pigtail  12/18 4. Repeat CXR improved effusion.small pneumothorax. 2. HTN, mildly hypotensive 1. Cont very low-dose carvedilol 3. Dysphagia 1. Aspiration precautions 2. Diet advanced by speech therapy today to dysphagia 3 3. Appreciate speech therapy assistance 4. Patient declines feeding tube 4. Acute on chronic combined sys and dias chf exacerbation 1. Most recent 2d echo with EF 30-35% 2. Presenting BNP of 1348, was 496 on 01/22/17 3. Continue oral Lasix 5. COPD 1. No wheezing at this time 6. Hx subdural hematoma 1. Stable at this time 7. Prolonged QTc, paced rhythm 1. QTc of 527. Would    DVT prophylaxis: Lovenox Code Status: Full code Family Communication: No family available Disposition Plan: Plan to discharge to sniff once medically stable. Consultants:  Pulmonology  Procedures: Right chest tube Antimicrobials: Unasyn Subjective: No new complaints  Objective: Resting in bed in no acute distress watching TV denies any increasing shortness of breath chest pain nausea vomiting or diarrhea. Vitals:   10/09/17 2005 10/10/17 0500 10/10/17 0605 10/10/17 1128  BP: (!) 133/52  118/68 119/86  Pulse: (!) 112  60 61  Resp: 20  16   Temp: 100.2 F (37.9 C)  99 F (37.2 C)   TempSrc: Oral  Oral   SpO2: 97%  94% 93%  Weight:  75.3 kg (166 lb 0.1 oz)    Height:        Intake/Output Summary (Last 24 hours) at 10/10/2017 1152 Last data filed at 10/10/2017 0900 Gross per 24 hour  Intake 760 ml  Output 1480 ml  Net -720 ml   Filed Weights   10/08/17 0445 10/09/17 0457 10/10/17 0500  Weight: 77.9 kg (171 lb 11.8 oz) 78.9 kg (173 lb 15.1 oz) 75.3 kg (166 lb 0.1 oz)  Examination:  General exam: Appears calm and comfortable  Respiratory system: Clear to auscultation. Respiratory effort normal. Cardiovascular system: S1 & S2 heard, RRR. No JVD, murmurs, rubs,  gallops or clicks. No pedal edema. Gastrointestinal system: Abdomen is nondistended, soft and nontender. No organomegaly or masses felt. Normal bowel sounds heard. Central nervous system: Alert and oriented. No focal neurological deficits. Extremities: Symmetric 5 x 5 power. Skin: No rashes, lesions or ulcers Psychiatry: Judgement and insight appear normal. Mood & affect appropriate.     Data Reviewed: I have personally reviewed following labs and imaging studies  CBC: Recent Labs  Lab 10/06/17 0605 10/07/17 0426 10/08/17 0423 10/09/17 0419 10/10/17 0419  WBC 14.0* 11.9* 11.6* 12.5* 12.2*  HGB 12.1* 12.6* 11.7* 12.5* 12.4*  HCT 35.2* 36.3* 34.3* 36.9* 36.5*  MCV 98.1 97.3 97.7 98.1 98.4  PLT 292 294 296 307 412   Basic Metabolic Panel: Recent Labs  Lab 10/04/17 0318  10/06/17 0605 10/07/17 0426 10/08/17 0423 10/09/17 0419 10/10/17 0419  NA 132*   < > 135 135 136 134* 135  K 2.9*   < > 3.5 3.9 3.6 3.7 3.6  CL 95*   < > 96* 98* 98* 95* 96*  CO2 29   < > 31 29 29 31  32  GLUCOSE 149*   < > 109* 102* 102* 101* 133*  BUN 43*   < > 31* 24* 18 16 15   CREATININE 1.02   < > 0.96 0.79 0.78 0.92 1.03  CALCIUM 8.2*   < > 8.1* 7.9* 8.0* 8.0* 8.1*  MG 2.0  --   --   --   --   --   --    < > = values in this interval not displayed.   GFR: Estimated Creatinine Clearance: 56.9 mL/min (by C-G formula based on SCr of 1.03 mg/dL). Liver Function Tests: Recent Labs  Lab 10/06/17 0605 10/07/17 0426 10/08/17 0423 10/09/17 0419 10/10/17 0419  AST 117* 102* 81* 72* 66*  ALT 101* 94* 82* 76* 67*  ALKPHOS 65 64 62 67 63  BILITOT 0.8 0.9 0.5 0.9 0.7  PROT 5.9* 5.5* 5.5* 6.0* 5.8*  ALBUMIN 1.9* 1.9* 1.9* 1.9* 1.8*   No results for input(s): LIPASE, AMYLASE in the last 168 hours. No results for input(s): AMMONIA in the last 168 hours. Coagulation Profile: Recent Labs  Lab 10/08/17 1040  INR 1.15   Cardiac Enzymes: Recent Labs  Lab 10/06/17 0841  CKTOTAL 16*   BNP (last 3  results) Recent Labs    01/22/17 1231  PROBNP 496.0*   HbA1C: No results for input(s): HGBA1C in the last 72 hours. CBG: No results for input(s): GLUCAP in the last 168 hours. Lipid Profile: No results for input(s): CHOL, HDL, LDLCALC, TRIG, CHOLHDL, LDLDIRECT in the last 72 hours. Thyroid Function Tests: No results for input(s): TSH, T4TOTAL, FREET4, T3FREE, THYROIDAB in the last 72 hours. Anemia Panel: No results for input(s): VITAMINB12, FOLATE, FERRITIN, TIBC, IRON, RETICCTPCT in the last 72 hours. Sepsis Labs: Recent Labs  Lab 10/04/17 0318  PROCALCITON 0.65    Recent Results (from the past 240 hour(s))  Culture, blood (routine x 2) Call MD if unable to obtain prior to antibiotics being given     Status: None   Collection Time: 09/30/17  1:37 PM  Result Value Ref Range Status   Specimen Description BLOOD RIGHT FOREARM  Final   Special Requests   Final    BOTTLES DRAWN AEROBIC AND ANAEROBIC Blood Culture adequate  volume   Culture   Final    NO GROWTH 5 DAYS Performed at [facility], [place] [address] [place], [place] [address]    Report Status 10/05/2017 FINAL  Final  MRSA PCR Screening     Status: None   Collection Time: 09/30/17  7:17 PM  Result Value Ref Range Status   MRSA by PCR NEGATIVE NEGATIVE Final    Comment:        The GeneXpert MRSA Assay (FDA approved for NASAL specimens only), is one component of a comprehensive MRSA colonization surveillance program. It is not intended to diagnose MRSA infection nor to guide or monitor treatment for MRSA infections.   Culture, blood (routine x 2) Call MD if unable to obtain prior to antibiotics being given     Status: None   Collection Time: 09/30/17  7:39 PM  Result Value Ref Range Status   Specimen Description BLOOD RIGHT ANTECUBITAL  Final   Special Requests IN PEDIATRIC BOTTLE Blood Culture adequate volume  Final   Culture   Final    NO GROWTH 5 DAYS Performed at [facility], [place] [address], [place], [place] [address]    Report Status 10/05/2017 FINAL  Final  Body fluid culture     Status: None   Collection Time: 10/04/17  4:40 PM  Result Value Ref Range Status   Specimen Description PLEURAL  Final   Special Requests NONE  Final   Gram Stain   Final    ABUNDANT WBC PRESENT,BOTH PMN AND MONONUCLEAR NO ORGANISMS SEEN    Culture   Final    NO GROWTH 3 DAYS Performed at [facility], [place] [address], [place], [place] [address]    Report Status 10/08/2017 FINAL  Final  Body fluid culture     Status: None (Preliminary result)   Collection Time: 10/08/17  1:03 PM  Result Value Ref Range Status   Specimen Description PLEURAL RIGHT  Final   Special Requests NONE  Final   Gram Stain   Final    RARE WBC PRESENT, PREDOMINANTLY PMN FEW GRAM POSITIVE COCCI IN PAIRS IN CHAINS RARE GRAM VARIABLE ROD    Culture   Final    FEW UNIDENTIFIED ORGANISM Performed at [facility], [address] [place], [place] [address]    Report Status PENDING  Incomplete         Radiology Studies: Dg Chest 2 View  Result Date: 10/09/2017 CLINICAL DATA:  Up of right pleural effusion EXAM: CHEST  2 VIEW COMPARISON:  Fluoro spot radiographs of October 08, 2017 and chest x-ray of the same day. FINDINGS: The patient has undergone placement of a pigtail catheter in the right pleural space for drainage of the pleural effusion. There is no pneumothorax. The volume of pleural fluid does not appear appreciably changed. There is a soft tissue mass-like density in the right hilar region the cardiac silhouette is enlarged. The pulmonary vascularity is normal. The ICD is in stable position. There is dense calcification in the wall of the thoracic aorta. The bony thorax exhibits no acute abnormality. IMPRESSION: Persistent moderate-sized right pleural effusion. A pigtail catheter is present now in the right pleural space. No postplacement complication. Persistent soft tissue prominence in the right  perihilar region. Stable cardiomegaly without pulmonary edema. Thoracic aortic atherosclerosis. Electronically Signed   By: David  Martinique M.D.   On: 10/09/2017 09:52   Dg Chest Port 1 View  Result Date: 10/10/2017 CLINICAL DATA:  Pleural effusion on the right EXAM: PORTABLE CHEST 1 VIEW COMPARISON:  Yesterday FINDINGS: Pigtail pleural catheter on the right with smaller right pleural effusion. There is likely pneumothorax loculated at the right base. Trace left effusion. Chronic cardiomegaly. Dual-chamber ICD/pacer from the left. No Kerley lines. IMPRESSION: Decreasing right pleural effusion. Possible small loculated pneumothorax at the right base. Electronically Signed   By: Monte Fantasia M.D.   On: 10/10/2017 07:15   Ir Image Guided Drainage By Percutaneous Catheter  Result Date: 10/08/2017 INDICATION: 83 year old with a loculated right pleural effusion. Request for chest tube placement. EXAM: IMAGE GUIDED PLACEMENT OF RIGHT CHEST TUBE MEDICATIONS: Sedation medications ANESTHESIA/SEDATION: Fentanyl 25 mcg IV; Versed 1.0 mg IV Moderate Sedation Time:  15 minutes The patient was continuously monitored during the procedure by the interventional radiology nurse under my direct supervision. FLUOROSCOPY TIME:  24 seconds, 8 mGy COMPLICATIONS: None immediate. PROCEDURE: Informed written consent was obtained from the patient after a thorough discussion of the procedural risks, benefits and alternatives. All questions were addressed. Maximal Sterile Barrier Technique was utilized including caps, mask, sterile gowns, sterile gloves, sterile drape, hand hygiene and skin antiseptic. A timeout was performed prior to the initiation of the procedure. Right side of the chest was prepped and draped in a sterile fashion. Ultrasound was used to identify the loculated right pleural effusion. Skin was anesthetized with 1% lidocaine. Yueh catheter was directed into a loculated collection with ultrasound guidance. Initially,  no fluid could be aspirated. A second collection was targeted with ultrasound. A small amount of yellow fluid was aspirated. A stiff Amplatz wire was advanced into the pleural space. Tract was dilated to accommodate a 12 [place] multipurpose drain. Approximately 20 mL of brown purulent fluid was aspirated. Catheter was sutured to skin and attached to a Pleur-Evac. Fluoroscopic and ultrasound images were taken and saved for documentation. FINDINGS: Right pleural collection is very loculated. Approximately 20 mL of purulent fluid was removed. Findings are compatible with an empyema. IMPRESSION: Placement of right chest tube using ultrasound and fluoroscopic guidance. Purulent fluid was removed from the right pleural space. Findings are compatible with an empyema. Electronically Signed   By: Markus Daft M.D.   On: 10/08/2017 17:46        Scheduled Meds: . carvedilol  3.125 mg Oral BID WC  . furosemide  40 mg Oral Daily  . mouth rinse  15 mL Mouth Rinse BID  . oxybutynin  5 mg Oral BID  . sertraline  50 mg Oral QHS   Continuous Infusions: . ampicillin-sulbactam (UNASYN) IV Stopped (10/10/17 0263)     LOS: 10 days       Georgette Shell, MD [facility]  If 7PM-7AM, please contact night-coverage [email or web address] Password [email or web address] 10/10/2017, 11:52 AM

## 2017-10-11 ENCOUNTER — Inpatient Hospital Stay (HOSPITAL_COMMUNITY): Payer: Medicare Other

## 2017-10-11 LAB — COMPREHENSIVE METABOLIC PANEL
ALT: 50 U/L (ref 17–63)
AST: 40 U/L (ref 15–41)
Albumin: 1.8 g/dL — ABNORMAL LOW (ref 3.5–5.0)
Alkaline Phosphatase: 58 U/L (ref 38–126)
Anion gap: 7 (ref 5–15)
BILIRUBIN TOTAL: 0.8 mg/dL (ref 0.3–1.2)
BUN: 15 mg/dL (ref 6–20)
CALCIUM: 7.9 mg/dL — AB (ref 8.9–10.3)
CO2: 30 mmol/L (ref 22–32)
Chloride: 97 mmol/L — ABNORMAL LOW (ref 101–111)
Creatinine, Ser: 0.95 mg/dL (ref 0.61–1.24)
GFR calc Af Amer: >60 mL/min (ref >60)
Glucose, Bld: 92 mg/dL (ref 65–99)
POTASSIUM: 3.2 mmol/L — AB (ref 3.5–5.1)
Sodium: 134 mmol/L — ABNORMAL LOW (ref 135–145)
TOTAL PROTEIN: 5.7 g/dL — AB (ref 6.5–8.1)

## 2017-10-11 LAB — BODY FLUID CULTURE

## 2017-10-11 LAB — CBC
HEMATOCRIT: 35.6 % — AB (ref 39.0–52.0)
Hemoglobin: 12.2 g/dL — ABNORMAL LOW (ref 13.0–17.0)
MCH: 33.1 pg (ref 26.0–34.0)
MCHC: 34.3 g/dL (ref 30.0–36.0)
MCV: 96.5 fL (ref 78.0–100.0)
Platelets: 239 10*3/uL (ref 150–400)
RBC: 3.69 MIL/uL — ABNORMAL LOW (ref 4.22–5.81)
RDW: 13.1 % (ref 11.5–15.5)
WBC: 15.6 10*3/uL — AB (ref 4.0–10.5)

## 2017-10-11 NOTE — Progress Notes (Signed)
Name: [name] MRN: [id number] DOB: 1930/12/26    ADMISSION DATE:  09/30/2017 CONSULTATION DATE:  10/05/2017  REFERRING MD :  Dr. Sheran Fava  CHIEF COMPLAINT:  Loculated pleural effusion  HISTORY OF PRESENT ILLNESS:   83 yo male presented with 2 weeks of dyspnea.  Treated for pneumonia.  Found to have Rt effusion.  PMHx of COPD, combined CHF, CAD, HTN, HLD.  SIGNIFICANT EVENTS  12/10 admit 12/13 CT chest with effusion  12/14 Thoracentesis 12/15 PCCM consult 12/18 IR pigtail catheter placement  12/19 S/p intrapleural altepase and pulmozyme 12/20 Second dose of intrapleural tPA and pulmozyme   STUDIES:  Echo 12/10 >> mild LVH, EF 30 to 35%, grade 2 DD, mild AS, PAS 43 mmHg  CT chest 12/13 >> Moderate R-sided pleural effusion without loculation. Bronchus intermedius compression or obstruction. Resultant right middle lobe and right lower lobe volume loss. Probable mucoid impaction at the left apex. Emphysema.  Rt thoracentesis 12/14 >> glucose 85, protein 3.7, LDH 984, WBC 1175 (82%N)  CULTURES: Blood 12/10 >> negative (final) Pleural fluid 12/10 >> NG x 3 days (final) Pleural fluid 12/18 >> Viridans streptococcus, susceptibilities pending   ANTIBIOTICS: Rocephin 12/10 >> 12/16 Zithromax 12/10 >> 12/15 Unasyn 12/15 >>   SUBJECTIVE:  Laying in bed eating breakfast, appears comfortable. Tolerated second dose of intrapleural tPA and mucolytic yesterday without complication. Denies chest pain / SOB.   VITAL SIGNS: BP (!) 101/54 (BP Location: Left Arm)   Pulse 60   Temp 97.9 F (36.6 C) (Oral)   Resp 20   Ht 5\' 11"  (1.803 m)   Wt 167 lb 12.3 oz (76.1 kg)   SpO2 100%   BMI 23.40 kg/m   INTAKE/OUTPUT: I/O last 3 completed shifts: In: 1200 [P.O.:600; IV Piggyback:600] Out: 1385 [Urine:525; Chest Tube:860]  PHYSICAL EXAMINATION:    Gen: Pleasant, well-nourished, in no distress,  normal affect  ENT: No lesions,  mouth clear,  oropharynx clear, no postnasal  drip  Neck: No JVD, no TMG, no carotid bruits  Lungs: No use of accessory muscles, bibasilar crackles, decreased breath sounds on R  Cardiovascular: RRR, heart sounds normal, 3/6 holosystolic murmur  Musculoskeletal: No deformities, no cyanosis or clubbing  Neuro: alert, non focal  Skin: Warm, no lesions or rashes    LABS: CMP Latest Ref Rng & Units 10/11/2017 10/10/2017 10/09/2017  Glucose 65 - 99 mg/dL 92 133(H) 101(H)  BUN 6 - 20 mg/dL 15 15 16   Creatinine 0.61 - 1.24 mg/dL 0.95 1.03 0.92  Sodium 135 - 145 mmol/L 134(L) 135 134(L)  Potassium 3.5 - 5.1 mmol/L 3.2(L) 3.6 3.7  Chloride 101 - 111 mmol/L 97(L) 96(L) 95(L)  CO2 22 - 32 mmol/L 30 32 31  Calcium 8.9 - 10.3 mg/dL 7.9(L) 8.1(L) 8.0(L)  Total Protein 6.5 - 8.1 g/dL 5.7(L) 5.8(L) 6.0(L)  Total Bilirubin 0.3 - 1.2 mg/dL 0.8 0.7 0.9  Alkaline Phos 38 - 126 U/L 58 63 67  AST 15 - 41 U/L 40 66(H) 72(H)  ALT 17 - 63 U/L 50 67(H) 76(H)   CBC Latest Ref Rng & Units 10/11/2017 10/10/2017 10/09/2017  WBC 4.0 - 10.5 K/uL 15.6(H) 12.2(H) 12.5(H)  Hemoglobin 13.0 - 17.0 g/dL 12.2(L) 12.4(L) 12.5(L)  Hematocrit 39.0 - 52.0 % 35.6(L) 36.5(L) 36.9(L)  Platelets 150 - 400 K/uL 239 271 307   IMAGING: Dg Chest 2 View  Result Date: 10/09/2017 CLINICAL DATA:  Up of right pleural effusion EXAM: CHEST  2 VIEW COMPARISON:  Fluoro spot radiographs  of October 08, 2017 and chest x-ray of the same day. FINDINGS: The patient has undergone placement of a pigtail catheter in the right pleural space for drainage of the pleural effusion. There is no pneumothorax. The volume of pleural fluid does not appear appreciably changed. There is a soft tissue mass-like density in the right hilar region the cardiac silhouette is enlarged. The pulmonary vascularity is normal. The ICD is in stable position. There is dense calcification in the wall of the thoracic aorta. The bony thorax exhibits no acute abnormality. IMPRESSION: Persistent moderate-sized right  pleural effusion. A pigtail catheter is present now in the right pleural space. No postplacement complication. Persistent soft tissue prominence in the right perihilar region. Stable cardiomegaly without pulmonary edema. Thoracic aortic atherosclerosis. Electronically Signed   By: David  Martinique M.D.   On: 10/09/2017 09:52   Dg Chest Port 1 View  Result Date: 10/10/2017 CLINICAL DATA:  Acute chest pain. EXAM: PORTABLE CHEST 1 VIEW COMPARISON:  10/10/2017 and prior radiographs FINDINGS: Cardiomediastinal silhouette is unchanged. A left AICD/pacemaker again noted. A right pleural catheter/thoracostomy tube is again noted. Pleural air replaces the majority of the pleural fluid identified along the right lower lung which may represent pneumothorax ex vacuo. Continued right mid and lower lung consolidation/atelectasis noted. No other changes are identified. IMPRESSION: Moderate right basilar pneumothorax, with air replacing pleural fluid, and may represent pneumothorax ex vacuo. Unchanged right thoracostomy tube. Continued right mid and lower lung atelectasis/consolidation. Electronically Signed   By: Margarette Canada M.D.   On: 10/10/2017 18:23   Dg Chest Port 1 View  Result Date: 10/10/2017 CLINICAL DATA:  Pleural effusion on the right EXAM: PORTABLE CHEST 1 VIEW COMPARISON:  Yesterday FINDINGS: Pigtail pleural catheter on the right with smaller right pleural effusion. There is likely pneumothorax loculated at the right base. Trace left effusion. Chronic cardiomegaly. Dual-chamber ICD/pacer from the left. No Kerley lines. IMPRESSION: Decreasing right pleural effusion. Possible small loculated pneumothorax at the right base. Electronically Signed   By: Monte Fantasia M.D.   On: 10/10/2017 07:15    DISCUSSION: 83 yo male with probable aspiration pneumonia causing parapneumonic right loculated pleural effusion.  He had antibiotics changed to have better coverage for aspiration on 12/15.  Palliative care has been  consulted.  He has history of COPD with emphysema and combined CHF.  ASSESSMENT / PLAN:  Exudate, neutrophil predominant Rt pleural effusion likely from recurrent aspiration pneumonia. 12/18 pleural fluid grew viridans streptococcus, currently on Unasyn. Discussed with pharmacy, variable coverage. Consider switching to meropenum for better coverage. Will hold off and f/u sensitivities this afternoon.  S/p 2 doses of intrapleural tPA and pulmozyme 12/19 and 12/20 S/p IR indwelling pigtail catheter placement 12/18, w/ 850 total cc output thus far  Repeat CXR today pending PT consult, out of bed if possible   Acute hypoxic respiratory failure. Wean O2 as able.   COPD with emphysema. BD's as ordered  DVT prophylaxis - SCDs, holding lovenox  SUP - not indicated  Nutrition - D3 diet per SLP  Goals of care - full code  Velna Ochs, M.D. - PGY2 Pager: [phone] 10/11/2017, 8:25 AM

## 2017-10-11 NOTE — Progress Notes (Addendum)
PROGRESS NOTE    [name]  VOZ:[id number] DOB: Dec 16, 1930 DOA: 09/30/2017 PCP: Noralee Space, MD Brief Narrative::83 y.o.malewith medical history significant ofchronic combined diastolic and systolic congestive heart failure, CAD, hypertension, hyperlipidemia, vascular disease who presentedto the emergency department with complaints of increased cough and congestion started 2 weeks prior to hospital admission. Chest x-ray demonstrated a right lower lobe pneumonia and right pleural effusion. He did not improve withantibiotics and diuresis.CT chest demonstrated a moderate right-sided pleural effusion without loculation. Heunderwent thoracentesis on 12/14. The fluid was exudative and loculated. Pulmonology and palliative care have been consulted. His antibiotics have been changed to those that can cover anaerobes as he is aspirating.Despite an improvement in his leukocytosis, his chest x-ray continues to look worse. He underwent pigtail catheter placement on 12/18 by IR and pulmonology is discussing possible DNase or lytics if effusion doesn't drain well.  12/21-no specific complaints    Assessment & Plan:   Principal Problem:   Acute hypoxemic respiratory failure (HCC) Active Problems:   Essential hypertension   Carotid arterial disease (HCC)   CAP (community acquired pneumonia)   Cardiomyopathy, ischemic   Subdural hematoma (HCC)   Traumatic subdural hematoma with loss of consciousness (HCC)   Pulmonary hypertension (HCC)   TBI (traumatic brain injury) ([place])   Thrombocytopenia ([place])   Hypertensive heart disease with heart failure (HCC)   Chronic combined systolic and diastolic CHF (congestive heart failure) (HCC)   COPD with acute bronchitis (HCC)   CHF exacerbation (HCC)   Pressure injury of skin   Pleural effusion  Acute hypoxemic respiratory failure secondary to pleural effusion and aspiration pneumonia, leukocytosis appears to beincreasing.chest xrayPersistent  right lateral pneumothorax, slightly improved since yesterday. 2. Stable large right pleural effusion and small left pleural effusion. 3. Stable atelectasis and/or pneumonia involving the right middle lobe and right lower lobe. Stable mild left basilar atelectasis. 4. No new abnormalities. 1.  1. Continue Unasyn 2. Pulmonologyand IR assistanceappreciated 3. Pigtail  12/18 2. HTN, mildly hypotensive 1. Cont very low-dose carvedilol 3. Dysphagia 1. Aspiration precautions 2. Diet advanced by speech therapy today to dysphagia 3 3. Appreciate speech therapy assistance 4. Patient declines feeding tube 4. Acute on chronic combined sys and dias chf exacerbation 1. Most recent 2d echo with EF 30-35% 2. Presenting BNP of 1348, was 496 on 01/22/17 3. Continue oral Lasix 5. COPD 1. No wheezing at this time 6. Hx subdural hematoma 1. Stable at this time 7. Prolonged QTc, paced rhyth 8. worsenig leukocytosis-follow up tomorrow. 9. Hypotension dc coreg.on lasix 40 mg qd.       DVT prophylaxis: lovenox Code Status: full Family Communication: dw daughtwer Disposition Plan:tbd Consultants:  pccm,ir Procedures:right chest tube Antimicrobials:unasyn Subjective:no new complaints.feel like breathing slighly better.   Objective: Vitals:   10/11/17 0538 10/11/17 0933 10/11/17 0959 10/11/17 1317  BP: (!) 101/54 (!) 104/53  (!) 97/49  Pulse: 60 60  61  Resp: 20   20  Temp: 97.9 F (36.6 C)   (!) 97.5 F (36.4 C)  TempSrc: Oral   Oral  SpO2: 100%  97% 94%  Weight: 76.1 kg (167 lb 12.3 oz)     Height:        Intake/Output Summary (Last 24 hours) at 10/11/2017 1525 Last data filed at 10/11/2017 1300 Gross per 24 hour  Intake 540 ml  Output 705 ml  Net -165 ml   Filed Weights   10/09/17 0457 10/10/17 0500 10/11/17 0538  Weight: 78.9 kg (173 lb  15.1 oz) 75.3 kg (166 lb 0.1 oz) 76.1 kg (167 lb 12.3 oz)    Examination:  General exam: Appears calm and comfortable    Respiratory systemcrackles right base.Marland Kitchen auscultation. Respiratory effort normal. Cardiovascular system: S1 & S2 heard, RRR. No JVD, murmurs, rubs, gallops or clicks. No pedal edema. Gastrointestinal system: Abdomen is nondistended, soft and nontender. No organomegaly or masses felt. Normal bowel sounds heard. Central nervous system: Alert and oriented. No focal neurological deficits. Extremities: Symmetric 5 x 5 power. Skin: No rashes, lesions or ulcers Psychiatry: Judgement and insight appear normal. Mood & affect appropriate.     Data Reviewed: I have personally reviewed following labs and imaging studies  CBC: Recent Labs  Lab 10/07/17 0426 10/08/17 0423 10/09/17 0419 10/10/17 0419 10/11/17 0447  WBC 11.9* 11.6* 12.5* 12.2* 15.6*  HGB 12.6* 11.7* 12.5* 12.4* 12.2*  HCT 36.3* 34.3* 36.9* 36.5* 35.6*  MCV 97.3 97.7 98.1 98.4 96.5  PLT 294 296 307 271 505   Basic Metabolic Panel: Recent Labs  Lab 10/07/17 0426 10/08/17 0423 10/09/17 0419 10/10/17 0419 10/11/17 0447  NA 135 136 134* 135 134*  K 3.9 3.6 3.7 3.6 3.2*  CL 98* 98* 95* 96* 97*  CO2 29 29 31  32 30  GLUCOSE 102* 102* 101* 133* 92  BUN 24* 18 16 15 15   CREATININE 0.79 0.78 0.92 1.03 0.95  CALCIUM 7.9* 8.0* 8.0* 8.1* 7.9*   GFR: Estimated Creatinine Clearance: 61.6 mL/min (by C-G formula based on SCr of 0.95 mg/dL). Liver Function Tests: Recent Labs  Lab 10/07/17 0426 10/08/17 0423 10/09/17 0419 10/10/17 0419 10/11/17 0447  AST 102* 81* 72* 66* 40  ALT 94* 82* 76* 67* 50  ALKPHOS 64 62 67 63 58  BILITOT 0.9 0.5 0.9 0.7 0.8  PROT 5.5* 5.5* 6.0* 5.8* 5.7*  ALBUMIN 1.9* 1.9* 1.9* 1.8* 1.8*   No results for input(s): LIPASE, AMYLASE in the last 168 hours. No results for input(s): AMMONIA in the last 168 hours. Coagulation Profile: Recent Labs  Lab 10/08/17 1040  INR 1.15   Cardiac Enzymes: Recent Labs  Lab 10/06/17 0841  CKTOTAL 16*   BNP (last 3 results) Recent Labs    01/22/17 1231   PROBNP 496.0*   HbA1C: No results for input(s): HGBA1C in the last 72 hours. CBG: No results for input(s): GLUCAP in the last 168 hours. Lipid Profile: No results for input(s): CHOL, HDL, LDLCALC, TRIG, CHOLHDL, LDLDIRECT in the last 72 hours. Thyroid Function Tests: No results for input(s): TSH, T4TOTAL, FREET4, T3FREE, THYROIDAB in the last 72 hours. Anemia Panel: No results for input(s): VITAMINB12, FOLATE, FERRITIN, TIBC, IRON, RETICCTPCT in the last 72 hours. Sepsis Labs: No results for input(s): PROCALCITON, LATICACIDVEN in the last 168 hours.  Recent Results (from the past 240 hour(s))  Body fluid culture     Status: None   Collection Time: 10/04/17  4:40 PM  Result Value Ref Range Status   Specimen Description PLEURAL  Final   Special Requests NONE  Final   Gram Stain   Final    ABUNDANT WBC PRESENT,BOTH PMN AND MONONUCLEAR NO ORGANISMS SEEN    Culture   Final    NO GROWTH 3 DAYS Performed at [facility], [address] [place], [place] [address]    Report Status 10/08/2017 FINAL  Final  Body fluid culture     Status: None   Collection Time: 10/08/17  1:03 PM  Result Value Ref Range Status   Specimen Description  PLEURAL RIGHT  Final   Special Requests NONE  Final   Gram Stain   Final    RARE WBC PRESENT, PREDOMINANTLY PMN FEW GRAM POSITIVE COCCI IN PAIRS IN CHAINS RARE GRAM VARIABLE ROD Performed at [facility], [place] [address] [place], [place] [address]    Culture FEW STREPTOCOCCUS INTERMEDIUS  Final   Report Status 10/11/2017 FINAL  Final   Organism ID, Bacteria STREPTOCOCCUS INTERMEDIUS  Final      Susceptibility   Streptococcus intermedius - MIC*    PENICILLIN 0.12 SENSITIVE Sensitive     CEFTRIAXONE 0.25 SENSITIVE Sensitive     ERYTHROMYCIN <=0.12 SENSITIVE Sensitive     LEVOFLOXACIN 0.5 SENSITIVE Sensitive     VANCOMYCIN 0.5 SENSITIVE Sensitive     AMPICILLIN Value in next row Sensitive      SENSITIVE<=0.25    * FEW STREPTOCOCCUS  INTERMEDIUS         Radiology Studies: Dg Chest Port 1 View  Result Date: 10/11/2017 CLINICAL DATA:  Follow-up right pneumothorax with pigtail chest tube in place. Follow-up bilateral pleural effusions. EXAM: PORTABLE CHEST 1 VIEW COMPARISON:  10/10/2017, 10/09/2017 and earlier, including CT chest 10/03/2017. FINDINGS: Persistent right lateral pneumothorax, though slightly improved since yesterday. Persistent large right pleural effusion, unchanged, with pigtail catheter in place. Small left pleural effusion, unchanged. Consolidation in the right middle lobe and right lower lobe, unchanged. Mild atelectasis at the left lung base, unchanged. No new pulmonary parenchymal abnormalities. Cardiac silhouette markedly enlarged, unchanged. Left subclavian dual lead pacing defibrillator unchanged. IMPRESSION: 1. Persistent right lateral pneumothorax, slightly improved since yesterday. 2. Stable large right pleural effusion and small left pleural effusion. 3. Stable atelectasis and/or pneumonia involving the right middle lobe and right lower lobe. Stable mild left basilar atelectasis. 4. No new abnormalities. Electronically Signed   By: Evangeline Dakin M.D.   On: 10/11/2017 09:40   Dg Chest Port 1 View  Result Date: 10/10/2017 CLINICAL DATA:  Acute chest pain. EXAM: PORTABLE CHEST 1 VIEW COMPARISON:  10/10/2017 and prior radiographs FINDINGS: Cardiomediastinal silhouette is unchanged. A left AICD/pacemaker again noted. A right pleural catheter/thoracostomy tube is again noted. Pleural air replaces the majority of the pleural fluid identified along the right lower lung which may represent pneumothorax ex vacuo. Continued right mid and lower lung consolidation/atelectasis noted. No other changes are identified. IMPRESSION: Moderate right basilar pneumothorax, with air replacing pleural fluid, and may represent pneumothorax ex vacuo. Unchanged right thoracostomy tube. Continued right mid and lower lung  atelectasis/consolidation. Electronically Signed   By: Margarette Canada M.D.   On: 10/10/2017 18:23   Dg Chest Port 1 View  Result Date: 10/10/2017 CLINICAL DATA:  Pleural effusion on the right EXAM: PORTABLE CHEST 1 VIEW COMPARISON:  Yesterday FINDINGS: Pigtail pleural catheter on the right with smaller right pleural effusion. There is likely pneumothorax loculated at the right base. Trace left effusion. Chronic cardiomegaly. Dual-chamber ICD/pacer from the left. No Kerley lines. IMPRESSION: Decreasing right pleural effusion. Possible small loculated pneumothorax at the right base. Electronically Signed   By: Monte Fantasia M.D.   On: 10/10/2017 07:15        Scheduled Meds: . carvedilol  3.125 mg Oral BID WC  . furosemide  40 mg Oral Daily  . mouth rinse  15 mL Mouth Rinse BID  . oxybutynin  5 mg Oral BID  . sertraline  50 mg Oral QHS   Continuous Infusions: . ampicillin-sulbactam (UNASYN) IV 3 g (10/11/17 1313)     LOS:  11 days        Georgette Shell, MD [facility]  If 7PM-7AM, please contact night-coverage [email or web address] Password [facility] 10/11/2017, 3:25 PM

## 2017-10-11 NOTE — Progress Notes (Signed)
Referring Physician(s): Byrum,R  Supervising Physician: Aletta Edouard  Patient Status:  [facility] - In-pt  Chief Complaint:  Loculated right pleural effusion/empyema  Subjective: Pt on portable commode; states breathing is no worse;has chronic dyspnea, no worsening CP or hemoptysis; occ cough   Allergies: Lisinopril  Medications: Prior to Admission medications   Medication Sig Start Date End Date Taking? Authorizing Provider  albuterol (PROVENTIL HFA;VENTOLIN HFA) 108 (90 Base) MCG/ACT inhaler Inhale 2 puffs into the lungs every 6 (six) hours as needed for wheezing or shortness of breath. 01/14/17  Yes Rizwan, Eunice Blase, MD  allopurinol (ZYLOPRIM) 100 MG tablet TAKE 1 TABLET ONCE DAILY. 09/16/17  Yes Noralee Space, MD  carvedilol (COREG) 3.125 MG tablet TAKE 1 TABLET TWICE DAILY WITH A MEAL. 06/10/17  Yes Minus Breeding, MD  cholecalciferol (VITAMIN D) 1000 units tablet Take 1,000 Units by mouth daily.   Yes [provider]  clopidogrel (PLAVIX) 75 MG tablet TAKE 1 TABLET ONCE DAILY. 06/25/17  Yes Noralee Space, MD  Fluticasone-Salmeterol [facility] RESPICLICK 841/32) 440-10 MCG/ACT AEPB Inhale 2 puffs into the lungs 2 (two) times daily. 09/18/17  Yes Noralee Space, MD  furosemide (LASIX) 40 MG tablet TAKE 1 TABLET DAILY AS NEEDED. (FOR WEIGHT GAIN) Patient taking differently: Take one tablet by mouth every other day. 06/25/17  Yes Noralee Space, MD  guaiFENesin (MUCINEX) 600 MG 12 hr tablet Take 600-1,200 mg by mouth 2 (two) times daily. Takes two in the morning and one in the evening.   Yes [provider]  levothyroxine (SYNTHROID, LEVOTHROID) 25 MCG tablet TAKE 1 TABLET IN THE MORNING ON AN EMPTY STOMACH. 07/08/17  Yes Noralee Space, MD  magnesium oxide (MAG-OX) 400 (241.3 Mg) MG tablet Take 0.5 tablets (200 mg total) by mouth daily. 04/27/16  Yes Love, Ivan Anchors, PA-C  Multiple Vitamin (MULTIVITAMIN) capsule Take 1 capsule by mouth daily.   Yes [provider]    Multiple Vitamins-Minerals (PRESERVISION/LUTEIN) CAPS Take 1 capsule by mouth 2 (two) times daily.    Yes [provider]  oxybutynin (DITROPAN) 5 MG tablet TAKE 1 TABLET BY MOUTH TWICE DAILY. 09/02/17  Yes Noralee Space, MD  polyethylene glycol powder (GLYCOLAX/MIRALAX) powder DISSOLVE ONE CAPFUL IN 8 0Z. WATER TWICE DAILY. 07/23/17  Yes Noralee Space, MD  potassium chloride SA (K-DUR,KLOR-CON) 20 MEQ tablet TAKE 1 TABLET DAILY AS NEEDED. (TAKE EACH TIME YOU TAKE FUROSEMIDE) Patient taking differently: Take one tablet by mouth every other day. 04/29/17  Yes Noralee Space, MD  pravastatin (PRAVACHOL) 40 MG tablet TAKE 1 TABLET ONCE DAILY. 08/13/17  Yes Noralee Space, MD  sertraline (ZOLOFT) 50 MG tablet TAKE ONE TABLET AT BEDTIME. 09/02/17  Yes Noralee Space, MD  thiamine 100 MG tablet Take 1 tablet (100 mg total) by mouth daily. 04/26/16  Yes Love, Ivan Anchors, PA-C  ADVAIR DISKUS 250-50 MCG/DOSE AEPB Inhale 1 puff into the lungs 2 (two) times daily. 07/23/17   [provider]  alendronate (FOSAMAX) 70 MG tablet Take 1 tablet (70 mg total) by mouth once a week. Take with a full glass of water on an empty stomach. 07/10/17   Noralee Space, MD  amiodarone (PACERONE) 200 MG tablet TAKE 1 TABLET ONCE DAILY. 10/02/17   Noralee Space, MD  clotrimazole-betamethasone (LOTRISONE) cream Apply 1 application topically 2 (two) times daily. Patient not taking: Reported on 09/30/2017 06/18/17   Noralee Space, MD     Vital Signs: BP Marland Kitchen)  97/49 (BP Location: Left Arm)   Pulse 61   Temp (!) 97.5 F (36.4 C) (Oral)   Resp 20   Ht 5\' 11"  (1.803 m)   Wt 167 lb 12.3 oz (76.1 kg)   SpO2 94%   BMI 23.40 kg/m   Physical Exam rt chest drain intact, insertion site ok, all connections secure; no visible air leak on pleuravac; output 430 cc blood-tinged fluid; cx- strept  Imaging: Dg Chest 2 View  Result Date: 10/09/2017 CLINICAL DATA:  Up of right pleural effusion EXAM: CHEST  2 VIEW  COMPARISON:  Fluoro spot radiographs of October 08, 2017 and chest x-ray of the same day. FINDINGS: The patient has undergone placement of a pigtail catheter in the right pleural space for drainage of the pleural effusion. There is no pneumothorax. The volume of pleural fluid does not appear appreciably changed. There is a soft tissue mass-like density in the right hilar region the cardiac silhouette is enlarged. The pulmonary vascularity is normal. The ICD is in stable position. There is dense calcification in the wall of the thoracic aorta. The bony thorax exhibits no acute abnormality. IMPRESSION: Persistent moderate-sized right pleural effusion. A pigtail catheter is present now in the right pleural space. No postplacement complication. Persistent soft tissue prominence in the right perihilar region. Stable cardiomegaly without pulmonary edema. Thoracic aortic atherosclerosis. Electronically Signed   By: David  Martinique M.D.   On: 10/09/2017 09:52   Dg Chest Port 1 View  Result Date: 10/11/2017 CLINICAL DATA:  Follow-up right pneumothorax with pigtail chest tube in place. Follow-up bilateral pleural effusions. EXAM: PORTABLE CHEST 1 VIEW COMPARISON:  10/10/2017, 10/09/2017 and earlier, including CT chest 10/03/2017. FINDINGS: Persistent right lateral pneumothorax, though slightly improved since yesterday. Persistent large right pleural effusion, unchanged, with pigtail catheter in place. Small left pleural effusion, unchanged. Consolidation in the right middle lobe and right lower lobe, unchanged. Mild atelectasis at the left lung base, unchanged. No new pulmonary parenchymal abnormalities. Cardiac silhouette markedly enlarged, unchanged. Left subclavian dual lead pacing defibrillator unchanged. IMPRESSION: 1. Persistent right lateral pneumothorax, slightly improved since yesterday. 2. Stable large right pleural effusion and small left pleural effusion. 3. Stable atelectasis and/or pneumonia involving the  right middle lobe and right lower lobe. Stable mild left basilar atelectasis. 4. No new abnormalities. Electronically Signed   By: Evangeline Dakin M.D.   On: 10/11/2017 09:40   Dg Chest Port 1 View  Result Date: 10/10/2017 CLINICAL DATA:  Acute chest pain. EXAM: PORTABLE CHEST 1 VIEW COMPARISON:  10/10/2017 and prior radiographs FINDINGS: Cardiomediastinal silhouette is unchanged. A left AICD/pacemaker again noted. A right pleural catheter/thoracostomy tube is again noted. Pleural air replaces the majority of the pleural fluid identified along the right lower lung which may represent pneumothorax ex vacuo. Continued right mid and lower lung consolidation/atelectasis noted. No other changes are identified. IMPRESSION: Moderate right basilar pneumothorax, with air replacing pleural fluid, and may represent pneumothorax ex vacuo. Unchanged right thoracostomy tube. Continued right mid and lower lung atelectasis/consolidation. Electronically Signed   By: Margarette Canada M.D.   On: 10/10/2017 18:23   Dg Chest Port 1 View  Result Date: 10/10/2017 CLINICAL DATA:  Pleural effusion on the right EXAM: PORTABLE CHEST 1 VIEW COMPARISON:  Yesterday FINDINGS: Pigtail pleural catheter on the right with smaller right pleural effusion. There is likely pneumothorax loculated at the right base. Trace left effusion. Chronic cardiomegaly. Dual-chamber ICD/pacer from the left. No Kerley lines. IMPRESSION: Decreasing right pleural effusion. Possible small loculated pneumothorax  at the right base. Electronically Signed   By: Monte Fantasia M.D.   On: 10/10/2017 07:15   Dg Chest Port 1 View  Result Date: 10/08/2017 CLINICAL DATA:  Pleural effusion. EXAM: PORTABLE CHEST 1 VIEW COMPARISON:  Radiographs of October 07, 2017. FINDINGS: Stable cardiomegaly. Atherosclerosis of thoracic aorta is noted. Left-sided pacemaker is unchanged in position. No pneumothorax is noted. Stable large right pleural effusion is noted with associated  atelectasis or infiltrate. Bony thorax is unremarkable. IMPRESSION: Aortic atherosclerosis. Stable large right pleural effusion is noted with associated atelectasis or infiltrate. Electronically Signed   By: Marijo Conception, M.D.   On: 10/08/2017 08:24   Ir Image Guided Drainage By Percutaneous Catheter  Result Date: 10/08/2017 INDICATION: 83 year old with a loculated right pleural effusion. Request for chest tube placement. EXAM: IMAGE GUIDED PLACEMENT OF RIGHT CHEST TUBE MEDICATIONS: Sedation medications ANESTHESIA/SEDATION: Fentanyl 25 mcg IV; Versed 1.0 mg IV Moderate Sedation Time:  15 minutes The patient was continuously monitored during the procedure by the interventional radiology nurse under my direct supervision. FLUOROSCOPY TIME:  24 seconds, 8 mGy COMPLICATIONS: None immediate. PROCEDURE: Informed written consent was obtained from the patient after a thorough discussion of the procedural risks, benefits and alternatives. All questions were addressed. Maximal Sterile Barrier Technique was utilized including caps, mask, sterile gowns, sterile gloves, sterile drape, hand hygiene and skin antiseptic. A timeout was performed prior to the initiation of the procedure. Right side of the chest was prepped and draped in a sterile fashion. Ultrasound was used to identify the loculated right pleural effusion. Skin was anesthetized with 1% lidocaine. Yueh catheter was directed into a loculated collection with ultrasound guidance. Initially, no fluid could be aspirated. A second collection was targeted with ultrasound. A small amount of yellow fluid was aspirated. A stiff Amplatz wire was advanced into the pleural space. Tract was dilated to accommodate a 12 [place] multipurpose drain. Approximately 20 mL of brown purulent fluid was aspirated. Catheter was sutured to skin and attached to a Pleur-Evac. Fluoroscopic and ultrasound images were taken and saved for documentation. FINDINGS: Right pleural collection is  very loculated. Approximately 20 mL of purulent fluid was removed. Findings are compatible with an empyema. IMPRESSION: Placement of right chest tube using ultrasound and fluoroscopic guidance. Purulent fluid was removed from the right pleural space. Findings are compatible with an empyema. Electronically Signed   By: Markus Daft M.D.   On: 10/08/2017 17:46    Labs:  CBC: Recent Labs    10/08/17 0423 10/09/17 0419 10/10/17 0419 10/11/17 0447  WBC 11.6* 12.5* 12.2* 15.6*  HGB 11.7* 12.5* 12.4* 12.2*  HCT 34.3* 36.9* 36.5* 35.6*  PLT 296 307 271 239    COAGS: Recent Labs    10/08/17 1040  INR 1.15    BMP: Recent Labs    10/08/17 0423 10/09/17 0419 10/10/17 0419 10/11/17 0447  NA 136 134* 135 134*  K 3.6 3.7 3.6 3.2*  CL 98* 95* 96* 97*  CO2 29 31 32 30  GLUCOSE 102* 101* 133* 92  BUN 18 16 15 15   CALCIUM 8.0* 8.0* 8.1* 7.9*  CREATININE 0.78 0.92 1.03 0.95  GFRNONAA >60 >60 >60 >60  GFRAA >60 >60 >60 >60    LIVER FUNCTION TESTS: Recent Labs    10/08/17 0423 10/09/17 0419 10/10/17 0419 10/11/17 0447  BILITOT 0.5 0.9 0.7 0.8  AST 81* 72* 66* 40  ALT 82* 76* 67* 50  ALKPHOS 62 67 63 58  PROT 5.5* 6.0* 5.8*  5.7*  ALBUMIN 1.9* 1.9* 1.8* 1.8*    Assessment and Plan: Pt s/p drainage of loculated rt pleural effusion/empyema; afebrile; BP a little soft; fluid cx- strept intermedius; WBC 15.6(12.2), hgb 12.2, creat nl; K 3.2- replace; CXR today-persistent rt lat ptx, sl improved; stable effusions; TPA recently given by CCM thru tube; plans as per CCM/pulm; consider f/u CT chest before removing drain     Electronically Signed: D. Rowe Robert, PA-C 10/11/2017, 1:44 PM   I spent a total of 15 minutes at the the patient's bedside AND on the patient's hospital floor or unit, greater than 50% of which was counseling/coordinating care for right chest drain    Patient ID: [name], male   DOB: 14-Oct-1931, 83 y.o.   MRN: [id number]

## 2017-10-11 NOTE — Progress Notes (Signed)
Physical Therapy Treatment Patient Details Name: [name] MRN: [id number] DOB: 1931/09/02 Today's Date: 10/11/2017    History of Present Illness 83 y.o. 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 He ambulated 4' with RW, distance limited by fatigue. SaO2 97% on 3L O2 with activity.   Follow Up Recommendations  SNF;Supervision/Assistance - 24 hour     Equipment Recommendations  Rolling walker with 5" wheels    Recommendations for Other Services       Precautions / Restrictions Precautions Precautions: Fall;Other (comment) Precaution Comments: chest tube, h/o falls in past year Restrictions Weight Bearing Restrictions: No    Mobility  Bed Mobility Overal bed mobility: Modified Independent Bed Mobility: Supine to Sit     Supine to sit: Modified independent (Device/Increase time)     General bed mobility comments: HOB up 25*, used rail  Transfers Overall transfer level: Needs assistance Equipment used: Rolling walker (2 wheeled) Transfers: Sit to/from Stand Sit to Stand: From elevated surface;Min guard         General transfer comment: min/guard for safety, VCs hand placement  Ambulation/Gait Ambulation/Gait assistance: Min assist;Min guard Ambulation Distance (Feet): 4 Feet Assistive device: Rolling walker (2 wheeled) Gait Pattern/deviations: Step-to pattern     General Gait Details: several pivotal steps from bed to recliner, min A for balance 2* posterior lean, performed standing LE exercises with RW (see exercise section), SaO2 97% on 3L O2 during activity, pt stated he was too fatigued to  ambulate further   Stairs            Wheelchair Mobility    Modified Rankin (Stroke Patients Only)       Balance Overall balance assessment: Needs assistance Sitting-balance support: Feet supported Sitting balance-Leahy Scale: Good   Postural control: Posterior lean Standing balance support: Bilateral upper extremity supported Standing balance-Leahy Scale: Poor Standing balance comment: requires BUE support                            Cognition Arousal/Alertness: Awake/alert Behavior During Therapy: WFL for tasks assessed/performed Overall Cognitive Status: Within Functional Limits for tasks assessed                                        Exercises General Exercises - Lower Extremity Long Arc Quad: AROM;Both;10 reps;Seated Hip Flexion/Marching: AROM;10 reps;Both;Standing Heel Raises: AROM;Both;5 reps;Standing Mini-Sqauts: AROM;5 reps;Standing;Both    General Comments        Pertinent Vitals/Pain Pain Assessment: No/denies pain    Home Living                      Prior Function            PT Goals (current goals can now be found in the care plan section) Acute Rehab PT Goals PT Goal Formulation: With patient/family Time For Goal Achievement: 10/17/17 Potential to Achieve Goals: Good Progress towards PT goals: Progressing toward goals    Frequency    Min 3X/week      PT Plan Current plan remains appropriate  Co-evaluation              AM-PAC PT "6 Clicks" Daily Activity  Outcome Measure  Difficulty turning over in bed (including adjusting bedclothes, sheets and blankets)?: None Difficulty moving from lying on back to sitting on the side of the bed? : A Little Difficulty sitting down on and standing up from a chair with arms (e.g., wheelchair, bedside commode, etc,.)?: A Little Help needed moving to and from a bed to chair (including a wheelchair)?: A Little Help needed walking in hospital room?:  A Lot Help needed climbing 3-5 steps with a railing? : Total 6 Click Score: 16    End of Session Equipment Utilized During Treatment: Gait belt Activity Tolerance: Patient tolerated treatment well Patient left: in chair;with call bell/phone within reach;with family/visitor present Nurse Communication: Mobility status PT Visit Diagnosis: Difficulty in walking, not elsewhere classified (R26.2)     Time: 3559-7416 PT Time Calculation (min) (ACUTE ONLY): 23 min  Charges:  $Gait Training: 8-22 mins $Therapeutic Exercise: 8-22 mins                    G Codes:          Philomena Doheny 10/11/2017, 10:04 AM [phone]

## 2017-10-11 NOTE — Progress Notes (Signed)
   10/11/17 1500  Clinical Encounter Type  Visited With Patient;Health care provider  Visit Type Follow-up  Spiritual Encounters  Spiritual Needs Emotional   Rounding on Palliative patients. Patient remembered me from previous visits.  In asking how he was doing today he said he would rather be anywhere else.  He has spoken about his dog and wanting to get home.  I said, don't give up hope, stay strong and he looked and said I wont give up hope.  Will follow as needed. Chaplain Katherene Ponto

## 2017-10-12 LAB — COMPREHENSIVE METABOLIC PANEL
ALBUMIN: 1.7 g/dL — AB (ref 3.5–5.0)
ALT: 38 U/L (ref 17–63)
ANION GAP: 7 (ref 5–15)
AST: 33 U/L (ref 15–41)
Alkaline Phosphatase: 54 U/L (ref 38–126)
BILIRUBIN TOTAL: 0.8 mg/dL (ref 0.3–1.2)
BUN: 15 mg/dL (ref 6–20)
CHLORIDE: 96 mmol/L — AB (ref 101–111)
CO2: 30 mmol/L (ref 22–32)
Calcium: 7.7 mg/dL — ABNORMAL LOW (ref 8.9–10.3)
Creatinine, Ser: 0.91 mg/dL (ref 0.61–1.24)
GFR calc Af Amer: >60 mL/min (ref >60)
GLUCOSE: 102 mg/dL — AB (ref 65–99)
POTASSIUM: 2.8 mmol/L — AB (ref 3.5–5.1)
Sodium: 133 mmol/L — ABNORMAL LOW (ref 135–145)
TOTAL PROTEIN: 5.2 g/dL — AB (ref 6.5–8.1)

## 2017-10-12 LAB — CBC
HEMATOCRIT: 34 % — AB (ref 39.0–52.0)
Hemoglobin: 11.6 g/dL — ABNORMAL LOW (ref 13.0–17.0)
MCH: 32.7 pg (ref 26.0–34.0)
MCHC: 34.1 g/dL (ref 30.0–36.0)
MCV: 95.8 fL (ref 78.0–100.0)
PLATELETS: 244 10*3/uL (ref 150–400)
RBC: 3.55 MIL/uL — ABNORMAL LOW (ref 4.22–5.81)
RDW: 12.9 % (ref 11.5–15.5)
WBC: 12.9 10*3/uL — ABNORMAL HIGH (ref 4.0–10.5)

## 2017-10-12 MED ORDER — POTASSIUM CHLORIDE CRYS ER 20 MEQ PO TBCR: 20.0000 meq | EXTENDED_RELEASE_TABLET | Freq: Every day | ORAL | Status: DC

## 2017-10-12 MED ORDER — FUROSEMIDE 20 MG PO TABS
20.0000 mg | ORAL_TABLET | Freq: Every day | ORAL | Status: DC
  Administered 2017-10-13 – 2017-10-17 (×5): 20 mg via ORAL
  Filled 2017-10-12 (×6): qty 1

## 2017-10-12 MED ORDER — POTASSIUM CHLORIDE CRYS ER 20 MEQ PO TBCR
40.0000 meq | EXTENDED_RELEASE_TABLET | Freq: Three times a day (TID) | ORAL | Status: DC
  Administered 2017-10-12 (×2): 40 meq via ORAL
  Filled 2017-10-12 (×2): qty 2

## 2017-10-12 NOTE — Progress Notes (Addendum)
PROGRESS NOTE    [name]  QPY:[id number] DOB: [birth year] DOA: 09/30/2017 PCP: Noralee Space, MD  Brief Narrative:83 y.o.malewith medical history significant ofchronic combined diastolic and systolic congestive heart failure, CAD, hypertension, hyperlipidemia, vascular disease who presentedto the emergency department with complaints of increased cough and congestion started 2 weeks prior to hospital admission. Chest x-ray demonstrated a right lower lobe pneumonia and right pleural effusion. He did not improve withantibiotics and diuresis.CT chest demonstrated a moderate right-sided pleural effusion without loculation. Heunderwent thoracentesis on 12/14. The fluid was exudative and loculated. Pulmonology and palliative care have been consulted. His antibiotics have been changed to those that can cover anaerobes as he is aspirating.Despite an improvement in his leukocytosis, his chest x-ray continues to look worse. He underwent pigtail catheter placement on 12/18 by IR and pulmonology is discussing possible DNase or lytics if effusion doesn't drain well.  12/21-no specific complaints  12/22-RESTING IN NAD.  Assessment & Plan:   Principal Problem:   Acute hypoxemic respiratory failure (HCC) Active Problems:   Essential hypertension   Carotid arterial disease (HCC)   CAP (community acquired pneumonia)   Cardiomyopathy, ischemic   Subdural hematoma (HCC)   Traumatic subdural hematoma with loss of consciousness (HCC)   Pulmonary hypertension (HCC)   TBI (traumatic brain injury) ([place])   Thrombocytopenia ([place])   Hypertensive heart disease with heart failure (HCC)   Chronic combined systolic and diastolic CHF (congestive heart failure) (HCC)   COPD with acute bronchitis (HCC)   CHF exacerbation (HCC)   Pressure injury of skin   Pleural effusion  Acute hypoxemic respiratory failure secondary to pleural effusion and aspiration pneumonia-patient has had lytics and Pulmozyme  x2 to assist with draining the effusion.  Pleural fluid cultures grew strep viridans sensitive to Unasyn.  However he has loculated pneumothorax with no expansion of his right lower lobe.  VATS procedure would be risky at this time.  Discussed with Dr. Lamonte Sakai.  Chest tube to be DC'd once the drainage becomes less than 50 cc/day.  Today drainage has been around 50 cc since the last 24 hours a follow-up chest x-ray will be ordered for tomorrow morning. 1. Continue Unasyn 2. Pulmonologyand IR assistanceappreciated 3. Pigtail 12/18 2. HTN, mildly hypotensiON-Carvedilol stopped 1221.  Patient remains on Lasix 40 mg daily.  Decrease a dose of Lasix to 20 mg daily patient appears more dry and running low blood pressure. 3. Dysphagia 1. Aspiration precautions 2. Diet advanced by speech therapy today to dysphagia 3 3. Appreciate speech therapy assistance 4. Patient declines feeding tube 4. Acute on chronic combined sys and dias chf exacerbation 1. Most recent 2d echo with EF 30-35% 2. Presenting BNP of 1348, was 496 on 01/22/17 3. Continue oral Lasix 5. COPD 1. No wheezing at this time 6. Hx subdural hematoma 1. Stable at this time 7 hypokalemia replete and recheck levels.    DVT prophylaxis: Lovenox Code Status: Full code Family Communication discussed with daughter she would like to have rehab MD evaluate her prior to discharge.family would really like for him to go to: Rehab.  Patient was in [facility] in the past.  Discussed with patient's daughter on the phone.  Patient has 2 daughters and 1 son.   :Disposition Plan: I have put in a consult to [facility] rehab MD evaluation as patient's family requested for him to the go to CIR.  When he is discharged he will have palliative care follow-up. consultants:  P CCM and palliative care Procedures: Chest  tube right Antimicrobials: Unasyn Subjective: Sleeping easily arousable denies new complaints denies pain   Objective: In no acute distress Vitals:     10/11/17 1317 10/11/17 2048 10/12/17 0442 10/12/17 0447  BP: (!) 97/49 (!) 118/47 (!) 106/44   Pulse: 61 61 62   Resp: 20 20 12    Temp: (!) 97.5 F (36.4 C) 98.4 F (36.9 C) 98.1 F (36.7 C)   TempSrc: Oral Oral Oral   SpO2: 94% 99% 96%   Weight:    76.5 kg (168 lb 10.4 oz)  Height:        Intake/Output Summary (Last 24 hours) at 10/12/2017 1302 Last data filed at 10/12/2017 0600 Gross per 24 hour  Intake 200 ml  Output 1260 ml  Net -1060 ml   Filed Weights   10/10/17 0500 10/11/17 0538 10/12/17 0447  Weight: 75.3 kg (166 lb 0.1 oz) 76.1 kg (167 lb 12.3 oz) 76.5 kg (168 lb 10.4 oz)    Examination:  General exam: Appears calm and comfortable  Respiratory system:scattered rhonchi decresed breath sounds.auscultation. Respiratory effort normal. Cardiovascular system: S1 & S2 heard, RRR. No JVD, murmurs, rubs, gallops or clicks. No pedal edema. Gastrointestinal system: Abdomen is nondistended, soft and nontender. No organomegaly or masses felt. Normal bowel sounds heard. Central nervous system: Alert and oriented. No focal neurological deficits. Extremities: Symmetric 5 x 5 power. Skin: No rashes, lesions or ulcers Psychiatry: Judgement and insight appear normal. Mood & affect appropriate.     Data Reviewed: I have personally reviewed following labs and imaging studies  CBC: Recent Labs  Lab 10/08/17 0423 10/09/17 0419 10/10/17 0419 10/11/17 0447 10/12/17 0535  WBC 11.6* 12.5* 12.2* 15.6* 12.9*  HGB 11.7* 12.5* 12.4* 12.2* 11.6*  HCT 34.3* 36.9* 36.5* 35.6* 34.0*  MCV 97.7 98.1 98.4 96.5 95.8  PLT 296 307 271 239 272   Basic Metabolic Panel: Recent Labs  Lab 10/08/17 0423 10/09/17 0419 10/10/17 0419 10/11/17 0447 10/12/17 0535  NA 136 134* 135 134* 133*  K 3.6 3.7 3.6 3.2* 2.8*  CL 98* 95* 96* 97* 96*  CO2 29 31 32 30 30  GLUCOSE 102* 101* 133* 92 102*  BUN 18 16 15 15 15   CREATININE 0.78 0.92 1.03 0.95 0.91  CALCIUM 8.0* 8.0* 8.1* 7.9* 7.7*    GFR: Estimated Creatinine Clearance: 64.4 mL/min (by C-G formula based on SCr of 0.91 mg/dL). Liver Function Tests: Recent Labs  Lab 10/08/17 0423 10/09/17 0419 10/10/17 0419 10/11/17 0447 10/12/17 0535  AST 81* 72* 66* 40 33  ALT 82* 76* 67* 50 38  ALKPHOS 62 67 63 58 54  BILITOT 0.5 0.9 0.7 0.8 0.8  PROT 5.5* 6.0* 5.8* 5.7* 5.2*  ALBUMIN 1.9* 1.9* 1.8* 1.8* 1.7*   No results for input(s): LIPASE, AMYLASE in the last 168 hours. No results for input(s): AMMONIA in the last 168 hours. Coagulation Profile: Recent Labs  Lab 10/08/17 1040  INR 1.15   Cardiac Enzymes: Recent Labs  Lab 10/06/17 0841  CKTOTAL 16*   BNP (last 3 results) Recent Labs    01/22/17 1231  PROBNP 496.0*   HbA1C: No results for input(s): HGBA1C in the last 72 hours. CBG: No results for input(s): GLUCAP in the last 168 hours. Lipid Profile: No results for input(s): CHOL, HDL, LDLCALC, TRIG, CHOLHDL, LDLDIRECT in the last 72 hours. Thyroid Function Tests: No results for input(s): TSH, T4TOTAL, FREET4, T3FREE, THYROIDAB in the last 72 hours. Anemia Panel: No results for input(s): VITAMINB12, FOLATE, FERRITIN, TIBC,  IRON, RETICCTPCT in the last 72 hours. Sepsis Labs: No results for input(s): PROCALCITON, LATICACIDVEN in the last 168 hours.  Recent Results (from the past 240 hour(s))  Body fluid culture     Status: None   Collection Time: 10/04/17  4:40 PM  Result Value Ref Range Status   Specimen Description PLEURAL  Final   Special Requests NONE  Final   Gram Stain   Final    ABUNDANT WBC PRESENT,BOTH PMN AND MONONUCLEAR NO ORGANISMS SEEN    Culture   Final    NO GROWTH 3 DAYS Performed at [facility], [address], [place], [place] [address]    Report Status 10/08/2017 FINAL  Final  Body fluid culture     Status: None   Collection Time: 10/08/17  1:03 PM  Result Value Ref Range Status   Specimen Description PLEURAL RIGHT  Final   Special Requests NONE  Final   Gram  Stain   Final    RARE WBC PRESENT, PREDOMINANTLY PMN FEW GRAM POSITIVE COCCI IN PAIRS IN CHAINS RARE GRAM VARIABLE ROD Performed at [facility], [place] [address], [place], [place] [address]    Culture FEW STREPTOCOCCUS INTERMEDIUS  Final   Report Status 10/11/2017 FINAL  Final   Organism ID, Bacteria STREPTOCOCCUS INTERMEDIUS  Final      Susceptibility   Streptococcus intermedius - MIC*    PENICILLIN 0.12 SENSITIVE Sensitive     CEFTRIAXONE 0.25 SENSITIVE Sensitive     ERYTHROMYCIN <=0.12 SENSITIVE Sensitive     LEVOFLOXACIN 0.5 SENSITIVE Sensitive     VANCOMYCIN 0.5 SENSITIVE Sensitive     AMPICILLIN Value in next row Sensitive      SENSITIVE<=0.25    * FEW STREPTOCOCCUS INTERMEDIUS         Radiology Studies: Dg Chest Port 1 View  Result Date: 10/11/2017 CLINICAL DATA:  Follow-up right pneumothorax with pigtail chest tube in place. Follow-up bilateral pleural effusions. EXAM: PORTABLE CHEST 1 VIEW COMPARISON:  10/10/2017, 10/09/2017 and earlier, including CT chest 10/03/2017. FINDINGS: Persistent right lateral pneumothorax, though slightly improved since yesterday. Persistent large right pleural effusion, unchanged, with pigtail catheter in place. Small left pleural effusion, unchanged. Consolidation in the right middle lobe and right lower lobe, unchanged. Mild atelectasis at the left lung base, unchanged. No new pulmonary parenchymal abnormalities. Cardiac silhouette markedly enlarged, unchanged. Left subclavian dual lead pacing defibrillator unchanged. IMPRESSION: 1. Persistent right lateral pneumothorax, slightly improved since yesterday. 2. Stable large right pleural effusion and small left pleural effusion. 3. Stable atelectasis and/or pneumonia involving the right middle lobe and right lower lobe. Stable mild left basilar atelectasis. 4. No new abnormalities. Electronically Signed   By: Evangeline Dakin M.D.   On: 10/11/2017 09:40   Dg Chest Port 1 View  Result Date:  10/10/2017 CLINICAL DATA:  Acute chest pain. EXAM: PORTABLE CHEST 1 VIEW COMPARISON:  10/10/2017 and prior radiographs FINDINGS: Cardiomediastinal silhouette is unchanged. A left AICD/pacemaker again noted. A right pleural catheter/thoracostomy tube is again noted. Pleural air replaces the majority of the pleural fluid identified along the right lower lung which may represent pneumothorax ex vacuo. Continued right mid and lower lung consolidation/atelectasis noted. No other changes are identified. IMPRESSION: Moderate right basilar pneumothorax, with air replacing pleural fluid, and may represent pneumothorax ex vacuo. Unchanged right thoracostomy tube. Continued right mid and lower lung atelectasis/consolidation. Electronically Signed   By: Margarette Canada M.D.   On: 10/10/2017 18:23  Scheduled Meds: . furosemide  40 mg Oral Daily  . mouth rinse  15 mL Mouth Rinse BID  . oxybutynin  5 mg Oral BID  . potassium chloride  40 mEq Oral TID  . sertraline  50 mg Oral QHS   Continuous Infusions: . ampicillin-sulbactam (UNASYN) IV Stopped (10/12/17 0630)     LOS: 12 days      Georgette Shell, MD [facility]  If 7PM-7AM, please contact night-coverage [email or web address] Password [email or web address] 10/12/2017, 1:02 PM

## 2017-10-13 ENCOUNTER — Inpatient Hospital Stay (HOSPITAL_COMMUNITY): Payer: Medicare Other

## 2017-10-13 LAB — CBC
HEMATOCRIT: 33.1 % — AB (ref 39.0–52.0)
HEMOGLOBIN: 11.3 g/dL — AB (ref 13.0–17.0)
MCH: 32.8 pg (ref 26.0–34.0)
MCHC: 34.1 g/dL (ref 30.0–36.0)
MCV: 96.2 fL (ref 78.0–100.0)
Platelets: 246 10*3/uL (ref 150–400)
RBC: 3.44 MIL/uL — AB (ref 4.22–5.81)
RDW: 13.3 % (ref 11.5–15.5)
WBC: 13.7 10*3/uL — ABNORMAL HIGH (ref 4.0–10.5)

## 2017-10-13 LAB — COMPREHENSIVE METABOLIC PANEL
ALT: 38 U/L (ref 17–63)
ANION GAP: 7 (ref 5–15)
AST: 37 U/L (ref 15–41)
Albumin: 1.7 g/dL — ABNORMAL LOW (ref 3.5–5.0)
Alkaline Phosphatase: 53 U/L (ref 38–126)
BILIRUBIN TOTAL: 0.4 mg/dL (ref 0.3–1.2)
BUN: 13 mg/dL (ref 6–20)
CO2: 29 mmol/L (ref 22–32)
Calcium: 7.7 mg/dL — ABNORMAL LOW (ref 8.9–10.3)
Chloride: 99 mmol/L — ABNORMAL LOW (ref 101–111)
Creatinine, Ser: 0.87 mg/dL (ref 0.61–1.24)
GFR calc non Af Amer: >60 mL/min (ref >60)
GLUCOSE: 96 mg/dL (ref 65–99)
POTASSIUM: 3.6 mmol/L (ref 3.5–5.1)
Sodium: 135 mmol/L (ref 135–145)
TOTAL PROTEIN: 5.5 g/dL — AB (ref 6.5–8.1)

## 2017-10-13 MED ORDER — POTASSIUM CHLORIDE CRYS ER 20 MEQ PO TBCR
40.0000 meq | EXTENDED_RELEASE_TABLET | Freq: Every day | ORAL | Status: AC
  Administered 2017-10-13 – 2017-10-15 (×3): 40 meq via ORAL
  Filled 2017-10-13 (×3): qty 2

## 2017-10-13 NOTE — Progress Notes (Signed)
Patient ID: [name], male   DOB: May 20, 1931, 83 y.o.   MRN: [id number]  PROGRESS NOTE    [name]  LGX:[id number] DOB: Aug 08, 1931 DOA: 09/30/2017  PCP: Noralee Space, MD   Brief Narrative:  83 y.o.malewith medical history significant ofchronic combined diastolic and systolic congestive heart failure, CAD, hypertension, hyperlipidemia, vascular disease who presentedto the emergency department with complaints of increased cough and congestion started 2 weeks prior to hospital admission. Chest x-ray demonstrated a right lower lobe pneumonia and right pleural effusion. He did not improve withantibiotics and diuresis.CT chest demonstrated a moderate right-sided pleural effusion without loculation. Heunderwent thoracentesis on 12/14. The fluid was exudative and loculated. Pulmonology and palliative care have been consulted. His antibiotics have been changed to those that can cover anaerobes as he is aspirating.Despite an improvement in his leukocytosis, his chest x-ray continues to look worse. He underwent pigtail catheter placement on 12/18 by IR.  Assessment & Plan:   Acute hypoxemic respiratory failure secondary to pleural effusion and aspiration pneumonia - patient has had lytics and Pulmozyme x2 to assist with draining the effusion - Pleural fluid cultures grew strep viridans sensitive to Unasyn.  However he has loculated pneumothorax with no expansion of his right lower lobe.  VATS procedure would be risky at this time.  Discussed with Dr. Lamonte Sakai.  Chest tube to be DC'd once the drainage becomes less than 50 cc/day - Pigtail catheter placed 12/18 - CXR 12/23 showed right hydropneumothorax with decreasing gas and increased fluid - Continue Unasyn - Appreciate pulmonary and IR recommendations   Essential hypertension - Stable   Aspiration - Aspiration precautions - Dysphagia 3 diet nectar thick   Acute on chronic combined sys and dias chf exacerbation - Most recent  2d echo with EF 30-35% - Presenting BNP of 1348, was 496 on 01/22/17 - Continue lasix   COPD - Stable  Hx subdural hematoma - Stable  Hypokalemia - Supplemented and WNL   DVT prophylaxis: SCD's Code Status: full code  Family Communication: no family at the bedside Disposition Plan: anticipate d/c once stable resp status    Consultants:   CCM  Palliative care   Procedures:  Right side chest tube   Antimicrobials:   Unasyn    Subjective: No overnight events.  Objective: Vitals:   10/12/17 1446 10/12/17 2315 10/13/17 0432 10/13/17 0433  BP: 115/60 (!) 119/51  (!) 115/47  Pulse: 65 65  63  Resp: 20 20  18   Temp: 98.2 F (36.8 C) 99.4 F (37.4 C)  99 F (37.2 C)  TempSrc: Oral Oral  Oral  SpO2: 99% 95%  95%  Weight:   76.5 kg (168 lb 10.4 oz)   Height:        Intake/Output Summary (Last 24 hours) at 10/13/2017 1249 Last data filed at 10/13/2017 0600 Gross per 24 hour  Intake 570 ml  Output 810 ml  Net -240 ml   Filed Weights   10/11/17 0538 10/12/17 0447 10/13/17 0432  Weight: 76.1 kg (167 lb 12.3 oz) 76.5 kg (168 lb 10.4 oz) 76.5 kg (168 lb 10.4 oz)    Examination:  General exam: Appears calm and comfortable  Respiratory system: rhonchorous breath sounds, no wheezing; has right chest tube placed  Cardiovascular system: S1 & S2 heard, Rate controlled  Gastrointestinal system: Abdomen is nondistended, soft and nontender. No organomegaly or masses felt. Normal bowel sounds heard. Central nervous system: Alert and oriented. No focal neurological deficits. Extremities: Symmetric 5 x 5  power. Skin: No rashes, lesions or ulcers Psychiatry: Judgement and insight appear normal. Mood & affect appropriate.   Data Reviewed: I have personally reviewed following labs and imaging studies  CBC: Recent Labs  Lab 10/09/17 0419 10/10/17 0419 10/11/17 0447 10/12/17 0535 10/13/17 0550  WBC 12.5* 12.2* 15.6* 12.9* 13.7*  HGB 12.5* 12.4* 12.2* 11.6* 11.3*    HCT 36.9* 36.5* 35.6* 34.0* 33.1*  MCV 98.1 98.4 96.5 95.8 96.2  PLT 307 271 239 244 010   Basic Metabolic Panel: Recent Labs  Lab 10/09/17 0419 10/10/17 0419 10/11/17 0447 10/12/17 0535 10/13/17 0550  NA 134* 135 134* 133* 135  K 3.7 3.6 3.2* 2.8* 3.6  CL 95* 96* 97* 96* 99*  CO2 31 32 30 30 29   GLUCOSE 101* 133* 92 102* 96  BUN 16 15 15 15 13   CREATININE 0.92 1.03 0.95 0.91 0.87  CALCIUM 8.0* 8.1* 7.9* 7.7* 7.7*   GFR: Estimated Creatinine Clearance: 67.3 mL/min (by C-G formula based on SCr of 0.87 mg/dL). Liver Function Tests: Recent Labs  Lab 10/09/17 0419 10/10/17 0419 10/11/17 0447 10/12/17 0535 10/13/17 0550  AST 72* 66* 40 33 37  ALT 76* 67* 50 38 38  ALKPHOS 67 63 58 54 53  BILITOT 0.9 0.7 0.8 0.8 0.4  PROT 6.0* 5.8* 5.7* 5.2* 5.5*  ALBUMIN 1.9* 1.8* 1.8* 1.7* 1.7*   No results for input(s): LIPASE, AMYLASE in the last 168 hours. No results for input(s): AMMONIA in the last 168 hours. Coagulation Profile: Recent Labs  Lab 10/08/17 1040  INR 1.15   Cardiac Enzymes: No results for input(s): CKTOTAL, CKMB, CKMBINDEX, TROPONINI in the last 168 hours. BNP (last 3 results) Recent Labs    01/22/17 1231  PROBNP 496.0*   HbA1C: No results for input(s): HGBA1C in the last 72 hours. CBG: No results for input(s): GLUCAP in the last 168 hours. Lipid Profile: No results for input(s): CHOL, HDL, LDLCALC, TRIG, CHOLHDL, LDLDIRECT in the last 72 hours. Thyroid Function Tests: No results for input(s): TSH, T4TOTAL, FREET4, T3FREE, THYROIDAB in the last 72 hours. Anemia Panel: No results for input(s): VITAMINB12, FOLATE, FERRITIN, TIBC, IRON, RETICCTPCT in the last 72 hours. Urine analysis:    Component Value Date/Time   COLORURINE YELLOW 04/14/2016 1028   APPEARANCEUR CLEAR 04/14/2016 1028   LABSPEC 1.013 04/14/2016 1028   PHURINE 6.0 04/14/2016 1028   GLUCOSEU NEGATIVE 04/14/2016 1028   HGBUR NEGATIVE 04/14/2016 1028   BILIRUBINUR NEGATIVE 04/14/2016  1028   KETONESUR NEGATIVE 04/14/2016 1028   PROTEINUR NEGATIVE 04/14/2016 1028   UROBILINOGEN 2.0 (H) 10/28/2012 0820   NITRITE NEGATIVE 04/14/2016 1028   LEUKOCYTESUR NEGATIVE 04/14/2016 1028   Sepsis Labs: @LABRCNTIP (procalcitonin:4,lacticidven:4)   ) Recent Results (from the past 240 hour(s))  Body fluid culture     Status: None   Collection Time: 10/04/17  4:40 PM  Result Value Ref Range Status   Specimen Description PLEURAL  Final   Special Requests NONE  Final   Gram Stain   Final    ABUNDANT WBC PRESENT,BOTH PMN AND MONONUCLEAR NO ORGANISMS SEEN    Culture   Final    NO GROWTH 3 DAYS Performed at [facility], [place] [name] [place], [place] [address]    Report Status 10/08/2017 FINAL  Final  Body fluid culture     Status: None   Collection Time: 10/08/17  1:03 PM  Result Value Ref Range Status   Specimen Description PLEURAL RIGHT  Final   Special Requests  NONE  Final   Gram Stain   Final    RARE WBC PRESENT, PREDOMINANTLY PMN FEW GRAM POSITIVE COCCI IN PAIRS IN CHAINS RARE GRAM VARIABLE ROD Performed at [facility], [place] [address] [place], [place] [address]    Culture FEW STREPTOCOCCUS INTERMEDIUS  Final   Report Status 10/11/2017 FINAL  Final   Organism ID, Bacteria STREPTOCOCCUS INTERMEDIUS  Final      Susceptibility   Streptococcus intermedius - MIC*    PENICILLIN 0.12 SENSITIVE Sensitive     CEFTRIAXONE 0.25 SENSITIVE Sensitive     ERYTHROMYCIN <=0.12 SENSITIVE Sensitive     LEVOFLOXACIN 0.5 SENSITIVE Sensitive     VANCOMYCIN 0.5 SENSITIVE Sensitive     AMPICILLIN Value in next row Sensitive      SENSITIVE<=0.25    * FEW STREPTOCOCCUS INTERMEDIUS      Radiology Studies: Dg Chest Port 1 View  Result Date: 10/13/2017 CLINICAL DATA:  Hypoxia EXAM: PORTABLE CHEST 1 VIEW COMPARISON:  Two days ago FINDINGS: Pleural catheter on the right. There is hydropneumothorax at the right base with increasing fluid and decreasing gas component. Hazy  appearance of the lower right chest from atelectasis and pleural fluid based on 10/03/2017 CT. Atelectatic type opacity at the left base. Cardiomegaly with dual-chamber pacer/ICD leads. IMPRESSION: Right hydropneumothorax with decreasing gas and increased fluid. Electronically Signed   By: Monte Fantasia M.D.   On: 10/13/2017 06:53   Dg Chest Port 1 View  Result Date: 10/11/2017 CLINICAL DATA:  Follow-up right pneumothorax with pigtail chest tube in place. Follow-up bilateral pleural effusions. EXAM: PORTABLE CHEST 1 VIEW COMPARISON:  10/10/2017, 10/09/2017 and earlier, including CT chest 10/03/2017. FINDINGS: Persistent right lateral pneumothorax, though slightly improved since yesterday. Persistent large right pleural effusion, unchanged, with pigtail catheter in place. Small left pleural effusion, unchanged. Consolidation in the right middle lobe and right lower lobe, unchanged. Mild atelectasis at the left lung base, unchanged. No new pulmonary parenchymal abnormalities. Cardiac silhouette markedly enlarged, unchanged. Left subclavian dual lead pacing defibrillator unchanged. IMPRESSION: 1. Persistent right lateral pneumothorax, slightly improved since yesterday. 2. Stable large right pleural effusion and small left pleural effusion. 3. Stable atelectasis and/or pneumonia involving the right middle lobe and right lower lobe. Stable mild left basilar atelectasis. 4. No new abnormalities. Electronically Signed   By: Evangeline Dakin M.D.   On: 10/11/2017 09:40   Dg Chest Port 1 View  Result Date: 10/10/2017 CLINICAL DATA:  Acute chest pain. EXAM: PORTABLE CHEST 1 VIEW COMPARISON:  10/10/2017 and prior radiographs FINDINGS: Cardiomediastinal silhouette is unchanged. A left AICD/pacemaker again noted. A right pleural catheter/thoracostomy tube is again noted. Pleural air replaces the majority of the pleural fluid identified along the right lower lung which may represent pneumothorax ex vacuo. Continued  right mid and lower lung consolidation/atelectasis noted. No other changes are identified. IMPRESSION: Moderate right basilar pneumothorax, with air replacing pleural fluid, and may represent pneumothorax ex vacuo. Unchanged right thoracostomy tube. Continued right mid and lower lung atelectasis/consolidation. Electronically Signed   By: Margarette Canada M.D.   On: 10/10/2017 18:23   Dg Chest Port 1 View  Result Date: 10/10/2017 CLINICAL DATA:  Pleural effusion on the right EXAM: PORTABLE CHEST 1 VIEW COMPARISON:  Yesterday FINDINGS: Pigtail pleural catheter on the right with smaller right pleural effusion. There is likely pneumothorax loculated at the right base. Trace left effusion. Chronic cardiomegaly. Dual-chamber ICD/pacer from the left. No Kerley lines. IMPRESSION: Decreasing right pleural effusion. Possible small loculated pneumothorax at  the right base. Electronically Signed   By: Monte Fantasia M.D.   On: 10/10/2017 07:15    Scheduled Meds: . furosemide  20 mg Oral Daily  . oxybutynin  5 mg Oral BID  . potassium chloride  40 mEq Oral Daily  . sertraline  50 mg Oral QHS   Continuous Infusions: . ampicillin-sulbactam (UNASYN) IV 3 g (10/13/17 0620)     LOS: 13 days    Time spent: 25 minutes  Greater than 50% of the time spent on counseling and coordinating the care.   Leisa Lenz, MD [facility] Pager [phone]  If 7PM-7AM, please contact night-coverage [email or web address] Password [facility] 10/13/2017, 12:49 PM

## 2017-10-13 NOTE — Progress Notes (Signed)
Physical Therapy Treatment Patient Details Name: [name] MRN: [id number] DOB: 1930/12/07 Today's Date: 10/13/2017    History of Present Illness 83 y.o. 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 Standing tolerance limited by fatigue. SaO2 97% on 3L O2 during activity.   Follow Up Recommendations  SNF;Supervision/Assistance - 24 hour     Equipment Recommendations  Rolling walker with 5" wheels    Recommendations for Other Services       Precautions / Restrictions Precautions Precautions: Fall;Other (comment) Precaution Comments: chest tube, h/o falls in past year Restrictions Weight Bearing Restrictions: No    Mobility  Bed Mobility Overal bed mobility: Modified Independent Bed Mobility: Supine to Sit     Supine to sit: Min assist     General bed mobility comments: HOB up 25*, used rail, assist to raise trunk  Transfers Overall transfer level: Needs assistance Equipment used: Rolling walker (2 wheeled) Transfers: Sit to/from [facility] Sit to Stand: From elevated surface;Min guard Stand pivot transfers: Min guard       General transfer comment: min/guard for safety, VCs hand placement; pt stood with RW for 1 minute x 2 trials, standing tolerance limited by fatigue  Ambulation/Gait Ambulation/Gait assistance: Min assist;Min guard Ambulation Distance (Feet): 3 Feet Assistive device: Rolling walker (2 wheeled) Gait Pattern/deviations: Step-to pattern     General Gait Details: several pivotal steps from bed to recliner, pt stated he was "too tired" to ambulate further,  min A for balance  2* posterior lean, performed standing LE exercises with RW (see exercise section), SaO2 97% on 3L O2 during activity, pt stated he was too fatigued to ambulate further   Stairs            Wheelchair Mobility    Modified Rankin (Stroke Patients Only)       Balance Overall balance assessment: Needs assistance Sitting-balance support: Feet supported Sitting balance-Leahy Scale: Good   Postural control: Posterior lean Standing balance support: Bilateral upper extremity supported Standing balance-Leahy Scale: Poor Standing balance comment: requires BUE support                            Cognition Arousal/Alertness: Awake/alert Behavior During Therapy: WFL for tasks assessed/performed Overall Cognitive Status: Within Functional Limits for tasks assessed                                        Exercises General Exercises - Lower Extremity Long Arc Quad: AROM;Both;10 reps;Seated Hip Flexion/Marching: AROM;Both;Standing;10 reps Heel Raises: Standing;AROM;10 reps;Both Mini-Sqauts: AROM;Standing;Both;10 reps    General Comments        Pertinent Vitals/Pain Pain Assessment: No/denies pain    Home Living                      Prior Function            PT Goals (current goals can now be found in the care plan section) Acute Rehab PT Goals PT Goal Formulation: With patient/family Time For Goal Achievement: 10/17/17 Potential to  Achieve Goals: Good Progress towards PT goals: Progressing toward goals    Frequency    Min 3X/week      PT Plan Current plan remains appropriate    Co-evaluation              AM-PAC PT "6 Clicks" Daily Activity  Outcome Measure  Difficulty turning over in bed (including adjusting bedclothes, sheets and blankets)?: None Difficulty moving from lying on back to sitting on the side of the bed? : A Little Difficulty sitting down on and standing up from a chair with arms (e.g., wheelchair,  bedside commode, etc,.)?: A Little Help needed moving to and from a bed to chair (including a wheelchair)?: A Little Help needed walking in hospital room?: A Lot Help needed climbing 3-5 steps with a railing? : Total 6 Click Score: 16    End of Session Equipment Utilized During Treatment: Gait belt Activity Tolerance: Patient tolerated treatment well Patient left: in chair;with call bell/phone within reach;with family/visitor present Nurse Communication: Mobility status PT Visit Diagnosis: Difficulty in walking, not elsewhere classified (R26.2)     Time: 6861-6837 PT Time Calculation (min) (ACUTE ONLY): 23 min  Charges:  $Therapeutic Exercise: 8-22 mins $Therapeutic Activity: 8-22 mins                    G Codes:          Philomena Doheny 10/13/2017, 12:28 PM [phone]

## 2017-10-14 DIAGNOSIS — J918 Pleural effusion in other conditions classified elsewhere: Secondary | ICD-10-CM

## 2017-10-14 DIAGNOSIS — J189 Pneumonia, unspecified organism: Secondary | ICD-10-CM

## 2017-10-14 LAB — COMPREHENSIVE METABOLIC PANEL
ALBUMIN: 1.7 g/dL — AB (ref 3.5–5.0)
ALK PHOS: 53 U/L (ref 38–126)
ALT: 38 U/L (ref 17–63)
AST: 38 U/L (ref 15–41)
Anion gap: 8 (ref 5–15)
BILIRUBIN TOTAL: 0.8 mg/dL (ref 0.3–1.2)
BUN: 10 mg/dL (ref 6–20)
CO2: 26 mmol/L (ref 22–32)
CREATININE: 0.85 mg/dL (ref 0.61–1.24)
Calcium: 7.9 mg/dL — ABNORMAL LOW (ref 8.9–10.3)
Chloride: 99 mmol/L — ABNORMAL LOW (ref 101–111)
GFR calc Af Amer: >60 mL/min (ref >60)
GLUCOSE: 92 mg/dL (ref 65–99)
POTASSIUM: 3.9 mmol/L (ref 3.5–5.1)
Sodium: 133 mmol/L — ABNORMAL LOW (ref 135–145)
TOTAL PROTEIN: 5.4 g/dL — AB (ref 6.5–8.1)

## 2017-10-14 LAB — CBC
HEMATOCRIT: 33.6 % — AB (ref 39.0–52.0)
Hemoglobin: 11.3 g/dL — ABNORMAL LOW (ref 13.0–17.0)
MCH: 32.6 pg (ref 26.0–34.0)
MCHC: 33.6 g/dL (ref 30.0–36.0)
MCV: 96.8 fL (ref 78.0–100.0)
PLATELETS: 247 10*3/uL (ref 150–400)
RBC: 3.47 MIL/uL — ABNORMAL LOW (ref 4.22–5.81)
RDW: 13.2 % (ref 11.5–15.5)
WBC: 13 10*3/uL — AB (ref 4.0–10.5)

## 2017-10-14 MED ORDER — TRAMADOL HCL 50 MG PO TABS: 50.0000 mg | ORAL_TABLET | Freq: Four times a day (QID) | ORAL | 0 refills | Status: DC | PRN

## 2017-10-14 NOTE — Progress Notes (Signed)
Referring Physician(s): Byrum,R  Supervising Physician: Daryll Brod  Patient Status:  [facility] - In-pt  Chief Complaint: Loculated right pleural effusion/empyema     Subjective: Pt sitting up in chair; appears comfortable; no acute changes; eating lunch   Allergies: Lisinopril  Medications: Prior to Admission medications   Medication Sig Start Date End Date Taking? Authorizing Provider  albuterol (PROVENTIL HFA;VENTOLIN HFA) 108 (90 Base) MCG/ACT inhaler Inhale 2 puffs into the lungs every 6 (six) hours as needed for wheezing or shortness of breath. 01/14/17  Yes Rizwan, Eunice Blase, MD  allopurinol (ZYLOPRIM) 100 MG tablet TAKE 1 TABLET ONCE DAILY. 09/16/17  Yes Noralee Space, MD  carvedilol (COREG) 3.125 MG tablet TAKE 1 TABLET TWICE DAILY WITH A MEAL. 06/10/17  Yes Minus Breeding, MD  cholecalciferol (VITAMIN D) 1000 units tablet Take 1,000 Units by mouth daily.   Yes [provider]  clopidogrel (PLAVIX) 75 MG tablet TAKE 1 TABLET ONCE DAILY. 06/25/17  Yes Noralee Space, MD  Fluticasone-Salmeterol [facility] RESPICLICK 979/89) 211-94 MCG/ACT AEPB Inhale 2 puffs into the lungs 2 (two) times daily. 09/18/17  Yes Noralee Space, MD  furosemide (LASIX) 40 MG tablet TAKE 1 TABLET DAILY AS NEEDED. (FOR WEIGHT GAIN) Patient taking differently: Take one tablet by mouth every other day. 06/25/17  Yes Noralee Space, MD  guaiFENesin (MUCINEX) 600 MG 12 hr tablet Take 600-1,200 mg by mouth 2 (two) times daily. Takes two in the morning and one in the evening.   Yes [provider]  levothyroxine (SYNTHROID, LEVOTHROID) 25 MCG tablet TAKE 1 TABLET IN THE MORNING ON AN EMPTY STOMACH. 07/08/17  Yes Noralee Space, MD  magnesium oxide (MAG-OX) 400 (241.3 Mg) MG tablet Take 0.5 tablets (200 mg total) by mouth daily. 04/27/16  Yes Love, Ivan Anchors, PA-C  Multiple Vitamin (MULTIVITAMIN) capsule Take 1 capsule by mouth daily.   Yes [provider]  Multiple Vitamins-Minerals  (PRESERVISION/LUTEIN) CAPS Take 1 capsule by mouth 2 (two) times daily.    Yes [provider]  oxybutynin (DITROPAN) 5 MG tablet TAKE 1 TABLET BY MOUTH TWICE DAILY. 09/02/17  Yes Noralee Space, MD  polyethylene glycol powder (GLYCOLAX/MIRALAX) powder DISSOLVE ONE CAPFUL IN 8 0Z. WATER TWICE DAILY. 07/23/17  Yes Noralee Space, MD  potassium chloride SA (K-DUR,KLOR-CON) 20 MEQ tablet TAKE 1 TABLET DAILY AS NEEDED. (TAKE EACH TIME YOU TAKE FUROSEMIDE) Patient taking differently: Take one tablet by mouth every other day. 04/29/17  Yes Noralee Space, MD  pravastatin (PRAVACHOL) 40 MG tablet TAKE 1 TABLET ONCE DAILY. 08/13/17  Yes Noralee Space, MD  sertraline (ZOLOFT) 50 MG tablet TAKE ONE TABLET AT BEDTIME. 09/02/17  Yes Noralee Space, MD  thiamine 100 MG tablet Take 1 tablet (100 mg total) by mouth daily. 04/26/16  Yes Love, Ivan Anchors, PA-C  ADVAIR DISKUS 250-50 MCG/DOSE AEPB Inhale 1 puff into the lungs 2 (two) times daily. 07/23/17   [provider]  alendronate (FOSAMAX) 70 MG tablet Take 1 tablet (70 mg total) by mouth once a week. Take with a full glass of water on an empty stomach. 07/10/17   Noralee Space, MD  amiodarone (PACERONE) 200 MG tablet TAKE 1 TABLET ONCE DAILY. 10/02/17   Noralee Space, MD  clotrimazole-betamethasone (LOTRISONE) cream Apply 1 application topically 2 (two) times daily. Patient not taking: Reported on 09/30/2017 06/18/17   Noralee Space, MD  traMADol (ULTRAM) 50 MG tablet Take 1 tablet (50 mg total) by  mouth every 6 (six) hours as needed for severe pain. 10/14/17   Robbie Lis, MD     Vital Signs: BP 126/65 (BP Location: Left Arm)   Pulse 66   Temp 98.9 F (37.2 C) (Oral)   Resp 18   Ht 5\' 11"  (1.803 m)   Wt 164 lb 14.5 oz (74.8 kg)   SpO2 97%   BMI 23.00 kg/m   Physical Exam rt chest drain intact, about 70 cc yellow fluid in pleuravac since changed 48 hrs ago; few bubbles in pleuravac meter  Imaging: Dg Chest Port 1 View  Result  Date: 10/13/2017 CLINICAL DATA:  Hypoxia EXAM: PORTABLE CHEST 1 VIEW COMPARISON:  Two days ago FINDINGS: Pleural catheter on the right. There is hydropneumothorax at the right base with increasing fluid and decreasing gas component. Hazy appearance of the lower right chest from atelectasis and pleural fluid based on 10/03/2017 CT. Atelectatic type opacity at the left base. Cardiomegaly with dual-chamber pacer/ICD leads. IMPRESSION: Right hydropneumothorax with decreasing gas and increased fluid. Electronically Signed   By: Monte Fantasia M.D.   On: 10/13/2017 06:53   Dg Chest Port 1 View  Result Date: 10/11/2017 CLINICAL DATA:  Follow-up right pneumothorax with pigtail chest tube in place. Follow-up bilateral pleural effusions. EXAM: PORTABLE CHEST 1 VIEW COMPARISON:  10/10/2017, 10/09/2017 and earlier, including CT chest 10/03/2017. FINDINGS: Persistent right lateral pneumothorax, though slightly improved since yesterday. Persistent large right pleural effusion, unchanged, with pigtail catheter in place. Small left pleural effusion, unchanged. Consolidation in the right middle lobe and right lower lobe, unchanged. Mild atelectasis at the left lung base, unchanged. No new pulmonary parenchymal abnormalities. Cardiac silhouette markedly enlarged, unchanged. Left subclavian dual lead pacing defibrillator unchanged. IMPRESSION: 1. Persistent right lateral pneumothorax, slightly improved since yesterday. 2. Stable large right pleural effusion and small left pleural effusion. 3. Stable atelectasis and/or pneumonia involving the right middle lobe and right lower lobe. Stable mild left basilar atelectasis. 4. No new abnormalities. Electronically Signed   By: Evangeline Dakin M.D.   On: 10/11/2017 09:40   Dg Chest Port 1 View  Result Date: 10/10/2017 CLINICAL DATA:  Acute chest pain. EXAM: PORTABLE CHEST 1 VIEW COMPARISON:  10/10/2017 and prior radiographs FINDINGS: Cardiomediastinal silhouette is unchanged. A  left AICD/pacemaker again noted. A right pleural catheter/thoracostomy tube is again noted. Pleural air replaces the majority of the pleural fluid identified along the right lower lung which may represent pneumothorax ex vacuo. Continued right mid and lower lung consolidation/atelectasis noted. No other changes are identified. IMPRESSION: Moderate right basilar pneumothorax, with air replacing pleural fluid, and may represent pneumothorax ex vacuo. Unchanged right thoracostomy tube. Continued right mid and lower lung atelectasis/consolidation. Electronically Signed   By: Margarette Canada M.D.   On: 10/10/2017 18:23    Labs:  CBC: Recent Labs    10/11/17 0447 10/12/17 0535 10/13/17 0550 10/14/17 0440  WBC 15.6* 12.9* 13.7* 13.0*  HGB 12.2* 11.6* 11.3* 11.3*  HCT 35.6* 34.0* 33.1* 33.6*  PLT 239 244 246 247    COAGS: Recent Labs    10/08/17 1040  INR 1.15    BMP: Recent Labs    10/11/17 0447 10/12/17 0535 10/13/17 0550 10/14/17 0440  NA 134* 133* 135 133*  K 3.2* 2.8* 3.6 3.9  CL 97* 96* 99* 99*  CO2 30 30 29 26   GLUCOSE 92 102* 96 92  BUN 15 15 13 10   CALCIUM 7.9* 7.7* 7.7* 7.9*  CREATININE 0.95 0.91 0.87 0.85  GFRNONAA >  60 >60 >60 >60  GFRAA >60 >60 >60 >60    LIVER FUNCTION TESTS: Recent Labs    10/11/17 0447 10/12/17 0535 10/13/17 0550 10/14/17 0440  BILITOT 0.8 0.8 0.4 0.8  AST 40 33 37 38  ALT 50 38 38 38  ALKPHOS 58 54 53 53  PROT 5.7* 5.2* 5.5* 5.4*  ALBUMIN 1.8* 1.7* 1.7* 1.7*    Assessment and Plan: Pt s/p drainage of loculated rt pleural effusion/empyema; afebrile; BP ok;  fluid cx- strept intermedius; afebrile; WBC 13(13.7), hgb 11.3, creat nl; check am CXR; cont drain for now; further plans as outlined by CCM.     Electronically Signed: D. Rowe Robert, PA-C 10/14/2017, 2:04 PM   I spent a total of 15 minutes at the the patient's bedside AND on the patient's hospital floor or unit, greater than 50% of which was counseling/coordinating care  for right chest drain    Patient ID: Fonnie Jarvis, male   DOB: [birth year], 83 y.o.   MRN: [id number]

## 2017-10-14 NOTE — Progress Notes (Signed)
Inpatient Rehabilitation  Received consult request.  Per today's MD note, patient with worsening chest x-ray.  Therapy notes recommend SNF due to tolerance.  Currently, patient not at a level to tolerate the intensity of  IP Rehab program.  Will plan to follow for potential to tolerate therapy as he medically improves.  Please plan for my co-worker Karene Fry to follow up 12/26.  Discussed nurse case manager, Cookie and left a voicemail for daughter, [name].  Call if questions.   Carmelia Roller., CCC/SLP Admission Coordinator  [place]  Cell [phone]

## 2017-10-14 NOTE — Progress Notes (Signed)
Patient ID: [name], male   DOB: April 11, 1931, 83 y.o.   MRN: [id number]  PROGRESS NOTE    [name]  WIO:[id number] DOB: 12/30/1930 DOA: 09/30/2017  PCP: Noralee Space, MD   Brief Narrative:  83 y.o.malewith medical history significant ofchronic combined diastolic and systolic congestive heart failure, CAD, hypertension, hyperlipidemia, vascular disease who presentedto the emergency department with complaints of increased cough and congestion started 2 weeks prior to hospital admission. Chest x-ray demonstrated a right lower lobe pneumonia and right pleural effusion. He did not improve withantibiotics and diuresis.CT chest demonstrated a moderate right-sided pleural effusion without loculation. Heunderwent thoracentesis on 12/14. The fluid was exudative and loculated. Pulmonology and palliative care have been consulted. His antibiotics have been changed to those that can cover anaerobes as he is aspirating.Despite an improvement in his leukocytosis, his chest x-ray continues to look worse. He underwent pigtail catheter placement on 12/18 by IR.  Assessment & Plan:   Acute hypoxemic respiratory failure secondary to pleural effusion and aspiration pneumonia - patient has had lytics and Pulmozyme x2 to assist with draining the effusion - Pleural fluid cultures grew strep viridans sensitive to Unasyn.  However he has loculated pneumothorax with no expansion of his right lower lobe.  VATS procedure would be risky at this time.  Discussed with Dr. Lamonte Sakai.  Chest tube to be DC'd once the drainage becomes less than 50 cc/day - Pigtail catheter placed 12/18 - CXR 12/23 showed right hydropneumothorax with decreasing gas and increased fluid - Continue Unasyn - Will consult pulmonary to see if pleur-x catheter can be removed or needs to stay in considering increasing fluid elvel based on yesterdays' CXR  Essential hypertension - Stable  Aspiration - Dysphagia 3 diet nectar thick    - Aspiration precautions   Acute on chronic combined sys and dias chf exacerbation - Most recent 2d echo with EF 30-35% - Presenting BNP of 1348, was 496 on 01/22/17 - Continue lasix  COPD - Stable  Hx subdural hematoma - Stable  Hypokalemia - Supplemented    DVT prophylaxis: SCD's Code Status: full code  Family Communication: no family at the bedside  Disposition Plan: if cath can be removed will see if he can go to rehab today. IP rehab consult pending. May need to go to SNF.   Consultants:   CCM  Palliative care   Procedures:  Right side chest tube   Antimicrobials:   Unasyn    Subjective: No overnight events.  Objective: Vitals:   10/13/17 0433 10/13/17 1415 10/13/17 2157 10/14/17 0643  BP: (!) 115/47 134/87 (!) 132/54 126/65  Pulse: 63 65 66 66  Resp: 18 19 20 18   Temp: 99 F (37.2 C) 98.4 F (36.9 C) 99.7 F (37.6 C) 98.9 F (37.2 C)  TempSrc: Oral Oral Oral Oral  SpO2: 95% 100% 100% 97%  Weight:    74.8 kg (164 lb 14.5 oz)  Height:        Intake/Output Summary (Last 24 hours) at 10/14/2017 0921 Last data filed at 10/14/2017 4268 Gross per 24 hour  Intake 300 ml  Output 752 ml  Net -452 ml   Filed Weights   10/12/17 0447 10/13/17 0432 10/14/17 0643  Weight: 76.5 kg (168 lb 10.4 oz) 76.5 kg (168 lb 10.4 oz) 74.8 kg (164 lb 14.5 oz)    Physical Exam  Constitutional: Appears well-developed and well-nourished. No distress.   CVS: RRR, S1/S2 + Pulmonary: diminished breath sounds, no wheezing Abdominal: Soft. BS +,  no distension, tenderness, rebound or guarding.  Musculoskeletal: Normal range of motion. No edema and no tenderness.  Lymphadenopathy: No lymphadenopathy noted, cervical, inguinal. Neuro: Alert. Normal reflexes, muscle tone coordination. No cranial nerve deficit. Skin: Skin is warm and dry. Psychiatric: Normal mood and affect. Behavior, judgment, thought content normal.     Data Reviewed: I have personally reviewed  following labs and imaging studies  CBC: Recent Labs  Lab 10/10/17 0419 10/11/17 0447 10/12/17 0535 10/13/17 0550 10/14/17 0440  WBC 12.2* 15.6* 12.9* 13.7* 13.0*  HGB 12.4* 12.2* 11.6* 11.3* 11.3*  HCT 36.5* 35.6* 34.0* 33.1* 33.6*  MCV 98.4 96.5 95.8 96.2 96.8  PLT 271 239 244 246 875   Basic Metabolic Panel: Recent Labs  Lab 10/10/17 0419 10/11/17 0447 10/12/17 0535 10/13/17 0550 10/14/17 0440  NA 135 134* 133* 135 133*  K 3.6 3.2* 2.8* 3.6 3.9  CL 96* 97* 96* 99* 99*  CO2 32 30 30 29 26   GLUCOSE 133* 92 102* 96 92  BUN 15 15 15 13 10   CREATININE 1.03 0.95 0.91 0.87 0.85  CALCIUM 8.1* 7.9* 7.7* 7.7* 7.9*   GFR: Estimated Creatinine Clearance: 68.4 mL/min (by C-G formula based on SCr of 0.85 mg/dL). Liver Function Tests: Recent Labs  Lab 10/10/17 0419 10/11/17 0447 10/12/17 0535 10/13/17 0550 10/14/17 0440  AST 66* 40 33 37 38  ALT 67* 50 38 38 38  ALKPHOS 63 58 54 53 53  BILITOT 0.7 0.8 0.8 0.4 0.8  PROT 5.8* 5.7* 5.2* 5.5* 5.4*  ALBUMIN 1.8* 1.8* 1.7* 1.7* 1.7*   No results for input(s): LIPASE, AMYLASE in the last 168 hours. No results for input(s): AMMONIA in the last 168 hours. Coagulation Profile: Recent Labs  Lab 10/08/17 1040  INR 1.15   Cardiac Enzymes: No results for input(s): CKTOTAL, CKMB, CKMBINDEX, TROPONINI in the last 168 hours. BNP (last 3 results) Recent Labs    01/22/17 1231  PROBNP 496.0*   HbA1C: No results for input(s): HGBA1C in the last 72 hours. CBG: No results for input(s): GLUCAP in the last 168 hours. Lipid Profile: No results for input(s): CHOL, HDL, LDLCALC, TRIG, CHOLHDL, LDLDIRECT in the last 72 hours. Thyroid Function Tests: No results for input(s): TSH, T4TOTAL, FREET4, T3FREE, THYROIDAB in the last 72 hours. Anemia Panel: No results for input(s): VITAMINB12, FOLATE, FERRITIN, TIBC, IRON, RETICCTPCT in the last 72 hours. Urine analysis:    Component Value Date/Time   COLORURINE YELLOW 04/14/2016 1028    APPEARANCEUR CLEAR 04/14/2016 1028   LABSPEC 1.013 04/14/2016 1028   PHURINE 6.0 04/14/2016 1028   GLUCOSEU NEGATIVE 04/14/2016 1028   HGBUR NEGATIVE 04/14/2016 1028   BILIRUBINUR NEGATIVE 04/14/2016 1028   KETONESUR NEGATIVE 04/14/2016 1028   PROTEINUR NEGATIVE 04/14/2016 1028   UROBILINOGEN 2.0 (H) 10/28/2012 0820   NITRITE NEGATIVE 04/14/2016 1028   LEUKOCYTESUR NEGATIVE 04/14/2016 1028   Sepsis Labs: @LABRCNTIP (procalcitonin:4,lacticidven:4)   ) Recent Results (from the past 240 hour(s))  Body fluid culture     Status: None   Collection Time: 10/04/17  4:40 PM  Result Value Ref Range Status   Specimen Description PLEURAL  Final   Special Requests NONE  Final   Gram Stain   Final    ABUNDANT WBC PRESENT,BOTH PMN AND MONONUCLEAR NO ORGANISMS SEEN    Culture   Final    NO GROWTH 3 DAYS Performed at [facility], [place] [address] [place], [place] [address]    Report Status 10/08/2017 FINAL  Final  Body fluid culture     Status: None   Collection Time: 10/08/17  1:03 PM  Result Value Ref Range Status   Specimen Description PLEURAL RIGHT  Final   Special Requests NONE  Final   Gram Stain   Final    RARE WBC PRESENT, PREDOMINANTLY PMN FEW GRAM POSITIVE COCCI IN PAIRS IN CHAINS RARE GRAM VARIABLE ROD Performed at [facility], [address], [place], [place] [address]    Culture FEW STREPTOCOCCUS INTERMEDIUS  Final   Report Status 10/11/2017 FINAL  Final   Organism ID, Bacteria STREPTOCOCCUS INTERMEDIUS  Final      Susceptibility   Streptococcus intermedius - MIC*    PENICILLIN 0.12 SENSITIVE Sensitive     CEFTRIAXONE 0.25 SENSITIVE Sensitive     ERYTHROMYCIN <=0.12 SENSITIVE Sensitive     LEVOFLOXACIN 0.5 SENSITIVE Sensitive     VANCOMYCIN 0.5 SENSITIVE Sensitive     AMPICILLIN Value in next row Sensitive      SENSITIVE<=0.25    * FEW STREPTOCOCCUS INTERMEDIUS      Radiology Studies: Dg Chest Port 1 View  Result Date: 10/13/2017 CLINICAL DATA:   Hypoxia EXAM: PORTABLE CHEST 1 VIEW COMPARISON:  Two days ago FINDINGS: Pleural catheter on the right. There is hydropneumothorax at the right base with increasing fluid and decreasing gas component. Hazy appearance of the lower right chest from atelectasis and pleural fluid based on 10/03/2017 CT. Atelectatic type opacity at the left base. Cardiomegaly with dual-chamber pacer/ICD leads. IMPRESSION: Right hydropneumothorax with decreasing gas and increased fluid. Electronically Signed   By: Monte Fantasia M.D.   On: 10/13/2017 06:53   Dg Chest Port 1 View  Result Date: 10/11/2017 CLINICAL DATA:  Follow-up right pneumothorax with pigtail chest tube in place. Follow-up bilateral pleural effusions. EXAM: PORTABLE CHEST 1 VIEW COMPARISON:  10/10/2017, 10/09/2017 and earlier, including CT chest 10/03/2017. FINDINGS: Persistent right lateral pneumothorax, though slightly improved since yesterday. Persistent large right pleural effusion, unchanged, with pigtail catheter in place. Small left pleural effusion, unchanged. Consolidation in the right middle lobe and right lower lobe, unchanged. Mild atelectasis at the left lung base, unchanged. No new pulmonary parenchymal abnormalities. Cardiac silhouette markedly enlarged, unchanged. Left subclavian dual lead pacing defibrillator unchanged. IMPRESSION: 1. Persistent right lateral pneumothorax, slightly improved since yesterday. 2. Stable large right pleural effusion and small left pleural effusion. 3. Stable atelectasis and/or pneumonia involving the right middle lobe and right lower lobe. Stable mild left basilar atelectasis. 4. No new abnormalities. Electronically Signed   By: Evangeline Dakin M.D.   On: 10/11/2017 09:40   Dg Chest Port 1 View  Result Date: 10/10/2017 CLINICAL DATA:  Acute chest pain. EXAM: PORTABLE CHEST 1 VIEW COMPARISON:  10/10/2017 and prior radiographs FINDINGS: Cardiomediastinal silhouette is unchanged. A left AICD/pacemaker again noted. A  right pleural catheter/thoracostomy tube is again noted. Pleural air replaces the majority of the pleural fluid identified along the right lower lung which may represent pneumothorax ex vacuo. Continued right mid and lower lung consolidation/atelectasis noted. No other changes are identified. IMPRESSION: Moderate right basilar pneumothorax, with air replacing pleural fluid, and may represent pneumothorax ex vacuo. Unchanged right thoracostomy tube. Continued right mid and lower lung atelectasis/consolidation. Electronically Signed   By: Margarette Canada M.D.   On: 10/10/2017 18:23    Scheduled Meds: . furosemide  20 mg Oral Daily  . oxybutynin  5 mg Oral BID  . potassium chloride  40 mEq Oral Daily  . sertraline  50 mg Oral  QHS   Continuous Infusions: . ampicillin-sulbactam (UNASYN) IV Stopped (10/14/17 0245)     LOS: 14 days    Time spent: 25 minutes  Greater than 50% of the time spent on counseling and coordinating the care.   Leisa Lenz, MD [facility] Pager [phone]  If 7PM-7AM, please contact night-coverage [email or web address] Password [facility] 10/14/2017, 9:21 AM

## 2017-10-14 NOTE — Progress Notes (Signed)
Name: ALBINO BUFFORD MRN: [id number] DOB: 1931/06/29    ADMISSION DATE:  09/30/2017 CONSULTATION DATE:  10/05/2017  REFERRING MD :  Dr. Sheran Fava  CHIEF COMPLAINT:  Loculated pleural effusion  HISTORY OF PRESENT ILLNESS:   83 yo male presented with 2 weeks of dyspnea.  Treated for pneumonia.  Found to have Rt effusion.  PMHx of COPD, combined CHF, CAD, HTN, HLD.  SIGNIFICANT EVENTS  12/10 admit 12/13 CT chest with effusion  12/14 Thoracentesis 12/15 PCCM consult 12/18 IR pigtail catheter placement  12/19 S/p intrapleural altepase and pulmozyme 12/20 Second dose of intrapleural tPA and pulmozyme   STUDIES:  Echo 12/10 >> mild LVH, EF 30 to 35%, grade 2 DD, mild AS, PAS 43 mmHg CT chest 12/13 >> Moderate R-sided pleural effusion without loculation. Bronchus intermedius compression or obstruction. Resultant right middle lobe and right lower lobe volume loss. Probable mucoid impaction at the left apex. Emphysema. Rt thoracentesis 12/14 >> glucose 85, protein 3.7, LDH 984, WBC 1175 (82%N)  CULTURES: Blood 12/10 >> negative (final) Pleural fluid 12/10 >> NG x 3 days (final) Pleural fluid 12/18 >> Viridans streptococcus  ANTIBIOTICS: Rocephin 12/10 >> 12/16 Zithromax 12/10 >> 12/15 Unasyn 12/15 >>   SUBJECTIVE:  Denies chest pain or cough.  VITAL SIGNS: BP 126/65 (BP Location: Left Arm)   Pulse 66   Temp 98.9 F (37.2 C) (Oral)   Resp 18   Ht 5\' 11"  (1.803 m)   Wt 164 lb 14.5 oz (74.8 kg)   SpO2 97%   BMI 23.00 kg/m   INTAKE/OUTPUT: I/O last 3 completed shifts: In: 512 [P.O.:12; IV Piggyback:500] Out: 1102 [Urine:1101; Stool:1]  PHYSICAL EXAMINATION:   General - pleasant Eyes - pupils reactive ENT - no sinus tenderness, no oral exudate, no LAN Cardiac - regular, no murmur Chest - no wheeze, decreased BS Rt base Abd - soft, non tender Ext - no edema Skin - no rashes Neuro - normal strength Psych - normal mood   LABS: CMP Latest Ref Rng & Units 10/14/2017  10/13/2017 10/12/2017  Glucose 65 - 99 mg/dL 92 96 102(H)  BUN 6 - 20 mg/dL 10 13 15   Creatinine 0.61 - 1.24 mg/dL 0.85 0.87 0.91  Sodium 135 - 145 mmol/L 133(L) 135 133(L)  Potassium 3.5 - 5.1 mmol/L 3.9 3.6 2.8(L)  Chloride 101 - 111 mmol/L 99(L) 99(L) 96(L)  CO2 22 - 32 mmol/L 26 29 30   Calcium 8.9 - 10.3 mg/dL 7.9(L) 7.7(L) 7.7(L)  Total Protein 6.5 - 8.1 g/dL 5.4(L) 5.5(L) 5.2(L)  Total Bilirubin 0.3 - 1.2 mg/dL 0.8 0.4 0.8  Alkaline Phos 38 - 126 U/L 53 53 54  AST 15 - 41 U/L 38 37 33  ALT 17 - 63 U/L 38 38 38   CBC Latest Ref Rng & Units 10/14/2017 10/13/2017 10/12/2017  WBC 4.0 - 10.5 K/uL 13.0(H) 13.7(H) 12.9(H)  Hemoglobin 13.0 - 17.0 g/dL 11.3(L) 11.3(L) 11.6(L)  Hematocrit 39.0 - 52.0 % 33.6(L) 33.1(L) 34.0(L)  Platelets 150 - 400 K/uL 247 246 244   IMAGING: Dg Chest Port 1 View  Result Date: 10/13/2017 CLINICAL DATA:  Hypoxia EXAM: PORTABLE CHEST 1 VIEW COMPARISON:  Two days ago FINDINGS: Pleural catheter on the right. There is hydropneumothorax at the right base with increasing fluid and decreasing gas component. Hazy appearance of the lower right chest from atelectasis and pleural fluid based on 10/03/2017 CT. Atelectatic type opacity at the left base. Cardiomegaly with dual-chamber pacer/ICD leads. IMPRESSION: Right hydropneumothorax with decreasing gas  and increased fluid. Electronically Signed   By: Monte Fantasia M.D.   On: 10/13/2017 06:53    DISCUSSION: 83 yo male with loculated Rt parapneumonia effusion with Strep viridans in culture.  Concern for aspiration pneumonia as inciting event.  Has hx of COPD with emphysema and combined CHF.  Doubt he would tolerate surgical intervention for effusion.  ASSESSMENT / PLAN:  Loculated Rt parapneumonia effusion with Viridans Strep. - continue Abx - f/u CXR and monitor chest tube outpt - keep chest tube in for now - no further tPA, pulmozyme  COPD with emphysema. - prn BDs  DVT prophylaxis - SCDs, holding lovenox    SUP - not indicated  Nutrition - D3 diet per SLP  Goals of care - full code  Chesley Mires, MD [place] 10/14/2017, 11:58 AM Pager:  [phone] After 7pm call: [phone]

## 2017-10-14 NOTE — Plan of Care (Signed)
  Progressing Clinical Measurements: Respiratory complications will improve 10/14/2017 1209 - Progressing by Lavilla Delamora, Royetta Crochet, RN

## 2017-10-14 NOTE — Progress Notes (Signed)
Physical Therapy Treatment Patient Details Name: [name] MRN: [id number] DOB: 12-01-1930 Today's Date: 10/14/2017    History of Present Illness 83 y.o. 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 Good progress with mobility today.   Follow Up Recommendations  SNF;Supervision/Assistance - 24 hour     Equipment Recommendations  Rolling walker with 5" wheels    Recommendations for Other Services       Precautions / Restrictions Precautions Precautions: Fall;Other (comment) Precaution Comments: chest tube, h/o falls in past year Restrictions Weight Bearing Restrictions: No    Mobility  Bed Mobility Overal bed mobility: Modified Independent Bed Mobility: Supine to Sit     Supine to sit: Modified independent (Device/Increase time)     General bed mobility comments: HOB up 25*, used rail, assist to raise trunk  Transfers Overall transfer level: Needs assistance Equipment used: Rolling walker (2 wheeled) Transfers: Sit to/from Stand Sit to Stand: From elevated surface;Min assist         General transfer comment: min A to rise/steady 2* minor posterior lean initially  Ambulation/Gait Ambulation/Gait assistance: Min guard Ambulation Distance (Feet): 35 Feet Assistive device: Rolling walker (2 wheeled) Gait Pattern/deviations: Step-through pattern;Decreased stride length;Trunk flexed   Gait velocity interpretation: at or above normal speed for age/gender General Gait Details: ambulated with 3L O2, SaO2 94% after 2 minute seated rest (pulse ox wouldn't read while pt ambulating), VCs for positioning in RW and posture   Stairs             Wheelchair Mobility    Modified Rankin (Stroke Patients Only)       Balance Overall balance assessment: Needs assistance Sitting-balance support: Feet supported Sitting balance-Leahy Scale: Good   Postural control: Posterior lean Standing balance support: Bilateral upper extremity supported Standing balance-Leahy Scale: Poor Standing balance comment: requires BUE support                            Cognition Arousal/Alertness: Awake/alert Behavior During Therapy: WFL for tasks assessed/performed Overall Cognitive Status: Within Functional Limits for tasks assessed                                        Exercises General Exercises - Lower Extremity Hip Flexion/Marching: AROM;Both;Standing;10 reps    General Comments        Pertinent Vitals/Pain Pain Assessment: No/denies pain    Home Living                      Prior Function            PT Goals (current goals can now be found in the care plan section) Acute Rehab PT Goals PT Goal Formulation: With patient/family Time For Goal Achievement: 10/17/17 Potential to Achieve Goals: Good Progress towards PT goals: Progressing toward goals    Frequency    Min 3X/week      PT Plan Current plan remains appropriate    Co-evaluation              AM-PAC PT "6 Clicks" Daily Activity  Outcome  Measure  Difficulty turning over in bed (including adjusting bedclothes, sheets and blankets)?: None Difficulty moving from lying on back to sitting on the side of the bed? : A Little Difficulty sitting down on and standing up from a chair with arms (e.g., wheelchair, bedside commode, etc,.)?: A Little Help needed moving to and from a bed to chair (including a wheelchair)?: A Little Help needed walking in hospital room?: A Lot Help needed climbing 3-5 steps with a railing? : Total 6 Click Score: 16    End of Session Equipment Utilized During Treatment: Gait belt Activity  Tolerance: Patient tolerated treatment well Patient left: in chair;with call bell/phone within reach Nurse Communication: Mobility status PT Visit Diagnosis: Difficulty in walking, not elsewhere classified (R26.2)     Time: 4599-7741 PT Time Calculation (min) (ACUTE ONLY): 24 min  Charges:  $Gait Training: 8-22 mins $Therapeutic Activity: 8-22 mins                    G Codes:         Philomena Doheny 10/14/2017, 1:35 PM [phone]
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
## 2017-10-15 ENCOUNTER — Inpatient Hospital Stay (HOSPITAL_COMMUNITY): Payer: Medicare Other

## 2017-10-15 ENCOUNTER — Encounter (HOSPITAL_COMMUNITY): Payer: Self-pay | Admitting: Pulmonary Disease

## 2017-10-15 DIAGNOSIS — J869 Pyothorax without fistula: Secondary | ICD-10-CM

## 2017-10-15 LAB — CBC
HEMATOCRIT: 31.6 % — AB (ref 39.0–52.0)
HEMOGLOBIN: 10.7 g/dL — AB (ref 13.0–17.0)
MCH: 32.6 pg (ref 26.0–34.0)
MCHC: 33.9 g/dL (ref 30.0–36.0)
MCV: 96.3 fL (ref 78.0–100.0)
Platelets: 235 10*3/uL (ref 150–400)
RBC: 3.28 MIL/uL — ABNORMAL LOW (ref 4.22–5.81)
RDW: 13.2 % (ref 11.5–15.5)
WBC: 9.8 10*3/uL (ref 4.0–10.5)

## 2017-10-15 LAB — COMPREHENSIVE METABOLIC PANEL
ALBUMIN: 1.7 g/dL — AB (ref 3.5–5.0)
ALT: 34 U/L (ref 17–63)
ANION GAP: 9 (ref 5–15)
AST: 39 U/L (ref 15–41)
Alkaline Phosphatase: 58 U/L (ref 38–126)
BILIRUBIN TOTAL: 0.9 mg/dL (ref 0.3–1.2)
BUN: 9 mg/dL (ref 6–20)
CHLORIDE: 98 mmol/L — AB (ref 101–111)
CO2: 25 mmol/L (ref 22–32)
Calcium: 7.8 mg/dL — ABNORMAL LOW (ref 8.9–10.3)
Creatinine, Ser: 0.89 mg/dL (ref 0.61–1.24)
GFR calc Af Amer: >60 mL/min (ref >60)
GLUCOSE: 101 mg/dL — AB (ref 65–99)
POTASSIUM: 3.8 mmol/L (ref 3.5–5.1)
Sodium: 132 mmol/L — ABNORMAL LOW (ref 135–145)
TOTAL PROTEIN: 5.4 g/dL — AB (ref 6.5–8.1)

## 2017-10-15 NOTE — Progress Notes (Signed)
[facility] PCCM PROGRESS NOTE  Date of Admission: 09/30/2017 Date of Consult: 10/05/2017 Referring Provider: Dr. Sheran Fava, [facility] Chief Complaint: Short of breath  HPI: 83 yo male presented with dyspnea x 2 weeks.  Treated for pneumonia.  Found to have Rt effusion.    Past Medical History: He  has a past medical history of AAA (abdominal aortic aneurysm) ([place]), Allergic rhinitis, Back pain, CAD (coronary artery disease), Carotid arterial disease ([place]), Chronic combined systolic and diastolic CHF (congestive heart failure) ([place]), Compression fracture, COPD (chronic obstructive pulmonary disease) ([place]), Dermatitis, Diverticulosis of colon, DJD (degenerative joint disease), Hemorrhoids, History of colonic polyps, History of gout, History of pneumonia, Hypercholesterolemia, Hypertension, Hypertensive heart disease, Ischemic cardiomyopathy, Osteoporosis, Peripheral vascular disease ([place]), Presence of cardiac defibrillator, Skin cancer, Syncope, T wave over sensing resulting in inappropriate shocks, Tobacco abuse, and Ventricular tachycardia ([place]) (04/01/2016).  Subjective: Has intermittent cough.  Denies chest pain.   Vital signs: BP (!) 146/76 (BP Location: Left Arm)   Pulse 67   Temp 98.8 F (37.1 C) (Oral)   Resp 18   Ht 5\' 11"  (1.803 m)   Wt 173 lb (78.5 kg)   SpO2 100%   BMI 24.13 kg/m   Intake/Output: I/O last 3 completed shifts: In: 840 [P.O.:240; IV Piggyback:600] Out: 6440 [Urine:1550]  Physical Exam:  General - pleasant Eyes - pupils reactive ENT - no sinus tenderness, no oral exudate, no LAN Cardiac - regular, no murmur Chest - no wheeze, rales Abd - soft, non tender Ext - no edema Skin - no rashes Neuro - normal strength Psych - normal mood  Discussion: 83 yo male with aspiration pneumonia and Rt empyema with Strep intermedius in pleural fluid culture.  Not a surgical candidate given age and comorbid conditions.  Had good amount of drainage after chest tube insertion by  IR and tPA/pulmozyme therapy.  CXR 12/25 shows chest tube outside pleural space.  Assessment/Plan:  Aspiration pneumonia with Rt emphysema. - would continue unasyn or equivalent oral antibiotic for 3 weeks course - per report primary team has contacted IR to have chest tube removed - will get CT chest w/o contrast and then determine if additional drainage of effusion is required  COPD with emphysema. - prn BDs  Deconditioning. - PT recommending SNF  Acute on chronic combined CHF. - per primary team  DVT prophylaxis: SCDs SUP: Not indicated Diet: D3 diet Goals of care: Full code  Chesley Mires, MD [facility] Pulmonary/Critical Care 10/15/2017, 12:29 PM Pager:  [phone] After 3pm call: [phone]  FLOW SHEET  Cultures: Blood 12/10 >> negative Rt pleural fluid 12/14 >> negative Rt pleural fluid 12/18 >> Strep intermedius  Antibiotics: Zithromax 12/10 >> 12/16 Rocephin 12/10 >> 12/15 Unasyn 12/15 >>   Studies: Echo 12/10 >> mild LVH, EF 30 to 35%, grade 2 DD, mild AS, PAS 43 mmHg CT chest 12/13 >> moderate Rt effusion, emphysema Rt thoracentesis 12/14 >> glucose 85, protein 3.7, LDH 984, WBC 1175 (82%N)  Events: 12/10 admit 12/13 CT chest with effusion  12/14 Thoracentesis 12/15 PCCM consult 12/18 IR pigtail catheter placement  12/19 S/p intrapleural altepase and pulmozyme 12/20 Second dose of intrapleural tPA and pulmozyme   Lines/tubes: Rt chest tube 12/18 >>  Consults: 12/16 Palliative care, s/o 12/20 12/18 IR  Resolved Problems:  Labs: CMP Latest Ref Rng & Units 10/15/2017 10/14/2017 10/13/2017  Glucose 65 - 99 mg/dL 101(H) 92 96  BUN 6 - 20 mg/dL 9 10 13   Creatinine 0.61 - 1.24 mg/dL 0.89 0.85 0.87  Sodium 135 - 145 mmol/L 132(L) 133(L) 135  Potassium 3.5 - 5.1 mmol/L 3.8 3.9 3.6  Chloride 101 - 111 mmol/L 98(L) 99(L) 99(L)  CO2 22 - 32 mmol/L 25 26 29   Calcium 8.9 - 10.3 mg/dL 7.8(L) 7.9(L) 7.7(L)  Total Protein 6.5 - 8.1 g/dL 5.4(L) 5.4(L)  5.5(L)  Total Bilirubin 0.3 - 1.2 mg/dL 0.9 0.8 0.4  Alkaline Phos 38 - 126 U/L 58 53 53  AST 15 - 41 U/L 39 38 37  ALT 17 - 63 U/L 34 38 38    CBC Latest Ref Rng & Units 10/15/2017 10/14/2017 10/13/2017  WBC 4.0 - 10.5 K/uL 9.8 13.0(H) 13.7(H)  Hemoglobin 13.0 - 17.0 g/dL 10.7(L) 11.3(L) 11.3(L)  Hematocrit 39.0 - 52.0 % 31.6(L) 33.6(L) 33.1(L)  Platelets 150 - 400 K/uL 235 247 246    CBG (last 3)  No results for input(s): GLUCAP in the last 72 hours.  Imaging: Dg Chest Port 1 View  Result Date: 10/15/2017 CLINICAL DATA:  Pleural effusion EXAM: PORTABLE CHEST 1 VIEW COMPARISON:  10/13/2017 FINDINGS: Left AICD remains in place, unchanged. Right pleural drainage catheter has pulled back into the right chest wall soft tissues. No visible pneumothorax. Moderate right pleural effusion and right lower lobe airspace opacities. Cardiomegaly. Small left effusion. IMPRESSION: Right pigtail pleural drain has pulled out of the pleural space into the right chest wall. No visible pneumothorax. Continued moderate right effusion and right lower lung airspace disease. Small left effusion. Electronically Signed   By: Rolm Baptise M.D.   On: 10/15/2017 07:13

## 2017-10-15 NOTE — Progress Notes (Signed)
Patient ID: [name], male   DOB: April 22, 1931, 83 y.o.   MRN: [id number]  PROGRESS NOTE    [name]  RDE:[id number] DOB: 1930-10-28 DOA: 09/30/2017  PCP: Jesse Space, MD   Brief Narrative:  83 y.o.malewith medical history significant ofchronic combined diastolic and systolic congestive heart failure, CAD, hypertension, hyperlipidemia, vascular disease who presentedto the emergency department with complaints of increased cough and congestion started 2 weeks prior to hospital admission. Chest x-ray demonstrated a right lower lobe pneumonia and right pleural effusion. He did not improve withantibiotics and diuresis.CT chest demonstrated a moderate right-sided pleural effusion without loculation. Heunderwent thoracentesis on 12/14. The fluid was exudative and loculated. Pulmonology and palliative care have been consulted. His antibiotics have been changed to those that can cover anaerobes as he is aspirating.Despite an improvement in his leukocytosis, his chest x-ray continues to look worse. He underwent pigtail catheter placement on 12/18 by IR.  Assessment & Plan:   Acute hypoxemic respiratory failure secondary to pleural effusion and aspiration pneumonia / Loculated right parapneumonic effusion with viridans strep - patient has had lytics and Pulmozyme x2 to assist with draining the effusion - Pleural fluid cultures grew strep viridans sensitive to Unasyn.  However he has loculated pneumothorax with no expansion of his right lower lobe.  VATS procedure would be risky at this time.  Discussed with Dr. Lamonte Sakai.  Chest tube to be DC'd once the drainage becomes less than 50 cc/day - Pigtail catheter placed 12/18 - CXR 12/23 showed right hydropneumothorax with decreasing gas and increased fluid - CXR 12/25 showed right pigtail pleural drain has pulled out of the pleural [name] into the right chest wall. No visible pneumothorax. Continued moderate right effusion and right lower lung  airspace disease - Continue Unasyn - Appreciate pulmonary following  Essential hypertension - Stable   Aspiration - Dysphagia 3 diet   Acute on chronic combined sys and dias chf exacerbation - Most recent 2d echo with EF 30-35% - Presenting BNP of 1348, was 496 on 01/22/17 - Continue Lasix   COPD - Stable   Hx subdural hematoma - Stable   Hypokalemia - Supplemented    DVT prophylaxis: SCD's Code Status: full code  Family Communication: no family at the bedside Disposition Plan: not yet stable for discharge    Consultants:   CCM  Palliative care   Procedures:  Right side chest tube   Antimicrobials:   Unasyn    Subjective: No overnight events.  Objective: Vitals:   10/14/17 0643 10/14/17 1451 10/14/17 2103 10/15/17 0455  BP: 126/65 (!) 122/59 (!) 121/51 (!) 146/76  Pulse: 66 64 65 67  Resp: 18 18 18 18   Temp: 98.9 F (37.2 C) 98.8 F (37.1 C) 98.2 F (36.8 C) 98.8 F (37.1 C)  TempSrc: Oral Oral Oral Oral  SpO2: 97% 100% 100% 100%  Weight: 74.8 kg (164 lb 14.5 oz)   78.5 kg (173 lb)  Height:        Intake/Output Summary (Last 24 hours) at 10/15/2017 0853 Last data filed at 10/15/2017 0600 Gross per 24 hour  Intake 640 ml  Output 800 ml  Net -160 ml   Filed Weights   10/13/17 0432 10/14/17 0643 10/15/17 0455  Weight: 76.5 kg (168 lb 10.4 oz) 74.8 kg (164 lb 14.5 oz) 78.5 kg (173 lb)    Physical Exam  Constitutional: Appears well-developed and well-nourished. No distress.  CVS: RRR, S1/S2 + Pulmonary: diminished breath sounds, no wheezing, has chest tube (+)  Abdominal: Soft. BS +,  no distension, tenderness, rebound or guarding.  Musculoskeletal: Normal range of motion. No edema and no tenderness.  Lymphadenopathy: No lymphadenopathy noted, cervical, inguinal. Neuro: Alert. Normal reflexes, muscle tone coordination. No cranial nerve deficit. Skin: Skin is warm and dry. No rash noted. Not diaphoretic. No erythema. No pallor.    Psychiatric: Normal mood and affect. Behavior, judgment, thought content normal.     Data Reviewed: I have personally reviewed following labs and imaging studies  CBC: Recent Labs  Lab 10/11/17 0447 10/12/17 0535 10/13/17 0550 10/14/17 0440 10/15/17 0418  WBC 15.6* 12.9* 13.7* 13.0* 9.8  HGB 12.2* 11.6* 11.3* 11.3* 10.7*  HCT 35.6* 34.0* 33.1* 33.6* 31.6*  MCV 96.5 95.8 96.2 96.8 96.3  PLT 239 244 246 247 409   Basic Metabolic Panel: Recent Labs  Lab 10/11/17 0447 10/12/17 0535 10/13/17 0550 10/14/17 0440 10/15/17 0418  NA 134* 133* 135 133* 132*  K 3.2* 2.8* 3.6 3.9 3.8  CL 97* 96* 99* 99* 98*  CO2 30 30 29 26 25   GLUCOSE 92 102* 96 92 101*  BUN 15 15 13 10 9   CREATININE 0.95 0.91 0.87 0.85 0.89  CALCIUM 7.9* 7.7* 7.7* 7.9* 7.8*   GFR: Estimated Creatinine Clearance: 65.8 mL/min (by C-G formula based on SCr of 0.89 mg/dL). Liver Function Tests: Recent Labs  Lab 10/11/17 0447 10/12/17 0535 10/13/17 0550 10/14/17 0440 10/15/17 0418  AST 40 33 37 38 39  ALT 50 38 38 38 34  ALKPHOS 58 54 53 53 58  BILITOT 0.8 0.8 0.4 0.8 0.9  PROT 5.7* 5.2* 5.5* 5.4* 5.4*  ALBUMIN 1.8* 1.7* 1.7* 1.7* 1.7*   No results for input(s): LIPASE, AMYLASE in the last 168 hours. No results for input(s): AMMONIA in the last 168 hours. Coagulation Profile: Recent Labs  Lab 10/08/17 1040  INR 1.15   Cardiac Enzymes: No results for input(s): CKTOTAL, CKMB, CKMBINDEX, TROPONINI in the last 168 hours. BNP (last 3 results) Recent Labs    01/22/17 1231  PROBNP 496.0*   HbA1C: No results for input(s): HGBA1C in the last 72 hours. CBG: No results for input(s): GLUCAP in the last 168 hours. Lipid Profile: No results for input(s): CHOL, HDL, LDLCALC, TRIG, CHOLHDL, LDLDIRECT in the last 72 hours. Thyroid Function Tests: No results for input(s): TSH, T4TOTAL, FREET4, T3FREE, THYROIDAB in the last 72 hours. Anemia Panel: No results for input(s): VITAMINB12, FOLATE, FERRITIN,  TIBC, IRON, RETICCTPCT in the last 72 hours. Urine analysis:    Component Value Date/Time   COLORURINE YELLOW 04/14/2016 1028   APPEARANCEUR CLEAR 04/14/2016 1028   LABSPEC 1.013 04/14/2016 1028   PHURINE 6.0 04/14/2016 1028   GLUCOSEU NEGATIVE 04/14/2016 1028   HGBUR NEGATIVE 04/14/2016 1028   BILIRUBINUR NEGATIVE 04/14/2016 1028   KETONESUR NEGATIVE 04/14/2016 1028   PROTEINUR NEGATIVE 04/14/2016 1028   UROBILINOGEN 2.0 (H) 10/28/2012 0820   NITRITE NEGATIVE 04/14/2016 1028   LEUKOCYTESUR NEGATIVE 04/14/2016 1028   Sepsis Labs: @LABRCNTIP (procalcitonin:4,lacticidven:4)   ) Recent Results (from the past 240 hour(s))  Body fluid culture     Status: None   Collection Time: 10/08/17  1:03 PM  Result Value Ref Range Status   Specimen Description PLEURAL RIGHT  Final   Special Requests NONE  Final   Gram Stain   Final    RARE WBC PRESENT, PREDOMINANTLY PMN FEW GRAM POSITIVE COCCI IN PAIRS IN CHAINS RARE GRAM VARIABLE ROD Performed at [facility], [place] [address], [place], [place]  [address]    Culture FEW STREPTOCOCCUS INTERMEDIUS  Final   Report Status 10/11/2017 FINAL  Final   Organism ID, Bacteria STREPTOCOCCUS INTERMEDIUS  Final      Susceptibility   Streptococcus intermedius - MIC*    PENICILLIN 0.12 SENSITIVE Sensitive     CEFTRIAXONE 0.25 SENSITIVE Sensitive     ERYTHROMYCIN <=0.12 SENSITIVE Sensitive     LEVOFLOXACIN 0.5 SENSITIVE Sensitive     VANCOMYCIN 0.5 SENSITIVE Sensitive     AMPICILLIN Value in next row Sensitive      SENSITIVE<=0.25    * FEW STREPTOCOCCUS INTERMEDIUS      Radiology Studies: Dg Chest Port 1 View  Result Date: 10/15/2017 CLINICAL DATA:  Pleural effusion EXAM: PORTABLE CHEST 1 VIEW COMPARISON:  10/13/2017 FINDINGS: Left AICD remains in place, unchanged. Right pleural drainage catheter has pulled back into the right chest wall soft tissues. No visible pneumothorax. Moderate right pleural effusion and right lower lobe airspace  opacities. Cardiomegaly. Small left effusion. IMPRESSION: Right pigtail pleural drain has pulled out of the pleural [name] into the right chest wall. No visible pneumothorax. Continued moderate right effusion and right lower lung airspace disease. Small left effusion. Electronically Signed   By: Rolm Baptise M.D.   On: 10/15/2017 07:13   Dg Chest Port 1 View  Result Date: 10/13/2017 CLINICAL DATA:  Hypoxia EXAM: PORTABLE CHEST 1 VIEW COMPARISON:  Two days ago FINDINGS: Pleural catheter on the right. There is hydropneumothorax at the right base with increasing fluid and decreasing gas component. Hazy appearance of the lower right chest from atelectasis and pleural fluid based on 10/03/2017 CT. Atelectatic type opacity at the left base. Cardiomegaly with dual-chamber pacer/ICD leads. IMPRESSION: Right hydropneumothorax with decreasing gas and increased fluid. Electronically Signed   By: Monte Fantasia M.D.   On: 10/13/2017 06:53   Dg Chest Port 1 View  Result Date: 10/11/2017 CLINICAL DATA:  Follow-up right pneumothorax with pigtail chest tube in place. Follow-up bilateral pleural effusions. EXAM: PORTABLE CHEST 1 VIEW COMPARISON:  10/10/2017, 10/09/2017 and earlier, including CT chest 10/03/2017. FINDINGS: Persistent right lateral pneumothorax, though slightly improved since yesterday. Persistent large right pleural effusion, unchanged, with pigtail catheter in place. Small left pleural effusion, unchanged. Consolidation in the right middle lobe and right lower lobe, unchanged. Mild atelectasis at the left lung base, unchanged. No new pulmonary parenchymal abnormalities. Cardiac silhouette markedly enlarged, unchanged. Left subclavian dual lead pacing defibrillator unchanged. IMPRESSION: 1. Persistent right lateral pneumothorax, slightly improved since yesterday. 2. Stable large right pleural effusion and small left pleural effusion. 3. Stable atelectasis and/or pneumonia involving the right middle lobe and  right lower lobe. Stable mild left basilar atelectasis. 4. No new abnormalities. Electronically Signed   By: Evangeline Dakin M.D.   On: 10/11/2017 09:40    Scheduled Meds: . furosemide  20 mg Oral Daily  . oxybutynin  5 mg Oral BID  . potassium chloride  40 mEq Oral Daily  . sertraline  50 mg Oral QHS   Continuous Infusions: . ampicillin-sulbactam (UNASYN) IV Stopped (10/15/17 0328)     LOS: 15 days    Time spent: 25 minutes  Greater than 50% of the time spent on counseling and coordinating the care.   Jesse Lenz, MD [facility] Pager [phone]  If 7PM-7AM, please contact night-coverage [email or web address] Password [facility] 10/15/2017, 8:53 AM

## 2017-10-15 NOTE — Plan of Care (Signed)
  Completed/Met Coping: Level of anxiety will decrease 10/15/2017 1016 - Completed/Met by Zaydn Gutridge, Royetta Crochet, RN

## 2017-10-16 ENCOUNTER — Inpatient Hospital Stay (HOSPITAL_COMMUNITY): Payer: Medicare Other

## 2017-10-16 LAB — COMPREHENSIVE METABOLIC PANEL
ALBUMIN: 1.8 g/dL — AB (ref 3.5–5.0)
ALK PHOS: 59 U/L (ref 38–126)
ALT: 36 U/L (ref 17–63)
AST: 39 U/L (ref 15–41)
Anion gap: 6 (ref 5–15)
BILIRUBIN TOTAL: 0.3 mg/dL (ref 0.3–1.2)
BUN: 9 mg/dL (ref 6–20)
CALCIUM: 7.9 mg/dL — AB (ref 8.9–10.3)
CO2: 26 mmol/L (ref 22–32)
CREATININE: 0.87 mg/dL (ref 0.61–1.24)
Chloride: 103 mmol/L (ref 101–111)
GFR calc Af Amer: >60 mL/min (ref >60)
GLUCOSE: 95 mg/dL (ref 65–99)
Potassium: 3.6 mmol/L (ref 3.5–5.1)
Sodium: 135 mmol/L (ref 135–145)
TOTAL PROTEIN: 5.7 g/dL — AB (ref 6.5–8.1)

## 2017-10-16 LAB — CBC
HEMATOCRIT: 34 % — AB (ref 39.0–52.0)
Hemoglobin: 11.4 g/dL — ABNORMAL LOW (ref 13.0–17.0)
MCH: 32.7 pg (ref 26.0–34.0)
MCHC: 33.5 g/dL (ref 30.0–36.0)
MCV: 97.4 fL (ref 78.0–100.0)
PLATELETS: 228 10*3/uL (ref 150–400)
RBC: 3.49 MIL/uL — ABNORMAL LOW (ref 4.22–5.81)
RDW: 13.3 % (ref 11.5–15.5)
WBC: 8.7 10*3/uL (ref 4.0–10.5)

## 2017-10-16 NOTE — Progress Notes (Signed)
Physical Therapy Treatment Patient Details Name: [name] MRN: [id number] DOB: 1931/04/26 Today's Date: 10/16/2017    History of Present Illness 83 y.o. male with medical history significant of chronic combined diastolic and systolic congestive heart failure, CAD, hypertension, hyperlipidemia, vascular disease, AICD, COPD and admitted 09/30/17 for Acute hypoxemic respiratory failure suspect due to acute on chronic combined systolic and diastolic CHF, PNA    PT Comments    Pt tolerated increased distance of 75' ambulating with RW today. SaO2 93% on 3L O2 walking.   Follow Up Recommendations  SNF;Supervision/Assistance - 24 hour     Equipment Recommendations  Rolling walker with 5" wheels    Recommendations for Other Services       Precautions / Restrictions Precautions Precautions: Fall;Other (comment) Precaution Comments: chest tube, h/o falls in past year Restrictions Weight Bearing Restrictions: No    Mobility  Bed Mobility Overal bed mobility: Modified Independent Bed Mobility: Supine to Sit     Supine to sit: Modified independent (Device/Increase time)     General bed mobility comments: HOB up 25*, used rail  Transfers Overall transfer level: Needs assistance Equipment used: Rolling walker (2 wheeled) Transfers: Sit to/from Stand Sit to Stand: From elevated surface;Min assist         General transfer comment: min A to rise/steady 2* minor posterior lean initially  Ambulation/Gait Ambulation/Gait assistance: Min guard Ambulation Distance (Feet): 75 Feet Assistive device: Rolling walker (2 wheeled) Gait Pattern/deviations: Step-through pattern;Decreased stride length;Trunk flexed     General Gait Details: ambulated with 3L O2, SaO2 93% on 3 L O2 [place], no LOB, VCs for positioning in RW   Stairs            Wheelchair Mobility    Modified Rankin (Stroke Patients Only)       Balance Overall balance assessment: Needs  assistance Sitting-balance support: Feet supported Sitting balance-Leahy Scale: Good   Postural control: Posterior lean Standing balance support: Bilateral upper extremity supported Standing balance-Leahy Scale: Poor Standing balance comment: requires BUE support                            Cognition Arousal/Alertness: Awake/alert Behavior During Therapy: WFL for tasks assessed/performed Overall Cognitive Status: Within Functional Limits for tasks assessed                                        Exercises      General Comments        Pertinent Vitals/Pain Pain Assessment: 0-10 Pain Score: 0-No pain    Home Living                      Prior Function            PT Goals (current goals can now be found in the care plan section) Acute Rehab PT Goals PT Goal Formulation: With patient/family Time For Goal Achievement: 10/17/17 Potential to Achieve Goals: Good Progress towards PT goals: Progressing toward goals    Frequency    Min 3X/week      PT Plan Current plan remains appropriate    Co-evaluation              AM-PAC PT "6 Clicks" Daily Activity  Outcome Measure  Difficulty turning over in bed (including adjusting bedclothes, sheets and blankets)?: None Difficulty moving from lying on back  to sitting on the side of the bed? : A Little Difficulty sitting down on and standing up from a chair with arms (e.g., wheelchair, bedside commode, etc,.)?: A Little Help needed moving to and from a bed to chair (including a wheelchair)?: A Little Help needed walking in hospital room?: A Little Help needed climbing 3-5 steps with a railing? : A Lot 6 Click Score: 18    End of Session Equipment Utilized During Treatment: Gait belt Activity Tolerance: Patient tolerated treatment well Patient left: in chair;with call bell/phone within reach Nurse Communication: Mobility status PT Visit Diagnosis: Difficulty in walking, not  elsewhere classified (R26.2)     Time: 4332-9518 PT Time Calculation (min) (ACUTE ONLY): 13 min  Charges:  $Gait Training: 8-22 mins                    G Codes:          Philomena Doheny 10/16/2017, 10:46 AM [phone]

## 2017-10-16 NOTE — Progress Notes (Signed)
Initial Nutrition Assessment  DOCUMENTATION CODES:   Not applicable  INTERVENTION:   Magic cup TID with meals, each supplement provides 290 kcal and 9 grams of protein cup  NUTRITION DIAGNOSIS:   Swallowing difficulty related to dysphagia as evidenced by other (comment)(SLP reports ).  GOAL:   Patient will meet greater than or equal to 90% of their needs  MONITOR:   PO intake, Weight trends, Diet advancement, Supplement acceptance, Labs  REASON FOR ASSESSMENT:   Malnutrition Screening Tool    ASSESSMENT:   Pt with PMH significant for CHF, CAD, HTN, HLD, COPD, PVD, and diverticulosis. Presents this admission with acute respiratory failure secondary to pleural effusion and aspiration PNA. SLP saw pt 12/24, recommends DYS 3 diet with nectar thick liquids. Plan for repeat MBS in one week.    Spoke with pt at bedside who reports having off and on appetite this admission. When asked about his appetite PTA pt states "it's been so long, I cant remember." Meal completions charted as 42% average for his last 8 meals. Pt unaware of any swallowing issues. Denies coughing upon eating. Denies taste changes. Pt does not consume supplementation at home, but amenable this stay. RD to provide nectar thick supplement.   Pt's reported UBW stays around 170 lb. Records indicate weights have fluctuated throughout the year. With pt's history of CHF, hard to quantify dry weight loss.   Nutrition-Focused physical exam completed.  Medications reviewed and include: lasix, IV abx Labs reviewed.   NUTRITION - FOCUSED PHYSICAL EXAM:    Most Recent Value  Orbital Region  No depletion  Upper Arm Region  No depletion  Thoracic and Lumbar Region  Unable to assess  Buccal Region  No depletion  Temple Region  Moderate depletion  Clavicle Bone Region  Moderate depletion  Clavicle and Acromion Bone Region  Moderate depletion  Scapular Bone Region  Unable to assess  Dorsal Hand  Mild depletion  Patellar  Region  Moderate depletion  Anterior Thigh Region  Moderate depletion  Posterior Calf Region  Moderate depletion  Edema (RD Assessment)  Mild  Hair  Reviewed  Eyes  Reviewed  Mouth  Reviewed  Skin  Reviewed  Nails  Reviewed      Diet Order:  DIET DYS 3 Room service appropriate? Yes; Fluid consistency: Nectar Thick  EDUCATION NEEDS:   Not appropriate for education at this time  Skin:  Skin Assessment: Skin Integrity Issues: Skin Integrity Issues:: Other (Comment), Incisions Incisions: abdomen Other: deep tissues- abdomen  Last BM:  10/15/17  Height:   Ht Readings from Last 1 Encounters:  10/04/17 5\' 11"  (1.803 m)    Weight:   Wt Readings from Last 1 Encounters:  10/16/17 168 lb 10.4 oz (76.5 kg)    Ideal Body Weight:  78.2 kg  BMI:  Body mass index is 23.52 kg/m.  Estimated Nutritional Needs:   Kcal:  2000-2200 kcal/day  Protein:  100-110 g/day  Fluid:  >2 L/day    Mariana Single RD, LDN Clinical Nutrition Pager # [phone]

## 2017-10-16 NOTE — Progress Notes (Signed)
Referring Physician(s): Byrum,R  Supervising Physician: Corrie Mckusick  Patient Status:  [facility] - In-pt  Chief Complaint:  Loculated right pleural effusion/empyema  Subjective: Pt feeling about the same; denies worsening CP; baseline dyspnea, occ cough   Allergies: Lisinopril  Medications: Prior to Admission medications   Medication Sig Start Date End Date Taking? Authorizing Provider  albuterol (PROVENTIL HFA;VENTOLIN HFA) 108 (90 Base) MCG/ACT inhaler Inhale 2 puffs into the lungs every 6 (six) hours as needed for wheezing or shortness of breath. 01/14/17  Yes Rizwan, Eunice Blase, MD  allopurinol (ZYLOPRIM) 100 MG tablet TAKE 1 TABLET ONCE DAILY. 09/16/17  Yes Noralee Space, MD  carvedilol (COREG) 3.125 MG tablet TAKE 1 TABLET TWICE DAILY WITH A MEAL. 06/10/17  Yes Minus Breeding, MD  cholecalciferol (VITAMIN D) 1000 units tablet Take 1,000 Units by mouth daily.   Yes [provider]  clopidogrel (PLAVIX) 75 MG tablet TAKE 1 TABLET ONCE DAILY. 06/25/17  Yes Noralee Space, MD  Fluticasone-Salmeterol [facility] RESPICLICK 956/38) 756-43 MCG/ACT AEPB Inhale 2 puffs into the lungs 2 (two) times daily. 09/18/17  Yes Noralee Space, MD  furosemide (LASIX) 40 MG tablet TAKE 1 TABLET DAILY AS NEEDED. (FOR WEIGHT GAIN) Patient taking differently: Take one tablet by mouth every other day. 06/25/17  Yes Noralee Space, MD  guaiFENesin (MUCINEX) 600 MG 12 hr tablet Take 600-1,200 mg by mouth 2 (two) times daily. Takes two in the morning and one in the evening.   Yes [provider]  levothyroxine (SYNTHROID, LEVOTHROID) 25 MCG tablet TAKE 1 TABLET IN THE MORNING ON AN EMPTY STOMACH. 07/08/17  Yes Noralee Space, MD  magnesium oxide (MAG-OX) 400 (241.3 Mg) MG tablet Take 0.5 tablets (200 mg total) by mouth daily. 04/27/16  Yes Love, Ivan Anchors, PA-C  Multiple Vitamin (MULTIVITAMIN) capsule Take 1 capsule by mouth daily.   Yes [provider]  Multiple Vitamins-Minerals  (PRESERVISION/LUTEIN) CAPS Take 1 capsule by mouth 2 (two) times daily.    Yes [provider]  oxybutynin (DITROPAN) 5 MG tablet TAKE 1 TABLET BY MOUTH TWICE DAILY. 09/02/17  Yes Noralee Space, MD  polyethylene glycol powder (GLYCOLAX/MIRALAX) powder DISSOLVE ONE CAPFUL IN 8 0Z. WATER TWICE DAILY. 07/23/17  Yes Noralee Space, MD  potassium chloride SA (K-DUR,KLOR-CON) 20 MEQ tablet TAKE 1 TABLET DAILY AS NEEDED. (TAKE EACH TIME YOU TAKE FUROSEMIDE) Patient taking differently: Take one tablet by mouth every other day. 04/29/17  Yes Noralee Space, MD  pravastatin (PRAVACHOL) 40 MG tablet TAKE 1 TABLET ONCE DAILY. 08/13/17  Yes Noralee Space, MD  sertraline (ZOLOFT) 50 MG tablet TAKE ONE TABLET AT BEDTIME. 09/02/17  Yes Noralee Space, MD  thiamine 100 MG tablet Take 1 tablet (100 mg total) by mouth daily. 04/26/16  Yes Love, Ivan Anchors, PA-C  ADVAIR DISKUS 250-50 MCG/DOSE AEPB Inhale 1 puff into the lungs 2 (two) times daily. 07/23/17   [provider]  alendronate (FOSAMAX) 70 MG tablet Take 1 tablet (70 mg total) by mouth once a week. Take with a full glass of water on an empty stomach. 07/10/17   Noralee Space, MD  amiodarone (PACERONE) 200 MG tablet TAKE 1 TABLET ONCE DAILY. 10/02/17   Noralee Space, MD  clotrimazole-betamethasone (LOTRISONE) cream Apply 1 application topically 2 (two) times daily. Patient not taking: Reported on 09/30/2017 06/18/17   Noralee Space, MD  traMADol (ULTRAM) 50 MG tablet Take 1 tablet (50 mg total) by mouth every  6 (six) hours as needed for severe pain. 10/14/17   Robbie Lis, MD     Vital Signs: BP 107/61 (BP Location: Right Arm)   Pulse 71   Temp 98 F (36.7 C) (Oral)   Resp 18   Ht 5\' 11"  (1.803 m)   Wt 168 lb 10.4 oz (76.5 kg)   SpO2 96%   BMI 23.52 kg/m   Physical Exam rt chest drain retracted into soft tissues and kinked at entry site on exam today; sutures intact; tube removed in its entirety  Imaging: Ct Chest Wo  Contrast  Result Date: 10/15/2017 CLINICAL DATA:  Follow-up pleural effusion. Extubated pleural effusion on the right. EXAM: CT CHEST WITHOUT CONTRAST TECHNIQUE: Multidetector CT imaging of the chest was performed following the standard protocol without IV contrast. COMPARISON:  10/03/2017 chest CT FINDINGS: Cardiovascular: Chronic cardiomegaly. Dual-chamber ICD/pacer from the left. Extensive atherosclerotic calcification of the aorta and coronaries. Mediastinum/Nodes: Negative for adenopathy or abscess. Lungs/Pleura: Pigtail pleural catheter on the right has been retracted into the subcutaneous fat. Right pleural effusion seen on prior has diminished, but shows progressive pleural thickening with scattered gas bubbles. Most of the remaining fluid is posterior and lateral, with some visual fluid superiorly. Right middle and lower lobe atelectasis with low-density areas likely reflecting necrotic pneumonia in this setting. Mild centrilobular emphysema. No edema. Central airways are now patent. There is a small web in the right bronchus intermedius. Upper Abdomen: No acute finding. Musculoskeletal: No acute or aggressive finding. Remote posterior right rib fractures. Remote T12 compression fracture with cement augmentation. T10 inferior endplate irregularity that is stable from 2017 and likely degenerative. Similar superior endplate changes seen at T3. There is scoliosis with T1-2 non segmentation. IMPRESSION: 1. Exudative right pleural effusion has significantly decreased in size compared to 10/03/2017, but there is still moderate volume fluid. The pleura shows progressive inflammatory thickening. There is new scattered gas which could be from infection or drainage catheter. 2. Pigtail pleural catheter has been retracted into the subcutaneous fat of the right chest wall. 3. Improved aeration of the right middle and lower lobes. Cystic density foci in the right middle and lower lobes, favor sequela of cavitating  pneumonia in this setting. 4. Central airway debris seen previously is significantly cleared. The bronchus intermedius is narrowed by a web-like defect. Electronically Signed   By: Monte Fantasia M.D.   On: 10/15/2017 16:50   Dg Chest Port 1 View  Result Date: 10/15/2017 CLINICAL DATA:  Pleural effusion EXAM: PORTABLE CHEST 1 VIEW COMPARISON:  10/13/2017 FINDINGS: Left AICD remains in place, unchanged. Right pleural drainage catheter has pulled back into the right chest wall soft tissues. No visible pneumothorax. Moderate right pleural effusion and right lower lobe airspace opacities. Cardiomegaly. Small left effusion. IMPRESSION: Right pigtail pleural drain has pulled out of the pleural space into the right chest wall. No visible pneumothorax. Continued moderate right effusion and right lower lung airspace disease. Small left effusion. Electronically Signed   By: Rolm Baptise M.D.   On: 10/15/2017 07:13   Dg Chest Port 1 View  Result Date: 10/13/2017 CLINICAL DATA:  Hypoxia EXAM: PORTABLE CHEST 1 VIEW COMPARISON:  Two days ago FINDINGS: Pleural catheter on the right. There is hydropneumothorax at the right base with increasing fluid and decreasing gas component. Hazy appearance of the lower right chest from atelectasis and pleural fluid based on 10/03/2017 CT. Atelectatic type opacity at the left base. Cardiomegaly with dual-chamber pacer/ICD leads. IMPRESSION: Right hydropneumothorax with decreasing  gas and increased fluid. Electronically Signed   By: Monte Fantasia M.D.   On: 10/13/2017 06:53    Labs:  CBC: Recent Labs    10/13/17 0550 10/14/17 0440 10/15/17 0418 10/16/17 0433  WBC 13.7* 13.0* 9.8 8.7  HGB 11.3* 11.3* 10.7* 11.4*  HCT 33.1* 33.6* 31.6* 34.0*  PLT 246 247 235 228    COAGS: Recent Labs    10/08/17 1040  INR 1.15    BMP: Recent Labs    10/13/17 0550 10/14/17 0440 10/15/17 0418 10/16/17 0433  NA 135 133* 132* 135  K 3.6 3.9 3.8 3.6  CL 99* 99* 98* 103   CO2 29 26 25 26   GLUCOSE 96 92 101* 95  BUN 13 10 9 9   CALCIUM 7.7* 7.9* 7.8* 7.9*  CREATININE 0.87 0.85 0.89 0.87  GFRNONAA >60 >60 >60 >60  GFRAA >60 >60 >60 >60    LIVER FUNCTION TESTS: Recent Labs    10/13/17 0550 10/14/17 0440 10/15/17 0418 10/16/17 0433  BILITOT 0.4 0.8 0.9 0.3  AST 37 38 39 39  ALT 38 38 34 36  ALKPHOS 53 53 58 59  PROT 5.5* 5.4* 5.4* 5.7*  ALBUMIN 1.7* 1.7* 1.7* 1.8*    Assessment and Plan: Pt s/p drainage of loculated rt pleural effusion/empyema; afebrile;BP ok; fluid cx- strept intermedius; afebrile; WBCnl; hgb 11.4; creat nl; rt chest drain has retracted into SQ fat of chest wall on latest imaging; drain removed in its entirety without immediate complications; vaseline gauze applied over site; size of right effusion has decreased but still present on CT with assoc pleural thickening; additional plans as per CCM     Electronically Signed: D. Rowe Robert, PA-C 10/16/2017, 9:26 AM   I spent a total of 15 minutes at the the patient's bedside AND on the patient's hospital floor or unit, greater than 50% of which was counseling/coordinating care for right chest drain    Patient ID: [name], male   DOB: 03-19-1931, 83 y.o.   MRN: [id number]

## 2017-10-16 NOTE — Progress Notes (Signed)
[facility] P[facility] PROGRESS NOTE  Date of Admission: 09/30/2017 Date of Consult: 10/05/2017 Referring Provider: Dr. Sheran Fava, [facility] Chief Complaint: Short of breath  HPI: 83 yo male presented with dyspnea x 2 weeks.  Treated for pneumonia.  Found to have Rt effusion.    Past Medical History: He  has a past medical history of AAA (abdominal aortic aneurysm) ([place]), Allergic rhinitis, Back pain, CAD (coronary artery disease), Carotid arterial disease ([place]), Chronic combined systolic and diastolic CHF (congestive heart failure) ([place]), Compression fracture, COPD (chronic obstructive pulmonary disease) ([place]), Dermatitis, Diverticulosis of colon, DJD (degenerative joint disease), Hemorrhoids, History of colonic polyps, History of gout, History of pneumonia, Hypercholesterolemia, Hypertension, Hypertensive heart disease, Ischemic cardiomyopathy, Osteoporosis, Peripheral vascular disease ([place]), Presence of cardiac defibrillator, Skin cancer, Syncope, T wave over sensing resulting in inappropriate shocks, Tobacco abuse, and Ventricular tachycardia ([place]) (04/01/2016).  Subjective: Breathing okay.  Not having much cough.  Vital signs: BP 107/61 (BP Location: Right Arm)   Pulse 71   Temp 98 F (36.7 C) (Oral)   Resp 18   Ht 5\' 11"  (1.803 m)   Wt 168 lb 10.4 oz (76.5 kg)   SpO2 96%   BMI 23.52 kg/m   Intake/Output: I/O last 3 completed shifts: In: 52 [P.O.:180; IV Piggyback:600] Out: 29 [Urine:800; Stool:1]  Physical Exam:  General - pleasant Eyes - pupils reactive ENT - no sinus tenderness, no oral exudate, no LAN Cardiac - regular, no murmur Chest - no wheeze, rales Abd - soft, non tender Ext - no edema Skin - no rashes Neuro - normal strength Psych - normal mood  Discussion: 83 yo male with aspiration pneumonia and Rt empyema with Strep intermedius in pleural fluid culture.  Not a surgical candidate given age and comorbid conditions.  Had good amount of drainage after chest tube insertion  by IR and tPA/pulmozyme therapy.  CXR 12/25 showed chest tube outside pleural space, and removed 12/26.  CT chest shows improvement in pleural space, but has pleural thickening.  Also has some residual fluid, but don't think he needs reinsertion of chest tube at this time.  Assessment/Plan:  Aspiration pneumonia with Rt emphysema. - day 12/21 of unasyn. - f/u 2 view CXR 12/27 - if CXR stable, then can transition to augmentin  COPD with emphysema. - prn BDs  Deconditioning. - PT recommending SNF  Acute on chronic combined CHF. - per primary team  DVT prophylaxis: SCDs SUP: Not indicated Diet: D3 diet Goals of care: Full code  Chesley Mires, MD [facility] Pulmonary/Critical Care 10/16/2017, 10:26 AM Pager:  [phone] After 3pm call: [phone]  FLOW SHEET  Cultures: Blood 12/10 >> negative Rt pleural fluid 12/14 >> negative Rt pleural fluid 12/18 >> Strep intermedius  Antibiotics: Zithromax 12/10 >> 12/16 Rocephin 12/10 >> 12/15 Unasyn 12/15 >>   Studies: Echo 12/10 >> mild LVH, EF 30 to 35%, grade 2 DD, mild AS, PAS 43 mmHg CT chest 12/13 >> moderate Rt effusion, emphysema Rt thoracentesis 12/14 >> glucose 85, protein 3.7, LDH 984, WBC 1175 (82%N) CT chest 12/25 >> decreased Rt pleural effusion, pleural thickening  Events: 12/10 admit 12/13 CT chest with effusion  12/14 Thoracentesis 12/15 PCCM consult 12/18 IR pigtail catheter placement  12/19 S/p intrapleural altepase and pulmozyme 12/20 Second dose of intrapleural tPA and pulmozyme   Lines/tubes: Rt chest tube 12/18 >> 12/26  Consults: 12/16 Palliative care, s/o 12/20 12/18 IR  Resolved Problems:  Labs: CMP Latest Ref Rng & Units 10/16/2017 10/15/2017 10/14/2017  Glucose 65 -  99 mg/dL 95 101(H) 92  BUN 6 - 20 mg/dL 9 9 10   Creatinine 0.61 - 1.24 mg/dL 0.87 0.89 0.85  Sodium 135 - 145 mmol/L 135 132(L) 133(L)  Potassium 3.5 - 5.1 mmol/L 3.6 3.8 3.9  Chloride 101 - 111 mmol/L 103 98(L) 99(L)  CO2 22  - 32 mmol/L 26 25 26   Calcium 8.9 - 10.3 mg/dL 7.9(L) 7.8(L) 7.9(L)  Total Protein 6.5 - 8.1 g/dL 5.7(L) 5.4(L) 5.4(L)  Total Bilirubin 0.3 - 1.2 mg/dL 0.3 0.9 0.8  Alkaline Phos 38 - 126 U/L 59 58 53  AST 15 - 41 U/L 39 39 38  ALT 17 - 63 U/L 36 34 38    CBC Latest Ref Rng & Units 10/16/2017 10/15/2017 10/14/2017  WBC 4.0 - 10.5 K/uL 8.7 9.8 13.0(H)  Hemoglobin 13.0 - 17.0 g/dL 11.4(L) 10.7(L) 11.3(L)  Hematocrit 39.0 - 52.0 % 34.0(L) 31.6(L) 33.6(L)  Platelets 150 - 400 K/uL 228 235 247    CBG (last 3)  No results for input(s): GLUCAP in the last 72 hours.  Imaging: Ct Chest Wo Contrast  Result Date: 10/15/2017 CLINICAL DATA:  Follow-up pleural effusion. Extubated pleural effusion on the right. EXAM: CT CHEST WITHOUT CONTRAST TECHNIQUE: Multidetector CT imaging of the chest was performed following the standard protocol without IV contrast. COMPARISON:  10/03/2017 chest CT FINDINGS: Cardiovascular: Chronic cardiomegaly. Dual-chamber ICD/pacer from the left. Extensive atherosclerotic calcification of the aorta and coronaries. Mediastinum/Nodes: Negative for adenopathy or abscess. Lungs/Pleura: Pigtail pleural catheter on the right has been retracted into the subcutaneous fat. Right pleural effusion seen on prior has diminished, but shows progressive pleural thickening with scattered gas bubbles. Most of the remaining fluid is posterior and lateral, with some visual fluid superiorly. Right middle and lower lobe atelectasis with low-density areas likely reflecting necrotic pneumonia in this setting. Mild centrilobular emphysema. No edema. Central airways are now patent. There is a small web in the right bronchus intermedius. Upper Abdomen: No acute finding. Musculoskeletal: No acute or aggressive finding. Remote posterior right rib fractures. Remote T12 compression fracture with cement augmentation. T10 inferior endplate irregularity that is stable from 2017 and likely degenerative. Similar  superior endplate changes seen at T3. There is scoliosis with T1-2 non segmentation. IMPRESSION: 1. Exudative right pleural effusion has significantly decreased in size compared to 10/03/2017, but there is still moderate volume fluid. The pleura shows progressive inflammatory thickening. There is new scattered gas which could be from infection or drainage catheter. 2. Pigtail pleural catheter has been retracted into the subcutaneous fat of the right chest wall. 3. Improved aeration of the right middle and lower lobes. Cystic density foci in the right middle and lower lobes, favor sequela of cavitating pneumonia in this setting. 4. Central airway debris seen previously is significantly cleared. The bronchus intermedius is narrowed by a web-like defect. Electronically Signed   By: Monte Fantasia M.D.   On: 10/15/2017 16:50   Dg Chest Port 1 View  Result Date: 10/15/2017 CLINICAL DATA:  Pleural effusion EXAM: PORTABLE CHEST 1 VIEW COMPARISON:  10/13/2017 FINDINGS: Left AICD remains in place, unchanged. Right pleural drainage catheter has pulled back into the right chest wall soft tissues. No visible pneumothorax. Moderate right pleural effusion and right lower lobe airspace opacities. Cardiomegaly. Small left effusion. IMPRESSION: Right pigtail pleural drain has pulled out of the pleural space into the right chest wall. No visible pneumothorax. Continued moderate right effusion and right lower lung airspace disease. Small left effusion. Electronically Signed   By:  Rolm Baptise M.D.   On: 10/15/2017 07:13

## 2017-10-16 NOTE — Progress Notes (Signed)
CSW following to assist with disposition.  Spoke with pt's daughter [name] today- she advises that family wants pt to go to [facility] [place]. CSW reviewed CIR note and discussed with daughter that SNF being recommended. Daughter understanding and states, "He had a wonderful experience at [facility] before, however his wife was in a SNF and had a stroke there and generally it was a very bad experience. We would like to plan to take him home and have my sister stay with him and have home health and caretakers rather than go to SNF. We would only agree as a last resort." CSW explained Pt has SNF bed offers- daughter agreed to consider them "only if it comes to that. We want to hear definitely from CIR and then try home health first." Will follow.  Sharren Bridge, MSW, LCSW Clinical Social Work 10/16/2017 [phone]

## 2017-10-16 NOTE — Progress Notes (Signed)
Patient ID: [name], male   DOB: 05/01/1931, 83 y.o.   MRN: [id number]  PROGRESS NOTE    [name]  XBD:[id number] DOB: [birth year] DOA: 09/30/2017  PCP: Noralee Space, MD   Brief Narrative:  83 y.o.malewith medical history significant ofchronic combined diastolic and systolic congestive heart failure, CAD, hypertension, hyperlipidemia, vascular disease who presentedto the emergency department with complaints of increased cough and congestion started 2 weeks prior to hospital admission. Chest x-ray demonstrated a right lower lobe pneumonia and right pleural effusion. He did not improve withantibiotics and diuresis.CT chest demonstrated a moderate right-sided pleural effusion without loculation. Heunderwent thoracentesis on 12/14. The fluid was exudative and loculated. Pulmonology and palliative care have been consulted. His antibiotics have been changed to those that can cover anaerobes as he is aspirating.Despite an improvement in his leukocytosis, his chest x-ray continues to look worse. He underwent pigtail catheter placement on 12/18 by IR.  Assessment & Plan:   Acute hypoxemic respiratory failure secondary to pleural effusion and aspiration pneumonia / Loculated right parapneumonic effusion with viridans strep - patient has had lytics and Pulmozyme x2 to assist with draining the effusion - Pleural fluid cultures grew strep viridans sensitive to Unasyn.  However he has loculated pneumothorax with no expansion of his right lower lobe.  VATS procedure would be risky at this time - Pigtail catheter placed 12/18 - CXR 12/23 showed right hydropneumothorax with decreasing gas and increased fluid - CXR 12/25 showed right pigtail pleural drain has pulled out of the pleural space into the right chest wall. No visible pneumothorax. Continued moderate right effusion and right lower lung airspace disease - Chest tube out today - Appreciate pulmonary following   Essential  hypertension - Stable   Aspiration - Dysphagia 3 diet   Acute on chronic combined sys and dias chf exacerbation - Most recent 2d echo with EF 30-35% - Presenting BNP of 1348, was 496 on 01/22/17 - Continue Lasix   COPD - Stable   Hx subdural hematoma - Stable   Hypokalemia - Supplemented    DVT prophylaxis: SCD's Code Status: full code  Family Communication: no family at bedside  Disposition Plan: d/c once clear by pulm    Consultants:   CCM  Palliative care   Procedures:  Right side chest tube   Antimicrobials:   Unasyn    Subjective: No overnight events.  Objective: Vitals:   10/15/17 1343 10/15/17 2032 10/16/17 0445 10/16/17 1546  BP: (!) 120/55 127/72 107/61 (!) 114/59  Pulse: 63 62 71 66  Resp: 18 18 18 18   Temp: 98.2 F (36.8 C) 98.6 F (37 C) 98 F (36.7 C) 98.6 F (37 C)  TempSrc: Oral Oral Oral Oral  SpO2: 100% 100% 96% 99%  Weight:   76.5 kg (168 lb 10.4 oz)   Height:        Intake/Output Summary (Last 24 hours) at 10/16/2017 1743 Last data filed at 10/16/2017 1359 Gross per 24 hour  Intake 880 ml  Output 251 ml  Net 629 ml   Filed Weights   10/14/17 0643 10/15/17 0455 10/16/17 0445  Weight: 74.8 kg (164 lb 14.5 oz) 78.5 kg (173 lb) 76.5 kg (168 lb 10.4 oz)    Physical Exam  Constitutional: Appears well-developed and well-nourished. No distress.  CVS: RRR, S1/S2 + Pulmonary: no wheezing, no rhonchi  Abdominal: Soft. BS +,  no distension, tenderness, rebound or guarding.  Musculoskeletal: Normal range of motion. No edema and no tenderness.  Lymphadenopathy:  No lymphadenopathy noted, cervical, inguinal. Neuro: Alert. Normal reflexes, muscle tone coordination. No cranial nerve deficit. Skin: Skin is warm and dry.  Psychiatric: Normal mood and affect. Behavior, judgment, thought content normal.   Data Reviewed: I have personally reviewed following labs and imaging studies  CBC: Recent Labs  Lab 10/12/17 0535 10/13/17 0550  10/14/17 0440 10/15/17 0418 10/16/17 0433  WBC 12.9* 13.7* 13.0* 9.8 8.7  HGB 11.6* 11.3* 11.3* 10.7* 11.4*  HCT 34.0* 33.1* 33.6* 31.6* 34.0*  MCV 95.8 96.2 96.8 96.3 97.4  PLT 244 246 247 235 025   Basic Metabolic Panel: Recent Labs  Lab 10/12/17 0535 10/13/17 0550 10/14/17 0440 10/15/17 0418 10/16/17 0433  NA 133* 135 133* 132* 135  K 2.8* 3.6 3.9 3.8 3.6  CL 96* 99* 99* 98* 103  CO2 30 29 26 25 26   GLUCOSE 102* 96 92 101* 95  BUN 15 13 10 9 9   CREATININE 0.91 0.87 0.85 0.89 0.87  CALCIUM 7.7* 7.7* 7.9* 7.8* 7.9*   GFR: Estimated Creatinine Clearance: 67.3 mL/min (by C-G formula based on SCr of 0.87 mg/dL). Liver Function Tests: Recent Labs  Lab 10/12/17 0535 10/13/17 0550 10/14/17 0440 10/15/17 0418 10/16/17 0433  AST 33 37 38 39 39  ALT 38 38 38 34 36  ALKPHOS 54 53 53 58 59  BILITOT 0.8 0.4 0.8 0.9 0.3  PROT 5.2* 5.5* 5.4* 5.4* 5.7*  ALBUMIN 1.7* 1.7* 1.7* 1.7* 1.8*   No results for input(s): LIPASE, AMYLASE in the last 168 hours. No results for input(s): AMMONIA in the last 168 hours. Coagulation Profile: No results for input(s): INR, PROTIME in the last 168 hours. Cardiac Enzymes: No results for input(s): CKTOTAL, CKMB, CKMBINDEX, TROPONINI in the last 168 hours. BNP (last 3 results) Recent Labs    01/22/17 1231  PROBNP 496.0*   HbA1C: No results for input(s): HGBA1C in the last 72 hours. CBG: No results for input(s): GLUCAP in the last 168 hours. Lipid Profile: No results for input(s): CHOL, HDL, LDLCALC, TRIG, CHOLHDL, LDLDIRECT in the last 72 hours. Thyroid Function Tests: No results for input(s): TSH, T4TOTAL, FREET4, T3FREE, THYROIDAB in the last 72 hours. Anemia Panel: No results for input(s): VITAMINB12, FOLATE, FERRITIN, TIBC, IRON, RETICCTPCT in the last 72 hours. Urine analysis:    Component Value Date/Time   COLORURINE YELLOW 04/14/2016 1028   APPEARANCEUR CLEAR 04/14/2016 1028   LABSPEC 1.013 04/14/2016 1028   PHURINE 6.0  04/14/2016 1028   GLUCOSEU NEGATIVE 04/14/2016 1028   HGBUR NEGATIVE 04/14/2016 1028   BILIRUBINUR NEGATIVE 04/14/2016 1028   KETONESUR NEGATIVE 04/14/2016 1028   PROTEINUR NEGATIVE 04/14/2016 1028   UROBILINOGEN 2.0 (H) 10/28/2012 0820   NITRITE NEGATIVE 04/14/2016 1028   LEUKOCYTESUR NEGATIVE 04/14/2016 1028   Sepsis Labs: @LABRCNTIP (procalcitonin:4,lacticidven:4)   ) Recent Results (from the past 240 hour(s))  Body fluid culture     Status: None   Collection Time: 10/08/17  1:03 PM  Result Value Ref Range Status   Specimen Description PLEURAL RIGHT  Final   Special Requests NONE  Final   Gram Stain   Final    RARE WBC PRESENT, PREDOMINANTLY PMN FEW GRAM POSITIVE COCCI IN PAIRS IN CHAINS RARE GRAM VARIABLE ROD Performed at [facility], [place] [address] [place], [place] [address]    Culture FEW STREPTOCOCCUS INTERMEDIUS  Final   Report Status 10/11/2017 FINAL  Final   Organism ID, Bacteria STREPTOCOCCUS INTERMEDIUS  Final      Susceptibility   Streptococcus  intermedius - MIC*    PENICILLIN 0.12 SENSITIVE Sensitive     CEFTRIAXONE 0.25 SENSITIVE Sensitive     ERYTHROMYCIN <=0.12 SENSITIVE Sensitive     LEVOFLOXACIN 0.5 SENSITIVE Sensitive     VANCOMYCIN 0.5 SENSITIVE Sensitive     AMPICILLIN Value in next row Sensitive      SENSITIVE<=0.25    * FEW STREPTOCOCCUS INTERMEDIUS      Radiology Studies: Ct Chest Wo Contrast  Result Date: 10/15/2017 CLINICAL DATA:  Follow-up pleural effusion. Extubated pleural effusion on the right. EXAM: CT CHEST WITHOUT CONTRAST TECHNIQUE: Multidetector CT imaging of the chest was performed following the standard protocol without IV contrast. COMPARISON:  10/03/2017 chest CT FINDINGS: Cardiovascular: Chronic cardiomegaly. Dual-chamber ICD/pacer from the left. Extensive atherosclerotic calcification of the aorta and coronaries. Mediastinum/Nodes: Negative for adenopathy or abscess. Lungs/Pleura: Pigtail pleural catheter on the right has  been retracted into the subcutaneous fat. Right pleural effusion seen on prior has diminished, but shows progressive pleural thickening with scattered gas bubbles. Most of the remaining fluid is posterior and lateral, with some visual fluid superiorly. Right middle and lower lobe atelectasis with low-density areas likely reflecting necrotic pneumonia in this setting. Mild centrilobular emphysema. No edema. Central airways are now patent. There is a small web in the right bronchus intermedius. Upper Abdomen: No acute finding. Musculoskeletal: No acute or aggressive finding. Remote posterior right rib fractures. Remote T12 compression fracture with cement augmentation. T10 inferior endplate irregularity that is stable from 2017 and likely degenerative. Similar superior endplate changes seen at T3. There is scoliosis with T1-2 non segmentation. IMPRESSION: 1. Exudative right pleural effusion has significantly decreased in size compared to 10/03/2017, but there is still moderate volume fluid. The pleura shows progressive inflammatory thickening. There is new scattered gas which could be from infection or drainage catheter. 2. Pigtail pleural catheter has been retracted into the subcutaneous fat of the right chest wall. 3. Improved aeration of the right middle and lower lobes. Cystic density foci in the right middle and lower lobes, favor sequela of cavitating pneumonia in this setting. 4. Central airway debris seen previously is significantly cleared. The bronchus intermedius is narrowed by a web-like defect. Electronically Signed   By: Monte Fantasia M.D.   On: 10/15/2017 16:50   Dg Chest Port 1 View  Result Date: 10/16/2017 CLINICAL DATA:  83 year old male with weakness cough congestion and shortness of breath. Exudative right pleural effusion and evidence of cavitary pneumonia in the right middle and lower lobes on chest CT yesterday. EXAM: PORTABLE CHEST 1 VIEW COMPARISON:  Chest CT 10/15/2017 and earlier.  FINDINGS: Portable AP semi upright view at 1610 hours. Stable lung volumes. Stable cardiac size and mediastinal contours. Stable left chest AICD. The left lung remains clear when allowing for portable technique. Right mid and lower lung mixed patchy opacity and lucency is stable since yesterday. No pneumothorax is evident radiographically. Calcified aorta and right subclavian atherosclerosis. Negative visible bowel gas pattern. IMPRESSION: 1. Stable right mid and lower lung ventilation since the chest CT yesterday (please see that report). 2. No new cardiopulmonary abnormality. Electronically Signed   By: Genevie Ann M.D.   On: 10/16/2017 16:41   Dg Chest Port 1 View  Result Date: 10/15/2017 CLINICAL DATA:  Pleural effusion EXAM: PORTABLE CHEST 1 VIEW COMPARISON:  10/13/2017 FINDINGS: Left AICD remains in place, unchanged. Right pleural drainage catheter has pulled back into the right chest wall soft tissues. No visible pneumothorax. Moderate right pleural effusion and right lower lobe  airspace opacities. Cardiomegaly. Small left effusion. IMPRESSION: Right pigtail pleural drain has pulled out of the pleural space into the right chest wall. No visible pneumothorax. Continued moderate right effusion and right lower lung airspace disease. Small left effusion. Electronically Signed   By: Rolm Baptise M.D.   On: 10/15/2017 07:13   Dg Chest Port 1 View  Result Date: 10/13/2017 CLINICAL DATA:  Hypoxia EXAM: PORTABLE CHEST 1 VIEW COMPARISON:  Two days ago FINDINGS: Pleural catheter on the right. There is hydropneumothorax at the right base with increasing fluid and decreasing gas component. Hazy appearance of the lower right chest from atelectasis and pleural fluid based on 10/03/2017 CT. Atelectatic type opacity at the left base. Cardiomegaly with dual-chamber pacer/ICD leads. IMPRESSION: Right hydropneumothorax with decreasing gas and increased fluid. Electronically Signed   By: Monte Fantasia M.D.   On:  10/13/2017 06:53    Scheduled Meds: . furosemide  20 mg Oral Daily  . oxybutynin  5 mg Oral BID  . potassium chloride  40 mEq Oral Daily  . sertraline  50 mg Oral QHS   Continuous Infusions: . ampicillin-sulbactam (UNASYN) IV 3 g (10/16/17 1359)     LOS: 16 days    Time spent: 15 minutes  Greater than 50% of the time spent on counseling and coordinating the care.   Leisa Lenz, MD [facility] Pager [phone]  If 7PM-7AM, please contact night-coverage [email or web address] Password [facility] 10/16/2017, 5:43 PM

## 2017-10-17 ENCOUNTER — Inpatient Hospital Stay (HOSPITAL_COMMUNITY): Payer: Medicare Other

## 2017-10-17 LAB — COMPREHENSIVE METABOLIC PANEL
ALBUMIN: 1.6 g/dL — AB (ref 3.5–5.0)
ALT: 38 U/L (ref 17–63)
ANION GAP: 6 (ref 5–15)
AST: 45 U/L — ABNORMAL HIGH (ref 15–41)
Alkaline Phosphatase: 58 U/L (ref 38–126)
BILIRUBIN TOTAL: 0.6 mg/dL (ref 0.3–1.2)
BUN: 11 mg/dL (ref 6–20)
CHLORIDE: 101 mmol/L (ref 101–111)
CO2: 27 mmol/L (ref 22–32)
Calcium: 7.8 mg/dL — ABNORMAL LOW (ref 8.9–10.3)
Creatinine, Ser: 0.94 mg/dL (ref 0.61–1.24)
GFR calc Af Amer: >60 mL/min (ref >60)
GFR calc non Af Amer: >60 mL/min (ref >60)
GLUCOSE: 113 mg/dL — AB (ref 65–99)
POTASSIUM: 3.6 mmol/L (ref 3.5–5.1)
SODIUM: 134 mmol/L — AB (ref 135–145)
TOTAL PROTEIN: 5.4 g/dL — AB (ref 6.5–8.1)

## 2017-10-17 MED ORDER — AMOXICILLIN-POT CLAVULANATE 875-125 MG PO TABS
1.0000 | ORAL_TABLET | Freq: Two times a day (BID) | ORAL | Status: DC
  Administered 2017-10-17: 1 via ORAL
  Filled 2017-10-17: qty 1

## 2017-10-17 MED ORDER — FUROSEMIDE 20 MG PO TABS: 20.0000 mg | ORAL_TABLET | Freq: Every day | ORAL | 0 refills | Status: DC

## 2017-10-17 MED ORDER — AMOXICILLIN-POT CLAVULANATE 875-125 MG PO TABS: 1.0000 | ORAL_TABLET | Freq: Two times a day (BID) | ORAL | 0 refills | Status: DC

## 2017-10-17 MED ORDER — RESOURCE THICKENUP CLEAR PO POWD: 1.0000 | ORAL | 0 refills | Status: DC | PRN

## 2017-10-17 NOTE — Discharge Instructions (Signed)

## 2017-10-17 NOTE — Progress Notes (Addendum)
Inpatient Rehabilitation  Per family request, I have asked our IP Rehab physician, Dr. Letta Pate to review the case and determine if patient is appropriate for your program.  Plan to follow up with team when I know.  Call if questions.   Update: Dr. Letta Pate in agreement; patient not appropriate for IP Rehab at this time requiring Min guard with ambulation.  Recommend home with home health and caregivers or SNF.  Discussed with daughter [name] and nurse case Freight forwarder.  Will sign off at this time.    Carmelia Roller., CCC/SLP Admission Coordinator  [place]  Cell [phone]

## 2017-10-17 NOTE — Progress Notes (Signed)
SATURATION QUALIFICATIONS: (This note is used to comply with regulatory documentation for home oxygen)  Patient Saturations on Room Air at Rest =86%  Patient Saturations on Room Air while Ambulating =   Patient Saturations on  Liters of oxygen while Ambulating =   Please briefly explain why patient needs home oxygen: Unable to ambulate

## 2017-10-17 NOTE — Discharge Summary (Addendum)
Physician Discharge Summary  [name] WCB:[id number] DOB: 1931/02/24 DOA: 09/30/2017  PCP: Noralee Space, MD  Admit date: 09/30/2017 Discharge date: 10/17/2017  Recommendations for Outpatient Follow-up:  1. Check CBC and BMP in outpt setting  Discharge Diagnoses:  Principal Problem:   Acute hypoxemic respiratory failure (HCC) Active Problems:   Essential hypertension   Carotid arterial disease (HCC)   CAP (community acquired pneumonia)   Cardiomyopathy, ischemic   Subdural hematoma (HCC)   Traumatic subdural hematoma with loss of consciousness (HCC)   Pulmonary hypertension (HCC)   TBI (traumatic brain injury) ([place])   Thrombocytopenia (HCC)   Hypertensive heart disease with heart failure (HCC)   Chronic combined systolic and diastolic CHF (congestive heart failure) (HCC)   COPD with acute bronchitis (HCC)   CHF exacerbation (HCC)   Pressure injury of skin   Pleural effusion    Discharge Condition: stable   Diet recommendation: as tolerated   History of present illness:  Hospital Course:  83 y.o.malewith medical history significant ofchronic combined diastolic and systolic congestive heart failure, CAD, hypertension, hyperlipidemia, vascular disease who presentedto the emergency department with complaints of increased cough and congestion started 2 weeks prior to hospital admission. Chest x-ray demonstrated a right lower lobe pneumonia and right pleural effusion. He did not improve withantibiotics and diuresis.CT chest demonstrated a moderate right-sided pleural effusion without loculation. Heunderwent thoracentesis on 12/14. The fluid was exudative and loculated. Pulmonology and palliative care have been consulted. His antibiotics have been changed to those that can cover anaerobes as he is aspirating.Despite an improvement in his leukocytosis, his chest x-ray continues to look worse. He underwent pigtail catheter placement on 12/18 by IR.  Assessment &  Plan:   Acute hypoxemic respiratory failure secondary to pleural effusion and aspiration pneumonia / Loculated right parapneumonic effusion with viridans strep - patient has had lytics and Pulmozyme x2 to assist with draining the effusion - Pleural fluid cultures grew strep viridans sensitive to Unasyn. However he has loculated pneumothorax with no expansion of his right lower lobe. VATS procedure would be risky at this time - Pigtail catheter placed 12/18 - CXR 12/23 showed right hydropneumothorax with decreasing gas and increased fluid - CXR 12/25 showed right pigtail pleural drain has pulled out of the pleural space into the right chest wall. No visible pneumothorax. Continued moderate right effusion and right lower lung airspace disease - Chest tube removed 12/26, per pulmonary okay to keep it out - Unasyn transitioned to Augmentin today, continue for 7 days  Essential hypertension - Stable   Aspiration - Dysphagia 3  Acute on chronic combined sys and dias chf exacerbation - Most recent 2d echo with EF 30-35% - Presenting BNP of 1348, was 496 on 01/22/17 - Stable resp status   COPD - Stable    Hx subdural hematoma - Stable  Hypokalemia - Supplemented  Pressure skin injury, sacral area, stage 1  - Per RN care    DVT prophylaxis: SCD's Code Status: full code  Family Communication: spoke with daughter over the phone this am, gave an update    Consultants:   CCM  IR  Palliative care   Procedures:  Right side chest tube removed 10/16/2017  Antimicrobials:   Unasyn stopped 12/27  Augmentin 12/27 --> 7 days on discharge   Signed:  Leisa Lenz, MD  [facility] 10/17/2017, 1:27 PM  Pager #: [phone]  Time spent in minutes: more than 30 minutes  Discharge Exam: Vitals:   10/17/17 0602 10/17/17 1025  BP: (!) 113/58 136/63  Pulse: 72 68  Resp: 18 18  Temp: 98.9 F (37.2 C) 98.8 F (37.1 C)  SpO2: 96% 94%   Vitals:    10/16/17 1546 10/16/17 2139 10/17/17 0602 10/17/17 1025  BP: (!) 114/59 132/62 (!) 113/58 136/63  Pulse: 66 70 72 68  Resp: 18 18 18 18   Temp: 98.6 F (37 C) 98.6 F (37 C) 98.9 F (37.2 C) 98.8 F (37.1 C)  TempSrc: Oral Oral Oral Oral  SpO2: 99% 98% 96% 94%  Weight:   73.5 kg (162 lb 0.6 oz)   Height:        General: Pt is alert, follows commands appropriately, not in acute distress Cardiovascular: Regular rate and rhythm, S1/S2 + Respiratory: Clear to auscultation bilaterally, no wheezing, no crackles, no rhonchi Abdominal: Soft, non tender, non distended, bowel sounds +, no guarding Extremities: no edema, no cyanosis, pulses palpable bilaterally DP and PT Neuro: Grossly nonfocal  Discharge Instructions    Allergies as of 10/17/2017      Reactions   Lisinopril    BP dropped too low per daughter      Medication List    STOP taking these medications   clotrimazole-betamethasone cream Commonly known as:  LOTRISONE     TAKE these medications   albuterol 108 (90 Base) MCG/ACT inhaler Commonly known as:  PROVENTIL HFA;VENTOLIN HFA Inhale 2 puffs into the lungs every 6 (six) hours as needed for wheezing or shortness of breath.   alendronate 70 MG tablet Commonly known as:  FOSAMAX Take 1 tablet (70 mg total) by mouth once a week. Take with a full glass of water on an empty stomach.   allopurinol 100 MG tablet Commonly known as:  ZYLOPRIM TAKE 1 TABLET ONCE DAILY.   amiodarone 200 MG tablet Commonly known as:  PACERONE TAKE 1 TABLET ONCE DAILY.   amoxicillin-clavulanate 875-125 MG tablet Commonly known as:  AUGMENTIN Take 1 tablet by mouth every 12 (twelve) hours.   carvedilol 3.125 MG tablet Commonly known as:  COREG TAKE 1 TABLET TWICE DAILY WITH A MEAL.   cholecalciferol 1000 units tablet Commonly known as:  VITAMIN D Take 1,000 Units by mouth daily.   clopidogrel 75 MG tablet Commonly known as:  PLAVIX TAKE 1 TABLET ONCE DAILY.   ADVAIR DISKUS  250-50 MCG/DOSE Aepb Generic drug:  Fluticasone-Salmeterol Inhale 1 puff into the lungs 2 (two) times daily.   Fluticasone-Salmeterol 113-14 MCG/ACT Aepb Commonly known as:  AIRDUO RESPICLICK 381/82 Inhale 2 puffs into the lungs 2 (two) times daily.   furosemide 20 MG tablet Commonly known as:  LASIX Take 1 tablet (20 mg total) by mouth daily. Start taking on:  10/18/2017 What changed:    medication strength  See the new instructions.   guaiFENesin 600 MG 12 hr tablet Commonly known as:  MUCINEX Take 600-1,200 mg by mouth 2 (two) times daily. Takes two in the morning and one in the evening.   levothyroxine 25 MCG tablet Commonly known as:  SYNTHROID, LEVOTHROID TAKE 1 TABLET IN THE MORNING ON AN EMPTY STOMACH.   magnesium oxide 400 (241.3 Mg) MG tablet Commonly known as:  MAG-OX Take 0.5 tablets (200 mg total) by mouth daily.   multivitamin capsule Take 1 capsule by mouth daily.   oxybutynin 5 MG tablet Commonly known as:  DITROPAN TAKE 1 TABLET BY MOUTH TWICE DAILY.   polyethylene glycol powder powder Commonly known as:  GLYCOLAX/MIRALAX DISSOLVE ONE CAPFUL IN 8 0Z. WATER TWICE DAILY.  potassium chloride SA 20 MEQ tablet Commonly known as:  K-DUR,KLOR-CON TAKE 1 TABLET DAILY AS NEEDED. (TAKE EACH TIME YOU TAKE FUROSEMIDE) What changed:  See the new instructions.   pravastatin 40 MG tablet Commonly known as:  PRAVACHOL TAKE 1 TABLET ONCE DAILY.   PRESERVISION/LUTEIN Caps Take 1 capsule by mouth 2 (two) times daily.   RESOURCE THICKENUP CLEAR Powd Take 120 g by mouth as needed.   sertraline 50 MG tablet Commonly known as:  ZOLOFT TAKE ONE TABLET AT BEDTIME.   thiamine 100 MG tablet Take 1 tablet (100 mg total) by mouth daily.   traMADol 50 MG tablet Commonly known as:  ULTRAM Take 1 tablet (50 mg total) by mouth every 6 (six) hours as needed for severe pain. What changed:    reasons to take this  additional instructions            Durable  Medical Equipment  (From admission, onward)        Start     Ordered   10/14/17 0748  For home use only DME Walker rolling  Once    Question:  Patient needs a walker to treat with the following condition  Answer:  Weakness   10/14/17 0747       Follow-up Information    Noralee Space, MD. Schedule an appointment as soon as possible for a visit in 2 week(s).   Specialty:  Pulmonary Disease Contact information: [place] [address] [phone]            The results of significant diagnostics from this hospitalization (including imaging, microbiology, ancillary and laboratory) are listed below for reference.    Significant Diagnostic Studies: Dg Chest 2 View  Result Date: 10/17/2017 CLINICAL DATA:  83 year old male with emphysema, shortness breath and weakness. Subsequent encounter. EXAM: CHEST  2 VIEW COMPARISON:  10/16/2017 chest x-ray.  10/15/2017 chest CT. FINDINGS: Persistent loculated hydropneumothorax right mid to lower thoracic region similar to the prior exam. Cardiomegaly with AICD/biventricular pacer in place. Calcified tortuous aorta. Biapical pleural thickening without change. IMPRESSION: Persistent loculated hydropneumothorax right mid to lower thoracic region similar to the prior exam. Cardiomegaly with AICD/biventricular pacer in place. Aortic Atherosclerosis (ICD10-I70.0). Electronically Signed   By: Genia Del M.D.   On: 10/17/2017 10:29   Dg Chest 2 View  Result Date: 10/09/2017 CLINICAL DATA:  Up of right pleural effusion EXAM: CHEST  2 VIEW COMPARISON:  Fluoro spot radiographs of October 08, 2017 and chest x-ray of the same day. FINDINGS: The patient has undergone placement of a pigtail catheter in the right pleural space for drainage of the pleural effusion. There is no pneumothorax. The volume of pleural fluid does not appear appreciably changed. There is a soft tissue mass-like density in the right hilar region the cardiac silhouette is  enlarged. The pulmonary vascularity is normal. The ICD is in stable position. There is dense calcification in the wall of the thoracic aorta. The bony thorax exhibits no acute abnormality. IMPRESSION: Persistent moderate-sized right pleural effusion. A pigtail catheter is present now in the right pleural space. No postplacement complication. Persistent soft tissue prominence in the right perihilar region. Stable cardiomegaly without pulmonary edema. Thoracic aortic atherosclerosis. Electronically Signed   By: David  Martinique M.D.   On: 10/09/2017 09:52   Dg Chest 2 View  Result Date: 10/07/2017 CLINICAL DATA:  Follow-up of aspiration pneumonia. EXAM: CHEST  2 VIEW COMPARISON:  Chest x-ray of October 06, 2017 FINDINGS: The left lung  is adequately inflated. There is a trace of pleural fluid blunting the lateral costophrenic angle on the left. On the right there is a moderate-sized pleural effusion that has not dramatically changed since the previous study. There is no pneumothorax or mediastinal shift. There is soft tissue fullness in the right perihilar region better defined today. The heart is not enlarged. The pulmonary vascularity is normal. There is calcification in the wall of the thoracic aorta. The ICD is in stable position. There is an old fracture of the posterolateral aspect of the left seventh rib. IMPRESSION: Persistent moderate-sized right pleural effusion and trace left pleural effusion. Persistent increased density in the right perihilar and infrahilar regions partially obscured by the effusion. No pulmonary edema. Thoracic aortic atherosclerosis. Electronically Signed   By: David  Martinique M.D.   On: 10/07/2017 09:05   Dg Chest 2 View  Result Date: 09/30/2017 CLINICAL DATA:  Shortness of breath, productive cough, former smoker, hypertension, COPD, coronary artery disease EXAM: CHEST  2 VIEW COMPARISON:  01/22/2017 FINDINGS: LEFT subclavian AICD leads project at RIGHT atrium and RIGHT ventricle,  unchanged. Enlargement of cardiac silhouette. Atherosclerotic calcification aorta. Calcified RIGHT paratracheal node. LEFT basilar atelectasis. Small RIGHT pleural effusion and basilar atelectasis with additional consolidation in RIGHT lower lobe question pneumonia. Upper lungs clear with underlying emphysematous changes noted. No pneumothorax or acute osseous findings. IMPRESSION: RIGHT pleural effusion with bibasilar atelectasis and RIGHT lower lobe infiltrate question pneumonia. Enlargement of cardiac silhouette post AICD with underlying COPD. Electronically Signed   By: Lavonia Dana M.D.   On: 09/30/2017 09:45   Ct Chest Wo Contrast  Result Date: 10/15/2017 CLINICAL DATA:  Follow-up pleural effusion. Extubated pleural effusion on the right. EXAM: CT CHEST WITHOUT CONTRAST TECHNIQUE: Multidetector CT imaging of the chest was performed following the standard protocol without IV contrast. COMPARISON:  10/03/2017 chest CT FINDINGS: Cardiovascular: Chronic cardiomegaly. Dual-chamber ICD/pacer from the left. Extensive atherosclerotic calcification of the aorta and coronaries. Mediastinum/Nodes: Negative for adenopathy or abscess. Lungs/Pleura: Pigtail pleural catheter on the right has been retracted into the subcutaneous fat. Right pleural effusion seen on prior has diminished, but shows progressive pleural thickening with scattered gas bubbles. Most of the remaining fluid is posterior and lateral, with some visual fluid superiorly. Right middle and lower lobe atelectasis with low-density areas likely reflecting necrotic pneumonia in this setting. Mild centrilobular emphysema. No edema. Central airways are now patent. There is a small web in the right bronchus intermedius. Upper Abdomen: No acute finding. Musculoskeletal: No acute or aggressive finding. Remote posterior right rib fractures. Remote T12 compression fracture with cement augmentation. T10 inferior endplate irregularity that is stable from 2017 and  likely degenerative. Similar superior endplate changes seen at T3. There is scoliosis with T1-2 non segmentation. IMPRESSION: 1. Exudative right pleural effusion has significantly decreased in size compared to 10/03/2017, but there is still moderate volume fluid. The pleura shows progressive inflammatory thickening. There is new scattered gas which could be from infection or drainage catheter. 2. Pigtail pleural catheter has been retracted into the subcutaneous fat of the right chest wall. 3. Improved aeration of the right middle and lower lobes. Cystic density foci in the right middle and lower lobes, favor sequela of cavitating pneumonia in this setting. 4. Central airway debris seen previously is significantly cleared. The bronchus intermedius is narrowed by a web-like defect. Electronically Signed   By: Monte Fantasia M.D.   On: 10/15/2017 16:50   Ct Chest W Contrast  Result Date: 10/03/2017 CLINICAL DATA:  Congestive heart failure. Hypertension. Hyperlipidemia. Cough and congestion for 2 weeks. Right pleural effusion. EXAM: CT CHEST WITH CONTRAST TECHNIQUE: Multidetector CT imaging of the chest was performed during intravenous contrast administration. CONTRAST:  43mL ISOVUE-300 IOPAMIDOL (ISOVUE-300) INJECTION 61% COMPARISON:  Plain films, including earlier today. Most recent CT 03/29/2016. FINDINGS: Cardiovascular: Advanced aortic and branch vessel atherosclerosis. Tortuous thoracic aorta. Focal outpouching in the descending aorta at the 5 o'clock position measures 20 x 11 mm on image 144/series 2. Compare similar on the prior. Mild cardiomegaly. Pacer/AICD device with leads at right atrium and right ventricle. Mediastinum/Nodes: No mediastinal or hilar adenopathy. Lungs/Pleura: Moderate right-sided pleural effusion. No significant loculation. Compression versus obstruction of the bronchus intermedius with volume loss in the right lower lobe and right middle lobes. No well-defined obstructive mass. There  is vague hypoattenuation in the region of the central right lower lobe, including on image 86/series 2. Areas of more peripheral right lower lobe hypoattenuation or hypoenhancement, including anteriorly on image 107/series 2. Mild to moderate centrilobular emphysema. 2 mm low posterior left apical pulmonary nodule is similar on image 32/series 5. Adjacent probable mucoid impaction in the left apex at maximally 6 mm, new. Example image 36/series 5. Upper Abdomen: Normal imaged portions of the liver, spleen, stomach, pancreas, gallbladder, adrenal glands, left kidney. Musculoskeletal: Remote right-sided rib fractures. Vertebral augmentation at T12. Mild T10 compression deformity is similar. IMPRESSION: 1. Moderate right-sided pleural effusion, without loculation. 2. Bronchus intermedius compression or obstruction. Resultant right middle lobe and right lower lobe volume loss. Areas of vague hypoattenuation or hypoenhancement within the right lower lobe. Although these areas could represent pneumonia with necrosis, concurrent neoplastic pulmonary nodule or nodules cannot be excluded. Consider further evaluation with bronchoscopy with special attention to the right lower lobe bronchus. Depending on the results of this exam, potential clinical strategies include right-sided thoracentesis, antibiotics, and CT follow-up. If findings persist, outpatient PET may be necessary. 3.  Emphysema (ICD10-J43.9). 4. Probable mucoid impaction at the left apex. Recommend attention on follow-up. 5. Coronary artery atherosclerosis. Aortic Atherosclerosis (ICD10-I70.0). 6. 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 Electronically Signed   By: Abigail Miyamoto M.D.   On: 10/03/2017 15:28   Dg Chest Port 1 View  Result Date: 10/16/2017 CLINICAL DATA:  83 year old male with weakness cough congestion and shortness of breath. Exudative right pleural effusion and evidence of cavitary pneumonia in the right middle and lower lobes  on chest CT yesterday. EXAM: PORTABLE CHEST 1 VIEW COMPARISON:  Chest CT 10/15/2017 and earlier. FINDINGS: Portable AP semi upright view at 1610 hours. Stable lung volumes. Stable cardiac size and mediastinal contours. Stable left chest AICD. The left lung remains clear when allowing for portable technique. Right mid and lower lung mixed patchy opacity and lucency is stable since yesterday. No pneumothorax is evident radiographically. Calcified aorta and right subclavian atherosclerosis. Negative visible bowel gas pattern. IMPRESSION: 1. Stable right mid and lower lung ventilation since the chest CT yesterday (please see that report). 2. No new cardiopulmonary abnormality. Electronically Signed   By: Genevie Ann M.D.   On: 10/16/2017 16:41   Dg Chest Port 1 View  Result Date: 10/15/2017 CLINICAL DATA:  Pleural effusion EXAM: PORTABLE CHEST 1 VIEW COMPARISON:  10/13/2017 FINDINGS: Left AICD remains in place, unchanged. Right pleural drainage catheter has pulled back into the right chest wall soft tissues. No visible pneumothorax. Moderate right pleural effusion and right lower lobe airspace opacities. Cardiomegaly. Small left effusion. IMPRESSION: Right pigtail pleural drain has pulled out  of the pleural space into the right chest wall. No visible pneumothorax. Continued moderate right effusion and right lower lung airspace disease. Small left effusion. Electronically Signed   By: Rolm Baptise M.D.   On: 10/15/2017 07:13   Dg Chest Port 1 View  Result Date: 10/13/2017 CLINICAL DATA:  Hypoxia EXAM: PORTABLE CHEST 1 VIEW COMPARISON:  Two days ago FINDINGS: Pleural catheter on the right. There is hydropneumothorax at the right base with increasing fluid and decreasing gas component. Hazy appearance of the lower right chest from atelectasis and pleural fluid based on 10/03/2017 CT. Atelectatic type opacity at the left base. Cardiomegaly with dual-chamber pacer/ICD leads. IMPRESSION: Right hydropneumothorax with  decreasing gas and increased fluid. Electronically Signed   By: Monte Fantasia M.D.   On: 10/13/2017 06:53   Dg Chest Port 1 View  Result Date: 10/11/2017 CLINICAL DATA:  Follow-up right pneumothorax with pigtail chest tube in place. Follow-up bilateral pleural effusions. EXAM: PORTABLE CHEST 1 VIEW COMPARISON:  10/10/2017, 10/09/2017 and earlier, including CT chest 10/03/2017. FINDINGS: Persistent right lateral pneumothorax, though slightly improved since yesterday. Persistent large right pleural effusion, unchanged, with pigtail catheter in place. Small left pleural effusion, unchanged. Consolidation in the right middle lobe and right lower lobe, unchanged. Mild atelectasis at the left lung base, unchanged. No new pulmonary parenchymal abnormalities. Cardiac silhouette markedly enlarged, unchanged. Left subclavian dual lead pacing defibrillator unchanged. IMPRESSION: 1. Persistent right lateral pneumothorax, slightly improved since yesterday. 2. Stable large right pleural effusion and small left pleural effusion. 3. Stable atelectasis and/or pneumonia involving the right middle lobe and right lower lobe. Stable mild left basilar atelectasis. 4. No new abnormalities. Electronically Signed   By: Evangeline Dakin M.D.   On: 10/11/2017 09:40   Dg Chest Port 1 View  Result Date: 10/10/2017 CLINICAL DATA:  Acute chest pain. EXAM: PORTABLE CHEST 1 VIEW COMPARISON:  10/10/2017 and prior radiographs FINDINGS: Cardiomediastinal silhouette is unchanged. A left AICD/pacemaker again noted. A right pleural catheter/thoracostomy tube is again noted. Pleural air replaces the majority of the pleural fluid identified along the right lower lung which may represent pneumothorax ex vacuo. Continued right mid and lower lung consolidation/atelectasis noted. No other changes are identified. IMPRESSION: Moderate right basilar pneumothorax, with air replacing pleural fluid, and may represent pneumothorax ex vacuo. Unchanged  right thoracostomy tube. Continued right mid and lower lung atelectasis/consolidation. Electronically Signed   By: Margarette Canada M.D.   On: 10/10/2017 18:23   Dg Chest Port 1 View  Result Date: 10/10/2017 CLINICAL DATA:  Pleural effusion on the right EXAM: PORTABLE CHEST 1 VIEW COMPARISON:  Yesterday FINDINGS: Pigtail pleural catheter on the right with smaller right pleural effusion. There is likely pneumothorax loculated at the right base. Trace left effusion. Chronic cardiomegaly. Dual-chamber ICD/pacer from the left. No Kerley lines. IMPRESSION: Decreasing right pleural effusion. Possible small loculated pneumothorax at the right base. Electronically Signed   By: Monte Fantasia M.D.   On: 10/10/2017 07:15   Dg Chest Port 1 View  Result Date: 10/08/2017 CLINICAL DATA:  Pleural effusion. EXAM: PORTABLE CHEST 1 VIEW COMPARISON:  Radiographs of October 07, 2017. FINDINGS: Stable cardiomegaly. Atherosclerosis of thoracic aorta is noted. Left-sided pacemaker is unchanged in position. No pneumothorax is noted. Stable large right pleural effusion is noted with associated atelectasis or infiltrate. Bony thorax is unremarkable. IMPRESSION: Aortic atherosclerosis. Stable large right pleural effusion is noted with associated atelectasis or infiltrate. Electronically Signed   By: Marijo Conception, M.D.   On: 10/08/2017  08:24   Dg Chest Port 1 View  Result Date: 10/06/2017 CLINICAL DATA:  83 year old male status post ultrasound-guided right thoracentesis 10/04/2017. EXAM: PORTABLE CHEST 1 VIEW COMPARISON:  10/04/2017 and earlier. FINDINGS: Portable AP semi upright view at 0432 hours. Moderate volume residual right pleural effusion appears stable since 10/04/2017. No pneumothorax or pulmonary edema. Stable cardiac size and mediastinal contours. Stable left chest cardiac AICD. Calcified aortic atherosclerosis. Minimal patchy opacity at the left lung base is increased. Negative visible bowel gas pattern in the upper  abdomen. Stable visualized osseous structures. IMPRESSION: 1. Stable moderate residual right pleural effusion since 10/04/2017. 2. Increased mild left lung base atelectasis. Electronically Signed   By: Genevie Ann M.D.   On: 10/06/2017 06:56   Dg Chest Port 1 View  Result Date: 10/04/2017 CLINICAL DATA:  Post thoracentesis EXAM: PORTABLE CHEST 1 VIEW COMPARISON:  10/03/2017 FINDINGS: Large right pleural effusion remains. No pneumothorax following thoracentesis. Right lower lobe atelectasis. Cardiomegaly. Left AICD is unchanged. No confluent opacity on the left. IMPRESSION: Large right pleural effusion with right base atelectasis. No pneumothorax following thoracentesis. Electronically Signed   By: Rolm Baptise M.D.   On: 10/04/2017 15:26   Dg Chest Port 1 View  Result Date: 10/03/2017 CLINICAL DATA:  Pleural effusion. EXAM: PORTABLE CHEST 1 VIEW COMPARISON:  09/30/2017.  01/22/2017. FINDINGS: Cardiac pacer noted lead tips over the right atrium right ventricle.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

## 2017-10-17 NOTE — Progress Notes (Signed)
[facility] PROGRESS NOTE  Date of Admission: 09/30/2017 Date of Consult: 10/05/2017 Referring Provider: Dr. Sheran Fava, [facility] Chief Complaint: Short of breath  HPI: 83 yo male presented with dyspnea x 2 weeks.  Treated for pneumonia.  Found to have Rt effusion.    Past Medical History: He  has a past medical history of AAA (abdominal aortic aneurysm) ([place]), Allergic rhinitis, Back pain, CAD (coronary artery disease), Carotid arterial disease ([place]), Chronic combined systolic and diastolic CHF (congestive heart failure) ([place]), Compression fracture, COPD (chronic obstructive pulmonary disease) ([place]), Dermatitis, Diverticulosis of colon, DJD (degenerative joint disease), Hemorrhoids, History of colonic polyps, History of gout, History of pneumonia, Hypercholesterolemia, Hypertension, Hypertensive heart disease, Ischemic cardiomyopathy, Osteoporosis, Peripheral vascular disease ([place]), Presence of cardiac defibrillator, Skin cancer, Syncope, T wave over sensing resulting in inappropriate shocks, Tobacco abuse, and Ventricular tachycardia ([place]) (04/01/2016).  Subjective: Denies chest pain.  Not much cough.  Breathing okay.  Vital signs: BP 136/63 (BP Location: Right Arm)   Pulse 68   Temp 98.8 F (37.1 C) (Oral)   Resp 18   Ht 5\' 11"  (1.803 m)   Wt 162 lb 0.6 oz (73.5 kg)   SpO2 94%   BMI 22.60 kg/m   Intake/Output: I/O last 3 completed shifts: In: 54 [P.O.:1080; I.V.:60; IV Piggyback:600] Out: 5 [Urine:400; Stool:1]  Physical Exam:  General - pleasant Eyes - pupils reactive ENT - no sinus tenderness, no oral exudate, no LAN Cardiac - regular, no murmur Chest - decreased BS Rt base Abd - soft, non tender Ext - no edema Skin - no rashes Neuro - normal strength Psych - normal mood  Discussion: 83 yo male with aspiration pneumonia and Rt empyema with Strep intermedius in pleural fluid culture.  Not a surgical candidate given age and comorbid conditions.  Had good amount of  drainage after chest tube insertion by IR and tPA/pulmozyme therapy.  CXR 12/25 showed chest tube outside pleural space, and removed 12/26.  CT chest shows improvement in pleural space, but has pleural thickening.  Also has some residual fluid, but don't think he needs reinsertion of chest tube at this time.  Assessment/Plan:  Aspiration pneumonia with Rt emphysema. - day 13/21 of unasyn >> can transition to augmentin - f/u CXR intermittently  - don't think he needs reinsertion of pig tail catheter  COPD with emphysema. - prn BDs  Deconditioning. - PT recommending SNF  Acute on chronic combined CHF. - per primary team  DVT prophylaxis: SCDs SUP: Not indicated Diet: D3 diet Goals of care: Full code  Chesley Mires, MD [facility] Pulmonary/Critical Care 10/17/2017, 11:02 AM Pager:  [phone] After 3pm call: [phone]  FLOW SHEET  Cultures: Blood 12/10 >> negative Rt pleural fluid 12/14 >> negative Rt pleural fluid 12/18 >> Strep intermedius  Antibiotics: Zithromax 12/10 >> 12/16 Rocephin 12/10 >> 12/15 Unasyn 12/15 >>   Studies: Echo 12/10 >> mild LVH, EF 30 to 35%, grade 2 DD, mild AS, PAS 43 mmHg CT chest 12/13 >> moderate Rt effusion, emphysema Rt thoracentesis 12/14 >> glucose 85, protein 3.7, LDH 984, WBC 1175 (82%N) CT chest 12/25 >> decreased Rt pleural effusion, pleural thickening  Events: 12/10 admit 12/13 CT chest with effusion  12/14 Thoracentesis 12/15 PCCM consult 12/18 IR pigtail catheter placement  12/19 S/p intrapleural altepase and pulmozyme 12/20 Second dose of intrapleural tPA and pulmozyme   Lines/tubes: Rt chest tube 12/18 >> 12/26  Consults: 12/16 Palliative care, s/o 12/20 12/18 IR  Resolved Problems:  Labs: CMP Latest Ref  Rng & Units 10/17/2017 10/16/2017 10/15/2017  Glucose 65 - 99 mg/dL 113(H) 95 101(H)  BUN 6 - 20 mg/dL 11 9 9   Creatinine 0.61 - 1.24 mg/dL 0.94 0.87 0.89  Sodium 135 - 145 mmol/L 134(L) 135 132(L)  Potassium  3.5 - 5.1 mmol/L 3.6 3.6 3.8  Chloride 101 - 111 mmol/L 101 103 98(L)  CO2 22 - 32 mmol/L 27 26 25   Calcium 8.9 - 10.3 mg/dL 7.8(L) 7.9(L) 7.8(L)  Total Protein 6.5 - 8.1 g/dL 5.4(L) 5.7(L) 5.4(L)  Total Bilirubin 0.3 - 1.2 mg/dL 0.6 0.3 0.9  Alkaline Phos 38 - 126 U/L 58 59 58  AST 15 - 41 U/L 45(H) 39 39  ALT 17 - 63 U/L 38 36 34    CBC Latest Ref Rng & Units 10/16/2017 10/15/2017 10/14/2017  WBC 4.0 - 10.5 K/uL 8.7 9.8 13.0(H)  Hemoglobin 13.0 - 17.0 g/dL 11.4(L) 10.7(L) 11.3(L)  Hematocrit 39.0 - 52.0 % 34.0(L) 31.6(L) 33.6(L)  Platelets 150 - 400 K/uL 228 235 247    CBG (last 3)  No results for input(s): GLUCAP in the last 72 hours.  Imaging: Dg Chest 2 View  Result Date: 10/17/2017 CLINICAL DATA:  83 year old male with emphysema, shortness breath and weakness. Subsequent encounter. EXAM: CHEST  2 VIEW COMPARISON:  10/16/2017 chest x-ray.  10/15/2017 chest CT. FINDINGS: Persistent loculated hydropneumothorax right mid to lower thoracic region similar to the prior exam. Cardiomegaly with AICD/biventricular pacer in place. Calcified tortuous aorta. Biapical pleural thickening without change. IMPRESSION: Persistent loculated hydropneumothorax right mid to lower thoracic region similar to the prior exam. Cardiomegaly with AICD/biventricular pacer in place. Aortic Atherosclerosis (ICD10-I70.0). Electronically Signed   By: Genia Del M.D.   On: 10/17/2017 10:29   Ct Chest Wo Contrast  Result Date: 10/15/2017 CLINICAL DATA:  Follow-up pleural effusion. Extubated pleural effusion on the right. EXAM: CT CHEST WITHOUT CONTRAST TECHNIQUE: Multidetector CT imaging of the chest was performed following the standard protocol without IV contrast. COMPARISON:  10/03/2017 chest CT FINDINGS: Cardiovascular: Chronic cardiomegaly. Dual-chamber ICD/pacer from the left. Extensive atherosclerotic calcification of the aorta and coronaries. Mediastinum/Nodes: Negative for adenopathy or abscess.  Lungs/Pleura: Pigtail pleural catheter on the right has been retracted into the subcutaneous fat. Right pleural effusion seen on prior has diminished, but shows progressive pleural thickening with scattered gas bubbles. Most of the remaining fluid is posterior and lateral, with some visual fluid superiorly. Right middle and lower lobe atelectasis with low-density areas likely reflecting necrotic pneumonia in this setting. Mild centrilobular emphysema. No edema. Central airways are now patent. There is a small web in the right bronchus intermedius. Upper Abdomen: No acute finding. Musculoskeletal: No acute or aggressive finding. Remote posterior right rib fractures. Remote T12 compression fracture with cement augmentation. T10 inferior endplate irregularity that is stable from 2017 and likely degenerative. Similar superior endplate changes seen at T3. There is scoliosis with T1-2 non segmentation. IMPRESSION: 1. Exudative right pleural effusion has significantly decreased in size compared to 10/03/2017, but there is still moderate volume fluid. The pleura shows progressive inflammatory thickening. There is new scattered gas which could be from infection or drainage catheter. 2. Pigtail pleural catheter has been retracted into the subcutaneous fat of the right chest wall. 3. Improved aeration of the right middle and lower lobes. Cystic density foci in the right middle and lower lobes, favor sequela of cavitating pneumonia in this setting. 4. Central airway debris seen previously is significantly cleared. The bronchus intermedius is narrowed by a web-like defect.  Electronically Signed   By: Monte Fantasia M.D.   On: 10/15/2017 16:50   Dg Chest Port 1 View  Result Date: 10/16/2017 CLINICAL DATA:  83 year old male with weakness cough congestion and shortness of breath. Exudative right pleural effusion and evidence of cavitary pneumonia in the right middle and lower lobes on chest CT yesterday. EXAM: PORTABLE CHEST  1 VIEW COMPARISON:  Chest CT 10/15/2017 and earlier. FINDINGS: Portable AP semi upright view at 1610 hours. Stable lung volumes. Stable cardiac size and mediastinal contours. Stable left chest AICD. The left lung remains clear when allowing for portable technique. Right mid and lower lung mixed patchy opacity and lucency is stable since yesterday. No pneumothorax is evident radiographically. Calcified aorta and right subclavian atherosclerosis. Negative visible bowel gas pattern. IMPRESSION: 1. Stable right mid and lower lung ventilation since the chest CT yesterday (please see that report). 2. No new cardiopulmonary abnormality. Electronically Signed   By: Genevie Ann M.D.   On: 10/16/2017 16:41

## 2017-10-17 NOTE — Progress Notes (Signed)
[facility]  Patient Status: New  [facility] is following in anticipation of providing the following services: RN  If patient discharges after hours, please call [phone].   [name] 10/17/2017, 9:29 AM

## 2017-10-17 NOTE — Progress Notes (Signed)
Spoke with CIR rep, patient does not qualify for CIR. Spoke with patient and daughters, they do not want SNF, prefer to take patient home. Offered choice, they chose [facility]. Patient also requires O2 at home, contacted [facility] to arrange. Patient states he has all needed DME at home already except O2. Family will transport patient home. Awaiting final orders. [phone]
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
## 2017-10-17 NOTE — Progress Notes (Signed)
Patient ID: [name], male   DOB: [birth year], 83 y.o.   MRN: [id number]  PROGRESS NOTE    [name]  BOF:[id number] DOB: 09/15/1931 DOA: 09/30/2017  PCP: Noralee Space, MD   Brief Narrative:  83 y.o.malewith medical history significant ofchronic combined diastolic and systolic congestive heart failure, CAD, hypertension, hyperlipidemia, vascular disease who presentedto the emergency department with complaints of increased cough and congestion started 2 weeks prior to hospital admission. Chest x-ray demonstrated a right lower lobe pneumonia and right pleural effusion. He did not improve withantibiotics and diuresis.CT chest demonstrated a moderate right-sided pleural effusion without loculation. Heunderwent thoracentesis on 12/14. The fluid was exudative and loculated. Pulmonology and palliative care have been consulted. His antibiotics have been changed to those that can cover anaerobes as he is aspirating.Despite an improvement in his leukocytosis, his chest x-ray continues to look worse. He underwent pigtail catheter placement on 12/18 by IR.  Assessment & Plan:   Acute hypoxemic respiratory failure secondary to pleural effusion and aspiration pneumonia / Loculated right parapneumonic effusion with viridans strep - patient has had lytics and Pulmozyme x2 to assist with draining the effusion - Pleural fluid cultures grew strep viridans sensitive to Unasyn.  However he has loculated pneumothorax with no expansion of his right lower lobe.  VATS procedure would be risky at this time - Pigtail catheter placed 12/18 - CXR 12/23 showed right hydropneumothorax with decreasing gas and increased fluid - CXR 12/25 showed right pigtail pleural drain has pulled out of the pleural space into the right chest wall. No visible pneumothorax. Continued moderate right effusion and right lower lung airspace disease - Chest tube removed 12/26, per pulmonary okay to keep it out - Unasyn  transitioned to Augmentin today, continue for 7 days - Awaiting to see if pt can go to inpatient rehab   Essential hypertension - Stable   Aspiration - Dysphagia 3  Acute on chronic combined sys and dias chf exacerbation - Most recent 2d echo with EF 30-35% - Presenting BNP of 1348, was 496 on 01/22/17 - Stable resp status   COPD - Stable    Hx subdural hematoma - Stable  Hypokalemia - Supplemented    DVT prophylaxis: SCD's Code Status: full code  Family Communication: spoke with daughter over the phone this am, gave an update Disposition Plan: awaiting placement to rehab   Consultants:   CCM  IR  Palliative care   Procedures:  Right side chest tube removed 10/16/2017  Antimicrobials:   Unasyn stopped 12/27  Augmentin 12/27 -->   Subjective: Feels better.  Objective: Vitals:   10/16/17 1546 10/16/17 2139 10/17/17 0602 10/17/17 1025  BP: (!) 114/59 132/62 (!) 113/58 136/63  Pulse: 66 70 72 68  Resp: 18 18 18 18   Temp: 98.6 F (37 C) 98.6 F (37 C) 98.9 F (37.2 C) 98.8 F (37.1 C)  TempSrc: Oral Oral Oral Oral  SpO2: 99% 98% 96% 94%  Weight:   73.5 kg (162 lb 0.6 oz)   Height:        Intake/Output Summary (Last 24 hours) at 10/17/2017 1153 Last data filed at 10/17/2017 0806 Gross per 24 hour  Intake 1520 ml  Output 451 ml  Net 1069 ml   Filed Weights   10/15/17 0455 10/16/17 0445 10/17/17 0602  Weight: 78.5 kg (173 lb) 76.5 kg (168 lb 10.4 oz) 73.5 kg (162 lb 0.6 oz)    Physical Exam  Constitutional: Appears well-developed and well-nourished. No distress.  CVS: RRR, S1/S2 + Pulmonary: no wheezing, no rhonchi  Abdominal: Soft. BS +,  no distension, tenderness, rebound or guarding.  Musculoskeletal: Normal range of motion. No edema and no tenderness.  Lymphadenopathy: No lymphadenopathy noted, cervical, inguinal. Neuro: Alert. Normal reflexes, muscle tone coordination. No cranial nerve deficit. Skin: Skin is warm and dry.     Psychiatric: Normal mood and affect. Behavior, judgment, thought content normal.    Data Reviewed: I have personally reviewed following labs and imaging studies  CBC: Recent Labs  Lab 10/12/17 0535 10/13/17 0550 10/14/17 0440 10/15/17 0418 10/16/17 0433  WBC 12.9* 13.7* 13.0* 9.8 8.7  HGB 11.6* 11.3* 11.3* 10.7* 11.4*  HCT 34.0* 33.1* 33.6* 31.6* 34.0*  MCV 95.8 96.2 96.8 96.3 97.4  PLT 244 246 247 235 270   Basic Metabolic Panel: Recent Labs  Lab 10/13/17 0550 10/14/17 0440 10/15/17 0418 10/16/17 0433 10/17/17 0419  NA 135 133* 132* 135 134*  K 3.6 3.9 3.8 3.6 3.6  CL 99* 99* 98* 103 101  CO2 29 26 25 26 27   GLUCOSE 96 92 101* 95 113*  BUN 13 10 9 9 11   CREATININE 0.87 0.85 0.89 0.87 0.94  CALCIUM 7.7* 7.9* 7.8* 7.9* 7.8*   GFR: Estimated Creatinine Clearance: 60.8 mL/min (by C-G formula based on SCr of 0.94 mg/dL). Liver Function Tests: Recent Labs  Lab 10/13/17 0550 10/14/17 0440 10/15/17 0418 10/16/17 0433 10/17/17 0419  AST 37 38 39 39 45*  ALT 38 38 34 36 38  ALKPHOS 53 53 58 59 58  BILITOT 0.4 0.8 0.9 0.3 0.6  PROT 5.5* 5.4* 5.4* 5.7* 5.4*  ALBUMIN 1.7* 1.7* 1.7* 1.8* 1.6*   No results for input(s): LIPASE, AMYLASE in the last 168 hours. No results for input(s): AMMONIA in the last 168 hours. Coagulation Profile: No results for input(s): INR, PROTIME in the last 168 hours. Cardiac Enzymes: No results for input(s): CKTOTAL, CKMB, CKMBINDEX, TROPONINI in the last 168 hours. BNP (last 3 results) Recent Labs    01/22/17 1231  PROBNP 496.0*   HbA1C: No results for input(s): HGBA1C in the last 72 hours. CBG: No results for input(s): GLUCAP in the last 168 hours. Lipid Profile: No results for input(s): CHOL, HDL, LDLCALC, TRIG, CHOLHDL, LDLDIRECT in the last 72 hours. Thyroid Function Tests: No results for input(s): TSH, T4TOTAL, FREET4, T3FREE, THYROIDAB in the last 72 hours. Anemia Panel: No results for input(s): VITAMINB12, FOLATE,  FERRITIN, TIBC, IRON, RETICCTPCT in the last 72 hours. Urine analysis:    Component Value Date/Time   COLORURINE YELLOW 04/14/2016 1028   APPEARANCEUR CLEAR 04/14/2016 1028   LABSPEC 1.013 04/14/2016 1028   PHURINE 6.0 04/14/2016 1028   GLUCOSEU NEGATIVE 04/14/2016 1028   HGBUR NEGATIVE 04/14/2016 1028   BILIRUBINUR NEGATIVE 04/14/2016 1028   KETONESUR NEGATIVE 04/14/2016 1028   PROTEINUR NEGATIVE 04/14/2016 1028   UROBILINOGEN 2.0 (H) 10/28/2012 0820   NITRITE NEGATIVE 04/14/2016 1028   LEUKOCYTESUR NEGATIVE 04/14/2016 1028   Sepsis Labs: @LABRCNTIP (procalcitonin:4,lacticidven:4)   ) Recent Results (from the past 240 hour(s))  Body fluid culture     Status: None   Collection Time: 10/08/17  1:03 PM  Result Value Ref Range Status   Specimen Description PLEURAL RIGHT  Final   Special Requests NONE  Final   Gram Stain   Final    RARE WBC PRESENT, PREDOMINANTLY PMN FEW GRAM POSITIVE COCCI IN PAIRS IN CHAINS RARE GRAM VARIABLE ROD Performed at [facility], [name],  [place] [address]    Culture FEW STREPTOCOCCUS INTERMEDIUS  Final   Report Status 10/11/2017 FINAL  Final   Organism ID, Bacteria STREPTOCOCCUS INTERMEDIUS  Final      Susceptibility   Streptococcus intermedius - MIC*    PENICILLIN 0.12 SENSITIVE Sensitive     CEFTRIAXONE 0.25 SENSITIVE Sensitive     ERYTHROMYCIN <=0.12 SENSITIVE Sensitive     LEVOFLOXACIN 0.5 SENSITIVE Sensitive     VANCOMYCIN 0.5 SENSITIVE Sensitive     AMPICILLIN Value in next row Sensitive      SENSITIVE<=0.25    * FEW STREPTOCOCCUS INTERMEDIUS      Radiology Studies: Dg Chest 2 View  Result Date: 10/17/2017 CLINICAL DATA:  83 year old male with emphysema, shortness breath and weakness. Subsequent encounter. EXAM: CHEST  2 VIEW COMPARISON:  10/16/2017 chest x-ray.  10/15/2017 chest CT. FINDINGS: Persistent loculated hydropneumothorax right mid to lower thoracic region similar to the prior exam. Cardiomegaly with  AICD/biventricular pacer in place. Calcified tortuous aorta. Biapical pleural thickening without change. IMPRESSION: Persistent loculated hydropneumothorax right mid to lower thoracic region similar to the prior exam. Cardiomegaly with AICD/biventricular pacer in place. Aortic Atherosclerosis (ICD10-I70.0). Electronically Signed   By: Genia Del M.D.   On: 10/17/2017 10:29   Ct Chest Wo Contrast  Result Date: 10/15/2017 CLINICAL DATA:  Follow-up pleural effusion. Extubated pleural effusion on the right. EXAM: CT CHEST WITHOUT CONTRAST TECHNIQUE: Multidetector CT imaging of the chest was performed following the standard protocol without IV contrast. COMPARISON:  10/03/2017 chest CT FINDINGS: Cardiovascular: Chronic cardiomegaly. Dual-chamber ICD/pacer from the left. Extensive atherosclerotic calcification of the aorta and coronaries. Mediastinum/Nodes: Negative for adenopathy or abscess. Lungs/Pleura: Pigtail pleural catheter on the right has been retracted into the subcutaneous fat. Right pleural effusion seen on prior has diminished, but shows progressive pleural thickening with scattered gas bubbles. Most of the remaining fluid is posterior and lateral, with some visual fluid superiorly. Right middle and lower lobe atelectasis with low-density areas likely reflecting necrotic pneumonia in this setting. Mild centrilobular emphysema. No edema. Central airways are now patent. There is a small web in the right bronchus intermedius. Upper Abdomen: No acute finding. Musculoskeletal: No acute or aggressive finding. Remote posterior right rib fractures. Remote T12 compression fracture with cement augmentation. T10 inferior endplate irregularity that is stable from 2017 and likely degenerative. Similar superior endplate changes seen at T3. There is scoliosis with T1-2 non segmentation. IMPRESSION: 1. Exudative right pleural effusion has significantly decreased in size compared to 10/03/2017, but there is still  moderate volume fluid. The pleura shows progressive inflammatory thickening. There is new scattered gas which could be from infection or drainage catheter. 2. Pigtail pleural catheter has been retracted into the subcutaneous fat of the right chest wall. 3. Improved aeration of the right middle and lower lobes. Cystic density foci in the right middle and lower lobes, favor sequela of cavitating pneumonia in this setting. 4. Central airway debris seen previously is significantly cleared. The bronchus intermedius is narrowed by a web-like defect. Electronically Signed   By: Monte Fantasia M.D.   On: 10/15/2017 16:50   Dg Chest Port 1 View  Result Date: 10/16/2017 CLINICAL DATA:  83 year old male with weakness cough congestion and shortness of breath. Exudative right pleural effusion and evidence of cavitary pneumonia in the right middle and lower lobes on chest CT yesterday. EXAM: PORTABLE CHEST 1 VIEW COMPARISON:  Chest CT 10/15/2017 and earlier. FINDINGS: Portable AP semi upright view at 1610 hours. Stable lung volumes. Stable cardiac  size and mediastinal contours. Stable left chest AICD. The left lung remains clear when allowing for portable technique. Right mid and lower lung mixed patchy opacity and lucency is stable since yesterday. No pneumothorax is evident radiographically. Calcified aorta and right subclavian atherosclerosis. Negative visible bowel gas pattern. IMPRESSION: 1. Stable right mid and lower lung ventilation since the chest CT yesterday (please see that report). 2. No new cardiopulmonary abnormality. Electronically Signed   By: Genevie Ann M.D.   On: 10/16/2017 16:41   Dg Chest Port 1 View  Result Date: 10/15/2017 CLINICAL DATA:  Pleural effusion EXAM: PORTABLE CHEST 1 VIEW COMPARISON:  10/13/2017 FINDINGS: Left AICD remains in place, unchanged. Right pleural drainage catheter has pulled back into the right chest wall soft tissues. No visible pneumothorax. Moderate right pleural effusion and  right lower lobe airspace opacities. Cardiomegaly. Small left effusion. IMPRESSION: Right pigtail pleural drain has pulled out of the pleural space into the right chest wall. No visible pneumothorax. Continued moderate right effusion and right lower lung airspace disease. Small left effusion. Electronically Signed   By: Rolm Baptise M.D.   On: 10/15/2017 07:13    Scheduled Meds: . furosemide  20 mg Oral Daily  . oxybutynin  5 mg Oral BID  . potassium chloride  40 mEq Oral Daily  . sertraline  50 mg Oral QHS   Continuous Infusions:    LOS: 17 days    Time spent: 25 minutes  Greater than 50% of the time spent on counseling and coordinating the care.   Leisa Lenz, MD [facility] Pager [phone]  If 7PM-7AM, please contact night-coverage [email or web address] Password [facility] 10/17/2017, 11:53 AM

## 2017-10-23 DIAGNOSIS — I255 Ischemic cardiomyopathy: Secondary | ICD-10-CM | POA: Diagnosis not present

## 2017-10-23 DIAGNOSIS — M109 Gout, unspecified: Secondary | ICD-10-CM | POA: Diagnosis not present

## 2017-10-23 DIAGNOSIS — K5790 Diverticulosis of intestine, part unspecified, without perforation or abscess without bleeding: Secondary | ICD-10-CM | POA: Diagnosis not present

## 2017-10-23 DIAGNOSIS — Z7951 Long term (current) use of inhaled steroids: Secondary | ICD-10-CM | POA: Diagnosis not present

## 2017-10-23 DIAGNOSIS — I739 Peripheral vascular disease, unspecified: Secondary | ICD-10-CM | POA: Diagnosis not present

## 2017-10-23 DIAGNOSIS — I110 Hypertensive heart disease with heart failure: Secondary | ICD-10-CM | POA: Diagnosis not present

## 2017-10-23 DIAGNOSIS — M810 Age-related osteoporosis without current pathological fracture: Secondary | ICD-10-CM | POA: Diagnosis not present

## 2017-10-23 DIAGNOSIS — L89120 Pressure ulcer of left upper back, unstageable: Secondary | ICD-10-CM | POA: Diagnosis not present

## 2017-10-23 DIAGNOSIS — J209 Acute bronchitis, unspecified: Secondary | ICD-10-CM | POA: Diagnosis not present

## 2017-10-23 DIAGNOSIS — L89130 Pressure ulcer of right lower back, unstageable: Secondary | ICD-10-CM | POA: Diagnosis not present

## 2017-10-23 DIAGNOSIS — I5043 Acute on chronic combined systolic (congestive) and diastolic (congestive) heart failure: Secondary | ICD-10-CM | POA: Diagnosis not present

## 2017-10-23 DIAGNOSIS — M1990 Unspecified osteoarthritis, unspecified site: Secondary | ICD-10-CM | POA: Diagnosis not present

## 2017-10-23 DIAGNOSIS — E785 Hyperlipidemia, unspecified: Secondary | ICD-10-CM | POA: Diagnosis not present

## 2017-10-23 DIAGNOSIS — D696 Thrombocytopenia, unspecified: Secondary | ICD-10-CM | POA: Diagnosis not present

## 2017-10-23 DIAGNOSIS — J440 Chronic obstructive pulmonary disease with acute lower respiratory infection: Secondary | ICD-10-CM | POA: Diagnosis not present

## 2017-10-23 DIAGNOSIS — I2510 Atherosclerotic heart disease of native coronary artery without angina pectoris: Secondary | ICD-10-CM | POA: Diagnosis not present

## 2017-10-23 DIAGNOSIS — R1310 Dysphagia, unspecified: Secondary | ICD-10-CM | POA: Diagnosis not present

## 2017-10-23 DIAGNOSIS — J690 Pneumonitis due to inhalation of food and vomit: Secondary | ICD-10-CM | POA: Diagnosis not present

## 2017-10-23 DIAGNOSIS — Z95810 Presence of automatic (implantable) cardiac defibrillator: Secondary | ICD-10-CM | POA: Diagnosis not present

## 2017-10-24 ENCOUNTER — Other Ambulatory Visit: Payer: Self-pay | Admitting: Pulmonary Disease

## 2017-10-24 ENCOUNTER — Encounter: Payer: Self-pay | Admitting: Pulmonary Disease

## 2017-10-24 ENCOUNTER — Telehealth: Payer: Self-pay | Admitting: Pulmonary Disease

## 2017-10-24 DIAGNOSIS — I255 Ischemic cardiomyopathy: Secondary | ICD-10-CM | POA: Diagnosis not present

## 2017-10-24 DIAGNOSIS — L89120 Pressure ulcer of left upper back, unstageable: Secondary | ICD-10-CM | POA: Diagnosis not present

## 2017-10-24 DIAGNOSIS — J690 Pneumonitis due to inhalation of food and vomit: Secondary | ICD-10-CM | POA: Diagnosis not present

## 2017-10-24 DIAGNOSIS — J209 Acute bronchitis, unspecified: Secondary | ICD-10-CM | POA: Diagnosis not present

## 2017-10-24 DIAGNOSIS — J440 Chronic obstructive pulmonary disease with acute lower respiratory infection: Secondary | ICD-10-CM | POA: Diagnosis not present

## 2017-10-24 DIAGNOSIS — L89130 Pressure ulcer of right lower back, unstageable: Secondary | ICD-10-CM | POA: Diagnosis not present

## 2017-10-24 NOTE — Telephone Encounter (Signed)
Spoke with Tees Toh, she states she saw pt yesterday after his hospitalization. He has 3 wounds at the lower base of his spine and they would like to clean them up with Medi honey aldenate,  twice a week to help with debriding the wound and she thinks it will work really well for him. SN can we ok verbal to have this done for pt. Please advise.

## 2017-10-24 NOTE — Telephone Encounter (Signed)
Per SN:  Ok to give verbal order. I called [name] and advised her. Nothing further is needed.

## 2017-10-24 NOTE — Telephone Encounter (Signed)
Copied from [place] [id number]. Topic: General - Other >> Oct 24, 2017  3:37 PM Patrice Paradise wrote: Reason for CRM: Reason for CRM: Danton Clap OST @ [phone] called to request verbal order to see the patient 1 time a week for 5 weeks for swallowing. She is also request a Rx for the patient for acid reflux, she believe he is having it at when he lie down.

## 2017-10-25 ENCOUNTER — Ambulatory Visit (INDEPENDENT_AMBULATORY_CARE_PROVIDER_SITE_OTHER): Payer: Medicare Other

## 2017-10-25 ENCOUNTER — Telehealth: Payer: Self-pay | Admitting: Pulmonary Disease

## 2017-10-25 DIAGNOSIS — Z95810 Presence of automatic (implantable) cardiac defibrillator: Secondary | ICD-10-CM | POA: Diagnosis not present

## 2017-10-25 DIAGNOSIS — I255 Ischemic cardiomyopathy: Secondary | ICD-10-CM | POA: Diagnosis not present

## 2017-10-25 DIAGNOSIS — J209 Acute bronchitis, unspecified: Secondary | ICD-10-CM | POA: Diagnosis not present

## 2017-10-25 DIAGNOSIS — J690 Pneumonitis due to inhalation of food and vomit: Secondary | ICD-10-CM | POA: Diagnosis not present

## 2017-10-25 DIAGNOSIS — J440 Chronic obstructive pulmonary disease with acute lower respiratory infection: Secondary | ICD-10-CM | POA: Diagnosis not present

## 2017-10-25 DIAGNOSIS — L89120 Pressure ulcer of left upper back, unstageable: Secondary | ICD-10-CM | POA: Diagnosis not present

## 2017-10-25 DIAGNOSIS — L89130 Pressure ulcer of right lower back, unstageable: Secondary | ICD-10-CM | POA: Diagnosis not present

## 2017-10-25 DIAGNOSIS — I5042 Chronic combined systolic (congestive) and diastolic (congestive) heart failure: Secondary | ICD-10-CM

## 2017-10-25 NOTE — Telephone Encounter (Signed)
lmtcb for [place] at [facility].

## 2017-10-25 NOTE — Progress Notes (Signed)
EPIC Encounter for ICM Monitoring  Patient Name: [name] is a 84 y.o. male Date: 10/25/2017 Primary Care Physican: Noralee Space, MD Primary Cardiologist: [place] Electrophysiologist: Faustino Congress Weight:unknown  Call todaughter [name], DPR. Heart Failure questions reviewed, pt's chest sounds congested due to pneumonia. She said patient had a chest tube during hospitalization.  Currently has home health nurse and PT.  Hospitalized 09/30/17 to 10/17/17 for pneumonia.   Thoracic impedance normal today but was abnormal suggesting fluid accumulation from 10/17/17 to 10/24/17.  Prescribed dosage: Furosemide 20 mg 1 tablet daily.  Potassium 20 mEq 1 tablet every day as needed (Take each time you take a Furosemide).  Labs: 10/17/2017 Creatinine 0.94, BUN 11, Potassium 3.6, Sodium 134, EGFR >60 10/16/2017 Creatinine 0.87, BUN 9, Potassium 3.6, Sodium 135, EGFR >60 10/15/2017 Creatinine 0.89, BUN 9, Potassium 3.8, Sodium 132, EGFR >60 10/14/2017 Creatinine 0.85, BUN 10, Potassium 3.9, Sodium 133, EGFR >60  10/13/2017 Creatinine 0.87, BUN 13, Potassium 3.6, Sodium 135, EGFR >60 10/12/2017 Creatinine 0.91, BUN 15, Potassium 2.8, Sodium 133, EGFR >60  10/11/2017 Creatinine 0.95, BUN 15, Potassium 3.2, Sodium 134, EGFR >60  10/10/2017 Creatinine 1.03, BUN 15, Potassium 3.6, Sodium 135, EGFR >60  10/09/2017 Creatinine 0.92, BUN 16, Potassium 3.7, Sodium 134, EGFR >60  10/08/2017 Creatinine 0.78, BUN 18, Potassium 3.6, Sodium 136, EGFR >60  10/07/2017 Creatinine 0.79, BUN 24, Potassium 3.9, Sodium 135, EGFR >60  10/06/2017 Creatinine 0.96, BUN 31, Potassium 3.5, Sodium 135, EGFR >60   10/05/2017 Creatinine 0.96, BUN 40, Potassium 3.7, Sodium 134, EGFR >60  10/04/2017 Creatinine 1.02, BUN 43, Potassium 2.9, Sodium 132, EGFR >60   10/03/2017 Creatinine 0.95, BUN 40, Potassium 3.7, Sodium 131, EGFR >60   10/02/2017 Creatinine  0.96, BUN 35, Potassium 3.0, Sodium 132, EGFR >60  10/01/2017 Creatinine 1.00, BUN 32, Potassium 3.2, Sodium 131, EGFR >60   09/30/2017 Creatinine 0.98, BUN 35, Potassium 4.4, Sodium 133, EGFR >60  09/26/2017 Creatinine 1.12, BUN 4.3, Potassium 4.3, Sodium 137, EGFR >60  04/01/2017 Creatinine 0.94, BUN 19, Potassium 5.0, Sodium 134, [id number] 01/22/2017 Creatinine 0.94, BUN 19, Potassium 5.0, Sodium 134 01/14/2017 Creatinine 1.13, BUN 39, Potassium 3.7, Sodium 135, EGFR 58->60  01/13/2017 Creatinine 1.30, BUN 28, Potassium 5.1, Sodium 135, EGFR 49-57  11/13/2016 Creatinine 1.13, BUN 17, Potassium 4.6, Sodium 139 09/19/2017Creatinine 1.06, BUN 18, Potassium 4.6, Sodium 133  09/14/2017Creatinine 0.98, BUN 15, Potassium 4.9, Sodium 138  08/16/2017Creatinine 0.84, BUN 11, Potassium 4.8, Sodium 137  05/16/2016 Creatinine 1.07, BUN 16, Potassium 5.1, Sodium 127       A complete set of results can be found in Results Review.  Recommendations: No changes.    Encouraged to call for fluid symptoms.  Follow-up plan: ICM clinic phone appointment on 11/12/2017 to recheck fluid levels.   Copy of ICM check sent to Dr. Caryl Comes and Dr Percival Spanish.   3 month ICM trend: 10/25/2017    1 Year ICM trend:       [name], RN 10/25/2017 10:56 AM

## 2017-10-26 DIAGNOSIS — I255 Ischemic cardiomyopathy: Secondary | ICD-10-CM | POA: Diagnosis not present

## 2017-10-26 DIAGNOSIS — L89130 Pressure ulcer of right lower back, unstageable: Secondary | ICD-10-CM | POA: Diagnosis not present

## 2017-10-26 DIAGNOSIS — J209 Acute bronchitis, unspecified: Secondary | ICD-10-CM | POA: Diagnosis not present

## 2017-10-26 DIAGNOSIS — L89120 Pressure ulcer of left upper back, unstageable: Secondary | ICD-10-CM | POA: Diagnosis not present

## 2017-10-26 DIAGNOSIS — J440 Chronic obstructive pulmonary disease with acute lower respiratory infection: Secondary | ICD-10-CM | POA: Diagnosis not present

## 2017-10-26 DIAGNOSIS — J690 Pneumonitis due to inhalation of food and vomit: Secondary | ICD-10-CM | POA: Diagnosis not present

## 2017-10-28 ENCOUNTER — Other Ambulatory Visit: Payer: Self-pay | Admitting: Pulmonary Disease

## 2017-10-28 DIAGNOSIS — I255 Ischemic cardiomyopathy: Secondary | ICD-10-CM | POA: Diagnosis not present

## 2017-10-28 DIAGNOSIS — J209 Acute bronchitis, unspecified: Secondary | ICD-10-CM | POA: Diagnosis not present

## 2017-10-28 DIAGNOSIS — J690 Pneumonitis due to inhalation of food and vomit: Secondary | ICD-10-CM | POA: Diagnosis not present

## 2017-10-28 DIAGNOSIS — L89120 Pressure ulcer of left upper back, unstageable: Secondary | ICD-10-CM | POA: Diagnosis not present

## 2017-10-28 DIAGNOSIS — J440 Chronic obstructive pulmonary disease with acute lower respiratory infection: Secondary | ICD-10-CM | POA: Diagnosis not present

## 2017-10-28 DIAGNOSIS — J449 Chronic obstructive pulmonary disease, unspecified: Secondary | ICD-10-CM

## 2017-10-28 DIAGNOSIS — L89130 Pressure ulcer of right lower back, unstageable: Secondary | ICD-10-CM | POA: Diagnosis not present

## 2017-10-28 NOTE — Telephone Encounter (Signed)
Jim with [facility] requesting an order for PT- 2Xweekly for 4 weeks.  SN Please advise if ok.  Thanks!

## 2017-10-28 NOTE — Telephone Encounter (Signed)
Clair Gulling from [facility] calling for verbal orders for Physical Therapy 2x/wk for 4wks. Cb [phone].

## 2017-10-28 NOTE — Telephone Encounter (Signed)
Ordered. Nothing further needed.

## 2017-10-29 ENCOUNTER — Encounter (INDEPENDENT_AMBULATORY_CARE_PROVIDER_SITE_OTHER): Payer: Self-pay | Admitting: Specialist

## 2017-10-29 ENCOUNTER — Telehealth: Payer: Self-pay | Admitting: Pulmonary Disease

## 2017-10-29 ENCOUNTER — Telehealth (INDEPENDENT_AMBULATORY_CARE_PROVIDER_SITE_OTHER): Payer: Self-pay

## 2017-10-29 DIAGNOSIS — I255 Ischemic cardiomyopathy: Secondary | ICD-10-CM | POA: Diagnosis not present

## 2017-10-29 DIAGNOSIS — J440 Chronic obstructive pulmonary disease with acute lower respiratory infection: Secondary | ICD-10-CM | POA: Diagnosis not present

## 2017-10-29 DIAGNOSIS — L89120 Pressure ulcer of left upper back, unstageable: Secondary | ICD-10-CM | POA: Diagnosis not present

## 2017-10-29 DIAGNOSIS — J209 Acute bronchitis, unspecified: Secondary | ICD-10-CM | POA: Diagnosis not present

## 2017-10-29 DIAGNOSIS — L89130 Pressure ulcer of right lower back, unstageable: Secondary | ICD-10-CM | POA: Diagnosis not present

## 2017-10-29 DIAGNOSIS — J690 Pneumonitis due to inhalation of food and vomit: Secondary | ICD-10-CM | POA: Diagnosis not present

## 2017-10-29 NOTE — Telephone Encounter (Signed)
Spoke with Patty, she states pt's blood pressure is 90/50 when he stands up. He has an appt with SN tomorrow at 9 am. She wants to know if he should take his carvedilol tonight? SN please advise.   Per SN, skip carvedilol tonight. Pt's daughter aware of recommendations. Nothing further is needed.

## 2017-10-29 NOTE — Telephone Encounter (Signed)
[facility] calling to question if the pt has restrictions with his back. Was told he has multiple fractures. Questions if he should be in a brace and what physical limitations are. Please call.

## 2017-10-29 NOTE — Telephone Encounter (Signed)
[facility] calling to question if the pt has restrictions with his back. Was told he has multiple fractures. Questions if he should be in a brace and what physical limitations are. ----Please advise

## 2017-10-30 ENCOUNTER — Telehealth: Payer: Self-pay | Admitting: Pulmonary Disease

## 2017-10-30 ENCOUNTER — Ambulatory Visit (INDEPENDENT_AMBULATORY_CARE_PROVIDER_SITE_OTHER)
Admission: RE | Admit: 2017-10-30 | Discharge: 2017-10-30 | Disposition: A | Discharge status: Home / Self Care | Payer: Medicare Other | Source: Ambulatory Visit | Attending: Pulmonary Disease | Admitting: Pulmonary Disease

## 2017-10-30 ENCOUNTER — Encounter: Payer: Self-pay | Admitting: Pulmonary Disease

## 2017-10-30 ENCOUNTER — Ambulatory Visit (INDEPENDENT_AMBULATORY_CARE_PROVIDER_SITE_OTHER): Payer: Medicare Other | Admitting: Pulmonary Disease

## 2017-10-30 ENCOUNTER — Other Ambulatory Visit (INDEPENDENT_AMBULATORY_CARE_PROVIDER_SITE_OTHER): Payer: Medicare Other

## 2017-10-30 VITALS — BP 88/52 | HR 65 | Temp 98.0°F | Ht 71.0 in | Wt 156.8 lb

## 2017-10-30 DIAGNOSIS — J449 Chronic obstructive pulmonary disease, unspecified: Secondary | ICD-10-CM

## 2017-10-30 DIAGNOSIS — Z9861 Coronary angioplasty status: Secondary | ICD-10-CM

## 2017-10-30 DIAGNOSIS — I255 Ischemic cardiomyopathy: Secondary | ICD-10-CM

## 2017-10-30 DIAGNOSIS — M159 Polyosteoarthritis, unspecified: Secondary | ICD-10-CM

## 2017-10-30 DIAGNOSIS — I272 Pulmonary hypertension, unspecified: Secondary | ICD-10-CM | POA: Diagnosis not present

## 2017-10-30 DIAGNOSIS — Z95810 Presence of automatic (implantable) cardiac defibrillator: Secondary | ICD-10-CM

## 2017-10-30 DIAGNOSIS — J209 Acute bronchitis, unspecified: Secondary | ICD-10-CM | POA: Diagnosis not present

## 2017-10-30 DIAGNOSIS — L8990 Pressure ulcer of unspecified site, unspecified stage: Secondary | ICD-10-CM

## 2017-10-30 DIAGNOSIS — L89120 Pressure ulcer of left upper back, unstageable: Secondary | ICD-10-CM | POA: Diagnosis not present

## 2017-10-30 DIAGNOSIS — R05 Cough: Secondary | ICD-10-CM | POA: Diagnosis not present

## 2017-10-30 DIAGNOSIS — I2510 Atherosclerotic heart disease of native coronary artery without angina pectoris: Secondary | ICD-10-CM

## 2017-10-30 DIAGNOSIS — J440 Chronic obstructive pulmonary disease with acute lower respiratory infection: Secondary | ICD-10-CM | POA: Diagnosis not present

## 2017-10-30 DIAGNOSIS — I10 Essential (primary) hypertension: Secondary | ICD-10-CM | POA: Diagnosis not present

## 2017-10-30 DIAGNOSIS — J90 Pleural effusion, not elsewhere classified: Secondary | ICD-10-CM

## 2017-10-30 DIAGNOSIS — L89130 Pressure ulcer of right lower back, unstageable: Secondary | ICD-10-CM | POA: Diagnosis not present

## 2017-10-30 DIAGNOSIS — R1319 Other dysphagia: Secondary | ICD-10-CM

## 2017-10-30 DIAGNOSIS — J189 Pneumonia, unspecified organism: Secondary | ICD-10-CM | POA: Diagnosis not present

## 2017-10-30 DIAGNOSIS — M150 Primary generalized (osteo)arthritis: Secondary | ICD-10-CM | POA: Diagnosis not present

## 2017-10-30 DIAGNOSIS — I5042 Chronic combined systolic (congestive) and diastolic (congestive) heart failure: Secondary | ICD-10-CM

## 2017-10-30 DIAGNOSIS — J690 Pneumonitis due to inhalation of food and vomit: Secondary | ICD-10-CM | POA: Diagnosis not present

## 2017-10-30 LAB — COMPLETE METABOLIC PANEL WITH GFR
AG RATIO: 0.8 (calc) — AB (ref 1.0–2.5)
ALT: 26 U/L (ref 9–46)
AST: 37 U/L — AB (ref 10–35)
Albumin: 2.7 g/dL — ABNORMAL LOW (ref 3.6–5.1)
Alkaline phosphatase (APISO): 86 U/L (ref 40–115)
BUN: 14 mg/dL (ref 7–25)
CALCIUM: 8.5 mg/dL — AB (ref 8.6–10.3)
CO2: 25 mmol/L (ref 20–32)
CREATININE: 1.06 mg/dL (ref 0.70–1.11)
Chloride: 98 mmol/L (ref 98–110)
GFR, EST AFRICAN AMERICAN: 74 mL/min/{1.73_m2} (ref >=60)
GFR, EST NON AFRICAN AMERICAN: 64 mL/min/{1.73_m2} (ref >=60)
GLUCOSE: 78 mg/dL (ref 65–99)
Globulin: 3.6 g/dL (calc) (ref 1.9–3.7)
Potassium: 4.6 mmol/L (ref 3.5–5.3)
Sodium: 134 mmol/L — ABNORMAL LOW (ref 135–146)
TOTAL PROTEIN: 6.3 g/dL (ref 6.1–8.1)
Total Bilirubin: 0.3 mg/dL (ref 0.2–1.2)

## 2017-10-30 LAB — CBC WITH DIFFERENTIAL/PLATELET
BASOS ABS: 0.1 10*3/uL (ref 0.0–0.1)
Basophils Relative: 1.4 % (ref 0.0–3.0)
EOS ABS: 0.3 10*3/uL (ref 0.0–0.7)
Eosinophils Relative: 4.1 % (ref 0.0–5.0)
HCT: 38.1 % — ABNORMAL LOW (ref 39.0–52.0)
Hemoglobin: 12.7 g/dL — ABNORMAL LOW (ref 13.0–17.0)
Lymphocytes Relative: 23.5 % (ref 12.0–46.0)
Lymphs Abs: 1.8 10*3/uL (ref 0.7–4.0)
MCHC: 33.3 g/dL (ref 30.0–36.0)
MCV: 96.4 fl (ref 78.0–100.0)
MONOS PCT: 8.5 % (ref 3.0–12.0)
Monocytes Absolute: 0.6 10*3/uL (ref 0.1–1.0)
NEUTROS PCT: 62.5 % (ref 43.0–77.0)
Neutro Abs: 4.7 10*3/uL (ref 1.4–7.7)
Platelets: 237 10*3/uL (ref 150.0–400.0)
RBC: 3.95 Mil/uL — AB (ref 4.22–5.81)
RDW: 13.7 % (ref 11.5–15.5)
WBC: 7.6 10*3/uL (ref 4.0–10.5)

## 2017-10-30 LAB — SEDIMENTATION RATE: SED RATE: 64 mm/h — AB (ref 0–20)

## 2017-10-30 NOTE — Telephone Encounter (Signed)
Dr. Lenna Gilford aware during appt this morning. Nothing further needed.
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
## 2017-10-30 NOTE — Telephone Encounter (Signed)
lmtcb X1 for Jim at [facility].

## 2017-10-30 NOTE — Patient Instructions (Signed)
Today we updated your med list in our EPIC system...    Continue your current medications the same...  Check BP at home twice daily...  Let's plan to HOLD the Coreg & Lasix if his BP is less than 811 systolic...    Restart these meds as his BP improves...  Today we checked a follow up CXR, blood work and an ambulatory oximetry test... We will arrange for an overnight oximetry test as well...    We will contact you w/ the results when available...   Gradually increase your activit ylevel as able...  Use great care w/ eating/ swallowing...  Call for any questions...  Let's plan a follow up visit in 2weeks, sooner if needed for problems.Marland KitchenMarland Kitchen

## 2017-10-30 NOTE — Telephone Encounter (Signed)
Spoke with Ebony Hail. She stated that the patient was downgraded to nectar thick liquids after his most recent hospital stay. Patient has refused the nectar thick liquids. Per Ebony Hail, the patient and his daughter are aware of the risks and have been warned that the aspiration pneumonia would come back.   Patient has an appt with SN this morning. Will forward to SN to let him know.

## 2017-10-31 NOTE — Telephone Encounter (Signed)
lmtcb x1 for Jim with [facility] 

## 2017-10-31 NOTE — Telephone Encounter (Signed)
Pt is calling back [phone]

## 2017-11-01 ENCOUNTER — Telehealth: Payer: Self-pay | Admitting: Pulmonary Disease

## 2017-11-01 DIAGNOSIS — I255 Ischemic cardiomyopathy: Secondary | ICD-10-CM | POA: Diagnosis not present

## 2017-11-01 DIAGNOSIS — J209 Acute bronchitis, unspecified: Secondary | ICD-10-CM | POA: Diagnosis not present

## 2017-11-01 DIAGNOSIS — J440 Chronic obstructive pulmonary disease with acute lower respiratory infection: Secondary | ICD-10-CM | POA: Diagnosis not present

## 2017-11-01 DIAGNOSIS — L89130 Pressure ulcer of right lower back, unstageable: Secondary | ICD-10-CM | POA: Diagnosis not present

## 2017-11-01 DIAGNOSIS — L89120 Pressure ulcer of left upper back, unstageable: Secondary | ICD-10-CM | POA: Diagnosis not present

## 2017-11-01 DIAGNOSIS — J690 Pneumonitis due to inhalation of food and vomit: Secondary | ICD-10-CM | POA: Diagnosis not present

## 2017-11-01 NOTE — Telephone Encounter (Signed)
Spoke with pt's daughter [name] to [employer] [employer].  Nothing further needed.

## 2017-11-01 NOTE — Telephone Encounter (Signed)
Spoke with Clair Gulling, he states pt states a walking test in the office and the pt states he was supposed to be getting O2.Marland Kitchen He doesn't have any details on and  if we are going to order oxygen. I see the qualifying walk in the chart but want to make sure SN wants to send order. If so, sent to [facility] because he already has PT, speech therapy, and nursing services from them. Please advise.    PT Speech therapy nurse

## 2017-11-01 NOTE — Telephone Encounter (Signed)
Spoke with [name]; CB phone [phone] or cell [phone] Patient has O2 in home needs to know what liter and how to use O2 Clair Gulling advised at rest patient O2 is at 95%; at exertion runs 83%-88% Patient BP sitting is at 110/80; standing is 85/65 reports light headed Clair Gulling and [name] states this is chronic concern and issue in last 2 weeks since hospital visit  SN please advise  Current Outpatient Medications on File Prior to Visit  Medication Sig Dispense Refill  . ADVAIR DISKUS 250-50 MCG/DOSE AEPB Inhale 1 puff into the lungs 2 (two) times daily.  4  . albuterol (PROVENTIL HFA;VENTOLIN HFA) 108 (90 Base) MCG/ACT inhaler Inhale 2 puffs into the lungs every 6 (six) hours as needed for wheezing or shortness of breath. 1 Inhaler 2  . alendronate (FOSAMAX) 70 MG tablet Take 1 tablet (70 mg total) by mouth once a week. Take with a full glass of water on an empty stomach. (Patient not taking: Reported on 10/30/2017) 4 tablet 5  . allopurinol (ZYLOPRIM) 100 MG tablet TAKE 1 TABLET ONCE DAILY. 30 tablet 0  . amiodarone (PACERONE) 200 MG tablet TAKE 1 TABLET ONCE DAILY. 30 tablet 0  . amoxicillin-clavulanate (AUGMENTIN) 875-125 MG tablet Take 1 tablet by mouth every 12 (twelve) hours. 14 tablet 0  . carvedilol (COREG) 3.125 MG tablet TAKE 1 TABLET TWICE DAILY WITH A MEAL. 60 tablet 6  . cholecalciferol (VITAMIN D) 1000 units tablet Take 1,000 Units by mouth daily.    . clopidogrel (PLAVIX) 75 MG tablet TAKE 1 TABLET ONCE DAILY. 30 tablet 5  . Fluticasone-Salmeterol (AIRDUO RESPICLICK 902/40) 973-53 MCG/ACT AEPB Inhale 2 puffs into the lungs 2 (two) times daily. 1 each 0  . furosemide (LASIX) 20 MG tablet Take 1 tablet (20 mg total) by mouth daily. 30 tablet 0  . guaiFENesin (MUCINEX) 600 MG 12 hr tablet Take 600-1,200 mg by mouth 2 (two) times daily. Takes two in the morning and one in the evening.    Marland Kitchen levothyroxine (SYNTHROID, LEVOTHROID) 25 MCG tablet TAKE 1 TABLET IN THE MORNING ON AN EMPTY STOMACH. 30  tablet 6  . magnesium oxide (MAG-OX) 400 (241.3 Mg) MG tablet Take 0.5 tablets (200 mg total) by mouth daily. 30 tablet 0  . Maltodextrin-Xanthan Gum (RESOURCE THICKENUP CLEAR) POWD Take 120 g by mouth as needed. 1 Can 0  . Multiple Vitamin (MULTIVITAMIN) capsule Take 1 capsule by mouth daily.    . Multiple Vitamins-Minerals (PRESERVISION/LUTEIN) CAPS Take 1 capsule by mouth 2 (two) times daily.     Marland Kitchen oxybutynin (DITROPAN) 5 MG tablet TAKE 1 TABLET BY MOUTH TWICE DAILY. 60 tablet 0  . oxybutynin (DITROPAN) 5 MG tablet TAKE 1 TABLET BY MOUTH TWICE DAILY. 60 tablet 0  . polyethylene glycol powder (GLYCOLAX/MIRALAX) powder DISSOLVE ONE CAPFUL IN 8 0Z. WATER TWICE DAILY. 527 g 3  . potassium chloride SA (K-DUR,KLOR-CON) 20 MEQ tablet TAKE 1 TABLET DAILY AS NEEDED. (TAKE EACH TIME YOU TAKE FUROSEMIDE) (Patient taking differently: Take one tablet by mouth every other day.) 30 tablet 6  . pravastatin (PRAVACHOL) 40 MG tablet TAKE 1 TABLET ONCE DAILY. 90 tablet 0  . sertraline (ZOLOFT) 50 MG tablet TAKE ONE TABLET AT BEDTIME. 30 tablet 2  . thiamine 100 MG tablet Take 1 tablet (100 mg total) by mouth daily. 30 tablet 0  . traMADol (ULTRAM) 50 MG tablet Take 1 tablet (50 mg total) by mouth every 6 (six) hours as needed for severe pain. 30 tablet 0  No current facility-administered medications on file prior to visit.     Allergies  Allergen Reactions  . Lisinopril     BP dropped too low per daughter

## 2017-11-01 NOTE — Telephone Encounter (Signed)
Clair Gulling called back stating he is at the patient's home now and patient states he has O2 in the home already but does not use it. Patient is telling Clair Gulling that he will use O2 if Dr. Lenna Gilford recommends that he use this. Also, patient had a fall in the home yesterday, stood up from the commode and fell and re-injured back.  Clair Gulling states in the process of reassessment of this.  Clair Gulling CB is [phone].  Clair Gulling requests that whatever is determined, to please contact patient's daughter, [name] at home # [phone] so that there is no confusion.

## 2017-11-01 NOTE — Telephone Encounter (Signed)
Pt called and oxygen order clarified. Pt needs 2L with exertion.

## 2017-11-01 NOTE — Telephone Encounter (Signed)
LMTCB #3

## 2017-11-01 NOTE — Telephone Encounter (Signed)
Per Sn about the blood pressure.   He can hold the coreg if his BP is equal to or greater than 100.  Stop Lasix-due to weight loss and lack of edema   Called Cindy and LM to cal back to let her know.

## 2017-11-04 ENCOUNTER — Telehealth: Payer: Self-pay | Admitting: Pulmonary Disease

## 2017-11-04 ENCOUNTER — Telehealth (INDEPENDENT_AMBULATORY_CARE_PROVIDER_SITE_OTHER): Payer: Self-pay | Admitting: Radiology

## 2017-11-04 DIAGNOSIS — J440 Chronic obstructive pulmonary disease with acute lower respiratory infection: Secondary | ICD-10-CM | POA: Diagnosis not present

## 2017-11-04 DIAGNOSIS — J209 Acute bronchitis, unspecified: Secondary | ICD-10-CM | POA: Diagnosis not present

## 2017-11-04 DIAGNOSIS — J690 Pneumonitis due to inhalation of food and vomit: Secondary | ICD-10-CM | POA: Diagnosis not present

## 2017-11-04 DIAGNOSIS — L89130 Pressure ulcer of right lower back, unstageable: Secondary | ICD-10-CM | POA: Diagnosis not present

## 2017-11-04 DIAGNOSIS — L89120 Pressure ulcer of left upper back, unstageable: Secondary | ICD-10-CM | POA: Diagnosis not present

## 2017-11-04 DIAGNOSIS — I255 Ischemic cardiomyopathy: Secondary | ICD-10-CM | POA: Diagnosis not present

## 2017-11-04 DIAGNOSIS — J449 Chronic obstructive pulmonary disease, unspecified: Secondary | ICD-10-CM

## 2017-11-04 NOTE — Telephone Encounter (Signed)
lmtcb for [employer]. See phone note from 1.9.2019 regarding recs from SN and pt's BP and O2 needs.

## 2017-11-04 NOTE — Telephone Encounter (Signed)
Called and spoke to [name] with [facility] PT. [name] states the pt's daughter is in need of some support in the home with the pt and care giving. [name] is requesting a social work eval to hopefully acquire some help with the pt at home. [name] also wanted clarification on how pt should be wearing his O2, advised [name] that per pt's chart he should be wearing O2 on exertion and can be on RA at rest. [name] verbalized understanding, also advised [name] of SN's recs regarding pt's BP (per phone note from 1.9.19).   [name], please advise if ok to give verbal order for social work eval for pt. Thanks.

## 2017-11-04 NOTE — Telephone Encounter (Signed)
Jim called [facility] Triage that he is still needing info on patient's past back fractures, and needs to know if patient needs to be wearing a back brace?  He left a message on 10/29/17.

## 2017-11-04 NOTE — Telephone Encounter (Signed)
Social work evaluation placed. Jim with [facility] called and informed.

## 2017-11-04 NOTE — Telephone Encounter (Signed)
[name] called [facility] Triage that he is still needing info on patient's past back fractures, and needs to know if patient needs to be wearing a back brace?  He left a message on 10/29/17.  Patient is also having issues with orthostatic hypotension, falls, and O2 desaturation.  [name] is working with other docs on these issues as well, he is needing a call back to him about the patient's h/o fractures.

## 2017-11-04 NOTE — Telephone Encounter (Signed)
Clair Gulling, [facility], returning call. Cb is [phone].

## 2017-11-05 ENCOUNTER — Telehealth: Payer: Self-pay | Admitting: Pulmonary Disease

## 2017-11-05 DIAGNOSIS — L89120 Pressure ulcer of left upper back, unstageable: Secondary | ICD-10-CM | POA: Diagnosis not present

## 2017-11-05 DIAGNOSIS — J440 Chronic obstructive pulmonary disease with acute lower respiratory infection: Secondary | ICD-10-CM | POA: Diagnosis not present

## 2017-11-05 DIAGNOSIS — I255 Ischemic cardiomyopathy: Secondary | ICD-10-CM | POA: Diagnosis not present

## 2017-11-05 DIAGNOSIS — J690 Pneumonitis due to inhalation of food and vomit: Secondary | ICD-10-CM | POA: Diagnosis not present

## 2017-11-05 DIAGNOSIS — J209 Acute bronchitis, unspecified: Secondary | ICD-10-CM | POA: Diagnosis not present

## 2017-11-05 DIAGNOSIS — L89130 Pressure ulcer of right lower back, unstageable: Secondary | ICD-10-CM | POA: Diagnosis not present

## 2017-11-05 MED ORDER — LEVOFLOXACIN 500 MG PO TABS
500.0000 mg | ORAL_TABLET | Freq: Every day | ORAL | 0 refills | Status: DC
Start: 2017-11-05 — End: 2017-11-13

## 2017-11-05 MED ORDER — ALIGN 4 MG PO CAPS: 4.0000 mg | ORAL_CAPSULE | Freq: Every day | ORAL | 0 refills | Status: DC

## 2017-11-05 NOTE — Telephone Encounter (Signed)
[facility] was on hold for over 10 minutes. Will call back in the morning.

## 2017-11-05 NOTE — Telephone Encounter (Signed)
Per SN---   Increase in cough and discolored sputum---call in levaquin 500 mg  1 daily x 10 days and take the align once daily as well.    I have called the pt and spoke with him and he is aware of meds that have been sent to the pharmacy.  Nothing further is needed.

## 2017-11-05 NOTE — Telephone Encounter (Signed)
Called and spoke with Ebony Hail, speech therapist  from [facility] and she stated that when she went to see the pt today---he has not been using the thicken mix for his liquids but did agree to start using this for his meals only.  She stated that the pt did have more of a cough today, that was more than normal for him and he did say that he was coughing up green/brown sputum.  Denies any fever, oxygen levels are good and vitals were good today.   Daughter stated that his cough did get better but has gradually gotten worse over the last week.  Ebony Hail feels that this may be the start to another URI.  SN please advise. Thanks  Allergies  Allergen Reactions  . Lisinopril     BP dropped too low per daughter

## 2017-11-06 ENCOUNTER — Telehealth: Payer: Self-pay | Admitting: Pulmonary Disease

## 2017-11-06 DIAGNOSIS — J690 Pneumonitis due to inhalation of food and vomit: Secondary | ICD-10-CM | POA: Diagnosis not present

## 2017-11-06 DIAGNOSIS — I255 Ischemic cardiomyopathy: Secondary | ICD-10-CM | POA: Diagnosis not present

## 2017-11-06 DIAGNOSIS — J440 Chronic obstructive pulmonary disease with acute lower respiratory infection: Secondary | ICD-10-CM | POA: Diagnosis not present

## 2017-11-06 DIAGNOSIS — L89130 Pressure ulcer of right lower back, unstageable: Secondary | ICD-10-CM | POA: Diagnosis not present

## 2017-11-06 DIAGNOSIS — L89120 Pressure ulcer of left upper back, unstageable: Secondary | ICD-10-CM | POA: Diagnosis not present

## 2017-11-06 DIAGNOSIS — J209 Acute bronchitis, unspecified: Secondary | ICD-10-CM | POA: Diagnosis not present

## 2017-11-06 NOTE — Telephone Encounter (Signed)
Brook from [facility] is calling back [phone]

## 2017-11-06 NOTE — Telephone Encounter (Signed)
Pharmacy notified regarding medication interaction between amio and levaquin. Dr. Lenna Gilford made aware and is okay with preceding with medications. Pharmacy made aware and nothing further needed.

## 2017-11-06 NOTE — Telephone Encounter (Signed)
Spoke with Clair Gulling. Advised him that the pharmacy had been contacted this morning in regards to the medication interaction. Nothing else needed at time of call.

## 2017-11-06 NOTE — Telephone Encounter (Signed)
Spoke with Brooke at [facility]  She states that there is an interaction with the levaquin and amiodarone- prolonged QT interval  Please advise, thanks!  Allergies  Allergen Reactions  . Lisinopril     BP dropped too low per daughter

## 2017-11-07 ENCOUNTER — Other Ambulatory Visit: Payer: Self-pay | Admitting: Pulmonary Disease

## 2017-11-07 DIAGNOSIS — J209 Acute bronchitis, unspecified: Secondary | ICD-10-CM | POA: Diagnosis not present

## 2017-11-07 DIAGNOSIS — J440 Chronic obstructive pulmonary disease with acute lower respiratory infection: Secondary | ICD-10-CM | POA: Diagnosis not present

## 2017-11-07 DIAGNOSIS — I255 Ischemic cardiomyopathy: Secondary | ICD-10-CM | POA: Diagnosis not present

## 2017-11-07 DIAGNOSIS — L89130 Pressure ulcer of right lower back, unstageable: Secondary | ICD-10-CM | POA: Diagnosis not present

## 2017-11-07 DIAGNOSIS — J690 Pneumonitis due to inhalation of food and vomit: Secondary | ICD-10-CM | POA: Diagnosis not present

## 2017-11-07 DIAGNOSIS — L89120 Pressure ulcer of left upper back, unstageable: Secondary | ICD-10-CM | POA: Diagnosis not present

## 2017-11-11 ENCOUNTER — Telehealth: Payer: Self-pay | Admitting: Pulmonary Disease

## 2017-11-11 DIAGNOSIS — J690 Pneumonitis due to inhalation of food and vomit: Secondary | ICD-10-CM | POA: Diagnosis not present

## 2017-11-11 DIAGNOSIS — I255 Ischemic cardiomyopathy: Secondary | ICD-10-CM | POA: Diagnosis not present

## 2017-11-11 DIAGNOSIS — J449 Chronic obstructive pulmonary disease, unspecified: Secondary | ICD-10-CM | POA: Diagnosis not present

## 2017-11-11 DIAGNOSIS — L89130 Pressure ulcer of right lower back, unstageable: Secondary | ICD-10-CM | POA: Diagnosis not present

## 2017-11-11 DIAGNOSIS — R0902 Hypoxemia: Secondary | ICD-10-CM | POA: Diagnosis not present

## 2017-11-11 DIAGNOSIS — L89120 Pressure ulcer of left upper back, unstageable: Secondary | ICD-10-CM | POA: Diagnosis not present

## 2017-11-11 DIAGNOSIS — J209 Acute bronchitis, unspecified: Secondary | ICD-10-CM | POA: Diagnosis not present

## 2017-11-11 DIAGNOSIS — J440 Chronic obstructive pulmonary disease with acute lower respiratory infection: Secondary | ICD-10-CM | POA: Diagnosis not present

## 2017-11-11 NOTE — Telephone Encounter (Addendum)
  Patty with [facility] is aware of below message and voiced her understanding. Patty states pt had another fall today and injured his right wrist. Chong Sicilian is requesting that pt be seen tomorrow verses Wednesday. I have spoken with pt's daughter, [name]. Pt has been scheduled for OV with SN 11/12/17 at 9:30.  Chong Sicilian is aware of scheduled apt.  Nothing further is needed.     11/11/17 11:05 AM  Note    Per SN: Take BP STANDING.Marland KitchenMarland KitchenIf SBP<100 when standing, hold coreg, lasix and potassium.   Pt called and informed as well as cindy, daughter. All questions answered and nothing further needed.

## 2017-11-11 NOTE — Telephone Encounter (Signed)
Patient reports he has not been using his oxygen. He understands he only uses it with significant exertion. Patient has appt with with Dr. Lenna Gilford on Wed, 01/23. Patient reports increased choking the last day or so.

## 2017-11-11 NOTE — Telephone Encounter (Signed)
Per SN: Take BP STANDING.Marland KitchenMarland KitchenIf SBP<100 when standing, hold coreg, lasix and potassium.   Pt called and informed as well as cindy, daughter. All questions answered and nothing further needed.

## 2017-11-11 NOTE — Telephone Encounter (Signed)
Spoke to [place] with [facility]. Clair Gulling states had a fall last night with trauma to head, left arm and leg. Patient declined recommendations for ER visit. Patient stated his legs got weak when he stood. Today BP sitting 102/68, standing 82/60 with mild dizziness. Clair Gulling states he is currently at pt's home.  Pt states he has not been wearing oxygen 24/7, as pt was under the impression to wear oxygen only with significant exertion . Pt also states he has had increased chocking with liquids x2d. Jims states pt has declined to use thickening advise, as recommended by speech therapy.   Pt has upcoming apt with SN on 11/13/17.   SN please advise. Thanks.   Current Outpatient Medications on File Prior to Visit  Medication Sig Dispense Refill  . ADVAIR DISKUS 250-50 MCG/DOSE AEPB Inhale 1 puff into the lungs 2 (two) times daily.  4  . albuterol (PROVENTIL HFA;VENTOLIN HFA) 108 (90 Base) MCG/ACT inhaler Inhale 2 puffs into the lungs every 6 (six) hours as needed for wheezing or shortness of breath. 1 Inhaler 2  . alendronate (FOSAMAX) 70 MG tablet Take 1 tablet (70 mg total) by mouth once a week. Take with a full glass of water on an empty stomach. (Patient not taking: Reported on 10/30/2017) 4 tablet 5  . allopurinol (ZYLOPRIM) 100 MG tablet TAKE 1 TABLET ONCE DAILY. 30 tablet 0  . amiodarone (PACERONE) 200 MG tablet TAKE 1 TABLET ONCE DAILY. 30 tablet 0  . amoxicillin-clavulanate (AUGMENTIN) 875-125 MG tablet Take 1 tablet by mouth every 12 (twelve) hours. 14 tablet 0  . carvedilol (COREG) 3.125 MG tablet TAKE 1 TABLET TWICE DAILY WITH A MEAL. 60 tablet 6  . cholecalciferol (VITAMIN D) 1000 units tablet Take 1,000 Units by mouth daily.    . clopidogrel (PLAVIX) 75 MG tablet TAKE 1 TABLET ONCE DAILY. 30 tablet 5  . Fluticasone-Salmeterol (AIRDUO RESPICLICK 154/00) 867-61 MCG/ACT AEPB Inhale 2 puffs into the lungs 2 (two) times daily. 1 each 0  . furosemide (LASIX) 20 MG tablet Take 1 tablet (20 mg total) by mouth daily. 30  tablet 0  . guaiFENesin (MUCINEX) 600 MG 12 hr tablet Take 600-1,200 mg by mouth 2 (two) times daily. Takes two in the morning and one in the evening.    Marland Kitchen levofloxacin (LEVAQUIN) 500 MG tablet Take 1 tablet (500 mg total) by mouth daily. 10 tablet 0  . levothyroxine (SYNTHROID, LEVOTHROID) 25 MCG tablet TAKE 1 TABLET IN THE MORNING ON AN EMPTY STOMACH. 30 tablet 6  . magnesium oxide (MAG-OX) 400 (241.3 Mg) MG tablet Take 0.5 tablets (200 mg total) by mouth daily. 30 tablet 0  . Maltodextrin-Xanthan Gum (RESOURCE THICKENUP CLEAR) POWD Take 120 g by mouth as needed. 1 Can 0  . Multiple Vitamin (MULTIVITAMIN) capsule Take 1 capsule by mouth daily.    . Multiple Vitamins-Minerals (PRESERVISION/LUTEIN) CAPS Take 1 capsule by mouth 2 (two) times daily.     Marland Kitchen oxybutynin (DITROPAN) 5 MG tablet TAKE 1 TABLET BY MOUTH TWICE DAILY. 60 tablet 0  . oxybutynin (DITROPAN) 5 MG tablet TAKE 1 TABLET BY MOUTH TWICE DAILY. 60 tablet 0  . polyethylene glycol powder (GLYCOLAX/MIRALAX) powder DISSOLVE ONE CAPFUL IN 8 0Z. WATER TWICE DAILY. 527 g 3  . potassium chloride SA (K-DUR,KLOR-CON) 20 MEQ tablet TAKE 1 TABLET DAILY AS NEEDED. (TAKE EACH TIME YOU TAKE FUROSEMIDE) (Patient taking differently: Take one tablet by mouth every other day.) 30 tablet 6  . pravastatin (PRAVACHOL) 40 MG tablet TAKE 1  TABLET ONCE DAILY. 90 tablet 0  . Probiotic Product (ALIGN) 4 MG CAPS Take 1 capsule (4 mg total) by mouth daily. 20 capsule 0  . sertraline (ZOLOFT) 50 MG tablet TAKE ONE TABLET AT BEDTIME. 30 tablet 2  . thiamine 100 MG tablet Take 1 tablet (100 mg total) by mouth daily. 30 tablet 0  . traMADol (ULTRAM) 50 MG tablet Take 1 tablet (50 mg total) by mouth every 6 (six) hours as needed for severe pain. 30 tablet 0   No current facility-administered medications on file prior to visit.     Allergies  Allergen Reactions  . Lisinopril     BP dropped too low per daughter

## 2017-11-12 ENCOUNTER — Ambulatory Visit (INDEPENDENT_AMBULATORY_CARE_PROVIDER_SITE_OTHER): Payer: Self-pay

## 2017-11-12 ENCOUNTER — Encounter: Payer: Self-pay | Admitting: Cardiology

## 2017-11-12 ENCOUNTER — Telehealth: Payer: Self-pay

## 2017-11-12 ENCOUNTER — Ambulatory Visit (INDEPENDENT_AMBULATORY_CARE_PROVIDER_SITE_OTHER): Payer: Medicare Other | Admitting: Pulmonary Disease

## 2017-11-12 ENCOUNTER — Telehealth: Payer: Self-pay | Admitting: Pulmonary Disease

## 2017-11-12 ENCOUNTER — Encounter: Payer: Self-pay | Admitting: Pulmonary Disease

## 2017-11-12 VITALS — BP 90/70 | HR 64

## 2017-11-12 DIAGNOSIS — Z95810 Presence of automatic (implantable) cardiac defibrillator: Secondary | ICD-10-CM | POA: Diagnosis not present

## 2017-11-12 DIAGNOSIS — J90 Pleural effusion, not elsewhere classified: Secondary | ICD-10-CM

## 2017-11-12 DIAGNOSIS — J449 Chronic obstructive pulmonary disease, unspecified: Secondary | ICD-10-CM

## 2017-11-12 DIAGNOSIS — W19XXXA Unspecified fall, initial encounter: Secondary | ICD-10-CM | POA: Insufficient documentation

## 2017-11-12 DIAGNOSIS — I255 Ischemic cardiomyopathy: Secondary | ICD-10-CM

## 2017-11-12 DIAGNOSIS — I272 Pulmonary hypertension, unspecified: Secondary | ICD-10-CM | POA: Diagnosis not present

## 2017-11-12 DIAGNOSIS — L89130 Pressure ulcer of right lower back, unstageable: Secondary | ICD-10-CM | POA: Diagnosis not present

## 2017-11-12 DIAGNOSIS — R531 Weakness: Secondary | ICD-10-CM | POA: Insufficient documentation

## 2017-11-12 DIAGNOSIS — L89120 Pressure ulcer of left upper back, unstageable: Secondary | ICD-10-CM | POA: Diagnosis not present

## 2017-11-12 DIAGNOSIS — Z9861 Coronary angioplasty status: Secondary | ICD-10-CM

## 2017-11-12 DIAGNOSIS — J189 Pneumonia, unspecified organism: Secondary | ICD-10-CM

## 2017-11-12 DIAGNOSIS — R1319 Other dysphagia: Secondary | ICD-10-CM | POA: Diagnosis not present

## 2017-11-12 DIAGNOSIS — M150 Primary generalized (osteo)arthritis: Secondary | ICD-10-CM

## 2017-11-12 DIAGNOSIS — I5042 Chronic combined systolic (congestive) and diastolic (congestive) heart failure: Secondary | ICD-10-CM | POA: Diagnosis not present

## 2017-11-12 DIAGNOSIS — I2510 Atherosclerotic heart disease of native coronary artery without angina pectoris: Secondary | ICD-10-CM

## 2017-11-12 DIAGNOSIS — I951 Orthostatic hypotension: Secondary | ICD-10-CM

## 2017-11-12 DIAGNOSIS — M159 Polyosteoarthritis, unspecified: Secondary | ICD-10-CM

## 2017-11-12 DIAGNOSIS — J209 Acute bronchitis, unspecified: Secondary | ICD-10-CM | POA: Diagnosis not present

## 2017-11-12 DIAGNOSIS — J690 Pneumonitis due to inhalation of food and vomit: Secondary | ICD-10-CM | POA: Diagnosis not present

## 2017-11-12 DIAGNOSIS — J440 Chronic obstructive pulmonary disease with acute lower respiratory infection: Secondary | ICD-10-CM | POA: Diagnosis not present

## 2017-11-12 NOTE — Progress Notes (Signed)
EPIC Encounter for ICM Monitoring  Patient Name: [name] is a 84 y.o. male Date: 11/12/2017 Primary Care Physican: Noralee Space, MD Primary Cardiologist: [place] Electrophysiologist: Faustino Congress Weight:unknown       Attempted call to [name] and unable to reach.  Left detailed message regarding transmission.  Transmission reviewed.  Per telephone notes with Dr Jeannine Kitten office, patient having problems with falls, weakness, choking episodes and low BP since hospital discharge on 10/18/27.   Thoracic impedance abnormal suggesting fluid accumulation since 10/29/17. Per telephone note to Dr Jeannine Kitten office, Lasix was stopped due to weight loss and no edema.  Prescribed dosage: Furosemide 20 mg 1 tablet daily.  Potassium 20 mEq 1 tablet every day as needed (Take each time you take a Furosemide).  Labs: 10/30/2017 Creatinine 1.06, BUN 14, Potassium 4.6, Sodium 134, EGFR 64-74 10/17/2017 Creatinine 0.94, BUN 11, Potassium 3.6, Sodium 134, EGFR >60 10/16/2017 Creatinine 0.87, BUN 9,   Potassium 3.6, Sodium 135, EGFR >60 10/15/2017 Creatinine 0.89, BUN 9,   Potassium 3.8, Sodium 132, EGFR >60 10/14/2017 Creatinine 0.85, BUN 10, Potassium 3.9, Sodium 133, EGFR >60  10/13/2017 Creatinine 0.87, BUN 13, Potassium 3.6, Sodium 135, EGFR >60 10/12/2017 Creatinine 0.91, BUN 15, Potassium 2.8, Sodium 133, EGFR >60  10/11/2017 Creatinine 0.95, BUN 15, Potassium 3.2, Sodium 134, EGFR >60  10/10/2017 Creatinine 1.03, BUN 15, Potassium 3.6, Sodium 135, EGFR >60  10/09/2017 Creatinine 0.92, BUN 16, Potassium 3.7, Sodium 134, EGFR >60  10/08/2017 Creatinine 0.78, BUN 18, Potassium 3.6, Sodium 136, EGFR >60  10/07/2017 Creatinine 0.79, BUN 24, Potassium 3.9, Sodium 135, EGFR >60  10/06/2017 Creatinine 0.96, BUN 31, Potassium 3.5, Sodium 135, EGFR >60   10/05/2017 Creatinine 0.96, BUN 40, Potassium 3.7, Sodium 134, EGFR >60  10/04/2017 Creatinine 1.02, BUN 43, Potassium 2.9, Sodium 132,  EGFR >60   10/03/2017 Creatinine 0.95, BUN 40, Potassium 3.7, Sodium 131, EGFR >60   10/02/2017 Creatinine 0.96, BUN 35, Potassium 3.0, Sodium 132, EGFR >60  10/01/2017 Creatinine 1.00, BUN 32, Potassium 3.2, Sodium 131, EGFR >60   09/30/2017 Creatinine 0.98, BUN 35, Potassium 4.4, Sodium 133, EGFR >60  09/26/2017 Creatinine 1.12, BUN 32, Potassium 4.3, Sodium 137, EGFR >60  04/01/2017 Creatinine 0.94, BUN 19, Potassium 5.0, Sodium 134, [id number] 01/22/2017 Creatinine 0.94, BUN 19, Potassium 5.0, Sodium 134 01/14/2017 Creatinine 1.13, BUN 39, Potassium 3.7, Sodium 135, EGFR 58->60  01/13/2017 Creatinine 1.30, BUN 28, Potassium 5.1, Sodium 135, EGFR 49-57        A complete set of results can be found in Results Review.  Recommendations: NONE - Unable to reach.  Follow-up plan: ICM clinic phone appointment on 11/19/2017.    Copy of ICM check sent to Dr. Caryl Comes and Dr. Percival Spanish for review and recommendation if needed.   3 month ICM trend: 11/12/2017    1 Year ICM trend:       [name], RN 11/12/2017 7:53 AM

## 2017-11-12 NOTE — Telephone Encounter (Signed)
Remote ICM transmission received.  Attempted call to [name] and left detailed message per [facility] regarding transmission and next ICM scheduled for 11/19/2017.  Advised to return call for any fluid symptoms or questions.

## 2017-11-12 NOTE — Patient Instructions (Signed)
Today we updated your med list in our EPIC system...    Continue your current medications the same.Marland KitchenMarland Kitchen  1)  Keep the Lasix and potassium on HOLD for now due to the low BP when you stand up...  I would like you to have your BP checked at least twice daily, and check it standing- if the BP is <671 (systolic) then HOLD the Coreg dose as well...  We will arrange for a Cardiology f/u w/ DrHochrein or the CHF clinic ASAP...  Let's help the postural blood pressure drops by wearing support hose daily while up & about...    Take them off at night when you go to bed...  2)  In my opinion-- the weakness, falling, and gait abnormality are only going to improve with intensive physical therapy...    We will request a Physical Medicine & Rehab consult w/ Dr. Oval Linsey for his input & help in this regard...  3)  We are re-assessing your need for oxygen-- right now your oxygen saturatiions are all in the 90's at rest, and you are unable to exert enough to get short of breath;  your oxygen saturations remain in the 90's w/ the amount of walking you can do, but as you become able to do more activity this may change...  We have ordered an overnight oximetry test & we will call w/ the results when avail...  Call for any questions or if we can be of service in any way...  Let's plan a follow up visit in 42mo, sooner if needed for problems.Marland KitchenMarland Kitchen

## 2017-11-12 NOTE — Telephone Encounter (Signed)
Per SN:  Ok to use vaseline gauze, coban, and kerlex. Called Patty and left a detailed message letting her know.

## 2017-11-13 ENCOUNTER — Ambulatory Visit (INDEPENDENT_AMBULATORY_CARE_PROVIDER_SITE_OTHER): Payer: Medicare Other | Admitting: Cardiology

## 2017-11-13 ENCOUNTER — Ambulatory Visit: Payer: Medicare Other | Admitting: Pulmonary Disease

## 2017-11-13 ENCOUNTER — Telehealth: Payer: Self-pay | Admitting: Pulmonary Disease

## 2017-11-13 ENCOUNTER — Encounter: Payer: Self-pay | Admitting: Cardiology

## 2017-11-13 VITALS — BP 106/64 | HR 72

## 2017-11-13 DIAGNOSIS — I951 Orthostatic hypotension: Secondary | ICD-10-CM

## 2017-11-13 DIAGNOSIS — R5381 Other malaise: Secondary | ICD-10-CM | POA: Diagnosis not present

## 2017-11-13 DIAGNOSIS — I255 Ischemic cardiomyopathy: Secondary | ICD-10-CM | POA: Diagnosis not present

## 2017-11-13 DIAGNOSIS — Z95810 Presence of automatic (implantable) cardiac defibrillator: Secondary | ICD-10-CM | POA: Diagnosis not present

## 2017-11-13 DIAGNOSIS — J9621 Acute and chronic respiratory failure with hypoxia: Secondary | ICD-10-CM

## 2017-11-13 DIAGNOSIS — I2589 Other forms of chronic ischemic heart disease: Secondary | ICD-10-CM

## 2017-11-13 DIAGNOSIS — I5043 Acute on chronic combined systolic (congestive) and diastolic (congestive) heart failure: Secondary | ICD-10-CM | POA: Diagnosis not present

## 2017-11-13 MED ORDER — POTASSIUM CHLORIDE ER 10 MEQ PO TBCR: EXTENDED_RELEASE_TABLET | ORAL | 3 refills | Status: DC

## 2017-11-13 MED ORDER — FUROSEMIDE 20 MG PO TABS: ORAL_TABLET | ORAL | 2 refills | Status: DC

## 2017-11-13 MED ORDER — POTASSIUM CHLORIDE ER 10 MEQ PO TBCR: 10.0000 meq | EXTENDED_RELEASE_TABLET | Freq: Every day | ORAL | 3 refills | Status: DC

## 2017-11-13 MED ORDER — MIDODRINE HCL 2.5 MG PO TABS: 2.5000 mg | ORAL_TABLET | Freq: Three times a day (TID) | ORAL | 3 refills | Status: DC

## 2017-11-13 NOTE — Assessment & Plan Note (Signed)
Gen change June 2017

## 2017-11-13 NOTE — Telephone Encounter (Signed)
Spoke with Chong Sicilian, she states the daughter told her that pt started falling more since he started the Levaquin. She wanted to let SN know and to see if he wants pt to stop the medication. SN please advise.

## 2017-11-13 NOTE — Telephone Encounter (Signed)
Pt is schedule to see Kerin Ransom, PA today @ 3:30 pm

## 2017-11-13 NOTE — Assessment & Plan Note (Signed)
Significantly orthostatic. B/P by me 80 systolic while sitting.

## 2017-11-13 NOTE — Assessment & Plan Note (Signed)
Thoracic impedence increased on recent device check

## 2017-11-13 NOTE — Telephone Encounter (Signed)
Per SN:  Stop levaquin. Based on ONO: Pt needs 2L O2 at night and prn during the day.   Daughter notified and expressed understanding.

## 2017-11-13 NOTE — Patient Instructions (Addendum)
Medication Instructions:  RESTART Lasix 20mg  Take 1 tablet on these days Monday, Wednesday, and Friday  RESTART Potassium 10 meq take 1 tablet  START Midodrine 2.5mg  take 1 tablet three times a day with meals  Labwork: Your physician recommends that you return for lab work in: TODAY-BMP, CMP  Testing/Procedures: None   Follow-Up: Your physician recommends that you schedule a follow-up appointment in: 6-8 weeks with Dr Percival Spanish   Any Other Special Instructions Will Be Listed Below (If Applicable). If you need a refill on your cardiac medications before your next appointment, please call your pharmacy.
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
## 2017-11-13 NOTE — Telephone Encounter (Signed)
Pt did not answer phone. Message left. Pt needs to be given recommendations.

## 2017-11-13 NOTE — Assessment & Plan Note (Signed)
EF 30-35% by echo Dec 2018

## 2017-11-13 NOTE — Assessment & Plan Note (Signed)
The pt' daughter expressed regret they didn't arrange for SNF/ Rehab as recomended

## 2017-11-13 NOTE — Telephone Encounter (Signed)
Pt is calling back [phone]

## 2017-11-13 NOTE — Telephone Encounter (Signed)
Left voice mail on machine for patient to return phone call back regarding previous message about rehab referral. X1 Spoke with SN, our office will follow up with [place] office to try get sooner appt for pt  Dr. Tessa Lerner office phone [phone], attempted to call just now at 4:40pm office is closed  Routing message to [place] to follow up with patient's daughter [name] and with Dr. Tessa Lerner office tomorrow 11/14/17

## 2017-11-13 NOTE — Telephone Encounter (Signed)
Opened in error.  New issue, new message was entered and sent to triage.

## 2017-11-13 NOTE — Telephone Encounter (Signed)
Spoke with patient's daughter Maudry Mayhew that we called Dr. Tessa Lerner office and they were closed for the day today Advised that I routed message to [facility] nurse and we will try to keep up with the office of Dr. Tessa Lerner to schedule earlier visit rather than waiting 6-8 weeks as patient's daughter was advised by PA there today

## 2017-11-13 NOTE — Assessment & Plan Note (Signed)
Hospitalized for two weeks in December with CAP, CHF, Rt pleural effusion

## 2017-11-14 ENCOUNTER — Telehealth: Payer: Self-pay | Admitting: Pulmonary Disease

## 2017-11-14 ENCOUNTER — Encounter: Payer: Self-pay | Admitting: Pulmonary Disease

## 2017-11-14 ENCOUNTER — Other Ambulatory Visit: Payer: Self-pay | Admitting: Pulmonary Disease

## 2017-11-14 DIAGNOSIS — R531 Weakness: Secondary | ICD-10-CM

## 2017-11-14 DIAGNOSIS — W19XXXA Unspecified fall, initial encounter: Secondary | ICD-10-CM

## 2017-11-14 LAB — BASIC METABOLIC PANEL
BUN/Creatinine Ratio: 17 (ref 10–24)
BUN: 17 mg/dL (ref 8–27)
CO2: 24 mmol/L (ref 20–29)
Calcium: 8.8 mg/dL (ref 8.6–10.2)
Chloride: 98 mmol/L (ref 96–106)
Creatinine, Ser: 0.99 mg/dL (ref 0.76–1.27)
GFR calc Af Amer: 81 mL/min/{1.73_m2} (ref >59)
GFR calc non Af Amer: 70 mL/min/{1.73_m2} (ref >59)
Glucose: 96 mg/dL (ref 65–99)
Potassium: 4.5 mmol/L (ref 3.5–5.2)
Sodium: 138 mmol/L (ref 134–144)

## 2017-11-14 LAB — COMPREHENSIVE METABOLIC PANEL
ALT: 21 IU/L (ref 0–44)
AST: 27 IU/L (ref 0–40)
Albumin/Globulin Ratio: 1 — ABNORMAL LOW (ref 1.2–2.2)
Albumin: 3.2 g/dL — ABNORMAL LOW (ref 3.5–4.7)
Alkaline Phosphatase: 98 IU/L (ref 39–117)
BUN/Creatinine Ratio: 17 (ref 10–24)
BUN: 16 mg/dL (ref 8–27)
Bilirubin Total: 0.3 mg/dL (ref 0.0–1.2)
CO2: 24 mmol/L (ref 20–29)
Calcium: 8.7 mg/dL (ref 8.6–10.2)
Chloride: 98 mmol/L (ref 96–106)
Creatinine, Ser: 0.92 mg/dL (ref 0.76–1.27)
GFR calc Af Amer: 88 mL/min/{1.73_m2} (ref >59)
GFR calc non Af Amer: 76 mL/min/{1.73_m2} (ref >59)
Globulin, Total: 3.3 g/dL (ref 1.5–4.5)
Glucose: 86 mg/dL (ref 65–99)
Potassium: 4.3 mmol/L (ref 3.5–5.2)
Sodium: 136 mmol/L (ref 134–144)
Total Protein: 6.5 g/dL (ref 6.0–8.5)

## 2017-11-14 NOTE — Telephone Encounter (Addendum)
Please see phone note from 1.23.2019. Will sign off.

## 2017-11-14 NOTE — Telephone Encounter (Signed)
Family notified and all questions answered. Nothing further needed.

## 2017-11-14 NOTE — Telephone Encounter (Signed)
Called and spoke with Baldo Ash with Dr. Tessa Lerner office at rehab center at phone [phone] She advised that it takes 4-6 weeks for referral approval, another 4 week to make an appt [place] of this above information; she will make note to [place] please advise of next step in referral process

## 2017-11-14 NOTE — Telephone Encounter (Signed)
Per SN: Please order HH PT/OT 3x week. Melissa at [facility] notified. Order placed. Nothing further needed.

## 2017-11-14 NOTE — Telephone Encounter (Signed)
Per SN: Pt and daughter, [name] made aware of timeframe and that there is nothing we can do to get him in sooner. Dr. Lenna Gilford will call family to discuss other concerns related to rehab facilities. Nothing further needed.

## 2017-11-15 ENCOUNTER — Telehealth: Payer: Self-pay | Admitting: Cardiology

## 2017-11-15 ENCOUNTER — Telehealth: Payer: Self-pay | Admitting: Pulmonary Disease

## 2017-11-15 ENCOUNTER — Other Ambulatory Visit: Payer: Self-pay | Admitting: Pulmonary Disease

## 2017-11-15 DIAGNOSIS — L89130 Pressure ulcer of right lower back, unstageable: Secondary | ICD-10-CM | POA: Diagnosis not present

## 2017-11-15 DIAGNOSIS — I255 Ischemic cardiomyopathy: Secondary | ICD-10-CM | POA: Diagnosis not present

## 2017-11-15 DIAGNOSIS — J440 Chronic obstructive pulmonary disease with acute lower respiratory infection: Secondary | ICD-10-CM | POA: Diagnosis not present

## 2017-11-15 DIAGNOSIS — J209 Acute bronchitis, unspecified: Secondary | ICD-10-CM | POA: Diagnosis not present

## 2017-11-15 DIAGNOSIS — J690 Pneumonitis due to inhalation of food and vomit: Secondary | ICD-10-CM | POA: Diagnosis not present

## 2017-11-15 DIAGNOSIS — L89120 Pressure ulcer of left upper back, unstageable: Secondary | ICD-10-CM | POA: Diagnosis not present

## 2017-11-15 NOTE — Telephone Encounter (Signed)
Spoke to daughter [name] regarding FL2 form. I have filled out FL2 form to the most of my ability but it is still has missing information. Dr. Lenna Gilford called and made aware of FL2 form on his desk chair. He needs to sign it and fill out missing information. Social work with [facility] called with no answer-voicemail left.   [facility] M.(social worker with [facility])     # [phone] [name], daughter     # [phone]   Facility will be [facility] and [place]. [place] [address] [phone]  Pt needs FL2 to be done by Saturday, January 26th. Medicare will cover if admitted within 30 days from hospital discharge which was October 17, 2017.

## 2017-11-15 NOTE — Progress Notes (Addendum)
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 MCG/ACT AEPB    Inhale 2 puffs into the lungs 2 (two) times daily.   GUAIFENESIN (MUCINEX) 600 MG 12 HR TABLET    Take 600-1,200 mg by mouth 2 (two) times daily. Takes two in the morning and one in the evening.   LEVOTHYROXINE (SYNTHROID, LEVOTHROID) 25 MCG TABLET    TAKE 1 TABLET IN THE MORNING ON AN EMPTY STOMACH.   MAGNESIUM OXIDE (MAG-OX) 400 (241.3 MG) MG TABLET    Take 0.5 tablets (200 mg total) by  mouth daily.   MALTODEXTRIN-XANTHAN GUM (RESOURCE THICKENUP CLEAR) POWD    Take 120 g by mouth as needed.   MULTIPLE VITAMIN (MULTIVITAMIN) CAPSULE    Take 1 capsule by mouth daily.   MULTIPLE VITAMINS-MINERALS (PRESERVISION/LUTEIN) CAPS    Take 1 capsule by mouth 2 (two) times daily.    OXYBUTYNIN (DITROPAN) 5 MG TABLET    TAKE 1 TABLET BY MOUTH TWICE DAILY.   POLYETHYLENE GLYCOL POWDER (GLYCOLAX/MIRALAX) POWDER    DISSOLVE ONE CAPFUL IN 8 0Z. WATER TWICE DAILY.   PRAVASTATIN (PRAVACHOL) 40 MG TABLET    TAKE 1 TABLET ONCE DAILY.   PROBIOTIC PRODUCT (ALIGN) 4 MG CAPS    Take 1 capsule (4 mg total) by mouth daily.    SERTRALINE (ZOLOFT) 50 MG TABLET    TAKE ONE TABLET AT BEDTIME.   THIAMINE 100 MG TABLET    Take 1 tablet (100 mg total) by mouth daily.   TRAMADOL (ULTRAM) 50 MG TABLET    Take 1 tablet (50 mg total) by mouth every 6 (six) hours as needed for severe pain.  Modified Medications   Modified Medication Previous Medication   AMIODARONE (PACERONE) 200 MG TABLET amiodarone (PACERONE) 200 MG tablet      TAKE 1 TABLET ONCE DAILY.    TAKE 1 TABLET ONCE DAILY.   FUROSEMIDE (LASIX) 20 MG TABLET furosemide (LASIX) 20 MG tablet      Take 1 tablet Monday, Wednesday and Friday    Take 1 tablet (20 mg total) by mouth daily.   POTASSIUM CHLORIDE (K-DUR) 10 MEQ TABLET potassium chloride (K-DUR) 10 MEQ tablet      Take 1 tablet Monday, Wednesday and Friday    Take 1 tablet (10 mEq total) by mouth daily.  Discontinued Medications   ADVAIR DISKUS 250-50 MCG/DOSE AEPB    Inhale 1 puff into the lungs 2 (two) times daily.   AMOXICILLIN-CLAVULANATE (AUGMENTIN) 875-125 MG TABLET    Take 1 tablet by mouth every 12 (twelve) hours.   LEVOFLOXACIN (LEVAQUIN) 500 MG TABLET    Take 1 tablet (500 mg total) by mouth daily.   OXYBUTYNIN (DITROPAN) 5 MG TABLET    TAKE 1 TABLET BY MOUTH TWICE DAILY.   POTASSIUM CHLORIDE SA (K-DUR,KLOR-CON) 20 MEQ TABLET    TAKE 1 TABLET DAILY AS NEEDED. (TAKE EACH TIME YOU TAKE FUROSEMIDE)

## 2017-11-15 NOTE — Telephone Encounter (Signed)
New Message °

## 2017-11-15 NOTE — Telephone Encounter (Signed)
Pt is calling back [phone]

## 2017-11-15 NOTE — Telephone Encounter (Signed)
Spoke to  [employer]- physical therapist He waned to inform office that  1) A possible interaction with potassium and oxybutynin-potassium   2) family stated patient blood pressure this morning was 70/41 this morning ( no information of time frame)  blood pressure by physical therapist -Clair Gulling was 130/78  No changes with pressures from sitting to standing.  Patient recently started midodrine.    RN  CALLED AN SPOKE TO DAUGHTER CINDY - SHE STATES THE BLOOD PRESSURE  IS NOT UNUSAL FOR THE MORNING PRESSURE   -INSTRUCTED TO HAVE PATIENT DRINK A GLASS OF WATER PRIOR TO GETTING OUT OF BED IN POSSIBLE AND CONTINUE TAKING CURRENT  DOSE OD MEDICINE.  CINDY VERBALIZED UNDERSTANDING    WILL DEFER TO DOCTOR / EXTENDER ABOUT INTREACTION OF MEDICATIONS

## 2017-11-15 NOTE — Telephone Encounter (Signed)
Called and spoke with Clair Gulling with [facility] at [phone] Advised him to speak with Caryl Comes, [place] tried calling, LVM to return call today prior to 5pm

## 2017-11-15 NOTE — Telephone Encounter (Signed)
Called and spoke with pt's daughter-[name] advised that pt is needing inpatient care, form needs signed and completed today at 5pm [name] called at 4:40pm Routing message to [place]

## 2017-11-16 DIAGNOSIS — I5043 Acute on chronic combined systolic (congestive) and diastolic (congestive) heart failure: Secondary | ICD-10-CM | POA: Diagnosis not present

## 2017-11-16 DIAGNOSIS — R2681 Unsteadiness on feet: Secondary | ICD-10-CM | POA: Diagnosis not present

## 2017-11-16 DIAGNOSIS — Z5189 Encounter for other specified aftercare: Secondary | ICD-10-CM | POA: Diagnosis not present

## 2017-11-16 DIAGNOSIS — Z9181 History of falling: Secondary | ICD-10-CM | POA: Diagnosis not present

## 2017-11-16 DIAGNOSIS — L0390 Cellulitis, unspecified: Secondary | ICD-10-CM | POA: Diagnosis not present

## 2017-11-16 DIAGNOSIS — R296 Repeated falls: Secondary | ICD-10-CM | POA: Diagnosis not present

## 2017-11-16 DIAGNOSIS — R1312 Dysphagia, oropharyngeal phase: Secondary | ICD-10-CM | POA: Diagnosis not present

## 2017-11-16 DIAGNOSIS — L03012 Cellulitis of left finger: Secondary | ICD-10-CM | POA: Diagnosis not present

## 2017-11-16 DIAGNOSIS — R54 Age-related physical debility: Secondary | ICD-10-CM | POA: Diagnosis not present

## 2017-11-16 DIAGNOSIS — L89100 Pressure ulcer of unspecified part of back, unstageable: Secondary | ICD-10-CM | POA: Diagnosis not present

## 2017-11-16 DIAGNOSIS — J449 Chronic obstructive pulmonary disease, unspecified: Secondary | ICD-10-CM | POA: Diagnosis not present

## 2017-11-16 DIAGNOSIS — I951 Orthostatic hypotension: Secondary | ICD-10-CM | POA: Diagnosis not present

## 2017-11-16 DIAGNOSIS — I429 Cardiomyopathy, unspecified: Secondary | ICD-10-CM | POA: Diagnosis not present

## 2017-11-16 DIAGNOSIS — R278 Other lack of coordination: Secondary | ICD-10-CM | POA: Diagnosis not present

## 2017-11-16 DIAGNOSIS — M6281 Muscle weakness (generalized): Secondary | ICD-10-CM | POA: Diagnosis not present

## 2017-11-16 DIAGNOSIS — I255 Ischemic cardiomyopathy: Secondary | ICD-10-CM | POA: Diagnosis not present

## 2017-11-16 DIAGNOSIS — J9621 Acute and chronic respiratory failure with hypoxia: Secondary | ICD-10-CM | POA: Diagnosis not present

## 2017-11-16 DIAGNOSIS — R1314 Dysphagia, pharyngoesophageal phase: Secondary | ICD-10-CM | POA: Diagnosis not present

## 2017-11-16 NOTE — Telephone Encounter (Signed)
No change in meds. He has significant orthostasis which we are addressing.

## 2017-11-18 DIAGNOSIS — Z9181 History of falling: Secondary | ICD-10-CM | POA: Diagnosis not present

## 2017-11-18 DIAGNOSIS — I951 Orthostatic hypotension: Secondary | ICD-10-CM | POA: Diagnosis not present

## 2017-11-19 ENCOUNTER — Telehealth: Payer: Self-pay | Admitting: Cardiology

## 2017-11-19 DIAGNOSIS — L89100 Pressure ulcer of unspecified part of back, unstageable: Secondary | ICD-10-CM | POA: Diagnosis not present

## 2017-11-19 NOTE — Telephone Encounter (Signed)
[name] was this handles so we can close encounter?

## 2017-11-19 NOTE — Telephone Encounter (Signed)
Pt now admitted to SNF. Nothing further needed.

## 2017-11-19 NOTE — Telephone Encounter (Signed)
LMOVM reminding pt to send remote transmission.   

## 2017-11-20 DIAGNOSIS — J449 Chronic obstructive pulmonary disease, unspecified: Secondary | ICD-10-CM | POA: Diagnosis not present

## 2017-11-20 DIAGNOSIS — Z9181 History of falling: Secondary | ICD-10-CM | POA: Diagnosis not present

## 2017-11-20 DIAGNOSIS — R54 Age-related physical debility: Secondary | ICD-10-CM | POA: Diagnosis not present

## 2017-11-20 DIAGNOSIS — I951 Orthostatic hypotension: Secondary | ICD-10-CM | POA: Diagnosis not present

## 2017-11-20 DIAGNOSIS — R296 Repeated falls: Secondary | ICD-10-CM | POA: Diagnosis not present

## 2017-11-20 DIAGNOSIS — I255 Ischemic cardiomyopathy: Secondary | ICD-10-CM | POA: Diagnosis not present

## 2017-11-20 NOTE — Telephone Encounter (Signed)
FL2 form done and hand delivered to facility by Dr. Lenna Gilford this weekend.

## 2017-11-20 NOTE — Telephone Encounter (Signed)
[place], please advise if these forms were taken care of by Dr. Lenna Gilford. Thanks!

## 2017-11-22 DIAGNOSIS — L0390 Cellulitis, unspecified: Secondary | ICD-10-CM | POA: Diagnosis not present

## 2017-11-22 DIAGNOSIS — L89100 Pressure ulcer of unspecified part of back, unstageable: Secondary | ICD-10-CM | POA: Diagnosis not present

## 2017-11-25 ENCOUNTER — Other Ambulatory Visit: Payer: Self-pay

## 2017-11-25 ENCOUNTER — Encounter: Payer: Self-pay | Admitting: Pulmonary Disease

## 2017-11-25 DIAGNOSIS — Z9181 History of falling: Secondary | ICD-10-CM | POA: Diagnosis not present

## 2017-11-25 DIAGNOSIS — I951 Orthostatic hypotension: Secondary | ICD-10-CM | POA: Diagnosis not present

## 2017-11-25 MED ORDER — FLUCONAZOLE 100 MG PO TABS: ORAL_TABLET | ORAL | 0 refills | Status: DC

## 2017-11-25 NOTE — Telephone Encounter (Signed)
SN please advise. Thanks.   Current Outpatient Medications on File Prior to Visit  Medication Sig Dispense Refill  . albuterol (PROVENTIL HFA;VENTOLIN HFA) 108 (90 Base) MCG/ACT inhaler Inhale 2 puffs into the lungs every 6 (six) hours as needed for wheezing or shortness of breath. 1 Inhaler 2  . alendronate (FOSAMAX) 70 MG tablet Take 1 tablet (70 mg total) by mouth once a week. Take with a full glass of water on an empty stomach. 4 tablet 5  . allopurinol (ZYLOPRIM) 100 MG tablet TAKE 1 TABLET ONCE DAILY. 30 tablet 0  . amiodarone (PACERONE) 200 MG tablet TAKE 1 TABLET ONCE DAILY. 30 tablet 0  . carvedilol (COREG) 3.125 MG tablet TAKE 1 TABLET TWICE DAILY WITH A MEAL. 60 tablet 6  . cholecalciferol (VITAMIN D) 1000 units tablet Take 1,000 Units by mouth daily.    . clopidogrel (PLAVIX) 75 MG tablet TAKE 1 TABLET ONCE DAILY. 30 tablet 5  . Fluticasone-Salmeterol (AIRDUO RESPICLICK 782/42) 353-61 MCG/ACT AEPB Inhale 2 puffs into the lungs 2 (two) times daily. 1 each 0  . furosemide (LASIX) 20 MG tablet Take 1 tablet Monday, Wednesday and Friday 30 tablet 2  . guaiFENesin (MUCINEX) 600 MG 12 hr tablet Take 600-1,200 mg by mouth 2 (two) times daily. Takes two in the morning and one in the evening.    Marland Kitchen levothyroxine (SYNTHROID, LEVOTHROID) 25 MCG tablet TAKE 1 TABLET IN THE MORNING ON AN EMPTY STOMACH. 30 tablet 6  . magnesium oxide (MAG-OX) 400 (241.3 Mg) MG tablet Take 0.5 tablets (200 mg total) by mouth daily. 30 tablet 0  . Maltodextrin-Xanthan Gum (RESOURCE THICKENUP CLEAR) POWD Take 120 g by mouth as needed. 1 Can 0  . midodrine (PROAMATINE) 2.5 MG tablet Take 1 tablet (2.5 mg total) by mouth 3 (three) times daily with meals. 90 tablet 3  . Multiple Vitamin (MULTIVITAMIN) capsule Take 1 capsule by mouth daily.    . Multiple Vitamins-Minerals (PRESERVISION/LUTEIN) CAPS Take 1 capsule by mouth 2 (two) times daily.     Marland Kitchen oxybutynin (DITROPAN) 5 MG tablet TAKE 1 TABLET BY MOUTH TWICE DAILY. 60  tablet 0  . polyethylene glycol powder (GLYCOLAX/MIRALAX) powder DISSOLVE ONE CAPFUL IN 8 0Z. WATER TWICE DAILY. 527 g 3  . polyethylene glycol powder (GLYCOLAX/MIRALAX) powder DISSOLVE ONE CAPFUL IN 8 0Z. WATER TWICE DAILY. 527 g 0  . potassium chloride (K-DUR) 10 MEQ tablet Take 1 tablet Monday, Wednesday and Friday 30 tablet 3  . pravastatin (PRAVACHOL) 40 MG tablet TAKE 1 TABLET ONCE DAILY. 90 tablet 0  . Probiotic Product (ALIGN) 4 MG CAPS Take 1 capsule (4 mg total) by mouth daily. 20 capsule 0  . sertraline (ZOLOFT) 50 MG tablet TAKE ONE TABLET AT BEDTIME. 30 tablet 2  . thiamine 100 MG tablet Take 1 tablet (100 mg total) by mouth daily. 30 tablet 0  . traMADol (ULTRAM) 50 MG tablet Take 1 tablet (50 mg total) by mouth every 6 (six) hours as needed for severe pain. 30 tablet 0   No current facility-administered medications on file prior to visit.     Allergies  Allergen Reactions  . Lisinopril     BP dropped too low per daughter

## 2017-11-25 NOTE — Progress Notes (Signed)
Per SN verbally- to be sure this is not a yeast infection. Prescribed Diflucan 100mg  2 tabs first day followed by one daily until finished- take QHS. Brush teeth bid. Pt's daughter [name] has been made aware of recommendation. I contacted [facility] regarding letter tpo sign pt out to lunch. Kenney Houseman states that letter is not needed, as pt can be signed out if he is back at [facility] by 12am.  Rx has been faxed to [facility].  Nothing further is needed

## 2017-11-26 ENCOUNTER — Encounter: Payer: Medicare Other | Admitting: *Deleted

## 2017-11-26 ENCOUNTER — Telehealth: Payer: Self-pay | Admitting: Cardiology

## 2017-11-26 DIAGNOSIS — L0390 Cellulitis, unspecified: Secondary | ICD-10-CM | POA: Diagnosis not present

## 2017-11-26 DIAGNOSIS — L89100 Pressure ulcer of unspecified part of back, unstageable: Secondary | ICD-10-CM | POA: Diagnosis not present

## 2017-11-26 NOTE — Telephone Encounter (Signed)
LMOVM reminding pt to send remote transmission.   

## 2017-11-27 ENCOUNTER — Encounter: Payer: Self-pay | Admitting: Cardiology

## 2017-11-27 DIAGNOSIS — I951 Orthostatic hypotension: Secondary | ICD-10-CM | POA: Diagnosis not present

## 2017-11-27 DIAGNOSIS — Z9181 History of falling: Secondary | ICD-10-CM | POA: Diagnosis not present

## 2017-11-28 NOTE — Progress Notes (Signed)
No ICM remote transmission received for 11/26/2017 and next ICM transmission scheduled for 12/23/2017.

## 2017-11-28 NOTE — Telephone Encounter (Signed)
Spoke with pt about Dr Hochrein recommendation 

## 2017-12-04 ENCOUNTER — Encounter: Payer: Self-pay | Admitting: Pulmonary Disease

## 2017-12-04 DIAGNOSIS — L03012 Cellulitis of left finger: Secondary | ICD-10-CM | POA: Diagnosis not present

## 2017-12-04 DIAGNOSIS — I255 Ischemic cardiomyopathy: Secondary | ICD-10-CM | POA: Diagnosis not present

## 2017-12-04 DIAGNOSIS — R54 Age-related physical debility: Secondary | ICD-10-CM | POA: Diagnosis not present

## 2017-12-04 DIAGNOSIS — L89100 Pressure ulcer of unspecified part of back, unstageable: Secondary | ICD-10-CM | POA: Diagnosis not present

## 2017-12-04 NOTE — Telephone Encounter (Signed)
Please advise on email from [place], pt's daughter :  This isCindy.Dad has made great progress at [facility] and after a staff meeting yesterday, they say the issue keeping him from going home is the wound on his back.They've made an appointment for him to see a wound care physician in late Feb., but we expect him to go home sooner.We need to figure out the best course concerning his physical therapy needs after he is home.He can continue going to [facility] for outpatient rehab or could resume home health if he's not getting outpatient services, according to insurance stipulations. Should we also get an appointment with the outpatient rehab doctor (whose name I cannot remember)?If that's the advisable course, as we learned, it will take a long time to get an appointment.Would we need another referral from you or does the previous one stand? He will need something in the meantime or he will back slide.Following whatever near term therapy he resumes, should he still see the rehab physician?         Close

## 2017-12-04 NOTE — Telephone Encounter (Signed)
Per SN: 1-When he is d/c'ed home he will need PT and OT- can order through [facility] or [facility]- ? Whomever he has used in the past  2-He will need home visits for wound care  3-We placed referral before for [facility]- Dr. Naaman Plummer- need to check on this and make sure appt gets scheduled   I spoke with [name] and notified her of the above and she verbalized understanding  She states that if we go the home PT/OT route and have nurse do home visits for wound, his insurance will not cover the wound specialist that [facility] has set up for him end of Feb  She states that the way the insurance sees it, if he is able to go to an out pt appt, he should not need home health care and therefore it's one or the other  She wants to know which SN feels is more important  Please advise thanks

## 2017-12-04 NOTE — Telephone Encounter (Signed)
Per SN - Home care more important for now. Once he I strong enough he can send him to Dr Naaman Plummer.

## 2017-12-05 DIAGNOSIS — I951 Orthostatic hypotension: Secondary | ICD-10-CM | POA: Diagnosis not present

## 2017-12-05 DIAGNOSIS — Z9181 History of falling: Secondary | ICD-10-CM | POA: Diagnosis not present

## 2017-12-06 ENCOUNTER — Telehealth: Payer: Self-pay | Admitting: Pulmonary Disease

## 2017-12-06 NOTE — Telephone Encounter (Signed)
Called and spoke with patients daughter. She states that she will contact the home health company for them to let us know what we needed. Nothing further needed at this time.

## 2017-12-09 DIAGNOSIS — J449 Chronic obstructive pulmonary disease, unspecified: Secondary | ICD-10-CM | POA: Diagnosis not present

## 2017-12-09 DIAGNOSIS — L89100 Pressure ulcer of unspecified part of back, unstageable: Secondary | ICD-10-CM | POA: Diagnosis not present

## 2017-12-10 ENCOUNTER — Telehealth: Payer: Self-pay | Admitting: Pulmonary Disease

## 2017-12-10 DIAGNOSIS — Z7902 Long term (current) use of antithrombotics/antiplatelets: Secondary | ICD-10-CM | POA: Diagnosis not present

## 2017-12-10 DIAGNOSIS — I5043 Acute on chronic combined systolic (congestive) and diastolic (congestive) heart failure: Secondary | ICD-10-CM | POA: Diagnosis not present

## 2017-12-10 DIAGNOSIS — Z95810 Presence of automatic (implantable) cardiac defibrillator: Secondary | ICD-10-CM | POA: Diagnosis not present

## 2017-12-10 DIAGNOSIS — I255 Ischemic cardiomyopathy: Secondary | ICD-10-CM | POA: Diagnosis not present

## 2017-12-10 DIAGNOSIS — Z9981 Dependence on supplemental oxygen: Secondary | ICD-10-CM | POA: Diagnosis not present

## 2017-12-10 DIAGNOSIS — R1312 Dysphagia, oropharyngeal phase: Secondary | ICD-10-CM | POA: Diagnosis not present

## 2017-12-10 DIAGNOSIS — M1990 Unspecified osteoarthritis, unspecified site: Secondary | ICD-10-CM | POA: Diagnosis not present

## 2017-12-10 DIAGNOSIS — Z951 Presence of aortocoronary bypass graft: Secondary | ICD-10-CM | POA: Diagnosis not present

## 2017-12-10 DIAGNOSIS — I739 Peripheral vascular disease, unspecified: Secondary | ICD-10-CM | POA: Diagnosis not present

## 2017-12-10 DIAGNOSIS — E785 Hyperlipidemia, unspecified: Secondary | ICD-10-CM | POA: Diagnosis not present

## 2017-12-10 DIAGNOSIS — L89110 Pressure ulcer of right upper back, unstageable: Secondary | ICD-10-CM | POA: Diagnosis not present

## 2017-12-10 DIAGNOSIS — J449 Chronic obstructive pulmonary disease, unspecified: Secondary | ICD-10-CM | POA: Diagnosis not present

## 2017-12-10 DIAGNOSIS — K5790 Diverticulosis of intestine, part unspecified, without perforation or abscess without bleeding: Secondary | ICD-10-CM | POA: Diagnosis not present

## 2017-12-10 DIAGNOSIS — M810 Age-related osteoporosis without current pathological fracture: Secondary | ICD-10-CM | POA: Diagnosis not present

## 2017-12-10 DIAGNOSIS — Z87891 Personal history of nicotine dependence: Secondary | ICD-10-CM | POA: Diagnosis not present

## 2017-12-10 DIAGNOSIS — I110 Hypertensive heart disease with heart failure: Secondary | ICD-10-CM | POA: Diagnosis not present

## 2017-12-10 DIAGNOSIS — Z8673 Personal history of transient ischemic attack (TIA), and cerebral infarction without residual deficits: Secondary | ICD-10-CM | POA: Diagnosis not present

## 2017-12-10 DIAGNOSIS — I2510 Atherosclerotic heart disease of native coronary artery without angina pectoris: Secondary | ICD-10-CM | POA: Diagnosis not present

## 2017-12-10 DIAGNOSIS — Z7951 Long term (current) use of inhaled steroids: Secondary | ICD-10-CM | POA: Diagnosis not present

## 2017-12-10 NOTE — Telephone Encounter (Signed)
SN please advise, nurse from home health called and stated that patients BP was 75/47 on 2.18.19 and 120/70 on 2.19.19 patient is on carvelidol BID. Patient is asymptomatic.   Nurse is also requesting verbal orders for nurse visits two times a week for two weeks and once a week for one week. Also needing would care orders for a pressure sore on the patients back.   Patient was recently on 2 liters of o2 at night and she wants to make sure patient still needs to be on this. Please advise.

## 2017-12-11 DIAGNOSIS — I255 Ischemic cardiomyopathy: Secondary | ICD-10-CM | POA: Diagnosis not present

## 2017-12-11 DIAGNOSIS — I2510 Atherosclerotic heart disease of native coronary artery without angina pectoris: Secondary | ICD-10-CM | POA: Diagnosis not present

## 2017-12-11 DIAGNOSIS — I5043 Acute on chronic combined systolic (congestive) and diastolic (congestive) heart failure: Secondary | ICD-10-CM | POA: Diagnosis not present

## 2017-12-11 DIAGNOSIS — I110 Hypertensive heart disease with heart failure: Secondary | ICD-10-CM | POA: Diagnosis not present

## 2017-12-11 DIAGNOSIS — K5790 Diverticulosis of intestine, part unspecified, without perforation or abscess without bleeding: Secondary | ICD-10-CM | POA: Diagnosis not present

## 2017-12-11 DIAGNOSIS — L89110 Pressure ulcer of right upper back, unstageable: Secondary | ICD-10-CM | POA: Diagnosis not present

## 2017-12-12 ENCOUNTER — Telehealth: Payer: Self-pay

## 2017-12-12 ENCOUNTER — Telehealth: Payer: Self-pay | Admitting: Pulmonary Disease

## 2017-12-12 DIAGNOSIS — I5043 Acute on chronic combined systolic (congestive) and diastolic (congestive) heart failure: Secondary | ICD-10-CM | POA: Diagnosis not present

## 2017-12-12 DIAGNOSIS — I2510 Atherosclerotic heart disease of native coronary artery without angina pectoris: Secondary | ICD-10-CM | POA: Diagnosis not present

## 2017-12-12 DIAGNOSIS — K5790 Diverticulosis of intestine, part unspecified, without perforation or abscess without bleeding: Secondary | ICD-10-CM | POA: Diagnosis not present

## 2017-12-12 DIAGNOSIS — I255 Ischemic cardiomyopathy: Secondary | ICD-10-CM | POA: Diagnosis not present

## 2017-12-12 DIAGNOSIS — L89110 Pressure ulcer of right upper back, unstageable: Secondary | ICD-10-CM | POA: Diagnosis not present

## 2017-12-12 DIAGNOSIS — I110 Hypertensive heart disease with heart failure: Secondary | ICD-10-CM | POA: Diagnosis not present

## 2017-12-12 NOTE — Telephone Encounter (Signed)
Reviewed normal transmission with patients daughter [name] per [place]. I noted elevated thoracic impedance trends since January which she contributed to past hospitalization for pneumonia, denying any new ShOB or lower extremity edema. Daughter verbalized appreciation for call. Will continue to monitor remotely.

## 2017-12-12 NOTE — Telephone Encounter (Signed)
Patients daughter [name] called to report that the patient woke up at 4am with what he felt was a "thud". She reports that he states he has been shocked before and notes that this feeling is not the "eleictrical shock" that he felt when he was previously shocked by his ICD. I requested a remote transmission that [place] notes he is able to send. I will review the remote transmission upon its receipt and call back to discuss. [name] and patient are agreeable to this plan

## 2017-12-12 NOTE — Telephone Encounter (Signed)
Spoke with Patty with [facility]. Verbal order has been given. Nothing further was needed.

## 2017-12-13 DIAGNOSIS — I255 Ischemic cardiomyopathy: Secondary | ICD-10-CM | POA: Diagnosis not present

## 2017-12-13 DIAGNOSIS — I5043 Acute on chronic combined systolic (congestive) and diastolic (congestive) heart failure: Secondary | ICD-10-CM | POA: Diagnosis not present

## 2017-12-13 DIAGNOSIS — L89110 Pressure ulcer of right upper back, unstageable: Secondary | ICD-10-CM | POA: Diagnosis not present

## 2017-12-13 DIAGNOSIS — I2510 Atherosclerotic heart disease of native coronary artery without angina pectoris: Secondary | ICD-10-CM | POA: Diagnosis not present

## 2017-12-13 DIAGNOSIS — I110 Hypertensive heart disease with heart failure: Secondary | ICD-10-CM | POA: Diagnosis not present

## 2017-12-13 DIAGNOSIS — K5790 Diverticulosis of intestine, part unspecified, without perforation or abscess without bleeding: Secondary | ICD-10-CM | POA: Diagnosis not present

## 2017-12-13 NOTE — Telephone Encounter (Signed)
SN please advise. Thanks.  

## 2017-12-13 NOTE — Telephone Encounter (Signed)
Faxed signed wound care orders to [place] at [facility].  Called and spoke with Vincent about further orders per SN.  Rip Harbour could not take the orders, but said she would have a home health nurse call me back today 12/13/17.  Per SN ok  for home health nurse visit,wound care,and to instruct family. Continue O2 and continue same meds.

## 2017-12-16 ENCOUNTER — Telehealth: Payer: Self-pay | Admitting: Pulmonary Disease

## 2017-12-16 ENCOUNTER — Ambulatory Visit (INDEPENDENT_AMBULATORY_CARE_PROVIDER_SITE_OTHER): Payer: Medicare Other | Admitting: Pulmonary Disease

## 2017-12-16 ENCOUNTER — Ambulatory Visit (INDEPENDENT_AMBULATORY_CARE_PROVIDER_SITE_OTHER)
Admission: RE | Admit: 2017-12-16 | Discharge: 2017-12-16 | Disposition: A | Discharge status: Home / Self Care | Payer: Medicare Other | Source: Ambulatory Visit | Attending: Pulmonary Disease | Admitting: Pulmonary Disease

## 2017-12-16 ENCOUNTER — Other Ambulatory Visit (INDEPENDENT_AMBULATORY_CARE_PROVIDER_SITE_OTHER): Payer: Medicare Other

## 2017-12-16 ENCOUNTER — Encounter: Payer: Self-pay | Admitting: Pulmonary Disease

## 2017-12-16 VITALS — BP 110/76 | HR 77 | Temp 97.6°F | Ht 71.0 in | Wt 159.6 lb

## 2017-12-16 DIAGNOSIS — W19XXXD Unspecified fall, subsequent encounter: Secondary | ICD-10-CM

## 2017-12-16 DIAGNOSIS — I255 Ischemic cardiomyopathy: Secondary | ICD-10-CM | POA: Diagnosis not present

## 2017-12-16 DIAGNOSIS — J449 Chronic obstructive pulmonary disease, unspecified: Secondary | ICD-10-CM | POA: Diagnosis not present

## 2017-12-16 DIAGNOSIS — J90 Pleural effusion, not elsewhere classified: Secondary | ICD-10-CM

## 2017-12-16 DIAGNOSIS — I2510 Atherosclerotic heart disease of native coronary artery without angina pectoris: Secondary | ICD-10-CM | POA: Diagnosis not present

## 2017-12-16 DIAGNOSIS — Z9861 Coronary angioplasty status: Secondary | ICD-10-CM | POA: Diagnosis not present

## 2017-12-16 DIAGNOSIS — J181 Lobar pneumonia, unspecified organism: Secondary | ICD-10-CM

## 2017-12-16 DIAGNOSIS — K5790 Diverticulosis of intestine, part unspecified, without perforation or abscess without bleeding: Secondary | ICD-10-CM | POA: Diagnosis not present

## 2017-12-16 DIAGNOSIS — I272 Pulmonary hypertension, unspecified: Secondary | ICD-10-CM | POA: Diagnosis not present

## 2017-12-16 DIAGNOSIS — M150 Primary generalized (osteo)arthritis: Secondary | ICD-10-CM | POA: Diagnosis not present

## 2017-12-16 DIAGNOSIS — Z95810 Presence of automatic (implantable) cardiac defibrillator: Secondary | ICD-10-CM | POA: Diagnosis not present

## 2017-12-16 DIAGNOSIS — I5042 Chronic combined systolic (congestive) and diastolic (congestive) heart failure: Secondary | ICD-10-CM

## 2017-12-16 DIAGNOSIS — M159 Polyosteoarthritis, unspecified: Secondary | ICD-10-CM

## 2017-12-16 DIAGNOSIS — J189 Pneumonia, unspecified organism: Secondary | ICD-10-CM

## 2017-12-16 DIAGNOSIS — L89110 Pressure ulcer of right upper back, unstageable: Secondary | ICD-10-CM | POA: Diagnosis not present

## 2017-12-16 DIAGNOSIS — R5381 Other malaise: Secondary | ICD-10-CM

## 2017-12-16 DIAGNOSIS — I5043 Acute on chronic combined systolic (congestive) and diastolic (congestive) heart failure: Secondary | ICD-10-CM | POA: Diagnosis not present

## 2017-12-16 DIAGNOSIS — I110 Hypertensive heart disease with heart failure: Secondary | ICD-10-CM | POA: Diagnosis not present

## 2017-12-16 LAB — COMPREHENSIVE METABOLIC PANEL
ALBUMIN: 3.5 g/dL (ref 3.5–5.2)
ALK PHOS: 98 U/L (ref 39–117)
ALT: 16 U/L (ref 0–53)
AST: 25 U/L (ref 0–37)
BUN: 17 mg/dL (ref 6–23)
CALCIUM: 9.5 mg/dL (ref 8.4–10.5)
CHLORIDE: 101 meq/L (ref 96–112)
CO2: 30 mEq/L (ref 19–32)
CREATININE: 0.9 mg/dL (ref 0.40–1.50)
GFR: 85.28 mL/min (ref >60.00)
Glucose, Bld: 71 mg/dL (ref 70–99)
Potassium: 4.4 mEq/L (ref 3.5–5.1)
Sodium: 138 mEq/L (ref 135–145)
TOTAL PROTEIN: 7 g/dL (ref 6.0–8.3)
Total Bilirubin: 0.5 mg/dL (ref 0.2–1.2)

## 2017-12-16 LAB — CBC WITH DIFFERENTIAL/PLATELET
BASOS PCT: 0.8 % (ref 0.0–3.0)
Basophils Absolute: 0.1 10*3/uL (ref 0.0–0.1)
Eosinophils Absolute: 1.8 10*3/uL — ABNORMAL HIGH (ref 0.0–0.7)
Eosinophils Relative: 19.1 % — ABNORMAL HIGH (ref 0.0–5.0)
HEMATOCRIT: 40.1 % (ref 39.0–52.0)
HEMOGLOBIN: 13.7 g/dL (ref 13.0–17.0)
LYMPHS PCT: 25.4 % (ref 12.0–46.0)
Lymphs Abs: 2.4 10*3/uL (ref 0.7–4.0)
MCHC: 34.2 g/dL (ref 30.0–36.0)
MCV: 95.6 fl (ref 78.0–100.0)
MONOS PCT: 7.1 % (ref 3.0–12.0)
Monocytes Absolute: 0.7 10*3/uL (ref 0.1–1.0)
NEUTROS ABS: 4.5 10*3/uL (ref 1.4–7.7)
Neutrophils Relative %: 47.6 % (ref 43.0–77.0)
PLATELETS: 133 10*3/uL — AB (ref 150.0–400.0)
RBC: 4.2 Mil/uL — ABNORMAL LOW (ref 4.22–5.81)
RDW: 16.2 % — AB (ref 11.5–15.5)
WBC: 9.5 10*3/uL (ref 4.0–10.5)

## 2017-12-16 LAB — BRAIN NATRIURETIC PEPTIDE: PRO B NATRI PEPTIDE: 673 pg/mL — AB (ref 0.0–100.0)

## 2017-12-16 LAB — TSH: TSH: 7.31 u[IU]/mL — ABNORMAL HIGH (ref 0.35–4.50)

## 2017-12-16 LAB — SEDIMENTATION RATE: Sed Rate: 49 mm/hr — ABNORMAL HIGH (ref 0–20)

## 2017-12-16 NOTE — Telephone Encounter (Signed)
Sn please advise, patient was just released to go home and is now needing physical therapy in the home. [facility] is asking for verbals orders to be able to provide this. Thanks.

## 2017-12-16 NOTE — Patient Instructions (Signed)
Today we updated your med list in our EPIC system...    Continue your current medications the same...  Today we did your follow up CXR & blood work...    We will contact you w/ the results when available...   Keep up the good work w/ the home therapy...  You have an appt w/ DrHochrein on 01/10/18...  Let's change our prev scheduled 3/22 visit til the 3rd week of April   Call for any questions or if we can be of assistance in any way.Marland KitchenMarland Kitchen

## 2017-12-16 NOTE — Telephone Encounter (Signed)
Given to SN to advise.

## 2017-12-16 NOTE — Telephone Encounter (Signed)
SN ok for OT once a week for 4 weeks?  Patient Instructions by Noralee Space, MD at 12/16/2017 11:00 AM   Author: Noralee Space, MD Author Type: Physician Filed: 12/16/2017 12:11 PM  Note Status: Signed Cosign: Cosign Not Required Encounter Date: 12/16/2017  Editor: Noralee Space, MD (Physician)    Today we updated your med list in our EPIC system...    Continue your current medications the same...  Today we did your follow up CXR & blood work...    We will contact you w/ the results when available...   Keep up the good work w/ the home therapy...  You have an appt w/ DrHochrein on 01/10/18...  Let's change our prev scheduled 3/22 visit til the 3rd week of April   Call for any questions or if we can be of assistance in any way...    Instructions      Return in about 2 months (around 02/13/2018).  Patient Instructions

## 2017-12-16 NOTE — Progress Notes (Signed)
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 not believe that he has a problem in this area, declines PPI Rx or GI eval...  LBP w/ compression fx T12 in 2009 w/ vertebroplasty>>  FALL 4/12 w/ signif trauma & right rib fxs>   OSTEOPOROSIS>  On calcium, MVI, VitD 2000u/d;  F/u BMD -3.2 in R FemNeck & Alendronate70/wk started 8/15 but pt didn't stick w/ this med & stopped  on his own... New part compression T10 found 8/15 on routine CXR & L2/ L3 on CT Lumbar spine 10/18> he declined to restart Alendronate or consider alternative therapy... Subseq part compression L2 & mod compression L3 evaluated by Shara Blazing 08/2017 => pain rx, PT, refer to Rheum for more aggressive management of osteoporosis...  Hx SHINGLES superimposed on his Dermatitis>> presented 05/2017 w/ left C2 distrib shingles... 05/27/17>   Maxum has a signif shingles outbreak over the left C2 distrib  although I cannot r/o some Trigem nerve overlap here;  He needs to continue the Keflex Bid & ValtrexTid til gone, and we will start a Pred taper for the amt of imflamm present;  His skin/ scalp needs attention from his severe seb derm + the njew shingles outbreak> I suggest that he get in the shower/ tub Bid & gently wash/ soak the area on his neck & scalp, then gently use a washcloth to remove dead skin & clean the area; pat dry w/ clean towels; then he can apply the salve provided by Derm (topical lidocaine gel) if needed for pain... He needs to have this checked again in about 1wk by myself or Derm & have his Seb Dermatitis addressed more vigorously + prob excision of the left leg lesion 06/18/17>   Darshan has a combination of seb dermatitis on scalp/ face w/ flaking etc, and shingles/ post herpetic neuralgia involving mainly left C2 distrib;  We reviewed Tramadol/ Tylenol for pain + try Lotrosone cream for the seb derm- he really needs to work on tis to clean it up    Patient's Medications  New Prescriptions   No medications on file  Previous Medications   ALBUTEROL (PROVENTIL HFA;VENTOLIN HFA) 108 (90 BASE) MCG/ACT INHALER    Inhale 2 puffs into the lungs every 6 (six) hours as needed for wheezing or shortness of breath.   ALENDRONATE (FOSAMAX) 70 MG TABLET    Take 1 tablet (70 mg total) by mouth once a week. Take with a full glass of water on an empty stomach.   ALLOPURINOL (ZYLOPRIM) 100 MG TABLET    TAKE 1 TABLET ONCE DAILY.   AMIODARONE (PACERONE) 200 MG TABLET    TAKE 1 TABLET ONCE DAILY.   CARVEDILOL (COREG) 3.125 MG TABLET    TAKE 1 TABLET TWICE DAILY WITH A MEAL.   CHOLECALCIFEROL (VITAMIN D) 1000 UNITS TABLET    Take 1,000 Units by mouth daily.   CLOPIDOGREL (PLAVIX) 75 MG TABLET    TAKE 1 TABLET ONCE DAILY.   FLUCONAZOLE (DIFLUCAN) 100 MG TABLET    2 tablets first day followed by one daily until finished. Take QHS   FLUTICASONE-SALMETEROL (AIRDUO RESPICLICK 277/82) 423-53 MCG/ACT AEPB     Inhale 2 puffs into the lungs 2 (two) times daily.   FUROSEMIDE (LASIX) 20 MG TABLET    Take 1 tablet Monday, Wednesday and Friday   GUAIFENESIN (MUCINEX) 600 MG 12 HR TABLET    Take 600-1,200 mg by mouth 2 (two) times daily. Takes two in the morning and one in the evening.   LEVOTHYROXINE (SYNTHROID, LEVOTHROID) 25 MCG TABLET    TAKE 1 TABLET IN THE MORNING ON AN EMPTY STOMACH.   MAGNESIUM OXIDE (MAG-OX) 400 (241.3 MG) MG TABLET    Take 0.5 tablets (200 mg total) by mouth daily.   MALTODEXTRIN-XANTHAN GUM (RESOURCE THICKENUP CLEAR) POWD    Take 120 g by mouth as needed.   MIDODRINE (PROAMATINE) 2.5 MG TABLET    Take 1 tablet (2.5 mg  total) by mouth 3 (three) times daily with meals.   MULTIPLE VITAMIN (MULTIVITAMIN) CAPSULE    Take 1 capsule by mouth daily.   MULTIPLE VITAMINS-MINERALS (PRESERVISION/LUTEIN) CAPS    Take 1 capsule by mouth 2 (two) times daily.    OXYBUTYNIN (DITROPAN) 5 MG TABLET    TAKE 1 TABLET BY MOUTH TWICE DAILY.   POLYETHYLENE GLYCOL POWDER (GLYCOLAX/MIRALAX) POWDER    DISSOLVE ONE CAPFUL IN 8 0Z. WATER TWICE DAILY.   POTASSIUM CHLORIDE (K-DUR) 10 MEQ TABLET    Take 1 tablet Monday, Wednesday and Friday   PRAVASTATIN (PRAVACHOL) 40 MG TABLET    TAKE 1 TABLET ONCE DAILY.   PROBIOTIC PRODUCT (ALIGN) 4 MG CAPS    Take 1 capsule (4 mg total) by mouth daily.   SERTRALINE (ZOLOFT) 50 MG TABLET    TAKE ONE TABLET AT BEDTIME.   THIAMINE 100 MG TABLET    Take 1 tablet (100 mg total) by mouth daily.   TRAMADOL (ULTRAM) 50 MG TABLET    Take 1 tablet (50 mg total) by mouth every 6 (six) hours as needed for severe pain.  Modified Medications   No medications on file  Discontinued Medications   POLYETHYLENE GLYCOL POWDER (GLYCOLAX/MIRALAX) POWDER    DISSOLVE ONE CAPFUL IN 8 0Z. WATER TWICE DAILY.

## 2017-12-17 DIAGNOSIS — I5043 Acute on chronic combined systolic (congestive) and diastolic (congestive) heart failure: Secondary | ICD-10-CM | POA: Diagnosis not present

## 2017-12-17 DIAGNOSIS — I110 Hypertensive heart disease with heart failure: Secondary | ICD-10-CM | POA: Diagnosis not present

## 2017-12-17 DIAGNOSIS — K5790 Diverticulosis of intestine, part unspecified, without perforation or abscess without bleeding: Secondary | ICD-10-CM | POA: Diagnosis not present

## 2017-12-17 DIAGNOSIS — L89110 Pressure ulcer of right upper back, unstageable: Secondary | ICD-10-CM | POA: Diagnosis not present

## 2017-12-17 DIAGNOSIS — I2510 Atherosclerotic heart disease of native coronary artery without angina pectoris: Secondary | ICD-10-CM | POA: Diagnosis not present

## 2017-12-17 DIAGNOSIS — I255 Ischemic cardiomyopathy: Secondary | ICD-10-CM | POA: Diagnosis not present

## 2017-12-17 NOTE — Telephone Encounter (Signed)
There is a message on this already and routed message to SN. Still awaiting his response. Will close message since this is a duplicate.

## 2017-12-17 NOTE — Telephone Encounter (Signed)
Left message with  Barnetta Chapel, OT with [facility], to call back to give OT orders per SN.

## 2017-12-17 NOTE — Telephone Encounter (Signed)
Per SN ok for OT once a week for 4 weeks.

## 2017-12-17 NOTE — Telephone Encounter (Signed)
Spoke with Barnetta Chapel OT for [facility]. Gave her orders per SN for 4 weeks of OT.

## 2017-12-18 ENCOUNTER — Encounter (HOSPITAL_BASED_OUTPATIENT_CLINIC_OR_DEPARTMENT_OTHER): Payer: Medicare Other | Attending: Physician Assistant

## 2017-12-18 DIAGNOSIS — J449 Chronic obstructive pulmonary disease, unspecified: Secondary | ICD-10-CM | POA: Insufficient documentation

## 2017-12-18 DIAGNOSIS — Z87891 Personal history of nicotine dependence: Secondary | ICD-10-CM | POA: Diagnosis not present

## 2017-12-18 DIAGNOSIS — Z955 Presence of coronary angioplasty implant and graft: Secondary | ICD-10-CM | POA: Insufficient documentation

## 2017-12-18 DIAGNOSIS — I2510 Atherosclerotic heart disease of native coronary artery without angina pectoris: Secondary | ICD-10-CM | POA: Diagnosis not present

## 2017-12-18 DIAGNOSIS — L89100 Pressure ulcer of unspecified part of back, unstageable: Secondary | ICD-10-CM | POA: Insufficient documentation

## 2017-12-18 DIAGNOSIS — I739 Peripheral vascular disease, unspecified: Secondary | ICD-10-CM | POA: Diagnosis not present

## 2017-12-18 DIAGNOSIS — I252 Old myocardial infarction: Secondary | ICD-10-CM | POA: Diagnosis not present

## 2017-12-18 DIAGNOSIS — I10 Essential (primary) hypertension: Secondary | ICD-10-CM | POA: Diagnosis not present

## 2017-12-18 NOTE — Telephone Encounter (Signed)
Spoke with Penni Bombard From [facility] gave verbal order per Lisa's message that SN is okay with the services being provided. Nothing further needed

## 2017-12-19 DIAGNOSIS — L89110 Pressure ulcer of right upper back, unstageable: Secondary | ICD-10-CM | POA: Diagnosis not present

## 2017-12-19 DIAGNOSIS — K5790 Diverticulosis of intestine, part unspecified, without perforation or abscess without bleeding: Secondary | ICD-10-CM | POA: Diagnosis not present

## 2017-12-19 DIAGNOSIS — I110 Hypertensive heart disease with heart failure: Secondary | ICD-10-CM | POA: Diagnosis not present

## 2017-12-19 DIAGNOSIS — I5043 Acute on chronic combined systolic (congestive) and diastolic (congestive) heart failure: Secondary | ICD-10-CM | POA: Diagnosis not present

## 2017-12-19 DIAGNOSIS — I2510 Atherosclerotic heart disease of native coronary artery without angina pectoris: Secondary | ICD-10-CM | POA: Diagnosis not present

## 2017-12-19 DIAGNOSIS — I255 Ischemic cardiomyopathy: Secondary | ICD-10-CM | POA: Diagnosis not present

## 2017-12-20 DIAGNOSIS — I110 Hypertensive heart disease with heart failure: Secondary | ICD-10-CM | POA: Diagnosis not present

## 2017-12-20 DIAGNOSIS — L89110 Pressure ulcer of right upper back, unstageable: Secondary | ICD-10-CM | POA: Diagnosis not present

## 2017-12-20 DIAGNOSIS — I5043 Acute on chronic combined systolic (congestive) and diastolic (congestive) heart failure: Secondary | ICD-10-CM | POA: Diagnosis not present

## 2017-12-20 DIAGNOSIS — I255 Ischemic cardiomyopathy: Secondary | ICD-10-CM | POA: Diagnosis not present

## 2017-12-20 DIAGNOSIS — I2510 Atherosclerotic heart disease of native coronary artery without angina pectoris: Secondary | ICD-10-CM | POA: Diagnosis not present

## 2017-12-20 DIAGNOSIS — K5790 Diverticulosis of intestine, part unspecified, without perforation or abscess without bleeding: Secondary | ICD-10-CM | POA: Diagnosis not present

## 2017-12-23 ENCOUNTER — Ambulatory Visit (INDEPENDENT_AMBULATORY_CARE_PROVIDER_SITE_OTHER): Payer: Medicare Other

## 2017-12-23 DIAGNOSIS — Z95810 Presence of automatic (implantable) cardiac defibrillator: Secondary | ICD-10-CM

## 2017-12-23 DIAGNOSIS — I2510 Atherosclerotic heart disease of native coronary artery without angina pectoris: Secondary | ICD-10-CM | POA: Diagnosis not present

## 2017-12-23 DIAGNOSIS — I5043 Acute on chronic combined systolic (congestive) and diastolic (congestive) heart failure: Secondary | ICD-10-CM | POA: Diagnosis not present

## 2017-12-23 DIAGNOSIS — I5042 Chronic combined systolic (congestive) and diastolic (congestive) heart failure: Secondary | ICD-10-CM

## 2017-12-23 DIAGNOSIS — I255 Ischemic cardiomyopathy: Secondary | ICD-10-CM | POA: Diagnosis not present

## 2017-12-23 DIAGNOSIS — I110 Hypertensive heart disease with heart failure: Secondary | ICD-10-CM | POA: Diagnosis not present

## 2017-12-23 DIAGNOSIS — L89110 Pressure ulcer of right upper back, unstageable: Secondary | ICD-10-CM | POA: Diagnosis not present

## 2017-12-23 DIAGNOSIS — K5790 Diverticulosis of intestine, part unspecified, without perforation or abscess without bleeding: Secondary | ICD-10-CM | POA: Diagnosis not present

## 2017-12-23 NOTE — Progress Notes (Signed)
EPIC Encounter for ICM Monitoring  Patient Name: [name] is a 84 y.o. male Date: 12/23/2017 Primary Care Physican: Noralee Space, MD Primary Cardiologist: Hochrein Electrophysiologist: Faustino Congress Weight:unknown                                                           Spoke with [name].  She stated patient was in rehab at SNF x 3 weeks and rencently discharged but he continues to be weakness. He does not follow low salt diet.  She does not think he is having any fluid symptoms at this time.  Per 11/14/17 office note from [facility], [place], Furosemide was stopped early January due to patient's significantly orthostatic hypotension and he readded it 3 days a week at that visit.      Thoracic impedance abnormal suggesting fluid accumulation since 10/30/2017.    Prescribed dosage: Furosemide20 mg 1 tabletMonday, Wednesday and Friday.Potassium 20 mEq 1 tablet every Monday, Wednesday and Friday.  Labs: 12/16/2017 Creatinine 0.90, BUN 17, Potassium 4.4, Sodium 138 11/13/2017 Creatinine 0.92, BUN 16, Potassium 4.3, Sodium 136, EGFR 76-88 10/30/2017 Creatinine 1.06, BUN 14, Potassium 4.6, Sodium 134, EGFR 64-74 10/17/2017 Creatinine0.94, BUN11, Potassium3.6, Sodium134, EGFR>60 10/16/2017 Creatinine0.87, BUN9,   Potassium3.6, Sodium135, EGFR>60 10/15/2017 Creatinine0.89, BUN9,   Potassium3.8, Sodium132, EGFR>60 12/24/2018Creatinine 0.85, BUN10, Potassium3.9, Sodium133, EGFR>60  12/23/2018Creatinine 0.87, BUN13, Potassium3.6, Sodium135, EGFR>60 12/22/2018Creatinine 0.91, BUN15, Potassium2.8, Sodium133, EGFR>60  12/21/2018Creatinine 0.95, BUN15, Potassium3.2, Sodium134, EGFR>60  12/20/2018Creatinine 1.03, BUN15, Potassium3.6, Sodium135, EGFR>60  A complete set of results can be found in Results Review.  Recommendations: Advised would send to Dr Percival Spanish for review and if any recommendations will call her back.    Follow-up plan: ICM  clinic phone appointment on 01/02/2018 to recheck fluid levels.  Office appointment scheduled 01/10/2018 with Dr. Percival Spanish.  Copy of ICM check sent to Dr. Warren Lacy and Dr. Caryl Comes.   3 month ICM trend: 12/23/2017    1 Year ICM trend:       [name], RN 12/23/2017 9:29 AM

## 2017-12-24 DIAGNOSIS — K5790 Diverticulosis of intestine, part unspecified, without perforation or abscess without bleeding: Secondary | ICD-10-CM | POA: Diagnosis not present

## 2017-12-24 DIAGNOSIS — I255 Ischemic cardiomyopathy: Secondary | ICD-10-CM | POA: Diagnosis not present

## 2017-12-24 DIAGNOSIS — I110 Hypertensive heart disease with heart failure: Secondary | ICD-10-CM | POA: Diagnosis not present

## 2017-12-24 DIAGNOSIS — I5043 Acute on chronic combined systolic (congestive) and diastolic (congestive) heart failure: Secondary | ICD-10-CM | POA: Diagnosis not present

## 2017-12-24 DIAGNOSIS — I2510 Atherosclerotic heart disease of native coronary artery without angina pectoris: Secondary | ICD-10-CM | POA: Diagnosis not present

## 2017-12-24 DIAGNOSIS — L89110 Pressure ulcer of right upper back, unstageable: Secondary | ICD-10-CM | POA: Diagnosis not present

## 2017-12-25 ENCOUNTER — Encounter (HOSPITAL_BASED_OUTPATIENT_CLINIC_OR_DEPARTMENT_OTHER): Payer: Medicare Other | Attending: Physician Assistant

## 2017-12-25 DIAGNOSIS — Z87891 Personal history of nicotine dependence: Secondary | ICD-10-CM | POA: Diagnosis not present

## 2017-12-25 DIAGNOSIS — I252 Old myocardial infarction: Secondary | ICD-10-CM | POA: Insufficient documentation

## 2017-12-25 DIAGNOSIS — L89100 Pressure ulcer of unspecified part of back, unstageable: Secondary | ICD-10-CM | POA: Diagnosis not present

## 2017-12-25 DIAGNOSIS — I739 Peripheral vascular disease, unspecified: Secondary | ICD-10-CM | POA: Insufficient documentation

## 2017-12-25 DIAGNOSIS — L89103 Pressure ulcer of unspecified part of back, stage 3: Secondary | ICD-10-CM | POA: Insufficient documentation

## 2017-12-25 DIAGNOSIS — J449 Chronic obstructive pulmonary disease, unspecified: Secondary | ICD-10-CM | POA: Insufficient documentation

## 2017-12-25 DIAGNOSIS — G629 Polyneuropathy, unspecified: Secondary | ICD-10-CM | POA: Insufficient documentation

## 2017-12-25 DIAGNOSIS — I110 Hypertensive heart disease with heart failure: Secondary | ICD-10-CM | POA: Diagnosis not present

## 2017-12-25 DIAGNOSIS — L89104 Pressure ulcer of unspecified part of back, stage 4: Secondary | ICD-10-CM | POA: Diagnosis not present

## 2017-12-25 DIAGNOSIS — I2510 Atherosclerotic heart disease of native coronary artery without angina pectoris: Secondary | ICD-10-CM | POA: Insufficient documentation

## 2017-12-25 DIAGNOSIS — I509 Heart failure, unspecified: Secondary | ICD-10-CM | POA: Insufficient documentation

## 2017-12-26 DIAGNOSIS — L89110 Pressure ulcer of right upper back, unstageable: Secondary | ICD-10-CM | POA: Diagnosis not present

## 2017-12-26 DIAGNOSIS — K5790 Diverticulosis of intestine, part unspecified, without perforation or abscess without bleeding: Secondary | ICD-10-CM | POA: Diagnosis not present

## 2017-12-26 DIAGNOSIS — I110 Hypertensive heart disease with heart failure: Secondary | ICD-10-CM | POA: Diagnosis not present

## 2017-12-26 DIAGNOSIS — I5043 Acute on chronic combined systolic (congestive) and diastolic (congestive) heart failure: Secondary | ICD-10-CM | POA: Diagnosis not present

## 2017-12-26 DIAGNOSIS — I2510 Atherosclerotic heart disease of native coronary artery without angina pectoris: Secondary | ICD-10-CM | POA: Diagnosis not present

## 2017-12-26 DIAGNOSIS — I255 Ischemic cardiomyopathy: Secondary | ICD-10-CM | POA: Diagnosis not present

## 2017-12-28 ENCOUNTER — Other Ambulatory Visit: Payer: Self-pay | Admitting: Pulmonary Disease

## 2017-12-30 ENCOUNTER — Telehealth: Payer: Self-pay | Admitting: Pulmonary Disease

## 2017-12-30 ENCOUNTER — Telehealth: Payer: Self-pay | Admitting: Cardiology

## 2017-12-30 DIAGNOSIS — L89110 Pressure ulcer of right upper back, unstageable: Secondary | ICD-10-CM | POA: Diagnosis not present

## 2017-12-30 DIAGNOSIS — K5790 Diverticulosis of intestine, part unspecified, without perforation or abscess without bleeding: Secondary | ICD-10-CM | POA: Diagnosis not present

## 2017-12-30 DIAGNOSIS — I2510 Atherosclerotic heart disease of native coronary artery without angina pectoris: Secondary | ICD-10-CM | POA: Diagnosis not present

## 2017-12-30 DIAGNOSIS — I5043 Acute on chronic combined systolic (congestive) and diastolic (congestive) heart failure: Secondary | ICD-10-CM | POA: Diagnosis not present

## 2017-12-30 DIAGNOSIS — I110 Hypertensive heart disease with heart failure: Secondary | ICD-10-CM | POA: Diagnosis not present

## 2017-12-30 DIAGNOSIS — I255 Ischemic cardiomyopathy: Secondary | ICD-10-CM | POA: Diagnosis not present

## 2017-12-30 NOTE — Telephone Encounter (Signed)
Closed Encounter  °

## 2017-12-30 NOTE — Telephone Encounter (Signed)
Called and spoke with Sharyn Lull she states that patient fell again on Friday 3.8.19. Patient decided to go to the bathroom without his walker. That is when the patient fell. Patient has a few skin tears. Patient is complaining of his left ribs hurting. Patient denied going to the ER. Sharyn Lull states that the patients home health nurse is still coming out and patient has seemed fine. Sharyn Lull wanted to send this to [facility] as a FYI.

## 2017-12-31 DIAGNOSIS — I5043 Acute on chronic combined systolic (congestive) and diastolic (congestive) heart failure: Secondary | ICD-10-CM | POA: Diagnosis not present

## 2017-12-31 DIAGNOSIS — I110 Hypertensive heart disease with heart failure: Secondary | ICD-10-CM | POA: Diagnosis not present

## 2017-12-31 DIAGNOSIS — I255 Ischemic cardiomyopathy: Secondary | ICD-10-CM | POA: Diagnosis not present

## 2017-12-31 DIAGNOSIS — K5790 Diverticulosis of intestine, part unspecified, without perforation or abscess without bleeding: Secondary | ICD-10-CM | POA: Diagnosis not present

## 2017-12-31 DIAGNOSIS — L89110 Pressure ulcer of right upper back, unstageable: Secondary | ICD-10-CM | POA: Diagnosis not present

## 2017-12-31 DIAGNOSIS — I2510 Atherosclerotic heart disease of native coronary artery without angina pectoris: Secondary | ICD-10-CM | POA: Diagnosis not present

## 2018-01-01 ENCOUNTER — Telehealth: Payer: Self-pay | Admitting: Pulmonary Disease

## 2018-01-01 DIAGNOSIS — L89103 Pressure ulcer of unspecified part of back, stage 3: Secondary | ICD-10-CM | POA: Diagnosis not present

## 2018-01-01 DIAGNOSIS — J449 Chronic obstructive pulmonary disease, unspecified: Secondary | ICD-10-CM | POA: Diagnosis not present

## 2018-01-01 DIAGNOSIS — I110 Hypertensive heart disease with heart failure: Secondary | ICD-10-CM | POA: Diagnosis not present

## 2018-01-01 DIAGNOSIS — L89104 Pressure ulcer of unspecified part of back, stage 4: Secondary | ICD-10-CM | POA: Diagnosis not present

## 2018-01-01 DIAGNOSIS — I2510 Atherosclerotic heart disease of native coronary artery without angina pectoris: Secondary | ICD-10-CM | POA: Diagnosis not present

## 2018-01-01 DIAGNOSIS — I739 Peripheral vascular disease, unspecified: Secondary | ICD-10-CM | POA: Diagnosis not present

## 2018-01-01 NOTE — Telephone Encounter (Signed)
Left message to call back on [facility] home number, Cindy's numbers, and Connie's numbers to call back.  I also left a message for [facility], Sharyn Lull to call back.  Per SN-  1) Apply heating pad for rib pain 2) He has Tramadol and Tylenol for every 6 hours as needed for pain.

## 2018-01-01 NOTE — Telephone Encounter (Signed)
Elton Sin, LPN      84:21 PM  Note    Left message to call back on [facility] home number, Cindy's numbers, and Connie's numbers to call back.  I also left a message for [facility], Sharyn Lull to call back.  Per SN-  1) Apply heating pad for rib pain 2) He has Tramadol and Tylenol for every 6 hours as needed for pain.     Left message for [name] to call back.

## 2018-01-02 ENCOUNTER — Ambulatory Visit (INDEPENDENT_AMBULATORY_CARE_PROVIDER_SITE_OTHER): Payer: Self-pay

## 2018-01-02 DIAGNOSIS — I255 Ischemic cardiomyopathy: Secondary | ICD-10-CM | POA: Diagnosis not present

## 2018-01-02 DIAGNOSIS — I5043 Acute on chronic combined systolic (congestive) and diastolic (congestive) heart failure: Secondary | ICD-10-CM | POA: Diagnosis not present

## 2018-01-02 DIAGNOSIS — I110 Hypertensive heart disease with heart failure: Secondary | ICD-10-CM | POA: Diagnosis not present

## 2018-01-02 DIAGNOSIS — Z95810 Presence of automatic (implantable) cardiac defibrillator: Secondary | ICD-10-CM

## 2018-01-02 DIAGNOSIS — L89110 Pressure ulcer of right upper back, unstageable: Secondary | ICD-10-CM | POA: Diagnosis not present

## 2018-01-02 DIAGNOSIS — I5042 Chronic combined systolic (congestive) and diastolic (congestive) heart failure: Secondary | ICD-10-CM

## 2018-01-02 DIAGNOSIS — K5790 Diverticulosis of intestine, part unspecified, without perforation or abscess without bleeding: Secondary | ICD-10-CM | POA: Diagnosis not present

## 2018-01-02 DIAGNOSIS — I2510 Atherosclerotic heart disease of native coronary artery without angina pectoris: Secondary | ICD-10-CM | POA: Diagnosis not present

## 2018-01-02 NOTE — Telephone Encounter (Signed)
Called and spoke with [name] about recent fall.  Patient stated the he is still feeling sore around his ribs on his left side, but it has improved.  He states that he has a bruise around his eye, but it is much better.  I explained the recommendations by SN, apply heating pad, take Tramadol and Tylenol every 6 hours as needed for pain.  Patient stated that he has a heating pad, but he doesn't feel that he needs it at this time.  He stated that he understands the recommendations.  He stated the he didn't feel like he needed anything at this time, but would call if things change. Nothing further needed at this time.

## 2018-01-02 NOTE — Progress Notes (Signed)
EPIC Encounter for ICM Monitoring  Patient Name: [name] is a 84 y.o. male Date: 01/02/2018 Primary Care Physican: Noralee Space, MD Primary Cardiologist: Hochrein Electrophysiologist: Faustino Congress Weight:unknown  Spoke with [name].  Heart Failure questions reviewed, pt asymptomatic for fluid symptoms.  He does not report any dizziness or lightheadedness even though he continues to have low BP.  He does not ususally take the morning dose of Carvedilol as instructed when BP is <100.        Thoracic impedance abnormal suggesting fluid accumulation since 10/31/2017.  Prescribed dosage: Furosemide20 mg 1 tabletMonday, Wednesday and Friday.Potassium 20 mEq 1 tablet every Monday, Wednesday and Friday. Per 11/14/17 office note from [facility], [place], Furosemide was stopped early January due to patient's significantly orthostatic hypotension and was restarted for 3 days a week..   Labs: 12/16/2017 Creatinine 0.90, BUN 17, Potassium 4.4, Sodium 138 11/13/2017 Creatinine 0.92, BUN 16, Potassium 4.3, Sodium 136, EGFR 76-88 10/30/2017 Creatinine 1.06, BUN 14, Potassium 4.6, Sodium 134, EGFR 64-74 10/17/2017 Creatinine0.94, BUN11, Potassium3.6, Sodium134, EGFR>60 10/16/2017 Creatinine0.87, BUN9, Potassium3.6, Sodium135, EGFR>60 10/15/2017 Creatinine0.89, BUN9, Potassium3.8, Sodium132, EGFR>60 12/24/2018Creatinine 0.85, BUN10, Potassium3.9, Sodium133, EGFR>60  12/23/2018Creatinine 0.87, BUN13, Potassium3.6, Sodium135, EGFR>60 12/22/2018Creatinine 0.91, BUN15, Potassium2.8, Sodium133, EGFR>60  12/21/2018Creatinine 0.95, BUN15, Potassium3.2, Sodium134, EGFR>60  12/20/2018Creatinine 1.03, BUN15, Potassium3.6, Sodium135, EGFR>60  A complete set of results can be found in Results Review.  Recommendations:  Advised will review with Dr Caryl Comes in the office for any recommendations.   Explained since patient does not have any fluid symptoms and has an appointment with Dr Percival Spanish on 3/29 then Dr Caryl Comes may wait for patient to be evaluated in the office before making any recommendations. Advised her to call if patient has any fluid symptoms.  Follow-up plan: ICM clinic phone appointment on 01/16/2018 (recheck day before office visit).  Office appointment scheduled 01/17/2018 with Dr. Percival Spanish.  Copy of ICM check sent to Dr. Percival Spanish and Dr. Caryl Comes.   3 month ICM trend: 01/02/2018    1 Year ICM trend:       [name], RN 01/02/2018 9:42 AM

## 2018-01-02 NOTE — Telephone Encounter (Signed)
Spoke with [name], she is aware of SN's recommendations. She stated the patient has been doing well since the fall. Denied any other concerns. Nothing else needed at time of call.

## 2018-01-02 NOTE — Progress Notes (Signed)
Reviewed with Dr Caryl Comes and Furosemide adjustment at this time since patient is asymptomatic and has had severe orthostatic hypotension. He prefer Dr Percival Spanish make any recommendations needed at the 01/17/2018 office visit.  Message sent to Dr Percival Spanish.

## 2018-01-04 ENCOUNTER — Other Ambulatory Visit: Payer: Self-pay | Admitting: Pulmonary Disease

## 2018-01-07 DIAGNOSIS — I110 Hypertensive heart disease with heart failure: Secondary | ICD-10-CM | POA: Diagnosis not present

## 2018-01-07 DIAGNOSIS — K5790 Diverticulosis of intestine, part unspecified, without perforation or abscess without bleeding: Secondary | ICD-10-CM | POA: Diagnosis not present

## 2018-01-07 DIAGNOSIS — I255 Ischemic cardiomyopathy: Secondary | ICD-10-CM | POA: Diagnosis not present

## 2018-01-07 DIAGNOSIS — L89110 Pressure ulcer of right upper back, unstageable: Secondary | ICD-10-CM | POA: Diagnosis not present

## 2018-01-07 DIAGNOSIS — I5043 Acute on chronic combined systolic (congestive) and diastolic (congestive) heart failure: Secondary | ICD-10-CM | POA: Diagnosis not present

## 2018-01-07 DIAGNOSIS — I2510 Atherosclerotic heart disease of native coronary artery without angina pectoris: Secondary | ICD-10-CM | POA: Diagnosis not present

## 2018-01-08 DIAGNOSIS — L89103 Pressure ulcer of unspecified part of back, stage 3: Secondary | ICD-10-CM | POA: Diagnosis not present

## 2018-01-08 DIAGNOSIS — I110 Hypertensive heart disease with heart failure: Secondary | ICD-10-CM | POA: Diagnosis not present

## 2018-01-08 DIAGNOSIS — I739 Peripheral vascular disease, unspecified: Secondary | ICD-10-CM | POA: Diagnosis not present

## 2018-01-08 DIAGNOSIS — I2510 Atherosclerotic heart disease of native coronary artery without angina pectoris: Secondary | ICD-10-CM | POA: Diagnosis not present

## 2018-01-08 DIAGNOSIS — L89104 Pressure ulcer of unspecified part of back, stage 4: Secondary | ICD-10-CM | POA: Diagnosis not present

## 2018-01-08 DIAGNOSIS — J449 Chronic obstructive pulmonary disease, unspecified: Secondary | ICD-10-CM | POA: Diagnosis not present

## 2018-01-09 DIAGNOSIS — I5043 Acute on chronic combined systolic (congestive) and diastolic (congestive) heart failure: Secondary | ICD-10-CM | POA: Diagnosis not present

## 2018-01-09 DIAGNOSIS — I2510 Atherosclerotic heart disease of native coronary artery without angina pectoris: Secondary | ICD-10-CM | POA: Diagnosis not present

## 2018-01-09 DIAGNOSIS — I110 Hypertensive heart disease with heart failure: Secondary | ICD-10-CM | POA: Diagnosis not present

## 2018-01-09 DIAGNOSIS — L89110 Pressure ulcer of right upper back, unstageable: Secondary | ICD-10-CM | POA: Diagnosis not present

## 2018-01-09 DIAGNOSIS — I255 Ischemic cardiomyopathy: Secondary | ICD-10-CM | POA: Diagnosis not present

## 2018-01-09 DIAGNOSIS — K5790 Diverticulosis of intestine, part unspecified, without perforation or abscess without bleeding: Secondary | ICD-10-CM | POA: Diagnosis not present

## 2018-01-10 ENCOUNTER — Ambulatory Visit: Payer: Medicare Other | Admitting: Cardiology

## 2018-01-13 ENCOUNTER — Telehealth: Payer: Self-pay | Admitting: Pulmonary Disease

## 2018-01-13 ENCOUNTER — Other Ambulatory Visit: Payer: Self-pay | Admitting: Pulmonary Disease

## 2018-01-13 DIAGNOSIS — I5043 Acute on chronic combined systolic (congestive) and diastolic (congestive) heart failure: Secondary | ICD-10-CM | POA: Diagnosis not present

## 2018-01-13 DIAGNOSIS — I2510 Atherosclerotic heart disease of native coronary artery without angina pectoris: Secondary | ICD-10-CM | POA: Diagnosis not present

## 2018-01-13 DIAGNOSIS — I110 Hypertensive heart disease with heart failure: Secondary | ICD-10-CM | POA: Diagnosis not present

## 2018-01-13 DIAGNOSIS — K5790 Diverticulosis of intestine, part unspecified, without perforation or abscess without bleeding: Secondary | ICD-10-CM | POA: Diagnosis not present

## 2018-01-13 DIAGNOSIS — I255 Ischemic cardiomyopathy: Secondary | ICD-10-CM | POA: Diagnosis not present

## 2018-01-13 DIAGNOSIS — L89110 Pressure ulcer of right upper back, unstageable: Secondary | ICD-10-CM | POA: Diagnosis not present

## 2018-01-13 NOTE — Telephone Encounter (Signed)
Spoke with [name] with [facility]. Verbal order has been given. Nothing further was needed.

## 2018-01-13 NOTE — Telephone Encounter (Signed)
[name] is calling back [phone]

## 2018-01-13 NOTE — Telephone Encounter (Signed)
lmtcb x1 for [name] with [facility].

## 2018-01-14 ENCOUNTER — Other Ambulatory Visit: Payer: Self-pay | Admitting: Pulmonary Disease

## 2018-01-14 DIAGNOSIS — I110 Hypertensive heart disease with heart failure: Secondary | ICD-10-CM | POA: Diagnosis not present

## 2018-01-14 DIAGNOSIS — I2510 Atherosclerotic heart disease of native coronary artery without angina pectoris: Secondary | ICD-10-CM | POA: Diagnosis not present

## 2018-01-14 DIAGNOSIS — I5043 Acute on chronic combined systolic (congestive) and diastolic (congestive) heart failure: Secondary | ICD-10-CM | POA: Diagnosis not present

## 2018-01-14 DIAGNOSIS — I255 Ischemic cardiomyopathy: Secondary | ICD-10-CM | POA: Diagnosis not present

## 2018-01-14 DIAGNOSIS — L89110 Pressure ulcer of right upper back, unstageable: Secondary | ICD-10-CM | POA: Diagnosis not present

## 2018-01-14 DIAGNOSIS — K5790 Diverticulosis of intestine, part unspecified, without perforation or abscess without bleeding: Secondary | ICD-10-CM | POA: Diagnosis not present

## 2018-01-14 MED ORDER — FLUTICASONE-SALMETEROL 115-21 MCG/ACT IN AERO: 2.0000 | INHALATION_SPRAY | Freq: Two times a day (BID) | RESPIRATORY_TRACT | 5 refills | Status: DC

## 2018-01-15 DIAGNOSIS — I110 Hypertensive heart disease with heart failure: Secondary | ICD-10-CM | POA: Diagnosis not present

## 2018-01-15 DIAGNOSIS — L89104 Pressure ulcer of unspecified part of back, stage 4: Secondary | ICD-10-CM | POA: Diagnosis not present

## 2018-01-15 DIAGNOSIS — J449 Chronic obstructive pulmonary disease, unspecified: Secondary | ICD-10-CM | POA: Diagnosis not present

## 2018-01-15 DIAGNOSIS — I2510 Atherosclerotic heart disease of native coronary artery without angina pectoris: Secondary | ICD-10-CM | POA: Diagnosis not present

## 2018-01-15 DIAGNOSIS — L89103 Pressure ulcer of unspecified part of back, stage 3: Secondary | ICD-10-CM | POA: Diagnosis not present

## 2018-01-15 DIAGNOSIS — I739 Peripheral vascular disease, unspecified: Secondary | ICD-10-CM | POA: Diagnosis not present

## 2018-01-16 ENCOUNTER — Ambulatory Visit (INDEPENDENT_AMBULATORY_CARE_PROVIDER_SITE_OTHER): Payer: Self-pay

## 2018-01-16 ENCOUNTER — Ambulatory Visit: Payer: Medicare Other | Admitting: Pulmonary Disease

## 2018-01-16 DIAGNOSIS — L89110 Pressure ulcer of right upper back, unstageable: Secondary | ICD-10-CM | POA: Diagnosis not present

## 2018-01-16 DIAGNOSIS — Z95810 Presence of automatic (implantable) cardiac defibrillator: Secondary | ICD-10-CM

## 2018-01-16 DIAGNOSIS — K5790 Diverticulosis of intestine, part unspecified, without perforation or abscess without bleeding: Secondary | ICD-10-CM | POA: Diagnosis not present

## 2018-01-16 DIAGNOSIS — I110 Hypertensive heart disease with heart failure: Secondary | ICD-10-CM | POA: Diagnosis not present

## 2018-01-16 DIAGNOSIS — I5043 Acute on chronic combined systolic (congestive) and diastolic (congestive) heart failure: Secondary | ICD-10-CM | POA: Diagnosis not present

## 2018-01-16 DIAGNOSIS — I2510 Atherosclerotic heart disease of native coronary artery without angina pectoris: Secondary | ICD-10-CM | POA: Diagnosis not present

## 2018-01-16 DIAGNOSIS — I5042 Chronic combined systolic (congestive) and diastolic (congestive) heart failure: Secondary | ICD-10-CM

## 2018-01-16 DIAGNOSIS — I255 Ischemic cardiomyopathy: Secondary | ICD-10-CM | POA: Diagnosis not present

## 2018-01-16 NOTE — Progress Notes (Signed)
EPIC Encounter for ICM Monitoring  Patient Name: [name] is a 84 y.o. male Date: 01/16/2018 Primary Care Physican: Noralee Space, MD Primary Cardiologist: Hochrein Electrophysiologist: Faustino Congress Weight:unknown  Spoke withdaughter [name].    Heart Failure questions reviewed, pt asymptomatic.   She said his blood pressure continues to fluctuate with some low readings.    Thoracic impedance abnormal suggesting fluid accumulation since 10/31/2017.  Prescribed dosage: Furosemide20 mg 1 tabletMonday, Wednesday and Friday.Potassium 20 mEq 1 tablet every Monday, Wednesday and Friday. Per 11/14/17 office note from [facility], [place], Furosemide was stopped early January due to patient's significantly orthostatichypotension and was restarted for 3 days a week.  Labs: 12/16/2017 Creatinine 0.90, BUN 17, Potassium 4.4, Sodium 138 11/13/2017 Creatinine 0.92, BUN 16, Potassium 4.3, Sodium 136, EGFR 76-88 10/30/2017 Creatinine 1.06, BUN 14, Potassium 4.6, Sodium 134, EGFR 64-74 10/17/2017 Creatinine0.94, BUN11, Potassium3.6, Sodium134, EGFR>60 10/16/2017 Creatinine0.87, BUN9, Potassium3.6, Sodium135, EGFR>60 10/15/2017 Creatinine0.89, BUN9, Potassium3.8, Sodium132, EGFR>60 12/24/2018Creatinine 0.85, BUN10, Potassium3.9, Sodium133, EGFR>60  12/23/2018Creatinine 0.87, BUN13, Potassium3.6, Sodium135, EGFR>60 12/22/2018Creatinine 0.91, BUN15, Potassium2.8, Sodium133, EGFR>60  12/21/2018Creatinine 0.95, BUN15, Potassium3.2, Sodium134, EGFR>60  12/20/2018Creatinine 1.03, BUN15, Potassium3.6, Sodium135, EGFR>60  A complete set of results can be found in Results Review.  Recommendations:  Advised any recommendations will be given by Dr Percival Spanish in the office tomorrow.    Follow-up plan: ICM clinic phone appointment on 01/23/2018.  Office appointment scheduled 01/17/2018 with  Dr.Hochrein.  Copy of ICM check sent to Dr. Caryl Comes and Dr. Percival Spanish.   3 month ICM trend: 01/16/2018    1 Year ICM trend:       [name], RN 01/16/2018 10:09 AM
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
## 2018-01-17 ENCOUNTER — Ambulatory Visit (INDEPENDENT_AMBULATORY_CARE_PROVIDER_SITE_OTHER): Payer: Medicare Other | Admitting: Cardiology

## 2018-01-17 ENCOUNTER — Encounter: Payer: Self-pay | Admitting: Cardiology

## 2018-01-17 VITALS — BP 94/58 | HR 62 | Ht 71.0 in | Wt 167.4 lb

## 2018-01-17 DIAGNOSIS — I472 Ventricular tachycardia, unspecified: Secondary | ICD-10-CM

## 2018-01-17 DIAGNOSIS — I5042 Chronic combined systolic (congestive) and diastolic (congestive) heart failure: Secondary | ICD-10-CM | POA: Diagnosis not present

## 2018-01-17 DIAGNOSIS — I255 Ischemic cardiomyopathy: Secondary | ICD-10-CM | POA: Diagnosis not present

## 2018-01-17 DIAGNOSIS — I951 Orthostatic hypotension: Secondary | ICD-10-CM | POA: Diagnosis not present

## 2018-01-17 NOTE — Patient Instructions (Signed)
Medication Instructions:  Continue current medications  If you need a refill on your cardiac medications before your next appointment, please call your pharmacy.  Labwork: None Ordered    Testing/Procedures: None Ordered  Follow-Up: Your physician wants you to follow-up in:  2 Months with Kerin Ransom.     Thank you for choosing [facility]!!

## 2018-01-18 ENCOUNTER — Other Ambulatory Visit: Payer: Self-pay | Admitting: Pulmonary Disease

## 2018-01-20 DIAGNOSIS — I2510 Atherosclerotic heart disease of native coronary artery without angina pectoris: Secondary | ICD-10-CM | POA: Diagnosis not present

## 2018-01-20 DIAGNOSIS — L89110 Pressure ulcer of right upper back, unstageable: Secondary | ICD-10-CM | POA: Diagnosis not present

## 2018-01-20 DIAGNOSIS — K5790 Diverticulosis of intestine, part unspecified, without perforation or abscess without bleeding: Secondary | ICD-10-CM | POA: Diagnosis not present

## 2018-01-20 DIAGNOSIS — I110 Hypertensive heart disease with heart failure: Secondary | ICD-10-CM | POA: Diagnosis not present

## 2018-01-20 DIAGNOSIS — I5043 Acute on chronic combined systolic (congestive) and diastolic (congestive) heart failure: Secondary | ICD-10-CM | POA: Diagnosis not present

## 2018-01-20 DIAGNOSIS — I255 Ischemic cardiomyopathy: Secondary | ICD-10-CM | POA: Diagnosis not present

## 2018-01-21 ENCOUNTER — Other Ambulatory Visit: Payer: Self-pay | Admitting: Pulmonary Disease

## 2018-01-21 DIAGNOSIS — I5043 Acute on chronic combined systolic (congestive) and diastolic (congestive) heart failure: Secondary | ICD-10-CM | POA: Diagnosis not present

## 2018-01-21 DIAGNOSIS — I110 Hypertensive heart disease with heart failure: Secondary | ICD-10-CM | POA: Diagnosis not present

## 2018-01-21 DIAGNOSIS — L89110 Pressure ulcer of right upper back, unstageable: Secondary | ICD-10-CM | POA: Diagnosis not present

## 2018-01-21 DIAGNOSIS — K5790 Diverticulosis of intestine, part unspecified, without perforation or abscess without bleeding: Secondary | ICD-10-CM | POA: Diagnosis not present

## 2018-01-21 DIAGNOSIS — C44219 Basal cell carcinoma of skin of left ear and external auricular canal: Secondary | ICD-10-CM | POA: Diagnosis not present

## 2018-01-21 DIAGNOSIS — B078 Other viral warts: Secondary | ICD-10-CM | POA: Diagnosis not present

## 2018-01-21 DIAGNOSIS — I2510 Atherosclerotic heart disease of native coronary artery without angina pectoris: Secondary | ICD-10-CM | POA: Diagnosis not present

## 2018-01-21 DIAGNOSIS — I255 Ischemic cardiomyopathy: Secondary | ICD-10-CM | POA: Diagnosis not present

## 2018-01-22 ENCOUNTER — Encounter (HOSPITAL_BASED_OUTPATIENT_CLINIC_OR_DEPARTMENT_OTHER): Payer: Medicare Other | Attending: Physician Assistant

## 2018-01-22 ENCOUNTER — Other Ambulatory Visit: Payer: Self-pay | Admitting: *Deleted

## 2018-01-22 DIAGNOSIS — J449 Chronic obstructive pulmonary disease, unspecified: Secondary | ICD-10-CM | POA: Diagnosis not present

## 2018-01-22 DIAGNOSIS — I2510 Atherosclerotic heart disease of native coronary artery without angina pectoris: Secondary | ICD-10-CM | POA: Insufficient documentation

## 2018-01-22 DIAGNOSIS — I10 Essential (primary) hypertension: Secondary | ICD-10-CM | POA: Diagnosis not present

## 2018-01-22 DIAGNOSIS — L89103 Pressure ulcer of unspecified part of back, stage 3: Secondary | ICD-10-CM | POA: Diagnosis not present

## 2018-01-22 DIAGNOSIS — L89104 Pressure ulcer of unspecified part of back, stage 4: Secondary | ICD-10-CM | POA: Diagnosis not present

## 2018-01-22 DIAGNOSIS — I739 Peripheral vascular disease, unspecified: Secondary | ICD-10-CM | POA: Diagnosis not present

## 2018-01-22 DIAGNOSIS — Z87891 Personal history of nicotine dependence: Secondary | ICD-10-CM | POA: Diagnosis not present

## 2018-01-22 MED ORDER — POLYETHYLENE GLYCOL 3350 17 GM/SCOOP PO POWD: ORAL | 0 refills | Status: DC

## 2018-01-23 ENCOUNTER — Ambulatory Visit (INDEPENDENT_AMBULATORY_CARE_PROVIDER_SITE_OTHER): Payer: Medicare Other

## 2018-01-23 DIAGNOSIS — I5042 Chronic combined systolic (congestive) and diastolic (congestive) heart failure: Secondary | ICD-10-CM | POA: Diagnosis not present

## 2018-01-23 DIAGNOSIS — Z95810 Presence of automatic (implantable) cardiac defibrillator: Secondary | ICD-10-CM

## 2018-01-23 NOTE — Progress Notes (Signed)
/  EPIC Encounter for ICM Monitoring  Patient Name: [name] is a 84 y.o. male Date: 01/23/2018 Primary Care Physican: Noralee Space, MD Primary Cardiologist: [place] Electrophysiologist: Faustino Congress Weight:unknown  Attempted call todaughter [name].Left detailed message regarding transmission.  Transmission reviewed.  Leg swelling present at last office visit 01/17/2018 and was advised to wear compression stockings.   Thoracic impedance abnormal suggesting fluid accumulation since 10/31/2017, no change in impedance since January.  Prescribed dosage: Furosemide20 mg 1 tabletMonday, Wednesday and Friday.Potassium 20 mEq 1 tablet every Monday, Wednesday and Friday. Per Dr Cherlyn Cushing note 01/17/2018, does not want to increase diuretic due to hypotension and orthostatic issues.  Labs: 12/16/2017 Creatinine 0.90, BUN 17, Potassium 4.4, Sodium 138 11/13/2017 Creatinine 0.92, BUN 16, Potassium 4.3, Sodium 136, EGFR 76-88 10/30/2017 Creatinine 1.06, BUN 14, Potassium 4.6, Sodium 134, EGFR 64-74 10/17/2017 Creatinine0.94, BUN11, Potassium3.6, Sodium134, EGFR>60 10/16/2017 Creatinine0.87, BUN9, Potassium3.6, Sodium135, EGFR>60 10/15/2017 Creatinine0.89, BUN9, Potassium3.8, Sodium132, EGFR>60 12/24/2018Creatinine 0.85, BUN10, Potassium3.9, Sodium133, EGFR>60  12/23/2018Creatinine 0.87, BUN13, Potassium3.6, Sodium135, EGFR>60 12/22/2018Creatinine 0.91, BUN15, Potassium2.8, Sodium133, EGFR>60  12/21/2018Creatinine 0.95, BUN15, Potassium3.2, Sodium134, EGFR>60  12/20/2018Creatinine 1.03, BUN15, Potassium3.6, Sodium135, EGFR>60  A complete set of results can be found in Results Review.  Recommendations: Left voice mail with ICM number and encouraged to call if patient is experiencing any fluid symptoms or has any changes  Follow-up plan: ICM clinic phone appointment on 02/24/2018.     Copy of ICM check sent to Dr. Percival Spanish and Dr. Caryl Comes.   3 month ICM trend: 01/23/2018    1 Year ICM trend:       [name], RN 01/23/2018 12:08 PM

## 2018-01-24 DIAGNOSIS — I2510 Atherosclerotic heart disease of native coronary artery without angina pectoris: Secondary | ICD-10-CM | POA: Diagnosis not present

## 2018-01-24 DIAGNOSIS — L89110 Pressure ulcer of right upper back, unstageable: Secondary | ICD-10-CM | POA: Diagnosis not present

## 2018-01-24 DIAGNOSIS — K5790 Diverticulosis of intestine, part unspecified, without perforation or abscess without bleeding: Secondary | ICD-10-CM | POA: Diagnosis not present

## 2018-01-24 DIAGNOSIS — I110 Hypertensive heart disease with heart failure: Secondary | ICD-10-CM | POA: Diagnosis not present

## 2018-01-24 DIAGNOSIS — I5043 Acute on chronic combined systolic (congestive) and diastolic (congestive) heart failure: Secondary | ICD-10-CM | POA: Diagnosis not present

## 2018-01-24 DIAGNOSIS — I255 Ischemic cardiomyopathy: Secondary | ICD-10-CM | POA: Diagnosis not present

## 2018-01-25 ENCOUNTER — Other Ambulatory Visit: Payer: Self-pay | Admitting: Pulmonary Disease

## 2018-01-27 DIAGNOSIS — I255 Ischemic cardiomyopathy: Secondary | ICD-10-CM | POA: Diagnosis not present

## 2018-01-27 DIAGNOSIS — I2510 Atherosclerotic heart disease of native coronary artery without angina pectoris: Secondary | ICD-10-CM | POA: Diagnosis not present

## 2018-01-27 DIAGNOSIS — I110 Hypertensive heart disease with heart failure: Secondary | ICD-10-CM | POA: Diagnosis not present

## 2018-01-27 DIAGNOSIS — K5790 Diverticulosis of intestine, part unspecified, without perforation or abscess without bleeding: Secondary | ICD-10-CM | POA: Diagnosis not present

## 2018-01-27 DIAGNOSIS — I5043 Acute on chronic combined systolic (congestive) and diastolic (congestive) heart failure: Secondary | ICD-10-CM | POA: Diagnosis not present

## 2018-01-27 DIAGNOSIS — L89110 Pressure ulcer of right upper back, unstageable: Secondary | ICD-10-CM | POA: Diagnosis not present

## 2018-01-29 DIAGNOSIS — I739 Peripheral vascular disease, unspecified: Secondary | ICD-10-CM | POA: Diagnosis not present

## 2018-01-29 DIAGNOSIS — I10 Essential (primary) hypertension: Secondary | ICD-10-CM | POA: Diagnosis not present

## 2018-01-29 DIAGNOSIS — L89104 Pressure ulcer of unspecified part of back, stage 4: Secondary | ICD-10-CM | POA: Diagnosis not present

## 2018-01-29 DIAGNOSIS — Z87891 Personal history of nicotine dependence: Secondary | ICD-10-CM | POA: Diagnosis not present

## 2018-01-29 DIAGNOSIS — J449 Chronic obstructive pulmonary disease, unspecified: Secondary | ICD-10-CM | POA: Diagnosis not present

## 2018-01-29 DIAGNOSIS — L89103 Pressure ulcer of unspecified part of back, stage 3: Secondary | ICD-10-CM | POA: Diagnosis not present

## 2018-01-29 DIAGNOSIS — I2510 Atherosclerotic heart disease of native coronary artery without angina pectoris: Secondary | ICD-10-CM | POA: Diagnosis not present

## 2018-01-30 NOTE — Progress Notes (Signed)
Office Visit Note  Patient: [name]             Date of Birth: [birth year]           MRN: [id number]             PCP: Noralee Space, MD Referring: Jessy Oto, MD Visit Date: 02/13/2018 Occupation: @GUAROCC @ Accompanied by his daughter [name]  Subjective:  Vertebral fracture.   History of Present Illness: [name] is a 84 y.o. male seen in consultation per request of Dr. Louanne Skye.  Patient does not recall how long he has had osteoporosis.  According to records he had a T12 compression fracture in the past in 1999 which required vertebroplasty.  He also had L3 vertebral fracture noted by Dr. Louanne Skye.  He does not recall if he had a bone density before.  He does not recall having a diagnosis of osteoporosis.  He recently developed a wound on his spine before he has been going to wound center is not completely healed.  He does walk with the help of a walker.  He has had frequent falls 2 falls within the last 2 months.  Activities of Daily Living:  Patient reports morning stiffness for 0 minute.   Patient Denies nocturnal pain.  Difficulty dressing/grooming: Denies Difficulty climbing stairs: Reports Difficulty getting out of chair: Reports Difficulty using hands for taps, buttons, cutlery, and/or writing: Denies   Review of Systems  Constitutional: Positive for fatigue. Negative for night sweats.  HENT: Negative for mouth sores, mouth dryness and nose dryness.   Eyes: Negative for redness and dryness.  Respiratory: Negative for shortness of breath and difficulty breathing.   Cardiovascular: Negative for chest pain, palpitations, hypertension, irregular heartbeat and swelling in legs/feet.  Gastrointestinal: Negative for constipation and diarrhea.  Endocrine: Negative for increased urination.  Musculoskeletal: Negative for arthralgias, joint pain, joint swelling, myalgias, muscle weakness, morning stiffness, muscle tenderness and myalgias.  Skin: Negative for color  change, rash, hair loss, nodules/bumps, skin tightness, ulcers and sensitivity to sunlight.  Allergic/Immunologic: Negative for susceptible to infections.  Neurological: Negative for dizziness, fainting, memory loss, night sweats and weakness ( ).  Hematological: Negative for swollen glands.  Psychiatric/Behavioral: Negative for depressed mood and sleep disturbance. The patient is not nervous/anxious.     PMFS History:  Patient Active Problem List   Diagnosis Date Noted  . Physical deconditioning 11/13/2017  . Weakness 11/12/2017  . Falls 11/12/2017  . Pleural effusion   . Pressure injury of skin 10/01/2017  . Acute hypoxemic respiratory failure ([place]) 09/30/2017  . CHF exacerbation ([place]) 09/30/2017  . Shingles outbreak 05/27/2017  . Seborrheic dermatitis 05/27/2017  . Acute on chronic respiratory failure with hypoxia ([place]) 01/13/2017  . COPD with acute bronchitis ([place]) 01/13/2017  . CAD S/P percutaneous coronary angioplasty   . Chronic combined systolic and diastolic CHF (congestive heart failure) ([place])   . Blunt chest trauma 05/04/2016  . 4.1 cm Aneurysm of thoracic aorta ([place]) 04/27/2016  . SIADH (syndrome of inappropriate ADH production) ([place])   . Orthostatic hypotension 04/12/2016  . TBI (traumatic brain injury) ([place])   . Acute on chronic combined systolic and diastolic congestive heart failure ([place])   . S/P ORIF (open reduction internal fixation) fracture   . Thrombocytopenia ([place])   . Urinary retention   . Slow transit constipation   . Thyroid activity decreased   . Traumatic subdural hematoma with loss of consciousness ([place])   . Pulmonary hypertension ([place])   .  Cardiomyopathy, ischemic   . Subdural hematoma ([place])   . Cardiac arrest ([place]) 03/29/2016  . 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 CAP (community acquired pneumonia) 04/28/2015  . T wave over sensing resulting in inappropriate shocks 08/14/2013  . Vitamin D deficiency disease 04/09/2012  .  Actinic skin damage 05/22/2011  . Alcohol use 03/22/2011  . Other dysphagia 02/16/2011  . Carotid arterial disease ([place]) 11/17/2009  . GOUT 09/23/2009  . SYNCOPE 04/19/2009  . Closed fracture of bone 07/27/2008  . Automatic implantable cardioverter-defibrillator in situ 04/13/2008  . COLONIC POLYPS 12/09/2007  . DIVERTICULOSIS OF COLON 12/09/2007  . Essential hypertension 12/08/2007  . Peripheral vascular disease ([place]) 12/08/2007  . COPD GOLD III 12/08/2007  . Osteoarthritis 12/08/2007    Past Medical History:  Diagnosis Date  . AAA (abdominal aortic aneurysm) ([place])    a. 2002 s/p repair.  . Allergic rhinitis   . Back pain   . CAD (coronary artery disease)    a. 02/2002 H/o MI with stenting x 2; b. 04/2013 MV: EF 25%, large posterior lateral infarct w/o ischemia; c. 03/2016 VT Arrest/Cath: LM 60ost, LAD 40p/m ISR, LCX 100p/m, RCA nl-->Med Rx.  . Carotid arterial disease ([place])    a. 12/2010 s/p R CEA;  b. 05/2015 Carotid U/S: bilat <40% ICA stenosis.  . Chronic combined systolic and diastolic CHF (congestive heart failure) ([place])    a. 03/2016 Echo: Ef 35-40%, grade 1 DD.  Marland Kitchen Compression fracture   . COPD (chronic obstructive pulmonary disease) ([place])   . Dermatitis   . Diverticulosis of colon   . DJD (degenerative joint disease)    and Gout  . Hemorrhoids   . History of colonic polyps   . History of gout   . History of pneumonia   . Hypercholesterolemia   . Hypertension   . Hypertensive heart disease   . Ischemic cardiomyopathy    a. EF prev <35%-->improved to normal by Echo 8/14:  Mild LVH, focal basal hypertrophy, EF 60-65%, normal wall motion, mild BAE, PASP 36; c. 03/2016 Echo: EF 35-40%, Gr1 DD, triv AI, mild MR, mod dil LA, mild-mod TR, PASP 51mmHg.  . Osteoporosis   . Peripheral vascular disease ([place])   . Presence of cardiac defibrillator    a. 03/2008 s/p MDT [id number] Maximo II DR, DC AICD; b. 03/2013: ICD shock for T wave oversensing;  c. 10/2015: collective decision not to  replace ICD given improvement in LV fxn; d. VT Arrest-->Gen change to MDT ser # [id number].  . Skin cancer    shoulders and forehead  . Syncope   . T wave over sensing resulting in inappropriate shocks    a. 03/2013.  . Tobacco abuse   . Ventricular tachycardia ([place]) 04/01/2016    Family History  Problem Relation Age of Onset  . Hypertension Father   . Heart disease Father        Heart Disease before age 44  . Hypertension Mother   . Heart disease Mother        Heart Disease before age 33  . Cancer Mother   . Scoliosis Sister   . Thyroid disease Daughter   . Anxiety disorder Son    Past Surgical History:  Procedure Laterality Date  . ABDOMINAL AORTIC ANEURYSM REPAIR  2002   by Dr. Kellie Simmering  . aicd placed  03/2008   Dr. Caryl Comes  . cad stent  02/2002   Dr. Percival Spanish  . CARDIAC CATHETERIZATION  X 2 stents  . CARDIAC CATHETERIZATION N/A 04/03/2016   Procedure: Left Heart Cath and Coronary Angiography;  Surgeon: Belva Crome, MD;  Location: [facility];  Service: Cardiovascular;  Laterality: N/A;  . CARDIAC DEFIBRILLATOR PLACEMENT  2009  . CARDIAC DEFIBRILLATOR PLACEMENT    . CAROTID ENDARTERECTOMY Right January 02, 2011  . EAR CYST EXCISION Left 12/13/2015   Procedure: Excision left ear lesion ;  Surgeon: Melissa Montane, MD;  Location: [facility];  Service: ENT;  Laterality: Left;  . EP IMPLANTABLE DEVICE N/A 04/06/2016   Procedure:  ICD Generator Changeout;  Surgeon: Deboraha Sprang, MD;  Location: [facility];  Service: Cardiovascular;  Laterality: N/A;  . EXCISION OF LESION LEFT EAR Left 12/13/2015  . I&D EXTREMITY Left 04/23/2016   Procedure: IRRIGATION AND DEBRIDEMENT HAND;  Surgeon: Leandrew Koyanagi, MD;  Location: [facility];  Service: Orthopedics;  Laterality: Left;  . IR IMAGE GUIDED DRAINAGE BY PERCUTANEOUS CATHETER  10/08/2017  . OPEN REDUCTION INTERNAL FIXATION (ORIF) METACARPAL Left 04/04/2016   Procedure: OPEN REDUCTION INTERNAL FIXATION (ORIF) LEFT 2ND, 3RD, 4TH METACARPAL  FRACTURE;  Surgeon: Leandrew Koyanagi, MD;  Location: [facility];  Service: Orthopedics;  Laterality: Left;  OPEN REDUCTION INTERNAL FIXATION (ORIF) LEFT 2ND, 3RD, 4TH METACARPAL FRACTURE  . OPEN REDUCTION INTERNAL FIXATION (ORIF) METACARPAL Left 04/23/2016   Procedure: REVISION OPEN REDUCTION INTERNAL FIXATION (ORIF) 2ND METACARPAL;  Surgeon: Leandrew Koyanagi, MD;  Location: [facility];  Service: Orthopedics;  Laterality: Left;  . right corotid enderectomy  12/2010   Dr. Scot Dock  . SKIN SPLIT GRAFT Left 12/13/2015   Procedure: with possible skin graft;  Surgeon: Melissa Montane, MD;  Location: [facility];  Service: ENT;  Laterality: Left;  . TONSILLECTOMY     Social History   Social History Narrative   ** Merged History Encounter **       Lives locally.  Had been living with wife who had become quite ill recently and died on the evening of 04/03/2016.     Objective: Vital Signs: BP 111/65 (BP Location: Left Arm, Patient Position: Sitting, Cuff Size: Normal)   Pulse 61   Resp 17   Ht 5\' 11"  (1.803 m)   Wt 166 lb (75.3 kg)   BMI 23.15 kg/m    Physical Exam  Constitutional: He is oriented to person, place, and time. He appears well-developed and well-nourished.  HENT:  Head: Normocephalic and atraumatic.  Eyes: Pupils are equal, round, and reactive to light. Conjunctivae and EOM are normal.  Neck: Normal range of motion. Neck supple.  Cardiovascular: Normal rate, regular rhythm and normal heart sounds.  Pulmonary/Chest: Effort normal and breath sounds normal.  Abdominal: Soft. Bowel sounds are normal.  Neurological: He is alert and oriented to person, place, and time.  Skin: Skin is warm and dry. Capillary refill takes less than 2 seconds.  Psychiatric: He has a normal mood and affect. His behavior is normal.  Nursing note and vitals reviewed.    Musculoskeletal Exam: C-spine limited range of motion.  He has thoracic kyphosis.  He also has lumbar kyphosis.  He has a bandage over the lower lumbar region where he  has wound.  Shoulder joints elbow joints wrist joints were in good range of motion.  He has some DIP PIP thickening.  Hip joints knee joints were in good range of motion.  He has difficulty with balance and mobility.  He used a walker to mobilize.  CDAI Exam: No CDAI  exam completed.    Investigation: No additional findings. CBC Latest Ref Rng & Units 02/10/2018 12/16/2017 10/30/2017  WBC 4.0 - 10.5 K/uL 9.0 9.5 7.6  Hemoglobin 13.0 - 17.0 g/dL 13.2 13.7 12.7(L)  Hematocrit 39.0 - 52.0 % 39.4 40.1 38.1(L)  Platelets 150 - 400 K/uL 132(L) 133.0(L) 237.0   CMP Latest Ref Rng & Units 02/10/2018 12/16/2017 11/13/2017  Glucose 65 - 99 mg/dL 88 71 86  BUN 6 - 20 mg/dL 23(H) 17 16  Creatinine 0.61 - 1.24 mg/dL 0.98 0.90 0.92  Sodium 135 - 145 mmol/L 134(L) 138 136  Potassium 3.5 - 5.1 mmol/L 4.0 4.4 4.3  Chloride 101 - 111 mmol/L 101 101 98  CO2 22 - 32 mmol/L 24 30 24   Calcium 8.9 - 10.3 mg/dL 8.9 9.5 8.7  Total Protein 6.5 - 8.1 g/dL 6.7 7.0 6.5  Total Bilirubin 0.3 - 1.2 mg/dL 0.5 0.5 0.3  Alkaline Phos 38 - 126 U/L 88 98 98  AST 15 - 41 U/L 26 25 27   ALT 17 - 63 U/L 18 16 21     Imaging: Dg Chest 2 View  Result Date: 02/10/2018 CLINICAL DATA:  Productive cough and shortness of breath for the past 2 days. Former smoker. Previous episodes of pneumonia. EXAM: CHEST - 2 VIEW COMPARISON:  None in PACs FINDINGS: The lungs are mildly hyperinflated with hemidiaphragm flattening. There is no focal infiltrate. The interstitial markings are coarse in the right upper lobe and right mid to lower lung. The cardiac silhouette is top-normal in size. The pulmonary vascularity is normal. There is tortuosity of the descending thoracic aorta with mural calcification in the arch and descending aorta. There is wedge compression of the body of T12 which is been treated with kyphoplasty. The ICD is in reasonable position. IMPRESSION: COPD with pleuroparenchymal changes which may reflect scarring but without previous  studies 1 cannot exclude acute pneumonia. No overt CHF. Followup PA and lateral chest X-ray is recommended in 3-4 weeks following trial of antibiotic therapy to ensure resolution and exclude underlying malignancy. Thoracic aortic atherosclerosis. Electronically Signed   By: David  Martinique M.D.   On: 02/10/2018 09:24   Ct Head Wo Contrast  Result Date: 02/10/2018 CLINICAL DATA:  Pain following fall EXAM: CT HEAD WITHOUT CONTRAST TECHNIQUE: Contiguous axial images were obtained from the base of the skull through the vertex without intravenous contrast. COMPARISON:  None. FINDINGS: Brain: There is moderate diffuse atrophy. There is no intracranial mass, hemorrhage, extra-axial fluid collection, or midline shift. There is patchy small vessel disease throughout the centra semiovale bilaterally. There is no demonstrable acute infarct. Vascular: No hyperdense vessel. There is calcification in both distal vertebral arteries as well as in the carotid siphon regions bilaterally. Skull: Bony calvarium appears intact. Sinuses/Orbits: There is opacification of much of the right maxillary antrum as well as throughout multiple ethmoid air cells on the right. There is opacification in the inferior right frontal sinus region. There is mild mucosal thickening in several left-sided ethmoid air cells. There appears to be bony expansion in several areas of the ethmoid air cell complex on the right. There is leftward deviation of the nasal septum. Visualized orbits appear symmetric bilaterally. Other: Mastoid air cells are clear. IMPRESSION: Atrophy with patchy periventricular small vessel disease. No acute infarct evident. No mass or hemorrhage. Multifocal arterial vascular calcification. Multifocal paranasal sinus disease. Areas of apparent bony expansion in several ethmoid air cells on the right, likely due to chronic polypoid change. Deviated nasal septum toward  the left. Electronically Signed   By: Lowella Grip III M.D.    On: 02/10/2018 09:33   Ct Angio Chest Pe W/cm &/or Wo Cm  Result Date: 02/10/2018 CLINICAL DATA:  Shortness of breath EXAM: CT ANGIOGRAPHY CHEST WITH CONTRAST TECHNIQUE: Multidetector CT imaging of the chest was performed using the standard protocol during bolus administration of intravenous contrast. Multiplanar CT image reconstructions and MIPs were obtained to evaluate the vascular anatomy. CONTRAST:  143mL ISOVUE-370 IOPAMIDOL (ISOVUE-370) INJECTION 76% COMPARISON:  Chest radiograph February 10, 2018 FINDINGS: Cardiovascular: There is no demonstrable pulmonary embolus. The ascending thoracic aortic diameter is 4.3 x 4.1 cm. No dissection is evident on this study. It should noted that the contrast bolus is suboptimal for exclusion radiographically thoracic dissection. There is extensive calcification proximal visualized great vessels. There is also calcification in the aorta as well as calcification in multiple coronary artery branches. Pacemaker lead tips are attached to the right atrium and right ventricle. No pericardial effusion or pericardial thickening evident. Main pulmonary outflow tract measures 3.3 cm, abnormally prominent. Mediastinum/Nodes: Thyroid appears unremarkable. There are scattered subcentimeter lymph nodes. There is no frank adenopathy by size criteria. No esophageal lesions are demonstrable. Lungs/Pleura: There is underlying centrilobular emphysematous change. There is a free-flowing pleural effusion on the right, fairly small. There is lower lobe atelectatic change bilaterally. There is consolidation in a portion of the posterior segment right lower lobe which is suspected to represent pneumonia superimposed atelectasis. Upper Abdomen: In the visualized upper abdomen, there is reflux of contrast into the inferior vena cava. There is atherosclerotic calcification in the aorta and major mesenteric arterial vessels. Visualized upper abdominal structures otherwise appear normal.  Musculoskeletal: The patient has had a previous kyphoplasty procedure at T12. There is anterior wedging of the T10, T12, and L2 vertebral bodies. There are lucent areas within the T11 and L1 vertebral bodies. There are old healed rib fractures on the right and left sides. Review of the MIP images confirms the above findings. IMPRESSION: 1.  No demonstrable pulmonary embolus. 2. Prominence of the ascending thoracic aorta measuring 4.3 x 4.1 cm. No dissection seen. It should be noted that the contrast bolus is not sufficient for confident exclusion of dissection from a radiographic standpoint. Recommend annual imaging followup by CTA or MRA. This recommendation follows 2010 ACCF/AHA/AATS/ACR/ASA/SCA/SCAI/SIR/STS/SVM Guidelines for the Diagnosis and Management of Patients with Thoracic Aortic Disease. Circulation. 2010; 121: H852-D782. Note that there is extensive arterial vascular calcification in the aorta as well as extensive coronary artery and great vessel calcification. 3. Fractures in the lower thoracic and upper lumbar region. There are lucent appearing areas in the T11 and L1 vertebral bodies. Possibility of metastatic disease in these areas cannot be excluded by current examination. 4. Small right pleural effusion. Bibasilar atelectasis. Probable pneumonia in a portion of the posterior segment of the right lower lobe. There is underlying centrilobular emphysematous change. There is a small right pleural effusion. 5. Prominence of the main pulmonary outflow tract, a finding likely indicative of underlying pulmonary arterial hypertension. 6.  No evident adenopathy. 7. Reflux of contrast into the inferior vena cava, a finding that potentially may represent a degree of increased right heart pressure. 8.  Pacemaker leads attached right atrium and right ventricle. Aortic Atherosclerosis (ICD10-I70.0) and Emphysema (ICD10-J43.9). Electronically Signed   By: Lowella Grip III M.D.   On: 02/10/2018 13:02     Speciality Comments: No specialty comments available.    Procedures:  No procedures performed Allergies: Lisinopril  Assessment / Plan:     Visit Diagnoses: Age-related osteoporosis with current pathological fracture with routine healing - "hx of T12 compression fracture around the time of previous aortic aneursym repair in 1999. That fracture was treated with vertebroplasty"unable to find DEXA.  Patient and his daughter does not recall if he had a recent bone density.  We will schedule a bone density.  We also discussed because of the history of vertebral compression fracture he will most likely need Forteo or Tymlos.  I will obtain following labs today.  Use of calcium vitamin D and  resistive exercises were discussed.- Plan: Parathyroid hormone, intact (no Ca), VITAMIN D 25 Hydroxy (Vit-D Deficiency, Fractures), Phosphorus, TSH, Serum protein electrophoresis with reflex, CBC with Differential/Platelet, COMPLETE METABOLIC PANEL WITH GFR  Closed compression fracture of L3 lumbar vertebra, sequela-patient gives history of smoking in the past he also has COPD.  He recalls taking prednisone in the past for COPD.  He does not recall taking any treatment for osteoporosis in the past.  Vitamin D deficiency-he is uncertain if he is taking vitamin D.  Other medical problems are listed as follows:  History of subdural hematoma  History of traumatic brain injury  Acute on chronic combined systolic and diastolic congestive heart failure (HCC)  Essential hypertension  Peripheral vascular disease (HCC)  COPD GOLD III  SIADH (syndrome of inappropriate ADH production) (HCC)  Basal cell carcinoma (BCC) of auricle of left ear  History of diverticulosis    Orders: Orders Placed This Encounter  Procedures  . Parathyroid hormone, intact (no Ca)  . VITAMIN D 25 Hydroxy (Vit-D Deficiency, Fractures)  . Phosphorus  . TSH  . Serum protein electrophoresis with reflex  . CBC with  Differential/Platelet  . COMPLETE METABOLIC PANEL WITH GFR   No orders of the defined types were placed in this encounter.   Face-to-face time spent with patient was 30 minutes.  Greater than 50% of time was spent in counseling and coordination of care.  Follow-Up Instructions: Return for Vertebral fracture.   Bo Merino, MD  Note - This record has been created using Editor, commissioning.  Chart creation errors have been sought, but may not always  have been located. Such creation errors do not reflect on  the standard of medical care.

## 2018-01-31 DIAGNOSIS — K5790 Diverticulosis of intestine, part unspecified, without perforation or abscess without bleeding: Secondary | ICD-10-CM | POA: Diagnosis not present

## 2018-01-31 DIAGNOSIS — I2510 Atherosclerotic heart disease of native coronary artery without angina pectoris: Secondary | ICD-10-CM | POA: Diagnosis not present

## 2018-01-31 DIAGNOSIS — I110 Hypertensive heart disease with heart failure: Secondary | ICD-10-CM | POA: Diagnosis not present

## 2018-01-31 DIAGNOSIS — L89110 Pressure ulcer of right upper back, unstageable: Secondary | ICD-10-CM | POA: Diagnosis not present

## 2018-01-31 DIAGNOSIS — I5043 Acute on chronic combined systolic (congestive) and diastolic (congestive) heart failure: Secondary | ICD-10-CM | POA: Diagnosis not present

## 2018-01-31 DIAGNOSIS — I255 Ischemic cardiomyopathy: Secondary | ICD-10-CM | POA: Diagnosis not present

## 2018-02-03 ENCOUNTER — Ambulatory Visit (INDEPENDENT_AMBULATORY_CARE_PROVIDER_SITE_OTHER): Payer: Medicare Other | Admitting: Pulmonary Disease

## 2018-02-03 ENCOUNTER — Encounter: Payer: Self-pay | Admitting: Pulmonary Disease

## 2018-02-03 VITALS — BP 118/64 | HR 88 | Temp 97.9°F | Ht 71.0 in | Wt 167.0 lb

## 2018-02-03 DIAGNOSIS — J181 Lobar pneumonia, unspecified organism: Secondary | ICD-10-CM

## 2018-02-03 DIAGNOSIS — I255 Ischemic cardiomyopathy: Secondary | ICD-10-CM

## 2018-02-03 DIAGNOSIS — J90 Pleural effusion, not elsewhere classified: Secondary | ICD-10-CM | POA: Diagnosis not present

## 2018-02-03 DIAGNOSIS — Z9861 Coronary angioplasty status: Secondary | ICD-10-CM | POA: Diagnosis not present

## 2018-02-03 DIAGNOSIS — M150 Primary generalized (osteo)arthritis: Secondary | ICD-10-CM

## 2018-02-03 DIAGNOSIS — I2510 Atherosclerotic heart disease of native coronary artery without angina pectoris: Secondary | ICD-10-CM | POA: Diagnosis not present

## 2018-02-03 DIAGNOSIS — I272 Pulmonary hypertension, unspecified: Secondary | ICD-10-CM

## 2018-02-03 DIAGNOSIS — J449 Chronic obstructive pulmonary disease, unspecified: Secondary | ICD-10-CM | POA: Diagnosis not present

## 2018-02-03 DIAGNOSIS — R5381 Other malaise: Secondary | ICD-10-CM

## 2018-02-03 DIAGNOSIS — W19XXXD Unspecified fall, subsequent encounter: Secondary | ICD-10-CM | POA: Diagnosis not present

## 2018-02-03 DIAGNOSIS — I5042 Chronic combined systolic (congestive) and diastolic (congestive) heart failure: Secondary | ICD-10-CM | POA: Diagnosis not present

## 2018-02-03 DIAGNOSIS — Z95810 Presence of automatic (implantable) cardiac defibrillator: Secondary | ICD-10-CM | POA: Diagnosis not present

## 2018-02-03 DIAGNOSIS — J189 Pneumonia, unspecified organism: Secondary | ICD-10-CM

## 2018-02-03 DIAGNOSIS — M159 Polyosteoarthritis, unspecified: Secondary | ICD-10-CM

## 2018-02-03 NOTE — Patient Instructions (Signed)
Today we updated your med list in our EPIC system...    Continue your current medications the same...  Keep up the good work w/ your eating & your exercise!  Take good care-- no falling allowed!  I printed out the [facility] site for you w/ the $50 coupon...  Call for any questions...  Let's plan a follow up visit in 23mo, sooner if needed for problems.Marland KitchenMarland Kitchen

## 2018-02-04 ENCOUNTER — Encounter: Payer: Self-pay | Admitting: Pulmonary Disease

## 2018-02-04 ENCOUNTER — Telehealth: Payer: Self-pay | Admitting: Pulmonary Disease

## 2018-02-04 DIAGNOSIS — I5043 Acute on chronic combined systolic (congestive) and diastolic (congestive) heart failure: Secondary | ICD-10-CM | POA: Diagnosis not present

## 2018-02-04 DIAGNOSIS — L89110 Pressure ulcer of right upper back, unstageable: Secondary | ICD-10-CM | POA: Diagnosis not present

## 2018-02-04 DIAGNOSIS — I255 Ischemic cardiomyopathy: Secondary | ICD-10-CM | POA: Diagnosis not present

## 2018-02-04 DIAGNOSIS — K5790 Diverticulosis of intestine, part unspecified, without perforation or abscess without bleeding: Secondary | ICD-10-CM | POA: Diagnosis not present

## 2018-02-04 DIAGNOSIS — I110 Hypertensive heart disease with heart failure: Secondary | ICD-10-CM | POA: Diagnosis not present

## 2018-02-04 DIAGNOSIS — I2510 Atherosclerotic heart disease of native coronary artery without angina pectoris: Secondary | ICD-10-CM | POA: Diagnosis not present

## 2018-02-04 NOTE — Telephone Encounter (Signed)
Called and spoke with [place] from [facility]. She is requesting verbals for home skilled nursing visits once a week for 4 weeks then once every other week for one month. This will be for disease teaching and medication management.   SN please advise, thanks.

## 2018-02-04 NOTE — Telephone Encounter (Signed)
Per SN- Yes, it is ok to continue with home care with Patient approval.  [name] had stated he doesn't want any more home care intervention. Spoke with Margaretha Sheffield at [facility]. She stated that [name], his daughter, [facility] nurse, and [facility] PT met today and he agreed to continue with home care. She stated that if anything changed, she would let SN know. Nothing further at this time.

## 2018-02-04 NOTE — Progress Notes (Signed)
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 42mo/18>    There has been no recent change in his medications that could otherw explain the 2 isolated hallucinations, he denies focal weakness, speech problems, memory issues, etc; we discussed further eval w/ repeat blood work, brain scan, etc but he is not in favor of proceeding at this time & will call for any further issues... Continue current meds, no salt, watch  weight & continue [facility] clinic monitoring. 10/2017>  Worsening postural hypotension, weakness, gait abn & falling- we held Lasix & asked to hold Coreg if BP<100; f/u w/ Cards to consider options & poss Midodrine rx... 12/16/17>   We decided to continue same meds- cardiac meds, Midodrine, Lasix, etc; concentrate on DIET & EXERCISE- no salt, incr nutrtional supplements, etc; see if Cards wants to incr diuretics over time;  empirically Rx sed/eos w/ Medrol Dosepak to see if it helps; plan to incr Levothyroid to 37mg/d for pt on Amio w/ elev TSH; DrHochrein will check pt in 177mo I will see him 61m87moter that to tag-team his rx. 02/03/18>   JacGerardo stable at present- reminded of meds, no salt, support hose, incr exercise;  We will continue to "tag-team" him w/ Cards & alternate months; hopefully he will remain stable, continue to improve, & remain out of the ER/ hosp.   GOLD stage3 COPD>  he is congratulated on quitting smoking completely & remaining off cigs; Advised to stay on the ICS/LABA (Advair Bid) regularly, he does not want additional meds...  05/04/16>  Continue the ADVAIR250Bid, Mucinex600-2Bid 12/2016>  He was hosp w/ ac on chr sys&diast CHF + COPD exac; treated w/ Doxy, Pred, Nebs and improved... 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  CHOL>  FLP looked good on Simva40 & Feno160, the latter was stopped by DrHochrein; the Simva40 is changed to PRAV40 [facility]2016 [facility]...  GI/ Dysphagia>  He notes symptoms resolved spont & he does not believe that he has a problem in this area, declines PPI Rx or GI eval...  LBP w/ compression fx T12 in 2009 w/ vertebroplasty>>  FALL 4/12 w/ signif trauma & right rib fxs>   OSTEOPOROSIS>  On calcium, MVI, VitD 2000u/d;  F/u BMD -3.2 in R FemNeck & Alendronate70/wk started 8/15 but pt didn't stick w/ this med & stopped on his own... New part  compression T10 found 8/15 on routine CXR & L2/ L3 on CT Lumbar spine 10/18> he declined to restart Alendronate or consider alternative therapy... Subseq part compression L2 & mod compression L3 evaluated by DrNShara Blazing/2018 => pain rx, PT, refer to Rheum for more aggressive management of osteoporosis...  Hx SHINGLES superimposed on his Dermatitis>> presented 05/2017 w/ left C2 distrib shingles... 05/27/17>   JacWinfreds a signif shingles outbreak over the left C2 distrib although I cannot r/o some Trigem nerve overlap here;  He needs to continue the Keflex Bid & ValtrexTid til gone, and we will start a Pred taper for the amt of imflamm present;  His skin/ scalp needs attention from his severe seb derm + the njew shingles outbreak> I suggest that he get in the shower/ tub Bid & gently wash/ soak the area on his neck & scalp, then gently use a washcloth to remove dead skin & clean the area; pat dry w/ clean towels; then he can apply the salve provided  by Derm (topical lidocaine gel) if needed for pain... He needs to have this checked again in about 1wk by myself or Derm & have his Seb Dermatitis addressed more vigorously + prob excision of the left leg lesion 06/18/17>   Axxel has a combination of seb dermatitis on scalp/ face w/ flaking etc, and shingles/ post herpetic neuralgia involving mainly left C2 distrib;  We reviewed Tramadol/ Tylenol for pain + try Lotrosone cream for the seb derm- he really needs to work on tis to clean it up    Patient's Medications  New Prescriptions   No medications on file  Previous Medications   ALBUTEROL (PROVENTIL HFA;VENTOLIN HFA) 108 (90 BASE) MCG/ACT INHALER    Inhale 2 puffs into the lungs every 6 (six) hours as needed for wheezing or shortness of breath.   ALENDRONATE (FOSAMAX) 70 MG TABLET    Take 1 tablet (70 mg total) by mouth once a week. Take with a full glass of water on an empty stomach.   ALLOPURINOL (ZYLOPRIM) 100 MG TABLET    TAKE 1 TABLET ONCE DAILY.    AMIODARONE (PACERONE) 200 MG TABLET    TAKE 1 TABLET ONCE DAILY.   CARVEDILOL (COREG) 3.125 MG TABLET    TAKE 1 TABLET TWICE DAILY WITH A MEAL.   CHOLECALCIFEROL (VITAMIN D) 1000 UNITS TABLET    Take 1,000 Units by mouth daily.   CLOPIDOGREL (PLAVIX) 75 MG TABLET    TAKE 1 TABLET ONCE DAILY.   FLUCONAZOLE (DIFLUCAN) 100 MG TABLET    2 tablets first day followed by one daily until finished. Take QHS   FLUTICASONE-SALMETEROL (ADVAIR HFA) 115-21 MCG/ACT INHALER    Inhale 2 puffs into the lungs 2 (two) times daily.   FLUTICASONE-SALMETEROL 113-14 MCG/ACT AEPB    INHALE 2 PUFFS TWICE DAILY   FUROSEMIDE (LASIX) 20 MG TABLET    Take 1 tablet Monday, Wednesday and Friday   GUAIFENESIN (MUCINEX) 600 MG 12 HR TABLET    Take 600-1,200 mg by mouth 2 (two) times daily. Takes two in the morning and one in the evening.   LEVOTHYROXINE (SYNTHROID, LEVOTHROID) 50 MCG TABLET    Take 50 mcg by mouth daily before breakfast.   MAGNESIUM OXIDE (MAG-OX) 400 (241.3 MG) MG TABLET    Take 0.5 tablets (200 mg total) by mouth daily.   MALTODEXTRIN-XANTHAN GUM (RESOURCE THICKENUP CLEAR) POWD    Take 120 g by mouth as needed.   MIDODRINE (PROAMATINE) 2.5 MG TABLET    Take 1 tablet (2.5 mg total) by mouth 3 (three) times daily with meals.   MULTIPLE VITAMIN (MULTIVITAMIN) CAPSULE    Take 1 capsule by mouth daily.   OXYBUTYNIN (DITROPAN) 5 MG TABLET    TAKE 1 TABLET BY MOUTH TWICE DAILY.   POLYETHYLENE GLYCOL POWDER (QC NATURA-LAX) POWDER    DISSOLVE ONE CAPFUL IN 8 0Z. WATER TWICE DAILY.   POTASSIUM CHLORIDE (K-DUR) 10 MEQ TABLET    Take 1 tablet Monday, Wednesday and Friday   PRAVASTATIN (PRAVACHOL) 40 MG TABLET    TAKE 1 TABLET ONCE DAILY.   SERTRALINE (ZOLOFT) 50 MG TABLET    TAKE ONE TABLET AT BEDTIME.   THIAMINE 100 MG TABLET    Take 1 tablet (100 mg total) by mouth daily.   TRAMADOL (ULTRAM) 50 MG TABLET    Take 1 tablet (50 mg total) by mouth every 6 (six) hours as needed for severe pain.  Modified Medications   No  medications on file  Discontinued Medications  MULTIPLE VITAMINS-MINERALS (PRESERVISION/LUTEIN) CAPS    Take 1 capsule by mouth 2 (two) times daily.    OXYBUTYNIN (DITROPAN) 5 MG TABLET    TAKE 1 TABLET BY MOUTH TWICE DAILY.

## 2018-02-05 DIAGNOSIS — Z87891 Personal history of nicotine dependence: Secondary | ICD-10-CM | POA: Diagnosis not present

## 2018-02-05 DIAGNOSIS — L89104 Pressure ulcer of unspecified part of back, stage 4: Secondary | ICD-10-CM | POA: Diagnosis not present

## 2018-02-05 DIAGNOSIS — I2510 Atherosclerotic heart disease of native coronary artery without angina pectoris: Secondary | ICD-10-CM | POA: Diagnosis not present

## 2018-02-05 DIAGNOSIS — L89103 Pressure ulcer of unspecified part of back, stage 3: Secondary | ICD-10-CM | POA: Diagnosis not present

## 2018-02-05 DIAGNOSIS — I10 Essential (primary) hypertension: Secondary | ICD-10-CM | POA: Diagnosis not present

## 2018-02-05 DIAGNOSIS — I739 Peripheral vascular disease, unspecified: Secondary | ICD-10-CM | POA: Diagnosis not present

## 2018-02-05 DIAGNOSIS — J449 Chronic obstructive pulmonary disease, unspecified: Secondary | ICD-10-CM | POA: Diagnosis not present

## 2018-02-05 NOTE — Telephone Encounter (Signed)
This note was placed in error. Nothing further needed.

## 2018-02-08 DIAGNOSIS — L89110 Pressure ulcer of right upper back, unstageable: Secondary | ICD-10-CM | POA: Diagnosis not present

## 2018-02-08 DIAGNOSIS — R1312 Dysphagia, oropharyngeal phase: Secondary | ICD-10-CM | POA: Diagnosis not present

## 2018-02-08 DIAGNOSIS — Z951 Presence of aortocoronary bypass graft: Secondary | ICD-10-CM | POA: Diagnosis not present

## 2018-02-08 DIAGNOSIS — Z8673 Personal history of transient ischemic attack (TIA), and cerebral infarction without residual deficits: Secondary | ICD-10-CM | POA: Diagnosis not present

## 2018-02-08 DIAGNOSIS — I255 Ischemic cardiomyopathy: Secondary | ICD-10-CM | POA: Diagnosis not present

## 2018-02-08 DIAGNOSIS — Z9981 Dependence on supplemental oxygen: Secondary | ICD-10-CM | POA: Diagnosis not present

## 2018-02-08 DIAGNOSIS — Z95810 Presence of automatic (implantable) cardiac defibrillator: Secondary | ICD-10-CM | POA: Diagnosis not present

## 2018-02-08 DIAGNOSIS — Z87891 Personal history of nicotine dependence: Secondary | ICD-10-CM | POA: Diagnosis not present

## 2018-02-08 DIAGNOSIS — I110 Hypertensive heart disease with heart failure: Secondary | ICD-10-CM | POA: Diagnosis not present

## 2018-02-08 DIAGNOSIS — K5790 Diverticulosis of intestine, part unspecified, without perforation or abscess without bleeding: Secondary | ICD-10-CM | POA: Diagnosis not present

## 2018-02-08 DIAGNOSIS — Z7951 Long term (current) use of inhaled steroids: Secondary | ICD-10-CM | POA: Diagnosis not present

## 2018-02-08 DIAGNOSIS — I5043 Acute on chronic combined systolic (congestive) and diastolic (congestive) heart failure: Secondary | ICD-10-CM | POA: Diagnosis not present

## 2018-02-08 DIAGNOSIS — E785 Hyperlipidemia, unspecified: Secondary | ICD-10-CM | POA: Diagnosis not present

## 2018-02-08 DIAGNOSIS — M1990 Unspecified osteoarthritis, unspecified site: Secondary | ICD-10-CM | POA: Diagnosis not present

## 2018-02-08 DIAGNOSIS — Z7902 Long term (current) use of antithrombotics/antiplatelets: Secondary | ICD-10-CM | POA: Diagnosis not present

## 2018-02-08 DIAGNOSIS — I2510 Atherosclerotic heart disease of native coronary artery without angina pectoris: Secondary | ICD-10-CM | POA: Diagnosis not present

## 2018-02-08 DIAGNOSIS — I739 Peripheral vascular disease, unspecified: Secondary | ICD-10-CM | POA: Diagnosis not present

## 2018-02-08 DIAGNOSIS — M810 Age-related osteoporosis without current pathological fracture: Secondary | ICD-10-CM | POA: Diagnosis not present

## 2018-02-08 DIAGNOSIS — J449 Chronic obstructive pulmonary disease, unspecified: Secondary | ICD-10-CM | POA: Diagnosis not present

## 2018-02-10 ENCOUNTER — Emergency Department (HOSPITAL_COMMUNITY): Payer: Medicare Other

## 2018-02-10 ENCOUNTER — Other Ambulatory Visit: Payer: Self-pay | Admitting: Internal Medicine

## 2018-02-10 ENCOUNTER — Telehealth: Payer: Self-pay | Admitting: Pulmonary Disease

## 2018-02-10 ENCOUNTER — Other Ambulatory Visit: Payer: Self-pay

## 2018-02-10 ENCOUNTER — Encounter (HOSPITAL_COMMUNITY): Payer: Self-pay | Admitting: Emergency Medicine

## 2018-02-10 ENCOUNTER — Emergency Department (HOSPITAL_COMMUNITY)
Admission: EM | Admit: 2018-02-10 | Discharge: 2018-02-10 | Disposition: A | Discharge status: Home / Self Care | Payer: Medicare Other | Attending: Emergency Medicine | Admitting: Emergency Medicine

## 2018-02-10 DIAGNOSIS — J690 Pneumonitis due to inhalation of food and vomit: Secondary | ICD-10-CM | POA: Insufficient documentation

## 2018-02-10 DIAGNOSIS — R0602 Shortness of breath: Secondary | ICD-10-CM | POA: Diagnosis not present

## 2018-02-10 DIAGNOSIS — R05 Cough: Secondary | ICD-10-CM | POA: Diagnosis not present

## 2018-02-10 DIAGNOSIS — Z79899 Other long term (current) drug therapy: Secondary | ICD-10-CM | POA: Diagnosis not present

## 2018-02-10 DIAGNOSIS — I2510 Atherosclerotic heart disease of native coronary artery without angina pectoris: Secondary | ICD-10-CM | POA: Diagnosis not present

## 2018-02-10 DIAGNOSIS — Y92009 Unspecified place in unspecified non-institutional (private) residence as the place of occurrence of the external cause: Secondary | ICD-10-CM | POA: Diagnosis not present

## 2018-02-10 DIAGNOSIS — W108XXA Fall (on) (from) other stairs and steps, initial encounter: Secondary | ICD-10-CM | POA: Diagnosis not present

## 2018-02-10 DIAGNOSIS — S0101XA Laceration without foreign body of scalp, initial encounter: Secondary | ICD-10-CM | POA: Diagnosis not present

## 2018-02-10 DIAGNOSIS — Y9389 Activity, other specified: Secondary | ICD-10-CM | POA: Insufficient documentation

## 2018-02-10 DIAGNOSIS — S0990XA Unspecified injury of head, initial encounter: Secondary | ICD-10-CM | POA: Diagnosis not present

## 2018-02-10 DIAGNOSIS — Z85828 Personal history of other malignant neoplasm of skin: Secondary | ICD-10-CM | POA: Diagnosis not present

## 2018-02-10 DIAGNOSIS — R531 Weakness: Secondary | ICD-10-CM | POA: Diagnosis not present

## 2018-02-10 DIAGNOSIS — I5042 Chronic combined systolic (congestive) and diastolic (congestive) heart failure: Secondary | ICD-10-CM | POA: Diagnosis not present

## 2018-02-10 DIAGNOSIS — Y999 Unspecified external cause status: Secondary | ICD-10-CM | POA: Diagnosis not present

## 2018-02-10 DIAGNOSIS — I110 Hypertensive heart disease with heart failure: Secondary | ICD-10-CM | POA: Insufficient documentation

## 2018-02-10 DIAGNOSIS — W19XXXA Unspecified fall, initial encounter: Secondary | ICD-10-CM

## 2018-02-10 DIAGNOSIS — Z87891 Personal history of nicotine dependence: Secondary | ICD-10-CM | POA: Diagnosis not present

## 2018-02-10 DIAGNOSIS — R51 Headache: Secondary | ICD-10-CM | POA: Diagnosis not present

## 2018-02-10 DIAGNOSIS — Z7901 Long term (current) use of anticoagulants: Secondary | ICD-10-CM | POA: Insufficient documentation

## 2018-02-10 LAB — COMPREHENSIVE METABOLIC PANEL
ALBUMIN: 3.4 g/dL — AB (ref 3.5–5.0)
ALT: 18 U/L (ref 17–63)
AST: 26 U/L (ref 15–41)
Alkaline Phosphatase: 88 U/L (ref 38–126)
Anion gap: 9 (ref 5–15)
BUN: 23 mg/dL — AB (ref 6–20)
CHLORIDE: 101 mmol/L (ref 101–111)
CO2: 24 mmol/L (ref 22–32)
Calcium: 8.9 mg/dL (ref 8.9–10.3)
Creatinine, Ser: 0.98 mg/dL (ref 0.61–1.24)
GFR calc Af Amer: >60 mL/min (ref >60)
GFR calc non Af Amer: >60 mL/min (ref >60)
GLUCOSE: 88 mg/dL (ref 65–99)
Potassium: 4 mmol/L (ref 3.5–5.1)
SODIUM: 134 mmol/L — AB (ref 135–145)
Total Bilirubin: 0.5 mg/dL (ref 0.3–1.2)
Total Protein: 6.7 g/dL (ref 6.5–8.1)

## 2018-02-10 LAB — CBC WITH DIFFERENTIAL/PLATELET
Basophils Absolute: 0 10*3/uL (ref 0.0–0.1)
Basophils Relative: 0 %
EOS PCT: 29 %
Eosinophils Absolute: 2.6 10*3/uL — ABNORMAL HIGH (ref 0.0–0.7)
HEMATOCRIT: 39.4 % (ref 39.0–52.0)
HEMOGLOBIN: 13.2 g/dL (ref 13.0–17.0)
LYMPHS ABS: 1.9 10*3/uL (ref 0.7–4.0)
Lymphocytes Relative: 21 %
MCH: 32.5 pg (ref 26.0–34.0)
MCHC: 33.5 g/dL (ref 30.0–36.0)
MCV: 97 fL (ref 78.0–100.0)
MONOS PCT: 6 %
Monocytes Absolute: 0.5 10*3/uL (ref 0.1–1.0)
NEUTROS ABS: 4 10*3/uL (ref 1.7–7.7)
Neutrophils Relative %: 44 %
Platelets: 132 10*3/uL — ABNORMAL LOW (ref 150–400)
RBC: 4.06 MIL/uL — ABNORMAL LOW (ref 4.22–5.81)
RDW: 14.1 % (ref 11.5–15.5)
WBC: 9 10*3/uL (ref 4.0–10.5)

## 2018-02-10 LAB — I-STAT TROPONIN, ED: Troponin i, poc: 0.01 ng/mL (ref 0.00–0.08)

## 2018-02-10 MED ORDER — AMOXICILLIN-POT CLAVULANATE 875-125 MG PO TABS: 1.0000 | ORAL_TABLET | Freq: Two times a day (BID) | ORAL | 0 refills | Status: DC

## 2018-02-10 MED ORDER — TETANUS-DIPHTH-ACELL PERTUSSIS 5-2.5-18.5 LF-MCG/0.5 IM SUSP
0.5000 mL | Freq: Once | INTRAMUSCULAR | Status: AC
  Administered 2018-02-10: 0.5 mL via INTRAMUSCULAR
  Filled 2018-02-10: qty 0.5

## 2018-02-10 MED ORDER — DOXYCYCLINE HYCLATE 100 MG PO CAPS: 100.0000 mg | ORAL_CAPSULE | Freq: Two times a day (BID) | ORAL | 0 refills | Status: DC

## 2018-02-10 MED ORDER — IOPAMIDOL (ISOVUE-370) INJECTION 76%
INTRAVENOUS | Status: AC
  Administered 2018-02-10: 100 mL
  Filled 2018-02-10: qty 100

## 2018-02-10 MED ORDER — LIDOCAINE-EPINEPHRINE (PF) 2 %-1:200000 IJ SOLN
10.0000 mL | Freq: Once | INTRAMUSCULAR | Status: AC
  Administered 2018-02-10: 10 mL
  Filled 2018-02-10: qty 20

## 2018-02-10 NOTE — ED Notes (Signed)
Pt and patient daughter now endorsing productive cough with brown/green sputum for 2 days, hx of pneumonia.

## 2018-02-10 NOTE — Discharge Instructions (Addendum)
Please take doxycycline for 7 days.  Please take Augmentin for 10 days.   You may have diarrhea from the antibiotics.  It is very important that you continue to take the antibiotics even if you get diarrhea unless a medical professional tells you that you may stop taking them.  If you stop too early the bacteria you are being treated for will become stronger and you may need different, more powerful antibiotics that have more side effects and worsening diarrhea.  Please stay well hydrated and consider probiotics as they may decrease the severity of your diarrhea.    Please take Tylenol (acetaminophen) to relieve your pain.  You may take tylenol, up to 1,000 mg (two extra strength pills).  Do not take more than 3,000 mg tylenol in a 24 hour period.  Please check all medication labels as many medications such as pain and cold medications may contain tylenol. Please do not drink alcohol while taking this medication.

## 2018-02-10 NOTE — Telephone Encounter (Signed)
Spoke with [employer]. She stated that the patient is currently in the ED for another fall. They did another CT chest and chest xray on the patient and they advised him that he has pneumonia.   Per [name], they were advised him to have him follow up with Dr. Lenna Gilford as soon as possible. [name] is only in town for a few days. She wants to know if there is anyway possible Dr. Lenna Gilford would be willing to see the patient tomorrow since he will not discharged today.   Dr. Lenna Gilford, please advise. Thanks!

## 2018-02-10 NOTE — ED Notes (Signed)
Pt verbalizes understanding of d/c instructions. Pt received prescriptions. Pt taken to lobby in wheelchair at d/c with all belongings.   

## 2018-02-10 NOTE — ED Provider Notes (Signed)
[facility] Provider Note   CSN: [id number] Arrival date & time: 02/10/18  9924     History   Chief Complaint Chief Complaint  Patient presents with  . Fall    HPI [name] is a 84 y.o. male with a history of CHF with reduced ejection fraction, COPD, CAD, AAA status post repair in 2002, AICD in place, who presents today for evaluation of a fall.  He reports that he was going down the single step in his house when his right leg felt weak and gave out causing him to fall.  Patient is chronically anticoagulated.  He reports pain in the back of his head along with bleeding.  He denies any visual changes, states that he did not pass out, states that he feels like his legs are normal now.  He denies any chest pain or shortness of breath.  He does have a history of aspiration pneumonia due to noncompliance with his dysphagia diet.  He has recently been taking doxycycline for a superficial skin infection.  He reports that he has been coughing more than usual over the past few days.    He denies any injuries, other than striking the back of his head, from his fall.  Patient reports that his left lower leg is normally more swollen than his right.    HPI  Past Medical History:  Diagnosis Date  . AAA (abdominal aortic aneurysm) ([place])    a. 2002 s/p repair.  . Allergic rhinitis   . Back pain   . CAD (coronary artery disease)    a. 02/2002 H/o MI with stenting x 2; b. 04/2013 MV: EF 25%, large posterior lateral infarct w/o ischemia; c. 03/2016 VT Arrest/Cath: LM 60ost, LAD 40p/m ISR, LCX 100p/m, RCA nl-->Med Rx.  . Carotid arterial disease ([place])    a. 12/2010 s/p R CEA;  b. 05/2015 Carotid U/S: bilat <40% ICA stenosis.  . Chronic combined systolic and diastolic CHF (congestive heart failure) ([place])    a. 03/2016 Echo: Ef 35-40%, grade 1 DD.  Marland Kitchen Compression fracture   . COPD (chronic obstructive pulmonary disease) ([name])   . Dermatitis   . Diverticulosis of  colon   . DJD (degenerative joint disease)    and Gout  . Hemorrhoids   . History of colonic polyps   . History of gout   . History of pneumonia   . Hypercholesterolemia   . Hypertension   . Hypertensive heart disease   . Ischemic cardiomyopathy    a. EF prev <35%-->improved to normal by Echo 8/14:  Mild LVH, focal basal hypertrophy, EF 60-65%, normal wall motion, mild BAE, PASP 36; c. 03/2016 Echo: EF 35-40%, Gr1 DD, triv AI, mild MR, mod dil LA, mild-mod TR, PASP 69mmHg.  . Osteoporosis   . Peripheral vascular disease ([place])   . Presence of cardiac defibrillator    a. 03/2008 s/p MDT [id number] Maximo II DR, DC AICD; b. 03/2013: ICD shock for T wave oversensing;  c. 10/2015: collective decision not to replace ICD given improvement in LV fxn; d. VT Arrest-->Gen change to MDT ser # [id number] H.  . Skin cancer    shoulders and forehead  . Syncope   . T wave over sensing resulting in inappropriate shocks    a. 03/2013.  . Tobacco abuse   . Ventricular tachycardia ([place]) 04/01/2016    Patient Active Problem List   Diagnosis Date Noted  . Physical deconditioning 11/13/2017  . Weakness 11/12/2017  .  Falls 11/12/2017  . Pleural effusion   . Pressure injury of skin 10/01/2017  . Acute hypoxemic respiratory failure ([place]) 09/30/2017  . CHF exacerbation ([place]) 09/30/2017  . Shingles outbreak 05/27/2017  . Seborrheic dermatitis 05/27/2017  . Acute on chronic respiratory failure with hypoxia ([place]) 01/13/2017  . COPD with acute bronchitis ([place]) 01/13/2017  . CAD S/P percutaneous coronary angioplasty   . Chronic combined systolic and diastolic CHF (congestive heart failure) ([place])   . Blunt chest trauma 05/04/2016  . 4.1 cm Aneurysm of thoracic aorta ([place]) 04/27/2016  . SIADH (syndrome of inappropriate ADH production) ([place])   . Orthostatic hypotension 04/12/2016  . TBI (traumatic brain injury) ([place])   . Acute on chronic combined systolic and diastolic congestive heart failure ([place])   . S/P ORIF  (open reduction internal fixation) fracture   . Thrombocytopenia ([place])   . Urinary retention   . Slow transit constipation   . Thyroid activity decreased   . Traumatic subdural hematoma with loss of consciousness ([place])   . Pulmonary hypertension ([place])   . Cardiomyopathy, ischemic   . Subdural hematoma ([place])   . Cardiac arrest ([place]) 03/29/2016  . 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 CAP (community acquired pneumonia) 04/28/2015  . T wave over sensing resulting in inappropriate shocks 08/14/2013  . Vitamin D deficiency disease 04/09/2012  . Actinic skin damage 05/22/2011  . Alcohol use 03/22/2011  . Other dysphagia 02/16/2011  . Carotid arterial disease ([place]) 11/17/2009  . GOUT 09/23/2009  . SYNCOPE 04/19/2009  . Closed fracture of bone 07/27/2008  . Automatic implantable cardioverter-defibrillator in situ 04/13/2008  . COLONIC POLYPS 12/09/2007  . DIVERTICULOSIS OF COLON 12/09/2007  . Essential hypertension 12/08/2007  . Peripheral vascular disease ([place]) 12/08/2007  . COPD GOLD III 12/08/2007  . Osteoarthritis 12/08/2007    Past Surgical History:  Procedure Laterality Date  . ABDOMINAL AORTIC ANEURYSM REPAIR  2002   by Dr. Kellie Simmering  . aicd placed  03/2008   Dr. Caryl Comes  . cad stent  02/2002   Dr. Percival Spanish  . CARDIAC CATHETERIZATION     X 2 stents  . CARDIAC CATHETERIZATION N/A 04/03/2016   Procedure: Left Heart Cath and Coronary Angiography;  Surgeon: Belva Crome, MD;  Location: [facility];  Service: Cardiovascular;  Laterality: N/A;  . CARDIAC DEFIBRILLATOR PLACEMENT  2009  . CARDIAC DEFIBRILLATOR PLACEMENT    . CAROTID ENDARTERECTOMY Right January 02, 2011  . EAR CYST EXCISION Left 12/13/2015   Procedure: Excision left ear lesion ;  Surgeon: Melissa Montane, MD;  Location: [facility];  Service: ENT;  Laterality: Left;  . EP IMPLANTABLE DEVICE N/A 04/06/2016   Procedure:  ICD Generator Changeout;  Surgeon: Deboraha Sprang, MD;  Location: [facility];   Service: Cardiovascular;  Laterality: N/A;  . EXCISION OF LESION LEFT EAR Left 12/13/2015  . I&D EXTREMITY Left 04/23/2016   Procedure: IRRIGATION AND DEBRIDEMENT HAND;  Surgeon: Leandrew Koyanagi, MD;  Location: [facility];  Service: Orthopedics;  Laterality: Left;  . IR IMAGE GUIDED DRAINAGE BY PERCUTANEOUS CATHETER  10/08/2017  . OPEN REDUCTION INTERNAL FIXATION (ORIF) METACARPAL Left 04/04/2016   Procedure: OPEN REDUCTION INTERNAL FIXATION (ORIF) LEFT 2ND, 3RD, 4TH METACARPAL FRACTURE;  Surgeon: Leandrew Koyanagi, MD;  Location: [facility];  Service: Orthopedics;  Laterality: Left;  OPEN REDUCTION INTERNAL FIXATION (ORIF) LEFT 2ND, 3RD, 4TH METACARPAL FRACTURE  . OPEN REDUCTION INTERNAL FIXATION (ORIF) METACARPAL Left 04/23/2016   Procedure: REVISION OPEN  REDUCTION INTERNAL FIXATION (ORIF) 2ND METACARPAL;  Surgeon: Leandrew Koyanagi, MD;  Location: [facility];  Service: Orthopedics;  Laterality: Left;  . right corotid enderectomy  12/2010   Dr. Scot Dock  . SKIN SPLIT GRAFT Left 12/13/2015   Procedure: with possible skin graft;  Surgeon: Melissa Montane, MD;  Location: [facility];  Service: ENT;  Laterality: Left;  . TONSILLECTOMY          Home Medications    Prior to Admission medications   Medication Sig Start Date End Date Taking? Authorizing Provider  albuterol (PROVENTIL HFA;VENTOLIN HFA) 108 (90 Base) MCG/ACT inhaler Inhale 2 puffs into the lungs every 6 (six) hours as needed for wheezing or shortness of breath. 01/14/17  Yes Rizwan, Eunice Blase, MD  allopurinol (ZYLOPRIM) 100 MG tablet TAKE 1 TABLET ONCE DAILY. Patient taking differently: TAKE 1 TABLET (100mg ) ONCE DAILY. 01/06/18  Yes Noralee Space, MD  amiodarone (PACERONE) 200 MG tablet TAKE 1 TABLET ONCE DAILY. Patient taking differently: TAKE 1 TABLET (200mg ) ONCE DAILY. 01/20/18  Yes Noralee Space, MD  carvedilol (COREG) 3.125 MG tablet TAKE 1 TABLET TWICE DAILY WITH A MEAL. Patient taking differently: TAKE 1 TABLET (3.125mg ) TWICE DAILY WITH A MEAL AS NEEDED 06/10/17  Yes  Minus Breeding, MD  cholecalciferol (VITAMIN D) 1000 units tablet Take 1,000 Units by mouth daily.   Yes [provider]  clopidogrel (PLAVIX) 75 MG tablet TAKE 1 TABLET ONCE DAILY. Patient taking differently: TAKE 1 TABLET (75mg ) ONCE DAILY. 06/25/17  Yes Noralee Space, MD  fluticasone-salmeterol (ADVAIR HFA) [phone] MCG/ACT inhaler Inhale 2 puffs into the lungs 2 (two) times daily. 01/14/18  Yes Noralee Space, MD  guaiFENesin (MUCINEX) 600 MG 12 hr tablet Take 600-1,200 mg by mouth 2 (two) times daily. Takes two in the morning and one in the evening.   Yes [provider]  levothyroxine (SYNTHROID, LEVOTHROID) 50 MCG tablet Take 50 mcg by mouth daily before breakfast.   Yes [provider]  magnesium oxide (MAG-OX) 400 (241.3 Mg) MG tablet Take 0.5 tablets (200 mg total) by mouth daily. 04/27/16  Yes Love, Ivan Anchors, PA-C  midodrine (PROAMATINE) 2.5 MG tablet Take 1 tablet (2.5 mg total) by mouth 3 (three) times daily with meals. 11/13/17  Yes Kilroy, Doreene Burke, PA-C  Multiple Vitamin (MULTIVITAMIN) capsule Take 1 capsule by mouth daily.   Yes [provider]  oxybutynin (DITROPAN) 5 MG tablet TAKE 1 TABLET BY MOUTH TWICE DAILY. 01/27/18  Yes Noralee Space, MD  potassium chloride (K-DUR) 10 MEQ tablet Take 1 tablet Monday, Wednesday and Friday Patient taking differently: Take 10 mEq by mouth See admin instructions. Take 1 tablet Monday, Wednesday and Friday 11/13/17  Yes Kilroy, Lurena Joiner K, PA-C  pravastatin (PRAVACHOL) 40 MG tablet TAKE 1 TABLET ONCE DAILY. 01/20/18  Yes Noralee Space, MD  sertraline (ZOLOFT) 50 MG tablet TAKE ONE TABLET AT BEDTIME. 10/24/17  Yes Noralee Space, MD  thiamine 100 MG tablet Take 1 tablet (100 mg total) by mouth daily. 04/26/16  Yes Love, Ivan Anchors, PA-C  alendronate (FOSAMAX) 70 MG tablet Take 1 tablet (70 mg total) by mouth once a week. Take with a full glass of water on an empty stomach. Patient not taking: Reported on 02/10/2018 07/10/17   Noralee Space, MD  amoxicillin-clavulanate (AUGMENTIN) 875-125 MG tablet Take 1 tablet by mouth every 12 (twelve) hours. 02/10/18   Lorin Glass, PA-C  doxycycline (VIBRAMYCIN) 100 MG capsule Take 1 capsule (100  mg total) by mouth 2 (two) times daily for 7 days. 02/10/18 02/17/18  Lorin Glass, PA-C  fluconazole (DIFLUCAN) 100 MG tablet 2 tablets first day followed by one daily until finished. Take QHS Patient not taking: Reported on 02/10/2018 11/25/17   Noralee Space, MD  Fluticasone-Salmeterol [phone] MCG/ACT AEPB INHALE 2 PUFFS TWICE DAILY 01/13/18   Noralee Space, MD  furosemide (LASIX) 20 MG tablet Take 1 tablet Monday, Wednesday and Friday Patient taking differently: Take 20 mg by mouth See admin instructions. Take 1 tablet Monday, Wednesday and Friday 11/13/17   Erlene Quan, PA-C  Maltodextrin-Xanthan Gum (RESOURCE THICKENUP CLEAR) POWD Take 120 g by mouth as needed. Patient not taking: Reported on 02/10/2018 10/17/17   Robbie Lis, MD  polyethylene glycol powder (QC NATURA-LAX) powder DISSOLVE ONE CAPFUL IN 8 0Z. WATER TWICE DAILY. Patient taking differently: Take 1 Container by mouth See admin instructions. DISSOLVE ONE CAPFUL IN 8 0Z. WATER EVERY OTHER DAY 01/22/18   Noralee Space, MD  traMADol (ULTRAM) 50 MG tablet Take 1 tablet (50 mg total) by mouth every 6 (six) hours as needed for severe pain. Patient not taking: Reported on 02/10/2018 10/14/17   Robbie Lis, MD    Family History Family History  Problem Relation Age of Onset  . Hypertension Father   . Heart disease Father        Heart Disease before age 12  . Hypertension Mother   . Heart disease Mother        Heart Disease before age 29  . Cancer Mother     Social History Social History   Tobacco Use  . Smoking status: Former Smoker    Types: Cigarettes, Cigars    Last attempt to quit: 10/22/2012    Years since quitting: 5.3  . Smokeless tobacco: Never Used  . Tobacco comment: quit about 10 years ago    Substance Use Topics  . Alcohol use: Yes    Alcohol/week: 0.0 oz    Types: 3 Glasses of wine per week    Comment: weekly  . Drug use: No     Allergies   Lisinopril   Review of Systems Review of Systems  Constitutional: Negative for chills and fever.  HENT: Negative for congestion, nosebleeds and postnasal drip.   Eyes: Negative for visual disturbance.  Respiratory: Positive for cough. Negative for chest tightness and shortness of breath.   Cardiovascular: Negative for chest pain and palpitations.  Gastrointestinal: Negative for abdominal pain, nausea and vomiting.  Genitourinary: Negative for dysuria.  Musculoskeletal: Negative for arthralgias, joint swelling, neck pain and neck stiffness.  Skin: Negative for rash.  Neurological: Positive for weakness (Generalized). Negative for headaches.  Psychiatric/Behavioral: The patient is not nervous/anxious.   All other systems reviewed and are negative.    Physical Exam Updated Vital Signs BP (!) 147/72 (BP Location: Left Arm)   Pulse 61   Temp 98 F (36.7 C) (Oral)   Resp 18   Ht 5\' 11"  (1.803 m)   Wt 75.3 kg (166 lb)   SpO2 96%   BMI 23.15 kg/m   Physical Exam  Constitutional: He is oriented to person, place, and time.  Chronically ill appearing  HENT:  Head: Normocephalic.  On the occipital scalp there is a L shaped laceration that was approx 4x3cm.  Flap is freely mobile.  Minimal bleeding.   Eyes: Pupils are equal, round, and reactive to light. Conjunctivae and EOM are normal.  Neck: Normal range  of motion. Neck supple.  No midline or lateral C-spine tenderness to palpation.  No crepitus or deformities palpated.   Cardiovascular: Normal rate, regular rhythm, normal heart sounds and intact distal pulses. Exam reveals no friction rub.  No murmur heard. Pulmonary/Chest: Effort normal and breath sounds normal. No respiratory distress. He has no wheezes. He has no rales. He exhibits no tenderness.  Abdominal: Soft.  Bowel sounds are normal. He exhibits no distension. There is no tenderness. There is no guarding.  Musculoskeletal: He exhibits no edema.  5/5 strength in bilateral upper and lower extremities. No crepitus, deformities or localized TTP.  All compartments are soft and easily compressible.   Slight swelling in left lower leg.   Neurological: He is alert and oriented to person, place, and time. No sensory deficit.  Skin: Skin is warm and dry. He is not diaphoretic.  Psychiatric: He has a normal mood and affect.  Nursing note and vitals reviewed.    ED Treatments / Results  Labs (all labs ordered are listed, but only abnormal results are displayed) Labs Reviewed  COMPREHENSIVE METABOLIC PANEL - Abnormal; Notable for the following components:      Result Value   Sodium 134 (*)    BUN 23 (*)    Albumin 3.4 (*)    All other components within normal limits  CBC WITH DIFFERENTIAL/PLATELET - Abnormal; Notable for the following components:   RBC 4.06 (*)    Platelets 132 (*)    Eosinophils Absolute 2.6 (*)    All other components within normal limits  I-STAT TROPONIN, ED    EKG EKG Interpretation  Date/Time:  Monday February 10 2018 09:55:17 EDT Ventricular Rate:  60 PR Interval:    QRS Duration: 168 QT Interval:  529 QTC Calculation: 529 R Axis:   43 Text Interpretation:  Atrial-paced rhythm IVCD, consider atypical RBBB Inferior infarct, old Baseline wander in lead(s) V5 V6 Confirmed by Milton Ferguson [phone]) on 02/10/2018 10:02:03 AM   Radiology Dg Chest 2 View  Result Date: 02/10/2018 CLINICAL DATA:  Productive cough and shortness of breath for the past 2 days. Former smoker. Previous episodes of pneumonia. EXAM: CHEST - 2 VIEW COMPARISON:  None in PACs FINDINGS: The lungs are mildly hyperinflated with hemidiaphragm flattening. There is no focal infiltrate. The interstitial markings are coarse in the right upper lobe and right mid to lower lung. The cardiac silhouette is top-normal  in size. The pulmonary vascularity is normal. There is tortuosity of the descending thoracic aorta with mural calcification in the arch and descending aorta. There is wedge compression of the body of T12 which is been treated with kyphoplasty. The ICD is in reasonable position. IMPRESSION: COPD with pleuroparenchymal changes which may reflect scarring but without previous studies 1 cannot exclude acute pneumonia. No overt CHF. Followup PA and lateral chest X-ray is recommended in 3-4 weeks following trial of antibiotic therapy to ensure resolution and exclude underlying malignancy. Thoracic aortic atherosclerosis. Electronically Signed   By: David  Martinique M.D.   On: 02/10/2018 09:24   Ct Head Wo Contrast  Result Date: 02/10/2018 CLINICAL DATA:  Pain following fall EXAM: CT HEAD WITHOUT CONTRAST TECHNIQUE: Contiguous axial images were obtained from the base of the skull through the vertex without intravenous contrast. COMPARISON:  None. FINDINGS: Brain: There is moderate diffuse atrophy. There is no intracranial mass, hemorrhage, extra-axial fluid collection, or midline shift. There is patchy small vessel disease throughout the centra semiovale bilaterally. There is no demonstrable acute infarct. Vascular: No  hyperdense vessel. There is calcification in both distal vertebral arteries as well as in the carotid siphon regions bilaterally. Skull: Bony calvarium appears intact. Sinuses/Orbits: There is opacification of much of the right maxillary antrum as well as throughout multiple ethmoid air cells on the right. There is opacification in the inferior right frontal sinus region. There is mild mucosal thickening in several left-sided ethmoid air cells. There appears to be bony expansion in several areas of the ethmoid air cell complex on the right. There is leftward deviation of the nasal septum. Visualized orbits appear symmetric bilaterally. Other: Mastoid air cells are clear. IMPRESSION: Atrophy with patchy  periventricular small vessel disease. No acute infarct evident. No mass or hemorrhage. Multifocal arterial vascular calcification. Multifocal paranasal sinus disease. Areas of apparent bony expansion in several ethmoid air cells on the right, likely due to chronic polypoid change. Deviated nasal septum toward the left. Electronically Signed   By: Lowella Grip III M.D.   On: 02/10/2018 09:33   Ct Angio Chest Pe W/cm &/or Wo Cm  Result Date: 02/10/2018 CLINICAL DATA:  Shortness of breath EXAM: CT ANGIOGRAPHY CHEST WITH CONTRAST TECHNIQUE: Multidetector CT imaging of the chest was performed using the standard protocol during bolus administration of intravenous contrast. Multiplanar CT image reconstructions and MIPs were obtained to evaluate the vascular anatomy. CONTRAST:  162mL ISOVUE-370 IOPAMIDOL (ISOVUE-370) INJECTION 76% COMPARISON:  Chest radiograph February 10, 2018 FINDINGS: Cardiovascular: There is no demonstrable pulmonary embolus. The ascending thoracic aortic diameter is 4.3 x 4.1 cm. No dissection is evident on this study. It should noted that the contrast bolus is suboptimal for exclusion radiographically thoracic dissection. There is extensive calcification proximal visualized great vessels. There is also calcification in the aorta as well as calcification in multiple coronary artery branches. Pacemaker lead tips are attached to the right atrium and right ventricle. No pericardial effusion or pericardial thickening evident. Main pulmonary outflow tract measures 3.3 cm, abnormally prominent. Mediastinum/Nodes: Thyroid appears unremarkable. There are scattered subcentimeter lymph nodes. There is no frank adenopathy by size criteria. No esophageal lesions are demonstrable. Lungs/Pleura: There is underlying centrilobular emphysematous change. There is a free-flowing pleural effusion on the right, fairly small. There is lower lobe atelectatic change bilaterally. There is consolidation in a portion of  the posterior segment right lower lobe which is suspected to represent pneumonia superimposed atelectasis. Upper Abdomen: In the visualized upper abdomen, there is reflux of contrast into the inferior vena cava. There is atherosclerotic calcification in the aorta and major mesenteric arterial vessels. Visualized upper abdominal structures otherwise appear normal. Musculoskeletal: The patient has had a previous kyphoplasty procedure at T12. There is anterior wedging of the T10, T12, and L2 vertebral bodies. There are lucent areas within the T11 and L1 vertebral bodies. There are old healed rib fractures on the right and left sides. Review of the MIP images confirms the above findings. IMPRESSION: 1.  No demonstrable pulmonary embolus. 2. Prominence of the ascending thoracic aorta measuring 4.3 x 4.1 cm. No dissection seen. It should be noted that the contrast bolus is not sufficient for confident exclusion of dissection from a radiographic standpoint. Recommend annual imaging followup by CTA or MRA. This recommendation follows 2010 ACCF/AHA/AATS/ACR/ASA/SCA/SCAI/SIR/STS/SVM Guidelines for the Diagnosis and Management of Patients with Thoracic Aortic Disease. Circulation. 2010; 121: Z610-R604. Note that there is extensive arterial vascular calcification in the aorta as well as extensive coronary artery and great vessel calcification. 3. Fractures in the lower thoracic and upper lumbar region. There are lucent appearing  areas in the T11 and L1 vertebral bodies. Possibility of metastatic disease in these areas cannot be excluded by current examination. 4. Small right pleural effusion. Bibasilar atelectasis. Probable pneumonia in a portion of the posterior segment of the right lower lobe. There is underlying centrilobular emphysematous change. There is a small right pleural effusion. 5. Prominence of the main pulmonary outflow tract, a finding likely indicative of underlying pulmonary arterial hypertension. 6.  No  evident adenopathy. 7. Reflux of contrast into the inferior vena cava, a finding that potentially may represent a degree of increased right heart pressure. 8.  Pacemaker leads attached right atrium and right ventricle. Aortic Atherosclerosis (ICD10-I70.0) and Emphysema (ICD10-J43.9). Electronically Signed   By: Lowella Grip III M.D.   On: 02/10/2018 13:02    Procedures .Marland KitchenLaceration Repair Date/Time: 02/10/2018 5:22 PM Performed by: Lorin Glass, PA-C Authorized by: Lorin Glass, PA-C   Consent:    Consent obtained:  Verbal   Consent given by:  Patient   Risks discussed:  Infection, need for additional repair, poor cosmetic result, pain, retained foreign body, tendon damage, vascular damage, poor wound healing and nerve damage   Alternatives discussed:  No treatment and referral (Alternative wound closures) Anesthesia (see MAR for exact dosages):    Anesthesia method:  Local infiltration   Local anesthetic:  Lidocaine 2% WITH epi Laceration details:    Location:  Scalp   Scalp location:  Occipital   Length (cm):  7 Repair type:    Repair type:  Simple Pre-procedure details:    Preparation:  Imaging obtained to evaluate for foreign bodies Exploration:    Hemostasis achieved with:  Epinephrine and direct pressure   Wound exploration: wound explored through full range of motion and entire depth of wound probed and visualized     Wound extent: no underlying fracture noted   Treatment:    Area cleansed with:  Saline   Amount of cleaning:  Standard   Irrigation solution:  Sterile saline   Irrigation volume:  755ml Skin repair:    Repair method:  Staples   Number of staples:  9 Post-procedure details:    Dressing:  Open (no dressing)   Patient tolerance of procedure:  Tolerated well, no immediate complications   (including critical care time)  Medications Ordered in ED Medications  lidocaine-EPINEPHrine (XYLOCAINE W/EPI) 2 %-1:200000 (PF) injection 10 mL (10  mLs Infiltration Given 02/10/18 1031)  iopamidol (ISOVUE-370) 76 % injection (100 mLs  Contrast Given 02/10/18 1229)  Tdap (BOOSTRIX) injection 0.5 mL (0.5 mLs Intramuscular Given 02/10/18 1523)     Initial Impression / Assessment and Plan / ED Course  I have reviewed the triage vital signs and the nursing notes.  Pertinent labs & imaging results that were available during my care of the patient were reviewed by me and considered in my medical decision making (see chart for details).    [name] presents today for evaluation of a head laceration after a fall.  CT head obtained without acute abnormalities.  Chest x-ray obtained with possible pneumonia.  Based on questionable swelling in left lower leg, along with probable pneumonia on chest x-ray CT angios PE study was performed.  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  CT does have evidence of pneumonia.  Patient is currently taking doxycycline, based on his history of aspiration pneumonia  will instruct to continue doxycycline and add Augmentin.  Patient was not hypoxic on room air, no new oxygen requirement.  He had multiple incidental findings including concern for mets, concern for elevated right heart pressures, and small right pleural effusion.  He was informed of all of these results and need to follow up with PCP.  Laceration was repaired with staples, removal in 10 days, Tdap Updated.  Patient did not meet sirs or sepsis critera. Patient does not have chest pain, shortness of breath or feelings of palpitations, Troponin and EKG with out acute changes.    This patient was seen as a shared visit with Dr. Roderic Palau.  After discussions with patient and his daughter will discharge patient home with outpatient trial.  He has close follow up with PCP (pulm) in the next 1-2 days according to daughter.  Strict return  precautions were discussed with patient and daughter who states understanding.    Final Clinical Impressions(s) / ED Diagnoses   Final diagnoses:  Fall, initial encounter  Laceration of scalp, initial encounter  Aspiration pneumonia of right lung, unspecified aspiration pneumonia type, unspecified part of lung [facility])    ED Discharge Orders        Ordered    amoxicillin-clavulanate (AUGMENTIN) 875-125 MG tablet  Every 12 hours     02/10/18 1504    doxycycline (VIBRAMYCIN) 100 MG capsule  2 times daily     02/10/18 1504       Lorin Glass, [place] 02/10/18 1731    Milton Ferguson, MD 02/11/18 [phone]

## 2018-02-10 NOTE — ED Triage Notes (Signed)
Pt to ER for evaluation of mechanical fall, daughter reports frequent falls recently. Pt daughter reports on anticoagulation.

## 2018-02-10 NOTE — Telephone Encounter (Signed)
Spoke with [name] about today's fall and ED visit.  I informed her that SN was aware of today's fall and ED treatment.  She stated he was back at home and resting.  The ED had told her to follow up with SN ASAP and she was wanting to schedule appointment.  Patient was placed on Doxycycline 7 days and Augmentin 10 days.  SN wanted to wait 7-10 days for follow up, since antibiotics started.  [name] stated understanding. Scheduled fall follow up appointment for 02/17/18 at 11:00. Nothing further needed at this time.

## 2018-02-12 DIAGNOSIS — I5043 Acute on chronic combined systolic (congestive) and diastolic (congestive) heart failure: Secondary | ICD-10-CM | POA: Diagnosis not present

## 2018-02-12 DIAGNOSIS — I110 Hypertensive heart disease with heart failure: Secondary | ICD-10-CM | POA: Diagnosis not present

## 2018-02-12 DIAGNOSIS — L89103 Pressure ulcer of unspecified part of back, stage 3: Secondary | ICD-10-CM | POA: Diagnosis not present

## 2018-02-12 DIAGNOSIS — K5790 Diverticulosis of intestine, part unspecified, without perforation or abscess without bleeding: Secondary | ICD-10-CM | POA: Diagnosis not present

## 2018-02-12 DIAGNOSIS — J449 Chronic obstructive pulmonary disease, unspecified: Secondary | ICD-10-CM | POA: Diagnosis not present

## 2018-02-12 DIAGNOSIS — L89110 Pressure ulcer of right upper back, unstageable: Secondary | ICD-10-CM | POA: Diagnosis not present

## 2018-02-12 DIAGNOSIS — I2510 Atherosclerotic heart disease of native coronary artery without angina pectoris: Secondary | ICD-10-CM | POA: Diagnosis not present

## 2018-02-12 DIAGNOSIS — I739 Peripheral vascular disease, unspecified: Secondary | ICD-10-CM | POA: Diagnosis not present

## 2018-02-12 DIAGNOSIS — L98422 Non-pressure chronic ulcer of back with fat layer exposed: Secondary | ICD-10-CM | POA: Diagnosis not present

## 2018-02-12 DIAGNOSIS — Z87891 Personal history of nicotine dependence: Secondary | ICD-10-CM | POA: Diagnosis not present

## 2018-02-12 DIAGNOSIS — I255 Ischemic cardiomyopathy: Secondary | ICD-10-CM | POA: Diagnosis not present

## 2018-02-12 DIAGNOSIS — I10 Essential (primary) hypertension: Secondary | ICD-10-CM | POA: Diagnosis not present

## 2018-02-13 ENCOUNTER — Other Ambulatory Visit (INDEPENDENT_AMBULATORY_CARE_PROVIDER_SITE_OTHER): Payer: Self-pay | Admitting: *Deleted

## 2018-02-13 ENCOUNTER — Telehealth: Payer: Self-pay | Admitting: Pulmonary Disease

## 2018-02-13 ENCOUNTER — Encounter: Payer: Self-pay | Admitting: Rheumatology

## 2018-02-13 ENCOUNTER — Ambulatory Visit (INDEPENDENT_AMBULATORY_CARE_PROVIDER_SITE_OTHER): Payer: Medicare Other | Admitting: Rheumatology

## 2018-02-13 VITALS — BP 111/65 | HR 61 | Resp 17 | Ht 71.0 in | Wt 166.0 lb

## 2018-02-13 DIAGNOSIS — I5043 Acute on chronic combined systolic (congestive) and diastolic (congestive) heart failure: Secondary | ICD-10-CM | POA: Diagnosis not present

## 2018-02-13 DIAGNOSIS — I255 Ischemic cardiomyopathy: Secondary | ICD-10-CM | POA: Diagnosis not present

## 2018-02-13 DIAGNOSIS — C44219 Basal cell carcinoma of skin of left ear and external auricular canal: Secondary | ICD-10-CM | POA: Diagnosis not present

## 2018-02-13 DIAGNOSIS — Z8679 Personal history of other diseases of the circulatory system: Secondary | ICD-10-CM

## 2018-02-13 DIAGNOSIS — J449 Chronic obstructive pulmonary disease, unspecified: Secondary | ICD-10-CM | POA: Diagnosis not present

## 2018-02-13 DIAGNOSIS — I739 Peripheral vascular disease, unspecified: Secondary | ICD-10-CM | POA: Diagnosis not present

## 2018-02-13 DIAGNOSIS — Z8719 Personal history of other diseases of the digestive system: Secondary | ICD-10-CM

## 2018-02-13 DIAGNOSIS — M4850XA Collapsed vertebra, not elsewhere classified, site unspecified, initial encounter for fracture: Secondary | ICD-10-CM

## 2018-02-13 DIAGNOSIS — E222 Syndrome of inappropriate secretion of antidiuretic hormone: Secondary | ICD-10-CM

## 2018-02-13 DIAGNOSIS — S32030S Wedge compression fracture of third lumbar vertebra, sequela: Secondary | ICD-10-CM

## 2018-02-13 DIAGNOSIS — Z87820 Personal history of traumatic brain injury: Secondary | ICD-10-CM

## 2018-02-13 DIAGNOSIS — K5790 Diverticulosis of intestine, part unspecified, without perforation or abscess without bleeding: Secondary | ICD-10-CM | POA: Diagnosis not present

## 2018-02-13 DIAGNOSIS — I110 Hypertensive heart disease with heart failure: Secondary | ICD-10-CM | POA: Diagnosis not present

## 2018-02-13 DIAGNOSIS — E559 Vitamin D deficiency, unspecified: Secondary | ICD-10-CM

## 2018-02-13 DIAGNOSIS — I2510 Atherosclerotic heart disease of native coronary artery without angina pectoris: Secondary | ICD-10-CM | POA: Diagnosis not present

## 2018-02-13 DIAGNOSIS — I10 Essential (primary) hypertension: Secondary | ICD-10-CM | POA: Diagnosis not present

## 2018-02-13 DIAGNOSIS — M8000XD Age-related osteoporosis with current pathological fracture, unspecified site, subsequent encounter for fracture with routine healing: Secondary | ICD-10-CM | POA: Diagnosis not present

## 2018-02-13 DIAGNOSIS — L89110 Pressure ulcer of right upper back, unstageable: Secondary | ICD-10-CM | POA: Diagnosis not present

## 2018-02-13 LAB — COMPLETE METABOLIC PANEL WITH GFR
AG RATIO: 1.3 (calc) (ref 1.0–2.5)
ALBUMIN MSPROF: 3.9 g/dL (ref 3.6–5.1)
ALKALINE PHOSPHATASE (APISO): 100 U/L (ref 40–115)
ALT: 21 U/L (ref 9–46)
AST: 29 U/L (ref 10–35)
BILIRUBIN TOTAL: 0.4 mg/dL (ref 0.2–1.2)
BUN / CREAT RATIO: 21 (calc) (ref 6–22)
BUN: 24 mg/dL (ref 7–25)
CHLORIDE: 103 mmol/L (ref 98–110)
CO2: 32 mmol/L (ref 20–32)
Calcium: 9.5 mg/dL (ref 8.6–10.3)
Creat: 1.15 mg/dL — ABNORMAL HIGH (ref 0.70–1.11)
GFR, EST AFRICAN AMERICAN: 67 mL/min/{1.73_m2} (ref >=60)
GFR, Est Non African American: 58 mL/min/{1.73_m2} — ABNORMAL LOW (ref >=60)
GLOBULIN: 3.1 g/dL (ref 1.9–3.7)
Glucose, Bld: 80 mg/dL (ref 65–99)
Potassium: 5.1 mmol/L (ref 3.5–5.3)
SODIUM: 138 mmol/L (ref 135–146)
Total Protein: 7 g/dL (ref 6.1–8.1)

## 2018-02-13 LAB — CBC WITH DIFFERENTIAL/PLATELET
BASOS ABS: 54 {cells}/uL (ref 0–200)
Basophils Relative: 0.6 %
EOS ABS: 2313 {cells}/uL — AB (ref 15–500)
Eosinophils Relative: 25.7 %
HEMATOCRIT: 41.1 % (ref 38.5–50.0)
HEMOGLOBIN: 14 g/dL (ref 13.2–17.1)
LYMPHS ABS: 1584 {cells}/uL (ref 850–3900)
MCH: 32.4 pg (ref 27.0–33.0)
MCHC: 34.1 g/dL (ref 32.0–36.0)
MCV: 95.1 fL (ref 80.0–100.0)
MONOS PCT: 5.8 %
MPV: 10.5 fL (ref 7.5–12.5)
NEUTROS ABS: 4527 {cells}/uL (ref 1500–7800)
Neutrophils Relative %: 50.3 %
Platelets: 142 10*3/uL (ref 140–400)
RBC: 4.32 10*6/uL (ref 4.20–5.80)
RDW: 12.9 % (ref 11.0–15.0)
Total Lymphocyte: 17.6 %
WBC: 9 10*3/uL (ref 3.8–10.8)
WBCMIX: 522 {cells}/uL (ref 200–950)

## 2018-02-13 LAB — PHOSPHORUS: Phosphorus: 3.9 mg/dL (ref 2.1–4.3)

## 2018-02-13 NOTE — Telephone Encounter (Signed)
[name] returning call and can be reached @ [phone] calling a/b bone density test last done 81yrs ago by Dr Lenna Gilford says it has to be a [facility] that orders test can't be anyone outside [facility], please advise.[name]

## 2018-02-13 NOTE — Telephone Encounter (Signed)
Attempted to call Ivin Booty back, no answer, left message to call back.

## 2018-02-14 ENCOUNTER — Other Ambulatory Visit: Payer: Self-pay | Admitting: Pulmonary Disease

## 2018-02-14 DIAGNOSIS — M810 Age-related osteoporosis without current pathological fracture: Secondary | ICD-10-CM

## 2018-02-14 LAB — TSH: TSH: 7.65 m[IU]/L — AB (ref 0.40–4.50)

## 2018-02-14 LAB — PARATHYROID HORMONE, INTACT (NO CA): PTH: 45 pg/mL (ref 14–64)

## 2018-02-14 LAB — VITAMIN D 25 HYDROXY (VIT D DEFICIENCY, FRACTURES): VIT D 25 HYDROXY: 24 ng/mL — AB (ref 30–100)

## 2018-02-14 NOTE — Telephone Encounter (Signed)
Per SN- Ok for Bone density order to place by him. Dx osteoporosis and compression fractures. Order placed for bone density.  I left message on Sharon's voice mail at [phone], that SN was ok with order and that it had  been placed. Nothing further needed at this time.

## 2018-02-14 NOTE — Progress Notes (Signed)
bone

## 2018-02-14 NOTE — Telephone Encounter (Signed)
Dr. Lenna Gilford - please advise if you are okay with placing this order for the pt's bone density test. Thanks!

## 2018-02-14 NOTE — Progress Notes (Signed)
TSH is high.  Please forward labs to his PCP.  We will discuss results at follow-up visit.

## 2018-02-15 ENCOUNTER — Other Ambulatory Visit: Payer: Self-pay | Admitting: Pulmonary Disease

## 2018-02-17 ENCOUNTER — Encounter: Payer: Self-pay | Admitting: Pulmonary Disease

## 2018-02-17 ENCOUNTER — Ambulatory Visit (INDEPENDENT_AMBULATORY_CARE_PROVIDER_SITE_OTHER): Payer: Medicare Other | Admitting: Pulmonary Disease

## 2018-02-17 ENCOUNTER — Ambulatory Visit (INDEPENDENT_AMBULATORY_CARE_PROVIDER_SITE_OTHER)
Admission: RE | Admit: 2018-02-17 | Discharge: 2018-02-17 | Disposition: A | Discharge status: Home / Self Care | Payer: Medicare Other | Source: Ambulatory Visit | Attending: Pulmonary Disease | Admitting: Pulmonary Disease

## 2018-02-17 VITALS — BP 118/60 | HR 73 | Temp 97.7°F | Ht 71.0 in | Wt 168.4 lb

## 2018-02-17 DIAGNOSIS — I255 Ischemic cardiomyopathy: Secondary | ICD-10-CM

## 2018-02-17 DIAGNOSIS — W19XXXD Unspecified fall, subsequent encounter: Secondary | ICD-10-CM

## 2018-02-17 DIAGNOSIS — J189 Pneumonia, unspecified organism: Secondary | ICD-10-CM

## 2018-02-17 DIAGNOSIS — M150 Primary generalized (osteo)arthritis: Secondary | ICD-10-CM | POA: Diagnosis not present

## 2018-02-17 DIAGNOSIS — M810 Age-related osteoporosis without current pathological fracture: Secondary | ICD-10-CM

## 2018-02-17 DIAGNOSIS — J181 Lobar pneumonia, unspecified organism: Secondary | ICD-10-CM

## 2018-02-17 DIAGNOSIS — M159 Polyosteoarthritis, unspecified: Secondary | ICD-10-CM

## 2018-02-17 DIAGNOSIS — I2510 Atherosclerotic heart disease of native coronary artery without angina pectoris: Secondary | ICD-10-CM

## 2018-02-17 DIAGNOSIS — R5381 Other malaise: Secondary | ICD-10-CM | POA: Diagnosis not present

## 2018-02-17 DIAGNOSIS — Z95810 Presence of automatic (implantable) cardiac defibrillator: Secondary | ICD-10-CM | POA: Diagnosis not present

## 2018-02-17 DIAGNOSIS — Z9861 Coronary angioplasty status: Secondary | ICD-10-CM

## 2018-02-17 DIAGNOSIS — J90 Pleural effusion, not elsewhere classified: Secondary | ICD-10-CM | POA: Diagnosis not present

## 2018-02-17 DIAGNOSIS — I5042 Chronic combined systolic (congestive) and diastolic (congestive) heart failure: Secondary | ICD-10-CM | POA: Diagnosis not present

## 2018-02-17 DIAGNOSIS — I272 Pulmonary hypertension, unspecified: Secondary | ICD-10-CM | POA: Diagnosis not present

## 2018-02-17 DIAGNOSIS — J449 Chronic obstructive pulmonary disease, unspecified: Secondary | ICD-10-CM | POA: Diagnosis not present

## 2018-02-17 LAB — PROTEIN ELECTROPHORESIS, SERUM, WITH REFLEX
ALPHA 1: 0.3 g/dL (ref 0.2–0.3)
Albumin ELP: 4.1 g/dL (ref 3.8–4.8)
Alpha 2: 0.9 g/dL (ref 0.5–0.9)
Beta 2: 0.4 g/dL (ref 0.2–0.5)
Beta Globulin: 0.4 g/dL (ref 0.4–0.6)
Gamma Globulin: 1.3 g/dL (ref 0.8–1.7)
TOTAL PROTEIN: 7.3 g/dL (ref 6.1–8.1)
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
## 2018-02-17 NOTE — Patient Instructions (Addendum)
Today we updated your med list in our EPIC system...    Continue your current medications the same...  Elizebeth Koller out the antibiotics given in the ER on 02/10/18...  I will call you w/ result of the bone density test...  Keep your appt at the wound care clinic to have your head wound staples removed...  Continue your Oxygen at night, Advair inhaler twice daily & the Mucinex for the sputum...  Continue your same cardiac meds...  REMEMBER:  NO FALLING ALLOWED, please be careful with all activity about the house...  Call for any questions...  Let's plan to keep our prev scheduled f/u visit on 6/17.Marland KitchenMarland Kitchen

## 2018-02-18 DIAGNOSIS — I255 Ischemic cardiomyopathy: Secondary | ICD-10-CM | POA: Diagnosis not present

## 2018-02-18 DIAGNOSIS — K5790 Diverticulosis of intestine, part unspecified, without perforation or abscess without bleeding: Secondary | ICD-10-CM | POA: Diagnosis not present

## 2018-02-18 DIAGNOSIS — I110 Hypertensive heart disease with heart failure: Secondary | ICD-10-CM | POA: Diagnosis not present

## 2018-02-18 DIAGNOSIS — L89110 Pressure ulcer of right upper back, unstageable: Secondary | ICD-10-CM | POA: Diagnosis not present

## 2018-02-18 DIAGNOSIS — I2510 Atherosclerotic heart disease of native coronary artery without angina pectoris: Secondary | ICD-10-CM | POA: Diagnosis not present

## 2018-02-18 DIAGNOSIS — I5043 Acute on chronic combined systolic (congestive) and diastolic (congestive) heart failure: Secondary | ICD-10-CM | POA: Diagnosis not present

## 2018-02-19 ENCOUNTER — Encounter (HOSPITAL_BASED_OUTPATIENT_CLINIC_OR_DEPARTMENT_OTHER): Payer: Medicare Other | Attending: Physician Assistant

## 2018-02-19 DIAGNOSIS — I10 Essential (primary) hypertension: Secondary | ICD-10-CM | POA: Diagnosis not present

## 2018-02-19 DIAGNOSIS — Z87891 Personal history of nicotine dependence: Secondary | ICD-10-CM | POA: Insufficient documentation

## 2018-02-19 DIAGNOSIS — J449 Chronic obstructive pulmonary disease, unspecified: Secondary | ICD-10-CM | POA: Diagnosis not present

## 2018-02-19 DIAGNOSIS — Z955 Presence of coronary angioplasty implant and graft: Secondary | ICD-10-CM | POA: Insufficient documentation

## 2018-02-19 DIAGNOSIS — L89104 Pressure ulcer of unspecified part of back, stage 4: Secondary | ICD-10-CM | POA: Diagnosis not present

## 2018-02-19 DIAGNOSIS — L98422 Non-pressure chronic ulcer of back with fat layer exposed: Secondary | ICD-10-CM | POA: Diagnosis not present

## 2018-02-19 LAB — IFE INTERPRETATION: Immunofix Electr Int: NOT DETECTED

## 2018-02-19 NOTE — Progress Notes (Signed)
Please advise vitamin D 2000 units daily.

## 2018-02-20 DIAGNOSIS — I2510 Atherosclerotic heart disease of native coronary artery without angina pectoris: Secondary | ICD-10-CM | POA: Diagnosis not present

## 2018-02-20 DIAGNOSIS — K5790 Diverticulosis of intestine, part unspecified, without perforation or abscess without bleeding: Secondary | ICD-10-CM | POA: Diagnosis not present

## 2018-02-20 DIAGNOSIS — L89110 Pressure ulcer of right upper back, unstageable: Secondary | ICD-10-CM | POA: Diagnosis not present

## 2018-02-20 DIAGNOSIS — I255 Ischemic cardiomyopathy: Secondary | ICD-10-CM | POA: Diagnosis not present

## 2018-02-20 DIAGNOSIS — I5043 Acute on chronic combined systolic (congestive) and diastolic (congestive) heart failure: Secondary | ICD-10-CM | POA: Diagnosis not present

## 2018-02-20 DIAGNOSIS — I110 Hypertensive heart disease with heart failure: Secondary | ICD-10-CM | POA: Diagnosis not present

## 2018-02-22 ENCOUNTER — Other Ambulatory Visit: Payer: Self-pay | Admitting: Pulmonary Disease

## 2018-02-24 ENCOUNTER — Ambulatory Visit (INDEPENDENT_AMBULATORY_CARE_PROVIDER_SITE_OTHER): Payer: Medicare Other

## 2018-02-24 DIAGNOSIS — I110 Hypertensive heart disease with heart failure: Secondary | ICD-10-CM | POA: Diagnosis not present

## 2018-02-24 DIAGNOSIS — I5042 Chronic combined systolic (congestive) and diastolic (congestive) heart failure: Secondary | ICD-10-CM | POA: Diagnosis not present

## 2018-02-24 DIAGNOSIS — Z95810 Presence of automatic (implantable) cardiac defibrillator: Secondary | ICD-10-CM

## 2018-02-24 DIAGNOSIS — I5043 Acute on chronic combined systolic (congestive) and diastolic (congestive) heart failure: Secondary | ICD-10-CM | POA: Diagnosis not present

## 2018-02-24 DIAGNOSIS — K5790 Diverticulosis of intestine, part unspecified, without perforation or abscess without bleeding: Secondary | ICD-10-CM | POA: Diagnosis not present

## 2018-02-24 DIAGNOSIS — I2510 Atherosclerotic heart disease of native coronary artery without angina pectoris: Secondary | ICD-10-CM | POA: Diagnosis not present

## 2018-02-24 DIAGNOSIS — L89110 Pressure ulcer of right upper back, unstageable: Secondary | ICD-10-CM | POA: Diagnosis not present

## 2018-02-24 DIAGNOSIS — I255 Ischemic cardiomyopathy: Secondary | ICD-10-CM | POA: Diagnosis not present

## 2018-02-24 NOTE — Progress Notes (Signed)
EPIC Encounter for ICM Monitoring  Patient Name: [name] is a 84 y.o. male Date: 02/24/2018 Primary Care Physican: Noralee Space, MD Primary Cardiologist: Hochrein Electrophysiologist: Faustino Congress Weight:unknown  Call todaughter [name]. Heart Failure questions reviewed, pt asymptomatic.  Advised report remains unchanged and showing fluid but since he has had problems with low BP then will continue to monitor unless Dr Percival Spanish wants to make any changes.    Thoracic impedance  abnormal suggesting fluid accumulation since 10/31/2017.  Prescribed dosage: Furosemide20 mg 1 tabletMonday, Wednesday and Friday.Potassium 20 mEq 1 tablet every Monday, Wednesday and Friday. Per Dr Cherlyn Cushing note 01/17/2018, does not want to increase diuretic due to hypotension and orthostatic issues.  Labs: 02/13/2018 Creatinine 1.15, BUN 24, Potassium 5.1, Sodium 138, EGFR 58-67 02/10/2018 Creatinine 0.98, BUN 23, Potassium 4.0, Sodium 134, EGFR >60  12/16/2017 Creatinine 0.90, BUN 17, Potassium 4.4, Sodium 138 11/13/2017 Creatinine 0.92, BUN 16, Potassium 4.3, Sodium 136, EGFR 76-88 10/30/2017 Creatinine 1.06, BUN 14, Potassium 4.6, Sodium 134, EGFR 64-74 10/17/2017 Creatinine0.94, BUN11, Potassium3.6, Sodium134, EGFR>60 10/16/2017 Creatinine0.87, BUN9, Potassium3.6, Sodium135, EGFR>60 10/15/2017 Creatinine0.89, BUN9, Potassium3.8, Sodium132, EGFR>60 12/24/2018Creatinine 0.85, BUN10, Potassium3.9, Sodium133, EGFR>60  12/23/2018Creatinine 0.87, BUN13, Potassium3.6, Sodium135, EGFR>60 12/22/2018Creatinine 0.91, BUN15, Potassium2.8, Sodium133, EGFR>60  12/21/2018Creatinine 0.95, BUN15, Potassium3.2, Sodium134, EGFR>60  12/20/2018Creatinine 1.03, BUN15, Potassium3.6, Sodium135, EGFR>60  A complete set of results can be found in Results Review.  Recommendations:  No changes but encouraged to  call if he develops fluid symptoms.  Follow-up plan: ICM clinic phone appointment on 03/27/2018.  Office appointment scheduled 03/18/2018 with Jana Half.  Copy of ICM check sent to Dr. Percival Spanish and Dr. Caryl Comes.   3 month ICM trend: 02/24/2018    1 Year ICM trend:       [name], RN 02/24/2018 12:12 PM

## 2018-02-25 DIAGNOSIS — I2510 Atherosclerotic heart disease of native coronary artery without angina pectoris: Secondary | ICD-10-CM | POA: Diagnosis not present

## 2018-02-25 DIAGNOSIS — L89110 Pressure ulcer of right upper back, unstageable: Secondary | ICD-10-CM | POA: Diagnosis not present

## 2018-02-25 DIAGNOSIS — L218 Other seborrheic dermatitis: Secondary | ICD-10-CM | POA: Diagnosis not present

## 2018-02-25 DIAGNOSIS — Z85828 Personal history of other malignant neoplasm of skin: Secondary | ICD-10-CM | POA: Diagnosis not present

## 2018-02-25 DIAGNOSIS — I110 Hypertensive heart disease with heart failure: Secondary | ICD-10-CM | POA: Diagnosis not present

## 2018-02-25 DIAGNOSIS — I5043 Acute on chronic combined systolic (congestive) and diastolic (congestive) heart failure: Secondary | ICD-10-CM | POA: Diagnosis not present

## 2018-02-25 DIAGNOSIS — K5790 Diverticulosis of intestine, part unspecified, without perforation or abscess without bleeding: Secondary | ICD-10-CM | POA: Diagnosis not present

## 2018-02-25 DIAGNOSIS — I255 Ischemic cardiomyopathy: Secondary | ICD-10-CM | POA: Diagnosis not present

## 2018-02-25 DIAGNOSIS — Z08 Encounter for follow-up examination after completed treatment for malignant neoplasm: Secondary | ICD-10-CM | POA: Diagnosis not present

## 2018-02-26 DIAGNOSIS — L98422 Non-pressure chronic ulcer of back with fat layer exposed: Secondary | ICD-10-CM | POA: Diagnosis not present

## 2018-02-26 DIAGNOSIS — J449 Chronic obstructive pulmonary disease, unspecified: Secondary | ICD-10-CM | POA: Diagnosis not present

## 2018-02-26 DIAGNOSIS — Z87891 Personal history of nicotine dependence: Secondary | ICD-10-CM | POA: Diagnosis not present

## 2018-02-26 DIAGNOSIS — Z955 Presence of coronary angioplasty implant and graft: Secondary | ICD-10-CM | POA: Diagnosis not present

## 2018-02-26 DIAGNOSIS — I10 Essential (primary) hypertension: Secondary | ICD-10-CM | POA: Diagnosis not present

## 2018-02-26 DIAGNOSIS — L89104 Pressure ulcer of unspecified part of back, stage 4: Secondary | ICD-10-CM | POA: Diagnosis not present

## 2018-02-27 ENCOUNTER — Telehealth: Payer: Self-pay | Admitting: Pulmonary Disease

## 2018-02-27 NOTE — Telephone Encounter (Signed)
Left message for [name] to call back, I don't see anything as of right now on fax or SN cubby. LM for [name] to re-fax the plan of care to the triage fax. Will await forms or CB from [place].

## 2018-02-28 DIAGNOSIS — L89110 Pressure ulcer of right upper back, unstageable: Secondary | ICD-10-CM | POA: Diagnosis not present

## 2018-02-28 DIAGNOSIS — I255 Ischemic cardiomyopathy: Secondary | ICD-10-CM | POA: Diagnosis not present

## 2018-02-28 DIAGNOSIS — I110 Hypertensive heart disease with heart failure: Secondary | ICD-10-CM | POA: Diagnosis not present

## 2018-02-28 DIAGNOSIS — I5043 Acute on chronic combined systolic (congestive) and diastolic (congestive) heart failure: Secondary | ICD-10-CM | POA: Diagnosis not present

## 2018-02-28 DIAGNOSIS — I2510 Atherosclerotic heart disease of native coronary artery without angina pectoris: Secondary | ICD-10-CM | POA: Diagnosis not present

## 2018-02-28 DIAGNOSIS — K5790 Diverticulosis of intestine, part unspecified, without perforation or abscess without bleeding: Secondary | ICD-10-CM | POA: Diagnosis not present

## 2018-02-28 NOTE — Telephone Encounter (Signed)
Unable to locate forms for pt, spoke with Lattie Haw, LPN, and she has not seen them either. LMTCB for Melinda to have forms refaxed.

## 2018-03-03 NOTE — Progress Notes (Signed)
Office Visit Note  Patient: [name]             Date of Birth: Jun 27, 1931           MRN: [id number]             PCP: Noralee Space, MD Referring: Noralee Space, MD Visit Date: 03/13/2018 Occupation: @GUAROCC @    Subjective:  Medication management   History of Present Illness: [name] is a 84 y.o. male with history of osteoporosis.  Patient was started on vitamin D recently after the lab work.  He was accompanied by his wife today who says states that patient took Fosamax for about 3 to 4 months and that it was a stopped as he was hospitalized.  He has been taking calcium and vitamin D now.  He recalls having no problems with Fosamax.  He also gives history of chronic gout.  He states he has been on allopurinol for multiple years and has not had a gout flare in many years.  None of the other joints are painful.  Activities of Daily Living:  Patient reports morning stiffness for 0 none.   Patient Denies nocturnal pain.  Difficulty dressing/grooming: Denies Difficulty climbing stairs: Reports Difficulty getting out of chair: Denies Difficulty using hands for taps, buttons, cutlery, and/or writing: Denies   Review of Systems  Constitutional: Positive for fatigue. Negative for night sweats.  HENT: Positive for mouth dryness. Negative for mouth sores and nose dryness.   Eyes: Negative for redness and dryness.  Respiratory: Positive for shortness of breath. Negative for difficulty breathing.   Cardiovascular: Positive for swelling in legs/feet. Negative for chest pain, palpitations, hypertension and irregular heartbeat.  Gastrointestinal: Negative for constipation and diarrhea.  Endocrine: Positive for increased urination. Negative for cold intolerance and excessive thirst.  Genitourinary: Negative for difficulty urinating.  Musculoskeletal: Positive for gait problem and muscle weakness. Negative for arthralgias, joint pain, joint swelling, myalgias, morning stiffness,  muscle tenderness and myalgias.  Skin: Negative for color change, rash, hair loss, nodules/bumps, skin tightness, ulcers and sensitivity to sunlight.  Allergic/Immunologic: Negative for susceptible to infections.  Neurological: Positive for dizziness, light-headedness and weakness. Negative for fainting, memory loss and night sweats.  Hematological: Negative for bruising/bleeding tendency and swollen glands.  Psychiatric/Behavioral: Negative for depressed mood and sleep disturbance. The patient is not nervous/anxious.     PMFS History:  Patient Active Problem List   Diagnosis Date Noted  . Physical deconditioning 11/13/2017  . Weakness 11/12/2017  . Falls 11/12/2017  . Pleural effusion   . Pressure injury of skin 10/01/2017  . Acute hypoxemic respiratory failure ([place]) 09/30/2017  . CHF exacerbation ([place]) 09/30/2017  . Shingles outbreak 05/27/2017  . Seborrheic dermatitis 05/27/2017  . Acute on chronic respiratory failure with hypoxia ([place]) 01/13/2017  . COPD with acute bronchitis ([place]) 01/13/2017  . CAD S/P percutaneous coronary angioplasty   . Chronic combined systolic and diastolic CHF (congestive heart failure) ([place])   . Blunt chest trauma 05/04/2016  . 4.1 cm Aneurysm of thoracic aorta ([place]) 04/27/2016  . SIADH (syndrome of inappropriate ADH production) ([place])   . Orthostatic hypotension 04/12/2016  . TBI (traumatic brain injury) ([place])   . Acute on chronic combined systolic and diastolic congestive heart failure ([place])   . S/P ORIF (open reduction internal fixation) fracture   . Thrombocytopenia ([place])   . Urinary retention   . Slow transit constipation   . Thyroid activity decreased   . Traumatic subdural  hematoma with loss of consciousness ([place])   . Pulmonary hypertension ([place])   . Cardiomyopathy, ischemic   . Subdural hematoma ([place])   . Cardiac arrest ([place]) 03/29/2016  . 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 CAP (community acquired pneumonia) 04/28/2015  .  T wave over sensing resulting in inappropriate shocks 08/14/2013  . Vitamin D deficiency disease 04/09/2012  . Actinic skin damage 05/22/2011  . Alcohol use 03/22/2011  . Other dysphagia 02/16/2011  . Carotid arterial disease ([place]) 11/17/2009  . GOUT 09/23/2009  . Age-related osteoporosis with current pathological fracture 09/23/2009  . SYNCOPE 04/19/2009  . Closed fracture of bone 07/27/2008  . Automatic implantable cardioverter-defibrillator in situ 04/13/2008  . COLONIC POLYPS 12/09/2007  . DIVERTICULOSIS OF COLON 12/09/2007  . Essential hypertension 12/08/2007  . Peripheral vascular disease ([place]) 12/08/2007  . COPD GOLD III 12/08/2007  . Osteoarthritis 12/08/2007    Past Medical History:  Diagnosis Date  . AAA (abdominal aortic aneurysm) ([place])    a. 2002 s/p repair.  . Allergic rhinitis   . Back pain   . CAD (coronary artery disease)    a. 02/2002 H/o MI with stenting x 2; b. 04/2013 MV: EF 25%, large posterior lateral infarct w/o ischemia; c. 03/2016 VT Arrest/Cath: LM 60ost, LAD 40p/m ISR, LCX 100p/m, RCA nl-->Med Rx.  . Carotid arterial disease ([place])    a. 12/2010 s/p R CEA;  b. 05/2015 Carotid U/S: bilat <40% ICA stenosis.  . Chronic combined systolic and diastolic CHF (congestive heart failure) ([place])    a. 03/2016 Echo: Ef 35-40%, grade 1 DD.  Marland Kitchen Compression fracture   . COPD (chronic obstructive pulmonary disease) ([place])   . Dermatitis   . Diverticulosis of colon   . DJD (degenerative joint disease)    and Gout  . Hemorrhoids   . History of colonic polyps   . History of gout   . History of pneumonia   . Hypercholesterolemia   . Hypertension   . Hypertensive heart disease   . Ischemic cardiomyopathy    a. EF prev <35%-->improved to normal by Echo 8/14:  Mild LVH, focal basal hypertrophy, EF 60-65%, normal wall motion, mild BAE, PASP 36; c. 03/2016 Echo: EF 35-40%, Gr1 DD, triv AI, mild MR, mod dil LA, mild-mod TR, PASP 31mmHg.  . Osteoporosis   . Peripheral vascular  disease ([place])   . Presence of cardiac defibrillator    a. 03/2008 s/p MDT [id number] Maximo II DR, DC AICD; b. 03/2013: ICD shock for T wave oversensing;  c. 10/2015: collective decision not to replace ICD given improvement in LV fxn; d. VT Arrest-->Gen change to MDT ser # [id number].  . Skin cancer    shoulders and forehead  . Syncope   . T wave over sensing resulting in inappropriate shocks    a. 03/2013.  . Tobacco abuse   . Ventricular tachycardia ([place]) 04/01/2016    Family History  Problem Relation Age of Onset  . Hypertension Father   . Heart disease Father        Heart Disease before age 6  . Hypertension Mother   . Heart disease Mother        Heart Disease before age 85  . Cancer Mother   . Scoliosis Sister   . Thyroid disease Daughter   . Anxiety disorder Son    Past Surgical History:  Procedure Laterality Date  . ABDOMINAL AORTIC ANEURYSM REPAIR  2002   by Dr. Kellie Simmering  . aicd  placed  03/2008   Dr. Caryl Comes  . cad stent  02/2002   Dr. Percival Spanish  . CARDIAC CATHETERIZATION     X 2 stents  . CARDIAC CATHETERIZATION N/A 04/03/2016   Procedure: Left Heart Cath and Coronary Angiography;  Surgeon: Belva Crome, MD;  Location: [facility];  Service: Cardiovascular;  Laterality: N/A;  . CARDIAC DEFIBRILLATOR PLACEMENT  2009  . CARDIAC DEFIBRILLATOR PLACEMENT    . CAROTID ENDARTERECTOMY Right January 02, 2011  . EAR CYST EXCISION Left 12/13/2015   Procedure: Excision left ear lesion ;  Surgeon: Melissa Montane, MD;  Location: [facility];  Service: ENT;  Laterality: Left;  . EP IMPLANTABLE DEVICE N/A 04/06/2016   Procedure:  ICD Generator Changeout;  Surgeon: Deboraha Sprang, MD;  Location: [facility];  Service: Cardiovascular;  Laterality: N/A;  . EXCISION OF LESION LEFT EAR Left 12/13/2015  . I&D EXTREMITY Left 04/23/2016   Procedure: IRRIGATION AND DEBRIDEMENT HAND;  Surgeon: Leandrew Koyanagi, MD;  Location: [facility];  Service: Orthopedics;  Laterality: Left;  . IR IMAGE GUIDED DRAINAGE BY  PERCUTANEOUS CATHETER  10/08/2017  . OPEN REDUCTION INTERNAL FIXATION (ORIF) METACARPAL Left 04/04/2016   Procedure: OPEN REDUCTION INTERNAL FIXATION (ORIF) LEFT 2ND, 3RD, 4TH METACARPAL FRACTURE;  Surgeon: Leandrew Koyanagi, MD;  Location: [facility];  Service: Orthopedics;  Laterality: Left;  OPEN REDUCTION INTERNAL FIXATION (ORIF) LEFT 2ND, 3RD, 4TH METACARPAL FRACTURE  . OPEN REDUCTION INTERNAL FIXATION (ORIF) METACARPAL Left 04/23/2016   Procedure: REVISION OPEN REDUCTION INTERNAL FIXATION (ORIF) 2ND METACARPAL;  Surgeon: Leandrew Koyanagi, MD;  Location: [facility];  Service: Orthopedics;  Laterality: Left;  . right corotid enderectomy  12/2010   Dr. Scot Dock  . SKIN SPLIT GRAFT Left 12/13/2015   Procedure: with possible skin graft;  Surgeon: Melissa Montane, MD;  Location: [facility];  Service: ENT;  Laterality: Left;  . TONSILLECTOMY     Social History   Social History Narrative   ** Merged History Encounter **       Lives locally.  Had been living with wife who had become quite ill recently and died on the evening of 2016-04-25.     Objective: Vital Signs: BP 132/81 (BP Location: Left Arm, Patient Position: Sitting, Cuff Size: Normal)   Pulse 81   Resp 18   Ht 5\' 11"  (1.803 m)   Wt 168 lb (76.2 kg)   BMI 23.43 kg/m    Physical Exam  Constitutional: He is oriented to person, place, and time. He appears well-developed and well-nourished.  HENT:  Head: Normocephalic and atraumatic.  Eyes: Pupils are equal, round, and reactive to light. Conjunctivae and EOM are normal.  Neck: Normal range of motion. Neck supple.  Cardiovascular: Normal rate, regular rhythm and normal heart sounds.  Pacemaker and defibrillator  Pulmonary/Chest: Effort normal and breath sounds normal.  Abdominal: Soft. Bowel sounds are normal.  Neurological: He is alert and oriented to person, place, and time.  Skin: Skin is warm and dry. Capillary refill takes less than 2 seconds.  Psychiatric: He has a normal mood and affect. His behavior is  normal.  Nursing note and vitals reviewed.    Musculoskeletal Exam: C-spine thoracic spine good range of motion.  He has some discomfort range of motion of his lumbar spine.  Shoulder joints elbow joints wrist joint MCPs PIPs DIPs are in good range of motion with no synovitis.  Hip joints knee joints ankles MTPs PIPs were in good range of motion  with no synovitis.  CDAI Exam: No CDAI exam completed.    Investigation: No additional findings. CBC Latest Ref Rng & Units 02/13/2018 02/10/2018 12/16/2017  WBC 3.8 - 10.8 Thousand/uL 9.0 9.0 9.5  Hemoglobin 13.2 - 17.1 g/dL 14.0 13.2 13.7  Hematocrit 38.5 - 50.0 % 41.1 39.4 40.1  Platelets 140 - 400 Thousand/uL 142 132(L) 133.0(L)   CMP     Component Value Date/Time   NA 138 02/13/2018 1047   NA 136 11/13/2017 1637   K 5.1 02/13/2018 1047   CL 103 02/13/2018 1047   CO2 32 02/13/2018 1047   GLUCOSE 80 02/13/2018 1047   GLUCOSE 93 11/01/2006 0750   BUN 24 02/13/2018 1047   BUN 16 11/13/2017 1637   CREATININE 1.15 (H) 02/13/2018 1047   CALCIUM 9.5 02/13/2018 1047   PROT 7.3 02/13/2018 1047   PROT 7.0 02/13/2018 1047   PROT 6.5 11/13/2017 1637   ALBUMIN 3.4 (L) 02/10/2018 0848   ALBUMIN 3.2 (L) 11/13/2017 1637   AST 29 02/13/2018 1047   ALT 21 02/13/2018 1047   ALKPHOS 88 02/10/2018 0848   BILITOT 0.4 02/13/2018 1047   BILITOT 0.3 11/13/2017 1637   GFRNONAA 58 (L) 02/13/2018 1047   GFRAA 67 02/13/2018 1047  IFE negative for monoclonal protein, TSH 7.65 by, phosphorus normal, PTH normal, vitamin D 24  Imaging: Dg Bone Density  Result Date: 02/23/2018 Date of study: 02/17/18 Exam: DUAL X-RAY ABSORPTIOMETRY (DXA) FOR BONE MINERAL DENSITY (BMD) Instrument: [employer] Provider: PCP Indication: screening for osteoporosis Comparison: none (please note that it is not possible to compare data from different instruments) Clinical data: Pt is a 84 y.o. male with history of fracture. Results:  Lumbar spine L1-L4 Femoral neck  (FN) 33% distal radius T-score 1.7 RFN: -2.7 LFN: -1.9 n/a Change in BMD from previous DXA test (%) Up 18.2% Up 11.4% n/a (*) statistically significant Assessment: Patient has OSTEOPOROSIS according to the [facility] classification for osteoporosis (see below). Fracture risk: high Comments: the technical quality of the study is good  L2 and L3 were excluded due to degenerative changes. Evaluation for secondary causes should be considered if clinically indicated. Recommend optimizing calcium (1200 mg/day) and vitamin D (800 IU/day). Followup: Repeat BMD is appropriate after 2 years or after 1-2 years if starting treatment. WHO criteria for diagnosis of osteoporosis in postmenopausal women and in men 61 y/o or older: - normal: T-score -1.0 to + 1.0 - osteopenia/low bone density: T-score between -2.5 and -1.0 - osteoporosis: T-score below -2.5 - severe osteoporosis: T-score below -2.5 with history of fragility fracture Note: although not part of the WHO classification, the presence of a fragility fracture, regardless of the T-score, should be considered diagnostic of osteoporosis, provided other causes for the fracture have been excluded. Treatment: The National Osteoporosis Foundation recommends that treatment be considered in postmenopausal women and men age 66 or older with: 1. Hip or vertebral (clinical or morphometric) fracture 2. T-score of - 2.5 or lower at the spine or hip 3. 10-year fracture probability by FRAX of at least 20% for a major osteoporotic fracture and 3% for a hip fracture Loura Pardon MD    Speciality Comments: No specialty comments available.    Procedures:  No procedures performed Allergies: Lisinopril   Assessment / Plan:     Visit Diagnoses: Age-related osteoporosis with current pathological fracture with routine healing, subsequent encounter - History of T12 compression fracture in 1999.  L3 lumbar vertebral compression fracture.  He is on  Fosamax 70 mg p.o. weekly. DXA 2019 T -2.7  increased by 11.4%.  Patient denies any recent history of vertebral compression fractures.  He was a started on Fosamax which she took for 4 months and then discontinued tests due to hospitalizations.  His most recent bone density in 2019 showed improvement in his BMD.  We had detailed discussion regarding different treatment options including IV Reclast subcu Prolia and Forteo.  At this point he and his wife believe that they have not taken any treatment so far.  They have taken Fosamax only for 4 months which was a stopped.  They want to try Fosamax.  Indications Rx contraindications were discussed.  Have given him a prescription for Fosamax 70 mg p.o. weekly.  He will get labs in 6 months.  Vitamin D deficiency disease-vitamin D deficiency could be contributing to his osteoporosis as well.  He was placed on vitamin D 50,000 units once a week for 3 months.  We will check his vitamin D levels again.  After that he will need maintenance vitamin D .  Labs done during the last visit were also discussed at length.  He had high TSH.  Have advised him to make a follow-up appointment with his PCP.  Former smoker  History of COPD  Idiopathic chronic gout of multiple sites without tophus-patient reports that he has been taking allopurinol on regular basis and has not had any gout flares.  Automatic implantable cardioverter-defibrillator in situ  CAD S/P percutaneous coronary angioplasty  Cardiomyopathy, ischemic  Chronic combined systolic and diastolic CHF (congestive heart failure) ([place])  Essential hypertension  Pulmonary hypertension ([place])   Other medical problems are listed as follows:  History of subdural hematoma  History of traumatic brain injury  Acute on chronic combined systolic and diastolic congestive heart failure (H[facility])  Essential hypertension  Peripheral vascular disease ([facility])  COPD GOLD III  SIADH (syndrome of inappropriate ADH production) ([facility])  Basal cell  carcinoma ([facility]) of auricle of left ear  History of diverticulosis     Orders: No orders of the defined types were placed in this encounter.  Meds ordered this encounter  Medications  . alendronate (FOSAMAX) 70 MG tablet    Sig: Take 1 tablet (70 mg total) by mouth once a week. Take with a full glass of water on an empty stomach.    Dispense:  4 tablet    Refill:  5    Face-to-face time spent with patient was 30 minutes. >50% of time was spent in counseling and coordination of care.  Follow-Up Instructions: Return in about 6 months (around 09/13/2018) for Osteoporosis, Osteoarthritis.   Bo Merino, MD  Note - This record has been created using Editor, commissioning.  Chart creation errors have been sought, but may not always  have been located. Such creation errors do not reflect on  the standard of medical care.

## 2018-03-03 NOTE — Telephone Encounter (Signed)
Found signed forms in Lisa's inbox. Faxed forms back to [place]. Left a detailed message for Rip Harbour advising that the forms have faxed back to her. Will place forms back in Lisa's inbox in case the fax was unsuccessful.

## 2018-03-05 ENCOUNTER — Telehealth: Payer: Self-pay | Admitting: Pulmonary Disease

## 2018-03-05 DIAGNOSIS — I255 Ischemic cardiomyopathy: Secondary | ICD-10-CM | POA: Diagnosis not present

## 2018-03-05 DIAGNOSIS — I2510 Atherosclerotic heart disease of native coronary artery without angina pectoris: Secondary | ICD-10-CM | POA: Diagnosis not present

## 2018-03-05 DIAGNOSIS — L89110 Pressure ulcer of right upper back, unstageable: Secondary | ICD-10-CM | POA: Diagnosis not present

## 2018-03-05 DIAGNOSIS — K5790 Diverticulosis of intestine, part unspecified, without perforation or abscess without bleeding: Secondary | ICD-10-CM | POA: Diagnosis not present

## 2018-03-05 DIAGNOSIS — I5043 Acute on chronic combined systolic (congestive) and diastolic (congestive) heart failure: Secondary | ICD-10-CM | POA: Diagnosis not present

## 2018-03-05 DIAGNOSIS — I110 Hypertensive heart disease with heart failure: Secondary | ICD-10-CM | POA: Diagnosis not present

## 2018-03-05 NOTE — Telephone Encounter (Signed)
[name] called back CB [phone]

## 2018-03-05 NOTE — Telephone Encounter (Signed)
lmtcb x1 for Jeannie.

## 2018-03-05 NOTE — Telephone Encounter (Signed)
Per SN- okay to continue with PT. lmtcb x1 for Jeannie.

## 2018-03-05 NOTE — Telephone Encounter (Signed)
Called and spoke to [name] with [name].  [name] is requesting verbal to continue PT at [name] for four more weeks.   SN please advise. Thanks.   Current Outpatient Medications on File Prior to Visit  Medication Sig Dispense Refill  . albuterol (PROVENTIL HFA;VENTOLIN HFA) 108 (90 Base) MCG/ACT inhaler Inhale 2 puffs into the lungs every 6 (six) hours as needed for wheezing or shortness of breath. 1 Inhaler 2  . alendronate (FOSAMAX) 70 MG tablet Take 1 tablet (70 mg total) by mouth once a week. Take with a full glass of water on an empty stomach. 4 tablet 5  . allopurinol (ZYLOPRIM) 100 MG tablet TAKE 1 TABLET ONCE DAILY. (Patient taking differently: TAKE 1 TABLET (100mg ) ONCE DAILY.) 30 tablet 2  . amiodarone (PACERONE) 200 MG tablet TAKE 1 TABLET (200mg ) ONCE DAILY. 30 tablet 3  . carvedilol (COREG) 3.125 MG tablet TAKE 1 TABLET TWICE DAILY WITH A MEAL. (Patient taking differently: TAKE 1 TABLET (3.125mg ) TWICE DAILY WITH A MEAL AS NEEDED) 60 tablet 6  . cholecalciferol (VITAMIN D) 1000 units tablet Take 1,000 Units by mouth daily.    . clopidogrel (PLAVIX) 75 MG tablet TAKE 1 TABLET ONCE DAILY. 30 tablet 2  . fluticasone-salmeterol (ADVAIR HFA) 115-21 MCG/ACT inhaler Inhale 2 puffs into the lungs 2 (two) times daily. 1 Inhaler 5  . Fluticasone-Salmeterol 113-14 MCG/ACT AEPB INHALE 2 PUFFS TWICE DAILY 1 each 0  . furosemide (LASIX) 20 MG tablet Take 1 tablet Monday, Wednesday and Friday (Patient taking differently: Take 20 mg by mouth See admin instructions. Take 1 tablet Monday, Wednesday and Friday) 30 tablet 2  . guaiFENesin (MUCINEX) 600 MG 12 hr tablet Take 600-1,200 mg by mouth 2 (two) times daily. Takes two in the morning and one in the evening.    Marland Kitchen levothyroxine (SYNTHROID, LEVOTHROID) 50 MCG tablet Take 50 mcg by mouth daily before breakfast.    . magnesium oxide (MAG-OX) 400 (241.3 Mg) MG tablet Take 0.5 tablets (200 mg total) by mouth daily. 30 tablet 0  . Maltodextrin-Xanthan Gum  (RESOURCE THICKENUP CLEAR) POWD Take 120 g by mouth as needed. 1 Can 0  . midodrine (PROAMATINE) 2.5 MG tablet Take 1 tablet (2.5 mg total) by mouth 3 (three) times daily with meals. 90 tablet 3  . Multiple Vitamin (MULTIVITAMIN) capsule Take 1 capsule by mouth daily.    Marland Kitchen oxybutynin (DITROPAN) 5 MG tablet TAKE 1 TABLET BY MOUTH TWICE DAILY. 60 tablet 1  . polyethylene glycol powder (QC NATURA-LAX) powder DISSOLVE ONE CAPFUL IN 8 0Z. WATER TWICE DAILY. (Patient not taking: Reported on 02/17/2018) 500 g 0  . potassium chloride (K-DUR) 10 MEQ tablet Take 1 tablet Monday, Wednesday and Friday (Patient taking differently: Take 10 mEq by mouth See admin instructions. Take 1 tablet Monday, Wednesday and Friday) 30 tablet 3  . pravastatin (PRAVACHOL) 40 MG tablet TAKE 1 TABLET ONCE DAILY. 90 tablet 0  . sertraline (ZOLOFT) 50 MG tablet TAKE ONE TABLET AT BEDTIME. 30 tablet 2  . thiamine 100 MG tablet Take 1 tablet (100 mg total) by mouth daily. 30 tablet 0  . traMADol (ULTRAM) 50 MG tablet Take 1 tablet (50 mg total) by mouth every 6 (six) hours as needed for severe pain. (Patient not taking: Reported on 02/17/2018) 30 tablet 0   No current facility-administered medications on file prior to visit.     Allergies  Allergen Reactions  . Lisinopril     BP dropped too low per daughter

## 2018-03-06 NOTE — Telephone Encounter (Signed)
Spoke with [facility]. She has been given the verbal order to continue PT. Nothing further was needed.

## 2018-03-10 DIAGNOSIS — I110 Hypertensive heart disease with heart failure: Secondary | ICD-10-CM | POA: Diagnosis not present

## 2018-03-10 DIAGNOSIS — I255 Ischemic cardiomyopathy: Secondary | ICD-10-CM | POA: Diagnosis not present

## 2018-03-10 DIAGNOSIS — I2510 Atherosclerotic heart disease of native coronary artery without angina pectoris: Secondary | ICD-10-CM | POA: Diagnosis not present

## 2018-03-10 DIAGNOSIS — K5790 Diverticulosis of intestine, part unspecified, without perforation or abscess without bleeding: Secondary | ICD-10-CM | POA: Diagnosis not present

## 2018-03-10 DIAGNOSIS — I5043 Acute on chronic combined systolic (congestive) and diastolic (congestive) heart failure: Secondary | ICD-10-CM | POA: Diagnosis not present

## 2018-03-10 DIAGNOSIS — L89110 Pressure ulcer of right upper back, unstageable: Secondary | ICD-10-CM | POA: Diagnosis not present

## 2018-03-12 DIAGNOSIS — Z87891 Personal history of nicotine dependence: Secondary | ICD-10-CM | POA: Diagnosis not present

## 2018-03-12 DIAGNOSIS — L89104 Pressure ulcer of unspecified part of back, stage 4: Secondary | ICD-10-CM | POA: Diagnosis not present

## 2018-03-12 DIAGNOSIS — I10 Essential (primary) hypertension: Secondary | ICD-10-CM | POA: Diagnosis not present

## 2018-03-12 DIAGNOSIS — L98422 Non-pressure chronic ulcer of back with fat layer exposed: Secondary | ICD-10-CM | POA: Diagnosis not present

## 2018-03-12 DIAGNOSIS — Z955 Presence of coronary angioplasty implant and graft: Secondary | ICD-10-CM | POA: Diagnosis not present

## 2018-03-12 DIAGNOSIS — J449 Chronic obstructive pulmonary disease, unspecified: Secondary | ICD-10-CM | POA: Diagnosis not present

## 2018-03-13 ENCOUNTER — Encounter: Payer: Self-pay | Admitting: Rheumatology

## 2018-03-13 ENCOUNTER — Ambulatory Visit (INDEPENDENT_AMBULATORY_CARE_PROVIDER_SITE_OTHER): Payer: Medicare Other | Admitting: Rheumatology

## 2018-03-13 VITALS — BP 132/81 | HR 81 | Resp 18 | Ht 71.0 in | Wt 168.0 lb

## 2018-03-13 DIAGNOSIS — Z87891 Personal history of nicotine dependence: Secondary | ICD-10-CM | POA: Diagnosis not present

## 2018-03-13 DIAGNOSIS — M1A09X0 Idiopathic chronic gout, multiple sites, without tophus (tophi): Secondary | ICD-10-CM | POA: Diagnosis not present

## 2018-03-13 DIAGNOSIS — I255 Ischemic cardiomyopathy: Secondary | ICD-10-CM | POA: Diagnosis not present

## 2018-03-13 DIAGNOSIS — I272 Pulmonary hypertension, unspecified: Secondary | ICD-10-CM | POA: Diagnosis not present

## 2018-03-13 DIAGNOSIS — I5042 Chronic combined systolic (congestive) and diastolic (congestive) heart failure: Secondary | ICD-10-CM | POA: Diagnosis not present

## 2018-03-13 DIAGNOSIS — Z9861 Coronary angioplasty status: Secondary | ICD-10-CM

## 2018-03-13 DIAGNOSIS — Z8709 Personal history of other diseases of the respiratory system: Secondary | ICD-10-CM | POA: Diagnosis not present

## 2018-03-13 DIAGNOSIS — E559 Vitamin D deficiency, unspecified: Secondary | ICD-10-CM

## 2018-03-13 DIAGNOSIS — I10 Essential (primary) hypertension: Secondary | ICD-10-CM | POA: Diagnosis not present

## 2018-03-13 DIAGNOSIS — I2510 Atherosclerotic heart disease of native coronary artery without angina pectoris: Secondary | ICD-10-CM

## 2018-03-13 DIAGNOSIS — M8000XD Age-related osteoporosis with current pathological fracture, unspecified site, subsequent encounter for fracture with routine healing: Secondary | ICD-10-CM

## 2018-03-13 DIAGNOSIS — Z95810 Presence of automatic (implantable) cardiac defibrillator: Secondary | ICD-10-CM

## 2018-03-13 MED ORDER — ALENDRONATE SODIUM 70 MG PO TABS: 70.0000 mg | ORAL_TABLET | ORAL | 5 refills | Status: DC

## 2018-03-14 ENCOUNTER — Other Ambulatory Visit: Payer: Self-pay | Admitting: Pulmonary Disease

## 2018-03-17 DIAGNOSIS — I5043 Acute on chronic combined systolic (congestive) and diastolic (congestive) heart failure: Secondary | ICD-10-CM | POA: Diagnosis not present

## 2018-03-17 DIAGNOSIS — L89110 Pressure ulcer of right upper back, unstageable: Secondary | ICD-10-CM | POA: Diagnosis not present

## 2018-03-17 DIAGNOSIS — I255 Ischemic cardiomyopathy: Secondary | ICD-10-CM | POA: Diagnosis not present

## 2018-03-17 DIAGNOSIS — K5790 Diverticulosis of intestine, part unspecified, without perforation or abscess without bleeding: Secondary | ICD-10-CM | POA: Diagnosis not present

## 2018-03-17 DIAGNOSIS — I2510 Atherosclerotic heart disease of native coronary artery without angina pectoris: Secondary | ICD-10-CM | POA: Diagnosis not present

## 2018-03-17 DIAGNOSIS — I110 Hypertensive heart disease with heart failure: Secondary | ICD-10-CM | POA: Diagnosis not present

## 2018-03-18 ENCOUNTER — Encounter: Payer: Self-pay | Admitting: Cardiology

## 2018-03-18 ENCOUNTER — Ambulatory Visit (INDEPENDENT_AMBULATORY_CARE_PROVIDER_SITE_OTHER): Payer: Medicare Other | Admitting: Cardiology

## 2018-03-18 VITALS — BP 142/70 | HR 70 | Ht 71.0 in | Wt 170.4 lb

## 2018-03-18 DIAGNOSIS — I5042 Chronic combined systolic (congestive) and diastolic (congestive) heart failure: Secondary | ICD-10-CM | POA: Diagnosis not present

## 2018-03-18 DIAGNOSIS — Z9861 Coronary angioplasty status: Secondary | ICD-10-CM

## 2018-03-18 DIAGNOSIS — Z95810 Presence of automatic (implantable) cardiac defibrillator: Secondary | ICD-10-CM | POA: Diagnosis not present

## 2018-03-18 DIAGNOSIS — I255 Ischemic cardiomyopathy: Secondary | ICD-10-CM

## 2018-03-18 DIAGNOSIS — I10 Essential (primary) hypertension: Secondary | ICD-10-CM | POA: Diagnosis not present

## 2018-03-18 DIAGNOSIS — I2510 Atherosclerotic heart disease of native coronary artery without angina pectoris: Secondary | ICD-10-CM

## 2018-03-18 NOTE — Assessment & Plan Note (Signed)
02/2002 H/o MI with stenting x 2 to LAD 03/2016 VT Arrest-Cath: LM 60ost, LAD 40p/m ISR, LCX 100p with L-L collaterals, RCA nl-->Med Rx.

## 2018-03-18 NOTE — Assessment & Plan Note (Signed)
B/P by me 120/60- he has been orthostatic in the past with symptoms- fall. He is on Midodrine
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
## 2018-03-18 NOTE — Patient Instructions (Signed)
Medication Instructions: Your physician recommends that you continue on your current medications as directed. Please refer to the Current Medication list given to you today.  If you need a refill on your cardiac medications before your next appointment, please call your pharmacy.   Follow-Up: Your physician wants you to follow-up in 3 months with Kerin Ransom, PA.   Special Instructions: Please take your blood pressure mid morning. If your systolic, the top number, is less than 100 please hold your Carvedilol.  Thank you for choosing [facility] at [facility]!!

## 2018-03-18 NOTE — Assessment & Plan Note (Signed)
Dec 2018-  Echo: Ef 35-40%, grade 2 DD. Mild AS

## 2018-03-18 NOTE — Assessment & Plan Note (Signed)
Gen change June 2017

## 2018-03-19 ENCOUNTER — Other Ambulatory Visit: Payer: Self-pay | Admitting: Pulmonary Disease

## 2018-03-20 ENCOUNTER — Other Ambulatory Visit: Payer: Self-pay | Admitting: *Deleted

## 2018-03-20 MED ORDER — POLYETHYLENE GLYCOL 3350 17 GM/SCOOP PO POWD: ORAL | 2 refills | Status: DC

## 2018-03-23 ENCOUNTER — Other Ambulatory Visit: Payer: Self-pay | Admitting: Pulmonary Disease

## 2018-03-24 DIAGNOSIS — L89110 Pressure ulcer of right upper back, unstageable: Secondary | ICD-10-CM | POA: Diagnosis not present

## 2018-03-24 DIAGNOSIS — I110 Hypertensive heart disease with heart failure: Secondary | ICD-10-CM | POA: Diagnosis not present

## 2018-03-24 DIAGNOSIS — K5790 Diverticulosis of intestine, part unspecified, without perforation or abscess without bleeding: Secondary | ICD-10-CM | POA: Diagnosis not present

## 2018-03-24 DIAGNOSIS — I5043 Acute on chronic combined systolic (congestive) and diastolic (congestive) heart failure: Secondary | ICD-10-CM | POA: Diagnosis not present

## 2018-03-24 DIAGNOSIS — I255 Ischemic cardiomyopathy: Secondary | ICD-10-CM | POA: Diagnosis not present

## 2018-03-24 DIAGNOSIS — I2510 Atherosclerotic heart disease of native coronary artery without angina pectoris: Secondary | ICD-10-CM | POA: Diagnosis not present

## 2018-03-26 ENCOUNTER — Encounter (HOSPITAL_BASED_OUTPATIENT_CLINIC_OR_DEPARTMENT_OTHER): Payer: Medicare Other | Attending: Physician Assistant

## 2018-03-26 DIAGNOSIS — Z87891 Personal history of nicotine dependence: Secondary | ICD-10-CM | POA: Insufficient documentation

## 2018-03-26 DIAGNOSIS — Z872 Personal history of diseases of the skin and subcutaneous tissue: Secondary | ICD-10-CM | POA: Diagnosis not present

## 2018-03-26 DIAGNOSIS — Z09 Encounter for follow-up examination after completed treatment for conditions other than malignant neoplasm: Secondary | ICD-10-CM | POA: Insufficient documentation

## 2018-03-26 DIAGNOSIS — I739 Peripheral vascular disease, unspecified: Secondary | ICD-10-CM | POA: Insufficient documentation

## 2018-03-26 DIAGNOSIS — I509 Heart failure, unspecified: Secondary | ICD-10-CM | POA: Diagnosis not present

## 2018-03-26 DIAGNOSIS — I252 Old myocardial infarction: Secondary | ICD-10-CM | POA: Insufficient documentation

## 2018-03-26 DIAGNOSIS — J449 Chronic obstructive pulmonary disease, unspecified: Secondary | ICD-10-CM | POA: Diagnosis not present

## 2018-03-26 DIAGNOSIS — L98422 Non-pressure chronic ulcer of back with fat layer exposed: Secondary | ICD-10-CM | POA: Diagnosis not present

## 2018-03-26 DIAGNOSIS — I110 Hypertensive heart disease with heart failure: Secondary | ICD-10-CM | POA: Diagnosis not present

## 2018-03-26 DIAGNOSIS — I2510 Atherosclerotic heart disease of native coronary artery without angina pectoris: Secondary | ICD-10-CM | POA: Insufficient documentation

## 2018-03-27 ENCOUNTER — Ambulatory Visit (INDEPENDENT_AMBULATORY_CARE_PROVIDER_SITE_OTHER): Payer: Medicare Other

## 2018-03-27 DIAGNOSIS — Z95810 Presence of automatic (implantable) cardiac defibrillator: Secondary | ICD-10-CM

## 2018-03-27 DIAGNOSIS — I5042 Chronic combined systolic (congestive) and diastolic (congestive) heart failure: Secondary | ICD-10-CM

## 2018-03-28 NOTE — Progress Notes (Signed)
EPIC Encounter for ICM Monitoring  Patient Name: [name] is a 84 y.o. male Date: 03/28/2018 Primary Care Physican: Noralee Space, MD Primary Cardiologist: Hochrein Electrophysiologist: Faustino Congress Weight:unknown       Call todaughter [name].  Heart Failure questions reviewed, pt asymptomatic.  History of orthostatic hypotension and falls.   Thoracic impedance has improved but remains abnormal suggesting fluid accumulation.   Fluid index still above threshold but slowing trending down.   Prescribed dosage: Furosemide20 mg 1 tabletMonday, Wednesday and Friday.Potassium 20 mEq 1 tablet every Monday, Wednesday and Friday. Per Dr Cherlyn Cushing note 01/17/2018, does not want to increase diuretic due to hypotension and orthostatic issues.  Labs: 02/13/2018 Creatinine 1.15, BUN 24, Potassium 5.1, Sodium 138, EGFR 58-67 02/10/2018 Creatinine 0.98, BUN 23, Potassium 4.0, Sodium 134, EGFR >60  12/16/2017 Creatinine 0.90, BUN 17, Potassium 4.4, Sodium 138 11/13/2017 Creatinine 0.92, BUN 16, Potassium 4.3, Sodium 136, EGFR 76-88 10/30/2017 Creatinine 1.06, BUN 14, Potassium 4.6, Sodium 134, EGFR 64-74 10/17/2017 Creatinine0.94, BUN11, Potassium3.6, Sodium134, EGFR>60 10/16/2017 Creatinine0.87, BUN9, Potassium3.6, Sodium135, EGFR>60 10/15/2017 Creatinine0.89, BUN9, Potassium3.8, Sodium132, EGFR>60 12/24/2018Creatinine 0.85, BUN10, Potassium3.9, Sodium133, EGFR>60  12/23/2018Creatinine 0.87, BUN13, Potassium3.6, Sodium135, EGFR>60 12/22/2018Creatinine 0.91, BUN15, Potassium2.8, Sodium133, EGFR>60  12/21/2018Creatinine 0.95, BUN15, Potassium3.2, Sodium134, EGFR>60  12/20/2018Creatinine 1.03, BUN15, Potassium3.6, Sodium135, EGFR>60  A complete set of results can be found in Results Review.  Recommendations: No changes.   Encouraged to call for fluid symptoms.  Follow-up plan: ICM clinic phone appointment on 04/29/2018.  Last visit with Dr  Caryl Comes was 01/23/2017 and no recall in system.  Copy of ICM check sent to Dr. Caryl Comes.   3 month ICM trend: 03/27/2018    1 Year ICM trend:       [name], RN 03/28/2018 8:30 AM

## 2018-03-31 ENCOUNTER — Encounter: Payer: Self-pay | Admitting: Pulmonary Disease

## 2018-03-31 DIAGNOSIS — K5790 Diverticulosis of intestine, part unspecified, without perforation or abscess without bleeding: Secondary | ICD-10-CM | POA: Diagnosis not present

## 2018-03-31 DIAGNOSIS — I110 Hypertensive heart disease with heart failure: Secondary | ICD-10-CM | POA: Diagnosis not present

## 2018-03-31 DIAGNOSIS — I5043 Acute on chronic combined systolic (congestive) and diastolic (congestive) heart failure: Secondary | ICD-10-CM | POA: Diagnosis not present

## 2018-03-31 DIAGNOSIS — I2510 Atherosclerotic heart disease of native coronary artery without angina pectoris: Secondary | ICD-10-CM | POA: Diagnosis not present

## 2018-03-31 DIAGNOSIS — L89110 Pressure ulcer of right upper back, unstageable: Secondary | ICD-10-CM | POA: Diagnosis not present

## 2018-03-31 DIAGNOSIS — I255 Ischemic cardiomyopathy: Secondary | ICD-10-CM | POA: Diagnosis not present

## 2018-03-31 NOTE — Telephone Encounter (Signed)
Dr. Lenna Gilford, please advise on this, thank you.

## 2018-04-04 ENCOUNTER — Other Ambulatory Visit: Payer: Self-pay | Admitting: Pulmonary Disease

## 2018-04-07 ENCOUNTER — Telehealth: Payer: Self-pay | Admitting: Pulmonary Disease

## 2018-04-07 ENCOUNTER — Encounter: Payer: Self-pay | Admitting: Pulmonary Disease

## 2018-04-07 ENCOUNTER — Ambulatory Visit (INDEPENDENT_AMBULATORY_CARE_PROVIDER_SITE_OTHER): Payer: Medicare Other | Admitting: Pulmonary Disease

## 2018-04-07 VITALS — BP 136/74 | HR 74 | Temp 97.5°F | Ht 71.0 in | Wt 174.0 lb

## 2018-04-07 DIAGNOSIS — L89110 Pressure ulcer of right upper back, unstageable: Secondary | ICD-10-CM | POA: Diagnosis not present

## 2018-04-07 DIAGNOSIS — W19XXXD Unspecified fall, subsequent encounter: Secondary | ICD-10-CM

## 2018-04-07 DIAGNOSIS — I2510 Atherosclerotic heart disease of native coronary artery without angina pectoris: Secondary | ICD-10-CM | POA: Diagnosis not present

## 2018-04-07 DIAGNOSIS — R5381 Other malaise: Secondary | ICD-10-CM | POA: Diagnosis not present

## 2018-04-07 DIAGNOSIS — I255 Ischemic cardiomyopathy: Secondary | ICD-10-CM

## 2018-04-07 DIAGNOSIS — I5043 Acute on chronic combined systolic (congestive) and diastolic (congestive) heart failure: Secondary | ICD-10-CM | POA: Diagnosis not present

## 2018-04-07 DIAGNOSIS — J181 Lobar pneumonia, unspecified organism: Secondary | ICD-10-CM

## 2018-04-07 DIAGNOSIS — J449 Chronic obstructive pulmonary disease, unspecified: Secondary | ICD-10-CM | POA: Diagnosis not present

## 2018-04-07 DIAGNOSIS — I110 Hypertensive heart disease with heart failure: Secondary | ICD-10-CM | POA: Diagnosis not present

## 2018-04-07 DIAGNOSIS — Z95810 Presence of automatic (implantable) cardiac defibrillator: Secondary | ICD-10-CM

## 2018-04-07 DIAGNOSIS — Z9861 Coronary angioplasty status: Secondary | ICD-10-CM

## 2018-04-07 DIAGNOSIS — I272 Pulmonary hypertension, unspecified: Secondary | ICD-10-CM | POA: Diagnosis not present

## 2018-04-07 DIAGNOSIS — M159 Polyosteoarthritis, unspecified: Secondary | ICD-10-CM

## 2018-04-07 DIAGNOSIS — J90 Pleural effusion, not elsewhere classified: Secondary | ICD-10-CM

## 2018-04-07 DIAGNOSIS — I5042 Chronic combined systolic (congestive) and diastolic (congestive) heart failure: Secondary | ICD-10-CM | POA: Diagnosis not present

## 2018-04-07 DIAGNOSIS — M150 Primary generalized (osteo)arthritis: Secondary | ICD-10-CM

## 2018-04-07 DIAGNOSIS — J189 Pneumonia, unspecified organism: Secondary | ICD-10-CM

## 2018-04-07 DIAGNOSIS — K5790 Diverticulosis of intestine, part unspecified, without perforation or abscess without bleeding: Secondary | ICD-10-CM | POA: Diagnosis not present
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
## 2018-04-07 NOTE — Telephone Encounter (Signed)
Order faxed per patient request

## 2018-04-07 NOTE — Telephone Encounter (Signed)
Order was placed at today's OV with Dr. Lenna Gilford for pt to be referred to PT.  Pt wants the referral to be sent to [facility].  Fax number is [phone] and needs to be in attention to Vel[facility] Mediate.  Routing to [facility] pool.

## 2018-04-07 NOTE — Patient Instructions (Signed)
Today we updated your med list in our EPIC system...    Continue your current medications the same...  We discussed OUTPATIENT Physical Therapy to help w/ strength, balance, and ambulation...  .stay as active as possible...  Call for any questions...  Let's plan a follow up visit in 2-41mo, sooner if needed for problems.Marland KitchenMarland Kitchen

## 2018-04-10 DIAGNOSIS — M6281 Muscle weakness (generalized): Secondary | ICD-10-CM | POA: Diagnosis not present

## 2018-04-10 DIAGNOSIS — Z9181 History of falling: Secondary | ICD-10-CM | POA: Diagnosis not present

## 2018-04-10 DIAGNOSIS — R293 Abnormal posture: Secondary | ICD-10-CM | POA: Diagnosis not present

## 2018-04-10 DIAGNOSIS — R262 Difficulty in walking, not elsewhere classified: Secondary | ICD-10-CM | POA: Diagnosis not present

## 2018-04-14 DIAGNOSIS — R262 Difficulty in walking, not elsewhere classified: Secondary | ICD-10-CM | POA: Diagnosis not present

## 2018-04-14 DIAGNOSIS — M6281 Muscle weakness (generalized): Secondary | ICD-10-CM | POA: Diagnosis not present

## 2018-04-14 DIAGNOSIS — Z9181 History of falling: Secondary | ICD-10-CM | POA: Diagnosis not present

## 2018-04-14 DIAGNOSIS — R293 Abnormal posture: Secondary | ICD-10-CM | POA: Diagnosis not present

## 2018-04-17 DIAGNOSIS — M6281 Muscle weakness (generalized): Secondary | ICD-10-CM | POA: Diagnosis not present

## 2018-04-17 DIAGNOSIS — Z9181 History of falling: Secondary | ICD-10-CM | POA: Diagnosis not present

## 2018-04-17 DIAGNOSIS — R262 Difficulty in walking, not elsewhere classified: Secondary | ICD-10-CM | POA: Diagnosis not present

## 2018-04-17 DIAGNOSIS — R293 Abnormal posture: Secondary | ICD-10-CM | POA: Diagnosis not present

## 2018-04-18 DIAGNOSIS — Z9181 History of falling: Secondary | ICD-10-CM | POA: Diagnosis not present

## 2018-04-18 DIAGNOSIS — R262 Difficulty in walking, not elsewhere classified: Secondary | ICD-10-CM | POA: Diagnosis not present

## 2018-04-18 DIAGNOSIS — R293 Abnormal posture: Secondary | ICD-10-CM | POA: Diagnosis not present

## 2018-04-18 DIAGNOSIS — M6281 Muscle weakness (generalized): Secondary | ICD-10-CM | POA: Diagnosis not present

## 2018-04-19 ENCOUNTER — Other Ambulatory Visit: Payer: Self-pay | Admitting: Pulmonary Disease

## 2018-04-21 DIAGNOSIS — R262 Difficulty in walking, not elsewhere classified: Secondary | ICD-10-CM | POA: Diagnosis not present

## 2018-04-21 DIAGNOSIS — R293 Abnormal posture: Secondary | ICD-10-CM | POA: Diagnosis not present

## 2018-04-21 DIAGNOSIS — M6281 Muscle weakness (generalized): Secondary | ICD-10-CM | POA: Diagnosis not present

## 2018-04-21 DIAGNOSIS — Z9181 History of falling: Secondary | ICD-10-CM | POA: Diagnosis not present

## 2018-04-23 DIAGNOSIS — R293 Abnormal posture: Secondary | ICD-10-CM | POA: Diagnosis not present

## 2018-04-23 DIAGNOSIS — R262 Difficulty in walking, not elsewhere classified: Secondary | ICD-10-CM | POA: Diagnosis not present

## 2018-04-23 DIAGNOSIS — Z9181 History of falling: Secondary | ICD-10-CM | POA: Diagnosis not present

## 2018-04-23 DIAGNOSIS — M6281 Muscle weakness (generalized): Secondary | ICD-10-CM | POA: Diagnosis not present

## 2018-04-25 DIAGNOSIS — Z9181 History of falling: Secondary | ICD-10-CM | POA: Diagnosis not present

## 2018-04-25 DIAGNOSIS — R293 Abnormal posture: Secondary | ICD-10-CM | POA: Diagnosis not present

## 2018-04-25 DIAGNOSIS — R262 Difficulty in walking, not elsewhere classified: Secondary | ICD-10-CM | POA: Diagnosis not present

## 2018-04-25 DIAGNOSIS — M6281 Muscle weakness (generalized): Secondary | ICD-10-CM | POA: Diagnosis not present

## 2018-04-26 ENCOUNTER — Other Ambulatory Visit: Payer: Self-pay | Admitting: Pulmonary Disease

## 2018-04-26 ENCOUNTER — Other Ambulatory Visit: Payer: Self-pay | Admitting: Cardiology

## 2018-04-28 DIAGNOSIS — Z9181 History of falling: Secondary | ICD-10-CM | POA: Diagnosis not present

## 2018-04-28 DIAGNOSIS — M6281 Muscle weakness (generalized): Secondary | ICD-10-CM | POA: Diagnosis not present

## 2018-04-28 DIAGNOSIS — R293 Abnormal posture: Secondary | ICD-10-CM | POA: Diagnosis not present

## 2018-04-28 DIAGNOSIS — R262 Difficulty in walking, not elsewhere classified: Secondary | ICD-10-CM | POA: Diagnosis not present

## 2018-04-29 ENCOUNTER — Ambulatory Visit (INDEPENDENT_AMBULATORY_CARE_PROVIDER_SITE_OTHER): Payer: Medicare Other

## 2018-04-29 ENCOUNTER — Ambulatory Visit (INDEPENDENT_AMBULATORY_CARE_PROVIDER_SITE_OTHER): Payer: Medicare Other | Admitting: *Deleted

## 2018-04-29 DIAGNOSIS — Z95810 Presence of automatic (implantable) cardiac defibrillator: Secondary | ICD-10-CM

## 2018-04-29 DIAGNOSIS — I5042 Chronic combined systolic (congestive) and diastolic (congestive) heart failure: Secondary | ICD-10-CM

## 2018-04-29 DIAGNOSIS — I255 Ischemic cardiomyopathy: Secondary | ICD-10-CM | POA: Diagnosis not present

## 2018-04-29 NOTE — Progress Notes (Signed)
Remote ICD transmission.   

## 2018-04-29 NOTE — Progress Notes (Signed)
EPIC Encounter for ICM Monitoring  Patient Name: [name] is a 84 y.o. male Date: 04/29/2018 Primary Care Physican: Noralee Space, MD Primary Cardiologist: [place] Electrophysiologist: Faustino Congress Weight:unknown                                                   Attempted call todaughter [name].   Left detailed message, per DPR, regarding transmission.  Transmission reviewed.  History of orthostatic hypotension and falls.   Thoracic impedance returned to normal after being abnormal suggesting fluid since 01/29/2018.  Prescribed dosage: Furosemide20 mg 1 tabletMonday, Wednesday and Friday.Potassium 20 mEq 1 tablet every Monday, Wednesday and Friday.   Labs: 02/13/2018 Cory Roughen, Potassium5.1, [id number], [id number] 02/10/2018 Creatinine0.98, BUN23, Potassium4.0, Sodium134, EGFR>60 12/16/2017 Creatinine 0.90, BUN 17, Potassium 4.4, Sodium 138 11/13/2017 Creatinine 0.92, BUN 16, Potassium 4.3, Sodium 136, EGFR 76-88 10/30/2017 Creatinine 1.06, BUN 14, Potassium 4.6, Sodium 134, EGFR 64-74 10/17/2017 Creatinine0.94, BUN11, Potassium3.6, Sodium134, EGFR>60 10/16/2017 Creatinine0.87, BUN9, Potassium3.6, Sodium135, EGFR>60 10/15/2017 Creatinine0.89, BUN9, Potassium3.8, Sodium132, EGFR>60 12/24/2018Creatinine 0.85, BUN10, Potassium3.9, Sodium133, EGFR>60  12/23/2018Creatinine 0.87, BUN13, Potassium3.6, Sodium135, EGFR>60 12/22/2018Creatinine 0.91, BUN15, Potassium2.8, Sodium133, EGFR>60  12/21/2018Creatinine 0.95, BUN15, Potassium3.2, Sodium134, EGFR>60  12/20/2018Creatinine 1.03, BUN15, Potassium3.6, Sodium135, EGFR>60  A complete set of results can be found in Results Review.  Recommendations: Left voice mail with ICM number and encouraged to call if experiencing any fluid symptoms.  Follow-up plan: ICM clinic phone appointment on 05/30/2018.   Last visit with Dr Caryl Comes was 01/23/2017 and no recall in  system.  Copy of ICM check sent to Dr. Caryl Comes.   3 month ICM trend: 04/29/2018    1 Year ICM trend:       [name], RN 04/29/2018 11:24 AM

## 2018-04-30 DIAGNOSIS — M6281 Muscle weakness (generalized): Secondary | ICD-10-CM | POA: Diagnosis not present

## 2018-04-30 DIAGNOSIS — Z9181 History of falling: Secondary | ICD-10-CM | POA: Diagnosis not present

## 2018-04-30 DIAGNOSIS — R262 Difficulty in walking, not elsewhere classified: Secondary | ICD-10-CM | POA: Diagnosis not present

## 2018-04-30 DIAGNOSIS — R293 Abnormal posture: Secondary | ICD-10-CM | POA: Diagnosis not present

## 2018-05-02 DIAGNOSIS — Z9181 History of falling: Secondary | ICD-10-CM | POA: Diagnosis not present

## 2018-05-02 DIAGNOSIS — R293 Abnormal posture: Secondary | ICD-10-CM | POA: Diagnosis not present

## 2018-05-02 DIAGNOSIS — M6281 Muscle weakness (generalized): Secondary | ICD-10-CM | POA: Diagnosis not present

## 2018-05-02 DIAGNOSIS — R262 Difficulty in walking, not elsewhere classified: Secondary | ICD-10-CM | POA: Diagnosis not present

## 2018-05-05 DIAGNOSIS — M6281 Muscle weakness (generalized): Secondary | ICD-10-CM | POA: Diagnosis not present

## 2018-05-05 DIAGNOSIS — R262 Difficulty in walking, not elsewhere classified: Secondary | ICD-10-CM | POA: Diagnosis not present

## 2018-05-05 DIAGNOSIS — Z9181 History of falling: Secondary | ICD-10-CM | POA: Diagnosis not present

## 2018-05-05 DIAGNOSIS — R293 Abnormal posture: Secondary | ICD-10-CM | POA: Diagnosis not present

## 2018-05-07 DIAGNOSIS — M6281 Muscle weakness (generalized): Secondary | ICD-10-CM | POA: Diagnosis not present

## 2018-05-07 DIAGNOSIS — Z9181 History of falling: Secondary | ICD-10-CM | POA: Diagnosis not present

## 2018-05-07 DIAGNOSIS — R262 Difficulty in walking, not elsewhere classified: Secondary | ICD-10-CM | POA: Diagnosis not present

## 2018-05-07 DIAGNOSIS — R293 Abnormal posture: Secondary | ICD-10-CM | POA: Diagnosis not present

## 2018-05-10 ENCOUNTER — Other Ambulatory Visit: Payer: Self-pay | Admitting: Cardiology

## 2018-05-10 ENCOUNTER — Other Ambulatory Visit: Payer: Self-pay | Admitting: Pulmonary Disease

## 2018-05-12 DIAGNOSIS — Z9181 History of falling: Secondary | ICD-10-CM | POA: Diagnosis not present

## 2018-05-12 DIAGNOSIS — R293 Abnormal posture: Secondary | ICD-10-CM | POA: Diagnosis not present

## 2018-05-12 DIAGNOSIS — M6281 Muscle weakness (generalized): Secondary | ICD-10-CM | POA: Diagnosis not present

## 2018-05-12 DIAGNOSIS — R262 Difficulty in walking, not elsewhere classified: Secondary | ICD-10-CM | POA: Diagnosis not present

## 2018-05-12 NOTE — Telephone Encounter (Signed)
Rx sent to pharmacy   

## 2018-05-14 ENCOUNTER — Telehealth: Payer: Self-pay | Admitting: Rheumatology

## 2018-05-14 DIAGNOSIS — Z9181 History of falling: Secondary | ICD-10-CM | POA: Diagnosis not present

## 2018-05-14 DIAGNOSIS — R262 Difficulty in walking, not elsewhere classified: Secondary | ICD-10-CM | POA: Diagnosis not present

## 2018-05-14 DIAGNOSIS — M6281 Muscle weakness (generalized): Secondary | ICD-10-CM | POA: Diagnosis not present

## 2018-05-14 DIAGNOSIS — R293 Abnormal posture: Secondary | ICD-10-CM | POA: Diagnosis not present

## 2018-05-14 LAB — CUP PACEART REMOTE DEVICE CHECK
Battery Remaining Longevity: 95 mo
Battery Voltage: 3 V
Brady Statistic AP VS Percent: 99.6 %
Brady Statistic AS VS Percent: 0.1 %
Date Time Interrogation Session: 20190709052503
HighPow Impedance: 41 Ohm
HighPow Impedance: 56 Ohm
Implantable Lead Implant Date: 20090623
Implantable Lead Location: 753859
Implantable Lead Location: 753860
Implantable Pulse Generator Implant Date: 20170616
Lead Channel Impedance Value: 361 Ohm
Lead Channel Impedance Value: 399 Ohm
Lead Channel Pacing Threshold Amplitude: 0.875 V
Lead Channel Pacing Threshold Pulse Width: 0.4 ms
Lead Channel Pacing Threshold Pulse Width: 0.4 ms
Lead Channel Sensing Intrinsic Amplitude: 1 mV
MDC IDC LEAD IMPLANT DT: 20090623
MDC IDC MSMT LEADCHNL RA SENSING INTR AMPL: 1 mV
MDC IDC MSMT LEADCHNL RV IMPEDANCE VALUE: 304 Ohm
MDC IDC MSMT LEADCHNL RV PACING THRESHOLD AMPLITUDE: 0.875 V
MDC IDC MSMT LEADCHNL RV SENSING INTR AMPL: 7.5 mV
MDC IDC MSMT LEADCHNL RV SENSING INTR AMPL: 7.5 mV
MDC IDC SET LEADCHNL RA PACING AMPLITUDE: 2 V
MDC IDC SET LEADCHNL RV PACING AMPLITUDE: 2.5 V
MDC IDC SET LEADCHNL RV PACING PULSEWIDTH: 0.4 ms
MDC IDC SET LEADCHNL RV SENSING SENSITIVITY: 0.6 mV
MDC IDC STAT BRADY AP VP PERCENT: 0.31 %
MDC IDC STAT BRADY AS VP PERCENT: 0 %
MDC IDC STAT BRADY RA PERCENT PACED: 99.9 %
MDC IDC STAT BRADY RV PERCENT PACED: 0.31 %

## 2018-05-14 NOTE — Telephone Encounter (Signed)
Patient's daughter [name] called stating her dad received a bill from [place] for [place] on 02/13/18.  Medicare which is the primary,as well as the secondary insurance both denied payment.  Medicare stated that "proper documentation why these labs were medically necessary was not submitted."  [name] states the bill is for [phone] and is requesting a return call.

## 2018-05-19 ENCOUNTER — Encounter: Payer: Self-pay | Admitting: Cardiology

## 2018-05-19 DIAGNOSIS — R293 Abnormal posture: Secondary | ICD-10-CM | POA: Diagnosis not present

## 2018-05-19 DIAGNOSIS — R262 Difficulty in walking, not elsewhere classified: Secondary | ICD-10-CM | POA: Diagnosis not present

## 2018-05-19 DIAGNOSIS — M6281 Muscle weakness (generalized): Secondary | ICD-10-CM | POA: Diagnosis not present

## 2018-05-19 DIAGNOSIS — Z9181 History of falling: Secondary | ICD-10-CM | POA: Diagnosis not present

## 2018-05-20 NOTE — Telephone Encounter (Signed)
Spoke with [name] and advised that we have looked into this. Advised that we will resubmitted with additional ICD 10 codes. Advised tha and ABN has been signed so whatever is not cover he will be responsible for. [name] verbalized understanding.

## 2018-05-21 DIAGNOSIS — R293 Abnormal posture: Secondary | ICD-10-CM | POA: Diagnosis not present

## 2018-05-21 DIAGNOSIS — M6281 Muscle weakness (generalized): Secondary | ICD-10-CM | POA: Diagnosis not present

## 2018-05-21 DIAGNOSIS — R262 Difficulty in walking, not elsewhere classified: Secondary | ICD-10-CM | POA: Diagnosis not present

## 2018-05-21 DIAGNOSIS — Z9181 History of falling: Secondary | ICD-10-CM | POA: Diagnosis not present

## 2018-05-24 ENCOUNTER — Other Ambulatory Visit: Payer: Self-pay | Admitting: Acute Care

## 2018-05-26 DIAGNOSIS — R262 Difficulty in walking, not elsewhere classified: Secondary | ICD-10-CM | POA: Diagnosis not present

## 2018-05-26 DIAGNOSIS — R293 Abnormal posture: Secondary | ICD-10-CM | POA: Diagnosis not present

## 2018-05-26 DIAGNOSIS — M6281 Muscle weakness (generalized): Secondary | ICD-10-CM | POA: Diagnosis not present

## 2018-05-26 DIAGNOSIS — Z9181 History of falling: Secondary | ICD-10-CM | POA: Diagnosis not present

## 2018-05-27 DIAGNOSIS — B0229 Other postherpetic nervous system involvement: Secondary | ICD-10-CM | POA: Diagnosis not present

## 2018-05-27 DIAGNOSIS — Z85828 Personal history of other malignant neoplasm of skin: Secondary | ICD-10-CM | POA: Diagnosis not present

## 2018-05-27 DIAGNOSIS — X32XXXD Exposure to sunlight, subsequent encounter: Secondary | ICD-10-CM | POA: Diagnosis not present

## 2018-05-27 DIAGNOSIS — Z08 Encounter for follow-up examination after completed treatment for malignant neoplasm: Secondary | ICD-10-CM | POA: Diagnosis not present

## 2018-05-27 DIAGNOSIS — L570 Actinic keratosis: Secondary | ICD-10-CM | POA: Diagnosis not present

## 2018-05-28 DIAGNOSIS — R262 Difficulty in walking, not elsewhere classified: Secondary | ICD-10-CM | POA: Diagnosis not present

## 2018-05-28 DIAGNOSIS — Z9181 History of falling: Secondary | ICD-10-CM | POA: Diagnosis not present

## 2018-05-28 DIAGNOSIS — M6281 Muscle weakness (generalized): Secondary | ICD-10-CM | POA: Diagnosis not present

## 2018-05-28 DIAGNOSIS — R293 Abnormal posture: Secondary | ICD-10-CM | POA: Diagnosis not present

## 2018-05-30 ENCOUNTER — Ambulatory Visit (INDEPENDENT_AMBULATORY_CARE_PROVIDER_SITE_OTHER): Payer: Medicare Other

## 2018-05-30 DIAGNOSIS — I5042 Chronic combined systolic (congestive) and diastolic (congestive) heart failure: Secondary | ICD-10-CM | POA: Diagnosis not present

## 2018-05-30 DIAGNOSIS — I5043 Acute on chronic combined systolic (congestive) and diastolic (congestive) heart failure: Secondary | ICD-10-CM

## 2018-05-30 DIAGNOSIS — Z95810 Presence of automatic (implantable) cardiac defibrillator: Secondary | ICD-10-CM | POA: Diagnosis not present

## 2018-05-30 NOTE — Progress Notes (Signed)
EPIC Encounter for ICM Monitoring  Patient Name: [name] is a 84 y.o. male Date: 05/30/2018 Primary Care Physican: Noralee Space, MD Primary Cardiologist: Hochrein Electrophysiologist: Faustino Congress Weight:unknown      Attempted call todaughter [name].  Left detailed message.  Transmission reviewed.   Thoracic impedance normal.  Prescribed dosage: Furosemide20 mg 1 tabletMonday, Wednesday and Friday.Potassium 20 mEq 1 tablet every Monday, Wednesday and Friday.   Labs: 02/13/2018 [name], Potassium5.1, [id number], [id number] 02/10/2018 Creatinine0.98, BUN23, Potassium4.0, Sodium134, EGFR>60 12/16/2017 Creatinine 0.90, BUN 17, Potassium 4.4, Sodium 138 11/13/2017 Creatinine 0.92, BUN 16, Potassium 4.3, Sodium 136, EGFR 76-88 10/30/2017 Creatinine 1.06, BUN 14, Potassium 4.6, Sodium 134, EGFR 64-74 10/17/2017 Creatinine0.94, BUN11, Potassium3.6, Sodium134, EGFR>60 10/16/2017 Creatinine0.87, BUN9, Potassium3.6, Sodium135, EGFR>60 10/15/2017 Creatinine0.89, BUN9, Potassium3.8, Sodium132, EGFR>60 12/24/2018Creatinine 0.85, BUN10, Potassium3.9, Sodium133, EGFR>60  12/23/2018Creatinine 0.87, BUN13, Potassium3.6, Sodium135, EGFR>60 12/22/2018Creatinine 0.91, BUN15, Potassium2.8, Sodium133, EGFR>60  12/21/2018Creatinine 0.95, BUN15, Potassium3.2, Sodium134, EGFR>60  12/20/2018Creatinine 1.03, BUN15, Potassium3.6, Sodium135, EGFR>60  A complete set of results can be found in Results Review.  Recommendations: Left voice mail with ICM number and encouraged to call if experiencing any fluid symptoms.  Follow-up plan: ICM clinic phone appointment on 06/30/2018.   Office appointment scheduled 06/10/2018 with Jesse Ransom, PA.    Copy of ICM check sent to Dr. Caryl [name].   3 month ICM trend: 05/30/2018    1 Year ICM trend:       Jesse Billings, RN 05/30/2018 8:01 AM

## 2018-05-31 ENCOUNTER — Other Ambulatory Visit: Payer: Self-pay | Admitting: Cardiology

## 2018-06-02 DIAGNOSIS — Z9181 History of falling: Secondary | ICD-10-CM | POA: Diagnosis not present

## 2018-06-02 DIAGNOSIS — R262 Difficulty in walking, not elsewhere classified: Secondary | ICD-10-CM | POA: Diagnosis not present

## 2018-06-02 DIAGNOSIS — R293 Abnormal posture: Secondary | ICD-10-CM | POA: Diagnosis not present

## 2018-06-02 DIAGNOSIS — M6281 Muscle weakness (generalized): Secondary | ICD-10-CM | POA: Diagnosis not present

## 2018-06-03 ENCOUNTER — Other Ambulatory Visit: Payer: Self-pay | Admitting: Cardiology

## 2018-06-04 DIAGNOSIS — Z9181 History of falling: Secondary | ICD-10-CM | POA: Diagnosis not present

## 2018-06-04 DIAGNOSIS — R293 Abnormal posture: Secondary | ICD-10-CM | POA: Diagnosis not present

## 2018-06-04 DIAGNOSIS — R262 Difficulty in walking, not elsewhere classified: Secondary | ICD-10-CM | POA: Diagnosis not present

## 2018-06-04 DIAGNOSIS — M6281 Muscle weakness (generalized): Secondary | ICD-10-CM | POA: Diagnosis not present

## 2018-06-07 ENCOUNTER — Other Ambulatory Visit: Payer: Self-pay | Admitting: Pulmonary Disease

## 2018-06-10 ENCOUNTER — Ambulatory Visit (INDEPENDENT_AMBULATORY_CARE_PROVIDER_SITE_OTHER): Payer: Medicare Other | Admitting: Cardiology

## 2018-06-10 ENCOUNTER — Encounter: Payer: Self-pay | Admitting: Cardiology

## 2018-06-10 VITALS — BP 110/70 | HR 77 | Ht 71.0 in | Wt 176.0 lb

## 2018-06-10 DIAGNOSIS — Z95810 Presence of automatic (implantable) cardiac defibrillator: Secondary | ICD-10-CM

## 2018-06-10 DIAGNOSIS — Z9861 Coronary angioplasty status: Secondary | ICD-10-CM | POA: Diagnosis not present

## 2018-06-10 DIAGNOSIS — I2510 Atherosclerotic heart disease of native coronary artery without angina pectoris: Secondary | ICD-10-CM | POA: Diagnosis not present

## 2018-06-10 DIAGNOSIS — I255 Ischemic cardiomyopathy: Secondary | ICD-10-CM | POA: Diagnosis not present

## 2018-06-10 DIAGNOSIS — I5042 Chronic combined systolic (congestive) and diastolic (congestive) heart failure: Secondary | ICD-10-CM | POA: Diagnosis not present

## 2018-06-10 DIAGNOSIS — I951 Orthostatic hypotension: Secondary | ICD-10-CM | POA: Diagnosis not present
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
## 2018-06-10 NOTE — Patient Instructions (Signed)
Medication Instructions:  No changes.  If you need a refill on your cardiac medications before your next appointment, please call your pharmacy.  Labwork: None Ordered.  Testing/Procedures: None Ordered.  Special Instructions: Start taking BP while sitting.  Follow-Up: Your physician wants you to follow-up in: 6 Months with Dr.Hochrein. You should receive a reminder letter in the mail two months in advance, and a re-call reminder.    Thank you for choosing [facility] at [facility]!!

## 2018-06-11 DIAGNOSIS — M6281 Muscle weakness (generalized): Secondary | ICD-10-CM | POA: Diagnosis not present

## 2018-06-11 DIAGNOSIS — R293 Abnormal posture: Secondary | ICD-10-CM | POA: Diagnosis not present

## 2018-06-11 DIAGNOSIS — Z9181 History of falling: Secondary | ICD-10-CM | POA: Diagnosis not present

## 2018-06-11 DIAGNOSIS — R262 Difficulty in walking, not elsewhere classified: Secondary | ICD-10-CM | POA: Diagnosis not present

## 2018-06-13 ENCOUNTER — Telehealth: Payer: Self-pay | Admitting: Cardiology

## 2018-06-13 NOTE — Telephone Encounter (Signed)
Called daughter and left a VM to call back to schedule 3 month followup with Dr. Percival Spanish.

## 2018-06-14 ENCOUNTER — Other Ambulatory Visit: Payer: Self-pay | Admitting: Pulmonary Disease

## 2018-06-18 DIAGNOSIS — Z9181 History of falling: Secondary | ICD-10-CM | POA: Diagnosis not present

## 2018-06-18 DIAGNOSIS — M6281 Muscle weakness (generalized): Secondary | ICD-10-CM | POA: Diagnosis not present

## 2018-06-18 DIAGNOSIS — R293 Abnormal posture: Secondary | ICD-10-CM | POA: Diagnosis not present

## 2018-06-18 DIAGNOSIS — R262 Difficulty in walking, not elsewhere classified: Secondary | ICD-10-CM | POA: Diagnosis not present

## 2018-06-21 ENCOUNTER — Other Ambulatory Visit: Payer: Self-pay | Admitting: Pulmonary Disease

## 2018-06-25 ENCOUNTER — Encounter: Payer: Self-pay | Admitting: Pulmonary Disease

## 2018-06-25 DIAGNOSIS — J449 Chronic obstructive pulmonary disease, unspecified: Secondary | ICD-10-CM

## 2018-06-25 NOTE — Telephone Encounter (Signed)
Hi Dr. Lenna Gilford --This is [name]'s daughter, [name].We're taking a trip to [place] to visit my son at the end of the month.Dad will be flying with [name], so I assume that bringing along the oxygen won't be possible.Dad will be away from home for 3 nights and I'm wondering if you think we should arrange to have oxygen delivered the the house where we're staying for his night use or if going without it for a few nights is okay? Thank you! [name]   SN please advise. Thanks

## 2018-06-28 ENCOUNTER — Other Ambulatory Visit: Payer: Self-pay | Admitting: Pulmonary Disease

## 2018-06-30 ENCOUNTER — Ambulatory Visit (INDEPENDENT_AMBULATORY_CARE_PROVIDER_SITE_OTHER): Payer: Medicare Other

## 2018-06-30 DIAGNOSIS — Z95810 Presence of automatic (implantable) cardiac defibrillator: Secondary | ICD-10-CM | POA: Diagnosis not present

## 2018-06-30 DIAGNOSIS — I5042 Chronic combined systolic (congestive) and diastolic (congestive) heart failure: Secondary | ICD-10-CM | POA: Diagnosis not present

## 2018-07-01 DIAGNOSIS — C44219 Basal cell carcinoma of skin of left ear and external auricular canal: Secondary | ICD-10-CM | POA: Diagnosis not present

## 2018-07-01 DIAGNOSIS — D0421 Carcinoma in situ of skin of right ear and external auricular canal: Secondary | ICD-10-CM | POA: Diagnosis not present

## 2018-07-01 NOTE — Progress Notes (Signed)
EPIC Encounter for ICM Monitoring  Patient Name: [name] is a 84 y.o. male Date: 07/01/2018 Primary Care Physican: Noralee Space, MD Primary Cardiologist: Hochrein Electrophysiologist: Faustino Congress Weight:unknown                                                 Call todaughter [name]. Heart Failure questions reviewed, pt asymptomatic.   Thoracic impedance normal.  Prescribed dosage: Furosemide20 mg 1 tabletMonday, Wednesday and Friday.Potassium 20 mEq 1 tablet every Monday, Wednesday and Friday.   Labs: 02/13/2018 Creatinine1.15Steele Berg [id number], [id number] 02/10/2018 Creatinine0.98, BUN23, Potassium4.0, [id number], EGFR>60 12/16/2017 Creatinine 0.90, BUN 17, Potassium 4.4, Sodium 138 11/13/2017 Creatinine 0.92, BUN 16, Potassium 4.3, Sodium 136, EGFR 76-88 10/30/2017 Creatinine 1.06, BUN 14, Potassium 4.6, Sodium 134, EGFR 64-74 A complete set of results can be found in Results Review.  Recommendations: No changes.   Encouraged to call for fluid symptoms.  Follow-up plan: ICM clinic phone appointment on 07/31/2018.    Copy of ICM check sent to Dr. Caryl Comes.   3 month ICM trend: 06/30/2018    1 Year ICM trend:       [name], RN 07/01/2018 10:00 AM

## 2018-07-03 ENCOUNTER — Encounter: Payer: Self-pay | Admitting: Pulmonary Disease

## 2018-07-10 DIAGNOSIS — J449 Chronic obstructive pulmonary disease, unspecified: Secondary | ICD-10-CM | POA: Diagnosis not present

## 2018-07-10 DIAGNOSIS — R0902 Hypoxemia: Secondary | ICD-10-CM | POA: Diagnosis not present

## 2018-07-15 ENCOUNTER — Ambulatory Visit (INDEPENDENT_AMBULATORY_CARE_PROVIDER_SITE_OTHER): Payer: Medicare Other | Admitting: Pulmonary Disease

## 2018-07-15 VITALS — BP 116/64 | HR 79 | Temp 98.1°F | Ht 71.0 in | Wt 178.0 lb

## 2018-07-15 DIAGNOSIS — M159 Polyosteoarthritis, unspecified: Secondary | ICD-10-CM

## 2018-07-15 DIAGNOSIS — I739 Peripheral vascular disease, unspecified: Secondary | ICD-10-CM | POA: Diagnosis not present

## 2018-07-15 DIAGNOSIS — I255 Ischemic cardiomyopathy: Secondary | ICD-10-CM | POA: Diagnosis not present

## 2018-07-15 DIAGNOSIS — J449 Chronic obstructive pulmonary disease, unspecified: Secondary | ICD-10-CM | POA: Diagnosis not present

## 2018-07-15 DIAGNOSIS — R5381 Other malaise: Secondary | ICD-10-CM | POA: Diagnosis not present

## 2018-07-15 DIAGNOSIS — Z8701 Personal history of pneumonia (recurrent): Secondary | ICD-10-CM | POA: Diagnosis not present

## 2018-07-15 DIAGNOSIS — I5042 Chronic combined systolic (congestive) and diastolic (congestive) heart failure: Secondary | ICD-10-CM

## 2018-07-15 DIAGNOSIS — M150 Primary generalized (osteo)arthritis: Secondary | ICD-10-CM

## 2018-07-15 DIAGNOSIS — W19XXXD Unspecified fall, subsequent encounter: Secondary | ICD-10-CM

## 2018-07-15 DIAGNOSIS — I712 Thoracic aortic aneurysm, without rupture, unspecified: Secondary | ICD-10-CM

## 2018-07-15 DIAGNOSIS — I2510 Atherosclerotic heart disease of native coronary artery without angina pectoris: Secondary | ICD-10-CM | POA: Diagnosis not present

## 2018-07-15 DIAGNOSIS — I272 Pulmonary hypertension, unspecified: Secondary | ICD-10-CM | POA: Diagnosis not present

## 2018-07-15 DIAGNOSIS — J90 Pleural effusion, not elsewhere classified: Secondary | ICD-10-CM | POA: Diagnosis not present

## 2018-07-15 DIAGNOSIS — Z95810 Presence of automatic (implantable) cardiac defibrillator: Secondary | ICD-10-CM | POA: Diagnosis not present

## 2018-07-15 DIAGNOSIS — Z9861 Coronary angioplasty status: Secondary | ICD-10-CM

## 2018-07-15 MED ORDER — AZITHROMYCIN 250 MG PO TABS: ORAL_TABLET | ORAL | 0 refills | Status: DC

## 2018-07-15 MED ORDER — METHYLPREDNISOLONE 4 MG PO TBPK: ORAL_TABLET | ORAL | 0 refills | Status: DC

## 2018-07-15 NOTE — Patient Instructions (Signed)
Today we updated your med list in our EPIC system...    Continue your current medications the same...  For your upper resp infection>    I have prescribed a ZPak & a Medrol dosepak => take both as directed starting today until they are gone...  Your overnight oximetry test showed [place] that you still need the oxygen at night...    We will contact [place] to arrange for an affiliate to supple an oxygen concentrator for you in [place] during your up-coming trip...  I am concerned about the open sore behind your left ear...    Please call me after you follow up w/ DrHall so we can be sure that this is taken care of sooner rather than later,,,    I will also check on the status of our order for the Adult Flu shots at that time...  Call for any questions or if I can be of service in any way...  Let's plan a follow up visit in about 3 mo, sooner if needed for problems.Marland KitchenMarland Kitchen

## 2018-07-16 ENCOUNTER — Other Ambulatory Visit: Payer: Self-pay | Admitting: Pulmonary Disease

## 2018-07-16 ENCOUNTER — Telehealth: Payer: Self-pay | Admitting: Pulmonary Disease

## 2018-07-16 MED ORDER — AMOXICILLIN-POT CLAVULANATE 875-125 MG PO TABS: 1.0000 | ORAL_TABLET | Freq: Two times a day (BID) | ORAL | 0 refills | Status: DC

## 2018-07-16 NOTE — Telephone Encounter (Signed)
Received fax from [facility] needing confirmation from SN about prescribed Z pak 07/15/18, because the Patient is currently on Amiodarone.  Per SN- ok to change to Augmentin 875mg , #14, take 1 BID until gone.  [place], spoke with Rise Paganini, Pharmacist.  Explained faxed received and SN ok for medication change to Augmentin 875mg .  Rise Paganini stated that Zpak was picked up by Patient/Patient family yesterday.  She was unsure how prescription was filled without confirmation from SN. She was going to call patient re guarding medication concerns.

## 2018-07-16 NOTE — Telephone Encounter (Signed)
Spoke with pt's daughter, [name]. States that they received a call from the pt's pharmacy and their is an interaction with Amiodarone and Prednisone. Per Lisa's documentation from earlier today, she received a fax from [facility] about an interaction with Amiodarone and Zpack. SN changed the antibiotic and Lattie Haw called this in. Pt's daughter is aware of this. Nothing further was needed at this time.

## 2018-07-18 ENCOUNTER — Encounter: Payer: Self-pay | Admitting: Pulmonary Disease
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
## 2018-07-20 ENCOUNTER — Other Ambulatory Visit: Payer: Self-pay | Admitting: Pulmonary Disease

## 2018-07-21 ENCOUNTER — Encounter (HOSPITAL_COMMUNITY): Payer: Self-pay | Admitting: Emergency Medicine

## 2018-07-21 ENCOUNTER — Inpatient Hospital Stay (HOSPITAL_COMMUNITY)
Admission: EM | Admit: 2018-07-21 | Discharge: 2018-07-24 | DRG: 378 | Disposition: A | Discharge status: Home Health | Payer: Medicare Other | Attending: Internal Medicine | Admitting: Internal Medicine

## 2018-07-21 ENCOUNTER — Emergency Department (HOSPITAL_COMMUNITY): Payer: Medicare Other

## 2018-07-21 ENCOUNTER — Inpatient Hospital Stay (HOSPITAL_COMMUNITY): Payer: Medicare Other

## 2018-07-21 ENCOUNTER — Other Ambulatory Visit: Payer: Self-pay

## 2018-07-21 DIAGNOSIS — W01198A Fall on same level from slipping, tripping and stumbling with subsequent striking against other object, initial encounter: Secondary | ICD-10-CM | POA: Diagnosis present

## 2018-07-21 DIAGNOSIS — M4802 Spinal stenosis, cervical region: Secondary | ICD-10-CM | POA: Diagnosis present

## 2018-07-21 DIAGNOSIS — K5731 Diverticulosis of large intestine without perforation or abscess with bleeding: Secondary | ICD-10-CM

## 2018-07-21 DIAGNOSIS — E7800 Pure hypercholesterolemia, unspecified: Secondary | ICD-10-CM | POA: Diagnosis present

## 2018-07-21 DIAGNOSIS — Z66 Do not resuscitate: Secondary | ICD-10-CM | POA: Diagnosis present

## 2018-07-21 DIAGNOSIS — I428 Other cardiomyopathies: Secondary | ICD-10-CM | POA: Diagnosis present

## 2018-07-21 DIAGNOSIS — I5042 Chronic combined systolic (congestive) and diastolic (congestive) heart failure: Secondary | ICD-10-CM | POA: Diagnosis not present

## 2018-07-21 DIAGNOSIS — K5791 Diverticulosis of intestine, part unspecified, without perforation or abscess with bleeding: Secondary | ICD-10-CM | POA: Diagnosis present

## 2018-07-21 DIAGNOSIS — Z955 Presence of coronary angioplasty implant and graft: Secondary | ICD-10-CM

## 2018-07-21 DIAGNOSIS — M810 Age-related osteoporosis without current pathological fracture: Secondary | ICD-10-CM | POA: Diagnosis present

## 2018-07-21 DIAGNOSIS — I10 Essential (primary) hypertension: Secondary | ICD-10-CM | POA: Diagnosis not present

## 2018-07-21 DIAGNOSIS — Z79890 Hormone replacement therapy (postmenopausal): Secondary | ICD-10-CM

## 2018-07-21 DIAGNOSIS — I951 Orthostatic hypotension: Secondary | ICD-10-CM | POA: Diagnosis present

## 2018-07-21 DIAGNOSIS — Z7902 Long term (current) use of antithrombotics/antiplatelets: Secondary | ICD-10-CM

## 2018-07-21 DIAGNOSIS — E785 Hyperlipidemia, unspecified: Secondary | ICD-10-CM | POA: Diagnosis present

## 2018-07-21 DIAGNOSIS — I472 Ventricular tachycardia: Secondary | ICD-10-CM | POA: Diagnosis present

## 2018-07-21 DIAGNOSIS — I4510 Unspecified right bundle-branch block: Secondary | ICD-10-CM | POA: Diagnosis present

## 2018-07-21 DIAGNOSIS — J449 Chronic obstructive pulmonary disease, unspecified: Secondary | ICD-10-CM | POA: Diagnosis present

## 2018-07-21 DIAGNOSIS — Z7289 Other problems related to lifestyle: Secondary | ICD-10-CM

## 2018-07-21 DIAGNOSIS — Z8249 Family history of ischemic heart disease and other diseases of the circulatory system: Secondary | ICD-10-CM

## 2018-07-21 DIAGNOSIS — M1990 Unspecified osteoarthritis, unspecified site: Secondary | ICD-10-CM | POA: Diagnosis present

## 2018-07-21 DIAGNOSIS — R55 Syncope and collapse: Secondary | ICD-10-CM | POA: Diagnosis not present

## 2018-07-21 DIAGNOSIS — M542 Cervicalgia: Secondary | ICD-10-CM | POA: Diagnosis not present

## 2018-07-21 DIAGNOSIS — E039 Hypothyroidism, unspecified: Secondary | ICD-10-CM | POA: Diagnosis present

## 2018-07-21 DIAGNOSIS — M109 Gout, unspecified: Secondary | ICD-10-CM | POA: Diagnosis present

## 2018-07-21 DIAGNOSIS — R296 Repeated falls: Secondary | ICD-10-CM | POA: Diagnosis present

## 2018-07-21 DIAGNOSIS — I2510 Atherosclerotic heart disease of native coronary artery without angina pectoris: Secondary | ICD-10-CM | POA: Diagnosis present

## 2018-07-21 DIAGNOSIS — R51 Headache: Secondary | ICD-10-CM | POA: Diagnosis not present

## 2018-07-21 DIAGNOSIS — Z79899 Other long term (current) drug therapy: Secondary | ICD-10-CM

## 2018-07-21 DIAGNOSIS — Z8679 Personal history of other diseases of the circulatory system: Secondary | ICD-10-CM

## 2018-07-21 DIAGNOSIS — K625 Hemorrhage of anus and rectum: Secondary | ICD-10-CM

## 2018-07-21 DIAGNOSIS — S3991XA Unspecified injury of abdomen, initial encounter: Secondary | ICD-10-CM | POA: Diagnosis not present

## 2018-07-21 DIAGNOSIS — Y92002 Bathroom of unspecified non-institutional (private) residence single-family (private) house as the place of occurrence of the external cause: Secondary | ICD-10-CM | POA: Diagnosis not present

## 2018-07-21 DIAGNOSIS — I130 Hypertensive heart and chronic kidney disease with heart failure and stage 1 through stage 4 chronic kidney disease, or unspecified chronic kidney disease: Secondary | ICD-10-CM | POA: Diagnosis present

## 2018-07-21 DIAGNOSIS — S299XXA Unspecified injury of thorax, initial encounter: Secondary | ICD-10-CM | POA: Diagnosis not present

## 2018-07-21 DIAGNOSIS — I959 Hypotension, unspecified: Secondary | ICD-10-CM | POA: Diagnosis not present

## 2018-07-21 DIAGNOSIS — Z95810 Presence of automatic (implantable) cardiac defibrillator: Secondary | ICD-10-CM

## 2018-07-21 DIAGNOSIS — I255 Ischemic cardiomyopathy: Secondary | ICD-10-CM | POA: Diagnosis present

## 2018-07-21 DIAGNOSIS — Z86010 Personal history of colonic polyps: Secondary | ICD-10-CM

## 2018-07-21 DIAGNOSIS — S0181XA Laceration without foreign body of other part of head, initial encounter: Secondary | ICD-10-CM

## 2018-07-21 DIAGNOSIS — S0101XA Laceration without foreign body of scalp, initial encounter: Secondary | ICD-10-CM

## 2018-07-21 DIAGNOSIS — S199XXA Unspecified injury of neck, initial encounter: Secondary | ICD-10-CM | POA: Diagnosis not present

## 2018-07-21 DIAGNOSIS — Z7983 Long term (current) use of bisphosphonates: Secondary | ICD-10-CM

## 2018-07-21 DIAGNOSIS — D539 Nutritional anemia, unspecified: Secondary | ICD-10-CM | POA: Diagnosis present

## 2018-07-21 DIAGNOSIS — N179 Acute kidney failure, unspecified: Secondary | ICD-10-CM | POA: Diagnosis present

## 2018-07-21 DIAGNOSIS — Z888 Allergy status to other drugs, medicaments and biological substances status: Secondary | ICD-10-CM

## 2018-07-21 DIAGNOSIS — K921 Melena: Secondary | ICD-10-CM | POA: Diagnosis present

## 2018-07-21 DIAGNOSIS — S61512A Laceration without foreign body of left wrist, initial encounter: Secondary | ICD-10-CM | POA: Diagnosis not present

## 2018-07-21 DIAGNOSIS — K922 Gastrointestinal hemorrhage, unspecified: Secondary | ICD-10-CM | POA: Diagnosis not present

## 2018-07-21 DIAGNOSIS — N183 Chronic kidney disease, stage 3 unspecified: Secondary | ICD-10-CM

## 2018-07-21 DIAGNOSIS — Z85828 Personal history of other malignant neoplasm of skin: Secondary | ICD-10-CM

## 2018-07-21 DIAGNOSIS — S0990XA Unspecified injury of head, initial encounter: Secondary | ICD-10-CM

## 2018-07-21 DIAGNOSIS — Z87891 Personal history of nicotine dependence: Secondary | ICD-10-CM

## 2018-07-21 DIAGNOSIS — R531 Weakness: Secondary | ICD-10-CM | POA: Diagnosis not present

## 2018-07-21 DIAGNOSIS — Z789 Other specified health status: Secondary | ICD-10-CM | POA: Diagnosis present

## 2018-07-21 LAB — COMPREHENSIVE METABOLIC PANEL
ALBUMIN: 3 g/dL — AB (ref 3.5–5.0)
ALK PHOS: 68 U/L (ref 38–126)
ALT: 38 U/L (ref 0–44)
AST: 39 U/L (ref 15–41)
Anion gap: 9 (ref 5–15)
BILIRUBIN TOTAL: 0.7 mg/dL (ref 0.3–1.2)
BUN: 28 mg/dL — AB (ref 8–23)
CALCIUM: 8.3 mg/dL — AB (ref 8.9–10.3)
CO2: 25 mmol/L (ref 22–32)
CREATININE: 1.36 mg/dL — AB (ref 0.61–1.24)
Chloride: 104 mmol/L (ref 98–111)
GFR calc Af Amer: 53 mL/min — ABNORMAL LOW (ref >60)
GFR, EST NON AFRICAN AMERICAN: 46 mL/min — AB (ref >60)
Glucose, Bld: 138 mg/dL — ABNORMAL HIGH (ref 70–99)
POTASSIUM: 4.4 mmol/L (ref 3.5–5.1)
Sodium: 138 mmol/L (ref 135–145)
TOTAL PROTEIN: 6 g/dL — AB (ref 6.5–8.1)

## 2018-07-21 LAB — CBC
HCT: 34 % — ABNORMAL LOW (ref 39.0–52.0)
HCT: 31.5 % — ABNORMAL LOW (ref 39.0–52.0)
HEMATOCRIT: 31.5 % — AB (ref 39.0–52.0)
HEMOGLOBIN: 11.2 g/dL — AB (ref 13.0–17.0)
HEMOGLOBIN: 10.6 g/dL — AB (ref 13.0–17.0)
Hemoglobin: 11.2 g/dL — ABNORMAL LOW (ref 13.0–17.0)
MCH: 32.2 pg (ref 26.0–34.0)
MCH: 32.7 pg (ref 26.0–34.0)
MCH: 32 pg (ref 26.0–34.0)
MCHC: 32.9 g/dL (ref 30.0–36.0)
MCHC: 35.6 g/dL (ref 30.0–36.0)
MCHC: 33.7 g/dL (ref 30.0–36.0)
MCV: 97.7 fL (ref 78.0–100.0)
MCV: 91.8 fL (ref 78.0–100.0)
MCV: 95.2 fL (ref 78.0–100.0)
PLATELETS: 126 10*3/uL — AB (ref 150–400)
PLATELETS: 128 10*3/uL — AB (ref 150–400)
Platelets: 118 10*3/uL — ABNORMAL LOW (ref 150–400)
RBC: 3.48 MIL/uL — ABNORMAL LOW (ref 4.22–5.81)
RBC: 3.43 MIL/uL — AB (ref 4.22–5.81)
RBC: 3.31 MIL/uL — ABNORMAL LOW (ref 4.22–5.81)
RDW: 15.7 % — AB (ref 11.5–15.5)
RDW: 15.6 % — AB (ref 11.5–15.5)
RDW: 15.9 % — ABNORMAL HIGH (ref 11.5–15.5)
WBC: 16.4 10*3/uL — AB (ref 4.0–10.5)
WBC: 18.2 10*3/uL — AB (ref 4.0–10.5)
WBC: 16.2 10*3/uL — AB (ref 4.0–10.5)

## 2018-07-21 LAB — PREPARE RBC (CROSSMATCH)

## 2018-07-21 LAB — I-STAT CHEM 8, ED
BUN: 30 mg/dL — ABNORMAL HIGH (ref 8–23)
CREATININE: 1.4 mg/dL — AB (ref 0.61–1.24)
Calcium, Ion: 1.12 mmol/L — ABNORMAL LOW (ref 1.15–1.40)
Chloride: 102 mmol/L (ref 98–111)
GLUCOSE: 132 mg/dL — AB (ref 70–99)
HEMATOCRIT: 35 % — AB (ref 39.0–52.0)
HEMOGLOBIN: 11.9 g/dL — AB (ref 13.0–17.0)
POTASSIUM: 4.4 mmol/L (ref 3.5–5.1)
Sodium: 140 mmol/L (ref 135–145)
TCO2: 26 mmol/L (ref 22–32)

## 2018-07-21 LAB — I-STAT TROPONIN, ED: TROPONIN I, POC: 0.02 ng/mL (ref 0.00–0.08)

## 2018-07-21 LAB — CBC WITH DIFFERENTIAL/PLATELET
Abs Immature Granulocytes: 0.3 10*3/uL — ABNORMAL HIGH (ref 0.0–0.1)
BASOS ABS: 0.1 10*3/uL (ref 0.0–0.1)
Basophils Relative: 0 %
EOS ABS: 0.7 10*3/uL (ref 0.0–0.7)
EOS PCT: 4 %
HCT: 35.9 % — ABNORMAL LOW (ref 39.0–52.0)
HEMOGLOBIN: 11.6 g/dL — AB (ref 13.0–17.0)
Immature Granulocytes: 2 %
LYMPHS PCT: 10 %
Lymphs Abs: 1.7 10*3/uL (ref 0.7–4.0)
MCH: 32.8 pg (ref 26.0–34.0)
MCHC: 32.3 g/dL (ref 30.0–36.0)
MCV: 101.4 fL — ABNORMAL HIGH (ref 78.0–100.0)
Monocytes Absolute: 1.1 10*3/uL — ABNORMAL HIGH (ref 0.1–1.0)
Monocytes Relative: 7 %
Neutro Abs: 12.8 10*3/uL — ABNORMAL HIGH (ref 1.7–7.7)
Neutrophils Relative %: 77 %
PLATELETS: 172 10*3/uL (ref 150–400)
RBC: 3.54 MIL/uL — AB (ref 4.22–5.81)
RDW: 13.8 % (ref 11.5–15.5)
WBC: 16.7 10*3/uL — AB (ref 4.0–10.5)

## 2018-07-21 LAB — PROTIME-INR
INR: 1.19
PROTHROMBIN TIME: 15 s (ref 11.4–15.2)

## 2018-07-21 LAB — APTT: APTT: 26 s (ref 24–36)

## 2018-07-21 MED ORDER — TECHNETIUM TC 99M-LABELED RED BLOOD CELLS IV KIT
27.3000 | PACK | Freq: Once | INTRAVENOUS | Status: AC | PRN
  Administered 2018-07-21: 27.3 via INTRAVENOUS

## 2018-07-21 MED ORDER — ADULT MULTIVITAMIN W/MINERALS CH
1.0000 | ORAL_TABLET | Freq: Every day | ORAL | Status: DC
  Administered 2018-07-21 – 2018-07-24 (×4): 1 via ORAL
  Filled 2018-07-21 (×4): qty 1

## 2018-07-21 MED ORDER — LACTATED RINGERS IV SOLN
INTRAVENOUS | Status: AC
  Administered 2018-07-21: 11:00:00 via INTRAVENOUS

## 2018-07-21 MED ORDER — SODIUM CHLORIDE 0.9% IV SOLUTION: Freq: Once | INTRAVENOUS | Status: DC

## 2018-07-21 MED ORDER — LACTATED RINGERS IV BOLUS
250.0000 mL | Freq: Once | INTRAVENOUS | Status: AC
  Administered 2018-07-21: 250 mL via INTRAVENOUS

## 2018-07-21 MED ORDER — AMIODARONE HCL 200 MG PO TABS
200.0000 mg | ORAL_TABLET | Freq: Every day | ORAL | Status: DC
  Administered 2018-07-21 – 2018-07-24 (×4): 200 mg via ORAL
  Filled 2018-07-21 (×4): qty 1

## 2018-07-21 MED ORDER — SODIUM CHLORIDE 0.9% IV SOLUTION
Freq: Once | INTRAVENOUS | Status: AC
  Administered 2018-07-21: 06:00:00 via INTRAVENOUS

## 2018-07-21 MED ORDER — ACETAMINOPHEN 650 MG RE SUPP: 650.0000 mg | Freq: Four times a day (QID) | RECTAL | Status: DC | PRN

## 2018-07-21 MED ORDER — ONDANSETRON HCL 4 MG PO TABS: 4.0000 mg | ORAL_TABLET | Freq: Four times a day (QID) | ORAL | Status: DC | PRN

## 2018-07-21 MED ORDER — ALBUTEROL SULFATE (2.5 MG/3ML) 0.083% IN NEBU: 3.0000 mL | INHALATION_SOLUTION | Freq: Four times a day (QID) | RESPIRATORY_TRACT | Status: DC | PRN

## 2018-07-21 MED ORDER — OXYBUTYNIN CHLORIDE 5 MG PO TABS
5.0000 mg | ORAL_TABLET | Freq: Two times a day (BID) | ORAL | Status: DC
  Administered 2018-07-21 – 2018-07-24 (×6): 5 mg via ORAL
  Filled 2018-07-21 (×6): qty 1

## 2018-07-21 MED ORDER — TETANUS-DIPHTH-ACELL PERTUSSIS 5-2.5-18.5 LF-MCG/0.5 IM SUSP: 0.5000 mL | Freq: Once | INTRAMUSCULAR | Status: DC

## 2018-07-21 MED ORDER — GUAIFENESIN ER 600 MG PO TB12: 600.0000 mg | ORAL_TABLET | Freq: Two times a day (BID) | ORAL | Status: DC

## 2018-07-21 MED ORDER — LIDOCAINE-EPINEPHRINE (PF) 2 %-1:200000 IJ SOLN
10.0000 mL | Freq: Once | INTRAMUSCULAR | Status: AC
  Administered 2018-07-21: 10 mL via INTRADERMAL
  Filled 2018-07-21: qty 20

## 2018-07-21 MED ORDER — MOMETASONE FURO-FORMOTEROL FUM 200-5 MCG/ACT IN AERO
2.0000 | INHALATION_SPRAY | Freq: Two times a day (BID) | RESPIRATORY_TRACT | Status: DC
  Administered 2018-07-22 – 2018-07-24 (×5): 2 via RESPIRATORY_TRACT
  Filled 2018-07-21: qty 8.8

## 2018-07-21 MED ORDER — PRAVASTATIN SODIUM 40 MG PO TABS
40.0000 mg | ORAL_TABLET | Freq: Every day | ORAL | Status: DC
  Administered 2018-07-22 – 2018-07-24 (×3): 40 mg via ORAL
  Filled 2018-07-21 (×3): qty 1

## 2018-07-21 MED ORDER — IOPAMIDOL (ISOVUE-370) INJECTION 76%
INTRAVENOUS | Status: AC
  Administered 2018-07-21: 80 mL
  Filled 2018-07-21: qty 100

## 2018-07-21 MED ORDER — GUAIFENESIN ER 600 MG PO TB12
1200.0000 mg | ORAL_TABLET | Freq: Every morning | ORAL | Status: DC
  Administered 2018-07-22 – 2018-07-24 (×3): 1200 mg via ORAL
  Filled 2018-07-21 (×4): qty 2

## 2018-07-21 MED ORDER — SERTRALINE HCL 50 MG PO TABS
50.0000 mg | ORAL_TABLET | Freq: Every day | ORAL | Status: DC
  Administered 2018-07-21 – 2018-07-23 (×3): 50 mg via ORAL
  Filled 2018-07-21 (×3): qty 1

## 2018-07-21 MED ORDER — TRAMADOL HCL 50 MG PO TABS: 50.0000 mg | ORAL_TABLET | Freq: Four times a day (QID) | ORAL | Status: DC | PRN

## 2018-07-21 MED ORDER — VITAMIN B-1 100 MG PO TABS
100.0000 mg | ORAL_TABLET | Freq: Every day | ORAL | Status: DC
  Administered 2018-07-21 – 2018-07-24 (×4): 100 mg via ORAL
  Filled 2018-07-21 (×4): qty 1

## 2018-07-21 MED ORDER — IOPAMIDOL (ISOVUE-370) INJECTION 76%
INTRAVENOUS | Status: AC
  Filled 2018-07-21: qty 100

## 2018-07-21 MED ORDER — ACETAMINOPHEN 325 MG PO TABS: 650.0000 mg | ORAL_TABLET | Freq: Four times a day (QID) | ORAL | Status: DC | PRN

## 2018-07-21 MED ORDER — CLOPIDOGREL BISULFATE 75 MG PO TABS: 75.0000 mg | ORAL_TABLET | Freq: Every day | ORAL | Status: DC

## 2018-07-21 MED ORDER — MIDODRINE HCL 2.5 MG PO TABS
2.5000 mg | ORAL_TABLET | Freq: Three times a day (TID) | ORAL | Status: DC
  Administered 2018-07-22 – 2018-07-23 (×4): 2.5 mg via ORAL
  Filled 2018-07-21 (×10): qty 1

## 2018-07-21 MED ORDER — GUAIFENESIN ER 600 MG PO TB12
600.0000 mg | ORAL_TABLET | Freq: Every day | ORAL | Status: DC
  Administered 2018-07-21 – 2018-07-23 (×3): 600 mg via ORAL
  Filled 2018-07-21 (×3): qty 1

## 2018-07-21 MED ORDER — MAGNESIUM OXIDE 400 (241.3 MG) MG PO TABS
200.0000 mg | ORAL_TABLET | Freq: Every day | ORAL | Status: DC
  Administered 2018-07-21 – 2018-07-24 (×4): 200 mg via ORAL
  Filled 2018-07-21 (×4): qty 1

## 2018-07-21 MED ORDER — ONDANSETRON HCL 4 MG/2ML IJ SOLN: 4.0000 mg | Freq: Four times a day (QID) | INTRAMUSCULAR | Status: DC | PRN

## 2018-07-21 MED ORDER — LACTATED RINGERS IV SOLN
INTRAVENOUS | Status: DC
  Administered 2018-07-21: 75 mL/h via INTRAVENOUS

## 2018-07-21 MED ORDER — LEVOTHYROXINE SODIUM 50 MCG PO TABS
50.0000 ug | ORAL_TABLET | Freq: Every day | ORAL | Status: DC
  Administered 2018-07-22 – 2018-07-24 (×3): 50 ug via ORAL
  Filled 2018-07-21 (×3): qty 1

## 2018-07-21 MED ORDER — SODIUM CHLORIDE 0.9 % IV BOLUS (SEPSIS)
500.0000 mL | Freq: Once | INTRAVENOUS | Status: AC
  Administered 2018-07-21: 500 mL via INTRAVENOUS

## 2018-07-21 MED ORDER — SODIUM CHLORIDE 0.9 % IV BOLUS (SEPSIS): 1000.0000 mL | Freq: Once | INTRAVENOUS | Status: DC

## 2018-07-21 MED ORDER — ALLOPURINOL 100 MG PO TABS
100.0000 mg | ORAL_TABLET | Freq: Every day | ORAL | Status: DC
  Administered 2018-07-22 – 2018-07-24 (×3): 100 mg via ORAL
  Filled 2018-07-21 (×3): qty 1

## 2018-07-21 NOTE — H&P (Signed)
History and Physical    [name] JSH:[id number] DOB: April 23, 1931 DOA: 07/21/2018  PCP: Noralee Space, MD Consultants:  [facility] Iles - GI; [place] - cardiology; [place] Crane - dermatology Patient coming from:  Home - lives alone; [place]: Daughter, [phone]  Chief Complaint: Rectal bleeding, syncope  HPI: [name] is a 84 y.o. male with medical history significant of tobacco dependence; AICD placement for NICM; PVD; HTN; HLD; COPD; chronic combined CHF; CAD s/p stenting; AAA s/p repair (2002); and carotid disease presenting with rectal bleeding with syncope. He thought he had diarrhea, instead he was bleeding all over the place.  He passed out and hit his head on the toilet.  He was normal when he went to bed.  He woke up about 1-2AM.  He thinks he had 2 episodes of bloody BMs.  He did not have any pain.  He got lightheaded and passed out.  No prior h/o similar.  His last c-scope was >10 years ago.  He does take blood thinners, uncertain which.  He currently feels like "a horse that's been ridden too far and put up wet."  He does have neck discomfort but does not cause pain.  He recently finished steroids about 1 week ago because Dr. Lenna Gilford "heard congestion in my lungs and I had had pneumonia earlier this year."  He thinks he also took antibiotics, "a horse pill" - apparently Augmentin and/or Azithromycin as well as a prednisone dose pack.   ED Course:  GI bleeding.  Felt great last night, bloody BM overnight.  Syncope standing, hit head on sink.  Pacemaker interrogation ok.  Hypotensive with EMS and in ER.  IVF, 1 unit PRBC.  BP improved, Hgb ok.  He is taking Plavix.  CTA C/A/P unremarkable.  Forehead and scalp lac repairs.  Review of Systems: As per HPI; otherwise review of systems reviewed and negative.   Ambulatory Status:  Ambulates with a walker  Past Medical History:  Diagnosis Date  . AAA (abdominal aortic aneurysm) ([place])    a. 2002 s/p repair.  . Allergic rhinitis   . Back pain   .  CAD (coronary artery disease)    a. 02/2002 H/o MI with stenting x 2; b. 04/2013 MV: EF 25%, large posterior lateral infarct w/o ischemia; c. 03/2016 VT Arrest/Cath: LM 60ost, LAD 40p/m ISR, LCX 100p/m, RCA nl-->Med Rx.  . Carotid arterial disease ([place])    a. 12/2010 s/p R CEA;  b. 05/2015 Carotid U/S: bilat <40% ICA stenosis.  . Chronic combined systolic and diastolic CHF (congestive heart failure) ([place])    a. 03/2016 Echo: Ef 35-40%, grade 1 DD.  Marland Kitchen Compression fracture   . COPD (chronic obstructive pulmonary disease) ([place])   . Dermatitis   . Diverticulosis of colon   . DJD (degenerative joint disease)    and Gout  . Hemorrhoids   . History of colonic polyps   . History of gout   . History of pneumonia   . Hypercholesterolemia   . Hypertension   . Hypertensive heart disease   . Ischemic cardiomyopathy    a. EF prev <35%-->improved to normal by Echo 8/14:  Mild LVH, focal basal hypertrophy, EF 60-65%, normal wall motion, mild BAE, PASP 36; c. 03/2016 Echo: EF 35-40%, Gr1 DD, triv AI, mild MR, mod dil LA, mild-mod TR, PASP 25mmHg.  . Osteoporosis   . Peripheral vascular disease ([place])   . Presence of cardiac defibrillator    a. 03/2008 s/p MDT [id number] Maximo II DR,  DC AICD; b. 03/2013: ICD shock for T wave oversensing;  c. 10/2015: collective decision not to replace ICD given improvement in LV fxn; d. VT Arrest-->Gen change to MDT ser # [id number] H.  . Skin cancer    shoulders and forehead  . Syncope   . T wave over sensing resulting in inappropriate shocks    a. 03/2013.  . Tobacco abuse   . Ventricular tachycardia ([place]) 04/01/2016    Past Surgical History:  Procedure Laterality Date  . ABDOMINAL AORTIC ANEURYSM REPAIR  2002   by Dr. Kellie Simmering  . aicd placed  03/2008   Dr. Caryl Comes  . cad stent  02/2002   Dr. Percival Spanish  . CARDIAC CATHETERIZATION     X 2 stents  . CARDIAC CATHETERIZATION N/A 04/03/2016   Procedure: Left Heart Cath and Coronary Angiography;  Surgeon: Belva Crome, MD;  Location:  [facility];  Service: Cardiovascular;  Laterality: N/A;  . CARDIAC DEFIBRILLATOR PLACEMENT  2009  . CARDIAC DEFIBRILLATOR PLACEMENT    . CAROTID ENDARTERECTOMY Right January 02, 2011  . EAR CYST EXCISION Left 12/13/2015   Procedure: Excision left ear lesion ;  Surgeon: Melissa Montane, MD;  Location: [facility];  Service: ENT;  Laterality: Left;  . EP IMPLANTABLE DEVICE N/A 04/06/2016   Procedure:  ICD Generator Changeout;  Surgeon: Deboraha Sprang, MD;  Location: [facility];  Service: Cardiovascular;  Laterality: N/A;  . EXCISION OF LESION LEFT EAR Left 12/13/2015  . I&D EXTREMITY Left 04/23/2016   Procedure: IRRIGATION AND DEBRIDEMENT HAND;  Surgeon: Leandrew Koyanagi, MD;  Location: [facility];  Service: Orthopedics;  Laterality: Left;  . IR IMAGE GUIDED DRAINAGE BY PERCUTANEOUS CATHETER  10/08/2017  . OPEN REDUCTION INTERNAL FIXATION (ORIF) METACARPAL Left 04/04/2016   Procedure: OPEN REDUCTION INTERNAL FIXATION (ORIF) LEFT 2ND, 3RD, 4TH METACARPAL FRACTURE;  Surgeon: Leandrew Koyanagi, MD;  Location: [facility];  Service: Orthopedics;  Laterality: Left;  OPEN REDUCTION INTERNAL FIXATION (ORIF) LEFT 2ND, 3RD, 4TH METACARPAL FRACTURE  . OPEN REDUCTION INTERNAL FIXATION (ORIF) METACARPAL Left 04/23/2016   Procedure: REVISION OPEN REDUCTION INTERNAL FIXATION (ORIF) 2ND METACARPAL;  Surgeon: Leandrew Koyanagi, MD;  Location: [facility];  Service: Orthopedics;  Laterality: Left;  . right corotid enderectomy  12/2010   Dr. Scot Dock  . SKIN SPLIT GRAFT Left 12/13/2015   Procedure: with possible skin graft;  Surgeon: Melissa Montane, MD;  Location: [facility];  Service: ENT;  Laterality: Left;  . TONSILLECTOMY      Social History   Socioeconomic History  . Marital status: Married    Spouse name: Not on file  . Number of children: 3  . Years of education: Not on file  . Highest education level: Not on file  Occupational History  . Occupation: laywer    Comment: retired  Scientific laboratory technician  . Financial resource strain: Not on file  . Food  insecurity:    Worry: Not on file    Inability: Not on file  . Transportation needs:    Medical: Not on file    Non-medical: Not on file  Tobacco Use  . Smoking status: Former Smoker    Packs/day: 1.50    Years: 65.00    Pack years: 97.50    Types: Cigarettes, Cigars, Pipe    Last attempt to quit: 03/13/2008    Years since quitting: 10.3  . Smokeless tobacco: Never Used  . Tobacco comment: quit about 10 years ago  Substance and Sexual Activity  .  Alcohol use: Yes    Alcohol/week: 2.0 standard drinks    Types: 1 Cans of beer, 1 Glasses of wine per week    Comment: h/o heavy use, but "quit" drinking and now only drinks 1-3 daily  . Drug use: No  . Sexual activity: Not on file  Lifestyle  . Physical activity:    Days per week: Not on file    Minutes per session: Not on file  . Stress: Not on file  Relationships  . Social connections:    Talks on phone: Not on file    Gets together: Not on file    Attends religious service: Not on file    Active member of club or organization: Not on file    Attends meetings of clubs or organizations: Not on file    Relationship status: Not on file  . Intimate partner violence:    Fear of current or ex partner: Not on file    Emotionally abused: Not on file    Physically abused: Not on file    Forced sexual activity: Not on file  Other Topics Concern  . Not on file  Social History Narrative   ** Merged History Encounter **       Lives locally.  Had been living with wife who had become quite ill recently and died on the evening of 2016-04-07.    Allergies  Allergen Reactions  . Lisinopril     BP dropped too low per daughter    Family History  Problem Relation Age of Onset  . Hypertension Father   . Heart disease Father        Heart Disease before age 55  . Hypertension Mother   . Heart disease Mother        Heart Disease before age 17  . Cancer Mother   . Scoliosis Sister   . Thyroid disease Daughter   . Anxiety disorder Son      Prior to Admission medications   Medication Sig Start Date End Date Taking? Authorizing Provider  albuterol (PROVENTIL HFA;VENTOLIN HFA) 108 (90 Base) MCG/ACT inhaler Inhale 2 puffs into the lungs every 6 (six) hours as needed for wheezing or shortness of breath. 01/14/17   Debbe Odea, MD  alendronate (FOSAMAX) 70 MG tablet Take 1 tablet (70 mg total) by mouth once a week. Take with a full glass of water on an empty stomach. 03/13/18   Bo Merino, MD  allopurinol (ZYLOPRIM) 100 MG tablet TAKE 1 TABLET ONCE DAILY. 06/09/18   Noralee Space, MD  amiodarone (PACERONE) 200 MG tablet TAKE 1 TABLET ONCE DAILY. 06/16/18   Noralee Space, MD  amoxicillin-clavulanate (AUGMENTIN) 875-125 MG tablet Take 1 tablet by mouth 2 (two) times daily. 07/16/18   Noralee Space, MD  azithromycin (ZITHROMAX) 250 MG tablet Take as directed 07/15/18   Noralee Space, MD  carvedilol (COREG) 3.125 MG tablet TAKE 1 TABLET BY MOUTH TWICE DAILY WITH A MEAL. 06/03/18   Minus Breeding, MD  cholecalciferol (VITAMIN D) 1000 units tablet Take 1,000 Units by mouth daily.    [provider]  clopidogrel (PLAVIX) 75 MG tablet TAKE 1 TABLET ONCE DAILY. 06/30/18   Noralee Space, MD  fluticasone (CUTIVATE) 0.05 % cream  02/26/18   [provider]  fluticasone-salmeterol (ADVAIR HFA) 115-21 MCG/ACT inhaler Inhale 2 puffs into the lungs 2 (two) times daily. 01/14/18   Noralee Space, MD  furosemide (LASIX) 20 MG tablet Take 1 tablet Monday, Wednesday  and Friday 11/13/17   Erlene Quan, PA-C  guaiFENesin (MUCINEX) 600 MG 12 hr tablet Take 600-1,200 mg by mouth 2 (two) times daily. Takes two in the morning and one in the evening.    [provider]  levothyroxine (SYNTHROID, LEVOTHROID) 50 MCG tablet Take 50 mcg by mouth daily before breakfast.    [provider]  magnesium oxide (MAG-OX) 400 (241.3 Mg) MG tablet Take 0.5 tablets (200 mg total) by mouth daily. 04/27/16   Love, Ivan Anchors, PA-C    methylPREDNISolone (MEDROL DOSEPAK) 4 MG TBPK tablet 6 day pak-take as directed 07/15/18   Noralee Space, MD  midodrine (PROAMATINE) 2.5 MG tablet TAKE 1 TABLET 3 TIMES DAILY WITH MEALS. 06/02/18   Minus Breeding, MD  Multiple Vitamin (MULTIVITAMIN) capsule Take 1 capsule by mouth daily.    [provider]  oxybutynin (DITROPAN) 5 MG tablet TAKE 1 TABLET BY MOUTH TWICE DAILY. 04/28/18   Noralee Space, MD  polyethylene glycol powder (QC NATURA-LAX) powder 1 capful in 8oz water twice daily Patient taking differently: 1 capful in [place] water as needed 03/20/18   Noralee Space, MD  potassium chloride (K-DUR) 10 MEQ tablet Take 1 tablet Monday, Wednesday and Friday 11/13/17   Erlene Quan, PA-C  pravastatin (PRAVACHOL) 40 MG tablet TAKE 1 TABLET ONCE DAILY. 04/21/18   Noralee Space, MD  sertraline (ZOLOFT) 50 MG tablet TAKE ONE TABLET AT BEDTIME. 06/30/18   Noralee Space, MD  thiamine 100 MG tablet Take 1 tablet (100 mg total) by mouth daily. 04/26/16   Love, Ivan Anchors, PA-C  traMADol (ULTRAM) 50 MG tablet Take 1 tablet (50 mg total) by mouth every 6 (six) hours as needed for severe pain. 10/14/17   Robbie Lis, MD    Physical Exam: Vitals:   07/21/18 0830 07/21/18 0845 07/21/18 0900 07/21/18 0915  BP: 119/63 126/63 126/64 (!) 107/59  Pulse: (!) 59 (!) 59 (!) 59 (!) 58  Resp: 14 16 12 11   Temp:      TempSrc:      SpO2: 100% 100% 100% 100%  Weight:      Height:         General:  Appears calm and comfortable and is NAD; however, he has a large amount of dried blood covering his head and upper body Eyes:  PERRL, EOMI, normal lids, iris ENT:  grossly normal hearing, lips & tongue, mmm; appropriate dentition Neck:  no LAD, masses or thyromegaly; no carotid bruits Cardiovascular:  RRR, no m/r/g. No LE edema.  Respiratory:   CTA bilaterally with no wheezes/rales/rhonchi.  Normal respiratory effort. Abdomen:  soft, NT, ND, NABS Skin: laceration to left forehead s/p repair; laceration to  occiput s/p repair Musculoskeletal:  grossly normal tone BUE/BLE, good ROM, no bony abnormality Lower extremity:  No LE edema.  Limited foot exam with no ulcerations.  2+ distal pulses. Psychiatric:  grossly normal mood and affect, speech fluent and appropriate, AOx3 Neurologic:  CN 2-12 grossly intact, moves all extremities in coordinated fashion, sensation intact    Radiological Exams on Admission: Ct Head Wo Contrast  Result Date: 07/21/2018 CLINICAL DATA:  Pain following fall EXAM: CT HEAD WITHOUT CONTRAST CT CERVICAL SPINE WITHOUT CONTRAST TECHNIQUE: Multidetector CT imaging of the head and cervical spine was performed following the standard protocol without intravenous contrast. Multiplanar CT image reconstructions of the cervical spine were also generated. CT HEAD FINDINGS Brain: There is moderate diffuse atrophy. There is no intracranial mass,  hemorrhage, extra-axial fluid collection, or midline shift. There is decreased attenuation throughout much of the centra semiovale bilaterally, felt to represent supratentorial small vessel disease. There is evidence of a prior small infarct in the head of the caudate nucleus on the right. No acute appearing infarct is evident. Vascular: No hyperdense vessels are evident. There is calcification in each distal vertebral artery as well as in the basilar artery and both carotid siphon regions. Skull: There are skin staples overlying the superior most aspect of the scalp posteriorly. There is mild hematoma in this area. The bony calvarium appears intact. Minimal soft tissue air is noted in the scalp over the left frontal bone. Sinuses/Orbits: There is opacification of the right maxillary antrum as well as multiple right-sided ethmoid air cells. There is also opacification in the right frontal sinus region. There is evidence of previous antrostomies bilaterally. Orbits appear symmetric bilaterally. Other: Mastoid air cells are clear. CT CERVICAL SPINE FINDINGS  Alignment: There is no appreciable spondylolisthesis. Skull base and vertebrae: The skull base and craniocervical junction regions appear normal. Bones are osteoporotic. No fracture evident. There are no blastic or lytic bone lesions. Soft tissues and spinal canal: Prevertebral soft tissues and predental space regions are within normal limits. No paraspinous lesions. No cord canal hematoma. Disc levels: There is severe disc space narrowing at C5-6, C6-7, and C7-T1. There is multilevel facet osteoarthritic change. There is exit foraminal narrowing due to bony hypertrophy at C2-3 on the left, at C3-4 on the left, at C4-5 on the left, to a lesser extent at C5-6 on the left, and C6-7 on the left. There is marked impression on the exiting nerve root at C3-4 and C4-5 on the left due to bony hypertrophy. No disc extrusion or stenosis. Upper chest: Visualized upper lung zones are clear. Other: There is calcification in each subclavian and carotid artery. IMPRESSION: CT head: Atrophy with supratentorial small vessel disease. Prior small infarct in the head of the caudate nucleus on the right. No mass or hemorrhage. No acute infarct. There are foci of arterial vascular calcification. There are skin staples over the skull vertex. No fracture evident. Extensive paranasal sinus disease at multiple sites on the right side. CT cervical spine: No fracture or appreciable spondylolisthesis. Extensive multifocal arthropathy. Exit foraminal narrowing noted at multiple levels on the left, most severe at C3-4 and C4-5. There is calcification in both subclavian and carotid arteries. Electronically Signed   By: Lowella Grip III M.D.   On: 07/21/2018 07:26   Ct Cervical Spine Wo Contrast  Result Date: 07/21/2018 CLINICAL DATA:  Pain following fall EXAM: CT HEAD WITHOUT CONTRAST CT CERVICAL SPINE WITHOUT CONTRAST TECHNIQUE: Multidetector CT imaging of the head and cervical spine was performed following the standard protocol without  intravenous contrast. Multiplanar CT image reconstructions of the cervical spine were also generated. CT HEAD FINDINGS Brain: There is moderate diffuse atrophy. There is no intracranial mass, hemorrhage, extra-axial fluid collection, or midline shift. There is decreased attenuation throughout much of the centra semiovale bilaterally, felt to represent supratentorial small vessel disease. There is evidence of a prior small infarct in the head of the caudate nucleus on the right. No acute appearing infarct is evident. Vascular: No hyperdense vessels are evident. There is calcification in each distal vertebral artery as well as in the basilar artery and both carotid siphon regions. Skull: There are skin staples overlying the superior most aspect of the scalp posteriorly. There is mild hematoma in this area. The bony calvarium  appears intact. Minimal soft tissue air is noted in the scalp over the left frontal bone. Sinuses/Orbits: There is opacification of the right maxillary antrum as well as multiple right-sided ethmoid air cells. There is also opacification in the right frontal sinus region. There is evidence of previous antrostomies bilaterally. Orbits appear symmetric bilaterally. Other: Mastoid air cells are clear. CT CERVICAL SPINE FINDINGS Alignment: There is no appreciable spondylolisthesis. Skull base and vertebrae: The skull base and craniocervical junction regions appear normal. Bones are osteoporotic. No fracture evident. There are no blastic or lytic bone lesions. Soft tissues and spinal canal: Prevertebral soft tissues and predental space regions are within normal limits. No paraspinous lesions. No cord canal hematoma. Disc levels: There is severe disc space narrowing at C5-6, C6-7, and C7-T1. There is multilevel facet osteoarthritic change. There is exit foraminal narrowing due to bony hypertrophy at C2-3 on the left, at C3-4 on the left, at C4-5 on the left, to a lesser extent at C5-6 on the left, and  C6-7 on the left. There is marked impression on the exiting nerve root at C3-4 and C4-5 on the left due to bony hypertrophy. No disc extrusion or stenosis. Upper chest: Visualized upper lung zones are clear. Other: There is calcification in each subclavian and carotid artery. IMPRESSION: CT head: Atrophy with supratentorial small vessel disease. Prior small infarct in the head of the caudate nucleus on the right. No mass or hemorrhage. No acute infarct. There are foci of arterial vascular calcification. There are skin staples over the skull vertex. No fracture evident. Extensive paranasal sinus disease at multiple sites on the right side. CT cervical spine: No fracture or appreciable spondylolisthesis. Extensive multifocal arthropathy. Exit foraminal narrowing noted at multiple levels on the left, most severe at C3-4 and C4-5. There is calcification in both subclavian and carotid arteries. Electronically Signed   By: Lowella Grip III M.D.   On: 07/21/2018 07:26   Ct Angio Chest/abd/pel For Dissection W And/or Wo Contrast  Result Date: 07/21/2018 CLINICAL DATA:  GI bleed.  Fall. EXAM: CT ANGIOGRAPHY CHEST, ABDOMEN AND PELVIS TECHNIQUE: Multidetector CT imaging through the chest, abdomen and pelvis was performed using the standard protocol during bolus administration of intravenous contrast. Multiplanar reconstructed images and MIPs were obtained and reviewed to evaluate the vascular anatomy. CONTRAST:  67mL ISOVUE-370 IOPAMIDOL (ISOVUE-370) INJECTION 76% COMPARISON:  None. FINDINGS: CTA CHEST FINDINGS Cardiovascular: Severe diffuse aortic atherosclerosis. Irregular calcified and noncalcified plaque noted throughout the descending thoracic aorta. Probable penetrating ulcer noted along the posterior wall of the distal descending thoracic aorta. Cardiomegaly. Diffuse coronary artery calcifications. Pacer wires present in the right heart. No evidence of aortic dissection. Mediastinum/Nodes: No mediastinal,  hilar, or axillary adenopathy. Lungs/Pleura: Mild emphysema. Biapical scarring. Rounded airspace opacity noted at the right lung base posteriorly could reflect rounded pneumonia or atelectasis. No confluent opacity on the left. Musculoskeletal: No acute bony abnormality. Prior vertebroplasty at T12. Old right rib fractures noted. Review of the MIP images confirms the above findings. CTA ABDOMEN AND PELVIS FINDINGS VASCULAR Aorta: Irregular calcified and noncalcified plaque throughout the aorta. No evidence of aneurysm or dissection. Celiac: Widely patent, calcified. SMA: Widely patent, calcified Renals: Widely patent, calcified. IMA: Patent Inflow: Calcified iliac vessels. No aneurysm or dissection. 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 Veins: Grossly unremarkable. Review of the MIP images confirms the above findings. NON-VASCULAR Hepatobiliary: No focal hepatic abnormality. Gallbladder unremarkable. Pancreas: No focal abnormality or ductal dilatation. Spleen: No focal abnormality.  Normal size. Adrenals/Urinary Tract: Area of decreased enhancement noted posteriorly in the left kidney could reflect area of infarct. No hydronephrosis or suspicious renal mass. Adrenal glands and urinary bladder unremarkable. Stomach/Bowel: Colonic diverticulosis. No active diverticulitis. Stomach and small bowel decompressed, unremarkable. Large stool burden in the rectosigmoid colon. Lymphatic: No adenopathy Reproductive: Central calcifications within the prostate. Other: No free fluid or free air. Musculoskeletal: Severe compression deformities noted at L2 and L3, likely chronic. Prior vertebroplasty at T12. Diffuse degenerative changes. No acute bony abnormality. Review of the MIP images confirms the above findings. IMPRESSION: Diffuse irregular aortic atherosclerotic plaque. Possible ulcerative plaque in the distal descending thoracic aorta posteriorly. No evidence of aneurysm or dissection.  Cardiomegaly, coronary artery disease. Rounded airspace opacity posteriorly in the right lower lobe could reflect rounded pneumonia or atelectasis. Area of decreased enhancement posteriorly in the left kidney suspicious for renal infarct. Sigmoid diverticulosis. Moderate stool burden in the rectosigmoid colon. Chronic appearing compression deformities at L2 and L3. Prior vertebroplasty at T12. Electronically Signed   By: Rolm Baptise M.D.   On: 07/21/2018 07:40    EKG: Independently reviewed.  NSR with rate 60; RBBB with nonspecific ST changes with no evidence of acute ischemia; NSCSLT   Labs on Admission: I have personally reviewed the available labs and imaging studies at the time of the admission.  Pertinent labs:   Glucose 138 BUN 28/Creatinine 1.36/GFR 43; 24/1.15/58 in 4/19 Albumin 3.0 WBC 16.7 Hgb 11.6; 14.0 on 4/25 INR 1.19   Assessment/Plan Principal Problem:   Lower GI bleeding Active Problems:   Essential hypertension   SYNCOPE   Alcohol use   Orthostatic hypotension   Chronic combined systolic and diastolic CHF (congestive heart failure) ([facility])   Forehead laceration, initial encounter   CKD (chronic kidney disease) stage 3, GFR 30-59 ml/min ([facility])   Lower Gi Bleeding -Differential to include bleeding hemorrhoids, diverticular bleeding,and AVM.  -Inpatient admission -CBC q8h; transfuse for Hgb <7.  He was given 1 unit PRBC on admission based on persistent hypotension prior to and on arrival. -Continue to monitor for recurrent bleeding  -GI consult for possible C-scope -Clear liquids for now -Will hold Plavix  Syncope with resultant head lacerations -Likely related to hypotension in the setting of acute GI bleeding -He was persistently hypotensive with EMS and in the ER -Will monitor on telemetry x 24 hours - although low suspicion that this was cardiac in nature, and pacer interrogation was unremarkable -Hypotension responded to IVF and PRBC -Head lacerations (5  cm forehead, 6 cm occiput) have been repaired  Orthostatic hypotension with h/o HTN -Hold Coreg -Continue Midodrine  Chronic combined CHF -12/18 echo with EF 30-35% and grade 2 diastolic dysfunction -Appears to be compensated at this time -Will continue IVF at 75 cc/hr x 12 hours due to ongoing borderline low BP and then off to try to prevent volume overload  CKD -Appears to be stable at this time -Will follow  ETOH use -h/o heavy use -Reports "not drinking" now, only 1+ daily -Will monitor for s/sx ETOH withdrawal    DVT prophylaxis:  SCDs Code Status:  DNR - confirmed with patient Family Communication: None present Disposition Plan:  Home once clinically improved Consults called: GI  Admission status: Admit - It is my clinical opinion that admission to [facility] is reasonable and necessary because of the expectation that this patient will require hospital care that crosses at least 2 midnights to treat this condition based on the medical complexity of the problems  presented.  Given the aforementioned information, the predictability of an adverse outcome is felt to be significant.     Karmen Bongo MD [facility]  If note is complete, please contact covering daytime or nighttime physician. [email or web address] Password [email or web address]  07/21/2018, 10:38 AM

## 2018-07-21 NOTE — ED Provider Notes (Signed)
TIME SEEN: 5:16 AM  CHIEF COMPLAINT: Rectal bleeding, syncope, head injury  HPI: Patient is an 84 year old male with history of CAD on Plavix, CHF, hypertension, ventricular tachycardia status post pacemaker/ICD, AAA status post repair in 2002 who presents to the emergency department after syncopal event.  States that he got up in the middle the night to the bathroom and started having bright red blood per rectum.  Stood up from the toilet and passed out striking his head on the sink.  Has 2 lacerations to his head and a skin tear to the left wrist.  States he is concerned that something may have happened to his aneurysm repair.  No history of previous GI bleed.  Last colonoscopy was documented in 2006.  States he was previously seen by Dr. Sharlett Iles.  States his previous colonoscopy showed polyps that were removed.  Denies chest pain or shortness of breath.  States he felt lightheaded when he stood up.  No abdominal pain.  No back pain.  Patient comes in with EMS.  Was found to be hypotensive.  Given 500 mL of IV fluid.  Normal blood glucose with EMS.   ROS: See HPI Constitutional: no fever  Eyes: no drainage  ENT: no runny nose   Cardiovascular:  no chest pain  Resp: no SOB  GI: no vomiting GU: no dysuria Integumentary: no rash  Allergy: no hives  Musculoskeletal: no leg swelling  Neurological: no slurred speech ROS otherwise negative  PAST MEDICAL HISTORY/PAST SURGICAL HISTORY:  Past Medical History:  Diagnosis Date  . AAA (abdominal aortic aneurysm) ([place])    a. 2002 s/p repair.  . Allergic rhinitis   . Back pain   . CAD (coronary artery disease)    a. 02/2002 H/o MI with stenting x 2; b. 04/2013 MV: EF 25%, large posterior lateral infarct w/o ischemia; c. 03/2016 VT Arrest/Cath: LM 60ost, LAD 40p/m ISR, LCX 100p/m, RCA nl-->Med Rx.  . Carotid arterial disease ([place])    a. 12/2010 s/p R CEA;  b. 05/2015 Carotid U/S: bilat <40% ICA stenosis.  . Chronic combined systolic and diastolic CHF  (congestive heart failure) ([place])    a. 03/2016 Echo: Ef 35-40%, grade 1 DD.  Marland Kitchen Compression fracture   . COPD (chronic obstructive pulmonary disease) ([place])   . Dermatitis   . Diverticulosis of colon   . DJD (degenerative joint disease)    and Gout  . Hemorrhoids   . History of colonic polyps   . History of gout   . History of pneumonia   . Hypercholesterolemia   . Hypertension   . Hypertensive heart disease   . Ischemic cardiomyopathy    a. EF prev <35%-->improved to normal by Echo 8/14:  Mild LVH, focal basal hypertrophy, EF 60-65%, normal wall motion, mild BAE, PASP 36; c. 03/2016 Echo: EF 35-40%, Gr1 DD, triv AI, mild MR, mod dil LA, mild-mod TR, PASP 57mmHg.  . Osteoporosis   . Peripheral vascular disease ([place])   . Presence of cardiac defibrillator    a. 03/2008 s/p MDT [id number] Maximo II DR, DC AICD; b. 03/2013: ICD shock for T wave oversensing;  c. 10/2015: collective decision not to replace ICD given improvement in LV fxn; d. VT Arrest-->Gen change to MDT ser # [id number].  . Skin cancer    shoulders and forehead  . Syncope   . T wave over sensing resulting in inappropriate shocks    a. 03/2013.  . Tobacco abuse   . Ventricular tachycardia ([place]) 04/01/2016  MEDICATIONS:  Prior to Admission medications   Medication Sig Start Date End Date Taking? Authorizing Provider  albuterol (PROVENTIL HFA;VENTOLIN HFA) 108 (90 Base) MCG/ACT inhaler Inhale 2 puffs into the lungs every 6 (six) hours as needed for wheezing or shortness of breath. 01/14/17   Debbe Odea, MD  alendronate (FOSAMAX) 70 MG tablet Take 1 tablet (70 mg total) by mouth once a week. Take with a full glass of water on an empty stomach. 03/13/18   Bo Merino, MD  allopurinol (ZYLOPRIM) 100 MG tablet TAKE 1 TABLET ONCE DAILY. 06/09/18   Noralee Space, MD  amiodarone (PACERONE) 200 MG tablet TAKE 1 TABLET ONCE DAILY. 06/16/18   Noralee Space, MD  amoxicillin-clavulanate (AUGMENTIN) 875-125 MG tablet Take 1 tablet by  mouth 2 (two) times daily. 07/16/18   Noralee Space, MD  azithromycin (ZITHROMAX) 250 MG tablet Take as directed 07/15/18   Noralee Space, MD  carvedilol (COREG) 3.125 MG tablet TAKE 1 TABLET BY MOUTH TWICE DAILY WITH A MEAL. 06/03/18   Minus Breeding, MD  cholecalciferol (VITAMIN D) 1000 units tablet Take 1,000 Units by mouth daily.    [provider]  clopidogrel (PLAVIX) 75 MG tablet TAKE 1 TABLET ONCE DAILY. 06/30/18   Noralee Space, MD  fluticasone (CUTIVATE) 0.05 % cream  02/26/18   [provider]  fluticasone-salmeterol (ADVAIR HFA) 115-21 MCG/ACT inhaler Inhale 2 puffs into the lungs 2 (two) times daily. 01/14/18   Noralee Space, MD  furosemide (LASIX) 20 MG tablet Take 1 tablet Monday, Wednesday and Friday 11/13/17   Erlene Quan, PA-C  guaiFENesin (MUCINEX) 600 MG 12 hr tablet Take 600-1,200 mg by mouth 2 (two) times daily. Takes two in the morning and one in the evening.    [provider]  levothyroxine (SYNTHROID, LEVOTHROID) 50 MCG tablet Take 50 mcg by mouth daily before breakfast.    [provider]  magnesium oxide (MAG-OX) 400 (241.3 Mg) MG tablet Take 0.5 tablets (200 mg total) by mouth daily. 04/27/16   Love, Ivan Anchors, PA-C  methylPREDNISolone (MEDROL DOSEPAK) 4 MG TBPK tablet 6 day pak-take as directed 07/15/18   Noralee Space, MD  midodrine (PROAMATINE) 2.5 MG tablet TAKE 1 TABLET 3 TIMES DAILY WITH MEALS. 06/02/18   Minus Breeding, MD  Multiple Vitamin (MULTIVITAMIN) capsule Take 1 capsule by mouth daily.    [provider]  oxybutynin (DITROPAN) 5 MG tablet TAKE 1 TABLET BY MOUTH TWICE DAILY. 04/28/18   Noralee Space, MD  polyethylene glycol powder (QC NATURA-LAX) powder 1 capful in 8oz water twice daily Patient taking differently: 1 capful in [place] water as needed 03/20/18   Noralee Space, MD  potassium chloride (K-DUR) 10 MEQ tablet Take 1 tablet Monday, Wednesday and Friday 11/13/17   Erlene Quan, PA-C  pravastatin (PRAVACHOL) 40  MG tablet TAKE 1 TABLET ONCE DAILY. 04/21/18   Noralee Space, MD  sertraline (ZOLOFT) 50 MG tablet TAKE ONE TABLET AT BEDTIME. 06/30/18   Noralee Space, MD  thiamine 100 MG tablet Take 1 tablet (100 mg total) by mouth daily. 04/26/16   Love, Ivan Anchors, PA-C  traMADol (ULTRAM) 50 MG tablet Take 1 tablet (50 mg total) by mouth every 6 (six) hours as needed for severe pain. 10/14/17   Robbie Lis, MD    ALLERGIES:  Allergies  Allergen Reactions  . Lisinopril     BP dropped too low per daughter    SOCIAL HISTORY:  Social History   Tobacco Use  . Smoking status: Former Smoker    Packs/day: 1.50    Years: 65.00    Pack years: 97.50    Types: Cigarettes, Cigars, Pipe    Last attempt to quit: 03/13/2008    Years since quitting: 10.3  . Smokeless tobacco: Never Used  . Tobacco comment: quit about 10 years ago  Substance Use Topics  . Alcohol use: Not Currently    Alcohol/week: 0.0 standard drinks    FAMILY HISTORY: Family History  Problem Relation Age of Onset  . Hypertension Father   . Heart disease Father        Heart Disease before age 72  . Hypertension Mother   . Heart disease Mother        Heart Disease before age 50  . Cancer Mother   . Scoliosis Sister   . Thyroid disease Daughter   . Anxiety disorder Son     EXAM: BP (!) 79/53 (BP Location: Right Arm)   Pulse 60   Temp 97.6 F (36.4 C) (Oral)   Resp 18   Ht 5\' 11"  (1.803 m)   Wt 80.7 kg   SpO2 98%   BMI 24.83 kg/m  CONSTITUTIONAL: Alert and oriented and responds appropriately to questions.  Elderly, appears pale HEAD: Normocephalic; 6 cm scalp laceration, 5 cm left forehead laceration EYES: Conjunctivae clear, PERRL, EOMI ENT: normal nose; no rhinorrhea; moist mucous membranes; pharynx without lesions noted; no dental injury; no septal hematoma NECK: Supple, no meningismus, no LAD; no midline spinal tenderness, step-off or deformity; trachea midline, cervical collar in place CARD: RRR; S1 and S2 appreciated;  no murmurs, no clicks, no rubs, no gallops RESP: Normal chest excursion without splinting or tachypnea; breath sounds clear and equal bilaterally; no wheezes, no rhonchi, no rales; no hypoxia or respiratory distress CHEST:  chest wall stable, no crepitus or ecchymosis or deformity, nontender to palpation; no flail chest ABD/GI: Normal bowel sounds; non-distended; soft, non-tender, no rebound, no guarding; no ecchymosis or other lesions noted RECTAL: Large amount of gross red blood on rectal exam, no melena PELVIS:  stable, nontender to palpation BACK:  The back appears normal and is non-tender to palpation, there is no CVA tenderness; no midline spinal tenderness, step-off or deformity EXT: 2 small Skin tears noted to patient's dorsal left wrist.  Normal ROM in all joints; non-tender to palpation; no edema; normal capillary refill; no cyanosis, no bony tenderness or bony deformity of patient's extremities, no joint effusion, compartments are soft, extremities are warm and well-perfused, no ecchymosis SKIN: Normal color for age and race; warm NEURO: Moves all extremities equally, normal sensation diffusely, cranial nerves II through XII intact, normal speech PSYCH: The patient's mood and manner are appropriate. Grooming and personal hygiene are appropriate.  MEDICAL DECISION MAKING: Patient here with syncopal event, hypertension, rectal bleeding.  Has a large amount of rectal bleeding on examination.  Will give emergent blood, 500 cc IV fluid bolus.  Will give IV fluids cautiously given patient's history of cardiomyopathy with EF of 35%.  Will obtain labs, CT of his head and cervical spine.  We will also obtain CT of his chest, abdomen and pelvis to further evaluate his aneurysm repair and to evaluate for fistula.  ED PROGRESS: Patient's blood pressure has significantly improved with 1 unit of emergent blood.  Hemoglobin currently 11.6.   ICD was interrogated.  No events since December 2018 (atrial  arrhythmia).   Patient's labs reassuring.  He has  a leukocytosis which may be reactive.  I have reviewed patient's CT imaging and do not see any acute findings.  PCP is Dr. Lenna Gilford with [facility].  Will admit to hospitalist service.  No further bloody bowel movements in the emergency department.  7:34 AM Discussed patient's case with hospitalist, Dr. Lorin Mercy.  I have recommended admission and patient (and family if present) agree with this plan. Admitting physician will place admission orders.   I reviewed all nursing notes, vitals, pertinent previous records, EKGs, lab and urine results, imaging (as available).    EKG Interpretation  Date/Time:  Monday July 21 2018 05:22:32 EDT Ventricular Rate:  60 PR Interval:    QRS Duration: 169 QT Interval:  549 QTC Calculation: 549 R Axis:   89 Text Interpretation:  Sinus rhythm Probable left atrial enlargement Right bundle branch block Borderline ST depression, lateral leads No significant change since last tracing Confirmed by Kavion Mancinas, Cyril Mourning [phone]) on 07/21/2018 5:36:29 AM        CRITICAL CARE Performed by: Cyril Mourning Buckley Bradly   Total critical care time: 55 minutes  Critical care time was exclusive of separately billable procedures and treating other patients.  Critical care was necessary to treat or prevent imminent or life-threatening deterioration.  Critical care was time spent personally by me on the following activities: development of treatment plan with patient and/or surrogate as well as nursing, discussions with consultants, evaluation of patient's response to treatment, examination of patient, obtaining history from patient or surrogate, ordering and performing treatments and interventions, ordering and review of laboratory studies, ordering and review of radiographic studies, pulse oximetry and re-evaluation of patient's condition.    Tierria Watson, Delice Bison, DO 07/21/18 [phone]

## 2018-07-21 NOTE — Progress Notes (Signed)
Received report from Trudee Kuster, Therapist, sports. Pt in IR for GI scan at time of assessment. Will assess upon return to unit. Clint Bolder, RN 07/21/18 8:33 PM

## 2018-07-21 NOTE — ED Notes (Signed)
Pt given sprite 

## 2018-07-21 NOTE — Progress Notes (Signed)
Pt returned from nuclear medicine. Pt was transferred to a low bed with floor mats in place since pt is a high fall and bleeding risk. Pt is A and Ox4, NAD, VSS.  Will continue to monitor. Clint Bolder, RN 07/21/18  11:21 PM

## 2018-07-21 NOTE — Consult Note (Addendum)
[facility] Consult: 10:10 AM 07/21/2018  LOS: 0 days    Referring Provider: Dr. Lorin Mercy Primary Care Physician:  Noralee Space, MD Primary Gastroenterologist:  Dr. Verl Blalock    Reason for Consultation: Hematochezia   HPI: [name] is a 85 y.o. male.  PMH CAD, status post cardiac stenting. Ischemic CM, EF 30 - 95%, grade 2 diastolic dysfunction per Echo 09/2017 .  S/p ICD 2009.  VT/VF arrest 03/2016 at which time ICD generator was replaced.  Orthostatic hypotension spring/2019 chronic Plavix.  PVD. S/p AAA repair, CEA.   COPD with chronic dyspnea.  Early CKD.  Spinal compression fractures.  Previous colonoscopies 1988 -08/2005.  Hx adenomatous (last in 1993) and HP polyps.  At the latest, 08/2005 colonoscopy he had multiple small, hyperplastic polyps removed.  Pan-diverticulosis.  Internal hemorrhoids.  Recently completed steroid Dosepak and antibiotics for pulmonary congestion  Last night ~1030, patient moved his bowels in the dark.  They were loose.  He did not see what the stool looked like.  At about 2 AM he woke up needing to move his bowels.  He got to the bathroom but had a syncopal spell, fell, hit his head.  When he woke up he was surrounded by red blood.  He did not have any nausea or abdominal pain. Hypotensive per EMS.  IV fluids were initiated On arrival to the ED he received PRBC x 1.  He felt better and continues to feel well.  Had any more episodes of hematochezia.  He is never had rectal bleeding even minor.  Home meds include Plavix and Mobic.  Definitely took his Plavix yesterday but does not think he took Mobic. Hgb in April was 14.  It was 11.6 at arrival, 11.9 today after transfusion. MCV 101. AKI present.  Troponins not elevated.  LFTs normal. Head, neck CT shows small vessel  disease, atrophy, old infarct.  Paranasal sinusitis.  Cervical stenosis and arthropathy.      Past Medical History:  Diagnosis Date  . AAA (abdominal aortic aneurysm) ([place])    a. 2002 s/p repair.  . Allergic rhinitis   . Back pain   . CAD (coronary artery disease)    a. 02/2002 H/o MI with stenting x 2; b. 04/2013 MV: EF 25%, large posterior lateral infarct w/o ischemia; c. 03/2016 VT Arrest/Cath: LM 60ost, LAD 40p/m ISR, LCX 100p/m, RCA nl-->Med Rx.  . Carotid arterial disease ([place])    a. 12/2010 s/p R CEA;  b. 05/2015 Carotid U/S: bilat <40% ICA stenosis.  . Chronic combined systolic and diastolic CHF (congestive heart failure) ([place])    a. 03/2016 Echo: Ef 35-40%, grade 1 DD.  Marland Kitchen Compression fracture   . COPD (chronic obstructive pulmonary disease) ([place])   . Dermatitis   . Diverticulosis of colon   . DJD (degenerative joint disease)    and Gout  . Hemorrhoids   . History of colonic polyps   . History of gout   . History of pneumonia   . Hypercholesterolemia   . Hypertension   .  Hypertensive heart disease   . Ischemic cardiomyopathy    a. EF prev <35%-->improved to normal by Echo 8/14:  Mild LVH, focal basal hypertrophy, EF 60-65%, normal wall motion, mild BAE, PASP 36; c. 03/2016 Echo: EF 35-40%, Gr1 DD, triv AI, mild MR, mod dil LA, mild-mod TR, PASP 76mmHg.  . Osteoporosis   . Peripheral vascular disease ([place])   . Presence of cardiac defibrillator    a. 03/2008 s/p MDT [id number] Maximo II DR, DC AICD; b. 03/2013: ICD shock for T wave oversensing;  c. 10/2015: collective decision not to replace ICD given improvement in LV fxn; d. VT Arrest-->Gen change to MDT ser # [id number].  . Skin cancer    shoulders and forehead  . Syncope   . T wave over sensing resulting in inappropriate shocks    a. 03/2013.  . Tobacco abuse   . Ventricular tachycardia ([place]) 04/01/2016    Past Surgical History:  Procedure Laterality Date  . ABDOMINAL AORTIC ANEURYSM REPAIR  2002   by Dr. Kellie Simmering  . aicd  placed  03/2008   Dr. Caryl Comes  . cad stent  02/2002   Dr. Percival Spanish  . CARDIAC CATHETERIZATION     X 2 stents  . CARDIAC CATHETERIZATION N/A 04/03/2016   Procedure: Left Heart Cath and Coronary Angiography;  Surgeon: Belva Crome, MD;  Location: [facility];  Service: Cardiovascular;  Laterality: N/A;  . CARDIAC DEFIBRILLATOR PLACEMENT  2009  . CARDIAC DEFIBRILLATOR PLACEMENT    . CAROTID ENDARTERECTOMY Right January 02, 2011  . EAR CYST EXCISION Left 12/13/2015   Procedure: Excision left ear lesion ;  Surgeon: Melissa Montane, MD;  Location: [facility];  Service: ENT;  Laterality: Left;  . EP IMPLANTABLE DEVICE N/A 04/06/2016   Procedure:  ICD Generator Changeout;  Surgeon: Deboraha Sprang, MD;  Location: [facility];  Service: Cardiovascular;  Laterality: N/A;  . EXCISION OF LESION LEFT EAR Left 12/13/2015  . I&D EXTREMITY Left 04/23/2016   Procedure: IRRIGATION AND DEBRIDEMENT HAND;  Surgeon: Leandrew Koyanagi, MD;  Location: [facility];  Service: Orthopedics;  Laterality: Left;  . IR IMAGE GUIDED DRAINAGE BY PERCUTANEOUS CATHETER  10/08/2017  . OPEN REDUCTION INTERNAL FIXATION (ORIF) METACARPAL Left 04/04/2016   Procedure: OPEN REDUCTION INTERNAL FIXATION (ORIF) LEFT 2ND, 3RD, 4TH METACARPAL FRACTURE;  Surgeon: Leandrew Koyanagi, MD;  Location: [facility];  Service: Orthopedics;  Laterality: Left;  OPEN REDUCTION INTERNAL FIXATION (ORIF) LEFT 2ND, 3RD, 4TH METACARPAL FRACTURE  . OPEN REDUCTION INTERNAL FIXATION (ORIF) METACARPAL Left 04/23/2016   Procedure: REVISION OPEN REDUCTION INTERNAL FIXATION (ORIF) 2ND METACARPAL;  Surgeon: Leandrew Koyanagi, MD;  Location: [facility];  Service: Orthopedics;  Laterality: Left;  . right corotid enderectomy  12/2010   Dr. Scot Dock  . SKIN SPLIT GRAFT Left 12/13/2015   Procedure: with possible skin graft;  Surgeon: Melissa Montane, MD;  Location: [facility];  Service: ENT;  Laterality: Left;  . TONSILLECTOMY      Prior to Admission medications   Medication Sig Start Date End Date Taking?  Authorizing Provider  albuterol (PROVENTIL HFA;VENTOLIN HFA) 108 (90 Base) MCG/ACT inhaler Inhale 2 puffs into the lungs every 6 (six) hours as needed for wheezing or shortness of breath. 01/14/17  Yes Rizwan, Eunice Blase, MD  alendronate (FOSAMAX) 70 MG tablet Take 1 tablet (70 mg total) by mouth once a week. Take with a full glass of water on an empty stomach. 03/13/18  Yes Deveshwar, Abel Presto, MD  allopurinol (ZYLOPRIM) 100  MG tablet TAKE 1 TABLET ONCE DAILY. Patient taking differently: Take 100 mg by mouth daily.  06/09/18  Yes Noralee Space, MD  amiodarone (PACERONE) 200 MG tablet TAKE 1 TABLET ONCE DAILY. Patient taking differently: Take 200 mg by mouth daily.  06/16/18  Yes Noralee Space, MD  amoxicillin-clavulanate (AUGMENTIN) 875-125 MG tablet Take 1 tablet by mouth 2 (two) times daily.   Yes [provider]  carvedilol (COREG) 3.125 MG tablet TAKE 1 TABLET BY MOUTH TWICE DAILY WITH A MEAL. Patient taking differently: Take 3.125 mg by mouth See admin instructions. Takes if the systolic the blood pressure is over 100 06/03/18  Yes Hochrein, Jeneen Rinks, MD  cholecalciferol (VITAMIN D) 1000 units tablet Take 1,000 Units by mouth daily.   Yes [provider]  clopidogrel (PLAVIX) 75 MG tablet TAKE 1 TABLET ONCE DAILY. Patient taking differently: Take 75 mg by mouth daily.  06/30/18  Yes Noralee Space, MD  fluticasone (CUTIVATE) 0.05 % cream Apply 1 application topically 2 (two) times daily.  02/26/18  Yes [provider]  fluticasone-salmeterol (ADVAIR HFA) 115-21 MCG/ACT inhaler Inhale 2 puffs into the lungs 2 (two) times daily. 01/14/18  Yes Noralee Space, MD  furosemide (LASIX) 20 MG tablet Take 1 tablet Monday, Wednesday and Friday Patient taking differently: Take 20 mg by mouth every Monday, Wednesday, and Friday.  11/13/17  Yes Kilroy, Luke K, PA-C  guaiFENesin (MUCINEX) 600 MG 12 hr tablet Take 600 mg by mouth 2 (two) times daily.    Yes [provider]  levothyroxine  (SYNTHROID, LEVOTHROID) 50 MCG tablet Take 50 mcg by mouth daily before breakfast.   Yes [provider]  magnesium oxide (MAG-OX) 400 (241.3 Mg) MG tablet Take 0.5 tablets (200 mg total) by mouth daily. 04/27/16  Yes Love, Ivan Anchors, PA-C  midodrine (PROAMATINE) 2.5 MG tablet TAKE 1 TABLET 3 TIMES DAILY WITH MEALS. Patient taking differently: Take 2.5 mg by mouth 3 (three) times daily with meals.  06/02/18  Yes Minus Breeding, MD  Multiple Vitamin (MULTIVITAMIN) capsule Take 1 capsule by mouth daily.   Yes [provider]  Multiple Vitamins-Minerals (PRESERVISION AREDS 2 PO) Take 1 capsule by mouth daily.   Yes [provider]  oxybutynin (DITROPAN) 5 MG tablet TAKE 1 TABLET BY MOUTH TWICE DAILY. Patient taking differently: Take 5 mg by mouth 2 (two) times daily.  04/28/18  Yes Noralee Space, MD  polyethylene glycol powder (QC NATURA-LAX) powder 1 capful in 8oz water twice daily Patient taking differently: 1 capful in [place] water as needed 03/20/18  Yes Noralee Space, MD  potassium chloride (K-DUR) 10 MEQ tablet Take 1 tablet Monday, Wednesday and Friday Patient taking differently: Take 10 mEq by mouth every Monday, Wednesday, and Friday.  11/13/17  Yes Kilroy, Luke K, PA-C  pravastatin (PRAVACHOL) 40 MG tablet TAKE 1 TABLET ONCE DAILY. Patient taking differently: Take 40 mg by mouth daily.  04/21/18  Yes Noralee Space, MD  sertraline (ZOLOFT) 50 MG tablet TAKE ONE TABLET AT BEDTIME. Patient taking differently: Take 50 mg by mouth at bedtime.  06/30/18  Yes Noralee Space, MD  thiamine 100 MG tablet Take 1 tablet (100 mg total) by mouth daily. 04/26/16  Yes Love, Ivan Anchors, PA-C  traMADol (ULTRAM) 50 MG tablet Take 1 tablet (50 mg total) by mouth every 6 (six) hours as needed for severe pain. 10/14/17  Yes Robbie Lis, MD  methylPREDNISolone (MEDROL DOSEPAK) 4 MG TBPK tablet Take  4 mg by mouth.    [provider]    Scheduled Meds: . allopurinol  100 mg Oral Daily  .  amiodarone  200 mg Oral Daily  . clopidogrel  75 mg Oral Daily  . guaiFENesin  600-1,200 mg Oral BID  . levothyroxine  50 mcg Oral QAC breakfast  . magnesium oxide  200 mg Oral Daily  . midodrine  2.5 mg Oral TID WC  . mometasone-formoterol  2 puff Inhalation BID  . multivitamin  1 capsule Oral Daily  . oxybutynin  5 mg Oral BID  . pravastatin  40 mg Oral Daily  . sertraline  50 mg Oral QHS  . thiamine  100 mg Oral Daily   Infusions: . lactated ringers 75 mL/hr (07/21/18 0938)   PRN Meds: acetaminophen **OR** acetaminophen, albuterol, ondansetron **OR** ondansetron (ZOFRAN) IV, traMADol   Allergies as of 07/21/2018 - Review Complete 07/21/2018  Allergen Reaction Noted  . Lisinopril  04/27/2016    Family History  Problem Relation Age of Onset  . Hypertension Father   . Heart disease Father        Heart Disease before age 70  . Hypertension Mother   . Heart disease Mother        Heart Disease before age 80  . Cancer Mother   . Scoliosis Sister   . Thyroid disease Daughter   . Anxiety disorder Son     Social History   Socioeconomic History  . Marital status: Married    Spouse name: Not on file  . Number of children: 3  . Years of education: Not on file  . Highest education level: Not on file  Occupational History  . Occupation: laywer    Comment: retired  Scientific laboratory technician  . Financial resource strain: Not on file  . Food insecurity:    Worry: Not on file    Inability: Not on file  . Transportation needs:    Medical: Not on file    Non-medical: Not on file  Tobacco Use  . Smoking status: Former Smoker    Packs/day: 1.50    Years: 65.00    Pack years: 97.50    Types: Cigarettes, Cigars, Pipe    Last attempt to quit: 03/13/2008    Years since quitting: 10.3  . Smokeless tobacco: Never Used  . Tobacco comment: quit about 10 years ago  Substance and Sexual Activity  . Alcohol use: Yes    Alcohol/week: 2.0 standard drinks    Types: 1 Cans of beer, 1 Glasses of  wine per week    Comment: h/o heavy use, but "quit" drinking and now only drinks 1-3 daily  . Drug use: No  . Sexual activity: Not on file  Lifestyle  . Physical activity:    Days per week: Not on file    Minutes per session: Not on file  . Stress: Not on file  Relationships  . Social connections:    Talks on phone: Not on file    Gets together: Not on file    Attends religious service: Not on file    Active member of club or organization: Not on file    Attends meetings of clubs or organizations: Not on file    Relationship status: Not on file  . Intimate partner violence:    Fear of current or ex partner: Not on file    Emotionally abused: Not on file    Physically abused: Not on file    Forced sexual activity:  Not on file  Other Topics Concern  . Not on file  Social History Narrative   ** Merged History Encounter **       Lives locally.  Had been living with wife who had become quite ill recently and died on the evening of March 29, 2016.    REVIEW OF SYSTEMS: Constitutional: Uses a walker because he has unsteady gait and a history of falls but until early this morning had not had a fall in a while. ENT:  No nose bleeds Pulm: Nighttime oxygen chronically.  No new cough.  No new dyspnea. CV:  No palpitations, no LE edema.  No chest pain.  No PND, no orthopnea. GU:  No hematuria, no frequency GI: Generally has a good appetite.  Does not suffer from constipation or diarrhea.  No history of rectal bleeding. Heme: Bleeds bruises somewhat easily. Transfusions: None per his recall. Neuro:  No headaches, no peripheral tingling or numbness Derm: Has scratches on his lower legs from where his small dog scratches him for attention. Endocrine:  No sweats or chills.  No polyuria or dysuria Immunization: Vaccinations/immunizations reviewed. Travel:  None beyond local counties in last few months.    PHYSICAL EXAM: Vital signs in last 24 hours: Vitals:   07/21/18 0900 07/21/18 0915    BP: 126/64 (!) 107/59  Pulse: (!) 59 (!) 58  Resp: 12 11  Temp:    SpO2: 100% 100%   Wt Readings from Last 3 Encounters:  07/21/18 80.7 kg  07/15/18 80.7 kg  06/10/18 79.8 kg    General: Pleasant, alert, comfortable WM. Head: White blood on the left scalp, forehead and a little bit in the ear.  Sutures in laceration on left forehead. Eyes: Some lacerations, dried blood in the eyebrow.  No scleral icterus.  No conjunctival pallor.  EOMI. Ears: Hard of hearing. Nose: No congestion or discharge. Mouth: Pharynx moist, pink, clear.  Tongue midline.  No blood in the mouth.  Teeth in fair condition, some of them are missing. Neck: JVD, no masses, no thyromegaly. Lungs: No shortness of breath, no cough.  Lungs clear bilaterally. Heart: RRR.  No MRG.  S1, S2 present. Abdomen: Soft.  Not tender or distended.  No HSM, masses, bruits, hernias..   Rectal: Deferred rectal exam.  Was performed by MD in the emergency department and notable for large amount of gross red blood, not melena.  No masses. Musc/Skeltl: No swelling in the limbs.  Slight arthritic changes in the knees and in the fingers/hands. Extremities: No CCE. Neurologic: Oriented to place, time, situation.  Moves all 4 limbs, strength not tested but no tremor present. Skin: Linear scabbed over cuts on both of his shins.  Lacerations on his head as above. Tattoos: None observed Nodes: No cervical adenopathy. Psych: Calm, pleasant, cooperative.  Fluid speech.  Intake/Output from previous day: 09/29 0701 - 09/30 0700 In: 500 [IV Piggyback:500] Out: -  Intake/Output this shift: Total I/O In: 351 [I.V.:351] Out: -   LAB RESULTS: Recent Labs    07/21/18 0531 07/21/18 0542  WBC 16.7*  --   HGB 11.6* 11.9*  HCT 35.9* 35.0*  PLT 172  --    BMET Lab Results  Component Value Date   NA 140 07/21/2018   NA 138 07/21/2018   NA 138 02/13/2018   K 4.4 07/21/2018   K 4.4 07/21/2018   K 5.1 02/13/2018   CL 102 07/21/2018   CL  104 07/21/2018   CL 103 02/13/2018   CO2  25 07/21/2018   CO2 32 02/13/2018   CO2 24 02/10/2018   GLUCOSE 132 (H) 07/21/2018   GLUCOSE 138 (H) 07/21/2018   GLUCOSE 80 02/13/2018   BUN 30 (H) 07/21/2018   BUN 28 (H) 07/21/2018   BUN 24 02/13/2018   CREATININE 1.40 (H) 07/21/2018   CREATININE 1.36 (H) 07/21/2018   CREATININE 1.15 (H) 02/13/2018   CALCIUM 8.3 (L) 07/21/2018   CALCIUM 9.5 02/13/2018   CALCIUM 8.9 02/10/2018   LFT Recent Labs    07/21/18 0531  PROT 6.0*  ALBUMIN 3.0*  AST 39  ALT 38  ALKPHOS 68  BILITOT 0.7   PT/INR Lab Results  Component Value Date   INR 1.19 07/21/2018   INR 1.15 10/08/2017   INR 1.28 03/29/2016   Hepatitis Panel No results for input(s): HEPBSAG, HCVAB, HEPAIGM, HEPBIGM in the last 72 hours. C-Diff No components found for: CDIFF Lipase  No results found for: LIPASE  Drugs of Abuse  No results found for: LABOPIA, COCAINSCRNUR, LABBENZ, AMPHETMU, THCU, LABBARB   RADIOLOGY STUDIES: Ct Head Wo Contrast  Result Date: 07/21/2018 CLINICAL DATA:  Pain following fall EXAM: CT HEAD WITHOUT CONTRAST CT CERVICAL SPINE WITHOUT CONTRAST TECHNIQUE: Multidetector CT imaging of the head and cervical spine was performed following the standard protocol without intravenous contrast. Multiplanar CT image reconstructions of the cervical spine were also generated. CT HEAD FINDINGS Brain: There is moderate diffuse atrophy. There is no intracranial mass, hemorrhage, extra-axial fluid collection, or midline shift. There is decreased attenuation throughout much of the centra semiovale bilaterally, felt to represent supratentorial small vessel disease. There is evidence of a prior small infarct in the head of the caudate nucleus on the right. No acute appearing infarct is evident. Vascular: No hyperdense vessels are evident. There is calcification in each distal vertebral artery as well as in the basilar artery and both carotid siphon regions. Skull: There are skin  staples overlying the superior most aspect of the scalp posteriorly. There is mild hematoma in this area. The bony calvarium appears intact. Minimal soft tissue air is noted in the scalp over the left frontal bone. Sinuses/Orbits: There is opacification of the right maxillary antrum as well as multiple right-sided ethmoid air cells. There is also opacification in the right frontal sinus region. There is evidence of previous antrostomies bilaterally. Orbits appear symmetric bilaterally. Other: Mastoid air cells are clear. CT CERVICAL SPINE FINDINGS Alignment: There is no appreciable spondylolisthesis. Skull base and vertebrae: The skull base and craniocervical junction regions appear normal. Bones are osteoporotic. No fracture evident. There are no blastic or lytic bone lesions. Soft tissues and spinal canal: Prevertebral soft tissues and predental space regions are within normal limits. No paraspinous lesions. No cord canal hematoma. Disc levels: There is severe disc space narrowing at C5-6, C6-7, and C7-T1. There is multilevel facet osteoarthritic change. There is exit foraminal narrowing due to bony hypertrophy at C2-3 on the left, at C3-4 on the left, at C4-5 on the left, to a lesser extent at C5-6 on the left, and C6-7 on the left. There is marked impression on the exiting nerve root at C3-4 and C4-5 on the left due to bony hypertrophy. No disc extrusion or stenosis. Upper chest: Visualized upper lung zones are clear. Other: There is calcification in each subclavian and carotid artery. IMPRESSION: CT head: Atrophy with supratentorial small vessel disease. Prior small infarct in the head of the caudate nucleus on the right. No mass or hemorrhage. No acute infarct. There are  foci of arterial vascular calcification. There are skin staples over the skull vertex. No fracture evident. Extensive paranasal sinus disease at multiple sites on the right side. CT cervical spine: No fracture or appreciable spondylolisthesis.  Extensive multifocal arthropathy. Exit foraminal narrowing noted at multiple levels on the left, most severe at C3-4 and C4-5. There is calcification in both subclavian and carotid arteries. Electronically Signed   By: Lowella Grip III M.D.   On: 07/21/2018 07:26   Ct Cervical Spine Wo Contrast  Result Date: 07/21/2018 CLINICAL DATA:  Pain following fall EXAM: CT HEAD WITHOUT CONTRAST CT CERVICAL SPINE WITHOUT CONTRAST TECHNIQUE: Multidetector CT imaging of the head and cervical spine was performed following the standard protocol without intravenous contrast. Multiplanar CT image reconstructions of the cervical spine were also generated. CT HEAD FINDINGS Brain: There is moderate diffuse atrophy. There is no intracranial mass, hemorrhage, extra-axial fluid collection, or midline shift. There is decreased attenuation throughout much of the centra semiovale bilaterally, felt to represent supratentorial small vessel disease. There is evidence of a prior small infarct in the head of the caudate nucleus on the right. No acute appearing infarct is evident. Vascular: No hyperdense vessels are evident. There is calcification in each distal vertebral artery as well as in the basilar artery and both carotid siphon regions. Skull: There are skin staples overlying the superior most aspect of the scalp posteriorly. There is mild hematoma in this area. The bony calvarium appears intact. Minimal soft tissue air is noted in the scalp over the left frontal bone. Sinuses/Orbits: There is opacification of the right maxillary antrum as well as multiple right-sided ethmoid air cells. There is also opacification in the right frontal sinus region. There is evidence of previous antrostomies bilaterally. Orbits appear symmetric bilaterally. Other: Mastoid air cells are clear. CT CERVICAL SPINE FINDINGS Alignment: There is no appreciable spondylolisthesis. Skull base and vertebrae: The skull base and craniocervical junction regions  appear normal. Bones are osteoporotic. No fracture evident. There are no blastic or lytic bone lesions. Soft tissues and spinal canal: Prevertebral soft tissues and predental space regions are within normal limits. No paraspinous lesions. No cord canal hematoma. Disc levels: There is severe disc space narrowing at C5-6, C6-7, and C7-T1. There is multilevel facet osteoarthritic change. There is exit foraminal narrowing due to bony hypertrophy at C2-3 on the left, at C3-4 on the left, at C4-5 on the left, to a lesser extent at C5-6 on the left, and C6-7 on the left. There is marked impression on the exiting nerve root at C3-4 and C4-5 on the left due to bony hypertrophy. No disc extrusion or stenosis. Upper chest: Visualized upper lung zones are clear. Other: There is calcification in each subclavian and carotid artery. IMPRESSION: CT head: Atrophy with supratentorial small vessel disease. Prior small infarct in the head of the caudate nucleus on the right. No mass or hemorrhage. No acute infarct. There are foci of arterial vascular calcification. There are skin staples over the skull vertex. No fracture evident. Extensive paranasal sinus disease at multiple sites on the right side. CT cervical spine: No fracture or appreciable spondylolisthesis. Extensive multifocal arthropathy. Exit foraminal narrowing noted at multiple levels on the left, most severe at C3-4 and C4-5. There is calcification in both subclavian and carotid arteries. Electronically Signed   By: Lowella Grip III M.D.   On: 07/21/2018 07:26   Ct Angio Chest/abd/pel For Dissection W And/or Wo Contrast  Result Date: 07/21/2018 CLINICAL DATA:  GI bleed.  Fall. EXAM: CT ANGIOGRAPHY CHEST, ABDOMEN AND PELVIS TECHNIQUE: Multidetector CT imaging through the chest, abdomen and pelvis was performed using the standard protocol during bolus administration of intravenous contrast. Multiplanar reconstructed images and MIPs were obtained and reviewed to  evaluate the vascular anatomy. CONTRAST:  40mL ISOVUE-370 IOPAMIDOL (ISOVUE-370) INJECTION 76% COMPARISON:  None. FINDINGS: CTA CHEST FINDINGS Cardiovascular: Severe diffuse aortic atherosclerosis. Irregular calcified and noncalcified plaque noted throughout the descending thoracic aorta. Probable penetrating ulcer noted along the posterior wall of the distal descending thoracic aorta. Cardiomegaly. Diffuse coronary artery calcifications. Pacer wires present in the right heart. No evidence of aortic dissection. Mediastinum/Nodes: No mediastinal, hilar, or axillary adenopathy. Lungs/Pleura: Mild emphysema. Biapical scarring. Rounded airspace opacity noted at the right lung base posteriorly could reflect rounded pneumonia or atelectasis. No confluent opacity on the left. Musculoskeletal: No acute bony abnormality. Prior vertebroplasty at T12. Old right rib fractures noted. Review of the MIP images confirms the above findings. CTA ABDOMEN AND PELVIS FINDINGS VASCULAR Aorta: Irregular calcified and noncalcified plaque throughout the aorta. No evidence of aneurysm or dissection. Celiac: Widely patent, calcified. SMA: Widely patent, calcified Renals: Widely patent, calcified. IMA: Patent Inflow: Calcified iliac vessels. No aneurysm or dissection. 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 Veins: Grossly unremarkable. Review of the MIP images confirms the above findings. NON-VASCULAR Hepatobiliary: No focal hepatic abnormality. Gallbladder unremarkable. Pancreas: No focal abnormality or ductal dilatation. Spleen: No focal abnormality.  Normal size. Adrenals/Urinary Tract: Area of decreased enhancement noted posteriorly in the left kidney could reflect area of infarct. No hydronephrosis or suspicious renal mass. Adrenal glands and urinary bladder unremarkable. Stomach/Bowel: Colonic diverticulosis. No active diverticulitis. Stomach and small bowel decompressed, unremarkable. Large stool burden in  the rectosigmoid colon. Lymphatic: No adenopathy Reproductive: Central calcifications within the prostate. Other: No free fluid or free air. Musculoskeletal: Severe compression deformities noted at L2 and L3, likely chronic. Prior vertebroplasty at T12. Diffuse degenerative changes. No acute bony abnormality. Review of the MIP images confirms the above findings. IMPRESSION: Diffuse irregular aortic atherosclerotic plaque. Possible ulcerative plaque in the distal descending thoracic aorta posteriorly. No evidence of aneurysm or dissection. Cardiomegaly, coronary artery disease. Rounded airspace opacity posteriorly in the right lower lobe could reflect rounded pneumonia or atelectasis. Area of decreased enhancement posteriorly in the left kidney suspicious for renal infarct. Sigmoid diverticulosis. Moderate stool burden in the rectosigmoid colon. Chronic appearing compression deformities at L2 and L3. Prior vertebroplasty at T12. Electronically Signed   By: Rolm Baptise M.D.   On: 07/21/2018 07:40     IMPRESSION:   *  Acute LGI hemorrahage, painless hematochezia.   Suspect diverticular bleed Hx adenomatous and hyperplastic colon polyps.  Hyperplastic polyps, diverticulosis, hemorrhoids on most recent colonoscopy 2006.  *    Mild, macrocytic anemia.  *    Ischemic cardiomyopathy, systolic and diastolic dysfunction.  ICD in place.  *    Peripheral vascular disease.  Chronic Plavix, on hold.    PLAN:     *   Colonoscopy?. Nuclear medicine tagged RBC scan if he develops acute recurrent bleeding. Will review the case with Dr. Silverio Decamp and proceed according to her direction.  *   Ok for clear liquids.  CBC q 8 hours ordered.     Azucena Freed  07/21/2018, 10:10 AM Phone [phone]   Attending physician's note   I have taken a history, examined the patient and reviewed the chart. I agree with the Advanced Practitioner's note, impression and  recommendations. 84 year old male with CAD, CHF on  chronic Plavix presented with hematochezia and hemodynamic instability.  Presentation concerning for acute diverticular hemorrhage. Plan for stat Nuclear Med RBC tagged scan and CT angioembolization if nuclear med scan positive. Monitor hemoglobin every 6 hour and transfuse as needed to maintain greater than 7 If RBC tag scan unable to localize the site of bleeding, will consider colonoscopy. Continue clear liquid diet  Raliegh Ip Denzil Magnuson , MD [phone]

## 2018-07-21 NOTE — ED Provider Notes (Signed)
LACERATION REPAIR Performed by: Dewaine Oats Authorized by: Dewaine Oats Consent: Verbal consent obtained. Risks and benefits: risks, benefits and alternatives were discussed Consent given by: patient Patient identity confirmed: provided demographic data Prepped and Draped in normal sterile fashion Wound explored  Laceration Location: scalp  Laceration Length: 6cm  No Foreign Bodies seen or palpated  Anesthesia: local infiltration  Local anesthetic: lidocaine 2% w/epinephrine  Anesthetic total: 3 ml  Irrigation method: syringe Amount of cleaning: standard  Skin closure: staples  Number of sutures: 7  Technique: staples  Patient tolerance: Patient tolerated the procedure well with no immediate complications.   LACERATION REPAIR Performed by: Dewaine Oats Authorized by: Dewaine Oats Consent: Verbal consent obtained. Risks and benefits: risks, benefits and alternatives were discussed Consent given by: patient Patient identity confirmed: provided demographic data Prepped and Draped in normal sterile fashion Wound explored  Laceration Location: forehead  Laceration Length: 5cm  No Foreign Bodies seen or palpated  Anesthesia: local infiltration  Local anesthetic: lidocaine 2% w/epinephrine  Anesthetic total: 3 ml  Irrigation method: syringe Amount of cleaning: standard  Skin closure: 6-0 prolene  Number of sutures: 10  Technique: simple interrupted  Patient tolerance: Patient tolerated the procedure well with no immediate complications.    Charlann Lange, PA-C 07/21/18 [phone]

## 2018-07-21 NOTE — ED Notes (Signed)
Pacemaker interrogated, waiting on reading to be faxed.

## 2018-07-21 NOTE — Progress Notes (Signed)
Patient asked to use BSC at approximately 1645.  Patient had 475 cc bright red blood in bsc, with some small pieces of stool and bloody clots.  Patient was able to get back to bed.  Dr. Karmen Bongo paged and responded right away.  Patient BP was stable, see vital signs and LR bolus given.  Patient continued with stable pressure and Dr. Silverio Decamp was in to see patient. Patient sent to nuclear medicine with tele for scan.  Daughter was called and aware.

## 2018-07-21 NOTE — Progress Notes (Addendum)
Patient had an episode of 475 cc BRBPR.  She is going to check vitals now.  He was able to get back in bed without difficulty.  As per GI note, will order nuclear medicine tagged RBC scan.  Will also order stat CBC now.  Will bolus 250 cc LR now.  Dr. Silverio Decamp has not yet seen the patient and I have asked that she be notified of this recurrent episode.   Carlyon Shadow, M.D.

## 2018-07-21 NOTE — ED Triage Notes (Signed)
[facility] reports the fell at home after experiencing some rectal bleeding. EMS reports the pt hit his head on either the floor or bathroom counter. EMS endorses a positive LOC. EMS reports a RBBB on the cardiac monitor as well as hypotension, 68/40. EMS reports no neck or back pain. EMS does report pt is hard of hearing. EMS advised a 551ml saline bolus was administered. EMS final set of vital signs are 95/42, P-87, CBG 188, R-18, and 97% oxygen saturation on 2 liters.   Pt states he was attempting to use the bathroom when he started bleeding from his bottom, then passed out, and eventually fell. Pt denies any pain.

## 2018-07-21 NOTE — Progress Notes (Signed)
Patient to go to nuclear medicine via bed.  Hemodynamically stable.  250 cc Bolus of LR complete, NS line ready for KVO for blood products.  Spoke with GI  Patient should be able to go to scan on tele without RN due to BP stable.

## 2018-07-22 LAB — BASIC METABOLIC PANEL
Anion gap: 8 (ref 5–15)
BUN: 28 mg/dL — AB (ref 8–23)
CALCIUM: 8 mg/dL — AB (ref 8.9–10.3)
CHLORIDE: 103 mmol/L (ref 98–111)
CO2: 24 mmol/L (ref 22–32)
CREATININE: 1.09 mg/dL (ref 0.61–1.24)
GFR calc Af Amer: >60 mL/min (ref >60)
GFR calc non Af Amer: 60 mL/min — ABNORMAL LOW (ref >60)
Glucose, Bld: 97 mg/dL (ref 70–99)
Potassium: 4.1 mmol/L (ref 3.5–5.1)
Sodium: 135 mmol/L (ref 135–145)

## 2018-07-22 LAB — CBC
HCT: 31.5 % — ABNORMAL LOW (ref 39.0–52.0)
HEMATOCRIT: 30.4 % — AB (ref 39.0–52.0)
HEMOGLOBIN: 10.3 g/dL — AB (ref 13.0–17.0)
Hemoglobin: 10.5 g/dL — ABNORMAL LOW (ref 13.0–17.0)
MCH: 32.4 pg (ref 26.0–34.0)
MCH: 32.2 pg (ref 26.0–34.0)
MCHC: 33.9 g/dL (ref 30.0–36.0)
MCHC: 33.3 g/dL (ref 30.0–36.0)
MCV: 95.6 fL (ref 78.0–100.0)
MCV: 96.6 fL (ref 78.0–100.0)
PLATELETS: 126 10*3/uL — AB (ref 150–400)
Platelets: 141 10*3/uL — ABNORMAL LOW (ref 150–400)
RBC: 3.18 MIL/uL — ABNORMAL LOW (ref 4.22–5.81)
RBC: 3.26 MIL/uL — ABNORMAL LOW (ref 4.22–5.81)
RDW: 15.8 % — AB (ref 11.5–15.5)
RDW: 15.7 % — AB (ref 11.5–15.5)
WBC: 14.3 10*3/uL — ABNORMAL HIGH (ref 4.0–10.5)
WBC: 17.2 10*3/uL — ABNORMAL HIGH (ref 4.0–10.5)

## 2018-07-22 MED ORDER — LACTATED RINGERS IV SOLN
INTRAVENOUS | Status: DC
  Administered 2018-07-22 – 2018-07-23 (×2): via INTRAVENOUS

## 2018-07-22 MED ORDER — AMOXICILLIN-POT CLAVULANATE 875-125 MG PO TABS
1.0000 | ORAL_TABLET | Freq: Two times a day (BID) | ORAL | Status: AC
  Administered 2018-07-22 – 2018-07-23 (×4): 1 via ORAL
  Filled 2018-07-22 (×4): qty 1

## 2018-07-22 MED ORDER — SODIUM CHLORIDE 0.9 % IV SOLN: INTRAVENOUS | Status: DC

## 2018-07-22 NOTE — Progress Notes (Addendum)
Daily Rounding Note  07/22/2018, 11:34 AM  LOS: 1 day   SUBJECTIVE:   Chief complaint: hematochezia.    Last episode of passing blood ~ 1645 in ED, went to nuclear RBC scan started at 1845.   Feels ok.  No abd pain.  Is NPO.    OBJECTIVE:         Vital signs in last 24 hours:    Temp:  [98.1 F (36.7 C)-99.3 F (37.4 C)] 98.4 F (36.9 C) (10/01 0800) Pulse Rate:  [59-61] 61 (10/01 0800) Resp:  [16-20] 18 (10/01 0800) BP: (115-143)/(56-64) 115/64 (10/01 0800) SpO2:  [92 %-100 %] 96 % (10/01 0800) Last BM Date: 07/21/18 Filed Weights   07/21/18 0511  Weight: 80.7 kg   General: scabbed over head lacs on left.  Frail looking.   Heart: RRR Chest: clear bil. + loose cough.  No SOB Abdomen: soft, NT, ND.  Active BS  Extremities: no CCE Neuro/Psych:  Alert, oriented x 3.  Slow mentation and speech.  Moves all 4 limbs.    Intake/Output from previous day: 09/30 0701 - 10/01 0700 In: 1962.7 [P.O.:480; I.V.:1220.2; IV Piggyback:262.6] Out: 600 [Urine:600]  Intake/Output this shift: No intake/output data recorded.  Lab Results: Recent Labs    07/21/18 1758 07/21/18 2125 07/22/18 0328  WBC 18.2* 16.2* 14.3*  HGB 11.2* 10.6* 10.3*  HCT 31.5* 31.5* 30.4*  PLT 118* 128* 141*   BMET Recent Labs    07/21/18 0531 07/21/18 0542 07/22/18 0328  NA 138 140 135  K 4.4 4.4 4.1  CL 104 102 103  CO2 25  --  24  GLUCOSE 138* 132* 97  BUN 28* 30* 28*  CREATININE 1.36* 1.40* 1.09  CALCIUM 8.3*  --  8.0*   LFT Recent Labs    07/21/18 0531  PROT 6.0*  ALBUMIN 3.0*  AST 39  ALT 38  ALKPHOS 68  BILITOT 0.7   PT/INR Recent Labs    07/21/18 0531  LABPROT 15.0  INR 1.19    Studies/Results: Ct Head Wo Contrast Ct Cervical Spine Wo Contrast  Result Date: 07/21/2018 CLINICAL DATA:  Pain following fall EXAM: CT HEAD WITHOUT CONTRAST CT CERVICAL SPINE WITHOUT CONTRAST TECHNIQUE: Multidetector CT imaging of the  head and cervical spine was performed following the standard protocol without intravenous contrast. Multiplanar CT image reconstructions of the cervical spine were also generated. CT HEAD FINDINGS Brain: There is moderate diffuse atrophy. There is no intracranial mass, hemorrhage, extra-axial fluid collection, or midline shift. There is decreased attenuation throughout much of the centra semiovale bilaterally, felt to represent supratentorial small vessel disease. There is evidence of a prior small infarct in the head of the caudate nucleus on the right. No acute appearing infarct is evident. Vascular: No hyperdense vessels are evident. There is calcification in each distal vertebral artery as well as in the basilar artery and both carotid siphon regions. Skull: There are skin staples overlying the superior most aspect of the scalp posteriorly. There is mild hematoma in this area. The bony calvarium appears intact. Minimal soft tissue air is noted in the scalp over the left frontal bone. Sinuses/Orbits: There is opacification of the right maxillary antrum as well as multiple right-sided ethmoid air cells. There is also opacification in the right frontal sinus region. There is evidence of previous antrostomies bilaterally. Orbits appear symmetric bilaterally. Other: Mastoid air cells are clear. CT CERVICAL SPINE FINDINGS Alignment: There is no appreciable spondylolisthesis. Skull  base and vertebrae: The skull base and craniocervical junction regions appear normal. Bones are osteoporotic. No fracture evident. There are no blastic or lytic bone lesions. Soft tissues and spinal canal: Prevertebral soft tissues and predental space regions are within normal limits. No paraspinous lesions. No cord canal hematoma. Disc levels: There is severe disc space narrowing at C5-6, C6-7, and C7-T1. There is multilevel facet osteoarthritic change. There is exit foraminal narrowing due to bony hypertrophy at C2-3 on the left, at C3-4  on the left, at C4-5 on the left, to a lesser extent at C5-6 on the left, and C6-7 on the left. There is marked impression on the exiting nerve root at C3-4 and C4-5 on the left due to bony hypertrophy. No disc extrusion or stenosis. Upper chest: Visualized upper lung zones are clear. Other: There is calcification in each subclavian and carotid artery. IMPRESSION: CT head: Atrophy with supratentorial small vessel disease. Prior small infarct in the head of the caudate nucleus on the right. No mass or hemorrhage. No acute infarct. There are foci of arterial vascular calcification. There are skin staples over the skull vertex. No fracture evident. Extensive paranasal sinus disease at multiple sites on the right side. CT cervical spine: No fracture or appreciable spondylolisthesis. Extensive multifocal arthropathy. Exit foraminal narrowing noted at multiple levels on the left, most severe at C3-4 and C4-5. There is calcification in both subclavian and carotid arteries. Electronically Signed   By: Lowella Grip III M.D.   On: 07/21/2018 07:26   Nm Gi Blood Loss  Result Date: 07/21/2018 CLINICAL DATA:  GI bleeding. Syncopal spell in the bathroom. Bright red blood per rectum. EXAM: NUCLEAR MEDICINE GASTROINTESTINAL BLEEDING SCAN TECHNIQUE: Sequential abdominal images were obtained following intravenous administration of Tc-67m labeled red blood cells. RADIOPHARMACEUTICALS:  27.3 mCi Tc-71m pertechnetate in-vitro labeled red cells. COMPARISON:  CT of the chest on 07/21/2018 FINDINGS: Expected blood pool activity identified. The study is carried out to 120 minutes. There is no source of bleeding detected during the exam. IMPRESSION: Study is negative for the site of GI bleeding. Electronically Signed   By: Nolon Nations M.D.   On: 07/21/2018 21:23   Ct Angio Chest/abd/pel For Dissection W And/or Wo Contrast  Result Date: 07/21/2018 CLINICAL DATA:  GI bleed.  Fall. EXAM: CT ANGIOGRAPHY CHEST, ABDOMEN AND  PELVIS TECHNIQUE: Multidetector CT imaging through the chest, abdomen and pelvis was performed using the standard protocol during bolus administration of intravenous contrast. Multiplanar reconstructed images and MIPs were obtained and reviewed to evaluate the vascular anatomy. CONTRAST:  30mL ISOVUE-370 IOPAMIDOL (ISOVUE-370) INJECTION 76% COMPARISON:  None. FINDINGS: CTA CHEST FINDINGS Cardiovascular: Severe diffuse aortic atherosclerosis. Irregular calcified and noncalcified plaque noted throughout the descending thoracic aorta. Probable penetrating ulcer noted along the posterior wall of the distal descending thoracic aorta. Cardiomegaly. Diffuse coronary artery calcifications. Pacer wires present in the right heart. No evidence of aortic dissection. Mediastinum/Nodes: No mediastinal, hilar, or axillary adenopathy. Lungs/Pleura: Mild emphysema. Biapical scarring. Rounded airspace opacity noted at the right lung base posteriorly could reflect rounded pneumonia or atelectasis. No confluent opacity on the left. Musculoskeletal: No acute bony abnormality. Prior vertebroplasty at T12. Old right rib fractures noted. Review of the MIP images confirms the above findings. CTA ABDOMEN AND PELVIS FINDINGS VASCULAR Aorta: Irregular calcified and noncalcified plaque throughout the aorta. No evidence of aneurysm or dissection. Celiac: Widely patent, calcified. SMA: Widely patent, calcified Renals: Widely patent, calcified. IMA: Patent Inflow: Calcified iliac vessels. No aneurysm or dissection. 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 Veins: Grossly unremarkable. Review of the MIP images confirms the above findings. NON-VASCULAR Hepatobiliary: No focal hepatic abnormality. Gallbladder unremarkable. Pancreas: No focal abnormality or ductal dilatation. Spleen: No focal abnormality.  Normal size. Adrenals/Urinary Tract: Area of decreased enhancement noted posteriorly in the left kidney could reflect  area of infarct. No hydronephrosis or suspicious renal mass. Adrenal glands and urinary bladder unremarkable. Stomach/Bowel: Colonic diverticulosis. No active diverticulitis. Stomach and small bowel decompressed, unremarkable. Large stool burden in the rectosigmoid colon. Lymphatic: No adenopathy Reproductive: Central calcifications within the prostate. Other: No free fluid or free air. Musculoskeletal: Severe compression deformities noted at L2 and L3, likely chronic. Prior vertebroplasty at T12. Diffuse degenerative changes. No acute bony abnormality. Review of the MIP images confirms the above findings. IMPRESSION: Diffuse irregular aortic atherosclerotic plaque. Possible ulcerative plaque in the distal descending thoracic aorta posteriorly. No evidence of aneurysm or dissection. Cardiomegaly, coronary artery disease. Rounded airspace opacity posteriorly in the right lower lobe could reflect rounded pneumonia or atelectasis. Area of decreased enhancement posteriorly in the left kidney suspicious for renal infarct. Sigmoid diverticulosis. Moderate stool burden in the rectosigmoid colon. Chronic appearing compression deformities at L2 and L3. Prior vertebroplasty at T12. Electronically Signed   By: Rolm Baptise M.D.   On: 07/21/2018 07:40    Scheduled Meds: . sodium chloride   Intravenous Once  . allopurinol  100 mg Oral Daily  . amiodarone  200 mg Oral Daily  . amoxicillin-clavulanate  1 tablet Oral Q12H  . guaiFENesin  1,200 mg Oral q morning - 10a   And  . guaiFENesin  600 mg Oral QHS  . levothyroxine  50 mcg Oral QAC breakfast  . magnesium oxide  200 mg Oral Daily  . midodrine  2.5 mg Oral TID WC  . mometasone-formoterol  2 puff Inhalation BID  . multivitamin with minerals  1 tablet Oral Daily  . oxybutynin  5 mg Oral BID  . pravastatin  40 mg Oral Daily  . sertraline  50 mg Oral QHS  . thiamine  100 mg Oral Daily   Continuous Infusions: . lactated ringers 75 mL/hr at 07/22/18 1131   PRN  Meds:.acetaminophen **OR** acetaminophen, albuterol, ondansetron **OR** ondansetron (ZOFRAN) IV, traMADol   ASSESMENT:   *   Painless hematochezia.  Suspect diverticular bleed (tics jon previous colonoscopies and yesterdays CT) NM blood loss study negative.    *    Macrocytic anemia.  Has not received PRBCs thus far. Hgb dropped but not precipitously.     *   ASPVD.   Chronic Plavix on hold.  Last dose 9/29    *   Head trauma, lacerations occurring after syncope/fall at home.  No evidence of brain trauma on CT head.     PLAN   *   Full liquid diet.   Consider Colonoscopy, will d/w Dr Silverio Decamp.    *   BID CBC.      Azucena Freed  07/22/2018, 11:34 AM Phone [phone]   Attending physician's note   I have taken an interval history, reviewed the chart and examined the patient. I agree with the Advanced Practitioner's note, impression and recommendations.  No further bleeding since yesterday.  CT angios and nuclear med RBC scan negative Differential includes diverticular hemorrhage versus ischemic colitis Hemoglobin remains stable If no further bleeding, will plan to hold off colonoscopy given his advanced age Please discuss with PMD and cardiology if he needs  to continue Plavix long-term given the risk for recurrent GI bleed  K. Denzil Magnuson , MD [phone]

## 2018-07-22 NOTE — Progress Notes (Signed)
[facility] Progress Note  Patient: [name] SEG:[id number]   PCP: Noralee Space, MD DOB: 1931/01/27   DOA: 07/21/2018   DOS: 07/22/2018   Date of Service: the patient was seen and examined on 07/22/2018  Brief hospital course: Pt. with PMH of active smoker, and ICM S/P AICD placement, PVD, HTN, HLD, COPD, chronic combined CHF, CAD, AAA repair; admitted on 07/21/2018, presented with complaint of passing out episode, was found to have symptomatic anemia, BRBPR likely diverticular bleeding as well as scalp laceration. Currently further plan is monitor H&H every 8 hours transfuse for hemoglobin less than 7 or hemodynamic instability and consider colonoscopy.  Subjective: Had an episode of bleeding yesterday in the hospital as well.  No further blood in the stool.  No abdominal pain.  No nausea no vomiting.  No fever no chills.  Breathing is okay.  Denies any dizziness.  Assessment and Plan: 1.  BRBPR. Likely lower GI bleeding probably diverticular. Painless. H&H relatively stable likely dilutional. GI consulted. Conservative management for now. Possible colonoscopy. GI recommending full liquid diet for now. Plavix is on hold. Nuclear medicine bleeding scan is negative for any area of active bleeding.  2.  Symptomatic anemia. Syncope. Scalp laceration. Presented with a passing out episode. Hemoglobin and blood pressure currently stable. Scalp laceration not bleeding. CT head unremarkable CT C-spine unremarkable. Continue to monitor on telemetry for now. Transfuse 1 PRBC. Monitor H&H. Follow-up on GI recommendation.  3.  Essential hypertension. Orthostatic hypotension Chronic combined CHF. NICM S/P ICD placement History of ventricular tachycardia  Last echo shows 30 to 35% EF with grade 2 diastolic dysfunction. On Lasix at home. Received IV fluids. Holding Coreg.  Continue midodrine. Continue amiodarone for V. tach's in the past.  4.  Recent pneumonia. Patient was  seen by Dr. Murvin Natal outpatient and started on oral Augmentin 07/16/2018. He is to finish 2 more days of oral antibiotics.  Ordered in the hospital. Do not think that the patient actually has an new pneumonia will finish the course.  5.  Incidental findings of the CT scan. Prior vertebroplasty. Questionable left renal infarct. Monitor for them.  6.  Hypothyroidism. Continue Synthroid 50 MCG.  7.  Mood disorder. Continue Zoloft.  Diet: full liquid diet, adjust per gastroenterology  DVT Prophylaxis: mechanical compression device  Advance goals of care discussion: DNR DNI  Family Communication: family was present at bedside, at the time of interview. The pt provided permission to discuss medical plan with the family. Opportunity was given to ask question and all questions were answered satisfactorily.   Disposition:  Discharge to be determined.  Consultants: gastroenterology  Procedures: NM bleeding scan  Scheduled Meds: . sodium chloride   Intravenous Once  . allopurinol  100 mg Oral Daily  . amiodarone  200 mg Oral Daily  . amoxicillin-clavulanate  1 tablet Oral Q12H  . guaiFENesin  1,200 mg Oral q morning - 10a   And  . guaiFENesin  600 mg Oral QHS  . levothyroxine  50 mcg Oral QAC breakfast  . magnesium oxide  200 mg Oral Daily  . midodrine  2.5 mg Oral TID WC  . mometasone-formoterol  2 puff Inhalation BID  . multivitamin with minerals  1 tablet Oral Daily  . oxybutynin  5 mg Oral BID  . pravastatin  40 mg Oral Daily  . sertraline  50 mg Oral QHS  . thiamine  100 mg Oral Daily   Continuous Infusions: . lactated ringers 75 mL/hr at 07/22/18  1131   PRN Meds: acetaminophen **OR** acetaminophen, albuterol, ondansetron **OR** ondansetron (ZOFRAN) IV, traMADol Antibiotics: Anti-infectives (From admission, onward)   Start     Dose/Rate Route Frequency Ordered Stop   07/22/18 1000  amoxicillin-clavulanate (AUGMENTIN) 875-125 MG per tablet 1 tablet     1 tablet Oral Every  12 hours 07/22/18 0959 07/24/18 0959       Objective: Physical Exam: Vitals:   07/21/18 1748 07/22/18 0012 07/22/18 0711 07/22/18 0800  BP: (!) 143/64 (!) 115/56  115/64  Pulse:  60  61  Resp:  20  18  Temp:  98.6 F (37 C)  98.4 F (36.9 C)  TempSrc:  Oral  Oral  SpO2:  98% 92% 96%  Weight:      Height:        Intake/Output Summary (Last 24 hours) at 07/22/2018 1456 Last data filed at 07/22/2018 1342 Gross per 24 hour  Intake 1611.74 ml  Output 850 ml  Net 761.74 ml   Filed Weights   07/21/18 0511  Weight: 80.7 kg   General: Alert, Awake and Oriented to Time, Place and Person. Appear in mild distress, affect appropriate Eyes: PERRL, Conjunctiva normal ENT: Oral Mucosa clear moist. Neck: no JVD, no Abnormal Mass Or lumps Cardiovascular: S1 and S2 Present, no Murmur, Peripheral Pulses Present Respiratory: normal respiratory effort, Bilateral Air entry equal and Decreased, no use of accessory muscle, Clear to Auscultation, no Crackles, no wheezes Abdomen: Bowel Sound present, Soft and no tenderness, no hernia Skin: no redness, no Rash, no induration Extremities: no Pedal edema, no calf tenderness Neurologic: Grossly no focal neuro deficit. Bilaterally Equal motor strength  Data Reviewed: CBC: Recent Labs  Lab 07/21/18 0531 07/21/18 0542 07/21/18 1154 07/21/18 1758 07/21/18 2125 07/22/18 0328  WBC 16.7*  --  16.4* 18.2* 16.2* 14.3*  NEUTROABS 12.8*  --   --   --   --   --   HGB 11.6* 11.9* 11.2* 11.2* 10.6* 10.3*  HCT 35.9* 35.0* 34.0* 31.5* 31.5* 30.4*  MCV 101.4*  --  97.7 91.8 95.2 95.6  PLT 172  --  126* 118* 128* 564*   Basic Metabolic Panel: Recent Labs  Lab 07/21/18 0531 07/21/18 0542 07/22/18 0328  NA 138 140 135  K 4.4 4.4 4.1  CL 104 102 103  CO2 25  --  24  GLUCOSE 138* 132* 97  BUN 28* 30* 28*  CREATININE 1.36* 1.40* 1.09  CALCIUM 8.3*  --  8.0*    Liver Function Tests: Recent Labs  Lab 07/21/18 0531  AST 39  ALT 38  ALKPHOS 68    BILITOT 0.7  PROT 6.0*  ALBUMIN 3.0*   No results for input(s): LIPASE, AMYLASE in the last 168 hours. No results for input(s): AMMONIA in the last 168 hours. Coagulation Profile: Recent Labs  Lab 07/21/18 0531  INR 1.19   Cardiac Enzymes: No results for input(s): CKTOTAL, CKMB, CKMBINDEX, TROPONINI in the last 168 hours. BNP (last 3 results) Recent Labs    12/16/17 1221  PROBNP 673.0*   CBG: No results for input(s): GLUCAP in the last 168 hours. Studies: Nm Gi Blood Loss  Result Date: 07/21/2018 CLINICAL DATA:  GI bleeding. Syncopal spell in the bathroom. Bright red blood per rectum. EXAM: NUCLEAR MEDICINE GASTROINTESTINAL BLEEDING SCAN TECHNIQUE: Sequential abdominal images were obtained following intravenous administration of Tc-88m labeled red blood cells. RADIOPHARMACEUTICALS:  27.3 mCi Tc-20m pertechnetate in-vitro labeled red cells. COMPARISON:  CT of the chest on 07/21/2018 FINDINGS: Expected  blood pool activity identified. The study is carried out to 120 minutes. There is no source of bleeding detected during the exam. IMPRESSION: Study is negative for the site of GI bleeding. Electronically Signed   By: Nolon Nations M.D.   On: 07/21/2018 21:23     Time spent: 35 minutes  Author: Berle Mull, MD [facility] Pager: [phone] 07/22/2018 2:56 PM  If 7PM-7AM, please contact night-coverage at [email or web address], password [facility]

## 2018-07-23 DIAGNOSIS — I959 Hypotension, unspecified: Secondary | ICD-10-CM

## 2018-07-23 DIAGNOSIS — N183 Chronic kidney disease, stage 3 (moderate): Secondary | ICD-10-CM

## 2018-07-23 DIAGNOSIS — I10 Essential (primary) hypertension: Secondary | ICD-10-CM

## 2018-07-23 DIAGNOSIS — I5042 Chronic combined systolic (congestive) and diastolic (congestive) heart failure: Secondary | ICD-10-CM

## 2018-07-23 DIAGNOSIS — K625 Hemorrhage of anus and rectum: Secondary | ICD-10-CM

## 2018-07-23 DIAGNOSIS — R55 Syncope and collapse: Secondary | ICD-10-CM

## 2018-07-23 LAB — CBC
HEMATOCRIT: 28.9 % — AB (ref 39.0–52.0)
HEMOGLOBIN: 9.7 g/dL — AB (ref 13.0–17.0)
MCH: 32.3 pg (ref 26.0–34.0)
MCHC: 33.6 g/dL (ref 30.0–36.0)
MCV: 96.3 fL (ref 78.0–100.0)
PLATELETS: 132 10*3/uL — AB (ref 150–400)
RBC: 3 MIL/uL — ABNORMAL LOW (ref 4.22–5.81)
RDW: 15.3 % (ref 11.5–15.5)
WBC: 13.5 10*3/uL — AB (ref 4.0–10.5)

## 2018-07-23 LAB — CBC WITH DIFFERENTIAL/PLATELET
ABS IMMATURE GRANULOCYTES: 0.2 10*3/uL — AB (ref 0.0–0.1)
BASOS ABS: 0 10*3/uL (ref 0.0–0.1)
Basophils Relative: 0 %
Eosinophils Absolute: 0.4 10*3/uL (ref 0.0–0.7)
Eosinophils Relative: 2 %
HCT: 29.5 % — ABNORMAL LOW (ref 39.0–52.0)
HEMOGLOBIN: 9.9 g/dL — AB (ref 13.0–17.0)
IMMATURE GRANULOCYTES: 1 %
LYMPHS ABS: 1.3 10*3/uL (ref 0.7–4.0)
LYMPHS PCT: 9 %
MCH: 32.2 pg (ref 26.0–34.0)
MCHC: 33.6 g/dL (ref 30.0–36.0)
MCV: 96.1 fL (ref 78.0–100.0)
Monocytes Absolute: 1 10*3/uL (ref 0.1–1.0)
Monocytes Relative: 7 %
NEUTROS ABS: 12.4 10*3/uL — AB (ref 1.7–7.7)
NEUTROS PCT: 81 %
Platelets: 115 10*3/uL — ABNORMAL LOW (ref 150–400)
RBC: 3.07 MIL/uL — AB (ref 4.22–5.81)
RDW: 15.4 % (ref 11.5–15.5)
WBC: 15.3 10*3/uL — AB (ref 4.0–10.5)

## 2018-07-23 LAB — MAGNESIUM: Magnesium: 2 mg/dL (ref 1.7–2.4)

## 2018-07-23 LAB — COMPREHENSIVE METABOLIC PANEL
ALBUMIN: 2.5 g/dL — AB (ref 3.5–5.0)
ALK PHOS: 60 U/L (ref 38–126)
ALT: 28 U/L (ref 0–44)
ANION GAP: 4 — AB (ref 5–15)
AST: 35 U/L (ref 15–41)
BUN: 20 mg/dL (ref 8–23)
CALCIUM: 7.7 mg/dL — AB (ref 8.9–10.3)
CO2: 24 mmol/L (ref 22–32)
Chloride: 106 mmol/L (ref 98–111)
Creatinine, Ser: 1.1 mg/dL (ref 0.61–1.24)
GFR calc Af Amer: >60 mL/min (ref >60)
GFR calc non Af Amer: 59 mL/min — ABNORMAL LOW (ref >60)
GLUCOSE: 98 mg/dL (ref 70–99)
Potassium: 4.1 mmol/L (ref 3.5–5.1)
SODIUM: 134 mmol/L — AB (ref 135–145)
Total Bilirubin: 1 mg/dL (ref 0.3–1.2)
Total Protein: 5.3 g/dL — ABNORMAL LOW (ref 6.5–8.1)

## 2018-07-23 LAB — PROTIME-INR
INR: 1.17
Prothrombin Time: 14.8 seconds (ref 11.4–15.2)

## 2018-07-23 NOTE — Evaluation (Signed)
Clinical/Bedside Swallow Evaluation Patient Details  Name: [name] MRN: [id number] Date of Birth: 1931/01/19  Today's Date: 07/23/2018 Time: SLP Start Time (ACUTE ONLY): 1526 SLP Stop Time (ACUTE ONLY): 6644 SLP Time Calculation (min) (ACUTE ONLY): 18 min  Past Medical History:  Past Medical History:  Diagnosis Date  . AAA (abdominal aortic aneurysm) ([place])    a. 2002 s/p repair.  . Allergic rhinitis   . Back pain   . CAD (coronary artery disease)    a. 02/2002 H/o MI with stenting x 2; b. 04/2013 MV: EF 25%, large posterior lateral infarct w/o ischemia; c. 03/2016 VT Arrest/Cath: LM 60ost, LAD 40p/m ISR, LCX 100p/m, RCA nl-->Med Rx.  . Carotid arterial disease ([place])    a. 12/2010 s/p R CEA;  b. 05/2015 Carotid U/S: bilat <40% ICA stenosis.  . Chronic combined systolic and diastolic CHF (congestive heart failure) ([place])    a. 03/2016 Echo: Ef 35-40%, grade 1 DD.  Marland Kitchen Compression fracture   . COPD (chronic obstructive pulmonary disease) ([place])   . Dermatitis   . Diverticulosis of colon   . DJD (degenerative joint disease)    and Gout  . Hemorrhoids   . History of colonic polyps   . History of gout   . History of pneumonia   . Hypercholesterolemia   . Hypertension   . Hypertensive heart disease   . Ischemic cardiomyopathy    a. EF prev <35%-->improved to normal by Echo 8/14:  Mild LVH, focal basal hypertrophy, EF 60-65%, normal wall motion, mild BAE, PASP 36; c. 03/2016 Echo: EF 35-40%, Gr1 DD, triv AI, mild MR, mod dil LA, mild-mod TR, PASP 67mmHg.  . Osteoporosis   . Peripheral vascular disease ([place])   . Presence of cardiac defibrillator    a. 03/2008 s/p MDT [id number] Maximo II DR, DC AICD; b. 03/2013: ICD shock for T wave oversensing;  c. 10/2015: collective decision not to replace ICD given improvement in LV fxn; d. VT Arrest-->Gen change to MDT ser # [id number].  . Skin cancer    shoulders and forehead  . Syncope   . T wave over sensing resulting in inappropriate shocks    a.  03/2013.  . Tobacco abuse   . Ventricular tachycardia ([place]) 04/01/2016   Past Surgical History:  Past Surgical History:  Procedure Laterality Date  . ABDOMINAL AORTIC ANEURYSM REPAIR  2002   by Dr. Kellie Simmering  . aicd placed  03/2008   Dr. Caryl Comes  . cad stent  02/2002   Dr. Percival Spanish  . CARDIAC CATHETERIZATION     X 2 stents  . CARDIAC CATHETERIZATION N/A 04/03/2016   Procedure: Left Heart Cath and Coronary Angiography;  Surgeon: Belva Crome, MD;  Location: [place];  Service: Cardiovascular;  Laterality: N/A;  . CARDIAC DEFIBRILLATOR PLACEMENT  2009  . CARDIAC DEFIBRILLATOR PLACEMENT    . CAROTID ENDARTERECTOMY Right January 02, 2011  . EAR CYST EXCISION Left 12/13/2015   Procedure: Excision left ear lesion ;  Surgeon: Melissa Montane, MD;  Location: [place];  Service: ENT;  Laterality: Left;  . EP IMPLANTABLE DEVICE N/A 04/06/2016   Procedure:  ICD Generator Changeout;  Surgeon: Deboraha Sprang, MD;  Location: [facility] [facility];  Service: Cardiovascular;  Laterality: N/A;  . EXCISION OF LESION LEFT EAR Left 12/13/2015  . I&D EXTREMITY Left 04/23/2016   Procedure: IRRIGATION AND DEBRIDEMENT HAND;  Surgeon: Leandrew Koyanagi, MD;  Location: [place];  Service: Orthopedics;  Laterality: Left;  .  IR IMAGE GUIDED DRAINAGE BY PERCUTANEOUS CATHETER  10/08/2017  . OPEN REDUCTION INTERNAL FIXATION (ORIF) METACARPAL Left 04/04/2016   Procedure: OPEN REDUCTION INTERNAL FIXATION (ORIF) LEFT 2ND, 3RD, 4TH METACARPAL FRACTURE;  Surgeon: Leandrew Koyanagi, MD;  Location: [place];  Service: Orthopedics;  Laterality: Left;  OPEN REDUCTION INTERNAL FIXATION (ORIF) LEFT 2ND, 3RD, 4TH METACARPAL FRACTURE  . OPEN REDUCTION INTERNAL FIXATION (ORIF) METACARPAL Left 04/23/2016   Procedure: REVISION OPEN REDUCTION INTERNAL FIXATION (ORIF) 2ND METACARPAL;  Surgeon: Leandrew Koyanagi, MD;  Location: [place];  Service: Orthopedics;  Laterality: Left;  . right corotid enderectomy  12/2010   Dr. Scot Dock  . SKIN SPLIT GRAFT Left 12/13/2015    Procedure: with possible skin graft;  Surgeon: Melissa Montane, MD;  Location: [place];  Service: ENT;  Laterality: Left;  . TONSILLECTOMY     HPI:  Pt is an 84 y/o male admitted secondary to syncopal episode, thought to be secondary to symptomatic anemia from GI bleed. Pt with scalp laceration which was repaired. CT of head and neck negative for acute abnormality. PMH includes smoker, s/p ICD, PVD, HTN, COPD, CHF, CAD, and AAA repair. Pt also has a h/o dysphagia with MBS 10/02/17 showing gross residuals, mild aspiration of thin liquids that couldn't be cleared (weak cough in response), and poor awareness/insight into deficits. He was started on full liquid diet, thickened to nectar thick, using a chin tuck and straw; tsps of water also okay. He was advanced to Dys 3 diet based on [place] decisions. SLP had recommended repeat MBS prior to d/c but pt declined, stating that he would drink thin liquids upon return home regardless of results.   Assessment / Plan / Recommendation Clinical Impression  Pt's swallow function is likely similar to function documented during admission last year, at which time pt had a significant dysphagia with poor awareness. He continues to cough regardless of consistencies presented. We discussed concern for aspiration, which could be the cause of his repeated PNAs. SLP offered repeat testing and possible dysphagia interventrions, but pt declined, stating, "I'm going to eat and drink whatever I want anyway." He did not want to participate in testing and denied any trouble swallowing. At this time, will defer diet recmmendations to MD. Pt appears to want to accept the risk of aspiration, although with limited insight into consequences. SLP to sign off acutely - please reorder if pt is interested in our services. SLP Visit Diagnosis: Dysphagia, unspecified (R13.10)    Aspiration Risk  Moderate aspiration risk    Diet Recommendation (per MD)        Other  Recommendations Oral Care  Recommendations: Oral care QID   Follow up Recommendations None      Frequency and Duration            Prognosis Prognosis for Safe Diet Advancement: Guarded Barriers to Reach Goals: Time post onset;Severity of deficits      Swallow Study   General HPI: Pt is an 84 y/o male admitted secondary to syncopal episode, thought to be secondary to symptomatic anemia from GI bleed. Pt with scalp laceration which was repaired. CT of head and neck negative for acute abnormality. PMH includes smoker, s/p ICD, PVD, HTN, COPD, CHF, CAD, and AAA repair. Pt also has a h/o dysphagia with MBS 10/02/17 showing gross residuals, mild aspiration of thin liquids that couldn't be cleared (weak cough in response), and poor awareness/insight into deficits. He was started on full liquid diet, thickened to nectar thick, using a  chin tuck and straw; tsps of water also okay. He was advanced to Dys 3 diet based on [place] decisions. SLP had recommended repeat MBS prior to d/c but pt declined, stating that he would drink thin liquids upon return home regardless of results. Type of Study: Bedside Swallow Evaluation Previous Swallow Assessment: see HPI Diet Prior to this Study: Regular;Thin liquids Temperature Spikes Noted: No Respiratory Status: Room air History of Recent Intubation: No Behavior/Cognition: Alert;Cooperative Oral Cavity Assessment: Within Functional Limits Oral Care Completed by SLP: No Oral Cavity - Dentition: Adequate natural dentition Vision: Functional for self-feeding Self-Feeding Abilities: Able to feed self Patient Positioning: Upright in bed Baseline Vocal Quality: Normal Volitional Cough: Strong Volitional Swallow: Able to elicit    Oral/Motor/Sensory Function Overall Oral Motor/Sensory Function: Within functional limits   Ice Chips Ice chips: Not tested   Thin Liquid Thin Liquid: Impaired Presentation: Cup;Self Fed;Straw Pharyngeal  Phase Impairments: Cough - Immediate    Nectar Thick  Nectar Thick Liquid: Not tested   Honey Thick Honey Thick Liquid: Not tested   Puree Puree: Impaired Presentation: Self Fed;Spoon Pharyngeal Phase Impairments: Cough - Delayed   Solid     Solid: Impaired Presentation: Self Fed Pharyngeal Phase Impairments: Cough - Delayed      Germain Osgood 07/23/2018,4:09 PM   Germain Osgood, M.A. [place] Acute Environmental education officer [phone] Office [phone]

## 2018-07-23 NOTE — Progress Notes (Signed)
PROGRESS NOTE  [name] HER:[id number] DOB: [birth year] DOA: 07/21/2018 PCP: Noralee Space, MD   LOS: 2 days   Brief Narrative / Interim history: 84 year old male with history of ischemic cardiomyopathy with chronic combined CHF with AICD in place, PVD, hypertension, hyperlipidemia, COPD who presented to the hospital with a syncopal episode in the setting of bright red blood per rectum, also having a significant scalp laceration.  Subjective: -Has not had bowel movements or bleeding in the past 36 hours.  No chest pain, shortness of breath, feels well  Assessment & Plan: Principal Problem:   Lower GI bleeding Active Problems:   Essential hypertension   SYNCOPE   Alcohol use   Orthostatic hypotension   Chronic combined systolic and diastolic CHF (congestive heart failure) ([facility])   Forehead laceration, initial encounter   CKD (chronic kidney disease) stage 3, GFR 30-59 ml/min ([facility])    Lower GI bleed -Likely diverticular, gastroenterology consulted and are following.  Hemoglobin has remained stable and his bleeding clinically appears to have stop and for now recommending conservative management -Discussed with gastroenterology Dr. Silverio Decamp, will monitor and consider discharge tomorrow if remains stable  Fall/syncope -Possible transient hypotension in the setting of #1, laceration sutured in the ER, CT head and C-spine are unremarkable -Obtain physical therapy consult today  Hypertension /history of orthostatic hypotension -He is both on Coreg as well as midodrine at home, hold Coreg for now  Chronic combined systolic and diastolic CHF -Currently appears euvolemic, discontinue IV fluids, hold furosemide  History of V. tach with ICD in place -Continue amiodarone  Recent community-acquired pneumonia -Starting oral Augmentin on 9/25, finish course while hospitalized  Hypothyroidism -Continue Synthroid    Scheduled Meds: . sodium chloride   Intravenous Once  .  allopurinol  100 mg Oral Daily  . amiodarone  200 mg Oral Daily  . amoxicillin-clavulanate  1 tablet Oral Q12H  . guaiFENesin  1,200 mg Oral q morning - 10a   And  . guaiFENesin  600 mg Oral QHS  . levothyroxine  50 mcg Oral QAC breakfast  . magnesium oxide  200 mg Oral Daily  . midodrine  2.5 mg Oral TID WC  . mometasone-formoterol  2 puff Inhalation BID  . multivitamin with minerals  1 tablet Oral Daily  . oxybutynin  5 mg Oral BID  . pravastatin  40 mg Oral Daily  . sertraline  50 mg Oral QHS  . thiamine  100 mg Oral Daily   Continuous Infusions: PRN Meds:.acetaminophen **OR** acetaminophen, albuterol, ondansetron **OR** ondansetron (ZOFRAN) IV, traMADol  DVT prophylaxis: SCDs Code Status: DNR Family Communication: no family at bedside Disposition Plan: home in 1 day if stable   Consultants:   Gastroenterology   Procedures:   None   Antimicrobials:  Augmentin    Objective: Vitals:   07/22/18 1600 07/22/18 2303 07/23/18 0724 07/23/18 0748  BP: (!) 132/58 119/62  118/71  Pulse: 64 63  (!) 59  Resp:  15    Temp: 98.2 F (36.8 C) 98.9 F (37.2 C)  98.3 F (36.8 C)  TempSrc: Oral Oral  Oral  SpO2: 98% 97% 94% 96%  Weight:      Height:        Intake/Output Summary (Last 24 hours) at 07/23/2018 1207 Last data filed at 07/23/2018 0656 Gross per 24 hour  Intake 1554.46 ml  Output 1650 ml  Net -95.54 ml   Filed Weights   07/21/18 0511  Weight: 80.7 kg    Examination:  Constitutional: NAD Eyes: PERRL, lids and conjunctivae normal ENMT: Mucous membranes are moist.  2 separate scalp lacerations, sutured Neck: normal, supple, no masses, no thyromegaly Respiratory: clear to auscultation bilaterally, no wheezing, no crackles.  Cardiovascular: Regular rate and rhythm, no murmurs / rubs / gallops. Trace LE edema.  Abdomen: no tenderness. Bowel sounds positive.  Musculoskeletal: no clubbing / cyanosis.  Skin: no rashes, lesions, ulcers. No  induration Neurologic: CN 2-12 grossly intact. Strength 5/5 in all 4.  Psychiatric: Normal judgment and insight. Alert and oriented x 3. Normal mood.    Data Reviewed: I have independently reviewed following labs and imaging studies   CBC: Recent Labs  Lab 07/21/18 0531  07/21/18 1758 07/21/18 2125 07/22/18 0328 07/22/18 1507 07/23/18 0330  WBC 16.7*   < > 18.2* 16.2* 14.3* 17.2* 15.3*  NEUTROABS 12.8*  --   --   --   --   --  12.4*  HGB 11.6*   < > 11.2* 10.6* 10.3* 10.5* 9.9*  HCT 35.9*   < > 31.5* 31.5* 30.4* 31.5* 29.5*  MCV 101.4*   < > 91.8 95.2 95.6 96.6 96.1  PLT 172   < > 118* 128* 141* 126* 115*   < > = values in this interval not displayed.   Basic Metabolic Panel: Recent Labs  Lab 07/21/18 0531 07/21/18 0542 07/22/18 0328 07/23/18 0330  NA 138 140 135 134*  K 4.4 4.4 4.1 4.1  CL 104 102 103 106  CO2 25  --  24 24  GLUCOSE 138* 132* 97 98  BUN 28* 30* 28* 20  CREATININE 1.36* 1.40* 1.09 1.10  CALCIUM 8.3*  --  8.0* 7.7*  MG  --   --   --  2.0   GFR: Estimated Creatinine Clearance: 52.3 mL/min (by C-G formula based on SCr of 1.1 mg/dL). Liver Function Tests: Recent Labs  Lab 07/21/18 0531 07/23/18 0330  AST 39 35  ALT 38 28  ALKPHOS 68 60  BILITOT 0.7 1.0  PROT 6.0* 5.3*  ALBUMIN 3.0* 2.5*   No results for input(s): LIPASE, AMYLASE in the last 168 hours. No results for input(s): AMMONIA in the last 168 hours. Coagulation Profile: Recent Labs  Lab 07/21/18 0531 07/23/18 0330  INR 1.19 1.17   Cardiac Enzymes: No results for input(s): CKTOTAL, CKMB, CKMBINDEX, TROPONINI in the last 168 hours. BNP (last 3 results) Recent Labs    12/16/17 1221  PROBNP 673.0*   HbA1C: No results for input(s): HGBA1C in the last 72 hours. CBG: No results for input(s): GLUCAP in the last 168 hours. Lipid Profile: No results for input(s): CHOL, HDL, LDLCALC, TRIG, CHOLHDL, LDLDIRECT in the last 72 hours. Thyroid Function Tests: No results for input(s):  TSH, T4TOTAL, FREET4, T3FREE, THYROIDAB in the last 72 hours. Anemia Panel: No results for input(s): VITAMINB12, FOLATE, FERRITIN, TIBC, IRON, RETICCTPCT in the last 72 hours. Urine analysis:    Component Value Date/Time   COLORURINE YELLOW 04/14/2016 [place] 04/14/2016 1028   LABSPEC 1.013 04/14/2016 1028   PHURINE 6.0 04/14/2016 1028   GLUCOSEU NEGATIVE 04/14/2016 1028   HGBUR NEGATIVE 04/14/2016 1028   [place] 04/14/2016 1028   KETONESUR NEGATIVE 04/14/2016 1028   PROTEINUR NEGATIVE 04/14/2016 1028   UROBILINOGEN 2.0 (H) 10/28/2012 0820   NITRITE NEGATIVE 04/14/2016 1028   LEUKOCYTESUR NEGATIVE 04/14/2016 1028   Sepsis Labs: Invalid input(s): PROCALCITONIN, LACTICIDVEN  No results found for this or any previous visit (from the past 240 hour(s)).  Radiology Studies: Nm Gi Blood Loss  Result Date: 07/21/2018 CLINICAL DATA:  GI bleeding. Syncopal spell in the bathroom. Bright red blood per rectum. EXAM: NUCLEAR MEDICINE GASTROINTESTINAL BLEEDING SCAN TECHNIQUE: Sequential abdominal images were obtained following intravenous administration of Tc-54m labeled red blood cells. RADIOPHARMACEUTICALS:  27.3 mCi Tc-64m pertechnetate in-vitro labeled red cells. COMPARISON:  CT of the chest on 07/21/2018 FINDINGS: Expected blood pool activity identified. The study is carried out to 120 minutes. There is no source of bleeding detected during the exam. IMPRESSION: Study is negative for the site of GI bleeding. Electronically Signed   By: Nolon Nations M.D.   On: 07/21/2018 21:23     Marzetta Board, MD, PhD [facility] Pager [phone]  If 7PM-7AM, please contact night-coverage [email or web address] Password [email or web address] 07/23/2018, 12:07 PM

## 2018-07-23 NOTE — Care Management Note (Addendum)
Case Management Note  Patient Details  Name: [name] MRN: [id number] Date of Birth: [birth year]  Subjective/Objective:    From home, has care givers , but may need higher level of care. Presents with lower GIB/snycope. GI consulted, liq diet. Uses a walker at home.   Pt eval rec SNF. CSW referral.                 Action/Plan: NCM will follow for transition of care needs.   Expected Discharge Date:  07/23/18               Expected Discharge Plan:  Skilled Nursing Facility  In-House Referral:  Clinical Social Work  Discharge planning Services  CM Consult  Post Acute Care Choice:    Choice offered to:     DME Arranged:    DME Agency:     HH Arranged:    [place] Agency:     Status of Service:  In process, will continue to follow  If discussed at Long Length of Stay Meetings, dates discussed:    Additional Comments:  Zenon Mayo, RN 07/23/2018, 3:19 PM

## 2018-07-23 NOTE — Evaluation (Signed)
Physical Therapy Evaluation Patient Details Name: [name] MRN: [id number] DOB: Oct 19, 1931 Today's Date: 07/23/2018   History of Present Illness  Pt is an 84 y/o male admitted secondary to syncopal episode, thought to be secondary to symptomatic anemia from GI bleed. Pt with scalp laceration which was repaired. CT of head and neck negative for acute abnormality. PMH includes smoker, s/p ICD, PVD, HTN, COPD, CHF, CAD, and AAA repair.   Clinical Impression  Pt admitted secondary to problem above with deficits below. Pt unsteady during gait with RW and requiring min A for steadying. Pt reporting history of multiple falls at home within the past 6 months, and currently lives alone. Feel pt would benefit from short term SNF at d/c to increase independence and safety with mobility, however, pt may refuse. Will continue to follow acutely to maximize functional mobility independence and safety.     Follow Up Recommendations SNF;Supervision/Assistance - 24 hour    Equipment Recommendations  None recommended by PT    Recommendations for Other Services       Precautions / Restrictions Precautions Precautions: Fall Precaution Comments: Reports 5-6 falls within the past 6 months.  Restrictions Weight Bearing Restrictions: No      Mobility  Bed Mobility Overal bed mobility: Needs Assistance Bed Mobility: Supine to Sit     Supine to sit: Supervision     General bed mobility comments: Supervision and increased time to perform. Reports some dizziness upon sitting, however, subsided with seated rest.   Transfers Overall transfer level: Needs assistance Equipment used: Rolling walker (2 wheeled) Transfers: Sit to/from Stand Sit to Stand: Min assist;From elevated surface         General transfer comment: Min A for steadying assist. Pt pulling up on RW with one hand, so required stabilization.   Ambulation/Gait Ambulation/Gait assistance: Min assist Gait Distance (Feet): 20  Feet Assistive device: Rolling walker (2 wheeled) Gait Pattern/deviations: Step-through pattern;Decreased stride length;Trunk flexed Gait velocity: Decreased    General Gait Details: Slow, unsteady gait. Mild dizziness reported, however, subsided with seated rest following gait. Cues for upright posture and proximity to device.   Stairs            Wheelchair Mobility    Modified Rankin (Stroke Patients Only)       Balance Overall balance assessment: Needs assistance Sitting-balance support: No upper extremity supported;Feet supported Sitting balance-Leahy Scale: Fair     Standing balance support: Bilateral upper extremity supported;During functional activity Standing balance-Leahy Scale: Poor Standing balance comment: Reliant on BUE support                              Pertinent Vitals/Pain Pain Assessment: No/denies pain    Home Living Family/patient expects to be discharged to:: Private residence Living Arrangements: Alone Available Help at Discharge: Family;Available PRN/intermittently Type of Home: House Home Access: Stairs to enter Entrance Stairs-Rails: Right Entrance Stairs-Number of Steps: 2 Home Layout: One level Home Equipment: Walker - 2 wheels;Cane - single point;Bedside commode      Prior Function Level of Independence: Independent with assistive device(s)         Comments: Reports using RW for ambulation      Hand Dominance        Extremity/Trunk Assessment   Upper Extremity Assessment Upper Extremity Assessment: Generalized weakness    Lower Extremity Assessment Lower Extremity Assessment: Generalized weakness    Cervical / Trunk Assessment Cervical / Trunk Assessment: Kyphotic  Communication   Communication: HOH  Cognition Arousal/Alertness: Awake/alert Behavior During Therapy: WFL for tasks assessed/performed Overall Cognitive Status: Within Functional Limits for tasks assessed                                         General Comments      Exercises     Assessment/Plan    PT Assessment Patient needs continued PT services  PT Problem List Decreased strength;Decreased balance;Decreased mobility;Decreased knowledge of use of DME;Decreased knowledge of precautions       PT Treatment Interventions DME instruction;Gait training;Stair training;Functional mobility training;Therapeutic activities;Therapeutic exercise;Balance training;Patient/family education    PT Goals (Current goals can be found in the Care Plan section)  Acute Rehab PT Goals Patient Stated Goal: to go home  PT Goal Formulation: With patient Time For Goal Achievement: 08/06/18 Potential to Achieve Goals: Good    Frequency Min 2X/week   Barriers to discharge Decreased caregiver support      Co-evaluation               AM-PAC PT "6 Clicks" Daily Activity  Outcome Measure Difficulty turning over in bed (including adjusting bedclothes, sheets and blankets)?: A Little Difficulty moving from lying on back to sitting on the side of the bed? : A Lot Difficulty sitting down on and standing up from a chair with arms (e.g., wheelchair, bedside commode, etc,.)?: Unable Help needed moving to and from a bed to chair (including a wheelchair)?: A Little Help needed walking in hospital room?: A Little Help needed climbing 3-5 steps with a railing? : A Lot 6 Click Score: 14    End of Session Equipment Utilized During Treatment: Gait belt Activity Tolerance: Patient tolerated treatment well Patient left: in chair;with call bell/phone within reach;with chair alarm set Nurse Communication: Mobility status PT Visit Diagnosis: Unsteadiness on feet (R26.81);Muscle weakness (generalized) (M62.81);History of falling (Z91.81);Repeated falls (R29.6)    Time: 3500-9381 PT Time Calculation (min) (ACUTE ONLY): 21 min   Charges:   PT Evaluation $PT Eval Moderate Complexity: [place], PT,  DPT  Acute Rehabilitation Services  Pager: [phone] Office: [phone]   Rudean Hitt 07/23/2018, 11:21 AM

## 2018-07-23 NOTE — NC FL2 (Signed)
[place] LEVEL OF CARE SCREENING TOOL     IDENTIFICATION  Patient Name: [name] Birthdate: 12/15/1930 Sex: male Admission Date (Current Location): 07/21/2018  [facility] and [place] Number:  Herbalist and Address:  [facility], [place] [address] [place], [place] [address]      Provider Number: [id number]  Attending Physician Name and Address:  Caren Griffins, MD  Relative Name and Phone Number:  Darlina Sicilian; daughter; [phone]    Current Level of Care: Hospital Recommended Level of Care: [place] Prior Approval Number:    Date Approved/Denied:   PASRR Number: [id number]  Discharge Plan: SNF    Current Diagnoses: Patient Active Problem List   Diagnosis Date Noted  . Lower GI bleeding 07/21/2018  . Forehead laceration, initial encounter 07/21/2018  . CKD (chronic kidney disease) stage 3, GFR 30-59 ml/min (HCC) 07/21/2018  . History of pneumonia 07/15/2018  . Physical deconditioning 11/13/2017  . Weakness 11/12/2017  . Falls 11/12/2017  . Pleural effusion   . Pressure injury of skin 10/01/2017  . CHF exacerbation ([place]) 09/30/2017  . Shingles outbreak 05/27/2017  . Seborrheic dermatitis 05/27/2017  . Acute on chronic respiratory failure with hypoxia ([place]) 01/13/2017  . COPD with acute bronchitis ([place]) 01/13/2017  . CAD S/P percutaneous coronary angioplasty   . Chronic combined systolic and diastolic CHF (congestive heart failure) ([place])   . Blunt chest trauma 05/04/2016  . 4.1 cm Aneurysm of thoracic aorta ([place]) 04/27/2016  . SIADH (syndrome of inappropriate ADH production) ([place])   . Orthostatic hypotension 04/12/2016  . TBI (traumatic brain injury) ([place])   . Acute on chronic combined systolic and diastolic congestive heart failure ([place])   . S/P ORIF (open reduction internal fixation) fracture   . Thrombocytopenia ([place])   . Urinary retention   . Slow transit constipation   . Thyroid  activity decreased   . Traumatic subdural hematoma with loss of consciousness ([place])   . Pulmonary hypertension ([place])   . Cardiomyopathy, ischemic   . Subdural hematoma ([place])   . Cardiac arrest ([place]) 03/29/2016  . 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 CAP (community acquired pneumonia) 04/28/2015  . T wave over sensing resulting in inappropriate shocks 08/14/2013  . Vitamin D deficiency disease 04/09/2012  . Actinic skin damage 05/22/2011  . Alcohol use 03/22/2011  . Other dysphagia 02/16/2011  . Carotid arterial disease ([place]) 11/17/2009  . GOUT 09/23/2009  . Age-related osteoporosis with current pathological fracture 09/23/2009  . SYNCOPE 04/19/2009  . Closed fracture of bone 07/27/2008  . Automatic implantable cardioverter-defibrillator in situ 04/13/2008  . COLONIC POLYPS 12/09/2007  . DIVERTICULOSIS OF COLON 12/09/2007  . Essential hypertension 12/08/2007  . Peripheral vascular disease ([place]) 12/08/2007  . COPD GOLD III 12/08/2007  . Osteoarthritis 12/08/2007    Orientation RESPIRATION BLADDER Height & Weight     Self, Time, Situation, Place  Normal   Weight: 178 lb (80.7 kg) Height:  5\' 11"  (180.3 cm)  BEHAVIORAL SYMPTOMS/MOOD NEUROLOGICAL BOWEL NUTRITION STATUS        Diet(see discharge summary)  AMBULATORY STATUS COMMUNICATION OF NEEDS Skin   Extensive Assist   Surgical wounds(dehisced wound on left wrist)                       Personal Care Assistance Level of Assistance  Bathing, Feeding, Dressing Bathing Assistance: Maximum assistance Feeding assistance: Independent Dressing Assistance: Maximum assistance  Functional Limitations Info  Sight, Hearing, Speech Sight Info: Adequate Hearing Info: Adequate Speech Info: Adequate    SPECIAL CARE FACTORS FREQUENCY  PT (By licensed PT), OT (By licensed OT)     PT Frequency: 5x week OT Frequency: 5x week            Contractures Contractures Info: Not present    Additional Factors  Info  Code Status, Allergies, Psychotropic Code Status Info: DNR Allergies Info: Lisinopril Psychotropic Info: sertraline (ZOLOFT) tablet 50 mg daily at bedtime         Current Medications (07/23/2018):  This is the current hospital active medication list Current Facility-Administered Medications  Medication Dose Route Frequency Provider Last Rate Last Dose  . 0.9 %  sodium chloride infusion (Manually program via Guardrails IV Fluids)   Intravenous Once Karmen Bongo, MD      . acetaminophen (TYLENOL) tablet 650 mg  650 mg Oral Q6H PRN Karmen Bongo, MD       Or  . acetaminophen (TYLENOL) suppository 650 mg  650 mg Rectal Q6H PRN Karmen Bongo, MD      . albuterol (PROVENTIL) (2.5 MG/3ML) 0.083% nebulizer solution 3 mL  3 mL Inhalation Q6H PRN Karmen Bongo, MD      . allopurinol (ZYLOPRIM) tablet 100 mg  100 mg Oral Daily Karmen Bongo, MD   100 mg at 07/23/18 4403  . amiodarone (PACERONE) tablet 200 mg  200 mg Oral Daily Karmen Bongo, MD   200 mg at 07/23/18 4742  . amoxicillin-clavulanate (AUGMENTIN) 875-125 MG per tablet 1 tablet  1 tablet Oral Q12H Lavina Hamman, MD   1 tablet at 07/23/18 5956  . guaiFENesin (MUCINEX) 12 hr tablet 1,200 mg  1,200 mg Oral q morning - 10a Karmen Bongo, MD   1,200 mg at 07/23/18 3875   And  . guaiFENesin (MUCINEX) 12 hr tablet 600 mg  600 mg Oral Ivery Quale, MD   600 mg at 07/22/18 2201  . levothyroxine (SYNTHROID, LEVOTHROID) tablet 50 mcg  50 mcg Oral QAC breakfast Karmen Bongo, MD   50 mcg at 07/23/18 0920  . magnesium oxide (MAG-OX) tablet 200 mg  200 mg Oral Daily Karmen Bongo, MD   200 mg at 07/23/18 0921  . midodrine (PROAMATINE) tablet 2.5 mg  2.5 mg Oral TID WC Karmen Bongo, MD   2.5 mg at 07/23/18 0931  . mometasone-formoterol (DULERA) 200-5 MCG/ACT inhaler 2 puff  2 puff Inhalation BID Karmen Bongo, MD   2 puff at 07/23/18 0723  . multivitamin with minerals tablet 1 tablet  1 tablet Oral Daily Karmen Bongo, MD   1 tablet at 07/23/18 [phone]  . ondansetron (ZOFRAN) tablet 4 mg  4 mg Oral Q6H PRN Karmen Bongo, MD       Or  . ondansetron [facility]) injection 4 mg  4 mg Intravenous Q6H PRN Karmen Bongo, MD      . oxybutynin (DITROPAN) tablet 5 mg  5 mg Oral BID Karmen Bongo, MD   5 mg at 07/23/18 2951  . pravastatin (PRAVACHOL) tablet 40 mg  40 mg Oral Daily Karmen Bongo, MD   40 mg at 07/23/18 0921  . sertraline (ZOLOFT) tablet 50 mg  50 mg Oral Ivery Quale, MD   50 mg at 07/22/18 2201  . thiamine (VITAMIN B-1) tablet 100 mg  100 mg Oral Daily Karmen Bongo, MD   100 mg at 07/23/18 8841  . traMADol (ULTRAM) tablet 50 mg  50 mg Oral Q6H  PRN Karmen Bongo, MD         Discharge Medications: Please see discharge summary for a list of discharge medications.  Relevant Imaging Results:  Relevant Lab Results:   Additional Information SS#[id number] [place], [place]

## 2018-07-24 LAB — CBC
HCT: 28.9 % — ABNORMAL LOW (ref 39.0–52.0)
Hemoglobin: 9.8 g/dL — ABNORMAL LOW (ref 13.0–17.0)
MCH: 32.7 pg (ref 26.0–34.0)
MCHC: 33.9 g/dL (ref 30.0–36.0)
MCV: 96.3 fL (ref 78.0–100.0)
PLATELETS: 131 10*3/uL — AB (ref 150–400)
RBC: 3 MIL/uL — ABNORMAL LOW (ref 4.22–5.81)
RDW: 15 % (ref 11.5–15.5)
WBC: 11.6 10*3/uL — ABNORMAL HIGH (ref 4.0–10.5)

## 2018-07-24 LAB — BASIC METABOLIC PANEL
Anion gap: 6 (ref 5–15)
BUN: 17 mg/dL (ref 8–23)
CALCIUM: 8 mg/dL — AB (ref 8.9–10.3)
CHLORIDE: 102 mmol/L (ref 98–111)
CO2: 25 mmol/L (ref 22–32)
CREATININE: 1.03 mg/dL (ref 0.61–1.24)
GFR calc non Af Amer: >60 mL/min (ref >60)
GLUCOSE: 106 mg/dL — AB (ref 70–99)
Potassium: 3.5 mmol/L (ref 3.5–5.1)
Sodium: 133 mmol/L — ABNORMAL LOW (ref 135–145)

## 2018-07-24 NOTE — Discharge Instructions (Signed)
Please hold Plavix until seen by cardiology or PCP  Please follow up with Primary MD in 10 days to evaluate whether your staples could be removed  Follow with Noralee Space, MD in 5-7 days  Please get a complete blood count and chemistry panel checked by your Primary MD at your next visit, and again as instructed by your Primary MD. Please get your medications reviewed and adjusted by your Primary MD.  Please request your Primary MD to go over all Hospital Tests and Procedure/Radiological results at the follow up, please get all Hospital records sent to your Prim MD by signing hospital release before you go home.  If you had Pneumonia of Lung problems at the Hospital: Please get a 2 view Chest X ray done in 6-8 weeks after hospital discharge or sooner if instructed by your Primary MD.  If you have Congestive Heart Failure: Please call your Cardiologist or Primary MD anytime you have any of the following symptoms:  1) 3 pound weight gain in 24 hours or 5 pounds in 1 week  2) shortness of breath, with or without a dry hacking cough  3) swelling in the hands, feet or stomach  4) if you have to sleep on extra pillows at night in order to breathe  Follow cardiac low salt diet and 1.5 lit/day fluid restriction.  If you have diabetes Accuchecks 4 times/day, Once in AM empty stomach and then before each meal. Log in all results and show them to your primary doctor at your next visit. If any glucose reading is under 80 or above 300 call your primary MD immediately.  If you have Seizure/Convulsions/Epilepsy: Please do not drive, operate heavy machinery, participate in activities at heights or participate in high speed sports until you have seen by Primary MD or a Neurologist and advised to do so again.  If you had Gastrointestinal Bleeding: Please ask your Primary MD to check a complete blood count within one week of discharge or at your next visit. Your endoscopic/colonoscopic biopsies that are  pending at the time of discharge, will also need to followed by your Primary MD.  Get Medicines reviewed and adjusted. Please take all your medications with you for your next visit with your Primary MD  Please request your Primary MD to go over all hospital tests and procedure/radiological results at the follow up, please ask your Primary MD to get all Hospital records sent to his/her office.  If you experience worsening of your admission symptoms, develop shortness of breath, life threatening emergency, suicidal or homicidal thoughts you must seek medical attention immediately by calling 911 or calling your MD immediately  if symptoms less severe.  You must read complete instructions/literature along with all the possible adverse reactions/side effects for all the Medicines you take and that have been prescribed to you. Take any new Medicines after you have completely understood and accpet all the possible adverse reactions/side effects.   Do not drive or operate heavy machinery when taking Pain medications.   Do not take more than prescribed Pain, Sleep and Anxiety Medications  Special Instructions: If you have smoked or chewed Tobacco  in the last 2 yrs please stop smoking, stop any regular Alcohol  and or any Recreational drug use.  Wear Seat belts while driving.  Please note You were cared for by a hospitalist during your hospital stay. If you have any questions about your discharge medications or the care you received while you were in the hospital after  you are discharged, you can call the unit and asked to speak with the hospitalist on call if the hospitalist that took care of you is not available. Once you are discharged, your primary care physician will handle any further medical issues. Please note that NO REFILLS for any discharge medications will be authorized once you are discharged, as it is imperative that you return to your primary care physician (or establish a relationship with  a primary care physician if you do not have one) for your aftercare needs so that they can reassess your need for medications and monitor your lab values.  You can reach the hospitalist office at phone [phone] or fax [phone]   If you do not have a primary care physician, you can call [phone] for a physician referral.  Activity: As tolerated with Full fall precautions use walker/cane & assistance as needed  Diet: soft  Disposition Home

## 2018-07-24 NOTE — Discharge Summary (Signed)
Physician Discharge Summary  [name] ZTI:[id number] DOB: July 21, 1931 DOA: 07/21/2018  PCP: Noralee Space, MD  Admit date: 07/21/2018 Discharge date: 07/24/2018  Admitted From: Home Disposition: Home (refuses SNF)  Recommendations for Outpatient Follow-up:  1. Follow up with PCP in 1-2 weeks 2. Please obtain BMP/CBC in one week 3. Hold Plavix on discharge for now, recommend outpatient follow-up with PCP and cardiology to discuss timing for resumption given GI bleed  Home Health: PT, RN Equipment/Devices: None  Discharge Condition: Stable CODE STATUS: DNR Diet recommendation: Regular  HPI: Per Dr. Levon Hedger is a 84 y.o. male with medical history significant of tobacco dependence; AICD placement for NICM; PVD; HTN; HLD; COPD; chronic combined CHF; CAD s/p stenting; AAA s/p repair (2002); and carotid disease presenting with rectal bleeding with syncope. He thought he had diarrhea, instead he was bleeding all over the place.  He passed out and hit his head on the toilet.  He was normal when he went to bed.  He woke up about 1-2AM.  He thinks he had 2 episodes of bloody BMs.  He did not have any pain.  He got lightheaded and passed out.  No prior h/o similar.  His last c-scope was >10 years ago.  He does take blood thinners, uncertain which.  He currently feels like "a horse that's been ridden too far and put up wet."  He does have neck discomfort but does not cause pain.  He recently finished steroids about 1 week ago because Dr. Lenna Gilford "heard congestion in my lungs and I had had pneumonia earlier this year."  He thinks he also took antibiotics, "a horse pill" - apparently Augmentin and/or Azithromycin as well as a prednisone dose pack  Hospital Course: Lower GI bleed -Likely diverticular, gastroenterology consulted followed patient while hospitalized. He underwent a bleeding scan which was negative.  Clinically his bleeding is stopped, hemoglobin is remained stable and started to  increase, he is tolerating a regular diet and will be discharged home in stable condition. Fall/syncope -Possible transient hypotension in the setting of #1, laceration stapled in the ER, CT head and C-spine are unremarkable.  Recommend outpatient follow-up in 10 to 14 days for staples removal.  Of note, physical therapy did recommend SNF but patient declined that and will maximize home health Hypertension /history of orthostatic hypotension -continue home regimen Chronic combined systolic and diastolic CHF -continue home regimen History of V. tach with ICD in place -Continue amiodarone Recent community-acquired pneumonia -Starting oral Augmentin on 9/25, finished course while hospitalized Hypothyroidism -Continue Synthroid Coronary artery disease -No chest pain, followed by Dr. Percival Spanish as an outpatient, most recent cath was in June 2017 without need for revascularization at that time   Discharge Diagnoses:  Principal Problem:   Lower GI bleeding Active Problems:   Essential hypertension   SYNCOPE   Alcohol use   Orthostatic hypotension   Chronic combined systolic and diastolic CHF (congestive heart failure) (HCC)   Forehead laceration, initial encounter   CKD (chronic kidney disease) stage 3, GFR 30-59 ml/min (HCC)     Discharge Instructions   Allergies as of 07/24/2018      Reactions   Lisinopril    BP dropped too low per daughter      Medication List    STOP taking these medications   amoxicillin-clavulanate 875-125 MG tablet Commonly known as:  AUGMENTIN   clopidogrel 75 MG tablet Commonly known as:  PLAVIX     TAKE these  medications   albuterol 108 (90 Base) MCG/ACT inhaler Commonly known as:  PROVENTIL HFA;VENTOLIN HFA Inhale 2 puffs into the lungs every 6 (six) hours as needed for wheezing or shortness of breath.   alendronate 70 MG tablet Commonly known as:  FOSAMAX Take 1 tablet (70 mg total) by mouth once a week. Take with a full glass of water on an empty  stomach.   allopurinol 100 MG tablet Commonly known as:  ZYLOPRIM TAKE 1 TABLET ONCE DAILY.   amiodarone 200 MG tablet Commonly known as:  PACERONE TAKE 1 TABLET ONCE DAILY.   carvedilol 3.125 MG tablet Commonly known as:  COREG TAKE 1 TABLET BY MOUTH TWICE DAILY WITH A MEAL. What changed:  See the new instructions.   cholecalciferol 1000 units tablet Commonly known as:  VITAMIN D Take 1,000 Units by mouth daily.   fluticasone 0.05 % cream Commonly known as:  CUTIVATE Apply 1 application topically 2 (two) times daily.   fluticasone-salmeterol 115-21 MCG/ACT inhaler Commonly known as:  ADVAIR HFA Inhale 2 puffs into the lungs 2 (two) times daily.   furosemide 20 MG tablet Commonly known as:  LASIX Take 1 tablet Monday, Wednesday and Friday What changed:    how much to take  how to take this  when to take this  additional instructions   guaiFENesin 600 MG 12 hr tablet Commonly known as:  MUCINEX Take 600 mg by mouth 2 (two) times daily.   levothyroxine 50 MCG tablet Commonly known as:  SYNTHROID, LEVOTHROID Take 50 mcg by mouth daily before breakfast.   magnesium oxide 400 (241.3 Mg) MG tablet Commonly known as:  MAG-OX Take 0.5 tablets (200 mg total) by mouth daily.   methylPREDNISolone 4 MG Tbpk tablet Commonly known as:  MEDROL DOSEPAK Take 4 mg by mouth.   midodrine 2.5 MG tablet Commonly known as:  PROAMATINE TAKE 1 TABLET 3 TIMES DAILY WITH MEALS. What changed:  See the new instructions.   multivitamin capsule Take 1 capsule by mouth daily.   oxybutynin 5 MG tablet Commonly known as:  DITROPAN TAKE 1 TABLET BY MOUTH TWICE DAILY. What changed:  Another medication with the same name was added. Make sure you understand how and when to take each.   oxybutynin 5 MG tablet Commonly known as:  DITROPAN TAKE 1 TABLET BY MOUTH TWICE DAILY. What changed:  You were already taking a medication with the same name, and this prescription was added. Make  sure you understand how and when to take each.   polyethylene glycol powder powder Commonly known as:  GLYCOLAX/MIRALAX 1 capful in [place] water twice daily What changed:  additional instructions   potassium chloride 10 MEQ tablet Commonly known as:  K-DUR Take 1 tablet Monday, Wednesday and Friday What changed:    how much to take  how to take this  when to take this  additional instructions   pravastatin 40 MG tablet Commonly known as:  PRAVACHOL TAKE 1 TABLET ONCE DAILY.   PRESERVISION AREDS 2 PO Take 1 capsule by mouth daily.   sertraline 50 MG tablet Commonly known as:  ZOLOFT TAKE ONE TABLET AT BEDTIME.   thiamine 100 MG tablet Take 1 tablet (100 mg total) by mouth daily.   traMADol 50 MG tablet Commonly known as:  ULTRAM Take 1 tablet (50 mg total) by mouth every 6 (six) hours as needed for severe pain.      Follow-up Information    Noralee Space, MD. Schedule an appointment  as soon as possible for a visit in 2 week(s).   Specialty:  Pulmonary Disease Why:  Appointment: August 07, 2018 @ 9am Contact information: [place] [address] [phone]        Minus Breeding, MD. Schedule an appointment as soon as possible for a visit in 2 week(s).   Specialty:  Cardiology Why:  Appointment: August 05, 2018 @ 10:15am Contact information: [address] [place] [address] [phone]        Health, [facility] Follow up.   Specialty:  Home Health Services Why:  HHPT, [facility] Contact information: [address] [place] [address] [phone]           Consultations:  Gastroenterology  Procedures/Studies  Ct Head Wo Contrast  Result Date: 07/21/2018 CLINICAL DATA:  Pain following fall EXAM: CT HEAD WITHOUT CONTRAST CT CERVICAL SPINE WITHOUT CONTRAST TECHNIQUE: Multidetector CT imaging of the head and cervical spine was performed following the standard protocol without intravenous contrast.  Multiplanar CT image reconstructions of the cervical spine were also generated. CT HEAD FINDINGS Brain: There is moderate diffuse atrophy. There is no intracranial mass, hemorrhage, extra-axial fluid collection, or midline shift. There is decreased attenuation throughout much of the centra semiovale bilaterally, felt to represent supratentorial small vessel disease. There is evidence of a prior small infarct in the head of the caudate nucleus on the right. No acute appearing infarct is evident. Vascular: No hyperdense vessels are evident. There is calcification in each distal vertebral artery as well as in the basilar artery and both carotid siphon regions. Skull: There are skin staples overlying the superior most aspect of the scalp posteriorly. There is mild hematoma in this area. The bony calvarium appears intact. Minimal soft tissue air is noted in the scalp over the left frontal bone. Sinuses/Orbits: There is opacification of the right maxillary antrum as well as multiple right-sided ethmoid air cells. There is also opacification in the right frontal sinus region. There is evidence of previous antrostomies bilaterally. Orbits appear symmetric bilaterally. Other: Mastoid air cells are clear. CT CERVICAL SPINE FINDINGS Alignment: There is no appreciable spondylolisthesis. Skull base and vertebrae: The skull base and craniocervical junction regions appear normal. Bones are osteoporotic. No fracture evident. There are no blastic or lytic bone lesions. Soft tissues and spinal canal: Prevertebral soft tissues and predental space regions are within normal limits. No paraspinous lesions. No cord canal hematoma. Disc levels: There is severe disc space narrowing at C5-6, C6-7, and C7-T1. There is multilevel facet osteoarthritic change. There is exit foraminal narrowing due to bony hypertrophy at C2-3 on the left, at C3-4 on the left, at C4-5 on the left, to a lesser extent at C5-6 on the left, and C6-7 on the left. There  is marked impression on the exiting nerve root at C3-4 and C4-5 on the left due to bony hypertrophy. No disc extrusion or stenosis. Upper chest: Visualized upper lung zones are clear. Other: There is calcification in each subclavian and carotid artery. IMPRESSION: CT head: Atrophy with supratentorial small vessel disease. Prior small infarct in the head of the caudate nucleus on the right. No mass or hemorrhage. No acute infarct. There are foci of arterial vascular calcification. There are skin staples over the skull vertex. No fracture evident. Extensive paranasal sinus disease at multiple sites on the right side. CT cervical spine: No fracture or appreciable spondylolisthesis. Extensive multifocal arthropathy. Exit foraminal narrowing noted at multiple levels on the left, most  severe at C3-4 and C4-5. There is calcification in both subclavian and carotid arteries. Electronically Signed   By: Lowella Grip III M.D.   On: 07/21/2018 07:26   Ct Cervical Spine Wo Contrast  Result Date: 07/21/2018 CLINICAL DATA:  Pain following fall EXAM: CT HEAD WITHOUT CONTRAST CT CERVICAL SPINE WITHOUT CONTRAST TECHNIQUE: Multidetector CT imaging of the head and cervical spine was performed following the standard protocol without intravenous contrast. Multiplanar CT image reconstructions of the cervical spine were also generated. CT HEAD FINDINGS Brain: There is moderate diffuse atrophy. There is no intracranial mass, hemorrhage, extra-axial fluid collection, or midline shift. There is decreased attenuation throughout much of the centra semiovale bilaterally, felt to represent supratentorial small vessel disease. There is evidence of a prior small infarct in the head of the caudate nucleus on the right. No acute appearing infarct is evident. Vascular: No hyperdense vessels are evident. There is calcification in each distal vertebral artery as well as in the basilar artery and both carotid siphon regions. Skull: There are  skin staples overlying the superior most aspect of the scalp posteriorly. There is mild hematoma in this area. The bony calvarium appears intact. Minimal soft tissue air is noted in the scalp over the left frontal bone. Sinuses/Orbits: There is opacification of the right maxillary antrum as well as multiple right-sided ethmoid air cells. There is also opacification in the right frontal sinus region. There is evidence of previous antrostomies bilaterally. Orbits appear symmetric bilaterally. Other: Mastoid air cells are clear. CT CERVICAL SPINE FINDINGS Alignment: There is no appreciable spondylolisthesis. Skull base and vertebrae: The skull base and craniocervical junction regions appear normal. Bones are osteoporotic. No fracture evident. There are no blastic or lytic bone lesions. Soft tissues and spinal canal: Prevertebral soft tissues and predental space regions are within normal limits. No paraspinous lesions. No cord canal hematoma. Disc levels: There is severe disc space narrowing at C5-6, C6-7, and C7-T1. There is multilevel facet osteoarthritic change. There is exit foraminal narrowing due to bony hypertrophy at C2-3 on the left, at C3-4 on the left, at C4-5 on the left, to a lesser extent at C5-6 on the left, and C6-7 on the left. There is marked impression on the exiting nerve root at C3-4 and C4-5 on the left due to bony hypertrophy. No disc extrusion or stenosis. Upper chest: Visualized upper lung zones are clear. Other: There is calcification in each subclavian and carotid artery. IMPRESSION: CT head: Atrophy with supratentorial small vessel disease. Prior small infarct in the head of the caudate nucleus on the right. No mass or hemorrhage. No acute infarct. There are foci of arterial vascular calcification. There are skin staples over the skull vertex. No fracture evident. Extensive paranasal sinus disease at multiple sites on the right side. CT cervical spine: No fracture or appreciable  spondylolisthesis. Extensive multifocal arthropathy. Exit foraminal narrowing noted at multiple levels on the left, most severe at C3-4 and C4-5. There is calcification in both subclavian and carotid arteries. Electronically Signed   By: Lowella Grip III M.D.   On: 07/21/2018 07:26   Nm Gi Blood Loss  Result Date: 07/21/2018 CLINICAL DATA:  GI bleeding. Syncopal spell in the bathroom. Bright red blood per rectum. EXAM: NUCLEAR MEDICINE GASTROINTESTINAL BLEEDING SCAN TECHNIQUE: Sequential abdominal images were obtained following intravenous administration of Tc-73m labeled red blood cells. RADIOPHARMACEUTICALS:  27.3 mCi Tc-37m pertechnetate in-vitro labeled red cells. COMPARISON:  CT of the chest on 07/21/2018 FINDINGS: Expected blood pool activity identified.  The study is carried out to 120 minutes. There is no source of bleeding detected during the exam. IMPRESSION: Study is negative for the site of GI bleeding. Electronically Signed   By: Nolon Nations M.D.   On: 07/21/2018 21:23   Ct Angio Chest/abd/pel For Dissection W And/or Wo Contrast  Result Date: 07/21/2018 CLINICAL DATA:  GI bleed.  Fall. EXAM: CT ANGIOGRAPHY CHEST, ABDOMEN AND PELVIS TECHNIQUE: Multidetector CT imaging through the chest, abdomen and pelvis was performed using the standard protocol during bolus administration of intravenous contrast. Multiplanar reconstructed images and MIPs were obtained and reviewed to evaluate the vascular anatomy. CONTRAST:  85mL ISOVUE-370 IOPAMIDOL (ISOVUE-370) INJECTION 76% COMPARISON:  None. FINDINGS: CTA CHEST FINDINGS Cardiovascular: Severe diffuse aortic atherosclerosis. Irregular calcified and noncalcified plaque noted throughout the descending thoracic aorta. Probable penetrating ulcer noted along the posterior wall of the distal descending thoracic aorta. Cardiomegaly. Diffuse coronary artery calcifications. Pacer wires present in the right heart. No evidence of aortic dissection.  Mediastinum/Nodes: No mediastinal, hilar, or axillary adenopathy. Lungs/Pleura: Mild emphysema. Biapical scarring. Rounded airspace opacity noted at the right lung base posteriorly could reflect rounded pneumonia or atelectasis. No confluent opacity on the left. Musculoskeletal: No acute bony abnormality. Prior vertebroplasty at T12. Old right rib fractures noted. Review of the MIP images confirms the above findings. CTA ABDOMEN AND PELVIS FINDINGS VASCULAR Aorta: Irregular calcified and noncalcified plaque throughout the aorta. No evidence of aneurysm or dissection. Celiac: Widely patent, calcified. SMA: Widely patent, calcified Renals: Widely patent, calcified. IMA: Patent Inflow: Calcified iliac vessels. No aneurysm or dissection. 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 Veins: Grossly unremarkable. Review of the MIP images confirms the above findings. NON-VASCULAR Hepatobiliary: No focal hepatic abnormality. Gallbladder unremarkable. Pancreas: No focal abnormality or ductal dilatation. Spleen: No focal abnormality.  Normal size. Adrenals/Urinary Tract: Area of decreased enhancement noted posteriorly in the left kidney could reflect area of infarct. No hydronephrosis or suspicious renal mass. Adrenal glands and urinary bladder unremarkable. Stomach/Bowel: Colonic diverticulosis. No active diverticulitis. Stomach and small bowel decompressed, unremarkable. Large stool burden in the rectosigmoid colon. Lymphatic: No adenopathy Reproductive: Central calcifications within the prostate. Other: No free fluid or free air. Musculoskeletal: Severe compression deformities noted at L2 and L3, likely chronic. Prior vertebroplasty at T12. Diffuse degenerative changes. No acute bony abnormality. Review of the MIP images confirms the above findings. IMPRESSION: Diffuse irregular aortic atherosclerotic plaque. Possible ulcerative plaque in the distal descending thoracic aorta posteriorly. No evidence  of aneurysm or dissection. Cardiomegaly, coronary artery disease. Rounded airspace opacity posteriorly in the right lower lobe could reflect rounded pneumonia or atelectasis. Area of decreased enhancement posteriorly in the left kidney suspicious for renal infarct. Sigmoid diverticulosis. Moderate stool burden in the rectosigmoid colon. Chronic appearing compression deformities at L2 and L3. Prior vertebroplasty at T12. Electronically Signed   By: Rolm Baptise M.D.   On: 07/21/2018 07:40      Subjective: - no chest pain, shortness of breath, no abdominal pain, nausea or vomiting.   Discharge Exam: Vitals:   07/24/18 0800 07/24/18 0819  BP: 122/65   Pulse: 60   Resp: 20   Temp: 98.1 F (36.7 C)   SpO2: 100% 99%    General: Pt is alert, awake, not in acute distress Cardiovascular: RRR, S1/S2 +, no rubs, no gallops Respiratory: CTA bilaterally, no wheezing, no rhonchi Abdominal: Soft, NT, ND, bowel sounds + Extremities: no edema, no cyanosis    The results of significant diagnostics  from this hospitalization (including imaging, microbiology, ancillary and laboratory) are listed below for reference.     Microbiology: No results found for this or any previous visit (from the past 240 hour(s)).   Labs: BNP (last 3 results) Recent Labs    09/30/17 0956  BNP 5,374.8*   Basic Metabolic Panel: Recent Labs  Lab 07/21/18 0531 07/21/18 0542 07/22/18 0328 07/23/18 0330 07/24/18 0348  NA 138 140 135 134* 133*  K 4.4 4.4 4.1 4.1 3.5  CL 104 102 103 106 102  CO2 25  --  24 24 25   GLUCOSE 138* 132* 97 98 106*  BUN 28* 30* 28* 20 17  CREATININE 1.36* 1.40* 1.09 1.10 1.03  CALCIUM 8.3*  --  8.0* 7.7* 8.0*  MG  --   --   --  2.0  --    Liver Function Tests: Recent Labs  Lab 07/21/18 0531 07/23/18 0330  AST 39 35  ALT 38 28  ALKPHOS 68 60  BILITOT 0.7 1.0  PROT 6.0* 5.3*  ALBUMIN 3.0* 2.5*   No results for input(s): LIPASE, AMYLASE in the last 168 hours. No results  for input(s): AMMONIA in the last 168 hours. CBC: Recent Labs  Lab 07/21/18 0531  07/22/18 0328 07/22/18 1507 07/23/18 0330 07/23/18 1435 07/24/18 0348  WBC 16.7*   < > 14.3* 17.2* 15.3* 13.5* 11.6*  NEUTROABS 12.8*  --   --   --  12.4*  --   --   HGB 11.6*   < > 10.3* 10.5* 9.9* 9.7* 9.8*  HCT 35.9*   < > 30.4* 31.5* 29.5* 28.9* 28.9*  MCV 101.4*   < > 95.6 96.6 96.1 96.3 96.3  PLT 172   < > 141* 126* 115* 132* 131*   < > = values in this interval not displayed.   Cardiac Enzymes: No results for input(s): CKTOTAL, CKMB, CKMBINDEX, TROPONINI in the last 168 hours. BNP: Invalid input(s): POCBNP CBG: No results for input(s): GLUCAP in the last 168 hours. D-Dimer No results for input(s): DDIMER in the last 72 hours. Hgb A1c No results for input(s): HGBA1C in the last 72 hours. Lipid Profile No results for input(s): CHOL, HDL, LDLCALC, TRIG, CHOLHDL, LDLDIRECT in the last 72 hours. Thyroid function studies No results for input(s): TSH, T4TOTAL, T3FREE, THYROIDAB in the last 72 hours.  Invalid input(s): FREET3 Anemia work up No results for input(s): VITAMINB12, FOLATE, FERRITIN, TIBC, IRON, RETICCTPCT in the last 72 hours. Urinalysis    Component Value Date/Time   COLORURINE YELLOW 04/14/2016 1028   APPEARANCEUR CLEAR 04/14/2016 1028   LABSPEC 1.013 04/14/2016 1028   PHURINE 6.0 04/14/2016 1028   GLUCOSEU NEGATIVE 04/14/2016 1028   HGBUR NEGATIVE 04/14/2016 1028   BILIRUBINUR NEGATIVE 04/14/2016 1028   KETONESUR NEGATIVE 04/14/2016 1028   PROTEINUR NEGATIVE 04/14/2016 1028   UROBILINOGEN 2.0 (H) 10/28/2012 0820   NITRITE NEGATIVE 04/14/2016 1028   LEUKOCYTESUR NEGATIVE 04/14/2016 1028   Sepsis Labs Invalid input(s): PROCALCITONIN,  WBC,  LACTICIDVEN   Time coordinating discharge: 40 minutes  SIGNED:  Marzetta Board, MD  [facility] 07/24/2018, 2:30 PM Pager [phone]  If 7PM-7AM, please contact night-coverage [email or web address] Password [email or web address]

## 2018-07-24 NOTE — Care Management Note (Addendum)
Case Management Note  Patient Details  Name: [name] MRN: [id number] Date of Birth: 27-Jun-1931  Subjective/Objective:     From home, has care givers , but may need higher level of care. Presents with lower GIB/snycope, HTN, orhostatic hypotension, chronic chf, forehead laceration.ckd. . GI consulted, liq diet. Uses a walker at home.   Pt eval rec SNF. CSW referral. He has comfor care aide from 8am to 2 pm, then 5 pm to 7:30 pm, then goes to bed per patient.  He gets up at 5:30 am to take his dog out.  Per Daughter they will see if comfort care can come ealry and take his dog out or boad the dog for a couple of days at the [place]. He has PCP Dr. Lenna Gilford, he has prescription coverage.  PT rec SNF, patient wants to go home with [facility] services. NCM offered choice, he chose [facility] for [facility], HHPT, referral made to [facility] with [facility]. Soc will begin 24- 48 hrs post dc.               Action/Plan: DC home when ready.   Expected Discharge Date:  07/24/18               Expected Discharge Plan:  [place]  In-House Referral:  Clinical Social Work  Discharge planning Services  CM Consult  Post Acute Care Choice:  Home Health Choice offered to:  Patient  DME Arranged:    DME Agency:     HH Arranged:  RN, PT, Disease Management [place] Agency:  [place]  Status of Service:  Completed, signed off  If discussed at [place] of Stay Meetings, dates discussed:    Additional Comments:  Zenon Mayo, RN 07/24/2018, 9:13 AM

## 2018-07-25 LAB — BPAM RBC
BLOOD PRODUCT EXPIRATION DATE: 201910062359
BLOOD PRODUCT EXPIRATION DATE: 201910082359
Blood Product Expiration Date: 201910052359
Blood Product Expiration Date: 201910132359
ISSUE DATE / TIME: 201909231003
ISSUE DATE / TIME: 201909231003
ISSUE DATE / TIME: 201909300534
ISSUE DATE / TIME: 201909301209
UNIT TYPE AND RH: 6200
UNIT TYPE AND RH: 6200
Unit Type and Rh: 9500
Unit Type and Rh: 9500

## 2018-07-25 LAB — TYPE AND SCREEN
ABO/RH(D): A POS
Antibody Screen: NEGATIVE
Unit division: 0
Unit division: 0
Unit division: 0
Unit division: 0

## 2018-07-28 ENCOUNTER — Telehealth: Payer: Self-pay | Admitting: Pulmonary Disease

## 2018-07-28 DIAGNOSIS — Z79891 Long term (current) use of opiate analgesic: Secondary | ICD-10-CM | POA: Diagnosis not present

## 2018-07-28 DIAGNOSIS — K922 Gastrointestinal hemorrhage, unspecified: Secondary | ICD-10-CM | POA: Diagnosis not present

## 2018-07-28 DIAGNOSIS — Z87891 Personal history of nicotine dependence: Secondary | ICD-10-CM | POA: Diagnosis not present

## 2018-07-28 DIAGNOSIS — Z7951 Long term (current) use of inhaled steroids: Secondary | ICD-10-CM | POA: Diagnosis not present

## 2018-07-28 DIAGNOSIS — S0181XD Laceration without foreign body of other part of head, subsequent encounter: Secondary | ICD-10-CM | POA: Diagnosis not present

## 2018-07-28 DIAGNOSIS — K5791 Diverticulosis of intestine, part unspecified, without perforation or abscess with bleeding: Secondary | ICD-10-CM | POA: Diagnosis not present

## 2018-07-28 DIAGNOSIS — I739 Peripheral vascular disease, unspecified: Secondary | ICD-10-CM | POA: Diagnosis not present

## 2018-07-28 DIAGNOSIS — M1990 Unspecified osteoarthritis, unspecified site: Secondary | ICD-10-CM | POA: Diagnosis not present

## 2018-07-28 DIAGNOSIS — M810 Age-related osteoporosis without current pathological fracture: Secondary | ICD-10-CM | POA: Diagnosis not present

## 2018-07-28 DIAGNOSIS — J449 Chronic obstructive pulmonary disease, unspecified: Secondary | ICD-10-CM | POA: Diagnosis not present

## 2018-07-28 DIAGNOSIS — Z9981 Dependence on supplemental oxygen: Secondary | ICD-10-CM | POA: Diagnosis not present

## 2018-07-28 DIAGNOSIS — I130 Hypertensive heart and chronic kidney disease with heart failure and stage 1 through stage 4 chronic kidney disease, or unspecified chronic kidney disease: Secondary | ICD-10-CM | POA: Diagnosis not present

## 2018-07-28 DIAGNOSIS — I5042 Chronic combined systolic (congestive) and diastolic (congestive) heart failure: Secondary | ICD-10-CM | POA: Diagnosis not present

## 2018-07-28 DIAGNOSIS — Z8701 Personal history of pneumonia (recurrent): Secondary | ICD-10-CM | POA: Diagnosis not present

## 2018-07-28 DIAGNOSIS — Z85828 Personal history of other malignant neoplasm of skin: Secondary | ICD-10-CM | POA: Diagnosis not present

## 2018-07-28 DIAGNOSIS — N183 Chronic kidney disease, stage 3 (moderate): Secondary | ICD-10-CM | POA: Diagnosis not present

## 2018-07-28 DIAGNOSIS — I2510 Atherosclerotic heart disease of native coronary artery without angina pectoris: Secondary | ICD-10-CM | POA: Diagnosis not present

## 2018-07-28 DIAGNOSIS — Z955 Presence of coronary angioplasty implant and graft: Secondary | ICD-10-CM | POA: Diagnosis not present

## 2018-07-28 DIAGNOSIS — Z95810 Presence of automatic (implantable) cardiac defibrillator: Secondary | ICD-10-CM | POA: Diagnosis not present

## 2018-07-28 NOTE — Telephone Encounter (Signed)
Spoke with MD Lenna Gilford, verbal order given to continue [facility] for 9 week.s Called and spoke with Sharyn Lull at [facility] Verbal order given. Nothing further is needed at this time.

## 2018-07-28 NOTE — Telephone Encounter (Signed)
Called and spoke to Brandon, she is requesting a verbal order for home health for 9 weeks.   SN please advise.

## 2018-07-29 ENCOUNTER — Telehealth: Payer: Self-pay | Admitting: Pulmonary Disease

## 2018-07-29 DIAGNOSIS — S0181XD Laceration without foreign body of other part of head, subsequent encounter: Secondary | ICD-10-CM | POA: Diagnosis not present

## 2018-07-29 DIAGNOSIS — I2510 Atherosclerotic heart disease of native coronary artery without angina pectoris: Secondary | ICD-10-CM | POA: Diagnosis not present

## 2018-07-29 DIAGNOSIS — I130 Hypertensive heart and chronic kidney disease with heart failure and stage 1 through stage 4 chronic kidney disease, or unspecified chronic kidney disease: Secondary | ICD-10-CM | POA: Diagnosis not present

## 2018-07-29 DIAGNOSIS — N183 Chronic kidney disease, stage 3 (moderate): Secondary | ICD-10-CM | POA: Diagnosis not present

## 2018-07-29 DIAGNOSIS — I5042 Chronic combined systolic (congestive) and diastolic (congestive) heart failure: Secondary | ICD-10-CM | POA: Diagnosis not present

## 2018-07-29 DIAGNOSIS — C44212 Basal cell carcinoma of skin of right ear and external auricular canal: Secondary | ICD-10-CM | POA: Diagnosis not present

## 2018-07-29 DIAGNOSIS — K922 Gastrointestinal hemorrhage, unspecified: Secondary | ICD-10-CM | POA: Diagnosis not present

## 2018-07-29 NOTE — Telephone Encounter (Signed)
Multiple scrapes on both legs, currently not infected.

## 2018-07-29 NOTE — Telephone Encounter (Signed)
Attempted to call [name] with [facility] PT but unable to reach him.  Left message for [name] to return call x1

## 2018-07-30 NOTE — Telephone Encounter (Signed)
Spoke with Clair Gulling with [facility]  He was asking for ok for PT in home for this pt after recent hospital stay  Per Lattie Haw- [place] and I advised him of this   He reports the pt has several scrapes on his legs, that he reports came from his dog  They do not appear to be infected  He states pt also reports cough with yellow to brown sputum   Called pt and moved up his f/u with SN to tomorrow at 11:30

## 2018-07-30 NOTE — Telephone Encounter (Signed)
[name] with [facility] returning [phone] [name] contact number [phone]

## 2018-07-31 ENCOUNTER — Encounter: Payer: Self-pay | Admitting: Pulmonary Disease

## 2018-07-31 ENCOUNTER — Telehealth: Payer: Self-pay

## 2018-07-31 ENCOUNTER — Ambulatory Visit (INDEPENDENT_AMBULATORY_CARE_PROVIDER_SITE_OTHER): Payer: Medicare Other | Admitting: *Deleted

## 2018-07-31 ENCOUNTER — Ambulatory Visit (INDEPENDENT_AMBULATORY_CARE_PROVIDER_SITE_OTHER): Payer: Medicare Other | Admitting: Pulmonary Disease

## 2018-07-31 ENCOUNTER — Ambulatory Visit (INDEPENDENT_AMBULATORY_CARE_PROVIDER_SITE_OTHER): Payer: Medicare Other

## 2018-07-31 ENCOUNTER — Other Ambulatory Visit (INDEPENDENT_AMBULATORY_CARE_PROVIDER_SITE_OTHER): Payer: Medicare Other

## 2018-07-31 VITALS — BP 116/66 | HR 54 | Temp 98.1°F | Ht 71.0 in | Wt 178.8 lb

## 2018-07-31 DIAGNOSIS — J90 Pleural effusion, not elsewhere classified: Secondary | ICD-10-CM

## 2018-07-31 DIAGNOSIS — I739 Peripheral vascular disease, unspecified: Secondary | ICD-10-CM

## 2018-07-31 DIAGNOSIS — I255 Ischemic cardiomyopathy: Secondary | ICD-10-CM | POA: Diagnosis not present

## 2018-07-31 DIAGNOSIS — K5730 Diverticulosis of large intestine without perforation or abscess without bleeding: Secondary | ICD-10-CM | POA: Diagnosis not present

## 2018-07-31 DIAGNOSIS — M150 Primary generalized (osteo)arthritis: Secondary | ICD-10-CM | POA: Diagnosis not present

## 2018-07-31 DIAGNOSIS — Z95810 Presence of automatic (implantable) cardiac defibrillator: Secondary | ICD-10-CM

## 2018-07-31 DIAGNOSIS — I712 Thoracic aortic aneurysm, without rupture, unspecified: Secondary | ICD-10-CM

## 2018-07-31 DIAGNOSIS — I2510 Atherosclerotic heart disease of native coronary artery without angina pectoris: Secondary | ICD-10-CM | POA: Diagnosis not present

## 2018-07-31 DIAGNOSIS — Z9861 Coronary angioplasty status: Secondary | ICD-10-CM

## 2018-07-31 DIAGNOSIS — Z8701 Personal history of pneumonia (recurrent): Secondary | ICD-10-CM | POA: Diagnosis not present

## 2018-07-31 DIAGNOSIS — K922 Gastrointestinal hemorrhage, unspecified: Secondary | ICD-10-CM

## 2018-07-31 DIAGNOSIS — M159 Polyosteoarthritis, unspecified: Secondary | ICD-10-CM

## 2018-07-31 DIAGNOSIS — I5042 Chronic combined systolic (congestive) and diastolic (congestive) heart failure: Secondary | ICD-10-CM | POA: Diagnosis not present

## 2018-07-31 DIAGNOSIS — W19XXXD Unspecified fall, subsequent encounter: Secondary | ICD-10-CM

## 2018-07-31 DIAGNOSIS — J449 Chronic obstructive pulmonary disease, unspecified: Secondary | ICD-10-CM

## 2018-07-31 DIAGNOSIS — Z23 Encounter for immunization: Secondary | ICD-10-CM

## 2018-07-31 DIAGNOSIS — R5381 Other malaise: Secondary | ICD-10-CM

## 2018-07-31 LAB — CBC WITH DIFFERENTIAL/PLATELET
BASOS PCT: 0.4 % (ref 0.0–3.0)
Basophils Absolute: 0 10*3/uL (ref 0.0–0.1)
EOS ABS: 0.7 10*3/uL (ref 0.0–0.7)
Eosinophils Relative: 6.9 % — ABNORMAL HIGH (ref 0.0–5.0)
HEMATOCRIT: 29.9 % — AB (ref 39.0–52.0)
Hemoglobin: 10.1 g/dL — ABNORMAL LOW (ref 13.0–17.0)
LYMPHS PCT: 10.7 % — AB (ref 12.0–46.0)
Lymphs Abs: 1.1 10*3/uL (ref 0.7–4.0)
MCHC: 33.7 g/dL (ref 30.0–36.0)
MCV: 97.1 fl (ref 78.0–100.0)
MONOS PCT: 6.3 % (ref 3.0–12.0)
Monocytes Absolute: 0.7 10*3/uL (ref 0.1–1.0)
Neutro Abs: 7.8 10*3/uL — ABNORMAL HIGH (ref 1.4–7.7)
Neutrophils Relative %: 75.7 % (ref 43.0–77.0)
Platelets: 184 10*3/uL (ref 150.0–400.0)
RBC: 3.08 Mil/uL — ABNORMAL LOW (ref 4.22–5.81)
RDW: 15.4 % (ref 11.5–15.5)
WBC: 10.3 10*3/uL (ref 4.0–10.5)

## 2018-07-31 NOTE — Progress Notes (Signed)
Remote ICD transmission.   
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
## 2018-07-31 NOTE — Progress Notes (Signed)
EPIC Encounter for ICM Monitoring  Patient Name: [name] is a 84 y.o. male Date: 07/31/2018 Primary Care Physican: Noralee Space, MD Primary Cardiologist: Hochrein Electrophysiologist: Faustino Congress Weight:unknown  Call todaughter [name] and unable to reach.  Left detailed message, per DPR, regarding transmission.  Transmission reviewed.    Thoracic impedance abnormal suggesting fluid accumulation starting 07/17/2018.   Prescribed: Furosemide20 mg 1 tabletMonday, Wednesday and Friday.Potassium 20 mEq 1 tablet every Monday, Wednesday and Friday.   Labs: 07/24/2018 Creatinine 1.03, BUN 17, Potassium 3.5, Sodium 133, eGFR >60 07/23/2018 Creatinine 1.10, BUN 20, Potassium 4.1, Sodium 134, eGFR 59->60  07/22/2018 Creatinine 1.09, BUN 28, Potassium 4.1, Sodium 135, eGFR >60  07/21/2018 Creatinine 1.40, BUN 30, Potassium 4.4, Sodium 140,    5:42 AM  07/21/2018 Creatinine 1.36, BUN 28, Potassium 4.4, Sodium 138, eGFR 46-53  02/13/2018 Creatinine1.15, BUN24, Potassium5.1, Sodium138, [id number] 02/10/2018 Creatinine0.98, BUN23, Potassium4.0, Sodium134, EGFR>60 12/16/2017 Creatinine 0.90, BUN 17, Potassium 4.4, Sodium 138 11/13/2017 Creatinine 0.92, BUN 16, Potassium 4.3, Sodium 136, EGFR 76-88 10/30/2017 Creatinine 1.06, BUN 14, Potassium 4.6, Sodium 134, EGFR 64-74 A complete set of results can be found in Results Review.  Recommendations:  Left voice mail with ICM number and encouraged to call if experiencing any fluid symptoms.  Follow-up plan: ICM clinic phone appointment on 08/07/2018 to recheck fluid levels.   Office appointment scheduled 09/08/2018 with Dr. Percival Spanish.    Copy of ICM check sent to Dr. Caryl Comes and Dr Percival Spanish for review and recommendations if needed.   3 month ICM trend: 07/31/2018    1 Year ICM trend:       [name], RN 07/31/2018 3:06 PM

## 2018-07-31 NOTE — Telephone Encounter (Signed)
Remote ICM transmission received.  Attempted call to [name] and left detailed message, per DPR, regarding transmission and next ICM scheduled for 08/07/2018.  Advised to return call for any fluid symptoms or questions.

## 2018-07-31 NOTE — Patient Instructions (Signed)
Today we updated your med list in our EPIC system...    Continue your current medications the same...  Today we removed the staples from your scalp & we cleaned the wound w/ some peroxide & water...    Keep the area clean & the scabs will gradually resolve...  We dressed the left ear lesion w/ dry gauze & some paper tape, hopefully the Dermatologist will be able to get you worked in sooner, rather than waiting until December...  Today we checked your follow up blood count...    We will contact you w/ the results when available...   We gave you the 2019 FLU vaccine today...  Let's plan a return visit in about 2 wks for recheck.Marland KitchenMarland Kitchen

## 2018-08-01 ENCOUNTER — Other Ambulatory Visit: Payer: Self-pay | Admitting: Pulmonary Disease

## 2018-08-03 ENCOUNTER — Other Ambulatory Visit: Payer: Self-pay | Admitting: Pulmonary Disease

## 2018-08-04 ENCOUNTER — Ambulatory Visit (INDEPENDENT_AMBULATORY_CARE_PROVIDER_SITE_OTHER): Payer: Medicare Other | Admitting: Nurse Practitioner

## 2018-08-04 ENCOUNTER — Ambulatory Visit (INDEPENDENT_AMBULATORY_CARE_PROVIDER_SITE_OTHER)
Admission: RE | Admit: 2018-08-04 | Discharge: 2018-08-04 | Disposition: A | Discharge status: Home / Self Care | Payer: Medicare Other | Source: Ambulatory Visit | Attending: Nurse Practitioner | Admitting: Nurse Practitioner

## 2018-08-04 ENCOUNTER — Encounter: Payer: Self-pay | Admitting: Pulmonary Disease

## 2018-08-04 ENCOUNTER — Other Ambulatory Visit: Payer: Medicare Other

## 2018-08-04 ENCOUNTER — Telehealth: Payer: Self-pay | Admitting: Pulmonary Disease

## 2018-08-04 ENCOUNTER — Encounter: Payer: Self-pay | Admitting: Nurse Practitioner

## 2018-08-04 VITALS — BP 118/62 | HR 91 | Ht 71.0 in | Wt 175.8 lb

## 2018-08-04 DIAGNOSIS — J449 Chronic obstructive pulmonary disease, unspecified: Secondary | ICD-10-CM

## 2018-08-04 DIAGNOSIS — K922 Gastrointestinal hemorrhage, unspecified: Secondary | ICD-10-CM | POA: Diagnosis not present

## 2018-08-04 DIAGNOSIS — I2510 Atherosclerotic heart disease of native coronary artery without angina pectoris: Secondary | ICD-10-CM | POA: Diagnosis not present

## 2018-08-04 DIAGNOSIS — S0181XD Laceration without foreign body of other part of head, subsequent encounter: Secondary | ICD-10-CM | POA: Diagnosis not present

## 2018-08-04 DIAGNOSIS — N183 Chronic kidney disease, stage 3 (moderate): Secondary | ICD-10-CM | POA: Diagnosis not present

## 2018-08-04 DIAGNOSIS — I5042 Chronic combined systolic (congestive) and diastolic (congestive) heart failure: Secondary | ICD-10-CM | POA: Diagnosis not present

## 2018-08-04 DIAGNOSIS — I130 Hypertensive heart and chronic kidney disease with heart failure and stage 1 through stage 4 chronic kidney disease, or unspecified chronic kidney disease: Secondary | ICD-10-CM | POA: Diagnosis not present

## 2018-08-04 DIAGNOSIS — R0602 Shortness of breath: Secondary | ICD-10-CM

## 2018-08-04 DIAGNOSIS — J181 Lobar pneumonia, unspecified organism: Secondary | ICD-10-CM | POA: Insufficient documentation

## 2018-08-04 MED ORDER — DOXYCYCLINE HYCLATE 100 MG PO TABS: 100.0000 mg | ORAL_TABLET | Freq: Two times a day (BID) | ORAL | 0 refills | Status: DC

## 2018-08-04 NOTE — Telephone Encounter (Signed)
Called spoke with patient's daughter. She states her dad is more SOB and has increased over the weekend. She states she received a report Friday from patient's pacemaker that patient is starting to hold fluid.  Patient states it is harder when she lays flat so he hasn't been sleeping well.  Daughter states this happened around this time last year and patient ended up with pna and in the hospital.  Appointment scheduled with Lazaro Arms today at 71.  Will route to [place] as [employer]

## 2018-08-04 NOTE — Assessment & Plan Note (Signed)
Patient Instructions  Will order doxycycline - chest x ray today shows right upper and middle lobe pneumonia Please take an extra lasix 20 mg tomorrow  (peripheral edema) mucinex as needed Will order sputum cultures Stay well hydrated Please follow up with Dr. Lenna Gilford in 1 week or sooner if needed

## 2018-08-04 NOTE — Patient Instructions (Addendum)
Will order doxycycline - chest x ray today shows right upper and middle lobe pneumonia Please take an extra lasix 20 mg tomorrow  (peripheral edema) mucinex as needed Will order sputum cultures Stay well hydrated Please follow up with Dr. Lenna Gilford in 1 week or sooner if needed

## 2018-08-04 NOTE — Progress Notes (Signed)
@Patient  ID: [name], male    DOB: [birth year], 84 y.o.   MRN: [id number]  Chief Complaint  Patient presents with  . Shortness of Breath    Referring provider: Noralee Space, MD  HPI  84 year old male former smoker with COPD GOLD III, chronic respiratory failure followed by Dr. Lenna Gilford.  Health history includes AICD placement for NICM, PVD, HTN, HLD, CHF, CAD. Recent history of GI bleed.   Tests: CTA 07/21/18 - rounded airspace opacity posteriorly in the right lower lobe could reflect rounded pneumonia or atelectasis.   OV 08/04/18 - acute shortness of breath Patient presents today with shortness of breath and productive cough. States that cough has been productive of thick yellow sputum. Symptoms started 3-4 days ago. Symptoms are progressively worsening. His daughter made the appointment today and stated that she received a report on 08/01/18 from patient's pacemaker that stated patient is starting to hold fluid. She states that he had pneumonia the last she got a report like this. He states that he is having some peripheral edema. He denies any fever, chest pain, or wheezing.     Allergies  Allergen Reactions  . Lisinopril     BP dropped too low per daughter    Immunization History  Administered Date(s) Administered  . H1N1 10/26/2008  . Influenza Split 08/23/2011, 07/15/2012  . Influenza Whole 07/14/2008, 08/22/2009, 09/04/2010  . Influenza, High Dose Seasonal PF 07/09/2016, 09/18/2017, 07/31/2018  . Influenza,inj,Quad PF,6+ Mos 07/07/2013, 07/07/2014, 07/23/2015  . Pneumococcal Conjugate-13 10/03/2015  . Pneumococcal Polysaccharide-23 07/12/2009  . Tdap 02/10/2018    Past Medical History:  Diagnosis Date  . AAA (abdominal aortic aneurysm) ([place])    a. 2002 s/p repair.  . Allergic rhinitis   . Back pain   . CAD (coronary artery disease)    a. 02/2002 H/o MI with stenting x 2; b. 04/2013 MV: EF 25%, large posterior lateral infarct w/o ischemia; c. 03/2016 VT  Arrest/Cath: LM 60ost, LAD 40p/m ISR, LCX 100p/m, RCA nl-->Med Rx.  . Carotid arterial disease ([place])    a. 12/2010 s/p R CEA;  b. 05/2015 Carotid U/S: bilat <40% ICA stenosis.  . Chronic combined systolic and diastolic CHF (congestive heart failure) ([place])    a. 03/2016 Echo: Ef 35-40%, grade 1 DD.  Marland Kitchen Compression fracture   . COPD (chronic obstructive pulmonary disease) ([place])   . Dermatitis   . Diverticulosis of colon   . DJD (degenerative joint disease)    and Gout  . Hemorrhoids   . History of colonic polyps   . History of gout   . History of pneumonia   . Hypercholesterolemia   . Hypertension   . Hypertensive heart disease   . Ischemic cardiomyopathy    a. EF prev <35%-->improved to normal by Echo 8/14:  Mild LVH, focal basal hypertrophy, EF 60-65%, normal wall motion, mild BAE, PASP 36; c. 03/2016 Echo: EF 35-40%, Gr1 DD, triv AI, mild MR, mod dil LA, mild-mod TR, PASP 85mmHg.  . Osteoporosis   . Peripheral vascular disease ([place])   . Presence of cardiac defibrillator    a. 03/2008 s/p MDT [id number] Maximo II DR, DC AICD; b. 03/2013: ICD shock for T wave oversensing;  c. 10/2015: collective decision not to replace ICD given improvement in LV fxn; d. VT Arrest-->Gen change to MDT ser # [id number].  . Skin cancer    shoulders and forehead  . Syncope   . T wave over sensing resulting in inappropriate shocks  a. 03/2013.  . Tobacco abuse   . Ventricular tachycardia ([place]) 04/01/2016    Tobacco History: Social History   Tobacco Use  Smoking Status Former Smoker  . Packs/day: 1.50  . Years: 65.00  . Pack years: 97.50  . Types: Cigarettes, Cigars, Pipe  . Last attempt to quit: 03/13/2008  . Years since quitting: 10.4  Smokeless Tobacco Never Used  Tobacco Comment   quit about 10 years ago   Counseling given: Yes Comment: quit about 10 years ago   Outpatient Encounter Medications as of 08/04/2018  Medication Sig  . albuterol (PROVENTIL HFA;VENTOLIN HFA) 108 (90 Base) MCG/ACT  inhaler Inhale 2 puffs into the lungs every 6 (six) hours as needed for wheezing or shortness of breath.  Marland Kitchen alendronate (FOSAMAX) 70 MG tablet Take 1 tablet (70 mg total) by mouth once a week. Take with a full glass of water on an empty stomach.  Marland Kitchen allopurinol (ZYLOPRIM) 100 MG tablet TAKE 1 TABLET ONCE DAILY. (Patient taking differently: Take 100 mg by mouth daily. )  . amiodarone (PACERONE) 200 MG tablet TAKE 1 TABLET ONCE DAILY.  . carvedilol (COREG) 3.125 MG tablet TAKE 1 TABLET BY MOUTH TWICE DAILY WITH A MEAL. (Patient taking differently: Take 3.125 mg by mouth See admin instructions. Takes if the systolic the blood pressure is over 100)  . cholecalciferol (VITAMIN D) 1000 units tablet Take 1,000 Units by mouth daily.  . fluticasone (CUTIVATE) 0.05 % cream Apply 1 application topically 2 (two) times daily.   . fluticasone-salmeterol (ADVAIR HFA) 115-21 MCG/ACT inhaler Inhale 2 puffs into the lungs 2 (two) times daily.  . furosemide (LASIX) 20 MG tablet Take 1 tablet Monday, Wednesday and Friday (Patient taking differently: Take 20 mg by mouth every Monday, Wednesday, and Friday. )  . guaiFENesin (MUCINEX) 600 MG 12 hr tablet Take 600 mg by mouth 2 (two) times daily.   Marland Kitchen levothyroxine (SYNTHROID, LEVOTHROID) 50 MCG tablet Take 50 mcg by mouth daily before breakfast.  . magnesium oxide (MAG-OX) 400 (241.3 Mg) MG tablet Take 0.5 tablets (200 mg total) by mouth daily.  . midodrine (PROAMATINE) 2.5 MG tablet TAKE 1 TABLET 3 TIMES DAILY WITH MEALS. (Patient taking differently: Take 2.5 mg by mouth 3 (three) times daily with meals. )  . Multiple Vitamin (MULTIVITAMIN) capsule Take 1 capsule by mouth daily.  . Multiple Vitamins-Minerals (PRESERVISION AREDS 2 PO) Take 1 capsule by mouth daily.  Marland Kitchen oxybutynin (DITROPAN) 5 MG tablet TAKE 1 TABLET BY MOUTH TWICE DAILY.  Marland Kitchen polyethylene glycol powder (QC NATURA-LAX) powder 1 capful in 8oz water twice daily (Patient taking differently: 1 capful in [place] water as  needed)  . potassium chloride (K-DUR) 10 MEQ tablet Take 1 tablet Monday, Wednesday and Friday (Patient taking differently: Take 10 mEq by mouth every Monday, Wednesday, and Friday. )  . pravastatin (PRAVACHOL) 40 MG tablet TAKE 1 TABLET ONCE DAILY.  Marland Kitchen sertraline (ZOLOFT) 50 MG tablet TAKE ONE TABLET AT BEDTIME. (Patient taking differently: Take 50 mg by mouth at bedtime. )  . thiamine 100 MG tablet Take 1 tablet (100 mg total) by mouth daily.  . traMADol (ULTRAM) 50 MG tablet Take 1 tablet (50 mg total) by mouth every 6 (six) hours as needed for severe pain.  Marland Kitchen doxycycline (VIBRA-TABS) 100 MG tablet Take 1 tablet (100 mg total) by mouth 2 (two) times daily.   No facility-administered encounter medications on file as of 08/04/2018.      Review of Systems  Review of Systems  Constitutional: Negative.  Negative for appetite change, chills and fever.  HENT: Negative.  Negative for congestion.   Respiratory: Positive for cough and shortness of breath. Negative for wheezing.   Cardiovascular: Positive for leg swelling. Negative for chest pain and palpitations.  Gastrointestinal: Negative.   Allergic/Immunologic: Negative.   Neurological: Negative.   Psychiatric/Behavioral: Negative.        Physical Exam  BP 118/62 (BP Location: Right Arm, Patient Position: Sitting, Cuff Size: Normal)   Pulse 91   Ht 5\' 11"  (1.803 m)   Wt 175 lb 12.8 oz (79.7 kg)   SpO2 97% Comment: repeated SPO2 after rest  BMI 24.52 kg/m   Wt Readings from Last 5 Encounters:  08/04/18 175 lb 12.8 oz (79.7 kg)  07/31/18 178 lb 12.8 oz (81.1 kg)  07/21/18 178 lb (80.7 kg)  07/15/18 178 lb (80.7 kg)  06/10/18 176 lb (79.8 kg)     Physical Exam  Constitutional: He is oriented to person, place, and time. He appears well-developed and well-nourished. No distress.  Cardiovascular: Normal rate and regular rhythm.  Pulmonary/Chest: Effort normal and breath sounds normal. He has no wheezes. He has no rhonchi. He  has no rales.  Musculoskeletal:       Left lower leg: He exhibits edema (+1 edema bilateral).  Neurological: He is alert and oriented to person, place, and time.  Skin: Skin is warm and dry.  Psychiatric: He has a normal mood and affect.  Nursing note and vitals reviewed.    Imaging: Dg Chest 2 View  Result Date: 08/04/2018 CLINICAL DATA:  Three day history of dyspnea. History of CHF, coronary artery disease, COPD, former smoker. EXAM: CHEST - 2 VIEW COMPARISON:  PA and lateral chest x-ray dated February 10, 2018 and chest CT scan of July 21, 2018. FINDINGS: The lungs are mildly hyperinflated. There small bilateral pleural effusions. The interstitial markings are coarse bilaterally. There is hazy increased density in the right upper lobe and to a lesser extent inferior medially in the right middle lobe. The heart is top-normal in size. The pulmonary vascularity is slightly more conspicuous today. There is calcification in the wall of the thoracic aorta. The ICD is in stable position. IMPRESSION: COPD. Probable superimposed interstitial pneumonia in the right upper and middle lobes. New small bilateral pleural effusions. Stable mild cardiac enlargement with minimal prominence of the central pulmonary vascularity. Followup PA and lateral chest X-ray is recommended in 3-4 weeks following trial of antibiotic therapy to ensure resolution and exclude underlying malignancy. Thoracic aortic atherosclerosis. Electronically Signed   By: David  Martinique M.D.   On: 08/04/2018 11:23   Ct Head Wo Contrast  Result Date: 07/21/2018 CLINICAL DATA:  Pain following fall EXAM: CT HEAD WITHOUT CONTRAST CT CERVICAL SPINE WITHOUT CONTRAST TECHNIQUE: Multidetector CT imaging of the head and cervical spine was performed following the standard protocol without intravenous contrast. Multiplanar CT image reconstructions of the cervical spine were also generated. CT HEAD FINDINGS Brain: There is moderate diffuse atrophy. There  is no intracranial mass, hemorrhage, extra-axial fluid collection, or midline shift. There is decreased attenuation throughout much of the centra semiovale bilaterally, felt to represent supratentorial small vessel disease. There is evidence of a prior small infarct in the head of the caudate nucleus on the right. No acute appearing infarct is evident. Vascular: No hyperdense vessels are evident. There is calcification in each distal vertebral artery as well as in the basilar artery and both carotid siphon regions. Skull: There are skin staples  overlying the superior most aspect of the scalp posteriorly. There is mild hematoma in this area. The bony calvarium appears intact. Minimal soft tissue air is noted in the scalp over the left frontal bone. Sinuses/Orbits: There is opacification of the right maxillary antrum as well as multiple right-sided ethmoid air cells. There is also opacification in the right frontal sinus region. There is evidence of previous antrostomies bilaterally. Orbits appear symmetric bilaterally. Other: Mastoid air cells are clear. CT CERVICAL SPINE FINDINGS Alignment: There is no appreciable spondylolisthesis. Skull base and vertebrae: The skull base and craniocervical junction regions appear normal. Bones are osteoporotic. No fracture evident. There are no blastic or lytic bone lesions. Soft tissues and spinal canal: Prevertebral soft tissues and predental space regions are within normal limits. No paraspinous lesions. No cord canal hematoma. Disc levels: There is severe disc space narrowing at C5-6, C6-7, and C7-T1. There is multilevel facet osteoarthritic change. There is exit foraminal narrowing due to bony hypertrophy at C2-3 on the left, at C3-4 on the left, at C4-5 on the left, to a lesser extent at C5-6 on the left, and C6-7 on the left. There is marked impression on the exiting nerve root at C3-4 and C4-5 on the left due to bony hypertrophy. No disc extrusion or stenosis. Upper chest:  Visualized upper lung zones are clear. Other: There is calcification in each subclavian and carotid artery. IMPRESSION: CT head: Atrophy with supratentorial small vessel disease. Prior small infarct in the head of the caudate nucleus on the right. No mass or hemorrhage. No acute infarct. There are foci of arterial vascular calcification. There are skin staples over the skull vertex. No fracture evident. Extensive paranasal sinus disease at multiple sites on the right side. CT cervical spine: No fracture or appreciable spondylolisthesis. Extensive multifocal arthropathy. Exit foraminal narrowing noted at multiple levels on the left, most severe at C3-4 and C4-5. There is calcification in both subclavian and carotid arteries. Electronically Signed   By: Lowella Grip III M.D.   On: 07/21/2018 07:26   Ct Cervical Spine Wo Contrast  Result Date: 07/21/2018 CLINICAL DATA:  Pain following fall EXAM: CT HEAD WITHOUT CONTRAST CT CERVICAL SPINE WITHOUT CONTRAST TECHNIQUE: Multidetector CT imaging of the head and cervical spine was performed following the standard protocol without intravenous contrast. Multiplanar CT image reconstructions of the cervical spine were also generated. CT HEAD FINDINGS Brain: There is moderate diffuse atrophy. There is no intracranial mass, hemorrhage, extra-axial fluid collection, or midline shift. There is decreased attenuation throughout much of the centra semiovale bilaterally, felt to represent supratentorial small vessel disease. There is evidence of a prior small infarct in the head of the caudate nucleus on the right. No acute appearing infarct is evident. Vascular: No hyperdense vessels are evident. There is calcification in each distal vertebral artery as well as in the basilar artery and both carotid siphon regions. Skull: There are skin staples overlying the superior most aspect of the scalp posteriorly. There is mild hematoma in this area. The bony calvarium appears intact.  Minimal soft tissue air is noted in the scalp over the left frontal bone. Sinuses/Orbits: There is opacification of the right maxillary antrum as well as multiple right-sided ethmoid air cells. There is also opacification in the right frontal sinus region. There is evidence of previous antrostomies bilaterally. Orbits appear symmetric bilaterally. Other: Mastoid air cells are clear. CT CERVICAL SPINE FINDINGS Alignment: There is no appreciable spondylolisthesis. Skull base and vertebrae: The skull base and craniocervical junction  regions appear normal. Bones are osteoporotic. No fracture evident. There are no blastic or lytic bone lesions. Soft tissues and spinal canal: Prevertebral soft tissues and predental space regions are within normal limits. No paraspinous lesions. No cord canal hematoma. Disc levels: There is severe disc space narrowing at C5-6, C6-7, and C7-T1. There is multilevel facet osteoarthritic change. There is exit foraminal narrowing due to bony hypertrophy at C2-3 on the left, at C3-4 on the left, at C4-5 on the left, to a lesser extent at C5-6 on the left, and C6-7 on the left. There is marked impression on the exiting nerve root at C3-4 and C4-5 on the left due to bony hypertrophy. No disc extrusion or stenosis. Upper chest: Visualized upper lung zones are clear. Other: There is calcification in each subclavian and carotid artery. IMPRESSION: CT head: Atrophy with supratentorial small vessel disease. Prior small infarct in the head of the caudate nucleus on the right. No mass or hemorrhage. No acute infarct. There are foci of arterial vascular calcification. There are skin staples over the skull vertex. No fracture evident. Extensive paranasal sinus disease at multiple sites on the right side. CT cervical spine: No fracture or appreciable spondylolisthesis. Extensive multifocal arthropathy. Exit foraminal narrowing noted at multiple levels on the left, most severe at C3-4 and C4-5. There is  calcification in both subclavian and carotid arteries. Electronically Signed   By: Lowella Grip III M.D.   On: 07/21/2018 07:26   Nm Gi Blood Loss  Result Date: 07/21/2018 CLINICAL DATA:  GI bleeding. Syncopal spell in the bathroom. Bright red blood per rectum. EXAM: NUCLEAR MEDICINE GASTROINTESTINAL BLEEDING SCAN TECHNIQUE: Sequential abdominal images were obtained following intravenous administration of Tc-81m labeled red blood cells. RADIOPHARMACEUTICALS:  27.3 mCi Tc-68m pertechnetate in-vitro labeled red cells. COMPARISON:  CT of the chest on 07/21/2018 FINDINGS: Expected blood pool activity identified. The study is carried out to 120 minutes. There is no source of bleeding detected during the exam. IMPRESSION: Study is negative for the site of GI bleeding. Electronically Signed   By: Nolon Nations M.D.   On: 07/21/2018 21:23   Ct Angio Chest/abd/pel For Dissection W And/or Wo Contrast  Result Date: 07/21/2018 CLINICAL DATA:  GI bleed.  Fall. EXAM: CT ANGIOGRAPHY CHEST, ABDOMEN AND PELVIS TECHNIQUE: Multidetector CT imaging through the chest, abdomen and pelvis was performed using the standard protocol during bolus administration of intravenous contrast. Multiplanar reconstructed images and MIPs were obtained and reviewed to evaluate the vascular anatomy. CONTRAST:  91mL ISOVUE-370 IOPAMIDOL (ISOVUE-370) INJECTION 76% COMPARISON:  None. FINDINGS: CTA CHEST FINDINGS Cardiovascular: Severe diffuse aortic atherosclerosis. Irregular calcified and noncalcified plaque noted throughout the descending thoracic aorta. Probable penetrating ulcer noted along the posterior wall of the distal descending thoracic aorta. Cardiomegaly. Diffuse coronary artery calcifications. Pacer wires present in the right heart. No evidence of aortic dissection. Mediastinum/Nodes: No mediastinal, hilar, or axillary adenopathy. Lungs/Pleura: Mild emphysema. Biapical scarring. Rounded airspace opacity noted at the right lung  base posteriorly could reflect rounded pneumonia or atelectasis. No confluent opacity on the left. Musculoskeletal: No acute bony abnormality. Prior vertebroplasty at T12. Old right rib fractures noted. Review of the MIP images confirms the above findings. CTA ABDOMEN AND PELVIS FINDINGS VASCULAR Aorta: Irregular calcified and noncalcified plaque throughout the aorta. No evidence of aneurysm or dissection. Celiac: Widely patent, calcified. SMA: Widely patent, calcified Renals: Widely patent, calcified. IMA: Patent Inflow: Calcified iliac vessels. No aneurysm or dissection. 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 Veins: Grossly unremarkable. Review of the MIP images confirms the above findings. NON-VASCULAR Hepatobiliary: No focal hepatic abnormality. Gallbladder unremarkable. Pancreas: No focal abnormality or ductal dilatation. Spleen: No focal abnormality.  Normal size. Adrenals/Urinary Tract: Area of decreased enhancement noted posteriorly in the left kidney could reflect area of infarct. No hydronephrosis or suspicious renal mass. Adrenal glands and urinary bladder unremarkable. Stomach/Bowel: Colonic diverticulosis. No active diverticulitis. Stomach and small bowel decompressed, unremarkable. Large stool burden in the rectosigmoid colon. Lymphatic: No adenopathy Reproductive: Central calcifications within the prostate. Other: No free fluid or free air. Musculoskeletal: Severe compression deformities noted at L2 and L3, likely chronic. Prior vertebroplasty at T12. Diffuse degenerative changes. No acute bony abnormality. Review of the MIP images confirms the above findings. IMPRESSION: Diffuse irregular aortic atherosclerotic plaque. Possible ulcerative plaque in the distal descending thoracic aorta posteriorly. No evidence of aneurysm or dissection. Cardiomegaly, coronary artery disease. Rounded airspace opacity posteriorly in the right lower lobe could reflect rounded pneumonia or  atelectasis. Area of decreased enhancement posteriorly in the left kidney suspicious for renal infarct. Sigmoid diverticulosis. Moderate stool burden in the rectosigmoid colon. Chronic appearing compression deformities at L2 and L3. Prior vertebroplasty at T12. Electronically Signed   By: Rolm Baptise M.D.   On: 07/21/2018 07:40     Assessment & Plan:   Lobar pneumonia, unspecified organism [facility]) Patient Instructions  Will order doxycycline - chest x ray today shows right upper and middle lobe pneumonia Please take an extra lasix 20 mg tomorrow  (peripheral edema) mucinex as needed Will order sputum cultures Stay well hydrated Please follow up with Dr. Lenna Gilford in 1 week or sooner if needed     COPD GOLD III Patient Instructions  Will order doxycycline - chest x ray today shows right upper and middle lobe pneumonia Please take an extra lasix 20 mg tomorrow  (peripheral edema) mucinex as needed Will order sputum cultures Stay well hydrated Please follow up with Dr. Lenna Gilford in 1 week or sooner if needed        Fenton Foy, NP 08/04/2018

## 2018-08-04 NOTE — Progress Notes (Signed)
Cardiology Office Note:    Date:  08/05/2018   ID:  [name], DOB 1930/11/29, MRN [id number]  PCP:  Noralee Space, MD  Cardiologist:  Minus Breeding, MD   Referring MD: Noralee Space, MD   Chief Complaint  Patient presents with  . Follow-up  . Edema    Feet and ankles.  . Headache  . Shortness of Breath    History of Present Illness:    [name] is a 84 y.o. male with a hx of HTN, carotid artery disease, PVD, AAA s/p repair (2002), chronic combined systolic and diastolic heart failure, ischemic cardiomyopathy s/p ICD in 2009. EF was improved in 10/2015 and upgrade was deferred. Unfortunately, he had a VT/VF arrest while driving and suffered a SDH (his wife also died in hospice on this day). Generator was then replaced, and maintained on amiodarone. Heart cath at that time with occluded Cx with recommendations for medical therapy. Echo 2018 with EF of 30-35% and grade 2 DD. He had problems with orthostatic hypotension and falls - he began holding his coreg if pressure was low. He was last seen in clinic 06/10/18 and was doing well at that time. He has chronic SOB related to COPD. He was recently hospitalized with rectal bleeding and syncope/fall, discharged on 07/24/18.   He presents today for hospital follow up.  He states that he has experienced orthopnea over the past 1 to 2 weeks and has had an increase in lower extremity edema over the past week.  He was seen by his PCP yesterday and diagnosed with pneumonia and started on doxycycline.  His PCP also increased his Lasix to 4 times a week.  His main complaint today is shortness of breath and trouble breathing when he goes to sleep at night.  He is wearing compression socks on his lower extremities.  He denies chest pain and further issues with syncope, although he continues to be dizzy from time to time.   Past Medical History:  Diagnosis Date  . AAA (abdominal aortic aneurysm) ([place])    a. 2002 s/p repair.  . Allergic rhinitis    . Back pain   . CAD (coronary artery disease)    a. 02/2002 H/o MI with stenting x 2; b. 04/2013 MV: EF 25%, large posterior lateral infarct w/o ischemia; c. 03/2016 VT Arrest/Cath: LM 60ost, LAD 40p/m ISR, LCX 100p/m, RCA nl-->Med Rx.  . Carotid arterial disease ([place])    a. 12/2010 s/p R CEA;  b. 05/2015 Carotid U/S: bilat <40% ICA stenosis.  . Chronic combined systolic and diastolic CHF (congestive heart failure) ([place])    a. 03/2016 Echo: Ef 35-40%, grade 1 DD.  Marland Kitchen Compression fracture   . COPD (chronic obstructive pulmonary disease) ([place])   . Dermatitis   . Diverticulosis of colon   . DJD (degenerative joint disease)    and Gout  . Hemorrhoids   . History of colonic polyps   . History of gout   . History of pneumonia   . Hypercholesterolemia   . Hypertension   . Hypertensive heart disease   . Ischemic cardiomyopathy    a. EF prev <35%-->improved to normal by Echo 8/14:  Mild LVH, focal basal hypertrophy, EF 60-65%, normal wall motion, mild BAE, PASP 36; c. 03/2016 Echo: EF 35-40%, Gr1 DD, triv AI, mild MR, mod dil LA, mild-mod TR, PASP 67mmHg.  . Osteoporosis   . Peripheral vascular disease ([place])   . Presence of cardiac defibrillator  a. 03/2008 s/p MDT [id number] Maximo II DR, DC AICD; b. 03/2013: ICD shock for T wave oversensing;  c. 10/2015: collective decision not to replace ICD given improvement in LV fxn; d. VT Arrest-->Gen change to MDT ser # [id number].  . Skin cancer    shoulders and forehead  . Syncope   . T wave over sensing resulting in inappropriate shocks    a. 03/2013.  . Tobacco abuse   . Ventricular tachycardia ([place]) 04/01/2016    Past Surgical History:  Procedure Laterality Date  . ABDOMINAL AORTIC ANEURYSM REPAIR  2002   by Dr. Kellie Simmering  . aicd placed  03/2008   Dr. Caryl Comes  . cad stent  02/2002   Dr. Percival Spanish  . CARDIAC CATHETERIZATION     X 2 stents  . CARDIAC CATHETERIZATION N/A 04/03/2016   Procedure: Left Heart Cath and Coronary Angiography;  Surgeon: Belva Crome, MD;  Location: [place] [facility];  Service: Cardiovascular;  Laterality: N/A;  . CARDIAC DEFIBRILLATOR PLACEMENT  2009  . CARDIAC DEFIBRILLATOR PLACEMENT    . CAROTID ENDARTERECTOMY Right January 02, 2011  . EAR CYST EXCISION Left 12/13/2015   Procedure: Excision left ear lesion ;  Surgeon: Melissa Montane, MD;  Location: [place];  Service: ENT;  Laterality: Left;  . EP IMPLANTABLE DEVICE N/A 04/06/2016   Procedure:  ICD Generator Changeout;  Surgeon: Deboraha Sprang, MD;  Location: [place] [facility];  Service: Cardiovascular;  Laterality: N/A;  . EXCISION OF LESION LEFT EAR Left 12/13/2015  . I&D EXTREMITY Left 04/23/2016   Procedure: IRRIGATION AND DEBRIDEMENT HAND;  Surgeon: Leandrew Koyanagi, MD;  Location: [place];  Service: Orthopedics;  Laterality: Left;  . IR IMAGE GUIDED DRAINAGE BY PERCUTANEOUS CATHETER  10/08/2017  . OPEN REDUCTION INTERNAL FIXATION (ORIF) METACARPAL Left 04/04/2016   Procedure: OPEN REDUCTION INTERNAL FIXATION (ORIF) LEFT 2ND, 3RD, 4TH METACARPAL FRACTURE;  Surgeon: Leandrew Koyanagi, MD;  Location: [place];  Service: Orthopedics;  Laterality: Left;  OPEN REDUCTION INTERNAL FIXATION (ORIF) LEFT 2ND, 3RD, 4TH METACARPAL FRACTURE  . OPEN REDUCTION INTERNAL FIXATION (ORIF) METACARPAL Left 04/23/2016   Procedure: REVISION OPEN REDUCTION INTERNAL FIXATION (ORIF) 2ND METACARPAL;  Surgeon: Leandrew Koyanagi, MD;  Location: [place];  Service: Orthopedics;  Laterality: Left;  . right corotid enderectomy  12/2010   Dr. Scot Dock  . SKIN SPLIT GRAFT Left 12/13/2015   Procedure: with possible skin graft;  Surgeon: Melissa Montane, MD;  Location: [place];  Service: ENT;  Laterality: Left;  . TONSILLECTOMY      Current Medications: Current Meds  Medication Sig  . albuterol (PROVENTIL HFA;VENTOLIN HFA) 108 (90 Base) MCG/ACT inhaler Inhale 2 puffs into the lungs every 6 (six) hours as needed for wheezing or shortness of breath.  Marland Kitchen alendronate (FOSAMAX) 70 MG tablet Take 1 tablet (70 mg total) by mouth once a week.  Take with a full glass of water on an empty stomach.  Marland Kitchen allopurinol (ZYLOPRIM) 100 MG tablet TAKE 1 TABLET ONCE DAILY. (Patient taking differently: Take 100 mg by mouth daily. )  . amiodarone (PACERONE) 200 MG tablet TAKE 1 TABLET ONCE DAILY.  . carvedilol (COREG) 3.125 MG tablet TAKE 1 TABLET BY MOUTH TWICE DAILY WITH A MEAL. (Patient taking differently: Take 3.125 mg by mouth See admin instructions. Takes if the systolic the blood pressure is over 100)  . cholecalciferol (VITAMIN D) 1000 units tablet Take 1,000 Units by mouth daily.  Marland Kitchen doxycycline (VIBRA-TABS) 100 MG tablet Take 1  tablet (100 mg total) by mouth 2 (two) times daily.  . fluticasone (CUTIVATE) 0.05 % cream Apply 1 application topically 2 (two) times daily.   . fluticasone-salmeterol (ADVAIR HFA) 115-21 MCG/ACT inhaler Inhale 2 puffs into the lungs 2 (two) times daily.  . furosemide (LASIX) 20 MG tablet Take 1 tablet (20 mg total) by mouth daily.  Marland Kitchen guaiFENesin (MUCINEX) 600 MG 12 hr tablet Take 600 mg by mouth 2 (two) times daily.   Marland Kitchen levothyroxine (SYNTHROID, LEVOTHROID) 50 MCG tablet Take 50 mcg by mouth daily before breakfast.  . magnesium oxide (MAG-OX) 400 (241.3 Mg) MG tablet Take 0.5 tablets (200 mg total) by mouth daily.  . midodrine (PROAMATINE) 2.5 MG tablet TAKE 1 TABLET 3 TIMES DAILY WITH MEALS. (Patient taking differently: Take 2.5 mg by mouth 3 (three) times daily with meals. )  . Multiple Vitamin (MULTIVITAMIN) capsule Take 1 capsule by mouth daily.  . Multiple Vitamins-Minerals (PRESERVISION AREDS 2 PO) Take 1 capsule by mouth daily.  Marland Kitchen oxybutynin (DITROPAN) 5 MG tablet TAKE 1 TABLET BY MOUTH TWICE DAILY.  Marland Kitchen polyethylene glycol powder (QC NATURA-LAX) powder 1 capful in 8oz water twice daily (Patient taking differently: 1 capful in [place] water as needed)  . potassium chloride (K-DUR) 10 MEQ tablet Take 1 tablet (10 mEq total) by mouth daily.  . pravastatin (PRAVACHOL) 40 MG tablet TAKE 1 TABLET ONCE DAILY.  Marland Kitchen  sertraline (ZOLOFT) 50 MG tablet TAKE ONE TABLET AT BEDTIME.  Marland Kitchen thiamine 100 MG tablet Take 1 tablet (100 mg total) by mouth daily.  . traMADol (ULTRAM) 50 MG tablet Take 1 tablet (50 mg total) by mouth every 6 (six) hours as needed for severe pain.  . [DISCONTINUED] furosemide (LASIX) 20 MG tablet Take 1 tablet Monday, Wednesday and Friday (Patient taking differently: Take 20 mg by mouth every Monday, Wednesday, and Friday. )  . [DISCONTINUED] potassium chloride (K-DUR) 10 MEQ tablet Take 1 tablet Monday, Wednesday and Friday (Patient taking differently: Take 10 mEq by mouth every Monday, Wednesday, and Friday. )     Allergies:   Lisinopril   Social History   Socioeconomic History  . Marital status: Married    Spouse name: Not on file  . Number of children: 3  . Years of education: Not on file  . Highest education level: Not on file  Occupational History  . Occupation: laywer    Comment: retired  Scientific laboratory technician  . Financial resource strain: Not on file  . Food insecurity:    Worry: Not on file    Inability: Not on file  . Transportation needs:    Medical: Not on file    Non-medical: Not on file  Tobacco Use  . Smoking status: Former Smoker    Packs/day: 1.50    Years: 65.00    Pack years: 97.50    Types: Cigarettes, Cigars, Pipe    Last attempt to quit: 03/13/2008    Years since quitting: 10.4  . Smokeless tobacco: Never Used  . Tobacco comment: quit about 10 years ago  Substance and Sexual Activity  . Alcohol use: Yes    Alcohol/week: 2.0 standard drinks    Types: 1 Cans of beer, 1 Glasses of wine per week    Comment: h/o heavy use, but "quit" drinking and now only drinks 1-3 daily  . Drug use: No  . Sexual activity: Not on file  Lifestyle  . Physical activity:    Days per week: Not on file    Minutes per  session: Not on file  . Stress: Not on file  Relationships  . Social connections:    Talks on phone: Not on file    Gets together: Not on file    Attends  religious service: Not on file    Active member of club or organization: Not on file    Attends meetings of clubs or organizations: Not on file    Relationship status: Not on file  Other Topics Concern  . Not on file  Social History Narrative   ** Merged History Encounter **       Lives locally.  Had been living with wife who had become quite ill recently and died on the evening of April 20, 2016.     Family History: The patient's family history includes Anxiety disorder in his son; Cancer in his mother; Heart disease in his father and mother; Hypertension in his father and mother; Scoliosis in his sister; Thyroid disease in his daughter.  ROS:   Please see the history of present illness.     All other systems reviewed and are negative.  EKGs/Labs/Other Studies Reviewed:    The following studies were reviewed today:  Echo 09/30/17: Study Conclusions - Left ventricle: The cavity size was normal. Wall thickness was   increased in a pattern of mild LVH. Systolic function was   moderately to severely reduced. The estimated ejection fraction   was in the range of 30% to 35%. Diffuse hypokinesis. Features are   consistent with a pseudonormal left ventricular filling pattern,   with concomitant abnormal relaxation and increased filling   pressure (grade 2 diastolic dysfunction). Doppler parameters are   consistent with high ventricular filling pressure. - Aortic valve: Valve mobility was restricted. There was mild   stenosis. There was no regurgitation. Peak velocity (S): 240   cm/s. Mean gradient (S): 14 mm Hg. Valve area (VTI): 1.05 cm^2.   Valve area (Vmax): 1.09 cm^2. Valve area (Vmean): 1.04 cm^2. - Mitral valve: Mildly calcified annulus. Transvalvular velocity   was within the normal range. There was no evidence for stenosis.   There was mild regurgitation. - Left atrium: The atrium was moderately to severely dilated. - Right ventricle: Pacer wire or catheter noted in right  ventricle.   Systolic function was normal. - Tricuspid valve: There was mild regurgitation. - Pulmonary arteries: Systolic pressure was mildly increased. PA   peak pressure: 43 mm Hg (S).   Heart cath 04/03/16: 1. Prox Cx to Mid Cx lesion, 100% stenosed. 2. Prox LAD to Mid LAD lesion, 40% stenosed. The lesion was previously treated with a stent (unknown type). 3. 4th Mrg lesion, 80% stenosed. 4. Ost LM lesion, 60% stenosed.    Chronic total occlusion of the dominant circumflex coronary artery. The circumflex territory fills by left-to-right collaterals.  Ostial 50% left main with large eccentric calcified ostial plaque noted on fluoroscopy and angiography.  Previously stented proximal LAD is patent but with moderate diffuse in-stent restenosis up to 40-50%.  Nondominant right coronary artery.  Left ventricular systolic dysfunction with akinesis of the anterolateral wall and inferior wall. Estimated ejection fraction is 30-35% moderate elevation in left ventricular filling pressures.  Compared to angiography performed in 2009 the eccentric plaque in the ostial left main is new, but does not appear to be significantly obstructive.  RECOMMENDATIONS:   Per treating team and EP consultants.  There is not appear to be disease that will require revascularization at this time. The lateral wall has been dependent upon collateral flow  for greater than 8 years.   EKG:  EKG is not ordered today.    Recent Labs: 09/30/2017: B Natriuretic Peptide 1,348.6 12/16/2017: Pro B Natriuretic peptide (BNP) 673.0 02/13/2018: TSH 7.65 07/23/2018: ALT 28; Magnesium 2.0 07/24/2018: BUN 17; Creatinine, Ser 1.03; Potassium 3.5; Sodium 133 07/31/2018: Hemoglobin 10.1; Platelets 184.0  Recent Lipid Panel    Component Value Date/Time   CHOL 127 05/11/2015 0805   TRIG 140.0 05/11/2015 0805   TRIG 163 (H) 11/01/2006 0750   HDL 41.70 05/11/2015 0805   CHOLHDL 3 05/11/2015 0805   VLDL 28.0 05/11/2015  0805   LDLCALC 57 05/11/2015 0805   LDLDIRECT 60.5 03/25/2008 1002    Physical Exam:    VS:  BP 114/64 (BP Location: Left Arm, Patient Position: Sitting, Cuff Size: Normal)   Pulse 77   Ht 5\' 11"  (1.803 m)   Wt 175 lb (79.4 kg)   BMI 24.41 kg/m     Wt Readings from Last 3 Encounters:  08/05/18 175 lb (79.4 kg)  08/04/18 175 lb 12.8 oz (79.7 kg)  07/31/18 178 lb 12.8 oz (81.1 kg)     GEN: elderly male, frail, in no acute distress, healing head lack HEENT: Normal NECK: minimal JVD; No carotid bruits CARDIAC: RRR, 5/6 murmur RESPIRATORY:  Diminished in bases, respirations unlabored ABDOMEN: Soft, non-tender, non-distended MUSCULOSKELETAL:  1+ edema; compression socks in place  SKIN: Warm and dry NEUROLOGIC:  Alert and oriented x 3 PSYCHIATRIC:  Normal affect   ASSESSMENT:    1. Chronic combined systolic and diastolic congestive heart failure (HCC)   2. Cardiomyopathy, ischemic   3. CAD S/P percutaneous coronary angioplasty   4. Essential hypertension   5. Pneumonia due to infectious organism, unspecified laterality, unspecified part of lung    PLAN:    In order of problems listed above:  Chronic combined systolic and diastolic congestive heart failure ([place]) - Plan: Basic metabolic panel Cardiomyopathy, ischemic It sounds as though he is experiencing orthopnea and increased lower extremity swelling in the setting of pneumonia. Respirations unlabored.  I will further increase his Lasix to 20 mg daily with a subsequent increase in potassium to 10 mEq daily.  I would like to see him back in 2 weeks.   CAD S/P percutaneous coronary angioplasty No chest pain, stable.  Essential hypertension Pressure is well controlled today at 114/64.  He takes Midrin.  I do not think that his increase in Lasix will significantly reduce his blood pressure.  Pneumonia Continue doxycycline as prescribed.   I would like to see him in close follow-up, in 2 weeks.  I am concerned about a  CHF exacerbation in the setting of pneumonia.   Medication Adjustments/Labs and Tests Ordered: Current medicines are reviewed at length with the patient today.  Concerns regarding medicines are outlined above.  Orders Placed This Encounter  Procedures  . Basic metabolic panel   Meds ordered this encounter  Medications  . furosemide (LASIX) 20 MG tablet    Sig: Take 1 tablet (20 mg total) by mouth daily.    Dispense:  30 tablet    Refill:  2  . potassium chloride (K-DUR) 10 MEQ tablet    Sig: Take 1 tablet (10 mEq total) by mouth daily.    Dispense:  30 tablet    Refill:  2    Signed, Ledora Bottcher, PA  08/05/2018 11:25 AM    [facility]

## 2018-08-05 ENCOUNTER — Ambulatory Visit (INDEPENDENT_AMBULATORY_CARE_PROVIDER_SITE_OTHER): Payer: Medicare Other | Admitting: Physician Assistant

## 2018-08-05 ENCOUNTER — Encounter: Payer: Self-pay | Admitting: Physician Assistant

## 2018-08-05 VITALS — BP 114/64 | HR 77 | Ht 71.0 in | Wt 175.0 lb

## 2018-08-05 DIAGNOSIS — I5042 Chronic combined systolic (congestive) and diastolic (congestive) heart failure: Secondary | ICD-10-CM | POA: Diagnosis not present

## 2018-08-05 DIAGNOSIS — Z9861 Coronary angioplasty status: Secondary | ICD-10-CM | POA: Diagnosis not present

## 2018-08-05 DIAGNOSIS — J189 Pneumonia, unspecified organism: Secondary | ICD-10-CM | POA: Diagnosis not present

## 2018-08-05 DIAGNOSIS — I2510 Atherosclerotic heart disease of native coronary artery without angina pectoris: Secondary | ICD-10-CM

## 2018-08-05 DIAGNOSIS — I255 Ischemic cardiomyopathy: Secondary | ICD-10-CM | POA: Diagnosis not present

## 2018-08-05 DIAGNOSIS — I10 Essential (primary) hypertension: Secondary | ICD-10-CM | POA: Diagnosis not present

## 2018-08-05 MED ORDER — POTASSIUM CHLORIDE ER 10 MEQ PO TBCR: 10.0000 meq | EXTENDED_RELEASE_TABLET | Freq: Every day | ORAL | 2 refills | Status: DC

## 2018-08-05 MED ORDER — FUROSEMIDE 20 MG PO TABS: 20.0000 mg | ORAL_TABLET | Freq: Every day | ORAL | 2 refills | Status: DC

## 2018-08-05 NOTE — Patient Instructions (Signed)
Medication Instructions:  Start Lasix 20 mg daily. Start Potassium 10 mEq daily. If you need a refill on your cardiac medications before your next appointment, please call your pharmacy.   Lab work: BMET (in 2 weeks) If you have labs (blood work) drawn today and your tests are completely normal, you will receive your results only by: Marland Kitchen MyChart Message (if you have MyChart) OR . A paper copy in the mail If you have any lab test that is abnormal or we need to change your treatment, we will call you to review the results.  Testing/Procedures: None Ordered  Follow-Up: At [facility], you and your health needs are our priority.  As part of our continuing mission to provide you with exceptional heart care, we have created designated Provider Care Teams.  These Care Teams include your primary Cardiologist (physician) and Advanced Practice Providers (APPs -  Physician Assistants and Nurse Practitioners) who all work together to provide you with the care you need, when you need it. . You will need a follow up appointment in 2 weeks. See any of the APP's, if Janace Hoard Duke is available schedule with her.  Any Other Special Instructions Will Be Listed Below (If Applicable). Have BMET completed in 2 weeks with that appointment.

## 2018-08-05 NOTE — Telephone Encounter (Signed)
Can you please confirm if this patient was to continue taking this medication? Did not see it on the current list of medications.

## 2018-08-06 ENCOUNTER — Other Ambulatory Visit: Payer: Medicare Other

## 2018-08-06 ENCOUNTER — Other Ambulatory Visit: Payer: Self-pay | Admitting: Pulmonary Disease

## 2018-08-06 DIAGNOSIS — I2510 Atherosclerotic heart disease of native coronary artery without angina pectoris: Secondary | ICD-10-CM | POA: Diagnosis not present

## 2018-08-06 DIAGNOSIS — N183 Chronic kidney disease, stage 3 (moderate): Secondary | ICD-10-CM | POA: Diagnosis not present

## 2018-08-06 DIAGNOSIS — J181 Lobar pneumonia, unspecified organism: Secondary | ICD-10-CM | POA: Diagnosis not present

## 2018-08-06 DIAGNOSIS — I130 Hypertensive heart and chronic kidney disease with heart failure and stage 1 through stage 4 chronic kidney disease, or unspecified chronic kidney disease: Secondary | ICD-10-CM | POA: Diagnosis not present

## 2018-08-06 DIAGNOSIS — S0181XD Laceration without foreign body of other part of head, subsequent encounter: Secondary | ICD-10-CM | POA: Diagnosis not present

## 2018-08-06 DIAGNOSIS — I5042 Chronic combined systolic (congestive) and diastolic (congestive) heart failure: Secondary | ICD-10-CM | POA: Diagnosis not present

## 2018-08-06 DIAGNOSIS — K922 Gastrointestinal hemorrhage, unspecified: Secondary | ICD-10-CM | POA: Diagnosis not present

## 2018-08-07 ENCOUNTER — Inpatient Hospital Stay: Payer: Medicare Other | Admitting: Primary Care

## 2018-08-07 ENCOUNTER — Ambulatory Visit (INDEPENDENT_AMBULATORY_CARE_PROVIDER_SITE_OTHER): Payer: Medicare Other

## 2018-08-07 ENCOUNTER — Other Ambulatory Visit: Payer: Self-pay | Admitting: Pulmonary Disease

## 2018-08-07 ENCOUNTER — Encounter: Payer: Self-pay | Admitting: Cardiology

## 2018-08-07 DIAGNOSIS — I5042 Chronic combined systolic (congestive) and diastolic (congestive) heart failure: Secondary | ICD-10-CM | POA: Diagnosis not present

## 2018-08-07 DIAGNOSIS — Z95810 Presence of automatic (implantable) cardiac defibrillator: Secondary | ICD-10-CM | POA: Diagnosis not present

## 2018-08-07 NOTE — Telephone Encounter (Signed)
Per SN: DO NOT FILL.  Dr Percival Spanish confirms to leave him off the Plavix.  Called spoke with pharmacist Ronalee Belts at [facility] - verbal given to [place].   Xarelto is no longer on the med list - was removed at hospital discharge for GI bleeding  Will sign off

## 2018-08-07 NOTE — Telephone Encounter (Signed)
Plavix refill- SN aware, on hold for now.  SN sent message to Cardiology.

## 2018-08-08 NOTE — Progress Notes (Signed)
EPIC Encounter for ICM Monitoring  Patient Name: [name] is a 84 y.o. male Date: 08/08/2018 Primary Care Physican: Noralee Space, MD Primary Cardiologist: Hochrein Electrophysiologist: Faustino Congress Weight:unknown  Call todaughter [name] and unable to reach.  Heart Failure questions reviewed, pt had respiratory infection for the last couple of weeks.  He is starting to feel better due to taking antibiotics but still has some shortness of breath.   Per Fabian Sharp, PA office note 10/15, patient dx with pneumonia and lower extremity swelling (Lasix increased to daily).   Thoracic impedance returning close to baseline.    Prescribed:  Furosemide20 mg 1 tabletdaily.Potassium 10 mEq 1 tablet daily.   Labs: 07/24/2018 Creatinine 1.03, BUN 17, Potassium 3.5, Sodium 133, eGFR >60 07/23/2018 Creatinine 1.10, BUN 20, Potassium 4.1, Sodium 134, eGFR 59->60  07/22/2018 Creatinine 1.09, BUN 28, Potassium 4.1, Sodium 135, eGFR >60  07/21/2018 Creatinine 1.40, BUN 30, Potassium 4.4, Sodium 140,    5:42 AM  07/21/2018 Creatinine 1.36, BUN 28, Potassium 4.4, Sodium 138, eGFR 46-53  02/13/2018 Creatinine1.15, BUN24, Potassium5.1, Sodium138, [id number] 02/10/2018 Creatinine0.98, BUN23, Potassium4.0, Sodium134, EGFR>60 12/16/2017 Creatinine 0.90, BUN 17, Potassium 4.4, Sodium 138 11/13/2017 Creatinine 0.92, BUN 16, Potassium 4.3, Sodium 136, EGFR 76-88 10/30/2017 Creatinine 1.06, BUN 14, Potassium 4.6, Sodium 134, EGFR 64-74 A complete set of results can be found in Results Review.  Recommendations: No changes.    Encouraged to call for fluid symptoms.  Follow-up plan: ICM clinic phone appointment on 08/19/2018 to recheck fluid levels.   Office visit scheduled 08/21/2018 with Almyra Deforest, PA.    Copy of ICM check sent to Dr. Caryl Comes and Fabian Sharp, PA (to provide update since office visit).     1 year ICM trend:  08/07/2018    3 month ICM trend:       [name], RN 08/08/2018 10:52 AM

## 2018-08-11 ENCOUNTER — Ambulatory Visit (INDEPENDENT_AMBULATORY_CARE_PROVIDER_SITE_OTHER): Payer: Medicare Other | Admitting: Pulmonary Disease

## 2018-08-11 ENCOUNTER — Encounter: Payer: Self-pay | Admitting: Pulmonary Disease

## 2018-08-11 VITALS — BP 116/74 | HR 66 | Temp 98.1°F | Ht 71.0 in | Wt 173.8 lb

## 2018-08-11 DIAGNOSIS — I712 Thoracic aortic aneurysm, without rupture, unspecified: Secondary | ICD-10-CM

## 2018-08-11 DIAGNOSIS — M159 Polyosteoarthritis, unspecified: Secondary | ICD-10-CM

## 2018-08-11 DIAGNOSIS — I2510 Atherosclerotic heart disease of native coronary artery without angina pectoris: Secondary | ICD-10-CM | POA: Diagnosis not present

## 2018-08-11 DIAGNOSIS — I255 Ischemic cardiomyopathy: Secondary | ICD-10-CM

## 2018-08-11 DIAGNOSIS — Z95810 Presence of automatic (implantable) cardiac defibrillator: Secondary | ICD-10-CM | POA: Diagnosis not present

## 2018-08-11 DIAGNOSIS — S0181XD Laceration without foreign body of other part of head, subsequent encounter: Secondary | ICD-10-CM | POA: Diagnosis not present

## 2018-08-11 DIAGNOSIS — R5381 Other malaise: Secondary | ICD-10-CM

## 2018-08-11 DIAGNOSIS — Z8701 Personal history of pneumonia (recurrent): Secondary | ICD-10-CM

## 2018-08-11 DIAGNOSIS — J449 Chronic obstructive pulmonary disease, unspecified: Secondary | ICD-10-CM | POA: Diagnosis not present

## 2018-08-11 DIAGNOSIS — W19XXXD Unspecified fall, subsequent encounter: Secondary | ICD-10-CM

## 2018-08-11 DIAGNOSIS — I5042 Chronic combined systolic (congestive) and diastolic (congestive) heart failure: Secondary | ICD-10-CM

## 2018-08-11 DIAGNOSIS — I739 Peripheral vascular disease, unspecified: Secondary | ICD-10-CM

## 2018-08-11 DIAGNOSIS — K922 Gastrointestinal hemorrhage, unspecified: Secondary | ICD-10-CM | POA: Diagnosis not present

## 2018-08-11 DIAGNOSIS — J90 Pleural effusion, not elsewhere classified: Secondary | ICD-10-CM | POA: Diagnosis not present

## 2018-08-11 DIAGNOSIS — D126 Benign neoplasm of colon, unspecified: Secondary | ICD-10-CM

## 2018-08-11 DIAGNOSIS — K5730 Diverticulosis of large intestine without perforation or abscess without bleeding: Secondary | ICD-10-CM

## 2018-08-11 DIAGNOSIS — I130 Hypertensive heart and chronic kidney disease with heart failure and stage 1 through stage 4 chronic kidney disease, or unspecified chronic kidney disease: Secondary | ICD-10-CM | POA: Diagnosis not present

## 2018-08-11 DIAGNOSIS — Z9861 Coronary angioplasty status: Secondary | ICD-10-CM

## 2018-08-11 DIAGNOSIS — M150 Primary generalized (osteo)arthritis: Secondary | ICD-10-CM

## 2018-08-11 DIAGNOSIS — N183 Chronic kidney disease, stage 3 (moderate): Secondary | ICD-10-CM | POA: Diagnosis not present

## 2018-08-11 NOTE — Progress Notes (Signed)
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 breakfast.   MAGNESIUM OXIDE (MAG-OX) 400 (241.3 MG) MG TABLET    Take 0.5 tablets (200 mg total)  by mouth daily.   MIDODRINE (PROAMATINE) 2.5 MG TABLET    TAKE 1 TABLET 3 TIMES DAILY WITH MEALS.   MULTIPLE VITAMIN (MULTIVITAMIN) CAPSULE    Take 1 capsule by mouth daily.   MULTIPLE VITAMINS-MINERALS (PRESERVISION AREDS 2 PO)    Take 1 capsule by mouth daily.   OXYBUTYNIN (DITROPAN) 5 MG TABLET    TAKE 1 TABLET BY MOUTH TWICE DAILY.   POLYETHYLENE GLYCOL POWDER (QC NATURA-LAX) POWDER    1 capful in 8oz water twice daily   POTASSIUM CHLORIDE (K-DUR) 10 MEQ TABLET    Take 1 tablet (10 mEq total) by mouth daily.   PRAVASTATIN (PRAVACHOL) 40 MG  TABLET    TAKE 1 TABLET ONCE DAILY.   SERTRALINE (ZOLOFT) 50 MG TABLET    TAKE ONE TABLET AT BEDTIME.   THIAMINE 100 MG TABLET    Take 1 tablet (100 mg total) by mouth daily.   TRAMADOL (ULTRAM) 50 MG TABLET    Take 1 tablet (50 mg total) by mouth every 6 (six) hours as needed for severe pain.  Modified Medications   No medications on file  Discontinued Medications   No medications on file

## 2018-08-11 NOTE — Patient Instructions (Addendum)
Today we updated your med list in our EPIC system...    Continue your current medications the same...  Be sure you are taking the MUCINEX/ Mucus Relief (Guaifenesin) tabs taking 1-2 tabs twice daily w/ fluids for the thick phlegm...  The Sputum specimen is still incubating & we will call when the final result is announced...  Call for any questions...  Let's plan a follow up visit in 2wks.Marland KitchenMarland Kitchen

## 2018-08-12 ENCOUNTER — Telehealth: Payer: Self-pay | Admitting: Pulmonary Disease

## 2018-08-12 DIAGNOSIS — N183 Chronic kidney disease, stage 3 (moderate): Secondary | ICD-10-CM | POA: Diagnosis not present

## 2018-08-12 DIAGNOSIS — I5042 Chronic combined systolic (congestive) and diastolic (congestive) heart failure: Secondary | ICD-10-CM | POA: Diagnosis not present

## 2018-08-12 DIAGNOSIS — I2510 Atherosclerotic heart disease of native coronary artery without angina pectoris: Secondary | ICD-10-CM | POA: Diagnosis not present

## 2018-08-12 DIAGNOSIS — I130 Hypertensive heart and chronic kidney disease with heart failure and stage 1 through stage 4 chronic kidney disease, or unspecified chronic kidney disease: Secondary | ICD-10-CM | POA: Diagnosis not present

## 2018-08-12 DIAGNOSIS — S0181XD Laceration without foreign body of other part of head, subsequent encounter: Secondary | ICD-10-CM | POA: Diagnosis not present

## 2018-08-12 DIAGNOSIS — K922 Gastrointestinal hemorrhage, unspecified: Secondary | ICD-10-CM | POA: Diagnosis not present

## 2018-08-12 MED ORDER — CIPROFLOXACIN HCL 500 MG PO TABS: 500.0000 mg | ORAL_TABLET | Freq: Two times a day (BID) | ORAL | 0 refills | Status: AC

## 2018-08-12 NOTE — Addendum Note (Signed)
Addended by: Nena Polio on: 08/12/2018 10:55 AM   Modules accepted: Orders

## 2018-08-12 NOTE — Telephone Encounter (Deleted)
Copied from [place] [phone]. Topic: Quick Communication - See Telephone Encounter >> Aug 12, 2018 11:55 AM Gardiner Ramus wrote: CRM for notification. See Telephone encounter for: 08/12/18. Nikki from [facility]  called and stated that 7 stiches are in the patients head. She believes that a scab was covering them up. Does pt need to come in to have them removed. please advise

## 2018-08-12 NOTE — Telephone Encounter (Signed)
Patient was seen by Dr. Lenna Gilford yesterday. [facility] nurse called and wanted to know if either the NP or Dr. Lenna Gilford had removed the staples from patient and if so if and when should the sutures be removed. She reported that patient has about 7 sutures on his left forehead. She stated if they are to remove the sutures they just need an order. We can fax order to [phone] or we can call and give verbal order to [phone].  Dr. Lenna Gilford please advise! Thanks!

## 2018-08-12 NOTE — Telephone Encounter (Signed)
Per SN- ok for suture removal. Called Vicky, [facility], and gave verbal order to remove sutures.Understanding stated. Nothing further at this time.

## 2018-08-13 ENCOUNTER — Ambulatory Visit: Payer: Medicare Other | Admitting: Pulmonary Disease

## 2018-08-13 DIAGNOSIS — I2510 Atherosclerotic heart disease of native coronary artery without angina pectoris: Secondary | ICD-10-CM | POA: Diagnosis not present

## 2018-08-13 DIAGNOSIS — I130 Hypertensive heart and chronic kidney disease with heart failure and stage 1 through stage 4 chronic kidney disease, or unspecified chronic kidney disease: Secondary | ICD-10-CM | POA: Diagnosis not present

## 2018-08-13 DIAGNOSIS — N183 Chronic kidney disease, stage 3 (moderate): Secondary | ICD-10-CM | POA: Diagnosis not present

## 2018-08-13 DIAGNOSIS — S0181XD Laceration without foreign body of other part of head, subsequent encounter: Secondary | ICD-10-CM | POA: Diagnosis not present

## 2018-08-13 DIAGNOSIS — I5042 Chronic combined systolic (congestive) and diastolic (congestive) heart failure: Secondary | ICD-10-CM | POA: Diagnosis not present

## 2018-08-13 DIAGNOSIS — K922 Gastrointestinal hemorrhage, unspecified: Secondary | ICD-10-CM | POA: Diagnosis not present

## 2018-08-15 ENCOUNTER — Telehealth: Payer: Self-pay | Admitting: Pulmonary Disease

## 2018-08-15 NOTE — Telephone Encounter (Signed)
Per SN- ok for speech therapy for swallowing eval.  Left message on [facility], voice mail with call back number if needed.  Nothing further at this time.

## 2018-08-16 ENCOUNTER — Other Ambulatory Visit: Payer: Self-pay | Admitting: Rheumatology

## 2018-08-18 DIAGNOSIS — I5042 Chronic combined systolic (congestive) and diastolic (congestive) heart failure: Secondary | ICD-10-CM | POA: Diagnosis not present

## 2018-08-18 DIAGNOSIS — I130 Hypertensive heart and chronic kidney disease with heart failure and stage 1 through stage 4 chronic kidney disease, or unspecified chronic kidney disease: Secondary | ICD-10-CM | POA: Diagnosis not present

## 2018-08-18 DIAGNOSIS — S0181XD Laceration without foreign body of other part of head, subsequent encounter: Secondary | ICD-10-CM | POA: Diagnosis not present

## 2018-08-18 DIAGNOSIS — K922 Gastrointestinal hemorrhage, unspecified: Secondary | ICD-10-CM | POA: Diagnosis not present

## 2018-08-18 DIAGNOSIS — N183 Chronic kidney disease, stage 3 (moderate): Secondary | ICD-10-CM | POA: Diagnosis not present

## 2018-08-18 DIAGNOSIS — I2510 Atherosclerotic heart disease of native coronary artery without angina pectoris: Secondary | ICD-10-CM | POA: Diagnosis not present

## 2018-08-18 NOTE — Telephone Encounter (Signed)
Last Visit: 03/13/18 Next Visit: 09/11/18 Labs: 07/23/18 WBC 13.5 Previously 15.3 RBC 3.00 Hgb 9.7 Previously 9.9 HCT 28.9 Previously 29.5 Platelets 132 previously 115 Sodium 133, Calcium 8  Okay to refill Fosamax?

## 2018-08-18 NOTE — Telephone Encounter (Signed)
Okay to refill alendronate.  Please advise patient to take calcium with vitamin D as his calcium is low.

## 2018-08-19 ENCOUNTER — Ambulatory Visit (INDEPENDENT_AMBULATORY_CARE_PROVIDER_SITE_OTHER): Payer: Medicare Other

## 2018-08-19 DIAGNOSIS — S0181XD Laceration without foreign body of other part of head, subsequent encounter: Secondary | ICD-10-CM | POA: Diagnosis not present

## 2018-08-19 DIAGNOSIS — K922 Gastrointestinal hemorrhage, unspecified: Secondary | ICD-10-CM | POA: Diagnosis not present

## 2018-08-19 DIAGNOSIS — I5042 Chronic combined systolic (congestive) and diastolic (congestive) heart failure: Secondary | ICD-10-CM

## 2018-08-19 DIAGNOSIS — N183 Chronic kidney disease, stage 3 (moderate): Secondary | ICD-10-CM | POA: Diagnosis not present

## 2018-08-19 DIAGNOSIS — Z95810 Presence of automatic (implantable) cardiac defibrillator: Secondary | ICD-10-CM

## 2018-08-19 DIAGNOSIS — I2510 Atherosclerotic heart disease of native coronary artery without angina pectoris: Secondary | ICD-10-CM | POA: Diagnosis not present

## 2018-08-19 DIAGNOSIS — I130 Hypertensive heart and chronic kidney disease with heart failure and stage 1 through stage 4 chronic kidney disease, or unspecified chronic kidney disease: Secondary | ICD-10-CM | POA: Diagnosis not present

## 2018-08-19 IMAGING — CT CT ANGIO CHEST
2 of 7 series · 16 of 36 positions shown · IV contrast (iopamidol)
Comparison: Chest radiograph February 10, 2018

CLINICAL DATA: Shortness of breath

EXAM:
CT ANGIOGRAPHY CHEST WITH CONTRAST
TECHNIQUE: Multidetector CT imaging of the chest was performed using the
standard protocol during bolus administration of intravenous
contrast. Multiplanar CT image reconstructions and MIPs were
obtained to evaluate the vascular anatomy.
CONTRAST:  100mL ZL4XPH-PBC IOPAMIDOL (ZL4XPH-PBC) INJECTION 76%

[Series 9: coronal f_0.6 · coronal · 0.74mm/px · 1 of 150 slices shown]
[im 75/150  mediastinal]
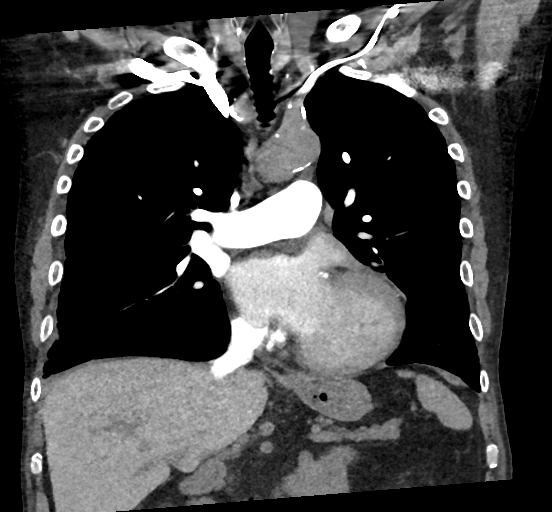

[Series 13: thins f_0.6 · axial · 0.79mm/px · z∈[+1141,+1455]mm · 15 of 513 slices shown]
[im 33/513  lung]
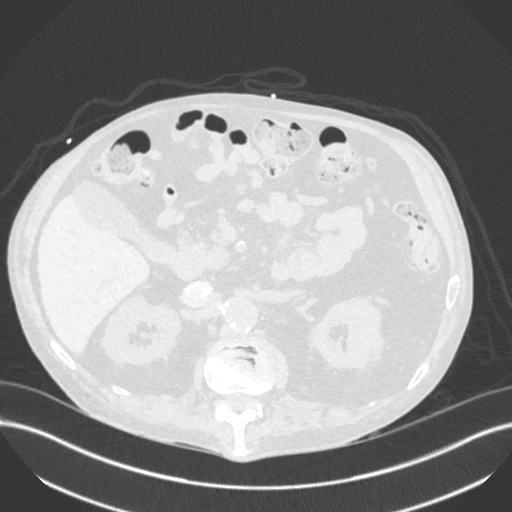
[im 65/513  mediastinal]
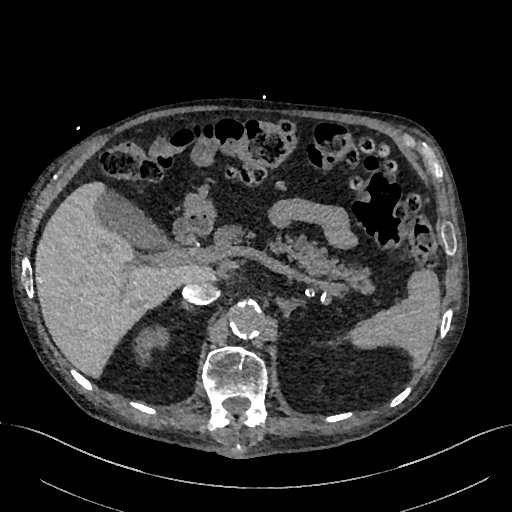
[im 97/513  lung]
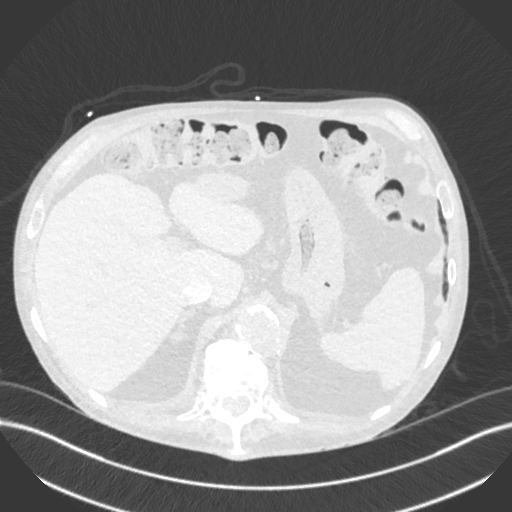
[im 129/513  mediastinal]
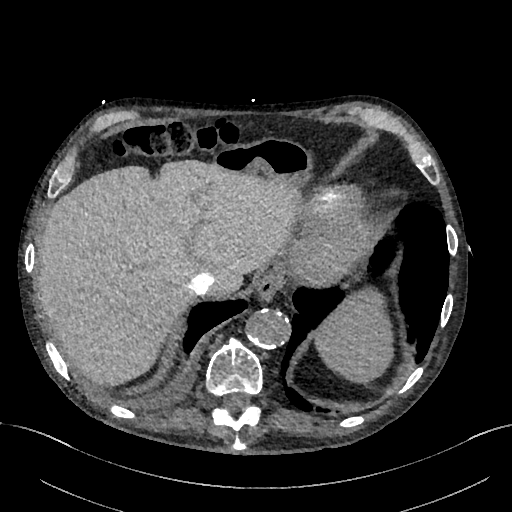
[im 161/513  lung]
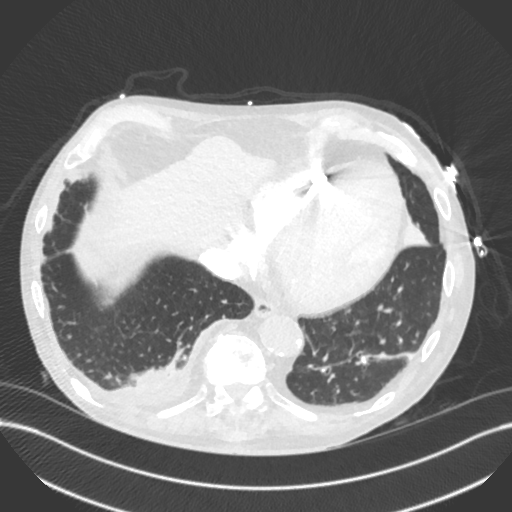
[im 193/513  mediastinal]
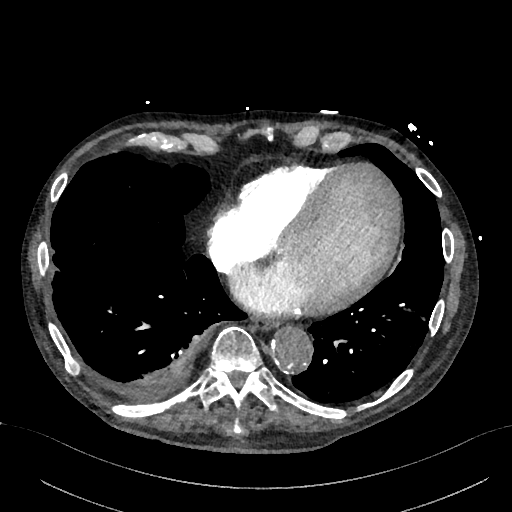
[im 225/513  lung]
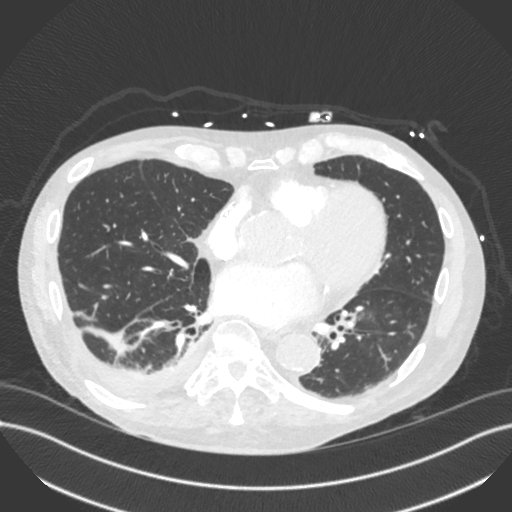
[im 257/513  mediastinal]
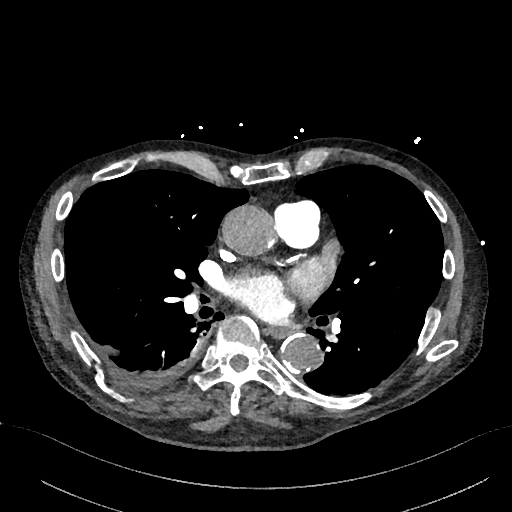
[im 289/513  lung]
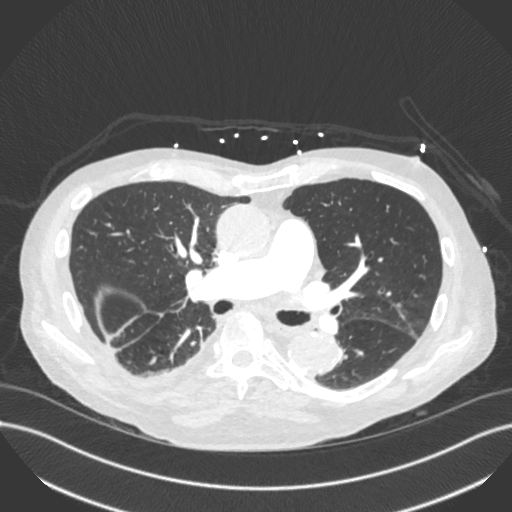
[im 321/513  mediastinal]
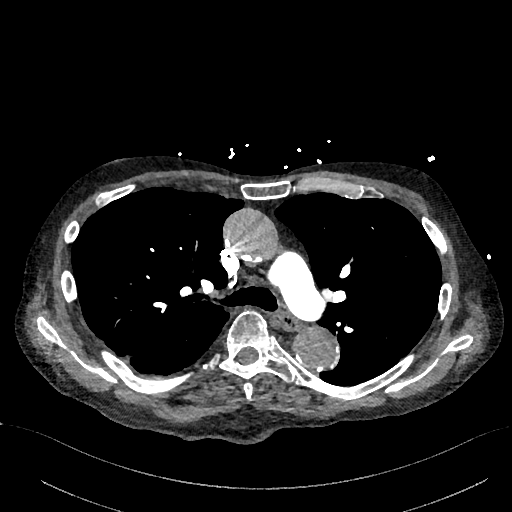
[im 353/513  lung]
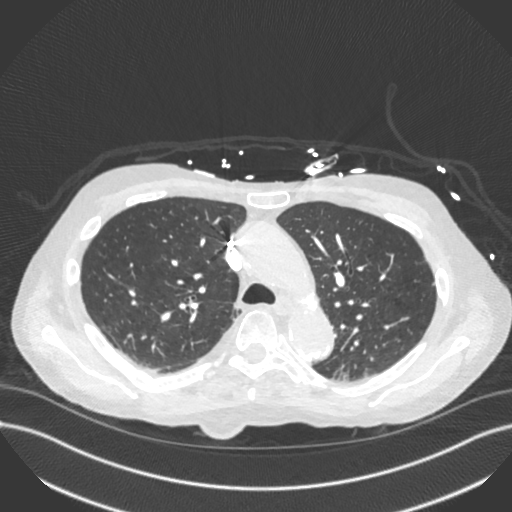
[im 385/513  mediastinal]
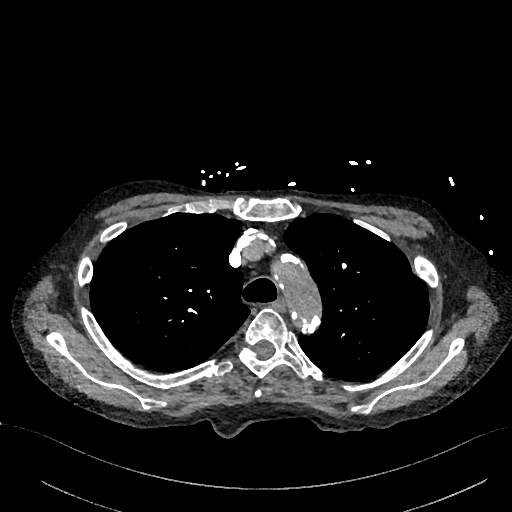
[im 417/513  lung]
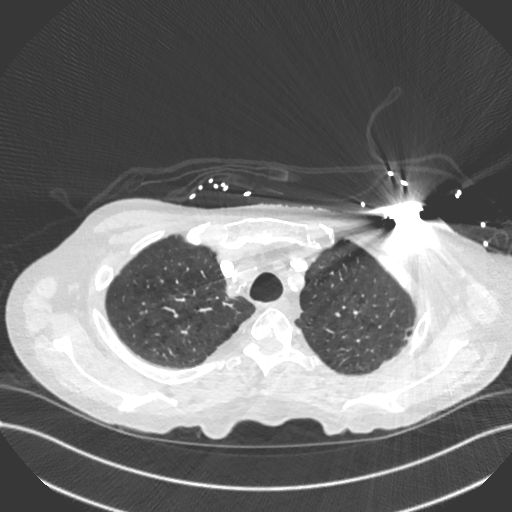
[im 449/513  mediastinal]
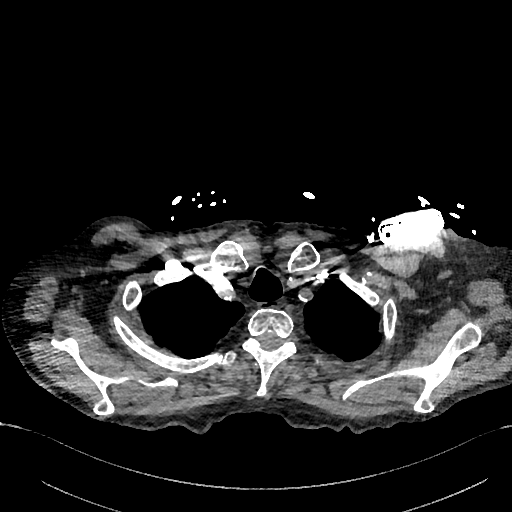
[im 481/513  lung]
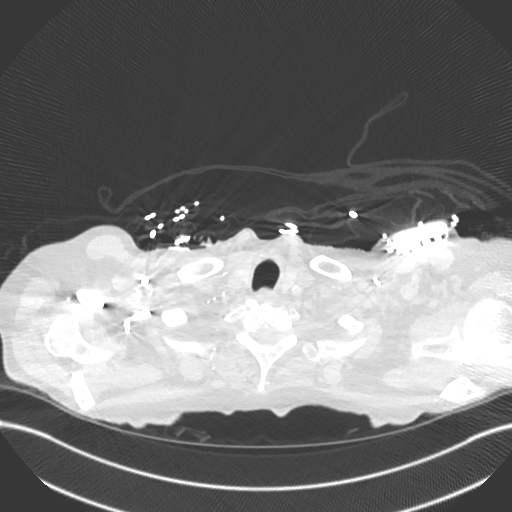

[16 of 36 positions shown; findings below may reference images not displayed]

FINDINGS: Cardiovascular: There is no demonstrable pulmonary embolus. The
ascending thoracic aortic diameter is 4.3 x 4.1 cm. No dissection is
evident on this study. It should noted that the contrast bolus is
suboptimal for exclusion radiographically thoracic dissection. There
is extensive calcification proximal visualized great vessels. There
is also calcification in the aorta as well as calcification in
multiple coronary artery branches. Pacemaker lead tips are attached
to the right atrium and right ventricle. No pericardial effusion or
pericardial thickening evident. Main pulmonary outflow tract
measures 3.3 cm, abnormally prominent.

Mediastinum/Nodes: Thyroid appears unremarkable. There are scattered
subcentimeter lymph nodes. There is no frank adenopathy by size
criteria. No esophageal lesions are demonstrable.

Lungs/Pleura: There is underlying centrilobular emphysematous
change. There is a free-flowing pleural effusion on the right,
fairly small. There is lower lobe atelectatic change bilaterally.
There is consolidation in a portion of the posterior segment right
lower lobe which is suspected to represent pneumonia superimposed
atelectasis.

Upper Abdomen: In the visualized upper abdomen, there is reflux of
contrast into the inferior vena cava. There is atherosclerotic
calcification in the aorta and major mesenteric arterial vessels.
Visualized upper abdominal structures otherwise appear normal.

Musculoskeletal: The patient has had a previous kyphoplasty
procedure at T12. There is anterior wedging of the T10, T12, and L2
vertebral bodies. There are lucent areas within the T11 and L1
vertebral bodies. There are old healed rib fractures on the right
and left sides.

Review of the MIP images confirms the above findings.
IMPRESSION: 1.  No demonstrable pulmonary embolus.

2. Prominence of the ascending thoracic aorta measuring 4.3 x
cm. No dissection seen. It should be noted that the contrast bolus
is not sufficient for confident exclusion of dissection from a
radiographic standpoint. Recommend annual imaging followup by CTA or
MRA. This recommendation follows 5909
ACCF/AHA/AATS/ACR/ASA/SCA/TIGER/RAAK/WESLLEN/MAKKA Guidelines for the
Diagnosis and Management of Patients with Thoracic Aortic Disease.
Circulation. 5909; 121: E266-e369. Note that there is extensive
arterial vascular calcification in the aorta as well as extensive
coronary artery and great vessel calcification.

3. Fractures in the lower thoracic and upper lumbar region. There
are lucent appearing areas in the T11 and L1 vertebral bodies.
Possibility of metastatic disease in these areas cannot be excluded
by current examination.

4. Small right pleural effusion. Bibasilar atelectasis. Probable
pneumonia in a portion of the posterior segment of the right lower
lobe. There is underlying centrilobular emphysematous change. There
is a small right pleural effusion.

5. Prominence of the main pulmonary outflow tract, a finding likely
indicative of underlying pulmonary arterial hypertension.

6.  No evident adenopathy.

7. Reflux of contrast into the inferior vena cava, a finding that
potentially may represent a degree of increased right heart
pressure.

8.  Pacemaker leads attached right atrium and right ventricle.

Aortic Atherosclerosis (6PCXW-7D1.1) and Emphysema (6PCXW-41U.S).

## 2018-08-19 NOTE — Progress Notes (Signed)
EPIC Encounter for ICM Monitoring  Patient Name: [name] is a 84 y.o. male Date: 08/19/2018 Primary Care Physican: Nadel, Scott M, MD Primary Cardiologist: Hochrein Electrophysiologist: Klein Dry Weight:unknown  Attempted call todaughter [name]. Left detailed message per DPR.  Transmission reviewed.   Thoracic impedance returned to normal since last ICM remote transmission.   Prescribed: Furosemide20 mg 1 tabletdaily.Potassium 10 mEq 1 tablet daily.   Labs: 07/24/2018 Creatinine1.03, BUN17, Potassium3.5, Sodium133, eGFR>60 07/23/2018 Creatinine1.10, BUN20, Potassium4.1, Sodium134, eGFR59->60  07/22/2018 Creatinine1.09, BUN28, Potassium4.1, Sodium135, eGFR>60  07/21/2018 Creatinine1.40, BUN30, Potassium4.4, Sodium140, 5:42 AM  07/21/2018 Creatinine1.36, BUN28, Potassium4.4, Sodium138, eGFR46-53 02/13/2018 Creatinine1.15, BUN24, Potassium5.1, Sodium138, EGFR58-67 02/10/2018 Creatinine0.98, BUN23, Potassium4.0, Sodium134, EGFR>60 12/16/2017 Creatinine 0.90, BUN 17, Potassium 4.4, Sodium 138 11/13/2017 Creatinine 0.92, BUN 16, Potassium 4.3, Sodium 136, EGFR 76-88 10/30/2017 Creatinine 1.06, BUN 14, Potassium 4.6, Sodium 134, EGFR 64-74 A complete set of results can be found in Results Review.  Recommendations: Left voice mail with ICM number and encouraged to call if experiencing any fluid symptoms.  Follow-up plan: ICM clinic phone appointment on 09/11/2018.  Office visit scheduled 08/21/2018 with Hao Meng, PA.    Copy of ICM check sent to Dr. Klein and Hao Meng PA  3 month ICM trend: 08/19/2018    1 Year ICM trend:        S , RN 08/19/2018 10:42 AM   

## 2018-08-21 ENCOUNTER — Ambulatory Visit (INDEPENDENT_AMBULATORY_CARE_PROVIDER_SITE_OTHER): Payer: Medicare Other | Admitting: Physician Assistant

## 2018-08-21 ENCOUNTER — Encounter: Payer: Self-pay | Admitting: Physician Assistant

## 2018-08-21 VITALS — BP 128/76 | HR 83 | Resp 16 | Ht 71.0 in | Wt 170.8 lb

## 2018-08-21 DIAGNOSIS — I255 Ischemic cardiomyopathy: Secondary | ICD-10-CM | POA: Diagnosis not present

## 2018-08-21 DIAGNOSIS — I10 Essential (primary) hypertension: Secondary | ICD-10-CM | POA: Diagnosis not present

## 2018-08-21 DIAGNOSIS — N183 Chronic kidney disease, stage 3 unspecified: Secondary | ICD-10-CM

## 2018-08-21 DIAGNOSIS — E785 Hyperlipidemia, unspecified: Secondary | ICD-10-CM

## 2018-08-21 DIAGNOSIS — K922 Gastrointestinal hemorrhage, unspecified: Secondary | ICD-10-CM

## 2018-08-21 DIAGNOSIS — Z9889 Other specified postprocedural states: Secondary | ICD-10-CM | POA: Diagnosis not present

## 2018-08-21 DIAGNOSIS — Z8679 Personal history of other diseases of the circulatory system: Secondary | ICD-10-CM | POA: Diagnosis not present

## 2018-08-21 DIAGNOSIS — I5042 Chronic combined systolic (congestive) and diastolic (congestive) heart failure: Secondary | ICD-10-CM | POA: Diagnosis not present

## 2018-08-21 DIAGNOSIS — S0181XD Laceration without foreign body of other part of head, subsequent encounter: Secondary | ICD-10-CM | POA: Diagnosis not present

## 2018-08-21 DIAGNOSIS — I2510 Atherosclerotic heart disease of native coronary artery without angina pectoris: Secondary | ICD-10-CM

## 2018-08-21 DIAGNOSIS — Z95810 Presence of automatic (implantable) cardiac defibrillator: Secondary | ICD-10-CM

## 2018-08-21 DIAGNOSIS — I472 Ventricular tachycardia, unspecified: Secondary | ICD-10-CM

## 2018-08-21 DIAGNOSIS — I130 Hypertensive heart and chronic kidney disease with heart failure and stage 1 through stage 4 chronic kidney disease, or unspecified chronic kidney disease: Secondary | ICD-10-CM | POA: Diagnosis not present

## 2018-08-21 DIAGNOSIS — D649 Anemia, unspecified: Secondary | ICD-10-CM | POA: Diagnosis not present

## 2018-08-21 LAB — CBC
Hematocrit: 35.4 % — ABNORMAL LOW (ref 37.5–51.0)
Hemoglobin: 11.6 g/dL — ABNORMAL LOW (ref 13.0–17.7)
MCH: 30.9 pg (ref 26.6–33.0)
MCHC: 32.8 g/dL (ref 31.5–35.7)
MCV: 94 fL (ref 79–97)
Platelets: 131 10*3/uL — ABNORMAL LOW (ref 150–450)
RBC: 3.75 x10E6/uL — ABNORMAL LOW (ref 4.14–5.80)
RDW: 13.9 % (ref 12.3–15.4)
WBC: 7.1 10*3/uL (ref 3.4–10.8)

## 2018-08-21 LAB — BASIC METABOLIC PANEL
BUN / CREAT RATIO: 14 (ref 10–24)
BUN: 17 mg/dL (ref 8–27)
CO2: 23 mmol/L (ref 20–29)
Calcium: 9.2 mg/dL (ref 8.6–10.2)
Chloride: 100 mmol/L (ref 96–106)
Creatinine, Ser: 1.19 mg/dL (ref 0.76–1.27)
GFR calc Af Amer: 64 mL/min/{1.73_m2} (ref >59)
GFR calc non Af Amer: 55 mL/min/{1.73_m2} — ABNORMAL LOW (ref >59)
Glucose: 90 mg/dL (ref 65–99)
POTASSIUM: 5.2 mmol/L (ref 3.5–5.2)
Sodium: 139 mmol/L (ref 134–144)

## 2018-08-21 NOTE — Patient Instructions (Signed)
Medication Instructions:  Your physician recommends that you continue on your current medications as directed. Please refer to the Current Medication list given to you today.  If you need a refill on your cardiac medications before your next appointment, please call your pharmacy.   Lab work: Your physician recommends that you return for lab work: today--CBC, BMET  If you have labs (blood work) drawn today and your tests are completely normal, you will receive your results only by: Marland Kitchen MyChart Message (if you have MyChart) OR . A paper copy in the mail If you have any lab test that is abnormal or we need to change your treatment, we will call you to review the results.  Testing/Procedures: none  Follow-Up: At [facility], you and your health needs are our priority.  As part of our continuing mission to provide you with exceptional heart care, we have created designated Provider Care Teams.  These Care Teams include your primary Cardiologist (physician) and Advanced Practice Providers (APPs -  Physician Assistants and Nurse Practitioners) who all work together to provide you with the care you need, when you need it. Please keep your scheduled follow-up appointment with Dr. Percival Spanish.  Advanced Practice Providers on your designated Care Team:   Rosaria Ferries, PA-C . Jory Sims, DNP, ANP  Any Other Special Instructions Will Be Listed Below (If Applicable). none
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
## 2018-08-22 DIAGNOSIS — K922 Gastrointestinal hemorrhage, unspecified: Secondary | ICD-10-CM | POA: Diagnosis not present

## 2018-08-22 DIAGNOSIS — S0181XD Laceration without foreign body of other part of head, subsequent encounter: Secondary | ICD-10-CM | POA: Diagnosis not present

## 2018-08-22 DIAGNOSIS — N183 Chronic kidney disease, stage 3 (moderate): Secondary | ICD-10-CM | POA: Diagnosis not present

## 2018-08-22 DIAGNOSIS — I5042 Chronic combined systolic (congestive) and diastolic (congestive) heart failure: Secondary | ICD-10-CM | POA: Diagnosis not present

## 2018-08-22 DIAGNOSIS — I130 Hypertensive heart and chronic kidney disease with heart failure and stage 1 through stage 4 chronic kidney disease, or unspecified chronic kidney disease: Secondary | ICD-10-CM | POA: Diagnosis not present

## 2018-08-22 DIAGNOSIS — I2510 Atherosclerotic heart disease of native coronary artery without angina pectoris: Secondary | ICD-10-CM | POA: Diagnosis not present

## 2018-08-25 DIAGNOSIS — I130 Hypertensive heart and chronic kidney disease with heart failure and stage 1 through stage 4 chronic kidney disease, or unspecified chronic kidney disease: Secondary | ICD-10-CM | POA: Diagnosis not present

## 2018-08-25 DIAGNOSIS — K922 Gastrointestinal hemorrhage, unspecified: Secondary | ICD-10-CM | POA: Diagnosis not present

## 2018-08-25 DIAGNOSIS — I2510 Atherosclerotic heart disease of native coronary artery without angina pectoris: Secondary | ICD-10-CM | POA: Diagnosis not present

## 2018-08-25 DIAGNOSIS — I5042 Chronic combined systolic (congestive) and diastolic (congestive) heart failure: Secondary | ICD-10-CM | POA: Diagnosis not present

## 2018-08-25 DIAGNOSIS — N183 Chronic kidney disease, stage 3 (moderate): Secondary | ICD-10-CM | POA: Diagnosis not present

## 2018-08-25 DIAGNOSIS — S0181XD Laceration without foreign body of other part of head, subsequent encounter: Secondary | ICD-10-CM | POA: Diagnosis not present

## 2018-08-26 ENCOUNTER — Ambulatory Visit (INDEPENDENT_AMBULATORY_CARE_PROVIDER_SITE_OTHER): Payer: Medicare Other | Admitting: Pulmonary Disease

## 2018-08-26 ENCOUNTER — Ambulatory Visit (INDEPENDENT_AMBULATORY_CARE_PROVIDER_SITE_OTHER): Payer: Medicare Other

## 2018-08-26 ENCOUNTER — Encounter: Payer: Self-pay | Admitting: Pulmonary Disease

## 2018-08-26 VITALS — BP 130/60 | HR 83 | Temp 98.1°F | Ht 71.0 in | Wt 171.8 lb

## 2018-08-26 DIAGNOSIS — M8000XD Age-related osteoporosis with current pathological fracture, unspecified site, subsequent encounter for fracture with routine healing: Secondary | ICD-10-CM

## 2018-08-26 DIAGNOSIS — J90 Pleural effusion, not elsewhere classified: Secondary | ICD-10-CM

## 2018-08-26 DIAGNOSIS — J9611 Chronic respiratory failure with hypoxia: Secondary | ICD-10-CM

## 2018-08-26 DIAGNOSIS — D126 Benign neoplasm of colon, unspecified: Secondary | ICD-10-CM | POA: Diagnosis not present

## 2018-08-26 DIAGNOSIS — I2510 Atherosclerotic heart disease of native coronary artery without angina pectoris: Secondary | ICD-10-CM | POA: Diagnosis not present

## 2018-08-26 DIAGNOSIS — I255 Ischemic cardiomyopathy: Secondary | ICD-10-CM | POA: Diagnosis not present

## 2018-08-26 DIAGNOSIS — M159 Polyosteoarthritis, unspecified: Secondary | ICD-10-CM

## 2018-08-26 DIAGNOSIS — I739 Peripheral vascular disease, unspecified: Secondary | ICD-10-CM

## 2018-08-26 DIAGNOSIS — Z95810 Presence of automatic (implantable) cardiac defibrillator: Secondary | ICD-10-CM | POA: Diagnosis not present

## 2018-08-26 DIAGNOSIS — J449 Chronic obstructive pulmonary disease, unspecified: Secondary | ICD-10-CM | POA: Diagnosis not present

## 2018-08-26 DIAGNOSIS — I712 Thoracic aortic aneurysm, without rupture, unspecified: Secondary | ICD-10-CM

## 2018-08-26 DIAGNOSIS — K922 Gastrointestinal hemorrhage, unspecified: Secondary | ICD-10-CM

## 2018-08-26 DIAGNOSIS — W19XXXS Unspecified fall, sequela: Secondary | ICD-10-CM

## 2018-08-26 DIAGNOSIS — Z8701 Personal history of pneumonia (recurrent): Secondary | ICD-10-CM | POA: Diagnosis not present

## 2018-08-26 DIAGNOSIS — M150 Primary generalized (osteo)arthritis: Secondary | ICD-10-CM

## 2018-08-26 DIAGNOSIS — I5042 Chronic combined systolic (congestive) and diastolic (congestive) heart failure: Secondary | ICD-10-CM | POA: Diagnosis not present

## 2018-08-26 DIAGNOSIS — K5730 Diverticulosis of large intestine without perforation or abscess without bleeding: Secondary | ICD-10-CM | POA: Diagnosis not present

## 2018-08-26 DIAGNOSIS — Z9861 Coronary angioplasty status: Secondary | ICD-10-CM

## 2018-08-26 DIAGNOSIS — R269 Unspecified abnormalities of gait and mobility: Secondary | ICD-10-CM

## 2018-08-26 DIAGNOSIS — R5381 Other malaise: Secondary | ICD-10-CM

## 2018-08-26 MED ORDER — LEVOFLOXACIN 750 MG PO TABS: 750.0000 mg | ORAL_TABLET | Freq: Every day | ORAL | 0 refills | Status: DC

## 2018-08-26 MED ORDER — METHYLPREDNISOLONE ACETATE 80 MG/ML IJ SUSP
80.0000 mg | Freq: Once | INTRAMUSCULAR | Status: AC
  Administered 2018-08-26: 80 mg via INTRAMUSCULAR

## 2018-08-26 MED ORDER — PREDNISONE 10 MG PO TABS: 10.0000 mg | ORAL_TABLET | Freq: Every day | ORAL | 1 refills | Status: DC

## 2018-08-26 NOTE — Patient Instructions (Addendum)
Today we updated your med list in our EPIC system...    Continue your current medications the same...  Continue the Advair115/21 - 2 sprays twice daily, then rinse mouth...  Increase the MUCINEX to 600mg  - 2 tabs twice daiy w/ fluids...  We wrote for an additional antibiotic- LEVAQUIN 750mg  one tab daily x 7d til gone...  We also wrote for a low dose of Prednisone- start the 10mg  tabs taking one tab each AM til return visit w/ me in 2 weeks.  Keep up the good work w/ your exercise program- trying to improve your stamina over time...  Let's plan another brief follow up visit in 2 weeks.Marland KitchenMarland Kitchen

## 2018-08-26 NOTE — Progress Notes (Addendum)
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 the fosamax70/wk...   Hx SHINGLES superimposed on his Seborrheic Dermatitis>> presented 05/2017 w/ left C2 distrib shingles... BASAL CELL Ca removed from behind his left pinna by DrByers 11/2015=> pt did not follow up & DrHall Dx recurrent BCCa 2019 & referred to [place], pt awaiting appt...  05/27/17>   Nivin has a signif shingles outbreak over the left C2 distrib although I cannot r/o some Trigem nerve overlap here;  He needs to  continue the Keflex Bid & ValtrexTid til gone, and we will start a Pred taper for the amt of imflamm present;  His skin/ scalp needs attention from his severe seb derm + the njew shingles outbreak> I suggest that he get  in the shower/ tub Bid & gently wash/ soak the area on his neck & scalp, then gently use a washcloth to remove dead skin & clean the area; pat dry w/ clean towels; then he can apply the salve provided by Derm (topical lidocaine gel) if needed for pain... He needs to have this checked again in about 1wk by myself or Derm & have his Seb Dermatitis addressed more vigorously + prob excision of the left leg lesion 06/18/17>   Alen has a combination of seb dermatitis on scalp/ face w/ flaking etc, and shingles/ post herpetic neuralgia involving mainly left C2 distrib;  We reviewed Tramadol/ Tylenol for pain + try Lotrosone cream for the seb derm- he really needs to work on tis to clean it up    Patient's Medications  New Prescriptions   LEVOFLOXACIN (LEVAQUIN) 750 MG TABLET    Take 1 tablet (750 mg total) by mouth daily.   PREDNISONE (DELTASONE) 10 MG TABLET    Take 1 tablet (10 mg total) by mouth daily with breakfast.  Previous Medications   ALBUTEROL (PROVENTIL HFA;VENTOLIN HFA) 108 (90 BASE) MCG/ACT INHALER    Inhale 2 puffs into the lungs every 6 (six) hours as needed for wheezing or shortness of breath.   ALENDRONATE (FOSAMAX) 70 MG TABLET    TAKE 1 TAB ONCE A WEEK, AT LEAST 30 MIN BEFORE 1ST FOOD.DO NOT LIE DOWN FOR 30 MIN AFTER TAKING.   ALLOPURINOL (ZYLOPRIM) 100 MG TABLET    Take 1 tablet (100 mg total) by mouth daily.   AMIODARONE (PACERONE) 200 MG TABLET    TAKE 1 TABLET ONCE DAILY.   CARVEDILOL (COREG) 3.125 MG TABLET    TAKE 1 TABLET BY MOUTH TWICE DAILY WITH A MEAL.   CHOLECALCIFEROL (VITAMIN D) 1000 UNITS TABLET    Take 1,000 Units by mouth daily.   FLUTICASONE (CUTIVATE) 0.05 % CREAM    Apply 1 application topically 2 (two) times daily.    FLUTICASONE-SALMETEROL (ADVAIR HFA) 115-21 MCG/ACT INHALER    Inhale 2 puffs into the lungs 2 (two) times daily.   FUROSEMIDE (LASIX) 20 MG TABLET    Take 1 tablet (20 mg total) by mouth daily.   GUAIFENESIN (MUCINEX) 600 MG 12 HR TABLET    Take 600 mg  by mouth 2 (two) times daily.    LEVOTHYROXINE (SYNTHROID, LEVOTHROID) 50 MCG TABLET    Take 50 mcg by mouth daily before breakfast.   MAGNESIUM OXIDE (MAG-OX) 400 (241.3 MG) MG TABLET    Take 0.5 tablets (200 mg total) by mouth daily.   MIDODRINE (PROAMATINE) 2.5 MG TABLET    TAKE 1 TABLET 3 TIMES DAILY WITH MEALS.   MULTIPLE VITAMIN (MULTIVITAMIN) CAPSULE    Take 1 capsule by mouth daily.   MULTIPLE VITAMINS-MINERALS (PRESERVISION AREDS 2 PO)    Take 1 capsule by mouth daily.   OXYBUTYNIN (DITROPAN) 5 MG TABLET    TAKE 1 TABLET BY MOUTH TWICE DAILY.   POLYETHYLENE GLYCOL POWDER (QC NATURA-LAX) POWDER    1 capful in 8oz water twice daily   POTASSIUM CHLORIDE (K-DUR) 10 MEQ TABLET    Take 1 tablet (10 mEq total) by mouth daily.   PRAVASTATIN (PRAVACHOL) 40 MG TABLET    TAKE 1  TABLET ONCE DAILY.   SERTRALINE (ZOLOFT) 50 MG TABLET    TAKE ONE TABLET AT BEDTIME.   THIAMINE 100 MG TABLET    Take 1 tablet (100 mg total) by mouth daily.   TRAMADOL (ULTRAM) 50 MG TABLET    Take 1 tablet (50 mg total) by mouth every 6 (six) hours as needed for severe pain.  Modified Medications   No medications on file  Discontinued Medications   No medications on file

## 2018-08-27 DIAGNOSIS — C44219 Basal cell carcinoma of skin of left ear and external auricular canal: Secondary | ICD-10-CM | POA: Diagnosis not present

## 2018-08-27 DIAGNOSIS — D0421 Carcinoma in situ of skin of right ear and external auricular canal: Secondary | ICD-10-CM | POA: Diagnosis not present

## 2018-08-30 ENCOUNTER — Other Ambulatory Visit: Payer: Self-pay | Admitting: Pulmonary Disease

## 2018-09-04 LAB — FUNGUS CULTURE W SMEAR
MICRO NUMBER: 91244051
SPECIMEN QUALITY: ADEQUATE

## 2018-09-04 LAB — RESPIRATORY CULTURE OR RESPIRATORY AND SPUTUM CULTURE
MICRO NUMBER:: 91244052
SPECIMEN QUALITY:: ADEQUATE

## 2018-09-05 DIAGNOSIS — K922 Gastrointestinal hemorrhage, unspecified: Secondary | ICD-10-CM | POA: Diagnosis not present

## 2018-09-05 DIAGNOSIS — S0181XD Laceration without foreign body of other part of head, subsequent encounter: Secondary | ICD-10-CM | POA: Diagnosis not present

## 2018-09-05 DIAGNOSIS — I2510 Atherosclerotic heart disease of native coronary artery without angina pectoris: Secondary | ICD-10-CM | POA: Diagnosis not present

## 2018-09-05 DIAGNOSIS — I5042 Chronic combined systolic (congestive) and diastolic (congestive) heart failure: Secondary | ICD-10-CM | POA: Diagnosis not present

## 2018-09-05 DIAGNOSIS — N183 Chronic kidney disease, stage 3 (moderate): Secondary | ICD-10-CM | POA: Diagnosis not present

## 2018-09-05 DIAGNOSIS — I130 Hypertensive heart and chronic kidney disease with heart failure and stage 1 through stage 4 chronic kidney disease, or unspecified chronic kidney disease: Secondary | ICD-10-CM | POA: Diagnosis not present
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
## 2018-09-08 ENCOUNTER — Encounter: Payer: Self-pay | Admitting: Cardiology

## 2018-09-08 ENCOUNTER — Ambulatory Visit (INDEPENDENT_AMBULATORY_CARE_PROVIDER_SITE_OTHER): Payer: Medicare Other | Admitting: Cardiology

## 2018-09-08 VITALS — BP 122/70 | HR 88 | Ht 71.0 in | Wt 170.2 lb

## 2018-09-08 DIAGNOSIS — I472 Ventricular tachycardia, unspecified: Secondary | ICD-10-CM

## 2018-09-08 DIAGNOSIS — I255 Ischemic cardiomyopathy: Secondary | ICD-10-CM

## 2018-09-08 DIAGNOSIS — I2510 Atherosclerotic heart disease of native coronary artery without angina pectoris: Secondary | ICD-10-CM

## 2018-09-08 DIAGNOSIS — Z95810 Presence of automatic (implantable) cardiac defibrillator: Secondary | ICD-10-CM | POA: Diagnosis not present

## 2018-09-08 DIAGNOSIS — Z79899 Other long term (current) drug therapy: Secondary | ICD-10-CM

## 2018-09-08 NOTE — Patient Instructions (Signed)
Medication Instructions:  RESTART - ASPIRIN 81 MG DAILY  If you need a refill on your cardiac medications before your next appointment, please call your pharmacy.  Labwork: CBC AND BMP  IN 1 MONTH HERE IN OUR OFFICE AT [facility]  Take the provided lab slips with you to the lab for your blood draw.     You will NOT need to fast   If you have labs (blood work) drawn today and your tests are completely normal, you will receive your results only by: Marland Kitchen MyChart Message (if you have MyChart) OR . A paper copy in the mail If you have any lab test that is abnormal or we need to change your treatment, we will call you to review the results.  Testing/Procedures: NONE ORDERED   Follow-Up: . You will need a follow up appointment in 1 [place].   At [facility], you and your health needs are our priority.  As part of our continuing mission to provide you with exceptional heart care, we have created designated Provider Care Teams.  These Care Teams include your primary Cardiologist (physician) and Advanced Practice Providers (APPs -  Physician Assistants and Nurse Practitioners) who all work together to provide you with the care you need, when you need it.  Thank you for choosing [facility]!!

## 2018-09-09 ENCOUNTER — Encounter: Payer: Self-pay | Admitting: Pulmonary Disease

## 2018-09-09 ENCOUNTER — Ambulatory Visit (INDEPENDENT_AMBULATORY_CARE_PROVIDER_SITE_OTHER): Payer: Medicare Other | Admitting: Pulmonary Disease

## 2018-09-09 VITALS — BP 106/70 | HR 83 | Temp 97.4°F | Ht 71.0 in | Wt 172.8 lb

## 2018-09-09 DIAGNOSIS — I10 Essential (primary) hypertension: Secondary | ICD-10-CM

## 2018-09-09 DIAGNOSIS — I5042 Chronic combined systolic (congestive) and diastolic (congestive) heart failure: Secondary | ICD-10-CM

## 2018-09-09 DIAGNOSIS — J9611 Chronic respiratory failure with hypoxia: Secondary | ICD-10-CM

## 2018-09-09 DIAGNOSIS — I255 Ischemic cardiomyopathy: Secondary | ICD-10-CM | POA: Diagnosis not present

## 2018-09-09 DIAGNOSIS — M150 Primary generalized (osteo)arthritis: Secondary | ICD-10-CM

## 2018-09-09 DIAGNOSIS — N183 Chronic kidney disease, stage 3 unspecified: Secondary | ICD-10-CM

## 2018-09-09 DIAGNOSIS — I712 Thoracic aortic aneurysm, without rupture, unspecified: Secondary | ICD-10-CM

## 2018-09-09 DIAGNOSIS — I2510 Atherosclerotic heart disease of native coronary artery without angina pectoris: Secondary | ICD-10-CM | POA: Diagnosis not present

## 2018-09-09 DIAGNOSIS — J449 Chronic obstructive pulmonary disease, unspecified: Secondary | ICD-10-CM

## 2018-09-09 DIAGNOSIS — C44219 Basal cell carcinoma of skin of left ear and external auricular canal: Secondary | ICD-10-CM

## 2018-09-09 DIAGNOSIS — Z95810 Presence of automatic (implantable) cardiac defibrillator: Secondary | ICD-10-CM | POA: Diagnosis not present

## 2018-09-09 DIAGNOSIS — M159 Polyosteoarthritis, unspecified: Secondary | ICD-10-CM

## 2018-09-09 DIAGNOSIS — Z9861 Coronary angioplasty status: Secondary | ICD-10-CM

## 2018-09-09 DIAGNOSIS — I739 Peripheral vascular disease, unspecified: Secondary | ICD-10-CM | POA: Diagnosis not present

## 2018-09-09 LAB — CUP PACEART REMOTE DEVICE CHECK
Battery Remaining Longevity: 89 mo
Battery Voltage: 3 V
Brady Statistic AP VS Percent: 84.39 %
Brady Statistic AS VP Percent: 0.03 %
Brady Statistic AS VS Percent: 15.51 %
Brady Statistic RA Percent Paced: 84.46 %
Date Time Interrogation Session: 20191010052303
HighPow Impedance: 35 Ohm
HighPow Impedance: 46 Ohm
Implantable Lead Implant Date: 20090623
Implantable Lead Model: 6947
Lead Channel Impedance Value: 285 Ohm
Lead Channel Pacing Threshold Amplitude: 0.875 V
Lead Channel Pacing Threshold Amplitude: 0.875 V
Lead Channel Pacing Threshold Pulse Width: 0.4 ms
Lead Channel Pacing Threshold Pulse Width: 0.4 ms
Lead Channel Sensing Intrinsic Amplitude: 0.75 mV
Lead Channel Setting Pacing Amplitude: 2.5 V
Lead Channel Setting Sensing Sensitivity: 0.6 mV
MDC IDC LEAD IMPLANT DT: 20090623
MDC IDC LEAD LOCATION: 753859
MDC IDC LEAD LOCATION: 753860
MDC IDC MSMT LEADCHNL RA IMPEDANCE VALUE: 361 Ohm
MDC IDC MSMT LEADCHNL RA SENSING INTR AMPL: 0.75 mV
MDC IDC MSMT LEADCHNL RV IMPEDANCE VALUE: 342 Ohm
MDC IDC MSMT LEADCHNL RV SENSING INTR AMPL: 12.875 mV
MDC IDC MSMT LEADCHNL RV SENSING INTR AMPL: 12.875 mV
MDC IDC PG IMPLANT DT: 20170616
MDC IDC SET LEADCHNL RA PACING AMPLITUDE: 2 V
MDC IDC SET LEADCHNL RV PACING PULSEWIDTH: 0.4 ms
MDC IDC STAT BRADY AP VP PERCENT: 0.07 %
MDC IDC STAT BRADY RV PERCENT PACED: 0.1 %

## 2018-09-09 NOTE — Patient Instructions (Signed)
Today we updated your med list in our EPIC system...     We decided to decrease your PREDNISONE slightly- from 10mg  each AM to 5mg  each AM...    Cut your current 10mg  tabs in half & take 1/2 tab each AM til return in Dec...  Good luck w/ the Dermatologist at the [facility] this week...  Call for any questions or if I can be of service in any way.Marland KitchenMarland Kitchen

## 2018-09-10 ENCOUNTER — Other Ambulatory Visit: Payer: Self-pay

## 2018-09-10 DIAGNOSIS — N183 Chronic kidney disease, stage 3 (moderate): Secondary | ICD-10-CM | POA: Diagnosis not present

## 2018-09-10 DIAGNOSIS — S0181XD Laceration without foreign body of other part of head, subsequent encounter: Secondary | ICD-10-CM | POA: Diagnosis not present

## 2018-09-10 DIAGNOSIS — I130 Hypertensive heart and chronic kidney disease with heart failure and stage 1 through stage 4 chronic kidney disease, or unspecified chronic kidney disease: Secondary | ICD-10-CM | POA: Diagnosis not present

## 2018-09-10 DIAGNOSIS — I5042 Chronic combined systolic (congestive) and diastolic (congestive) heart failure: Secondary | ICD-10-CM | POA: Diagnosis not present

## 2018-09-10 DIAGNOSIS — I2510 Atherosclerotic heart disease of native coronary artery without angina pectoris: Secondary | ICD-10-CM | POA: Diagnosis not present

## 2018-09-10 DIAGNOSIS — K922 Gastrointestinal hemorrhage, unspecified: Secondary | ICD-10-CM | POA: Diagnosis not present

## 2018-09-11 ENCOUNTER — Encounter: Payer: Self-pay | Admitting: Pulmonary Disease

## 2018-09-11 ENCOUNTER — Ambulatory Visit: Payer: BLUE CROSS/BLUE SHIELD | Admitting: Rheumatology

## 2018-09-11 ENCOUNTER — Ambulatory Visit (INDEPENDENT_AMBULATORY_CARE_PROVIDER_SITE_OTHER): Payer: Medicare Other

## 2018-09-11 DIAGNOSIS — I5042 Chronic combined systolic (congestive) and diastolic (congestive) heart failure: Secondary | ICD-10-CM | POA: Diagnosis not present

## 2018-09-11 DIAGNOSIS — C44219 Basal cell carcinoma of skin of left ear and external auricular canal: Secondary | ICD-10-CM | POA: Diagnosis not present

## 2018-09-11 DIAGNOSIS — Z95810 Presence of automatic (implantable) cardiac defibrillator: Secondary | ICD-10-CM | POA: Diagnosis not present

## 2018-09-11 NOTE — Progress Notes (Signed)
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 palpit/ etc;  BM improved (no current blood) w/ Miralax & reminded to take this regularly to decr his stool burden;  Given 2019 FLU vaccine today;  We plan recheck in 2 wks 08/11/18>   JackJaquaviusks better- Sput culture is still pending, on Doxy bid & Lasix20Qd w/ K10;  ICM monitoring getting to baseline, ankle edema slowly diminishing, he is resting better; ?if he is taking the Guaifenesin & rec to take 1-2 tabs twice daily w/ fluids... We  plan ROV recheck in another 2 weeks 08/26/18>   Multisystem dis- [place] are concerned about cough/ yellow sput/ not feeling well- decided to rx w/ Levaquin + Pred'10mg'$ /d, incr mucinex600-2Bid. 09/08/18>   We  decided to decr the PRED10 to 1/2 tab Qam til return; he is awaiting DERM appt at the skin surg center this week   GOLD stage3 COPD>  he is congratulated on quitting smoking completely & remaining off cigs; Advised to stay on the ICS/LABA (Advair Bid) regularly, he does not want additional meds...  05/04/16>  Continue the ADVAIR250Bid, Mucinex600-2Bid 12/2016>  He was hosp w/ ac on chr sys&diast CHF + COPD exac; treated w/ Doxy, Pred, Nebs and improved... 09/18/17>   We decided to add [id number]- 2spBid & MUCINEX600- 2Bid, concentrate on good deep breaths and vigorous cough to expectorate any phlegm from his airways;  Continue other meds regularly & active f/u from [place];  He may need help/ supervision w/ his meds (from family)  Periph Vasc Dis>  Followed by [facility], DrDickson & CDoppler 8/15 showed patent right CAE site w/ <40% left ICA stenosis & they are following; fusiform dilatation of the AscAo at 4.1cm, prev AAA repair w/ Ao-biiliac graft w/ common fem & bilat uperfic femoral stenoses on CT Abd 03/2016...  CHOL>  FLP looked good on Simva40 & Feno160, the latter was stopped by DrHochrein; the Simva40 is changed to PRAV40 04/2016 due to [facility] Rx...  GI/ Dysphagia>  He notes symptoms resolved spont & he does not believe that he has a problem in this area, declines PPI Rx or GI eval...  LBP w/ compression fx T12 in 2009 w/ vertebroplasty>>  FALL 4/12 w/ signif trauma & right rib fxs>   OSTEOPOROSIS>  On calcium, MVI, VitD 2000u/d;  F/u BMD -3.2 in R FemNeck & Alendronate70/wk started 8/15 but pt didn't stick w/ this med & stopped on his own... New part compression T10 found 8/15 on routine CXR & L2/ L3 on CT Lumbar spine 10/18> he declined to restart Alendronate or consider alternative therapy... Subseq part compression L2 & mod compression L3 evaluated by Shara Blazing 08/2017 => pain rx, PT, refer to Rheum for more aggressive management of osteoporosis... On FOSAMAX70/wk + dietary calcium, MVI,  VitD & weight bearing exercise... 02/10/18>   F/u BMD showed lowest Tscore -2.7 in right [facility] & pt is rec to continue all meds including the fosamax70/wk...   Hx SHINGLES superimposed on his Seborrheic Dermatitis>> presented 05/2017 w/ left C2 distrib shingles... BASAL CELL Ca removed from behind his left pinna by DrByers 11/2015=> pt did not follow up & DrHall Dx recurrent BCCa 2019 & referred to [place], pt awaiting appt...  05/27/17>   Ulmer has a signif shingles outbreak over the left C2 distrib although I cannot r/o some Trigem nerve overlap here;  He needs to continue the Keflex Bid & ValtrexTid til gone, and we will start a Pred taper for the amt of imflamm present;  His skin/ scalp needs attention from his severe seb derm + the njew shingles outbreak> I suggest that he get in the shower/ tub Bid & gently wash/ soak the area on his neck & scalp, then gently use a washcloth to remove dead skin & clean the area; pat dry w/ clean towels; then he can apply the salve provided by Derm (topical lidocaine gel) if needed for pain... He needs to have this checked again in about 1wk by myself or Derm & have his Seb  Dermatitis addressed more vigorously + prob excision of the left leg lesion 06/18/17>   Greco has a combination of seb dermatitis on scalp/ face w/ flaking etc, and shingles/ post herpetic neuralgia involving mainly left C2 distrib;  We reviewed Tramadol/ Tylenol for pain + try Lotrosone cream for the seb derm- he really needs to work on tis to clean it up   Patient's Medications  New Prescriptions   No medications on file  Previous Medications   ALBUTEROL (PROVENTIL HFA;VENTOLIN HFA) 108 (90 BASE) MCG/ACT INHALER    Inhale 2 puffs into the lungs every 6 (six) hours as needed for wheezing or shortness of breath.   ALENDRONATE (FOSAMAX) 70 MG TABLET    TAKE 1 TAB ONCE A WEEK, AT LEAST 30 MIN BEFORE 1ST FOOD.DO NOT LIE DOWN FOR 30 MIN AFTER TAKING.   ALLOPURINOL (ZYLOPRIM) 100 MG TABLET     Take 1 tablet (100 mg total) by mouth daily.   AMIODARONE (PACERONE) 200 MG TABLET    TAKE 1 TABLET ONCE DAILY.   ASPIRIN EC 81 MG TABLET    Take 81 mg by mouth daily.   CARVEDILOL (COREG) 3.125 MG TABLET    TAKE 1 TABLET BY MOUTH TWICE DAILY WITH A MEAL.   CHOLECALCIFEROL (VITAMIN D) 1000 UNITS TABLET    Take 1,000 Units by mouth daily.   FLUTICASONE (CUTIVATE) 0.05 % CREAM    Apply 1 application topically 2 (two) times daily.    FLUTICASONE-SALMETEROL (ADVAIR HFA) 115-21 MCG/ACT INHALER    Inhale 2 puffs into the lungs 2 (two) times daily.   FUROSEMIDE (LASIX) 20 MG TABLET    Take 1 tablet (20 mg total) by mouth daily.   GUAIFENESIN (MUCINEX) 600 MG 12 HR TABLET    Take 600 mg by mouth 2 (two) times daily.    LEVOTHYROXINE (SYNTHROID, LEVOTHROID) 50 MCG TABLET    Take 50 mcg by mouth daily before breakfast.   MAGNESIUM OXIDE (MAG-OX) 400 (241.3 MG) MG TABLET    Take 0.5 tablets (200 mg total) by mouth daily.   MIDODRINE (PROAMATINE) 2.5 MG TABLET    TAKE 1 TABLET 3 TIMES DAILY WITH MEALS.   MULTIPLE VITAMINS-MINERALS (PRESERVISION AREDS 2 PO)    Take 1 capsule by mouth daily.   OXYBUTYNIN (DITROPAN) 5 MG TABLET    TAKE 1 TABLET BY MOUTH TWICE DAILY.   POLYETHYLENE GLYCOL POWDER (QC NATURA-LAX) POWDER    1 capful in 8oz water twice daily   POTASSIUM CHLORIDE (K-DUR) 10 MEQ TABLET    Take 1 tablet (10 mEq total) by mouth daily.   PRAVASTATIN (PRAVACHOL) 40 MG TABLET    TAKE 1 TABLET ONCE DAILY.   PREDNISONE (DELTASONE) 5 MG TABLET    Take 5 mg by mouth daily with breakfast. Take 1/2 ('10mg'$ ) tab   SERTRALINE (ZOLOFT) 50 MG TABLET    TAKE ONE TABLET AT BEDTIME.   THIAMINE 100 MG TABLET    Take 1 tablet (100 mg total) by mouth daily.   TRAMADOL (ULTRAM) 50 MG TABLET    Take 1 tablet (50 mg total) by mouth every 6 (six) hours as needed for severe pain.  Modified Medications   No medications on file  Discontinued Medications   LEVOFLOXACIN (LEVAQUIN) 750 MG TABLET    Take 1 tablet (750 mg total) by  mouth daily.   MULTIPLE VITAMIN (MULTIVITAMIN) CAPSULE    Take 1 capsule by mouth daily.   PREDNISONE (DELTASONE) 10 MG TABLET    Take 1 tablet (  10 mg total) by mouth daily with breakfast.

## 2018-09-12 NOTE — Progress Notes (Signed)
EPIC Encounter for ICM Monitoring  Patient Name: [name] is a 84 y.o. male Date: 09/12/2018 Primary Care Physican: Noralee Space, MD Primary Cardiologist: [place] Electrophysiologist: Faustino Congress Weight:unknown  Attempted call todaughter [name]. Left detailed message per DPR.  Transmission reviewed.   Thoracic impedance normal.   Prescribed: Furosemide20 mg 1 tabletdaily.Potassium 10 mEq 1 tabletdaily.   Labs: 07/24/2018 Creatinine1.03, BUN17, Potassium3.5, Sodium133, eGFR>60 07/23/2018 Creatinine1.10, BUN20, Potassium4.1, Sodium134, eGFR59->60  07/22/2018 Creatinine1.09, BUN28, Potassium4.1, Sodium135, eGFR>60  07/21/2018 Creatinine1.40, BUN30, Potassium4.4, Sodium140, 5:42 AM  07/21/2018 Creatinine1.36, BUN28, Potassium4.4, Sodium138, XVEZ50-15 02/13/2018 Creatinine1.15, BUN24, Potassium5.1, Sodium138, AEWY57-49 02/10/2018 Creatinine0.98, BUN23, Potassium4.0, Sodium134, EGFR>60 12/16/2017 Creatinine 0.90, BUN 17, Potassium 4.4, Sodium 138 11/13/2017 Creatinine 0.92, BUN 16, Potassium 4.3, Sodium 136, EGFR 76-88 10/30/2017 Creatinine 1.06, BUN 14, Potassium 4.6, Sodium 134, EGFR 64-74 A complete set of results can be found in Results Review.  Recommendations: Left voice mail with ICM number and encouraged to call if experiencing any fluid symptoms.  Follow-up plan: ICM clinic phone appointment on 10/13/2018.   Office appointment scheduled 10/10/2018 with Dr. Percival Spanish.    Copy of ICM check sent to Dr. Caryl Comes.   3 month ICM trend: 09/11/2018    1 Year ICM trend:       [name], RN 09/12/2018 1:57 PM

## 2018-09-14 ENCOUNTER — Other Ambulatory Visit: Payer: Self-pay | Admitting: Rheumatology

## 2018-09-15 NOTE — Telephone Encounter (Signed)
ok 

## 2018-09-15 NOTE — Telephone Encounter (Signed)
Last Visit: 03/13/2018 Next Visit: 10/07/2018 Labs: 08/21/2018 RBC 3.75, hemoglobin 11.6, platelets 131, GFR 55, calcium 9.2. Previously 07/24/2018 RBC 3.00, hemoglobin 9.8, platelets 131, GFR 59, calcium 7.7.  Okay to refill fosamax?

## 2018-09-17 DIAGNOSIS — I5042 Chronic combined systolic (congestive) and diastolic (congestive) heart failure: Secondary | ICD-10-CM | POA: Diagnosis not present

## 2018-09-17 DIAGNOSIS — S0181XD Laceration without foreign body of other part of head, subsequent encounter: Secondary | ICD-10-CM | POA: Diagnosis not present

## 2018-09-17 DIAGNOSIS — N183 Chronic kidney disease, stage 3 (moderate): Secondary | ICD-10-CM | POA: Diagnosis not present

## 2018-09-17 DIAGNOSIS — I2510 Atherosclerotic heart disease of native coronary artery without angina pectoris: Secondary | ICD-10-CM | POA: Diagnosis not present

## 2018-09-17 DIAGNOSIS — K922 Gastrointestinal hemorrhage, unspecified: Secondary | ICD-10-CM | POA: Diagnosis not present

## 2018-09-17 DIAGNOSIS — I130 Hypertensive heart and chronic kidney disease with heart failure and stage 1 through stage 4 chronic kidney disease, or unspecified chronic kidney disease: Secondary | ICD-10-CM | POA: Diagnosis not present

## 2018-09-23 NOTE — Progress Notes (Signed)
Office Visit Note  Patient: [name]             Date of Birth: Sep 05, 1931           MRN: [id number]             PCP: Noralee Space, MD Referring: Noralee Space, MD Visit Date: 10/07/2018 Occupation: @GUAROCC @  Subjective:  Medication management . History of Present Illness: [name] is a 84 y.o. male with history of osteoporosis and gout.  Denies any gout flare since the last visit.  He states that he has been tolerating Fosamax well.  He denies any joint pain.  He states he is on low-dose prednisone for COPD by Dr. Lenna Gilford.  Activities of Daily Living:  Patient reports morning stiffness for 0 minute.   Patient Denies nocturnal pain.  Difficulty dressing/grooming: Denies Difficulty climbing stairs: Reports Difficulty getting out of chair: Reports Difficulty using hands for taps, buttons, cutlery, and/or writing: Denies  Review of Systems  Constitutional: Positive for fatigue. Negative for night sweats.  HENT: Positive for mouth dryness. Negative for mouth sores and nose dryness.   Eyes: Negative for redness and dryness.  Respiratory: Positive for shortness of breath. Negative for difficulty breathing.   Cardiovascular: Negative for chest pain, palpitations, hypertension, irregular heartbeat and swelling in legs/feet.  Gastrointestinal: Negative for constipation and diarrhea.  Endocrine: Negative for increased urination.  Musculoskeletal: Negative for arthralgias, joint pain, joint swelling, myalgias, muscle weakness, morning stiffness, muscle tenderness and myalgias.  Skin: Negative for color change, rash, hair loss, nodules/bumps, skin tightness, ulcers and sensitivity to sunlight.  Allergic/Immunologic: Negative for susceptible to infections.  Neurological: Negative for dizziness, fainting, memory loss, night sweats and weakness ( ).  Hematological: Negative for swollen glands.  Psychiatric/Behavioral: Negative for depressed mood and sleep disturbance. The patient is  not nervous/anxious.     PMFS History:  Patient Active Problem List   Diagnosis Date Noted  . Chronic hypoxemic respiratory failure ([place]) 08/26/2018  . Gait abnormality 08/26/2018  . Lobar pneumonia, unspecified organism ([place]) 08/04/2018  . Lower GI bleeding 07/21/2018  . Forehead laceration, initial encounter 07/21/2018  . CKD (chronic kidney disease) stage 3, GFR 30-59 ml/min (HCC) 07/21/2018  . History of pneumonia 07/15/2018  . Physical deconditioning 11/13/2017  . Weakness 11/12/2017  . Falls 11/12/2017  . Pleural effusion   . Pressure injury of skin 10/01/2017  . CHF exacerbation ([place]) 09/30/2017  . Shingles outbreak 05/27/2017  . Seborrheic dermatitis 05/27/2017  . Acute on chronic respiratory failure with hypoxia ([place]) 01/13/2017  . COPD with acute bronchitis ([place]) 01/13/2017  . CAD S/P percutaneous coronary angioplasty   . Chronic combined systolic and diastolic CHF (congestive heart failure) ([place])   . Blunt chest trauma 05/04/2016  . 4.1 cm Aneurysm of thoracic aorta ([place]) 04/27/2016  . SIADH (syndrome of inappropriate ADH production) ([place])   . Orthostatic hypotension 04/12/2016  . TBI (traumatic brain injury) ([place])   . Acute on chronic combined systolic and diastolic congestive heart failure ([place])   . S/P ORIF (open reduction internal fixation) fracture   . Thrombocytopenia ([place])   . Urinary retention   . Slow transit constipation   . Thyroid activity decreased   . Traumatic subdural hematoma with loss of consciousness ([place])   . Pulmonary hypertension ([place])   . Cardiomyopathy, ischemic   . Subdural hematoma ([place])   . Cardiac arrest ([place]) 03/29/2016  . 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  CAP (community acquired pneumonia) 04/28/2015  . T wave over sensing resulting in inappropriate shocks 08/14/2013  . Vitamin D deficiency disease 04/09/2012  . Actinic skin damage 05/22/2011  . Alcohol use 03/22/2011  . Other dysphagia 02/16/2011  . Carotid  arterial disease ([place]) 11/17/2009  . GOUT 09/23/2009  . Age-related osteoporosis with current pathological fracture 09/23/2009  . SYNCOPE 04/19/2009  . Closed fracture of bone 07/27/2008  . Automatic implantable cardioverter-defibrillator in situ 04/13/2008  . COLONIC POLYPS 12/09/2007  . Diverticulosis of colon 12/09/2007  . Essential hypertension 12/08/2007  . Peripheral vascular disease ([place]) 12/08/2007  . COPD GOLD III 12/08/2007  . Osteoarthritis 12/08/2007    Past Medical History:  Diagnosis Date  . AAA (abdominal aortic aneurysm) ([place])    a. 2002 s/p repair.  . Allergic rhinitis   . Back pain   . CAD (coronary artery disease)    a. 02/2002 H/o MI with stenting x 2; b. 04/2013 MV: EF 25%, large posterior lateral infarct w/o ischemia; c. 03/2016 VT Arrest/Cath: LM 60ost, LAD 40p/m ISR, LCX 100p/m, RCA nl-->Med Rx.  . Carotid arterial disease ([place])    a. 12/2010 s/p R CEA;  b. 05/2015 Carotid U/S: bilat <40% ICA stenosis.  . Chronic combined systolic and diastolic CHF (congestive heart failure) ([place])    a. 03/2016 Echo: Ef 35-40%, grade 1 DD.  Marland Kitchen Compression fracture   . COPD (chronic obstructive pulmonary disease) ([place])   . Dermatitis   . Diverticulosis of colon   . DJD (degenerative joint disease)    and Gout  . Hemorrhoids   . History of colonic polyps   . History of gout   . History of pneumonia   . Hypercholesterolemia   . Hypertension   . Hypertensive heart disease   . Ischemic cardiomyopathy    a. EF prev <35%-->improved to normal by Echo 8/14:  Mild LVH, focal basal hypertrophy, EF 60-65%, normal wall motion, mild BAE, PASP 36; c. 03/2016 Echo: EF 35-40%, Gr1 DD, triv AI, mild MR, mod dil LA, mild-mod TR, PASP 39mmHg.  . Osteoporosis   . Peripheral vascular disease ([place])   . Presence of cardiac defibrillator    a. 03/2008 s/p MDT [id number] Maximo II DR, DC AICD; b. 03/2013: ICD shock for T wave oversensing;  c. 10/2015: collective decision not to replace ICD given  improvement in LV fxn; d. VT Arrest-->Gen change to MDT ser # [id number].  . Skin cancer    shoulders and forehead  . Syncope   . T wave over sensing resulting in inappropriate shocks    a. 03/2013.  . Tobacco abuse   . Ventricular tachycardia ([place]) 04/01/2016    Family History  Problem Relation Age of Onset  . Hypertension Father   . Heart disease Father        Heart Disease before age 3  . Hypertension Mother   . Heart disease Mother        Heart Disease before age 75  . Cancer Mother   . Scoliosis Sister   . Thyroid disease Daughter   . Anxiety disorder Son    Past Surgical History:  Procedure Laterality Date  . ABDOMINAL AORTIC ANEURYSM REPAIR  2002   by Dr. Kellie Simmering  . aicd placed  03/2008   Dr. Caryl Comes  . cad stent  02/2002   Dr. Percival Spanish  . CARDIAC CATHETERIZATION     X 2 stents  . CARDIAC CATHETERIZATION N/A 04/03/2016   Procedure: Left Heart Cath and Coronary  Angiography;  Surgeon: Belva Crome, MD;  Location: [facility];  Service: Cardiovascular;  Laterality: N/A;  . CARDIAC DEFIBRILLATOR PLACEMENT  2009  . CARDIAC DEFIBRILLATOR PLACEMENT    . CAROTID ENDARTERECTOMY Right January 02, 2011  . EAR CYST EXCISION Left 12/13/2015   Procedure: Excision left ear lesion ;  Surgeon: Melissa Montane, MD;  Location: [facility];  Service: ENT;  Laterality: Left;  . EP IMPLANTABLE DEVICE N/A 04/06/2016   Procedure:  ICD Generator Changeout;  Surgeon: Deboraha Sprang, MD;  Location: [facility];  Service: Cardiovascular;  Laterality: N/A;  . EXCISION OF LESION LEFT EAR Left 12/13/2015  . EXTERNAL EAR SURGERY Left    Cancer Removal from left ear  . I&D EXTREMITY Left 04/23/2016   Procedure: IRRIGATION AND DEBRIDEMENT HAND;  Surgeon: Leandrew Koyanagi, MD;  Location: [facility];  Service: Orthopedics;  Laterality: Left;  . IR IMAGE GUIDED DRAINAGE BY PERCUTANEOUS CATHETER  10/08/2017  . OPEN REDUCTION INTERNAL FIXATION (ORIF) METACARPAL Left 04/04/2016   Procedure: OPEN REDUCTION INTERNAL  FIXATION (ORIF) LEFT 2ND, 3RD, 4TH METACARPAL FRACTURE;  Surgeon: Leandrew Koyanagi, MD;  Location: [facility];  Service: Orthopedics;  Laterality: Left;  OPEN REDUCTION INTERNAL FIXATION (ORIF) LEFT 2ND, 3RD, 4TH METACARPAL FRACTURE  . OPEN REDUCTION INTERNAL FIXATION (ORIF) METACARPAL Left 04/23/2016   Procedure: REVISION OPEN REDUCTION INTERNAL FIXATION (ORIF) 2ND METACARPAL;  Surgeon: Leandrew Koyanagi, MD;  Location: [facility];  Service: Orthopedics;  Laterality: Left;  . right corotid enderectomy  12/2010   Dr. Scot Dock  . SKIN SPLIT GRAFT Left 12/13/2015   Procedure: with possible skin graft;  Surgeon: Melissa Montane, MD;  Location: [facility];  Service: ENT;  Laterality: Left;  . TONSILLECTOMY     Social History   Social History Narrative   ** Merged History Encounter **       Lives locally.  Had been living with wife who had become quite ill recently and died on the evening of 2016/04/06.    Objective: Vital Signs: BP (!) 82/55 (BP Location: Left Arm, Patient Position: Sitting, Cuff Size: Large)   Pulse 67   Resp 14   Ht 5\' 11"  (1.803 m)   Wt 176 lb 9.6 oz (80.1 kg)   BMI 24.63 kg/m    Physical Exam Vitals signs and nursing note reviewed.  Constitutional:      Appearance: He is well-developed.  HENT:     Head: Normocephalic and atraumatic.  Eyes:     Conjunctiva/sclera: Conjunctivae normal.     Pupils: Pupils are equal, round, and reactive to light.  Neck:     Musculoskeletal: Normal range of motion and neck supple.  Cardiovascular:     Rate and Rhythm: Normal rate and regular rhythm.     Heart sounds: Normal heart sounds.     Comments: Defibrillator and pacemaker Pulmonary:     Effort: Pulmonary effort is normal.     Breath sounds: Normal breath sounds.  Abdominal:     General: Bowel sounds are normal.     Palpations: Abdomen is soft.  Lymphadenopathy:     Cervical: No cervical adenopathy.  Skin:    General: Skin is warm and dry.     Capillary Refill: Capillary refill takes less than 2  seconds.  Neurological:     Mental Status: He is alert and oriented to person, place, and time.  Psychiatric:        Behavior: Behavior normal.      Musculoskeletal  Exam: C-spine good range of motion.  He has thoracic kyphosis.  Shoulder joints elbow joints wrist joint MCPs PIPs DIPs been good range of motion with no synovitis.  Hip joints knee joints ankles MTPs PIPs with good range of motion with no synovitis.  CDAI Exam: CDAI Score: Not documented Patient Global Assessment: Not documented; Provider Global Assessment: Not documented Swollen: Not documented; Tender: Not documented Joint Exam   Not documented   There is currently no information documented on the homunculus. Go to the Rheumatology activity and complete the homunculus joint exam.  Investigation: No additional findings.  Imaging: No results found.  Recent Labs: Lab Results  Component Value Date   WBC 7.1 08/21/2018   HGB 11.6 (L) 08/21/2018   PLT 131 (L) 08/21/2018   NA 139 08/21/2018   K 5.2 08/21/2018   CL 100 08/21/2018   CO2 23 08/21/2018   GLUCOSE 90 08/21/2018   BUN 17 08/21/2018   CREATININE 1.19 08/21/2018   BILITOT 1.0 07/23/2018   ALKPHOS 60 07/23/2018   AST 35 07/23/2018   ALT 28 07/23/2018   PROT 5.3 (L) 07/23/2018   ALBUMIN 2.5 (L) 07/23/2018   CALCIUM 9.2 08/21/2018   GFRAA 64 08/21/2018    Speciality Comments: No specialty comments available.  Procedures:  No procedures performed Allergies: Lisinopril   Assessment / Plan:     Visit Diagnoses: Age-related osteoporosis with current pathological fracture with routine healing, subsequent encounter - History of T12 compression fracture in 1999.  L3 lumbar vertebral compression fracture.  He is on Fosamax 70 mg p.o. weekly. DXA 2019 T -2.7 increased by 11.4%.  He is tolerating medication well.  His labs have been stable.  He will continue on Fosamax.  Vitamin D deficiency disease-he is taking calcium and vitamin D.  Idiopathic chronic  gout of multiple sites without tophus-he has been on allopurinol per Dr. Lenna Gilford.  Chronic systemic steroid treatment-he is on low-dose prednisone for COPD.  Other medical problems are listed as follows:  History of COPD  Former smoker  Automatic implantable cardioverter-defibrillator in situ  History of diverticulosis  COPD GOLD III  Peripheral vascular disease ([place])  SIADH (syndrome of inappropriate ADH production) (HCC)  Basal cell carcinoma (B[facility]) of auricle of left ear  Closed compression fracture of L3 lumbar vertebra, sequela  CAD S/P percutaneous coronary angioplasty  Chronic combined systolic and diastolic CHF (congestive heart failure) (HCC)  Cardiomyopathy, ischemic  Pulmonary hypertension (HCC)  Essential hypertension  History of subdural hematoma  History of traumatic brain injury  Acute on chronic combined systolic and diastolic congestive heart failure ([place])   Orders: No orders of the defined types were placed in this encounter.  No orders of the defined types were placed in this encounter.    Follow-Up Instructions: Return for Osteoporosis, Gout.   Bo Merino, MD  Note - This record has been created using Editor, commissioning.  Chart creation errors have been sought, but may not always  have been located. Such creation errors do not reflect on  the standard of medical care.

## 2018-09-24 LAB — AFB CULTURE WITH SMEAR (NOT AT ARMC)
Acid Fast Culture: NEGATIVE
Acid Fast Smear: NEGATIVE

## 2018-09-24 LAB — SPECIMEN STATUS REPORT

## 2018-10-05 ENCOUNTER — Other Ambulatory Visit: Payer: Self-pay | Admitting: Cardiology

## 2018-10-07 ENCOUNTER — Ambulatory Visit (INDEPENDENT_AMBULATORY_CARE_PROVIDER_SITE_OTHER): Payer: Medicare Other | Admitting: Rheumatology

## 2018-10-07 ENCOUNTER — Encounter: Payer: Self-pay | Admitting: Rheumatology

## 2018-10-07 VITALS — BP 82/55 | HR 67 | Resp 14 | Ht 71.0 in | Wt 176.6 lb

## 2018-10-07 DIAGNOSIS — I272 Pulmonary hypertension, unspecified: Secondary | ICD-10-CM

## 2018-10-07 DIAGNOSIS — I739 Peripheral vascular disease, unspecified: Secondary | ICD-10-CM

## 2018-10-07 DIAGNOSIS — C44219 Basal cell carcinoma of skin of left ear and external auricular canal: Secondary | ICD-10-CM

## 2018-10-07 DIAGNOSIS — I5043 Acute on chronic combined systolic (congestive) and diastolic (congestive) heart failure: Secondary | ICD-10-CM

## 2018-10-07 DIAGNOSIS — E559 Vitamin D deficiency, unspecified: Secondary | ICD-10-CM | POA: Diagnosis not present

## 2018-10-07 DIAGNOSIS — Z87891 Personal history of nicotine dependence: Secondary | ICD-10-CM

## 2018-10-07 DIAGNOSIS — Z8709 Personal history of other diseases of the respiratory system: Secondary | ICD-10-CM

## 2018-10-07 DIAGNOSIS — J449 Chronic obstructive pulmonary disease, unspecified: Secondary | ICD-10-CM | POA: Diagnosis not present

## 2018-10-07 DIAGNOSIS — M1A09X0 Idiopathic chronic gout, multiple sites, without tophus (tophi): Secondary | ICD-10-CM

## 2018-10-07 DIAGNOSIS — E222 Syndrome of inappropriate secretion of antidiuretic hormone: Secondary | ICD-10-CM

## 2018-10-07 DIAGNOSIS — M8000XD Age-related osteoporosis with current pathological fracture, unspecified site, subsequent encounter for fracture with routine healing: Secondary | ICD-10-CM

## 2018-10-07 DIAGNOSIS — Z8679 Personal history of other diseases of the circulatory system: Secondary | ICD-10-CM

## 2018-10-07 DIAGNOSIS — S32030S Wedge compression fracture of third lumbar vertebra, sequela: Secondary | ICD-10-CM

## 2018-10-07 DIAGNOSIS — Z87820 Personal history of traumatic brain injury: Secondary | ICD-10-CM

## 2018-10-07 DIAGNOSIS — Z95810 Presence of automatic (implantable) cardiac defibrillator: Secondary | ICD-10-CM

## 2018-10-07 DIAGNOSIS — I5042 Chronic combined systolic (congestive) and diastolic (congestive) heart failure: Secondary | ICD-10-CM

## 2018-10-07 DIAGNOSIS — I255 Ischemic cardiomyopathy: Secondary | ICD-10-CM

## 2018-10-07 DIAGNOSIS — Z9861 Coronary angioplasty status: Secondary | ICD-10-CM

## 2018-10-07 DIAGNOSIS — Z8719 Personal history of other diseases of the digestive system: Secondary | ICD-10-CM | POA: Diagnosis not present

## 2018-10-07 DIAGNOSIS — I10 Essential (primary) hypertension: Secondary | ICD-10-CM

## 2018-10-07 DIAGNOSIS — I2510 Atherosclerotic heart disease of native coronary artery without angina pectoris: Secondary | ICD-10-CM

## 2018-10-08 ENCOUNTER — Encounter: Payer: Self-pay | Admitting: Pulmonary Disease

## 2018-10-08 ENCOUNTER — Ambulatory Visit (INDEPENDENT_AMBULATORY_CARE_PROVIDER_SITE_OTHER): Payer: Medicare Other | Admitting: Pulmonary Disease

## 2018-10-08 VITALS — BP 124/78 | HR 86 | Temp 97.3°F | Ht 71.0 in | Wt 167.8 lb

## 2018-10-08 DIAGNOSIS — Z95810 Presence of automatic (implantable) cardiac defibrillator: Secondary | ICD-10-CM

## 2018-10-08 DIAGNOSIS — J9611 Chronic respiratory failure with hypoxia: Secondary | ICD-10-CM

## 2018-10-08 DIAGNOSIS — I712 Thoracic aortic aneurysm, without rupture, unspecified: Secondary | ICD-10-CM

## 2018-10-08 DIAGNOSIS — Z9861 Coronary angioplasty status: Secondary | ICD-10-CM

## 2018-10-08 DIAGNOSIS — N183 Chronic kidney disease, stage 3 unspecified: Secondary | ICD-10-CM

## 2018-10-08 DIAGNOSIS — I10 Essential (primary) hypertension: Secondary | ICD-10-CM | POA: Diagnosis not present

## 2018-10-08 DIAGNOSIS — M150 Primary generalized (osteo)arthritis: Secondary | ICD-10-CM | POA: Diagnosis not present

## 2018-10-08 DIAGNOSIS — I5042 Chronic combined systolic (congestive) and diastolic (congestive) heart failure: Secondary | ICD-10-CM | POA: Diagnosis not present

## 2018-10-08 DIAGNOSIS — I255 Ischemic cardiomyopathy: Secondary | ICD-10-CM

## 2018-10-08 DIAGNOSIS — I739 Peripheral vascular disease, unspecified: Secondary | ICD-10-CM | POA: Diagnosis not present

## 2018-10-08 DIAGNOSIS — C44219 Basal cell carcinoma of skin of left ear and external auricular canal: Secondary | ICD-10-CM

## 2018-10-08 DIAGNOSIS — I2510 Atherosclerotic heart disease of native coronary artery without angina pectoris: Secondary | ICD-10-CM

## 2018-10-08 DIAGNOSIS — J449 Chronic obstructive pulmonary disease, unspecified: Secondary | ICD-10-CM | POA: Diagnosis not present

## 2018-10-08 DIAGNOSIS — M159 Polyosteoarthritis, unspecified: Secondary | ICD-10-CM

## 2018-10-08 LAB — COMPREHENSIVE METABOLIC PANEL
ALT: 36 U/L (ref 0–53)
AST: 41 U/L — ABNORMAL HIGH (ref 0–37)
Albumin: 3.8 g/dL (ref 3.5–5.2)
Alkaline Phosphatase: 71 U/L (ref 39–117)
BUN: 26 mg/dL — ABNORMAL HIGH (ref 6–23)
CO2: 33 mEq/L — ABNORMAL HIGH (ref 19–32)
Calcium: 9.2 mg/dL (ref 8.4–10.5)
Chloride: 99 mEq/L (ref 96–112)
Creatinine, Ser: 1.17 mg/dL (ref 0.40–1.50)
GFR: 62.88 mL/min (ref >60.00)
GLUCOSE: 89 mg/dL (ref 70–99)
Potassium: 4.5 mEq/L (ref 3.5–5.1)
Sodium: 138 mEq/L (ref 135–145)
Total Bilirubin: 0.5 mg/dL (ref 0.2–1.2)
Total Protein: 7.3 g/dL (ref 6.0–8.3)

## 2018-10-08 LAB — CBC WITH DIFFERENTIAL/PLATELET
Basophils Absolute: 0.1 10*3/uL (ref 0.0–0.1)
Basophils Relative: 0.7 % (ref 0.0–3.0)
EOS ABS: 0.6 10*3/uL (ref 0.0–0.7)
Eosinophils Relative: 7.3 % — ABNORMAL HIGH (ref 0.0–5.0)
HCT: 38.6 % — ABNORMAL LOW (ref 39.0–52.0)
HEMOGLOBIN: 12.7 g/dL — AB (ref 13.0–17.0)
Lymphocytes Relative: 17.3 % (ref 12.0–46.0)
Lymphs Abs: 1.5 10*3/uL (ref 0.7–4.0)
MCHC: 32.9 g/dL (ref 30.0–36.0)
MCV: 89.2 fl (ref 78.0–100.0)
MONO ABS: 0.7 10*3/uL (ref 0.1–1.0)
Monocytes Relative: 7.7 % (ref 3.0–12.0)
Neutro Abs: 5.9 10*3/uL (ref 1.4–7.7)
Neutrophils Relative %: 67 % (ref 43.0–77.0)
Platelets: 138 10*3/uL — ABNORMAL LOW (ref 150.0–400.0)
RBC: 4.33 Mil/uL (ref 4.22–5.81)
RDW: 14.9 % (ref 11.5–15.5)
WBC: 8.8 10*3/uL (ref 4.0–10.5)

## 2018-10-08 LAB — SEDIMENTATION RATE: Sed Rate: 27 mm/hr — ABNORMAL HIGH (ref 0–20)

## 2018-10-08 LAB — BRAIN NATRIURETIC PEPTIDE: Pro B Natriuretic peptide (BNP): 940 pg/mL — ABNORMAL HIGH (ref 0.0–100.0)

## 2018-10-08 NOTE — Patient Instructions (Signed)
Today we updated your med list in our EPIC system...    Continue your current medications the same...  Today we checked your follow up blood work (this includes the labs that [facility] wanted done)...    We will contact you w/ the results when available...   We discussed my up-coming retirement & we will arrange for follow up visits with>>    Primary Care- hoping to get you an appt at the [facility] office or the [facility] facility in about 67month.    Pulmonary- we will arrange for a follow up appt w/ my young partner DrIcard in about 2 months time...  Meko, it has been my honor to have been your doctor over these many years!    Wishing you & your family a very merry Christmas & a happy & healthy new year!!

## 2018-10-09 NOTE — Telephone Encounter (Signed)
New message     *STAT* If patient is at the pharmacy, call can be transferred to refill team.   1. Which medications need to be refilled? (please list name of each medication and dose if known) Carvedilol 3.125 mg, Midodrine 2.5 mg  2. Which pharmacy/location (including street and city if local pharmacy) is medication to be sent to? [facility]  3. Do they need a 30 day or 90 day supply? 47 day   [facility] said these request were faxed on 12.15 and 12.17, he was calling to find out if they were going to be filled or denied.

## 2018-10-10 ENCOUNTER — Ambulatory Visit (INDEPENDENT_AMBULATORY_CARE_PROVIDER_SITE_OTHER): Payer: Medicare Other | Admitting: Physician Assistant

## 2018-10-10 ENCOUNTER — Encounter: Payer: Self-pay | Admitting: Physician Assistant

## 2018-10-10 VITALS — BP 110/70 | HR 86 | Ht 71.0 in | Wt 173.2 lb

## 2018-10-10 DIAGNOSIS — I255 Ischemic cardiomyopathy: Secondary | ICD-10-CM

## 2018-10-10 DIAGNOSIS — Z9889 Other specified postprocedural states: Secondary | ICD-10-CM | POA: Diagnosis not present

## 2018-10-10 DIAGNOSIS — Z95810 Presence of automatic (implantable) cardiac defibrillator: Secondary | ICD-10-CM

## 2018-10-10 DIAGNOSIS — I2510 Atherosclerotic heart disease of native coronary artery without angina pectoris: Secondary | ICD-10-CM

## 2018-10-10 DIAGNOSIS — E785 Hyperlipidemia, unspecified: Secondary | ICD-10-CM

## 2018-10-10 DIAGNOSIS — Z8679 Personal history of other diseases of the circulatory system: Secondary | ICD-10-CM

## 2018-10-10 DIAGNOSIS — I10 Essential (primary) hypertension: Secondary | ICD-10-CM

## 2018-10-10 DIAGNOSIS — I5042 Chronic combined systolic (congestive) and diastolic (congestive) heart failure: Secondary | ICD-10-CM | POA: Diagnosis not present

## 2018-10-10 NOTE — Patient Instructions (Addendum)
Medication Instructions:  Your Physician recommend you continue on your current medication as directed.    If you need a refill on your cardiac medications before your next appointment, please call your pharmacy.   Lab work: Your physician recommends that you return for lab work in 2-3 months ([place]) by primary care doctor  Testing/Procedures: None  Follow-Up: At [facility], you and your health needs are our priority.  As part of our continuing mission to provide you with exceptional heart care, we have created designated Provider Care Teams.  These Care Teams include your primary Cardiologist (physician) and Advanced Practice Providers (APPs -  Physician Assistants and Nurse Practitioners) who all work together to provide you with the care you need, when you need it.   Your physician recommends that you schedule a follow-up appointment in 3-4 month with Dr. Warren Lacy
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
## 2018-10-12 ENCOUNTER — Encounter: Payer: Self-pay | Admitting: Physician Assistant

## 2018-10-13 ENCOUNTER — Ambulatory Visit (INDEPENDENT_AMBULATORY_CARE_PROVIDER_SITE_OTHER): Payer: Medicare Other

## 2018-10-13 DIAGNOSIS — Z95810 Presence of automatic (implantable) cardiac defibrillator: Secondary | ICD-10-CM

## 2018-10-13 DIAGNOSIS — I5042 Chronic combined systolic (congestive) and diastolic (congestive) heart failure: Secondary | ICD-10-CM

## 2018-10-13 NOTE — Progress Notes (Signed)
EPIC Encounter for ICM Monitoring  Patient Name: [name] is a 84 y.o. male Date: 10/13/2018 Primary Care Physican: Noralee Space, MD Primary Care Physican: Noralee Space, MD Primary Cardiologist: Hochrein Electrophysiologist: Faustino Congress Weight:unknown      Spoke with daughter [name].  She reported patient is doing very well and denied any fluid symptoms at this time.     Thoracic impedance normal.   Prescribed:Furosemide20 mg 1 tabletdaily.Potassium 10 mEq 1 tabletdaily.   Labs: 10/08/2018 Creatinine 1.17, BUN 26, Potassium 4.5, Sodium 138 08/21/2018 Creatinine 1.19, BUN 17, Potassium 5.2, Sodium 139, eGFR 55-64 07/24/2018 Creatinine1.03, BUN17, Potassium3.5, Sodium133, eGFR>60 07/23/2018 Creatinine1.10, BUN20, Potassium4.1, Sodium134, eGFR59->60  07/22/2018 Creatinine1.09, BUN28, Potassium4.1, Sodium135, eGFR>60  07/21/2018 Creatinine1.40, BUN30, Potassium4.4, Sodium140, 5:42 AM  07/21/2018 Creatinine1.36, BUN28, Potassium4.4, Sodium138, [id number] 02/13/2018 Creatinine1.15, BUN24, Potassium5.1, Sodium138, [id number] 02/10/2018 Creatinine0.98, BUN23, Potassium4.0, [id number], EGFR>60 12/16/2017 Creatinine 0.90, BUN 17, Potassium 4.4, Sodium 138 11/13/2017 Creatinine 0.92, BUN 16, Potassium 4.3, Sodium 136, EGFR 76-88 10/30/2017 Creatinine 1.06, BUN 14, Potassium 4.6, Sodium 134, EGFR 64-74 A complete set of results can be found in Results Review.  Recommendations: No changes.   Encouraged to call for fluid symptoms.  Follow-up plan: ICM clinic phone appointment on 11/13/2018.     Copy of ICM check sent to Dr. Rayann Heman.   3 month ICM trend: 10/13/2018    1 Year ICM trend:       Jesse Billings, RN 10/13/2018 9:37 AM

## 2018-10-19 ENCOUNTER — Other Ambulatory Visit: Payer: Self-pay | Admitting: Pulmonary Disease

## 2018-10-26 ENCOUNTER — Other Ambulatory Visit: Payer: Self-pay | Admitting: Pulmonary Disease

## 2018-10-30 ENCOUNTER — Ambulatory Visit (INDEPENDENT_AMBULATORY_CARE_PROVIDER_SITE_OTHER): Payer: Medicare Other

## 2018-10-30 DIAGNOSIS — R55 Syncope and collapse: Secondary | ICD-10-CM

## 2018-10-30 DIAGNOSIS — I255 Ischemic cardiomyopathy: Secondary | ICD-10-CM | POA: Diagnosis not present

## 2018-10-31 NOTE — Progress Notes (Signed)
Remote ICD transmission.   

## 2018-11-01 LAB — CUP PACEART REMOTE DEVICE CHECK
Battery Remaining Longevity: 85 mo
Battery Voltage: 2.99 V
Brady Statistic AP VP Percent: 0.09 %
Brady Statistic AP VS Percent: 99.64 %
Brady Statistic AS VP Percent: 0 %
Brady Statistic AS VS Percent: 0.27 %
Brady Statistic RA Percent Paced: 99.73 %
Brady Statistic RV Percent Paced: 0.09 %
Date Time Interrogation Session: 20200109093823
HighPow Impedance: 43 Ohm
HighPow Impedance: 52 Ohm
Implantable Lead Implant Date: 20090623
Implantable Lead Location: 753860
Implantable Lead Model: 6947
Implantable Pulse Generator Implant Date: 20170616
Lead Channel Impedance Value: 285 Ohm
Lead Channel Impedance Value: 361 Ohm
Lead Channel Pacing Threshold Amplitude: 0.875 V
Lead Channel Pacing Threshold Pulse Width: 0.4 ms
Lead Channel Pacing Threshold Pulse Width: 0.4 ms
Lead Channel Sensing Intrinsic Amplitude: 1 mV
Lead Channel Sensing Intrinsic Amplitude: 15 mV
Lead Channel Sensing Intrinsic Amplitude: 15 mV
Lead Channel Setting Pacing Amplitude: 2 V
Lead Channel Setting Pacing Amplitude: 2.5 V
Lead Channel Setting Pacing Pulse Width: 0.4 ms
Lead Channel Setting Sensing Sensitivity: 0.6 mV
MDC IDC LEAD IMPLANT DT: 20090623
MDC IDC LEAD LOCATION: 753859
MDC IDC MSMT LEADCHNL RA IMPEDANCE VALUE: 361 Ohm
MDC IDC MSMT LEADCHNL RA SENSING INTR AMPL: 1 mV
MDC IDC MSMT LEADCHNL RV PACING THRESHOLD AMPLITUDE: 0.875 V

## 2018-11-04 ENCOUNTER — Other Ambulatory Visit: Payer: Self-pay | Admitting: *Deleted

## 2018-11-04 MED ORDER — FUROSEMIDE 20 MG PO TABS: 20.0000 mg | ORAL_TABLET | Freq: Every day | ORAL | 0 refills | Status: DC

## 2018-11-09 ENCOUNTER — Other Ambulatory Visit: Payer: Self-pay | Admitting: Cardiology

## 2018-11-10 NOTE — Telephone Encounter (Signed)
Rx has been sent to the pharmacy electronically. ° °

## 2018-11-12 ENCOUNTER — Ambulatory Visit (INDEPENDENT_AMBULATORY_CARE_PROVIDER_SITE_OTHER): Payer: Medicare Other | Admitting: Family Medicine

## 2018-11-12 ENCOUNTER — Encounter: Payer: Self-pay | Admitting: Family Medicine

## 2018-11-12 VITALS — BP 120/64 | HR 83 | Ht 71.0 in | Wt 179.9 lb

## 2018-11-12 DIAGNOSIS — E039 Hypothyroidism, unspecified: Secondary | ICD-10-CM

## 2018-11-12 DIAGNOSIS — E785 Hyperlipidemia, unspecified: Secondary | ICD-10-CM | POA: Diagnosis not present

## 2018-11-12 DIAGNOSIS — N183 Chronic kidney disease, stage 3 unspecified: Secondary | ICD-10-CM

## 2018-11-12 DIAGNOSIS — M1A09X0 Idiopathic chronic gout, multiple sites, without tophus (tophi): Secondary | ICD-10-CM | POA: Diagnosis not present

## 2018-11-12 DIAGNOSIS — E559 Vitamin D deficiency, unspecified: Secondary | ICD-10-CM | POA: Diagnosis not present

## 2018-11-12 DIAGNOSIS — B0229 Other postherpetic nervous system involvement: Secondary | ICD-10-CM | POA: Diagnosis not present

## 2018-11-12 DIAGNOSIS — D649 Anemia, unspecified: Secondary | ICD-10-CM | POA: Diagnosis not present

## 2018-11-12 DIAGNOSIS — D692 Other nonthrombocytopenic purpura: Secondary | ICD-10-CM

## 2018-11-12 DIAGNOSIS — I10 Essential (primary) hypertension: Secondary | ICD-10-CM

## 2018-11-12 DIAGNOSIS — I255 Ischemic cardiomyopathy: Secondary | ICD-10-CM | POA: Diagnosis not present

## 2018-11-12 LAB — LIPID PANEL
CHOLESTEROL: 199 mg/dL (ref 0–200)
HDL: 47.4 mg/dL (ref >39.00)
NonHDL: 151.49
Total CHOL/HDL Ratio: 4
Triglycerides: 221 mg/dL — ABNORMAL HIGH (ref 0.0–149.0)
VLDL: 44.2 mg/dL — AB (ref 0.0–40.0)

## 2018-11-12 LAB — TSH: TSH: 8.74 u[IU]/mL — ABNORMAL HIGH (ref 0.35–4.50)

## 2018-11-12 LAB — T3, FREE: T3 FREE: 2.6 pg/mL (ref 2.3–4.2)

## 2018-11-12 LAB — LDL CHOLESTEROL, DIRECT: Direct LDL: 119 mg/dL

## 2018-11-12 LAB — T4, FREE: Free T4: 0.99 ng/dL (ref 0.60–1.60)

## 2018-11-12 NOTE — Progress Notes (Signed)
[name] DOB: 02/03/1931 Encounter date: 11/12/2018  This is a 85 y.o. male who presents to establish care. Chief Complaint  Patient presents with  . New Patient (Initial Visit)    History of present illness: Has been following with Dr. Lenna Gilford for at least 24 years.   HTN: CAD/PVD/systolic and diastolic DE:[id number] with cardiology Dr. Percival Spanish and seen last 12/22 by Almyra Deforest PA. ICM s/p ICD 2009.  Aortic aneurysm (repair 2002) COPD w chronic hypoxemic resp failure: On oxygen at night. Gets short of breath easily. Does inhalers twice daily. Breathing has been stable for him.  Had VT/VF arrest while driving (June [id number]). Also hx of subdural hematoma after fall at home. Has had orthostatic hypotensive issues and falls. Discharged in 07/2018 from fall s/p syncope and rectal bleeding and HF exacerbation. Plavix stopped at that time, he has been continued on ASA. Hx osteoporosis (with T12 compression frx 1999) with improvement of 11.4% demonstrated on DXA 2019 and gout; follows with Dr. Estanislado Pandy and on Fosamax. On allopurinol for gout control. Low dose prednisone for COPD. (spinal frx from falling down steps; had back surgery following this). Takes the allopurinol daily; hasn't had attack in years. Comes in mid foot when he gets.  Hx of Basal cell carcinomal left ear: Follows with Dr. Nevada Crane for dermatology needs.   Uses walker at home to get around and cane when he is out.   Still has pain where ear was removed for cancer. Was dx with shingles a few years ago, but just has kept some residual pain. First attack very painful. Just stays uncomfortable. Brushing hair bothers him.   Has two daughters - one in [place] and one here. One here is Chief Executive Officer.   Does do exercises to maintain mobility. Tries to get these done daily, but sometimes misses days. Does things taught to him by therapist. Standing and ankle exercises, leg exercises/lifts and range of motion, arm exercises with weights.   Past  Medical History:  Diagnosis Date  . AAA (abdominal aortic aneurysm) ([place])    a. 2002 s/p repair.  . Allergic rhinitis   . Back pain   . CAD (coronary artery disease)    a. 02/2002 H/o MI with stenting x 2; b. 04/2013 MV: EF 25%, large posterior lateral infarct w/o ischemia; c. 03/2016 VT Arrest/Cath: LM 60ost, LAD 40p/m ISR, LCX 100p/m, RCA nl-->Med Rx.  . Carotid arterial disease ([place])    a. 12/2010 s/p R CEA;  b. 05/2015 Carotid U/S: bilat <40% ICA stenosis.  . Chronic combined systolic and diastolic CHF (congestive heart failure) ([place])    a. 03/2016 Echo: Ef 35-40%, grade 1 DD.  Marland Kitchen Compression fracture   . COPD (chronic obstructive pulmonary disease) ([place])   . Dermatitis   . Diverticulosis of colon   . DJD (degenerative joint disease)    and Gout  . Hemorrhoids   . History of colonic polyps   . History of gout   . History of pneumonia   . Hypercholesterolemia   . Hypertension   . Hypertensive heart disease   . Ischemic cardiomyopathy    a. EF prev <35%-->improved to normal by Echo 8/14:  Mild LVH, focal basal hypertrophy, EF 60-65%, normal wall motion, mild BAE, PASP 36; c. 03/2016 Echo: EF 35-40%, Gr1 DD, triv AI, mild MR, mod dil LA, mild-mod TR, PASP 47mmHg.  . Osteoporosis   . Peripheral vascular disease ([place])   . Presence of cardiac defibrillator    a. 03/2008 s/p MDT  [id number] Maximo II DR, DC AICD; b. 03/2013: ICD shock for T wave oversensing;  c. 10/2015: collective decision not to replace ICD given improvement in LV fxn; d. VT Arrest-->Gen change to MDT ser # [id number].  . Skin cancer    shoulders and forehead  . Syncope   . T wave over sensing resulting in inappropriate shocks    a. 03/2013.  . Tobacco abuse   . Ventricular tachycardia ([place]) 04/01/2016   Past Surgical History:  Procedure Laterality Date  . ABDOMINAL AORTIC ANEURYSM REPAIR  2002   by Dr. Kellie Simmering  . aicd placed  03/2008   Dr. Caryl Comes  . cad stent  02/2002   Dr. Percival Spanish  . CARDIAC CATHETERIZATION     X 2  stents  . CARDIAC CATHETERIZATION N/A 04/03/2016   Procedure: Left Heart Cath and Coronary Angiography;  Surgeon: Belva Crome, MD;  Location: [facility];  Service: Cardiovascular;  Laterality: N/A;  . CARDIAC DEFIBRILLATOR PLACEMENT  2009  . CARDIAC DEFIBRILLATOR PLACEMENT    . CAROTID ENDARTERECTOMY Right January 02, 2011  . EAR CYST EXCISION Left 12/13/2015   Procedure: Excision left ear lesion ;  Surgeon: Melissa Montane, MD;  Location: [facility];  Service: ENT;  Laterality: Left;  . EP IMPLANTABLE DEVICE N/A 04/06/2016   Procedure:  ICD Generator Changeout;  Surgeon: Deboraha Sprang, MD;  Location: [facility];  Service: Cardiovascular;  Laterality: N/A;  . EXCISION OF LESION LEFT EAR Left 12/13/2015  . EXTERNAL EAR SURGERY Left    Cancer Removal from left ear  . I&D EXTREMITY Left 04/23/2016   Procedure: IRRIGATION AND DEBRIDEMENT HAND;  Surgeon: Leandrew Koyanagi, MD;  Location: [facility];  Service: Orthopedics;  Laterality: Left;  . IR IMAGE GUIDED DRAINAGE BY PERCUTANEOUS CATHETER  10/08/2017  . lumbar back     done after compression fracture  . OPEN REDUCTION INTERNAL FIXATION (ORIF) METACARPAL Left 04/04/2016   Procedure: OPEN REDUCTION INTERNAL FIXATION (ORIF) LEFT 2ND, 3RD, 4TH METACARPAL FRACTURE;  Surgeon: Leandrew Koyanagi, MD;  Location: [facility];  Service: Orthopedics;  Laterality: Left;  OPEN REDUCTION INTERNAL FIXATION (ORIF) LEFT 2ND, 3RD, 4TH METACARPAL FRACTURE  . OPEN REDUCTION INTERNAL FIXATION (ORIF) METACARPAL Left 04/23/2016   Procedure: REVISION OPEN REDUCTION INTERNAL FIXATION (ORIF) 2ND METACARPAL;  Surgeon: Leandrew Koyanagi, MD;  Location: [facility];  Service: Orthopedics;  Laterality: Left;  . right corotid enderectomy  12/2010   Dr. Scot Dock  . SKIN SPLIT GRAFT Left 12/13/2015   Procedure: with possible skin graft;  Surgeon: Melissa Montane, MD;  Location: [facility];  Service: ENT;  Laterality: Left;  . TONSILLECTOMY     Allergies  Allergen Reactions  . Lisinopril     BP dropped too low per  daughter   Current Meds  Medication Sig  . ADVAIR HFA 115-21 MCG/ACT inhaler INHALE 2 PUFFS TWICE DAILY  . albuterol (PROVENTIL HFA;VENTOLIN HFA) 108 (90 Base) MCG/ACT inhaler Inhale 2 puffs into the lungs every 6 (six) hours as needed for wheezing or shortness of breath.  Marland Kitchen alendronate (FOSAMAX) 70 MG tablet TAKE 1 TAB ONCE A WEEK, AT LEAST 30 MIN BEFORE 1ST FOOD.DO NOT LIE DOWN FOR 30 MIN AFTER TAKING.  Marland Kitchen allopurinol (ZYLOPRIM) 100 MG tablet Take 1 tablet (100 mg total) by mouth daily.  Marland Kitchen amiodarone (PACERONE) 200 MG tablet TAKE 1 TABLET ONCE DAILY.  Marland Kitchen aspirin EC 81 MG tablet Take 81 mg by mouth daily.  . carvedilol (COREG) 3.125 MG  tablet TAKE 1 TABLET BY MOUTH TWICE DAILY WITH A MEAL.  . cholecalciferol (VITAMIN D) 1000 units tablet Take 1,000 Units by mouth daily.  . fluticasone (CUTIVATE) 0.05 % cream Apply 1 application topically 2 (two) times daily.   . furosemide (LASIX) 20 MG tablet Take 1 tablet (20 mg total) by mouth daily.  Marland Kitchen guaiFENesin (MUCINEX) 600 MG 12 hr tablet Take 600 mg by mouth 2 (two) times daily.   . magnesium oxide (MAG-OX) 400 (241.3 Mg) MG tablet Take 0.5 tablets (200 mg total) by mouth daily.  . midodrine (PROAMATINE) 2.5 MG tablet TAKE 1 TABLET 3 TIMES DAILY WITH MEALS.  . Multiple Vitamins-Minerals (PRESERVISION AREDS 2 PO) Take 1 capsule by mouth daily.  Marland Kitchen oxybutynin (DITROPAN) 5 MG tablet TAKE 1 TABLET BY MOUTH TWICE DAILY.  Marland Kitchen polyethylene glycol powder (QC NATURA-LAX) powder 1 capful in 8oz water twice daily (Patient taking differently: 1 capful in [place] water as needed)  . potassium chloride (K-DUR) 10 MEQ tablet Take 1 tablet (10 mEq total) by mouth daily.  . pravastatin (PRAVACHOL) 40 MG tablet TAKE 1 TABLET ONCE DAILY.  Marland Kitchen predniSONE (DELTASONE) 5 MG tablet Take 5 mg by mouth daily with breakfast. Take 1/2 (10mg ) tab  . sertraline (ZOLOFT) 50 MG tablet TAKE ONE TABLET AT BEDTIME.  Marland Kitchen thiamine 100 MG tablet Take 1 tablet (100 mg total) by mouth daily.    Social History   Tobacco Use  . Smoking status: Former Smoker    Packs/day: 1.50    Years: 65.00    Pack years: 97.50    Types: Cigarettes, Cigars, Pipe    Last attempt to quit: 03/13/2008    Years since quitting: 10.6  . Smokeless tobacco: Never Used  . Tobacco comment: quit about 10 years ago  Substance Use Topics  . Alcohol use: Yes    Alcohol/week: 9.0 standard drinks    Types: 2 Glasses of wine, 7 Cans of beer per week    Comment: h/o heavy use, but "quit" drinking and now only drinks 1-3 daily   Family History  Problem Relation Age of Onset  . Hypertension Father   . Heart disease Father 68       Heart Disease before age 76  . Alcohol abuse Father        causing multiple medical problems; uncertain exact cause of death  . Hypertension Mother   . Heart disease Mother        Heart Disease before age 34  . Cancer Mother        pancreatic  . Scoliosis Sister   . Thyroid disease Daughter   . Anxiety disorder Son      Review of Systems  Constitutional: Negative for chills, fatigue and fever.  Respiratory: Positive for shortness of breath (stable, but present with exertion). Negative for cough, chest tightness and wheezing.   Cardiovascular: Negative for chest pain, palpitations and leg swelling.  Neurological:       Shooting pain left side of head; behind ear (where previous shingles outbreak was). Comes and goes. No blistering, just pain. Irritated easily by touch.    Objective:  BP 120/64 (BP Location: Left Arm, Patient Position: Sitting, Cuff Size: Normal)   Pulse 83   Ht 5\' 11"  (1.803 m)   Wt 179 lb 14.4 oz (81.6 kg)   SpO2 92%   BMI 25.09 kg/m   Weight: 179 lb 14.4 oz (81.6 kg)   BP Readings from Last 3 Encounters:  11/12/18 120/64  10/10/18 110/70  10/08/18 124/78   Wt Readings from Last 3 Encounters:  11/12/18 179 lb 14.4 oz (81.6 kg)  10/10/18 173 lb 3.2 oz (78.6 kg)  10/08/18 167 lb 12.8 oz (76.1 kg)    Physical Exam Constitutional:       General: He is not in acute distress.    Appearance: He is well-developed.  Cardiovascular:     Rate and Rhythm: Normal rate and regular rhythm.     Heart sounds: Murmur present. Systolic murmur present with a grade of 2/6. No friction rub.  Pulmonary:     Effort: Pulmonary effort is normal. No respiratory distress.     Breath sounds: Normal breath sounds. No wheezing or rales.  Musculoskeletal:     Right lower leg: No edema.     Left lower leg: No edema.  Skin:    Comments: Ecchymosis backs of hands, extensor surfaces of forearms.   There is flaking and crusting around left ear - appears to be a component of seborrheic dermatitis since located in hairline. No erythema, no skin lesions.   Neurological:     Mental Status: He is alert and oriented to person, place, and time.  Psychiatric:        Attention and Perception: Attention normal.        Speech: Speech normal.        Behavior: Behavior normal.     Assessment/Plan: 1. Senile purpura (HCC) Incidental on exam. Takes 81mg  aspirin which likely contributes.   2. Essential hypertension Stable; managed by cardiology.   3. Cardiomyopathy, ischemic Follows with cardiology. Symptomatically stable.  4. Vitamin D deficiency disease On vitamin D.  5. Idiopathic chronic gout of multiple sites without tophus No flares in recent few years on the allopurinol.  6. CKD (chronic kidney disease) stage 3, GFR 30-59 ml/min (HCC) Stable. Will continue to monitor renal function.  7. Hypothyroidism, unspecified type Recheck TSH (of note synthroid fell off list so was not noted when bloodwork was ordered but he has been on 37mcg synthroid) - TSH; Future - T3, free - T4, free; Future - T4, free - TSH  8. Anemia, unspecified type Will recheck for stability.  9. Hyperlipidemia, unspecified hyperlipidemia type - Lipid panel; Future - Lipid panel  10. Post herpetic neuralgia He has lidocaine gel at home; I have encouraged him to try  this on skin where he is symptomatic. Let me know if this is not working for him.    Return pending bloodwork.  Micheline Rough, MD

## 2018-11-13 ENCOUNTER — Encounter: Payer: Self-pay | Admitting: Pulmonary Disease

## 2018-11-13 ENCOUNTER — Ambulatory Visit (INDEPENDENT_AMBULATORY_CARE_PROVIDER_SITE_OTHER): Payer: Medicare Other

## 2018-11-13 DIAGNOSIS — Z95810 Presence of automatic (implantable) cardiac defibrillator: Secondary | ICD-10-CM

## 2018-11-13 DIAGNOSIS — I5042 Chronic combined systolic (congestive) and diastolic (congestive) heart failure: Secondary | ICD-10-CM
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
## 2018-11-13 NOTE — Progress Notes (Signed)
EPIC Encounter for ICM Monitoring  Patient Name: [name] is a 85 y.o. male Date: 11/13/2018 Primary Care Physican: Koberlein, Junell C, MD Primary Cardiologist: Hochrein Electrophysiologist: Klein Dry Weight:unknown                                          Attempted call to daughter [name].  Left detailed message per DPR.  Transmission reviewed.    Thoracic impedance normal.   Prescribed:Furosemide20 mg 1 tabletdaily.Potassium 10 mEq 1 tabletdaily.   Labs: 10/08/2018 Creatinine 1.17, BUN 26, Potassium 4.5, Sodium 138 08/21/2018 Creatinine 1.19, BUN 17, Potassium 5.2, Sodium 139, eGFR 55-64 07/24/2018 Creatinine1.03, BUN17, Potassium3.5, Sodium133, eGFR>60 07/23/2018 Creatinine1.10, BUN20, Potassium4.1, Sodium134, eGFR59->60  07/22/2018 Creatinine1.09, BUN28, Potassium4.1, Sodium135, eGFR>60  07/21/2018 Creatinine1.40, BUN30, Potassium4.4, Sodium140, 5:42 AM  07/21/2018 Creatinine1.36, BUN28, Potassium4.4, Sodium138, eGFR46-53 02/13/2018 Creatinine1.15, BUN24, Potassium5.1, Sodium138, EGFR58-67 02/10/2018 Creatinine0.98, BUN23, Potassium4.0, Sodium134, EGFR>60 12/16/2017 Creatinine 0.90, BUN 17, Potassium 4.4, Sodium 138 11/13/2017 Creatinine 0.92, BUN 16, Potassium 4.3, Sodium 136, EGFR 76-88 10/30/2017 Creatinine 1.06, BUN 14, Potassium 4.6, Sodium 134, EGFR 64-74 A complete set of results can be found in Results Review.  Recommendations: Left voice mail with ICM number and encouraged to call if experiencing any fluid symptoms.  Follow-up plan: ICM clinic phone appointment on 12/16/2018.     Copy of ICM check sent to Dr. Allred.   3 month ICM trend: 11/13/2018    1 Year ICM trend:       Jesse S Short, RN 11/13/2018 4:54 PM   

## 2018-11-14 ENCOUNTER — Other Ambulatory Visit: Payer: Self-pay | Admitting: Family Medicine

## 2018-11-14 MED ORDER — LEVOTHYROXINE SODIUM 75 MCG PO TABS: 50.0000 ug | ORAL_TABLET | Freq: Every day | ORAL | 1 refills | Status: DC

## 2018-11-16 ENCOUNTER — Encounter: Payer: Self-pay | Admitting: Family Medicine

## 2018-11-16 ENCOUNTER — Other Ambulatory Visit: Payer: Self-pay | Admitting: Pulmonary Disease

## 2018-11-17 NOTE — Telephone Encounter (Signed)
Notes recorded by Caren Macadam, MD on 11/13/2018 at 11:13 AM EST Increase synthroid to 74mcg daily and plan to recheck TSH in 8 weeks   Please advise on BNP levels

## 2018-11-22 ENCOUNTER — Other Ambulatory Visit: Payer: Self-pay | Admitting: Pulmonary Disease

## 2018-11-23 ENCOUNTER — Other Ambulatory Visit: Payer: Self-pay | Admitting: Pulmonary Disease

## 2018-11-26 ENCOUNTER — Encounter: Payer: Self-pay | Admitting: Family Medicine

## 2018-11-28 ENCOUNTER — Encounter

## 2018-12-01 ENCOUNTER — Encounter: Payer: Self-pay | Admitting: Family Medicine

## 2018-12-01 ENCOUNTER — Ambulatory Visit (INDEPENDENT_AMBULATORY_CARE_PROVIDER_SITE_OTHER): Payer: Medicare Other | Admitting: Family Medicine

## 2018-12-01 ENCOUNTER — Other Ambulatory Visit: Payer: Self-pay

## 2018-12-01 VITALS — BP 110/60 | HR 82 | Temp 98.0°F | Wt 182.2 lb

## 2018-12-01 DIAGNOSIS — J209 Acute bronchitis, unspecified: Secondary | ICD-10-CM | POA: Diagnosis not present

## 2018-12-01 DIAGNOSIS — I255 Ischemic cardiomyopathy: Secondary | ICD-10-CM | POA: Diagnosis not present

## 2018-12-01 MED ORDER — AZITHROMYCIN 250 MG PO TABS: ORAL_TABLET | ORAL | 0 refills | Status: DC

## 2018-12-01 MED ORDER — POTASSIUM CHLORIDE ER 10 MEQ PO TBCR: 10.0000 meq | EXTENDED_RELEASE_TABLET | Freq: Every day | ORAL | 2 refills | Status: DC

## 2018-12-01 NOTE — Progress Notes (Signed)
   Subjective:    Patient ID: [name], male    DOB: 1931-08-13, 85 y.o.   MRN: [id number]  HPI Here for 4 days of chest congestion and coughing up yellow sputum. He had some fever at first but not now.    Review of Systems  Constitutional: Negative.   HENT: Positive for congestion and postnasal drip. Negative for sinus pressure, sinus pain and sore throat.   Eyes: Negative.   Respiratory: Positive for cough and chest tightness. Negative for shortness of breath and wheezing.   Cardiovascular: Negative.        Objective:   Physical Exam Constitutional:      Appearance: Normal appearance.  HENT:     Right Ear: Tympanic membrane and ear canal normal.     Left Ear: Tympanic membrane and ear canal normal.     Nose: Nose normal.     Mouth/Throat:     Pharynx: Oropharynx is clear.  Eyes:     Conjunctiva/sclera: Conjunctivae normal.  Pulmonary:     Effort: Pulmonary effort is normal. No respiratory distress.     Breath sounds: Rhonchi present. No wheezing or rales.  Lymphadenopathy:     Cervical: No cervical adenopathy.  Neurological:     Mental Status: He is alert.           Assessment & Plan:  Bronchitis, treat with a Zpack and Delsym. Alysia Penna, MD

## 2018-12-07 ENCOUNTER — Other Ambulatory Visit: Payer: Self-pay | Admitting: Rheumatology

## 2018-12-08 NOTE — Telephone Encounter (Signed)
Last Visit: 10/07/18 Next visit: 01/29/19 Labs: 10/08/18 BUN 26 AST 41 Hgb 12.7 Hct 38.6 Platelets 138   Okay to refill Fosamax?

## 2018-12-08 NOTE — Telephone Encounter (Signed)
ok 

## 2018-12-10 ENCOUNTER — Telehealth: Payer: Self-pay | Admitting: Pulmonary Disease

## 2018-12-10 ENCOUNTER — Ambulatory Visit (INDEPENDENT_AMBULATORY_CARE_PROVIDER_SITE_OTHER): Payer: Medicare Other | Admitting: Pulmonary Disease

## 2018-12-10 ENCOUNTER — Encounter: Payer: Self-pay | Admitting: Pulmonary Disease

## 2018-12-10 VITALS — BP 128/82 | HR 85 | Ht 71.0 in | Wt 183.4 lb

## 2018-12-10 DIAGNOSIS — J449 Chronic obstructive pulmonary disease, unspecified: Secondary | ICD-10-CM | POA: Diagnosis not present

## 2018-12-10 DIAGNOSIS — R58 Hemorrhage, not elsewhere classified: Secondary | ICD-10-CM

## 2018-12-10 DIAGNOSIS — I5042 Chronic combined systolic (congestive) and diastolic (congestive) heart failure: Secondary | ICD-10-CM | POA: Diagnosis not present

## 2018-12-10 DIAGNOSIS — J9611 Chronic respiratory failure with hypoxia: Secondary | ICD-10-CM

## 2018-12-10 DIAGNOSIS — Z7952 Long term (current) use of systemic steroids: Secondary | ICD-10-CM | POA: Diagnosis not present

## 2018-12-10 DIAGNOSIS — T380X5A Adverse effect of glucocorticoids and synthetic analogues, initial encounter: Secondary | ICD-10-CM

## 2018-12-10 MED ORDER — PREDNISONE 1 MG PO TABS: 3.0000 mg | ORAL_TABLET | Freq: Every day | ORAL | 0 refills | Status: AC

## 2018-12-10 MED ORDER — BUDESONIDE 0.5 MG/2ML IN SUSP: 0.5000 mg | Freq: Two times a day (BID) | RESPIRATORY_TRACT | 5 refills | Status: DC

## 2018-12-10 MED ORDER — FORMOTEROL FUMARATE 20 MCG/2ML IN NEBU: 20.0000 ug | INHALATION_SOLUTION | Freq: Two times a day (BID) | RESPIRATORY_TRACT | 0 refills | Status: DC

## 2018-12-10 MED ORDER — FORMOTEROL FUMARATE 20 MCG/2ML IN NEBU: 20.0000 ug | INHALATION_SOLUTION | Freq: Two times a day (BID) | RESPIRATORY_TRACT | 5 refills | Status: AC

## 2018-12-10 MED ORDER — REVEFENACIN 175 MCG/3ML IN SOLN: 3.0000 mL | Freq: Every day | RESPIRATORY_TRACT | 5 refills | Status: DC

## 2018-12-10 MED ORDER — REVEFENACIN 175 MCG/3ML IN SOLN: 3.0000 mL | Freq: Every day | RESPIRATORY_TRACT | 0 refills | Status: DC

## 2018-12-10 NOTE — Telephone Encounter (Signed)
I have called and spoke to the pharmacist and verified all medication sent in today.Nothing further needed at this time.  

## 2018-12-10 NOTE — Patient Instructions (Addendum)
Thank you for visiting Dr. Valeta Harms at [facility]. Today we recommend the following: Orders Placed This Encounter  Procedures  . Ambulatory Referral for DME   Meds ordered this encounter  Medications  . predniSONE (DELTASONE) 1 MG tablet    Sig: Take 3 tablets (3 mg total) by mouth daily with breakfast.    Dispense:  270 tablet    Refill:  0  . formoterol (PERFOROMIST) 20 MCG/2ML nebulizer solution    Sig: Take 2 mLs (20 mcg total) by nebulization 2 (two) times daily.    Dispense:  20 mL    Refill:  0  . Revefenacin (YUPELRI) 175 MCG/3ML SOLN    Sig: Inhale 3 mLs into the lungs daily.    Dispense:  42 mL    Refill:  0  . budesonide (PULMICORT) 0.5 MG/2ML nebulizer solution    Sig: Take 2 mLs (0.5 mg total) by nebulization 2 (two) times daily.    Dispense:  120 mL    Refill:  5    Please run through Medicare Part D, DX J44.9, J96.21   Please stop your Advair inhaler. We will be switching to all medications via your new nebulizer machine. These medications will be listed above. We will also obtain full pulmonary function test upon your return. We will decrease your prednisone dosing to 3 mg daily which will come and 3 tablets that you will take all at once once a day.  Return in about 6 weeks (around 01/21/2019), or if symptoms worsen or fail to improve.
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
## 2018-12-15 ENCOUNTER — Telehealth: Payer: Self-pay | Admitting: Pulmonary Disease

## 2018-12-15 NOTE — Telephone Encounter (Signed)
Will send [name] a message via [place]

## 2018-12-16 ENCOUNTER — Ambulatory Visit (INDEPENDENT_AMBULATORY_CARE_PROVIDER_SITE_OTHER): Payer: Medicare Other

## 2018-12-16 DIAGNOSIS — Z95810 Presence of automatic (implantable) cardiac defibrillator: Secondary | ICD-10-CM

## 2018-12-16 DIAGNOSIS — I5042 Chronic combined systolic (congestive) and diastolic (congestive) heart failure: Secondary | ICD-10-CM

## 2018-12-17 NOTE — Progress Notes (Signed)
EPIC Encounter for ICM Monitoring  Patient Name: [name] is a 85 y.o. male Date: 12/17/2018 Primary Care Physican: Caren Macadam, MD Primary Cardiologist: Hochrein Electrophysiologist: Faustino Congress Weight:unknown  Call to daughter [name]. Heart failure questions reviewed and patient asymptomatic.  Thoracic impedance normal.   Prescribed:Furosemide20 mg 1 tabletdaily.Potassium 10 mEq 1 tabletdaily.   Labs: 10/08/2018 Creatinine 1.17, BUN 26, Potassium 4.5, Sodium 138 08/21/2018 Creatinine 1.19, BUN 17, Potassium 5.2, Sodium 139, GFR 55-64 07/24/2018 Creatinine1.03, BUN17, Potassium3.5, Sodium133, GFR>60 07/23/2018 Creatinine1.10, BUN20, Potassium4.1, Sodium134, GFR59->60  07/22/2018 Creatinine1.09, BUN28, Potassium4.1, Sodium135, GFR>60  07/21/2018 Creatinine1.40, BUN30, Potassium4.4, Sodium140, 5:42 AM  07/21/2018 Creatinine1.36, BUN28, Potassium4.4, [id number], GFR46-53 02/13/2018 Creatinine1.15, BUN24, Potassium5.1, Sodium138, [id number] 02/10/2018 Creatinine0.98, BUN23, Potassium4.0, [id number], GFR>60 12/16/2017 Creatinine 0.90, BUN 17, Potassium 4.4, Sodium 138 11/13/2017 Creatinine 0.92, BUN 16, Potassium 4.3, Sodium 136, EGFR 76-88 10/30/2017 Creatinine 1.06, BUN 14, Potassium 4.6, Sodium 134, EGFR 64-74 A complete set of results can be found in Results Review.  Recommendations:No changes and encouraged to call if experiencing any fluid symptoms.  Follow-up plan: ICM clinic phone appointment on3/30/2020.  Copy of ICM check sent to Dr.Allred.  3 month ICM trend: 12/16/2018    1 Year ICM trend:       Jesse Billings, RN 12/17/2018 8:48 AM

## 2018-12-19 NOTE — Telephone Encounter (Signed)
Spoke with patient's daughter [name]. She stated that the patient is still waiting on his medications. She called [facility] was advised that they are waiting on Dr. Valeta Harms to sign the SMN. Dr. Valeta Harms is not scheduled to come back into the office until next Wednesday. Patient has already been several weeks without medication.   Rodena Piety, is there a way that we can get a NP to sign the forms on Dr. Juline Patch behalf? Please advise. Thanks!

## 2018-12-23 ENCOUNTER — Telehealth: Payer: Self-pay | Admitting: Pulmonary Disease

## 2018-12-23 NOTE — Telephone Encounter (Signed)
Spoke with [facility], they stated they faxed over a Detailed written order for the [place]  to be signed and faxed back. Rodena Piety did you receive this? They faxed it to the [phone]. Please advise.   Casimer Leek  to Valerie Salts, CMA       5:16 PM  Hi -- Did you get the message about the medical necessity letter that the pharmacy with [place] said they need before filling the prescriptions? I hadn't seen a reply so I called the office Friday and was told Dr. Valeta Harms was at the hospital last week. They were going to check to see if the n use could send the letter and let me know. I haven't heard back so am following up.  Thanks,  Marshell Levan for [employer]

## 2018-12-23 NOTE — Telephone Encounter (Signed)
I don't have anything for this patient. Are they faxing the form to the correct fax #? Was it for Dr. Lenna Gilford or Dr. Valeta Harms to sign. I don't have anything for Dr. Lenna Gilford either

## 2018-12-24 ENCOUNTER — Telehealth: Payer: Self-pay | Admitting: Pulmonary Disease

## 2018-12-24 NOTE — Telephone Encounter (Signed)
Called and spoke with Patient.  Patient stated that he received a call from [facility], stating they needed a letter from Dr Valeta Harms, so he could get his Prednisone prescription.  [place].  I was told that nothing was needed for prednisone refill.  Cyril Mourning stated that prednisone #90 was ordered 12/10/18, and it went through insurance.  She stated nothing was needed.  Called Patient to let him know that nothing further was needed for his prednisone prescription.  Patient stated that he was going to call and pick up prescription today. Nothing further at this time.

## 2018-12-24 NOTE — Telephone Encounter (Signed)
Fax received from [facility].  [place] aware.  Will route message to [place] to follow up.

## 2018-12-24 NOTE — Telephone Encounter (Signed)
It was faxed to the correct number- [place] have you seen this?  Please advise and if not will call and ask them to fax again, thanks

## 2018-12-24 NOTE — Telephone Encounter (Signed)
Amy, [facility], is calling for an update on SMN for [place]. Cb is [phone].

## 2018-12-24 NOTE — Telephone Encounter (Signed)
Await fax.

## 2018-12-24 NOTE — Telephone Encounter (Signed)
Call made to [facility], they are going to try faxing form again. If form not received today, will contact tomorrow.

## 2018-12-25 ENCOUNTER — Telehealth: Payer: Self-pay | Admitting: Pulmonary Disease

## 2018-12-25 NOTE — Telephone Encounter (Signed)
Received call from [place], Patients daughter.  She stated that meds were changed from inhalers to nebs, at last OV.  [name] stated that Patient has not received nebs from mail order pharmacy.  [name] stated that she called [facility], and was told that they had removed patient from list, because they have not received medical necessity from Dr Valeta Harms.  Fax was received yesterday from [place] 12/24/18, and given to [place].  [name] stated that she has tried to reach out by my chart, but can not, because Dr Juline Patch name does not show up.  [name] stated that she called IT, and was told patient had to be added to Dr Valeta Harms, to send him messages. [name] is very upset, because the Patient is without medication needed. [name] would like a call back once nebs have been taking care of  at [phone].  Message routed to [place], [place] , to follow up

## 2018-12-25 NOTE — Telephone Encounter (Signed)
Call made to daughter, made aware the order has been  Faxed. Called [facility] to ensure receipt. Nothing further is needed at this time.

## 2018-12-26 NOTE — Telephone Encounter (Signed)
Order has been signed and faxed to [facility]. Called [facility] to confirm receipt. Nothing further is needed at this time.

## 2019-01-04 ENCOUNTER — Other Ambulatory Visit: Payer: Self-pay | Admitting: Pulmonary Disease

## 2019-01-05 ENCOUNTER — Other Ambulatory Visit: Payer: Self-pay | Admitting: Rheumatology

## 2019-01-05 ENCOUNTER — Other Ambulatory Visit: Payer: Self-pay | Admitting: Pulmonary Disease

## 2019-01-14 ENCOUNTER — Telehealth: Payer: Self-pay | Admitting: Physician Assistant

## 2019-01-14 NOTE — Telephone Encounter (Signed)
[name], the Patient's daughter called and said she lives in [place], and will not be able to help her Dad set up the WebEx. She is concerned about how vitals will be collected, and how the visit will be done in general.

## 2019-01-14 NOTE — Telephone Encounter (Signed)
I spoke with patient this morning about his upcoming appointment with Dr. Percival Spanish tomorrow at 140.  He has a smart phone and a computer and he would be comfortable doing a WebEx video appointment.  The only tricky part is his daughter [name] gets his my chart information and she will have to be involved in helping him download the WebEx.  Please change his appointment to a WebEx and send him all the information he needs to download this through my chart.

## 2019-01-15 ENCOUNTER — Ambulatory Visit (INDEPENDENT_AMBULATORY_CARE_PROVIDER_SITE_OTHER): Payer: Medicare Other | Admitting: Cardiology

## 2019-01-15 ENCOUNTER — Encounter: Payer: Self-pay | Admitting: Cardiology

## 2019-01-15 VITALS — BP 105/62 | HR 63 | Ht 71.0 in | Wt 183.0 lb

## 2019-01-15 DIAGNOSIS — I10 Essential (primary) hypertension: Secondary | ICD-10-CM

## 2019-01-15 DIAGNOSIS — Z79899 Other long term (current) drug therapy: Secondary | ICD-10-CM | POA: Diagnosis not present

## 2019-01-15 DIAGNOSIS — I5042 Chronic combined systolic (congestive) and diastolic (congestive) heart failure: Secondary | ICD-10-CM

## 2019-01-15 DIAGNOSIS — I2510 Atherosclerotic heart disease of native coronary artery without angina pectoris: Secondary | ICD-10-CM

## 2019-01-15 DIAGNOSIS — I472 Ventricular tachycardia, unspecified: Secondary | ICD-10-CM

## 2019-01-15 DIAGNOSIS — E785 Hyperlipidemia, unspecified: Secondary | ICD-10-CM
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
## 2019-01-15 NOTE — Telephone Encounter (Signed)
Pt had video visit with Dr Hochrein today 

## 2019-01-18 ENCOUNTER — Encounter: Payer: Self-pay | Admitting: Cardiology

## 2019-01-19 ENCOUNTER — Other Ambulatory Visit: Payer: Self-pay

## 2019-01-19 ENCOUNTER — Ambulatory Visit (INDEPENDENT_AMBULATORY_CARE_PROVIDER_SITE_OTHER): Payer: Medicare Other

## 2019-01-19 DIAGNOSIS — Z95810 Presence of automatic (implantable) cardiac defibrillator: Secondary | ICD-10-CM | POA: Diagnosis not present

## 2019-01-19 DIAGNOSIS — I5042 Chronic combined systolic (congestive) and diastolic (congestive) heart failure: Secondary | ICD-10-CM

## 2019-01-20 NOTE — Progress Notes (Signed)
EPIC Encounter for ICM Monitoring  Patient Name: [name] is a 85 y.o. male Date: 01/20/2019 Primary Care Physican: Caren Macadam, MD Primary Cardiologist: Hochrein Electrophysiologist: Faustino Congress Weight:unknown  Attempted call todaughter [name].  Left detailed message per DPR regarding transmission. Transmission reviewed.   Thoracic impedance normal.   Prescribed: Furosemide20 mg 1 tabletdaily.Potassium 10 mEq 1 tabletdaily.   Labs: 10/08/2018 Creatinine 1.17, BUN 26, Potassium 4.5, Sodium 138 08/21/2018 Creatinine 1.19, BUN 17, Potassium 5.2, Sodium 139, GFR 55-64 07/24/2018 Creatinine1.03, BUN17, Potassium3.5, Sodium133, GFR>60 07/23/2018 Creatinine1.10, BUN20, Potassium4.1, Sodium134, GFR59->60  07/22/2018 Creatinine1.09, BUN28, Potassium4.1, Sodium135, GFR>60  07/21/2018 Creatinine1.40, BUN30, Potassium4.4, Sodium140, 5:42 AM  07/21/2018 Creatinine1.36, BUN28, Potassium4.4, [id number], GFR46-53 02/13/2018 Creatinine1.15, BUN24, Potassium5.1, Sodium138, IXV85-50 02/10/2018 Creatinine0.98, BUN23, Potassium4.0, [id number], GFR>60 12/16/2017 Creatinine 0.90, BUN 17, Potassium 4.4, Sodium 138 11/13/2017 Creatinine 0.92, BUN 16, Potassium 4.3, Sodium 136, EGFR 76-88 10/30/2017 Creatinine 1.06, BUN 14, Potassium 4.6, Sodium 134, EGFR 64-74 A complete set of results can be found in Results Review.  Recommendations: Left voice mail with ICM number and encouraged to call if experiencing any fluid symptoms..  Follow-up plan: ICM clinic phone appointment on5/01/2019.  Copy of ICM check sent to Dr.Allred.  3 month ICM trend: 01/19/2019    1 Year ICM trend:       [name], RN 01/20/2019 9:16 AM

## 2019-01-21 ENCOUNTER — Ambulatory Visit: Payer: Medicare Other | Admitting: Pulmonary Disease

## 2019-01-25 ENCOUNTER — Other Ambulatory Visit: Payer: Self-pay | Admitting: Pulmonary Disease

## 2019-01-26 ENCOUNTER — Telehealth: Payer: Self-pay | Admitting: Pulmonary Disease

## 2019-01-26 NOTE — Telephone Encounter (Signed)
[name] please advise     Copied from mychart message: Hi [name] -- [name] was told that the medication would be shipping today and could take up to 3 days for delivery.  He has enough doses for today and tomorrow, so what we're wondering is if it's not received tomorrow, should he alter anything if no doses are available until the shipment arrives, likely on Wednesday? When this was originally prescribed his normal pharmacy ([facility]) didn't stock it since it's not covered by Medicare if ordered through them. Does this warrant trying to find out if [facility] can get it by Wednesday, or will a day or so of skipped doses matter?  My sister has put a call in to your office, so if this has already been addressed, I apologize for the redundancy.  Thanks again.  [name]

## 2019-01-26 NOTE — Telephone Encounter (Signed)
See email--

## 2019-01-27 ENCOUNTER — Telehealth: Payer: Self-pay | Admitting: Pulmonary Disease

## 2019-01-27 IMAGING — CT CT ANGIO CHEST-ABD-PELV FOR DISSECTION W/ AND WO/W CM
2 of 7 series · 14 of 46 positions shown, 16 images · IV contrast (APPLIED)
Comparison: None.

CLINICAL DATA: GI bleed.  Fall.

EXAM:
CT ANGIOGRAPHY CHEST, ABDOMEN AND PELVIS
TECHNIQUE: Multidetector CT imaging through the chest, abdomen and pelvis was
performed using the standard protocol during bolus administration of
intravenous contrast. Multiplanar reconstructed images and MIPs were
obtained and reviewed to evaluate the vascular anatomy.
CONTRAST:  80mL GY8PM9-TXD IOPAMIDOL (GY8PM9-TXD) INJECTION 76%

[Series 8: arterial · axial · arterial · 0.80mm/px · z∈[-916,-312]mm · 11 of 340 slices shown, 13 images]
[im 19/340  soft-tissue]
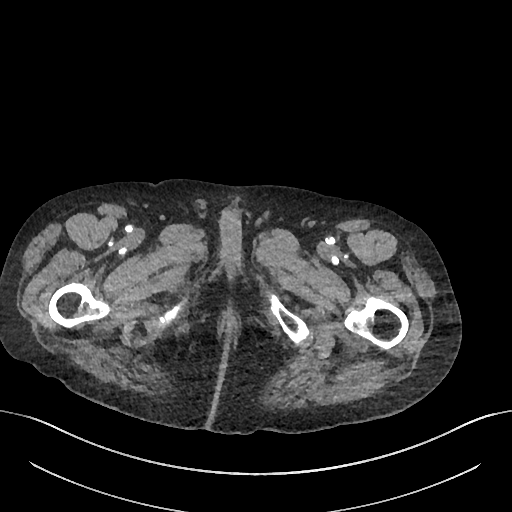
[im 19/340  bone]
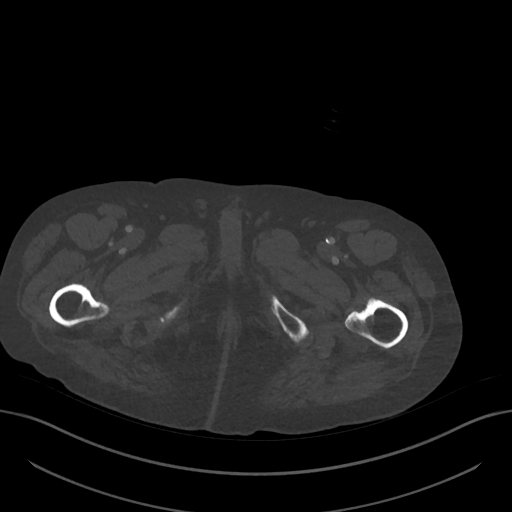
[im 57/340  soft-tissue]
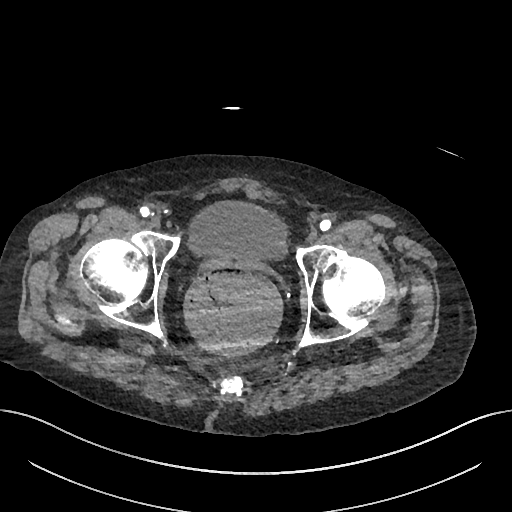
[im 76/340  soft-tissue]
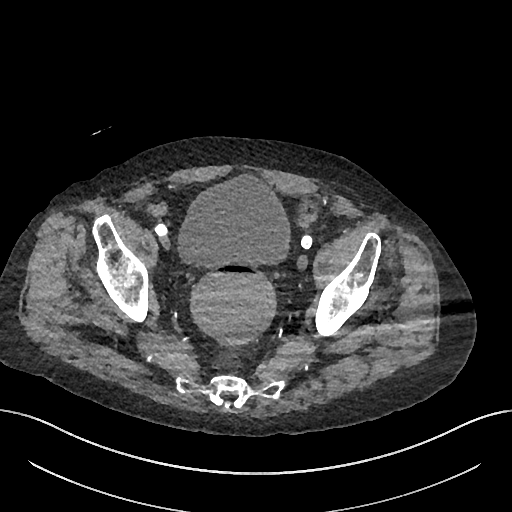
[im 114/340  soft-tissue]
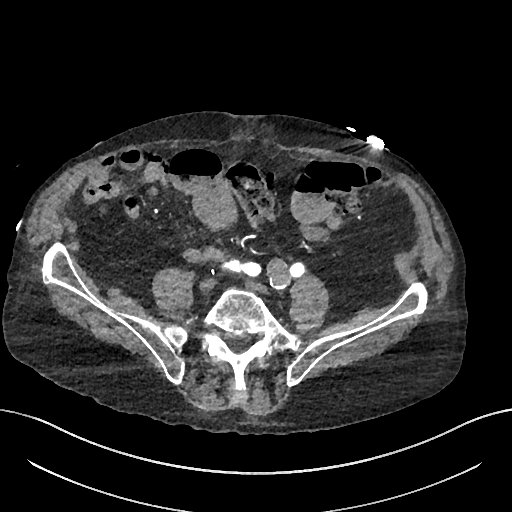
[im 132/340  soft-tissue]
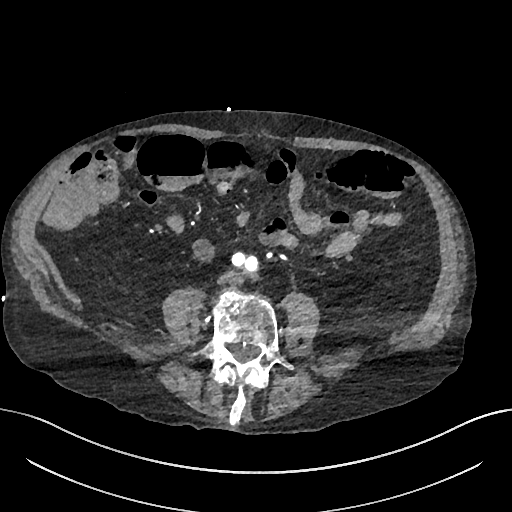
[im 170/340  soft-tissue]
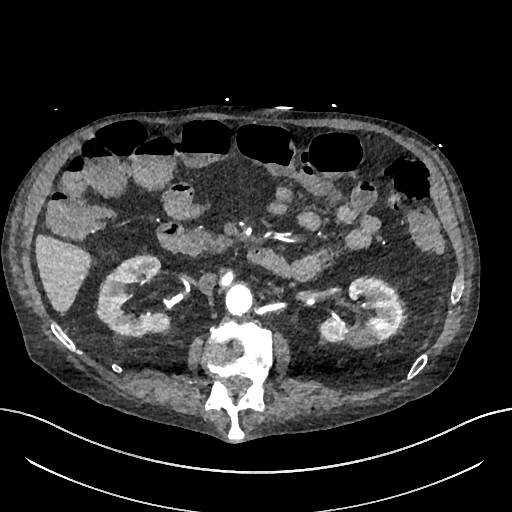
[im 208/340  soft-tissue]
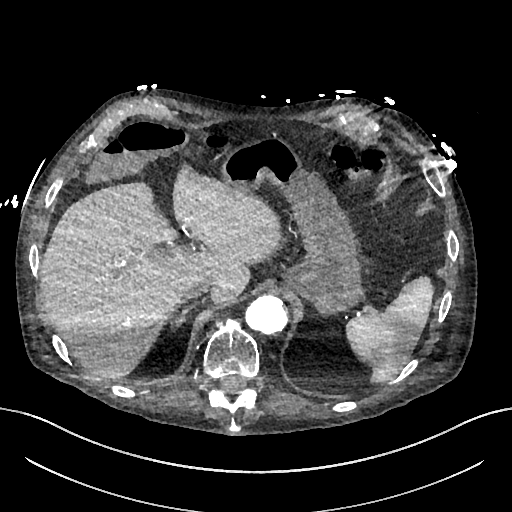
[im 227/340  soft-tissue]
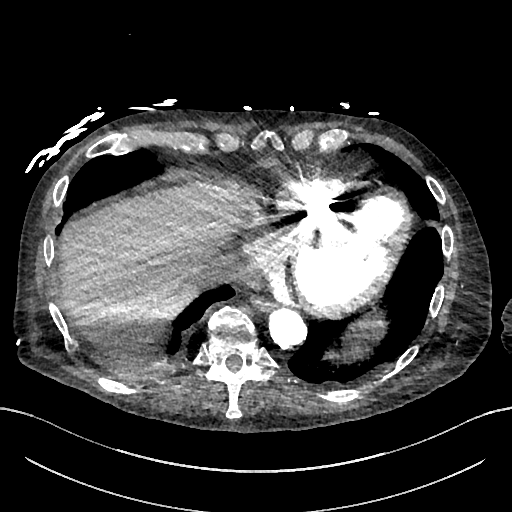
[im 264/340  soft-tissue]
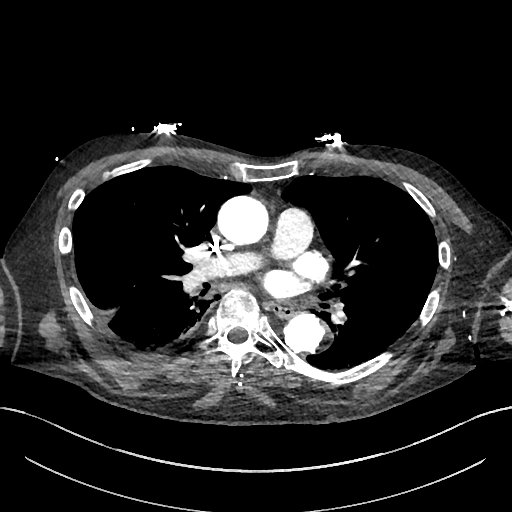
[im 264/340  bone]
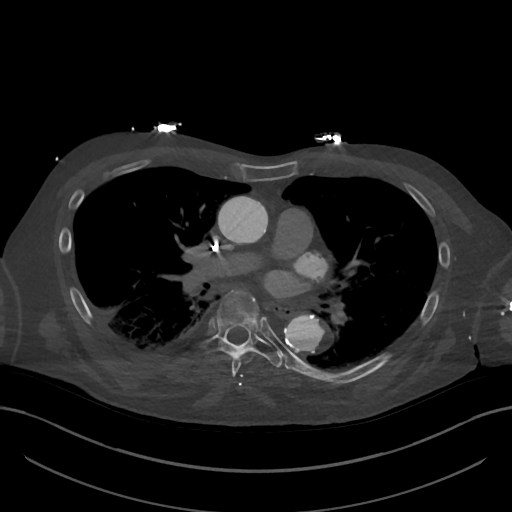
[im 283/340  soft-tissue]
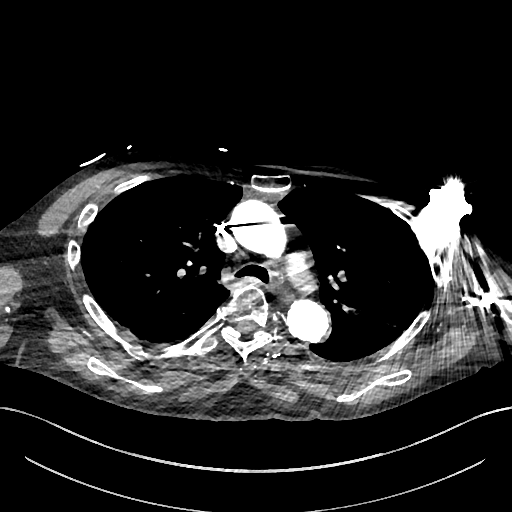
[im 321/340  soft-tissue]
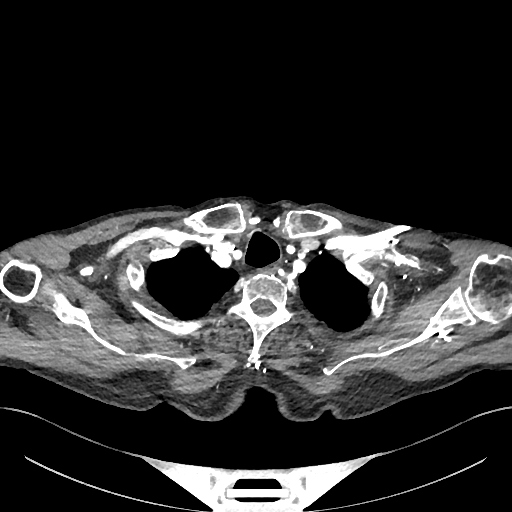

[Series 11: cor · coronal · 0.87mm/px · 3 of 150 slices shown]
[im 38/150  soft-tissue]
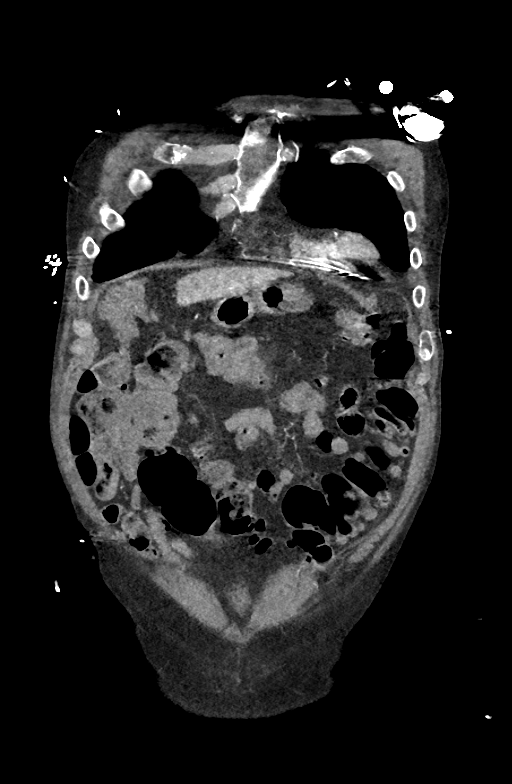
[im 75/150  soft-tissue]
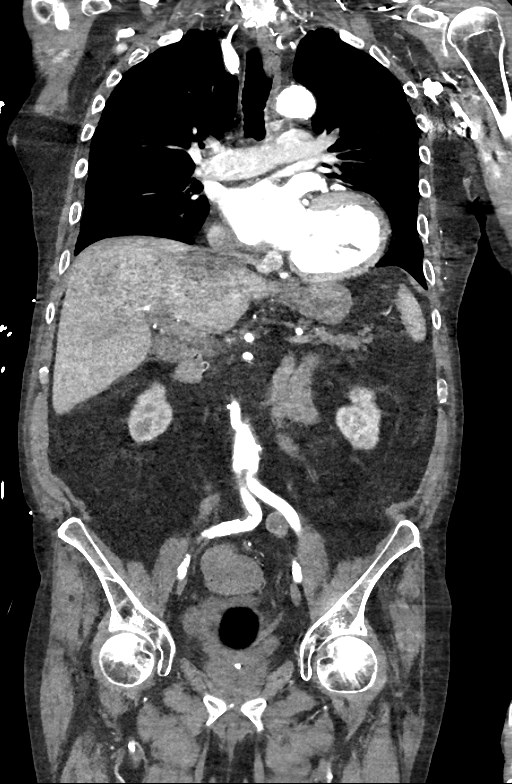
[im 112/150  soft-tissue]
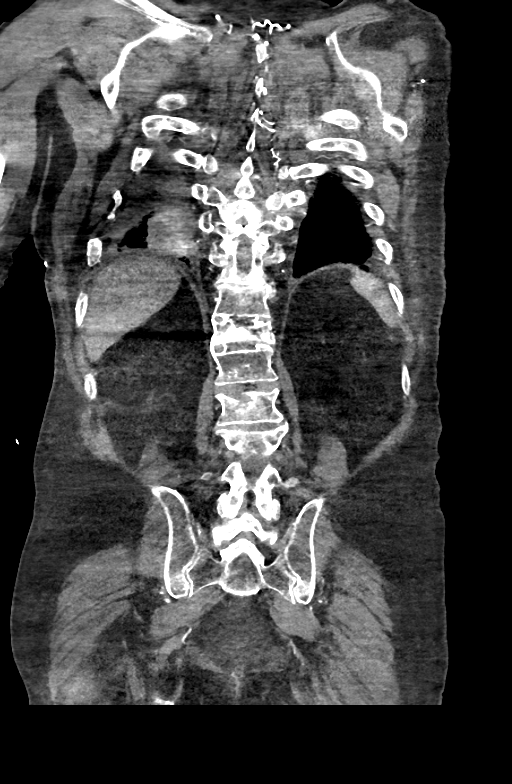

[14 of 46 positions shown; findings below may reference images not displayed]

FINDINGS: CTA CHEST FINDINGS

Cardiovascular: Severe diffuse aortic atherosclerosis. Irregular
calcified and noncalcified plaque noted throughout the descending
thoracic aorta. Probable penetrating ulcer noted along the posterior
wall of the distal descending thoracic aorta. Cardiomegaly. Diffuse
coronary artery calcifications. Pacer wires present in the right
heart. No evidence of aortic dissection.

Mediastinum/Nodes: No mediastinal, hilar, or axillary adenopathy.

Lungs/Pleura: Mild emphysema. Biapical scarring. Rounded airspace
opacity noted at the right lung base posteriorly could reflect
rounded pneumonia or atelectasis. No confluent opacity on the left.

Musculoskeletal: No acute bony abnormality. Prior vertebroplasty at
T12. Old right rib fractures noted.

Review of the MIP images confirms the above findings.

CTA ABDOMEN AND PELVIS FINDINGS

VASCULAR

Aorta: Irregular calcified and noncalcified plaque throughout the
aorta. No evidence of aneurysm or dissection.

Celiac: Widely patent, calcified.

SMA: Widely patent, calcified

Renals: Widely patent, calcified.

IMA: Patent

Inflow: Calcified iliac vessels. No aneurysm or dissection. There is
a thrombosed left internal iliac aneurysm measuring 1.8 cm.

Veins: Grossly unremarkable.

Review of the MIP images confirms the above findings.

NON-VASCULAR

Hepatobiliary: No focal hepatic abnormality. Gallbladder
unremarkable.

Pancreas: No focal abnormality or ductal dilatation.

Spleen: No focal abnormality.  Normal size.

Adrenals/Urinary Tract: Area of decreased enhancement noted
posteriorly in the left kidney could reflect area of infarct. No
hydronephrosis or suspicious renal mass. Adrenal glands and urinary
bladder unremarkable.

Stomach/Bowel: Colonic diverticulosis. No active diverticulitis.
Stomach and small bowel decompressed, unremarkable. Large stool
burden in the rectosigmoid colon.

Lymphatic: No adenopathy

Reproductive: Central calcifications within the prostate.

Other: No free fluid or free air.

Musculoskeletal: Severe compression deformities noted at L2 and L3,
likely chronic. Prior vertebroplasty at T12. Diffuse degenerative
changes. No acute bony abnormality.

Review of the MIP images confirms the above findings.
IMPRESSION: Diffuse irregular aortic atherosclerotic plaque. Possible ulcerative
plaque in the distal descending thoracic aorta posteriorly. No
evidence of aneurysm or dissection.

Cardiomegaly, coronary artery disease.

Rounded airspace opacity posteriorly in the right lower lobe could
reflect rounded pneumonia or atelectasis.

Area of decreased enhancement posteriorly in the left kidney
suspicious for renal infarct.

Sigmoid diverticulosis. Moderate stool burden in the rectosigmoid
colon.

Chronic appearing compression deformities at L2 and L3. Prior
vertebroplasty at T12.

## 2019-01-27 MED ORDER — BUDESONIDE 0.5 MG/2ML IN SUSP: 0.5000 mg | Freq: Two times a day (BID) | RESPIRATORY_TRACT | 0 refills | Status: AC

## 2019-01-27 NOTE — Telephone Encounter (Signed)
Returned call to [name] to make aware refill sent to [facility] per her request. They are still awaiting mail order. Nothing further needed.

## 2019-01-27 NOTE — Telephone Encounter (Signed)
This was taken care of. Will close encounter.

## 2019-01-29 ENCOUNTER — Other Ambulatory Visit: Payer: Self-pay

## 2019-01-29 ENCOUNTER — Ambulatory Visit (INDEPENDENT_AMBULATORY_CARE_PROVIDER_SITE_OTHER): Payer: Medicare Other | Admitting: *Deleted

## 2019-01-29 DIAGNOSIS — I472 Ventricular tachycardia, unspecified: Secondary | ICD-10-CM

## 2019-01-29 LAB — CUP PACEART REMOTE DEVICE CHECK
Battery Remaining Longevity: 81 mo
Battery Voltage: 2.99 V
Brady Statistic AP VP Percent: 0.06 %
Brady Statistic AP VS Percent: 99.43 %
Brady Statistic AS VP Percent: 0 %
Brady Statistic AS VS Percent: 0.5 %
Brady Statistic RA Percent Paced: 99.49 %
Brady Statistic RV Percent Paced: 0.06 %
Date Time Interrogation Session: 20200409100604
HighPow Impedance: 44 Ohm
HighPow Impedance: 58 Ohm
Implantable Lead Implant Date: 20090623
Implantable Lead Implant Date: 20090623
Implantable Lead Location: 753859
Implantable Lead Location: 753860
Implantable Lead Model: 5076
Implantable Lead Model: 6947
Implantable Pulse Generator Implant Date: 20170616
Lead Channel Impedance Value: 304 Ohm
Lead Channel Impedance Value: 361 Ohm
Lead Channel Impedance Value: 399 Ohm
Lead Channel Pacing Threshold Amplitude: 0.875 V
Lead Channel Pacing Threshold Amplitude: 1 V
Lead Channel Pacing Threshold Pulse Width: 0.4 ms
Lead Channel Pacing Threshold Pulse Width: 0.4 ms
Lead Channel Sensing Intrinsic Amplitude: 0.625 mV
Lead Channel Sensing Intrinsic Amplitude: 17.375 mV
Lead Channel Setting Pacing Amplitude: 2 V
Lead Channel Setting Pacing Amplitude: 2.5 V
Lead Channel Setting Pacing Pulse Width: 0.4 ms
Lead Channel Setting Sensing Sensitivity: 0.6 mV

## 2019-02-02 ENCOUNTER — Other Ambulatory Visit: Payer: Self-pay | Admitting: Pulmonary Disease

## 2019-02-06 ENCOUNTER — Encounter: Payer: Self-pay | Admitting: Cardiology

## 2019-02-06 NOTE — Progress Notes (Signed)
Remote ICD transmission.   

## 2019-02-08 ENCOUNTER — Encounter: Payer: Self-pay | Admitting: Family Medicine

## 2019-02-08 ENCOUNTER — Other Ambulatory Visit: Payer: Self-pay | Admitting: Cardiology

## 2019-02-09 ENCOUNTER — Other Ambulatory Visit: Payer: Self-pay | Admitting: *Deleted

## 2019-02-09 NOTE — Telephone Encounter (Signed)
Furosemide 20 mg refilled. 

## 2019-02-09 NOTE — Patient Outreach (Signed)
[facility]) Care Management  02/09/2019  [name] June 17, 1931 [id number]   Medicare High Risk Screening Call   Successful outreach to [name] for Medicare high risk screening call.  2 HIPAA identifiers verified and purpose of call explained.  Subjective:  [name] says he is doing "as well as can be expected" and declines offer of [facility] services but agrees to look over the information mailed to him and will contact this RNCM if he wishes to receive the services.    Plan:  Verified home address; will send successful outreach letter with [facility] program brochure and 24 hr nurse line magnet.   Barrington Ellison RN,CCM,CDE [facility] Coordinator Office Phone [phone] Office Fax [phone]

## 2019-02-09 NOTE — Telephone Encounter (Signed)
Please get this completed for them and let them know once done so they can pick up (or we could mail to them).

## 2019-02-09 NOTE — Telephone Encounter (Signed)
Last seen in 2/20. Does he need a visit for this?

## 2019-02-15 ENCOUNTER — Other Ambulatory Visit: Payer: Self-pay | Admitting: Cardiology

## 2019-02-16 NOTE — Telephone Encounter (Signed)
Midodrine 2.5 mg refilled

## 2019-02-22 ENCOUNTER — Other Ambulatory Visit: Payer: Self-pay | Admitting: Pulmonary Disease

## 2019-02-23 ENCOUNTER — Ambulatory Visit (INDEPENDENT_AMBULATORY_CARE_PROVIDER_SITE_OTHER): Payer: Medicare Other

## 2019-02-23 ENCOUNTER — Other Ambulatory Visit: Payer: Self-pay

## 2019-02-23 ENCOUNTER — Other Ambulatory Visit: Payer: Self-pay | Admitting: Pulmonary Disease

## 2019-02-23 ENCOUNTER — Other Ambulatory Visit: Payer: Self-pay | Admitting: Cardiology

## 2019-02-23 DIAGNOSIS — I5042 Chronic combined systolic (congestive) and diastolic (congestive) heart failure: Secondary | ICD-10-CM

## 2019-02-23 DIAGNOSIS — Z95810 Presence of automatic (implantable) cardiac defibrillator: Secondary | ICD-10-CM | POA: Diagnosis not present

## 2019-02-23 NOTE — Telephone Encounter (Signed)
Amiodarone refilled.

## 2019-02-24 ENCOUNTER — Other Ambulatory Visit: Payer: Self-pay | Admitting: Pulmonary Disease

## 2019-02-24 ENCOUNTER — Telehealth: Payer: Self-pay | Admitting: *Deleted

## 2019-02-24 NOTE — Telephone Encounter (Signed)
Copied from [place] [id number]. Topic: Quick Communication - See Telephone Encounter >> Feb 24, 2019  9:42 AM Loma [place] wrote: CRM for notification. See Telephone encounter for: 02/24/19. Daughter is calling about an application for a handicap sticker that per mychart was supposed to be picked up at the front desk.  She is requesting that it be mailed to his [place] address if possible.

## 2019-02-24 NOTE — Telephone Encounter (Signed)
Placard mailed to the pts home address. 

## 2019-02-24 NOTE — Progress Notes (Signed)
EPIC Encounter for ICM Monitoring  Patient Name: [name] is a 85 y.o. male Date: 02/24/2019 Primary Care Physican: Caren Macadam, MD Primary Cardiologist: Hochrein Electrophysiologist: Faustino Congress Weight:unknown  Transmission reviewed.   Thoracic impedance normal.   Prescribed: Furosemide20 mg 1 tabletdaily.Potassium 10 mEq 1 tabletdaily.   Labs: 10/08/2018 Creatinine 1.17, BUN 26, Potassium 4.5, Sodium 138 08/21/2018 Creatinine 1.19, BUN 17, Potassium 5.2, Sodium 139, GFR 55-64 07/24/2018 Creatinine1.03, BUN17, Potassium3.5, Sodium133, GFR>60 A complete set of results can be found in Results Review.  Recommendations: No changes  Follow-up plan: ICM clinic phone appointment on6/05/2019.  Copy of ICM check sent to Dr.Allred.   3 month ICM trend: 02/23/2019    1 Year ICM trend:       [name], RN 02/24/2019 5:02 PM

## 2019-02-25 ENCOUNTER — Other Ambulatory Visit: Payer: Self-pay | Admitting: Pulmonary Disease

## 2019-02-25 ENCOUNTER — Other Ambulatory Visit: Payer: Self-pay | Admitting: Cardiology

## 2019-02-26 ENCOUNTER — Other Ambulatory Visit: Payer: Self-pay | Admitting: Family Medicine

## 2019-02-26 ENCOUNTER — Encounter: Payer: Self-pay | Admitting: Family Medicine

## 2019-02-26 MED ORDER — SERTRALINE HCL 50 MG PO TABS: 50.0000 mg | ORAL_TABLET | Freq: Every day | ORAL | 1 refills | Status: DC

## 2019-02-26 MED ORDER — OXYBUTYNIN CHLORIDE 5 MG PO TABS: 5.0000 mg | ORAL_TABLET | Freq: Two times a day (BID) | ORAL | 1 refills | Status: DC

## 2019-02-26 MED ORDER — PRAVASTATIN SODIUM 40 MG PO TABS: 40.0000 mg | ORAL_TABLET | Freq: Every day | ORAL | 1 refills | Status: DC

## 2019-02-27 ENCOUNTER — Telehealth: Payer: Self-pay | Admitting: Family Medicine

## 2019-02-27 ENCOUNTER — Telehealth: Payer: Self-pay | Admitting: Pulmonary Disease

## 2019-02-27 ENCOUNTER — Other Ambulatory Visit: Payer: Self-pay

## 2019-02-27 NOTE — Telephone Encounter (Signed)
Copied from [place] [phone]. Topic: Quick Communication - Rx Refill/Question >> Feb 27, 2019  4:39 PM Nils Flack wrote: Medication: oxybutynin (DITROPAN) 5 MG tablet, sertraline (ZOLOFT) 50 MG tablet  Has the patient contacted their pharmacy? Yes.   (Agent: If no, request that the patient contact the pharmacy for the refill.) (Agent: If yes, when and what did the pharmacy advise?)  Preferred Pharmacy (with phone number or street name): [facility]  Pharm states they did not get the refills. Asking to re-send.    Agent: Please be advised that RX refills may take up to 3 business days. We ask that you follow-up with your pharmacy.

## 2019-02-27 NOTE — Telephone Encounter (Signed)
Call made to [facility], made aware this medication needs to be refilled via his PCP. Voiced understanding. Nothing further is needed at this time.

## 2019-03-01 ENCOUNTER — Other Ambulatory Visit: Payer: Self-pay | Admitting: Pulmonary Disease

## 2019-03-01 ENCOUNTER — Other Ambulatory Visit: Payer: Self-pay | Admitting: Cardiology

## 2019-03-02 ENCOUNTER — Other Ambulatory Visit: Payer: Self-pay

## 2019-03-02 ENCOUNTER — Telehealth: Payer: Self-pay | Admitting: Pulmonary Disease

## 2019-03-02 ENCOUNTER — Other Ambulatory Visit: Payer: Self-pay | Admitting: *Deleted

## 2019-03-02 MED ORDER — OXYBUTYNIN CHLORIDE 5 MG PO TABS: 5.0000 mg | ORAL_TABLET | Freq: Two times a day (BID) | ORAL | 1 refills | Status: DC

## 2019-03-02 MED ORDER — POTASSIUM CHLORIDE ER 10 MEQ PO TBCR: 10.0000 meq | EXTENDED_RELEASE_TABLET | Freq: Every day | ORAL | 3 refills | Status: DC

## 2019-03-02 MED ORDER — SERTRALINE HCL 50 MG PO TABS: 50.0000 mg | ORAL_TABLET | Freq: Every day | ORAL | 1 refills | Status: DC

## 2019-03-02 MED ORDER — ALLOPURINOL 100 MG PO TABS: 100.0000 mg | ORAL_TABLET | Freq: Every day | ORAL | 3 refills | Status: DC

## 2019-03-02 NOTE — Telephone Encounter (Signed)
These are primary care medications, will need them refilled through PCP

## 2019-03-02 NOTE — Telephone Encounter (Signed)
Rx done. 

## 2019-03-02 NOTE — Telephone Encounter (Signed)
k-dur refilled. 

## 2019-03-02 NOTE — Telephone Encounter (Signed)
[name] has refill these medication per pharmacy. Nothing further needed.

## 2019-03-02 NOTE — Telephone Encounter (Signed)
TP please advise on refill of Allopurinol and Potassium  Former Dr. Lenna Gilford patient now followed by Dr. Valeta Harms.  Pharmacy requesting refills of both.  Pt saw Dr. Valeta Harms 12/10/18   PCP now Ethlyn Gallery

## 2019-03-03 NOTE — Telephone Encounter (Signed)
Medications already refilled per Hershal Coria.  Will close message.

## 2019-03-13 ENCOUNTER — Telehealth: Payer: Self-pay | Admitting: Cardiology

## 2019-03-13 NOTE — Telephone Encounter (Signed)
Phone visit only, pre-reg complete, mychart active, verbal consent given 03/13/2019 MS
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
## 2019-03-17 ENCOUNTER — Telehealth (INDEPENDENT_AMBULATORY_CARE_PROVIDER_SITE_OTHER): Payer: Medicare Other | Admitting: Cardiology

## 2019-03-17 ENCOUNTER — Encounter: Payer: Self-pay | Admitting: Cardiology

## 2019-03-17 VITALS — BP 111/76 | Ht 71.0 in | Wt 167.6 lb

## 2019-03-17 DIAGNOSIS — I5042 Chronic combined systolic (congestive) and diastolic (congestive) heart failure: Secondary | ICD-10-CM | POA: Diagnosis not present

## 2019-03-17 DIAGNOSIS — Z7189 Other specified counseling: Secondary | ICD-10-CM | POA: Diagnosis not present

## 2019-03-17 DIAGNOSIS — I472 Ventricular tachycardia, unspecified: Secondary | ICD-10-CM

## 2019-03-17 NOTE — Patient Instructions (Signed)
Medication Instructions:  Continue current medications  If you need a refill on your cardiac medications before your next appointment, please call your pharmacy.  Labwork: None Ordered   Testing/Procedures: None Ordered  Follow-Up: You will need a follow up appointment in 6 months.  Please call our office 2 months in advance to schedule this appointment.  You may see Jesse Hochrein, MD or one of the following Advanced Practice Providers on your designated Care Team:   Rhonda Barrett, PA-C . Kathryn Lawrence, DNP, ANP    At [facility], you and your health needs are our priority.  As part of our continuing mission to provide you with exceptional heart care, we have created designated Provider Care Teams.  These Care Teams include your primary Cardiologist (physician) and Advanced Practice Providers (APPs -  Physician Assistants and Nurse Practitioners) who all work together to provide you with the care you need, when you need it.  Thank you for choosing [facility] at [facility]!!     

## 2019-03-18 NOTE — Telephone Encounter (Signed)
Patient's daughter had a question about patient's levothyroxine. Medication on patient's med list is currently for 24mcg. There was a prescription from January 2020 from Dr. Ethlyn Gallery for 97mcg in patient's chart as well. There is no telephone encounter stating why the medication was decreased.   Dr. Valeta Harms, do you remember why or how the medication was changed? It appears to have been changed on 01/06/19.

## 2019-03-19 ENCOUNTER — Encounter: Payer: Self-pay | Admitting: Family Medicine

## 2019-03-20 ENCOUNTER — Telehealth: Payer: Self-pay | Admitting: *Deleted

## 2019-03-20 DIAGNOSIS — I472 Ventricular tachycardia, unspecified: Secondary | ICD-10-CM | POA: Insufficient documentation

## 2019-03-20 DIAGNOSIS — E039 Hypothyroidism, unspecified: Secondary | ICD-10-CM

## 2019-03-20 DIAGNOSIS — Z7189 Other specified counseling: Secondary | ICD-10-CM | POA: Insufficient documentation

## 2019-03-20 NOTE — Telephone Encounter (Signed)
I left a detailed message at Cindy's cell number with the information below.

## 2019-03-20 NOTE — Telephone Encounter (Signed)
-----   Message from Caren Macadam, MD sent at 03/20/2019 11:30 AM EDT ----- See [name] message from daughter - needs repeat TSH done so we can properly dose synthroid. Lots of confusion regarding dose sent in and what he is supposed to be taking.   If you can get her to tell you what he has been doing that would help.  (he was supposed to be on 57mcg daily but this was not what was sent in)

## 2019-03-30 ENCOUNTER — Other Ambulatory Visit: Payer: Self-pay

## 2019-03-30 ENCOUNTER — Ambulatory Visit (INDEPENDENT_AMBULATORY_CARE_PROVIDER_SITE_OTHER): Payer: Medicare Other

## 2019-03-30 ENCOUNTER — Other Ambulatory Visit: Payer: Self-pay | Admitting: Family Medicine

## 2019-03-30 ENCOUNTER — Other Ambulatory Visit (INDEPENDENT_AMBULATORY_CARE_PROVIDER_SITE_OTHER): Payer: Medicare Other

## 2019-03-30 DIAGNOSIS — Z95810 Presence of automatic (implantable) cardiac defibrillator: Secondary | ICD-10-CM | POA: Diagnosis not present

## 2019-03-30 DIAGNOSIS — I5042 Chronic combined systolic (congestive) and diastolic (congestive) heart failure: Secondary | ICD-10-CM

## 2019-03-30 DIAGNOSIS — E039 Hypothyroidism, unspecified: Secondary | ICD-10-CM | POA: Diagnosis not present

## 2019-03-30 LAB — TSH: TSH: 11.21 u[IU]/mL — ABNORMAL HIGH (ref 0.35–4.50)

## 2019-03-30 NOTE — Telephone Encounter (Signed)
Per [name] the pt is here now for labs.  After reviewing this note and previous labs, orders for a TSH was entered.

## 2019-04-01 ENCOUNTER — Telehealth: Payer: Self-pay

## 2019-04-01 DIAGNOSIS — J449 Chronic obstructive pulmonary disease, unspecified: Secondary | ICD-10-CM

## 2019-04-01 NOTE — Telephone Encounter (Signed)
Dr. Valeta Harms, please see mychart message from pt's daughter [name] which I have posted below and advise recommendations for both [name] and pt. Thanks!  ----- Message -----  From: [name]  Sent: 6/10/20209:30 AM EDT  To: Garner Nash, DO Subject: Visit Follow-Up Question  Hello -- This is Jesse, [name] daughter.I'm staying with him this week and he said he's been having trouble keeping the cannula in place during the night when he uses oxygen.He had most of his ear removed due to skin cancer, so I'm wondering if there's another design that goes around the head or if we should tape it to keep it in place? Thank you. [name]

## 2019-04-01 NOTE — Telephone Encounter (Signed)
Remote ICM transmission received.  Attempted call to daughter Marshell Levan, regarding ICM remote transmission and left detailed message, per DPR, with next ICM remote transmission date of 05/04/2019.  Advised to return call for any fluid symptoms or questions.

## 2019-04-01 NOTE — Progress Notes (Signed)
EPIC Encounter for ICM Monitoring  Patient Name: [name] is a 85 y.o. male Date: 04/01/2019 Primary Care Physican: Jesse Macadam, MD Primary Cardiologist: [name] Electrophysiologist: [name] Weight:unknown  Attempted call todaughter [name]. Transmission reviewed.   Thoracic impedance normal.   Prescribed: Furosemide20 mg 1 tabletdaily.Potassium 10 mEq 1 tabletdaily.   Labs: 10/08/2018 Creatinine 1.17, BUN 26, Potassium 4.5, Sodium 138 08/21/2018 Creatinine 1.19, BUN 17, Potassium 5.2, Sodium 139, GFR 55-64 07/24/2018 Creatinine1.03, BUN17, Potassium3.5, Sodium133, GFR>60 A complete set of results can be found in Results Review.  Recommendations:No changes  Follow-up plan: ICM clinic phone appointment on6/05/2019.  Copy of ICM check sent to [name].  3 month ICM trend: 03/30/2019    1 Year ICM trend:       Jesse Billings, RN 04/01/2019 12:40 PM

## 2019-04-01 NOTE — Telephone Encounter (Signed)
Dr. Valeta Harms, this pt would like to try the open face mask. Here is the original response below:  To: [facility] POOL    From: Zakariye Nee    Created: 04/01/2019 3:42 PM     Thanks for the response.He'd like to try the open face mask so please let us know how to proceed. Marshell Levan (on behalf of Jonnie Truxillo)  BI, please advise if you would be okay with [place] putting in the order for a simple face mask to pt's DME. Thank you.

## 2019-04-02 NOTE — Telephone Encounter (Signed)
Yes. That would fine. Thanks.

## 2019-04-02 NOTE — Telephone Encounter (Signed)
[name], Dr. Peyton Bottoms for the order for an open face mask for patient d/t difficulty keeping his nasal cannula in place.  However, per [facility] he is not listed as available to cosign the order so that PCCs may send it to [facility].  May triage redo the order under your name so that patient may get taken care of?  Thanks, Triage

## 2019-04-03 ENCOUNTER — Telehealth: Payer: Self-pay | Admitting: *Deleted

## 2019-04-03 ENCOUNTER — Other Ambulatory Visit: Payer: Self-pay | Admitting: Family Medicine

## 2019-04-03 ENCOUNTER — Encounter: Payer: Self-pay | Admitting: Family Medicine

## 2019-04-03 ENCOUNTER — Telehealth: Payer: Self-pay | Admitting: Pulmonary Disease

## 2019-04-03 DIAGNOSIS — E039 Hypothyroidism, unspecified: Secondary | ICD-10-CM

## 2019-04-03 MED ORDER — LEVOTHYROXINE SODIUM 75 MCG PO TABS: 75.0000 ug | ORAL_TABLET | Freq: Every day | ORAL | 1 refills | Status: DC

## 2019-04-03 NOTE — Telephone Encounter (Signed)
There is nothing that I can do other than place the order, which has already been done. If they do not have this mask in stock then she can purchase it online if she has found one. Thanks.

## 2019-04-03 NOTE — Telephone Encounter (Signed)
Order has been placed for the open face mask. Routing to [place] so she knows that the order needs to be signed off on.

## 2019-04-03 NOTE — Progress Notes (Signed)
Addressed in other message.

## 2019-04-03 NOTE — Telephone Encounter (Signed)
Called and spoke with pt's daughter [name] who wanted to confirm that an order had been placed for the face mask for pt's O2. I stated to [name] that an order was placed that we were just waiting for the order to be cosigned by the provider so then we can have it sent to [place].  Per [name], she had called [facility] earlier and was told by them that they did not think they had any face masks there in stock and per [facility], she has called other DMEs and she had been told my multiple DMEs that none of them have full face masks in stock.  [name] stated she has gone on line and has found a full face mask on there but was hesitant in ordering it as she was afraid she would order the wrong thing.  Tonya, please advise on all of this for pt and daughter [name]. This is a follow up call from [place] message from 6/10.

## 2019-04-03 NOTE — Telephone Encounter (Signed)
I have sent in 25mcg daily dose.   I sent cindy mychart message.   Please order recheck TSH for 8 weeks time. Make sure no further questions as there was much confusion in recent weeks. Thanks!

## 2019-04-03 NOTE — Telephone Encounter (Signed)
I sent an order to [facility] this afternoon once the order was signed by [place].

## 2019-04-03 NOTE — Telephone Encounter (Signed)
Copied from [place] [id number]. Topic: General - Other >> Apr 03, 2019  1:15 PM Mcneil, Ja-Kwan wrote: Reason for CRM: Pt daughter [name] stated she will be leaving today and would like a call back regarding the [facility] message that she sent pertaining to the change in dosage of pt medication. [name] stated she would like to get this resolved before she leaves town. Cb# [phone]

## 2019-04-03 NOTE — Telephone Encounter (Signed)
Can someone please check on status. 2 orders have been placed. Per patients daughter. Per [facility] they don't have an order.

## 2019-04-06 NOTE — Telephone Encounter (Signed)
I left a detailed message with the information below, asked that she call back for a lab appt and with any further questions if needed.

## 2019-04-06 NOTE — Telephone Encounter (Signed)
Spoke with patient's daughter [name]. She stated that she was contacted by [facility] and was told that they did offer the type of mask that she was looking for.   While on the phone with her, I did a quick search online to see if I could find the type of mask she had described. Found one through [employer]. She has requested that I send the link via MyChart, advised her that I would do so.   Nothing further needed in this encounter.

## 2019-04-07 ENCOUNTER — Telehealth: Payer: Self-pay

## 2019-04-07 NOTE — Telephone Encounter (Signed)
Spoke with pt dtr, instructed for patient to double furosemide for the next 3 days then he will go back to every other day dosing. They will call with no improvement.

## 2019-04-07 NOTE — Telephone Encounter (Signed)
Spoke with [name].  Advised I received a mychart message from Dr Princella Pellegrini office that a remote transmission is needed to check if patient has fluid accumulation.  She is not physically with him but will call and instruct him how to send a manual transmission.  Advised to call back if he has any problems.  When transmission is received, will send the results to Dr Warren Lacy and his nurse Fredia Beets.

## 2019-04-07 NOTE — Telephone Encounter (Signed)
Remote transmission received.  Optivol thoracic impedance suggests possible fluid accumulation starting 03/31/2019 and is ongoing.  Note routed to Fredia Beets, RN for follow up.   Will recheck fluid levels on 04/13/2019.

## 2019-04-07 NOTE — Telephone Encounter (Signed)
Daughter called back to check if report was sent from patient. Advised received report and the possible fluid accumulation started 6/9.  Advised report sent to Dr Rosezella [place] nurse and once she reviews it with him she will call or send recommendations via my chart. She said either one is fine.

## 2019-04-07 NOTE — Telephone Encounter (Signed)
I called the pts daughter [name] and informed her the order was entered.  Lab appt scheduled for 8/17.

## 2019-04-12 ENCOUNTER — Other Ambulatory Visit: Payer: Self-pay | Admitting: Rheumatology

## 2019-04-13 ENCOUNTER — Ambulatory Visit (INDEPENDENT_AMBULATORY_CARE_PROVIDER_SITE_OTHER): Payer: Medicare Other

## 2019-04-13 ENCOUNTER — Encounter: Payer: Self-pay | Admitting: Rheumatology

## 2019-04-13 DIAGNOSIS — Z95810 Presence of automatic (implantable) cardiac defibrillator: Secondary | ICD-10-CM

## 2019-04-13 DIAGNOSIS — I5042 Chronic combined systolic (congestive) and diastolic (congestive) heart failure: Secondary | ICD-10-CM

## 2019-04-13 NOTE — Telephone Encounter (Addendum)
Last Visit: 10/07/18 Next visit :04/15/19 Labs: 10/08/18 BUN 26 AST 41 Hgb 12.7 Hct 38.6 Platelets 138   Patient to update labs at his appointment on 04/15/19.  Okay to refill 30 day supply per Dr. Estanislado Pandy

## 2019-04-13 NOTE — Progress Notes (Deleted)
Office Visit Note  Patient: [name]             Date of Birth: 07/01/1931           MRN: [id number]             PCP: Caren Macadam, MD Referring: Caren Macadam, MD Visit Date: 04/15/2019 Occupation: @GUAROCC @  Subjective:  No chief complaint on file.   History of Present Illness: [name] is a 85 y.o. male ***   Activities of Daily Living:  Patient reports morning stiffness for *** {minute/hour:19697}.   Patient {ACTIONS;DENIES/REPORTS:21021675::"Denies"} nocturnal pain.  Difficulty dressing/grooming: {ACTIONS;DENIES/REPORTS:21021675::"Denies"} Difficulty climbing stairs: {ACTIONS;DENIES/REPORTS:21021675::"Denies"} Difficulty getting out of chair: {ACTIONS;DENIES/REPORTS:21021675::"Denies"} Difficulty using hands for taps, buttons, cutlery, and/or writing: {ACTIONS;DENIES/REPORTS:21021675::"Denies"}  No Rheumatology ROS completed.   PMFS History:  Patient Active Problem List   Diagnosis Date Noted  . Educated About Covid-19 Virus Infection 03/20/2019  . VT (ventricular tachycardia) ([place]) 03/20/2019  . Chronic hypoxemic respiratory failure ([place]) 08/26/2018  . Gait abnormality 08/26/2018  . Lobar pneumonia, unspecified organism ([place]) 08/04/2018  . Lower GI bleeding 07/21/2018  . Forehead laceration, initial encounter 07/21/2018  . CKD (chronic kidney disease) stage 3, GFR 30-59 ml/min (HCC) 07/21/2018  . History of pneumonia 07/15/2018  . Physical deconditioning 11/13/2017  . Weakness 11/12/2017  . Falls 11/12/2017  . Pleural effusion   . Pressure injury of skin 10/01/2017  . CHF exacerbation ([place]) 09/30/2017  . Shingles outbreak 05/27/2017  . Seborrheic dermatitis 05/27/2017  . Acute on chronic respiratory failure with hypoxia ([place]) 01/13/2017  . COPD with acute bronchitis ([place]) 01/13/2017  . CAD S/P percutaneous coronary angioplasty   . Chronic combined systolic and diastolic CHF (congestive heart failure) ([place])   . Blunt chest  trauma 05/04/2016  . 4.1 cm Aneurysm of thoracic aorta ([place]) 04/27/2016  . SIADH (syndrome of inappropriate ADH production) ([place])   . Orthostatic hypotension 04/12/2016  . TBI (traumatic brain injury) ([place])   . Acute on chronic combined systolic and diastolic congestive heart failure ([place])   . S/P ORIF (open reduction internal fixation) fracture   . Thrombocytopenia ([place])   . Urinary retention   . Slow transit constipation   . Thyroid activity decreased   . Traumatic subdural hematoma with loss of consciousness ([place])   . Pulmonary hypertension ([place])   . Cardiomyopathy, ischemic   . Subdural hematoma ([place])   . Cardiac arrest ([place]) 03/29/2016  . 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 CAP (community acquired pneumonia) 04/28/2015  . T wave over sensing resulting in inappropriate shocks 08/14/2013  . Vitamin D deficiency disease 04/09/2012  . Actinic skin damage 05/22/2011  . Alcohol use 03/22/2011  . Other dysphagia 02/16/2011  . Carotid arterial disease ([place]) 11/17/2009  . GOUT 09/23/2009  . Age-related osteoporosis with current pathological fracture 09/23/2009  . SYNCOPE 04/19/2009  . Closed fracture of bone 07/27/2008  . Automatic implantable cardioverter-defibrillator in situ 04/13/2008  . COLONIC POLYPS 12/09/2007  . Diverticulosis of colon 12/09/2007  . Essential hypertension 12/08/2007  . Peripheral vascular disease ([place]) 12/08/2007  . COPD GOLD III 12/08/2007  . Osteoarthritis 12/08/2007    Past Medical History:  Diagnosis Date  . AAA (abdominal aortic aneurysm) ([place])    a. 2002 s/p repair.  . Allergic rhinitis   . Back pain   . CAD (coronary artery disease)    a. 02/2002 H/o MI with stenting x 2; b. 04/2013 MV: EF 25%, large posterior lateral  infarct w/o ischemia; c. 03/2016 VT Arrest/Cath: LM 60ost, LAD 40p/m ISR, LCX 100p/m, RCA nl-->Med Rx.  . Carotid arterial disease ([place])    a. 12/2010 s/p R CEA;  b. 05/2015 Carotid U/S: bilat <40% ICA stenosis.  .  Chronic combined systolic and diastolic CHF (congestive heart failure) ([place])    a. 03/2016 Echo: Ef 35-40%, grade 1 DD.  Marland Kitchen Compression fracture   . COPD (chronic obstructive pulmonary disease) ([place])   . Dermatitis   . Diverticulosis of colon   . DJD (degenerative joint disease)    and Gout  . Hemorrhoids   . History of colonic polyps   . History of gout   . History of pneumonia   . Hypercholesterolemia   . Hypertension   . Hypertensive heart disease   . Ischemic cardiomyopathy    a. EF prev <35%-->improved to normal by Echo 8/14:  Mild LVH, focal basal hypertrophy, EF 60-65%, normal wall motion, mild BAE, PASP 36; c. 03/2016 Echo: EF 35-40%, Gr1 DD, triv AI, mild MR, mod dil LA, mild-mod TR, PASP 85mmHg.  . Osteoporosis   . Peripheral vascular disease ([place])   . Presence of cardiac defibrillator    a. 03/2008 s/p MDT [id number] Maximo II DR, DC AICD; b. 03/2013: ICD shock for T wave oversensing;  c. 10/2015: collective decision not to replace ICD given improvement in LV fxn; d. VT Arrest-->Gen change to MDT ser # [id number].  . Skin cancer    shoulders and forehead  . Syncope   . T wave over sensing resulting in inappropriate shocks    a. 03/2013.  . Tobacco abuse   . Ventricular tachycardia ([place]) 04/01/2016    Family History  Problem Relation Age of Onset  . Hypertension Father   . Heart disease Father 47       Heart Disease before age 58  . Alcohol abuse Father        causing multiple medical problems; uncertain exact cause of death  . Hypertension Mother   . Heart disease Mother        Heart Disease before age 62  . Cancer Mother        pancreatic  . Scoliosis Sister   . Thyroid disease Daughter   . Anxiety disorder Son    Past Surgical History:  Procedure Laterality Date  . ABDOMINAL AORTIC ANEURYSM REPAIR  2002   by Dr. Kellie Simmering  . aicd placed  03/2008   Dr. Caryl Comes  . cad stent  02/2002   Dr. Percival Spanish  . CARDIAC CATHETERIZATION     X 2 stents  . CARDIAC CATHETERIZATION N/A  04/03/2016   Procedure: Left Heart Cath and Coronary Angiography;  Surgeon: Belva Crome, MD;  Location: [facility];  Service: Cardiovascular;  Laterality: N/A;  . CARDIAC DEFIBRILLATOR PLACEMENT  2009  . CARDIAC DEFIBRILLATOR PLACEMENT    . CAROTID ENDARTERECTOMY Right January 02, 2011  . EAR CYST EXCISION Left 12/13/2015   Procedure: Excision left ear lesion ;  Surgeon: Melissa Montane, MD;  Location: [facility];  Service: ENT;  Laterality: Left;  . EP IMPLANTABLE DEVICE N/A 04/06/2016   Procedure:  ICD Generator Changeout;  Surgeon: Deboraha Sprang, MD;  Location: [facility];  Service: Cardiovascular;  Laterality: N/A;  . EXCISION OF LESION LEFT EAR Left 12/13/2015  . EXTERNAL EAR SURGERY Left    Cancer Removal from left ear  . I&D EXTREMITY Left 04/23/2016   Procedure: IRRIGATION AND DEBRIDEMENT HAND;  Surgeon:  Leandrew Koyanagi, MD;  Location: [facility];  Service: Orthopedics;  Laterality: Left;  . IR IMAGE GUIDED DRAINAGE BY PERCUTANEOUS CATHETER  10/08/2017  . lumbar back     done after compression fracture  . OPEN REDUCTION INTERNAL FIXATION (ORIF) METACARPAL Left 04/04/2016   Procedure: OPEN REDUCTION INTERNAL FIXATION (ORIF) LEFT 2ND, 3RD, 4TH METACARPAL FRACTURE;  Surgeon: Leandrew Koyanagi, MD;  Location: [facility];  Service: Orthopedics;  Laterality: Left;  OPEN REDUCTION INTERNAL FIXATION (ORIF) LEFT 2ND, 3RD, 4TH METACARPAL FRACTURE  . OPEN REDUCTION INTERNAL FIXATION (ORIF) METACARPAL Left 04/23/2016   Procedure: REVISION OPEN REDUCTION INTERNAL FIXATION (ORIF) 2ND METACARPAL;  Surgeon: Leandrew Koyanagi, MD;  Location: [facility];  Service: Orthopedics;  Laterality: Left;  . right corotid enderectomy  12/2010   Dr. Scot Dock  . SKIN SPLIT GRAFT Left 12/13/2015   Procedure: with possible skin graft;  Surgeon: Melissa Montane, MD;  Location: [facility];  Service: ENT;  Laterality: Left;  . TONSILLECTOMY     Social History   Social History Narrative   ** Merged History Encounter **       Lives locally.  Had been  living with wife who had become quite ill recently and died on the evening of 04-01-16.   Immunization History  Administered Date(s) Administered  . H1N1 10/26/2008  . Influenza Split 08/23/2011, 07/15/2012  . Influenza Whole 07/14/2008, 08/22/2009, 09/04/2010  . Influenza, High Dose Seasonal PF 07/09/2016, 09/18/2017, 07/31/2018  . Influenza,inj,Quad PF,6+ Mos 07/07/2013, 07/07/2014, 07/23/2015  . Pneumococcal Conjugate-13 10/03/2015  . Pneumococcal Polysaccharide-23 07/12/2009  . Tdap 02/10/2018     Objective: Vital Signs: There were no vitals taken for this visit.   Physical Exam   Musculoskeletal Exam: ***  CDAI Exam: CDAI Score: - Patient Global: -; Provider Global: - Swollen: -; Tender: - Joint Exam   No joint exam has been documented for this visit   There is currently no information documented on the homunculus. Go to the Rheumatology activity and complete the homunculus joint exam.  Investigation: No additional findings.  Imaging: No results found.  Recent Labs: Lab Results  Component Value Date   WBC 8.8 10/08/2018   HGB 12.7 (L) 10/08/2018   PLT 138.0 (L) 10/08/2018   NA 138 10/08/2018   K 4.5 10/08/2018   CL 99 10/08/2018   CO2 33 (H) 10/08/2018   GLUCOSE 89 10/08/2018   BUN 26 (H) 10/08/2018   CREATININE 1.17 10/08/2018   BILITOT 0.5 10/08/2018   ALKPHOS 71 10/08/2018   AST 41 (H) 10/08/2018   ALT 36 10/08/2018   PROT 7.3 10/08/2018   ALBUMIN 3.8 10/08/2018   CALCIUM 9.2 10/08/2018   GFRAA 64 08/21/2018    Speciality Comments: No specialty comments available.  Procedures:  No procedures performed Allergies: Lisinopril   Assessment / Plan:     Visit Diagnoses: No diagnosis found.   Orders: No orders of the defined types were placed in this encounter.  No orders of the defined types were placed in this encounter.   Face-to-face time spent with patient was *** minutes. Greater than 50% of time was spent in counseling and  coordination of care.  Follow-Up Instructions: No follow-ups on file.   Earnestine Mealing, CMA  Note - This record has been created using Editor, commissioning.  Chart creation errors have been sought, but may not always  have been located. Such creation errors do not reflect on  the standard of medical care.

## 2019-04-13 NOTE — Progress Notes (Signed)
EPIC Encounter for ICM Monitoring  Patient Name: [name] is a 85 y.o. male Date: 04/13/2019 Primary Care Physican: Caren Macadam, MD Primary Cardiologist: Hochrein Electrophysiologist: Faustino Congress Weight:unknown  Call todaughter [name]. She reports patient is able to walk to mailbox without shortness of breath. His breathing is back to baseline and ankle swelling has resolved (wearing compression stockings.  Optivol thoracic impedance improved and trending slightly below baseline normal after taking extra Furosemide x 3 days as directed by Dr Hochrein's nurse, Fredia Beets, RN.    Prescribed: Furosemide20 mg 1 tabletdaily.Potassium 10 mEq 1 tabletdaily.   Labs: 10/08/2018 Creatinine 1.17, BUN 26, Potassium 4.5, Sodium 138 08/21/2018 Creatinine 1.19, BUN 17, Potassium 5.2, Sodium 139, GFR 55-64 07/24/2018 Creatinine1.03, BUN17, Potassium3.5, Sodium133, GFR>60 A complete set of results can be found in Results Review.  Recommendations: Advised will send copy to Dr Percival Spanish and Fredia Beets RN to show effectiveness of doubling Furosemide x 3 days.   Follow-up plan: ICM clinic phone appointment on7/13/2020.  Copy of ICM check sent to Dr.Allred and Dr Percival Spanish   3 month ICM trend: 04/13/2019    1 Year ICM trend:       [name], RN 04/13/2019 2:27 PM

## 2019-04-15 ENCOUNTER — Telehealth: Payer: Medicare Other | Admitting: Rheumatology

## 2019-04-17 ENCOUNTER — Telehealth: Payer: Medicare Other | Admitting: Rheumatology

## 2019-04-19 ENCOUNTER — Other Ambulatory Visit: Payer: Self-pay | Admitting: Pulmonary Disease

## 2019-04-20 ENCOUNTER — Telehealth: Payer: Self-pay | Admitting: Cardiology

## 2019-04-20 ENCOUNTER — Telehealth: Payer: Self-pay

## 2019-04-20 ENCOUNTER — Telehealth (HOSPITAL_COMMUNITY): Payer: Self-pay | Admitting: *Deleted

## 2019-04-20 NOTE — Telephone Encounter (Signed)
Received remote transmission and no alerts noted.  Phone note sent to device clinic triage for review.  Patient's [name] is contact person at [phone].

## 2019-04-20 NOTE — Telephone Encounter (Signed)
Returned call to [name] daughter, DPR on file.  She said patient reported he felt like his device shocked him during the night.  He described it as a "lightening bolt that made his body rise off the bed".  She said he did go back to sleep and felt fine after the episode.  Patient did not know the time the episode occurred.   Advised no shock alert was received last night or this morning on [facility] site.  She said he is fine now.  Requested a remote transmission be sent for review and will call her back.

## 2019-04-20 NOTE — Telephone Encounter (Signed)
Daughter called in left a voicemail for Dr. Caryl Comes on afib clinic VM - stated her name is cindy and pt states he felt he was shocked via his ICD last night. She understands the device clinic did not see anything related to this on the transmission but she would like to talk to DR. Klein's nurse regarding this.  Her number is [phone].

## 2019-04-20 NOTE — Telephone Encounter (Signed)
Returned call to [name].  She said patient and her other sister are having a difficult time believing there is not a shock on the report.  She spoke with Oda Kilts, PA regarding the report and he told her that are no events noted.  Advised patient sent 3 reports from home monitor and they were all normal.  She said the screen monitor showed a question mark with battery picture and patient thinks this may be causing a problem.  Advised that information would not affect the outcome of each report that was sent.  Suggested to call [employer] for any questions about the home monitor and provided her with tech support number.  She said she will do that.  Offered to set up an appointment with EP PA/NP but she declined at this time.

## 2019-04-20 NOTE — Telephone Encounter (Signed)
Spoke with pt's daughter and reassured her the device clinic did not see a shock on pt's manual transmission. She confirmed [employer] also did not see an episode.  I explained to her how sometimes ICD patients can experience realistic dreams (pt was sleeping when he believes he was shocked) which can be associated with PTSD. I offered for pt to be set up with a visit with Dr Caryl Comes as he is over due and it will help offer him reassurance.  She agreed and appointment was made.   Pt will call with any additional needs.

## 2019-04-20 NOTE — Telephone Encounter (Signed)
Discussed with pt daughter.  Reviewed last several transmissions. Pt has had no shocks or device alerts, pt was in bed at the time. She states something similar happened a couple of years ago where he swears he was shocked, but nothing was ever seen on the device.    She will call back with any further episodes or questions. She denies trauma or strenuous activity that would have resulted in MSK pain.    Legrand Como [address] [place], PA-C 04/20/2019 11:37 AM

## 2019-04-20 NOTE — Telephone Encounter (Signed)
  Daughter is calling to make [place] aware that [name] Comes states that his pacemaker fired off last night while he was in the bed. He told her that it almost lifted him off the bed. He seems like he feels okay this morning but wanted [place] to know. Can call the daughter back with any questions.

## 2019-04-20 NOTE — Telephone Encounter (Signed)
Please have him contact the device clinic.

## 2019-04-20 NOTE — Telephone Encounter (Signed)
Will manage patient in other telephone note from today.

## 2019-04-21 ENCOUNTER — Other Ambulatory Visit: Payer: Self-pay | Admitting: Pulmonary Disease

## 2019-04-22 ENCOUNTER — Other Ambulatory Visit: Payer: Self-pay | Admitting: Pulmonary Disease

## 2019-04-22 NOTE — Telephone Encounter (Signed)
Noted. thx 

## 2019-04-23 ENCOUNTER — Other Ambulatory Visit: Payer: Self-pay | Admitting: Family Medicine

## 2019-04-26 ENCOUNTER — Encounter: Payer: Self-pay | Admitting: Family Medicine

## 2019-04-27 MED ORDER — PRAVASTATIN SODIUM 40 MG PO TABS: 40.0000 mg | ORAL_TABLET | Freq: Every day | ORAL | 1 refills | Status: DC

## 2019-05-01 ENCOUNTER — Telehealth (INDEPENDENT_AMBULATORY_CARE_PROVIDER_SITE_OTHER): Payer: Medicare Other | Admitting: Rheumatology

## 2019-05-01 ENCOUNTER — Encounter: Payer: Self-pay | Admitting: Rheumatology

## 2019-05-01 DIAGNOSIS — Z87891 Personal history of nicotine dependence: Secondary | ICD-10-CM | POA: Diagnosis not present

## 2019-05-01 DIAGNOSIS — I739 Peripheral vascular disease, unspecified: Secondary | ICD-10-CM | POA: Diagnosis not present

## 2019-05-01 DIAGNOSIS — I272 Pulmonary hypertension, unspecified: Secondary | ICD-10-CM

## 2019-05-01 DIAGNOSIS — I255 Ischemic cardiomyopathy: Secondary | ICD-10-CM

## 2019-05-01 DIAGNOSIS — Z95810 Presence of automatic (implantable) cardiac defibrillator: Secondary | ICD-10-CM

## 2019-05-01 DIAGNOSIS — E222 Syndrome of inappropriate secretion of antidiuretic hormone: Secondary | ICD-10-CM

## 2019-05-01 DIAGNOSIS — Z8719 Personal history of other diseases of the digestive system: Secondary | ICD-10-CM

## 2019-05-01 DIAGNOSIS — C44219 Basal cell carcinoma of skin of left ear and external auricular canal: Secondary | ICD-10-CM | POA: Diagnosis not present

## 2019-05-01 DIAGNOSIS — Z8709 Personal history of other diseases of the respiratory system: Secondary | ICD-10-CM | POA: Diagnosis not present

## 2019-05-01 DIAGNOSIS — M8000XD Age-related osteoporosis with current pathological fracture, unspecified site, subsequent encounter for fracture with routine healing: Secondary | ICD-10-CM

## 2019-05-01 DIAGNOSIS — J449 Chronic obstructive pulmonary disease, unspecified: Secondary | ICD-10-CM | POA: Diagnosis not present

## 2019-05-01 DIAGNOSIS — S32030S Wedge compression fracture of third lumbar vertebra, sequela: Secondary | ICD-10-CM

## 2019-05-01 DIAGNOSIS — Z8679 Personal history of other diseases of the circulatory system: Secondary | ICD-10-CM

## 2019-05-01 DIAGNOSIS — M1A09X0 Idiopathic chronic gout, multiple sites, without tophus (tophi): Secondary | ICD-10-CM

## 2019-05-01 DIAGNOSIS — E559 Vitamin D deficiency, unspecified: Secondary | ICD-10-CM | POA: Diagnosis not present

## 2019-05-01 DIAGNOSIS — I2510 Atherosclerotic heart disease of native coronary artery without angina pectoris: Secondary | ICD-10-CM

## 2019-05-01 DIAGNOSIS — Z87820 Personal history of traumatic brain injury: Secondary | ICD-10-CM

## 2019-05-01 DIAGNOSIS — I5042 Chronic combined systolic (congestive) and diastolic (congestive) heart failure: Secondary | ICD-10-CM

## 2019-05-01 DIAGNOSIS — I10 Essential (primary) hypertension: Secondary | ICD-10-CM

## 2019-05-01 DIAGNOSIS — Z9861 Coronary angioplasty status: Secondary | ICD-10-CM

## 2019-05-01 NOTE — Progress Notes (Signed)
Virtual Visit via Telephone Note  I connected with [name] on 05/01/19 at  2:30 PM EDT by telephone and verified that I am speaking with the correct person using two identifiers.  Location: Patient: Home  Provider: Clinic    I discussed the limitations, risks, security and privacy concerns of performing an evaluation and management service by telephone and the availability of in person appointments. I also discussed with the patient that there may be a patient responsible charge related to this service. The patient expressed understanding and agreed to proceed. This service was conducted via virtual visit.  The patient was located at home. I was located in my office.  Consent was obtained prior to the virtual visit and is aware of possible charges through their insurance for this visit.  The patient is an established patient.  Dr. Estanislado Pandy, MD conducted the virtual visit and Hazel Sams, PA-C acted as scribe during the service.  Office staff helped with scheduling follow up visits after the service was conducted.    CC: Medication monitoring   History of Present Illness: Patient is a 85 year old male with a past medical history of osteoporosis and gout.  He is taking Allopurinol 100 mg 1 tablet po daily for management of gout.   He denies any recent gout flares. He is on Fosamax 70 mg po once weekly for management of osteoporosis. He has not had any recent falls or fractures.  He denies any joint pain or joint swelling.  He has pedal edema bilaterally.  He takes Lasix as prescribed.  He has no new concerns at this time.   Review of Systems  Constitutional: Negative for fever and malaise/fatigue.  Eyes: Negative for photophobia, pain, discharge and redness.  Respiratory: Negative for cough, shortness of breath and wheezing.   Cardiovascular: Positive for leg swelling. Negative for chest pain and palpitations.  Gastrointestinal: Negative for blood in stool, constipation and  diarrhea.  Genitourinary: Negative for dysuria.  Musculoskeletal: Negative for back pain, joint pain, myalgias and neck pain.  Skin: Negative for rash.  Neurological: Negative for dizziness and headaches.  Psychiatric/Behavioral: Negative for depression. The patient is not nervous/anxious and does not have insomnia.       Observations/Objective: Physical Exam  Constitutional: He is oriented to person, place, and time.  Neurological: He is alert and oriented to person, place, and time.  Psychiatric: Mood, memory, affect and judgment normal.   Patient reports morning stiffness for 0 minutes.   Patient denies nocturnal pain.  Difficulty dressing/grooming: Denies Difficulty climbing stairs: Denies Difficulty getting out of chair: Denies Difficulty using hands for taps, buttons, cutlery, and/or writing: Denies   Assessment and Plan: Visit Diagnoses: Age-related osteoporosis with current pathological fracture with routine healing, subsequent encounter - History of T12 compression fracture in 1999.  L3 lumbar vertebral compression fracture.  He is on Fosamax 70 mg p.o. weekly. DXA 2019 T -2.7 increased by 11.4%.  He is tolerating medication well.  His labs have been stable.  He will continue on Fosamax 70 mg po weekly.  He does not need any refills at this time.  He will continue taking calcium and vitamin D supplements. He will follow up in 6 months.   Vitamin D deficiency disease-He is taking a calcium and vitamin D supplement.  Idiopathic chronic gout of multiple sites without tophus-He has not had any recent gout flares.  He continues to take Allopurinol 100 mg po daily prescribed by Dr. Lenna Gilford.  We will send  in lab orders to have CBC, CMP, and uric acid.    Chronic systemic steroid treatment for COPD.  Other medical problems are listed as follows:  History of COPD  Former smoker  Automatic implantable cardioverter-defibrillator in situ  History of diverticulosis  COPD  GOLD III  Peripheral vascular disease ([place])  SIADH (syndrome of inappropriate ADH production) (HCC)  Basal cell carcinoma (BCC) of auricle of left ear  Closed compression fracture of L3 lumbar vertebra, sequela  CAD S/P percutaneous coronary angioplasty  Chronic combined systolic and diastolic CHF (congestive heart failure) (HCC)  Cardiomyopathy, ischemic  Pulmonary hypertension (HCC)  Essential hypertension  History of subdural hematoma  History of traumatic brain injury  Acute on chronic combined systolic and diastolic congestive heart failure ([place])   Follow Up Instructions: He will follow up in 6 months.    I discussed the assessment and treatment plan with the patient. The patient was provided an opportunity to ask questions and all were answered. The patient agreed with the plan and demonstrated an understanding of the instructions.   The patient was advised to call back or seek an in-person evaluation if the symptoms worsen or if the condition fails to improve as anticipated.  I provided 15 minutes of non-face-to-face time during this encounter.   Ofilia Neas, PA-C   Bo Merino, MD

## 2019-05-04 ENCOUNTER — Ambulatory Visit (INDEPENDENT_AMBULATORY_CARE_PROVIDER_SITE_OTHER): Payer: Medicare Other

## 2019-05-04 DIAGNOSIS — I5042 Chronic combined systolic (congestive) and diastolic (congestive) heart failure: Secondary | ICD-10-CM | POA: Diagnosis not present

## 2019-05-04 DIAGNOSIS — Z95810 Presence of automatic (implantable) cardiac defibrillator: Secondary | ICD-10-CM | POA: Diagnosis not present

## 2019-05-05 ENCOUNTER — Ambulatory Visit (INDEPENDENT_AMBULATORY_CARE_PROVIDER_SITE_OTHER): Payer: Medicare Other | Admitting: *Deleted

## 2019-05-05 DIAGNOSIS — I255 Ischemic cardiomyopathy: Secondary | ICD-10-CM

## 2019-05-05 DIAGNOSIS — R55 Syncope and collapse: Secondary | ICD-10-CM | POA: Diagnosis not present

## 2019-05-05 NOTE — Progress Notes (Signed)
EPIC Encounter for ICM Monitoring  Patient Name: [name] is a 85 y.o. male Date: 05/05/2019 Primary Care Physican: Caren Macadam, MD Primary Cardiologist: Hochrein Electrophysiologist: Faustino Congress Weight:unknown  Call todaughter [name].   She reports patient is feeling fine and if he has any fluid symptoms she will call.  Optivol thoracic impedance trending close to baseline normal.    Prescribed: Furosemide20 mg 1 tabletdaily.Potassium 10 mEq 1 tabletdaily.   Labs: 10/08/2018 Creatinine 1.17, BUN 26, Potassium 4.5, Sodium 138 08/21/2018 Creatinine 1.19, BUN 17, Potassium 5.2, Sodium 139, GFR 55-64 07/24/2018 Creatinine1.03, BUN17, Potassium3.5, Sodium133, GFR>60 A complete set of results can be found in Results Review.  Recommendations: No changes and encouraged to call if experiencing any fluid symptoms.  Follow-up plan: ICM clinic phone appointment on8/31/2020.Office visit with Dr Caryl Comes 05/20/2019.  Copy of ICM check sent to [place].   3 month ICM trend: 05/04/2019    1 Year ICM trend:       [name], RN 05/05/2019 5:14 PM

## 2019-05-06 LAB — CUP PACEART REMOTE DEVICE CHECK
Battery Remaining Longevity: 76 mo
Battery Voltage: 2.98 V
Brady Statistic AP VP Percent: 0.08 %
Brady Statistic AP VS Percent: 99.9 %
Brady Statistic AS VP Percent: 0 %
Brady Statistic AS VS Percent: 0.02 %
Brady Statistic RA Percent Paced: 99.99 %
Brady Statistic RV Percent Paced: 0.08 %
Date Time Interrogation Session: 20200715004346
HighPow Impedance: 41 Ohm
HighPow Impedance: 57 Ohm
Implantable Lead Implant Date: 20090623
Implantable Lead Implant Date: 20090623
Implantable Lead Location: 753859
Implantable Lead Location: 753860
Implantable Lead Model: 5076
Implantable Lead Model: 6947
Implantable Pulse Generator Implant Date: 20170616
Lead Channel Impedance Value: 285 Ohm
Lead Channel Impedance Value: 342 Ohm
Lead Channel Impedance Value: 399 Ohm
Lead Channel Pacing Threshold Amplitude: 0.875 V
Lead Channel Pacing Threshold Amplitude: 1 V
Lead Channel Pacing Threshold Pulse Width: 0.4 ms
Lead Channel Pacing Threshold Pulse Width: 0.4 ms
Lead Channel Sensing Intrinsic Amplitude: 0.75 mV
Lead Channel Sensing Intrinsic Amplitude: 0.75 mV
Lead Channel Sensing Intrinsic Amplitude: 7.625 mV
Lead Channel Sensing Intrinsic Amplitude: 7.625 mV
Lead Channel Setting Pacing Amplitude: 2 V
Lead Channel Setting Pacing Amplitude: 2.5 V
Lead Channel Setting Pacing Pulse Width: 0.4 ms
Lead Channel Setting Sensing Sensitivity: 0.6 mV

## 2019-05-17 ENCOUNTER — Other Ambulatory Visit: Payer: Self-pay | Admitting: Rheumatology

## 2019-05-18 NOTE — Telephone Encounter (Addendum)
Last Visit: 05/01/19 Next Visit: 11/03/19 Labs: 10/08/18 BUN 26 AST 41 Hgb 12.7 Hct 38.6 Platelets 138   Patient advised he is due to update labs. Patient will update with his cardiologist on 05/20/19.  Okay to refill 30 day supply Fosamax?

## 2019-05-18 NOTE — Telephone Encounter (Signed)
Ok to refill fosamax

## 2019-05-19 ENCOUNTER — Telehealth: Payer: Self-pay | Admitting: *Deleted

## 2019-05-19 NOTE — Telephone Encounter (Signed)
.      COVID-19 Pre-Screening Questions:  . In the past 7 to 10 days have you had a cough,  shortness of breath, headache, congestion, fever (100 or greater) body aches, chills, sore throat, or sudden loss of taste or sense of smell?NO . Have you been around anyone with known Covid 19?NO . Have you been around anyone who is awaiting Covid 19 test results in the past 7 to 10 days?NO . Have you been around anyone who has been exposed to Covid 19, or has mentioned symptoms of Covid 19 within the past 7 to 10 days?NO  Spoke with patient and he answered "no" to all questions. I made him aware of our visitor and mask policy and not to arrive any sooner that 15 minutes prior to his appointment time. He verbalized his understanding and agrees with this plan.  If you have any concerns/questions about symptoms patients report during screening (either on the phone or at [facility]). Contact the provider seeing the patient or DOD for further guidance.  If neither are available contact a member of the leadership team.

## 2019-05-20 ENCOUNTER — Ambulatory Visit (INDEPENDENT_AMBULATORY_CARE_PROVIDER_SITE_OTHER): Payer: Medicare Other | Admitting: Internal Medicine

## 2019-05-20 ENCOUNTER — Encounter: Payer: Self-pay | Admitting: Internal Medicine

## 2019-05-20 ENCOUNTER — Other Ambulatory Visit: Payer: Self-pay

## 2019-05-20 VITALS — BP 120/72 | HR 61 | Ht 71.0 in | Wt 185.4 lb

## 2019-05-20 DIAGNOSIS — I5042 Chronic combined systolic (congestive) and diastolic (congestive) heart failure: Secondary | ICD-10-CM

## 2019-05-20 DIAGNOSIS — M109 Gout, unspecified: Secondary | ICD-10-CM | POA: Diagnosis not present

## 2019-05-20 DIAGNOSIS — I472 Ventricular tachycardia, unspecified: Secondary | ICD-10-CM

## 2019-05-20 DIAGNOSIS — I255 Ischemic cardiomyopathy: Secondary | ICD-10-CM

## 2019-05-20 DIAGNOSIS — R7989 Other specified abnormal findings of blood chemistry: Secondary | ICD-10-CM

## 2019-05-20 DIAGNOSIS — Z95810 Presence of automatic (implantable) cardiac defibrillator: Secondary | ICD-10-CM

## 2019-05-20 DIAGNOSIS — E559 Vitamin D deficiency, unspecified: Secondary | ICD-10-CM | POA: Diagnosis not present

## 2019-05-20 LAB — CUP PACEART INCLINIC DEVICE CHECK
Battery Remaining Longevity: 75 mo
Battery Voltage: 2.98 V
Brady Statistic AP VP Percent: 0.08 %
Brady Statistic AP VS Percent: 98.45 %
Brady Statistic AS VP Percent: 0.01 %
Brady Statistic AS VS Percent: 1.46 %
Brady Statistic RA Percent Paced: 98.53 %
Brady Statistic RV Percent Paced: 0.08 %
Date Time Interrogation Session: 20200729163431
HighPow Impedance: 42 Ohm
HighPow Impedance: 59 Ohm
Implantable Lead Implant Date: 20090623
Implantable Lead Implant Date: 20090623
Implantable Lead Location: 753859
Implantable Lead Location: 753860
Implantable Lead Model: 5076
Implantable Lead Model: 6947
Implantable Pulse Generator Implant Date: 20170616
Lead Channel Impedance Value: 304 Ohm
Lead Channel Impedance Value: 361 Ohm
Lead Channel Impedance Value: 399 Ohm
Lead Channel Pacing Threshold Amplitude: 1 V
Lead Channel Pacing Threshold Amplitude: 2 V
Lead Channel Pacing Threshold Pulse Width: 0.4 ms
Lead Channel Pacing Threshold Pulse Width: 0.4 ms
Lead Channel Sensing Intrinsic Amplitude: 6.75 mV
Lead Channel Setting Pacing Amplitude: 2 V
Lead Channel Setting Pacing Amplitude: 2.5 V
Lead Channel Setting Pacing Pulse Width: 0.4 ms
Lead Channel Setting Sensing Sensitivity: 0.6 mV

## 2019-05-20 MED ORDER — AMIODARONE HCL 200 MG PO TABS: ORAL_TABLET | ORAL | 6 refills | Status: DC

## 2019-05-20 NOTE — Patient Instructions (Signed)
Medication Instructions:  Your physician has recommended you make the following change in your medication:   1. Begin taking your Midodrine at 7AM, 11AM, and 3PM 2. Take your Amiodarone only 5 days per week.  Labwork: You will have labs drawn today: CMP, CBC with Diff, Uric Acid, Vit D, and TSH  Testing/Procedures: None ordered.  Follow-Up: Your physician recommends that you schedule a follow-up appointment in:   12 months with Dr. Caryl Comes  Any Other Special Instructions Will Be Listed Below (If Applicable).     If you need a refill on your cardiac medications before your next appointment, please call your pharmacy.
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
## 2019-05-20 NOTE — Progress Notes (Signed)
Remote ICD transmission.   

## 2019-05-21 LAB — CBC WITH DIFFERENTIAL/PLATELET
Basophils Absolute: 0.1 10*3/uL (ref 0.0–0.2)
Basos: 1 %
EOS (ABSOLUTE): 3.2 10*3/uL — ABNORMAL HIGH (ref 0.0–0.4)
Eos: 32 %
Hematocrit: 37.4 % — ABNORMAL LOW (ref 37.5–51.0)
Hemoglobin: 12.1 g/dL — ABNORMAL LOW (ref 13.0–17.7)
Immature Grans (Abs): 0 10*3/uL (ref 0.0–0.1)
Immature Granulocytes: 0 %
Lymphocytes Absolute: 1.8 10*3/uL (ref 0.7–3.1)
Lymphs: 18 %
MCH: 29.2 pg (ref 26.6–33.0)
MCHC: 32.4 g/dL (ref 31.5–35.7)
MCV: 90 fL (ref 79–97)
Monocytes Absolute: 0.7 10*3/uL (ref 0.1–0.9)
Monocytes: 7 %
Neutrophils Absolute: 4.2 10*3/uL (ref 1.4–7.0)
Neutrophils: 42 %
Platelets: 155 10*3/uL (ref 150–450)
RBC: 4.14 x10E6/uL (ref 4.14–5.80)
RDW: 15.3 % (ref 11.6–15.4)
WBC: 9.9 10*3/uL (ref 3.4–10.8)

## 2019-05-21 LAB — COMPREHENSIVE METABOLIC PANEL
ALT: 20 IU/L (ref 0–44)
AST: 38 IU/L (ref 0–40)
Albumin/Globulin Ratio: 1.2 (ref 1.2–2.2)
Albumin: 3.9 g/dL (ref 3.6–4.6)
Alkaline Phosphatase: 101 IU/L (ref 39–117)
BUN/Creatinine Ratio: 16 (ref 10–24)
BUN: 21 mg/dL (ref 8–27)
Bilirubin Total: 0.3 mg/dL (ref 0.0–1.2)
CO2: 24 mmol/L (ref 20–29)
Calcium: 9.2 mg/dL (ref 8.6–10.2)
Chloride: 98 mmol/L (ref 96–106)
Creatinine, Ser: 1.28 mg/dL — ABNORMAL HIGH (ref 0.76–1.27)
GFR calc Af Amer: 58 mL/min/{1.73_m2} — ABNORMAL LOW (ref >59)
GFR calc non Af Amer: 50 mL/min/{1.73_m2} — ABNORMAL LOW (ref >59)
Globulin, Total: 3.2 g/dL (ref 1.5–4.5)
Glucose: 91 mg/dL (ref 65–99)
Potassium: 4.9 mmol/L (ref 3.5–5.2)
Sodium: 136 mmol/L (ref 134–144)
Total Protein: 7.1 g/dL (ref 6.0–8.5)

## 2019-05-21 LAB — URIC ACID: Uric Acid: 5.3 mg/dL (ref 3.7–8.6)

## 2019-05-21 LAB — VITAMIN D 25 HYDROXY (VIT D DEFICIENCY, FRACTURES): Vit D, 25-Hydroxy: 37.2 ng/mL (ref 30.0–100.0)

## 2019-05-21 LAB — TSH: TSH: 9.56 u[IU]/mL — ABNORMAL HIGH (ref 0.450–4.500)

## 2019-05-21 NOTE — Progress Notes (Signed)
Hgb and Hct are low but stable.  Creatinine is elevated and trending up-1.28. GFR is 50. Please notify patient and advise him to avoid taking NSAIDs.   Please forward labs to PCP.  Uric acid is within desirable range.  He should continue taking allopurinol as prescribed.  Vitamin D WNL.  Please recommend a maintenance dose of vitamin D.

## 2019-05-24 ENCOUNTER — Other Ambulatory Visit: Payer: Self-pay | Admitting: Cardiology

## 2019-05-25 ENCOUNTER — Other Ambulatory Visit: Payer: Self-pay | Admitting: Family Medicine

## 2019-05-25 MED ORDER — LEVOTHYROXINE SODIUM 88 MCG PO TABS: 88.0000 ug | ORAL_TABLET | Freq: Every day | ORAL | 1 refills | Status: DC

## 2019-05-26 ENCOUNTER — Telehealth: Payer: Self-pay

## 2019-05-26 NOTE — Addendum Note (Signed)
Addended by: Lahoma Crocker A on: 05/26/2019 04:00 PM   Modules accepted: Orders

## 2019-05-26 NOTE — Telephone Encounter (Signed)
Attempted ICM follow up call to [name] following patients's 05/20/2019 office visit.  Left message to return call.

## 2019-05-29 NOTE — Telephone Encounter (Signed)
Scheduled follow up remote after visit with Dr Caryl Comes on 06/08/2019.

## 2019-06-08 ENCOUNTER — Ambulatory Visit (INDEPENDENT_AMBULATORY_CARE_PROVIDER_SITE_OTHER): Payer: Medicare Other

## 2019-06-08 ENCOUNTER — Other Ambulatory Visit: Payer: Medicare Other

## 2019-06-08 ENCOUNTER — Telehealth: Payer: Self-pay | Admitting: Rheumatology

## 2019-06-08 DIAGNOSIS — Z95810 Presence of automatic (implantable) cardiac defibrillator: Secondary | ICD-10-CM

## 2019-06-08 DIAGNOSIS — I5042 Chronic combined systolic (congestive) and diastolic (congestive) heart failure: Secondary | ICD-10-CM

## 2019-06-08 NOTE — Telephone Encounter (Signed)
Patient's daughter [name] left a voicemail checking if Dr. Estanislado Pandy received the results from his labwork.

## 2019-06-08 NOTE — Telephone Encounter (Signed)
Spoke with [name] and advised her we have received his lab results. She states she will view them on my chart.

## 2019-06-09 NOTE — Progress Notes (Signed)
EPIC Encounter for ICM Monitoring  Patient Name: [name] is a 85 y.o. male Date: 06/09/2019 Primary Care Physican: Caren Macadam, MD Primary Cardiologist: [place] Electrophysiologist: Faustino Congress Weight:unknown  Transmission reviewed.  Optivol thoracic impedance normal.  Prescribed: Furosemide20 mg 1 tabletdaily.Potassium 10 mEq 1 tabletdaily.   Labs: 10/08/2018 Creatinine 1.17, BUN 26, Potassium 4.5, Sodium 138 08/21/2018 Creatinine 1.19, BUN 17, Potassium 5.2, Sodium 139, GFR 55-64 07/24/2018 Creatinine1.03, BUN17, Potassium3.5, Sodium133, GFR>60 A complete set of results can be found in Results Review.  Recommendations: None  Follow-up plan: ICM clinic phone appointment on8/31/2020.  Copy of ICM check sent to [place].    3 month ICM trend: 06/08/2019    1 Year ICM trend:       [name], RN 06/09/2019 5:03 PM

## 2019-06-21 ENCOUNTER — Other Ambulatory Visit: Payer: Self-pay | Admitting: Rheumatology

## 2019-06-22 ENCOUNTER — Ambulatory Visit (INDEPENDENT_AMBULATORY_CARE_PROVIDER_SITE_OTHER): Payer: Medicare Other

## 2019-06-22 DIAGNOSIS — I5042 Chronic combined systolic (congestive) and diastolic (congestive) heart failure: Secondary | ICD-10-CM

## 2019-06-22 DIAGNOSIS — Z95810 Presence of automatic (implantable) cardiac defibrillator: Secondary | ICD-10-CM
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
## 2019-06-22 NOTE — Telephone Encounter (Signed)
Last Visit: 05/01/19 Next Visit: 11/03/19 Labs: 05/20/19 Hgb and Hct are low but stable.  Creatinine is elevated and trending up-1.28. GFR is 50  Okay to refill per Dr. Estanislado Pandy

## 2019-06-24 NOTE — Progress Notes (Signed)
Received: Yesterday Message Contents  Mechele Dawley, RN  Kimiya Brunelle Panda, RN        You are correct! No episodes or alerts.   Thanks,  Raquel Sarna

## 2019-06-24 NOTE — Progress Notes (Signed)
Spoke with [name].  Advised device clinic reviewed report and it is normal.  She appreciated checking and will let patient know that everything was normal.

## 2019-07-05 ENCOUNTER — Other Ambulatory Visit: Payer: Self-pay | Admitting: Cardiology

## 2019-07-19 ENCOUNTER — Other Ambulatory Visit: Payer: Self-pay | Admitting: Cardiology

## 2019-07-19 ENCOUNTER — Other Ambulatory Visit: Payer: Self-pay | Admitting: Family Medicine

## 2019-07-28 ENCOUNTER — Other Ambulatory Visit: Payer: Self-pay

## 2019-07-28 ENCOUNTER — Telehealth: Payer: Self-pay | Admitting: *Deleted

## 2019-07-28 ENCOUNTER — Other Ambulatory Visit (INDEPENDENT_AMBULATORY_CARE_PROVIDER_SITE_OTHER): Payer: Medicare Other

## 2019-07-28 DIAGNOSIS — E039 Hypothyroidism, unspecified: Secondary | ICD-10-CM | POA: Diagnosis not present

## 2019-07-28 DIAGNOSIS — R7989 Other specified abnormal findings of blood chemistry: Secondary | ICD-10-CM | POA: Diagnosis not present

## 2019-07-28 LAB — TSH: TSH: 6.93 u[IU]/mL — ABNORMAL HIGH (ref 0.35–4.50)

## 2019-07-28 NOTE — Telephone Encounter (Signed)
I left a message for [name] to return my call.

## 2019-07-28 NOTE — Telephone Encounter (Signed)
Copied from [place] [id number]. Topic: General - Other >> Jul 27, 2019  5:51 PM Yvette Rack wrote: Reason for CRM: Pt daughter [name] requests call back at [phone]

## 2019-07-31 ENCOUNTER — Ambulatory Visit (INDEPENDENT_AMBULATORY_CARE_PROVIDER_SITE_OTHER): Payer: Medicare Other

## 2019-07-31 ENCOUNTER — Other Ambulatory Visit: Payer: Self-pay

## 2019-07-31 DIAGNOSIS — Z23 Encounter for immunization: Secondary | ICD-10-CM | POA: Diagnosis not present

## 2019-08-02 ENCOUNTER — Other Ambulatory Visit: Payer: Self-pay | Admitting: Cardiology

## 2019-08-04 ENCOUNTER — Telehealth: Payer: Self-pay | Admitting: *Deleted

## 2019-08-04 MED ORDER — LEVOTHYROXINE SODIUM 100 MCG PO TABS: 100.0000 ug | ORAL_TABLET | Freq: Every day | ORAL | 1 refills | Status: DC

## 2019-08-04 NOTE — Telephone Encounter (Signed)
Copied from [place] [id number]. Topic: General - Other >> Aug 03, 2019  4:38 PM Rainey Pines A wrote: Patients daughter called to inform Dr. Inocente Salles that patient I taking 88 mg of levothyroxine (SYNTHROID) 88 MCG tablet

## 2019-08-04 NOTE — Telephone Encounter (Signed)
See results note. 

## 2019-08-05 ENCOUNTER — Ambulatory Visit (INDEPENDENT_AMBULATORY_CARE_PROVIDER_SITE_OTHER): Payer: Medicare Other | Admitting: *Deleted

## 2019-08-05 DIAGNOSIS — R55 Syncope and collapse: Secondary | ICD-10-CM

## 2019-08-05 DIAGNOSIS — I472 Ventricular tachycardia, unspecified: Secondary | ICD-10-CM

## 2019-08-05 LAB — CUP PACEART REMOTE DEVICE CHECK
Battery Remaining Longevity: 69 mo
Battery Voltage: 2.98 V
Brady Statistic AP VP Percent: 0.11 %
Brady Statistic AP VS Percent: 99.76 %
Brady Statistic AS VP Percent: 0 %
Brady Statistic AS VS Percent: 0.14 %
Brady Statistic RA Percent Paced: 99.86 %
Brady Statistic RV Percent Paced: 0.11 %
Date Time Interrogation Session: 20201014062823
HighPow Impedance: 44 Ohm
HighPow Impedance: 58 Ohm
Implantable Lead Implant Date: 20090623
Implantable Lead Implant Date: 20090623
Implantable Lead Location: 753859
Implantable Lead Location: 753860
Implantable Lead Model: 5076
Implantable Lead Model: 6947
Implantable Pulse Generator Implant Date: 20170616
Lead Channel Impedance Value: 285 Ohm
Lead Channel Impedance Value: 342 Ohm
Lead Channel Impedance Value: 399 Ohm
Lead Channel Pacing Threshold Amplitude: 0.875 V
Lead Channel Pacing Threshold Amplitude: 0.875 V
Lead Channel Pacing Threshold Pulse Width: 0.4 ms
Lead Channel Pacing Threshold Pulse Width: 0.4 ms
Lead Channel Sensing Intrinsic Amplitude: 0.75 mV
Lead Channel Sensing Intrinsic Amplitude: 0.75 mV
Lead Channel Sensing Intrinsic Amplitude: 7.375 mV
Lead Channel Sensing Intrinsic Amplitude: 7.375 mV
Lead Channel Setting Pacing Amplitude: 2 V
Lead Channel Setting Pacing Amplitude: 2.5 V
Lead Channel Setting Pacing Pulse Width: 0.4 ms
Lead Channel Setting Sensing Sensitivity: 0.6 mV

## 2019-08-07 ENCOUNTER — Ambulatory Visit (INDEPENDENT_AMBULATORY_CARE_PROVIDER_SITE_OTHER): Payer: Medicare Other

## 2019-08-07 DIAGNOSIS — I5042 Chronic combined systolic (congestive) and diastolic (congestive) heart failure: Secondary | ICD-10-CM | POA: Diagnosis not present

## 2019-08-07 DIAGNOSIS — Z95810 Presence of automatic (implantable) cardiac defibrillator: Secondary | ICD-10-CM | POA: Diagnosis not present

## 2019-08-10 ENCOUNTER — Telehealth: Payer: Self-pay

## 2019-08-10 NOTE — Progress Notes (Signed)
EPIC Encounter for ICM Monitoring  Patient Name: [name] is a 85 y.o. male Date: 08/10/2019 Primary Care Physican: Caren Macadam, MD Primary Cardiologist: [place] Electrophysiologist: Faustino Congress Weight:unknown  Attempted call to daughter, [name] and unable to reach.  Left detailed message per DPR regarding transmission. Transmission reviewed.   Optivol thoracic impedancenormal.  Prescribed: Furosemide20 mg 1 tabletdaily.Potassium 10 mEq 1 tabletdaily.   Labs: 05/20/2019 Creatinine 1.28, BUN 21, Potassium 4.9, Sodium 136, GFR 50-58 A complete set of results can be found in Results Review.  Recommendations: Left voice mail with ICM number and encouraged to call if experiencing any fluid symptoms.  Follow-up plan: ICM clinic phone appointment on 11/17/2020to recheck fluid levels.   91 day device clinic remote transmission 11/04/2019.   Copy of ICM check sent to Dr. Caryl Comes.   3 month ICM trend: 08/05/2019    1 Year ICM trend:       Jesse Billings, RN 08/10/2019 11:12 AM

## 2019-08-10 NOTE — Telephone Encounter (Signed)
Remote ICM transmission received.  Attempted call to daughter, [name] regarding ICM remote transmission and left detailed message per DPR.  Advised to return call for any fluid symptoms or questions. Next ICM remote transmission scheduled 09/08/2019.

## 2019-08-13 ENCOUNTER — Ambulatory Visit: Payer: Medicare Other

## 2019-08-16 ENCOUNTER — Other Ambulatory Visit: Payer: Self-pay | Admitting: Cardiology

## 2019-08-16 ENCOUNTER — Other Ambulatory Visit: Payer: Self-pay | Admitting: Family Medicine

## 2019-08-17 NOTE — Progress Notes (Signed)
Remote ICD transmission.   

## 2019-08-27 ENCOUNTER — Ambulatory Visit: Payer: Medicare Other

## 2019-08-30 ENCOUNTER — Other Ambulatory Visit: Payer: Self-pay | Admitting: Cardiology

## 2019-08-31 NOTE — Telephone Encounter (Signed)
Rx has been sent to the pharmacy electronically. ° °

## 2019-09-06 ENCOUNTER — Other Ambulatory Visit: Payer: Self-pay | Admitting: Family Medicine

## 2019-09-06 ENCOUNTER — Other Ambulatory Visit: Payer: Self-pay | Admitting: Cardiology

## 2019-09-08 ENCOUNTER — Other Ambulatory Visit: Payer: Self-pay | Admitting: Cardiology

## 2019-09-08 ENCOUNTER — Ambulatory Visit (INDEPENDENT_AMBULATORY_CARE_PROVIDER_SITE_OTHER): Payer: Medicare Other

## 2019-09-08 DIAGNOSIS — I5042 Chronic combined systolic (congestive) and diastolic (congestive) heart failure: Secondary | ICD-10-CM | POA: Diagnosis not present

## 2019-09-08 DIAGNOSIS — Z95810 Presence of automatic (implantable) cardiac defibrillator: Secondary | ICD-10-CM | POA: Diagnosis not present

## 2019-09-10 DIAGNOSIS — M79674 Pain in right toe(s): Secondary | ICD-10-CM | POA: Diagnosis not present

## 2019-09-10 DIAGNOSIS — I739 Peripheral vascular disease, unspecified: Secondary | ICD-10-CM | POA: Diagnosis not present

## 2019-09-10 DIAGNOSIS — B351 Tinea unguium: Secondary | ICD-10-CM | POA: Diagnosis not present

## 2019-09-10 DIAGNOSIS — L602 Onychogryphosis: Secondary | ICD-10-CM | POA: Diagnosis not present

## 2019-09-10 DIAGNOSIS — L03031 Cellulitis of right toe: Secondary | ICD-10-CM | POA: Diagnosis not present

## 2019-09-11 NOTE — Progress Notes (Signed)
EPIC Encounter for ICM Monitoring  Patient Name: [name] is a 85 y.o. male Date: 09/11/2019 Primary Care Physican: Caren Macadam, MD Primary Cardiologist: [place] Electrophysiologist: Faustino Congress Weight:unknown  Transmission reviewed.   Optivol thoracic impedancenormal.  Prescribed: Furosemide20 mg 1 tabletdaily.Potassium 10 mEq 1 tabletdaily.   Labs: 05/20/2019 Creatinine 1.28, BUN 21, Potassium 4.9, Sodium 136, GFR 50-58 A complete set of results can be found in Results Review.  Recommendations: None  Follow-up plan: ICM clinic phone appointment on 10/19/2019.   91 day device clinic remote transmission 11/04/2019.    Copy of ICM check sent to Dr. Caryl Comes.   3 month ICM trend: 09/08/2019    1 Year ICM trend:       [name], RN 09/11/2019 10:37 AM

## 2019-09-14 ENCOUNTER — Telehealth: Payer: Self-pay | Admitting: Pulmonary Disease

## 2019-09-14 NOTE — Telephone Encounter (Signed)
Called the patient and advised him of the recommendations below. He confirmed he has not been using any nasal sprays.   He voiced understanding regarding the directions given. Will call back if this happens again. Nothing further needed at this time.

## 2019-09-14 NOTE — Telephone Encounter (Signed)
Message below routed to app of the day, Rexene Edison, NP.  Tammy, this patient was last seen in clinic by Dr. Valeta Harms 12/10/18. The oxygen he uses was ordered 04/02/19.  I called and spoke to the patient. He stated this was the first time a nose bleed has happened. He only uses 1L of O2 during the night. Stated his nose bleed for 30 minutes straight and did not stop until he applied pressure for 10 minutes.  The patient denied any increased shortness of breath or wheezing, no fever.  Patient stated he only gets short of breath when very active or walks long distance.  His daughter confirmed that there were "chunks" that she presumes were mucus, but not sure if it was something else.  Are there any recommendations for him, or should he be made a video/televisit?

## 2019-09-14 NOTE — Telephone Encounter (Signed)
Sorry to hear this   For nose bleeds would use saline gel to nasal passages several times daily to provide protective coating .  Try to avoid blowing nose.  No straining, lifting heavy items or prolonged bending over.  If not improving will need ASAP referral to ENT   I do not see blood thinner on MAR but double check to make sure not taking and not using Afrin or other nose sprays that might be irritating .   Please contact office for sooner follow up if symptoms do not improve or worsen or seek emergency care

## 2019-09-16 ENCOUNTER — Other Ambulatory Visit: Payer: Self-pay

## 2019-09-20 ENCOUNTER — Other Ambulatory Visit: Payer: Self-pay | Admitting: Cardiology

## 2019-09-27 ENCOUNTER — Other Ambulatory Visit: Payer: Self-pay | Admitting: Rheumatology

## 2019-09-28 NOTE — Telephone Encounter (Signed)
Ok to refill 

## 2019-09-28 NOTE — Telephone Encounter (Signed)
Last Visit: 05/01/2019 telemedicine  Next Visit: 11/03/2019 Labs: 05/20/2019 Hgb and Hct are low but stable.  Creatinine is elevated and trending up-1.28. GFR is 50.Uric acid is within desirable range. He should continue taking allopurinol as prescribed. Vitamin D WNL   Okay to refill fosamax?

## 2019-09-30 DIAGNOSIS — I2510 Atherosclerotic heart disease of native coronary artery without angina pectoris: Secondary | ICD-10-CM | POA: Insufficient documentation

## 2019-09-30 DIAGNOSIS — E785 Hyperlipidemia, unspecified: Secondary | ICD-10-CM | POA: Insufficient documentation
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
## 2019-10-01 ENCOUNTER — Encounter: Payer: Self-pay | Admitting: Cardiology

## 2019-10-01 ENCOUNTER — Ambulatory Visit (INDEPENDENT_AMBULATORY_CARE_PROVIDER_SITE_OTHER): Payer: Medicare Other | Admitting: Cardiology

## 2019-10-01 ENCOUNTER — Other Ambulatory Visit: Payer: Self-pay

## 2019-10-01 VITALS — BP 139/84 | HR 81 | Temp 96.8°F | Ht 71.0 in | Wt 184.8 lb

## 2019-10-01 DIAGNOSIS — I472 Ventricular tachycardia, unspecified: Secondary | ICD-10-CM

## 2019-10-01 DIAGNOSIS — I10 Essential (primary) hypertension: Secondary | ICD-10-CM

## 2019-10-01 DIAGNOSIS — Z87891 Personal history of nicotine dependence: Secondary | ICD-10-CM

## 2019-10-01 DIAGNOSIS — I5042 Chronic combined systolic (congestive) and diastolic (congestive) heart failure: Secondary | ICD-10-CM | POA: Diagnosis not present

## 2019-10-01 DIAGNOSIS — E039 Hypothyroidism, unspecified: Secondary | ICD-10-CM | POA: Diagnosis not present

## 2019-10-01 DIAGNOSIS — Z86718 Personal history of other venous thrombosis and embolism: Secondary | ICD-10-CM

## 2019-10-01 DIAGNOSIS — I2510 Atherosclerotic heart disease of native coronary artery without angina pectoris: Secondary | ICD-10-CM | POA: Diagnosis not present

## 2019-10-01 DIAGNOSIS — E785 Hyperlipidemia, unspecified: Secondary | ICD-10-CM

## 2019-10-01 DIAGNOSIS — I110 Hypertensive heart disease with heart failure: Secondary | ICD-10-CM

## 2019-10-01 DIAGNOSIS — Z7982 Long term (current) use of aspirin: Secondary | ICD-10-CM

## 2019-10-01 DIAGNOSIS — Z7901 Long term (current) use of anticoagulants: Secondary | ICD-10-CM

## 2019-10-01 DIAGNOSIS — Z7189 Other specified counseling: Secondary | ICD-10-CM

## 2019-10-01 NOTE — Patient Instructions (Signed)
Medication Instructions:  Your physician recommends that you continue on your current medications as directed. Please refer to the Current Medication list given to you today.none *If you need a refill on your cardiac medications before your next appointment, please call your pharmacy*  Lab Work: none If you have labs (blood work) drawn today and your tests are completely normal, you will receive your results only by: Marland Kitchen MyChart Message (if you have MyChart) OR . A paper copy in the mail If you have any lab test that is abnormal or we need to change your treatment, we will call you to review the results.  Testing/Procedures: none  Follow-Up: At [facility], you and your health needs are our priority.  As part of our continuing mission to provide you with exceptional heart care, we have created designated Provider Care Teams.  These Care Teams include your primary Cardiologist (physician) and Advanced Practice Providers (APPs -  Physician Assistants and Nurse Practitioners) who all work together to provide you with the care you need, when you need it.  Your next appointment:   6 month(s)  The format for your next appointment:   Either In Person or Virtual  Provider:   Minus Breeding, MD

## 2019-10-04 ENCOUNTER — Other Ambulatory Visit: Payer: Self-pay | Admitting: Family Medicine

## 2019-10-11 ENCOUNTER — Other Ambulatory Visit: Payer: Self-pay | Admitting: Family Medicine

## 2019-10-13 ENCOUNTER — Encounter: Payer: Self-pay | Admitting: Family Medicine

## 2019-10-19 ENCOUNTER — Ambulatory Visit (INDEPENDENT_AMBULATORY_CARE_PROVIDER_SITE_OTHER): Payer: Medicare Other

## 2019-10-19 DIAGNOSIS — I5042 Chronic combined systolic (congestive) and diastolic (congestive) heart failure: Secondary | ICD-10-CM | POA: Diagnosis not present

## 2019-10-19 DIAGNOSIS — Z95810 Presence of automatic (implantable) cardiac defibrillator: Secondary | ICD-10-CM

## 2019-10-19 NOTE — Progress Notes (Signed)
EPIC Encounter for ICM Monitoring  Patient Name: [name] is a 85 y.o. male Date: 10/19/2019 Primary Care Physican: Caren Macadam, MD Primary Cardiologist: Hochrein Electrophysiologist: Faustino Congress Weight:unknown  Spoke with daughter, [name] [facility]).  Patient is doing well and no fluid symptoms.   Optivol thoracic impedancenormal.  Prescribed: Furosemide20 mg 1 tabletdaily.Potassium 10 mEq 1 tabletdaily.   Labs: 05/20/2019 Creatinine 1.28, BUN 21, Potassium 4.9, Sodium 136, GFR 50-58 A complete set of results can be found in Results Review.  Recommendations: None  Follow-up plan: ICM clinic phone appointment on 11/23/2019.   91 day device clinic remote transmission 11/04/2019.    Copy of ICM check sent to Dr. Caryl Comes.   3 month ICM trend: 10/19/2019    1 Year ICM trend:       Jesse Billings, RN 10/19/2019 2:37 PM

## 2019-10-25 ENCOUNTER — Other Ambulatory Visit: Payer: Self-pay | Admitting: Cardiology

## 2019-10-29 ENCOUNTER — Encounter: Payer: Self-pay | Admitting: Family Medicine

## 2019-11-01 ENCOUNTER — Other Ambulatory Visit: Payer: Self-pay | Admitting: Family Medicine

## 2019-11-01 ENCOUNTER — Other Ambulatory Visit: Payer: Self-pay | Admitting: Internal Medicine

## 2019-11-02 NOTE — Progress Notes (Signed)
Virtual Visit via Telephone Note  I connected with [name] on 11/03/19 at 10:00 AM EST by telephone and verified that I am speaking with the correct person using two identifiers.  Location: Patient: Home Provider: Clinic  This service was conducted via virtual visit. The patient was located at home. I was located in my office.  Consent was obtained prior to the virtual visit and is aware of possible charges through their insurance for this visit.  The patient is an established patient.  Dr. Estanislado Pandy, MD conducted the virtual visit and Hazel Sams, PA-C acted as scribe during the service.  Office staff helped with scheduling follow up visits after the service was conducted.   The patient's daughter was present for visit and acted as primary historian.     I discussed the limitations, risks, security and privacy concerns of performing an evaluation and management service by telephone and the availability of in person appointments. I also discussed with the patient that there may be a patient responsible charge related to this service. The patient expressed understanding and agreed to proceed.  CC: Medication monitoring  History of Present Illness: Patient is a 86 year old male with a past medical history of osteoporosis and gout.   He is on Fosamax 70 mg po once weekly for management of osteoporosis.  He continues to take calcium and vitamin D.  He is exercising on a daily basis.  He has not had any falls or fractures recently. He is taking Allopurinol 100 mg 1 tablet po daily for management of gout.   He denies any recent gout flares.  He denies any joint pain or joint swelling currently.  Review of Systems  Constitutional: Negative for fever and malaise/fatigue.  Eyes: Negative for photophobia, pain, discharge and redness.  Respiratory: Positive for shortness of breath (Due to COPD). Negative for cough and wheezing.   Cardiovascular: Negative for chest pain, palpitations and leg  swelling.  Gastrointestinal: Positive for constipation and diarrhea. Negative for blood in stool.  Genitourinary: Negative for dysuria and urgency.  Musculoskeletal: Negative for back pain, joint pain, myalgias and neck pain.  Skin: Negative for rash.  Neurological: Negative for dizziness and headaches.  Psychiatric/Behavioral: Positive for memory loss. Negative for depression. The patient is not nervous/anxious and does not have insomnia.      Observations/Objective:  Physical Exam  Constitutional: He is oriented to person, place, and time.  Neurological: He is alert and oriented to person, place, and time.  Psychiatric: Mood, memory, affect and judgment normal.     Patient reports morning stiffness for  0 NONE.   Patient denies nocturnal pain.  Difficulty dressing/grooming: Denies Difficulty climbing stairs: Denies Difficulty getting out of chair: Denies Difficulty using hands for taps, buttons, cutlery, and/or writing: Denies  Assessment and Plan: Visit Diagnoses:Age-related osteoporosis with current pathological fracture with routine healing, subsequent encounter- History of T12 compression fracture in 1999. L3 lumbar vertebral compression fracture. He is taking Fosamax 70 mg 1 tablet by mouth once weekly.  He continues to take a calcium and vitamin D supplement.  He has not had any falls or fractures recently.  He is exercising on a daily basis. DEXA 02/17/2018 T-score -2.7 increased by 11.4%.  He is due to update DEXA in May 2021, and he will have his PCP order the DEXA.  He will follow up in 4 months.  Idiopathic chronic gout of multiple sites without tophus-He has not had any recent gout flares (in several years).  He continues to take Allopurinol 100 mg po daily prescribed by Dr. Lenna Gilford. uric acid was 5.3 on 05/20/19.    Other medical problems are listed as follows:  Chronic systemic steroid treatment for COPD.  History of COPD  Former smoker  Automatic implantable  cardioverter-defibrillator in situ  History of diverticulosis  COPD GOLD III  Peripheral vascular disease (HCC)  SIADH (syndrome of inappropriate ADH production) (HCC)  Basal cell carcinoma (BCC) of auricle of left ear  Closed compression fracture of L3 lumbar vertebra, sequela  CAD S/P percutaneous coronary angioplasty  Chronic combined systolic and diastolic CHF (congestive heart failure) ([facility])  Cardiomyopathy, ischemic  Pulmonary hypertension (HCC)  Essential hypertension  History of subdural hematoma  History of traumatic brain injury  Acute on chronic combined systolic and diastolic congestive heart failure ([place])  Follow Up Instructions: He will follow up in 4 months   I discussed the assessment and treatment plan with the patient. The patient was provided an opportunity to ask questions and all were answered. The patient agreed with the plan and demonstrated an understanding of the instructions.   The patient was advised to call back or seek an in-person evaluation if the symptoms worsen or if the condition fails to improve as anticipated.  I provided 15 minutes of non-face-to-face time during this encounter.  Bo Merino, MD   Scribed by-  Hazel Sams, PA-C

## 2019-11-03 ENCOUNTER — Other Ambulatory Visit: Payer: Self-pay

## 2019-11-03 ENCOUNTER — Telehealth (INDEPENDENT_AMBULATORY_CARE_PROVIDER_SITE_OTHER): Payer: Medicare Other | Admitting: Rheumatology

## 2019-11-03 ENCOUNTER — Encounter: Payer: Self-pay | Admitting: Rheumatology

## 2019-11-03 DIAGNOSIS — Z8709 Personal history of other diseases of the respiratory system: Secondary | ICD-10-CM

## 2019-11-03 DIAGNOSIS — I10 Essential (primary) hypertension: Secondary | ICD-10-CM

## 2019-11-03 DIAGNOSIS — M1A09X0 Idiopathic chronic gout, multiple sites, without tophus (tophi): Secondary | ICD-10-CM | POA: Diagnosis not present

## 2019-11-03 DIAGNOSIS — Z95810 Presence of automatic (implantable) cardiac defibrillator: Secondary | ICD-10-CM | POA: Diagnosis not present

## 2019-11-03 DIAGNOSIS — Z87820 Personal history of traumatic brain injury: Secondary | ICD-10-CM

## 2019-11-03 DIAGNOSIS — I739 Peripheral vascular disease, unspecified: Secondary | ICD-10-CM

## 2019-11-03 DIAGNOSIS — E559 Vitamin D deficiency, unspecified: Secondary | ICD-10-CM

## 2019-11-03 DIAGNOSIS — M8000XD Age-related osteoporosis with current pathological fracture, unspecified site, subsequent encounter for fracture with routine healing: Secondary | ICD-10-CM

## 2019-11-03 DIAGNOSIS — Z8719 Personal history of other diseases of the digestive system: Secondary | ICD-10-CM | POA: Diagnosis not present

## 2019-11-03 DIAGNOSIS — J449 Chronic obstructive pulmonary disease, unspecified: Secondary | ICD-10-CM

## 2019-11-03 DIAGNOSIS — E222 Syndrome of inappropriate secretion of antidiuretic hormone: Secondary | ICD-10-CM

## 2019-11-03 DIAGNOSIS — I5042 Chronic combined systolic (congestive) and diastolic (congestive) heart failure: Secondary | ICD-10-CM

## 2019-11-03 DIAGNOSIS — S32030S Wedge compression fracture of third lumbar vertebra, sequela: Secondary | ICD-10-CM

## 2019-11-03 DIAGNOSIS — I255 Ischemic cardiomyopathy: Secondary | ICD-10-CM

## 2019-11-03 DIAGNOSIS — C44219 Basal cell carcinoma of skin of left ear and external auricular canal: Secondary | ICD-10-CM

## 2019-11-03 DIAGNOSIS — I5043 Acute on chronic combined systolic (congestive) and diastolic (congestive) heart failure: Secondary | ICD-10-CM

## 2019-11-03 DIAGNOSIS — I2510 Atherosclerotic heart disease of native coronary artery without angina pectoris: Secondary | ICD-10-CM | POA: Diagnosis not present

## 2019-11-03 DIAGNOSIS — Z9861 Coronary angioplasty status: Secondary | ICD-10-CM

## 2019-11-03 DIAGNOSIS — Z8679 Personal history of other diseases of the circulatory system: Secondary | ICD-10-CM

## 2019-11-03 DIAGNOSIS — I272 Pulmonary hypertension, unspecified: Secondary | ICD-10-CM

## 2019-11-03 DIAGNOSIS — Z87891 Personal history of nicotine dependence: Secondary | ICD-10-CM

## 2019-11-04 ENCOUNTER — Encounter: Payer: Self-pay | Admitting: Family Medicine

## 2019-11-04 ENCOUNTER — Ambulatory Visit (INDEPENDENT_AMBULATORY_CARE_PROVIDER_SITE_OTHER): Payer: Medicare Other | Admitting: *Deleted

## 2019-11-04 DIAGNOSIS — I472 Ventricular tachycardia, unspecified: Secondary | ICD-10-CM

## 2019-11-04 LAB — CUP PACEART REMOTE DEVICE CHECK
Battery Remaining Longevity: 65 mo
Battery Voltage: 2.98 V
Brady Statistic AP VP Percent: 0.06 %
Brady Statistic AP VS Percent: 99.42 %
Brady Statistic AS VP Percent: 0 %
Brady Statistic AS VS Percent: 0.52 %
Brady Statistic RA Percent Paced: 99.48 %
Brady Statistic RV Percent Paced: 0.06 %
Date Time Interrogation Session: 20210113022723
HighPow Impedance: 44 Ohm
HighPow Impedance: 61 Ohm
Implantable Lead Implant Date: 20090623
Implantable Lead Implant Date: 20090623
Implantable Lead Location: 753859
Implantable Lead Location: 753860
Implantable Lead Model: 5076
Implantable Lead Model: 6947
Implantable Pulse Generator Implant Date: 20170616
Lead Channel Impedance Value: 342 Ohm
Lead Channel Impedance Value: 361 Ohm
Lead Channel Impedance Value: 418 Ohm
Lead Channel Pacing Threshold Amplitude: 1 V
Lead Channel Pacing Threshold Amplitude: 1 V
Lead Channel Pacing Threshold Pulse Width: 0.4 ms
Lead Channel Pacing Threshold Pulse Width: 0.4 ms
Lead Channel Sensing Intrinsic Amplitude: 0.75 mV
Lead Channel Sensing Intrinsic Amplitude: 0.75 mV
Lead Channel Sensing Intrinsic Amplitude: 7.375 mV
Lead Channel Sensing Intrinsic Amplitude: 7.375 mV
Lead Channel Setting Pacing Amplitude: 2.25 V
Lead Channel Setting Pacing Amplitude: 2.5 V
Lead Channel Setting Pacing Pulse Width: 0.4 ms
Lead Channel Setting Sensing Sensitivity: 0.6 mV

## 2019-11-22 ENCOUNTER — Other Ambulatory Visit: Payer: Self-pay | Admitting: Cardiology

## 2019-11-23 ENCOUNTER — Ambulatory Visit: Payer: Medicare Other

## 2019-11-23 ENCOUNTER — Other Ambulatory Visit: Payer: Self-pay | Admitting: Cardiology

## 2019-11-23 DIAGNOSIS — I5042 Chronic combined systolic (congestive) and diastolic (congestive) heart failure: Secondary | ICD-10-CM

## 2019-11-23 DIAGNOSIS — Z95810 Presence of automatic (implantable) cardiac defibrillator: Secondary | ICD-10-CM

## 2019-11-25 ENCOUNTER — Telehealth: Payer: Self-pay

## 2019-11-25 NOTE — Telephone Encounter (Signed)
Remote ICM transmission received.  Attempted call to [name] regarding ICM remote transmission and left detailed message per DPR.  Advised to return call for any fluid symptoms or questions. Next ICM remote transmission scheduled 12/28/2019.

## 2019-11-25 NOTE — Progress Notes (Signed)
EPIC Encounter for ICM Monitoring  Patient Name: [name] is a 86 y.o. male Date: 11/25/2019 Primary Care Physican: Caren Macadam, MD Primary Cardiologist: Hochrein Electrophysiologist: Faustino Congress Weight:unknown  Attempted call to daughter, [name]) and left detailed message per [facility].  Transmission reviewed.    Optivol thoracic impedancenormal.  Prescribed: Furosemide20 mg 1 tabletdaily.Potassium 10 mEq 1 tabletdaily.   Labs: 05/20/2019 Creatinine 1.28, BUN 21, Potassium 4.9, Sodium 136, GFR 50-58 A complete set of results can be found in Results Review.  Recommendations: Left voice mail with ICM number and encouraged to call if experiencing any fluid symptoms.  Follow-up plan: ICM clinic phone appointment on 12/28/2019.   91 day device clinic remote transmission 02/03/2020.   Copy of ICM check sent to Dr. Caryl Comes.   3 month ICM trend: 11/23/2019    1 Year ICM trend:       Jesse Billings, RN 11/25/2019 11:00 AM

## 2019-11-26 ENCOUNTER — Other Ambulatory Visit: Payer: Self-pay

## 2019-11-27 ENCOUNTER — Ambulatory Visit (INDEPENDENT_AMBULATORY_CARE_PROVIDER_SITE_OTHER): Payer: Medicare Other | Admitting: Family Medicine

## 2019-11-27 ENCOUNTER — Encounter: Payer: Self-pay | Admitting: Family Medicine

## 2019-11-27 VITALS — BP 112/60 | HR 65 | Temp 97.5°F | Ht 71.0 in | Wt 182.6 lb

## 2019-11-27 DIAGNOSIS — I5042 Chronic combined systolic (congestive) and diastolic (congestive) heart failure: Secondary | ICD-10-CM

## 2019-11-27 DIAGNOSIS — I255 Ischemic cardiomyopathy: Secondary | ICD-10-CM | POA: Diagnosis not present

## 2019-11-27 DIAGNOSIS — I2510 Atherosclerotic heart disease of native coronary artery without angina pectoris: Secondary | ICD-10-CM | POA: Diagnosis not present

## 2019-11-27 DIAGNOSIS — I10 Essential (primary) hypertension: Secondary | ICD-10-CM

## 2019-11-27 DIAGNOSIS — M1A09X0 Idiopathic chronic gout, multiple sites, without tophus (tophi): Secondary | ICD-10-CM

## 2019-11-27 DIAGNOSIS — Z9861 Coronary angioplasty status: Secondary | ICD-10-CM | POA: Diagnosis not present

## 2019-11-27 DIAGNOSIS — E039 Hypothyroidism, unspecified: Secondary | ICD-10-CM | POA: Diagnosis not present

## 2019-11-27 DIAGNOSIS — E785 Hyperlipidemia, unspecified: Secondary | ICD-10-CM

## 2019-11-27 DIAGNOSIS — M810 Age-related osteoporosis without current pathological fracture: Secondary | ICD-10-CM | POA: Diagnosis not present

## 2019-11-27 LAB — COMPREHENSIVE METABOLIC PANEL
ALT: 24 U/L (ref 0–53)
AST: 41 U/L — ABNORMAL HIGH (ref 0–37)
Albumin: 3.9 g/dL (ref 3.5–5.2)
Alkaline Phosphatase: 94 U/L (ref 39–117)
BUN: 22 mg/dL (ref 6–23)
CO2: 29 mEq/L (ref 19–32)
Calcium: 9.2 mg/dL (ref 8.4–10.5)
Chloride: 101 mEq/L (ref 96–112)
Creatinine, Ser: 1.4 mg/dL (ref 0.40–1.50)
GFR: 47.97 mL/min — ABNORMAL LOW (ref >60.00)
Glucose, Bld: 76 mg/dL (ref 70–99)
Potassium: 4.7 mEq/L (ref 3.5–5.1)
Sodium: 136 mEq/L (ref 135–145)
Total Bilirubin: 0.5 mg/dL (ref 0.2–1.2)
Total Protein: 7.5 g/dL (ref 6.0–8.3)

## 2019-11-27 LAB — LIPID PANEL
Cholesterol: 159 mg/dL (ref 0–200)
HDL: 29.4 mg/dL — ABNORMAL LOW (ref >39.00)
NonHDL: 129.12
Total CHOL/HDL Ratio: 5
Triglycerides: 247 mg/dL — ABNORMAL HIGH (ref 0.0–149.0)
VLDL: 49.4 mg/dL — ABNORMAL HIGH (ref 0.0–40.0)

## 2019-11-27 LAB — CBC WITH DIFFERENTIAL/PLATELET
Basophils Absolute: 0.1 10*3/uL (ref 0.0–0.1)
Basophils Relative: 0.8 % (ref 0.0–3.0)
Eosinophils Absolute: 2.2 10*3/uL — ABNORMAL HIGH (ref 0.0–0.7)
Eosinophils Relative: 23.3 % — ABNORMAL HIGH (ref 0.0–5.0)
HCT: 38.7 % — ABNORMAL LOW (ref 39.0–52.0)
Hemoglobin: 13 g/dL (ref 13.0–17.0)
Lymphocytes Relative: 16.7 % (ref 12.0–46.0)
Lymphs Abs: 1.6 10*3/uL (ref 0.7–4.0)
MCHC: 33.6 g/dL (ref 30.0–36.0)
MCV: 91.3 fl (ref 78.0–100.0)
Monocytes Absolute: 0.6 10*3/uL (ref 0.1–1.0)
Monocytes Relative: 6.2 % (ref 3.0–12.0)
Neutro Abs: 5 10*3/uL (ref 1.4–7.7)
Neutrophils Relative %: 53 % (ref 43.0–77.0)
Platelets: 142 10*3/uL — ABNORMAL LOW (ref 150.0–400.0)
RBC: 4.24 Mil/uL (ref 4.22–5.81)
RDW: 16.4 % — ABNORMAL HIGH (ref 11.5–15.5)
WBC: 9.5 10*3/uL (ref 4.0–10.5)

## 2019-11-27 LAB — VITAMIN D 25 HYDROXY (VIT D DEFICIENCY, FRACTURES): VITD: 39.81 ng/mL (ref 30.00–100.00)

## 2019-11-27 LAB — TSH: TSH: 9.43 u[IU]/mL — ABNORMAL HIGH (ref 0.35–4.50)

## 2019-11-27 LAB — URIC ACID: Uric Acid, Serum: 6.1 mg/dL (ref 4.0–7.8)

## 2019-11-27 LAB — LDL CHOLESTEROL, DIRECT: Direct LDL: 92 mg/dL

## 2019-11-27 NOTE — Progress Notes (Signed)
[name] DOB: 1931-03-24 Encounter date: 11/27/2019  This is a 86 y.o. male who presents with Chief Complaint  Patient presents with  . Follow-up    History of present illness: Following with Dr. Estanislado Pandy for osteoporosis and gout.  He has been stable on Fosamax and allopurinol for this.  Following with Dr. Percival Spanish for history of AAA repair in 2002, coronary artery disease, combined systolic and diastolic heart failure, ICM status post ICD 2009, hypertension, hyperlipidemia. States that bp tends to be low. In morning states that systolic is below [id number]. He does work out and do daily exercises which brings up the blood pressure. Runs between 120-140. Not feeling light headed with the low pressures, not had any falls. For exercise - doing those recommended by therapist - leg exercises - standing to do these and then sitting to do other exercises - 20 minutes total. No dizziness or light headedness even while exercises.   Gets second COVID shot next week.   Generally in a good mood. Sleeping well.   Wakes usually once at night and needs to adjust oxygen tubes.   No problems with urination.   Sporadic bowel movements. If he doesn't take laxative, then gets constipated, but manages well with this.   Walk out to get paper every morning wears him out. Longer than walk back to this room. Doesn't feel like breathing has worsened at all.   Appetite is very good.   Allergies  Allergen Reactions  . Lisinopril     BP dropped too low per daughter   Current Meds  Medication Sig  . albuterol (PROVENTIL HFA;VENTOLIN HFA) 108 (90 Base) MCG/ACT inhaler Inhale 2 puffs into the lungs every 6 (six) hours as needed for wheezing or shortness of breath.  Marland Kitchen alendronate (FOSAMAX) 70 MG tablet TAKE 1 TAB ONCE A WEEK, AT LEAST 30 MIN BEFORE 1ST FOOD.DO NOT LIE DOWN FOR 30 MIN AFTER TAKING.  Marland Kitchen allopurinol (ZYLOPRIM) 100 MG tablet Take 1 tablet (100 mg total) by mouth daily.  Marland Kitchen amiodarone  (PACERONE) 200 MG tablet TAKE 1 TABLET (200MG ) BY MOUTH 5 DAYS PER WEEK.  Marland Kitchen aspirin EC 81 MG tablet Take 81 mg by mouth daily.  . budesonide (PULMICORT) 0.5 MG/2ML nebulizer solution Take 2 mLs (0.5 mg total) by nebulization 2 (two) times daily.  . carvedilol (COREG) 3.125 MG tablet TAKE 1 TABLET BY MOUTH TWICE DAILY WITH A MEAL.  . cholecalciferol (VITAMIN D) 1000 units tablet Take 1,000 Units by mouth daily.  . fluticasone (CUTIVATE) 0.05 % cream Apply 1 application topically 2 (two) times daily.   . formoterol (PERFOROMIST) 20 MCG/2ML nebulizer solution Take 2 mLs (20 mcg total) by nebulization 2 (two) times daily.  . furosemide (LASIX) 20 MG tablet TAKE 1 TABLET ONCE DAILY.  Marland Kitchen guaiFENesin (MUCINEX) 600 MG 12 hr tablet Take 600 mg by mouth 2 (two) times daily.   Marland Kitchen levothyroxine (SYNTHROID) 100 MCG tablet Take 1 tablet (100 mcg total) by mouth daily.  . magnesium oxide (MAG-OX) 400 (241.3 Mg) MG tablet Take 0.5 tablets (200 mg total) by mouth daily.  . midodrine (PROAMATINE) 2.5 MG tablet TAKE 1 TABLET 3 TIMES DAILY WITH MEALS.  . Multiple Vitamins-Minerals (PRESERVISION AREDS 2 PO) Take 1 capsule by mouth daily.  Marland Kitchen oxybutynin (DITROPAN) 5 MG tablet TAKE 1 TABLET BY MOUTH TWICE DAILY.  Marland Kitchen polyethylene glycol powder (QC NATURA-LAX) powder 1 capful in 8oz water twice daily (Patient taking differently: 1 capful in [place] water as needed)  .  potassium chloride (KLOR-CON) 10 MEQ tablet TAKE 1 TABLET ONCE DAILY.  . pravastatin (PRAVACHOL) 40 MG tablet Take 1 tablet (40 mg total) by mouth daily.  . pravastatin (PRAVACHOL) 40 MG tablet TAKE 1 TABLET ONCE DAILY.  Marland Kitchen Revefenacin (YUPELRI) 175 MCG/3ML SOLN Inhale 3 mLs into the lungs daily.  . sertraline (ZOLOFT) 50 MG tablet TAKE ONE TABLET AT BEDTIME.  Marland Kitchen thiamine 100 MG tablet Take 1 tablet (100 mg total) by mouth daily.  . [DISCONTINUED] pravastatin (PRAVACHOL) 40 MG tablet TAKE 1 TABLET ONCE DAILY.    Review of Systems  Constitutional: Negative for  chills, fatigue and fever.  Respiratory: Positive for shortness of breath (with prolonged exertion, stable). Negative for cough, chest tightness and wheezing.   Cardiovascular: Negative for chest pain, palpitations and leg swelling.    Objective:  BP 112/60 (BP Location: Right Arm, Patient Position: Sitting, Cuff Size: Normal)   Pulse 65   Temp (!) 97.5 F (36.4 C) (Temporal)   Ht 5\' 11"  (1.803 m)   Wt 182 lb 9.6 oz (82.8 kg)   SpO2 98%   BMI 25.47 kg/m   Weight: 182 lb 9.6 oz (82.8 kg)   BP Readings from Last 3 Encounters:  11/27/19 112/60  10/01/19 139/84  05/20/19 120/72   Wt Readings from Last 3 Encounters:  11/27/19 182 lb 9.6 oz (82.8 kg)  10/01/19 184 lb 12.8 oz (83.8 kg)  05/20/19 185 lb 6.4 oz (84.1 kg)    Physical Exam Constitutional:      General: He is not in acute distress.    Appearance: He is well-developed.  Cardiovascular:     Rate and Rhythm: Normal rate and regular rhythm.     Heart sounds: Murmur present. Systolic murmur present with a grade of 2/6. No friction rub.  Pulmonary:     Effort: Pulmonary effort is normal. No respiratory distress.     Breath sounds: Examination of the right-lower field reveals rales. Examination of the left-lower field reveals rales. Rales present. No wheezing.     Comments: Mild bilateral lower lobe Rales consistent with atelectasis. Musculoskeletal:     Right lower leg: No edema.     Left lower leg: No edema (trace).  Skin:    Comments: Left forearm there is an approximately 1 cm light pink and scaly papule with central keratin  Neurological:     Mental Status: He is alert and oriented to person, place, and time.  Psychiatric:        Behavior: Behavior normal.     Assessment/Plan  1. Essential hypertension Blood pressure has been stable.  We reviewed taking time with position changes to ensure he is not becoming hypotensive.  Continue to monitor pressures at home and report any symptoms of lightheadedness,  dizziness, or blood pressures that are lower than he has been getting. - CBC with Differential/Platelet; Future - Comprehensive metabolic panel; Future - Comprehensive metabolic panel - CBC with Differential/Platelet  2. Cardiomyopathy, ischemic Following with cardiology.  Cardiac symptoms are stable.  He is continuing to exercise on a daily basis and feels well overall.  3. CAD S/P percutaneous coronary angioplasty See above.  Blood pressure and cholesterol been well controlled.  4. Chronic combined systolic and diastolic CHF (congestive heart failure) ([place]) See above.  Breathing is stable.  No significant edema.  5. Osteoporosis without current pathological fracture, unspecified osteoporosis type Following with rheumatology.  He is on Fosamax and taking vitamin D supplementation. - VITAMIN D 25 Hydroxy (Vit-D Deficiency, Fractures);  Future - VITAMIN D 25 Hydroxy (Vit-D Deficiency, Fractures)  6. Idiopathic chronic gout of multiple sites without tophus He has not had any recent flares.  He has been stable with allopurinol. - Uric acid; Future - Uric acid  7. Hypothyroidism, unspecified type We will recheck levels today.  Currently taking Synthroid 100 mcg daily. - TSH; Future - TSH  8. Dyslipidemia Tolerating pravastatin 40 mg daily. - Lipid panel; Future - Lipid panel   Return for awv with nurse 3 months; CCV in 6 months.  Advised patient to call Dr. Nevada Crane to get in for dermatology appointment.  I have concerns that the new lesion on his left forearm, which he reports has been there for 2 weeks, may be a squamous cell carcinoma.  I encouraged him to let me know if he is not able to get an appointment within the next couple of weeks.   Micheline Rough, MD

## 2019-11-28 ENCOUNTER — Other Ambulatory Visit: Payer: Self-pay | Admitting: Internal Medicine

## 2019-11-29 ENCOUNTER — Other Ambulatory Visit: Payer: Self-pay | Admitting: Family Medicine

## 2019-12-01 DIAGNOSIS — D0439 Carcinoma in situ of skin of other parts of face: Secondary | ICD-10-CM | POA: Diagnosis not present

## 2019-12-01 DIAGNOSIS — C44629 Squamous cell carcinoma of skin of left upper limb, including shoulder: Secondary | ICD-10-CM | POA: Diagnosis not present

## 2019-12-01 DIAGNOSIS — B078 Other viral warts: Secondary | ICD-10-CM | POA: Diagnosis not present

## 2019-12-01 MED ORDER — LEVOTHYROXINE SODIUM 112 MCG PO TABS: 112.0000 ug | ORAL_TABLET | Freq: Every day | ORAL | 1 refills | Status: DC

## 2019-12-01 NOTE — Addendum Note (Signed)
Addended by: Agnes Lawrence on: 12/01/2019 08:51 AM   Modules accepted: Orders

## 2019-12-06 ENCOUNTER — Other Ambulatory Visit: Payer: Self-pay | Admitting: Family Medicine

## 2019-12-13 ENCOUNTER — Other Ambulatory Visit: Payer: Self-pay | Admitting: Family Medicine

## 2019-12-20 ENCOUNTER — Other Ambulatory Visit: Payer: Self-pay | Admitting: Rheumatology

## 2019-12-21 NOTE — Telephone Encounter (Signed)
Ok to refill Fosamax.  We will continue to monitor his GFR closely.

## 2019-12-21 NOTE — Telephone Encounter (Signed)
Last visit: 11/03/19 Next Visit: 03/01/20 Labs: 11/27/19 AST 41 GFR 47.97, HCT 38.7 RDW 16.4 Platelets 142 Eosinophils Relative 23.3, Eosinophils Absolute 2.2    Okay to refill Fosamax?

## 2019-12-24 ENCOUNTER — Telehealth: Payer: Self-pay | Admitting: Pulmonary Disease

## 2019-12-24 ENCOUNTER — Ambulatory Visit (INDEPENDENT_AMBULATORY_CARE_PROVIDER_SITE_OTHER): Payer: Medicare Other | Admitting: Adult Health

## 2019-12-24 ENCOUNTER — Encounter: Payer: Self-pay | Admitting: Adult Health

## 2019-12-24 DIAGNOSIS — Z9981 Dependence on supplemental oxygen: Secondary | ICD-10-CM | POA: Diagnosis not present

## 2019-12-24 DIAGNOSIS — J449 Chronic obstructive pulmonary disease, unspecified: Secondary | ICD-10-CM

## 2019-12-24 DIAGNOSIS — J9611 Chronic respiratory failure with hypoxia: Secondary | ICD-10-CM | POA: Diagnosis not present

## 2019-12-24 NOTE — Telephone Encounter (Signed)
Patient last seen by Dr. Valeta Harms 12/08/18. The medications requested by [name] were issued in 2020. The patient currently does not have any appointments scheduled with clinic, does have active my chart account.  Called [facility] back, spoke [name] she stated they sent a prescription to the patient 12/21/19, but they need recent notes for the patient in order to continue sending the medication.  I called the patient and he confirmed he did get the medication today and explained why a visit was needed. He agreed to a telephone visit today at 2:00 with Jesse Edison, NP.  Nothing further needed at this time.

## 2019-12-24 NOTE — Progress Notes (Signed)
Virtual Visit via Telephone Note  I connected with [name] on 12/24/19 at  2:00 PM EST by telephone and verified that I am speaking with the correct person using two identifiers.  Location: Patient: Home  Provider: Office    I discussed the limitations, risks, security and privacy concerns of performing an evaluation and management service by telephone and the availability of in person appointments. I also discussed with the patient that there may be a patient responsible charge related to this service. The patient expressed understanding and agreed to proceed.   History of Present Illness: 86 year old male former smoker followed for gold 3 COPD and chronic hypoxic respiratory failure on nocturnal oxygen at 2 L.  Today's televisit is a 1 year follow-up for COPD.  Patient says he has been doing exceptionally well over the last year.  Patient says he tries to stay busy. Does light activity , can walk short distance outside. , light exercises at home  He remains on Perforomist and budesonide nebulizers twice daily and Yupelri once daily.  He is on oxygen at bedtime at 2 L.  He denies any increased oxygen demands.  Says he has received both of his Covid vaccines.  He remains independent at home.  He denies any chest pain orthopnea PND or increased leg swelling Was previously on chronic low dose prednisone but it appears that this has been tapered off since last visit 1 year ago.  Patient Active Problem List   Diagnosis Date Noted  . Coronary artery disease involving native coronary artery of native heart without angina pectoris 09/30/2019  . Dyslipidemia 09/30/2019  . Educated about COVID-19 virus infection 03/20/2019  . VT (ventricular tachycardia) ([place]) 03/20/2019  . Chronic hypoxemic respiratory failure ([place]) 08/26/2018  . Gait abnormality 08/26/2018  . Lobar pneumonia, unspecified organism ([place]) 08/04/2018  . Lower GI bleeding 07/21/2018  . Forehead laceration, initial  encounter 07/21/2018  . CKD (chronic kidney disease) stage 3, GFR 30-59 ml/min 07/21/2018  . History of pneumonia 07/15/2018  . Physical deconditioning 11/13/2017  . Weakness 11/12/2017  . Falls 11/12/2017  . Pleural effusion   . CHF exacerbation ([place]) 09/30/2017  . Shingles outbreak 05/27/2017  . Seborrheic dermatitis 05/27/2017  . Acute on chronic respiratory failure with hypoxia ([place]) 01/13/2017  . COPD with acute bronchitis ([place]) 01/13/2017  . CAD S/P percutaneous coronary angioplasty   . Chronic combined systolic and diastolic CHF (congestive heart failure) ([place])   . Blunt chest trauma 05/04/2016  . 4.1 cm Aneurysm of thoracic aorta ([place]) 04/27/2016  . SIADH (syndrome of inappropriate ADH production) ([place])   . Orthostatic hypotension 04/12/2016  . TBI (traumatic brain injury) ([place])   . Acute on chronic combined systolic and diastolic congestive heart failure ([place])   . S/P ORIF (open reduction internal fixation) fracture   . Thrombocytopenia ([place])   . Urinary retention   . Slow transit constipation   . Thyroid activity decreased   . Traumatic subdural hematoma with loss of consciousness ([place])   . Pulmonary hypertension ([place])   . Cardiomyopathy, ischemic   . Subdural hematoma ([place])   . Cardiac arrest ([place]) 03/29/2016  . 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 T wave over sensing resulting in inappropriate shocks 08/14/2013  . Vitamin D deficiency disease 04/09/2012  . Actinic skin damage 05/22/2011  . Alcohol use 03/22/2011  . Other dysphagia 02/16/2011  . Carotid arterial disease ([place]) 11/17/2009  . GOUT 09/23/2009  . Age-related osteoporosis with current pathological  fracture 09/23/2009  . SYNCOPE 04/19/2009  . Automatic implantable cardioverter-defibrillator in situ 04/13/2008  . COLONIC POLYPS 12/09/2007  . Diverticulosis of colon 12/09/2007  . Essential hypertension 12/08/2007  . Peripheral vascular disease ([place]) 12/08/2007  . COPD GOLD III 12/08/2007   . Osteoarthritis 12/08/2007    , Current Outpatient Medications on File Prior to Visit  Medication Sig Dispense Refill  . albuterol (PROVENTIL HFA;VENTOLIN HFA) 108 (90 Base) MCG/ACT inhaler Inhale 2 puffs into the lungs every 6 (six) hours as needed for wheezing or shortness of breath. 1 Inhaler 2  . alendronate (FOSAMAX) 70 MG tablet TAKE 1 TAB ONCE A WEEK, AT LEAST 30 MIN BEFORE 1ST FOOD.DO NOT LIE DOWN FOR 30 MIN AFTER TAKING. 12 tablet 0  . allopurinol (ZYLOPRIM) 100 MG tablet Take 1 tablet (100 mg total) by mouth daily. 90 tablet 3  . amiodarone (PACERONE) 200 MG tablet TAKE 1 TABLET (200MG ) BY MOUTH 5 DAYS PER WEEK. 90 tablet 1  . aspirin EC 81 MG tablet Take 81 mg by mouth daily.    . budesonide (PULMICORT) 0.5 MG/2ML nebulizer solution Take 2 mLs (0.5 mg total) by nebulization 2 (two) times daily. 120 mL 0  . carvedilol (COREG) 3.125 MG tablet TAKE 1 TABLET BY MOUTH TWICE DAILY WITH A MEAL. 60 tablet 4  . cholecalciferol (VITAMIN D) 1000 units tablet Take 1,000 Units by mouth daily.    . fluticasone (CUTIVATE) 0.05 % cream Apply 1 application topically 2 (two) times daily.   2  . formoterol (PERFOROMIST) 20 MCG/2ML nebulizer solution Take 2 mLs (20 mcg total) by nebulization 2 (two) times daily. 120 mL 5  . furosemide (LASIX) 20 MG tablet TAKE 1 TABLET ONCE DAILY. 30 tablet 5  . guaiFENesin (MUCINEX) 600 MG 12 hr tablet Take 600 mg by mouth 2 (two) times daily.     Marland Kitchen levothyroxine (SYNTHROID) 112 MCG tablet Take 1 tablet (112 mcg total) by mouth daily. 90 tablet 1  . magnesium oxide (MAG-OX) 400 (241.3 Mg) MG tablet Take 0.5 tablets (200 mg total) by mouth daily. 30 tablet 0  . midodrine (PROAMATINE) 2.5 MG tablet TAKE 1 TABLET 3 TIMES DAILY WITH MEALS. 270 tablet 3  . Multiple Vitamins-Minerals (PRESERVISION AREDS 2 PO) Take 1 capsule by mouth daily.    Marland Kitchen oxybutynin (DITROPAN) 5 MG tablet TAKE 1 TABLET BY MOUTH TWICE DAILY. 60 tablet 5  . polyethylene glycol powder (QC NATURA-LAX)  powder 1 capful in 8oz water twice daily (Patient taking differently: 1 capful in 8oz water as needed) 500 g 2  . potassium chloride (KLOR-CON) 10 MEQ tablet TAKE 1 TABLET ONCE DAILY. 30 tablet 6  . pravastatin (PRAVACHOL) 40 MG tablet Take 1 tablet (40 mg total) by mouth daily. 90 tablet 1  . pravastatin (PRAVACHOL) 40 MG tablet TAKE 1 TABLET ONCE DAILY. 90 tablet 0  . Revefenacin (YUPELRI) 175 MCG/3ML SOLN Inhale 3 mLs into the lungs daily. 120 mL 5  . sertraline (ZOLOFT) 50 MG tablet TAKE ONE TABLET AT BEDTIME. 30 tablet 5  . thiamine 100 MG tablet Take 1 tablet (100 mg total) by mouth daily. 30 tablet 0   No current facility-administered medications on file prior to visit.     Observations/Objective: No audible distress.  Assessment and Plan: Gold 3 COPD-currently stable.  Needs chest x-ray on return  Chronic hypoxic respiratory failure on nocturnal oxygen at 2 L-stable on present regimen  Plan  Patient Instructions  Continue on budesonide and Perforomist nebulizers twice daily  Continue on Yupelri nebulizer daily Activity as tolerated Continue on oxygen 2 L at bedtime Follow-up in 6 months with Dr. Valeta Harms with chest x-ray Please contact office for sooner follow up if symptoms do not improve or worsen or seek emergency care       Follow Up Instructions: Follow up in 6 months and As needed      I discussed the assessment and treatment plan with the patient. The patient was provided an opportunity to ask questions and all were answered. The patient agreed with the plan and demonstrated an understanding of the instructions.   The patient was advised to call back or seek an in-person evaluation if the symptoms worsen or if the condition fails to improve as anticipated.  I provided 22 minutes of non-face-to-face time during this encounter.   Rexene Edison, NP

## 2019-12-24 NOTE — Patient Instructions (Signed)
Continue on budesonide and Perforomist nebulizers twice daily Continue on Yupelri nebulizer daily Activity as tolerated Continue on oxygen 2 L at bedtime Follow-up in 6 months with Dr. Valeta Harms with chest x-ray Please contact office for sooner follow up if symptoms do not improve or worsen or seek emergency care

## 2019-12-27 NOTE — Progress Notes (Signed)
PCCM:  Thanks for speaking with her.   [facility] 12/27/2019 4:54 PM

## 2019-12-28 ENCOUNTER — Ambulatory Visit: Payer: Medicare Other

## 2019-12-28 DIAGNOSIS — I5042 Chronic combined systolic (congestive) and diastolic (congestive) heart failure: Secondary | ICD-10-CM

## 2019-12-28 DIAGNOSIS — Z95810 Presence of automatic (implantable) cardiac defibrillator: Secondary | ICD-10-CM

## 2019-12-29 ENCOUNTER — Encounter: Payer: Self-pay | Admitting: Family Medicine

## 2019-12-29 ENCOUNTER — Telehealth: Payer: Self-pay

## 2019-12-29 DIAGNOSIS — D045 Carcinoma in situ of skin of trunk: Secondary | ICD-10-CM | POA: Diagnosis not present

## 2019-12-29 DIAGNOSIS — Z85828 Personal history of other malignant neoplasm of skin: Secondary | ICD-10-CM | POA: Diagnosis not present

## 2019-12-29 DIAGNOSIS — Z08 Encounter for follow-up examination after completed treatment for malignant neoplasm: Secondary | ICD-10-CM | POA: Diagnosis not present

## 2019-12-29 DIAGNOSIS — D0439 Carcinoma in situ of skin of other parts of face: Secondary | ICD-10-CM | POA: Diagnosis not present

## 2019-12-29 NOTE — Progress Notes (Signed)
EPIC Encounter for ICM Monitoring  Patient Name: [name] is a 86 y.o. male Date: 12/29/2019 Primary Care Physican: Caren Macadam, MD Primary Cardiologist: [place] Electrophysiologist: Faustino Congress Weight:unknown  Attempted call to daughter, [name] [facility]) and left detailed message per [facility].  Transmission reviewed.    Optivol thoracic impedancenormal.  Prescribed: Furosemide20 mg 1 tabletdaily.Potassium 10 mEq 1 tabletdaily.   Labs: 05/20/2019 Creatinine 1.28, BUN 21, Potassium 4.9, Sodium 136, GFR 50-58 A complete set of results can be found in Results Review.  Recommendations: Left voice mail with ICM number and encouraged to call if experiencing any fluid symptoms.  Follow-up plan: ICM clinic phone appointment on 02/04/2020.   91 day device clinic remote transmission 02/03/2020.   Copy of ICM check sent to Dr. Caryl Comes.   3 month ICM trend: 12/28/2019    1 Year ICM trend:       [name], RN 12/29/2019 1:25 PM

## 2019-12-29 NOTE — Telephone Encounter (Signed)
Remote ICM transmission received.  Attempted call to [name], daugher regarding ICM remote transmission and left detailed message regarding transmission per DPR.  Advised to return call for any fluid symptoms or questions. Next ICM remote transmission scheduled 02/04/2020.

## 2019-12-31 ENCOUNTER — Telehealth: Payer: Self-pay | Admitting: Family Medicine

## 2019-12-31 ENCOUNTER — Other Ambulatory Visit: Payer: Self-pay | Admitting: Family Medicine

## 2019-12-31 DIAGNOSIS — D7219 Other eosinophilia: Secondary | ICD-10-CM

## 2019-12-31 DIAGNOSIS — E039 Hypothyroidism, unspecified: Secondary | ICD-10-CM

## 2019-12-31 NOTE — Telephone Encounter (Signed)
Pt daughter [name] was following up to [employer] sent about test results. She would like a call back.

## 2019-12-31 NOTE — Telephone Encounter (Signed)
Noted  

## 2019-12-31 NOTE — Telephone Encounter (Signed)
I responded to her today. Happy to discuss further if she still has questions.

## 2020-01-17 ENCOUNTER — Other Ambulatory Visit: Payer: Self-pay | Admitting: Family Medicine

## 2020-02-01 ENCOUNTER — Other Ambulatory Visit: Payer: Self-pay

## 2020-02-01 ENCOUNTER — Other Ambulatory Visit (INDEPENDENT_AMBULATORY_CARE_PROVIDER_SITE_OTHER): Payer: Medicare Other

## 2020-02-01 DIAGNOSIS — E039 Hypothyroidism, unspecified: Secondary | ICD-10-CM | POA: Diagnosis not present

## 2020-02-01 DIAGNOSIS — D7219 Other eosinophilia: Secondary | ICD-10-CM

## 2020-02-01 LAB — CBC WITH DIFFERENTIAL/PLATELET
Basophils Absolute: 0.1 10*3/uL (ref 0.0–0.1)
Basophils Relative: 0.6 % (ref 0.0–3.0)
Eosinophils Absolute: 2.4 10*3/uL — ABNORMAL HIGH (ref 0.0–0.7)
Eosinophils Relative: 27.4 % — ABNORMAL HIGH (ref 0.0–5.0)
HCT: 39.4 % (ref 39.0–52.0)
Hemoglobin: 13.3 g/dL (ref 13.0–17.0)
Lymphocytes Relative: 17 % (ref 12.0–46.0)
Lymphs Abs: 1.5 10*3/uL (ref 0.7–4.0)
MCHC: 33.7 g/dL (ref 30.0–36.0)
MCV: 93.7 fl (ref 78.0–100.0)
Monocytes Absolute: 0.5 10*3/uL (ref 0.1–1.0)
Monocytes Relative: 6.2 % (ref 3.0–12.0)
Neutro Abs: 4.3 10*3/uL (ref 1.4–7.7)
Neutrophils Relative %: 48.8 % (ref 43.0–77.0)
Platelets: 127 10*3/uL — ABNORMAL LOW (ref 150.0–400.0)
RBC: 4.2 Mil/uL — ABNORMAL LOW (ref 4.22–5.81)
RDW: 16.1 % — ABNORMAL HIGH (ref 11.5–15.5)
WBC: 8.7 10*3/uL (ref 4.0–10.5)

## 2020-02-01 LAB — TSH: TSH: 6.53 u[IU]/mL — ABNORMAL HIGH (ref 0.35–4.50)

## 2020-02-02 DIAGNOSIS — C44629 Squamous cell carcinoma of skin of left upper limb, including shoulder: Secondary | ICD-10-CM | POA: Diagnosis not present

## 2020-02-02 DIAGNOSIS — Z85828 Personal history of other malignant neoplasm of skin: Secondary | ICD-10-CM | POA: Diagnosis not present

## 2020-02-02 DIAGNOSIS — Z08 Encounter for follow-up examination after completed treatment for malignant neoplasm: Secondary | ICD-10-CM | POA: Diagnosis not present

## 2020-02-02 DIAGNOSIS — D0439 Carcinoma in situ of skin of other parts of face: Secondary | ICD-10-CM | POA: Diagnosis not present

## 2020-02-02 NOTE — Progress Notes (Signed)
Platelets are low.  He has a history of thrombocytopenia.   Relative and absolute eosinophils are also elevated.  Please notify patient and forward results to PCP.

## 2020-02-03 ENCOUNTER — Ambulatory Visit (INDEPENDENT_AMBULATORY_CARE_PROVIDER_SITE_OTHER): Payer: Medicare Other | Admitting: *Deleted

## 2020-02-03 DIAGNOSIS — I472 Ventricular tachycardia, unspecified: Secondary | ICD-10-CM

## 2020-02-03 LAB — CUP PACEART REMOTE DEVICE CHECK
Battery Remaining Longevity: 60 mo
Battery Voltage: 2.98 V
Brady Statistic AP VP Percent: 0.07 %
Brady Statistic AP VS Percent: 99.32 %
Brady Statistic AS VP Percent: 0 %
Brady Statistic AS VS Percent: 0.61 %
Brady Statistic RA Percent Paced: 99.38 %
Brady Statistic RV Percent Paced: 0.07 %
Date Time Interrogation Session: 20210414012503
HighPow Impedance: 47 Ohm
HighPow Impedance: 60 Ohm
Implantable Lead Implant Date: 20090623
Implantable Lead Implant Date: 20090623
Implantable Lead Location: 753859
Implantable Lead Location: 753860
Implantable Lead Model: 5076
Implantable Lead Model: 6947
Implantable Pulse Generator Implant Date: 20170616
Lead Channel Impedance Value: 342 Ohm
Lead Channel Impedance Value: 361 Ohm
Lead Channel Impedance Value: 418 Ohm
Lead Channel Pacing Threshold Amplitude: 0.875 V
Lead Channel Pacing Threshold Amplitude: 1 V
Lead Channel Pacing Threshold Pulse Width: 0.4 ms
Lead Channel Pacing Threshold Pulse Width: 0.4 ms
Lead Channel Sensing Intrinsic Amplitude: 0.875 mV
Lead Channel Sensing Intrinsic Amplitude: 0.875 mV
Lead Channel Sensing Intrinsic Amplitude: 8.625 mV
Lead Channel Sensing Intrinsic Amplitude: 8.625 mV
Lead Channel Setting Pacing Amplitude: 2 V
Lead Channel Setting Pacing Amplitude: 2.5 V
Lead Channel Setting Pacing Pulse Width: 0.4 ms
Lead Channel Setting Sensing Sensitivity: 0.6 mV

## 2020-02-03 MED ORDER — LEVOTHYROXINE SODIUM 125 MCG PO TABS: 125.0000 ug | ORAL_TABLET | Freq: Every day | ORAL | 1 refills | Status: DC

## 2020-02-03 NOTE — Progress Notes (Signed)
ICD Remote  

## 2020-02-04 ENCOUNTER — Ambulatory Visit (INDEPENDENT_AMBULATORY_CARE_PROVIDER_SITE_OTHER): Payer: Medicare Other

## 2020-02-04 DIAGNOSIS — I5042 Chronic combined systolic (congestive) and diastolic (congestive) heart failure: Secondary | ICD-10-CM | POA: Diagnosis not present

## 2020-02-04 DIAGNOSIS — Z95810 Presence of automatic (implantable) cardiac defibrillator: Secondary | ICD-10-CM

## 2020-02-05 NOTE — Progress Notes (Signed)
EPIC Encounter for ICM Monitoring  Patient Name: [name] is a 86 y.o. male Date: 02/05/2020 Primary Care Physican: Caren Macadam, MD Primary Cardiologist: [place] Electrophysiologist: Faustino Congress Weight:unknown  Transmission reviewed.   Optivol thoracic impedancenormal.  Prescribed: Furosemide20 mg 1 tabletdaily.Potassium 10 mEq 1 tabletdaily.   Labs: 05/20/2019 Creatinine 1.28, BUN 21, Potassium 4.9, Sodium 136, GFR 50-58 A complete set of results can be found in Results Review.  Recommendations: None  Follow-up plan: ICM clinic phone appointment on5/17/2021.91 day device clinic remote transmission 05/04/2020.   Copy of ICM check sent to [place].  3 month ICM trend: 02/03/2020    1 Year ICM trend:       [name], RN 02/05/2020 12:14 PM

## 2020-02-07 ENCOUNTER — Other Ambulatory Visit: Payer: Self-pay | Admitting: Cardiology

## 2020-02-23 NOTE — Progress Notes (Deleted)
Office Visit Note  Patient: [name]             Date of Birth: [birth year]           MRN: [id number]             PCP: Caren Macadam, MD Referring: Caren Macadam, MD Visit Date: 03/01/2020 Occupation: @GUAROCC @  Subjective:  No chief complaint on file.   History of Present Illness: [name] is a 86 y.o. male ***   Activities of Daily Living:  Patient reports morning stiffness for *** {minute/hour:19697}.   Patient {ACTIONS;DENIES/REPORTS:21021675::"Denies"} nocturnal pain.  Difficulty dressing/grooming: {ACTIONS;DENIES/REPORTS:21021675::"Denies"} Difficulty climbing stairs: {ACTIONS;DENIES/REPORTS:21021675::"Denies"} Difficulty getting out of chair: {ACTIONS;DENIES/REPORTS:21021675::"Denies"} Difficulty using hands for taps, buttons, cutlery, and/or writing: {ACTIONS;DENIES/REPORTS:21021675::"Denies"}  No Rheumatology ROS completed.   PMFS History:  Patient Active Problem List   Diagnosis Date Noted  . Coronary artery disease involving native coronary artery of native heart without angina pectoris 09/30/2019  . Dyslipidemia 09/30/2019  . Educated about COVID-19 virus infection 03/20/2019  . VT (ventricular tachycardia) ([place]) 03/20/2019  . Chronic hypoxemic respiratory failure ([place]) 08/26/2018  . Gait abnormality 08/26/2018  . Lobar pneumonia, unspecified organism ([place]) 08/04/2018  . Lower GI bleeding 07/21/2018  . Forehead laceration, initial encounter 07/21/2018  . CKD (chronic kidney disease) stage 3, GFR 30-59 ml/min 07/21/2018  . History of pneumonia 07/15/2018  . Physical deconditioning 11/13/2017  . Weakness 11/12/2017  . Falls 11/12/2017  . Pleural effusion   . CHF exacerbation ([place]) 09/30/2017  . Shingles outbreak 05/27/2017  . Seborrheic dermatitis 05/27/2017  . Acute on chronic respiratory failure with hypoxia ([place]) 01/13/2017  . COPD with acute bronchitis ([place]) 01/13/2017  . CAD S/P percutaneous coronary angioplasty   .  Chronic combined systolic and diastolic CHF (congestive heart failure) ([place])   . Blunt chest trauma 05/04/2016  . 4.1 cm Aneurysm of thoracic aorta ([place]) 04/27/2016  . SIADH (syndrome of inappropriate ADH production) ([place])   . Orthostatic hypotension 04/12/2016  . TBI (traumatic brain injury) ([place])   . Acute on chronic combined systolic and diastolic congestive heart failure ([place])   . S/P ORIF (open reduction internal fixation) fracture   . Thrombocytopenia ([place])   . Urinary retention   . Slow transit constipation   . Thyroid activity decreased   . Traumatic subdural hematoma with loss of consciousness ([place])   . Pulmonary hypertension ([place])   . Cardiomyopathy, ischemic   . Subdural hematoma ([place])   . Cardiac arrest ([place]) 03/29/2016  . 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 T wave over sensing resulting in inappropriate shocks 08/14/2013  . Vitamin D deficiency disease 04/09/2012  . Actinic skin damage 05/22/2011  . Alcohol use 03/22/2011  . Other dysphagia 02/16/2011  . Carotid arterial disease ([place]) 11/17/2009  . GOUT 09/23/2009  . Age-related osteoporosis with current pathological fracture 09/23/2009  . SYNCOPE 04/19/2009  . Automatic implantable cardioverter-defibrillator in situ 04/13/2008  . COLONIC POLYPS 12/09/2007  . Diverticulosis of colon 12/09/2007  . Essential hypertension 12/08/2007  . Peripheral vascular disease ([place]) 12/08/2007  . COPD GOLD III 12/08/2007  . Osteoarthritis 12/08/2007    Past Medical History:  Diagnosis Date  . AAA (abdominal aortic aneurysm) ([place])    a. 2002 s/p repair.  . Allergic rhinitis   . Back pain   . CAD (coronary artery disease)    a. 02/2002 H/o MI with stenting x 2; b. 04/2013 MV: EF 25%, large posterior lateral infarct w/o  ischemia; c. 03/2016 VT Arrest/Cath: LM 60ost, LAD 40p/m ISR, LCX 100p/m, RCA nl-->Med Rx.  . Carotid arterial disease ([place])    a. 12/2010 s/p R CEA;  b. 05/2015 Carotid U/S: bilat <40% ICA stenosis.    . Chronic combined systolic and diastolic CHF (congestive heart failure) ([place])    a. 03/2016 Echo: Ef 35-40%, grade 1 DD.  Marland Kitchen Compression fracture   . COPD (chronic obstructive pulmonary disease) ([place])   . Dermatitis   . Diverticulosis of colon   . DJD (degenerative joint disease)    and Gout  . Hemorrhoids   . History of colonic polyps   . History of gout   . History of pneumonia   . Hypercholesterolemia   . Hypertension   . Hypertensive heart disease   . Ischemic cardiomyopathy    a. EF prev <35%-->improved to normal by Echo 8/14:  Mild LVH, focal basal hypertrophy, EF 60-65%, normal wall motion, mild BAE, PASP 36; c. 03/2016 Echo: EF 35-40%, Gr1 DD, triv AI, mild MR, mod dil LA, mild-mod TR, PASP 56mmHg.  . Osteoporosis   . Peripheral vascular disease ([place])   . Presence of cardiac defibrillator    a. 03/2008 s/p MDT [id number] Maximo II DR, DC AICD; b. 03/2013: ICD shock for T wave oversensing;  c. 10/2015: collective decision not to replace ICD given improvement in LV fxn; d. VT Arrest-->Gen change to MDT ser # [id number].  . Skin cancer    shoulders and forehead  . Syncope   . T wave over sensing resulting in inappropriate shocks    a. 03/2013.  . Tobacco abuse   . Ventricular tachycardia ([place]) 04/01/2016    Family History  Problem Relation Age of Onset  . Hypertension Father   . Heart disease Father 65       Heart Disease before age 28  . Alcohol abuse Father        causing multiple medical problems; uncertain exact cause of death  . Hypertension Mother   . Heart disease Mother        Heart Disease before age 55  . Cancer Mother        pancreatic  . Scoliosis Sister   . Thyroid disease Daughter   . Anxiety disorder Son    Past Surgical History:  Procedure Laterality Date  . ABDOMINAL AORTIC ANEURYSM REPAIR  2002   by Dr. Kellie Simmering  . aicd placed  03/2008   Dr. Caryl Comes  . cad stent  02/2002   Dr. Percival Spanish  . CARDIAC CATHETERIZATION     X 2 stents  . CARDIAC CATHETERIZATION  N/A 04/03/2016   Procedure: Left Heart Cath and Coronary Angiography;  Surgeon: Belva Crome, MD;  Location: [facility];  Service: Cardiovascular;  Laterality: N/A;  . CARDIAC DEFIBRILLATOR PLACEMENT  2009  . CARDIAC DEFIBRILLATOR PLACEMENT    . CAROTID ENDARTERECTOMY Right January 02, 2011  . EAR CYST EXCISION Left 12/13/2015   Procedure: Excision left ear lesion ;  Surgeon: Melissa Montane, MD;  Location: [facility];  Service: ENT;  Laterality: Left;  . EP IMPLANTABLE DEVICE N/A 04/06/2016   Procedure:  ICD Generator Changeout;  Surgeon: Deboraha Sprang, MD;  Location: [facility];  Service: Cardiovascular;  Laterality: N/A;  . EXCISION OF LESION LEFT EAR Left 12/13/2015  . EXTERNAL EAR SURGERY Left    Cancer Removal from left ear  . I & D EXTREMITY Left 04/23/2016   Procedure: IRRIGATION AND DEBRIDEMENT HAND;  Surgeon: Leandrew Koyanagi, MD;  Location: [facility];  Service: Orthopedics;  Laterality: Left;  . IR IMAGE GUIDED DRAINAGE BY PERCUTANEOUS CATHETER  10/08/2017  . lumbar back     done after compression fracture  . OPEN REDUCTION INTERNAL FIXATION (ORIF) METACARPAL Left 04/04/2016   Procedure: OPEN REDUCTION INTERNAL FIXATION (ORIF) LEFT 2ND, 3RD, 4TH METACARPAL FRACTURE;  Surgeon: Leandrew Koyanagi, MD;  Location: [facility];  Service: Orthopedics;  Laterality: Left;  OPEN REDUCTION INTERNAL FIXATION (ORIF) LEFT 2ND, 3RD, 4TH METACARPAL FRACTURE  . OPEN REDUCTION INTERNAL FIXATION (ORIF) METACARPAL Left 04/23/2016   Procedure: REVISION OPEN REDUCTION INTERNAL FIXATION (ORIF) 2ND METACARPAL;  Surgeon: Leandrew Koyanagi, MD;  Location: [facility];  Service: Orthopedics;  Laterality: Left;  . right corotid enderectomy  12/2010   Dr. Scot Dock  . SKIN SPLIT GRAFT Left 12/13/2015   Procedure: with possible skin graft;  Surgeon: Melissa Montane, MD;  Location: [facility];  Service: ENT;  Laterality: Left;  . TONSILLECTOMY     Social History   Social History Narrative   ** Merged History Encounter **       Lives locally.  Had  been living with wife who had become quite ill recently and died on the evening of 04/16/2016.   Immunization History  Administered Date(s) Administered  . Fluad Quad(high Dose 65+) 07/31/2019  . H1N1 10/26/2008  . Influenza Split 08/23/2011, 07/15/2012  . Influenza Whole 07/14/2008, 08/22/2009, 09/04/2010  . Influenza, High Dose Seasonal PF 07/09/2016, 09/18/2017, 07/31/2018  . Influenza,inj,Quad PF,6+ Mos 07/07/2013, 07/07/2014, 07/23/2015  . Pneumococcal Conjugate-13 10/03/2015  . Pneumococcal Polysaccharide-23 07/12/2009  . Tdap 02/10/2018     Objective: Vital Signs: There were no vitals taken for this visit.   Physical Exam   Musculoskeletal Exam: ***  CDAI Exam: CDAI Score: -- Patient Global: --; Provider Global: -- Swollen: --; Tender: -- Joint Exam 03/01/2020   No joint exam has been documented for this visit   There is currently no information documented on the homunculus. Go to the Rheumatology activity and complete the homunculus joint exam.  Investigation: No additional findings.  Imaging: CUP PACEART REMOTE DEVICE CHECK  Result Date: 02/03/2020 Scheduled remote reviewed. Normal device function.  Next remote 91 days. Felisa Bonier, RN, MSN   Recent Labs: Lab Results  Component Value Date   WBC 8.7 02/01/2020   HGB 13.3 02/01/2020   PLT 127.0 (L) 02/01/2020   NA 136 11/27/2019   K 4.7 11/27/2019   CL 101 11/27/2019   CO2 29 11/27/2019   GLUCOSE 76 11/27/2019   BUN 22 11/27/2019   CREATININE 1.40 11/27/2019   BILITOT 0.5 11/27/2019   ALKPHOS 94 11/27/2019   AST 41 (H) 11/27/2019   ALT 24 11/27/2019   PROT 7.5 11/27/2019   ALBUMIN 3.9 11/27/2019   CALCIUM 9.2 11/27/2019   GFRAA 58 (L) 05/20/2019    Speciality Comments: No specialty comments available.  Procedures:  No procedures performed Allergies: Lisinopril   Assessment / Plan:     Visit Diagnoses: No diagnosis found.  Orders: No orders of the defined types were placed in this  encounter.  No orders of the defined types were placed in this encounter.   Face-to-face time spent with patient was *** minutes. Greater than 50% of time was spent in counseling and coordination of care.  Follow-Up Instructions: No follow-ups on file.   Ofilia Neas, PA-C  Note - This record has been created using Dragon software.  Chart creation errors have been  sought, but may not always  have been located. Such creation errors do not reflect on  the standard of medical care.

## 2020-02-28 ENCOUNTER — Other Ambulatory Visit: Payer: Self-pay | Admitting: Adult Health

## 2020-02-29 NOTE — Progress Notes (Signed)
Office Visit Note  Patient: [name]             Date of Birth: 08/27/1931           MRN: [id number]             PCP: Caren Macadam, MD Referring: Caren Macadam, MD Visit Date: 03/02/2020 Occupation: @GUAROCC @  Subjective:  Medication monitoring   History of Present Illness: [name] is a 86 y.o. male with history of gout and osteoporosis.  He denies any recent gout flares.  He is taking allopurinol 100 mg 1 tablet daily for management of gout.   He is taking fosamax 70 mg 1 tablet by mouth once weekly for management of osteoporosis.  He is tolerating fosamax without any side effects.  He takes a calcium and vitamin D supplements.  He exercises 20-30 minutes daily and performs 2lb weight lifting.  He denies any falls or fractures.  He uses a cane to assist with ambulation.   Activities of Daily Living:  Patient reports morning stiffness for  0 minutes.   Patient Denies nocturnal pain.  Difficulty dressing/grooming: Denies Difficulty climbing stairs: Denies Difficulty getting out of chair: Denies Difficulty using hands for taps, buttons, cutlery, and/or writing: Denies  Review of Systems  Constitutional: Positive for fatigue. Negative for night sweats.  HENT: Positive for mouth dryness. Negative for mouth sores and nose dryness.   Eyes: Negative for redness and dryness.  Respiratory: Positive for cough. Negative for hemoptysis, shortness of breath and difficulty breathing.   Cardiovascular: Negative for chest pain, palpitations, hypertension, irregular heartbeat and swelling in legs/feet.  Gastrointestinal: Positive for constipation. Negative for blood in stool and diarrhea.  Endocrine: Negative for increased urination.  Genitourinary: Negative for painful urination.  Musculoskeletal: Negative for arthralgias, joint pain, joint swelling, myalgias, muscle weakness, morning stiffness, muscle tenderness and myalgias.  Skin: Negative for color change,  rash, hair loss, nodules/bumps, skin tightness, ulcers and sensitivity to sunlight.  Allergic/Immunologic: Negative for susceptible to infections.  Neurological: Negative for dizziness, fainting, memory loss, night sweats and weakness.  Hematological: Positive for bruising/bleeding tendency. Negative for swollen glands.  Psychiatric/Behavioral: Negative for depressed mood and sleep disturbance. The patient is not nervous/anxious.     PMFS History:  Patient Active Problem List   Diagnosis Date Noted  . Coronary artery disease involving native coronary artery of native heart without angina pectoris 09/30/2019  . Dyslipidemia 09/30/2019  . Educated about COVID-19 virus infection 03/20/2019  . VT (ventricular tachycardia) ([place]) 03/20/2019  . Chronic hypoxemic respiratory failure ([place]) 08/26/2018  . Gait abnormality 08/26/2018  . Lobar pneumonia, unspecified organism ([place]) 08/04/2018  . Lower GI bleeding 07/21/2018  . Forehead laceration, initial encounter 07/21/2018  . CKD (chronic kidney disease) stage 3, GFR 30-59 ml/min 07/21/2018  . History of pneumonia 07/15/2018  . Physical deconditioning 11/13/2017  . Weakness 11/12/2017  . Falls 11/12/2017  . Pleural effusion   . CHF exacerbation ([place]) 09/30/2017  . Shingles outbreak 05/27/2017  . Seborrheic dermatitis 05/27/2017  . Acute on chronic respiratory failure with hypoxia ([place]) 01/13/2017  . COPD with acute bronchitis ([place]) 01/13/2017  . CAD S/P percutaneous coronary angioplasty   . Chronic combined systolic and diastolic CHF (congestive heart failure) ([place])   . Blunt chest trauma 05/04/2016  . 4.1 cm Aneurysm of thoracic aorta ([place]) 04/27/2016  . SIADH (syndrome of inappropriate ADH production) ([place])   . Orthostatic hypotension 04/12/2016  . TBI (traumatic brain injury) ([place])   .  Acute on chronic combined systolic and diastolic congestive heart failure ([place])   . S/P ORIF (open reduction internal fixation) fracture   .  Thrombocytopenia ([place])   . Urinary retention   . Slow transit constipation   . Thyroid activity decreased   . Traumatic subdural hematoma with loss of consciousness ([place])   . Pulmonary hypertension ([place])   . Cardiomyopathy, ischemic   . Subdural hematoma ([place])   . Cardiac arrest ([place]) 03/29/2016  . 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 T wave over sensing resulting in inappropriate shocks 08/14/2013  . Vitamin D deficiency disease 04/09/2012  . Actinic skin damage 05/22/2011  . Alcohol use 03/22/2011  . Other dysphagia 02/16/2011  . Carotid arterial disease ([place]) 11/17/2009  . GOUT 09/23/2009  . Age-related osteoporosis with current pathological fracture 09/23/2009  . SYNCOPE 04/19/2009  . Automatic implantable cardioverter-defibrillator in situ 04/13/2008  . COLONIC POLYPS 12/09/2007  . Diverticulosis of colon 12/09/2007  . Essential hypertension 12/08/2007  . Peripheral vascular disease ([place]) 12/08/2007  . COPD GOLD III 12/08/2007  . Osteoarthritis 12/08/2007    Past Medical History:  Diagnosis Date  . AAA (abdominal aortic aneurysm) ([place])    a. 2002 s/p repair.  . Allergic rhinitis   . Back pain   . CAD (coronary artery disease)    a. 02/2002 H/o MI with stenting x 2; b. 04/2013 MV: EF 25%, large posterior lateral infarct w/o ischemia; c. 03/2016 VT Arrest/Cath: LM 60ost, LAD 40p/m ISR, LCX 100p/m, RCA nl-->Med Rx.  . Carotid arterial disease ([place])    a. 12/2010 s/p R CEA;  b. 05/2015 Carotid U/S: bilat <40% ICA stenosis.  . Chronic combined systolic and diastolic CHF (congestive heart failure) ([place])    a. 03/2016 Echo: Ef 35-40%, grade 1 DD.  Marland Kitchen Compression fracture   . COPD (chronic obstructive pulmonary disease) ([place])   . Dermatitis   . Diverticulosis of colon   . DJD (degenerative joint disease)    and Gout  . Hemorrhoids   . History of colonic polyps   . History of gout   . History of pneumonia   . Hypercholesterolemia   . Hypertension   .  Hypertensive heart disease   . Ischemic cardiomyopathy    a. EF prev <35%-->improved to normal by Echo 8/14:  Mild LVH, focal basal hypertrophy, EF 60-65%, normal wall motion, mild BAE, PASP 36; c. 03/2016 Echo: EF 35-40%, Gr1 DD, triv AI, mild MR, mod dil LA, mild-mod TR, PASP 68mmHg.  . Osteoporosis   . Peripheral vascular disease ([place])   . Presence of cardiac defibrillator    a. 03/2008 s/p MDT [id number] Maximo II DR, DC AICD; b. 03/2013: ICD shock for T wave oversensing;  c. 10/2015: collective decision not to replace ICD given improvement in LV fxn; d. VT Arrest-->Gen change to MDT ser # [id number].  . Skin cancer    shoulders and forehead  . Syncope   . T wave over sensing resulting in inappropriate shocks    a. 03/2013.  . Tobacco abuse   . Ventricular tachycardia ([place]) 04/01/2016    Family History  Problem Relation Age of Onset  . Hypertension Father   . Heart disease Father 27       Heart Disease before age 34  . Alcohol abuse Father        causing multiple medical problems; uncertain exact cause of death  . Hypertension Mother   . Heart disease Mother  Heart Disease before age 12  . Cancer Mother        pancreatic  . Scoliosis Sister   . Thyroid disease Daughter   . Anxiety disorder Son    Past Surgical History:  Procedure Laterality Date  . ABDOMINAL AORTIC ANEURYSM REPAIR  2002   by Dr. Kellie Simmering  . aicd placed  03/2008   Dr. Caryl Comes  . cad stent  02/2002   Dr. Percival Spanish  . CARDIAC CATHETERIZATION     X 2 stents  . CARDIAC CATHETERIZATION N/A 04/03/2016   Procedure: Left Heart Cath and Coronary Angiography;  Surgeon: Belva Crome, MD;  Location: [facility];  Service: Cardiovascular;  Laterality: N/A;  . CARDIAC DEFIBRILLATOR PLACEMENT  2009  . CARDIAC DEFIBRILLATOR PLACEMENT    . CAROTID ENDARTERECTOMY Right January 02, 2011  . EAR CYST EXCISION Left 12/13/2015   Procedure: Excision left ear lesion ;  Surgeon: Melissa Montane, MD;  Location: [facility];  Service: ENT;   Laterality: Left;  . EP IMPLANTABLE DEVICE N/A 04/06/2016   Procedure:  ICD Generator Changeout;  Surgeon: Deboraha Sprang, MD;  Location: [facility];  Service: Cardiovascular;  Laterality: N/A;  . EXCISION OF LESION LEFT EAR Left 12/13/2015  . EXTERNAL EAR SURGERY Left    Cancer Removal from left ear  . I & D EXTREMITY Left 04/23/2016   Procedure: IRRIGATION AND DEBRIDEMENT HAND;  Surgeon: Leandrew Koyanagi, MD;  Location: [facility];  Service: Orthopedics;  Laterality: Left;  . IR IMAGE GUIDED DRAINAGE BY PERCUTANEOUS CATHETER  10/08/2017  . lumbar back     done after compression fracture  . OPEN REDUCTION INTERNAL FIXATION (ORIF) METACARPAL Left 04/04/2016   Procedure: OPEN REDUCTION INTERNAL FIXATION (ORIF) LEFT 2ND, 3RD, 4TH METACARPAL FRACTURE;  Surgeon: Leandrew Koyanagi, MD;  Location: [facility];  Service: Orthopedics;  Laterality: Left;  OPEN REDUCTION INTERNAL FIXATION (ORIF) LEFT 2ND, 3RD, 4TH METACARPAL FRACTURE  . OPEN REDUCTION INTERNAL FIXATION (ORIF) METACARPAL Left 04/23/2016   Procedure: REVISION OPEN REDUCTION INTERNAL FIXATION (ORIF) 2ND METACARPAL;  Surgeon: Leandrew Koyanagi, MD;  Location: [facility];  Service: Orthopedics;  Laterality: Left;  . right corotid enderectomy  12/2010   Dr. Scot Dock  . SKIN SPLIT GRAFT Left 12/13/2015   Procedure: with possible skin graft;  Surgeon: Melissa Montane, MD;  Location: [facility];  Service: ENT;  Laterality: Left;  . TONSILLECTOMY     Social History   Social History Narrative   ** Merged History Encounter **       Lives locally.  Had been living with wife who had become quite ill recently and died on the evening of April 03, 2016.   Immunization History  Administered Date(s) Administered  . Fluad Quad(high Dose 65+) 07/31/2019  . H1N1 10/26/2008  . Influenza Split 08/23/2011, 07/15/2012  . Influenza Whole 07/14/2008, 08/22/2009, 09/04/2010  . Influenza, High Dose Seasonal PF 07/09/2016, 09/18/2017, 07/31/2018  . Influenza,inj,Quad PF,6+ Mos 07/07/2013, 07/07/2014,  07/23/2015  . Pneumococcal Conjugate-13 10/03/2015  . Pneumococcal Polysaccharide-23 07/12/2009  . Tdap 02/10/2018     Objective: Vital Signs: BP (!) 147/84 (BP Location: Left Arm, Patient Position: Sitting, Cuff Size: Large)   Pulse 68   Resp 13   Ht 5\' 11"  (1.803 m)   Wt 184 lb 6.4 oz (83.6 kg)   BMI 25.72 kg/m    Physical Exam Vitals and nursing note reviewed.  Constitutional:      Appearance: He is well-developed.  HENT:  Head: Normocephalic and atraumatic.  Eyes:     Conjunctiva/sclera: Conjunctivae normal.     Pupils: Pupils are equal, round, and reactive to light.  Pulmonary:     Effort: Pulmonary effort is normal.  Abdominal:     General: Bowel sounds are normal.     Palpations: Abdomen is soft.  Musculoskeletal:     Cervical back: Normal range of motion and neck supple.  Skin:    General: Skin is warm and dry.     Capillary Refill: Capillary refill takes less than 2 seconds.  Neurological:     Mental Status: He is alert and oriented to person, place, and time.  Psychiatric:        Behavior: Behavior normal.      Musculoskeletal Exam: C-spine good ROM.  Thoracic kyphosis noted.  No midline spinal tenderness.  Shoulder joints, elbow joints, MCPs, PIPs, and DIPs good ROM with no synovitis.  PIP and DIP thickening consistent with mid osteoarthritis of both hands. Knee joints good ROM with no discomfort. No warmth or effusion of knee joints.  Ankle joints have good range of motion with no discomfort  CDAI Exam: CDAI Score: -- Patient Global: --; Provider Global: -- Swollen: --; Tender: -- Joint Exam 03/02/2020   No joint exam has been documented for this visit   There is currently no information documented on the homunculus. Go to the Rheumatology activity and complete the homunculus joint exam.  Investigation: No additional findings.  Imaging: CUP PACEART REMOTE DEVICE CHECK  Result Date: 02/03/2020 Scheduled remote reviewed. Normal device function.   Next remote 91 days. Felisa Bonier, RN, MSN   Recent Labs: Lab Results  Component Value Date   WBC 8.7 02/01/2020   HGB 13.3 02/01/2020   PLT 127.0 (L) 02/01/2020   NA 136 11/27/2019   K 4.7 11/27/2019   CL 101 11/27/2019   CO2 29 11/27/2019   GLUCOSE 76 11/27/2019   BUN 22 11/27/2019   CREATININE 1.40 11/27/2019   BILITOT 0.5 11/27/2019   ALKPHOS 94 11/27/2019   AST 41 (H) 11/27/2019   ALT 24 11/27/2019   PROT 7.5 11/27/2019   ALBUMIN 3.9 11/27/2019   CALCIUM 9.2 11/27/2019   GFRAA 58 (L) 05/20/2019    Speciality Comments: No specialty comments available.  Procedures:  No procedures performed Allergies: Lisinopril   Assessment / Plan:     Visit Diagnoses: Age-related osteoporosis with current pathological fracture with routine healing, subsequent encounter: DEXA on 02/17/2018 right femoral neck T score -2.7 with an increase of 11.4% when compared to previous DEXA.  He is taking Fosamax 70 mg 1 tablet by mouth once weekly for management of osteoporosis.  He is tolerating Fosamax without any side effects.  He continues to take a calcium and vitamin D supplement as recommended.  He has not had any recent falls or fractures.  He uses a cane to assist with ambulation.  He has thoracic kyphosis but no midline spinal tenderness on exam today.  He is due to update his bone density.  We will reach out to his PCP to schedule an updated DEXA.  We will make further recommendations once we review the DEXA results.  He will follow-up in the office in 6 months.  Vitamin D deficiency: He is taking a vitamin D supplement on a daily basis.  Idiopathic chronic gout of multiple sites without tophus: He has not had any recent gout flares.  He is clinically doing well on allopurinol 100 mg 1 tablet by  mouth daily.  Uric acid was 6.1 on 11/27/19.  He will continue taking allopurinol as prescribed.    Other medical conditions are listed as follows:   History of COPD  Automatic implantable  cardioverter-defibrillator in situ  COPD GOLD III  History of diverticulosis  SIADH (syndrome of inappropriate ADH production) (HCC)  Peripheral vascular disease ([facility])  Basal cell carcinoma (BCC) of auricle of left ear  Closed compression fracture of L3 lumbar vertebra, sequela  Other medical conditions are listed as follows:   CAD S/P percutaneous coronary angioplasty  Cardiomyopathy, ischemic  Chronic combined systolic and diastolic CHF (congestive heart failure) ([place])  Pulmonary hypertension ([place])  Essential hypertension  History of subdural hematoma  History of traumatic brain injury  Former smoker  Orders: No orders of the defined types were placed in this encounter.  No orders of the defined types were placed in this encounter.   Follow-Up Instructions: Return in about 6 months (around 09/02/2020) for Osteoporosis, Gout.   Hazel Sams, PA-C  I examined and evaluated the patient with Hazel Sams PA.  Patient has been tolerating Fosamax well.  We will schedule DEXA to evaluate his bone density and plan future treatment.  The plan of care was discussed as noted above.  Bo Merino, MD    Note - This record has been created using Editor, commissioning.  Chart creation errors have been sought, but may not always  have been located. Such creation errors do not reflect on  the standard of medical care.

## 2020-03-01 ENCOUNTER — Ambulatory Visit: Payer: Medicare Other | Admitting: Rheumatology

## 2020-03-02 ENCOUNTER — Encounter: Payer: Self-pay | Admitting: Rheumatology

## 2020-03-02 ENCOUNTER — Other Ambulatory Visit: Payer: Self-pay | Admitting: Family Medicine

## 2020-03-02 ENCOUNTER — Other Ambulatory Visit: Payer: Self-pay

## 2020-03-02 ENCOUNTER — Ambulatory Visit (INDEPENDENT_AMBULATORY_CARE_PROVIDER_SITE_OTHER): Payer: Medicare Other | Admitting: Rheumatology

## 2020-03-02 VITALS — BP 147/84 | HR 68 | Resp 13 | Ht 71.0 in | Wt 184.4 lb

## 2020-03-02 DIAGNOSIS — S32030S Wedge compression fracture of third lumbar vertebra, sequela: Secondary | ICD-10-CM | POA: Diagnosis not present

## 2020-03-02 DIAGNOSIS — M810 Age-related osteoporosis without current pathological fracture: Secondary | ICD-10-CM

## 2020-03-02 DIAGNOSIS — I255 Ischemic cardiomyopathy: Secondary | ICD-10-CM

## 2020-03-02 DIAGNOSIS — Z95810 Presence of automatic (implantable) cardiac defibrillator: Secondary | ICD-10-CM | POA: Diagnosis not present

## 2020-03-02 DIAGNOSIS — Z8679 Personal history of other diseases of the circulatory system: Secondary | ICD-10-CM

## 2020-03-02 DIAGNOSIS — Z9861 Coronary angioplasty status: Secondary | ICD-10-CM

## 2020-03-02 DIAGNOSIS — Z87891 Personal history of nicotine dependence: Secondary | ICD-10-CM

## 2020-03-02 DIAGNOSIS — Z87820 Personal history of traumatic brain injury: Secondary | ICD-10-CM

## 2020-03-02 DIAGNOSIS — M8000XD Age-related osteoporosis with current pathological fracture, unspecified site, subsequent encounter for fracture with routine healing: Secondary | ICD-10-CM

## 2020-03-02 DIAGNOSIS — J449 Chronic obstructive pulmonary disease, unspecified: Secondary | ICD-10-CM | POA: Diagnosis not present

## 2020-03-02 DIAGNOSIS — Z8709 Personal history of other diseases of the respiratory system: Secondary | ICD-10-CM | POA: Diagnosis not present

## 2020-03-02 DIAGNOSIS — M1A09X0 Idiopathic chronic gout, multiple sites, without tophus (tophi): Secondary | ICD-10-CM

## 2020-03-02 DIAGNOSIS — C44219 Basal cell carcinoma of skin of left ear and external auricular canal: Secondary | ICD-10-CM

## 2020-03-02 DIAGNOSIS — I2510 Atherosclerotic heart disease of native coronary artery without angina pectoris: Secondary | ICD-10-CM

## 2020-03-02 DIAGNOSIS — Z8719 Personal history of other diseases of the digestive system: Secondary | ICD-10-CM

## 2020-03-02 DIAGNOSIS — E222 Syndrome of inappropriate secretion of antidiuretic hormone: Secondary | ICD-10-CM

## 2020-03-02 DIAGNOSIS — E559 Vitamin D deficiency, unspecified: Secondary | ICD-10-CM

## 2020-03-02 DIAGNOSIS — I739 Peripheral vascular disease, unspecified: Secondary | ICD-10-CM

## 2020-03-02 DIAGNOSIS — I272 Pulmonary hypertension, unspecified: Secondary | ICD-10-CM

## 2020-03-02 DIAGNOSIS — I5042 Chronic combined systolic (congestive) and diastolic (congestive) heart failure: Secondary | ICD-10-CM

## 2020-03-02 DIAGNOSIS — I10 Essential (primary) hypertension: Secondary | ICD-10-CM

## 2020-03-06 ENCOUNTER — Other Ambulatory Visit: Payer: Self-pay | Admitting: Cardiology

## 2020-03-06 ENCOUNTER — Other Ambulatory Visit: Payer: Self-pay | Admitting: Rheumatology

## 2020-03-07 ENCOUNTER — Ambulatory Visit (INDEPENDENT_AMBULATORY_CARE_PROVIDER_SITE_OTHER): Payer: Medicare Other

## 2020-03-07 ENCOUNTER — Other Ambulatory Visit: Payer: Self-pay | Admitting: Rheumatology

## 2020-03-07 DIAGNOSIS — Z95810 Presence of automatic (implantable) cardiac defibrillator: Secondary | ICD-10-CM

## 2020-03-07 DIAGNOSIS — I5042 Chronic combined systolic (congestive) and diastolic (congestive) heart failure: Secondary | ICD-10-CM

## 2020-03-08 NOTE — Progress Notes (Signed)
EPIC Encounter for ICM Monitoring  Patient Name: [name] is a 86 y.o. male Date: 03/08/2020 Primary Care Physican: Jesse Macadam, MD Primary Cardiologist: [name] Electrophysiologist: [name] Weight:unknown  Attempted call to daughter [name] and unable to reach.  Left detailed message per DPR regarding transmission. Transmission reviewed.   Optivol thoracic impedancenormal.  Prescribed: Furosemide20 mg 1 tabletdaily.Potassium 10 mEq 1 tabletdaily.   Labs: 05/20/2019 Creatinine 1.28, BUN 21, Potassium 4.9, Sodium 136, GFR 50-58 A complete set of results can be found in Results Review.  Recommendations:Left voice mail with ICM number and encouraged to call if experiencing any fluid symptoms.  Follow-up plan: ICM clinic phone appointment on 04/11/2020.91 day device clinic remote transmission 05/04/2020.   Copy of ICM check sent to [facility].  3 month ICM trend: 03/07/2020    1 Year ICM trend:       Jesse Billings, RN 03/08/2020 1:30 PM

## 2020-03-09 ENCOUNTER — Other Ambulatory Visit: Payer: Self-pay | Admitting: Rheumatology

## 2020-03-09 NOTE — Telephone Encounter (Signed)
Last Visit: 03/02/2020 Next Visit: 08/31/2020 Labs: 02/01/2020 CBC Platelets are low. He has a history of thrombocytopenia.  Relative and absolute eosinophils are also elevated.   Okay to refill fosamax?

## 2020-03-09 NOTE — Telephone Encounter (Signed)
Ok to refill fosamax.

## 2020-03-15 DIAGNOSIS — X32XXXD Exposure to sunlight, subsequent encounter: Secondary | ICD-10-CM | POA: Diagnosis not present

## 2020-03-15 DIAGNOSIS — D0439 Carcinoma in situ of skin of other parts of face: Secondary | ICD-10-CM | POA: Diagnosis not present

## 2020-03-15 DIAGNOSIS — L570 Actinic keratosis: Secondary | ICD-10-CM | POA: Diagnosis not present

## 2020-03-15 DIAGNOSIS — Z85828 Personal history of other malignant neoplasm of skin: Secondary | ICD-10-CM | POA: Diagnosis not present

## 2020-03-15 DIAGNOSIS — D0462 Carcinoma in situ of skin of left upper limb, including shoulder: Secondary | ICD-10-CM | POA: Diagnosis not present

## 2020-03-15 DIAGNOSIS — Z08 Encounter for follow-up examination after completed treatment for malignant neoplasm: Secondary | ICD-10-CM | POA: Diagnosis not present

## 2020-04-03 ENCOUNTER — Other Ambulatory Visit: Payer: Self-pay | Admitting: Adult Health

## 2020-04-11 ENCOUNTER — Ambulatory Visit (INDEPENDENT_AMBULATORY_CARE_PROVIDER_SITE_OTHER): Payer: Medicare Other

## 2020-04-11 DIAGNOSIS — I5042 Chronic combined systolic (congestive) and diastolic (congestive) heart failure: Secondary | ICD-10-CM | POA: Diagnosis not present

## 2020-04-11 DIAGNOSIS — Z95810 Presence of automatic (implantable) cardiac defibrillator: Secondary | ICD-10-CM | POA: Diagnosis not present

## 2020-04-13 NOTE — Progress Notes (Signed)
EPIC Encounter for ICM Monitoring  Patient Name: [name] is a 86 y.o. male Date: 04/13/2020 Primary Care Physican: Caren Macadam, MD Primary Cardiologist: [place] Electrophysiologist: Faustino Congress Weight:unknown  Transmission reviewed.   Optivol thoracic impedancenormal.  Prescribed: Furosemide20 mg 1 tabletdaily.Potassium 10 mEq 1 tabletdaily.   Labs: 05/20/2019 Creatinine 1.28, BUN 21, Potassium 4.9, Sodium 136, GFR 50-58 A complete set of results can be found in Results Review.  Recommendations: None  Follow-up plan: ICM clinic phone appointment on 05/16/2020.91 day device clinic remote transmission7/14/2021.   Copy of ICM check sent to [facility].  3 month ICM trend: 04/11/2020    1 Year ICM trend:       [name], RN 04/13/2020 11:15 AM

## 2020-05-01 ENCOUNTER — Other Ambulatory Visit: Payer: Self-pay | Admitting: Cardiology

## 2020-05-04 ENCOUNTER — Ambulatory Visit (INDEPENDENT_AMBULATORY_CARE_PROVIDER_SITE_OTHER): Payer: Medicare Other | Admitting: *Deleted

## 2020-05-04 DIAGNOSIS — I255 Ischemic cardiomyopathy: Secondary | ICD-10-CM

## 2020-05-04 LAB — CUP PACEART REMOTE DEVICE CHECK
Battery Remaining Longevity: 54 mo
Battery Voltage: 2.98 V
Brady Statistic AP VP Percent: 0.04 %
Brady Statistic AP VS Percent: 99.17 %
Brady Statistic AS VP Percent: 0 %
Brady Statistic AS VS Percent: 0.78 %
Brady Statistic RA Percent Paced: 99.21 %
Brady Statistic RV Percent Paced: 0.04 %
Date Time Interrogation Session: 20210714033524
HighPow Impedance: 44 Ohm
HighPow Impedance: 57 Ohm
Implantable Lead Implant Date: 20090623
Implantable Lead Implant Date: 20090623
Implantable Lead Location: 753859
Implantable Lead Location: 753860
Implantable Lead Model: 5076
Implantable Lead Model: 6947
Implantable Pulse Generator Implant Date: 20170616
Lead Channel Impedance Value: 285 Ohm
Lead Channel Impedance Value: 342 Ohm
Lead Channel Impedance Value: 399 Ohm
Lead Channel Pacing Threshold Amplitude: 0.875 V
Lead Channel Pacing Threshold Amplitude: 1 V
Lead Channel Pacing Threshold Pulse Width: 0.4 ms
Lead Channel Pacing Threshold Pulse Width: 0.4 ms
Lead Channel Sensing Intrinsic Amplitude: 0.75 mV
Lead Channel Sensing Intrinsic Amplitude: 0.75 mV
Lead Channel Sensing Intrinsic Amplitude: 8.5 mV
Lead Channel Sensing Intrinsic Amplitude: 8.5 mV
Lead Channel Setting Pacing Amplitude: 2.25 V
Lead Channel Setting Pacing Amplitude: 2.5 V
Lead Channel Setting Pacing Pulse Width: 0.4 ms
Lead Channel Setting Sensing Sensitivity: 0.6 mV

## 2020-05-04 NOTE — Addendum Note (Signed)
Addended by: Marrion Coy on: 05/04/2020 05:07 PM   Modules accepted: Orders

## 2020-05-05 NOTE — Progress Notes (Signed)
Remote ICD transmission.   

## 2020-05-06 ENCOUNTER — Other Ambulatory Visit: Payer: Medicare Other

## 2020-05-09 ENCOUNTER — Other Ambulatory Visit: Payer: Self-pay

## 2020-05-09 DIAGNOSIS — E039 Hypothyroidism, unspecified: Secondary | ICD-10-CM

## 2020-05-09 DIAGNOSIS — D7219 Other eosinophilia: Secondary | ICD-10-CM

## 2020-05-11 ENCOUNTER — Other Ambulatory Visit: Payer: Self-pay

## 2020-05-11 ENCOUNTER — Other Ambulatory Visit: Payer: Medicare Other

## 2020-05-11 DIAGNOSIS — D7219 Other eosinophilia: Secondary | ICD-10-CM | POA: Diagnosis not present

## 2020-05-11 DIAGNOSIS — E039 Hypothyroidism, unspecified: Secondary | ICD-10-CM | POA: Diagnosis not present

## 2020-05-12 ENCOUNTER — Other Ambulatory Visit: Payer: Self-pay | Admitting: Family Medicine

## 2020-05-12 LAB — CBC WITH DIFFERENTIAL/PLATELET
Absolute Monocytes: 583 cells/uL (ref 200–950)
Basophils Absolute: 47 cells/uL (ref 0–200)
Basophils Relative: 0.5 %
Eosinophils Absolute: 2171 cells/uL — ABNORMAL HIGH (ref 15–500)
Eosinophils Relative: 23.1 %
HCT: 41.2 % (ref 38.5–50.0)
Hemoglobin: 13.4 g/dL (ref 13.2–17.1)
Lymphs Abs: 1513 cells/uL (ref 850–3900)
MCH: 32 pg (ref 27.0–33.0)
MCHC: 32.5 g/dL (ref 32.0–36.0)
MCV: 98.3 fL (ref 80.0–100.0)
MPV: 11 fL (ref 7.5–12.5)
Monocytes Relative: 6.2 %
Neutro Abs: 5085 cells/uL (ref 1500–7800)
Neutrophils Relative %: 54.1 %
Platelets: 115 10*3/uL — ABNORMAL LOW (ref 140–400)
RBC: 4.19 10*6/uL — ABNORMAL LOW (ref 4.20–5.80)
RDW: 14.1 % (ref 11.0–15.0)
Total Lymphocyte: 16.1 %
WBC: 9.4 10*3/uL (ref 3.8–10.8)

## 2020-05-12 LAB — TSH: TSH: 1.89 mIU/L (ref 0.40–4.50)

## 2020-05-16 ENCOUNTER — Ambulatory Visit (INDEPENDENT_AMBULATORY_CARE_PROVIDER_SITE_OTHER): Payer: Medicare Other

## 2020-05-16 ENCOUNTER — Telehealth: Payer: Self-pay

## 2020-05-16 ENCOUNTER — Encounter: Payer: Self-pay | Admitting: Family Medicine

## 2020-05-16 DIAGNOSIS — Z95810 Presence of automatic (implantable) cardiac defibrillator: Secondary | ICD-10-CM

## 2020-05-16 DIAGNOSIS — I5042 Chronic combined systolic (congestive) and diastolic (congestive) heart failure: Secondary | ICD-10-CM | POA: Diagnosis not present

## 2020-05-16 NOTE — Progress Notes (Signed)
EPIC Encounter for ICM Monitoring  Patient Name: [name] is a 86 y.o. male Date: 05/16/2020 Primary Care Physican: Caren Macadam, MD Primary Cardiologist: [place] Electrophysiologist: Faustino Congress Weight:unknown  Attempted call to [name], daughter, DPR and unable to reach.  Left message to return call regarding transmission. Transmission reviewed.   Optivol thoracic impedancesuggests possible fluid accumulation since 05/07/2020.  Prescribed:  Furosemide20 mg 1 tabletdaily. Potassium 10 mEq 1 tabletdaily.   Labs: 11/27/2019 Creatinine 1.40, BUN 22, Potassium 4.7, Sodium 136, GFR 47.97 05/20/2019 Creatinine 1.28, BUN 21, Potassium 4.9, Sodium 136 A complete set of results can be found in Results Review.  Recommendations: Unable to reach.    Follow-up plan: ICM clinic phone appointment on 05/25/2020 to recheck fluid levels.   91 day device clinic remote transmission 08/03/2020.    EP/Cardiology Office Visits: Recall for 05/19/2020 with Dr. Caryl Comes (daughter was informed by device clinic nurse to call for appointment).    Copy of ICM check sent to Dr. Caryl Comes and Dr Percival Spanish for review and recommendations if needed.   3 month ICM trend: 05/16/2020    1 Year ICM trend:       [name], RN 05/16/2020 11:01 AM

## 2020-05-16 NOTE — Telephone Encounter (Signed)
Remote ICM transmission received.  Attempted call to daughter, [name] per [facility] regarding ICM remote transmission and left detailed message per to return call.

## 2020-05-25 ENCOUNTER — Ambulatory Visit (INDEPENDENT_AMBULATORY_CARE_PROVIDER_SITE_OTHER): Payer: Medicare Other

## 2020-05-25 ENCOUNTER — Telehealth: Payer: Self-pay

## 2020-05-25 DIAGNOSIS — I5042 Chronic combined systolic (congestive) and diastolic (congestive) heart failure: Secondary | ICD-10-CM

## 2020-05-25 DIAGNOSIS — Z95810 Presence of automatic (implantable) cardiac defibrillator: Secondary | ICD-10-CM

## 2020-05-25 NOTE — Telephone Encounter (Signed)
Remote ICM transmission received.  Attempted call to daughter, [name], [place],  regarding ICM remote transmission and left detailed message per DPR.  Advised to return call for any fluid symptoms or questions.

## 2020-05-25 NOTE — Progress Notes (Signed)
EPIC Encounter for ICM Monitoring  Patient Name: [name] is a 86 y.o. male Date: 05/25/2020 Primary Care Physican: Caren Macadam, MD Primary Cardiologist: [place] Electrophysiologist: Faustino Congress Weight:unknown  Attempted call to [name], daughter, DPR and unable to reach.  Left message to return call regarding transmission. Transmission reviewed.   Optivol thoracic impedancereturned close to baseline normal.  Prescribed:  Furosemide20 mg 1 tabletdaily. Potassium 10 mEq 1 tabletdaily.   Labs: 11/27/2019 Creatinine 1.40, BUN 22, Potassium 4.7, Sodium 136, GFR 47.97 05/20/2019 Creatinine 1.28, BUN 21, Potassium 4.9, Sodium 136 A complete set of results can be found in Results Review.  Recommendations: Unable to reach.    Follow-up plan: ICM clinic phone appointment on 06/20/2020.   91 day device clinic remote transmission 08/03/2020.    EP/Cardiology Office Visits: Recall for 05/19/2020 with Dr. Caryl Comes (daughter was informed by device clinic nurse to call for appointment).    Copy of ICM check sent to Dr. Caryl Comes and Dr Percival Spanish.   3 month ICM trend: 05/25/2020    1 Year ICM trend:       [name], RN 05/25/2020 1:02 PM

## 2020-05-29 ENCOUNTER — Other Ambulatory Visit: Payer: Self-pay | Admitting: Physician Assistant

## 2020-05-29 ENCOUNTER — Other Ambulatory Visit: Payer: Self-pay | Admitting: Internal Medicine

## 2020-05-29 ENCOUNTER — Other Ambulatory Visit: Payer: Self-pay | Admitting: Adult Health

## 2020-05-29 ENCOUNTER — Other Ambulatory Visit: Payer: Self-pay | Admitting: Family Medicine

## 2020-05-29 ENCOUNTER — Other Ambulatory Visit: Payer: Self-pay | Admitting: Cardiology

## 2020-05-30 NOTE — Telephone Encounter (Signed)
Last Visit: 03/02/2020 Next Visit: 08/31/2020 Labs: 05/11/2020 Eosinophils Absolute 2.171, Platelets 115, RBC 4.19  Okay to refill Fosamax?

## 2020-06-12 ENCOUNTER — Other Ambulatory Visit: Payer: Self-pay | Admitting: Cardiology
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
## 2020-06-15 ENCOUNTER — Ambulatory Visit (INDEPENDENT_AMBULATORY_CARE_PROVIDER_SITE_OTHER): Payer: Medicare Other

## 2020-06-15 ENCOUNTER — Other Ambulatory Visit: Payer: Self-pay

## 2020-06-15 ENCOUNTER — Ambulatory Visit (INDEPENDENT_AMBULATORY_CARE_PROVIDER_SITE_OTHER): Payer: Medicare Other | Admitting: Cardiology

## 2020-06-15 ENCOUNTER — Encounter: Payer: Self-pay | Admitting: Cardiology

## 2020-06-15 VITALS — BP 120/70 | HR 77 | Ht 71.0 in | Wt 181.8 lb

## 2020-06-15 DIAGNOSIS — I472 Ventricular tachycardia, unspecified: Secondary | ICD-10-CM

## 2020-06-15 DIAGNOSIS — I10 Essential (primary) hypertension: Secondary | ICD-10-CM

## 2020-06-15 DIAGNOSIS — E785 Hyperlipidemia, unspecified: Secondary | ICD-10-CM | POA: Diagnosis not present

## 2020-06-15 DIAGNOSIS — Z9861 Coronary angioplasty status: Secondary | ICD-10-CM | POA: Diagnosis not present

## 2020-06-15 DIAGNOSIS — I5022 Chronic systolic (congestive) heart failure: Secondary | ICD-10-CM

## 2020-06-15 DIAGNOSIS — Z79899 Other long term (current) drug therapy: Secondary | ICD-10-CM

## 2020-06-15 DIAGNOSIS — I255 Ischemic cardiomyopathy: Secondary | ICD-10-CM

## 2020-06-15 DIAGNOSIS — Z0000 Encounter for general adult medical examination without abnormal findings: Secondary | ICD-10-CM | POA: Diagnosis not present

## 2020-06-15 DIAGNOSIS — Z7189 Other specified counseling: Secondary | ICD-10-CM

## 2020-06-15 DIAGNOSIS — I2510 Atherosclerotic heart disease of native coronary artery without angina pectoris: Secondary | ICD-10-CM | POA: Diagnosis not present

## 2020-06-15 NOTE — Progress Notes (Addendum)
Virtual Visit via Telephone Note  I connected with  Prosper Paff on 06/15/20 at  8:45 AM EDT by telephone and verified that I am speaking with the correct person using two identifiers.  Medicare Annual Wellness visit completed telephonically due to Covid-19 pandemic.   Persons participating in this call: This Health Coach and this patient.   Location: Patient: Home Provider: Office   I discussed the limitations, risks, security and privacy concerns of performing an evaluation and management service by telephone and the availability of in person appointments. The patient expressed understanding and agreed to proceed.  Unable to perform video visit due to video visit attempted and failed and/or patient does not have video capability.   Some vital signs may be absent or patient reported.   Willette Brace, LPN    Subjective:   Bernabe Dorce is a 86 y.o. male who presents for Medicare Annual/Subsequent preventive examination.  Review of Systems     Cardiac Risk Factors include: hypertension;dyslipidemia;male gender     Objective:    There were no vitals filed for this visit. There is no height or weight on file to calculate BMI.  Advanced Directives 06/15/2020 07/21/2018 02/10/2018 09/30/2017 09/30/2017 09/30/2017 05/23/2017  Does Patient Have a Medical Advance Directive? Yes No Yes Yes Yes Yes Yes  Type of Product manager Power of [employer] Power of [employer] Power of [place];Living will [place];Living will  Does patient want to make changes to medical advance directive? - - - No - Patient declined No - Patient declined - -  Copy of [place] in Chart? Yes - validated most recent copy scanned in chart (See row information) - No - copy requested No - copy requested No - copy requested - No - copy requested  Would patient like information on  creating a medical advance directive? - No - Patient declined - - - - -    Current Medications (verified) Outpatient Encounter Medications as of 06/15/2020  Medication Sig  . albuterol (PROVENTIL HFA;VENTOLIN HFA) 108 (90 Base) MCG/ACT inhaler Inhale 2 puffs into the lungs every 6 (six) hours as needed for wheezing or shortness of breath.  Marland Kitchen alendronate (FOSAMAX) 70 MG tablet TAKE 1 TAB ONCE A WEEK, AT LEAST 30 MIN BEFORE 1ST FOOD.DO NOT LIE DOWN FOR 30 MIN AFTER TAKING.  Marland Kitchen allopurinol (ZYLOPRIM) 100 MG tablet TAKE 1 TABLET ONCE DAILY.  Marland Kitchen amiodarone (PACERONE) 200 MG tablet TAKE 1 TABLET (200MG ) BY MOUTH 5 DAYS PER WEEK.  Marland Kitchen aspirin EC 81 MG tablet Take 81 mg by mouth daily.  . budesonide (PULMICORT) 0.5 MG/2ML nebulizer solution Take 2 mLs (0.5 mg total) by nebulization 2 (two) times daily.  . carvedilol (COREG) 3.125 MG tablet TAKE 1 TABLET BY MOUTH TWICE DAILY WITH A MEAL.  . formoterol (PERFOROMIST) 20 MCG/2ML nebulizer solution Take 2 mLs (20 mcg total) by nebulization 2 (two) times daily.  . furosemide (LASIX) 20 MG tablet Take 1 tablet (20 mg total) by mouth daily. Please keep upcoming appt in October with Dr. Caryl Comes before anymore refills. Thank you  . guaiFENesin (MUCINEX) 600 MG 12 hr tablet Take 600 mg by mouth 2 (two) times daily.   Marland Kitchen levothyroxine (SYNTHROID) 125 MCG tablet Take 1 tablet (125 mcg total) by mouth daily.  . midodrine (PROAMATINE) 2.5 MG tablet TAKE 1 TABLET 3 TIMES DAILY WITH MEALS.  . Multiple Vitamins-Minerals (PRESERVISION AREDS 2  PO) Take 1 capsule by mouth daily.  Marland Kitchen oxybutynin (DITROPAN) 5 MG tablet TAKE 1 TABLET BY MOUTH TWICE DAILY.  Marland Kitchen polyethylene glycol (MIRALAX / GLYCOLAX) 17 g packet Take 17 g by mouth daily.  . potassium chloride (KLOR-CON) 10 MEQ tablet TAKE 1 TABLET ONCE DAILY.  . pravastatin (PRAVACHOL) 40 MG tablet TAKE 1 TABLET ONCE DAILY.  Marland Kitchen Revefenacin (YUPELRI) 175 MCG/3ML SOLN Inhale 3 mLs into the lungs daily.  Marland Kitchen thiamine 100 MG tablet Take 1  tablet (100 mg total) by mouth daily.  . cholecalciferol (VITAMIN D) 1000 units tablet Take 1,000 Units by mouth daily. (Patient not taking: Reported on 06/15/2020)  . fluticasone (CUTIVATE) 0.05 % cream Apply 1 application topically 2 (two) times daily.  (Patient not taking: Reported on 06/15/2020)  . magnesium oxide (MAG-OX) 400 (241.3 Mg) MG tablet Take 0.5 tablets (200 mg total) by mouth daily. (Patient not taking: Reported on 06/15/2020)  . sertraline (ZOLOFT) 50 MG tablet TAKE ONE TABLET AT BEDTIME. (Patient not taking: Reported on 06/15/2020)  . [DISCONTINUED] polyethylene glycol powder (QC NATURA-LAX) powder 1 capful in 8oz water twice daily (Patient taking differently: 1 capful in [place] water as needed)   No facility-administered encounter medications on file as of 06/15/2020.    Allergies (verified) Lisinopril   History: Past Medical History:  Diagnosis Date  . AAA (abdominal aortic aneurysm) ([place])    a. 2002 s/p repair.  . Allergic rhinitis   . Back pain   . CAD (coronary artery disease)    a. 02/2002 H/o MI with stenting x 2; b. 04/2013 MV: EF 25%, large posterior lateral infarct w/o ischemia; c. 03/2016 VT Arrest/Cath: LM 60ost, LAD 40p/m ISR, LCX 100p/m, RCA nl-->Med Rx.  . Carotid arterial disease ([place])    a. 12/2010 s/p R CEA;  b. 05/2015 Carotid U/S: bilat <40% ICA stenosis.  . Chronic combined systolic and diastolic CHF (congestive heart failure) ([place])    a. 03/2016 Echo: Ef 35-40%, grade 1 DD.  Marland Kitchen Compression fracture   . COPD (chronic obstructive pulmonary disease) ([place])   . Dermatitis   . Diverticulosis of colon   . DJD (degenerative joint disease)    and Gout  . Hemorrhoids   . History of colonic polyps   . History of gout   . History of pneumonia   . Hypercholesterolemia   . Hypertension   . Hypertensive heart disease   . Ischemic cardiomyopathy    a. EF prev <35%-->improved to normal by Echo 8/14:  Mild LVH, focal basal hypertrophy, EF 60-65%, normal wall motion,  mild BAE, PASP 36; c. 03/2016 Echo: EF 35-40%, Gr1 DD, triv AI, mild MR, mod dil LA, mild-mod TR, PASP 1mmHg.  . Osteoporosis   . Peripheral vascular disease ([place])   . Presence of cardiac defibrillator    a. 03/2008 s/p MDT [id number] Maximo II DR, DC AICD; b. 03/2013: ICD shock for T wave oversensing;  c. 10/2015: collective decision not to replace ICD given improvement in LV fxn; d. VT Arrest-->Gen change to MDT ser # [id number].  . Skin cancer    shoulders and forehead  . Syncope   . T wave over sensing resulting in inappropriate shocks    a. 03/2013.  . Tobacco abuse   . Ventricular tachycardia ([place]) 04/01/2016   Past Surgical History:  Procedure Laterality Date  . ABDOMINAL AORTIC ANEURYSM REPAIR  2002   by Dr. Kellie Simmering  . aicd placed  03/2008   Dr. Caryl Comes  . cad stent  02/2002   Dr. Percival Spanish  . CARDIAC CATHETERIZATION     X 2 stents  . CARDIAC CATHETERIZATION N/A 04/03/2016   Procedure: Left Heart Cath and Coronary Angiography;  Surgeon: Belva Crome, MD;  Location: [facility];  Service: Cardiovascular;  Laterality: N/A;  . CARDIAC DEFIBRILLATOR PLACEMENT  2009  . CARDIAC DEFIBRILLATOR PLACEMENT    . CAROTID ENDARTERECTOMY Right January 02, 2011  . EAR CYST EXCISION Left 12/13/2015   Procedure: Excision left ear lesion ;  Surgeon: Melissa Montane, MD;  Location: [facility];  Service: ENT;  Laterality: Left;  . EP IMPLANTABLE DEVICE N/A 04/06/2016   Procedure:  ICD Generator Changeout;  Surgeon: Deboraha Sprang, MD;  Location: [facility];  Service: Cardiovascular;  Laterality: N/A;  . EXCISION OF LESION LEFT EAR Left 12/13/2015  . EXTERNAL EAR SURGERY Left    Cancer Removal from left ear  . I & D EXTREMITY Left 04/23/2016   Procedure: IRRIGATION AND DEBRIDEMENT HAND;  Surgeon: Leandrew Koyanagi, MD;  Location: [facility];  Service: Orthopedics;  Laterality: Left;  . IR IMAGE GUIDED DRAINAGE BY PERCUTANEOUS CATHETER  10/08/2017  . lumbar back     done after compression fracture  . OPEN REDUCTION  INTERNAL FIXATION (ORIF) METACARPAL Left 04/04/2016   Procedure: OPEN REDUCTION INTERNAL FIXATION (ORIF) LEFT 2ND, 3RD, 4TH METACARPAL FRACTURE;  Surgeon: Leandrew Koyanagi, MD;  Location: [facility];  Service: Orthopedics;  Laterality: Left;  OPEN REDUCTION INTERNAL FIXATION (ORIF) LEFT 2ND, 3RD, 4TH METACARPAL FRACTURE  . OPEN REDUCTION INTERNAL FIXATION (ORIF) METACARPAL Left 04/23/2016   Procedure: REVISION OPEN REDUCTION INTERNAL FIXATION (ORIF) 2ND METACARPAL;  Surgeon: Leandrew Koyanagi, MD;  Location: [facility];  Service: Orthopedics;  Laterality: Left;  . right corotid enderectomy  12/2010   Dr. Scot Dock  . SKIN SPLIT GRAFT Left 12/13/2015   Procedure: with possible skin graft;  Surgeon: Melissa Montane, MD;  Location: [facility];  Service: ENT;  Laterality: Left;  . TONSILLECTOMY     Family History  Problem Relation Age of Onset  . Hypertension Father   . Heart disease Father 24       Heart Disease before age 83  . Alcohol abuse Father        causing multiple medical problems; uncertain exact cause of death  . Hypertension Mother   . Heart disease Mother        Heart Disease before age 16  . Cancer Mother        pancreatic  . Scoliosis Sister   . Thyroid disease Daughter   . Anxiety disorder Son    Social History   Socioeconomic History  . Marital status: Widowed    Spouse name: Not on file  . Number of children: 3  . Years of education: Not on file  . Highest education level: Not on file  Occupational History  . Occupation: laywer    Comment: retired  Tobacco Use  . Smoking status: Former Smoker    Packs/day: 1.50    Years: 65.00    Pack years: 97.50    Types: Cigarettes, Cigars, Pipe    Quit date: 03/13/2008    Years since quitting: 12.2  . Smokeless tobacco: Never Used  . Tobacco comment: quit about 10 years ago  Vaping Use  . Vaping Use: Never used  Substance and Sexual Activity  . Alcohol use: Yes    Alcohol/week: 14.0 standard drinks    Types: 7 Glasses of wine, 7 Cans  of beer per  week    Comment: h/o heavy use, but "quit" drinking and now only drinks 1-3 daily  . Drug use: Never  . Sexual activity: Not on file  Other Topics Concern  . Not on file  Social History Narrative   ** Merged History Encounter **       Lives locally.  Had been living with wife who had become quite ill recently and died on the evening of April 23, 2016.   Social Determinants of Health   Financial Resource Strain: Low Risk   . Difficulty of Paying Living Expenses: Not hard at all  Food Insecurity: No Food Insecurity  . Worried About Charity fundraiser in the Last Year: Never true  . Ran Out of Food in the Last Year: Never true  Transportation Needs: No Transportation Needs  . Lack of Transportation (Medical): No  . Lack of Transportation (Non-Medical): No  Physical Activity: Sufficiently Active  . Days of Exercise per Week: 5 days  . Minutes of Exercise per Session: 30 min  Stress: No Stress Concern Present  . Feeling of Stress : Not at all  Social Connections: Socially Isolated  . Frequency of Communication with Friends and Family: More than three times a week  . Frequency of Social Gatherings with Friends and Family: Twice a week  . Attends Religious Services: Never  . Active Member of Clubs or Organizations: No  . Attends Archivist Meetings: Never  . Marital Status: Widowed    Tobacco Counseling Counseling given: Not Answered Comment: quit about 10 years ago   Clinical Intake:  Pre-visit preparation completed: Yes  Pain : No/denies pain     BMI - recorded: 25.73 Nutritional Status: BMI 25 -29 Overweight Diabetes: No  How often do you need to have someone help you when you read instructions, pamphlets, or other written materials from your doctor or pharmacy?: 1 - Never  Diabetic?No  Interpreter Needed?: No  Information entered by :: Charlott Rakes, LPN   Activities of Daily Living In your present state of health, do you have any difficulty performing  the following activities: 06/15/2020  Hearing? Y  Comment wears hearing aids  Vision? N  Difficulty concentrating or making decisions? N  Walking or climbing stairs? N  Dressing or bathing? N  Doing errands, shopping? N  Comment has transportation by family or Chartered certified accountant and eating ? Y  Comment has a aid come in from 12:30- 6:30 to help with things and prepare meals  Using the Toilet? N  In the past six months, have you accidently leaked urine? N  Do you have problems with loss of bowel control? N  Managing your Medications? N  Managing your Finances? N  Housekeeping or managing your Housekeeping? N  Some recent data might be hidden    Patient Care Team: Caren Macadam, MD as PCP - General (Family Medicine) Minus Breeding, MD as PCP - Cardiology (Cardiology) Allyn Kenner, MD (Dermatology)  Indicate any recent Medical Services you may have received from other than [facility] providers in the past year (date may be approximate).     Assessment:   This is a routine wellness examination for Uri.  Hearing/Vision screen  Hearing Screening   125Hz  250Hz  500Hz  1000Hz  2000Hz  3000Hz  4000Hz  6000Hz  8000Hz   Right ear:           Left ear:           Comments: Pt wears hearing aids   Vision  Screening Comments: Pt hasn't followed eye Dr in years stated he feels they are doing fine after surery 4 or five years ago  Dietary issues and exercise activities discussed: Current Exercise Habits: Home exercise routine, Type of exercise: stretching;strength training/weights (30 min   a day sitting snd standing at sink upper and lower body movements), Time (Minutes): 30, Frequency (Times/Week): 5, Weekly Exercise (Minutes/Week): 150, Intensity: Mild  Goals    . Patient Stated     To get rid off walker       Depression Screen PHQ 2/9 Scores 06/15/2020 12/10/2016 07/09/2016 05/10/2016 06/08/2013  PHQ - 2 Score 0 0 1 5 0  PHQ- 9 Score - - - 14 -    Fall Risk Fall Risk  06/15/2020 09/16/2019  09/10/2018 08/30/2016 05/31/2014  Falls in the past year? 1 1 1  Yes No  Comment - Emmi Telephone Survey: data to providers prior to load Emmi Telephone Survey: data to providers prior to load - -  Number falls in past yr: 1 1 1 2  or more -  Comment - Emmi Telephone Survey Actual Response = 2 Emmi Telephone Survey Actual Response = 5 - -  Injury with Fall? 1 1 1  Yes -  Comment hurt left shoulder a month ago but exercises it for releif - - - -  Risk Factor Category  - - - High Fall Risk -  Risk for fall due to : History of fall(s);Impaired balance/gait;Impaired mobility;Impaired vision - - History of fall(s);Impaired balance/gait -  Follow up Falls prevention discussed - - - -    Any stairs in or around the home? Yes  If so, are there any without handrails? No  Home free of loose throw rugs in walkways, pet beds, electrical cords, etc? Yes  Adequate lighting in your home to reduce risk of falls? Yes   ASSISTIVE DEVICES UTILIZED TO PREVENT FALLS:  Life alert? Yes  Use of a cane, walker or w/c? Yes  Grab bars in the bathroom? Yes  Shower chair or bench in shower? Yes  Elevated toilet seat or a handicapped toilet? Yes   TIMED UP AND GO:  Was the test performed? No     Cognitive Function:     6CIT Screen 06/15/2020  What Year? 0 points  What month? 0 points  Count back from 20 0 points  Months in reverse 0 points  Repeat phrase 0 points    Immunizations Immunization History  Administered Date(s) Administered  . Fluad Quad(high Dose 65+) 07/31/2019  . H1N1 10/26/2008  . Influenza Split 08/23/2011, 07/15/2012  . Influenza Whole 07/14/2008, 08/22/2009, 09/04/2010  . Influenza, High Dose Seasonal PF 07/09/2016, 09/18/2017, 07/31/2018  . Influenza,inj,Quad PF,6+ Mos 07/07/2013, 07/07/2014, 07/23/2015  . Pneumococcal Conjugate-13 10/03/2015  . Pneumococcal Polysaccharide-23 07/12/2009  . Tdap 02/10/2018    TDAP status: Up to date Flu Vaccine status: Up to date Pneumococcal  vaccine status: Up to date Covid-19 vaccine status: Completed vaccines  Qualifies for Shingles Vaccine? Yes   Zostavax completed No   Shingrix Completed?: No.    Education has been provided regarding the importance of this vaccine. Patient has been advised to call insurance company to determine out of pocket expense if they have not yet received this vaccine. Advised may also receive vaccine at local pharmacy or Health Dept. Verbalized acceptance and understanding.  Screening Tests Health Maintenance  Topic Date Due  . COVID-19 Vaccine (1) Never done  . INFLUENZA VACCINE  05/22/2020  . TETANUS/TDAP  02/11/2028  .  PNA vac Low Risk Adult  Completed    Health Maintenance  Health Maintenance Due  Topic Date Due  . COVID-19 Vaccine (1) Never done  . INFLUENZA VACCINE  05/22/2020    Colorectal cancer screening: No longer required.   Vision Screening: Recommended annual ophthalmology exams for early detection of glaucoma and other disorders of the eye. Is the patient up to date with their annual eye exam?  No  Who is the provider or what is the name of the office in which the patient attends annual eye exams? Pt stated he is not having any difficulty at this time   Dental Screening: Recommended annual dental exams for proper oral hygiene  Community Resource Referral / Chronic Care Management: CRR required this visit?  No   CCM required this visit?  No      Plan:     I have personally reviewed and noted the following in the patient's chart:   . Medical and social history . Use of alcohol, tobacco or illicit drugs  . Current medications and supplements . Functional ability and status . Nutritional status . Physical activity . Advanced directives . List of other physicians . Hospitalizations, surgeries, and ER visits in previous 12 months . Vitals . Screenings to include cognitive, depression, and falls . Referrals and appointments  In addition, I have reviewed and  discussed with patient certain preventive protocols, quality metrics, and best practice recommendations. A written personalized care plan for preventive services as well as general preventive health recommendations were provided to patient.     Willette Brace, LPN   5/88/3254   Nurse Notes: None

## 2020-06-15 NOTE — Patient Instructions (Signed)
Medication Instructions:  Your Physician recommend you continue on your current medication as directed.    *If you need a refill on your cardiac medications before your next appointment, please call your pharmacy*   Lab Work: Your physician recommends that you return for lab work today  ([place])  If you have labs (blood work) drawn today and your tests are completely normal, you will receive your results only by: Marland Kitchen MyChart Message (if you have MyChart) OR . A paper copy in the mail If you have any lab test that is abnormal or we need to change your treatment, we will call you to review the results.   Testing/Procedures: None   Follow-Up: At [facility], you and your health needs are our priority.  As part of our continuing mission to provide you with exceptional heart care, we have created designated Provider Care Teams.  These Care Teams include your primary Cardiologist (physician) and Advanced Practice Providers (APPs -  Physician Assistants and Nurse Practitioners) who all work together to provide you with the care you need, when you need it.  We recommend signing up for the patient portal called "MyChart".  Sign up information is provided on this After Visit Summary.  MyChart is used to connect with patients for Virtual Visits (Telemedicine).  Patients are able to view lab/test results, encounter notes, upcoming appointments, etc.  Non-urgent messages can be sent to your provider as well.   To learn more about what you can do with MyChart, go to [email or web address].    Your next appointment:   6 month(s)  The format for your next appointment:   In Person  Provider:   Minus Breeding, MD

## 2020-06-15 NOTE — Patient Instructions (Addendum)
[name] , Thank you for taking time to come for your Medicare Wellness Visit. I appreciate your ongoing commitment to your health goals. Please review the following plan we discussed and let me know if I can assist you in the future.   Screening recommendations/referrals: Colonoscopy: No longer required Recommended yearly ophthalmology/optometry visit for glaucoma screening and checkup Recommended yearly dental visit for hygiene and checkup  Vaccinations: Influenza vaccine: Up to date Pneumococcal vaccine: Up to date Tdap vaccine: Done 02/10/18 Shingles vaccine: Shingrix discussed. Please contact your pharmacy for coverage information.    Covid-19: Pt stated he had Pfizer completed and will call office with dates to confirm  Advanced directives: Copies in cahrt  Conditions/risks identified: Get rid of walker  Next appointment: Follow up in one year for your annual wellness visit.   Preventive Care 84 Years and Older, Male Preventive care refers to lifestyle choices and visits with your health care provider that can promote health and wellness. What does preventive care include?  A yearly physical exam. This is also called an annual well check.  Dental exams once or twice a year.  Routine eye exams. Ask your health care provider how often you should have your eyes checked.  Personal lifestyle choices, including:  Daily care of your teeth and gums.  Regular physical activity.  Eating a healthy diet.  Avoiding tobacco and drug use.  Limiting alcohol use.  Practicing safe sex.  Taking low doses of aspirin every day.  Taking vitamin and mineral supplements as recommended by your health care provider. What happens during an annual well check? The services and screenings done by your health care provider during your annual well check will depend on your age, overall health, lifestyle risk factors, and family history of disease. Counseling  Your health care provider may  ask you questions about your:  Alcohol use.  Tobacco use.  Drug use.  Emotional well-being.  Home and relationship well-being.  Sexual activity.  Eating habits.  History of falls.  Memory and ability to understand (cognition).  Work and work Statistician. Screening  You may have the following tests or measurements:  Height, weight, and BMI.  Blood pressure.  Lipid and cholesterol levels. These may be checked every 5 years, or more frequently if you are over 84 years old.  Skin check.  Lung cancer screening. You may have this screening every year starting at age 84 if you have a 30-pack-year history of smoking and currently smoke or have quit within the past 15 years.  Fecal occult blood test (FOBT) of the stool. You may have this test every year starting at age 84.  Flexible sigmoidoscopy or colonoscopy. You may have a sigmoidoscopy every 5 years or a colonoscopy every 10 years starting at age 84.  Prostate cancer screening. Recommendations will vary depending on your family history and other risks.  Hepatitis C blood test.  Hepatitis B blood test.  Sexually transmitted disease (STD) testing.  Diabetes screening. This is done by checking your blood sugar (glucose) after you have not eaten for a while (fasting). You may have this done every 1-3 years.  Abdominal aortic aneurysm (AAA) screening. You may need this if you are a current or former smoker.  Osteoporosis. You may be screened starting at age 84 if you are at high risk. Talk with your health care provider about your test results, treatment options, and if necessary, the need for more tests. Vaccines  Your health care provider may recommend certain vaccines,  such as:  Influenza vaccine. This is recommended every year.  Tetanus, diphtheria, and acellular pertussis (Tdap, Td) vaccine. You may need a Td booster every 10 years.  Zoster vaccine. You may need this after age 84.  Pneumococcal 13-valent  conjugate (PCV13) vaccine. One dose is recommended after age 84.  Pneumococcal polysaccharide (PPSV23) vaccine. One dose is recommended after age 84. Talk to your health care provider about which screenings and vaccines you need and how often you need them. This information is not intended to replace advice given to you by your health care provider. Make sure you discuss any questions you have with your health care provider. Document Released: 11/04/2015 Document Revised: 06/27/2016 Document Reviewed: 08/09/2015 Elsevier Interactive Patient Education  2017 [place] Prevention in the Home Falls can cause injuries. They can happen to people of all ages. There are many things you can do to make your home safe and to help prevent falls. What can I do on the outside of my home?  Regularly fix the edges of walkways and driveways and fix any cracks.  Remove anything that might make you trip as you walk through a door, such as a raised step or threshold.  Trim any bushes or trees on the path to your home.  Use bright outdoor lighting.  Clear any walking paths of anything that might make someone trip, such as rocks or tools.  Regularly check to see if handrails are loose or broken. Make sure that both sides of any steps have handrails.  Any raised decks and porches should have guardrails on the edges.  Have any leaves, snow, or ice cleared regularly.  Use sand or salt on walking paths during winter.  Clean up any spills in your garage right away. This includes oil or grease spills. What can I do in the bathroom?  Use night lights.  Install grab bars by the toilet and in the tub and shower. Do not use towel bars as grab bars.  Use non-skid mats or decals in the tub or shower.  If you need to sit down in the shower, use a plastic, non-slip stool.  Keep the floor dry. Clean up any water that spills on the floor as soon as it happens.  Remove soap buildup in the tub or  shower regularly.  Attach bath mats securely with double-sided non-slip rug tape.  Do not have throw rugs and other things on the floor that can make you trip. What can I do in the bedroom?  Use night lights.  Make sure that you have a light by your bed that is easy to reach.  Do not use any sheets or blankets that are too big for your bed. They should not hang down onto the floor.  Have a firm chair that has side arms. You can use this for support while you get dressed.  Do not have throw rugs and other things on the floor that can make you trip. What can I do in the kitchen?  Clean up any spills right away.  Avoid walking on wet floors.  Keep items that you use a lot in easy-to-reach places.  If you need to reach something above you, use a strong step stool that has a grab bar.  Keep electrical cords out of the way.  Do not use floor polish or wax that makes floors slippery. If you must use wax, use non-skid floor wax.  Do not have throw rugs and other things on  the floor that can make you trip. What can I do with my stairs?  Do not leave any items on the stairs.  Make sure that there are handrails on both sides of the stairs and use them. Fix handrails that are broken or loose. Make sure that handrails are as long as the stairways.  Check any carpeting to make sure that it is firmly attached to the stairs. Fix any carpet that is loose or worn.  Avoid having throw rugs at the top or bottom of the stairs. If you do have throw rugs, attach them to the floor with carpet tape.  Make sure that you have a light switch at the top of the stairs and the bottom of the stairs. If you do not have them, ask someone to add them for you. What else can I do to help prevent falls?  Wear shoes that:  Do not have high heels.  Have rubber bottoms.  Are comfortable and fit you well.  Are closed at the toe. Do not wear sandals.  If you use a stepladder:  Make sure that it is fully  opened. Do not climb a closed stepladder.  Make sure that both sides of the stepladder are locked into place.  Ask someone to hold it for you, if possible.  Clearly mark and make sure that you can see:  Any grab bars or handrails.  First and last steps.  Where the edge of each step is.  Use tools that help you move around (mobility aids) if they are needed. These include:  Canes.  Walkers.  Scooters.  Crutches.  Turn on the lights when you go into a dark area. Replace any light bulbs as soon as they burn out.  Set up your furniture so you have a clear path. Avoid moving your furniture around.  If any of your floors are uneven, fix them.  If there are any pets around you, be aware of where they are.  Review your medicines with your doctor. Some medicines can make you feel dizzy. This can increase your chance of falling. Ask your doctor what other things that you can do to help prevent falls. This information is not intended to replace advice given to you by your health care provider. Make sure you discuss any questions you have with your health care provider. Document Released: 08/04/2009 Document Revised: 03/15/2016 Document Reviewed: 11/12/2014 Elsevier Interactive Patient Education  2017 [employer].

## 2020-06-16 LAB — COMPREHENSIVE METABOLIC PANEL
ALT: 20 IU/L (ref 0–44)
AST: 35 IU/L (ref 0–40)
Albumin/Globulin Ratio: 1.1 — ABNORMAL LOW (ref 1.2–2.2)
Albumin: 4.1 g/dL (ref 3.6–4.6)
Alkaline Phosphatase: 121 IU/L (ref 48–121)
BUN/Creatinine Ratio: 15 (ref 10–24)
BUN: 20 mg/dL (ref 8–27)
Bilirubin Total: 0.4 mg/dL (ref 0.0–1.2)
CO2: 26 mmol/L (ref 20–29)
Calcium: 9.5 mg/dL (ref 8.6–10.2)
Chloride: 100 mmol/L (ref 96–106)
Creatinine, Ser: 1.33 mg/dL — ABNORMAL HIGH (ref 0.76–1.27)
GFR calc Af Amer: 55 mL/min/{1.73_m2} — ABNORMAL LOW (ref >59)
GFR calc non Af Amer: 48 mL/min/{1.73_m2} — ABNORMAL LOW (ref >59)
Globulin, Total: 3.6 g/dL (ref 1.5–4.5)
Glucose: 84 mg/dL (ref 65–99)
Potassium: 5.5 mmol/L — ABNORMAL HIGH (ref 3.5–5.2)
Sodium: 140 mmol/L (ref 134–144)
Total Protein: 7.7 g/dL (ref 6.0–8.5)

## 2020-06-17 ENCOUNTER — Other Ambulatory Visit: Payer: Self-pay

## 2020-06-17 DIAGNOSIS — Z79899 Other long term (current) drug therapy: Secondary | ICD-10-CM

## 2020-06-20 ENCOUNTER — Ambulatory Visit (INDEPENDENT_AMBULATORY_CARE_PROVIDER_SITE_OTHER): Payer: Medicare Other

## 2020-06-20 DIAGNOSIS — I5022 Chronic systolic (congestive) heart failure: Secondary | ICD-10-CM | POA: Diagnosis not present

## 2020-06-20 DIAGNOSIS — Z95810 Presence of automatic (implantable) cardiac defibrillator: Secondary | ICD-10-CM

## 2020-06-21 ENCOUNTER — Telehealth: Payer: Self-pay

## 2020-06-21 NOTE — Telephone Encounter (Signed)
Remote ICM transmission received.  Attempted call to daughter [name] regarding ICM remote transmission and left detailed message per DPR.  Advised to return call for any fluid symptoms or questions.

## 2020-06-21 NOTE — Progress Notes (Signed)
EPIC Encounter for ICM Monitoring  Patient Name: [name] is a 86 y.o. male Date: 06/21/2020 Primary Care Physican: Caren Macadam, MD Primary Cardiologist: Hochrein Electrophysiologist: Faustino Congress Weight:unknown  Attempted call to [name] and unable to reach.  Left detailed message per DPR regarding transmission. Transmission reviewed.   Optivol thoracic impedancereturned close to baseline normal.  Prescribed:  Furosemide20 mg 1 tabletdaily. Potassium 10 mEq 1 tabletdaily.   Labs: 11/27/2019 Creatinine 1.40, BUN 22, Potassium 4.7, Sodium 136, GFR 47.97 05/20/2019 Creatinine 1.28, BUN 21, Potassium 4.9, Sodium 136 A complete set of results can be found in Results Review.  Recommendations: Left voice mail with ICM number and encouraged to call if experiencing any fluid symptoms.  Follow-up plan: ICM clinic phone appointment on10/10/2019. 91 day device clinic remote transmission 08/03/2020.   EP/Cardiology Office Visits:07/25/2020 with Dr.Klein.  Copy of ICM check sent to Dr.Klein and Dr Percival Spanish.    3 month ICM trend: 06/20/2020    1 Year ICM trend:       [name], RN 06/21/2020 4:32 PM

## 2020-06-28 DIAGNOSIS — X32XXXD Exposure to sunlight, subsequent encounter: Secondary | ICD-10-CM | POA: Diagnosis not present

## 2020-06-28 DIAGNOSIS — Z85828 Personal history of other malignant neoplasm of skin: Secondary | ICD-10-CM | POA: Diagnosis not present

## 2020-06-28 DIAGNOSIS — Z08 Encounter for follow-up examination after completed treatment for malignant neoplasm: Secondary | ICD-10-CM | POA: Diagnosis not present

## 2020-06-28 DIAGNOSIS — L570 Actinic keratosis: Secondary | ICD-10-CM | POA: Diagnosis not present

## 2020-07-01 ENCOUNTER — Other Ambulatory Visit: Payer: Self-pay

## 2020-07-01 DIAGNOSIS — Z79899 Other long term (current) drug therapy: Secondary | ICD-10-CM

## 2020-07-02 LAB — BASIC METABOLIC PANEL
BUN/Creatinine Ratio: 15 (ref 10–24)
BUN: 21 mg/dL (ref 8–27)
CO2: 26 mmol/L (ref 20–29)
Calcium: 9.9 mg/dL (ref 8.6–10.2)
Chloride: 98 mmol/L (ref 96–106)
Creatinine, Ser: 1.39 mg/dL — ABNORMAL HIGH (ref 0.76–1.27)
GFR calc Af Amer: 52 mL/min/{1.73_m2} — ABNORMAL LOW (ref >59)
GFR calc non Af Amer: 45 mL/min/{1.73_m2} — ABNORMAL LOW (ref >59)
Glucose: 85 mg/dL (ref 65–99)
Potassium: 4.9 mmol/L (ref 3.5–5.2)
Sodium: 140 mmol/L (ref 134–144)

## 2020-07-03 ENCOUNTER — Other Ambulatory Visit: Payer: Self-pay | Admitting: Family Medicine

## 2020-07-10 ENCOUNTER — Other Ambulatory Visit: Payer: Self-pay | Admitting: Family Medicine

## 2020-07-11 ENCOUNTER — Encounter: Payer: Self-pay | Admitting: Family Medicine

## 2020-07-11 ENCOUNTER — Telehealth: Payer: Self-pay | Admitting: Pulmonary Disease

## 2020-07-11 NOTE — Telephone Encounter (Signed)
Spoke with the pt's daughter  She states that there is no option in [place] for pt to be able to send email to Dr Valeta Harms  Normally would send msg to Duaine Dredge but she has retired to unsure who to ask about this  Ander Purpura, can you please advise when you return? Thanks

## 2020-07-11 NOTE — Telephone Encounter (Signed)
Spoke with the pt  He states he did not call today  He does not need to send an email and did not try to send an email  Nothing needed

## 2020-07-12 NOTE — Telephone Encounter (Signed)
I have sent a message to [name] to check into why patient cannot send Dr. Valeta Harms a message on [place]

## 2020-07-15 NOTE — Telephone Encounter (Signed)
Crystal is out of the office. Will communicate when she returns.

## 2020-07-19 ENCOUNTER — Ambulatory Visit: Payer: Medicare Other

## 2020-07-20 NOTE — Telephone Encounter (Signed)
Daughter aware  Nothing further needed

## 2020-07-20 NOTE — Telephone Encounter (Signed)
Lauren, please advise if you have an update.

## 2020-07-24 ENCOUNTER — Other Ambulatory Visit: Payer: Self-pay | Admitting: Rheumatology

## 2020-07-24 ENCOUNTER — Other Ambulatory Visit: Payer: Self-pay | Admitting: Internal Medicine

## 2020-07-24 ENCOUNTER — Other Ambulatory Visit: Payer: Self-pay | Admitting: Family Medicine

## 2020-07-24 DIAGNOSIS — D696 Thrombocytopenia, unspecified: Secondary | ICD-10-CM

## 2020-07-25 ENCOUNTER — Ambulatory Visit (INDEPENDENT_AMBULATORY_CARE_PROVIDER_SITE_OTHER): Payer: Medicare Other | Admitting: Internal Medicine

## 2020-07-25 ENCOUNTER — Encounter: Payer: Self-pay | Admitting: Pulmonary Disease

## 2020-07-25 ENCOUNTER — Ambulatory Visit (INDEPENDENT_AMBULATORY_CARE_PROVIDER_SITE_OTHER): Payer: Medicare Other | Admitting: Pulmonary Disease

## 2020-07-25 ENCOUNTER — Other Ambulatory Visit: Payer: Self-pay

## 2020-07-25 ENCOUNTER — Encounter: Payer: Self-pay | Admitting: Internal Medicine

## 2020-07-25 VITALS — BP 152/84 | HR 67 | Ht 71.0 in | Wt 183.0 lb

## 2020-07-25 VITALS — BP 140/78 | HR 78 | Temp 98.0°F | Ht 71.0 in | Wt 181.8 lb

## 2020-07-25 DIAGNOSIS — I255 Ischemic cardiomyopathy: Secondary | ICD-10-CM | POA: Diagnosis not present

## 2020-07-25 DIAGNOSIS — Z95810 Presence of automatic (implantable) cardiac defibrillator: Secondary | ICD-10-CM | POA: Diagnosis not present

## 2020-07-25 DIAGNOSIS — I472 Ventricular tachycardia, unspecified: Secondary | ICD-10-CM

## 2020-07-25 DIAGNOSIS — J9611 Chronic respiratory failure with hypoxia: Secondary | ICD-10-CM

## 2020-07-25 DIAGNOSIS — Z9189 Other specified personal risk factors, not elsewhere classified: Secondary | ICD-10-CM

## 2020-07-25 DIAGNOSIS — Z79899 Other long term (current) drug therapy: Secondary | ICD-10-CM | POA: Diagnosis not present

## 2020-07-25 DIAGNOSIS — R0609 Other forms of dyspnea: Secondary | ICD-10-CM

## 2020-07-25 DIAGNOSIS — R0602 Shortness of breath: Secondary | ICD-10-CM

## 2020-07-25 DIAGNOSIS — R059 Cough, unspecified: Secondary | ICD-10-CM | POA: Diagnosis not present

## 2020-07-25 DIAGNOSIS — Z23 Encounter for immunization: Secondary | ICD-10-CM | POA: Diagnosis not present

## 2020-07-25 DIAGNOSIS — I5042 Chronic combined systolic (congestive) and diastolic (congestive) heart failure: Secondary | ICD-10-CM

## 2020-07-25 DIAGNOSIS — R0600 Dyspnea, unspecified: Secondary | ICD-10-CM

## 2020-07-25 DIAGNOSIS — I350 Nonrheumatic aortic (valve) stenosis: Secondary | ICD-10-CM

## 2020-07-25 LAB — CUP PACEART INCLINIC DEVICE CHECK
Battery Remaining Longevity: 47 mo
Battery Voltage: 2.96 V
Brady Statistic AP VP Percent: 0.07 %
Brady Statistic AP VS Percent: 99.37 %
Brady Statistic AS VP Percent: 0 %
Brady Statistic AS VS Percent: 0.56 %
Brady Statistic RA Percent Paced: 99.43 %
Brady Statistic RV Percent Paced: 0.07 %
Date Time Interrogation Session: 20211004172601
HighPow Impedance: 44 Ohm
HighPow Impedance: 64 Ohm
Implantable Lead Implant Date: 20090623
Implantable Lead Implant Date: 20090623
Implantable Lead Location: 753859
Implantable Lead Location: 753860
Implantable Lead Model: 5076
Implantable Lead Model: 6947
Implantable Pulse Generator Implant Date: 20170616
Lead Channel Impedance Value: 285 Ohm
Lead Channel Impedance Value: 342 Ohm
Lead Channel Impedance Value: 418 Ohm
Lead Channel Pacing Threshold Amplitude: 1 V
Lead Channel Pacing Threshold Amplitude: 1.125 V
Lead Channel Pacing Threshold Pulse Width: 0.4 ms
Lead Channel Pacing Threshold Pulse Width: 0.4 ms
Lead Channel Sensing Intrinsic Amplitude: 0.875 mV
Lead Channel Sensing Intrinsic Amplitude: 1.125 mV
Lead Channel Sensing Intrinsic Amplitude: 7.125 mV
Lead Channel Sensing Intrinsic Amplitude: 7.125 mV
Lead Channel Setting Pacing Amplitude: 2.25 V
Lead Channel Setting Pacing Amplitude: 2.5 V
Lead Channel Setting Pacing Pulse Width: 0.4 ms
Lead Channel Setting Sensing Sensitivity: 0.6 mV

## 2020-07-25 NOTE — Patient Instructions (Signed)
Medication Instructions:  Your physician recommends that you continue on your current medications as directed. Please refer to the Current Medication list given to you today.  *If you need a refill on your cardiac medications before your next appointment, please call your pharmacy*   Lab Work: None ordered.  If you have labs (blood work) drawn today and your tests are completely normal, you will receive your results only by: MyChart Message (if you have MyChart) OR A paper copy in the mail If you have any lab test that is abnormal or we need to change your treatment, we will call you to review the results.   Testing/Procedures:  Your physician has requested that you have an echocardiogram. Echocardiography is a painless test that uses sound waves to create images of your heart. It provides your doctor with information about the size and shape of your heart and how well your heart's chambers and valves are working. This procedure takes approximately one hour. There are no restrictions for this procedure.    Follow-Up: At [facility], you and your health needs are our priority.  As part of our continuing mission to provide you with exceptional heart care, we have created designated Provider Care Teams.  These Care Teams include your primary Cardiologist (physician) and Advanced Practice Providers (APPs -  Physician Assistants and Nurse Practitioners) who all work together to provide you with the care you need, when you need it.  We recommend signing up for the patient portal called "MyChart".  Sign up information is provided on this After Visit Summary.  MyChart is used to connect with patients for Virtual Visits (Telemedicine).  Patients are able to view lab/test results, encounter notes, upcoming appointments, etc.  Non-urgent messages can be sent to your provider as well.   To learn more about what you can do with MyChart, go to [email or web address].    Your next appointment:   6  month(s)  The format for your next appointment:   In Person  Provider:   Steven Klein, MD  
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
## 2020-07-25 NOTE — Patient Instructions (Addendum)
Thank you for visiting Dr. Valeta Harms at [facility]. Today we recommend the following:  Orders Placed This Encounter  Procedures  . CT CHEST HIGH RESOLUTION   Flu shot today Stay on current nebulizer regimen  Return in about 2 months (around 09/24/2020) for with APP or Dr. Valeta Harms. after HRCT completed.     Please do your part to reduce the spread of COVID-19.

## 2020-07-25 NOTE — Telephone Encounter (Signed)
Please advise patient to see his hematologist for thrombocytopenia
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
## 2020-07-25 NOTE — Telephone Encounter (Signed)
Last Visit:03/02/2020 Next Visit:08/31/2020 Labs: 05/02/2020 CBC: RBC 4.19, Platelets 115 06/15/2020 CMP Creat. 1.33, GFR 48 Potassium 5.5 Albumin/Globulin Ratio 1.1  Current Dose per office note on 03/02/2020: Fosamax 70 mg 1 tablet by mouth once weekly  DX: Age-related osteoporosis with current pathological fracture with routine healing  Okay to refill Fosamax?

## 2020-07-26 ENCOUNTER — Ambulatory Visit: Payer: Medicare Other | Attending: Internal Medicine

## 2020-07-26 DIAGNOSIS — Z23 Encounter for immunization: Secondary | ICD-10-CM

## 2020-07-26 NOTE — Progress Notes (Signed)
   Covid-19 Vaccination Clinic  Name:  [name]    MRN: [id number] DOB: [birth year]  07/26/2020  [name] was observed post Covid-19 immunization for 15 minutes without incident. He was provided with Vaccine Information Sheet and instruction to access the V-Safe system.   [name] was instructed to call 911 with any severe reactions post vaccine: Marland Kitchen Difficulty breathing  . Swelling of face and throat  . A fast heartbeat  . A bad rash all over body  . Dizziness and weakness

## 2020-07-26 NOTE — Telephone Encounter (Signed)
Spoke with patient and advised Dr. Estanislado Pandy would like for him to follow up with hematology for thrombocytopenia. Patient states he does not have a hematologist.

## 2020-07-26 NOTE — Telephone Encounter (Signed)
Referral placed.

## 2020-07-26 NOTE — Addendum Note (Signed)
Addended by: Carole Binning on: 07/26/2020 12:38 PM   Modules accepted: Orders

## 2020-07-26 NOTE — Addendum Note (Signed)
Addended by: Carole Binning on: 07/26/2020 09:14 AM   Modules accepted: Orders

## 2020-07-26 NOTE — Telephone Encounter (Signed)
yes

## 2020-07-28 ENCOUNTER — Encounter: Payer: Self-pay | Admitting: Rheumatology

## 2020-07-28 ENCOUNTER — Telehealth: Payer: Self-pay | Admitting: Oncology

## 2020-07-28 NOTE — Telephone Encounter (Signed)
Received a new pt referral from Dr. Estanislado Pandy for thrombocytopenia. [name] has been cld and scheduled to see Dr. Alen Blew on 10/26 at 11am. Pt aware to arrive 15 minutes early. Letter mailed.

## 2020-08-01 ENCOUNTER — Ambulatory Visit (INDEPENDENT_AMBULATORY_CARE_PROVIDER_SITE_OTHER): Payer: Medicare Other

## 2020-08-01 DIAGNOSIS — Z95810 Presence of automatic (implantable) cardiac defibrillator: Secondary | ICD-10-CM | POA: Diagnosis not present

## 2020-08-01 DIAGNOSIS — I5042 Chronic combined systolic (congestive) and diastolic (congestive) heart failure: Secondary | ICD-10-CM

## 2020-08-02 NOTE — Progress Notes (Signed)
EPIC Encounter for ICM Monitoring  Patient Name: [name] is a 86 y.o. male Date: 08/02/2020 Primary Care Physican: Caren Macadam, MD Primary Cardiologist: [place] Electrophysiologist: Faustino Congress Weight:unknown  Transmission reviewed.   Optivol thoracic impedancereturned close to baseline normal.  Prescribed:  Furosemide20 mg 1 tabletdaily. Potassium 10 mEq 1 tabletdaily.   Labs: 07/01/2020 Creatinine 1.39, BUN 21, Potassium 4.9, Sodium 140, GFR 45-52 06/15/2020 Creatinine 1.33, BUN 20, Potassium 5.5, Sodium 140, GFR 48-55  A complete set of results can be found in Results Review.  Recommendations:No changes  Follow-up plan: ICM clinic phone appointment on11/16/2021. 91 day device clinic remote transmission 11/02/2020.   EP/Cardiology Office Visits:01/24/2021 with Dr.Klein.  Copy of ICM check sent to [facility]  3 month ICM trend: 08/01/2020    1 Year ICM trend:       [name], RN 08/02/2020 10:55 AM

## 2020-08-03 ENCOUNTER — Ambulatory Visit (INDEPENDENT_AMBULATORY_CARE_PROVIDER_SITE_OTHER): Payer: Medicare Other

## 2020-08-03 DIAGNOSIS — R55 Syncope and collapse: Secondary | ICD-10-CM

## 2020-08-03 LAB — CUP PACEART REMOTE DEVICE CHECK
Battery Remaining Longevity: 46 mo
Battery Voltage: 2.96 V
Brady Statistic AP VP Percent: 0.08 %
Brady Statistic AP VS Percent: 99 %
Brady Statistic AS VP Percent: 0 %
Brady Statistic AS VS Percent: 0.92 %
Brady Statistic RA Percent Paced: 99.08 %
Brady Statistic RV Percent Paced: 0.09 %
Date Time Interrogation Session: 20211013043823
HighPow Impedance: 45 Ohm
HighPow Impedance: 57 Ohm
Implantable Lead Implant Date: 20090623
Implantable Lead Implant Date: 20090623
Implantable Lead Location: 753859
Implantable Lead Location: 753860
Implantable Lead Model: 5076
Implantable Lead Model: 6947
Implantable Pulse Generator Implant Date: 20170616
Lead Channel Impedance Value: 304 Ohm
Lead Channel Impedance Value: 342 Ohm
Lead Channel Impedance Value: 399 Ohm
Lead Channel Pacing Threshold Amplitude: 0.875 V
Lead Channel Pacing Threshold Amplitude: 1.125 V
Lead Channel Pacing Threshold Pulse Width: 0.4 ms
Lead Channel Pacing Threshold Pulse Width: 0.4 ms
Lead Channel Sensing Intrinsic Amplitude: 0.75 mV
Lead Channel Sensing Intrinsic Amplitude: 0.75 mV
Lead Channel Sensing Intrinsic Amplitude: 8.875 mV
Lead Channel Sensing Intrinsic Amplitude: 8.875 mV
Lead Channel Setting Pacing Amplitude: 2.25 V
Lead Channel Setting Pacing Amplitude: 2.5 V
Lead Channel Setting Pacing Pulse Width: 0.4 ms
Lead Channel Setting Sensing Sensitivity: 0.6 mV

## 2020-08-08 NOTE — Progress Notes (Signed)
Remote ICD transmission.   

## 2020-08-09 ENCOUNTER — Ambulatory Visit (HOSPITAL_COMMUNITY): Payer: Medicare Other | Attending: Cardiovascular Disease

## 2020-08-09 ENCOUNTER — Telehealth: Payer: Self-pay | Admitting: Pulmonary Disease

## 2020-08-09 ENCOUNTER — Other Ambulatory Visit: Payer: Self-pay

## 2020-08-09 DIAGNOSIS — I350 Nonrheumatic aortic (valve) stenosis: Secondary | ICD-10-CM

## 2020-08-09 LAB — ECHOCARDIOGRAM COMPLETE
AR max vel: 1.21 cm2
AV Area VTI: 1.33 cm2
AV Area mean vel: 1.13 cm2
AV Mean grad: 13.5 mmHg
AV Peak grad: 25.7 mmHg
Ao pk vel: 2.54 m/s
P 1/2 time: 444 msec
S' Lateral: 3 cm

## 2020-08-09 MED ORDER — PERFLUTREN LIPID MICROSPHERE
1.0000 mL | INTRAVENOUS | Status: AC | PRN
  Administered 2020-08-09: 4 mL via INTRAVENOUS

## 2020-08-10 NOTE — Telephone Encounter (Signed)
The Daughter called Friday and left a message on 45 old phone I called her back and left a detailed message letting her know that the Ct Was due in Dec and we call a month ahead of time to schedule and if she had any questions she could call [phone].

## 2020-08-10 NOTE — Telephone Encounter (Signed)
[facility] calling again to see when pt CT scan will be scheduled - states she has called 3 times for someone to call her back with the appt date and time - please call [phone]

## 2020-08-10 NOTE — Telephone Encounter (Signed)
Hey Triage can you check and see if its ok to go ahead and schedule looks like the patient has a follow up on 11/18

## 2020-08-10 NOTE — Telephone Encounter (Signed)
Dr. Valeta Harms - you wanted this pt to have a HRCT in December, would it be okay to schedule before his ROV on 11/18? Thanks!

## 2020-08-11 NOTE — Telephone Encounter (Signed)
Yes ok to schedule CT prior to appt.  Garner Nash, DO [facility] 08/11/2020 11:24 AM

## 2020-08-12 NOTE — Telephone Encounter (Signed)
Lm for patient's daughter, connie(DPR)

## 2020-08-12 NOTE — Telephone Encounter (Signed)
Damian Leavell, RN routed conversation to [employer] Triage 1 hour ago (7:49 AM)  Minus Breeding, MD  Damian Leavell, RN 14 hours ago (5:54 PM)   Yes. Discontinue the potassium

## 2020-08-14 ENCOUNTER — Other Ambulatory Visit: Payer: Self-pay | Admitting: Cardiology

## 2020-08-16 ENCOUNTER — Inpatient Hospital Stay: Payer: Medicare Other | Attending: Oncology | Admitting: Oncology

## 2020-08-16 ENCOUNTER — Other Ambulatory Visit: Payer: Self-pay

## 2020-08-16 VITALS — BP 117/75 | HR 86 | Temp 97.4°F | Resp 18 | Ht 71.0 in | Wt 183.2 lb

## 2020-08-16 DIAGNOSIS — J449 Chronic obstructive pulmonary disease, unspecified: Secondary | ICD-10-CM | POA: Diagnosis not present

## 2020-08-16 DIAGNOSIS — Z87891 Personal history of nicotine dependence: Secondary | ICD-10-CM | POA: Insufficient documentation

## 2020-08-16 DIAGNOSIS — M810 Age-related osteoporosis without current pathological fracture: Secondary | ICD-10-CM | POA: Diagnosis not present

## 2020-08-16 DIAGNOSIS — D721 Eosinophilia, unspecified: Secondary | ICD-10-CM | POA: Diagnosis not present

## 2020-08-16 DIAGNOSIS — Z800 Family history of malignant neoplasm of digestive organs: Secondary | ICD-10-CM | POA: Insufficient documentation

## 2020-08-16 DIAGNOSIS — D696 Thrombocytopenia, unspecified: Secondary | ICD-10-CM | POA: Diagnosis not present

## 2020-08-16 DIAGNOSIS — I2510 Atherosclerotic heart disease of native coronary artery without angina pectoris: Secondary | ICD-10-CM | POA: Insufficient documentation

## 2020-08-16 DIAGNOSIS — I255 Ischemic cardiomyopathy: Secondary | ICD-10-CM

## 2020-08-16 NOTE — Progress Notes (Signed)
Reason for the request:   Thrombocytopenia  HPI: I was asked by Dr. Estanislado Pandy to evaluate [name] for evaluation of thrombocytopenia.  He is a 86 year old man with history of coronary artery disease, COPD as well as osteoporosis among other comorbid conditions.  He was noted to have platelet count of 115 in July 2021.  In April his platelet count was 127 and a platelet count of 131 in 2019.  Reviewing his labs dating back to 2009 his platelet count has fluctuated between normal range and mildly low close to 100,000.  His CBC does not show any other abnormalities.  His white cell count is normal with a normal hemoglobin.  He does have mild eosinophilia which has been reported periodically.  Clinically, reports no major complaints at this time.  He denies any hematochezia melena or hemoptysis.  He does report occasional bruising but no other constitutional symptoms.  He denies any falls or syncope.  Continues to live independently but his children check on him periodically.  He  does not report any headaches, blurry vision, syncope or seizures. Does not report any fevers, chills or sweats.  Does not report any cough, wheezing or hemoptysis.  Does not report any chest pain, palpitation, orthopnea or leg edema.  Does not report any nausea, vomiting or abdominal pain.  Does not report any constipation or diarrhea.  Does not report any skeletal complaints.    Does not report frequency, urgency or hematuria.  Does not report any skin rashes or lesions. Does not report any heat or cold intolerance.  Does not report any lymphadenopathy or petechiae.  Does not report any anxiety or depression.  Remaining review of systems is negative.    Past Medical History:  Diagnosis Date  . AAA (abdominal aortic aneurysm) ([place])    a. 2002 s/p repair.  . Allergic rhinitis   . Back pain   . CAD (coronary artery disease)    a. 02/2002 H/o MI with stenting x 2; b. 04/2013 MV: EF 25%, large posterior lateral infarct w/o  ischemia; c. 03/2016 VT Arrest/Cath: LM 60ost, LAD 40p/m ISR, LCX 100p/m, RCA nl-->Med Rx.  . Carotid arterial disease ([place])    a. 12/2010 s/p R CEA;  b. 05/2015 Carotid U/S: bilat <40% ICA stenosis.  . Chronic combined systolic and diastolic CHF (congestive heart failure) ([place])    a. 03/2016 Echo: Ef 35-40%, grade 1 DD.  Marland Kitchen Compression fracture   . COPD (chronic obstructive pulmonary disease) ([place])   . Dermatitis   . Diverticulosis of colon   . DJD (degenerative joint disease)    and Gout  . Hemorrhoids   . History of colonic polyps   . History of gout   . History of pneumonia   . Hypercholesterolemia   . Hypertension   . Hypertensive heart disease   . Ischemic cardiomyopathy    a. EF prev <35%-->improved to normal by Echo 8/14:  Mild LVH, focal basal hypertrophy, EF 60-65%, normal wall motion, mild BAE, PASP 36; c. 03/2016 Echo: EF 35-40%, Gr1 DD, triv AI, mild MR, mod dil LA, mild-mod TR, PASP 57mHg.  . Osteoporosis   . Peripheral vascular disease ([place]   . Presence of cardiac defibrillator    a. 03/2008 s/p MDT [id number] Maximo II DR, DC AICD; b. 03/2013: ICD shock for T wave oversensing;  c. 10/2015: collective decision not to replace ICD given improvement in LV fxn; d. VT Arrest-->Gen change to MDT ser # [id number].  . Skin cancer  shoulders and forehead  . Syncope   . T wave over sensing resulting in inappropriate shocks    a. 03/2013.  . Tobacco abuse   . Ventricular tachycardia ([place]) 04/01/2016  :  Past Surgical History:  Procedure Laterality Date  . ABDOMINAL AORTIC ANEURYSM REPAIR  2002   by Dr. Kellie Simmering  . aicd placed  03/2008   Dr. Caryl Comes  . cad stent  02/2002   Dr. Percival Spanish  . CARDIAC CATHETERIZATION     X 2 stents  . CARDIAC CATHETERIZATION N/A 04/03/2016   Procedure: Left Heart Cath and Coronary Angiography;  Surgeon: Belva Crome, MD;  Location: [facility];  Service: Cardiovascular;  Laterality: N/A;  . CARDIAC DEFIBRILLATOR PLACEMENT  2009  . CARDIAC  DEFIBRILLATOR PLACEMENT    . CAROTID ENDARTERECTOMY Right January 02, 2011  . EAR CYST EXCISION Left 12/13/2015   Procedure: Excision left ear lesion ;  Surgeon: Melissa Montane, MD;  Location: [facility];  Service: ENT;  Laterality: Left;  . EP IMPLANTABLE DEVICE N/A 04/06/2016   Procedure:  ICD Generator Changeout;  Surgeon: Deboraha Sprang, MD;  Location: [facility];  Service: Cardiovascular;  Laterality: N/A;  . EXCISION OF LESION LEFT EAR Left 12/13/2015  . EXTERNAL EAR SURGERY Left    Cancer Removal from left ear  . I & D EXTREMITY Left 04/23/2016   Procedure: IRRIGATION AND DEBRIDEMENT HAND;  Surgeon: Leandrew Koyanagi, MD;  Location: [facility];  Service: Orthopedics;  Laterality: Left;  . IR IMAGE GUIDED DRAINAGE BY PERCUTANEOUS CATHETER  10/08/2017  . lumbar back     done after compression fracture  . OPEN REDUCTION INTERNAL FIXATION (ORIF) METACARPAL Left 04/04/2016   Procedure: OPEN REDUCTION INTERNAL FIXATION (ORIF) LEFT 2ND, 3RD, 4TH METACARPAL FRACTURE;  Surgeon: Leandrew Koyanagi, MD;  Location: [facility];  Service: Orthopedics;  Laterality: Left;  OPEN REDUCTION INTERNAL FIXATION (ORIF) LEFT 2ND, 3RD, 4TH METACARPAL FRACTURE  . OPEN REDUCTION INTERNAL FIXATION (ORIF) METACARPAL Left 04/23/2016   Procedure: REVISION OPEN REDUCTION INTERNAL FIXATION (ORIF) 2ND METACARPAL;  Surgeon: Leandrew Koyanagi, MD;  Location: [facility];  Service: Orthopedics;  Laterality: Left;  . right corotid enderectomy  12/2010   Dr. Scot Dock  . SKIN SPLIT GRAFT Left 12/13/2015   Procedure: with possible skin graft;  Surgeon: Melissa Montane, MD;  Location: [facility];  Service: ENT;  Laterality: Left;  . TONSILLECTOMY    :   Current Outpatient Medications:  .  alendronate (FOSAMAX) 70 MG tablet, TAKE 1 TAB ONCE A WEEK, AT LEAST 30 MIN BEFORE 1ST FOOD.DO NOT LIE DOWN FOR 30 MIN AFTER TAKING., Disp: 12 tablet, Rfl: 0 .  allopurinol (ZYLOPRIM) 100 MG tablet, TAKE 1 TABLET ONCE DAILY., Disp: 30 tablet, Rfl: 10 .  amiodarone (PACERONE) 200 MG tablet,  TAKE 1 TABLET (200MG) BY MOUTH 5 DAYS PER WEEK., Disp: 90 tablet, Rfl: 3 .  aspirin EC 81 MG tablet, Take 81 mg by mouth daily., Disp: , Rfl:  .  budesonide (PULMICORT) 0.5 MG/2ML nebulizer solution, Take 2 mLs (0.5 mg total) by nebulization 2 (two) times daily., Disp: 120 mL, Rfl: 0 .  carvedilol (COREG) 3.125 MG tablet, TAKE 1 TABLET BY MOUTH TWICE DAILY WITH A MEAL. (Patient taking differently: As directed), Disp: 180 tablet, Rfl: 3 .  cholecalciferol (VITAMIN D) 1000 units tablet, Take 1,000 Units by mouth daily. , Disp: , Rfl:  .  fluticasone (CUTIVATE) 0.05 % cream, Apply 1 application topically 2 (two) times daily. ,  Disp: , Rfl: 2 .  formoterol (PERFOROMIST) 20 MCG/2ML nebulizer solution, Take 2 mLs (20 mcg total) by nebulization 2 (two) times daily., Disp: 120 mL, Rfl: 5 .  furosemide (LASIX) 20 MG tablet, Take 1 tablet (20 mg total) by mouth daily. Please keep upcoming appt in October with Dr. Caryl Comes before anymore refills. Thank you, Disp: 30 tablet, Rfl: 2 .  guaiFENesin (MUCINEX) 600 MG 12 hr tablet, Take 600 mg by mouth in the morning, at noon, in the evening, and at bedtime. , Disp: , Rfl:  .  levothyroxine (SYNTHROID) 125 MCG tablet, TAKE 1 TABLET ONCE DAILY., Disp: 90 tablet, Rfl: 1 .  magnesium oxide (MAG-OX) 400 (241.3 Mg) MG tablet, Take 0.5 tablets (200 mg total) by mouth daily., Disp: 30 tablet, Rfl: 0 .  midodrine (PROAMATINE) 2.5 MG tablet, TAKE 1 TABLET 3 TIMES DAILY WITH MEALS., Disp: 270 tablet, Rfl: 3 .  Multiple Vitamins-Minerals (PRESERVISION AREDS 2 PO), Take 1 capsule by mouth daily., Disp: , Rfl:  .  oxybutynin (DITROPAN) 5 MG tablet, TAKE 1 TABLET BY MOUTH TWICE DAILY., Disp: 60 tablet, Rfl: 4 .  polyethylene glycol (MIRALAX / GLYCOLAX) 17 g packet, Take 17 g by mouth daily., Disp: , Rfl:  .  pravastatin (PRAVACHOL) 40 MG tablet, TAKE 1 TABLET ONCE DAILY., Disp: 90 tablet, Rfl: 1 .  Revefenacin (YUPELRI) 175 MCG/3ML SOLN, Inhale 3 mLs into the lungs daily., Disp: 120  mL, Rfl: 5 .  sertraline (ZOLOFT) 50 MG tablet, TAKE ONE TABLET AT BEDTIME., Disp: 30 tablet, Rfl: 5 .  thiamine 100 MG tablet, Take 1 tablet (100 mg total) by mouth daily., Disp: 30 tablet, Rfl: 0:  Allergies  Allergen Reactions  . Lisinopril     BP dropped too low per daughter  :  Family History  Problem Relation Age of Onset  . Hypertension Father   . Heart disease Father 10       Heart Disease before age 79  . Alcohol abuse Father        causing multiple medical problems; uncertain exact cause of death  . Hypertension Mother   . Heart disease Mother        Heart Disease before age 40  . Cancer Mother        pancreatic  . Scoliosis Sister   . Thyroid disease Daughter   . Anxiety disorder Son   :  Social History   Socioeconomic History  . Marital status: Widowed    Spouse name: Not on file  . Number of children: 3  . Years of education: Not on file  . Highest education level: Not on file  Occupational History  . Occupation: laywer    Comment: retired  Tobacco Use  . Smoking status: Former Smoker    Packs/day: 1.50    Years: 65.00    Pack years: 97.50    Types: Cigarettes, Cigars, Pipe    Quit date: 03/13/2008    Years since quitting: 12.4  . Smokeless tobacco: Never Used  . Tobacco comment: quit about 10 years ago  Vaping Use  . Vaping Use: Never used  Substance and Sexual Activity  . Alcohol use: Yes    Alcohol/week: 14.0 standard drinks    Types: 7 Glasses of wine, 7 Cans of beer per week    Comment: h/o heavy use, but "quit" drinking and now only drinks 1-3 daily  . Drug use: Never  . Sexual activity: Not on file  Other Topics Concern  . Not  on file  Social History Narrative   ** Merged History Encounter **       Lives locally.  Had been living with wife who had become quite ill recently and died on the evening of 04/15/2016.   Social Determinants of Health   Financial Resource Strain: Low Risk   . Difficulty of Paying Living Expenses: Not hard at  all  Food Insecurity: No Food Insecurity  . Worried About Charity fundraiser in the Last Year: Never true  . Ran Out of Food in the Last Year: Never true  Transportation Needs: No Transportation Needs  . Lack of Transportation (Medical): No  . Lack of Transportation (Non-Medical): No  Physical Activity: Sufficiently Active  . Days of Exercise per Week: 5 days  . Minutes of Exercise per Session: 30 min  Stress: No Stress Concern Present  . Feeling of Stress : Not at all  Social Connections: Socially Isolated  . Frequency of Communication with Friends and Family: More than three times a week  . Frequency of Social Gatherings with Friends and Family: Twice a week  . Attends Religious Services: Never  . Active Member of Clubs or Organizations: No  . Attends Archivist Meetings: Never  . Marital Status: Widowed  Intimate Partner Violence: Not At Risk  . Fear of Current or Ex-Partner: No  . Emotionally Abused: No  . Physically Abused: No  . Sexually Abused: No  :  Pertinent items are noted in HPI.  Exam: Blood pressure 117/75, pulse 86, temperature (!) 97.4 F (36.3 C), temperature source Tympanic, resp. rate 18, height _0  (1.803 m), weight 183 lb 3.2 oz (83.1 kg), SpO2 97 %.  ECOG 1  General appearance: alert and cooperative appeared without distress. Head: atraumatic without any abnormalities. Eyes: conjunctivae/corneas clear. PERRL.  Sclera anicteric. Throat: lips, mucosa, and tongue normal; without oral thrush or ulcers. Resp: clear to auscultation bilaterally without rhonchi, wheezes or dullness to percussion. Cardio: regular rate and rhythm, S1, S2 normal, no murmur, click, rub or gallop GI: soft, non-tender; bowel sounds normal; no masses,  no organomegaly Skin: Skin color, texture, turgor normal. No rashes or lesions Lymph nodes: Cervical, supraclavicular, and axillary nodes normal. Neurologic: Grossly normal without any motor, sensory or deep tendon  reflexes. Musculoskeletal: No joint deformity or effusion.      Assessment and Plan:   86 year old man with  1. Thrombocytopenia noted as far back as 2009.  His platelet count has fluctuated between normal range and mild decline.  His CBC in July 2021 showed a platelet count of 115 with normal white cell count and platelet count.  He did have mild eosinophilia.  The differential diagnosis for these findings were discussed at this time.  Given the chronic and mild fluctuating nature of his thrombocytopenia, the likelihood of a blood disorder is considered unlikely.  Autoimmune phenomenon such as ITP versus secondary thrombocytopenia remains the most likely etiology.  From a management standpoint, I do not believe a work-up is recommended at this time.  Bone marrow biopsy is not indicated based on these findings.  I recommended that continued active surveillance with periodic monitoring of his platelet count.  If his platelet count consistently declines below if 70,000, we will consider evaluation at that time.   2.  Follow-up: Happy to see him in the future as needed  45  minutes were dedicated to this visit. The time was spent on reviewing laboratory data, discussing treatment options, discussing differential diagnosis and  answering questions regarding future plan.      A copy of this consult has been forwarded to the requesting physician.

## 2020-08-16 NOTE — Telephone Encounter (Signed)
Will forward to [facility] to inform to schedule CT prior to November appt.

## 2020-08-16 NOTE — Telephone Encounter (Signed)
Daughter aware of appt

## 2020-08-21 ENCOUNTER — Other Ambulatory Visit: Payer: Self-pay | Admitting: Internal Medicine

## 2020-08-30 ENCOUNTER — Ambulatory Visit (HOSPITAL_COMMUNITY)
Admission: RE | Admit: 2020-08-30 | Discharge: 2020-08-30 | Disposition: A | Discharge status: Home / Self Care | Payer: Medicare Other | Source: Ambulatory Visit | Attending: Pulmonary Disease | Admitting: Pulmonary Disease

## 2020-08-30 ENCOUNTER — Other Ambulatory Visit: Payer: Self-pay

## 2020-08-30 DIAGNOSIS — R0600 Dyspnea, unspecified: Secondary | ICD-10-CM | POA: Diagnosis not present

## 2020-08-30 DIAGNOSIS — Z9189 Other specified personal risk factors, not elsewhere classified: Secondary | ICD-10-CM | POA: Insufficient documentation

## 2020-08-30 DIAGNOSIS — R0609 Other forms of dyspnea: Secondary | ICD-10-CM

## 2020-08-30 DIAGNOSIS — R0602 Shortness of breath: Secondary | ICD-10-CM | POA: Diagnosis not present

## 2020-08-30 DIAGNOSIS — Z79899 Other long term (current) drug therapy: Secondary | ICD-10-CM

## 2020-08-31 ENCOUNTER — Ambulatory Visit: Payer: Medicare Other | Admitting: Rheumatology

## 2020-09-06 ENCOUNTER — Ambulatory Visit (INDEPENDENT_AMBULATORY_CARE_PROVIDER_SITE_OTHER): Payer: Medicare Other

## 2020-09-06 DIAGNOSIS — Z95810 Presence of automatic (implantable) cardiac defibrillator: Secondary | ICD-10-CM

## 2020-09-06 DIAGNOSIS — I5042 Chronic combined systolic (congestive) and diastolic (congestive) heart failure: Secondary | ICD-10-CM

## 2020-09-06 NOTE — Progress Notes (Signed)
Office Visit Note  Patient: [name]             Date of Birth: May 06, 1931           MRN: [id number]             PCP: Caren Macadam, MD Referring: Caren Macadam, MD Visit Date: 09/20/2020 Occupation: @GUAROCC @  Subjective:  Medication management   History of Present Illness: [name] is a 86 y.o. male with history of osteoporosis, osteoarthritis and gout.  He states he has been taking Fosamax some on the weekly basis.  He denies any side effects with the medication.  He does not recall having a DEXA scan this year.  He denies having any gout flares.  He denies discomfort in his joints.  Activities of Daily Living:  Patient reports morning stiffness for 0 minutes.   Patient Denies nocturnal pain.  Difficulty dressing/grooming: Denies Difficulty climbing stairs: Denies Difficulty getting out of chair: Denies Difficulty using hands for taps, buttons, cutlery, and/or writing: Denies  Review of Systems  Constitutional: Negative for fatigue.  HENT: Positive for hearing loss and mouth dryness. Negative for mouth sores and nose dryness.   Eyes: Negative for pain, itching and dryness.  Respiratory: Positive for cough, shortness of breath and difficulty breathing.   Cardiovascular: Negative for chest pain and palpitations.  Gastrointestinal: Positive for constipation. Negative for blood in stool and diarrhea.  Endocrine: Negative for increased urination.  Genitourinary: Negative for difficulty urinating.  Musculoskeletal: Negative for arthralgias, joint pain, joint swelling, myalgias, morning stiffness, muscle tenderness and myalgias.  Skin: Positive for rash. Negative for color change and redness.  Allergic/Immunologic: Negative for susceptible to infections.  Neurological: Negative for dizziness, numbness, headaches, memory loss and weakness.  Hematological: Positive for bruising/bleeding tendency.  Psychiatric/Behavioral: Negative for confusion and  sleep disturbance.    PMFS History:  Patient Active Problem List   Diagnosis Date Noted  . Coronary artery disease involving native coronary artery of native heart without angina pectoris 09/30/2019  . Dyslipidemia 09/30/2019  . Educated about COVID-19 virus infection 03/20/2019  . VT (ventricular tachycardia) ([place]) 03/20/2019  . Chronic hypoxemic respiratory failure ([place]) 08/26/2018  . Gait abnormality 08/26/2018  . Lobar pneumonia, unspecified organism ([place]) 08/04/2018  . Lower GI bleeding 07/21/2018  . Forehead laceration, initial encounter 07/21/2018  . CKD (chronic kidney disease) stage 3, GFR 30-59 ml/min (HCC) 07/21/2018  . History of pneumonia 07/15/2018  . Physical deconditioning 11/13/2017  . Weakness 11/12/2017  . Falls 11/12/2017  . Pleural effusion   . CHF exacerbation ([place]) 09/30/2017  . Shingles outbreak 05/27/2017  . Seborrheic dermatitis 05/27/2017  . Acute on chronic respiratory failure with hypoxia ([place]) 01/13/2017  . COPD with acute bronchitis ([place]) 01/13/2017  . CAD S/P percutaneous coronary angioplasty   . Chronic combined systolic and diastolic CHF (congestive heart failure) ([place])   . Blunt chest trauma 05/04/2016  . 4.1 cm Aneurysm of thoracic aorta ([place]) 04/27/2016  . SIADH (syndrome of inappropriate ADH production) ([place])   . Orthostatic hypotension 04/12/2016  . TBI (traumatic brain injury) ([place])   . Acute on chronic combined systolic and diastolic congestive heart failure ([place])   . S/P ORIF (open reduction internal fixation) fracture   . Thrombocytopenia ([place])   . Urinary retention   . Slow transit constipation   . Thyroid activity decreased   . Traumatic subdural hematoma with loss of consciousness ([place])   . Pulmonary hypertension ([place])   . Cardiomyopathy,  ischemic   . Subdural hematoma ([place])   . Cardiac arrest ([place]) 03/29/2016  . 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 T wave over sensing resulting in inappropriate shocks  08/14/2013  . Vitamin D deficiency disease 04/09/2012  . Actinic skin damage 05/22/2011  . Alcohol use 03/22/2011  . Other dysphagia 02/16/2011  . Carotid arterial disease ([place]) 11/17/2009  . GOUT 09/23/2009  . Age-related osteoporosis with current pathological fracture 09/23/2009  . SYNCOPE 04/19/2009  . Automatic implantable cardioverter-defibrillator in situ 04/13/2008  . COLONIC POLYPS 12/09/2007  . Diverticulosis of colon 12/09/2007  . Essential hypertension 12/08/2007  . Peripheral vascular disease ([place]) 12/08/2007  . COPD GOLD III 12/08/2007  . Osteoarthritis 12/08/2007    Past Medical History:  Diagnosis Date  . AAA (abdominal aortic aneurysm) ([place])    a. 2002 s/p repair.  . Allergic rhinitis   . Back pain   . CAD (coronary artery disease)    a. 02/2002 H/o MI with stenting x 2; b. 04/2013 MV: EF 25%, large posterior lateral infarct w/o ischemia; c. 03/2016 VT Arrest/Cath: LM 60ost, LAD 40p/m ISR, LCX 100p/m, RCA nl-->Med Rx.  . Carotid arterial disease ([place])    a. 12/2010 s/p R CEA;  b. 05/2015 Carotid U/S: bilat <40% ICA stenosis.  . Chronic combined systolic and diastolic CHF (congestive heart failure) ([place])    a. 03/2016 Echo: Ef 35-40%, grade 1 DD.  Marland Kitchen Compression fracture   . COPD (chronic obstructive pulmonary disease) ([place])   . Dermatitis   . Diverticulosis of colon   . DJD (degenerative joint disease)    and Gout  . Hemorrhoids   . History of colonic polyps   . History of gout   . History of pneumonia   . Hypercholesterolemia   . Hypertension   . Hypertensive heart disease   . Ischemic cardiomyopathy    a. EF prev <35%-->improved to normal by Echo 8/14:  Mild LVH, focal basal hypertrophy, EF 60-65%, normal wall motion, mild BAE, PASP 36; c. 03/2016 Echo: EF 35-40%, Gr1 DD, triv AI, mild MR, mod dil LA, mild-mod TR, PASP 67mmHg.  . Osteoporosis   . Peripheral vascular disease ([place])   . Presence of cardiac defibrillator    a. 03/2008 s/p MDT [id number] Maximo II DR,  DC AICD; b. 03/2013: ICD shock for T wave oversensing;  c. 10/2015: collective decision not to replace ICD given improvement in LV fxn; d. VT Arrest-->Gen change to MDT ser # [id number].  . Skin cancer    shoulders and forehead  . Syncope   . T wave over sensing resulting in inappropriate shocks    a. 03/2013.  . Tobacco abuse   . Ventricular tachycardia ([place]) 04/01/2016    Family History  Problem Relation Age of Onset  . Hypertension Father   . Heart disease Father 50       Heart Disease before age 50  . Alcohol abuse Father        causing multiple medical problems; uncertain exact cause of death  . Hypertension Mother   . Heart disease Mother        Heart Disease before age 34  . Cancer Mother        pancreatic  . Scoliosis Sister   . Thyroid disease Daughter   . Anxiety disorder Son    Past Surgical History:  Procedure Laterality Date  . ABDOMINAL AORTIC ANEURYSM REPAIR  2002   by Dr. Kellie Simmering  . aicd placed  03/2008   Dr. Caryl Comes  . cad stent  02/2002   Dr. Percival Spanish  . CARDIAC CATHETERIZATION     X 2 stents  . CARDIAC CATHETERIZATION N/A 04/03/2016   Procedure: Left Heart Cath and Coronary Angiography;  Surgeon: Belva Crome, MD;  Location: [facility];  Service: Cardiovascular;  Laterality: N/A;  . CARDIAC DEFIBRILLATOR PLACEMENT  2009  . CARDIAC DEFIBRILLATOR PLACEMENT    . CAROTID ENDARTERECTOMY Right January 02, 2011  . EAR CYST EXCISION Left 12/13/2015   Procedure: Excision left ear lesion ;  Surgeon: Melissa Montane, MD;  Location: [facility];  Service: ENT;  Laterality: Left;  . EP IMPLANTABLE DEVICE N/A 04/06/2016   Procedure:  ICD Generator Changeout;  Surgeon: Deboraha Sprang, MD;  Location: [facility];  Service: Cardiovascular;  Laterality: N/A;  . EXCISION OF LESION LEFT EAR Left 12/13/2015  . EXTERNAL EAR SURGERY Left    Cancer Removal from left ear  . I & D EXTREMITY Left 04/23/2016   Procedure: IRRIGATION AND DEBRIDEMENT HAND;  Surgeon: Leandrew Koyanagi, MD;   Location: [facility];  Service: Orthopedics;  Laterality: Left;  . IR IMAGE GUIDED DRAINAGE BY PERCUTANEOUS CATHETER  10/08/2017  . lumbar back     done after compression fracture  . OPEN REDUCTION INTERNAL FIXATION (ORIF) METACARPAL Left 04/04/2016   Procedure: OPEN REDUCTION INTERNAL FIXATION (ORIF) LEFT 2ND, 3RD, 4TH METACARPAL FRACTURE;  Surgeon: Leandrew Koyanagi, MD;  Location: [facility];  Service: Orthopedics;  Laterality: Left;  OPEN REDUCTION INTERNAL FIXATION (ORIF) LEFT 2ND, 3RD, 4TH METACARPAL FRACTURE  . OPEN REDUCTION INTERNAL FIXATION (ORIF) METACARPAL Left 04/23/2016   Procedure: REVISION OPEN REDUCTION INTERNAL FIXATION (ORIF) 2ND METACARPAL;  Surgeon: Leandrew Koyanagi, MD;  Location: [facility];  Service: Orthopedics;  Laterality: Left;  . right corotid enderectomy  12/2010   Dr. Scot Dock  . SKIN SPLIT GRAFT Left 12/13/2015   Procedure: with possible skin graft;  Surgeon: Melissa Montane, MD;  Location: [facility];  Service: ENT;  Laterality: Left;  . TONSILLECTOMY     Social History   Social History Narrative   ** Merged History Encounter **       Lives locally.  Had been living with wife who had become quite ill recently and died on the evening of Apr 03, 2016.   Immunization History  Administered Date(s) Administered  . Fluad Quad(high Dose 65+) 07/31/2019, 07/25/2020  . H1N1 10/26/2008  . Influenza Split 08/23/2011, 07/15/2012  . Influenza Whole 07/14/2008, 08/22/2009, 09/04/2010  . Influenza, High Dose Seasonal PF 07/09/2016, 09/18/2017, 07/31/2018  . Influenza,inj,Quad PF,6+ Mos 07/07/2013, 07/07/2014, 07/23/2015  . PFIZER SARS-COV-2 Vaccination 11/12/2019, 12/03/2019, 07/26/2020  . Pneumococcal Conjugate-13 10/03/2015  . Pneumococcal Polysaccharide-23 07/12/2009  . Tdap 02/10/2018     Objective: Vital Signs: BP 122/74 (BP Location: Left Arm, Patient Position: Sitting, Cuff Size: Normal)   Pulse 75   Resp 17   Ht 5\' 11"  (1.803 m)   Wt 182 lb (82.6 kg)   BMI 25.38 kg/m    Physical  Exam Vitals and nursing note reviewed.  Constitutional:      Appearance: He is well-developed.  HENT:     Head: Normocephalic and atraumatic.  Eyes:     Conjunctiva/sclera: Conjunctivae normal.     Pupils: Pupils are equal, round, and reactive to light.  Cardiovascular:     Rate and Rhythm: Normal rate and regular rhythm.     Heart sounds: Normal heart sounds.  Pulmonary:  Effort: Pulmonary effort is normal.     Breath sounds: Normal breath sounds.  Abdominal:     General: Bowel sounds are normal.     Palpations: Abdomen is soft.  Musculoskeletal:     Cervical back: Normal range of motion and neck supple.  Skin:    General: Skin is warm and dry.     Capillary Refill: Capillary refill takes less than 2 seconds.  Neurological:     Mental Status: He is alert and oriented to person, place, and time.  Psychiatric:        Behavior: Behavior normal.      Musculoskeletal Exam: He had good range of motion of his C-spine without discomfort.  He has some thoracic kyphosis.  He had no tenderness over lumbar spine.  Shoulder joints, elbow joints, wrist joints with good range of motion.  He has some PIP and DIP thickening without synovitis.  Hip joints were difficult to assess to in the sitting position.  Knee joints with good range of motion with no swelling.  He had no tenderness over ankles or MTPs.   CDAI Exam: CDAI Score: -- Patient Global: --; Provider Global: -- Swollen: --; Tender: -- Joint Exam 09/20/2020   No joint exam has been documented for this visit   There is currently no information documented on the homunculus. Go to the Rheumatology activity and complete the homunculus joint exam.  Investigation: No additional findings.  Imaging: CT CHEST HIGH RESOLUTION  Result Date: 08/31/2020 CLINICAL DATA:  Shortness of breath on exertion. At risk for amiodarone toxicity with long-term use. EXAM: CT CHEST WITHOUT CONTRAST TECHNIQUE: Multidetector CT imaging of the chest  was performed following the standard protocol without intravenous contrast. High resolution imaging of the lungs, as well as inspiratory and expiratory imaging, was performed. COMPARISON:  07/21/2018. FINDINGS: Cardiovascular: Atherosclerotic calcification of the aorta, aortic valve and coronary arteries. Ascending aorta measures 4.2 cm, increased from 4.0 cm. Pulmonic trunk and heart are enlarged. No pericardial effusion. Mediastinum/Nodes: No pathologically enlarged mediastinal or axillary lymph nodes. Hilar regions are difficult to definitively evaluate without IV contrast. Esophagus is grossly unremarkable. Lungs/Pleura: Centrilobular and paraseptal emphysema. Scattered pulmonary parenchymal scarring, most notable in the medial right lower lobe. Volume loss in the inferior lingula. Negative for subpleural reticulation, traction bronchiectasis, ground-glass, architectural distortion or honeycombing. Subpleural reticular densities in the inferior lateral right middle lobe and right lower lobe may be postinflammatory in etiology when compared with 07/21/2018. No pleural fluid. Airway is unremarkable. No air trapping. Upper Abdomen: Liver is increased in attenuation, consistent with the given history of amiodarone use. Visualized portions of the liver, gallbladder, adrenal glands, spleen, pancreas, stomach and bowel are otherwise unremarkable. No upper abdominal adenopathy. Atherosclerotic calcification of the aorta with a saccular aneurysm at the diaphragmatic hiatus and overall diameter of 4.5 cm, likely similar to 07/21/2018. Musculoskeletal: Degenerative changes in the spine. T12 vertebral body augmentation. Old rib fractures. No worrisome lytic or sclerotic lesions. T10 compression deformity, unchanged. IMPRESSION: 1. No definitive evidence of interstitial lung disease or amiodarone toxicity. 2. Ascending Aortic aneurysm NOS (ICD10-I71.9), minimally increased from 07/21/2018. Recommend annual imaging followup  by CTA or MRA. This recommendation follows 2010 ACCF/AHA/AATS/ACR/ASA/SCA/SCAI/SIR/STS/SVM Guidelines for the Diagnosis and Management of Patients with Thoracic Aortic Disease. Circulation. 2010; 121: H371-I967. Aortic aneurysm NOS (ICD10-I71.9). 3. Aortic atherosclerosis (ICD10-I70.0). Coronary artery calcification. 4. Enlarged pulmonic trunk, indicative of pulmonary arterial hypertension. 5.  Emphysema (ICD10-J43.9). Electronically Signed   By: Lorin Picket M.D.   On:  08/31/2020 12:45    Recent Labs: Lab Results  Component Value Date   WBC 9.4 05/11/2020   HGB 13.4 05/11/2020   PLT 115 (L) 05/11/2020   NA 140 07/01/2020   K 4.9 07/01/2020   CL 98 07/01/2020   CO2 26 07/01/2020   GLUCOSE 85 07/01/2020   BUN 21 07/01/2020   CREATININE 1.39 (H) 07/01/2020   BILITOT 0.4 06/15/2020   ALKPHOS 121 06/15/2020   AST 35 06/15/2020   ALT 20 06/15/2020   PROT 7.7 06/15/2020   ALBUMIN 4.1 06/15/2020   CALCIUM 9.9 07/01/2020   GFRAA 52 (L) 07/01/2020    Speciality Comments: No specialty comments available.  Procedures:  No procedures performed Allergies: Lisinopril   Assessment / Plan:     Visit Diagnoses: Age-related osteoporosis with current pathological fracture with routine healing, subsequent encounter - DEXA on 02/17/2018 right femoral neck T score -2.7 with an increase of 11.4% when compared to previous DEXA.  He is taking Fosamax 70 mg 1 tablet by mouth once a week.  He states been tolerating medication well without any side effects.  I do not see a repeat DEXA done which was ordered by his PCP for May 2021.  I have advised him to contact the by radiology to get DEXA.  He has been taking calcium and vitamin D.  Resistive exercises were emphasized.  Medication monitoring encounter - Plan: CBC with Differential/Platelet, COMPLETE METABOLIC PANEL WITH GFR today.-  Vitamin D deficiency disease-his vitamin D was normal earlier this year.  Closed compression fracture of L3 lumbar  vertebra, sequela-he is currently not having any lower back discomfort.  Idiopathic chronic gout of multiple sites without tophus - Allopurinol 100 mg daily, prescribed by his cardiologist.  He denies having any gout flare.  Other medical problems are listed as follows:  History of COPD-he denies shortness of breath.  Automatic implantable cardioverter-defibrillator in situ  COPD GOLD III  History of diverticulosis  SIADH (syndrome of inappropriate ADH production) (HCC)  Peripheral vascular disease (HCC)  Basal cell carcinoma (BCC) of auricle of left ear  CAD S/P percutaneous coronary angioplasty  Essential hypertension  Cardiomyopathy, ischemic  Chronic combined systolic and diastolic CHF (congestive heart failure) (HCC)  Pulmonary hypertension (HCC)  History of subdural hematoma  History of traumatic brain injury  Former smoker  Orders: Orders Placed This Encounter  Procedures  . CBC with Differential/Platelet  . COMPLETE METABOLIC PANEL WITH GFR   No orders of the defined types were placed in this encounter.    Follow-Up Instructions: Return in about 6 months (around 03/20/2021) for Osteoporosis.   Bo Merino, MD  Note - This record has been created using Editor, commissioning.  Chart creation errors have been sought, but may not always  have been located. Such creation errors do not reflect on  the standard of medical care.

## 2020-09-07 NOTE — Progress Notes (Signed)
EPIC Encounter for ICM Monitoring  Patient Name: [name] is a 86 y.o. male Date: 09/07/2020 Primary Care Physican: Caren Macadam, MD Primary Cardiologist: [place] Electrophysiologist: Faustino Congress Weight:unknown  Transmission reviewed.  Optivol thoracic impedancenormal.  Prescribed:  Furosemide20 mg 1 tabletdaily.  Labs: 07/01/2020 Creatinine 1.39, BUN 21, Potassium 4.9, Sodium 140, GFR 45-52 06/15/2020 Creatinine 1.33, BUN 20, Potassium 5.5, Sodium 140, GFR 48-55  A complete set of results can be found in Results Review.  Recommendations:No changes  Follow-up plan: ICM clinic phone appointment on12/20/2021. 91 day device clinic remote transmission 11/02/2020.   EP/Cardiology Office Visits:01/24/2021 with Dr.Klein.  Copy of ICM check sent to [facility]  3 month ICM trend: 09/06/2020    1 Year ICM trend:       [name], RN 09/07/2020 11:28 AM

## 2020-09-08 ENCOUNTER — Other Ambulatory Visit: Payer: Self-pay

## 2020-09-08 ENCOUNTER — Encounter: Payer: Self-pay | Admitting: Pulmonary Disease

## 2020-09-08 ENCOUNTER — Ambulatory Visit (INDEPENDENT_AMBULATORY_CARE_PROVIDER_SITE_OTHER): Payer: Medicare Other | Admitting: Pulmonary Disease

## 2020-09-08 VITALS — BP 128/80 | HR 72 | Temp 97.3°F | Wt 179.2 lb

## 2020-09-08 DIAGNOSIS — Z79899 Other long term (current) drug therapy: Secondary | ICD-10-CM

## 2020-09-08 DIAGNOSIS — I5042 Chronic combined systolic (congestive) and diastolic (congestive) heart failure: Secondary | ICD-10-CM | POA: Diagnosis not present

## 2020-09-08 DIAGNOSIS — J449 Chronic obstructive pulmonary disease, unspecified: Secondary | ICD-10-CM

## 2020-09-08 DIAGNOSIS — R0609 Other forms of dyspnea: Secondary | ICD-10-CM

## 2020-09-08 DIAGNOSIS — R0600 Dyspnea, unspecified: Secondary | ICD-10-CM | POA: Diagnosis not present

## 2020-09-08 DIAGNOSIS — Z9189 Other specified personal risk factors, not elsewhere classified: Secondary | ICD-10-CM

## 2020-09-08 NOTE — Patient Instructions (Signed)
Thank you for visiting Dr. Valeta Harms at [facility]. Today we recommend the following:  Continue your current nebulizer regimen   Return in about 1 year (around 09/08/2021) for with APP or Dr. Valeta Harms.    Please do your part to reduce the spread of COVID-19.
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
## 2020-09-19 ENCOUNTER — Telehealth: Payer: Self-pay | Admitting: Pulmonary Disease

## 2020-09-19 MED ORDER — DOXYCYCLINE HYCLATE 100 MG PO TABS: 100.0000 mg | ORAL_TABLET | Freq: Two times a day (BID) | ORAL | 0 refills | Status: DC

## 2020-09-19 NOTE — Telephone Encounter (Signed)
LMTC x 1  

## 2020-09-19 NOTE — Telephone Encounter (Signed)
Patient's daughter, Cindy(DPR) is aware of recommendations and voiced her understanding.  Rx for doxy has been sent to preferred pharmacy.  [name] has been provided with cones testing sites contact number. Nothing further needed.

## 2020-09-19 NOTE — Telephone Encounter (Signed)
Recommend COVID-19 testing  Patient recently seen in the office has severe COPD Continue with maintenance regimen. Mucinex DM twice daily as needed for cough and congestion  May begin doxycycline 100 mg twice daily for 1 week, take with food.  If symptoms or not improving will need office visit for further evaluation  Please contact office for sooner follow up if symptoms do not improve or worsen or seek emergency care

## 2020-09-19 NOTE — Telephone Encounter (Signed)
Spoke to pt's daughter. She reports pt having increased cough and chest congestion for past 2 days. Some diff sleeping due to cough. Unsure of color of mucus. Increased SOB. Denies FCS. They are concerned due to pt;s h/o pneumonia.  Please advise.

## 2020-09-20 ENCOUNTER — Ambulatory Visit (INDEPENDENT_AMBULATORY_CARE_PROVIDER_SITE_OTHER): Payer: Medicare Other | Admitting: Rheumatology

## 2020-09-20 ENCOUNTER — Encounter: Payer: Self-pay | Admitting: Rheumatology

## 2020-09-20 ENCOUNTER — Other Ambulatory Visit: Payer: Self-pay

## 2020-09-20 ENCOUNTER — Other Ambulatory Visit: Payer: Medicare Other

## 2020-09-20 VITALS — BP 122/74 | HR 75 | Resp 17 | Ht 71.0 in | Wt 182.0 lb

## 2020-09-20 DIAGNOSIS — M8000XD Age-related osteoporosis with current pathological fracture, unspecified site, subsequent encounter for fracture with routine healing: Secondary | ICD-10-CM

## 2020-09-20 DIAGNOSIS — I5042 Chronic combined systolic (congestive) and diastolic (congestive) heart failure: Secondary | ICD-10-CM

## 2020-09-20 DIAGNOSIS — E222 Syndrome of inappropriate secretion of antidiuretic hormone: Secondary | ICD-10-CM | POA: Diagnosis not present

## 2020-09-20 DIAGNOSIS — I255 Ischemic cardiomyopathy: Secondary | ICD-10-CM

## 2020-09-20 DIAGNOSIS — Z9861 Coronary angioplasty status: Secondary | ICD-10-CM

## 2020-09-20 DIAGNOSIS — C44219 Basal cell carcinoma of skin of left ear and external auricular canal: Secondary | ICD-10-CM | POA: Diagnosis not present

## 2020-09-20 DIAGNOSIS — S32030S Wedge compression fracture of third lumbar vertebra, sequela: Secondary | ICD-10-CM

## 2020-09-20 DIAGNOSIS — Z8709 Personal history of other diseases of the respiratory system: Secondary | ICD-10-CM

## 2020-09-20 DIAGNOSIS — Z20822 Contact with and (suspected) exposure to covid-19: Secondary | ICD-10-CM | POA: Diagnosis not present

## 2020-09-20 DIAGNOSIS — E559 Vitamin D deficiency, unspecified: Secondary | ICD-10-CM

## 2020-09-20 DIAGNOSIS — Z5181 Encounter for therapeutic drug level monitoring: Secondary | ICD-10-CM | POA: Diagnosis not present

## 2020-09-20 DIAGNOSIS — Z8719 Personal history of other diseases of the digestive system: Secondary | ICD-10-CM | POA: Diagnosis not present

## 2020-09-20 DIAGNOSIS — J449 Chronic obstructive pulmonary disease, unspecified: Secondary | ICD-10-CM

## 2020-09-20 DIAGNOSIS — Z95810 Presence of automatic (implantable) cardiac defibrillator: Secondary | ICD-10-CM

## 2020-09-20 DIAGNOSIS — I739 Peripheral vascular disease, unspecified: Secondary | ICD-10-CM

## 2020-09-20 DIAGNOSIS — Z87820 Personal history of traumatic brain injury: Secondary | ICD-10-CM

## 2020-09-20 DIAGNOSIS — I10 Essential (primary) hypertension: Secondary | ICD-10-CM

## 2020-09-20 DIAGNOSIS — I2510 Atherosclerotic heart disease of native coronary artery without angina pectoris: Secondary | ICD-10-CM

## 2020-09-20 DIAGNOSIS — M1A09X0 Idiopathic chronic gout, multiple sites, without tophus (tophi): Secondary | ICD-10-CM

## 2020-09-20 DIAGNOSIS — I272 Pulmonary hypertension, unspecified: Secondary | ICD-10-CM

## 2020-09-20 DIAGNOSIS — Z87891 Personal history of nicotine dependence: Secondary | ICD-10-CM

## 2020-09-20 DIAGNOSIS — Z8679 Personal history of other diseases of the circulatory system: Secondary | ICD-10-CM

## 2020-09-20 NOTE — Patient Instructions (Addendum)
Please call [facility] to schedule bone density scan. [phone]

## 2020-09-21 LAB — CBC WITH DIFFERENTIAL/PLATELET
Absolute Monocytes: 603 cells/uL (ref 200–950)
Basophils Absolute: 81 cells/uL (ref 0–200)
Basophils Relative: 0.9 %
Eosinophils Absolute: 2241 cells/uL — ABNORMAL HIGH (ref 15–500)
Eosinophils Relative: 24.9 %
HCT: 42.2 % (ref 38.5–50.0)
Hemoglobin: 14.3 g/dL (ref 13.2–17.1)
Lymphs Abs: 1692 cells/uL (ref 850–3900)
MCH: 33 pg (ref 27.0–33.0)
MCHC: 33.9 g/dL (ref 32.0–36.0)
MCV: 97.5 fL (ref 80.0–100.0)
MPV: 10.8 fL (ref 7.5–12.5)
Monocytes Relative: 6.7 %
Neutro Abs: 4383 cells/uL (ref 1500–7800)
Neutrophils Relative %: 48.7 %
Platelets: 127 10*3/uL — ABNORMAL LOW (ref 140–400)
RBC: 4.33 10*6/uL (ref 4.20–5.80)
RDW: 13.3 % (ref 11.0–15.0)
Total Lymphocyte: 18.8 %
WBC: 9 10*3/uL (ref 3.8–10.8)

## 2020-09-21 LAB — COMPLETE METABOLIC PANEL WITH GFR
AG Ratio: 1.2 (calc) (ref 1.0–2.5)
ALT: 17 U/L (ref 9–46)
AST: 26 U/L (ref 10–35)
Albumin: 3.8 g/dL (ref 3.6–5.1)
Alkaline phosphatase (APISO): 93 U/L (ref 35–144)
BUN/Creatinine Ratio: 17 (calc) (ref 6–22)
BUN: 21 mg/dL (ref 7–25)
CO2: 29 mmol/L (ref 20–32)
Calcium: 9.3 mg/dL (ref 8.6–10.3)
Chloride: 103 mmol/L (ref 98–110)
Creat: 1.23 mg/dL — ABNORMAL HIGH (ref 0.70–1.11)
GFR, Est African American: 61 mL/min/{1.73_m2} (ref >=60)
GFR, Est Non African American: 52 mL/min/{1.73_m2} — ABNORMAL LOW (ref >=60)
Globulin: 3.3 g/dL (calc) (ref 1.9–3.7)
Glucose, Bld: 87 mg/dL (ref 65–139)
Potassium: 4.4 mmol/L (ref 3.5–5.3)
Sodium: 140 mmol/L (ref 135–146)
Total Bilirubin: 0.4 mg/dL (ref 0.2–1.2)
Total Protein: 7.1 g/dL (ref 6.1–8.1)

## 2020-09-21 LAB — SARS-COV-2, NAA 2 DAY TAT

## 2020-09-21 LAB — NOVEL CORONAVIRUS, NAA: SARS-CoV-2, NAA: NOT DETECTED

## 2020-09-21 NOTE — Progress Notes (Signed)
Platelets are low and is stable.  GFR is low and stable.

## 2020-09-23 ENCOUNTER — Telehealth: Payer: Self-pay | Admitting: Pulmonary Disease

## 2020-09-23 MED ORDER — YUPELRI 175 MCG/3ML IN SOLN: 175.0000 ug | Freq: Every day | RESPIRATORY_TRACT | 0 refills | Status: DC

## 2020-09-23 NOTE — Telephone Encounter (Signed)
Called and spoke with patient's aide who states that patient is going to be out of his Maretta Bees after tonight and that they ordered more but it hasn't come in yet. Informed her that we are limited on options since it was Friday after 5pm and that if she could get here in the next 10 minutes I would meet her at the door with samples. She said she would come and get them. She has picked up samples. Nothing further needed at this time.

## 2020-09-26 ENCOUNTER — Telehealth: Payer: Self-pay | Admitting: Pulmonary Disease

## 2020-09-27 ENCOUNTER — Other Ambulatory Visit: Payer: Self-pay

## 2020-09-27 ENCOUNTER — Ambulatory Visit (INDEPENDENT_AMBULATORY_CARE_PROVIDER_SITE_OTHER)
Admission: RE | Admit: 2020-09-27 | Discharge: 2020-09-27 | Disposition: A | Discharge status: Home / Self Care | Payer: Medicare Other | Source: Ambulatory Visit | Attending: Family Medicine | Admitting: Family Medicine

## 2020-09-27 DIAGNOSIS — M810 Age-related osteoporosis without current pathological fracture: Secondary | ICD-10-CM | POA: Diagnosis not present

## 2020-09-27 MED ORDER — FORMOTEROL FUMARATE 20 MCG/2ML IN NEBU: 20.0000 ug | INHALATION_SOLUTION | Freq: Two times a day (BID) | RESPIRATORY_TRACT | 0 refills | Status: DC

## 2020-09-27 NOTE — Telephone Encounter (Signed)
Patient and caregiver came to clinic asking for Perforomist samples.  Samples have been given.  Nothing further needed at this time- will close encounter.

## 2020-09-27 NOTE — Telephone Encounter (Signed)
ATC x1, advised that I needed to speak with his daughter and she would not be in until 12 pm.  Will attempt to call back later.

## 2020-09-30 NOTE — Progress Notes (Signed)
Significant decrease in BMD noted.  We can discuss switching to Prolia at the follow-up visit.  If patient wants to switch earlier please schedule next available nonurgent appointment.

## 2020-10-10 ENCOUNTER — Ambulatory Visit (INDEPENDENT_AMBULATORY_CARE_PROVIDER_SITE_OTHER): Payer: Medicare Other

## 2020-10-10 DIAGNOSIS — Z95810 Presence of automatic (implantable) cardiac defibrillator: Secondary | ICD-10-CM | POA: Diagnosis not present

## 2020-10-10 DIAGNOSIS — I5042 Chronic combined systolic (congestive) and diastolic (congestive) heart failure: Secondary | ICD-10-CM | POA: Diagnosis not present

## 2020-10-10 NOTE — Progress Notes (Signed)
EPIC Encounter for ICM Monitoring  Patient Name: [name] is a 86 y.o. male Date: 10/10/2020 Primary Care Physican: Caren Macadam, MD Primary Cardiologist: [place] Electrophysiologist: Faustino Congress Weight:unknown  Transmission reviewed.  Optivol thoracic impedancenormal.  Prescribed:  Furosemide20 mg 1 tabletdaily.  Labs: 09/20/2021 Creatinine 1.23, BUN 21, Potassium 4.4, Sodium 140, GFR 52-61 07/01/2020 Creatinine1.39, BUN21, Potassium4.9, Sodium140, GFR45-52 06/15/2020 Creatinine1.33, BUN20, Potassium5.5, Sodium140, HNP67-22 A complete set of results can be found in Results Review.  Recommendations:No changes  Follow-up plan: ICM clinic phone appointment on2/04/2021. 91 day device clinic remote transmission1/09/2021.   EP/Cardiology Office Visits:4/5/2022with Dr.Klein.  Copy of ICM check sent to [facility]  3 month ICM trend: 10/10/2020    1 Year ICM trend:       [name], RN 10/10/2020 12:34 PM

## 2020-10-12 MED ORDER — DOXYCYCLINE HYCLATE 100 MG PO TABS: 100.0000 mg | ORAL_TABLET | Freq: Two times a day (BID) | ORAL | 0 refills | Status: DC

## 2020-10-12 NOTE — Telephone Encounter (Signed)
BW please advise since BI is out of the office today.  Thanks   Last ov with BI was 09/08/20  Last abx given was doxycycline 100  1 po bid  #14 on 09/18/2020.    Next ov is scheduled for:   No pending appts with BI

## 2020-10-12 NOTE — Telephone Encounter (Signed)
I sent in a course of doxycycline. Take mucinex 600 twice daily. Call if not better

## 2020-11-02 ENCOUNTER — Ambulatory Visit (INDEPENDENT_AMBULATORY_CARE_PROVIDER_SITE_OTHER): Payer: Medicare Other

## 2020-11-02 DIAGNOSIS — R55 Syncope and collapse: Secondary | ICD-10-CM

## 2020-11-02 LAB — CUP PACEART REMOTE DEVICE CHECK
Battery Remaining Longevity: 40 mo
Battery Voltage: 2.96 V
Brady Statistic AP VP Percent: 0.07 %
Brady Statistic AP VS Percent: 99.16 %
Brady Statistic AS VP Percent: 0.01 %
Brady Statistic AS VS Percent: 0.77 %
Brady Statistic RA Percent Paced: 99.21 %
Brady Statistic RV Percent Paced: 0.08 %
Date Time Interrogation Session: 20220112022602
HighPow Impedance: 45 Ohm
HighPow Impedance: 62 Ohm
Implantable Lead Implant Date: 20090623
Implantable Lead Implant Date: 20090623
Implantable Lead Location: 753859
Implantable Lead Location: 753860
Implantable Lead Model: 5076
Implantable Lead Model: 6947
Implantable Pulse Generator Implant Date: 20170616
Lead Channel Impedance Value: 304 Ohm
Lead Channel Impedance Value: 342 Ohm
Lead Channel Impedance Value: 418 Ohm
Lead Channel Pacing Threshold Amplitude: 0.875 V
Lead Channel Pacing Threshold Amplitude: 1.125 V
Lead Channel Pacing Threshold Pulse Width: 0.4 ms
Lead Channel Pacing Threshold Pulse Width: 0.4 ms
Lead Channel Sensing Intrinsic Amplitude: 0.625 mV
Lead Channel Sensing Intrinsic Amplitude: 0.625 mV
Lead Channel Sensing Intrinsic Amplitude: 7.875 mV
Lead Channel Sensing Intrinsic Amplitude: 7.875 mV
Lead Channel Setting Pacing Amplitude: 2.5 V
Lead Channel Setting Pacing Amplitude: 2.5 V
Lead Channel Setting Pacing Pulse Width: 0.4 ms
Lead Channel Setting Sensing Sensitivity: 0.6 mV

## 2020-11-15 ENCOUNTER — Other Ambulatory Visit: Payer: Self-pay

## 2020-11-15 NOTE — Progress Notes (Signed)
Remote ICD transmission.   

## 2020-11-16 ENCOUNTER — Ambulatory Visit (INDEPENDENT_AMBULATORY_CARE_PROVIDER_SITE_OTHER): Payer: Medicare Other | Admitting: Family Medicine

## 2020-11-16 ENCOUNTER — Encounter: Payer: Self-pay | Admitting: Family Medicine

## 2020-11-16 VITALS — BP 138/80 | HR 93 | Temp 97.6°F | Ht 71.0 in | Wt 179.9 lb

## 2020-11-16 DIAGNOSIS — I10 Essential (primary) hypertension: Secondary | ICD-10-CM | POA: Diagnosis not present

## 2020-11-16 DIAGNOSIS — I779 Disorder of arteries and arterioles, unspecified: Secondary | ICD-10-CM

## 2020-11-16 DIAGNOSIS — J449 Chronic obstructive pulmonary disease, unspecified: Secondary | ICD-10-CM | POA: Diagnosis not present

## 2020-11-16 DIAGNOSIS — I255 Ischemic cardiomyopathy: Secondary | ICD-10-CM

## 2020-11-16 DIAGNOSIS — M1A0790 Idiopathic chronic gout, unspecified ankle and foot, without tophus (tophi): Secondary | ICD-10-CM

## 2020-11-16 DIAGNOSIS — E785 Hyperlipidemia, unspecified: Secondary | ICD-10-CM

## 2020-11-16 DIAGNOSIS — E039 Hypothyroidism, unspecified: Secondary | ICD-10-CM

## 2020-11-16 DIAGNOSIS — E559 Vitamin D deficiency, unspecified: Secondary | ICD-10-CM | POA: Diagnosis not present

## 2020-11-16 DIAGNOSIS — H10503 Unspecified blepharoconjunctivitis, bilateral: Secondary | ICD-10-CM

## 2020-11-16 DIAGNOSIS — N183 Chronic kidney disease, stage 3 unspecified: Secondary | ICD-10-CM

## 2020-11-16 LAB — CBC WITH DIFFERENTIAL/PLATELET
Basophils Absolute: 0.1 10*3/uL (ref 0.0–0.1)
Basophils Relative: 0.6 % (ref 0.0–3.0)
Eosinophils Absolute: 2.4 10*3/uL — ABNORMAL HIGH (ref 0.0–0.7)
Eosinophils Relative: 29.5 % — ABNORMAL HIGH (ref 0.0–5.0)
HCT: 44.1 % (ref 39.0–52.0)
Hemoglobin: 15.3 g/dL (ref 13.0–17.0)
Lymphocytes Relative: 15.1 % (ref 12.0–46.0)
Lymphs Abs: 1.2 10*3/uL (ref 0.7–4.0)
MCHC: 34.6 g/dL (ref 30.0–36.0)
MCV: 97.5 fl (ref 78.0–100.0)
Monocytes Absolute: 0.5 10*3/uL (ref 0.1–1.0)
Monocytes Relative: 5.7 % (ref 3.0–12.0)
Neutro Abs: 3.9 10*3/uL (ref 1.4–7.7)
Neutrophils Relative %: 49.1 % (ref 43.0–77.0)
Platelets: 130 10*3/uL — ABNORMAL LOW (ref 150.0–400.0)
RBC: 4.52 Mil/uL (ref 4.22–5.81)
RDW: 14.6 % (ref 11.5–15.5)
WBC: 8 10*3/uL (ref 4.0–10.5)

## 2020-11-16 LAB — COMPREHENSIVE METABOLIC PANEL
ALT: 20 U/L (ref 0–53)
AST: 36 U/L (ref 0–37)
Albumin: 4 g/dL (ref 3.5–5.2)
Alkaline Phosphatase: 93 U/L (ref 39–117)
BUN: 21 mg/dL (ref 6–23)
CO2: 30 mEq/L (ref 19–32)
Calcium: 9.5 mg/dL (ref 8.4–10.5)
Chloride: 100 mEq/L (ref 96–112)
Creatinine, Ser: 1.19 mg/dL (ref 0.40–1.50)
GFR: 54.85 mL/min — ABNORMAL LOW (ref >60.00)
Glucose, Bld: 80 mg/dL (ref 70–99)
Potassium: 4.2 mEq/L (ref 3.5–5.1)
Sodium: 137 mEq/L (ref 135–145)
Total Bilirubin: 0.6 mg/dL (ref 0.2–1.2)
Total Protein: 7.8 g/dL (ref 6.0–8.3)

## 2020-11-16 LAB — TSH: TSH: 4.32 u[IU]/mL (ref 0.35–4.50)

## 2020-11-16 LAB — LIPID PANEL
Cholesterol: 180 mg/dL (ref 0–200)
HDL: 36.1 mg/dL — ABNORMAL LOW (ref >39.00)
NonHDL: 143.98
Total CHOL/HDL Ratio: 5
Triglycerides: 315 mg/dL — ABNORMAL HIGH (ref 0.0–149.0)
VLDL: 63 mg/dL — ABNORMAL HIGH (ref 0.0–40.0)

## 2020-11-16 LAB — LDL CHOLESTEROL, DIRECT: Direct LDL: 97 mg/dL

## 2020-11-16 LAB — VITAMIN D 25 HYDROXY (VIT D DEFICIENCY, FRACTURES): VITD: 40.9 ng/mL (ref 30.00–100.00)

## 2020-11-16 LAB — URIC ACID: Uric Acid, Serum: 6 mg/dL (ref 4.0–7.8)

## 2020-11-16 MED ORDER — ERYTHROMYCIN 5 MG/GM OP OINT: 1.0000 "application " | TOPICAL_OINTMENT | Freq: Two times a day (BID) | OPHTHALMIC | 0 refills | Status: AC

## 2020-11-16 NOTE — Progress Notes (Signed)
[name] DOB: 25-Aug-1931 Encounter date: 11/16/2020  This is a 87 y.o. male who presents with Chief Complaint  Patient presents with  . Follow-up    History of present illness:  Last visit with me was 11/2019.   Following with Dr. Estanislado Pandy for osteoporosis and gout.  had significant decrease in bone density and sh made note to discuss switching to prolia at next visit. He has been stable on Fosamax and allopurinol for this.  Following with Dr. Percival Spanish for history of AAA repair in 2002, coronary artery disease, combined systolic and diastolic heart failure, ICM status post ICD 2009, hypertension, hyperlipidemia. Does check pressure BID at home - after exercise is higher up to 140-150/90's, but otherwise usually 110/70-80. Generally around 120/70. Does exercise routine still; same as he has for years. Occasional dizziness with position changes.   COPD; O2 depdt resp failure: on triple therapy neb inhaler. Off chronic prednisone. CT chest stable per pulm. Improved symptomatic COPD. Uses oxygen just at night when sleeping.   hypothyroid: stable on bloodwork 6 months ago. Due for recheck. Goal high normal tsh range due to osteoporosis.    Sleeping well through the night. Can wake once at night.   No problems with urination.   Sporadic bowel movements. If he doesn't take laxative, then gets constipated, but manages well with this. Usually goes just once a week without it and has a lot at that time. miralax really helps keeps things more regular just takes 1-2 times/week.   Appetite is very good.     Allergies  Allergen Reactions  . Lisinopril     BP dropped too low per daughter   Current Meds  Medication Sig  . alendronate (FOSAMAX) 70 MG tablet TAKE 1 TAB ONCE A WEEK, AT LEAST 30 MIN BEFORE 1ST FOOD.DO NOT LIE DOWN FOR 30 MIN AFTER TAKING.  Marland Kitchen allopurinol (ZYLOPRIM) 100 MG tablet TAKE 1 TABLET ONCE DAILY.  Marland Kitchen amiodarone (PACERONE) 200 MG tablet TAKE 1 TABLET (200MG )  BY MOUTH 5 DAYS PER WEEK.  Marland Kitchen aspirin EC 81 MG tablet Take 81 mg by mouth daily.  . budesonide (PULMICORT) 0.5 MG/2ML nebulizer solution Take 2 mLs (0.5 mg total) by nebulization 2 (two) times daily.  . carvedilol (COREG) 3.125 MG tablet TAKE 1 TABLET BY MOUTH TWICE DAILY WITH A MEAL. (Patient taking differently: As directed)  . cholecalciferol (VITAMIN D) 1000 units tablet Take 1,000 Units by mouth daily.   . fluticasone (CUTIVATE) 0.05 % cream Apply 1 application topically 2 (two) times daily.   . formoterol (PERFOROMIST) 20 MCG/2ML nebulizer solution Take 2 mLs (20 mcg total) by nebulization 2 (two) times daily.  . formoterol (PERFOROMIST) 20 MCG/2ML nebulizer solution Take 2 mLs (20 mcg total) by nebulization 2 (two) times daily.  . furosemide (LASIX) 20 MG tablet TAKE 1 TABLET ONCE DAILY.  Marland Kitchen guaiFENesin (MUCINEX) 600 MG 12 hr tablet Take 600 mg by mouth in the morning, at noon, in the evening, and at bedtime.   Marland Kitchen levothyroxine (SYNTHROID) 125 MCG tablet TAKE 1 TABLET ONCE DAILY.  . magnesium oxide (MAG-OX) 400 (241.3 Mg) MG tablet Take 0.5 tablets (200 mg total) by mouth daily.  . midodrine (PROAMATINE) 2.5 MG tablet TAKE 1 TABLET 3 TIMES DAILY WITH MEALS.  . Multiple Vitamins-Minerals (PRESERVISION AREDS 2 PO) Take 1 capsule by mouth daily.  Marland Kitchen oxybutynin (DITROPAN) 5 MG tablet TAKE 1 TABLET BY MOUTH TWICE DAILY.  Marland Kitchen polyethylene glycol (MIRALAX / GLYCOLAX) 17 g packet Take 17 g  by mouth daily.  . pravastatin (PRAVACHOL) 40 MG tablet TAKE 1 TABLET ONCE DAILY.  . revefenacin (YUPELRI) 175 MCG/3ML nebulizer solution Take 3 mLs (175 mcg total) by nebulization daily.  . Revefenacin (YUPELRI) 175 MCG/3ML SOLN Inhale 3 mLs into the lungs daily.  . sertraline (ZOLOFT) 50 MG tablet TAKE ONE TABLET AT BEDTIME.  Marland Kitchen thiamine 100 MG tablet Take 1 tablet (100 mg total) by mouth daily.  . [DISCONTINUED] doxycycline (VIBRA-TABS) 100 MG tablet Take 1 tablet (100 mg total) by mouth 2 (two) times daily.     Review of Systems  Constitutional: Negative for activity change, appetite change, chills, fatigue, fever and unexpected weight change.  HENT: Negative for congestion, ear pain, hearing loss, sinus pressure, sinus pain, sore throat and trouble swallowing.   Eyes: Negative for pain and visual disturbance.  Respiratory: Positive for cough. Negative for chest tightness, shortness of breath and wheezing.   Cardiovascular: Negative for chest pain, palpitations and leg swelling.  Gastrointestinal: Negative for abdominal distention, abdominal pain, blood in stool, constipation, diarrhea, nausea and vomiting.  Genitourinary: Negative for decreased urine volume, difficulty urinating, dysuria, penile pain and testicular pain.  Musculoskeletal: Negative for arthralgias, back pain and joint swelling.  Skin: Negative for rash.  Neurological: Negative for dizziness, weakness, numbness and headaches.  Hematological: Negative for adenopathy. Does not bruise/bleed easily.  Psychiatric/Behavioral: Negative for agitation, sleep disturbance and suicidal ideas. The patient is not nervous/anxious.     Objective:  BP 138/80 (BP Location: Left Arm, Patient Position: Sitting, Cuff Size: Normal)   Pulse 93   Temp 97.6 F (36.4 C) (Oral)   Ht 5\' 11"  (1.803 m)   Wt 179 lb 14.4 oz (81.6 kg)   BMI 25.09 kg/m   Weight: 179 lb 14.4 oz (81.6 kg)   BP Readings from Last 3 Encounters:  11/16/20 138/80  09/20/20 122/74  09/08/20 128/80   Wt Readings from Last 3 Encounters:  11/16/20 179 lb 14.4 oz (81.6 kg)  09/20/20 182 lb (82.6 kg)  09/08/20 179 lb 4 oz (81.3 kg)    Physical Exam Constitutional:      General: He is not in acute distress.    Appearance: He is well-developed.  Eyes:     Extraocular Movements: Extraocular movements intact.     Comments: Erythema bilat lower lid; loss lash left lower lid. Purulent drainage bilat lower lids  Cardiovascular:     Rate and Rhythm: Normal rate and regular  rhythm.     Heart sounds: Murmur heard.   Systolic murmur is present with a grade of 2/6. No friction rub.  Pulmonary:     Effort: Pulmonary effort is normal. No respiratory distress.     Breath sounds: Decreased breath sounds (slight at bases) present. No wheezing or rales.  Musculoskeletal:     Right lower leg: No edema.     Left lower leg: No edema.  Skin:    Comments: Skin is dry, scaly. Multiple seborrheic keratoses face.  Neurological:     Mental Status: He is alert and oriented to person, place, and time.  Psychiatric:        Behavior: Behavior normal.     Assessment/Plan  1. Essential hypertension Blood pressure has been stable.  Continue Coreg 3.125 twice daily, amiodarone 200 mg daily.  He does follow regularly with cardiology. - CBC with Differential/Platelet; Future - Comprehensive metabolic panel; Future  2. Bilateral carotid artery disease, unspecified type ([place]) Follows regularly with cardiology.  Blood pressures been stable.  Continue pravastatin 40 mg daily.  3. Cardiomyopathy, ischemic Lasix 20 mg daily.  Has been stable.  Following regularly with cardiology.  4. COPD GOLD III Well-controlled with Pulmicort, formoterol, Yupelri.  Following with pulmonology regularly.  5. Hypothyroidism, unspecified type We will recheck blood work today.  Synthroid adjustment pending results. - TSH; Future  6. Stage 3 chronic kidney disease, unspecified whether stage 3a or 3b CKD ([place]) We will recheck blood work today.  Has been stable.  7. Vitamin D deficiency - VITAMIN D 25 Hydroxy (Vit-D Deficiency, Fractures); Future  8. Dyslipidemia Continue pravastatin 40 mg daily. - Lipid panel; Future  9. Chronic gout of foot, unspecified cause, unspecified laterality - Uric acid; Future  10. blepharoconjunctivitis Advised patient to gently wash eyelids in the shower.  Encouraged him to follow-up with his eye doctor.  In the meanwhile, apply antibiotic ointment to lower  lids as directed.   Return in about 6 months (around 05/16/2021) for Chronic condition visit. 45 minutes spent in chart review, time with patient, charting, exam.    Micheline Rough, MD

## 2020-11-20 ENCOUNTER — Other Ambulatory Visit: Payer: Self-pay | Admitting: Rheumatology

## 2020-11-20 NOTE — Telephone Encounter (Signed)
Last Visit: 09/20/2020 Next Visit: 03/22/2021 Labs: 11/16/2020, Platelets 130.0, Eosinophils Relative 29.5, Eosinophils Absolute 2.4, GFR 54.85, Triglycerides 315.0, HDL 36.10, VLDL 64.0  Current Dose per office note 09/20/2020, Fosamax 70 mg 1 tablet by mouth once a week DX: Age-related osteoporosis with current pathological fracture with routine healing, subsequent encounter  Okay to refill Fosamax?

## 2020-11-28 ENCOUNTER — Other Ambulatory Visit: Payer: Self-pay

## 2020-11-28 ENCOUNTER — Emergency Department (HOSPITAL_COMMUNITY)
Admission: EM | Admit: 2020-11-28 | Discharge: 2020-11-28 | Disposition: A | Discharge status: Home / Self Care | Payer: Medicare Other | Attending: Emergency Medicine | Admitting: Emergency Medicine

## 2020-11-28 ENCOUNTER — Encounter (HOSPITAL_COMMUNITY): Payer: Self-pay | Admitting: Emergency Medicine

## 2020-11-28 ENCOUNTER — Telehealth: Payer: Self-pay | Admitting: Family Medicine

## 2020-11-28 ENCOUNTER — Ambulatory Visit (INDEPENDENT_AMBULATORY_CARE_PROVIDER_SITE_OTHER): Payer: Medicare Other

## 2020-11-28 DIAGNOSIS — Z95810 Presence of automatic (implantable) cardiac defibrillator: Secondary | ICD-10-CM | POA: Diagnosis not present

## 2020-11-28 DIAGNOSIS — I97610 Postprocedural hemorrhage and hematoma of a circulatory system organ or structure following a cardiac catheterization: Secondary | ICD-10-CM | POA: Diagnosis not present

## 2020-11-28 DIAGNOSIS — I5042 Chronic combined systolic (congestive) and diastolic (congestive) heart failure: Secondary | ICD-10-CM

## 2020-11-28 DIAGNOSIS — Z5321 Procedure and treatment not carried out due to patient leaving prior to being seen by health care provider: Secondary | ICD-10-CM | POA: Insufficient documentation

## 2020-11-28 LAB — CBC WITH DIFFERENTIAL/PLATELET
Abs Immature Granulocytes: 0.05 10*3/uL (ref 0.00–0.07)
Basophils Absolute: 0.1 10*3/uL (ref 0.0–0.1)
Basophils Relative: 1 %
Eosinophils Absolute: 2 10*3/uL — ABNORMAL HIGH (ref 0.0–0.5)
Eosinophils Relative: 26 %
HCT: 40.7 % (ref 39.0–52.0)
Hemoglobin: 14 g/dL (ref 13.0–17.0)
Immature Granulocytes: 1 %
Lymphocytes Relative: 16 %
Lymphs Abs: 1.3 10*3/uL (ref 0.7–4.0)
MCH: 34.3 pg — ABNORMAL HIGH (ref 26.0–34.0)
MCHC: 34.4 g/dL (ref 30.0–36.0)
MCV: 99.8 fL (ref 80.0–100.0)
Monocytes Absolute: 0.6 10*3/uL (ref 0.1–1.0)
Monocytes Relative: 7 %
Neutro Abs: 3.9 10*3/uL (ref 1.7–7.7)
Neutrophils Relative %: 49 %
Platelets: 152 10*3/uL (ref 150–400)
RBC: 4.08 MIL/uL — ABNORMAL LOW (ref 4.22–5.81)
RDW: 13.8 % (ref 11.5–15.5)
WBC: 7.8 10*3/uL (ref 4.0–10.5)
nRBC: 0 % (ref 0.0–0.2)

## 2020-11-28 LAB — COMPREHENSIVE METABOLIC PANEL
ALT: 19 U/L (ref 0–44)
AST: 33 U/L (ref 15–41)
Albumin: 3.2 g/dL — ABNORMAL LOW (ref 3.5–5.0)
Alkaline Phosphatase: 80 U/L (ref 38–126)
Anion gap: 10 (ref 5–15)
BUN: 18 mg/dL (ref 8–23)
CO2: 26 mmol/L (ref 22–32)
Calcium: 9.1 mg/dL (ref 8.9–10.3)
Chloride: 101 mmol/L (ref 98–111)
Creatinine, Ser: 1.26 mg/dL — ABNORMAL HIGH (ref 0.61–1.24)
GFR, Estimated: 55 mL/min — ABNORMAL LOW (ref >60)
Glucose, Bld: 93 mg/dL (ref 70–99)
Potassium: 4.3 mmol/L (ref 3.5–5.1)
Sodium: 137 mmol/L (ref 135–145)
Total Bilirubin: 0.6 mg/dL (ref 0.3–1.2)
Total Protein: 7.4 g/dL (ref 6.5–8.1)

## 2020-11-28 MED ORDER — MIDODRINE HCL 2.5 MG PO TABS: ORAL_TABLET | ORAL | 3 refills | Status: DC

## 2020-11-28 NOTE — ED Notes (Signed)
Pt left while in lobby.

## 2020-11-28 NOTE — Telephone Encounter (Signed)
Pt daughter is calling in stating that the pt has or had a cyst by his scrotum that has burst and is getting larger.  They were transferred to the triage nurse for further assistance.

## 2020-11-28 NOTE — ED Triage Notes (Signed)
Pt reports bleeding at old cardiac cath site in groin. States cath was done years ago, endorses bleeding off and on x2 weeks. Does take blood thinners.

## 2020-11-29 ENCOUNTER — Encounter: Payer: Self-pay | Admitting: Family Medicine

## 2020-11-29 ENCOUNTER — Other Ambulatory Visit: Payer: Self-pay

## 2020-11-29 ENCOUNTER — Ambulatory Visit (INDEPENDENT_AMBULATORY_CARE_PROVIDER_SITE_OTHER): Payer: Medicare Other | Admitting: Family Medicine

## 2020-11-29 VITALS — BP 126/82 | HR 65 | Temp 98.1°F | Resp 14 | Wt 182.2 lb

## 2020-11-29 DIAGNOSIS — I255 Ischemic cardiomyopathy: Secondary | ICD-10-CM

## 2020-11-29 DIAGNOSIS — L0501 Pilonidal cyst with abscess: Secondary | ICD-10-CM

## 2020-11-29 MED ORDER — DOXYCYCLINE HYCLATE 100 MG PO CAPS: 100.0000 mg | ORAL_CAPSULE | Freq: Two times a day (BID) | ORAL | 0 refills | Status: AC

## 2020-11-29 NOTE — Telephone Encounter (Signed)
Spoke with the pt and offered an appt with another provider.  Patient declined as he stated he was seen today by Dr Sarajane Jews.

## 2020-11-29 NOTE — Progress Notes (Signed)
   Subjective:    Patient ID: [name], male    DOB: 07-01-1931, 87 y.o.   MRN: [id number]  HPI Here for 2 weeks of a swollen and tender lump in the left groin that has opened up to drain foul smelling fluid. No fever. He feels fine in general. He went to the ED yesterday and had labs drawn. He then left without seeing a provider. The CBC was normal, and a BMET showed his renal function to be at baseline.    Review of Systems  Constitutional: Negative.   Respiratory: Negative.   Cardiovascular: Negative.   Skin: Positive for wound.       Objective:   Physical Exam Constitutional:      General: He is not in acute distress.    Appearance: Normal appearance.  Cardiovascular:     Rate and Rhythm: Normal rate and regular rhythm.     Pulses: Normal pulses.     Heart sounds: Normal heart sounds.  Pulmonary:     Effort: Pulmonary effort is normal.     Breath sounds: Normal breath sounds.  Skin:    Comments: There is a tender boil in the left groin that is draining purulent material. A large amount of this was expressed with gentle pressure   Neurological:     Mental Status: He is alert.           Assessment & Plan:  Abscessed cyst. Treat with warm compresses and 10 days of Doxycycline. Recheck prn.  Alysia Penna, MD

## 2020-11-29 NOTE — Telephone Encounter (Signed)
Noted  

## 2020-12-02 NOTE — Progress Notes (Signed)
EPIC Encounter for ICM Monitoring  Patient Name: [name] is a 87 y.o. male Date: 12/02/2020 Primary Care Physican: Caren Macadam, MD Primary Cardiologist: Hochrein Electrophysiologist: Faustino Congress Weight:unknown  Spoke with call to daughter, [name] and she reports patient has not complained about any fluid symptoms.    Optivol thoracic impedancesuggesting possible fluid accumulation since 11/24/2020 but starting to trend back toward baseline.  Prescribed:  Furosemide20 mg 1 tabletdaily.  Labs: 09/20/2020 Creatinine 1.23, BUN 21, Potassium 4.4, Sodium 140, GFR 52-61 07/01/2020 Creatinine1.39, BUN21, Potassium4.9, Sodium140, GFR45-52 06/15/2020 Creatinine1.33, BUN20, Potassium5.5, Sodium140, [id number] A complete set of results can be found in Results Review.  Recommendations: Advised to limit salt and fluid intake. Will recheck fluid levels 2/16  Follow-up plan: ICM clinic phone appointment on2/16/2022 to recheck fluid levels. 91 day device clinic remote transmission1/09/2021.   EP/Cardiology Office Visits:4/5/2022with Dr.Klein.  Copy of ICM check sent to [place].   3 month ICM trend: 12/02/2020.    1 Year ICM trend:       [name], RN 12/02/2020 12:38 PM

## 2020-12-05 ENCOUNTER — Telehealth: Payer: Self-pay | Admitting: Pulmonary Disease

## 2020-12-05 MED ORDER — FORMOTEROL FUMARATE 20 MCG/2ML IN NEBU: 20.0000 ug | INHALATION_SOLUTION | Freq: Two times a day (BID) | RESPIRATORY_TRACT | 0 refills | Status: DC

## 2020-12-05 NOTE — Telephone Encounter (Signed)
Called spoke with [name] daughter.  She states the caregiver will stop by and pick up a box of the perforomist. Sample placed up front for pick up

## 2020-12-07 ENCOUNTER — Telehealth: Payer: Self-pay

## 2020-12-07 ENCOUNTER — Ambulatory Visit (INDEPENDENT_AMBULATORY_CARE_PROVIDER_SITE_OTHER): Payer: Medicare Other

## 2020-12-07 DIAGNOSIS — I5042 Chronic combined systolic (congestive) and diastolic (congestive) heart failure: Secondary | ICD-10-CM

## 2020-12-07 DIAGNOSIS — Z95810 Presence of automatic (implantable) cardiac defibrillator: Secondary | ICD-10-CM

## 2020-12-07 NOTE — Progress Notes (Signed)
EPIC Encounter for ICM Monitoring  Patient Name: [name] is a 87 y.o. male Date: 12/07/2020 Primary Care Physican: Caren Macadam, MD Primary Cardiologist: Hochrein Electrophysiologist: Faustino Congress Weight:unknown  Spoke with call to daughter, [name] and transmission reviewed.  She reports patient is doing well.  Optivol thoracic impedancesuggesting fluid levels returned to normal.  Prescribed:  Furosemide20 mg 1 tabletdaily.  Labs: 09/20/2020 Creatinine 1.23, BUN 21, Potassium 4.4, Sodium 140, GFR 52-61 07/01/2020 Creatinine1.39, BUN21, Potassium4.9, Sodium140, GFR45-52 06/15/2020 Creatinine1.33, BUN20, Potassium5.5, Sodium140, IYM41-58 A complete set of results can be found in Results Review.  Recommendations: No changes and encouraged to call if experiencing any fluid symptoms.  Follow-up plan: ICM clinic phone appointment on3/21/2022. 91 day device clinic remote transmission 02/01/2021.   EP/Cardiology Office Visits:4/5/2022with Dr.Klein.  Copy of ICM check sent to [place].   3 month ICM trend: 12/07/2020.    1 Year ICM trend:       [name], RN 12/07/2020 1:25 PM

## 2020-12-07 NOTE — Telephone Encounter (Signed)
Transmission received. See ICM Note.  

## 2020-12-07 NOTE — Telephone Encounter (Signed)
Returned call to daughter, [name] as requested by voice mail message.  Patient attempted to send remote transmission today to recheck fluid levels but monitor showing error message.  Provided [employer] tech support number and advised to call for assistance and she will have patent's caregiver call.

## 2020-12-07 NOTE — Progress Notes (Signed)
No ICM remote transmission received for 12/07/2020 and next ICM transmission scheduled for 12/19/2020.

## 2020-12-11 ENCOUNTER — Other Ambulatory Visit: Payer: Self-pay | Admitting: Family Medicine

## 2020-12-21 ENCOUNTER — Telehealth: Payer: Self-pay | Admitting: Pulmonary Disease

## 2020-12-21 MED ORDER — DOXYCYCLINE HYCLATE 100 MG PO TABS: 100.0000 mg | ORAL_TABLET | Freq: Two times a day (BID) | ORAL | 0 refills | Status: DC

## 2020-12-21 MED ORDER — PREDNISONE 20 MG PO TABS: ORAL_TABLET | ORAL | 0 refills | Status: DC

## 2020-12-21 NOTE — Telephone Encounter (Signed)
Spoke with [name], she verbalized understanding of recommendations. Will go ahead and send in the prednisone and doxy to [facility].   Nothing further needed at time of call.

## 2020-12-21 NOTE — Telephone Encounter (Signed)
Called and spoke with [name], pts daughter.  She stated that she has been here with him for about a week.  He has been doing well until today.  He has started coughing today and she stated that he coughs so hard at times that he cannot catch his breath.  She stated that she can hear him rattling in his chest. He is not able to cough anything up at this time. He is currently taking 4 mucinex per day.  He just finished 2 weeks of abx for a boil that he had lanced in his groin area.  He stated that he is having the chest wall pain from the cough.  BI please adivise. Thanks  He has an upcoming appt with TP on 3/7  Allergies  Allergen Reactions  . Lisinopril     BP dropped too low per daughter     Current Outpatient Medications on File Prior to Visit  Medication Sig Dispense Refill  . alendronate (FOSAMAX) 70 MG tablet TAKE 1 TAB ONCE A WEEK, AT LEAST 30 MIN BEFORE 1ST FOOD.DO NOT LIE DOWN FOR 30 MIN AFTER TAKING. 12 tablet 0  . allopurinol (ZYLOPRIM) 100 MG tablet TAKE 1 TABLET ONCE DAILY. 30 tablet 10  . amiodarone (PACERONE) 200 MG tablet TAKE 1 TABLET (200MG ) BY MOUTH 5 DAYS PER WEEK. 90 tablet 3  . aspirin EC 81 MG tablet Take 81 mg by mouth daily.    . budesonide (PULMICORT) 0.5 MG/2ML nebulizer solution Take 2 mLs (0.5 mg total) by nebulization 2 (two) times daily. 120 mL 0  . carvedilol (COREG) 3.125 MG tablet TAKE 1 TABLET BY MOUTH TWICE DAILY WITH A MEAL. (Patient taking differently: As directed) 180 tablet 3  . cholecalciferol (VITAMIN D) 1000 units tablet Take 1,000 Units by mouth daily.     . fluticasone (CUTIVATE) 0.05 % cream Apply 1 application topically 2 (two) times daily.   2  . formoterol (PERFOROMIST) 20 MCG/2ML nebulizer solution Take 2 mLs (20 mcg total) by nebulization 2 (two) times daily. 120 mL 5  . formoterol (PERFOROMIST) 20 MCG/2ML nebulizer solution Take 2 mLs (20 mcg total) by nebulization 2 (two) times daily. 4 mL 0  . formoterol (PERFOROMIST) 20 MCG/2ML nebulizer  solution Take 2 mLs (20 mcg total) by nebulization 2 (two) times daily. 20 mL 0  . furosemide (LASIX) 20 MG tablet TAKE 1 TABLET ONCE DAILY. 90 tablet 3  . guaiFENesin (MUCINEX) 600 MG 12 hr tablet Take 600 mg by mouth in the morning, at noon, in the evening, and at bedtime.     Marland Kitchen levothyroxine (SYNTHROID) 125 MCG tablet TAKE 1 TABLET ONCE DAILY. 90 tablet 1  . magnesium oxide (MAG-OX) 400 (241.3 Mg) MG tablet Take 0.5 tablets (200 mg total) by mouth daily. 30 tablet 0  . midodrine (PROAMATINE) 2.5 MG tablet TAKE 1 TABLET 3 TIMES DAILY WITH MEALS. 270 tablet 3  . Multiple Vitamins-Minerals (PRESERVISION AREDS 2 PO) Take 1 capsule by mouth daily.    Marland Kitchen oxybutynin (DITROPAN) 5 MG tablet TAKE 1 TABLET BY MOUTH TWICE DAILY. 60 tablet 4  . polyethylene glycol (MIRALAX / GLYCOLAX) 17 g packet Take 17 g by mouth daily.    . pravastatin (PRAVACHOL) 40 MG tablet TAKE 1 TABLET ONCE DAILY. 90 tablet 1  . revefenacin (YUPELRI) 175 MCG/3ML nebulizer solution Take 3 mLs (175 mcg total) by nebulization daily. 3 mL 0  . Revefenacin (YUPELRI) 175 MCG/3ML SOLN Inhale 3 mLs into the lungs daily. 120 mL 5  .  sertraline (ZOLOFT) 50 MG tablet TAKE ONE TABLET AT BEDTIME. 30 tablet 5  . thiamine 100 MG tablet Take 1 tablet (100 mg total) by mouth daily. 30 tablet 0   No current facility-administered medications on file prior to visit.

## 2020-12-21 NOTE — Telephone Encounter (Signed)
Start on doxycycline 100mg  BID X 7 days  Prednisone 40mg  daily X 5 days Make sure to follow up with TP appt   Garner Nash, DO [facility] 12/21/2020 3:07 PM

## 2020-12-22 ENCOUNTER — Telehealth: Payer: Self-pay | Admitting: Pulmonary Disease

## 2020-12-22 NOTE — Telephone Encounter (Signed)
disregard

## 2020-12-26 ENCOUNTER — Ambulatory Visit: Payer: Medicare Other | Admitting: Adult Health

## 2020-12-28 ENCOUNTER — Ambulatory Visit (INDEPENDENT_AMBULATORY_CARE_PROVIDER_SITE_OTHER): Payer: Medicare Other

## 2020-12-28 ENCOUNTER — Other Ambulatory Visit: Payer: Self-pay

## 2020-12-28 ENCOUNTER — Ambulatory Visit (INDEPENDENT_AMBULATORY_CARE_PROVIDER_SITE_OTHER): Payer: Medicare Other | Admitting: Acute Care

## 2020-12-28 ENCOUNTER — Encounter: Payer: Self-pay | Admitting: Acute Care

## 2020-12-28 VITALS — BP 116/74 | HR 71 | Temp 97.7°F | Ht 71.0 in | Wt 178.8 lb

## 2020-12-28 DIAGNOSIS — I255 Ischemic cardiomyopathy: Secondary | ICD-10-CM

## 2020-12-28 DIAGNOSIS — J449 Chronic obstructive pulmonary disease, unspecified: Secondary | ICD-10-CM | POA: Diagnosis not present

## 2020-12-28 DIAGNOSIS — R059 Cough, unspecified: Secondary | ICD-10-CM | POA: Diagnosis not present

## 2020-12-28 MED ORDER — PREDNISONE 10 MG (21) PO TBPK: ORAL_TABLET | Freq: Every day | ORAL | 0 refills | Status: DC

## 2020-12-28 NOTE — Progress Notes (Signed)
Please let patient  know his CXR was without any acute . This is good news. Complete your prednisone taper and follow up as we discussed.Call if you need [place] sooner

## 2020-12-28 NOTE — Progress Notes (Signed)
History of Present Illness [name] is a 87 y.o. male with PMH of CAD, , GOPD III,chronic hypoxic respiratory failure on nocturnal oxygen at 2 L . He is followed by Dr. Valeta Harms [facility] Perforomist and budesonide nebulizers twice daily and Yupelri once daily.  He is on oxygen at bedtime at 2 L.   Of note patient has been on prednisone in the past. This has been weaned and discontinued for about two years.   12/28/2020 Follow up Cough Pt. Presents for follow up. He called the office 12/21/2020 with a cough so bad he could not catch his breath.Dr. Valeta Harms called in  Doxycycline and prednisone 40 mg daily x 5 days. The prednisone made an immediate difference. Pt. Has 2 additional days of Doxycycline. His daughter noted today that he has a bit of a cough returning. Last secretions were white. He denies any fever. 1 week ago he did  feel feverish, but since treatment with Doxycycline he has been better. Marland Kitchen He denies any post nasal gtt. He is here today with his daughter.   Test Results: CXR 12/28/2020 Negative for heart failure. Lungs are clear without infiltrate or effusion. Apical scarring bilaterally. Chronic rib fractures bilaterally. Chronic fracture with vertebroplasty at T12 is unchanged.  CBC Latest Ref Rng & Units 11/28/2020 11/16/2020 09/20/2020  WBC 4.0 - 10.5 K/uL 7.8 8.0 9.0  Hemoglobin 13.0 - 17.0 g/dL 14.0 15.3 14.3  Hematocrit 39.0 - 52.0 % 40.7 44.1 42.2  Platelets 150 - 400 K/uL 152 130.0(L) 127(L)    BMP Latest Ref Rng & Units 11/28/2020 11/16/2020 09/20/2020  Glucose 70 - 99 mg/dL 93 80 87  BUN 8 - 23 mg/dL 18 21 21   Creatinine 0.61 - 1.24 mg/dL 1.26(H) 1.19 1.23(H)  BUN/Creat Ratio 6 - 22 (calc) - - 17  Sodium 135 - 145 mmol/L 137 137 140  Potassium 3.5 - 5.1 mmol/L 4.3 4.2 4.4  Chloride 98 - 111 mmol/L 101 100 103  CO2 22 - 32 mmol/L 26 30 29   Calcium 8.9 - 10.3 mg/dL 9.1 9.5 9.3    BNP    Component Value Date/Time   BNP 1,348.6 (H) 09/30/2017 0956     ProBNP    Component Value Date/Time   PROBNP 940.0 (H) 10/08/2018 1132    PFT No results found for: FEV1PRE, FEV1POST, FVCPRE, FVCPOST, TLC, DLCOUNC, PREFEV1FVCRT, PSTFEV1FVCRT  No results found.   Past medical hx Past Medical History:  Diagnosis Date  . AAA (abdominal aortic aneurysm) ([place])    a. 2002 s/p repair.  . Allergic rhinitis   . Back pain   . CAD (coronary artery disease)    a. 02/2002 H/o MI with stenting x 2; b. 04/2013 MV: EF 25%, large posterior lateral infarct w/o ischemia; c. 03/2016 VT Arrest/Cath: LM 60ost, LAD 40p/m ISR, LCX 100p/m, RCA nl-->Med Rx.  . Carotid arterial disease ([place])    a. 12/2010 s/p R CEA;  b. 05/2015 Carotid U/S: bilat <40% ICA stenosis.  . Chronic combined systolic and diastolic CHF (congestive heart failure) ([place])    a. 03/2016 Echo: Ef 35-40%, grade 1 DD.  Marland Kitchen Compression fracture   . COPD (chronic obstructive pulmonary disease) ([place])   . Dermatitis   . Diverticulosis of colon   . DJD (degenerative joint disease)    and Gout  . Hemorrhoids   . History of colonic polyps   . History of gout   . History of pneumonia   . Hypercholesterolemia   . Hypertension   .  Hypertensive heart disease   . Ischemic cardiomyopathy    a. EF prev <35%-->improved to normal by Echo 8/14:  Mild LVH, focal basal hypertrophy, EF 60-65%, normal wall motion, mild BAE, PASP 36; c. 03/2016 Echo: EF 35-40%, Gr1 DD, triv AI, mild MR, mod dil LA, mild-mod TR, PASP 100mmHg.  . Osteoporosis   . Peripheral vascular disease ([place])   . Presence of cardiac defibrillator    a. 03/2008 s/p MDT [id number] Maximo II DR, DC AICD; b. 03/2013: ICD shock for T wave oversensing;  c. 10/2015: collective decision not to replace ICD given improvement in LV fxn; d. VT Arrest-->Gen change to MDT ser # [id number] H.  . Skin cancer    shoulders and forehead  . Syncope   . T wave over sensing resulting in inappropriate shocks    a. 03/2013.  . Tobacco abuse   . Ventricular tachycardia ([place])  04/01/2016     Social History   Tobacco Use  . Smoking status: Former Smoker    Packs/day: 1.50    Years: 65.00    Pack years: 97.50    Types: Cigarettes, Cigars, Pipe    Quit date: 03/13/2008    Years since quitting: 12.8  . Smokeless tobacco: Never Used  . Tobacco comment: quit about 10 years ago  Vaping Use  . Vaping Use: Never used  Substance Use Topics  . Alcohol use: Yes    Alcohol/week: 0.0 standard drinks    Comment: occ  . Drug use: Never    [name] reports that he quit smoking about 12 years ago. His smoking use included cigarettes, cigars, and pipe. He has a 97.50 pack-year smoking history. He has never used smokeless tobacco. He reports current alcohol use. He reports that he does not use drugs.  Tobacco Cessation: Former smoker with a 97.5  pack year smoking history. He quit 2009 Past surgical hx, Family hx, Social hx all reviewed.  Current Outpatient Medications on File Prior to Visit  Medication Sig  . alendronate (FOSAMAX) 70 MG tablet TAKE 1 TAB ONCE A WEEK, AT LEAST 30 MIN BEFORE 1ST FOOD.DO NOT LIE DOWN FOR 30 MIN AFTER TAKING.  Marland Kitchen allopurinol (ZYLOPRIM) 100 MG tablet TAKE 1 TABLET ONCE DAILY.  Marland Kitchen amiodarone (PACERONE) 200 MG tablet TAKE 1 TABLET (200MG ) BY MOUTH 5 DAYS PER WEEK.  Marland Kitchen aspirin EC 81 MG tablet Take 81 mg by mouth daily.  . budesonide (PULMICORT) 0.5 MG/2ML nebulizer solution Take 2 mLs (0.5 mg total) by nebulization 2 (two) times daily.  . carvedilol (COREG) 3.125 MG tablet TAKE 1 TABLET BY MOUTH TWICE DAILY WITH A MEAL. (Patient taking differently: As directed)  . cholecalciferol (VITAMIN D) 1000 units tablet Take 1,000 Units by mouth daily.   Marland Kitchen doxycycline (VIBRA-TABS) 100 MG tablet Take 1 tablet (100 mg total) by mouth 2 (two) times daily.  . fluticasone (CUTIVATE) 0.05 % cream Apply 1 application topically 2 (two) times daily.   . formoterol (PERFOROMIST) 20 MCG/2ML nebulizer solution Take 2 mLs (20 mcg total) by nebulization 2 (two) times  daily.  . formoterol (PERFOROMIST) 20 MCG/2ML nebulizer solution Take 2 mLs (20 mcg total) by nebulization 2 (two) times daily.  . formoterol (PERFOROMIST) 20 MCG/2ML nebulizer solution Take 2 mLs (20 mcg total) by nebulization 2 (two) times daily.  . furosemide (LASIX) 20 MG tablet TAKE 1 TABLET ONCE DAILY.  Marland Kitchen guaiFENesin (MUCINEX) 600 MG 12 hr tablet Take 600 mg by mouth in the morning, at noon, in the evening, and at  bedtime.   Marland Kitchen levothyroxine (SYNTHROID) 125 MCG tablet TAKE 1 TABLET ONCE DAILY.  . magnesium oxide (MAG-OX) 400 (241.3 Mg) MG tablet Take 0.5 tablets (200 mg total) by mouth daily.  . midodrine (PROAMATINE) 2.5 MG tablet TAKE 1 TABLET 3 TIMES DAILY WITH MEALS.  . Multiple Vitamins-Minerals (PRESERVISION AREDS 2 PO) Take 1 capsule by mouth daily.  Marland Kitchen oxybutynin (DITROPAN) 5 MG tablet TAKE 1 TABLET BY MOUTH TWICE DAILY.  Marland Kitchen polyethylene glycol (MIRALAX / GLYCOLAX) 17 g packet Take 17 g by mouth daily.  . pravastatin (PRAVACHOL) 40 MG tablet TAKE 1 TABLET ONCE DAILY.  Marland Kitchen predniSONE (DELTASONE) 20 MG tablet Take 2 tablets daily for 5 days.  . revefenacin (YUPELRI) 175 MCG/3ML nebulizer solution Take 3 mLs (175 mcg total) by nebulization daily.  . Revefenacin (YUPELRI) 175 MCG/3ML SOLN Inhale 3 mLs into the lungs daily.  . sertraline (ZOLOFT) 50 MG tablet TAKE ONE TABLET AT BEDTIME.  Marland Kitchen thiamine 100 MG tablet Take 1 tablet (100 mg total) by mouth daily.   No current facility-administered medications on file prior to visit.     Allergies  Allergen Reactions  . Lisinopril     BP dropped too low per daughter    Review Of Systems:  Constitutional:   No  weight loss, night sweats,  Fevers, chills, fatigue, or  lassitude.  HEENT:   No headaches,  Difficulty swallowing,  Tooth/dental problems, or  Sore throat,                No sneezing, itching, ear ache, nasal congestion, post nasal drip,   CV:  No chest pain,  Orthopnea, PND, swelling in lower extremities, anasarca, dizziness,  palpitations, syncope.   GI  No heartburn, indigestion, abdominal pain, nausea, vomiting, diarrhea, change in bowel habits, loss of appetite, bloody stools.   Resp: No shortness of breath with exertion or at rest.  No excess mucus, + productive cough,  + non-productive cough,  No coughing up of blood.  No change in color of mucus.  No wheezing.  No chest wall deformity  Skin: no rash or lesions.  GU: no dysuria, change in color of urine, no urgency or frequency.  No flank pain, no hematuria   MS:  No joint pain or swelling.  No decreased range of motion.  No back pain.  Psych:  No change in mood or affect. No depression or anxiety.  No memory loss.   Vital Signs BP 116/74 (BP Location: Left Arm, Cuff Size: Normal)   Pulse 71   Temp 97.7 F (36.5 C) (Oral)   Ht 5\' 11"  (1.803 m)   Wt 178 lb 12.8 oz (81.1 kg)   SpO2 98%   BMI 24.94 kg/m    Physical Exam:  General- No distress,  A&Ox3, pleasant  ENT: No sinus tenderness, TM clear, pale nasal mucosa, no oral exudate,no post nasal drip, no LAN, some throat clearing Cardiac: S1, S2, regular rate and rhythm, no murmur Chest: No wheeze/ rales/ dullness; no accessory muscle use, no nasal flaring, no sternal retractions, diminished per right base Abd.: Soft Non-tender, ND, BS +, Body mass index is 24.94 kg/m. Ext: No clubbing cyanosis, edema Neuro:  normal strength, MAE x 4, A&O x 3, som deconditioning note.  Skin: No rashes, warm and dry, no lesions Psych: normal mood and behavior   Assessment/Plan Resolving Cough vs COPD Flare Treated with Doxycycline and Prednisone 40 mg daily x 5 days Improved cough after treatment CXR with no acute cardiopulmonry  disease 3/9 Plan We will prescribe a prednisone taper This will help to gradually wean your from the prednisone Prednisone taper; 10 mg tablets: 4 tabs x 2 days, 3 tabs x 2 days, 2 tabs x 2 days 1 tab x 2 days then stop. We will do a CXR today. We will call you with results Sips  of water instead of throat clearing Sugar Free Jolly Ranchers or Werther's originals for throat soothing. Continue Perforomist and budesonide nebulizers twice daily and Yupelri once daily. Follow up in 1 month or sooner if needed.  Please contact office for sooner follow up if symptoms do not improve or worsen or seek emergency care   Can consider adding Flutter valve if continued issues with cough   This appointment was 30 min long with over 50% of the time in direct face-to-face patient care, assessment, plan of care, and follow-up.  Magdalen Spatz, NP 12/28/2020  3:45 PM

## 2020-12-28 NOTE — Patient Instructions (Addendum)
It is good to see you today. We will prescribe a prednisone taper This will help to gradually wean your from the prednisone Prednisone taper; 10 mg tablets: 4 tabs x 2 days, 3 tabs x 2 days, 2 tabs x 2 days 1 tab x 2 days then stop. We will do a CXR today. We will call you with results Sips of water instead of throat clearing Sugar Free Jolly Ranchers or Werther's originals for throat soothing. Continue Perforomist and budesonide nebulizers twice daily and Yupelri once daily. Follow up in 1 month or sooner if needed.  Please contact office for sooner follow up if symptoms do not improve or worsen or seek emergency care

## 2020-12-29 ENCOUNTER — Encounter: Payer: Self-pay | Admitting: *Deleted
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
## 2020-12-30 ENCOUNTER — Encounter: Payer: Self-pay | Admitting: Cardiology

## 2020-12-30 ENCOUNTER — Other Ambulatory Visit: Payer: Self-pay

## 2020-12-30 ENCOUNTER — Ambulatory Visit (INDEPENDENT_AMBULATORY_CARE_PROVIDER_SITE_OTHER): Payer: Medicare Other | Admitting: Cardiology

## 2020-12-30 VITALS — BP 146/86 | HR 86 | Ht 71.0 in | Wt 180.4 lb

## 2020-12-30 DIAGNOSIS — E785 Hyperlipidemia, unspecified: Secondary | ICD-10-CM | POA: Diagnosis not present

## 2020-12-30 DIAGNOSIS — I255 Ischemic cardiomyopathy: Secondary | ICD-10-CM | POA: Diagnosis not present

## 2020-12-30 DIAGNOSIS — I5042 Chronic combined systolic (congestive) and diastolic (congestive) heart failure: Secondary | ICD-10-CM | POA: Diagnosis not present

## 2020-12-30 DIAGNOSIS — I2510 Atherosclerotic heart disease of native coronary artery without angina pectoris: Secondary | ICD-10-CM

## 2020-12-30 DIAGNOSIS — I10 Essential (primary) hypertension: Secondary | ICD-10-CM | POA: Diagnosis not present

## 2020-12-30 NOTE — Patient Instructions (Signed)
Medication Instructions:  The current medical regimen is effective;  continue present plan and medications as directed. Please refer to the Current Medication list given to you today.  *If you need a refill on your cardiac medications before your next appointment, please call your pharmacy*  Lab Work:   Testing/Procedures:  NONE    NONE  Follow-Up: Your next appointment:  6 month(s) In Person with James Hochrein, MD   At [facility], you and your health needs are our priority.  As part of our continuing mission to provide you with exceptional heart care, we have created designated Provider Care Teams.  These Care Teams include your primary Cardiologist (physician) and Advanced Practice Providers (APPs -  Physician Assistants and Nurse Practitioners) who all work together to provide you with the care you need, when you need it.   

## 2021-01-01 ENCOUNTER — Other Ambulatory Visit: Payer: Self-pay | Admitting: Family Medicine

## 2021-01-04 NOTE — Progress Notes (Signed)
Thanks for seeing the patient.  [facility] 01/04/2021 6:32 PM

## 2021-01-09 ENCOUNTER — Ambulatory Visit (INDEPENDENT_AMBULATORY_CARE_PROVIDER_SITE_OTHER): Payer: Medicare Other

## 2021-01-09 DIAGNOSIS — Z95810 Presence of automatic (implantable) cardiac defibrillator: Secondary | ICD-10-CM

## 2021-01-09 DIAGNOSIS — I5042 Chronic combined systolic (congestive) and diastolic (congestive) heart failure: Secondary | ICD-10-CM

## 2021-01-10 NOTE — Progress Notes (Signed)
EPIC Encounter for ICM Monitoring  Patient Name: Yandell Mcjunkins is a 87 y.o. male Date: 01/10/2021 Primary Care Physican: Caren Macadam, MD Primary Cardiologist: Ama Electrophysiologist: Faustino Congress Weight:unknown  Transmission reviewed.    Optivol thoracic impedancesuggesting normal fluid levels.  Prescribed:  Furosemide20 mg 1 tabletdaily.  Labs: 11/30/2021Creatinine 1.23, BUN 21, Potassium 4.4, Sodium 140, GFR 52-61 07/01/2020 Creatinine1.39, BUN21, Potassium4.9, Sodium140, GFR45-52 06/15/2020 Creatinine1.33, BUN20, Potassium5.5, Sodium140, CXK48-18 A complete set of results can be found in Results Review.  Recommendations:No changes and encouraged to call if experiencing any fluid symptoms.  Follow-up plan: ICM clinic phone appointment on5/11/2020 since has office defib check on 01/24/2021. 91 day device clinic remote transmission 02/01/2021.   EP/Cardiology Office Visits:4/5/2022with Dr.Klein.  Copy of ICM check sent to [place].   3 month ICM trend: 01/09/2021.    1 Year ICM trend:       [name], RN 01/10/2021 2:11 PM

## 2021-01-18 ENCOUNTER — Encounter: Payer: Self-pay | Admitting: Family Medicine

## 2021-01-19 DIAGNOSIS — Z23 Encounter for immunization: Secondary | ICD-10-CM | POA: Diagnosis not present

## 2021-01-24 ENCOUNTER — Ambulatory Visit (INDEPENDENT_AMBULATORY_CARE_PROVIDER_SITE_OTHER): Payer: Medicare Other | Admitting: Internal Medicine

## 2021-01-24 ENCOUNTER — Encounter: Payer: Self-pay | Admitting: Internal Medicine

## 2021-01-24 ENCOUNTER — Other Ambulatory Visit: Payer: Self-pay

## 2021-01-24 VITALS — BP 122/68 | HR 75 | Ht 71.0 in | Wt 178.0 lb

## 2021-01-24 DIAGNOSIS — I472 Ventricular tachycardia, unspecified: Secondary | ICD-10-CM

## 2021-01-24 DIAGNOSIS — I5042 Chronic combined systolic (congestive) and diastolic (congestive) heart failure: Secondary | ICD-10-CM | POA: Diagnosis not present

## 2021-01-24 DIAGNOSIS — I255 Ischemic cardiomyopathy: Secondary | ICD-10-CM

## 2021-01-24 DIAGNOSIS — Z95810 Presence of automatic (implantable) cardiac defibrillator: Secondary | ICD-10-CM | POA: Diagnosis not present

## 2021-01-24 MED ORDER — MIDODRINE HCL 2.5 MG PO TABS: 2.5000 mg | ORAL_TABLET | Freq: Two times a day (BID) | ORAL | 3 refills | Status: DC

## 2021-01-24 MED ORDER — AMIODARONE HCL 200 MG PO TABS: 100.0000 mg | ORAL_TABLET | Freq: Every day | ORAL | 3 refills | Status: DC

## 2021-01-24 NOTE — Patient Instructions (Addendum)
Medication Instructions:  Your physician has recommended you make the following change in your medication:   Decrease your Amiodarone to 100mg  - (1/2 tablet by mouth daily)  Proamatine 2.5mg  - 1 tablet by mouth twice daily upon awakening and after nap.  *If you need a refill on your cardiac medications before your next appointment, please call your pharmacy*   Lab Work: None ordered.  If you have labs (blood work) drawn today and your tests are completely normal, you will receive your results only by: Marland Kitchen MyChart Message (if you have MyChart) OR . A paper copy in the mail If you have any lab test that is abnormal or we need to change your treatment, we will call you to review the results.   Testing/Procedures: None ordered.    Follow-Up: At [facility], you and your health needs are our priority.  As part of our continuing mission to provide you with exceptional heart care, we have created designated Provider Care Teams.  These Care Teams include your primary Cardiologist (physician) and Advanced Practice Providers (APPs -  Physician Assistants and Nurse Practitioners) who all work together to provide you with the care you need, when you need it.  We recommend signing up for the patient portal called "MyChart".  Sign up information is provided on this After Visit Summary.  MyChart is used to connect with patients for Virtual Visits (Telemedicine).  Patients are able to view lab/test results, encounter notes, upcoming appointments, etc.  Non-urgent messages can be sent to your provider as well.   To learn more about what you can do with MyChart, go to [email or web address].    Your next appointment:   6 month(s)  The format for your next appointment:   In Person  Provider:   [name], MD
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
## 2021-01-29 ENCOUNTER — Other Ambulatory Visit: Payer: Self-pay | Admitting: Family Medicine

## 2021-01-30 ENCOUNTER — Encounter: Payer: Self-pay | Admitting: Acute Care

## 2021-01-30 ENCOUNTER — Ambulatory Visit (INDEPENDENT_AMBULATORY_CARE_PROVIDER_SITE_OTHER): Payer: Medicare Other | Admitting: Acute Care

## 2021-01-30 ENCOUNTER — Other Ambulatory Visit: Payer: Self-pay

## 2021-01-30 VITALS — BP 122/74 | HR 74 | Temp 97.6°F | Ht 71.0 in | Wt 178.4 lb

## 2021-01-30 DIAGNOSIS — Z87891 Personal history of nicotine dependence: Secondary | ICD-10-CM | POA: Diagnosis not present

## 2021-01-30 DIAGNOSIS — J441 Chronic obstructive pulmonary disease with (acute) exacerbation: Secondary | ICD-10-CM

## 2021-01-30 DIAGNOSIS — J9611 Chronic respiratory failure with hypoxia: Secondary | ICD-10-CM

## 2021-01-30 DIAGNOSIS — I255 Ischemic cardiomyopathy: Secondary | ICD-10-CM | POA: Diagnosis not present

## 2021-01-30 DIAGNOSIS — G4734 Idiopathic sleep related nonobstructive alveolar hypoventilation: Secondary | ICD-10-CM

## 2021-01-30 NOTE — Patient Instructions (Signed)
It is good to see you today. Continue Perforomist and budesonide nebulizers twice daily and Yupelri once daily. Rinse mouth after use  Use your rescue inhaler as needed for shortness of breath or wheezing Follow up with Dr. Valeta Harms in 6 months. Please contact office for sooner follow up if symptoms do not improve or worsen or seek emergency care Call us if you need [place] sooner.

## 2021-01-30 NOTE — Progress Notes (Signed)
History of Present Illness [name] is a 87 y.o. male former smoker ( Quit 2009 with a 97.5 pack year smoking history) with PMH of CAD, , COPD III,chronic hypoxic respiratory failure on nocturnal oxygen at 2 L . He is followed by Dr. Valeta Harms Maintenance Therapy  Perforomist and budesonide nebulizers twice daily and Yupelri once daily. He is on oxygen at bedtime at 2 L.    01/30/2021  Pt. Presents for follow up. He was last seen 12/28/2020 for a cough that was so bad it took his breath. He had  treated with Doxycycline and a prednisone taper by Dr. Valeta Harms. The cough improved , but recurred after he stopped the prednisone taper. I treated him with an additional pred taper. He is here for follow up. He states he completed his prednisone taper. He is feels he is back to his baseline in regard to his cough. He is compliant with his Perforomist and budesonide nebulizers twice daily and Yupelri once daily. He says the nebulizer therapy has really helped. He feels he is getting more of the Medicine through the nebulizer. He is compliant with his oxygen at bedtime. He states he feels his breathing is good at present. No fever,No chest pain, no orthopnea or hemoptysis. He states he has an occasional productive cough. He states secretions are clear. He has not had to use his rescue inhaler at all.   Test Results: CXR 12/28/2020  Heart size mildly enlarged. AICD unchanged in position. Atherosclerotic aortic arch Negative for heart failure. Lungs are clear without infiltrate or effusion. Apical scarring bilaterally. Chronic rib fractures bilaterally. Chronic fracture with vertebroplasty at T12 is unchanged.  IMPRESSION: No active cardiopulmonary disease.  HRCT 08/2020 No definitive evidence of interstitial lung disease or amiodarone toxicity. Ascending Aortic aneurysm NOS (ICD10-I71.9), minimally increased from 07/21/2018. Recommend annual imaging followup by CTA or MRA. Aortic atherosclerosis  (ICD10-I70.0). Coronary artery calcification. Enlarged pulmonic trunk, indicative of pulmonary arterial hypertension. Emphysema (ICD10-J43.9).   Spirometry 2016    CBC Latest Ref Rng & Units 11/28/2020 11/16/2020 09/20/2020  WBC 4.0 - 10.5 K/uL 7.8 8.0 9.0  Hemoglobin 13.0 - 17.0 g/dL 14.0 15.3 14.3  Hematocrit 39.0 - 52.0 % 40.7 44.1 42.2  Platelets 150 - 400 K/uL 152 130.0(L) 127(L)    BMP Latest Ref Rng & Units 11/28/2020 11/16/2020 09/20/2020  Glucose 70 - 99 mg/dL 93 80 87  BUN 8 - 23 mg/dL 18 21 21   Creatinine 0.61 - 1.24 mg/dL 1.26(H) 1.19 1.23(H)  BUN/Creat Ratio 6 - 22 (calc) - - 17  Sodium 135 - 145 mmol/L 137 137 140  Potassium 3.5 - 5.1 mmol/L 4.3 4.2 4.4  Chloride 98 - 111 mmol/L 101 100 103  CO2 22 - 32 mmol/L 26 30 29   Calcium 8.9 - 10.3 mg/dL 9.1 9.5 9.3    BNP    Component Value Date/Time   BNP 1,348.6 (H) 09/30/2017 0956    ProBNP    Component Value Date/Time   PROBNP 940.0 (H) 10/08/2018 1132    PFT No results found for: FEV1PRE, FEV1POST, FVCPRE, FVCPOST, TLC, DLCOUNC, PREFEV1FVCRT, PSTFEV1FVCRT  No results found.   Past medical hx Past Medical History:  Diagnosis Date  . AAA (abdominal aortic aneurysm) ([place])    a. 2002 s/p repair.  . Allergic rhinitis   . Back pain   . CAD (coronary artery disease)    a. 02/2002 H/o MI with stenting x 2; b. 04/2013 MV: EF 25%, large posterior lateral infarct w/o  ischemia; c. 03/2016 VT Arrest/Cath: LM 60ost, LAD 40p/m ISR, LCX 100p/m, RCA nl-->Med Rx.  . Carotid arterial disease ([place])    a. 12/2010 s/p R CEA;  b. 05/2015 Carotid U/S: bilat <40% ICA stenosis.  . Chronic combined systolic and diastolic CHF (congestive heart failure) ([place])    a. 03/2016 Echo: Ef 35-40%, grade 1 DD.  Marland Kitchen Compression fracture   . COPD (chronic obstructive pulmonary disease) ([place])   . Dermatitis   . Diverticulosis of colon   . DJD (degenerative joint disease)    and Gout  . Hemorrhoids   . History of colonic polyps   . History of  gout   . History of pneumonia   . Hypercholesterolemia   . Hypertension   . Hypertensive heart disease   . Ischemic cardiomyopathy    a. EF prev <35%-->improved to normal by Echo 8/14:  Mild LVH, focal basal hypertrophy, EF 60-65%, normal wall motion, mild BAE, PASP 36; c. 03/2016 Echo: EF 35-40%, Gr1 DD, triv AI, mild MR, mod dil LA, mild-mod TR, PASP 96mmHg.  . Osteoporosis   . Peripheral vascular disease ([place])   . Presence of cardiac defibrillator    a. 03/2008 s/p MDT [id number] Maximo II DR, DC AICD; b. 03/2013: ICD shock for T wave oversensing;  c. 10/2015: collective decision not to replace ICD given improvement in LV fxn; d. VT Arrest-->Gen change to MDT ser # [id number].  . Skin cancer    shoulders and forehead  . Syncope   . T wave over sensing resulting in inappropriate shocks    a. 03/2013.  . Tobacco abuse   . Ventricular tachycardia ([place]) 04/01/2016     Social History   Tobacco Use  . Smoking status: Former Smoker    Packs/day: 1.50    Years: 65.00    Pack years: 97.50    Types: Cigarettes, Cigars, Pipe    Quit date: 03/13/2008    Years since quitting: 12.8  . Smokeless tobacco: Never Used  . Tobacco comment: quit about 10 years ago  Vaping Use  . Vaping Use: Never used  Substance Use Topics  . Alcohol use: Yes    Alcohol/week: 0.0 standard drinks    Comment: occ  . Drug use: Never    [name] reports that he quit smoking about 12 years ago. His smoking use included cigarettes, cigars, and pipe. He has a 97.50 pack-year smoking history. He has never used smokeless tobacco. He reports current alcohol use. He reports that he does not use drugs.  Tobacco Cessation: Former smoker quit 2009 with a 97.5 pack year smoking history.  Past surgical hx, Family hx, Social hx all reviewed.  Current Outpatient Medications on File Prior to Visit  Medication Sig  . alendronate (FOSAMAX) 70 MG tablet TAKE 1 TAB ONCE A WEEK, AT LEAST 30 MIN BEFORE 1ST FOOD.DO NOT LIE DOWN FOR 30  MIN AFTER TAKING.  Marland Kitchen allopurinol (ZYLOPRIM) 100 MG tablet TAKE 1 TABLET ONCE DAILY.  Marland Kitchen amiodarone (PACERONE) 200 MG tablet Take 0.5 tablets (100 mg total) by mouth daily.  Marland Kitchen aspirin EC 81 MG tablet Take 81 mg by mouth daily.  . budesonide (PULMICORT) 0.5 MG/2ML nebulizer solution Take 2 mLs (0.5 mg total) by nebulization 2 (two) times daily.  . carvedilol (COREG) 3.125 MG tablet TAKE 1 TABLET BY MOUTH TWICE DAILY WITH A MEAL. (Patient taking differently: As directed)  . cholecalciferol (VITAMIN D) 1000 units tablet Take 1,000 Units by mouth daily.   . fluticasone (CUTIVATE)  0.05 % cream Apply 1 application topically 2 (two) times daily.   . formoterol (PERFOROMIST) 20 MCG/2ML nebulizer solution Take 2 mLs (20 mcg total) by nebulization 2 (two) times daily.  . furosemide (LASIX) 20 MG tablet TAKE 1 TABLET ONCE DAILY.  Marland Kitchen guaiFENesin (MUCINEX) 600 MG 12 hr tablet Take 600 mg by mouth in the morning, at noon, in the evening, and at bedtime.   Marland Kitchen levothyroxine (SYNTHROID) 125 MCG tablet TAKE 1 TABLET ONCE DAILY.  . magnesium oxide (MAG-OX) 400 (241.3 Mg) MG tablet Take 0.5 tablets (200 mg total) by mouth daily.  . midodrine (PROAMATINE) 2.5 MG tablet Take 1 tablet (2.5 mg total) by mouth 2 (two) times daily with a meal. TAKE 1 TABLET 3 TIMES DAILY WITH MEALS.  . Multiple Vitamins-Minerals (PRESERVISION AREDS 2 PO) Take 1 capsule by mouth daily.  Marland Kitchen oxybutynin (DITROPAN) 5 MG tablet TAKE 1 TABLET BY MOUTH TWICE DAILY.  Marland Kitchen polyethylene glycol (MIRALAX / GLYCOLAX) 17 g packet Take 17 g by mouth daily.  . pravastatin (PRAVACHOL) 40 MG tablet TAKE 1 TABLET ONCE DAILY.  . revefenacin (YUPELRI) 175 MCG/3ML nebulizer solution Take 3 mLs (175 mcg total) by nebulization daily.  . Revefenacin (YUPELRI) 175 MCG/3ML SOLN Inhale 3 mLs into the lungs daily.  . sertraline (ZOLOFT) 50 MG tablet TAKE ONE TABLET AT BEDTIME.  Marland Kitchen thiamine 100 MG tablet Take 1 tablet (100 mg total) by mouth daily.   No current  facility-administered medications on file prior to visit.     Allergies  Allergen Reactions  . Lisinopril     BP dropped too low per daughter    Review Of Systems:  Constitutional:   No  weight loss, night sweats,  Fevers, chills, fatigue, or  lassitude.  HEENT:   No headaches,  Difficulty swallowing,  Tooth/dental problems, or  Sore throat,                No sneezing, itching, ear ache, nasal congestion, post nasal drip,   CV:  No chest pain,  Orthopnea, PND, occasional swelling in lower extremities, anasarca, dizziness, palpitations, syncope.   GI  No heartburn, indigestion, abdominal pain, nausea, vomiting, diarrhea, change in bowel habits, loss of appetite, bloody stools.   Resp: No shortness of breath with exertion or at rest.  No excess mucus, no productive cough,  No non-productive cough,  No coughing up of blood.  No change in color of mucus.  No wheezing.  No chest wall deformity  Skin: no rash or lesions.  GU: no dysuria, change in color of urine, no urgency or frequency.  No flank pain, no hematuria   MS:  No joint pain or swelling.  No decreased range of motion.  No back pain.  Psych:  No change in mood or affect. No depression or anxiety.  No memory loss.   Vital Signs BP 122/74 (BP Location: Left Arm, Cuff Size: Normal)   Pulse 74   Temp 97.6 F (36.4 C) (Oral)   Ht 5\' 11"  (1.803 m)   Wt 178 lb 6.4 oz (80.9 kg)   SpO2 95%   BMI 24.88 kg/m    Physical Exam:  General- No distress,  A&Ox3, pleasant ENT: No sinus tenderness, TM clear, pale nasal mucosa, no oral exudate,no post nasal drip, no LAN Cardiac: S1, S2, regular rate and rhythm, no murmur Chest: No wheeze/ rales/ dullness; no accessory muscle use, no nasal flaring, no sternal retractions, + crackles right lung base Abd.: Soft Non-tender, ND, BS +  Ext: No clubbing cyanosis, no edema LE Neuro:  normal strength Skin: No rashes, warm and dry, no lesions, L ear with surgical absence of some of  tissue Psych: normal mood and behavior   Assessment/Plan  COPD Flare Resolved with Doxycycline and 2 rounds of prednisone taper Much improvement since using all nebulized medications CXR with no acute cardiopulmonry disease 3/9 Plan Continue Perforomist and budesonide nebulizers twice daily and Yupelri once daily. Rinse mouth after use  Use your rescue inhaler as needed for shortness of breath or wheezing Follow up with Dr. Valeta Harms in 6 months. Please contact office for sooner follow up if symptoms do not improve or worsen or seek emergency care Call us if you need [place] sooner.    Magdalen Spatz, NP 01/30/2021  12:09 PM

## 2021-02-01 ENCOUNTER — Ambulatory Visit (INDEPENDENT_AMBULATORY_CARE_PROVIDER_SITE_OTHER): Payer: Medicare Other

## 2021-02-01 DIAGNOSIS — I472 Ventricular tachycardia, unspecified: Secondary | ICD-10-CM

## 2021-02-01 NOTE — Progress Notes (Signed)
PCCM:  Thanks for seeing him Garner Nash, DO [facility] 02/01/2021 10:55 AM

## 2021-02-02 LAB — CUP PACEART REMOTE DEVICE CHECK
Battery Remaining Longevity: 39 mo
Battery Voltage: 2.96 V
Brady Statistic AP VP Percent: 0.05 %
Brady Statistic AP VS Percent: 94.7 %
Brady Statistic AS VP Percent: 0.03 %
Brady Statistic AS VS Percent: 5.22 %
Brady Statistic RA Percent Paced: 94.74 %
Brady Statistic RV Percent Paced: 0.07 %
Date Time Interrogation Session: 20220413044224
HighPow Impedance: 44 Ohm
HighPow Impedance: 59 Ohm
Implantable Lead Implant Date: 20090623
Implantable Lead Implant Date: 20090623
Implantable Lead Location: 753859
Implantable Lead Location: 753860
Implantable Lead Model: 5076
Implantable Lead Model: 6947
Implantable Pulse Generator Implant Date: 20170616
Lead Channel Impedance Value: 285 Ohm
Lead Channel Impedance Value: 342 Ohm
Lead Channel Impedance Value: 418 Ohm
Lead Channel Pacing Threshold Amplitude: 1 V
Lead Channel Pacing Threshold Amplitude: 1.125 V
Lead Channel Pacing Threshold Pulse Width: 0.4 ms
Lead Channel Pacing Threshold Pulse Width: 0.4 ms
Lead Channel Sensing Intrinsic Amplitude: 1.125 mV
Lead Channel Sensing Intrinsic Amplitude: 1.125 mV
Lead Channel Sensing Intrinsic Amplitude: 7.25 mV
Lead Channel Sensing Intrinsic Amplitude: 7.25 mV
Lead Channel Setting Pacing Amplitude: 2.25 V
Lead Channel Setting Pacing Amplitude: 2.5 V
Lead Channel Setting Pacing Pulse Width: 0.4 ms
Lead Channel Setting Sensing Sensitivity: 0.6 mV

## 2021-02-07 DIAGNOSIS — D0472 Carcinoma in situ of skin of left lower limb, including hip: Secondary | ICD-10-CM | POA: Diagnosis not present

## 2021-02-07 DIAGNOSIS — C44329 Squamous cell carcinoma of skin of other parts of face: Secondary | ICD-10-CM | POA: Diagnosis not present

## 2021-02-07 DIAGNOSIS — D0422 Carcinoma in situ of skin of left ear and external auricular canal: Secondary | ICD-10-CM | POA: Diagnosis not present

## 2021-02-07 DIAGNOSIS — L02222 Furuncle of back [any part, except buttock]: Secondary | ICD-10-CM | POA: Diagnosis not present

## 2021-02-07 DIAGNOSIS — L820 Inflamed seborrheic keratosis: Secondary | ICD-10-CM | POA: Diagnosis not present

## 2021-02-07 DIAGNOSIS — B9689 Other specified bacterial agents as the cause of diseases classified elsewhere: Secondary | ICD-10-CM | POA: Diagnosis not present

## 2021-02-16 NOTE — Progress Notes (Signed)
Remote ICD transmission.   

## 2021-02-19 ENCOUNTER — Other Ambulatory Visit: Payer: Self-pay | Admitting: Rheumatology

## 2021-02-20 ENCOUNTER — Ambulatory Visit (INDEPENDENT_AMBULATORY_CARE_PROVIDER_SITE_OTHER): Payer: Medicare Other

## 2021-02-20 DIAGNOSIS — I5042 Chronic combined systolic (congestive) and diastolic (congestive) heart failure: Secondary | ICD-10-CM

## 2021-02-20 DIAGNOSIS — Z95810 Presence of automatic (implantable) cardiac defibrillator: Secondary | ICD-10-CM

## 2021-02-20 NOTE — Telephone Encounter (Signed)
Next Visit: 03/22/2021  Last Visit: 09/20/2020  Last Fill: 11/21/2020  DX: Age-related osteoporosis with current pathological fracture with routine healing, subsequent encounter   Current Dose per office note 09/20/2020,  Fosamax 70 mg 1 tablet by mouth once a week  Labs: 11/28/2020, RBC 4.08, MCH 34.3, Eosinophils 2.0, Creatinine 1.26, Albumin 3.2, GFR  55  Okay to refill Fosamax?

## 2021-02-22 NOTE — Progress Notes (Signed)
EPIC Encounter for ICM Monitoring  Patient Name: [name] is a 87 y.o. male Date: 02/22/2021 Primary Care Physican: Caren Macadam, MD Primary Cardiologist: Hochrein Electrophysiologist: Faustino Congress Weight:unknown  Spoke with patient and reports feeling well at this time.  Denies fluid symptoms.    Optivol thoracic impedancesuggestingnormal fluid levels.  Prescribed:  Furosemide20 mg 1 tabletdaily.  Labs: 11/30/2021Creatinine 1.23, BUN 21, Potassium 4.4, Sodium 140, GFR 52-61 07/01/2020 Creatinine1.39, BUN21, Potassium4.9, Sodium140, GFR45-52 06/15/2020 Creatinine1.33, BUN20, Potassium5.5, Sodium140, VHQ46-96 A complete set of results can be found in Results Review.  Recommendations:No changes and encouraged to call if experiencing any fluid symptoms.  Follow-up plan: ICM clinic phone appointment on6/13/2022. 91 day device clinic remote transmission7/13/2022.   EP/Cardiology Office Visits:  07/04/2021 with Dr Percival Spanish.  Recall 10/2/2022with Dr.Klein.  Copy of ICM check sent to [place].   3 month ICM trend: 02/20/2021.    1 Year ICM trend:       [name], RN 02/22/2021 12:38 PM

## 2021-03-07 DIAGNOSIS — L820 Inflamed seborrheic keratosis: Secondary | ICD-10-CM | POA: Diagnosis not present

## 2021-03-07 DIAGNOSIS — C44529 Squamous cell carcinoma of skin of other part of trunk: Secondary | ICD-10-CM | POA: Diagnosis not present

## 2021-03-07 DIAGNOSIS — Z08 Encounter for follow-up examination after completed treatment for malignant neoplasm: Secondary | ICD-10-CM | POA: Diagnosis not present

## 2021-03-07 DIAGNOSIS — Z85828 Personal history of other malignant neoplasm of skin: Secondary | ICD-10-CM | POA: Diagnosis not present

## 2021-03-07 DIAGNOSIS — L821 Other seborrheic keratosis: Secondary | ICD-10-CM | POA: Diagnosis not present

## 2021-03-08 ENCOUNTER — Other Ambulatory Visit: Payer: Self-pay | Admitting: Cardiology

## 2021-03-08 NOTE — Progress Notes (Signed)
Office Visit Note  Patient: [name]             Date of Birth: 09-Aug-1931           MRN: [id number]             PCP: Caren Macadam, MD Referring: Caren Macadam, MD Visit Date: 03/22/2021 Occupation: @GUAROCC @  Subjective:  Medication monitoring.   History of Present Illness: [name] is a 87 y.o. male with a history of osteoporosis.  He states he has been taking Fosamax on a regular basis.  He states he does not know most of his medications and his medications are given to him by his daughter.  He denies any joint pain or joint swelling.  Activities of Daily Living:  Patient reports morning stiffness for 0 minutes.   Patient Denies nocturnal pain.  Difficulty dressing/grooming: Denies Difficulty climbing stairs: Reports Difficulty getting out of chair: Reports Difficulty using hands for taps, buttons, cutlery, and/or writing: Denies  Review of Systems  Constitutional: Positive for fatigue.  HENT: Negative for mouth sores, mouth dryness and nose dryness.   Eyes: Negative for pain, itching and dryness.  Respiratory: Positive for shortness of breath. Negative for difficulty breathing.   Cardiovascular: Negative for chest pain and palpitations.  Gastrointestinal: Positive for constipation. Negative for blood in stool and diarrhea.  Endocrine: Negative for increased urination.  Genitourinary: Negative for difficulty urinating.  Musculoskeletal: Positive for joint swelling. Negative for arthralgias, joint pain, myalgias, morning stiffness, muscle tenderness and myalgias.  Skin: Negative for color change and redness.  Allergic/Immunologic: Negative for susceptible to infections.  Neurological: Positive for dizziness. Negative for numbness and headaches.  Hematological: Positive for bruising/bleeding tendency.  Psychiatric/Behavioral: Negative for sleep disturbance.    PMFS History:  Patient Active Problem List   Diagnosis Date Noted  . Coronary  artery disease involving native coronary artery of native heart without angina pectoris 09/30/2019  . Dyslipidemia 09/30/2019  . Educated about COVID-19 virus infection 03/20/2019  . VT (ventricular tachycardia) ([place]) 03/20/2019  . Chronic hypoxemic respiratory failure ([place]) 08/26/2018  . Gait abnormality 08/26/2018  . Lobar pneumonia, unspecified organism ([place]) 08/04/2018  . Lower GI bleeding 07/21/2018  . Forehead laceration, initial encounter 07/21/2018  . CKD (chronic kidney disease) stage 3, GFR 30-59 ml/min (HCC) 07/21/2018  . History of pneumonia 07/15/2018  . Physical deconditioning 11/13/2017  . Weakness 11/12/2017  . Falls 11/12/2017  . Pleural effusion   . CHF exacerbation ([place]) 09/30/2017  . Shingles outbreak 05/27/2017  . Seborrheic dermatitis 05/27/2017  . Acute on chronic respiratory failure with hypoxia ([place]) 01/13/2017  . COPD with acute bronchitis ([place]) 01/13/2017  . CAD S/P percutaneous coronary angioplasty   . Chronic combined systolic and diastolic CHF (congestive heart failure) ([place])   . Blunt chest trauma 05/04/2016  . 4.1 cm Aneurysm of thoracic aorta ([place]) 04/27/2016  . SIADH (syndrome of inappropriate ADH production) ([place])   . Orthostatic hypotension 04/12/2016  . TBI (traumatic brain injury) ([place])   . Acute on chronic combined systolic and diastolic congestive heart failure ([place])   . S/P ORIF (open reduction internal fixation) fracture   . Thrombocytopenia ([place])   . Urinary retention   . Slow transit constipation   . Thyroid activity decreased   . Traumatic subdural hematoma with loss of consciousness ([place])   . Pulmonary hypertension ([place])   . Cardiomyopathy, ischemic   . Subdural hematoma ([place])   . Cardiac arrest ([place]) 03/29/2016  .  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 T wave over sensing resulting in inappropriate shocks 08/14/2013  . Vitamin D deficiency disease 04/09/2012  . Actinic skin damage 05/22/2011  . Alcohol use  03/22/2011  . Other dysphagia 02/16/2011  . Carotid arterial disease ([place]) 11/17/2009  . GOUT 09/23/2009  . Age-related osteoporosis with current pathological fracture 09/23/2009  . SYNCOPE 04/19/2009  . Automatic implantable cardioverter-defibrillator in situ 04/13/2008  . COLONIC POLYPS 12/09/2007  . Diverticulosis of colon 12/09/2007  . Essential hypertension 12/08/2007  . Peripheral vascular disease ([place]) 12/08/2007  . COPD GOLD III 12/08/2007  . Osteoarthritis 12/08/2007    Past Medical History:  Diagnosis Date  . AAA (abdominal aortic aneurysm) ([place])    a. 2002 s/p repair.  . Allergic rhinitis   . Back pain   . CAD (coronary artery disease)    a. 02/2002 H/o MI with stenting x 2; b. 04/2013 MV: EF 25%, large posterior lateral infarct w/o ischemia; c. 03/2016 VT Arrest/Cath: LM 60ost, LAD 40p/m ISR, LCX 100p/m, RCA nl-->Med Rx.  . Carotid arterial disease ([place])    a. 12/2010 s/p R CEA;  b. 05/2015 Carotid U/S: bilat <40% ICA stenosis.  . Chronic combined systolic and diastolic CHF (congestive heart failure) ([place])    a. 03/2016 Echo: Ef 35-40%, grade 1 DD.  Marland Kitchen Compression fracture   . COPD (chronic obstructive pulmonary disease) ([place])   . Dermatitis   . Diverticulosis of colon   . DJD (degenerative joint disease)    and Gout  . Hemorrhoids   . History of colonic polyps   . History of gout   . History of pneumonia   . Hypercholesterolemia   . Hypertension   . Hypertensive heart disease   . Ischemic cardiomyopathy    a. EF prev <35%-->improved to normal by Echo 8/14:  Mild LVH, focal basal hypertrophy, EF 60-65%, normal wall motion, mild BAE, PASP 36; c. 03/2016 Echo: EF 35-40%, Gr1 DD, triv AI, mild MR, mod dil LA, mild-mod TR, PASP 50mmHg.  . Osteoporosis   . Peripheral vascular disease ([place])   . Presence of cardiac defibrillator    a. 03/2008 s/p MDT [id number] Maximo II DR, DC AICD; b. 03/2013: ICD shock for T wave oversensing;  c. 10/2015: collective decision not to replace ICD  given improvement in LV fxn; d. VT Arrest-->Gen change to MDT ser # [id number].  . Skin cancer    shoulders and forehead  . Syncope   . T wave over sensing resulting in inappropriate shocks    a. 03/2013.  . Tobacco abuse   . Ventricular tachycardia ([place]) 04/01/2016    Family History  Problem Relation Age of Onset  . Hypertension Father   . Heart disease Father 83       Heart Disease before age 110  . Alcohol abuse Father        causing multiple medical problems; uncertain exact cause of death  . Hypertension Mother   . Heart disease Mother        Heart Disease before age 45  . Cancer Mother        pancreatic  . Scoliosis Sister   . Thyroid disease Daughter   . Anxiety disorder Son    Past Surgical History:  Procedure Laterality Date  . ABDOMINAL AORTIC ANEURYSM REPAIR  2002   by Dr. Kellie Simmering  . aicd placed  03/2008   Dr. Caryl Comes  . cad stent  02/2002   Dr. Percival Spanish  .  CARDIAC CATHETERIZATION     X 2 stents  . CARDIAC CATHETERIZATION N/A 04/03/2016   Procedure: Left Heart Cath and Coronary Angiography;  Surgeon: Belva Crome, MD;  Location: [facility];  Service: Cardiovascular;  Laterality: N/A;  . CARDIAC DEFIBRILLATOR PLACEMENT  2009  . CARDIAC DEFIBRILLATOR PLACEMENT    . CAROTID ENDARTERECTOMY Right January 02, 2011  . EAR CYST EXCISION Left 12/13/2015   Procedure: Excision left ear lesion ;  Surgeon: Melissa Montane, MD;  Location: [facility];  Service: ENT;  Laterality: Left;  . EP IMPLANTABLE DEVICE N/A 04/06/2016   Procedure:  ICD Generator Changeout;  Surgeon: Deboraha Sprang, MD;  Location: [facility];  Service: Cardiovascular;  Laterality: N/A;  . EXCISION OF LESION LEFT EAR Left 12/13/2015  . EXTERNAL EAR SURGERY Left    Cancer Removal from left ear  . I & D EXTREMITY Left 04/23/2016   Procedure: IRRIGATION AND DEBRIDEMENT HAND;  Surgeon: Leandrew Koyanagi, MD;  Location: [facility];  Service: Orthopedics;  Laterality: Left;  . IR IMAGE GUIDED DRAINAGE BY PERCUTANEOUS  CATHETER  10/08/2017  . lumbar back     done after compression fracture  . OPEN REDUCTION INTERNAL FIXATION (ORIF) METACARPAL Left 04/04/2016   Procedure: OPEN REDUCTION INTERNAL FIXATION (ORIF) LEFT 2ND, 3RD, 4TH METACARPAL FRACTURE;  Surgeon: Leandrew Koyanagi, MD;  Location: [facility];  Service: Orthopedics;  Laterality: Left;  OPEN REDUCTION INTERNAL FIXATION (ORIF) LEFT 2ND, 3RD, 4TH METACARPAL FRACTURE  . OPEN REDUCTION INTERNAL FIXATION (ORIF) METACARPAL Left 04/23/2016   Procedure: REVISION OPEN REDUCTION INTERNAL FIXATION (ORIF) 2ND METACARPAL;  Surgeon: Leandrew Koyanagi, MD;  Location: [facility];  Service: Orthopedics;  Laterality: Left;  . right corotid enderectomy  12/2010   Dr. Scot Dock  . SKIN SPLIT GRAFT Left 12/13/2015   Procedure: with possible skin graft;  Surgeon: Melissa Montane, MD;  Location: [facility];  Service: ENT;  Laterality: Left;  . SQUAMOUS CELL CARCINOMA EXCISION     on back, forehead, arm  . TONSILLECTOMY     Social History   Social History Narrative   ** Merged History Encounter **       Lives locally.  Had been living with wife who had become quite ill recently and died on the evening of March 31, 2016.   Immunization History  Administered Date(s) Administered  . Fluad Quad(high Dose 65+) 07/31/2019, 07/25/2020  . H1N1 10/26/2008  . Influenza Split 08/23/2011, 07/15/2012  . Influenza Whole 07/14/2008, 08/22/2009, 09/04/2010  . Influenza, High Dose Seasonal PF 07/09/2016, 09/18/2017, 07/31/2018  . Influenza,inj,Quad PF,6+ Mos 07/07/2013, 07/07/2014, 07/23/2015  . PFIZER(Purple Top)SARS-COV-2 Vaccination 11/12/2019, 12/03/2019, 07/26/2020  . Pneumococcal Conjugate-13 10/03/2015  . Pneumococcal Polysaccharide-23 07/12/2009  . Tdap 02/10/2018     Objective: Vital Signs: BP (!) 92/54 (BP Location: Left Arm, Patient Position: Sitting, Cuff Size: Normal)   Pulse 73   Ht 5' 8.25" (1.734 m)   Wt 180 lb 9.6 oz (81.9 kg)   BMI 27.26 kg/m    Physical Exam Vitals and nursing note  reviewed.  Constitutional:      Appearance: He is well-developed.  HENT:     Head: Normocephalic and atraumatic.  Eyes:     Conjunctiva/sclera: Conjunctivae normal.     Pupils: Pupils are equal, round, and reactive to light.  Cardiovascular:     Rate and Rhythm: Normal rate and regular rhythm.     Heart sounds: Normal heart sounds.  Pulmonary:     Effort: Pulmonary effort is  normal.     Breath sounds: Normal breath sounds.  Abdominal:     General: Bowel sounds are normal.     Palpations: Abdomen is soft.  Musculoskeletal:     Cervical back: Normal range of motion and neck supple.  Skin:    General: Skin is warm and dry.     Capillary Refill: Capillary refill takes less than 2 seconds.  Neurological:     Mental Status: He is alert and oriented to person, place, and time.  Psychiatric:        Behavior: Behavior normal.      Musculoskeletal Exam: C-spine was in good range of motion.  He has thoracic kyphosis.  Shoulder joints, elbow joints, wrist joints, MCPs PIPs and DIPs with good range of motion with no synovitis.  Hip joints were difficult to assess in the sitting position.  He mobilizes with the help of a cane.  Knee joints with good range of motion.  There was no tenderness over ankles or MTPs.  CDAI Exam: CDAI Score: -- Patient Global: --; Provider Global: -- Swollen: --; Tender: -- Joint Exam 03/22/2021   No joint exam has been documented for this visit   There is currently no information documented on the homunculus. Go to the Rheumatology activity and complete the homunculus joint exam.  Investigation: No additional findings.  Imaging: No results found.  Recent Labs: Lab Results  Component Value Date   WBC 7.8 11/28/2020   HGB 14.0 11/28/2020   PLT 152 11/28/2020   NA 137 11/28/2020   K 4.3 11/28/2020   CL 101 11/28/2020   CO2 26 11/28/2020   GLUCOSE 93 11/28/2020   BUN 18 11/28/2020   CREATININE 1.26 (H) 11/28/2020   BILITOT 0.6 11/28/2020    ALKPHOS 80 11/28/2020   AST 33 11/28/2020   ALT 19 11/28/2020   PROT 7.4 11/28/2020   ALBUMIN 3.2 (L) 11/28/2020   CALCIUM 9.1 11/28/2020   GFRAA 61 09/20/2020    Speciality Comments: No specialty comments available.  Procedures:  No procedures performed Allergies: Lisinopril   Assessment / Plan:     Visit Diagnoses: Age-related osteoporosis with current pathological fracture with routine healing, subsequent encounter - DEXA on 02/17/2018 right femoral neck T score -2.7 with an increase of 11.4% when compared to previous DEXA. Fosamax 70 mg 1 tablet by mouth once a week.  Patient states that he has been tolerating all his medications well.  Medication monitoring encounter-his labs from November 28, 2020 were within normal limits.  I advised him to come back in August for CMP with GFR.  Vitamin D deficiency disease-he was advised to take vitamin D 2000 units daily.  Closed compression fracture of L3 lumbar vertebra, sequela-he does not have any lower back discomfort today.  Idiopathic chronic gout of multiple sites without tophus - Allopurinol 100 mg daily, prescribed by his cardiologist.  He denies having any gout flare.  History of COPD  Automatic implantable cardioverter-defibrillator in situ  COPD GOLD III  History of diverticulosis  Peripheral vascular disease (HCC)  SIADH (syndrome of inappropriate ADH production) (HCC)  CAD S/P percutaneous coronary angioplasty  Basal cell carcinoma (BCC) of auricle of left ear  Cardiomyopathy, ischemic  Essential hypertension  History of traumatic brain injury  Chronic combined systolic and diastolic CHF (congestive heart failure) ([place])  History of subdural hematoma  Pulmonary hypertension ([place])  Former smoker  Orders: No orders of the defined types were placed in this encounter.  No orders of the  defined types were placed in this encounter.    Follow-Up Instructions: Return in about 1 year (around 03/22/2022) for  Osteoporosis.   Bo Merino, MD  Note - This record has been created using Editor, commissioning.  Chart creation errors have been sought, but may not always  have been located. Such creation errors do not reflect on  the standard of medical care.

## 2021-03-22 ENCOUNTER — Other Ambulatory Visit: Payer: Self-pay

## 2021-03-22 ENCOUNTER — Ambulatory Visit (INDEPENDENT_AMBULATORY_CARE_PROVIDER_SITE_OTHER): Payer: Medicare Other | Admitting: Rheumatology

## 2021-03-22 ENCOUNTER — Encounter: Payer: Self-pay | Admitting: Rheumatology

## 2021-03-22 VITALS — BP 92/54 | HR 73 | Ht 68.25 in | Wt 180.6 lb

## 2021-03-22 DIAGNOSIS — M8000XD Age-related osteoporosis with current pathological fracture, unspecified site, subsequent encounter for fracture with routine healing: Secondary | ICD-10-CM | POA: Diagnosis not present

## 2021-03-22 DIAGNOSIS — E559 Vitamin D deficiency, unspecified: Secondary | ICD-10-CM

## 2021-03-22 DIAGNOSIS — Z8719 Personal history of other diseases of the digestive system: Secondary | ICD-10-CM | POA: Diagnosis not present

## 2021-03-22 DIAGNOSIS — Z5181 Encounter for therapeutic drug level monitoring: Secondary | ICD-10-CM

## 2021-03-22 DIAGNOSIS — Z8679 Personal history of other diseases of the circulatory system: Secondary | ICD-10-CM

## 2021-03-22 DIAGNOSIS — I739 Peripheral vascular disease, unspecified: Secondary | ICD-10-CM | POA: Diagnosis not present

## 2021-03-22 DIAGNOSIS — J449 Chronic obstructive pulmonary disease, unspecified: Secondary | ICD-10-CM | POA: Diagnosis not present

## 2021-03-22 DIAGNOSIS — E222 Syndrome of inappropriate secretion of antidiuretic hormone: Secondary | ICD-10-CM | POA: Diagnosis not present

## 2021-03-22 DIAGNOSIS — I10 Essential (primary) hypertension: Secondary | ICD-10-CM

## 2021-03-22 DIAGNOSIS — Z95810 Presence of automatic (implantable) cardiac defibrillator: Secondary | ICD-10-CM

## 2021-03-22 DIAGNOSIS — Z87891 Personal history of nicotine dependence: Secondary | ICD-10-CM

## 2021-03-22 DIAGNOSIS — I272 Pulmonary hypertension, unspecified: Secondary | ICD-10-CM

## 2021-03-22 DIAGNOSIS — I2510 Atherosclerotic heart disease of native coronary artery without angina pectoris: Secondary | ICD-10-CM

## 2021-03-22 DIAGNOSIS — I5042 Chronic combined systolic (congestive) and diastolic (congestive) heart failure: Secondary | ICD-10-CM

## 2021-03-22 DIAGNOSIS — S32030S Wedge compression fracture of third lumbar vertebra, sequela: Secondary | ICD-10-CM

## 2021-03-22 DIAGNOSIS — Z9861 Coronary angioplasty status: Secondary | ICD-10-CM

## 2021-03-22 DIAGNOSIS — Z8709 Personal history of other diseases of the respiratory system: Secondary | ICD-10-CM | POA: Diagnosis not present

## 2021-03-22 DIAGNOSIS — M1A09X0 Idiopathic chronic gout, multiple sites, without tophus (tophi): Secondary | ICD-10-CM

## 2021-03-22 DIAGNOSIS — C44219 Basal cell carcinoma of skin of left ear and external auricular canal: Secondary | ICD-10-CM

## 2021-03-22 DIAGNOSIS — Z87820 Personal history of traumatic brain injury: Secondary | ICD-10-CM

## 2021-03-22 DIAGNOSIS — I255 Ischemic cardiomyopathy: Secondary | ICD-10-CM

## 2021-03-22 NOTE — Patient Instructions (Signed)
Please return in August for lab work.

## 2021-04-03 ENCOUNTER — Ambulatory Visit (INDEPENDENT_AMBULATORY_CARE_PROVIDER_SITE_OTHER): Payer: Medicare Other

## 2021-04-03 DIAGNOSIS — Z95810 Presence of automatic (implantable) cardiac defibrillator: Secondary | ICD-10-CM

## 2021-04-03 DIAGNOSIS — I5042 Chronic combined systolic (congestive) and diastolic (congestive) heart failure: Secondary | ICD-10-CM

## 2021-04-05 ENCOUNTER — Telehealth: Payer: Self-pay

## 2021-04-05 NOTE — Progress Notes (Signed)
EPIC Encounter for ICM Monitoring  Patient Name: [name] is a 87 y.o. male Date: 04/05/2021 Primary Care Physican: Caren Macadam, MD Primary Cardiologist: Hochrein Electrophysiologist: Faustino Congress Weight:  unknown                                                   Attempted call to daughter, [name], per [facility].  Left detailed message per DPR regarding transmission. Transmission reviewed.    Optivol thoracic impedance suggesting normal fluid levels.     Prescribed: Furosemide 20 mg 1 tablet daily.      Labs: 11/28/2020 Creatinine 1.26, BUN 18, Potassium 4.3, Sodium 137, GFR 55 A complete set of results can be found in Results Review.   Recommendations:  Left voice mail with ICM number and encouraged to call if experiencing any fluid symptoms.   Follow-up plan: ICM clinic phone appointment on 05/08/2021.   91 day device clinic remote transmission 05/03/2021.     EP/Cardiology Office Visits:  07/04/2021 with Dr Percival Spanish.  Recall 07/23/2021 with Dr. Caryl Comes.   Copy of ICM check sent to Dr. Caryl Comes.    3 month ICM trend: 04/03/2021.    1 Year ICM trend:       [name], RN 04/05/2021 1:17 PM

## 2021-04-05 NOTE — Telephone Encounter (Signed)
Remote ICM transmission received.  Attempted call to daughter, [name], [place] regarding ICM remote transmission and left detailed message per DPR.  Advised to return call for any fluid symptoms or questions. Next ICM remote transmission scheduled 05/08/2021.

## 2021-04-26 DIAGNOSIS — C44529 Squamous cell carcinoma of skin of other part of trunk: Secondary | ICD-10-CM | POA: Diagnosis not present

## 2021-04-28 MED ORDER — MIDODRINE HCL 2.5 MG PO TABS: 2.5000 mg | ORAL_TABLET | Freq: Two times a day (BID) | ORAL | 3 refills | Status: DC

## 2021-04-28 NOTE — Addendum Note (Signed)
Addended by: Thora Lance on: 04/28/2021 08:36 AM   Modules accepted: Orders

## 2021-05-03 ENCOUNTER — Ambulatory Visit (INDEPENDENT_AMBULATORY_CARE_PROVIDER_SITE_OTHER): Payer: Medicare Other

## 2021-05-03 DIAGNOSIS — I255 Ischemic cardiomyopathy: Secondary | ICD-10-CM

## 2021-05-03 LAB — CUP PACEART REMOTE DEVICE CHECK
Battery Remaining Longevity: 36 mo
Battery Voltage: 2.96 V
Brady Statistic AP VP Percent: 0.07 %
Brady Statistic AP VS Percent: 98.32 %
Brady Statistic AS VP Percent: 0.01 %
Brady Statistic AS VS Percent: 1.61 %
Brady Statistic RA Percent Paced: 98.32 %
Brady Statistic RV Percent Paced: 0.09 %
Date Time Interrogation Session: 20220713022724
HighPow Impedance: 46 Ohm
HighPow Impedance: 61 Ohm
Implantable Lead Implant Date: 20090623
Implantable Lead Implant Date: 20090623
Implantable Lead Location: 753859
Implantable Lead Location: 753860
Implantable Lead Model: 5076
Implantable Lead Model: 6947
Implantable Pulse Generator Implant Date: 20170616
Lead Channel Impedance Value: 304 Ohm
Lead Channel Impedance Value: 342 Ohm
Lead Channel Impedance Value: 399 Ohm
Lead Channel Pacing Threshold Amplitude: 1 V
Lead Channel Pacing Threshold Amplitude: 1 V
Lead Channel Pacing Threshold Pulse Width: 0.4 ms
Lead Channel Pacing Threshold Pulse Width: 0.4 ms
Lead Channel Sensing Intrinsic Amplitude: 0.75 mV
Lead Channel Sensing Intrinsic Amplitude: 0.75 mV
Lead Channel Sensing Intrinsic Amplitude: 6.25 mV
Lead Channel Sensing Intrinsic Amplitude: 6.25 mV
Lead Channel Setting Pacing Amplitude: 2 V
Lead Channel Setting Pacing Amplitude: 2.5 V
Lead Channel Setting Pacing Pulse Width: 0.4 ms
Lead Channel Setting Sensing Sensitivity: 0.6 mV

## 2021-05-07 ENCOUNTER — Other Ambulatory Visit: Payer: Self-pay | Admitting: Family Medicine

## 2021-05-07 ENCOUNTER — Other Ambulatory Visit: Payer: Self-pay | Admitting: Physician Assistant

## 2021-05-08 ENCOUNTER — Ambulatory Visit (INDEPENDENT_AMBULATORY_CARE_PROVIDER_SITE_OTHER): Payer: Medicare Other

## 2021-05-08 DIAGNOSIS — I5042 Chronic combined systolic (congestive) and diastolic (congestive) heart failure: Secondary | ICD-10-CM | POA: Diagnosis not present

## 2021-05-08 DIAGNOSIS — Z95810 Presence of automatic (implantable) cardiac defibrillator: Secondary | ICD-10-CM | POA: Diagnosis not present

## 2021-05-08 NOTE — Telephone Encounter (Signed)
Patient will call to schedule closer to date due for return appointment.

## 2021-05-08 NOTE — Telephone Encounter (Signed)
Please schedule patient for a follow up visit. Patient due June 2023. Thanks! 

## 2021-05-08 NOTE — Telephone Encounter (Signed)
Next Visit: due June 2023. Message sent to the front to schedule.   Last Visit: 03/22/2021  Last Fill: 02/20/2021  DX: Age-related osteoporosis with current pathological fracture with routine healing, subsequent encounter   Current Dose per office note 03/22/2021: Fosamax 70 mg 1 tablet by mouth once a week.    Labs: 11/28/2020 Creat. 1.26, GFR 55, Albumin 3.2, RBC 4.08, MCH 34.3, Eosinophils Absolute 2.0  Okay to refill Fosamax?

## 2021-05-12 NOTE — Progress Notes (Signed)
EPIC Encounter for ICM Monitoring  Patient Name: [name] is a 87 y.o. male Date: 05/12/2021 Primary Care Physican: Caren Macadam, MD Primary Cardiologist: Hochrein Electrophysiologist: Faustino Congress Weight:  unknown                                                   Transmission reviewed.   Optivol thoracic impedance suggesting normal fluid levels.     Prescribed: Furosemide 20 mg 1 tablet daily.      Labs: 11/28/2020 Creatinine 1.26, BUN 18, Potassium 4.3, Sodium 137, GFR 55 A complete set of results can be found in Results Review.   Recommendations:  No changes.   Follow-up plan: ICM clinic phone appointment on 06/12/2021.   91 day device clinic remote transmission 07/04/2021.     EP/Cardiology Office Visits:  07/04/2021 with Dr Percival Spanish.  Recall 08/16/2021 with Dr. Caryl Comes.   Copy of ICM check sent to Dr. Caryl Comes.   3 month ICM trend: 05/08/2021.    1 Year ICM trend:       [name], RN 05/12/2021 5:19 PM

## 2021-05-23 DIAGNOSIS — H31092 Other chorioretinal scars, left eye: Secondary | ICD-10-CM | POA: Diagnosis not present

## 2021-05-23 DIAGNOSIS — H43812 Vitreous degeneration, left eye: Secondary | ICD-10-CM | POA: Diagnosis not present

## 2021-05-23 DIAGNOSIS — H35372 Puckering of macula, left eye: Secondary | ICD-10-CM | POA: Diagnosis not present

## 2021-05-23 DIAGNOSIS — H3581 Retinal edema: Secondary | ICD-10-CM | POA: Diagnosis not present

## 2021-05-26 NOTE — Progress Notes (Signed)
Remote ICD transmission.   

## 2021-06-05 DIAGNOSIS — H903 Sensorineural hearing loss, bilateral: Secondary | ICD-10-CM | POA: Diagnosis not present

## 2021-06-05 DIAGNOSIS — H6123 Impacted cerumen, bilateral: Secondary | ICD-10-CM | POA: Diagnosis not present

## 2021-06-05 DIAGNOSIS — Z85828 Personal history of other malignant neoplasm of skin: Secondary | ICD-10-CM | POA: Diagnosis not present

## 2021-06-05 DIAGNOSIS — Z974 Presence of external hearing-aid: Secondary | ICD-10-CM | POA: Diagnosis not present

## 2021-06-05 DIAGNOSIS — F17290 Nicotine dependence, other tobacco product, uncomplicated: Secondary | ICD-10-CM | POA: Diagnosis not present

## 2021-06-06 DIAGNOSIS — Z08 Encounter for follow-up examination after completed treatment for malignant neoplasm: Secondary | ICD-10-CM | POA: Diagnosis not present

## 2021-06-06 DIAGNOSIS — Z85828 Personal history of other malignant neoplasm of skin: Secondary | ICD-10-CM | POA: Diagnosis not present

## 2021-06-12 ENCOUNTER — Ambulatory Visit (INDEPENDENT_AMBULATORY_CARE_PROVIDER_SITE_OTHER): Payer: Medicare Other

## 2021-06-12 DIAGNOSIS — I5042 Chronic combined systolic (congestive) and diastolic (congestive) heart failure: Secondary | ICD-10-CM

## 2021-06-12 DIAGNOSIS — Z95810 Presence of automatic (implantable) cardiac defibrillator: Secondary | ICD-10-CM

## 2021-06-14 ENCOUNTER — Telehealth: Payer: Self-pay

## 2021-06-14 NOTE — Progress Notes (Signed)
EPIC Encounter for ICM Monitoring  Patient Name: [name] is a 87 y.o. male Date: 06/14/2021 Primary Care Physican: Caren Macadam, MD Primary Cardiologist: Hochrein Electrophysiologist: Faustino Congress Weight:  unknown                                                   Attempted call to [name], per [facility] and unable to reach.  Left detailed message per DPR regarding transmission. Transmission reviewed.    Optivol thoracic impedance suggesting possible fluid accumulation starting 8/16 and trending back to baseline on transmission date 8/22.     Prescribed: Furosemide 20 mg 1 tablet daily.      Labs: 11/28/2020 Creatinine 1.26, BUN 18, Potassium 4.3, Sodium 137, GFR 55 A complete set of results can be found in Results Review.   Recommendations:  Left voice mail with ICM number and encouraged to call if experiencing any fluid symptoms.   Follow-up plan: ICM clinic phone appointment on 07/24/2021.   91 day device clinic remote transmission 08/02/2021.     EP/Cardiology Office Visits:  07/04/2021 with Dr Percival Spanish.   08/16/2021 with Dr. Caryl Comes.   Copy of ICM check sent to Dr. Caryl Comes.   3 month ICM trend: 06/12/2021.    1 Year ICM trend:       [name], RN 06/14/2021 9:17 AM

## 2021-06-14 NOTE — Telephone Encounter (Signed)
Remote ICM transmission received.  Attempted call to [name] regarding ICM remote transmission and left detailed message per DPR.  Advised to return call for any fluid symptoms or questions. Next ICM remote transmission scheduled 07/24/2021.

## 2021-06-20 ENCOUNTER — Other Ambulatory Visit: Payer: Self-pay | Admitting: *Deleted

## 2021-06-20 DIAGNOSIS — E559 Vitamin D deficiency, unspecified: Secondary | ICD-10-CM

## 2021-06-20 DIAGNOSIS — M8000XD Age-related osteoporosis with current pathological fracture, unspecified site, subsequent encounter for fracture with routine healing: Secondary | ICD-10-CM

## 2021-06-21 LAB — CBC WITH DIFFERENTIAL/PLATELET
Absolute Monocytes: 475 cells/uL (ref 200–950)
Basophils Absolute: 99 cells/uL (ref 0–200)
Basophils Relative: 1 %
Eosinophils Absolute: 2831 cells/uL — ABNORMAL HIGH (ref 15–500)
Eosinophils Relative: 28.6 %
HCT: 46 % (ref 38.5–50.0)
Hemoglobin: 15.6 g/dL (ref 13.2–17.1)
Lymphs Abs: 1485 cells/uL (ref 850–3900)
MCH: 33.9 pg — ABNORMAL HIGH (ref 27.0–33.0)
MCHC: 33.9 g/dL (ref 32.0–36.0)
MCV: 100 fL (ref 80.0–100.0)
MPV: 10.7 fL (ref 7.5–12.5)
Monocytes Relative: 4.8 %
Neutro Abs: 5009 cells/uL (ref 1500–7800)
Neutrophils Relative %: 50.6 %
Platelets: 126 10*3/uL — ABNORMAL LOW (ref 140–400)
RBC: 4.6 10*6/uL (ref 4.20–5.80)
RDW: 13 % (ref 11.0–15.0)
Total Lymphocyte: 15 %
WBC: 9.9 10*3/uL (ref 3.8–10.8)

## 2021-06-21 LAB — COMPLETE METABOLIC PANEL WITH GFR
AG Ratio: 1 (calc) (ref 1.0–2.5)
ALT: 11 U/L (ref 9–46)
AST: 21 U/L (ref 10–35)
Albumin: 3.9 g/dL (ref 3.6–5.1)
Alkaline phosphatase (APISO): 101 U/L (ref 35–144)
BUN/Creatinine Ratio: 15 (calc) (ref 6–22)
BUN: 22 mg/dL (ref 7–25)
CO2: 30 mmol/L (ref 20–32)
Calcium: 9.9 mg/dL (ref 8.6–10.3)
Chloride: 99 mmol/L (ref 98–110)
Creat: 1.47 mg/dL — ABNORMAL HIGH (ref 0.70–1.22)
Globulin: 3.8 g/dL (calc) — ABNORMAL HIGH (ref 1.9–3.7)
Glucose, Bld: 94 mg/dL (ref 65–99)
Potassium: 4.6 mmol/L (ref 3.5–5.3)
Sodium: 138 mmol/L (ref 135–146)
Total Bilirubin: 0.6 mg/dL (ref 0.2–1.2)
Total Protein: 7.7 g/dL (ref 6.1–8.1)
eGFR: 46 mL/min/{1.73_m2} — ABNORMAL LOW (ref >=60)

## 2021-06-21 LAB — VITAMIN D 25 HYDROXY (VIT D DEFICIENCY, FRACTURES): Vit D, 25-Hydroxy: 31 ng/mL (ref 30–100)

## 2021-06-21 NOTE — Progress Notes (Signed)
Creatinine is elevated-1.47 and GFR is low-46.  Patient remains on Lasix.  Please advise the patient to avoid NSAIDs.   Platelet count remains low but stable.  Absolute eosinophils remains elevated.  Please forward lab work to PCP.

## 2021-06-23 ENCOUNTER — Telehealth: Payer: Self-pay | Admitting: *Deleted

## 2021-06-23 DIAGNOSIS — R944 Abnormal results of kidney function studies: Secondary | ICD-10-CM

## 2021-06-23 NOTE — Telephone Encounter (Signed)
-----   Message from Caren Macadam, MD sent at 06/21/2021  4:57 PM EDT ----- Regarding: FW: LAB RESULTS Slight bump in kidney function on recent bloodwork. Agree with holding nsaid, stay well hydrated and repeat bmp NON-fasting in 2-3 weeks time to recheck.  ----- Message ----- From: Shona Needles, RT Sent: 06/21/2021   9:44 AM EDT To: Caren Macadam, MD Subject: LAB RESULTS                                    Please review mutual patient's lab results. Thank you.

## 2021-06-23 NOTE — Telephone Encounter (Signed)
Spoke with the patient and informed him of the message below.  Lab appt scheduled for 9/23.

## 2021-06-25 ENCOUNTER — Other Ambulatory Visit: Payer: Self-pay | Admitting: Family Medicine

## 2021-06-27 ENCOUNTER — Encounter: Payer: Self-pay | Admitting: Family Medicine

## 2021-06-28 ENCOUNTER — Other Ambulatory Visit: Payer: Self-pay

## 2021-06-28 ENCOUNTER — Ambulatory Visit (INDEPENDENT_AMBULATORY_CARE_PROVIDER_SITE_OTHER): Payer: Medicare Other

## 2021-06-28 DIAGNOSIS — Z79899 Other long term (current) drug therapy: Secondary | ICD-10-CM

## 2021-06-28 DIAGNOSIS — Z0000 Encounter for general adult medical examination without abnormal findings: Secondary | ICD-10-CM | POA: Diagnosis not present

## 2021-06-28 NOTE — Progress Notes (Signed)
Virtual Visit via Telephone Note  I connected with  [name] on 06/28/21 at  8:45 AM EDT by telephone and verified that I am speaking with the correct person using two identifiers.  Medicare Annual Wellness visit completed telephonically due to Covid-19 pandemic.   Persons participating in this call: This Health Coach and this patient.   Location: Patient: Home Provider: [place]   I discussed the limitations, risks, security and privacy concerns of performing an evaluation and management service by telephone and the availability of in person appointments. The patient expressed understanding and agreed to proceed.  Unable to perform video visit due to video visit attempted and failed and/or patient does not have video capability.   Some vital signs may be absent or patient reported.   Willette Brace, LPN   Subjective:   [name] is a 87 y.o. male who presents for Medicare Annual/Subsequent preventive examination.  Review of Systems     Cardiac Risk Factors include: advanced age (>62mn, >>14women);hypertension;dyslipidemia;sedentary lifestyle;male gender     Objective:    There were no vitals filed for this visit. There is no height or weight on file to calculate BMI.  Advanced Directives 06/28/2021 06/15/2020 07/21/2018 02/10/2018 09/30/2017 09/30/2017 09/30/2017  Does Patient Have a Medical Advance Directive? Yes Yes No Yes Yes Yes Yes  Type of Advance Directive Healthcare Power of [place] will  Does patient want to make changes to medical advance directive? - - - - No - Patient declined No - Patient declined -  Copy of HSublimityin Chart? Yes - validated most recent copy scanned in chart (See row information) Yes - validated most recent copy scanned in chart (See row information) - No -  copy requested No - copy requested No - copy requested -  Would patient like information on creating a medical advance directive? - - No - Patient declined - - - -    Current Medications (verified) Outpatient Encounter Medications as of 06/28/2021  Medication Sig   alendronate (FOSAMAX) 70 MG tablet Take 1 tablet (70 mg total) by mouth once a week. Take 30 minutes before first meal and do not lie down for 30 minutes after taking.   allopurinol (ZYLOPRIM) 100 MG tablet TAKE 1 TABLET ONCE DAILY.   amiodarone (PACERONE) 200 MG tablet Take 0.5 tablets (100 mg total) by mouth daily.   aspirin EC 81 MG tablet Take 81 mg by mouth daily.   budesonide (PULMICORT) 0.5 MG/2ML nebulizer solution Take 2 mLs (0.5 mg total) by nebulization 2 (two) times daily.   carvedilol (COREG) 3.125 MG tablet TAKE 1 TABLET BY MOUTH TWICE DAILY WITH A MEAL.   cholecalciferol (VITAMIN D) 1000 units tablet Take 1,000 Units by mouth daily.    fluticasone (CUTIVATE) 0.05 % cream Apply 1 application topically 2 (two) times daily.    formoterol (PERFOROMIST) 20 MCG/2ML nebulizer solution Take 2 mLs (20 mcg total) by nebulization 2 (two) times daily.   furosemide (LASIX) 20 MG tablet TAKE 1 TABLET ONCE DAILY.   guaiFENesin (MUCINEX) 600 MG 12 hr tablet Take 600 mg by mouth in the morning, at noon, in the evening, and at bedtime.    levothyroxine (SYNTHROID) 125 MCG tablet TAKE 1 TABLET ONCE DAILY.   magnesium oxide (MAG-OX) 400 (241.3 Mg) MG tablet Take 0.5 tablets (200 mg total) by mouth daily.  midodrine (PROAMATINE) 2.5 MG tablet Take 1 tablet (2.5 mg total) by mouth 2 (two) times daily with a meal.   Multiple Vitamins-Minerals (PRESERVISION AREDS 2 PO) Take 1 capsule by mouth daily.   oxybutynin (DITROPAN) 5 MG tablet TAKE 1 TABLET BY MOUTH TWICE DAILY.   polyethylene glycol (MIRALAX / GLYCOLAX) 17 g packet Take 17 g by mouth daily.   pravastatin (PRAVACHOL) 40 MG tablet TAKE 1 TABLET ONCE DAILY.   revefenacin (YUPELRI) 175  MCG/3ML nebulizer solution Take 3 mLs (175 mcg total) by nebulization daily.   Revefenacin (YUPELRI) 175 MCG/3ML SOLN Inhale 3 mLs into the lungs daily.   sertraline (ZOLOFT) 50 MG tablet TAKE ONE TABLET AT BEDTIME.   thiamine 100 MG tablet Take 1 tablet (100 mg total) by mouth daily.   No facility-administered encounter medications on file as of 06/28/2021.    Allergies (verified) Lisinopril   History: Past Medical History:  Diagnosis Date   AAA (abdominal aortic aneurysm) ([place])    a. 2002 s/p repair.   Allergic rhinitis    Back pain    CAD (coronary artery disease)    a. 02/2002 H/o MI with stenting x 2; b. 04/2013 MV: EF 25%, large posterior lateral infarct w/o ischemia; c. 03/2016 VT Arrest/Cath: LM 60ost, LAD 40p/m ISR, LCX 100p/m, RCA nl-->Med Rx.   Carotid arterial disease ([place])    a. 12/2010 s/p R CEA;  b. 05/2015 Carotid U/S: bilat <40% ICA stenosis.   Chronic combined systolic and diastolic CHF (congestive heart failure) ([place])    a. 03/2016 Echo: Ef 35-40%, grade 1 DD.   Compression fracture    COPD (chronic obstructive pulmonary disease) ([facility])    Dermatitis    Diverticulosis of colon    DJD (degenerative joint disease)    and Gout   Hemorrhoids    History of colonic polyps    History of gout    History of pneumonia    Hypercholesterolemia    Hypertension    Hypertensive heart disease    Ischemic cardiomyopathy    a. EF prev <35%-->improved to normal by Echo 8/14:  Mild LVH, focal basal hypertrophy, EF 60-65%, normal wall motion, mild BAE, PASP 36; c. 03/2016 Echo: EF 35-40%, Gr1 DD, triv AI, mild MR, mod dil LA, mild-mod TR, PASP 81mHg.   Osteoporosis    [place]ipheral vascular disease ([place]    Presence of cardiac defibrillator    a. 03/2008 s/p MDT [id number] Maximo II DR, DC AICD; b. 03/2013: ICD shock for T wave oversensing;  c. 10/2015: collective decision not to replace ICD given improvement in LV fxn; d. VT Arrest-->Gen change to MDT ser # [id number].   Skin cancer     shoulders and forehead   Syncope    T wave over sensing resulting in inappropriate shocks    a. 03/2013.   Tobacco abuse    Ventricular tachycardia ([place] 04/01/2016   Past Surgical History:  Procedure Laterality Date   ABDOMINAL AORTIC ANEURYSM REPAIR  2002   by Dr. LKellie Simmering  aicd placed  03/2008   Dr. KCaryl Comes  cad stent  02/2002   Dr. HPercival Spanish  CARDIAC CATHETERIZATION     X 2 stents   CARDIAC CATHETERIZATION N/A 04/03/2016   Procedure: Left Heart Cath and Coronary Angiography;  Surgeon: HBelva Crome MD;  [facility];  Service: Cardiovascular;  Laterality: N/A;   CARDIAC DEFIBRILLATOR PLACEMENT  2009   CARDIAC DEFIBRILLATOR PLACEMENT     CAROTID ENDARTERECTOMY  Right January 02, 2011   EAR CYST EXCISION Left 12/13/2015   Procedure: Excision left ear lesion ;  Surgeon: Melissa Montane, MD;  Location: [facility];  Service: ENT;  Laterality: Left;   EP IMPLANTABLE DEVICE N/A 04/06/2016   Procedure:  ICD Generator Changeout;  Surgeon: Deboraha Sprang, MD;  Location: [facility];  Service: Cardiovascular;  Laterality: N/A;   EXCISION OF LESION LEFT EAR Left 12/13/2015   EXTERNAL EAR SURGERY Left    Cancer Removal from left ear   I & D EXTREMITY Left 04/23/2016   Procedure: IRRIGATION AND DEBRIDEMENT HAND;  Surgeon: Leandrew Koyanagi, MD;  Location: [facility];  Service: Orthopedics;  Laterality: Left;   IR IMAGE GUIDED DRAINAGE BY PERCUTANEOUS CATHETER  10/08/2017   lumbar back     done after compression fracture   OPEN REDUCTION INTERNAL FIXATION (ORIF) METACARPAL Left 04/04/2016   Procedure: OPEN REDUCTION INTERNAL FIXATION (ORIF) LEFT 2ND, 3RD, 4TH METACARPAL FRACTURE;  Surgeon: Leandrew Koyanagi, MD;  Location: [facility];  Service: Orthopedics;  Laterality: Left;  OPEN REDUCTION INTERNAL FIXATION (ORIF) LEFT 2ND, 3RD, 4TH METACARPAL FRACTURE   OPEN REDUCTION INTERNAL FIXATION (ORIF) METACARPAL Left 04/23/2016   Procedure: REVISION OPEN REDUCTION INTERNAL FIXATION (ORIF) 2ND METACARPAL;  Surgeon:  Leandrew Koyanagi, MD;  Location: [facility];  Service: Orthopedics;  Laterality: Left;   right corotid enderectomy  12/2010   Dr. Scot Dock   SKIN SPLIT GRAFT Left 12/13/2015   Procedure: with possible skin graft;  Surgeon: Melissa Montane, MD;  Location: [facility];  Service: ENT;  Laterality: Left;   SQUAMOUS CELL CARCINOMA EXCISION     on back, forehead, arm   TONSILLECTOMY     Family History  Problem Relation Age of Onset   Hypertension Father    Heart disease Father 4       Heart Disease before age 46   Alcohol abuse Father        causing multiple medical problems; uncertain exact cause of death   Hypertension Mother    Heart disease Mother        Heart Disease before age 51   Cancer Mother        pancreatic   Scoliosis Sister    Thyroid disease Daughter    Anxiety disorder Son    Social History   Socioeconomic History   Marital status: Widowed    Spouse name: Not on file   Number of children: 3   Years of education: Not on file   Highest education level: Not on file  Occupational History   Occupation: laywer    Comment: retired  Tobacco Use   Smoking status: Former    Packs/day: 1.50    Years: 65.00    Pack years: 97.50    Types: Cigarettes, Cigars, Pipe    Quit date: 03/13/2008    Years since quitting: 13.3   Smokeless tobacco: Never   Tobacco comments:    quit about 10 years ago  Vaping Use   Vaping Use: Never used  Substance and Sexual Activity   Alcohol use: Yes    Alcohol/week: 0.0 standard drinks    Comment: 1 beer daily, and 1 margarita weekly    Drug use: Never   Sexual activity: Not on file  Other Topics Concern   Not on file  Social History Narrative   ** Merged History Encounter **       Lives locally.  Had been living with wife who had become quite  ill recently and died on the evening of 2016/04/13.   Social Determinants of Health   Financial Resource Strain: Low Risk    Difficulty of Paying Living Expenses: Not hard at all  Food Insecurity: No Food  Insecurity   Worried About Charity fundraiser in the Last Year: Never true   [place] in the Last Year: Never true  Transportation Needs: No Transportation Needs   Lack of Transportation (Medical): No   Lack of Transportation (Non-Medical): No  Physical Activity: Insufficiently Active   Days of Exercise per Week: 5 days   Minutes of Exercise per Session: 20 min  Stress: No Stress Concern Present   Feeling of Stress : Not at all  Social Connections: Socially Isolated   Frequency of Communication with Friends and Family: More than three times a week   Frequency of Social Gatherings with Friends and Family: Once a week   Attends Religious Services: Never   Marine scientist or Organizations: No   Attends Archivist Meetings: Never   Marital Status: Widowed    Tobacco Counseling Counseling given: Not Answered Tobacco comments: quit about 10 years ago   Clinical Intake:  Pre-visit preparation completed: Yes  Pain : No/denies pain     BMI - recorded: 27.26 Nutritional Status: BMI 25 -29 Overweight Nutritional Risks: None Diabetes: No  How often do you need to have someone help you when you read instructions, pamphlets, or other written materials from your doctor or pharmacy?: 1 - Never  Diabetic?No  Interpreter Needed?: No  Information entered by :: Charlott Rakes, LPN   Activities of Daily Living In your present state of health, do you have any difficulty performing the following activities: 06/28/2021  Hearing? Y  Comment wears hearing aids  Vision? N  Difficulty concentrating or making decisions? N  Walking or climbing stairs? N  Dressing or bathing? N  Doing errands, shopping? N  Preparing Food and eating ? N  Using the Toilet? N  In the past six months, have you accidently leaked urine? N  Do you have problems with loss of bowel control? N  Managing your Medications? N  Managing your Finances? N  Housekeeping or managing your  Housekeeping? N  Some recent data might be hidden    Patient Care Team: Caren Macadam, MD as PCP - General (Family Medicine) Minus Breeding, MD as PCP - Cardiology (Cardiology) Allyn Kenner, MD (Dermatology)  Indicate any recent Medical Services you may have received from other than [facility] providers in the past year (date may be approximate).     Assessment:   This is a routine wellness examination for [name].  Hearing/Vision screen Hearing Screening - Comments:: Pt wears hearing aids  Vision Screening - Comments:: Pt follows up with provider annually  Dietary issues and exercise activities discussed: Current Exercise Habits: Home exercise routine, Type of exercise: stretching;Other - see comments, Time (Minutes): 30, Frequency (Times/Week): 5, Weekly Exercise (Minutes/Week): 150   Goals Addressed             This Visit's Progress    Patient Stated       To get rid of walker       Depression Screen PHQ 2/9 Scores 06/28/2021 06/15/2020 12/10/2016 07/09/2016 05/10/2016 06/08/2013  PHQ - 2 Score 1 0 0 1 5 0  PHQ- 9 Score - - - - 14 -    Fall Risk Fall Risk  06/28/2021 11/29/2020 06/15/2020 09/16/2019 09/10/2018  Falls in  the past year? 0 0 '1 1 1  '$ Comment - - - Emmi Telephone Survey: data to providers prior to load Emmi Telephone Survey: data to providers prior to load  Number falls in past yr: 0 0 '1 1 1  '$ Comment - - - Emmi Telephone Survey Actual Response = 2 Emmi Telephone Survey Actual Response = 5  Injury with Fall? 0 0 '1 1 1  '$ Comment - - hurt left shoulder a month ago but exercises it for releif - -  Risk Factor Category  - - - - -  Risk for fall due to : Impaired vision;Impaired mobility;Impaired balance/gait - History of fall(s);Impaired balance/gait;Impaired mobility;Impaired vision - -  Risk for fall due to: Comment related to use of walker - - - -  Follow up - - Falls prevention discussed - -    FALL RISK PREVENTION PERTAINING TO THE HOME:  Any stairs in or around  the home? Yes  If so, are there any without handrails? No  Home free of loose throw rugs in walkways, pet beds, electrical cords, etc? Yes  Adequate lighting in your home to reduce risk of falls? Yes   ASSISTIVE DEVICES UTILIZED TO PREVENT FALLS:  Life alert? Yes  Use of a cane, walker or w/c? Yes  Grab bars in the bathroom? Yes  Shower chair or bench in shower? Yes  Elevated toilet seat or a handicapped toilet? No   TIMED UP AND GO:  Was the test performed? No .   Cognitive Function:     6CIT Screen 06/28/2021 06/15/2020  What Year? 0 points 0 points  What month? 0 points 0 points  What time? 0 points -  Count back from 20 0 points 0 points  Months in reverse 0 points 0 points  Repeat phrase 10 points 0 points  Total Score 10 -    Immunizations Immunization History  Administered Date(s) Administered   Fluad Quad(high Dose 65+) 07/31/2019, 07/25/2020   H1N1 10/26/2008   Influenza Split 08/23/2011, 07/15/2012   Influenza Whole 07/14/2008, 08/22/2009, 09/04/2010   Influenza, High Dose Seasonal PF 07/09/2016, 09/18/2017, 07/31/2018   Influenza,inj,Quad PF,6+ Mos 07/07/2013, 07/07/2014, 07/23/2015   PFIZER(Purple Top)SARS-COV-2 Vaccination 11/12/2019, 12/03/2019, 07/26/2020   Pneumococcal Conjugate-13 10/03/2015   Pneumococcal Polysaccharide-23 07/12/2009   Tdap 02/10/2018    TDAP status: Up to date  Flu Vaccine status: Due, Education has been provided regarding the importance of this vaccine. Advised may receive this vaccine at local pharmacy or Health Dept. Aware to provide a copy of the vaccination record if obtained from local pharmacy or Health Dept. Verbalized acceptance and understanding.  Pneumococcal vaccine status: Up to date  Covid-19 vaccine status: Completed vaccines  Qualifies for Shingles Vaccine? Yes   Zostavax completed No   Shingrix Completed?: No.    Education has been provided regarding the importance of this vaccine. Patient has been advised to  call insurance company to determine out of pocket expense if they have not yet received this vaccine. Advised may also receive vaccine at local pharmacy or Health Dept. Verbalized acceptance and understanding.  Screening Tests Health Maintenance  Topic Date Due   Zoster Vaccines- Shingrix (1 of 2) Never done   COVID-19 Vaccine (4 - Booster for Pfizer series) 10/26/2020   INFLUENZA VACCINE  05/22/2021   TETANUS/TDAP  02/11/2028   PNA vac Low Risk Adult  Completed   HPV VACCINES  Aged Out    Health Maintenance  Health Maintenance Due  Topic Date Due  Zoster Vaccines- Shingrix (1 of 2) Never done   COVID-19 Vaccine (4 - Booster for Pfizer series) 10/26/2020   INFLUENZA VACCINE  05/22/2021    Colorectal cancer screening: No longer required.   Additional Screening:  Vision Screening: Recommended annual ophthalmology exams for early detection of glaucoma and other disorders of the eye. Is the patient up to date with their annual eye exam?  Yes  Who is the provider or what is the name of the office in which the patient attends annual eye exams? Unsure of providers name  If pt is not established with a provider, would they like to be referred to a provider to establish care? No .   Dental Screening: Recommended annual dental exams for proper oral hygiene  Community Resource Referral / Chronic Care Management: CRR required this visit?  No   CCM required this visit?  yes     Plan:     I have personally reviewed and noted the following in the patient's chart:   Medical and social history Use of alcohol, tobacco or illicit drugs  Current medications and supplements including opioid prescriptions. Patient is not currently taking opioid prescriptions. Functional ability and status Nutritional status Physical activity Advanced directives List of other physicians Hospitalizations, surgeries, and ER visits in previous 12 months Vitals Screenings to include cognitive,  depression, and falls Referrals and appointments  In addition, I have reviewed and discussed with patient certain preventive protocols, quality metrics, and best practice recommendations. A written personalized care plan for preventive services as well as general preventive health recommendations were provided to patient.     Willette Brace, LPN   [id number]   Nurse Notes: None

## 2021-06-28 NOTE — Patient Instructions (Addendum)
[name] , Thank you for taking time to come for your Medicare Wellness Visit. I appreciate your ongoing commitment to your health goals. Please review the following plan we discussed and let me know if I can assist you in the future.   Screening recommendations/referrals: Colonoscopy: No  longer required  Recommended yearly ophthalmology/optometry visit for glaucoma screening and checkup Recommended yearly dental visit for hygiene and checkup  Vaccinations: Influenza vaccine: Due Pneumococcal vaccine: Completed  Tdap vaccine: Done 02/10/18  Shingles vaccine: Shingrix discussed. Please contact your pharmacy for coverage information.    Covid-19: Completed 1/21, 2/11, & 07/26/20  Advanced directives: Copies in chart  Conditions/risks identified: Get rid of walker   Next appointment: Follow up in one year for your annual wellness visit.   Preventive Care 85 Years and Older, Male Preventive care refers to lifestyle choices and visits with your health care provider that can promote health and wellness. What does preventive care include? A yearly physical exam. This is also called an annual well check. Dental exams once or twice a year. Routine eye exams. Ask your health care provider how often you should have your eyes checked. Personal lifestyle choices, including: Daily care of your teeth and gums. Regular physical activity. Eating a healthy diet. Avoiding tobacco and drug use. Limiting alcohol use. Practicing safe sex. Taking low doses of aspirin every day. Taking vitamin and mineral supplements as recommended by your health care provider. What happens during an annual well check? The services and screenings done by your health care provider during your annual well check will depend on your age, overall health, lifestyle risk factors, and family history of disease. Counseling  Your health care provider may ask you questions about your: Alcohol use. Tobacco use. Drug  use. Emotional well-being. Home and relationship well-being. Sexual activity. Eating habits. History of falls. Memory and ability to understand (cognition). Work and work Statistician. Screening  You may have the following tests or measurements: Height, weight, and BMI. Blood pressure. Lipid and cholesterol levels. These may be checked every 5 years, or more frequently if you are over 75 years old. Skin check. Lung cancer screening. You may have this screening every year starting at age 85 if you have a 30-pack-year history of smoking and currently smoke or have quit within the past 15 years. Fecal occult blood test (FOBT) of the stool. You may have this test every year starting at age 85. Flexible sigmoidoscopy or colonoscopy. You may have a sigmoidoscopy every 5 years or a colonoscopy every 10 years starting at age 85. Prostate cancer screening. Recommendations will vary depending on your family history and other risks. Hepatitis C blood test. Hepatitis B blood test. Sexually transmitted disease (STD) testing. Diabetes screening. This is done by checking your blood sugar (glucose) after you have not eaten for a while (fasting). You may have this done every 1-3 years. Abdominal aortic aneurysm (AAA) screening. You may need this if you are a current or former smoker. Osteoporosis. You may be screened starting at age 70 if you are at high risk. Talk with your health care provider about your test results, treatment options, and if necessary, the need for more tests. Vaccines  Your health care provider may recommend certain vaccines, such as: Influenza vaccine. This is recommended every year. Tetanus, diphtheria, and acellular pertussis (Tdap, Td) vaccine. You may need a Td booster every 10 years. Zoster vaccine. You may need this after age 59. Pneumococcal 13-valent conjugate (PCV13) vaccine. One dose is recommended  after age 85. Pneumococcal polysaccharide (PPSV23) vaccine. One dose is  recommended after age 85. Talk to your health care provider about which screenings and vaccines you need and how often you need them. This information is not intended to replace advice given to you by your health care provider. Make sure you discuss any questions you have with your health care provider. Document Released: 11/04/2015 Document Revised: 06/27/2016 Document Reviewed: 08/09/2015 Elsevier Interactive Patient Education  2017 [employer] Prevention in the Home Falls can cause injuries. They can happen to people of all ages. There are many things you can do to make your home safe and to help prevent falls. What can I do on the outside of my home? Regularly fix the edges of walkways and driveways and fix any cracks. Remove anything that might make you trip as you walk through a door, such as a raised step or threshold. Trim any bushes or trees on the path to your home. Use bright outdoor lighting. Clear any walking paths of anything that might make someone trip, such as rocks or tools. Regularly check to see if handrails are loose or broken. Make sure that both sides of any steps have handrails. Any raised decks and porches should have guardrails on the edges. Have any leaves, snow, or ice cleared regularly. Use sand or salt on walking paths during winter. Clean up any spills in your garage right away. This includes oil or grease spills. What can I do in the bathroom? Use night lights. Install grab bars by the toilet and in the tub and shower. Do not use towel bars as grab bars. Use non-skid mats or decals in the tub or shower. If you need to sit down in the shower, use a plastic, non-slip stool. Keep the floor dry. Clean up any water that spills on the floor as soon as it happens. Remove soap buildup in the tub or shower regularly. Attach bath mats securely with double-sided non-slip rug tape. Do not have throw rugs and other things on the floor that can make you  trip. What can I do in the bedroom? Use night lights. Make sure that you have a light by your bed that is easy to reach. Do not use any sheets or blankets that are too big for your bed. They should not hang down onto the floor. Have a firm chair that has side arms. You can use this for support while you get dressed. Do not have throw rugs and other things on the floor that can make you trip. What can I do in the kitchen? Clean up any spills right away. Avoid walking on wet floors. Keep items that you use a lot in easy-to-reach places. If you need to reach something above you, use a strong step stool that has a grab bar. Keep electrical cords out of the way. Do not use floor polish or wax that makes floors slippery. If you must use wax, use non-skid floor wax. Do not have throw rugs and other things on the floor that can make you trip. What can I do with my stairs? Do not leave any items on the stairs. Make sure that there are handrails on both sides of the stairs and use them. Fix handrails that are broken or loose. Make sure that handrails are as long as the stairways. Check any carpeting to make sure that it is firmly attached to the stairs. Fix any carpet that is loose or worn. Avoid having throw  rugs at the top or bottom of the stairs. If you do have throw rugs, attach them to the floor with carpet tape. Make sure that you have a light switch at the top of the stairs and the bottom of the stairs. If you do not have them, ask someone to add them for you. What else can I do to help prevent falls? Wear shoes that: Do not have high heels. Have rubber bottoms. Are comfortable and fit you well. Are closed at the toe. Do not wear sandals. If you use a stepladder: Make sure that it is fully opened. Do not climb a closed stepladder. Make sure that both sides of the stepladder are locked into place. Ask someone to hold it for you, if possible. Clearly mark and make sure that you can  see: Any grab bars or handrails. First and last steps. Where the edge of each step is. Use tools that help you move around (mobility aids) if they are needed. These include: Canes. Walkers. Scooters. Crutches. Turn on the lights when you go into a dark area. Replace any light bulbs as soon as they burn out. Set up your furniture so you have a clear path. Avoid moving your furniture around. If any of your floors are uneven, fix them. If there are any pets around you, be aware of where they are. Review your medicines with your doctor. Some medicines can make you feel dizzy. This can increase your chance of falling. Ask your doctor what other things that you can do to help prevent falls. This information is not intended to replace advice given to you by your health care provider. Make sure you discuss any questions you have with your health care provider. Document Released: 08/04/2009 Document Revised: 03/15/2016 Document Reviewed: 11/12/2014 Elsevier Interactive Patient Education  2017 [employer].

## 2021-06-29 ENCOUNTER — Telehealth: Payer: Self-pay | Admitting: Family Medicine

## 2021-06-29 ENCOUNTER — Encounter: Payer: Self-pay | Admitting: Rheumatology

## 2021-06-29 NOTE — Progress Notes (Signed)
  Chronic Care Management   Note  06/29/2021 Name: Maciel Wyllie MRN: [id number] DOB: Jan 26, 1931  Lynford Shytle is a 87 y.o. year old male who is a primary care patient of Koberlein, Steele Berg, MD. I reached out to Casimer Leek by phone today in response to a referral sent by Mr. Yoniel Hinzman Leugers's PCP, Caren Macadam, MD.   Mr. Melody was given information about Chronic Care Management services today including:  CCM service includes personalized support from designated clinical staff supervised by his physician, including individualized plan of care and coordination with other care providers 24/7 contact phone numbers for assistance for urgent and routine care needs. Service will only be billed when office clinical staff spend 20 minutes or more in a month to coordinate care. Only one practitioner may furnish and bill the service in a calendar month. The patient may stop CCM services at any time (effective at the end of the month) by phone call to the office staff.   Patient agreed to services and verbal consent obtained.   Follow up plan:   Tatjana Secretary/administrator
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
## 2021-07-04 ENCOUNTER — Ambulatory Visit (INDEPENDENT_AMBULATORY_CARE_PROVIDER_SITE_OTHER): Payer: Medicare Other | Admitting: Cardiology

## 2021-07-04 ENCOUNTER — Encounter: Payer: Self-pay | Admitting: Cardiology

## 2021-07-04 ENCOUNTER — Other Ambulatory Visit: Payer: Self-pay

## 2021-07-04 VITALS — BP 126/68 | HR 61 | Ht 68.0 in | Wt 175.4 lb

## 2021-07-04 DIAGNOSIS — I2510 Atherosclerotic heart disease of native coronary artery without angina pectoris: Secondary | ICD-10-CM

## 2021-07-04 DIAGNOSIS — Z9861 Coronary angioplasty status: Secondary | ICD-10-CM | POA: Diagnosis not present

## 2021-07-04 DIAGNOSIS — I255 Ischemic cardiomyopathy: Secondary | ICD-10-CM

## 2021-07-04 DIAGNOSIS — I739 Peripheral vascular disease, unspecified: Secondary | ICD-10-CM | POA: Diagnosis not present

## 2021-07-04 NOTE — Patient Instructions (Signed)
Medication Instructions:  Your physician recommends that you continue on your current medications as directed. Please refer to the Current Medication list given to you today.   *If you need a refill on your cardiac medications before your next appointment, please call your pharmacy*  Lab Work: NONE  Testing/Procedures: NONE  Follow-Up: At [facility], you and your health needs are our priority.  As part of our continuing mission to provide you with exceptional heart care, we have created designated Provider Care Teams.  These Care Teams include your primary Cardiologist (physician) and Advanced Practice Providers (APPs -  Physician Assistants and Nurse Practitioners) who all work together to provide you with the care you need, when you need it.  We recommend signing up for the patient portal called "MyChart".  Sign up information is provided on this After Visit Summary.  MyChart is used to connect with patients for Virtual Visits (Telemedicine).  Patients are able to view lab/test results, encounter notes, upcoming appointments, etc.  Non-urgent messages can be sent to your provider as well.   To learn more about what you can do with MyChart, go to [email or web address].    Your next appointment:   9 month(s)  The format for your next appointment:   In Person  Provider:   You may see Minus Breeding, MD  or one of the following Advanced Practice Providers on your designated Care Team:   Rosaria Ferries, PA-C Caron Presume, PA-C Jory Sims, DNP, ANP

## 2021-07-07 ENCOUNTER — Telehealth: Payer: Self-pay | Admitting: Pharmacist

## 2021-07-07 NOTE — Chronic Care Management (AMB) (Signed)
Chronic Care Management Pharmacy Assistant   Name: Haze Ferrufino  MRN: [id number] DOB: 08-27-1931  [name] is an 87 y.o. year old male who presents for his initial CCM visit with the clinical pharmacist.  Reason for Encounter: Chart prep for initial visit with Jeni Salles the Clinical Pharmacist on 07/11/2021   Conditions to be addressed/monitored: HTN, HLD, COPD, and CKD Stage 3, Gout, Osteoarthritis    Recent office visits:  None  Recent consult visits:  07/04/2021 Minus Breeding, MD (cardiology) - Patient was seen for Cardiomyopathy and 2 other issues. No medication changes. Follow up in 9 months.  06/05/2021 Lynn Ito, MD (ENT) - Patient was seen for bilateral impacted cerumen. Follow up as needed.  03/22/2021 Bo Merino, MD (Rheumatology) - Patient was seen for  Age related osteoporosis and 38 other issues. No medication changes. Follow up in 1 year.  01/30/2021 Eric Form NP (Pulmonary disease) - COPD and other issues. Discontinued Doxycycline '100mg'$ . Follow up in 6 months or if symptoms fail to improve.  01/24/2021 [name] MD (Cardiology) - Patient was seen for ventricular tachycardia and other issues. Changed Amiodarone from '200mg'$  daily 5 days per week to '100mg'$  daily and Midodrine 2.'5mg'$   three times daily to twice daily. Discontinued Prednisone. Follow up in 6 months.  Hospital visits:  None in previous 6 months  Medications: Outpatient Encounter Medications as of 07/07/2021  Medication Sig   alendronate (FOSAMAX) 70 MG tablet Take 1 tablet (70 mg total) by mouth once a week. Take 30 minutes before first meal and do not lie down for 30 minutes after taking.   allopurinol (ZYLOPRIM) 100 MG tablet TAKE 1 TABLET ONCE DAILY.   amiodarone (PACERONE) 200 MG tablet Take 0.5 tablets (100 mg total) by mouth daily.   aspirin EC 81 MG tablet Take 81 mg by mouth daily.   budesonide (PULMICORT) 0.5 MG/2ML nebulizer solution Take 2 mLs  (0.5 mg total) by nebulization 2 (two) times daily.   carvedilol (COREG) 3.125 MG tablet TAKE 1 TABLET BY MOUTH TWICE DAILY WITH A MEAL.   cholecalciferol (VITAMIN D) 1000 units tablet Take 1,000 Units by mouth daily.    fluticasone (CUTIVATE) 0.05 % cream Apply 1 application topically 2 (two) times daily.    formoterol (PERFOROMIST) 20 MCG/2ML nebulizer solution Take 2 mLs (20 mcg total) by nebulization 2 (two) times daily.   furosemide (LASIX) 20 MG tablet TAKE 1 TABLET ONCE DAILY.   guaiFENesin (MUCINEX) 600 MG 12 hr tablet Take 600 mg by mouth in the morning, at noon, in the evening, and at bedtime.    levothyroxine (SYNTHROID) 125 MCG tablet TAKE 1 TABLET ONCE DAILY.   magnesium oxide (MAG-OX) 400 (241.3 Mg) MG tablet Take 0.5 tablets (200 mg total) by mouth daily.   midodrine (PROAMATINE) 2.5 MG tablet Take 1 tablet (2.5 mg total) by mouth 2 (two) times daily with a meal.   Multiple Vitamins-Minerals (PRESERVISION AREDS 2 PO) Take 1 capsule by mouth daily.   oxybutynin (DITROPAN) 5 MG tablet TAKE 1 TABLET BY MOUTH TWICE DAILY.   polyethylene glycol (MIRALAX / GLYCOLAX) 17 g packet Take 17 g by mouth daily.   pravastatin (PRAVACHOL) 40 MG tablet TAKE 1 TABLET ONCE DAILY.   revefenacin (YUPELRI) 175 MCG/3ML nebulizer solution Take 3 mLs (175 mcg total) by nebulization daily.   Revefenacin (YUPELRI) 175 MCG/3ML SOLN Inhale 3 mLs into the lungs daily.   sertraline (ZOLOFT) 50 MG tablet TAKE ONE TABLET AT BEDTIME.  thiamine 100 MG tablet Take 1 tablet (100 mg total) by mouth daily.   No facility-administered encounter medications on file as of 07/07/2021.   Fill History:  alendronate 70 mg tablet 05/20/2021 84   allopurinol 100 mg tablet 06/11/2021 30   amiodarone 200 mg tablet 05/28/2021   carvedilol 3.125 mg tablet 06/30/2021 90   furosemide 20 mg tablet 05/20/2021 90   levothyroxine 125 mcg tablet 04/30/2021 90   midodrine 2.5 mg tablet 03/19/2021 90   oxybutynin chloride 5 mg  tablet 06/18/2021 30   pravastatin 40 mg tablet 06/27/2021 90   sertraline 50 mg tablet 06/27/2021 90    Have you seen any other providers since your last visit? No   Any changes in your medications or health? Patient is unsure of any other medication changes unable to check with his daughter that manages his medicines, she is currently in [place].  Any side effects from any medications? No  Do you have an symptoms or problems not managed by your medications? No  Any concerns about your health right now? No  Has your provider asked that you check blood pressure, blood sugar, or follow special diet at home? Yes, Patient checks his blood pressures twice daily with instructions to hold Carvedilol that day if his top number is 100 or below, he states that is a rare occurrence.  Patient states he is to follow a low sodium diet, however he occasionally cheats. He does like his icecream every day.  For breakfast he has cereal with milk and 2 chocolate chip cookies. Lunch is generally soup and a sandwich. Dinner varies, he eats a lot of chicken, some fish and no steak, veggies, fruits and cheese.  Patient mentions that he likes [place] food.   Do you get any type of exercise on a regular basis? No, patient states he walks around his house but not outside without assistance.  He uses a cane and a walker.  Can you think of a goal you would like to reach for your health? No, he states he would like to feel young again.  Do you have any problems getting your medications? No  Is there anything that you would like to discuss during the appointment? No  Please bring medications and supplements to appointment   Note: Unable to review medication list, his daughter that manages his medications is currently in [place].  Care Gaps:  AWV - scheduled for 07/11/2021 at 8:45 Zoster vaccines - never done Covid 19 vaccine booster 4 - overdue 09/28/2021 Flu vaccine - due  Star Rating Drugs:   Pravastatin  '40mg'$  - last filled 06/27/2021 90DS at [facility] [phone]

## 2021-07-11 ENCOUNTER — Other Ambulatory Visit (INDEPENDENT_AMBULATORY_CARE_PROVIDER_SITE_OTHER): Payer: Medicare Other

## 2021-07-11 ENCOUNTER — Other Ambulatory Visit: Payer: Self-pay

## 2021-07-11 ENCOUNTER — Ambulatory Visit (INDEPENDENT_AMBULATORY_CARE_PROVIDER_SITE_OTHER): Payer: Medicare Other | Admitting: Pharmacist

## 2021-07-11 DIAGNOSIS — R944 Abnormal results of kidney function studies: Secondary | ICD-10-CM

## 2021-07-11 DIAGNOSIS — I10 Essential (primary) hypertension: Secondary | ICD-10-CM

## 2021-07-11 DIAGNOSIS — E039 Hypothyroidism, unspecified: Secondary | ICD-10-CM

## 2021-07-11 LAB — BASIC METABOLIC PANEL
BUN: 18 mg/dL (ref 6–23)
CO2: 30 mEq/L (ref 19–32)
Calcium: 9.4 mg/dL (ref 8.4–10.5)
Chloride: 100 mEq/L (ref 96–112)
Creatinine, Ser: 1.18 mg/dL (ref 0.40–1.50)
GFR: 55.15 mL/min — ABNORMAL LOW (ref >60.00)
Glucose, Bld: 71 mg/dL (ref 70–99)
Potassium: 4.1 mEq/L (ref 3.5–5.1)
Sodium: 138 mEq/L (ref 135–145)

## 2021-07-11 NOTE — Progress Notes (Signed)
Chronic Care Management Pharmacy Note  07/21/2021 Name:  [name] MRN:  [id number] DOB:  10-12-1931  Summary: BP is at goal < 140/90 per home and office readings Pt is unsure of how he is taking medications  Recommendations/Changes made from today's visit: -Recommended taking 1/2 tablet of amiodarone daily as prescribed -Recommended taking 2000 units of vitamin D daily  Plan: BP assessment in 1-2 months   Subjective: [name] is an 87 y.o. year old male who is a primary patient of Koberlein, Steele Berg, MD.  The CCM team was consulted for assistance with disease management and care coordination needs.    Engaged with patient face to face for initial visit in response to provider referral for pharmacy case management and/or care coordination services.   Consent to Services:  The patient was given the following information about Chronic Care Management services today, agreed to services, and gave verbal consent: 1. CCM service includes personalized support from designated clinical staff supervised by the primary care provider, including individualized plan of care and coordination with other care providers 2. 24/7 contact phone numbers for assistance for urgent and routine care needs. 3. Service will only be billed when office clinical staff spend 20 minutes or more in a month to coordinate care. 4. Only one practitioner may furnish and bill the service in a calendar month. 5.The patient may stop CCM services at any time (effective at the end of the month) by phone call to the office staff. 6. The patient will be responsible for cost sharing (co-pay) of up to 20% of the service fee (after annual deductible is met). Patient agreed to services and consent obtained.  Patient Care Team: Caren Macadam, MD as PCP - General (Family Medicine) Minus Breeding, MD as PCP - Cardiology (Cardiology) Allyn Kenner, MD (Dermatology) Viona Gilmore, [facility] as Pharmacist  (Pharmacist)  Recent office visits: 06/28/21 Charlott Rakes, LPN: Patient presented for AWV.  11/16/20 Micheline Rough, MD: Patient presented for chronic conditions follow up.   Recent consult visits: 07/04/2021 Minus Breeding, MD (cardiology) - Patient was seen for Cardiomyopathy and 2 other issues. No medication changes. Follow up in 9 months.   06/05/2021 Lynn Ito, MD (ENT) - Patient was seen for bilateral impacted cerumen. Follow up as needed.   03/22/2021 Bo Merino, MD (Rheumatology) - Patient was seen for  Age related osteoporosis and 61 other issues. No medication changes. Follow up in 1 year.   01/30/2021 Eric Form NP (Pulmonary disease) - COPD and other issues. Discontinued Doxycycline 143m. Follow up in 6 months or if symptoms fail to improve.  01/24/2021 SVirl AxeMD (Cardiology) - Patient was seen for ventricular tachycardia and other issues. Changed Amiodarone from 2049mdaily 5 days per week to 10054maily and Midodrine 2.5mg6mhree times daily to twice daily. Discontinued Prednisone. Follow up in 6 months.  Hospital visits: None in previous 6 months   Objective:  Lab Results  Component Value Date   CREATININE 1.18 07/11/2021   BUN 18 07/11/2021   GFR 55.15 (L) 07/11/2021   GFRNONAA 55 (L) 11/28/2020   GFRAA 61 09/20/2020   NA 138 07/11/2021   K 4.1 07/11/2021   CALCIUM 9.4 07/11/2021   CO2 30 07/11/2021   GLUCOSE 71 07/11/2021    Lab Results  Component Value Date/Time   HGBA1C 5.1 04/02/2016 05:04 AM   GFR 55.15 (L) 07/11/2021 01:55 PM   GFR 54.85 (L) 11/16/2020 02:40 PM    Last  diabetic Eye exam: No results found for: HMDIABEYEEXA  Last diabetic Foot exam: No results found for: HMDIABFOOTEX   Lab Results  Component Value Date   CHOL 180 11/16/2020   HDL 36.10 (L) 11/16/2020   LDLCALC 57 05/11/2015   LDLDIRECT 97.0 11/16/2020   TRIG 315.0 (H) 11/16/2020   CHOLHDL 5 11/16/2020    Hepatic Function Latest Ref Rng & Units  06/20/2021 11/28/2020 11/16/2020  Total Protein 6.1 - 8.1 g/dL 7.7 7.4 7.8  Albumin 3.5 - 5.0 g/dL - 3.2(L) 4.0  AST 10 - 35 U/L 21 33 36  ALT 9 - 46 U/L _0 Alk Phosphatase 38 - 126 U/L - 80 93  Total Bilirubin 0.2 - 1.2 mg/dL 0.6 0.6 0.6  Bilirubin, Direct 0.0 - 0.3 mg/dL - - -    Lab Results  Component Value Date/Time   TSH 4.32 11/16/2020 02:40 PM   TSH 1.89 05/11/2020 01:27 PM   FREET4 0.99 11/12/2018 10:59 AM    CBC Latest Ref Rng & Units 06/20/2021 11/28/2020 11/16/2020  WBC 3.8 - 10.8 Thousand/uL 9.9 7.8 8.0  Hemoglobin 13.2 - 17.1 g/dL 15.6 14.0 15.3  Hematocrit 38.5 - 50.0 % 46.0 40.7 44.1  Platelets 140 - 400 Thousand/uL 126(L) 152 130.0(L)    Lab Results  Component Value Date/Time   VD25OH 31 06/20/2021 02:42 PM   VD25OH 40.90 11/16/2020 02:40 PM   VD25OH 39.81 11/27/2019 02:06 PM    Clinical ASCVD: Yes  The ASCVD Risk score (Arnett DK, et al., 2019) failed to calculate for the following reasons:   The 2019 ASCVD risk score is only valid for ages 81 to 41    Depression screen PHQ 2/9 06/28/2021 06/15/2020  Decreased Interest 0 0  Down, Depressed, Hopeless 1 0  PHQ - 2 Score 1 0  Some recent data might be hidden       Social History   Tobacco Use  Smoking Status Former   Packs/day: 1.50   Years: 65.00   Pack years: 97.50   Types: Cigarettes, Cigars, Pipe   Quit date: 03/13/2008   Years since quitting: 13.3  Smokeless Tobacco Never  Tobacco Comments   quit about 10 years ago   BP Readings from Last 3 Encounters:  07/04/21 126/68  03/22/21 (!) 92/54  01/30/21 122/74   Pulse Readings from Last 3 Encounters:  07/04/21 61  03/22/21 73  01/30/21 74   Wt Readings from Last 3 Encounters:  07/04/21 175 lb 6.4 oz (79.6 kg)  03/22/21 180 lb 9.6 oz (81.9 kg)  01/30/21 178 lb 6.4 oz (80.9 kg)   BMI Readings from Last 3 Encounters:  07/04/21 26.67 kg/m  03/22/21 27.26 kg/m  01/30/21 24.88 kg/m    Assessment/Interventions: Review of patient past  medical history, allergies, medications, health status, including review of consultants reports, laboratory and other test data, was performed as part of comprehensive evaluation and provision of chronic care management services.   SDOH:  (Social Determinants of Health) assessments and interventions performed: Yes SDOH Interventions    Flowsheet Row Most Recent Value  SDOH Interventions   Financial Strain Interventions Intervention Not Indicated  Transportation Interventions Intervention Not Indicated      SDOH Screenings   Alcohol Screen: Not on file  Depression (PHQ2-9): Low Risk    PHQ-2 Score: 1  Financial Resource Strain: Low Risk    Difficulty of Paying Living Expenses: Not hard at all  Food Insecurity: No Food Insecurity   Worried About Running Out  of Food in the Last Year: Never true   Ran Out of Food in the Last Year: Never true  Housing: Low Risk    Last Housing Risk Score: 0  Physical Activity: Insufficiently Active   Days of Exercise per Week: 5 days   Minutes of Exercise per Session: 20 min  Social Connections: Socially Isolated   Frequency of Communication with Friends and Family: More than three times a week   Frequency of Social Gatherings with Friends and Family: Once a week   Attends Religious Services: Never   Marine scientist or Organizations: No   Attends Archivist Meetings: Never   Marital Status: Widowed  Stress: No Stress Concern Present   Feeling of Stress : Not at all  Tobacco Use: Medium Risk   Smoking Tobacco Use: Former   Smokeless Tobacco Use: Never  Transportation Needs: No Data processing manager (Medical): No   Lack of Transportation (Non-Medical): No   Patient usually gets up around 6:30am, eats breakfast, goes to the bathroom and then works out for about 20 minutes. His routine includes exercises for his arms and legs. He also enjoys listening to music and spends a lot of the day watching TV as  well.  Patient doesn't walk very well and uses a walker or cane when walking. He also doesn't know a lot about his medications or the quantities. His daughter usually sets aside his pill box and then he crushes all of his medications within eat slot.   He has a caregiver that comes in around 12:30 every day and then leaves around 6:30-7pm. She takes his blood pressure and then gives him his medications depending on what his reading is for the carvedilol. She also fixes his lunch or takes him out for lunch. Patient eats a lot of leftovers and his appetite isn't as much as it was. Patient still eats 3 meals a day.   CCM Care Plan  Allergies  Allergen Reactions   Lisinopril     BP dropped too low per daughter    Medications Reviewed Today     Reviewed by Minus Breeding, MD (Physician) on 07/04/21 at 1544  Med List Status: <None>   Medication Order Taking? Sig Documenting Provider Last Dose Status Informant  alendronate (FOSAMAX) 70 MG tablet [id number] No Take 1 tablet (70 mg total) by mouth once a week. Take 30 minutes before first meal and do not lie down for 30 minutes after taking. Ofilia Neas, PA-C Taking Active   allopurinol (ZYLOPRIM) 100 MG tablet [id number] No TAKE 1 TABLET ONCE DAILY. Minus Breeding, MD Taking Active   amiodarone (PACERONE) 200 MG tablet [id number] No Take 0.5 tablets (100 mg total) by mouth daily. Deboraha Sprang, MD Taking Active   aspirin EC 81 MG tablet [id number] No Take 81 mg by mouth daily. [provider] Taking Active   budesonide (PULMICORT) 0.5 MG/2ML nebulizer solution [id number] No Take 2 mLs (0.5 mg total) by nebulization 2 (two) times daily. Garner Nash, DO Taking Active   carvedilol (COREG) 3.125 MG tablet [id number] No TAKE 1 TABLET BY MOUTH TWICE DAILY WITH A MEAL. Minus Breeding, MD Taking Active   cholecalciferol (VITAMIN D) 1000 units tablet [id number] No Take 1,000 Units by mouth daily.  [provider] Taking Active Child   fluticasone (CUTIVATE) 0.05 % cream [id number] No Apply 1 application topically 2 (two) times daily.  [provider] Taking Active Child  formoterol (PERFOROMIST) 20  MCG/2ML nebulizer solution [id number] No Take 2 mLs (20 mcg total) by nebulization 2 (two) times daily. Garner Nash, DO Taking Active   furosemide (LASIX) 20 MG tablet [id number] No TAKE 1 TABLET ONCE DAILY. Deboraha Sprang, MD Taking Active   guaiFENesin (MUCINEX) 600 MG 12 hr tablet [id number] No Take 600 mg by mouth in the morning, at noon, in the evening, and at bedtime.  [provider] Taking Active Child  levothyroxine (SYNTHROID) 125 MCG tablet [id number] No TAKE 1 TABLET ONCE DAILY. Caren Macadam, MD Taking Active   magnesium oxide (MAG-OX) 400 (241.3 Mg) MG tablet [id number] No Take 0.5 tablets (200 mg total) by mouth daily. Bary Leriche, PA-C Taking Active Child  midodrine (PROAMATINE) 2.5 MG tablet [id number] No Take 1 tablet (2.5 mg total) by mouth 2 (two) times daily with a meal. Deboraha Sprang, MD Taking Active   Multiple Vitamins-Minerals (PRESERVISION AREDS 2 PO) [id number] No Take 1 capsule by mouth daily. [provider] Taking Active Child  oxybutynin (DITROPAN) 5 MG tablet [id number] No TAKE 1 TABLET BY MOUTH TWICE DAILY. Caren Macadam, MD Taking Active   polyethylene glycol (MIRALAX / GLYCOLAX) 17 g packet [id number] No Take 17 g by mouth daily. [provider] Taking Active   pravastatin (PRAVACHOL) 40 MG tablet [id number] No TAKE 1 TABLET ONCE DAILY. Caren Macadam, MD Taking Active   revefenacin (YUPELRI) 175 MCG/3ML nebulizer solution [id number] No Take 3 mLs (175 mcg total) by nebulization daily. Garner Nash, DO Taking Active   Revefenacin (YUPELRI) 175 MCG/3ML SOLN [id number] No Inhale 3 mLs into the lungs daily. Garner Nash, DO Taking Active   sertraline (ZOLOFT) 50 MG tablet [id number] No TAKE ONE TABLET AT BEDTIME. Caren Macadam, MD Taking  Active   thiamine 100 MG tablet [id number] No Take 1 tablet (100 mg total) by mouth daily. Bary Leriche, [place] Taking Active Child            Patient Active Problem List   Diagnosis Date Noted   Coronary artery disease involving native coronary artery of native heart without angina pectoris 09/30/2019   Dyslipidemia 09/30/2019   Educated about COVID-19 virus infection 03/20/2019   VT (ventricular tachycardia) ([place]) 03/20/2019   Chronic hypoxemic respiratory failure ([place]) 08/26/2018   Gait abnormality 08/26/2018   Lobar pneumonia, unspecified organism ([place]) 08/04/2018   Lower GI bleeding 07/21/2018   Forehead laceration, initial encounter 07/21/2018   CKD (chronic kidney disease) stage 3, GFR 30-59 ml/min (HCC) 07/21/2018   History of pneumonia 07/15/2018   Physical deconditioning 11/13/2017   Weakness 11/12/2017   Falls 11/12/2017   Pleural effusion    CHF exacerbation ([place]) 09/30/2017   Shingles outbreak 05/27/2017   Seborrheic dermatitis 05/27/2017   Acute on chronic respiratory failure with hypoxia ([place]) 01/13/2017   COPD with acute bronchitis ([place]) 01/13/2017   CAD S/P percutaneous coronary angioplasty    Chronic combined systolic and diastolic CHF (congestive heart failure) ([place])    Blunt chest trauma 05/04/2016   4.1 cm Aneurysm of thoracic aorta ([place]) 04/27/2016   SIADH (syndrome of inappropriate ADH production) ([place])    Orthostatic hypotension 04/12/2016   TBI (traumatic brain injury) ([place])    Acute on chronic combined systolic and diastolic congestive heart failure (HCC)    S/P ORIF (open reduction internal fixation) fracture    Thrombocytopenia (HCC)    Urinary retention    Slow transit constipation    Thyroid activity decreased  Traumatic subdural hematoma with loss of consciousness ([place])    Pulmonary hypertension ([place])    Cardiomyopathy, ischemic    Subdural hematoma (HCC)    Cardiac arrest ([place]) 03/29/2016   Basal cell carcinoma of auricle of ear  12/13/2015   T wave over sensing resulting in inappropriate shocks 08/14/2013   Vitamin D deficiency disease 04/09/2012   Actinic skin damage 05/22/2011   Alcohol use 03/22/2011   Other dysphagia 02/16/2011   Carotid arterial disease ([place]) 11/17/2009   GOUT 09/23/2009   Age-related osteoporosis with current pathological fracture 09/23/2009   SYNCOPE 04/19/2009   Automatic implantable cardioverter-defibrillator in situ 04/13/2008   COLONIC POLYPS 12/09/2007   Diverticulosis of colon 12/09/2007   Essential hypertension 12/08/2007   Peripheral vascular disease ([place]) 12/08/2007   COPD GOLD III 12/08/2007   Osteoarthritis 12/08/2007    Immunization History  Administered Date(s) Administered   Fluad Quad(high Dose 65+) 07/31/2019, 07/25/2020   H1N1 10/26/2008   Influenza Split 08/23/2011, 07/15/2012   Influenza Whole 07/14/2008, 08/22/2009, 09/04/2010   Influenza, High Dose Seasonal PF 07/09/2016, 09/18/2017, 07/31/2018   Influenza,inj,Quad PF,6+ Mos 07/07/2013, 07/07/2014, 07/23/2015   PFIZER(Purple Top)SARS-COV-2 Vaccination 11/12/2019, 12/03/2019, 07/26/2020   Pneumococcal Conjugate-13 10/03/2015   Pneumococcal Polysaccharide-23 07/12/2009   Tdap 02/10/2018    Conditions to be addressed/monitored:  Hypertension, Hyperlipidemia, Heart Failure, Coronary Artery Disease, COPD, and Osteoporosis  Care Plan : [place]  Updates made by Viona Gilmore, [place] since 07/21/2021 12:00 AM     Problem: Problem: Hypertension, Hyperlipidemia, Heart Failure, Coronary Artery Disease, COPD, and Osteoporosis      Long-Range Goal: Patient-Specific Goal   Start Date: 07/12/2021  Expected End Date: 07/12/2021  This Visit's Progress: On track  Priority: High  Note:   Current Barriers:  Unable to self administer medications as prescribed  Pharmacist Clinical Goal(s):  Patient will achieve adherence to monitoring guidelines and medication adherence to achieve therapeutic  efficacy achieve ability to self administer medications as prescribed through use of pillbox as evidenced by patient report through collaboration with PharmD and provider.   Interventions: 1:1 collaboration with Caren Macadam, MD regarding development and update of comprehensive plan of care as evidenced by provider attestation and co-signature Inter-disciplinary care team collaboration (see longitudinal plan of care) Comprehensive medication review performed; medication list updated in electronic medical record  Hypertension (BP goal <140/90) -Controlled -Current treatment: Carvedilol 3.125 mg 1 tablet twice daily -Medications previously tried: n/a  -Current home readings: 132/80, 120/70 (checking daily) -Current dietary habits: eats a lot of bland food; consciously tries not to use salt -Current exercise habits: legs and arm exercises  -Denies hypotensive/hypertensive symptoms -Educated on BP goals and benefits of medications for prevention of heart attack, stroke and kidney damage; Importance of home blood pressure monitoring; Proper BP monitoring technique; Symptoms of hypotension and importance of maintaining adequate hydration; -Counseled to monitor BP at home daily, document, and provide log at future appointments -Counseled on diet and exercise extensively Recommended to continue current medication  Orthostasis (Goal: minimize symptoms) -Controlled -Current treatment  Midodrine 2.5 mg 1 tablet twice daily with a meal -Medications previously tried: none  -Recommended to continue current medication  VT (Goal: regulate heart rhythm ) -Not ideally controlled -Current treatment  Amiodarone 200 mg 1/2 tablet daily - patient is not taking this way -Medications previously tried: none  -Counseled on directions for taking 1/2 tablet every day.  CAD (Goal: prevent heart events) -Controlled -Current treatment  Pravastatin 40 mg 1 tablet daily  Aspirin 81 mg 1 tablet  daily -Medications previously tried: none  -Recommended to continue current medication   Hyperlipidemia: (LDL goal < 70) -Controlled -Current treatment: Pravastatin 40 mg 1 tablet daily -Medications previously tried: none  -Current dietary patterns: limiting fried foods -Current exercise habits: some in the morning -Educated on Cholesterol goals;  Benefits of statin for ASCVD risk reduction; Importance of limiting foods high in cholesterol; -Counseled on diet and exercise extensively Recommended to continue current medication  Heart Failure (Goal: manage symptoms and prevent exacerbations) -Controlled -Last ejection fraction: 30-35% (Date: 08/09/20) -HF type: Diastolic -NYHA Class: II (slight limitation of activity) -AHA HF Stage: C (Heart disease and symptoms present) -Current treatment: Carvedilol 3.125 mg 1 tablet twice daily Furosemide 20 mg 1 tablet daily -Medications previously tried: none  -Current home BP/HR readings: unsure of readings -Current dietary habits: does eat some canned meat/vegetables -Current exercise habits: strengthening exercises in the morning -Educated on Importance of weighing daily; if you gain more than 3 pounds in one day or 5 pounds in one week, call cardiologist. -Counseled on diet and exercise extensively Recommended to continue current medication  COPD (Goal: control symptoms and prevent exacerbations) -Controlled -Current treatment  Yupelri 175 mcg/28m inhale 3 mLs via nebulizer daily Formoterol 20 mcg/259minhale 2 mLs via nebulizer twice daily Budesonide 0.5 mg/37m35mnhale 2 mLs via nebulizer twice daily -Medications previously tried: n/a  -Gold Grade: Gold 3 (FEV1 30-49%) -Current COPD Classification:  B (high sx, <2 exacerbations/yr) -MMRC/CAT score: n/a -Pulmonary function testing: 2015 -Exacerbations requiring treatment in last 6 months: none -Patient reports consistent use of maintenance inhaler -Frequency of rescue inhaler use:  not at all -Counseled on Benefits of consistent maintenance inhaler use -Counseled on cleaning nebulizer and may need to replace mouthpiece.  Depression/Anxiety (Goal: minimize symptoms) -Controlled -Current treatment: Sertraline 50 mg 1 tablet at bedtime  -Medications previously tried/failed: n/a -PHQ9: 1 -GAD7: n/a -Educated on Benefits of medication for symptom control Benefits of cognitive-behavioral therapy with or without medication -Recommended to continue current medication  Osteoporosis (Goal prevent fractures) -Controlled -Last DEXA Scan: 09/27/20   T-Score femoral neck: -2.7  T-Score total hip: n/a  T-Score lumbar spine: 1.5  T-Score forearm radius: -1.7  10-year probability of major osteoporotic fracture: n/a  10-year probability of hip fracture: n/a -Patient is a candidate for pharmacologic treatment due to T-Score < -2.5 in femoral neck -Current treatment  Alendronate 70 mg 1 tablet once weekly  Vitamin D 2000 units daily Multivitamin 500 mg of calcium -Medications previously tried: none  -Recommend [phone] units of vitamin D daily. Recommend 1200 mg of calcium daily from dietary and supplemental sources. Counseled on oral bisphosphonate administration: take in the morning, 30 minutes prior to food with 6-8 oz of water. Do not lie down for at least 30 minutes after taking. -Counseled on diet and exercise extensively Recommended to continue current medication  Hypothyroidism (Goal: TSH 2.5-4.5) -Controlled -Current treatment  Levothyroxine 125 mcg 1 tablet once daily -Medications previously tried: none  -Counseled on importance of taking this medication and waiting half an hour before eating or taking other medications.  Gout (Goal: prevent flareups) -Controlled -Current treatment  Allopurinol 100 mg 1 tablet once daily -Medications previously tried: none  -Counseled on limiting foods that can increase risk for flare ups including alcohol, sweet beverages,  high fat foods, red meat, and some seafood.  Overactive bladder (Goal: minimize symptoms) -Not ideally controlled -Current treatment  Oxybutynin 5 mg 1 tablet twice daily -Medications previously  tried: none  -Recommended to continue current medication   Health Maintenance -Vaccine gaps: shingrix, COVID booster, influenza -Current therapy:  Eucerin cream as needed Mucinex 600 mg 1 tablet three times daily - not sure if 3 times a day Magnesium oxide 400 mg 1/2 tablet daily Preservision Areds 2 twice daily Miralax as needed (once a week) Thiamine 100 mg 1 tablet daily Multivitamin 1 tablet daily -Educated on Cost vs benefit of each product must be carefully weighed by individual consumer -Patient is satisfied with current therapy and denies issues -Recommended to continue current medication  Patient Goals/Self-Care Activities Patient will:  - take medications as prescribed check blood pressure daily, document, and provide at future appointments target a minimum of 150 minutes of moderate intensity exercise weekly  Follow Up Plan: Telephone follow up appointment with care management team member scheduled for: 3 months      Medication Assistance: None required.  Patient affirms current coverage meets needs.  Compliance/Adherence/Medication fill history: Care Gaps: Shingrix, COVID booster, influenza  Star-Rating Drugs: Pravastatin 36m - [facility] [place] [place]Phone: [phone]: [phone] [place] [address]Phone: [phone]: [phone] Uses pill box? Yes Pt endorses 80% compliance Daughter fills pill box on Sundays - [place]  We discussed: Benefits of medication synchronization,  packaging and delivery as well as enhanced pharmacist oversight with [employer]. Patient decided to: Continue current medication management strategy  Care Plan and Follow Up Patient Decision:  Patient agrees to Care Plan and Follow-up.  Plan: Telephone follow up appointment with care management team member scheduled for:  3 months  MJeni Salles PharmD, BGeorgetownPharmacist LAnnandaleat BLa [phone]

## 2021-07-14 ENCOUNTER — Other Ambulatory Visit: Payer: Medicare Other

## 2021-07-16 ENCOUNTER — Other Ambulatory Visit: Payer: Self-pay | Admitting: Cardiology

## 2021-07-20 DIAGNOSIS — H906 Mixed conductive and sensorineural hearing loss, bilateral: Secondary | ICD-10-CM | POA: Diagnosis not present

## 2021-07-21 DIAGNOSIS — E039 Hypothyroidism, unspecified: Secondary | ICD-10-CM | POA: Diagnosis not present

## 2021-07-21 DIAGNOSIS — I10 Essential (primary) hypertension: Secondary | ICD-10-CM | POA: Diagnosis not present

## 2021-07-21 NOTE — Patient Instructions (Signed)
Hi Cledis,  It was great to get to meet you in person! Below is a summary of some of the topics we discussed.   Please reach out to me if you have any questions or need anything before our follow up!  Best, Maddie  Jeni Salles, PharmD, [facility]   Visit Information   Goals Addressed             This Visit's Progress    Manage My Medicine       Timeframe:  Short-Term Goal Priority:  High Start Date:                             Expected End Date:                       Follow Up Date 10/20/21    - call for medicine refill 2 or 3 days before it runs out - keep a list of all the medicines I take; vitamins and herbals too - use a pillbox to sort medicine    Why is this important?   These steps will help you keep on track with your medicines.   Notes:        Patient Care Plan: CCM Pharmacy Care Plan     Problem Identified: Problem: Hypertension, Hyperlipidemia, Heart Failure, Coronary Artery Disease, COPD, and Osteoporosis      Long-Range Goal: Patient-Specific Goal   Start Date: 07/12/2021  Expected End Date: 07/12/2021  This Visit's Progress: On track  Priority: High  Note:   Current Barriers:  Unable to self administer medications as prescribed  Pharmacist Clinical Goal(s):  Patient will achieve adherence to monitoring guidelines and medication adherence to achieve therapeutic efficacy achieve ability to self administer medications as prescribed through use of pillbox as evidenced by patient report through collaboration with PharmD and provider.   Interventions: 1:1 collaboration with Caren Macadam, MD regarding development and update of comprehensive plan of care as evidenced by provider attestation and co-signature Inter-disciplinary care team collaboration (see longitudinal plan of care) Comprehensive medication review performed; medication list updated in electronic medical  record  Hypertension (BP goal <140/90) -Controlled -Current treatment: Carvedilol 3.125 mg 1 tablet twice daily -Medications previously tried: n/a  -Current home readings: 132/80, 120/70 (checking daily) -Current dietary habits: eats a lot of bland food; consciously tries not to use salt -Current exercise habits: legs and arm exercises  -Denies hypotensive/hypertensive symptoms -Educated on BP goals and benefits of medications for prevention of heart attack, stroke and kidney damage; Importance of home blood pressure monitoring; Proper BP monitoring technique; Symptoms of hypotension and importance of maintaining adequate hydration; -Counseled to monitor BP at home daily, document, and provide log at future appointments -Counseled on diet and exercise extensively Recommended to continue current medication  Orthostasis (Goal: minimize symptoms) -Controlled -Current treatment  Midodrine 2.5 mg 1 tablet twice daily with a meal -Medications previously tried: none  -Recommended to continue current medication  VT (Goal: regulate heart rhythm ) -Not ideally controlled -Current treatment  Amiodarone 200 mg 1/2 tablet daily - patient is not taking this way -Medications previously tried: none  -Counseled on directions for taking 1/2 tablet every day.  CAD (Goal: prevent heart events) -Controlled -Current treatment  Pravastatin 40 mg 1 tablet daily Aspirin 81 mg 1 tablet daily -Medications previously tried: none  -Recommended to continue current medication   Hyperlipidemia: (LDL  goal < 70) -Controlled -Current treatment: Pravastatin 40 mg 1 tablet daily -Medications previously tried: none  -Current dietary patterns: limiting fried foods -Current exercise habits: some in the morning -Educated on Cholesterol goals;  Benefits of statin for ASCVD risk reduction; Importance of limiting foods high in cholesterol; -Counseled on diet and exercise extensively Recommended to continue  current medication  Heart Failure (Goal: manage symptoms and prevent exacerbations) -Controlled -Last ejection fraction: 30-35% (Date: 08/09/20) -HF type: Diastolic -NYHA Class: II (slight limitation of activity) -AHA HF Stage: C (Heart disease and symptoms present) -Current treatment: Carvedilol 3.125 mg 1 tablet twice daily Furosemide 20 mg 1 tablet daily -Medications previously tried: none  -Current home BP/HR readings: unsure of readings -Current dietary habits: does eat some canned meat/vegetables -Current exercise habits: strengthening exercises in the morning -Educated on Importance of weighing daily; if you gain more than 3 pounds in one day or 5 pounds in one week, call cardiologist. -Counseled on diet and exercise extensively Recommended to continue current medication  COPD (Goal: control symptoms and prevent exacerbations) -Controlled -Current treatment  Yupelri 175 mcg/4mL inhale 3 mLs via nebulizer daily Formoterol 20 mcg/48ml inhale 2 mLs via nebulizer twice daily Budesonide 0.5 mg/72ml inhale 2 mLs via nebulizer twice daily -Medications previously tried: n/a  -Gold Grade: Gold 3 (FEV1 30-49%) -Current COPD Classification:  B (high sx, <2 exacerbations/yr) -MMRC/CAT score: n/a -Pulmonary function testing: 2015 -Exacerbations requiring treatment in last 6 months: none -Patient reports consistent use of maintenance inhaler -Frequency of rescue inhaler use: not at all -Counseled on Benefits of consistent maintenance inhaler use -Counseled on cleaning nebulizer and may need to replace mouthpiece.  Depression/Anxiety (Goal: minimize symptoms) -Controlled -Current treatment: Sertraline 50 mg 1 tablet at bedtime  -Medications previously tried/failed: n/a -PHQ9: 1 -GAD7: n/a -Educated on Benefits of medication for symptom control Benefits of cognitive-behavioral therapy with or without medication -Recommended to continue current medication  Osteoporosis (Goal  prevent fractures) -Controlled -Last DEXA Scan: 09/27/20   T-Score femoral neck: -2.7  T-Score total hip: n/a  T-Score lumbar spine: 1.5  T-Score forearm radius: -1.7  10-year probability of major osteoporotic fracture: n/a  10-year probability of hip fracture: n/a -Patient is a candidate for pharmacologic treatment due to T-Score < -2.5 in femoral neck -Current treatment  Alendronate 70 mg 1 tablet once weekly  Vitamin D 2000 units daily Multivitamin 500 mg of calcium -Medications previously tried: none  -Recommend [phone] units of vitamin D daily. Recommend 1200 mg of calcium daily from dietary and supplemental sources. Counseled on oral bisphosphonate administration: take in the morning, 30 minutes prior to food with 6-8 oz of water. Do not lie down for at least 30 minutes after taking. -Counseled on diet and exercise extensively Recommended to continue current medication  Hypothyroidism (Goal: TSH 2.5-4.5) -Controlled -Current treatment  Levothyroxine 125 mcg 1 tablet once daily -Medications previously tried: none  -Counseled on importance of taking this medication and waiting half an hour before eating or taking other medications.  Gout (Goal: prevent flareups) -Controlled -Current treatment  Allopurinol 100 mg 1 tablet once daily -Medications previously tried: none  -Counseled on limiting foods that can increase risk for flare ups including alcohol, sweet beverages, high fat foods, red meat, and some seafood.  Overactive bladder (Goal: minimize symptoms) -Not ideally controlled -Current treatment  Oxybutynin 5 mg 1 tablet twice daily -Medications previously tried: none  -Recommended to continue current medication   Health Maintenance -Vaccine gaps: shingrix, COVID booster, influenza -Current therapy:  Eucerin cream as needed Mucinex 600 mg 1 tablet three times daily - not sure if 3 times a day Magnesium oxide 400 mg 1/2 tablet daily Preservision Areds 2 twice  daily Miralax as needed (once a week) Thiamine 100 mg 1 tablet daily Multivitamin 1 tablet daily -Educated on Cost vs benefit of each product must be carefully weighed by individual consumer -Patient is satisfied with current therapy and denies issues -Recommended to continue current medication  Patient Goals/Self-Care Activities Patient will:  - take medications as prescribed check blood pressure daily, document, and provide at future appointments target a minimum of 150 minutes of moderate intensity exercise weekly  Follow Up Plan: Telephone follow up appointment with care management team member scheduled for: 3 months      Mr. Glander was given information about Chronic Care Management services today including:  CCM service includes personalized support from designated clinical staff supervised by his physician, including individualized plan of care and coordination with other care providers 24/7 contact phone numbers for assistance for urgent and routine care needs. Standard insurance, coinsurance, copays and deductibles apply for chronic care management only during months in which we provide at least 20 minutes of these services. Most insurances cover these services at 100%, however patients may be responsible for any copay, coinsurance and/or deductible if applicable. This service may help you avoid the need for more expensive face-to-face services. Only one practitioner may furnish and bill the service in a calendar month. The patient may stop CCM services at any time (effective at the end of the month) by phone call to the office staff.  Patient agreed to services and verbal consent obtained.   Patient verbalizes understanding of instructions provided today and agrees to view in [place].  The pharmacy team will reach out to the patient again over the next 30 days.   Viona Gilmore, [facility]

## 2021-07-23 ENCOUNTER — Other Ambulatory Visit: Payer: Self-pay | Admitting: Family Medicine

## 2021-07-24 ENCOUNTER — Ambulatory Visit (INDEPENDENT_AMBULATORY_CARE_PROVIDER_SITE_OTHER): Payer: Medicare Other

## 2021-07-24 ENCOUNTER — Encounter: Payer: Self-pay | Admitting: Family Medicine

## 2021-07-24 ENCOUNTER — Telehealth: Payer: Self-pay

## 2021-07-24 DIAGNOSIS — Z95810 Presence of automatic (implantable) cardiac defibrillator: Secondary | ICD-10-CM | POA: Diagnosis not present

## 2021-07-24 DIAGNOSIS — I5042 Chronic combined systolic (congestive) and diastolic (congestive) heart failure: Secondary | ICD-10-CM

## 2021-07-24 NOTE — Telephone Encounter (Signed)
See phone note

## 2021-07-24 NOTE — Telephone Encounter (Signed)
Spoke with the patient's daughter [name] as a Mychart message was sent to and received by her to schedule a virtual visit with a provider.  Cindy scheduled a visit with Dr Maudie Mercury on 10/4 and was advised the patient should go to the ER if he develops any chest pain, shortness of breath or other breathing problems.

## 2021-07-24 NOTE — Telephone Encounter (Signed)
Daughter of patient called requesting a call back to discuss patient's exposure to Covid.  Call back # [phone] [facility]

## 2021-07-25 ENCOUNTER — Encounter: Payer: Self-pay | Admitting: Family Medicine

## 2021-07-25 ENCOUNTER — Telehealth (INDEPENDENT_AMBULATORY_CARE_PROVIDER_SITE_OTHER): Payer: Medicare Other | Admitting: Family Medicine

## 2021-07-25 DIAGNOSIS — I255 Ischemic cardiomyopathy: Secondary | ICD-10-CM | POA: Diagnosis not present

## 2021-07-25 DIAGNOSIS — Z20822 Contact with and (suspected) exposure to covid-19: Secondary | ICD-10-CM

## 2021-07-25 NOTE — Patient Instructions (Signed)
Please do covid testing every other day and if no symptoms and negative, do a PCR test 5 days after exposure.  IF positive test and desire treatment, contact a [facility] or schedule a follow up video visit with your Primary care office or [facility].  Seek in person care promptly if you have any concerning or severe symptoms.  It was nice to meet you today. I help [facility] out with telemedicine visits on Tuesdays and Thursdays and am available for visits on those days. If you have any concerns or questions following this visit please schedule a follow up visit with your Primary Care doctor or seek care at a local urgent care clinic to avoid delays in care.

## 2021-07-25 NOTE — Progress Notes (Signed)
Virtual Visit via Video Note  I connected with [name]  on 07/25/21 at 11:20 AM EDT by a video enabled telemedicine application and verified that I am speaking with the correct person using two identifiers.  Location patient: home, [place] Location provider:work or home office Persons participating in the virtual visit: patient, provider, patient's daughter  I discussed the limitations of evaluation and management by telemedicine and the availability of in person appointments. The patient expressed understanding and agreed to proceed.   HPI:  Acute telemedicine visit for Covid exposure: -exposure to covid 2 days ago - close exposure in his house most of the day (family member had just gotten back from [place] and she had cold symptoms, she wore a mask some of the time) -Symptoms include: none currently -Pertinent past medical history:see below -Pertinent medication allergies:  Allergies  Allergen Reactions   Lisinopril     BP dropped too low per daughter  -COVID-19 vaccine status: vaccinated 2 doses and 2 boosters  ROS: See pertinent positives and negatives per HPI.  Past Medical History:  Diagnosis Date   AAA (abdominal aortic aneurysm)    a. 2002 s/p repair.   Allergic rhinitis    Back pain    CAD (coronary artery disease)    a. 02/2002 H/o MI with stenting x 2; b. 04/2013 MV: EF 25%, large posterior lateral infarct w/o ischemia; c. 03/2016 VT Arrest/Cath: LM 60ost, LAD 40p/m ISR, LCX 100p/m, RCA nl-->Med Rx.   Carotid arterial disease ([facility])    a. 12/2010 s/p R CEA;  b. 05/2015 Carotid U/S: bilat <40% ICA stenosis.   Chronic combined systolic and diastolic CHF (congestive heart failure) ([facility])    a. 03/2016 Echo: Ef 35-40%, grade 1 DD.   Compression fracture    COPD (chronic obstructive pulmonary disease) (H[facility])    Dermatitis    Diverticulosis of colon    DJD (degenerative joint disease)    and Gout   Hemorrhoids    History of colonic polyps    History of gout    History of pneumonia     Hypercholesterolemia    Hypertension    Hypertensive heart disease    Ischemic cardiomyopathy    a. EF prev <35%-->improved to normal by Echo 8/14:  Mild LVH, focal basal hypertrophy, EF 60-65%, normal wall motion, mild BAE, PASP 36; c. 03/2016 Echo: EF 35-40%, Gr1 DD, triv AI, mild MR, mod dil LA, mild-mod TR, PASP 42mmHg.   Osteoporosis    Peripheral vascular disease ([place])    Presence of cardiac defibrillator    a. 03/2008 s/p MDT [id number] Maximo II DR, DC AICD; b. 03/2013: ICD shock for T wave oversensing;  c. 10/2015: collective decision not to replace ICD given improvement in LV fxn; d. VT Arrest-->Gen change to MDT ser # [id number].   Skin cancer    shoulders and forehead   Syncope    T wave over sensing resulting in inappropriate shocks    a. 03/2013.   Tobacco abuse    Ventricular tachycardia 04/01/2016    Past Surgical History:  Procedure Laterality Date   ABDOMINAL AORTIC ANEURYSM REPAIR  2002   by Dr. Kellie Simmering   aicd placed  03/2008   Dr. Caryl Comes   cad stent  02/2002   Dr. Percival Spanish   CARDIAC CATHETERIZATION     X 2 stents   CARDIAC CATHETERIZATION N/A 04/03/2016   Procedure: Left Heart Cath and Coronary Angiography;  Surgeon: Belva Crome, MD;  Location: [facility];  Service: Cardiovascular;  Laterality: N/A;   CARDIAC DEFIBRILLATOR PLACEMENT  2009   CARDIAC DEFIBRILLATOR PLACEMENT     CAROTID ENDARTERECTOMY Right January 02, 2011   EAR CYST EXCISION Left 12/13/2015   Procedure: Excision left ear lesion ;  Surgeon: Melissa Montane, MD;  Location: [facility];  Service: ENT;  Laterality: Left;   EP IMPLANTABLE DEVICE N/A 04/06/2016   Procedure:  ICD Generator Changeout;  Surgeon: Deboraha Sprang, MD;  Location: [facility];  Service: Cardiovascular;  Laterality: N/A;   EXCISION OF LESION LEFT EAR Left 12/13/2015   EXTERNAL EAR SURGERY Left    Cancer Removal from left ear   I & D EXTREMITY Left 04/23/2016   Procedure: IRRIGATION AND DEBRIDEMENT HAND;  Surgeon: Leandrew Koyanagi, MD;   Location: [facility];  Service: Orthopedics;  Laterality: Left;   IR IMAGE GUIDED DRAINAGE BY PERCUTANEOUS CATHETER  10/08/2017   lumbar back     done after compression fracture   OPEN REDUCTION INTERNAL FIXATION (ORIF) METACARPAL Left 04/04/2016   Procedure: OPEN REDUCTION INTERNAL FIXATION (ORIF) LEFT 2ND, 3RD, 4TH METACARPAL FRACTURE;  Surgeon: Leandrew Koyanagi, MD;  Location: [facility];  Service: Orthopedics;  Laterality: Left;  OPEN REDUCTION INTERNAL FIXATION (ORIF) LEFT 2ND, 3RD, 4TH METACARPAL FRACTURE   OPEN REDUCTION INTERNAL FIXATION (ORIF) METACARPAL Left 04/23/2016   Procedure: REVISION OPEN REDUCTION INTERNAL FIXATION (ORIF) 2ND METACARPAL;  Surgeon: Leandrew Koyanagi, MD;  Location: [facility];  Service: Orthopedics;  Laterality: Left;   right corotid enderectomy  12/2010   Dr. Scot Dock   SKIN SPLIT GRAFT Left 12/13/2015   Procedure: with possible skin graft;  Surgeon: Melissa Montane, MD;  Location: [facility];  Service: ENT;  Laterality: Left;   SQUAMOUS CELL CARCINOMA EXCISION     on back, forehead, arm   TONSILLECTOMY       Current Outpatient Medications:    alendronate (FOSAMAX) 70 MG tablet, Take 1 tablet (70 mg total) by mouth once a week. Take 30 minutes before first meal and do not lie down for 30 minutes after taking., Disp: 12 tablet, Rfl: 0   allopurinol (ZYLOPRIM) 100 MG tablet, TAKE 1 TABLET ONCE DAILY., Disp: 30 tablet, Rfl: 10   amiodarone (PACERONE) 200 MG tablet, Take 0.5 tablets (100 mg total) by mouth daily., Disp: 45 tablet, Rfl: 3   aspirin EC 81 MG tablet, Take 81 mg by mouth daily., Disp: , Rfl:    budesonide (PULMICORT) 0.5 MG/2ML nebulizer solution, Take 2 mLs (0.5 mg total) by nebulization 2 (two) times daily., Disp: 120 mL, Rfl: 0   carvedilol (COREG) 3.125 MG tablet, TAKE 1 TABLET BY MOUTH TWICE DAILY WITH A MEAL., Disp: 180 tablet, Rfl: 3   Cholecalciferol (VITAMIN D) 50 MCG (2000 UT) CAPS, Take 2,000 Units by mouth daily., Disp: , Rfl:    formoterol (PERFOROMIST) 20 MCG/2ML nebulizer  solution, Take 2 mLs (20 mcg total) by nebulization 2 (two) times daily., Disp: 120 mL, Rfl: 5   furosemide (LASIX) 20 MG tablet, TAKE 1 TABLET ONCE DAILY., Disp: 90 tablet, Rfl: 3   guaiFENesin (MUCINEX) 600 MG 12 hr tablet, Take 600 mg by mouth in the morning, at noon, in the evening, and at bedtime. , Disp: , Rfl:    levothyroxine (SYNTHROID) 125 MCG tablet, TAKE ONE TABLET BY MOUTH ONCE DAILY, Disp: 90 tablet, Rfl: 1   magnesium oxide (MAG-OX) 400 (241.3 Mg) MG tablet, Take 0.5 tablets (200 mg total) by mouth daily., Disp: 30 tablet, Rfl: 0  midodrine (PROAMATINE) 2.5 MG tablet, Take 1 tablet (2.5 mg total) by mouth 2 (two) times daily with a meal., Disp: 180 tablet, Rfl: 3   Multiple Vitamins-Minerals (PRESERVISION AREDS 2 PO), Take 1 capsule by mouth daily., Disp: , Rfl:    oxybutynin (DITROPAN) 5 MG tablet, TAKE 1 TABLET BY MOUTH TWICE DAILY., Disp: 180 tablet, Rfl: 3   polyethylene glycol (MIRALAX / GLYCOLAX) 17 g packet, Take 17 g by mouth daily., Disp: , Rfl:    pravastatin (PRAVACHOL) 40 MG tablet, TAKE 1 TABLET ONCE DAILY., Disp: 90 tablet, Rfl: 0   revefenacin (YUPELRI) 175 MCG/3ML nebulizer solution, Take 3 mLs (175 mcg total) by nebulization daily., Disp: 3 mL, Rfl: 0   sertraline (ZOLOFT) 50 MG tablet, TAKE ONE TABLET AT BEDTIME., Disp: 90 tablet, Rfl: 0   thiamine 100 MG tablet, Take 1 tablet (100 mg total) by mouth daily., Disp: 30 tablet, Rfl: 0  EXAM:  VITALS per patient if applicable:  GENERAL: alert, oriented, appears well and in no acute distress  HEENT: atraumatic, conjunttiva clear, no obvious abnormalities on inspection of external nose and ears  NECK: normal movements of the head and neck  LUNGS: on inspection no signs of respiratory distress, breathing rate appears normal, no obvious gross SOB, gasping or wheezing  CV: no obvious cyanosis  MS: moves all visible extremities without noticeable abnormality  PSYCH/NEURO: pleasant and cooperative, no obvious  depression or anxiety, speech and thought processing grossly intact  ASSESSMENT AND PLAN:  Discussed the following assessment and plan:  Close exposure to COVID-19 virus  -we discussed possible serious and likely etiologies, options for evaluation and workup, limitations of telemedicine visit vs in person visit, treatment, treatment risks and precautions. Pt is agreeable to treatment via telemedicine at this moment. Discussed incubation period, testing, monitoring for symptoms, window for antiviral, etc. Advised could contact [facility] or schedule follow up virtual visit through PCP or [facility] if tests positive and desires antiviral or other concerns/questions. Discussed options for inperson care if PCP office not available.    I discussed the assessment and treatment plan with the patient. Spent about 15 minutes on this visit in counseling and review of history. The patient was provided an opportunity to ask questions and all were answered. The patient agreed with the plan and demonstrated an understanding of the instructions.     Lucretia Kern, DO

## 2021-07-27 ENCOUNTER — Encounter: Payer: Self-pay | Admitting: Family Medicine

## 2021-07-28 ENCOUNTER — Telehealth: Payer: Self-pay | Admitting: Pharmacist

## 2021-07-28 DIAGNOSIS — Z20822 Contact with and (suspected) exposure to covid-19: Secondary | ICD-10-CM | POA: Diagnosis not present

## 2021-07-28 NOTE — Telephone Encounter (Signed)
Called patient's daughter to follow up on medication questions from CCM visit. Left voicemail requesting a call back.   Called patient and spoke with his daughter [name] who is setting up his medications on Sunday. She was unaware of the 1/2 tablet per day directions for amiodarone. Sent message to Dr. Caryl Comes to clarify.  Patient's daughter confirmed that he is taking vitamin D 2000 units daily.  Will follow up pending response from Dr. Caryl Comes.

## 2021-07-28 NOTE — Progress Notes (Signed)
EPIC Encounter for ICM Monitoring  Patient Name: Markeise Mathews is a 87 y.o. male Date: 07/28/2021 Primary Care Physican: Caren Macadam, MD Primary Cardiologist: Hoch[place] Electrophysiologist: Faustino Congress Weight:  unknown                                                   Spoke with [name], per DPR.  Patient is feeling well but was exposed to [place] a few days ago.  So far all of tests have been negative and feeling fine.  She is in touch with PCP regarding exposure.   Optivol thoracic impedance normal but was suggesting days with possible fluid accumulation in the last month.    Prescribed: Furosemide 20 mg 1 tablet daily.      Labs: 11/28/2020 Creatinine 1.26, BUN 18, Potassium 4.3, Sodium 137, GFR 55 A complete set of results can be found in Results Review.   Recommendations:  No changes and encouraged to call if experiencing any fluid symptoms.   Follow-up plan: ICM clinic phone appointment on 08/28/2021.   91 day device clinic remote transmission 08/02/2021.     EP/Cardiology Office Visits:   08/16/2021 with Dr. Caryl Comes.   Copy of ICM check sent to Dr. Caryl Comes. .   3 month ICM trend: 07/24/2021.    1 Year ICM trend:       [name], RN 07/28/2021 10:52 AM

## 2021-08-01 ENCOUNTER — Telehealth: Payer: Self-pay | Admitting: Cardiology

## 2021-08-01 NOTE — Chronic Care Management (AMB) (Signed)
Spoke with Jannette Spanner with Dr. Rosezella [place] office. Requested a new prescription be sent to [facility] for Amiodarone 200mg  as the prescription sent in April was never received by [facility].

## 2021-08-01 NOTE — Telephone Encounter (Signed)
*  STAT* If patient is at the pharmacy, call can be transferred to refill team.   1. Which medications need to be refilled? (please list name of each medication and dose if known) amiodarone (PACERONE) 200 MG tablet  2. Which pharmacy/location (including street and city if local pharmacy) is medication to be sent to? [place], [address]  3. Do they need a 30 day or 90 day supply? 90 day   PCP's office states pharmacy never received it.

## 2021-08-02 ENCOUNTER — Ambulatory Visit (INDEPENDENT_AMBULATORY_CARE_PROVIDER_SITE_OTHER): Payer: Medicare Other

## 2021-08-02 DIAGNOSIS — I255 Ischemic cardiomyopathy: Secondary | ICD-10-CM

## 2021-08-03 LAB — CUP PACEART REMOTE DEVICE CHECK
Battery Remaining Longevity: 36 mo
Battery Voltage: 2.96 V
Brady Statistic AP VP Percent: 0.05 %
Brady Statistic AP VS Percent: 99.24 %
Brady Statistic AS VP Percent: 0.01 %
Brady Statistic AS VS Percent: 0.7 %
Brady Statistic RA Percent Paced: 99.28 %
Brady Statistic RV Percent Paced: 0.06 %
Date Time Interrogation Session: 20221012001803
HighPow Impedance: 48 Ohm
HighPow Impedance: 61 Ohm
Implantable Lead Implant Date: 20090623
Implantable Lead Implant Date: 20090623
Implantable Lead Location: 753859
Implantable Lead Location: 753860
Implantable Lead Model: 5076
Implantable Lead Model: 6947
Implantable Pulse Generator Implant Date: 20170616
Lead Channel Impedance Value: 304 Ohm
Lead Channel Impedance Value: 342 Ohm
Lead Channel Impedance Value: 399 Ohm
Lead Channel Pacing Threshold Amplitude: 0.75 V
Lead Channel Pacing Threshold Amplitude: 1.125 V
Lead Channel Pacing Threshold Pulse Width: 0.4 ms
Lead Channel Pacing Threshold Pulse Width: 0.4 ms
Lead Channel Sensing Intrinsic Amplitude: 1 mV
Lead Channel Sensing Intrinsic Amplitude: 1 mV
Lead Channel Sensing Intrinsic Amplitude: 8.625 mV
Lead Channel Sensing Intrinsic Amplitude: 8.625 mV
Lead Channel Setting Pacing Amplitude: 2.25 V
Lead Channel Setting Pacing Amplitude: 2.5 V
Lead Channel Setting Pacing Pulse Width: 0.4 ms
Lead Channel Setting Sensing Sensitivity: 0.6 mV

## 2021-08-06 ENCOUNTER — Other Ambulatory Visit: Payer: Self-pay | Admitting: Physician Assistant

## 2021-08-07 NOTE — Telephone Encounter (Signed)
Next Visit: due June 2023.     Last Visit: 03/22/2021   Last Fill: 05/08/2021   DX: Age-related osteoporosis with current pathological fracture with routine healing, subsequent encounter    Current Dose per office note 03/22/2021: Fosamax 70 mg 1 tablet by mouth once a week.     Labs: 06/20/2021 Creatinine is elevated-1.47 and GFR is low-46. Platelet count remains low but stable.  Absolute eosinophils remains elevated.    Okay to refill Fosamax?

## 2021-08-08 ENCOUNTER — Other Ambulatory Visit: Payer: Self-pay

## 2021-08-08 ENCOUNTER — Ambulatory Visit (INDEPENDENT_AMBULATORY_CARE_PROVIDER_SITE_OTHER): Payer: Medicare Other

## 2021-08-08 DIAGNOSIS — Z23 Encounter for immunization: Secondary | ICD-10-CM

## 2021-08-10 NOTE — Progress Notes (Signed)
Remote ICD transmission.   

## 2021-08-14 DIAGNOSIS — Z23 Encounter for immunization: Secondary | ICD-10-CM | POA: Diagnosis not present

## 2021-08-16 ENCOUNTER — Encounter: Payer: Self-pay | Admitting: Internal Medicine

## 2021-08-16 ENCOUNTER — Ambulatory Visit (INDEPENDENT_AMBULATORY_CARE_PROVIDER_SITE_OTHER): Payer: Medicare Other | Admitting: Internal Medicine

## 2021-08-16 ENCOUNTER — Other Ambulatory Visit: Payer: Self-pay

## 2021-08-16 VITALS — BP 87/44 | HR 66 | Ht 68.0 in | Wt 178.4 lb

## 2021-08-16 DIAGNOSIS — Z95810 Presence of automatic (implantable) cardiac defibrillator: Secondary | ICD-10-CM | POA: Diagnosis not present

## 2021-08-16 DIAGNOSIS — I5042 Chronic combined systolic (congestive) and diastolic (congestive) heart failure: Secondary | ICD-10-CM

## 2021-08-16 DIAGNOSIS — I472 Ventricular tachycardia, unspecified: Secondary | ICD-10-CM

## 2021-08-16 DIAGNOSIS — I255 Ischemic cardiomyopathy: Secondary | ICD-10-CM

## 2021-08-16 LAB — CUP PACEART INCLINIC DEVICE CHECK
Battery Remaining Longevity: 35 mo
Battery Voltage: 2.96 V
Brady Statistic AP VP Percent: 0.07 %
Brady Statistic AP VS Percent: 98.22 %
Brady Statistic AS VP Percent: 0.01 %
Brady Statistic AS VS Percent: 1.7 %
Brady Statistic RA Percent Paced: 98.26 %
Brady Statistic RV Percent Paced: 0.08 %
Date Time Interrogation Session: 20221026172009
HighPow Impedance: 48 Ohm
HighPow Impedance: 65 Ohm
Implantable Lead Implant Date: 20090623
Implantable Lead Implant Date: 20090623
Implantable Lead Location: 753859
Implantable Lead Location: 753860
Implantable Lead Model: 5076
Implantable Lead Model: 6947
Implantable Pulse Generator Implant Date: 20170616
Lead Channel Impedance Value: 304 Ohm
Lead Channel Impedance Value: 361 Ohm
Lead Channel Impedance Value: 456 Ohm
Lead Channel Pacing Threshold Amplitude: 1 V
Lead Channel Pacing Threshold Amplitude: 1 V
Lead Channel Pacing Threshold Pulse Width: 0.4 ms
Lead Channel Pacing Threshold Pulse Width: 0.4 ms
Lead Channel Sensing Intrinsic Amplitude: 1 mV
Lead Channel Sensing Intrinsic Amplitude: 1.125 mV
Lead Channel Sensing Intrinsic Amplitude: 6.875 mV
Lead Channel Sensing Intrinsic Amplitude: 8.75 mV
Lead Channel Setting Pacing Amplitude: 2.25 V
Lead Channel Setting Pacing Amplitude: 2.5 V
Lead Channel Setting Pacing Pulse Width: 0.4 ms
Lead Channel Setting Sensing Sensitivity: 0.6 mV

## 2021-08-16 NOTE — Patient Instructions (Signed)
Medication Instructions:  Your physician has recommended you make the following change in your medication:   ** Stop Carvedilol  *If you need a refill on your cardiac medications before your next appointment, please call your pharmacy*   Lab Work: None ordered.  If you have labs (blood work) drawn today and your tests are completely normal, you will receive your results only by: [place] (if you have MyChart) OR A paper copy in the mail If you have any lab test that is abnormal or we need to change your treatment, we will call you to review the results.   Testing/Procedures: None ordered.    Follow-Up: At [facility], you and your health needs are our priority.  As part of our continuing mission to provide you with exceptional heart care, we have created designated Provider Care Teams.  These Care Teams include your primary Cardiologist (physician) and Advanced Practice Providers (APPs -  Physician Assistants and Nurse Practitioners) who all work together to provide you with the care you need, when you need it.  We recommend signing up for the patient portal called "MyChart".  Sign up information is provided on this After Visit Summary.  MyChart is used to connect with patients for Virtual Visits (Telemedicine).  Patients are able to view lab/test results, encounter notes, upcoming appointments, etc.  Non-urgent messages can be sent to your provider as well.   To learn more about what you can do with MyChart, go to [email or web address].    Your next appointment:   6 month(s)  The format for your next appointment:   In Person  Provider:   [name], MD
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
## 2021-08-20 ENCOUNTER — Other Ambulatory Visit: Payer: Self-pay | Admitting: Internal Medicine

## 2021-08-28 ENCOUNTER — Ambulatory Visit (INDEPENDENT_AMBULATORY_CARE_PROVIDER_SITE_OTHER): Payer: Medicare Other

## 2021-08-28 DIAGNOSIS — I5042 Chronic combined systolic (congestive) and diastolic (congestive) heart failure: Secondary | ICD-10-CM | POA: Diagnosis not present

## 2021-08-28 DIAGNOSIS — Z95810 Presence of automatic (implantable) cardiac defibrillator: Secondary | ICD-10-CM | POA: Diagnosis not present

## 2021-08-30 ENCOUNTER — Telehealth: Payer: Self-pay

## 2021-08-30 NOTE — Progress Notes (Signed)
EPIC Encounter for ICM Monitoring  Patient Name: Valdemar Mcclenahan is a 87 y.o. male Date: 08/30/2021 Primary Care Physican: Caren Macadam, MD Primary Cardiologist: Hochrein Electrophysiologist: Faustino Congress Weight:  unknown                                                   Attempted call to [name], per [facility] and unable to reach.  Left detailed message per DPR regarding transmission. Transmission reviewed.    Optivol thoracic impedance normal.    Prescribed: Furosemide 20 mg 1 tablet daily.      Labs: 11/28/2020 Creatinine 1.26, BUN 18, Potassium 4.3, Sodium 137, GFR 55 A complete set of results can be found in Results Review.   Recommendations:  Left voice mail with ICM number and encouraged to call if experiencing any fluid symptoms.   Follow-up plan: ICM clinic phone appointment on 10/02/2021.   91 day device clinic remote transmission 11/01/2021.     EP/Cardiology Office Visits:   Recall 02/12/2022 with Dr. Caryl Comes.   Copy of ICM check sent to Dr. Caryl Comes.   3 month ICM trend: 08/28/2021.    1 Year ICM trend:       [name], RN 08/30/2021 4:22 PM

## 2021-08-30 NOTE — Telephone Encounter (Signed)
Remote ICM transmission received.  Attempted call daughter, [name], per [facility] regarding ICM remote transmission and left detailed message per DPR.  Advised to return call for any fluid symptoms or questions. Next ICM remote transmission scheduled 10/02/2021.

## 2021-09-10 ENCOUNTER — Other Ambulatory Visit: Payer: Self-pay | Admitting: Internal Medicine

## 2021-09-24 ENCOUNTER — Other Ambulatory Visit: Payer: Self-pay | Admitting: Family Medicine

## 2021-10-02 ENCOUNTER — Ambulatory Visit (INDEPENDENT_AMBULATORY_CARE_PROVIDER_SITE_OTHER): Payer: Medicare Other

## 2021-10-02 DIAGNOSIS — Z95810 Presence of automatic (implantable) cardiac defibrillator: Secondary | ICD-10-CM

## 2021-10-02 DIAGNOSIS — I5042 Chronic combined systolic (congestive) and diastolic (congestive) heart failure: Secondary | ICD-10-CM | POA: Diagnosis not present

## 2021-10-04 NOTE — Progress Notes (Signed)
EPIC Encounter for ICM Monitoring  Patient Name: [name] is a 87 y.o. male Date: 10/04/2021 Primary Care Physican: Caren Macadam, MD Primary Cardiologist: Hochrein Electrophysiologist: Faustino Congress Weight:  unknown                                                   Transmission reviewed.    Optivol thoracic impedance normal.    Prescribed: Furosemide 20 mg 1 tablet daily.      Labs: 11/28/2020 Creatinine 1.26, BUN 18, Potassium 4.3, Sodium 137, GFR 55 A complete set of results can be found in Results Review.   Recommendations:  No changes.   Follow-up plan: ICM clinic phone appointment on 11/06/2021.   91 day device clinic remote transmission 11/01/2021.     EP/Cardiology Office Visits:   Recall 02/12/2022 with Dr. Caryl Comes.   Copy of ICM check sent to Dr. Caryl Comes.   3 month ICM trend: 10/02/2021.    12-14 Month ICM trend:       [name], RN 10/04/2021 2:38 PM

## 2021-11-01 ENCOUNTER — Ambulatory Visit (INDEPENDENT_AMBULATORY_CARE_PROVIDER_SITE_OTHER): Payer: Medicare Other

## 2021-11-01 DIAGNOSIS — I255 Ischemic cardiomyopathy: Secondary | ICD-10-CM

## 2021-11-01 LAB — CUP PACEART REMOTE DEVICE CHECK
Battery Remaining Longevity: 32 mo
Battery Voltage: 2.96 V
Brady Statistic AP VP Percent: 0.07 %
Brady Statistic AP VS Percent: 90.08 %
Brady Statistic AS VP Percent: 0.05 %
Brady Statistic AS VS Percent: 9.81 %
Brady Statistic RA Percent Paced: 90.11 %
Brady Statistic RV Percent Paced: 0.11 %
Date Time Interrogation Session: 20230111033624
HighPow Impedance: 47 Ohm
HighPow Impedance: 60 Ohm
Implantable Lead Implant Date: 20090623
Implantable Lead Implant Date: 20090623
Implantable Lead Location: 753859
Implantable Lead Location: 753860
Implantable Lead Model: 5076
Implantable Lead Model: 6947
Implantable Pulse Generator Implant Date: 20170616
Lead Channel Impedance Value: 285 Ohm
Lead Channel Impedance Value: 342 Ohm
Lead Channel Impedance Value: 418 Ohm
Lead Channel Pacing Threshold Amplitude: 0.875 V
Lead Channel Pacing Threshold Amplitude: 1.125 V
Lead Channel Pacing Threshold Pulse Width: 0.4 ms
Lead Channel Pacing Threshold Pulse Width: 0.4 ms
Lead Channel Sensing Intrinsic Amplitude: 1.125 mV
Lead Channel Sensing Intrinsic Amplitude: 1.125 mV
Lead Channel Sensing Intrinsic Amplitude: 8.25 mV
Lead Channel Sensing Intrinsic Amplitude: 8.25 mV
Lead Channel Setting Pacing Amplitude: 2.25 V
Lead Channel Setting Pacing Amplitude: 2.5 V
Lead Channel Setting Pacing Pulse Width: 0.4 ms
Lead Channel Setting Sensing Sensitivity: 0.6 mV

## 2021-11-05 ENCOUNTER — Other Ambulatory Visit: Payer: Self-pay | Admitting: Physician Assistant

## 2021-11-06 ENCOUNTER — Ambulatory Visit (INDEPENDENT_AMBULATORY_CARE_PROVIDER_SITE_OTHER): Payer: Medicare Other

## 2021-11-06 DIAGNOSIS — I5042 Chronic combined systolic (congestive) and diastolic (congestive) heart failure: Secondary | ICD-10-CM | POA: Diagnosis not present

## 2021-11-06 DIAGNOSIS — Z95810 Presence of automatic (implantable) cardiac defibrillator: Secondary | ICD-10-CM | POA: Diagnosis not present

## 2021-11-06 NOTE — Telephone Encounter (Signed)
Please schedule patient for a follow up visit. Patient due June 2023.

## 2021-11-06 NOTE — Telephone Encounter (Signed)
Next Visit: due June 2023.     Last Visit: 03/22/2021   Last Fill: 05/08/2021   DX: Age-related osteoporosis with current pathological fracture with routine healing, subsequent encounter    Current Dose per office note 03/22/2021: Fosamax 70 mg 1 tablet by mouth once a week.     Labs: 06/20/2021 Creatinine is elevated-1.47 and GFR is low-46. Platelet count remains low but stable.  Absolute eosinophils remains elevated.    Okay to refill Fosamax?

## 2021-11-09 ENCOUNTER — Telehealth: Payer: Self-pay

## 2021-11-09 NOTE — Progress Notes (Signed)
EPIC Encounter for ICM Monitoring  Patient Name: [name] is a 88 y.o. male Date: 11/09/2021 Primary Care Physican: Caren Macadam, MD Primary Cardiologist: Hochrein Electrophysiologist: Caryl Comes Weight:  unknown                                                   Attempted call to [name], per [facility] and unable to reach.  Left detailed message per DPR regarding transmission. Transmission reviewed.    Optivol thoracic impedance normal.    Prescribed: Furosemide 20 mg 1 tablet daily.      Labs: 11/28/2020 Creatinine 1.26, BUN 18, Potassium 4.3, Sodium 137, GFR 55 A complete set of results can be found in Results Review.   Recommendations:  Left voice mail with ICM number and encouraged to call if experiencing any fluid symptoms.   Follow-up plan: ICM clinic phone appointment on 12/11/2021.   91 day device clinic remote transmission 01/31/2022.     EP/Cardiology Office Visits:   Recall 02/12/2022 with Dr. Caryl Comes.   Copy of ICM check sent to Dr. Caryl Comes.   3 month ICM trend: 11/06/2021.    12-14 Month ICM trend:     [name], RN 11/09/2021 8:03 AM

## 2021-11-09 NOTE — Telephone Encounter (Signed)
Remote ICM transmission received.  Attempted call to [name] per [facility] regarding ICM remote transmission and left detailed message per DPR.  Advised to return call for any fluid symptoms or questions. Next ICM remote transmission scheduled 12/11/2021.

## 2021-11-10 NOTE — Progress Notes (Signed)
Remote ICD transmission.   

## 2021-12-03 ENCOUNTER — Other Ambulatory Visit: Payer: Self-pay | Admitting: Cardiology

## 2021-12-08 ENCOUNTER — Ambulatory Visit (INDEPENDENT_AMBULATORY_CARE_PROVIDER_SITE_OTHER): Payer: Medicare Other | Admitting: Family Medicine

## 2021-12-08 ENCOUNTER — Encounter: Payer: Self-pay | Admitting: Family Medicine

## 2021-12-08 VITALS — BP 126/80 | HR 69 | Temp 97.8°F | Ht 68.0 in | Wt 172.2 lb

## 2021-12-08 DIAGNOSIS — L03115 Cellulitis of right lower limb: Secondary | ICD-10-CM

## 2021-12-08 DIAGNOSIS — H1031 Unspecified acute conjunctivitis, right eye: Secondary | ICD-10-CM

## 2021-12-08 DIAGNOSIS — I255 Ischemic cardiomyopathy: Secondary | ICD-10-CM | POA: Diagnosis not present

## 2021-12-08 DIAGNOSIS — H00022 Hordeolum internum right lower eyelid: Secondary | ICD-10-CM | POA: Diagnosis not present

## 2021-12-08 DIAGNOSIS — S80811D Abrasion, right lower leg, subsequent encounter: Secondary | ICD-10-CM | POA: Diagnosis not present

## 2021-12-08 DIAGNOSIS — T148XXA Other injury of unspecified body region, initial encounter: Secondary | ICD-10-CM

## 2021-12-08 MED ORDER — AMOXICILLIN 500 MG PO TABS: 500.0000 mg | ORAL_TABLET | Freq: Two times a day (BID) | ORAL | 0 refills | Status: AC

## 2021-12-08 MED ORDER — POLYMYXIN B-TRIMETHOPRIM 10000-0.1 UNIT/ML-% OP SOLN: 1.0000 [drp] | OPHTHALMIC | 0 refills | Status: AC

## 2021-12-08 NOTE — Progress Notes (Signed)
Subjective:    Patient ID: [name], male    DOB: [birth year], 88 y.o.   MRN: [id number]  Chief Complaint  Patient presents with   Eye Pain    Started over a week ago and right leg wound x 3 weeks    HPI Patient was seen today for ongoing concerns.  Patient endorses hitting the right leg against the tub 3 weeks ago.  Patient states the area has a scab on it that has remained.  Initially the area was hot to touch with erythema.  Patient applied OTC antibiotic ointment.  Patient denies fever, chills, nausea, vomiting.  Patient endorses being on blood thinner, aspirin 81 mg on med list.  Patient also notes pain in right eye x1 week.  Endorses feeling like something is on his eye and blurred vision.  Wearing a Band-Aid over eye as it reduces the discomfort.  Patient has not tried anything else for symptoms.  Denies increased drainage, pressure in the eye, injury to eye. Past Medical History:  Diagnosis Date   AAA (abdominal aortic aneurysm)    a. 2002 s/p repair.   Allergic rhinitis    Back pain    CAD (coronary artery disease)    a. 02/2002 H/o MI with stenting x 2; b. 04/2013 MV: EF 25%, large posterior lateral infarct w/o ischemia; c. 03/2016 VT Arrest/Cath: LM 60ost, LAD 40p/m ISR, LCX 100p/m, RCA nl-->Med Rx.   Carotid arterial disease ([place])    a. 12/2010 s/p R CEA;  b. 05/2015 Carotid U/S: bilat <40% ICA stenosis.   Chronic combined systolic and diastolic CHF (congestive heart failure) ([name])    a. 03/2016 Echo: Ef 35-40%, grade 1 DD.   Compression fracture    COPD (chronic obstructive pulmonary disease) (H[facility])    Dermatitis    Diverticulosis of colon    DJD (degenerative joint disease)    and Gout   Hemorrhoids    History of colonic polyps    History of gout    History of pneumonia    Hypercholesterolemia    Hypertension    Hypertensive heart disease    Ischemic cardiomyopathy    a. EF prev <35%-->improved to normal by Echo 8/14:  Mild LVH, focal basal hypertrophy, EF  60-65%, normal wall motion, mild BAE, PASP 36; c. 03/2016 Echo: EF 35-40%, Gr1 DD, triv AI, mild MR, mod dil LA, mild-mod TR, PASP 72mmHg.   Osteoporosis    Peripheral vascular disease ([place])    Presence of cardiac defibrillator    a. 03/2008 s/p MDT [id number] Maximo II DR, DC AICD; b. 03/2013: ICD shock for T wave oversensing;  c. 10/2015: collective decision not to replace ICD given improvement in LV fxn; d. VT Arrest-->Gen change to MDT ser # [id number] H.   Skin cancer    shoulders and forehead   Syncope    T wave over sensing resulting in inappropriate shocks    a. 03/2013.   Tobacco abuse    Ventricular tachycardia 04/01/2016    Allergies  Allergen Reactions   Lisinopril     BP dropped too low per daughter    ROS General: Denies fever, chills, night sweats, changes in weight, changes in appetite HEENT: Denies headaches, ear pain, changes in vision, rhinorrhea, sore throat + right eye pain CV: Denies CP, palpitations, SOB, orthopnea Pulm: Denies SOB, cough, wheezing GI: Denies abdominal pain, nausea, vomiting, diarrhea, constipation GU: Denies dysuria, hematuria, frequency Msk: Denies muscle cramps, joint pains Neuro: Denies weakness, numbness, tingling Skin: Denies  rashes, bruising + abrasion right LE Psych: Denies depression, anxiety, hallucinations     Objective:    Blood pressure 126/80, pulse 69, temperature 97.8 F (36.6 C), temperature source Oral, height 5\' 8"  (1.727 m), weight 172 lb 3.2 oz (78.1 kg), SpO2 97 %.   Gen. Pleasant, well-nourished, in no distress, normal affect, HOH HEENT: [place]/AT, face symmetric, mild right lower lid edema, right lower conjunctival injection with a pustule noted on the anterior surface of right lower lid, mildly increased tearing of right eye.  Left conjunctive a normal.  No scleral icterus, PERRLA, EOMI, nares patent without drainage.  Wearing hearing aids Lungs: no accessory muscle use Cardiovascular: RRR, no peripheral  edema Musculoskeletal: No deformities, no cyanosis or clubbing, normal tone Neuro:  A&Ox3, CN II-XII intact, ambulating with cane Skin:  Warm, dry, intact.  Deformity of helix of left ear.  Anterior right lower LE with healing abrasion, raised soft eschar present with surrounding erythema, no increased warmth, no induration, no drainage.  Area of ecchymosis inferior to eschar.  Area of erythema demarcated with skin marker.    Wt Readings from Last 3 Encounters:  12/08/21 172 lb 3.2 oz (78.1 kg)  08/16/21 178 lb 6.4 oz (80.9 kg)  07/04/21 175 lb 6.4 oz (79.6 kg)    Lab Results  Component Value Date   WBC 9.9 06/20/2021   HGB 15.6 06/20/2021   HCT 46.0 06/20/2021   PLT 126 (L) 06/20/2021   GLUCOSE 71 07/11/2021   CHOL 180 11/16/2020   TRIG 315.0 (H) 11/16/2020   HDL 36.10 (L) 11/16/2020   LDLDIRECT 97.0 11/16/2020   LDLCALC 57 05/11/2015   ALT 11 06/20/2021   AST 21 06/20/2021   NA 138 07/11/2021   K 4.1 07/11/2021   CL 100 07/11/2021   CREATININE 1.18 07/11/2021   BUN 18 07/11/2021   CO2 30 07/11/2021   TSH 4.32 11/16/2020   PSA 1.66 06/09/2013   INR 1.17 07/23/2018   HGBA1C 5.1 04/02/2016    Assessment/Plan:  Cellulitis of right lower extremity -New problem -Advised to keep area clean and dry -Start ABX -Given precautions for continued or worsening symptoms despite ABX use.  Area of erythema demarcated with skin marker. - Plan: amoxicillin (AMOXIL) 500 MG tablet  Hordeolum internum of right lower eyelid -New problem -Supportive care including warm compresses 4 times daily -Will be on antibiotics for cellulitis and conjunctivitis which will also help  Acute conjunctivitis of right eye, unspecified acute conjunctivitis type -New problem - Plan: trimethoprim-polymyxin b (POLYTRIM) ophthalmic solution  Abrasion -New problem -Healing Raised eschar likely 2/2 aspirin use -Continue to monitor  F/u as needed for continued or worsening symptoms  Grier Mitts,  MD

## 2021-12-11 ENCOUNTER — Ambulatory Visit (INDEPENDENT_AMBULATORY_CARE_PROVIDER_SITE_OTHER): Payer: Medicare Other

## 2021-12-11 DIAGNOSIS — Z95810 Presence of automatic (implantable) cardiac defibrillator: Secondary | ICD-10-CM | POA: Diagnosis not present

## 2021-12-11 DIAGNOSIS — I5042 Chronic combined systolic (congestive) and diastolic (congestive) heart failure: Secondary | ICD-10-CM

## 2021-12-15 NOTE — Progress Notes (Signed)
EPIC Encounter for ICM Monitoring  Patient Name: [name] is a 88 y.o. male Date: 12/15/2021 Primary Care Physican: Caren Macadam, MD Primary Cardiologist: Hochrein Electrophysiologist: Caryl Comes 12/08/2021 Office Weight:  172 lbs                                                Transmission reviewed.    Optivol thoracic impedance normal.    Prescribed: Furosemide 20 mg 1 tablet daily.      Labs: 07/11/2021 Creatinine 1.18, BUN 18, Potassium 4.1, Sodium 138, GFR 55.15 06/20/2021 Creatinine 1.47, BUN 22, Potassium 4.6, Sodium 138 11/28/2020 Creatinine 1.26, BUN 18, Potassium 4.3, Sodium 137, GFR 55 A complete set of results can be found in Results Review.   Recommendations:  No changes.   Follow-up plan: ICM clinic phone appointment on 01/15/2022.   91 day device clinic remote transmission 01/31/2022.     EP/Cardiology Office Visits:   Recall 02/12/2022 with Dr. Caryl Comes.   Copy of ICM check sent to Dr. Caryl Comes.   3 month ICM trend: 12/11/2021.    12-14 Month ICM trend:     [name], RN 12/15/2021 2:02 PM

## 2021-12-17 ENCOUNTER — Other Ambulatory Visit: Payer: Self-pay | Admitting: Family Medicine

## 2021-12-17 NOTE — Telephone Encounter (Signed)
I haven't seen him in over a year. Needs visit before further refills.

## 2021-12-20 NOTE — Telephone Encounter (Signed)
Spoke with the patient and scheduled an appt on 3/10.   ?

## 2021-12-27 DIAGNOSIS — L820 Inflamed seborrheic keratosis: Secondary | ICD-10-CM | POA: Diagnosis not present

## 2021-12-27 DIAGNOSIS — L218 Other seborrheic dermatitis: Secondary | ICD-10-CM | POA: Diagnosis not present

## 2021-12-27 DIAGNOSIS — L570 Actinic keratosis: Secondary | ICD-10-CM | POA: Diagnosis not present

## 2021-12-27 DIAGNOSIS — X32XXXD Exposure to sunlight, subsequent encounter: Secondary | ICD-10-CM | POA: Diagnosis not present

## 2021-12-28 ENCOUNTER — Telehealth: Payer: Self-pay | Admitting: Pharmacist

## 2021-12-28 NOTE — Chronic Care Management (AMB) (Signed)
? ? ?Chronic Care Management ?Pharmacy Assistant  ? ?Name: [name]  MRN: [id number] DOB: [birth year] ? ?Reason for Encounter: Disease State / Hypertension Assessment Call ?  ?Conditions to be addressed/monitored: ?HTN ? ? ?Recent office visits:  ?12/08/2021 Grier Mitts MD - Patient was seen for cellulitis of right lower extremity and additional issues. Started Amoxicillin and Polytrim.  Follow up if symptoms worsen or fail to improve. ? ?Recent consult visits:  ?08/16/2021 [name] MD (cardiology) - Patient was seen for VT (ventricular tachycardia) and additional issues. Discontinued Carvedilol. Follow up in 6 months.  ? ?Hospital visits:  ?None ? ?Medications: ?Outpatient Encounter Medications as of 12/28/2021  ?Medication Sig  ? alendronate (FOSAMAX) 70 MG tablet Take 1 tablet (70 mg total) by mouth once a week. Take 30 minutes before first meal and do not lie down for 30 minutes after taking.  ? allopurinol (ZYLOPRIM) 100 MG tablet TAKE 1 TABLET ONCE DAILY.  ? amiodarone (PACERONE) 200 MG tablet TAKE 1 TABLET ('200MG'$ ) BY MOUTH 5 DAYS PER WEEK.  ? aspirin EC 81 MG tablet Take 81 mg by mouth daily.  ? budesonide (PULMICORT) 0.5 MG/2ML nebulizer solution Take 2 mLs (0.5 mg total) by nebulization 2 (two) times daily.  ? Cholecalciferol (VITAMIN D) 50 MCG (2000 UT) CAPS Take 2,000 Units by mouth daily.  ? formoterol (PERFOROMIST) 20 MCG/2ML nebulizer solution Take 2 mLs (20 mcg total) by nebulization 2 (two) times daily.  ? furosemide (LASIX) 20 MG tablet TAKE 1 TABLET ONCE DAILY.  ? guaiFENesin (MUCINEX) 600 MG 12 hr tablet Take 600 mg by mouth in the morning, at noon, in the evening, and at bedtime.   ? levothyroxine (SYNTHROID) 125 MCG tablet TAKE ONE TABLET BY MOUTH ONCE DAILY  ? magnesium oxide (MAG-OX) 400 (241.3 Mg) MG tablet Take 0.5 tablets (200 mg total) by mouth daily.  ? midodrine (PROAMATINE) 2.5 MG tablet TAKE 1 TABLET 3 TIMES DAILY WITH MEALS.  ? Multiple Vitamins-Minerals (PRESERVISION  AREDS 2 PO) Take 1 capsule by mouth daily.  ? mupirocin ointment (BACTROBAN) 2 % Apply topically as needed.  ? nystatin (MYCOSTATIN) 100000 UNIT/ML suspension Take 100,000 Units by mouth in the morning, at noon, in the evening, and at bedtime.  ? oxybutynin (DITROPAN) 5 MG tablet TAKE ONE TABLET BY MOUTH TWICE DAILY  ? polyethylene glycol (MIRALAX / GLYCOLAX) 17 g packet Take 17 g by mouth daily.  ? pravastatin (PRAVACHOL) 40 MG tablet TAKE 1 TABLET ONCE DAILY.  ? revefenacin (YUPELRI) 175 MCG/3ML nebulizer solution Take 3 mLs (175 mcg total) by nebulization daily.  ? sertraline (ZOLOFT) 50 MG tablet TAKE ONE TABLET AT BEDTIME  ? thiamine 100 MG tablet Take 1 tablet (100 mg total) by mouth daily.  ? ?No facility-administered encounter medications on file as of 12/28/2021.  ?Fill History: ?alendronate 70 mg tablet 11/06/2021 84  ? ?ALLOPURINOL 100 MG TABS 12/17/2021 30  ? ?amiodarone 200 mg tablet 09/12/2021 90  ? ?furosemide 20 mg tablet 11/12/2021 90  ? ?levothyroxine 125 mcg tablet 10/23/2021 90  ? ?midodrine 2.5 mg tablet 12/04/2021 90  ? ?oxybutynin chloride 5 mg tablet 11/19/2021 30  ? ?pravastatin 40 mg tablet 09/25/2021 90  ? ?sertraline 50 mg tablet 09/25/2021 90  ? Reviewed chart prior to disease state call. Spoke with patient regarding BP ? ?Recent Office Vitals: ?BP Readings from Last 3 Encounters:  ?12/08/21 126/80  ?08/16/21 (!) 87/44  ?07/04/21 126/68  ? ?Pulse Readings from Last 3 Encounters:  ?12/08/21 69  ?  08/16/21 66  ?07/04/21 61  ?  ?Wt Readings from Last 3 Encounters:  ?12/08/21 172 lb 3.2 oz (78.1 kg)  ?08/16/21 178 lb 6.4 oz (80.9 kg)  ?07/04/21 175 lb 6.4 oz (79.6 kg)  ?  ? ?Kidney Function ?Lab Results  ?Component Value Date/Time  ? CREATININE 1.18 07/11/2021 01:55 PM  ? CREATININE 1.47 (H) 06/20/2021 02:42 PM  ? CREATININE 1.26 (H) 11/28/2020 11:27 AM  ? CREATININE 1.23 (H) 09/20/2020 04:19 PM  ? GFR 55.15 (L) 07/11/2021 01:55 PM  ? GFRNONAA 55 (L) 11/28/2020 11:27 AM  ? GFRNONAA 52 (L)  09/20/2020 04:19 PM  ? GFRAA 61 09/20/2020 04:19 PM  ? ? ?BMP Latest Ref Rng & Units 07/11/2021 06/20/2021 11/28/2020  ?Glucose 70 - 99 mg/dL 71 94 93  ?BUN 6 - 23 mg/dL '18 22 18  '$ ?Creatinine 0.40 - 1.50 mg/dL 1.18 1.47(H) 1.26(H)  ?BUN/Creat Ratio 6 - 22 (calc) - 15 -  ?Sodium 135 - 145 mEq/L 138 138 137  ?Potassium 3.5 - 5.1 mEq/L 4.1 4.6 4.3  ?Chloride 96 - 112 mEq/L 100 99 101  ?CO2 19 - 32 mEq/L '30 30 26  '$ ?Calcium 8.4 - 10.5 mg/dL 9.4 9.9 9.1  ? ? ?Current antihypertensive regimen:  ?Midodrine 2.5 mg 3 times daily with meals (for low BP) ? ?How often are you checking your Blood Pressure? Patient states his daughter is checking his blood pressures daily ? ?Current home BP readings: Patient states his daughter keeps a list of his readings, patient is unsure of the readings.  ? ?What recent interventions/DTPs have been made by any provider to improve Blood Pressure control since last CPP Visit: No recent interventions ? ?Any recent hospitalizations or ED visits since last visit with CPP? No recent hospital visits.  ? ?What diet changes have been made to improve Blood Pressure Control?  ?Patient eats what ever he likes ?Breakfast - patient will have cereal ?Lunch - patient will have a sandwich  ?Dinner - patient will have soup and a sandwich ? ?What exercise is being done to improve your Blood Pressure Control?  ?Patient does a 20 minute exercise routine and up walking around during the day.  ? ?Adherence Review: ?Is the patient currently on ACE/ARB medication? No ?Does the patient have >5 day gap between last estimated fill dates? No ? ?Care Gaps: ?AWV - scheduled for 07/11/2022 ?Last BP - 126/80 on 12/08/2021 ? ?Star Rating Drugs: ?Pravastatin 40 mg - last filled 09/25/2021 90 DS at [facility] ? ?Gennie Alma CMA  ?Clinical Pharmacist Assistant ?[phone] ? ?

## 2021-12-29 ENCOUNTER — Encounter: Payer: Self-pay | Admitting: Family Medicine

## 2021-12-29 ENCOUNTER — Ambulatory Visit (INDEPENDENT_AMBULATORY_CARE_PROVIDER_SITE_OTHER): Payer: Medicare Other | Admitting: Family Medicine

## 2021-12-29 VITALS — BP 120/80 | HR 77 | Temp 97.6°F | Ht 68.0 in | Wt 171.9 lb

## 2021-12-29 DIAGNOSIS — R531 Weakness: Secondary | ICD-10-CM | POA: Diagnosis not present

## 2021-12-29 DIAGNOSIS — R5381 Other malaise: Secondary | ICD-10-CM

## 2021-12-29 DIAGNOSIS — M1A9XX0 Chronic gout, unspecified, without tophus (tophi): Secondary | ICD-10-CM

## 2021-12-29 DIAGNOSIS — E039 Hypothyroidism, unspecified: Secondary | ICD-10-CM | POA: Diagnosis not present

## 2021-12-29 DIAGNOSIS — E785 Hyperlipidemia, unspecified: Secondary | ICD-10-CM | POA: Diagnosis not present

## 2021-12-29 DIAGNOSIS — N183 Chronic kidney disease, stage 3 unspecified: Secondary | ICD-10-CM

## 2021-12-29 DIAGNOSIS — E538 Deficiency of other specified B group vitamins: Secondary | ICD-10-CM | POA: Diagnosis not present

## 2021-12-29 DIAGNOSIS — Z23 Encounter for immunization: Secondary | ICD-10-CM | POA: Diagnosis not present

## 2021-12-29 DIAGNOSIS — E559 Vitamin D deficiency, unspecified: Secondary | ICD-10-CM

## 2021-12-29 DIAGNOSIS — D72829 Elevated white blood cell count, unspecified: Secondary | ICD-10-CM

## 2021-12-29 DIAGNOSIS — I10 Essential (primary) hypertension: Secondary | ICD-10-CM

## 2021-12-29 NOTE — Progress Notes (Signed)
[name] DOB: Mar 12, 1931 Encounter date: 12/29/2021  This is a 88 y.o. male who presents with Chief Complaint  Patient presents with   Follow-up    History of present illness: Last visit with me is 11/16/2020.  He saw Dr. Volanda Napoleon in February for right leg wound.  As well as pain in the right eye.  He was treated for cellulitis with amoxicillin.  Instructed to use warm compresses for stye and given Polytrim solution for conjunctivitis.  Very sedentary. Does get winded with exertion. No significant cough.   Follows with rheumatology for osteoporosis and gout: on fosamax '70mg'$  weekly.   Follows with cardiology for history of AAA repair in 2002, coronary artery disease, systolic and diastolic heart failure, ICM status post ICD 2009, hypertension, hyperlipidemia.  Last visit with Dr. Caryl Comes was 08/16/2021.  Carvedilol was stopped due to hypotension. Most of home reads with bp are 130/90. Taking lasix daily. Taking midodrine TID. On pravastatin '40mg'$  daily.    COPD: Oxygen dependent respiratory failure.  Oxygen use just at night. This is still true. Not using during day. Uses pulmicort, formoterol BID and yupelri daily.   Hypothyroid:synthroid 155mg daily.  Here today with daughter. He did fall other day. Daughter was here to help pick him up. She feels like this unsteadiness is getting worse, more short winded with walking. Describing some sad mood to daughter - but states that this comes and goes. Daughter here every 1-2 months and she does note difference this time. Denies dizziness, does state light headed. Will state that he needs to hold on to something to prevent fall. Bp was often very low in past when this happened; has been better this week. Doesn't feel off balance. Just feels like legs can't hold him. Does exercise regularly - holds at kitchen sink and does leg lifts in all directions. Does arm lifts with hand weights. Does balancing exercises with hands on both counters.    Appetite is down a little. States that mood is a little sad.    Allergies  Allergen Reactions   Lisinopril     BP dropped too low per daughter   Current Meds  Medication Sig   alendronate (FOSAMAX) 70 MG tablet Take 1 tablet (70 mg total) by mouth once a week. Take 30 minutes before first meal and do not lie down for 30 minutes after taking.   allopurinol (ZYLOPRIM) 100 MG tablet TAKE 1 TABLET ONCE DAILY.   amiodarone (PACERONE) 200 MG tablet TAKE 1 TABLET ('200MG'$ ) BY MOUTH 5 DAYS PER WEEK.   aspirin EC 81 MG tablet Take 81 mg by mouth daily.   budesonide (PULMICORT) 0.5 MG/2ML nebulizer solution Take 2 mLs (0.5 mg total) by nebulization 2 (two) times daily.   Cholecalciferol (VITAMIN D) 50 MCG (2000 UT) CAPS Take 2,000 Units by mouth daily.   formoterol (PERFOROMIST) 20 MCG/2ML nebulizer solution Take 2 mLs (20 mcg total) by nebulization 2 (two) times daily.   furosemide (LASIX) 20 MG tablet TAKE 1 TABLET ONCE DAILY.   guaiFENesin (MUCINEX) 600 MG 12 hr tablet Take 600 mg by mouth in the morning, at noon, in the evening, and at bedtime.    levothyroxine (SYNTHROID) 125 MCG tablet TAKE ONE TABLET BY MOUTH ONCE DAILY   magnesium oxide (MAG-OX) 400 (241.3 Mg) MG tablet Take 0.5 tablets (200 mg total) by mouth daily.   midodrine (PROAMATINE) 2.5 MG tablet TAKE 1 TABLET 3 TIMES DAILY WITH MEALS.   Multiple Vitamins-Minerals (PRESERVISION AREDS 2 PO) Take  1 capsule by mouth daily.   mupirocin ointment (BACTROBAN) 2 % Apply topically as needed.   nystatin (MYCOSTATIN) 100000 UNIT/ML suspension Take 100,000 Units by mouth in the morning, at noon, in the evening, and at bedtime.   oxybutynin (DITROPAN) 5 MG tablet TAKE ONE TABLET BY MOUTH TWICE DAILY   polyethylene glycol (MIRALAX / GLYCOLAX) 17 g packet Take 17 g by mouth daily.   pravastatin (PRAVACHOL) 40 MG tablet TAKE 1 TABLET ONCE DAILY.   revefenacin (YUPELRI) 175 MCG/3ML nebulizer solution Take 3 mLs (175 mcg total) by nebulization  daily.   sertraline (ZOLOFT) 50 MG tablet TAKE ONE TABLET AT BEDTIME   thiamine 100 MG tablet Take 1 tablet (100 mg total) by mouth daily.    Review of Systems  Constitutional:  Negative for chills, fatigue and fever.  Respiratory:  Negative for cough, chest tightness, shortness of breath and wheezing.   Cardiovascular:  Negative for chest pain, palpitations and leg swelling.  Gastrointestinal:  Negative for abdominal pain.  Neurological:  Positive for weakness and light-headedness (sometimes). Negative for dizziness and headaches.   Objective:  BP 120/80 (BP Location: Left Arm, Patient Position: Sitting, Cuff Size: Normal)    Pulse 77    Temp 97.6 F (36.4 C) (Oral)    Ht '5\' 8"'$  (1.727 m)    Wt 171 lb 14.4 oz (78 kg)    SpO2 98%    BMI 26.14 kg/m   Weight: 171 lb 14.4 oz (78 kg)   BP Readings from Last 3 Encounters:  12/29/21 120/80  12/08/21 126/80  08/16/21 (!) 87/44   Wt Readings from Last 3 Encounters:  12/29/21 171 lb 14.4 oz (78 kg)  12/08/21 172 lb 3.2 oz (78.1 kg)  08/16/21 178 lb 6.4 oz (80.9 kg)    Physical Exam Constitutional:      General: He is not in acute distress.    Appearance: He is well-developed.  HENT:     Head: Normocephalic and atraumatic.     Right Ear: External ear normal.     Left Ear: External ear normal.     Nose: Nose normal.     Mouth/Throat:     Pharynx: No oropharyngeal exudate.  Eyes:     Conjunctiva/sclera: Conjunctivae normal.     Pupils: Pupils are equal, round, and reactive to light.  Neck:     Thyroid: No thyromegaly.  Cardiovascular:     Rate and Rhythm: Normal rate and regular rhythm.     Heart sounds: Normal heart sounds. No murmur heard.   No friction rub. No gallop.  Pulmonary:     Effort: Pulmonary effort is normal. No respiratory distress.     Breath sounds: Normal breath sounds. No stridor. No wheezing or rales.  Abdominal:     General: Bowel sounds are normal.     Palpations: Abdomen is soft.  Musculoskeletal:         General: Normal range of motion.     Cervical back: Neck supple.  Skin:    General: Skin is warm and dry.     Comments: Skin is dry, scaling.  He has multiple seborrheic and actinic keratoses.  He does follow with dermatology, but has recently declined any biopsies of skin.  Neurological:     Mental Status: He is alert and oriented to person, place, and time.     Comments: 4+ out of 5 strength in all extremities bilaterally.  Gait is slightly wide-based with cane and slow.  Psychiatric:  Behavior: Behavior normal.        Thought Content: Thought content normal.        Judgment: Judgment normal.    Assessment/Plan  1. Weakness Patient is not interested in physical therapy, but encouraged him to consider this.  We discussed importance of eating healthy foods with some protein in each meal so that exercises will be more beneficial for him.  If willing, I do think that physical therapy would be a great idea.  2. Essential hypertension Blood pressures well controlled and not getting hypotensive as he was in the past.  He continues to follow with cardiology.  Continue with current medications. - CBC with Differential/Platelet; Future - Comprehensive metabolic panel; Future - Comprehensive metabolic panel - CBC with Differential/Platelet  3. Stage 3 chronic kidney disease, unspecified whether stage 3a or 3b CKD ([place]) Recheck blood work today.  4. Vitamin D deficiency disease - VITAMIN D 25 Hydroxy (Vit-D Deficiency, Fractures); Future - VITAMIN D 25 Hydroxy (Vit-D Deficiency, Fractures)  5. Physical deconditioning See above.  6. Dyslipidemia Pravastatin 40 mg daily. - Lipid panel; Future - Lipid panel  7. Chronic gout without tophus, unspecified cause, unspecified site Allopurinol 100 mg daily - Uric acid; Future - Uric acid  8. Hypothyroidism, unspecified type Synthroid 125 mcg daily - TSH; Future - TSH  9. B12 deficiency - Folate; Future - Vitamin B12;  Future - Vitamin B12 - Folate  10. Need for prophylactic vaccination against Streptococcus pneumoniae (pneumococcus) - Pneumococcal conjugate vaccine 20-valent (Prevnar 20)  Return for pending lab or imaging results. 50 minutes spent in chart review, time with patient, discussion of chronic conditions, discussion of follow-up treatment plan.  We did discuss checking lab work first to evaluate for weakness.  If all lab work is normal, would consider making sure he is getting protein in his diet and consideration for physical therapy.  We also discussed mood.  May benefit from adjustment in mood medication to help with sad mood.    Micheline Rough, MD

## 2021-12-29 NOTE — Patient Instructions (Addendum)
Check oxygen at home while active. If oxygen level is dropping to 88 or below, consider trying walking around with oxygen and monitoring to see if this helps with shortness of breath.  ? ?*work on getting protein in diet with each meal. If meal is not full, consider meal replacement - protein supplement between meals.  ? ?*consider physical therapy :) I can order this at any time.  ?

## 2021-12-30 LAB — CBC WITH DIFFERENTIAL/PLATELET
Basophils Absolute: 0.1 10*3/uL (ref 0.0–0.2)
Basos: 1 %
EOS (ABSOLUTE): 2.9 10*3/uL — ABNORMAL HIGH (ref 0.0–0.4)
Eos: 25 %
Hematocrit: 48.8 % (ref 37.5–51.0)
Hemoglobin: 16.7 g/dL (ref 13.0–17.7)
Immature Grans (Abs): 0 10*3/uL (ref 0.0–0.1)
Immature Granulocytes: 0 %
Lymphocytes Absolute: 2.1 10*3/uL (ref 0.7–3.1)
Lymphs: 18 %
MCH: 33.8 pg — ABNORMAL HIGH (ref 26.6–33.0)
MCHC: 34.2 g/dL (ref 31.5–35.7)
MCV: 99 fL — ABNORMAL HIGH (ref 79–97)
Monocytes Absolute: 0.7 10*3/uL (ref 0.1–0.9)
Monocytes: 6 %
Neutrophils Absolute: 5.8 10*3/uL (ref 1.4–7.0)
Neutrophils: 50 %
Platelets: 128 10*3/uL — ABNORMAL LOW (ref 150–450)
RBC: 4.94 x10E6/uL (ref 4.14–5.80)
RDW: 12.8 % (ref 11.6–15.4)
WBC: 11.7 10*3/uL — ABNORMAL HIGH (ref 3.4–10.8)

## 2021-12-30 LAB — FOLATE: Folate: 12.2 ng/mL (ref >3.0)

## 2021-12-30 LAB — COMPREHENSIVE METABOLIC PANEL
ALT: 14 IU/L (ref 0–44)
AST: 28 IU/L (ref 0–40)
Albumin/Globulin Ratio: 1.4 (ref 1.2–2.2)
Albumin: 4.7 g/dL — ABNORMAL HIGH (ref 3.6–4.6)
Alkaline Phosphatase: 121 IU/L (ref 44–121)
BUN/Creatinine Ratio: 15 (ref 10–24)
BUN: 21 mg/dL (ref 8–27)
Bilirubin Total: 0.5 mg/dL (ref 0.0–1.2)
CO2: 27 mmol/L (ref 20–29)
Calcium: 9.7 mg/dL (ref 8.6–10.2)
Chloride: 94 mmol/L — ABNORMAL LOW (ref 96–106)
Creatinine, Ser: 1.37 mg/dL — ABNORMAL HIGH (ref 0.76–1.27)
Globulin, Total: 3.3 g/dL (ref 1.5–4.5)
Glucose: 82 mg/dL (ref 70–99)
Potassium: 4.2 mmol/L (ref 3.5–5.2)
Sodium: 138 mmol/L (ref 134–144)
Total Protein: 8 g/dL (ref 6.0–8.5)
eGFR: 50 mL/min/{1.73_m2} — ABNORMAL LOW (ref >59)

## 2021-12-30 LAB — VITAMIN B12: Vitamin B-12: 518 pg/mL (ref 232–1245)

## 2021-12-30 LAB — URIC ACID: Uric Acid: 6.1 mg/dL (ref 3.8–8.4)

## 2021-12-30 LAB — LIPID PANEL
Chol/HDL Ratio: 4.9 ratio (ref 0.0–5.0)
Cholesterol, Total: 186 mg/dL (ref 100–199)
HDL: 38 mg/dL — ABNORMAL LOW (ref >39)
LDL Chol Calc (NIH): 98 mg/dL (ref 0–99)
Triglycerides: 293 mg/dL — ABNORMAL HIGH (ref 0–149)
VLDL Cholesterol Cal: 50 mg/dL — ABNORMAL HIGH (ref 5–40)

## 2021-12-30 LAB — TSH: TSH: 4.05 u[IU]/mL (ref 0.450–4.500)

## 2021-12-30 LAB — VITAMIN D 25 HYDROXY (VIT D DEFICIENCY, FRACTURES): Vit D, 25-Hydroxy: 41.2 ng/mL (ref 30.0–100.0)

## 2022-01-01 ENCOUNTER — Telehealth: Payer: Self-pay | Admitting: *Deleted

## 2022-01-01 NOTE — Telephone Encounter (Signed)
Spoke with patient's daughter and advised we would like to schedule a sooner appointment to discuss Prolia and we would like for her to attend the appointment. Scheduled appointment with her for 01/17/2022.  ?

## 2022-01-01 NOTE — Progress Notes (Signed)
?  This was Dr. Arlean Hopping response when I reached out re bone density treatment. Just wanted patient/daughter to be aware. Looks like they will reach out to set up appointment. ? ? ?"Thank you for reaching out to me.  At the last visit he came alone and did not know his medications.  We were not even sure if he was taking Fosamax.  I advised him to return for a follow-up visit with his daughter so that we can discuss treatment options.  If he has been taking Fosamax since 2015 he definitely needs to stop it as there is no benefit after 5 years of use.  We will try to schedule an earlier appointment to discuss subcutaneous Prolia and advised him to be accompanied by his daughter." ? ?Patient wasn't sure if he was taking fosamax that long, but it has been prescribed for that long, so worth talking with rheumatology. ?

## 2022-01-01 NOTE — Telephone Encounter (Signed)
-----   Message from Bo Merino, MD sent at 12/31/2021  7:58 PM EDT ----- ?Thank you for reaching out to me.  At the last visit he came alone and did not know his medications.  We were not even sure if he was taking Fosamax.  I advised him to return for a follow-up visit with his daughter so that we can discuss treatment options.  If he has been taking Fosamax since 2015 he definitely needs to stop it as there is no benefit after 5 years of use.  We will try to schedule an earlier appointment to discuss subcutaneous Prolia and advised him to be accompanied by his daughter. ?SD ?----- Message ----- ?From: Caren Macadam, MD ?Sent: 12/29/2021   5:50 PM EDT ?To: Bo Merino, MD ? ?Hi - I was seeing patient in the office today. I saw a note on the 09/2020 dexa that you were considering changing to prolia from fosamax at next visit, but didn't see that it was changed. I had just been asking him about it and he wasn't sure. I believe he has been on fosamax now since at least 2015. Just wanted to check as I discussed with daughter. ? ?

## 2022-01-02 ENCOUNTER — Telehealth: Payer: Self-pay | Admitting: *Deleted

## 2022-01-02 NOTE — Telephone Encounter (Signed)
-----   Message from Caren Macadam, MD sent at 01/01/2022  7:49 PM EDT ----- ? ? ? ?----- Message ----- ?From: Bo Merino, MD ?Sent: 12/31/2021   8:00 PM EDT ?To: Caren Macadam, MD, Carole Binning, LPN ? ?Thank you for reaching out to me.  At the last visit he came alone and did not know his medications.  We were not even sure if he was taking Fosamax.  I advised him to return for a follow-up visit with his daughter so that we can discuss treatment options.  If he has been taking Fosamax since 2015 he definitely needs to stop it as there is no benefit after 5 years of use.  We will try to schedule an earlier appointment to discuss subcutaneous Prolia and advised him to be accompanied by his daughter. ?SD ?----- Message ----- ?From: Caren Macadam, MD ?Sent: 12/29/2021   5:50 PM EDT ?To: Bo Merino, MD ? ?Hi - I was seeing patient in the office today. I saw a note on the 09/2020 dexa that you were considering changing to prolia from fosamax at next visit, but didn't see that it was changed. I had just been asking him about it and he wasn't sure. I believe he has been on fosamax now since at least 2015. Just wanted to check as I discussed with daughter. ? ?

## 2022-01-02 NOTE — Telephone Encounter (Signed)
Spoke with [name], the patient's daughter and informed her of the message below.   ?

## 2022-01-04 NOTE — Progress Notes (Signed)
? ?Office Visit Note ? ?Patient: [name]             ?Date of Birth: 10-06-1931           ?MRN: [id number]             ?PCP: Caren Macadam, MD ?Referring: Caren Macadam, MD ?Visit Date: 01/17/2022 ?Occupation: '@GUAROCC'$ @ ? ?Subjective:  ?Discuss switching to prolia ? ?History of Present Illness: [name] is a 88 y.o. male with history of osteoporosis and gout.  Patient presents today accompanied by his daughter to discuss switching from Fosamax to Prolia.  He has been on Fosamax for about 5 years and has been tolerating it without any side effects.  He continues to have frequent falls and lower extremity muscle deconditioning.  His PCP offered a referral to physical therapy but he declined at this time and has been trying to perform home exercises.  He uses a walker at home to assist with ambulation and a cane while out of the house.  He has not been taking a calcium and vitamin D supplement recently.  He has upcoming dental work with at least 2 tooth extractions.  His dentist recommended a 36-monthbefore administering Prolia after the dental extractions are performed. ?He denies any increased joint pain or joint swelling at this time.  He has not had any gout flares.  He remains on allopurinol 100 mg daily. ? ? ?Activities of Daily Living:  ?Patient reports morning stiffness for 0  none .   ?Patient Denies nocturnal pain.  ?Difficulty dressing/grooming: Denies ?Difficulty climbing stairs: Reports ?Difficulty getting out of chair: Reports ?Difficulty using hands for taps, buttons, cutlery, and/or writing: Reports ? ?Review of Systems  ?Constitutional:  Positive for fatigue. Negative for night sweats.  ?HENT:  Positive for mouth dryness. Negative for mouth sores and nose dryness.   ?Eyes:  Negative for redness and dryness.  ?Respiratory:  Negative for difficulty breathing.   ?Cardiovascular:  Positive for swelling in legs/feet. Negative for chest pain, palpitations, hypertension and  irregular heartbeat.  ?Gastrointestinal:  Positive for constipation. Negative for diarrhea.  ?Endocrine: Positive for excessive thirst and increased urination.  ?Genitourinary:  Negative for difficulty urinating and painful urination.  ?Musculoskeletal:  Positive for gait problem and muscle weakness. Negative for joint pain, joint pain, joint swelling, myalgias, morning stiffness, muscle tenderness and myalgias.  ?Skin:  Negative for color change, rash, hair loss, nodules/bumps, skin tightness, ulcers and sensitivity to sunlight.  ?Allergic/Immunologic: Negative for susceptible to infections.  ?Neurological:  Positive for weakness. Negative for dizziness, fainting, memory loss and night sweats.  ?Hematological:  Positive for bruising/bleeding tendency. Negative for swollen glands.  ?Psychiatric/Behavioral:  Negative for depressed mood and sleep disturbance. The patient is not nervous/anxious.   ? ?PMFS History:  ?Patient Active Problem List  ? Diagnosis Date Noted  ? Coronary artery disease involving native coronary artery of native heart without angina pectoris 09/30/2019  ? Dyslipidemia 09/30/2019  ? Educated about COVID-19 virus infection 03/20/2019  ? VT (ventricular tachycardia) 03/20/2019  ? Chronic hypoxemic respiratory failure ([facility] 08/26/2018  ? Gait abnormality 08/26/2018  ? Lower GI bleeding 07/21/2018  ? Forehead laceration, initial encounter 07/21/2018  ? CKD (chronic kidney disease) stage 3, GFR 30-59 ml/min (HCC) 07/21/2018  ? History of pneumonia 07/15/2018  ? Physical deconditioning 11/13/2017  ? Weakness 11/12/2017  ? Falls 11/12/2017  ? Pleural effusion   ? CHF exacerbation ([facility] 09/30/2017  ? Shingles outbreak 05/27/2017  ? Seborrheic  dermatitis 05/27/2017  ? Acute on chronic respiratory failure with hypoxia ([place]) 01/13/2017  ? COPD with acute bronchitis ([place]) 01/13/2017  ? CAD S/P percutaneous coronary angioplasty   ? Chronic combined systolic and diastolic CHF (congestive heart failure) ([place])    ? Blunt chest trauma 05/04/2016  ? 4.1 cm Aneurysm of thoracic aorta ([place]) 04/27/2016  ? SIADH (syndrome of inappropriate ADH production) ([place])   ? Orthostatic hypotension 04/12/2016  ? TBI (traumatic brain injury)   ? Acute on chronic combined systolic and diastolic congestive heart failure ([place])   ? S/P ORIF (open reduction internal fixation) fracture   ? Thrombocytopenia ([place])   ? Urinary retention   ? Slow transit constipation   ? Thyroid activity decreased   ? Traumatic subdural hematoma with loss of consciousness ([place])   ? Pulmonary hypertension ()   ? Cardiomyopathy, ischemic   ? Subdural hematoma   ? Cardiac arrest ([place]) 03/29/2016  ? 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 T wave over sensing resulting in inappropriate shocks 08/14/2013  ? Vitamin D deficiency disease 04/09/2012  ? Actinic skin damage 05/22/2011  ? Alcohol use 03/22/2011  ? Other dysphagia 02/16/2011  ? Carotid arterial disease ([place]) 11/17/2009  ? GOUT 09/23/2009  ? Age-related osteoporosis with current pathological fracture 09/23/2009  ? SYNCOPE 04/19/2009  ? Automatic implantable cardioverter-defibrillator in situ 04/13/2008  ? COLONIC POLYPS 12/09/2007  ? Diverticulosis of colon 12/09/2007  ? Essential hypertension 12/08/2007  ? Peripheral vascular disease ([place]) 12/08/2007  ? COPD GOLD III 12/08/2007  ? Osteoarthritis 12/08/2007  ?  ?Past Medical History:  ?Diagnosis Date  ? AAA (abdominal aortic aneurysm)   ? a. 2002 s/p repair.  ? Allergic rhinitis   ? Back pain   ? CAD (coronary artery disease)   ? a. 02/2002 H/o MI with stenting x 2; b. 04/2013 MV: EF 25%, large posterior lateral infarct w/o ischemia; c. 03/2016 VT Arrest/Cath: LM 60ost, LAD 40p/m ISR, LCX 100p/m, RCA nl-->Med Rx.  ? Carotid arterial disease ([place])   ? a. 12/2010 s/p R CEA;  b. 05/2015 Carotid U/S: bilat <40% ICA stenosis.  ? Chronic combined systolic and diastolic CHF (congestive heart failure) ([place])   ? a. 03/2016 Echo: Ef 35-40%, grade 1 DD.  ?  Compression fracture   ? COPD (chronic obstructive pulmonary disease) ([place])   ? Dermatitis   ? Diverticulosis of colon   ? DJD (degenerative joint disease)   ? and Gout  ? Hemorrhoids   ? History of colonic polyps   ? History of gout   ? History of pneumonia   ? Hypercholesterolemia   ? Hypertension   ? Hypertensive heart disease   ? Ischemic cardiomyopathy   ? a. EF prev <35%-->improved to normal by Echo 8/14:  Mild LVH, focal basal hypertrophy, EF 60-65%, normal wall motion, mild BAE, PASP 36; c. 03/2016 Echo: EF 35-40%, Gr1 DD, triv AI, mild MR, mod dil LA, mild-mod TR, PASP 26mHg.  ? Osteoporosis   ? Peripheral vascular disease ([place]   ? Presence of cardiac defibrillator   ? a. 03/2008 s/p MDT [id number] Maximo II DR, DC AICD; b. 03/2013: ICD shock for T wave oversensing;  c. 10/2015: collective decision not to replace ICD given improvement in LV fxn; d. VT Arrest-->Gen change to MDT ser # [id number].  ? Skin cancer   ? shoulders and forehead  ? Syncope   ? T wave over sensing resulting in inappropriate shocks   ? a.  03/2013.  ? Tobacco abuse   ? Ventricular tachycardia 04/01/2016  ?  ?Family History  ?Problem Relation Age of Onset  ? Hypertension Father   ? Heart disease Father 63  ?     Heart Disease before age 3  ? Alcohol abuse Father   ?     causing multiple medical problems; uncertain exact cause of death  ? Hypertension Mother   ? Heart disease Mother   ?     Heart Disease before age 61  ? Cancer Mother   ?     pancreatic  ? Scoliosis Sister   ? Thyroid disease Daughter   ? Anxiety disorder Son   ? ?Past Surgical History:  ?Procedure Laterality Date  ? ABDOMINAL AORTIC ANEURYSM REPAIR  2002  ? by Dr. Kellie Simmering  ? aicd placed  03/2008  ? Dr. Caryl Comes  ? cad stent  02/2002  ? Dr. Percival Spanish  ? CARDIAC CATHETERIZATION    ? X 2 stents  ? CARDIAC CATHETERIZATION N/A 04/03/2016  ? Procedure: Left Heart Cath and Coronary Angiography;  Surgeon: Belva Crome, MD;  Location: [facility];  Service: Cardiovascular;   Laterality: N/A;  ? CARDIAC DEFIBRILLATOR PLACEMENT  2009  ? CARDIAC DEFIBRILLATOR PLACEMENT    ? CAROTID ENDARTERECTOMY Right January 02, 2011  ? EAR CYST EXCISION Left 12/13/2015  ? Procedure: Excision left ear lesion ;  Su

## 2022-01-09 NOTE — Addendum Note (Signed)
Addended by: Agnes Lawrence on: 01/09/2022 08:53 AM ? ? Modules accepted: Orders ? ?

## 2022-01-15 ENCOUNTER — Ambulatory Visit (INDEPENDENT_AMBULATORY_CARE_PROVIDER_SITE_OTHER): Payer: Medicare Other

## 2022-01-15 DIAGNOSIS — I5042 Chronic combined systolic (congestive) and diastolic (congestive) heart failure: Secondary | ICD-10-CM | POA: Diagnosis not present

## 2022-01-15 DIAGNOSIS — Z95810 Presence of automatic (implantable) cardiac defibrillator: Secondary | ICD-10-CM

## 2022-01-17 ENCOUNTER — Telehealth: Payer: Self-pay | Admitting: Pharmacist

## 2022-01-17 ENCOUNTER — Ambulatory Visit (INDEPENDENT_AMBULATORY_CARE_PROVIDER_SITE_OTHER): Payer: Medicare Other | Admitting: Physician Assistant

## 2022-01-17 ENCOUNTER — Other Ambulatory Visit (HOSPITAL_COMMUNITY): Payer: Self-pay

## 2022-01-17 ENCOUNTER — Other Ambulatory Visit: Payer: Self-pay

## 2022-01-17 ENCOUNTER — Encounter: Payer: Self-pay | Admitting: Physician Assistant

## 2022-01-17 VITALS — BP 122/69 | HR 80 | Resp 16 | Ht 71.0 in | Wt 171.0 lb

## 2022-01-17 DIAGNOSIS — Z5181 Encounter for therapeutic drug level monitoring: Secondary | ICD-10-CM

## 2022-01-17 DIAGNOSIS — M8000XD Age-related osteoporosis with current pathological fracture, unspecified site, subsequent encounter for fracture with routine healing: Secondary | ICD-10-CM

## 2022-01-17 DIAGNOSIS — I2510 Atherosclerotic heart disease of native coronary artery without angina pectoris: Secondary | ICD-10-CM

## 2022-01-17 DIAGNOSIS — Z8709 Personal history of other diseases of the respiratory system: Secondary | ICD-10-CM | POA: Diagnosis not present

## 2022-01-17 DIAGNOSIS — I5042 Chronic combined systolic (congestive) and diastolic (congestive) heart failure: Secondary | ICD-10-CM

## 2022-01-17 DIAGNOSIS — I739 Peripheral vascular disease, unspecified: Secondary | ICD-10-CM

## 2022-01-17 DIAGNOSIS — J449 Chronic obstructive pulmonary disease, unspecified: Secondary | ICD-10-CM | POA: Diagnosis not present

## 2022-01-17 DIAGNOSIS — S32030S Wedge compression fracture of third lumbar vertebra, sequela: Secondary | ICD-10-CM

## 2022-01-17 DIAGNOSIS — Z95810 Presence of automatic (implantable) cardiac defibrillator: Secondary | ICD-10-CM | POA: Diagnosis not present

## 2022-01-17 DIAGNOSIS — I255 Ischemic cardiomyopathy: Secondary | ICD-10-CM

## 2022-01-17 DIAGNOSIS — I272 Pulmonary hypertension, unspecified: Secondary | ICD-10-CM

## 2022-01-17 DIAGNOSIS — Z9861 Coronary angioplasty status: Secondary | ICD-10-CM

## 2022-01-17 DIAGNOSIS — M1A09X0 Idiopathic chronic gout, multiple sites, without tophus (tophi): Secondary | ICD-10-CM

## 2022-01-17 DIAGNOSIS — Z8719 Personal history of other diseases of the digestive system: Secondary | ICD-10-CM | POA: Diagnosis not present

## 2022-01-17 DIAGNOSIS — Z8679 Personal history of other diseases of the circulatory system: Secondary | ICD-10-CM

## 2022-01-17 DIAGNOSIS — E559 Vitamin D deficiency, unspecified: Secondary | ICD-10-CM

## 2022-01-17 DIAGNOSIS — I10 Essential (primary) hypertension: Secondary | ICD-10-CM

## 2022-01-17 DIAGNOSIS — E222 Syndrome of inappropriate secretion of antidiuretic hormone: Secondary | ICD-10-CM | POA: Diagnosis not present

## 2022-01-17 DIAGNOSIS — Z87891 Personal history of nicotine dependence: Secondary | ICD-10-CM

## 2022-01-17 DIAGNOSIS — Z87820 Personal history of traumatic brain injury: Secondary | ICD-10-CM

## 2022-01-17 DIAGNOSIS — C44219 Basal cell carcinoma of skin of left ear and external auricular canal: Secondary | ICD-10-CM

## 2022-01-17 NOTE — Patient Instructions (Signed)
Denosumab injection ?What is this medication? ?DENOSUMAB (den oh sue mab) slows bone breakdown. Prolia is used to treat osteoporosis in women after menopause and in men, and in people who are taking corticosteroids for 6 months or more. Xgeva is used to treat a high calcium level due to cancer and to prevent bone fractures and other bone problems caused by multiple myeloma or cancer bone metastases. Xgeva is also used to treat giant cell tumor of the bone. ?This medicine may be used for other purposes; ask your health care provider or pharmacist if you have questions. ?COMMON BRAND NAME(S): Prolia, XGEVA ?What should I tell my care team before I take this medication? ?They need to know if you have any of these conditions: ?dental disease ?having surgery or tooth extraction ?infection ?kidney disease ?low levels of calcium or Vitamin D in the blood ?malnutrition ?on hemodialysis ?skin conditions or sensitivity ?thyroid or parathyroid disease ?an unusual reaction to denosumab, other medicines, foods, dyes, or preservatives ?pregnant or trying to get pregnant ?breast-feeding ?How should I use this medication? ?This medicine is for injection under the skin. It is given by a health care professional in a hospital or clinic setting. ?A special MedGuide will be given to you before each treatment. Be sure to read this information carefully each time. ?For Prolia, talk to your pediatrician regarding the use of this medicine in children. Special care may be needed. For Xgeva, talk to your pediatrician regarding the use of this medicine in children. While this drug may be prescribed for children as young as 13 years for selected conditions, precautions do apply. ?Overdosage: If you think you have taken too much of this medicine contact a poison control center or emergency room at once. ?NOTE: This medicine is only for you. Do not share this medicine with others. ?What if I miss a dose? ?It is important not to miss your dose.  Call your doctor or health care professional if you are unable to keep an appointment. ?What may interact with this medication? ?Do not take this medicine with any of the following medications: ?other medicines containing denosumab ?This medicine may also interact with the following medications: ?medicines that lower your chance of fighting infection ?steroid medicines like prednisone or cortisone ?This list may not describe all possible interactions. Give your health care provider a list of all the medicines, herbs, non-prescription drugs, or dietary supplements you use. Also tell them if you smoke, drink alcohol, or use illegal drugs. Some items may interact with your medicine. ?What should I watch for while using this medication? ?Visit your doctor or health care professional for regular checks on your progress. Your doctor or health care professional may order blood tests and other tests to see how you are doing. ?Call your doctor or health care professional for advice if you get a fever, chills or sore throat, or other symptoms of a cold or flu. Do not treat yourself. This drug may decrease your body's ability to fight infection. Try to avoid being around people who are sick. ?You should make sure you get enough calcium and vitamin D while you are taking this medicine, unless your doctor tells you not to. Discuss the foods you eat and the vitamins you take with your health care professional. ?See your dentist regularly. Brush and floss your teeth as directed. Before you have any dental work done, tell your dentist you are receiving this medicine. ?Do not become pregnant while taking this medicine or for 5 months after   stopping it. Talk with your doctor or health care professional about your birth control options while taking this medicine. Women should inform their doctor if they wish to become pregnant or think they might be pregnant. There is a potential for serious side effects to an unborn child. Talk to  your health care professional or pharmacist for more information. ?What side effects may I notice from receiving this medication? ?Side effects that you should report to your doctor or health care professional as soon as possible: ?allergic reactions like skin rash, itching or hives, swelling of the face, lips, or tongue ?bone pain ?breathing problems ?dizziness ?jaw pain, especially after dental work ?redness, blistering, peeling of the skin ?signs and symptoms of infection like fever or chills; cough; sore throat; pain or trouble passing urine ?signs of low calcium like fast heartbeat, muscle cramps or muscle pain; pain, tingling, numbness in the hands or feet; seizures ?unusual bleeding or bruising ?unusually weak or tired ?Side effects that usually do not require medical attention (report to your doctor or health care professional if they continue or are bothersome): ?constipation ?diarrhea ?headache ?joint pain ?loss of appetite ?muscle pain ?runny nose ?tiredness ?upset stomach ?This list may not describe all possible side effects. Call your doctor for medical advice about side effects. You may report side effects to FDA at 1-800-FDA-1088. ?Where should I keep my medication? ?This medicine is only given in a clinic, doctor's office, or other health care setting and will not be stored at home. ?NOTE: This sheet is a summary. It may not cover all possible information. If you have questions about this medicine, talk to your doctor, pharmacist, or health care provider. ?? 2022 Elsevier/Gold Standard (2018-02-14 00:00:00) ? ?

## 2022-01-17 NOTE — Telephone Encounter (Signed)
Received notification from  [facility]  regarding a prior authorization for [place] through  pharmacy benefit. Authorization has been APPROVED from 01/17/22 to 01/18/23.  ? ?Unable to run test claim - prior auth not yet adjudicated in pharmacy system. Will attempt test claim later. ? ?Please investigate medical benefits for Prolia [phone] through Medicare A/B + BCBS supplement ? ?Knox Saliva, PharmD, MPH, BCPS ?Clinical Pharmacist (Rheumatology and Pulmonology) ? ?

## 2022-01-17 NOTE — Progress Notes (Signed)
Pharmacy Note ? ?Subjective:  ?Patient presents today to [facility] for follow up office visit.   Patient was seen by the pharmacist for counseling on Prolia. He has been on alendronate since 2015 - completed course ? ?Objective: ?CMP  ?   ?Component Value Date/Time  ? NA 138 12/29/2021 1547  ? K 4.2 12/29/2021 1547  ? CL 94 (L) 12/29/2021 1547  ? CO2 27 12/29/2021 1547  ? GLUCOSE 82 12/29/2021 1547  ? GLUCOSE 71 07/11/2021 1355  ? GLUCOSE 93 11/01/2006 0750  ? BUN 21 12/29/2021 1547  ? CREATININE 1.37 (H) 12/29/2021 1547  ? CREATININE 1.47 (H) 06/20/2021 1442  ? CALCIUM 9.7 12/29/2021 1547  ? PROT 8.0 12/29/2021 1547  ? ALBUMIN 4.7 (H) 12/29/2021 1547  ? AST 28 12/29/2021 1547  ? ALT 14 12/29/2021 1547  ? ALKPHOS 121 12/29/2021 1547  ? BILITOT 0.5 12/29/2021 1547  ? GFRNONAA 55 (L) 11/28/2020 1127  ? GFRNONAA 52 (L) 09/20/2020 1619  ? GFRAA 61 09/20/2020 1619  ? ? ?Vitamin D ?Lab Results  ?Component Value Date  ? VD25OH 41.2 12/29/2021  ? ? ?DEXA on 02/17/2018 right femoral neck T score -2.7 with an increase of 11.4% when compared to previous DEXA ? ?Assessment/Plan:  ?Counseled patient on purpose, proper use, and adverse effects of Prolia.  Counseled patient that Prolia is a medication that must be injected every 6 months by a healthcare professional.  Advised patient to take calcium 1200 mg daily and vitamin D 800 units daily.  Reviewed the most common adverse effects of Prolia including risk of infection, osteonecrosis of the jaw, rash, and muscle/bone pain.  Patient confirms she does not have any major dental work planned at this time.  Reviewed with patient the signs/symptoms of low calcium and advised patient to alert [place] if she experiences these symptoms.  Provided patient with medication education material and answered all questions. Will apply for Prolia through patient's insurance. ? ?He currently takes vitamin D 1000 IU daily. Does not take calcium supplement. Patient  ? ?Patient is having  dental work completed and his daughter will be scheduling dental procedure and notifying our clinic when he can start Prolia. They are aware we will need updated lab work prior to starting Prolia. ? ?Patient has Medicare + BCBS supplement so will be most cost-effective option for patient. Pharmacy team will investigate both pharmacy and medical benefit. ?  ?Knox Saliva, PharmD, MPH, BCPS ?Clinical Pharmacist (Rheumatology and Pulmonology) ?

## 2022-01-17 NOTE — Telephone Encounter (Addendum)
Patient had OV today with Hazel Sams, PA-C. Plan is to transition patient from alendronate to Prolia. ? ?Please investigate medical benefits for Prolia - [id number], [id number]. Patient has Medicare + BCBS supplement ? ?Pharmacy benefits - prior authorization submitted to [facility] via CMM with signed OV note ? ?Key: [id number] ? ?Knox Saliva, PharmD, MPH, BCPS ?Clinical Pharmacist (Rheumatology and Pulmonology) ?

## 2022-01-18 ENCOUNTER — Telehealth: Payer: Self-pay

## 2022-01-18 NOTE — Telephone Encounter (Signed)
Patient was advised to discontinue Fosamax at his last office visit yesterday.  He is waiting to initiate Prolia until cleared by oral surgery status post dental extractions.  We will be waiting on clearance after the procedure to initiate Prolia.

## 2022-01-18 NOTE — Telephone Encounter (Signed)
Patient's daughter [name] called stating [name] met with the oral surgeon yesterday, 3/29 and was told he needed clearance from Dr. Estanislado Pandy before they will schedule his tooth extractions.  She told their office he hasn't started the Prolia, but was told without clearance from Dr. Estanislado Pandy he couldn't schedule. ?Phone [phone]  ?Fax [phone]  ? ?

## 2022-01-19 ENCOUNTER — Encounter: Payer: Self-pay | Admitting: *Deleted

## 2022-01-19 NOTE — Telephone Encounter (Signed)
Letter written and faxed.

## 2022-01-19 NOTE — Progress Notes (Signed)
EPIC Encounter for ICM Monitoring ? ?Patient Name: [name] is a 88 y.o. male ?Date: 01/19/2022 ?Primary Care Physican: Caren Macadam, MD ?Primary Cardiologist: Hochrein ?Electrophysiologist: Caryl Comes ?12/08/2021 Office Weight:  172 lbs                                              ?  ?Transmission reviewed.  ?  ?Optivol thoracic impedance normal.  ?  ?Prescribed: ?Furosemide 20 mg 1 tablet daily.    ?  ?Labs: ?12/29/2021 Creatinine 1.37, BUN 21, Potassium 4.2, Sodium 138, GFR 50 ?07/11/2021 Creatinine 1.18, BUN 18, Potassium 4.1, Sodium 138, GFR 55.15 ?06/20/2021 Creatinine 1.47, BUN 22, Potassium 4.6, Sodium 138 ?11/28/2020 Creatinine 1.26, BUN 18, Potassium 4.3, Sodium 137, GFR 55 ?A complete set of results can be found in Results Review. ?  ?Recommendations:  No changes. ?  ?Follow-up plan: ICM clinic phone appointment on 02/19/2022.   91 day device clinic remote transmission 01/31/2022.   ?  ?EP/Cardiology Office Visits:   Recall 02/12/2022 with Dr. Caryl Comes. ?  ?Copy of ICM check sent to Dr. Caryl Comes.  ? ?3 month ICM trend: 01/15/2022. ? ? ? ?12-14 Month ICM trend:  ? ? ? ?[name], RN ?01/19/2022 ?1:36 PM ? ?

## 2022-01-21 ENCOUNTER — Other Ambulatory Visit: Payer: Self-pay | Admitting: Family Medicine

## 2022-01-31 ENCOUNTER — Ambulatory Visit (INDEPENDENT_AMBULATORY_CARE_PROVIDER_SITE_OTHER): Payer: Medicare Other

## 2022-01-31 DIAGNOSIS — I255 Ischemic cardiomyopathy: Secondary | ICD-10-CM | POA: Diagnosis not present

## 2022-01-31 LAB — CUP PACEART REMOTE DEVICE CHECK
Battery Remaining Longevity: 30 mo
Battery Voltage: 2.95 V
Brady Statistic AP VP Percent: 0.05 %
Brady Statistic AP VS Percent: 88.2 %
Brady Statistic AS VP Percent: 0.03 %
Brady Statistic AS VS Percent: 11.73 %
Brady Statistic RA Percent Paced: 88.2 %
Brady Statistic RV Percent Paced: 0.08 %
Date Time Interrogation Session: 20230412001703
HighPow Impedance: 46 Ohm
HighPow Impedance: 62 Ohm
Implantable Lead Implant Date: 20090623
Implantable Lead Implant Date: 20090623
Implantable Lead Location: 753859
Implantable Lead Location: 753860
Implantable Lead Model: 5076
Implantable Lead Model: 6947
Implantable Pulse Generator Implant Date: 20170616
Lead Channel Impedance Value: 304 Ohm
Lead Channel Impedance Value: 361 Ohm
Lead Channel Impedance Value: 418 Ohm
Lead Channel Pacing Threshold Amplitude: 0.875 V
Lead Channel Pacing Threshold Amplitude: 1.125 V
Lead Channel Pacing Threshold Pulse Width: 0.4 ms
Lead Channel Pacing Threshold Pulse Width: 0.4 ms
Lead Channel Sensing Intrinsic Amplitude: 0.875 mV
Lead Channel Sensing Intrinsic Amplitude: 0.875 mV
Lead Channel Sensing Intrinsic Amplitude: 7.625 mV
Lead Channel Sensing Intrinsic Amplitude: 7.625 mV
Lead Channel Setting Pacing Amplitude: 2.25 V
Lead Channel Setting Pacing Amplitude: 2.5 V
Lead Channel Setting Pacing Pulse Width: 0.4 ms
Lead Channel Setting Sensing Sensitivity: 0.6 mV

## 2022-02-01 ENCOUNTER — Other Ambulatory Visit (HOSPITAL_COMMUNITY): Payer: Self-pay

## 2022-02-01 NOTE — Telephone Encounter (Addendum)
Called [employer] which is active since 10/23/2007. Patient's plan covers Part A deductible/coinsurance and Part B coinsurance. Pre-certification is not required for Medicare patients - plan follows Medicare guidelines. Medicare covers 80% of cost. Patient would be responsible for 20% which the supplement would cover. There is no OOP maximum. The plan does not cover the $226 deductible - once deductible is met, supplement will cover cost. ? ?Ref #Lea A [id number] ? ?Phone: [phone] ? ?Per test claim, copay through pharmacy benefit is $480.56 ? ?After review of the above, patient's most cost-effective and affordable option will be for him to go to [facility] for Prolia. We are still awaiting clearance from his dental surgeon ? ?Knox Saliva, PharmD, MPH, BCPS ?Clinical Pharmacist (Rheumatology and Pulmonology) ?

## 2022-02-07 ENCOUNTER — Telehealth (INDEPENDENT_AMBULATORY_CARE_PROVIDER_SITE_OTHER): Payer: Medicare Other | Admitting: Family Medicine

## 2022-02-07 ENCOUNTER — Telehealth: Payer: Self-pay

## 2022-02-07 DIAGNOSIS — R2681 Unsteadiness on feet: Secondary | ICD-10-CM | POA: Diagnosis not present

## 2022-02-07 DIAGNOSIS — I255 Ischemic cardiomyopathy: Secondary | ICD-10-CM

## 2022-02-07 DIAGNOSIS — R32 Unspecified urinary incontinence: Secondary | ICD-10-CM | POA: Diagnosis not present

## 2022-02-07 LAB — POCT URINALYSIS DIPSTICK
Bilirubin, UA: NEGATIVE
Blood, UA: NEGATIVE
Glucose, UA: NEGATIVE
Ketones, UA: NEGATIVE
Leukocytes, UA: NEGATIVE
Nitrite, UA: NEGATIVE
Protein, UA: POSITIVE — AB
Spec Grav, UA: 1.015 (ref 1.010–1.025)
Urobilinogen, UA: NEGATIVE E.U./dL — AB
pH, UA: 6 (ref 5.0–8.0)

## 2022-02-07 NOTE — Telephone Encounter (Signed)
Looks like this was set up with dr. Volanda Napoleon today. I'll forward so she is aware of alternative number. ?

## 2022-02-07 NOTE — Telephone Encounter (Signed)
Amy from access nurse triage pt needs to be seen within 4 hours. Pt seems to be forgetfulness incontinence and gait issues. Pt did have some teeth extracted yesterday with some anesthesia. Triage nurse think this may be the cause. Pt daughter is anxious per nurse and wants to know if she can bring in a urine sample and have an appointment virtually due to not wanting pt to move. Daughter can be reached at [phone].  ?

## 2022-02-07 NOTE — Telephone Encounter (Signed)
Pt has been scheduled. Pt daugjter aware of update. Will call with an alternative number for VV.  ?

## 2022-02-07 NOTE — Progress Notes (Signed)
Virtual Visit via Video Note ?Visit initially started as video, however switched to phone call as audio and video were going in and out. ? ?I connected with [name]  on 02/07/22 at  4:15 PM EDT by a video enabled telemedicine application 2/2 [id number] pandemic and verified that I am speaking with the correct person using two identifiers. ? Location patient: home ?Location provider:work or home office ?Persons participating in the virtual visit: patient, provider, with his daughter. ? ?I discussed the limitations of evaluation and management by telemedicine and the availability of in person appointments. The patient expressed understanding and agreed to proceed. ? ? ?HPI: ?From Sunday 02/03/22, until today 02/07/22 pt has not been himself.  Pt didn't do his normal activities such as drink coffee or get the paper.  Stated he felt like "he was running on half a tank".  No fevers, chills, cough noted. ? ?Pt had two teeth extract on Tuesday 02/05/22, so had a bad day Wed.  Local anesthetic used. ? ?Pt having urinary freq- urinating on himself and at times not needing to urinate at all despite urge.  Pt unstable on his feet.  Came close to falling but caught himself on the edge of the table. ? ?Pt started taking Amoxicillin yesterday after his dental procedure.  He is to take it twice a day until Monday.   ? ?Also taking Tylenol for pain. ? ? ?ROS: See pertinent positives and negatives per HPI. ? ?Past Medical History:  ?Diagnosis Date  ? AAA (abdominal aortic aneurysm)   ? a. 2002 s/p repair.  ? Allergic rhinitis   ? Back pain   ? CAD (coronary artery disease)   ? a. 02/2002 H/o MI with stenting x 2; b. 04/2013 MV: EF 25%, large posterior lateral infarct w/o ischemia; c. 03/2016 VT Arrest/Cath: LM 60ost, LAD 40p/m ISR, LCX 100p/m, RCA nl-->Med Rx.  ? Carotid arterial disease ([place])   ? a. 12/2010 s/p R CEA;  b. 05/2015 Carotid U/S: bilat <40% ICA stenosis.  ? Chronic combined systolic and diastolic CHF (congestive heart  failure) ([place])   ? a. 03/2016 Echo: Ef 35-40%, grade 1 DD.  ? Compression fracture   ? COPD (chronic obstructive pulmonary disease) ([place])   ? Dermatitis   ? Diverticulosis of colon   ? DJD (degenerative joint disease)   ? and Gout  ? Hemorrhoids   ? History of colonic polyps   ? History of gout   ? History of pneumonia   ? Hypercholesterolemia   ? Hypertension   ? Hypertensive heart disease   ? Ischemic cardiomyopathy   ? a. EF prev <35%-->improved to normal by Echo 8/14:  Mild LVH, focal basal hypertrophy, EF 60-65%, normal wall motion, mild BAE, PASP 36; c. 03/2016 Echo: EF 35-40%, Gr1 DD, triv AI, mild MR, mod dil LA, mild-mod TR, PASP 62mHg.  ? Osteoporosis   ? Peripheral vascular disease ([place]   ? Presence of cardiac defibrillator   ? a. 03/2008 s/p MDT [id number] Maximo II DR, DC AICD; b. 03/2013: ICD shock for T wave oversensing;  c. 10/2015: collective decision not to replace ICD given improvement in LV fxn; d. VT Arrest-->Gen change to MDT ser # [id number].  ? Skin cancer   ? shoulders and forehead  ? Syncope   ? T wave over sensing resulting in inappropriate shocks   ? a. 03/2013.  ? Tobacco abuse   ? Ventricular tachycardia 04/01/2016  ? ? ?Past Surgical History:  ?Procedure Laterality Date  ?  ABDOMINAL AORTIC ANEURYSM REPAIR  2002  ? by Dr. Kellie Simmering  ? aicd placed  03/2008  ? Dr. Caryl Comes  ? cad stent  02/2002  ? Dr. Percival Spanish  ? CARDIAC CATHETERIZATION    ? X 2 stents  ? CARDIAC CATHETERIZATION N/A 04/03/2016  ? Procedure: Left Heart Cath and Coronary Angiography;  Surgeon: Belva Crome, MD;  Location: [facility];  Service: Cardiovascular;  Laterality: N/A;  ? CARDIAC DEFIBRILLATOR PLACEMENT  2009  ? CARDIAC DEFIBRILLATOR PLACEMENT    ? CAROTID ENDARTERECTOMY Right January 02, 2011  ? EAR CYST EXCISION Left 12/13/2015  ? Procedure: Excision left ear lesion ;  Surgeon: Melissa Montane, MD;  Location: [facility];  Service: ENT;  Laterality: Left;  ? EP IMPLANTABLE DEVICE N/A 04/06/2016  ? Procedure:  ICD Generator Changeout;   Surgeon: Deboraha Sprang, MD;  Location: [facility];  Service: Cardiovascular;  Laterality: N/A;  ? EXCISION OF LESION LEFT EAR Left 12/13/2015  ? EXTERNAL EAR SURGERY Left   ? Cancer Removal from left ear  ? I & D EXTREMITY Left 04/23/2016  ? Procedure: IRRIGATION AND DEBRIDEMENT HAND;  Surgeon: Leandrew Koyanagi, MD;  Location: [facility];  Service: Orthopedics;  Laterality: Left;  ? IR IMAGE GUIDED DRAINAGE BY PERCUTANEOUS CATHETER  10/08/2017  ? lumbar back    ? done after compression fracture  ? OPEN REDUCTION INTERNAL FIXATION (ORIF) METACARPAL Left 04/04/2016  ? Procedure: OPEN REDUCTION INTERNAL FIXATION (ORIF) LEFT 2ND, 3RD, 4TH METACARPAL FRACTURE;  Surgeon: Leandrew Koyanagi, MD;  Location: [facility];  Service: Orthopedics;  Laterality: Left;  OPEN REDUCTION INTERNAL FIXATION (ORIF) LEFT 2ND, 3RD, 4TH METACARPAL FRACTURE  ? OPEN REDUCTION INTERNAL FIXATION (ORIF) METACARPAL Left 04/23/2016  ? Procedure: REVISION OPEN REDUCTION INTERNAL FIXATION (ORIF) 2ND METACARPAL;  Surgeon: Leandrew Koyanagi, MD;  Location: [facility];  Service: Orthopedics;  Laterality: Left;  ? right corotid enderectomy  12/2010  ? Dr. Scot Dock  ? SKIN SPLIT GRAFT Left 12/13/2015  ? Procedure: with possible skin graft;  Surgeon: Melissa Montane, MD;  Location: [facility];  Service: ENT;  Laterality: Left;  ? SQUAMOUS CELL CARCINOMA EXCISION    ? on back, forehead, arm  ? TONSILLECTOMY    ? ? ?Family History  ?Problem Relation Age of Onset  ? Hypertension Father   ? Heart disease Father 59  ?     Heart Disease before age 54  ? Alcohol abuse Father   ?     causing multiple medical problems; uncertain exact cause of death  ? Hypertension Mother   ? Heart disease Mother   ?     Heart Disease before age 57  ? Cancer Mother   ?     pancreatic  ? Scoliosis Sister   ? Thyroid disease Daughter   ? Anxiety disorder Son   ? ? ?Current Outpatient Medications:  ?  allopurinol (ZYLOPRIM) 100 MG tablet, TAKE 1 TABLET ONCE DAILY., Disp: 30 tablet, Rfl: 10 ?  amiodarone (PACERONE) 200 MG  tablet, TAKE 1 TABLET ('200MG'$ ) BY MOUTH 5 DAYS PER WEEK., Disp: 90 tablet, Rfl: 3 ?  amoxicillin (AMOXIL) 875 MG tablet, Take 875 mg by mouth 2 (two) times daily., Disp: , Rfl:  ?  aspirin EC 81 MG tablet, Take 81 mg by mouth daily., Disp: , Rfl:  ?  budesonide (PULMICORT) 0.5 MG/2ML nebulizer solution, Take 2 mLs (0.5 mg total) by nebulization 2 (two) times daily., Disp: 120 mL, Rfl: 0 ?  Cholecalciferol (VITAMIN D) 50 MCG (2000 UT) CAPS, Take 2,000 Units by mouth daily., Disp: , Rfl:  ?  fluticasone (CUTIVATE) 0.05 % cream, Apply topically 2 (two) times daily as needed., Disp: , Rfl:  ?  formoterol (PERFOROMIST) 20 MCG/2ML nebulizer solution, Take 2 mLs (20 mcg total) by nebulization 2 (two) times daily., Disp: 120 mL, Rfl: 5 ?  furosemide (LASIX) 20 MG tablet, TAKE 1 TABLET ONCE DAILY., Disp: 90 tablet, Rfl: 3 ?  guaiFENesin (MUCINEX) 600 MG 12 hr tablet, Take 600 mg by mouth in the morning, at noon, in the evening, and at bedtime. , Disp: , Rfl:  ?  levothyroxine (SYNTHROID) 125 MCG tablet, TAKE ONE TABLET BY MOUTH ONCE DAILY, Disp: 90 tablet, Rfl: 1 ?  magnesium oxide (MAG-OX) 400 (241.3 Mg) MG tablet, Take 0.5 tablets (200 mg total) by mouth daily., Disp: 30 tablet, Rfl: 0 ?  midodrine (PROAMATINE) 2.5 MG tablet, TAKE 1 TABLET 3 TIMES DAILY WITH MEALS., Disp: 270 tablet, Rfl: 3 ?  Multiple Vitamin (MULTIVITAMIN ADULT PO), Take by mouth., Disp: , Rfl:  ?  Multiple Vitamins-Minerals (PRESERVISION AREDS 2 PO), Take 1 capsule by mouth daily., Disp: , Rfl:  ?  mupirocin ointment (BACTROBAN) 2 %, Apply topically as needed., Disp: , Rfl:  ?  nystatin (MYCOSTATIN) 100000 UNIT/ML suspension, Take 100,000 Units by mouth in the morning, at noon, in the evening, and at bedtime., Disp: , Rfl:  ?  oxybutynin (DITROPAN) 5 MG tablet, TAKE ONE TABLET BY MOUTH TWICE DAILY, Disp: 180 tablet, Rfl: 0 ?  polyethylene glycol (MIRALAX / GLYCOLAX) 17 g packet, Take 17 g by mouth daily., Disp: , Rfl:  ?  pravastatin (PRAVACHOL) 40 MG  tablet, TAKE 1 TABLET ONCE DAILY., Disp: 90 tablet, Rfl: 0 ?  revefenacin (YUPELRI) 175 MCG/3ML nebulizer solution, Take 3 mLs (175 mcg total) by nebulization daily., Disp: 3 mL, Rfl: 0 ?  sertraline (ZOLOFT) 50

## 2022-02-07 NOTE — Telephone Encounter (Signed)
Pt daughter dropped off alternative number for VV: ? ?[phone] Katharine Look) ? ?Please advise.  ?

## 2022-02-08 ENCOUNTER — Encounter: Payer: Self-pay | Admitting: Family Medicine

## 2022-02-08 LAB — URINE CULTURE
MICRO NUMBER:: 13284620
Result:: NO GROWTH
SPECIMEN QUALITY:: ADEQUATE

## 2022-02-08 NOTE — Telephone Encounter (Signed)
Received fax from [facility] regarding clearance to start Prolia.  He underwent dental extractions on 02/06/22. Procedure was uneventful. Dr. Kateri Mc that patient should not initiate Prolia until after 04/08/22 ? ?Knox Saliva, PharmD, MPH, BCPS ?Clinical Pharmacist (Rheumatology and Pulmonology) ?

## 2022-02-09 ENCOUNTER — Telehealth: Payer: Self-pay | Admitting: Family Medicine

## 2022-02-09 ENCOUNTER — Other Ambulatory Visit: Payer: Self-pay | Admitting: Family Medicine

## 2022-02-09 DIAGNOSIS — I739 Peripheral vascular disease, unspecified: Secondary | ICD-10-CM

## 2022-02-09 DIAGNOSIS — I255 Ischemic cardiomyopathy: Secondary | ICD-10-CM

## 2022-02-09 DIAGNOSIS — R531 Weakness: Secondary | ICD-10-CM

## 2022-02-09 DIAGNOSIS — I10 Essential (primary) hypertension: Secondary | ICD-10-CM

## 2022-02-09 DIAGNOSIS — J449 Chronic obstructive pulmonary disease, unspecified: Secondary | ICD-10-CM

## 2022-02-09 DIAGNOSIS — I272 Pulmonary hypertension, unspecified: Secondary | ICD-10-CM

## 2022-02-09 NOTE — Telephone Encounter (Signed)
[name] (pt daughter) called asking if Dr. Ethlyn Gallery can provide [facility] services for the pt since his health has been declining since his appt on 4/19 and she feels like he will need a nurse to come to his home to access him. She is aware that he doesn't want to leave his house. [name] would like someone to give her a call once the [facility] services has been placed. [name] [phone] ? ?Please advise.  ?

## 2022-02-09 NOTE — Telephone Encounter (Signed)
Order placed

## 2022-02-11 ENCOUNTER — Emergency Department (HOSPITAL_COMMUNITY): Payer: Medicare Other

## 2022-02-11 ENCOUNTER — Emergency Department (HOSPITAL_COMMUNITY)
Admission: EM | Admit: 2022-02-11 | Discharge: 2022-02-11 | Disposition: A | Discharge status: Home / Self Care | Payer: Medicare Other | Source: Home / Self Care | Attending: Emergency Medicine | Admitting: Emergency Medicine

## 2022-02-11 ENCOUNTER — Other Ambulatory Visit: Payer: Self-pay

## 2022-02-11 ENCOUNTER — Encounter (HOSPITAL_COMMUNITY): Payer: Self-pay | Admitting: Emergency Medicine

## 2022-02-11 DIAGNOSIS — I5042 Chronic combined systolic (congestive) and diastolic (congestive) heart failure: Secondary | ICD-10-CM | POA: Diagnosis not present

## 2022-02-11 DIAGNOSIS — J329 Chronic sinusitis, unspecified: Secondary | ICD-10-CM | POA: Diagnosis not present

## 2022-02-11 DIAGNOSIS — R32 Unspecified urinary incontinence: Secondary | ICD-10-CM | POA: Insufficient documentation

## 2022-02-11 DIAGNOSIS — D62 Acute posthemorrhagic anemia: Secondary | ICD-10-CM | POA: Diagnosis not present

## 2022-02-11 DIAGNOSIS — I130 Hypertensive heart and chronic kidney disease with heart failure and stage 1 through stage 4 chronic kidney disease, or unspecified chronic kidney disease: Secondary | ICD-10-CM | POA: Diagnosis present

## 2022-02-11 DIAGNOSIS — Z79899 Other long term (current) drug therapy: Secondary | ICD-10-CM | POA: Insufficient documentation

## 2022-02-11 DIAGNOSIS — N183 Chronic kidney disease, stage 3 unspecified: Secondary | ICD-10-CM | POA: Diagnosis not present

## 2022-02-11 DIAGNOSIS — R1312 Dysphagia, oropharyngeal phase: Secondary | ICD-10-CM | POA: Diagnosis not present

## 2022-02-11 DIAGNOSIS — R4181 Age-related cognitive decline: Secondary | ICD-10-CM | POA: Diagnosis not present

## 2022-02-11 DIAGNOSIS — J9611 Chronic respiratory failure with hypoxia: Secondary | ICD-10-CM | POA: Diagnosis present

## 2022-02-11 DIAGNOSIS — W010XXA Fall on same level from slipping, tripping and stumbling without subsequent striking against object, initial encounter: Secondary | ICD-10-CM | POA: Diagnosis present

## 2022-02-11 DIAGNOSIS — N179 Acute kidney failure, unspecified: Secondary | ICD-10-CM | POA: Diagnosis present

## 2022-02-11 DIAGNOSIS — E039 Hypothyroidism, unspecified: Secondary | ICD-10-CM | POA: Diagnosis present

## 2022-02-11 DIAGNOSIS — Z95810 Presence of automatic (implantable) cardiac defibrillator: Secondary | ICD-10-CM | POA: Diagnosis not present

## 2022-02-11 DIAGNOSIS — G8911 Acute pain due to trauma: Secondary | ICD-10-CM | POA: Diagnosis not present

## 2022-02-11 DIAGNOSIS — M109 Gout, unspecified: Secondary | ICD-10-CM | POA: Diagnosis present

## 2022-02-11 DIAGNOSIS — W19XXXD Unspecified fall, subsequent encounter: Secondary | ICD-10-CM | POA: Diagnosis not present

## 2022-02-11 DIAGNOSIS — I714 Abdominal aortic aneurysm, without rupture, unspecified: Secondary | ICD-10-CM | POA: Diagnosis present

## 2022-02-11 DIAGNOSIS — I739 Peripheral vascular disease, unspecified: Secondary | ICD-10-CM | POA: Diagnosis not present

## 2022-02-11 DIAGNOSIS — I428 Other cardiomyopathies: Secondary | ICD-10-CM | POA: Diagnosis present

## 2022-02-11 DIAGNOSIS — Z743 Need for continuous supervision: Secondary | ICD-10-CM | POA: Diagnosis not present

## 2022-02-11 DIAGNOSIS — J341 Cyst and mucocele of nose and nasal sinus: Secondary | ICD-10-CM | POA: Diagnosis not present

## 2022-02-11 DIAGNOSIS — S72141A Displaced intertrochanteric fracture of right femur, initial encounter for closed fracture: Secondary | ICD-10-CM | POA: Diagnosis not present

## 2022-02-11 DIAGNOSIS — R339 Retention of urine, unspecified: Secondary | ICD-10-CM

## 2022-02-11 DIAGNOSIS — R7989 Other specified abnormal findings of blood chemistry: Secondary | ICD-10-CM | POA: Insufficient documentation

## 2022-02-11 DIAGNOSIS — S069XAD Unspecified intracranial injury with loss of consciousness status unknown, subsequent encounter: Secondary | ICD-10-CM | POA: Diagnosis not present

## 2022-02-11 DIAGNOSIS — L219 Seborrheic dermatitis, unspecified: Secondary | ICD-10-CM | POA: Diagnosis present

## 2022-02-11 DIAGNOSIS — R350 Frequency of micturition: Secondary | ICD-10-CM | POA: Insufficient documentation

## 2022-02-11 DIAGNOSIS — E7800 Pure hypercholesterolemia, unspecified: Secondary | ICD-10-CM | POA: Diagnosis present

## 2022-02-11 DIAGNOSIS — S72141D Displaced intertrochanteric fracture of right femur, subsequent encounter for closed fracture with routine healing: Secondary | ICD-10-CM | POA: Diagnosis not present

## 2022-02-11 DIAGNOSIS — Z9861 Coronary angioplasty status: Secondary | ICD-10-CM | POA: Diagnosis not present

## 2022-02-11 DIAGNOSIS — S0990XA Unspecified injury of head, initial encounter: Secondary | ICD-10-CM | POA: Diagnosis not present

## 2022-02-11 DIAGNOSIS — Z66 Do not resuscitate: Secondary | ICD-10-CM | POA: Diagnosis not present

## 2022-02-11 DIAGNOSIS — I110 Hypertensive heart disease with heart failure: Secondary | ICD-10-CM | POA: Diagnosis not present

## 2022-02-11 DIAGNOSIS — R21 Rash and other nonspecific skin eruption: Secondary | ICD-10-CM | POA: Insufficient documentation

## 2022-02-11 DIAGNOSIS — Z6827 Body mass index (BMI) 27.0-27.9, adult: Secondary | ICD-10-CM | POA: Diagnosis not present

## 2022-02-11 DIAGNOSIS — N1832 Chronic kidney disease, stage 3b: Secondary | ICD-10-CM | POA: Diagnosis present

## 2022-02-11 DIAGNOSIS — R279 Unspecified lack of coordination: Secondary | ICD-10-CM | POA: Diagnosis not present

## 2022-02-11 DIAGNOSIS — Z7982 Long term (current) use of aspirin: Secondary | ICD-10-CM | POA: Insufficient documentation

## 2022-02-11 DIAGNOSIS — W19XXXA Unspecified fall, initial encounter: Secondary | ICD-10-CM | POA: Diagnosis not present

## 2022-02-11 DIAGNOSIS — E559 Vitamin D deficiency, unspecified: Secondary | ICD-10-CM | POA: Diagnosis not present

## 2022-02-11 DIAGNOSIS — M8938 Hypertrophy of bone, other site: Secondary | ICD-10-CM | POA: Diagnosis not present

## 2022-02-11 DIAGNOSIS — R2681 Unsteadiness on feet: Secondary | ICD-10-CM | POA: Diagnosis not present

## 2022-02-11 DIAGNOSIS — E860 Dehydration: Secondary | ICD-10-CM | POA: Diagnosis not present

## 2022-02-11 DIAGNOSIS — M7989 Other specified soft tissue disorders: Secondary | ICD-10-CM | POA: Diagnosis not present

## 2022-02-11 DIAGNOSIS — R531 Weakness: Secondary | ICD-10-CM | POA: Diagnosis not present

## 2022-02-11 DIAGNOSIS — R9082 White matter disease, unspecified: Secondary | ICD-10-CM | POA: Diagnosis not present

## 2022-02-11 DIAGNOSIS — N189 Chronic kidney disease, unspecified: Secondary | ICD-10-CM | POA: Insufficient documentation

## 2022-02-11 DIAGNOSIS — R410 Disorientation, unspecified: Secondary | ICD-10-CM | POA: Insufficient documentation

## 2022-02-11 DIAGNOSIS — I959 Hypotension, unspecified: Secondary | ICD-10-CM | POA: Diagnosis not present

## 2022-02-11 DIAGNOSIS — M25551 Pain in right hip: Secondary | ICD-10-CM | POA: Diagnosis not present

## 2022-02-11 DIAGNOSIS — Z4889 Encounter for other specified surgical aftercare: Secondary | ICD-10-CM | POA: Diagnosis not present

## 2022-02-11 DIAGNOSIS — L89103 Pressure ulcer of unspecified part of back, stage 3: Secondary | ICD-10-CM | POA: Diagnosis not present

## 2022-02-11 DIAGNOSIS — S199XXA Unspecified injury of neck, initial encounter: Secondary | ICD-10-CM | POA: Diagnosis not present

## 2022-02-11 DIAGNOSIS — S72001A Fracture of unspecified part of neck of right femur, initial encounter for closed fracture: Secondary | ICD-10-CM | POA: Diagnosis not present

## 2022-02-11 DIAGNOSIS — I10 Essential (primary) hypertension: Secondary | ICD-10-CM | POA: Diagnosis not present

## 2022-02-11 DIAGNOSIS — E785 Hyperlipidemia, unspecified: Secondary | ICD-10-CM | POA: Diagnosis not present

## 2022-02-11 DIAGNOSIS — R278 Other lack of coordination: Secondary | ICD-10-CM | POA: Diagnosis not present

## 2022-02-11 DIAGNOSIS — I509 Heart failure, unspecified: Secondary | ICD-10-CM | POA: Diagnosis not present

## 2022-02-11 DIAGNOSIS — J449 Chronic obstructive pulmonary disease, unspecified: Secondary | ICD-10-CM | POA: Diagnosis not present

## 2022-02-11 DIAGNOSIS — M6281 Muscle weakness (generalized): Secondary | ICD-10-CM | POA: Diagnosis not present

## 2022-02-11 DIAGNOSIS — G319 Degenerative disease of nervous system, unspecified: Secondary | ICD-10-CM | POA: Diagnosis not present

## 2022-02-11 DIAGNOSIS — R5381 Other malaise: Secondary | ICD-10-CM | POA: Diagnosis not present

## 2022-02-11 DIAGNOSIS — M25559 Pain in unspecified hip: Secondary | ICD-10-CM | POA: Diagnosis not present

## 2022-02-11 DIAGNOSIS — M2578 Osteophyte, vertebrae: Secondary | ICD-10-CM | POA: Diagnosis not present

## 2022-02-11 DIAGNOSIS — E43 Unspecified severe protein-calorie malnutrition: Secondary | ICD-10-CM | POA: Diagnosis present

## 2022-02-11 DIAGNOSIS — Y92013 Bedroom of single-family (private) house as the place of occurrence of the external cause: Secondary | ICD-10-CM | POA: Diagnosis not present

## 2022-02-11 DIAGNOSIS — Z4789 Encounter for other orthopedic aftercare: Secondary | ICD-10-CM | POA: Diagnosis not present

## 2022-02-11 DIAGNOSIS — I639 Cerebral infarction, unspecified: Secondary | ICD-10-CM | POA: Diagnosis not present

## 2022-02-11 DIAGNOSIS — I9589 Other hypotension: Secondary | ICD-10-CM | POA: Diagnosis present

## 2022-02-11 DIAGNOSIS — I255 Ischemic cardiomyopathy: Secondary | ICD-10-CM | POA: Diagnosis not present

## 2022-02-11 DIAGNOSIS — N320 Bladder-neck obstruction: Secondary | ICD-10-CM | POA: Diagnosis present

## 2022-02-11 DIAGNOSIS — I252 Old myocardial infarction: Secondary | ICD-10-CM | POA: Diagnosis not present

## 2022-02-11 DIAGNOSIS — I2510 Atherosclerotic heart disease of native coronary artery without angina pectoris: Secondary | ICD-10-CM | POA: Diagnosis not present

## 2022-02-11 DIAGNOSIS — S72011A Unspecified intracapsular fracture of right femur, initial encounter for closed fracture: Secondary | ICD-10-CM | POA: Diagnosis not present

## 2022-02-11 LAB — I-STAT CHEM 8, ED
BUN: 36 mg/dL — ABNORMAL HIGH (ref 8–23)
Calcium, Ion: 1.13 mmol/L — ABNORMAL LOW (ref 1.15–1.40)
Chloride: 104 mmol/L (ref 98–111)
Creatinine, Ser: 1.6 mg/dL — ABNORMAL HIGH (ref 0.61–1.24)
Glucose, Bld: 94 mg/dL (ref 70–99)
HCT: 45 % (ref 39.0–52.0)
Hemoglobin: 15.3 g/dL (ref 13.0–17.0)
Potassium: 3.9 mmol/L (ref 3.5–5.1)
Sodium: 140 mmol/L (ref 135–145)
TCO2: 26 mmol/L (ref 22–32)

## 2022-02-11 LAB — DIFFERENTIAL
Abs Immature Granulocytes: 0.03 10*3/uL (ref 0.00–0.07)
Basophils Absolute: 0.1 10*3/uL (ref 0.0–0.1)
Basophils Relative: 1 %
Eosinophils Absolute: 1.3 10*3/uL — ABNORMAL HIGH (ref 0.0–0.5)
Eosinophils Relative: 14 %
Immature Granulocytes: 0 %
Lymphocytes Relative: 11 %
Lymphs Abs: 1 10*3/uL (ref 0.7–4.0)
Monocytes Absolute: 0.6 10*3/uL (ref 0.1–1.0)
Monocytes Relative: 7 %
Neutro Abs: 6.2 10*3/uL (ref 1.7–7.7)
Neutrophils Relative %: 67 %

## 2022-02-11 LAB — URINALYSIS, ROUTINE W REFLEX MICROSCOPIC
Bacteria, UA: NONE SEEN
Bilirubin Urine: NEGATIVE
Glucose, UA: NEGATIVE mg/dL
Hgb urine dipstick: NEGATIVE
Ketones, ur: 5 mg/dL — AB
Leukocytes,Ua: NEGATIVE
Nitrite: NEGATIVE
Protein, ur: 30 mg/dL — AB
Specific Gravity, Urine: 1.021 (ref 1.005–1.030)
pH: 5 (ref 5.0–8.0)

## 2022-02-11 LAB — APTT: aPTT: 30 seconds (ref 24–36)

## 2022-02-11 LAB — CBC
HCT: 43.8 % (ref 39.0–52.0)
Hemoglobin: 14.9 g/dL (ref 13.0–17.0)
MCH: 34.6 pg — ABNORMAL HIGH (ref 26.0–34.0)
MCHC: 34 g/dL (ref 30.0–36.0)
MCV: 101.6 fL — ABNORMAL HIGH (ref 80.0–100.0)
Platelets: 114 10*3/uL — ABNORMAL LOW (ref 150–400)
RBC: 4.31 MIL/uL (ref 4.22–5.81)
RDW: 13.5 % (ref 11.5–15.5)
WBC: 9.3 10*3/uL (ref 4.0–10.5)
nRBC: 0 % (ref 0.0–0.2)

## 2022-02-11 LAB — LIPASE, BLOOD: Lipase: 37 U/L (ref 11–51)

## 2022-02-11 LAB — COMPREHENSIVE METABOLIC PANEL
ALT: 22 U/L (ref 0–44)
AST: 32 U/L (ref 15–41)
Albumin: 3.8 g/dL (ref 3.5–5.0)
Alkaline Phosphatase: 78 U/L (ref 38–126)
Anion gap: 9 (ref 5–15)
BUN: 36 mg/dL — ABNORMAL HIGH (ref 8–23)
CO2: 26 mmol/L (ref 22–32)
Calcium: 9.6 mg/dL (ref 8.9–10.3)
Chloride: 103 mmol/L (ref 98–111)
Creatinine, Ser: 1.55 mg/dL — ABNORMAL HIGH (ref 0.61–1.24)
GFR, Estimated: 43 mL/min — ABNORMAL LOW (ref >60)
Glucose, Bld: 97 mg/dL (ref 70–99)
Potassium: 3.9 mmol/L (ref 3.5–5.1)
Sodium: 138 mmol/L (ref 135–145)
Total Bilirubin: 1.1 mg/dL (ref 0.3–1.2)
Total Protein: 7.8 g/dL (ref 6.5–8.1)

## 2022-02-11 LAB — PROTIME-INR
INR: 1.1 (ref 0.8–1.2)
Prothrombin Time: 14.4 seconds (ref 11.4–15.2)

## 2022-02-11 LAB — TROPONIN I (HIGH SENSITIVITY): Troponin I (High Sensitivity): 51 ng/L — ABNORMAL HIGH (ref <18)

## 2022-02-11 LAB — RAPID URINE DRUG SCREEN, HOSP PERFORMED
Amphetamines: NOT DETECTED
Barbiturates: NOT DETECTED
Benzodiazepines: NOT DETECTED
Cocaine: NOT DETECTED
Opiates: NOT DETECTED
Tetrahydrocannabinol: NOT DETECTED

## 2022-02-11 LAB — ETHANOL: Alcohol, Ethyl (B): <10 mg/dL (ref <10)

## 2022-02-11 LAB — AMMONIA: Ammonia: 12 umol/L (ref 9–35)

## 2022-02-11 MED ORDER — TAMSULOSIN HCL 0.4 MG PO CAPS
0.4000 mg | ORAL_CAPSULE | Freq: Every day | ORAL | 0 refills | Status: AC
Start: 2022-02-11 — End: 2022-03-13

## 2022-02-11 MED ORDER — SODIUM CHLORIDE 0.9 % IV SOLN
100.0000 mL/h | INTRAVENOUS | Status: DC
  Administered 2022-02-11: 100 mL/h via INTRAVENOUS

## 2022-02-11 MED ORDER — SODIUM CHLORIDE 0.9 % IV BOLUS
500.0000 mL | Freq: Once | INTRAVENOUS | Status: AC
  Administered 2022-02-11: 500 mL via INTRAVENOUS

## 2022-02-11 NOTE — ED Provider Notes (Signed)
?[name] ?Provider Note ? ? ?CSN: [id number] ?Arrival date & time: 02/11/22  0900 ? ?  ? ?History ? ?Chief Complaint  ?Patient presents with  ? Weakness  ? ? ?[name] is a 88 y.o. male. ? ? ?Weakness ?Associated symptoms: no fever   ? ?Patient has history of chronic kidney disease, hypertension, peripheral vascular disease, diverticulosis, AICD implanted, colonopathy, subdural hematoma, pulmonary hypertension, CHF, thrombocytopenia, SIADH, seborrheic dermatitis who presents to the ED with complaints of weakness.  Into the EMS report the patient's had generalized weakness for the last week.  He had a tooth extracted 1 week ago.  Patient does admit to not eating as well.  He was able to eat some scrambled eggs this morning.  Patient states he came to the emergency room because his daughter came to visit him last night and she is concerned about him. ? ?Outpatient records reviewed.  Patient's daughter called the doctor's office and was requesting home health services as the patient's health has been declining.  Patient also saw his primary care doctor via video visit on April 19.  Patient was having issues with decreased energy.  Patient also has been having episodes of urinary frequency and incontinence.  Outpatient laboratory test reviewed and he did have a urine culture on 419 that did not show any abnormalities ? ?Home Medications ?Prior to Admission medications   ?Medication Sig Start Date End Date Taking? Authorizing Provider  ?tamsulosin (FLOMAX) 0.4 MG CAPS capsule Take 1 capsule (0.4 mg total) by mouth daily. 02/11/22 03/13/22 Yes Dorie Rank, MD  ?allopurinol (ZYLOPRIM) 100 MG tablet TAKE 1 TABLET ONCE DAILY. 07/18/21   Minus Breeding, MD  ?amiodarone (PACERONE) 200 MG tablet TAKE 1 TABLET ('200MG'$ ) BY MOUTH 5 DAYS PER WEEK. 09/12/21   Deboraha Sprang, MD  ?amoxicillin (AMOXIL) 875 MG tablet Take 875 mg by mouth 2 (two) times daily. 02/06/22   [provider]  ?aspirin EC 81 MG tablet Take 81 mg by mouth daily.    [provider]  ?budesonide (PULMICORT) 0.5 MG/2ML nebulizer solution Take 2 mLs (0.5 mg total) by nebulization 2 (two) times daily. 01/27/19   Garner Nash, DO  ?Cholecalciferol (VITAMIN D) 50 MCG (2000 UT) CAPS Take 2,000 Units by mouth daily.    [provider]  ?fluticasone (CUTIVATE) 0.05 % cream Apply topically 2 (two) times daily as needed. 12/27/21   [provider]  ?formoterol (PERFOROMIST) 20 MCG/2ML nebulizer solution Take 2 mLs (20 mcg total) by nebulization 2 (two) times daily. 12/10/18   Garner Nash, DO  ?furosemide (LASIX) 20 MG tablet TAKE 1 TABLET ONCE DAILY. 08/22/21   Deboraha Sprang, MD  ?guaiFENesin (MUCINEX) 600 MG 12 hr tablet Take 600 mg by mouth in the morning, at noon, in the evening, and at bedtime.     [provider]  ?levothyroxine (SYNTHROID) 125 MCG tablet TAKE ONE TABLET BY MOUTH ONCE DAILY 01/22/22   Caren Macadam, MD  ?magnesium oxide (MAG-OX) 400 (241.3 Mg) MG tablet Take 0.5 tablets (200 mg total) by mouth daily. 04/27/16   Love, Ivan Anchors, PA-C  ?midodrine (PROAMATINE) 2.5 MG tablet TAKE 1 TABLET 3 TIMES DAILY WITH MEALS. 12/04/21   Deboraha Sprang, MD  ?Multiple Vitamin (MULTIVITAMIN ADULT PO) Take by mouth.    [provider]  ?Multiple Vitamins-Minerals (PRESERVISION AREDS 2 PO) Take 1 capsule by mouth daily.    [provider]  ?mupirocin ointment (BACTROBAN) 2 %  Apply topically as needed. 04/18/21   [provider]  ?nystatin (MYCOSTATIN) 100000 UNIT/ML suspension Take 100,000 Units by mouth in the morning, at noon, in the evening, and at bedtime. 02/21/21   [provider]  ?oxybutynin (DITROPAN) 5 MG tablet TAKE ONE TABLET BY MOUTH TWICE DAILY 12/17/21   Koberlein, Andris Flurry C, MD  ?polyethylene glycol (MIRALAX / GLYCOLAX) 17 g packet Take 17 g by mouth daily.    [provider]  ?pravastatin (PRAVACHOL) 40 MG tablet TAKE 1  TABLET ONCE DAILY. 12/17/21   Caren Macadam, MD  ?revefenacin (YUPELRI) 175 MCG/3ML nebulizer solution Take 3 mLs (175 mcg total) by nebulization daily. 09/23/20   Garner Nash, DO  ?sertraline (ZOLOFT) 50 MG tablet TAKE ONE TABLET AT BEDTIME 12/17/21   Koberlein, Steele Berg, MD  ?thiamine 100 MG tablet Take 1 tablet (100 mg total) by mouth daily. 04/26/16   Love, Ivan Anchors, PA-C  ?triamcinolone cream (KENALOG) 0.1 % Apply topically 2 (two) times daily as needed. 12/27/21   [provider]  ?   ? ?Allergies    ?Lisinopril   ? ?Review of Systems   ?Review of Systems  ?Constitutional:  Negative for fever.  ?Neurological:  Positive for weakness.  ?All other systems reviewed and are negative. ? ?Physical Exam ?Updated Vital Signs ?BP (!) 153/94   Pulse 81   Temp 98.4 ?F (36.9 ?C) (Oral)   Resp 16   SpO2 100%  ?Physical Exam ?Vitals and nursing note reviewed.  ?Constitutional:   ?   Appearance: He is well-developed.  ?   Comments: Elderly frail  ?HENT:  ?   Head: Normocephalic and atraumatic.  ?   Right Ear: External ear normal.  ?   Left Ear: External ear normal.  ?   Mouth/Throat:  ?   Mouth: Mucous membranes are dry.  ?   Comments: mucous membranes dry, poor dentition ?Eyes:  ?   General: No scleral icterus.    ?   Right eye: No discharge.     ?   Left eye: No discharge.  ?   Conjunctiva/sclera: Conjunctivae normal.  ?Neck:  ?   Trachea: No tracheal deviation.  ?Cardiovascular:  ?   Rate and Rhythm: Normal rate and regular rhythm.  ?Pulmonary:  ?   Effort: Pulmonary effort is normal. No respiratory distress.  ?   Breath sounds: Normal breath sounds. No stridor. No wheezing or rales.  ?Abdominal:  ?   General: Bowel sounds are normal. There is no distension.  ?   Palpations: Abdomen is soft.  ?   Tenderness: There is no abdominal tenderness. There is no guarding or rebound.  ?Musculoskeletal:     ?   General: No tenderness or deformity.  ?   Cervical back: Neck supple.  ?Skin: ?   General: Skin is warm and  dry.  ?   Findings: Rash present.  ?   Comments: Rash consistent with seborrheic dermatitis  ?Neurological:  ?   General: No focal deficit present.  ?   Mental Status: He is alert.  ?   Cranial Nerves: No cranial nerve deficit (no facial droop, extraocular movements intact, no slurred speech).  ?   Sensory: No sensory deficit.  ?   Motor: Weakness present. No abnormal muscle tone or seizure activity.  ?   Coordination: Coordination normal.  ?   Comments: Able to move all extremities but generally weak  ?Psychiatric:     ?   Mood and  Affect: Mood normal.  ? ? ?ED Results / Procedures / Treatments   ?Labs ?(all labs ordered are listed, but only abnormal results are displayed) ?Labs Reviewed  ?CBC - Abnormal; Notable for the following components:  ?    Result Value  ? MCV 101.6 (*)   ? MCH 34.6 (*)   ? Platelets 114 (*)   ? All other components within normal limits  ?DIFFERENTIAL - Abnormal; Notable for the following components:  ? Eosinophils Absolute 1.3 (*)   ? All other components within normal limits  ?COMPREHENSIVE METABOLIC PANEL - Abnormal; Notable for the following components:  ? BUN 36 (*)   ? Creatinine, Ser 1.55 (*)   ? GFR, Estimated 43 (*)   ? All other components within normal limits  ?URINALYSIS, ROUTINE W REFLEX MICROSCOPIC - Abnormal; Notable for the following components:  ? APPearance HAZY (*)   ? Ketones, ur 5 (*)   ? Protein, ur 30 (*)   ? All other components within normal limits  ?I-STAT CHEM 8, ED - Abnormal; Notable for the following components:  ? BUN 36 (*)   ? Creatinine, Ser 1.60 (*)   ? Calcium, Ion 1.13 (*)   ? All other components within normal limits  ?TROPONIN I (HIGH SENSITIVITY) - Abnormal; Notable for the following components:  ? Troponin I (High Sensitivity) 51 (*)   ? All other components within normal limits  ?URINE CULTURE  ?ETHANOL  ?PROTIME-INR  ?APTT  ?RAPID URINE DRUG SCREEN, HOSP PERFORMED  ?LIPASE, BLOOD  ?AMMONIA  ? ? ?EKG ?EKG Interpretation ? ?Date/Time:  Sunday February 11 2022 09:18:11 EDT ?Ventricular Rate:  61 ?PR Interval:  129 ?QRS Duration: 175 ?QT Interval:  474 ?QTC Calculation: 478 ?R Axis:   92 ?Text Interpretation: Sinus or ectopic atrial rhythm RBBB and LPFB Inferior i

## 2022-02-11 NOTE — ED Notes (Signed)
Pt transported to CT at this time.

## 2022-02-11 NOTE — ED Triage Notes (Signed)
Pt from home via [facility] with reports of generalized weakness. Pt reports getting a tooth extracted 1 week ago and since that time he has been feeling weak. Denies pain, N/V. ?

## 2022-02-11 NOTE — ED Notes (Signed)
Pt wheeled to waiting room. Pt verbalized understanding of discharge instructions.  ? Pt provided with leg foley bag and instructions on how to apply bag.  ?

## 2022-02-11 NOTE — Discharge Instructions (Signed)
Keep the Foley catheter and leg bag in place.  Start taking the medications to help with your prostate.  Call the urologist office to schedule an appointment.  Follow-up with your doctor next week to be rechecked to make sure you are improving.  Return as needed for worsening symptoms ?

## 2022-02-12 ENCOUNTER — Encounter (HOSPITAL_COMMUNITY): Payer: Self-pay | Admitting: *Deleted

## 2022-02-12 ENCOUNTER — Other Ambulatory Visit: Payer: Self-pay

## 2022-02-12 ENCOUNTER — Inpatient Hospital Stay (HOSPITAL_COMMUNITY): Payer: Medicare Other | Admitting: Anesthesiology

## 2022-02-12 ENCOUNTER — Emergency Department (HOSPITAL_COMMUNITY): Payer: Medicare Other

## 2022-02-12 ENCOUNTER — Inpatient Hospital Stay (HOSPITAL_COMMUNITY)
Admission: EM | Admit: 2022-02-12 | Discharge: 2022-02-16 | DRG: 480 | Disposition: A | Discharge status: Skilled Nursing Facility | Payer: Medicare Other | Attending: Internal Medicine | Admitting: Internal Medicine

## 2022-02-12 ENCOUNTER — Inpatient Hospital Stay (HOSPITAL_COMMUNITY): Payer: Medicare Other

## 2022-02-12 DIAGNOSIS — S72011A Unspecified intracapsular fracture of right femur, initial encounter for closed fracture: Secondary | ICD-10-CM | POA: Diagnosis present

## 2022-02-12 DIAGNOSIS — M898X9 Other specified disorders of bone, unspecified site: Secondary | ICD-10-CM | POA: Diagnosis present

## 2022-02-12 DIAGNOSIS — E7800 Pure hypercholesterolemia, unspecified: Secondary | ICD-10-CM | POA: Diagnosis present

## 2022-02-12 DIAGNOSIS — J449 Chronic obstructive pulmonary disease, unspecified: Secondary | ICD-10-CM | POA: Diagnosis present

## 2022-02-12 DIAGNOSIS — D62 Acute posthemorrhagic anemia: Secondary | ICD-10-CM | POA: Diagnosis not present

## 2022-02-12 DIAGNOSIS — Z9861 Coronary angioplasty status: Secondary | ICD-10-CM | POA: Diagnosis not present

## 2022-02-12 DIAGNOSIS — Z6827 Body mass index (BMI) 27.0-27.9, adult: Secondary | ICD-10-CM

## 2022-02-12 DIAGNOSIS — M109 Gout, unspecified: Secondary | ICD-10-CM | POA: Diagnosis present

## 2022-02-12 DIAGNOSIS — I739 Peripheral vascular disease, unspecified: Secondary | ICD-10-CM | POA: Diagnosis not present

## 2022-02-12 DIAGNOSIS — R339 Retention of urine, unspecified: Secondary | ICD-10-CM | POA: Diagnosis not present

## 2022-02-12 DIAGNOSIS — I714 Abdominal aortic aneurysm, without rupture, unspecified: Secondary | ICD-10-CM | POA: Diagnosis present

## 2022-02-12 DIAGNOSIS — Z8249 Family history of ischemic heart disease and other diseases of the circulatory system: Secondary | ICD-10-CM

## 2022-02-12 DIAGNOSIS — Z95810 Presence of automatic (implantable) cardiac defibrillator: Secondary | ICD-10-CM

## 2022-02-12 DIAGNOSIS — N1832 Chronic kidney disease, stage 3b: Secondary | ICD-10-CM | POA: Diagnosis present

## 2022-02-12 DIAGNOSIS — M810 Age-related osteoporosis without current pathological fracture: Secondary | ICD-10-CM | POA: Diagnosis present

## 2022-02-12 DIAGNOSIS — M25559 Pain in unspecified hip: Secondary | ICD-10-CM | POA: Diagnosis not present

## 2022-02-12 DIAGNOSIS — Z66 Do not resuscitate: Secondary | ICD-10-CM | POA: Diagnosis not present

## 2022-02-12 DIAGNOSIS — R2681 Unsteadiness on feet: Secondary | ICD-10-CM | POA: Diagnosis not present

## 2022-02-12 DIAGNOSIS — Z85828 Personal history of other malignant neoplasm of skin: Secondary | ICD-10-CM

## 2022-02-12 DIAGNOSIS — I10 Essential (primary) hypertension: Secondary | ICD-10-CM | POA: Diagnosis not present

## 2022-02-12 DIAGNOSIS — I255 Ischemic cardiomyopathy: Secondary | ICD-10-CM | POA: Diagnosis present

## 2022-02-12 DIAGNOSIS — I428 Other cardiomyopathies: Secondary | ICD-10-CM | POA: Diagnosis present

## 2022-02-12 DIAGNOSIS — S069XAD Unspecified intracranial injury with loss of consciousness status unknown, subsequent encounter: Secondary | ICD-10-CM | POA: Diagnosis not present

## 2022-02-12 DIAGNOSIS — R4181 Age-related cognitive decline: Secondary | ICD-10-CM | POA: Diagnosis not present

## 2022-02-12 DIAGNOSIS — W19XXXD Unspecified fall, subsequent encounter: Secondary | ICD-10-CM | POA: Diagnosis not present

## 2022-02-12 DIAGNOSIS — S199XXA Unspecified injury of neck, initial encounter: Secondary | ICD-10-CM | POA: Diagnosis not present

## 2022-02-12 DIAGNOSIS — I5042 Chronic combined systolic (congestive) and diastolic (congestive) heart failure: Secondary | ICD-10-CM | POA: Diagnosis present

## 2022-02-12 DIAGNOSIS — W19XXXA Unspecified fall, initial encounter: Secondary | ICD-10-CM

## 2022-02-12 DIAGNOSIS — I2510 Atherosclerotic heart disease of native coronary artery without angina pectoris: Secondary | ICD-10-CM | POA: Diagnosis present

## 2022-02-12 DIAGNOSIS — G8911 Acute pain due to trauma: Secondary | ICD-10-CM | POA: Diagnosis not present

## 2022-02-12 DIAGNOSIS — Z7982 Long term (current) use of aspirin: Secondary | ICD-10-CM

## 2022-02-12 DIAGNOSIS — S72141D Displaced intertrochanteric fracture of right femur, subsequent encounter for closed fracture with routine healing: Secondary | ICD-10-CM | POA: Diagnosis not present

## 2022-02-12 DIAGNOSIS — N179 Acute kidney failure, unspecified: Secondary | ICD-10-CM | POA: Diagnosis present

## 2022-02-12 DIAGNOSIS — E860 Dehydration: Secondary | ICD-10-CM | POA: Diagnosis present

## 2022-02-12 DIAGNOSIS — M2578 Osteophyte, vertebrae: Secondary | ICD-10-CM | POA: Diagnosis not present

## 2022-02-12 DIAGNOSIS — W010XXA Fall on same level from slipping, tripping and stumbling without subsequent striking against object, initial encounter: Secondary | ICD-10-CM | POA: Diagnosis present

## 2022-02-12 DIAGNOSIS — I110 Hypertensive heart disease with heart failure: Secondary | ICD-10-CM | POA: Diagnosis not present

## 2022-02-12 DIAGNOSIS — E785 Hyperlipidemia, unspecified: Secondary | ICD-10-CM | POA: Diagnosis not present

## 2022-02-12 DIAGNOSIS — R9082 White matter disease, unspecified: Secondary | ICD-10-CM | POA: Diagnosis not present

## 2022-02-12 DIAGNOSIS — E559 Vitamin D deficiency, unspecified: Secondary | ICD-10-CM | POA: Diagnosis not present

## 2022-02-12 DIAGNOSIS — Z79899 Other long term (current) drug therapy: Secondary | ICD-10-CM

## 2022-02-12 DIAGNOSIS — S72001A Fracture of unspecified part of neck of right femur, initial encounter for closed fracture: Secondary | ICD-10-CM | POA: Diagnosis present

## 2022-02-12 DIAGNOSIS — J9611 Chronic respiratory failure with hypoxia: Secondary | ICD-10-CM | POA: Diagnosis present

## 2022-02-12 DIAGNOSIS — Z960 Presence of urogenital implants: Secondary | ICD-10-CM | POA: Diagnosis present

## 2022-02-12 DIAGNOSIS — N183 Chronic kidney disease, stage 3 unspecified: Secondary | ICD-10-CM | POA: Diagnosis not present

## 2022-02-12 DIAGNOSIS — S72141A Displaced intertrochanteric fracture of right femur, initial encounter for closed fracture: Principal | ICD-10-CM | POA: Diagnosis present

## 2022-02-12 DIAGNOSIS — Z743 Need for continuous supervision: Secondary | ICD-10-CM | POA: Diagnosis not present

## 2022-02-12 DIAGNOSIS — I130 Hypertensive heart and chronic kidney disease with heart failure and stage 1 through stage 4 chronic kidney disease, or unspecified chronic kidney disease: Secondary | ICD-10-CM | POA: Diagnosis present

## 2022-02-12 DIAGNOSIS — I252 Old myocardial infarction: Secondary | ICD-10-CM

## 2022-02-12 DIAGNOSIS — Z800 Family history of malignant neoplasm of digestive organs: Secondary | ICD-10-CM

## 2022-02-12 DIAGNOSIS — I959 Hypotension, unspecified: Secondary | ICD-10-CM | POA: Diagnosis not present

## 2022-02-12 DIAGNOSIS — N320 Bladder-neck obstruction: Secondary | ICD-10-CM | POA: Diagnosis present

## 2022-02-12 DIAGNOSIS — E039 Hypothyroidism, unspecified: Secondary | ICD-10-CM | POA: Diagnosis present

## 2022-02-12 DIAGNOSIS — L219 Seborrheic dermatitis, unspecified: Secondary | ICD-10-CM | POA: Diagnosis present

## 2022-02-12 DIAGNOSIS — G319 Degenerative disease of nervous system, unspecified: Secondary | ICD-10-CM | POA: Diagnosis not present

## 2022-02-12 DIAGNOSIS — S0990XA Unspecified injury of head, initial encounter: Secondary | ICD-10-CM | POA: Diagnosis not present

## 2022-02-12 DIAGNOSIS — I9589 Other hypotension: Secondary | ICD-10-CM | POA: Diagnosis present

## 2022-02-12 DIAGNOSIS — Y92013 Bedroom of single-family (private) house as the place of occurrence of the external cause: Secondary | ICD-10-CM

## 2022-02-12 DIAGNOSIS — M7989 Other specified soft tissue disorders: Secondary | ICD-10-CM | POA: Diagnosis not present

## 2022-02-12 DIAGNOSIS — Z955 Presence of coronary angioplasty implant and graft: Secondary | ICD-10-CM

## 2022-02-12 DIAGNOSIS — Z7951 Long term (current) use of inhaled steroids: Secondary | ICD-10-CM

## 2022-02-12 DIAGNOSIS — Z86010 Personal history of colonic polyps: Secondary | ICD-10-CM

## 2022-02-12 DIAGNOSIS — Z79890 Hormone replacement therapy (postmenopausal): Secondary | ICD-10-CM

## 2022-02-12 DIAGNOSIS — Z87820 Personal history of traumatic brain injury: Secondary | ICD-10-CM

## 2022-02-12 DIAGNOSIS — S069XAA Unspecified intracranial injury with loss of consciousness status unknown, initial encounter: Secondary | ICD-10-CM | POA: Diagnosis present

## 2022-02-12 DIAGNOSIS — M25551 Pain in right hip: Secondary | ICD-10-CM | POA: Diagnosis not present

## 2022-02-12 DIAGNOSIS — L89103 Pressure ulcer of unspecified part of back, stage 3: Secondary | ICD-10-CM | POA: Diagnosis not present

## 2022-02-12 DIAGNOSIS — Z888 Allergy status to other drugs, medicaments and biological substances status: Secondary | ICD-10-CM

## 2022-02-12 DIAGNOSIS — Z87891 Personal history of nicotine dependence: Secondary | ICD-10-CM

## 2022-02-12 DIAGNOSIS — R5381 Other malaise: Secondary | ICD-10-CM | POA: Diagnosis not present

## 2022-02-12 DIAGNOSIS — R279 Unspecified lack of coordination: Secondary | ICD-10-CM | POA: Diagnosis not present

## 2022-02-12 DIAGNOSIS — Z818 Family history of other mental and behavioral disorders: Secondary | ICD-10-CM

## 2022-02-12 DIAGNOSIS — M8938 Hypertrophy of bone, other site: Secondary | ICD-10-CM | POA: Diagnosis not present

## 2022-02-12 DIAGNOSIS — R278 Other lack of coordination: Secondary | ICD-10-CM | POA: Diagnosis not present

## 2022-02-12 DIAGNOSIS — M6281 Muscle weakness (generalized): Secondary | ICD-10-CM | POA: Diagnosis not present

## 2022-02-12 DIAGNOSIS — Z8701 Personal history of pneumonia (recurrent): Secondary | ICD-10-CM

## 2022-02-12 DIAGNOSIS — Z4789 Encounter for other orthopedic aftercare: Secondary | ICD-10-CM | POA: Diagnosis not present

## 2022-02-12 DIAGNOSIS — E43 Unspecified severe protein-calorie malnutrition: Secondary | ICD-10-CM | POA: Diagnosis present

## 2022-02-12 DIAGNOSIS — I509 Heart failure, unspecified: Secondary | ICD-10-CM | POA: Diagnosis not present

## 2022-02-12 DIAGNOSIS — R1312 Dysphagia, oropharyngeal phase: Secondary | ICD-10-CM | POA: Diagnosis not present

## 2022-02-12 DIAGNOSIS — Z4889 Encounter for other specified surgical aftercare: Secondary | ICD-10-CM | POA: Diagnosis not present

## 2022-02-12 DIAGNOSIS — Z8679 Personal history of other diseases of the circulatory system: Secondary | ICD-10-CM

## 2022-02-12 LAB — COMPREHENSIVE METABOLIC PANEL
ALT: 23 U/L (ref 0–44)
AST: 31 U/L (ref 15–41)
Albumin: 3.9 g/dL (ref 3.5–5.0)
Alkaline Phosphatase: 86 U/L (ref 38–126)
Anion gap: 10 (ref 5–15)
BUN: 38 mg/dL — ABNORMAL HIGH (ref 8–23)
CO2: 24 mmol/L (ref 22–32)
Calcium: 9.5 mg/dL (ref 8.9–10.3)
Chloride: 103 mmol/L (ref 98–111)
Creatinine, Ser: 1.52 mg/dL — ABNORMAL HIGH (ref 0.61–1.24)
GFR, Estimated: 44 mL/min — ABNORMAL LOW (ref >60)
Glucose, Bld: 132 mg/dL — ABNORMAL HIGH (ref 70–99)
Potassium: 3.5 mmol/L (ref 3.5–5.1)
Sodium: 137 mmol/L (ref 135–145)
Total Bilirubin: 1.5 mg/dL — ABNORMAL HIGH (ref 0.3–1.2)
Total Protein: 8 g/dL (ref 6.5–8.1)

## 2022-02-12 LAB — CBC WITH DIFFERENTIAL/PLATELET
Abs Immature Granulocytes: 0.03 10*3/uL (ref 0.00–0.07)
Basophils Absolute: 0 10*3/uL (ref 0.0–0.1)
Basophils Relative: 0 %
Eosinophils Absolute: 0.2 10*3/uL (ref 0.0–0.5)
Eosinophils Relative: 2 %
HCT: 41.3 % (ref 39.0–52.0)
Hemoglobin: 14 g/dL (ref 13.0–17.0)
Immature Granulocytes: 0 %
Lymphocytes Relative: 8 %
Lymphs Abs: 0.9 10*3/uL (ref 0.7–4.0)
MCH: 34.1 pg — ABNORMAL HIGH (ref 26.0–34.0)
MCHC: 33.9 g/dL (ref 30.0–36.0)
MCV: 100.5 fL — ABNORMAL HIGH (ref 80.0–100.0)
Monocytes Absolute: 0.9 10*3/uL (ref 0.1–1.0)
Monocytes Relative: 8 %
Neutro Abs: 9.5 10*3/uL — ABNORMAL HIGH (ref 1.7–7.7)
Neutrophils Relative %: 82 %
Platelets: UNDETERMINED 10*3/uL (ref 150–400)
RBC: 4.11 MIL/uL — ABNORMAL LOW (ref 4.22–5.81)
RDW: 13.2 % (ref 11.5–15.5)
WBC: 11.4 10*3/uL — ABNORMAL HIGH (ref 4.0–10.5)
nRBC: 0 % (ref 0.0–0.2)

## 2022-02-12 LAB — PROTIME-INR
INR: 1.2 (ref 0.8–1.2)
Prothrombin Time: 15.5 seconds — ABNORMAL HIGH (ref 11.4–15.2)

## 2022-02-12 LAB — URINE CULTURE: Culture: NO GROWTH

## 2022-02-12 LAB — TROPONIN I (HIGH SENSITIVITY): Troponin I (High Sensitivity): 52 ng/L — ABNORMAL HIGH (ref <18)

## 2022-02-12 LAB — TYPE AND SCREEN
ABO/RH(D): A POS
Antibody Screen: NEGATIVE

## 2022-02-12 MED ORDER — OXYBUTYNIN CHLORIDE 5 MG PO TABS
5.0000 mg | ORAL_TABLET | Freq: Two times a day (BID) | ORAL | Status: DC
  Administered 2022-02-14 – 2022-02-16 (×5): 5 mg via ORAL
  Filled 2022-02-12 (×12): qty 1

## 2022-02-12 MED ORDER — KCL IN DEXTROSE-NACL 10-5-0.45 MEQ/L-%-% IV SOLN
INTRAVENOUS | Status: AC
  Filled 2022-02-12 (×2): qty 1000

## 2022-02-12 MED ORDER — METOPROLOL TARTRATE 5 MG/5ML IV SOLN: 5.0000 mg | INTRAVENOUS | Status: DC | PRN

## 2022-02-12 MED ORDER — PRAVASTATIN SODIUM 40 MG PO TABS
40.0000 mg | ORAL_TABLET | Freq: Every day | ORAL | Status: DC
  Administered 2022-02-14 – 2022-02-16 (×3): 40 mg via ORAL
  Filled 2022-02-12 (×3): qty 1

## 2022-02-12 MED ORDER — MIDODRINE HCL 5 MG PO TABS
5.0000 mg | ORAL_TABLET | Freq: Three times a day (TID) | ORAL | Status: DC
Start: 2022-02-12 — End: 2022-02-16
  Administered 2022-02-12 – 2022-02-16 (×10): 5 mg via ORAL
  Filled 2022-02-12 (×11): qty 1

## 2022-02-12 MED ORDER — BUPIVACAINE-EPINEPHRINE (PF) 0.5% -1:200000 IJ SOLN
INTRAMUSCULAR | Status: DC | PRN
  Administered 2022-02-12: 30 mL via PERINEURAL

## 2022-02-12 MED ORDER — HALOPERIDOL LACTATE 5 MG/ML IJ SOLN
1.0000 mg | Freq: Four times a day (QID) | INTRAMUSCULAR | Status: DC | PRN
  Administered 2022-02-12: 1 mg via INTRAVENOUS
  Filled 2022-02-12: qty 1

## 2022-02-12 MED ORDER — VITAMIN D 25 MCG (1000 UNIT) PO TABS
2000.0000 [IU] | ORAL_TABLET | Freq: Every day | ORAL | Status: DC
  Administered 2022-02-14 – 2022-02-16 (×3): 2000 [IU] via ORAL
  Filled 2022-02-12 (×3): qty 2

## 2022-02-12 MED ORDER — THIAMINE HCL 100 MG PO TABS
100.0000 mg | ORAL_TABLET | Freq: Every day | ORAL | Status: DC
  Administered 2022-02-14 – 2022-02-16 (×3): 100 mg via ORAL
  Filled 2022-02-12 (×3): qty 1

## 2022-02-12 MED ORDER — ALLOPURINOL 100 MG PO TABS
100.0000 mg | ORAL_TABLET | Freq: Every day | ORAL | Status: DC
Start: 2022-02-13 — End: 2022-02-16
  Administered 2022-02-14 – 2022-02-16 (×3): 100 mg via ORAL
  Filled 2022-02-12 (×4): qty 1

## 2022-02-12 MED ORDER — BUDESONIDE 0.5 MG/2ML IN SUSP
0.5000 mg | Freq: Two times a day (BID) | RESPIRATORY_TRACT | Status: DC
  Administered 2022-02-13 – 2022-02-16 (×7): 0.5 mg via RESPIRATORY_TRACT
  Filled 2022-02-12 (×9): qty 2

## 2022-02-12 MED ORDER — ONDANSETRON HCL 4 MG/2ML IJ SOLN: 4.0000 mg | Freq: Four times a day (QID) | INTRAMUSCULAR | Status: DC | PRN

## 2022-02-12 MED ORDER — SENNOSIDES-DOCUSATE SODIUM 8.6-50 MG PO TABS
1.0000 | ORAL_TABLET | Freq: Every evening | ORAL | Status: DC | PRN
  Administered 2022-02-13: 1 via ORAL
  Filled 2022-02-12: qty 1

## 2022-02-12 MED ORDER — ASPIRIN EC 81 MG PO TBEC
81.0000 mg | DELAYED_RELEASE_TABLET | Freq: Every day | ORAL | Status: DC
  Administered 2022-02-14 – 2022-02-16 (×3): 81 mg via ORAL
  Filled 2022-02-12 (×3): qty 1

## 2022-02-12 MED ORDER — TAMSULOSIN HCL 0.4 MG PO CAPS
0.4000 mg | ORAL_CAPSULE | Freq: Every day | ORAL | Status: DC
Start: 2022-02-13 — End: 2022-02-16
  Administered 2022-02-14 – 2022-02-16 (×3): 0.4 mg via ORAL
  Filled 2022-02-12 (×3): qty 1

## 2022-02-12 MED ORDER — ACETAMINOPHEN 325 MG PO TABS: 650.0000 mg | ORAL_TABLET | Freq: Four times a day (QID) | ORAL | Status: DC | PRN

## 2022-02-12 MED ORDER — ALBUTEROL SULFATE (2.5 MG/3ML) 0.083% IN NEBU
2.5000 mg | INHALATION_SOLUTION | RESPIRATORY_TRACT | Status: DC | PRN
Start: 2022-02-12 — End: 2022-02-16

## 2022-02-12 MED ORDER — MAGNESIUM OXIDE -MG SUPPLEMENT 400 (240 MG) MG PO TABS
200.0000 mg | ORAL_TABLET | Freq: Every day | ORAL | Status: DC
  Administered 2022-02-14 – 2022-02-16 (×3): 200 mg via ORAL
  Filled 2022-02-12 (×3): qty 1

## 2022-02-12 MED ORDER — FENTANYL CITRATE (PF) 100 MCG/2ML IJ SOLN
INTRAMUSCULAR | Status: AC
  Filled 2022-02-12: qty 2

## 2022-02-12 MED ORDER — HYDRALAZINE HCL 20 MG/ML IJ SOLN: 10.0000 mg | Freq: Four times a day (QID) | INTRAMUSCULAR | Status: DC | PRN

## 2022-02-12 MED ORDER — LORAZEPAM 2 MG/ML IJ SOLN: 1.0000 mg | INTRAMUSCULAR | Status: DC | PRN

## 2022-02-12 MED ORDER — MORPHINE SULFATE (PF) 2 MG/ML IV SOLN
0.5000 mg | INTRAVENOUS | Status: DC | PRN
  Administered 2022-02-12 – 2022-02-13 (×2): 0.5 mg via INTRAVENOUS
  Filled 2022-02-12 (×2): qty 1

## 2022-02-12 MED ORDER — AMIODARONE HCL 200 MG PO TABS
200.0000 mg | ORAL_TABLET | Freq: Every day | ORAL | Status: DC
  Administered 2022-02-14 – 2022-02-16 (×3): 200 mg via ORAL
  Filled 2022-02-12 (×4): qty 1

## 2022-02-12 MED ORDER — MAGNESIUM OXIDE 400 (241.3 MG) MG PO TABS: 200.0000 mg | ORAL_TABLET | Freq: Every day | ORAL | Status: DC

## 2022-02-12 MED ORDER — FENTANYL CITRATE PF 50 MCG/ML IJ SOSY
25.0000 ug | PREFILLED_SYRINGE | Freq: Once | INTRAMUSCULAR | Status: AC
  Administered 2022-02-12: 25 ug via INTRAVENOUS
  Filled 2022-02-12: qty 1

## 2022-02-12 MED ORDER — LEVOTHYROXINE SODIUM 25 MCG PO TABS
125.0000 ug | ORAL_TABLET | Freq: Every day | ORAL | Status: DC
  Administered 2022-02-14 – 2022-02-16 (×3): 125 ug via ORAL
  Filled 2022-02-12 (×5): qty 1

## 2022-02-12 NOTE — Anesthesia Preprocedure Evaluation (Signed)
Anesthesia Evaluation  ? ? ?Reviewed: ?Allergy & Precautions, Patient's Chart, lab work & pertinent test results ? ?Airway ? ? ? ? ? ? ? Dental ?  ?Pulmonary ?COPD,  COPD inhaler, former smoker,  ?  ? ? ? ? ? ? ? Cardiovascular ?hypertension, + CAD, + Peripheral Vascular Disease and +CHF  ?+ Cardiac Defibrillator ? ? ? ?  ?Neuro/Psych ?negative neurological ROS ? negative psych ROS  ? GI/Hepatic ?  ?Endo/Other  ?Hypothyroidism  ? Renal/GU ?Renal disease  ? ?  ?Musculoskeletal ? ?(+) Arthritis ,  ? Abdominal ?  ?Peds ? Hematology ?  ?Anesthesia Other Findings ? ? Reproductive/Obstetrics ? ?  ? ? ? ? ? ? ? ? ? ? ? ? ? ?  ?  ? ? ? ? ? ? ? ? ?Anesthesia Physical ?Anesthesia Plan ? ?ASA: 3 ? ?Anesthesia Plan: Regional  ? ?Post-op Pain Management:   ? ?Induction:  ? ?PONV Risk Score and Plan: 0 ? ?Airway Management Planned: Natural Airway and Simple Face Mask ? ?Additional Equipment: None ? ?Intra-op Plan:  ? ?Post-operative Plan:  ? ?Informed Consent:  ? ?Plan Discussed with:  ? ?Anesthesia Plan Comments:   ? ? ? ? ? ? ?Anesthesia Quick Evaluation ? ?

## 2022-02-12 NOTE — ED Notes (Signed)
Got patient undressed into a gown on the monitor did ekg shown to er provider patient is resting with call bell in reach  

## 2022-02-12 NOTE — Telephone Encounter (Signed)
Looks like he is actually back in the ER as of 30 minutes ago and under evaluation. Hopefully they can be helpful in getting him cared for in interim and determine if higher level of care needed.  ?

## 2022-02-12 NOTE — Telephone Encounter (Signed)
Community message sent to [employer] stating home health orders were entered for the patient.  Abelina Bachelor responded and stated she will send the message to see if the patient can be accepted. ?

## 2022-02-12 NOTE — Progress Notes (Signed)
Orthopedic Tech Progress Note ?Patient Details:  ?Kaelem Brach ?[birth year] ?[id number] ? ?Patient ID: Durelle Zepeda, male   DOB: 1930/10/24, 88 y.o.   MRN: [id number] ?Patient cant have OHF due to age. ?Karolee Stamps ?02/12/2022, 10:02 PM ? ?

## 2022-02-12 NOTE — Consult Note (Signed)
Reason for Consult:Right hip fx ?Referring Physician: Regan Lemming ?Time called: 1543 ?Time at bedside: 1559 ? ? ?[name] is an 88 y.o. male.  ?HPI: [name] was at home in bed when family heard a thud and found him on the floor. It's unclear whether he fell out of bed or while trying to get up. EMS came and helped him back to bed but he was still c/o pain and was brought to the ED. CT showed a hip fx and orthopedic surgery was consulted. He had undergone dental work a week ago and has been declining ever since. He lives at home with the aid of a caregiver and ambulates both with and without a RW. ? ?Past Medical History:  ?Diagnosis Date  ? AAA (abdominal aortic aneurysm) ([place])   ? a. 2002 s/p repair.  ? Allergic rhinitis   ? Back pain   ? CAD (coronary artery disease)   ? a. 02/2002 H/o MI with stenting x 2; b. 04/2013 MV: EF 25%, large posterior lateral infarct w/o ischemia; c. 03/2016 VT Arrest/Cath: LM 60ost, LAD 40p/m ISR, LCX 100p/m, RCA nl-->Med Rx.  ? Carotid arterial disease ([place])   ? a. 12/2010 s/p R CEA;  b. 05/2015 Carotid U/S: bilat <40% ICA stenosis.  ? Chronic combined systolic and diastolic CHF (congestive heart failure) ([place])   ? a. 03/2016 Echo: Ef 35-40%, grade 1 DD.  ? Compression fracture   ? COPD (chronic obstructive pulmonary disease) ([place])   ? Dermatitis   ? Diverticulosis of colon   ? DJD (degenerative joint disease)   ? and Gout  ? Hemorrhoids   ? History of colonic polyps   ? History of gout   ? History of pneumonia   ? Hypercholesterolemia   ? Hypertension   ? Hypertensive heart disease   ? Ischemic cardiomyopathy   ? a. EF prev <35%-->improved to normal by Echo 8/14:  Mild LVH, focal basal hypertrophy, EF 60-65%, normal wall motion, mild BAE, PASP 36; c. 03/2016 Echo: EF 35-40%, Gr1 DD, triv AI, mild MR, mod dil LA, mild-mod TR, PASP 85mHg.  ? Osteoporosis   ? Peripheral vascular disease ([place]   ? Presence of cardiac defibrillator   ? a. 03/2008 s/p MDT [id number] Maximo II DR, DC AICD;  b. 03/2013: ICD shock for T wave oversensing;  c. 10/2015: collective decision not to replace ICD given improvement in LV fxn; d. VT Arrest-->Gen change to MDT ser # [id number].  ? Skin cancer   ? shoulders and forehead  ? Syncope   ? T wave over sensing resulting in inappropriate shocks   ? a. 03/2013.  ? Tobacco abuse   ? Ventricular tachycardia ([place] 04/01/2016  ? ? ?Past Surgical History:  ?Procedure Laterality Date  ? ABDOMINAL AORTIC ANEURYSM REPAIR  2002  ? by Dr. LKellie Simmering ? aicd placed  03/2008  ? Dr. KCaryl Comes ? cad stent  02/2002  ? Dr. HPercival Spanish ? CARDIAC CATHETERIZATION    ? X 2 stents  ? CARDIAC CATHETERIZATION N/A 04/03/2016  ? Procedure: Left Heart Cath and Coronary Angiography;  Surgeon: HBelva Crome MD;  [facility];  Service: Cardiovascular;  Laterality: N/A;  ? CARDIAC DEFIBRILLATOR PLACEMENT  2009  ? CARDIAC DEFIBRILLATOR PLACEMENT    ? CAROTID ENDARTERECTOMY Right January 02, 2011  ? EAR CYST EXCISION Left 12/13/2015  ? Procedure: Excision left ear lesion ;  Surgeon: JMelissa Montane MD;  [facility]  Service: ENT;  Laterality: Left;  ? EP IMPLANTABLE DEVICE N/A 04/06/2016  ? Procedure:  ICD Generator Changeout;  Surgeon: Deboraha Sprang, MD;  Location: [facility];  Service: Cardiovascular;  Laterality: N/A;  ? EXCISION OF LESION LEFT EAR Left 12/13/2015  ? EXTERNAL EAR SURGERY Left   ? Cancer Removal from left ear  ? I & D EXTREMITY Left 04/23/2016  ? Procedure: IRRIGATION AND DEBRIDEMENT HAND;  Surgeon: Leandrew Koyanagi, MD;  Location: [facility];  Service: Orthopedics;  Laterality: Left;  ? IR IMAGE GUIDED DRAINAGE BY PERCUTANEOUS CATHETER  10/08/2017  ? lumbar back    ? done after compression fracture  ? OPEN REDUCTION INTERNAL FIXATION (ORIF) METACARPAL Left 04/04/2016  ? Procedure: OPEN REDUCTION INTERNAL FIXATION (ORIF) LEFT 2ND, 3RD, 4TH METACARPAL FRACTURE;  Surgeon: Leandrew Koyanagi, MD;  Location: [facility];  Service: Orthopedics;  Laterality: Left;  OPEN REDUCTION INTERNAL FIXATION (ORIF)  LEFT 2ND, 3RD, 4TH METACARPAL FRACTURE  ? OPEN REDUCTION INTERNAL FIXATION (ORIF) METACARPAL Left 04/23/2016  ? Procedure: REVISION OPEN REDUCTION INTERNAL FIXATION (ORIF) 2ND METACARPAL;  Surgeon: Leandrew Koyanagi, MD;  Location: [facility];  Service: Orthopedics;  Laterality: Left;  ? right corotid enderectomy  12/2010  ? Dr. Scot Dock  ? SKIN SPLIT GRAFT Left 12/13/2015  ? Procedure: with possible skin graft;  Surgeon: Melissa Montane, MD;  Location: [facility];  Service: ENT;  Laterality: Left;  ? SQUAMOUS CELL CARCINOMA EXCISION    ? on back, forehead, arm  ? TONSILLECTOMY    ? ? ?Family History  ?Problem Relation Age of Onset  ? Hypertension Father   ? Heart disease Father 1  ?     Heart Disease before age 48  ? Alcohol abuse Father   ?     causing multiple medical problems; uncertain exact cause of death  ? Hypertension Mother   ? Heart disease Mother   ?     Heart Disease before age 100  ? Cancer Mother   ?     pancreatic  ? Scoliosis Sister   ? Thyroid disease Daughter   ? Anxiety disorder Son   ? ? ?Social History:  reports that he quit smoking about 13 years ago. His smoking use included cigarettes, cigars, and pipe. He has a 97.50 pack-year smoking history. He has never used smokeless tobacco. He reports current alcohol use of about 15.0 standard drinks per week. He reports that he does not use drugs. ? ?Allergies:  ?Allergies  ?Allergen Reactions  ? Lisinopril   ?  BP dropped too low per daughter  ? ? ?Medications: I have reviewed the patient's current medications. ? ?Results for orders placed or performed during the hospital encounter of 02/12/22 (from the past 48 hour(s))  ?Protime-INR     Status: Abnormal  ? Collection Time: 02/12/22  3:47 PM  ?Result Value Ref Range  ? Prothrombin Time 15.5 (H) 11.4 - 15.2 seconds  ? INR 1.2 0.8 - 1.2  ?  Comment: (NOTE) ?INR goal varies based on device and disease states. ?Performed at [facility], [place] [address], [place], [place] ?[address] ?  ? ?*Note: Due to a large number of  results and/or encounters for the requested time period, some results have not been displayed. A complete set of results can be found in Results Review.  ? ? ?CT Head Wo Contrast ? ?Result Date: 02/12/2022 ?CLINICAL DATA:  Head trauma, minor EXAM: CT HEAD WITHOUT CONTRAST CT CERVICAL SPINE WITHOUT CONTRAST TECHNIQUE: Multidetector  CT imaging of the head and cervical spine was performed following the standard protocol without intravenous contrast. Multiplanar CT image reconstructions of the cervical spine were also generated. RADIATION DOSE REDUCTION: This exam was performed according to the departmental dose-optimization program which includes automated exposure control, adjustment of the mA and/or kV according to patient size and/or use of iterative reconstruction technique. COMPARISON:  None. FINDINGS: CT HEAD FINDINGS Brain: No acute intracranial hemorrhage. No focal mass lesion. No CT evidence of acute infarction. No midline shift or mass effect. No hydrocephalus. Basilar cisterns are patent. There are periventricular and subcortical white matter hypodensities. Generalized cortical atrophy. Vascular: No hyperdense vessel or unexpected calcification. Skull: Normal. Negative for fracture or focal lesion. Sinuses/Orbits: Paranasal sinuses and mastoid air cells are clear. Orbits are clear. Other: None. CT CERVICAL SPINE FINDINGS Alignment: Is Skull base and vertebrae: Normal craniocervical junction. No loss of vertebral body height or disc height. Normal facet articulation. No evidence of fracture. Soft tissues and spinal canal: No prevertebral soft tissue swelling. No perispinal or epidural hematoma. Disc levels: Bulky osteophytosis anteriorly from C4 to C7. Multiple levels of uncal vertebral hypertrophy. Upper chest: Clear Other: None IMPRESSION: 1. No intracranial trauma. 2. Chronic atrophy and white matter microvascular disease. 3. No cervical spine fracture. 4. Multilevel disc osteophytic disease. Electronically  Signed   By: Suzy Bouchard M.D.   On: 02/12/2022 15:26  ? ?CT HEAD WO CONTRAST ? ?Result Date: 02/11/2022 ?CLINICAL DATA:  Provided history: Neuro deficit, acute, stroke suspected. EXAM: CT HEAD WITHOUT C

## 2022-02-12 NOTE — H&P (Addendum)
?                                                                                               ? ?                                                                                                       TRH H&P ? ? Patient Demographics:  ? ? [name], is a 88 y.o. male  MRN: [id number]   DOB - 07-Jul-1931 ? ?Admit Date - 02/12/2022 ? ?Outpatient Primary MD for the patient is Jesse Macadam, MD ? ?Outpatient Specialists: Dr [name]   ? ?Patient coming from: Home ? ?Chief Complaint  ?Patient presents with  ? Fall  ?  ? ? HPI:  ? ? [name]  is a 88 y.o. male, with H/O tobacco dependence - quit; AICD placement for NICM; PVD; HTN; HLD; COPD; chronic combined CHF; CAD s/p stenting; AAA s/p repair (2002); carotid disease, LGIB, age-related cognitive decline, recent urinary retention Foley catheter was placed yesterday in the ER, recent dental procedure 1 week ago after which he has been increasingly getting more confused at home for the last 1 week. ? ? ?Patient with above history who lives at home with a caregiver who recently had urinary retention yesterday requiring a ER visit and Foley catheter placement and has been increasingly confused since his dental procedure 1 week ago was brought in after a mechanical fall when he lost his balance getting out of the bed and post fall had right sided hip pain, he was brought to the ER where he was diagnosed with right hip fracture he was seen by orthopedics and at last team was requested to admit the patient. ? ?Currently is significantly confused most of the history has been provided by his caregiver who is bedside and daughter [name] over the phone, he currently denies any headache chest or abdominal pain, no shortness of breath.  He is overall currently a poor historian and unable to answer questions reliably or follow commands due to significant encephalopathy.  But appears to be in no  distress. ? ? Review of systems:  ?  ? ?A full 10 point Review of Systems was done, except as stated above, all other Review of Systems were negative. ? ? ?With Past History of the following :  ? ? ?Past Medical History:  ?Diagnosis Date  ? AAA (abdominal aortic aneurysm) ([place])   ? a. 2002 s/p repair.  ? Allergic rhinitis   ? Back pain   ? CAD (coronary artery disease)   ? a. 02/2002 H/o MI with stenting x 2; b. 04/2013 MV: EF 25%, large posterior lateral infarct w/o  ischemia; c. 03/2016 VT Arrest/Cath: LM 60ost, LAD 40p/m ISR, LCX 100p/m, RCA nl-->Med Rx.  ? Carotid arterial disease ([place])   ? a. 12/2010 s/p R CEA;  b. 05/2015 Carotid U/S: bilat <40% ICA stenosis.  ? Chronic combined systolic and diastolic CHF (congestive heart failure) ([place])   ? a. 03/2016 Echo: Ef 35-40%, grade 1 DD.  ? Compression fracture   ? COPD (chronic obstructive pulmonary disease) ([place])   ? Dermatitis   ? Diverticulosis of colon   ? DJD (degenerative joint disease)   ? and Gout  ? Hemorrhoids   ? History of colonic polyps   ? History of gout   ? History of pneumonia   ? Hypercholesterolemia   ? Hypertension   ? Hypertensive heart disease   ? Ischemic cardiomyopathy   ? a. EF prev <35%-->improved to normal by Echo 8/14:  Mild LVH, focal basal hypertrophy, EF 60-65%, normal wall motion, mild BAE, PASP 36; c. 03/2016 Echo: EF 35-40%, Gr1 DD, triv AI, mild MR, mod dil LA, mild-mod TR, PASP 37mHg.  ? Osteoporosis   ? Peripheral vascular disease ([place]   ? Presence of cardiac defibrillator   ? a. 03/2008 s/p MDT [id number] Maximo II DR, DC AICD; b. 03/2013: ICD shock for T wave oversensing;  c. 10/2015: collective decision not to replace ICD given improvement in LV fxn; d. VT Arrest-->Gen change to MDT ser # [id number].  ? Skin cancer   ? shoulders and forehead  ? Syncope   ? T wave over sensing resulting in inappropriate shocks   ? a. 03/2013.  ? Tobacco abuse   ? Ventricular tachycardia ([place] 04/01/2016  ?   ? ?Past Surgical History:  ?Procedure Laterality  Date  ? ABDOMINAL AORTIC ANEURYSM REPAIR  2002  ? by Dr. LKellie Simmering ? aicd placed  03/2008  ? Dr. KCaryl [name] ? cad stent  02/2002  ? Dr. HPercival Spanish ? CARDIAC CATHETERIZATION    ? X 2 stents  ? CARDIAC CATHETERIZATION N/A 04/03/2016  ? Procedure: Left Heart Cath and Coronary Angiography;  Surgeon: HBelva Crome MD;  [facility];  Service: Cardiovascular;  Laterality: N/A;  ? CARDIAC DEFIBRILLATOR PLACEMENT  2009  ? CARDIAC DEFIBRILLATOR PLACEMENT    ? CAROTID ENDARTERECTOMY Right January 02, 2011  ? EAR CYST EXCISION Left 12/13/2015  ? Procedure: Excision left ear lesion ;  Surgeon: JMelissa Montane MD;  [facility]  Service: ENT;  Laterality: Left;  ? EP IMPLANTABLE DEVICE N/A 04/06/2016  ? Procedure:  ICD Generator Changeout;  Surgeon: SDeboraha Sprang MD;  [facility];  Service: Cardiovascular;  Laterality: N/A;  ? EXCISION OF LESION LEFT EAR Left 12/13/2015  ? EXTERNAL EAR SURGERY Left   ? Cancer Removal from left ear  ? I & D EXTREMITY Left 04/23/2016  ? Procedure: IRRIGATION AND DEBRIDEMENT HAND;  Surgeon: NLeandrew Koyanagi MD;  [facility]  Service: Orthopedics;  Laterality: Left;  ? IR IMAGE GUIDED DRAINAGE BY PERCUTANEOUS CATHETER  10/08/2017  ? lumbar back    ? done after compression fracture  ? OPEN REDUCTION INTERNAL FIXATION (ORIF) METACARPAL Left 04/04/2016  ? Procedure: OPEN REDUCTION INTERNAL FIXATION (ORIF) LEFT 2ND, 3RD, 4TH METACARPAL FRACTURE;  Surgeon: NLeandrew Koyanagi MD;  [facility]  Service: Orthopedics;  Laterality: Left;  OPEN REDUCTION INTERNAL FIXATION (ORIF) LEFT 2ND, 3RD, 4TH METACARPAL FRACTURE  ? OPEN REDUCTION INTERNAL FIXATION (ORIF) METACARPAL Left 04/23/2016  ? Procedure: REVISION  OPEN REDUCTION INTERNAL FIXATION (ORIF) 2ND METACARPAL;  Surgeon: Leandrew Koyanagi, MD;  Location: [facility];  Service: Orthopedics;  Laterality: Left;  ? right corotid enderectomy  12/2010  ? Dr. Scot Dock  ? SKIN SPLIT GRAFT Left 12/13/2015  ? Procedure: with possible skin graft;  Surgeon: Melissa Montane, MD;  Location: [facility];  Service: ENT;  Laterality: Left;  ? SQUAMOUS CELL CARCINOMA EXCISION    ? on back, forehead, arm  ? TONSILLECTOMY    ? ? ? ? Social History:  ? ?  ?Social History  ? ?Tobacco Use  ? Smoking status: Former  ?  Packs/day: 1.50  ?  Years: 65.00  ?  Pack years: 97.50  ?  Types: Cigarettes, Cigars, Pipe  ?  Quit date: 03/13/2008  ?  Years since quitting: 13.9  ? Smokeless tobacco: Never  ?Substance Use Topics  ? Alcohol use: Yes  ?  Alcohol/week: 15.0 standard drinks  ?  Types: 7 Glasses of wine, 7 Cans of beer, 1 Standard drinks or equivalent per week  ?  Comment: 1 beer daily, and 1 margarita weekly   ?  ? ?  ? ? Family History :  ? ?  ?Family History  ?Problem Relation Age of Onset  ? Hypertension Father   ? Heart disease Father 94  ?     Heart Disease before age 59  ? Alcohol abuse Father   ?     causing multiple medical problems; uncertain exact cause of death  ? Hypertension Mother   ? Heart disease Mother   ?     Heart Disease before age 38  ? Cancer Mother   ?     pancreatic  ? Scoliosis Sister   ? Thyroid disease Daughter   ? Anxiety disorder Son   ? ?  ? ? Home Medications:  ? ?Prior to Admission medications   ?Medication Sig Start Date End Date Taking? Authorizing Provider  ?allopurinol (ZYLOPRIM) 100 MG tablet TAKE 1 TABLET ONCE DAILY. ?Patient taking differently: Take 100 mg by mouth daily. 07/18/21  Yes Minus Breeding, MD  ?amiodarone (PACERONE) 200 MG tablet TAKE 1 TABLET ('200MG'$ ) BY MOUTH 5 DAYS PER WEEK. ?Patient taking differently: Take 200 mg by mouth every Monday, Tuesday, Wednesday, Thursday, and Friday. 09/12/21  Yes Deboraha Sprang, MD  ?aspirin EC 81 MG tablet Take 81 mg by mouth daily.   Yes [provider]  ?budesonide (PULMICORT) 0.5 MG/2ML nebulizer solution Take 2 mLs (0.5 mg total) by nebulization 2 (two) times daily. 01/27/19  Yes Icard, Octavio Graves, DO  ?Cholecalciferol (VITAMIN D) 50 MCG (2000 UT) CAPS Take 2,000 Units by mouth daily.   Yes [provider]  ?fluticasone (CUTIVATE) 0.05 % cream Apply 1 application. topically 2 (two) times daily as needed (pain). 12/27/21  Yes [provider]  ?formoterol (PERFOROMIST) 20 MCG/2ML nebulizer solution Ta

## 2022-02-12 NOTE — ED Notes (Signed)
Pt transported to PACU for a hip block.  PT will return to ED room after procedure. ?

## 2022-02-12 NOTE — ED Triage Notes (Signed)
Patient presents to ed via [facility] from home states he had a mechanical fall last pm FD came to his house assisted him back to bed , states he wasn't hurting , this am family attempted to get him out of bed and he wasn't able to straighten his right leg. Right leg is bend at the knee and he can't straighten it out. Patient is alert oriented very [place] and doesn't have his hearing aids . Per ems when he fell last pm his aid got struck in his right ear and when they pulled it out his ear started bleeding a little   ?

## 2022-02-12 NOTE — Telephone Encounter (Signed)
Noted  

## 2022-02-12 NOTE — Telephone Encounter (Signed)
Spoke with the patient's daughter and informed her of the message below.   She stated the patient fell, went to the ER yesterday, had a catheter placed and she is concerned about his cognitive decline.  Stated the patient was advised to follow up with urology and has an appt on 5/10 and an appt was scheduled with Dr Ethlyn Gallery on 4/28 per the daughter's request.  Message sent to PCP. ?

## 2022-02-12 NOTE — ED Notes (Signed)
PT becoming a little combative and pulling at lines and foley.  Caregiver at bedside attempting to control his behaviors.  Haldol to be given imminently. ? ?

## 2022-02-12 NOTE — Anesthesia Procedure Notes (Signed)
Anesthesia Regional Block: Peng block  ? ?Pre-Anesthetic Checklist: , timeout performed,  Correct Patient, Correct Site, Correct Laterality,  Correct Procedure, Correct Position, site marked,  Risks and benefits discussed,  Surgical consent,  Pre-op evaluation,  At surgeon's request and post-op pain management ? ?Laterality: Right ? ?Prep: chloraprep     ?  ?Needles:  ?Injection technique: Single-shot ? ?Needle Type: Echogenic Stimulator Needle   ? ? ?Needle Length: 10cm  ?Needle Gauge: 20  ? ? ? ?Additional Needles: ? ? ?Procedures:,,,, ultrasound used (permanent image in chart),,    ?Narrative:  ?Start time: 02/12/2022 7:25 PM ?End time: 02/12/2022 7:35 PM ?Injection made incrementally with aspirations every 5 mL. ? ?Performed by: Personally  ?Anesthesiologist: Murvin Natal, MD ? ?Additional Notes: ?Functioning IV was confirmed and monitors were applied.  A timeout was performed. Sterile prep, hand hygiene and sterile gloves were used. A 162m 20ga BBraun echogenic stimulator needle was used. Negative aspiration and negative test dose prior to incremental administration of local anesthetic. The patient tolerated the procedure well. ? ?Ultrasound guidance: relevent anatomy identified, needle position confirmed, local anesthetic spread visualized around nerve(s), vascular puncture avoided.  Image printed for medical record.  ? ? ? ? ? ?

## 2022-02-12 NOTE — ED Notes (Signed)
PT has been agitated but Pt is resting currently. ?

## 2022-02-12 NOTE — ED Provider Notes (Addendum)
?[place] ?Provider Note ? ? ?CSN: [id number] ?Arrival date & time: 02/12/22  1348 ? ?  ? ?History ? ?Chief Complaint  ?Patient presents with  ? Fall  ? ? ?[name] is a 88 y.o. male who presents the emergency department after a mechanical fall.  Patient states that he was trying to get out of bed last night and his legs did not support him.  He fell on the ground, and is unsure if he struck his head.  He does not take blood thinners.  The fire department was initially called, and he was put back into bed.  He did not have any pain at that time.  This morning the family attempted to get him out of bed, and patient was unable to straighten his leg due to pain.  No chest pain, shortness of breath, dizziness. ? ?The history is provided by the patient and a relative. The history is limited by the condition of the patient.  ?Fall ? ? ?  ? ?Home Medications ?Prior to Admission medications   ?Medication Sig Start Date End Date Taking? Authorizing Provider  ?allopurinol (ZYLOPRIM) 100 MG tablet TAKE 1 TABLET ONCE DAILY. 07/18/21   Minus Breeding, MD  ?amiodarone (PACERONE) 200 MG tablet TAKE 1 TABLET ('200MG'$ ) BY MOUTH 5 DAYS PER WEEK. 09/12/21   Deboraha Sprang, MD  ?amoxicillin (AMOXIL) 875 MG tablet Take 875 mg by mouth 2 (two) times daily. 02/06/22   [provider]  ?aspirin EC 81 MG tablet Take 81 mg by mouth daily.    [provider]  ?budesonide (PULMICORT) 0.5 MG/2ML nebulizer solution Take 2 mLs (0.5 mg total) by nebulization 2 (two) times daily. 01/27/19   Garner Nash, DO  ?Cholecalciferol (VITAMIN D) 50 MCG (2000 UT) CAPS Take 2,000 Units by mouth daily.    [provider]  ?fluticasone (CUTIVATE) 0.05 % cream Apply topically 2 (two) times daily as needed. 12/27/21   [provider]  ?formoterol (PERFOROMIST) 20 MCG/2ML nebulizer solution Take 2 mLs (20 mcg total) by nebulization 2 (two) times daily. 12/10/18   Garner Nash,  DO  ?furosemide (LASIX) 20 MG tablet TAKE 1 TABLET ONCE DAILY. 08/22/21   Deboraha Sprang, MD  ?guaiFENesin (MUCINEX) 600 MG 12 hr tablet Take 600 mg by mouth in the morning, at noon, in the evening, and at bedtime.     [provider]  ?levothyroxine (SYNTHROID) 125 MCG tablet TAKE ONE TABLET BY MOUTH ONCE DAILY 01/22/22   Caren Macadam, MD  ?magnesium oxide (MAG-OX) 400 (241.3 Mg) MG tablet Take 0.5 tablets (200 mg total) by mouth daily. 04/27/16   Love, Ivan Anchors, PA-C  ?midodrine (PROAMATINE) 2.5 MG tablet TAKE 1 TABLET 3 TIMES DAILY WITH MEALS. 12/04/21   Deboraha Sprang, MD  ?Multiple Vitamin (MULTIVITAMIN ADULT PO) Take by mouth.    [provider]  ?Multiple Vitamins-Minerals (PRESERVISION AREDS 2 PO) Take 1 capsule by mouth daily.    [provider]  ?mupirocin ointment (BACTROBAN) 2 % Apply topically as needed. 04/18/21   [provider]  ?nystatin (MYCOSTATIN) 100000 UNIT/ML suspension Take 100,000 Units by mouth in the morning, at noon, in the evening, and at bedtime. 02/21/21   [provider]  ?oxybutynin (DITROPAN) 5 MG tablet TAKE ONE TABLET BY MOUTH TWICE DAILY 12/17/21   Koberlein, Andris Flurry C, MD  ?polyethylene glycol (MIRALAX / GLYCOLAX) 17 g packet Take 17 g by mouth daily.  [provider]  ?pravastatin (PRAVACHOL) 40 MG tablet TAKE 1 TABLET ONCE DAILY. 12/17/21   Caren Macadam, MD  ?revefenacin (YUPELRI) 175 MCG/3ML nebulizer solution Take 3 mLs (175 mcg total) by nebulization daily. 09/23/20   Garner Nash, DO  ?sertraline (ZOLOFT) 50 MG tablet TAKE ONE TABLET AT BEDTIME 12/17/21   Koberlein, Steele Berg, MD  ?tamsulosin (FLOMAX) 0.4 MG CAPS capsule Take 1 capsule (0.4 mg total) by mouth daily. 02/11/22 03/13/22  Dorie Rank, MD  ?thiamine 100 MG tablet Take 1 tablet (100 mg total) by mouth daily. 04/26/16   Love, Ivan Anchors, PA-C  ?triamcinolone cream (KENALOG) 0.1 % Apply topically 2 (two) times daily as needed. 12/27/21   [provider]   ?   ? ?Allergies    ?Lisinopril   ? ?Review of Systems   ?Review of Systems  ?Musculoskeletal:   ?     Right leg pain  ?All other systems reviewed and are negative. ? ?Physical Exam ?Updated Vital Signs ?BP 126/66   Pulse 61   Temp 98.6 ?F (37 ?C) (Oral)   Resp 10   Ht '5\' 11"'$  (1.803 m)   Wt 90.7 kg   SpO2 99%   BMI 27.89 kg/m?  ?Physical Exam ?Vitals and nursing note reviewed.  ?Constitutional:   ?   Appearance: Normal appearance.  ?HENT:  ?   Head: Normocephalic and atraumatic.  ?Eyes:  ?   Conjunctiva/sclera: Conjunctivae normal.  ?Cardiovascular:  ?   Pulses:     ?     Radial pulses are 2+ on the right side and 2+ on the left side.  ?     Posterior tibial pulses are 2+ on the right side and 2+ on the left side.  ?Pulmonary:  ?   Effort: Pulmonary effort is normal. No respiratory distress.  ?Musculoskeletal:  ?   Comments: Tenderness to palpation over the right hip. Patient holding right leg in external rotation and knee flexed. Unable to straight leg due to pain. Sensation in tact in all extremities.  Upper facial bruising scattered over the bilateral upper extremities, without focal tenderness.   ? ?No midline spinal tenderness, step-offs or crepitus.  ?Skin: ?   General: Skin is warm and dry.  ?Neurological:  ?   Mental Status: He is alert.  ?Psychiatric:     ?   Mood and Affect: Mood normal.     ?   Behavior: Behavior normal.  ? ? ?ED Results / Procedures / Treatments   ?Labs ?(all labs ordered are listed, but only abnormal results are displayed) ?Labs Reviewed  ?CBC WITH DIFFERENTIAL/PLATELET - Abnormal; Notable for the following components:  ?    Result Value  ? WBC 11.4 (*)   ? RBC 4.11 (*)   ? MCV 100.5 (*)   ? MCH 34.1 (*)   ? Neutro Abs 9.5 (*)   ? All other components within normal limits  ?COMPREHENSIVE METABOLIC PANEL - Abnormal; Notable for the following components:  ? Glucose, Bld 132 (*)   ? BUN 38 (*)   ? Creatinine, Ser 1.52 (*)   ? Total Bilirubin 1.5 (*)   ? GFR, Estimated 44 (*)   ?  All other components within normal limits  ?PROTIME-INR - Abnormal; Notable for the following components:  ? Prothrombin Time 15.5 (*)   ? All other components within normal limits  ?TROPONIN I (HIGH SENSITIVITY) - Abnormal; Notable for the following components:  ? Troponin I (High Sensitivity) 52 (*)   ?  All other components within normal limits  ?TYPE AND SCREEN  ? ? ?EKG ?EKG Interpretation ? ?Date/Time:  Monday February 12 2022 16:32:14 EDT ?Ventricular Rate:  64 ?PR Interval:  226 ?QRS Duration: 167 ?QT Interval:  488 ?QTC Calculation: 504 ?R Axis:   89 ?Text Interpretation: Sinus rhythm Prolonged PR interval Left atrial enlargement Right bundle branch block Inferior infarct, age indeterminate ST depr, consider ischemia, anterolateral lds Since last tracing of earlier today now in Sinus rhythm Otherwise no significant change Confirmed by Daleen Bo [phone]) on 02/12/2022 4:49:54 PM ? ?Radiology ?CT Head Wo Contrast ? ?Result Date: 02/12/2022 ?CLINICAL DATA:  Head trauma, minor EXAM: CT HEAD WITHOUT CONTRAST CT CERVICAL SPINE WITHOUT CONTRAST TECHNIQUE: Multidetector CT imaging of the head and cervical spine was performed following the standard protocol without intravenous contrast. Multiplanar CT image reconstructions of the cervical spine were also generated. RADIATION DOSE REDUCTION: This exam was performed according to the departmental dose-optimization program which includes automated exposure control, adjustment of the mA and/or kV according to patient size and/or use of iterative reconstruction technique. COMPARISON:  None. FINDINGS: CT HEAD FINDINGS Brain: No acute intracranial hemorrhage. No focal mass lesion. No CT evidence of acute infarction. No midline shift or mass effect. No hydrocephalus. Basilar cisterns are patent. There are periventricular and subcortical white matter hypodensities. Generalized cortical atrophy. Vascular: No hyperdense vessel or unexpected calcification. Skull: Normal. Negative  for fracture or focal lesion. Sinuses/Orbits: Paranasal sinuses and mastoid air cells are clear. Orbits are clear. Other: None. CT CERVICAL SPINE FINDINGS Alignment: Is Skull base and vertebrae: Normal cranioce

## 2022-02-12 NOTE — ED Notes (Signed)
Pt is still sleeping at this time ? ?

## 2022-02-13 ENCOUNTER — Inpatient Hospital Stay (HOSPITAL_COMMUNITY): Payer: Medicare Other | Admitting: Anesthesiology

## 2022-02-13 ENCOUNTER — Other Ambulatory Visit: Payer: Self-pay

## 2022-02-13 ENCOUNTER — Encounter (HOSPITAL_COMMUNITY): Payer: Self-pay | Admitting: Internal Medicine

## 2022-02-13 ENCOUNTER — Inpatient Hospital Stay (HOSPITAL_COMMUNITY): Payer: Medicare Other

## 2022-02-13 ENCOUNTER — Encounter (HOSPITAL_COMMUNITY)
Admission: EM | Disposition: A | Discharge status: Skilled Nursing Facility | Payer: Self-pay | Source: Home / Self Care | Attending: Internal Medicine

## 2022-02-13 ENCOUNTER — Telehealth: Payer: Self-pay | Admitting: Pulmonary Disease

## 2022-02-13 DIAGNOSIS — I509 Heart failure, unspecified: Secondary | ICD-10-CM

## 2022-02-13 DIAGNOSIS — I2510 Atherosclerotic heart disease of native coronary artery without angina pectoris: Secondary | ICD-10-CM

## 2022-02-13 DIAGNOSIS — I10 Essential (primary) hypertension: Secondary | ICD-10-CM

## 2022-02-13 DIAGNOSIS — E785 Hyperlipidemia, unspecified: Secondary | ICD-10-CM

## 2022-02-13 DIAGNOSIS — R4181 Age-related cognitive decline: Secondary | ICD-10-CM | POA: Diagnosis not present

## 2022-02-13 DIAGNOSIS — W19XXXA Unspecified fall, initial encounter: Secondary | ICD-10-CM

## 2022-02-13 DIAGNOSIS — S72001A Fracture of unspecified part of neck of right femur, initial encounter for closed fracture: Secondary | ICD-10-CM | POA: Diagnosis not present

## 2022-02-13 DIAGNOSIS — Z95810 Presence of automatic (implantable) cardiac defibrillator: Secondary | ICD-10-CM | POA: Diagnosis not present

## 2022-02-13 DIAGNOSIS — I5042 Chronic combined systolic (congestive) and diastolic (congestive) heart failure: Secondary | ICD-10-CM

## 2022-02-13 DIAGNOSIS — I110 Hypertensive heart disease with heart failure: Secondary | ICD-10-CM

## 2022-02-13 HISTORY — PX: INTRAMEDULLARY (IM) NAIL INTERTROCHANTERIC: SHX5875

## 2022-02-13 LAB — VITAMIN D 25 HYDROXY (VIT D DEFICIENCY, FRACTURES): Vit D, 25-Hydroxy: 44.01 ng/mL (ref 30–100)

## 2022-02-13 LAB — COMPREHENSIVE METABOLIC PANEL
ALT: 17 U/L (ref 0–44)
AST: 24 U/L (ref 15–41)
Albumin: 3.1 g/dL — ABNORMAL LOW (ref 3.5–5.0)
Alkaline Phosphatase: 66 U/L (ref 38–126)
Anion gap: 9 (ref 5–15)
BUN: 44 mg/dL — ABNORMAL HIGH (ref 8–23)
CO2: 21 mmol/L — ABNORMAL LOW (ref 22–32)
Calcium: 9 mg/dL (ref 8.9–10.3)
Chloride: 104 mmol/L (ref 98–111)
Creatinine, Ser: 1.71 mg/dL — ABNORMAL HIGH (ref 0.61–1.24)
GFR, Estimated: 38 mL/min — ABNORMAL LOW (ref >60)
Glucose, Bld: 146 mg/dL — ABNORMAL HIGH (ref 70–99)
Potassium: 3.5 mmol/L (ref 3.5–5.1)
Sodium: 134 mmol/L — ABNORMAL LOW (ref 135–145)
Total Bilirubin: 1.1 mg/dL (ref 0.3–1.2)
Total Protein: 6.5 g/dL (ref 6.5–8.1)

## 2022-02-13 LAB — CBC WITH DIFFERENTIAL/PLATELET
Abs Immature Granulocytes: 0.04 10*3/uL (ref 0.00–0.07)
Basophils Absolute: 0 10*3/uL (ref 0.0–0.1)
Basophils Relative: 0 %
Eosinophils Absolute: 0.1 10*3/uL (ref 0.0–0.5)
Eosinophils Relative: 1 %
HCT: 32.7 % — ABNORMAL LOW (ref 39.0–52.0)
Hemoglobin: 11.8 g/dL — ABNORMAL LOW (ref 13.0–17.0)
Immature Granulocytes: 0 %
Lymphocytes Relative: 9 %
Lymphs Abs: 0.8 10*3/uL (ref 0.7–4.0)
MCH: 35.4 pg — ABNORMAL HIGH (ref 26.0–34.0)
MCHC: 36.1 g/dL — ABNORMAL HIGH (ref 30.0–36.0)
MCV: 98.2 fL (ref 80.0–100.0)
Monocytes Absolute: 0.9 10*3/uL (ref 0.1–1.0)
Monocytes Relative: 9 %
Neutro Abs: 7.7 10*3/uL (ref 1.7–7.7)
Neutrophils Relative %: 81 %
Platelets: 99 10*3/uL — ABNORMAL LOW (ref 150–400)
RBC: 3.33 MIL/uL — ABNORMAL LOW (ref 4.22–5.81)
RDW: 13.2 % (ref 11.5–15.5)
WBC: 9.5 10*3/uL (ref 4.0–10.5)
nRBC: 0 % (ref 0.0–0.2)

## 2022-02-13 LAB — CREATININE, URINE, RANDOM: Creatinine, Urine: 287.95 mg/dL

## 2022-02-13 LAB — SURGICAL PCR SCREEN
MRSA, PCR: NEGATIVE
Staphylococcus aureus: NEGATIVE

## 2022-02-13 LAB — MAGNESIUM: Magnesium: 2.3 mg/dL (ref 1.7–2.4)

## 2022-02-13 LAB — OSMOLALITY, URINE: Osmolality, Ur: 547 mOsm/kg (ref 300–900)

## 2022-02-13 LAB — SODIUM, URINE, RANDOM: Sodium, Ur: <10 mmol/L

## 2022-02-13 LAB — OSMOLALITY: Osmolality: 299 mOsm/kg — ABNORMAL HIGH (ref 275–295)

## 2022-02-13 SURGERY — FIXATION, FRACTURE, INTERTROCHANTERIC, WITH INTRAMEDULLARY ROD
Anesthesia: General | Laterality: Right

## 2022-02-13 MED ORDER — ONDANSETRON HCL 4 MG/2ML IJ SOLN
INTRAMUSCULAR | Status: DC | PRN
  Administered 2022-02-13: 4 mg via INTRAVENOUS

## 2022-02-13 MED ORDER — DEXAMETHASONE SODIUM PHOSPHATE 10 MG/ML IJ SOLN
INTRAMUSCULAR | Status: AC
  Filled 2022-02-13: qty 1

## 2022-02-13 MED ORDER — PROPOFOL 10 MG/ML IV BOLUS
INTRAVENOUS | Status: AC
  Filled 2022-02-13: qty 20

## 2022-02-13 MED ORDER — PHENYLEPHRINE 80 MCG/ML (10ML) SYRINGE FOR IV PUSH (FOR BLOOD PRESSURE SUPPORT)
PREFILLED_SYRINGE | INTRAVENOUS | Status: DC | PRN
  Administered 2022-02-13: 160 ug via INTRAVENOUS
  Administered 2022-02-13: 80 ug via INTRAVENOUS
  Administered 2022-02-13 (×2): 160 ug via INTRAVENOUS
  Administered 2022-02-13 (×2): 320 ug via INTRAVENOUS
  Administered 2022-02-13: 80 ug via INTRAVENOUS

## 2022-02-13 MED ORDER — CEFAZOLIN SODIUM-DEXTROSE 2-4 GM/100ML-% IV SOLN
2.0000 g | INTRAVENOUS | Status: AC
Start: 2022-02-14 — End: 2022-02-13
  Administered 2022-02-13: 2 g via INTRAVENOUS
  Filled 2022-02-13: qty 100

## 2022-02-13 MED ORDER — ACETAMINOPHEN 10 MG/ML IV SOLN
INTRAVENOUS | Status: DC | PRN
  Administered 2022-02-13: 1000 mg via INTRAVENOUS

## 2022-02-13 MED ORDER — ROCURONIUM BROMIDE 10 MG/ML (PF) SYRINGE
PREFILLED_SYRINGE | INTRAVENOUS | Status: DC | PRN
  Administered 2022-02-13: 70 mg via INTRAVENOUS

## 2022-02-13 MED ORDER — ONDANSETRON HCL 4 MG/2ML IJ SOLN
4.0000 mg | Freq: Four times a day (QID) | INTRAMUSCULAR | Status: DC | PRN
Start: 2022-02-13 — End: 2022-02-16

## 2022-02-13 MED ORDER — DOCUSATE SODIUM 100 MG PO CAPS
100.0000 mg | ORAL_CAPSULE | Freq: Two times a day (BID) | ORAL | Status: DC
  Administered 2022-02-13 – 2022-02-16 (×6): 100 mg via ORAL
  Filled 2022-02-13 (×6): qty 1

## 2022-02-13 MED ORDER — HYDROCODONE-ACETAMINOPHEN 5-325 MG PO TABS
1.0000 | ORAL_TABLET | ORAL | Status: DC | PRN
  Administered 2022-02-14: 1 via ORAL
  Filled 2022-02-13: qty 1

## 2022-02-13 MED ORDER — ACETAMINOPHEN 325 MG PO TABS: 325.0000 mg | ORAL_TABLET | Freq: Four times a day (QID) | ORAL | Status: DC | PRN

## 2022-02-13 MED ORDER — ALBUMIN HUMAN 5 % IV SOLN: INTRAVENOUS | Status: DC | PRN

## 2022-02-13 MED ORDER — ORAL CARE MOUTH RINSE: 15.0000 mL | Freq: Once | OROMUCOSAL | Status: AC

## 2022-02-13 MED ORDER — FENTANYL CITRATE (PF) 250 MCG/5ML IJ SOLN
INTRAMUSCULAR | Status: DC | PRN
  Administered 2022-02-13 (×2): 25 ug via INTRAVENOUS

## 2022-02-13 MED ORDER — DEXAMETHASONE SODIUM PHOSPHATE 10 MG/ML IJ SOLN
INTRAMUSCULAR | Status: DC | PRN
  Administered 2022-02-13: 10 mg via INTRAVENOUS

## 2022-02-13 MED ORDER — 0.9 % SODIUM CHLORIDE (POUR BTL) OPTIME
TOPICAL | Status: DC | PRN
  Administered 2022-02-13: 1000 mL

## 2022-02-13 MED ORDER — SUGAMMADEX SODIUM 200 MG/2ML IV SOLN
INTRAVENOUS | Status: DC | PRN
  Administered 2022-02-13: 200 mg via INTRAVENOUS

## 2022-02-13 MED ORDER — LACTATED RINGERS IV BOLUS
500.0000 mL | Freq: Once | INTRAVENOUS | Status: AC
  Administered 2022-02-13: 500 mL via INTRAVENOUS

## 2022-02-13 MED ORDER — TRANEXAMIC ACID-NACL 1000-0.7 MG/100ML-% IV SOLN
1000.0000 mg | INTRAVENOUS | Status: AC
  Administered 2022-02-13: 1000 mg via INTRAVENOUS

## 2022-02-13 MED ORDER — ACETAMINOPHEN 10 MG/ML IV SOLN
INTRAVENOUS | Status: AC
  Filled 2022-02-13: qty 100

## 2022-02-13 MED ORDER — MORPHINE SULFATE (PF) 2 MG/ML IV SOLN: 0.5000 mg | INTRAVENOUS | Status: DC | PRN

## 2022-02-13 MED ORDER — LIDOCAINE 2% (20 MG/ML) 5 ML SYRINGE
INTRAMUSCULAR | Status: DC | PRN
  Administered 2022-02-13: 40 mg via INTRAVENOUS

## 2022-02-13 MED ORDER — CHLORHEXIDINE GLUCONATE 0.12 % MT SOLN
OROMUCOSAL | Status: AC
  Filled 2022-02-13: qty 15

## 2022-02-13 MED ORDER — METOCLOPRAMIDE HCL 5 MG/ML IJ SOLN: 5.0000 mg | Freq: Three times a day (TID) | INTRAMUSCULAR | Status: DC | PRN

## 2022-02-13 MED ORDER — MENTHOL 3 MG MT LOZG: 1.0000 | LOZENGE | OROMUCOSAL | Status: DC | PRN

## 2022-02-13 MED ORDER — FENTANYL CITRATE (PF) 250 MCG/5ML IJ SOLN
INTRAMUSCULAR | Status: AC
  Filled 2022-02-13: qty 5

## 2022-02-13 MED ORDER — FENTANYL CITRATE (PF) 100 MCG/2ML IJ SOLN: 25.0000 ug | INTRAMUSCULAR | Status: DC | PRN

## 2022-02-13 MED ORDER — PROPOFOL 10 MG/ML IV BOLUS
INTRAVENOUS | Status: DC | PRN
Start: 2022-02-13 — End: 2022-02-13
  Administered 2022-02-13: 80 mg via INTRAVENOUS

## 2022-02-13 MED ORDER — PHENOL 1.4 % MT LIQD: 1.0000 | OROMUCOSAL | Status: DC | PRN

## 2022-02-13 MED ORDER — CEFAZOLIN SODIUM-DEXTROSE 2-4 GM/100ML-% IV SOLN
2.0000 g | Freq: Four times a day (QID) | INTRAVENOUS | Status: AC
  Administered 2022-02-13 – 2022-02-14 (×2): 2 g via INTRAVENOUS
  Filled 2022-02-13 (×2): qty 100

## 2022-02-13 MED ORDER — POVIDONE-IODINE 10 % EX SWAB: 2.0000 "application " | Freq: Once | CUTANEOUS | Status: DC

## 2022-02-13 MED ORDER — CHLORHEXIDINE GLUCONATE 4 % EX LIQD
60.0000 mL | Freq: Once | CUTANEOUS | Status: DC
  Filled 2022-02-13: qty 60

## 2022-02-13 MED ORDER — LIDOCAINE 2% (20 MG/ML) 5 ML SYRINGE
INTRAMUSCULAR | Status: AC
  Filled 2022-02-13: qty 5

## 2022-02-13 MED ORDER — VASOPRESSIN 20 UNIT/ML IV SOLN
INTRAVENOUS | Status: AC
  Filled 2022-02-13: qty 1

## 2022-02-13 MED ORDER — LACTATED RINGERS IV SOLN: INTRAVENOUS | Status: DC

## 2022-02-13 MED ORDER — ONDANSETRON HCL 4 MG PO TABS: 4.0000 mg | ORAL_TABLET | Freq: Four times a day (QID) | ORAL | Status: DC | PRN

## 2022-02-13 MED ORDER — TRANEXAMIC ACID-NACL 1000-0.7 MG/100ML-% IV SOLN
INTRAVENOUS | Status: AC
  Filled 2022-02-13: qty 100

## 2022-02-13 MED ORDER — ROCURONIUM BROMIDE 10 MG/ML (PF) SYRINGE
PREFILLED_SYRINGE | INTRAVENOUS | Status: AC
  Filled 2022-02-13: qty 10

## 2022-02-13 MED ORDER — ONDANSETRON HCL 4 MG/2ML IJ SOLN
INTRAMUSCULAR | Status: AC
  Filled 2022-02-13: qty 2

## 2022-02-13 MED ORDER — ACETAMINOPHEN 500 MG PO TABS
500.0000 mg | ORAL_TABLET | Freq: Three times a day (TID) | ORAL | Status: DC
  Administered 2022-02-13 – 2022-02-16 (×8): 500 mg via ORAL
  Filled 2022-02-13 (×9): qty 1

## 2022-02-13 MED ORDER — ACETAMINOPHEN 500 MG PO TABS
1000.0000 mg | ORAL_TABLET | Freq: Once | ORAL | Status: DC
  Filled 2022-02-13: qty 2

## 2022-02-13 MED ORDER — VASOPRESSIN 20 UNIT/ML IV SOLN
INTRAVENOUS | Status: DC | PRN
  Administered 2022-02-13: 1 [IU] via INTRAVENOUS

## 2022-02-13 MED ORDER — METOCLOPRAMIDE HCL 5 MG PO TABS: 5.0000 mg | ORAL_TABLET | Freq: Three times a day (TID) | ORAL | Status: DC | PRN

## 2022-02-13 MED ORDER — CHLORHEXIDINE GLUCONATE 0.12 % MT SOLN
15.0000 mL | Freq: Once | OROMUCOSAL | Status: AC
  Administered 2022-02-13: 15 mL via OROMUCOSAL
  Filled 2022-02-13: qty 15

## 2022-02-13 MED ORDER — EPHEDRINE SULFATE-NACL 50-0.9 MG/10ML-% IV SOSY
PREFILLED_SYRINGE | INTRAVENOUS | Status: DC | PRN
  Administered 2022-02-13 (×2): 10 mg via INTRAVENOUS
  Administered 2022-02-13: 5 mg via INTRAVENOUS
  Administered 2022-02-13: 10 mg via INTRAVENOUS

## 2022-02-13 MED ORDER — CHLORHEXIDINE GLUCONATE CLOTH 2 % EX PADS
6.0000 | MEDICATED_PAD | Freq: Every day | CUTANEOUS | Status: DC
  Administered 2022-02-13 – 2022-02-16 (×4): 6 via TOPICAL

## 2022-02-13 MED ORDER — PHENYLEPHRINE HCL-NACL 20-0.9 MG/250ML-% IV SOLN
INTRAVENOUS | Status: DC | PRN
Start: 2022-02-13 — End: 2022-02-13
  Administered 2022-02-13: 50 ug/min via INTRAVENOUS

## 2022-02-13 MED ORDER — LACTATED RINGERS IV SOLN: INTRAVENOUS | Status: DC | PRN

## 2022-02-13 MED ORDER — ENOXAPARIN SODIUM 40 MG/0.4ML IJ SOSY: 40.0000 mg | PREFILLED_SYRINGE | INTRAMUSCULAR | Status: DC

## 2022-02-13 SURGICAL SUPPLY — 54 items
BAG COUNTER SPONGE SURGICOUNT (BAG) ×2 IMPLANT
BAG SPNG CNTER NS LX DISP (BAG) ×1
BIT DRILL CANN LG 4.3MM (BIT) IMPLANT
BNDG COHESIVE 6X5 TAN STRL LF (GAUZE/BANDAGES/DRESSINGS) ×1 IMPLANT
BRUSH SCRUB EZ PLAIN DRY (MISCELLANEOUS) ×4 IMPLANT
COVER PERINEAL POST (MISCELLANEOUS) ×2 IMPLANT
COVER SURGICAL LIGHT HANDLE (MISCELLANEOUS) ×4 IMPLANT
DRAPE C-ARMOR (DRAPES) ×2 IMPLANT
DRAPE HALF SHEET 40X57 (DRAPES) IMPLANT
DRAPE ORTHO SPLIT 77X108 STRL (DRAPES)
DRAPE STERI IOBAN 125X83 (DRAPES) ×2 IMPLANT
DRAPE SURG ORHT 6 SPLT 77X108 (DRAPES) IMPLANT
DRAPE U-SHAPE 47X51 STRL (DRAPES) ×2 IMPLANT
DRESSING MEPILEX FLEX 4X4 (GAUZE/BANDAGES/DRESSINGS) IMPLANT
DRILL BIT CANN LG 4.3MM (BIT) ×2
DRSG EMULSION OIL 3X3 NADH (GAUZE/BANDAGES/DRESSINGS) ×2 IMPLANT
DRSG MEPILEX BORDER 4X4 (GAUZE/BANDAGES/DRESSINGS) ×2 IMPLANT
DRSG MEPILEX BORDER 4X8 (GAUZE/BANDAGES/DRESSINGS) ×2 IMPLANT
DRSG MEPILEX FLEX 4X4 (GAUZE/BANDAGES/DRESSINGS) ×6
ELECT REM PT RETURN 9FT ADLT (ELECTROSURGICAL) ×2
ELECTRODE REM PT RTRN 9FT ADLT (ELECTROSURGICAL) ×1 IMPLANT
GLOVE BIO SURGEON STRL SZ7.5 (GLOVE) ×2 IMPLANT
GLOVE BIO SURGEON STRL SZ8 (GLOVE) ×2 IMPLANT
GLOVE BIOGEL PI IND STRL 7.5 (GLOVE) ×1 IMPLANT
GLOVE BIOGEL PI IND STRL 8 (GLOVE) ×1 IMPLANT
GLOVE BIOGEL PI INDICATOR 7.5 (GLOVE) ×1
GLOVE BIOGEL PI INDICATOR 8 (GLOVE) ×1
GLOVE SURG ORTHO LTX SZ7.5 (GLOVE) ×4 IMPLANT
GOWN STRL REUS W/ TWL LRG LVL3 (GOWN DISPOSABLE) ×2 IMPLANT
GOWN STRL REUS W/ TWL XL LVL3 (GOWN DISPOSABLE) ×1 IMPLANT
GOWN STRL REUS W/TWL LRG LVL3 (GOWN DISPOSABLE) ×4
GOWN STRL REUS W/TWL XL LVL3 (GOWN DISPOSABLE) ×2
GUIDEPIN VERSANAIL DSP 3.2X444 (ORTHOPEDIC DISPOSABLE SUPPLIES) ×2 IMPLANT
KIT BASIN OR (CUSTOM PROCEDURE TRAY) ×2 IMPLANT
KIT TURNOVER KIT B (KITS) ×2 IMPLANT
MANIFOLD NEPTUNE II (INSTRUMENTS) ×2 IMPLANT
NAIL HIP FRACT 130D 11X180 (Screw) ×1 IMPLANT
NS IRRIG 1000ML POUR BTL (IV SOLUTION) ×2 IMPLANT
PACK GENERAL/GYN (CUSTOM PROCEDURE TRAY) ×2 IMPLANT
PAD ARMBOARD 7.5X6 YLW CONV (MISCELLANEOUS) ×4 IMPLANT
SCREW BONE CORTICAL 5.0X38 (Screw) ×1 IMPLANT
SCREW LAG 10.5MMX105MM HFN (Screw) ×1 IMPLANT
STAPLER VISISTAT 35W (STAPLE) ×2 IMPLANT
STOCKINETTE IMPERVIOUS LG (DRAPES) IMPLANT
SUT ETHILON 2 0 FS 18 (SUTURE) ×3 IMPLANT
SUT VIC AB 0 CT1 27 (SUTURE) ×2
SUT VIC AB 0 CT1 27XBRD ANBCTR (SUTURE) ×1 IMPLANT
SUT VIC AB 1 CT1 27 (SUTURE) ×2
SUT VIC AB 1 CT1 27XBRD ANBCTR (SUTURE) ×1 IMPLANT
SUT VIC AB 2-0 CT1 27 (SUTURE) ×2
SUT VIC AB 2-0 CT1 TAPERPNT 27 (SUTURE) ×1 IMPLANT
TOWEL GREEN STERILE (TOWEL DISPOSABLE) ×4 IMPLANT
TOWEL GREEN STERILE FF (TOWEL DISPOSABLE) ×2 IMPLANT
WATER STERILE IRR 1000ML POUR (IV SOLUTION) ×2 IMPLANT

## 2022-02-13 NOTE — Telephone Encounter (Signed)
Patient has no upcoming OV scheduled and is currently admitted at [facility].  ?I called [facility] and spoke with [place]. [place] stated patient will need OV notes faxed to [phone] for yupelri refills. [place] stated she had reached to patient and left message about needed OV for future refills.  I made [place] aware patient is currently admitted and we would follow up with follow up after patient has been discharged. ? ?Message routed to [facility], CMA to follow up with Dr. Valeta Harms ?

## 2022-02-13 NOTE — TOC CAGE-AID Note (Signed)
Transition of Care (TOC) - CAGE-AID Screening ? ? ?Patient Details  ?Name: [name] ?MRN: [id number] ?Date of Birth: 1930-12-31 ? ?Transition of Care (TOC) CM/SW Contact:    ?Emir Nack C Tarpley-Carter, LCSWA ?Phone Number: ?02/13/2022, 11:02 AM ? ? ?Clinical Narrative: ?Pt is unable to participate in Cage Aid. Pt is confused and alert x1.  CSW will attempt to assess at a better time. ? ?Passenger transport manager, MSW, LCSW-A ?Pronouns:  She/Her/Hers ?[facility]itions of Care ?Clinical Social Worker ?Direct Number:  [phone] ?[email or web address] ? ?CAGE-AID Screening: ?Substance Abuse Screening unable to be completed due to: : Patient unable to participate ? ?  ?  ?  ?  ?  ? ?  ? ?  ? ? ? ? ? ? ?

## 2022-02-13 NOTE — ED Notes (Signed)
Pt bed changed and restraints taken off and pt resting at this time. ?

## 2022-02-13 NOTE — Anesthesia Preprocedure Evaluation (Addendum)
Anesthesia Evaluation  ?Patient identified by MRN, date of birth, ID band ?Patient awake ? ? ? ?Reviewed: ?Allergy & Precautions, NPO status , Patient's Chart, lab work & pertinent test results ? ?Airway ?Mallampati: II ? ?TM Distance: >3 FB ?Neck ROM: Full ? ? ? Dental ? ?(+) Poor Dentition, Dental Advisory Given, Missing ?  ?Pulmonary ?COPD,  COPD inhaler, former smoker,  ?  ?Pulmonary exam normal ?breath sounds clear to auscultation ? ? ? ? ? ? Cardiovascular ?hypertension, + CAD, + Cardiac Stents, + Peripheral Vascular Disease (AAA s/p repair 2002, right CEA 2012) and +CHF  ?Normal cardiovascular exam+ dysrhythmias Ventricular Tachycardia + Cardiac Defibrillator ? ?Rhythm:Regular Rate:Normal ? ?TTE 2021 ??1. Diffuse hypokinesis with apical akinesis. Left ventricular ejection  ?fraction, by estimation, is 30 to 35%. The left ventricle has moderately  ?decreased function. The left ventricle demonstrates global hypokinesis.  ?Left ventricular diastolic parameters  ??are consistent with Grade I diastolic dysfunction (impaired relaxation).  ??2. Right ventricular systolic function is normal. The right ventricular  ?size is normal. There is normal pulmonary artery systolic pressure.  ??3. Left atrial size was severely dilated.  ??4. Right atrial size was mildly dilated.  ??5. The mitral valve is normal in structure. Trivial mitral valve  ?regurgitation. No evidence of mitral stenosis.  ??6. The aortic valve is normal in structure. There is mild calcification  ?of the aortic valve. There is mild thickening of the aortic valve. Aortic  ?valve regurgitation is mild. Mild aortic valve stenosis. Aortic valve  ?area, by VTI measures 1.33 cm?Marland Kitchen  ?Aortic valve mean gradient measures 13.5 mmHg. Aortic valve Vmax measures  ?2.54 m/s.  ??7. Aortic dilatation noted. There is mild dilatation of the ascending  ?aorta, measuring 39 mm. There is moderate dilatation at the level of the  ?sinuses of  Valsalva, measuring 46 mm.  ??8. The inferior vena cava is normal in size with greater than 50%  ?respiratory variability, suggesting right atrial pressure of 3 mmHg ? ?Cath 2017 ?1. Prox Cx to Mid Cx lesion, 100% stenosed. ?2. Prox LAD to Mid LAD lesion, 40% stenosed. The lesion was previously treated with a stent (unknown type). ?3. 4th Mrg lesion, 80% stenosed. ?4. Ost LM lesion, 60% stenosed. ? ?  ?Neuro/Psych ?negative neurological ROS ? negative psych ROS  ? GI/Hepatic ?negative GI ROS, Neg liver ROS,   ?Endo/Other  ?Hypothyroidism  ? Renal/GU ?Renal InsufficiencyRenal diseaseLab Results ?     Component                Value               Date                 ?     CREATININE               1.71 (H)            02/13/2022           ?     BUN                      44 (H)              02/13/2022           ?     NA                       134 (L)  02/13/2022           ?     K                        3.5                 02/13/2022           ?     CL                       104                 02/13/2022           ?     CO2                      21 (L)              02/13/2022           ?  ?negative genitourinary ?  ?Musculoskeletal ? ?(+) Arthritis ,  ? Abdominal ?  ?Peds ? Hematology ?negative hematology ROS ?(+) Lab Results ?     Component                Value               Date                 ?     WBC                      9.5                 02/13/2022           ?     HGB                      11.8 (L)            02/13/2022           ?     HCT                      32.7 (L)            02/13/2022           ?     MCV                      98.2                02/13/2022           ?     PLT                      99 (L)              02/13/2022           ?   ?Anesthesia Other Findings ? ? Reproductive/Obstetrics ? ?  ? ? ? ? ? ? ? ? ? ? ? ? ? ?  ?  ? ? ? ? ? ? ? ?Anesthesia Physical ?Anesthesia Plan ? ?ASA: 3 ? ?Anesthesia Plan: General  ? ?Post-op Pain Management: Ofirmev IV (intra-op)*  ? ?Induction:  Intravenous ? ?PONV Risk Score and Plan: 2 and Dexamethasone, Ondansetron and Treatment may vary due to age or medical condition ? ?  Airway Management Planned: Oral ETT ? ?Additional Equipment:  ? ?Intra-op Plan:  ? ?Post-operative Plan: Extubation in OR ? ?Informed Consent: I have reviewed the patients History and Physical, chart, labs and discussed the procedure including the risks, benefits and alternatives for the proposed anesthesia with the patient or authorized representative who has indicated his/her understanding and acceptance.  ? ? ? ?Dental advisory given ? ?Plan Discussed with: CRNA ? ?Anesthesia Plan Comments:   ? ? ? ? ? ?Anesthesia Quick Evaluation ? ?

## 2022-02-13 NOTE — Progress Notes (Signed)
?                                  PROGRESS NOTE                                             ?                                                                                                                     ?                                         ? ? Patient Demographics:  ? ? [name], is a 88 y.o. male, DOB - 1931-05-11, LOV:[id number] ? ?Outpatient Primary MD for the patient is Caren Macadam, MD    LOS - 1  Admit date - 02/12/2022   ? ?Chief Complaint  ?Patient presents with  ? Fall  ?    ? ?Brief Narrative (HPI from H&P)  - 88 y.o. male, with H/O tobacco dependence - quit; AICD placement for NICM; PVD; HTN; HLD; COPD; chronic combined CHF; CAD s/p stenting; AAA s/p repair (2002); carotid disease, LGIB, age-related cognitive decline, recent urinary retention Foley catheter was placed yesterday in the ER, recent dental procedure 1 week ago after which he has been increasingly getting more confused at home for the last 1 week.  Mechanical fall at home fractured his right hip and was admitted to the hospital for right hip fracture. ? ? Subjective:  ? ? [name] today has, No headache, No chest pain, No abdominal pain - No Nausea, No new weakness tingling or numbness, no SOB. ? ? Assessment  & Plan :  ? ?1.  Mechanical fall with CT evidence of Comminuted and displaced right intertrochanteric fracture with varus angulation. Additional, probable subcapital right femoral neck fracture which is nondisplaced.  Will be admitted to the hospital orthopedics has seen the patient, n.p.o. after midnight, proceed to surgery with the full understanding that he is a moderate to high risk candidate for adverse cardiopulmonary outcome during perioperative. Daughter [name] understands and accepts the risk.  Agrees for transfusion if needed during perioperative.  Nerve block during surgery if possible.  Full code on the day of surgery thereafter DNR. ?  ?Cardio-Pulm Risk stratification for  surgery and recommendations to minimize the same:- ?  ?Recommendations for optimizing Cardio-Pulmonary  Risk risk factors ?  ?1. Keep SBP<140, HR<85, use Lopressor '5mg'$  IV q4hrs PRN, or B.Blocker drip PRN. ?2. Moniotr I&Os. ?3. Minimal sedation and Narcotics. ?4. Good pulmunary toilet. ?5. PRN Nebs and as needed oxygen to keep Pox>90% ?6. Hb>8, transfuse as needed- Lasix '10mg'$  IV after each unit PRBC Transfused. ?  ?  ?  B.Bleeding Risk - no previous surgical complications, no easy bruising,  Antiplate meds baby aspirin daily. ? ?Lab Results  ?Component Value Date  ? INR 1.2 02/12/2022  ? INR 1.1 02/11/2022  ? INR 1.17 07/23/2018  ? ? ?Lab Results  ?Component Value Date  ? PLT 99 (L) 02/13/2022  ? ? ? ? 2.  Chronic combined systolic and diastolic CHF last EF 81%.  He follows with Dr. Caryl Comes, currently compensated, blood pressure too low and Coreg has been discontinued in the outpatient setting, continue amiodarone, CKD and low blood pressure preclude addition of ACE/ARB, keep SBP under good control possibly under 150, as needed IV hydralazine added, monitor intake and output closely.  Avoid overhydration.   ?  ?3.  CAD with chronic ischemic cardiomyopathy EF 30% history of V. tach.  Continue aspirin and statin, no acute issues.  Chest pain-free.  EKG has chronic changes.  He has AICD, on amiodarone which will be continued, follows with Dr. Caryl Comes. ?  ?4. HX of chronic hypotension.  Continue midodrine. ?  ?5. Hypothyroidism.  On Synthroid continue. ?  ?6.  History of COPD.  Stable no acute issues as needed oxygen, keep pulse ox over 92% due to underlying history of CAD and CHF, good pulmonary toiletry, as needed nebulizer treatments. ?  ?7.  History of age-related cognitive decline.  Increasing delirium and metabolic encephalopathy at home lately.  Expect to get worse, as needed Haldol, minimize narcotics, strictly avoid benzodiazepines, safety restraints if needed.  High risk for fall and worsening  encephalopathy/delirium.  Family member [place] and bedside caregiver made aware.  Will involve PT, OT and speech postsurgery ?  ?8.  Recent urinary retention and Foley catheter placement in the ER few days ago.  Continue Foley, flush every shift and monitor for signs of any obstruction, continue prostate medications, outpatient urology follow-up postdischarge ?  ?9.  Dyslipidemia.  Continue home dose statin. ?  ?10. 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 ? ?   ? ?Condition - Extremely Guarded ? ?Family Communication  :  daughter [name] [phone] on 02/12/22, 02/13/22 ? ?Code Status :  Full for today, DNR from 02/14/22 ? ?Consults  :  Ortho ? ?PUD Prophylaxis : PPI ? ? Procedures  :    ? ?CT head and C-spine.  Nonacute. ? ?CT of the right hip - Comminuted and displaced right intertrochanteric fracture with varus angulation. Additional, probable subcapital right femoral neck fracture which is nondisplaced. ? ?   ? ?Disposition Plan  :   ? ?Status is: Inpatient ? ?DVT Prophylaxis  :   ? ?SCDs Start: 02/12/22 1739 ?Place and maintain sequential compression device Start: 02/12/22 1707 ?Place TED hose Start: 02/12/22 1707 ? ? ?Lab Results  ?Component Value Date  ? PLT 99 (L) 02/13/2022  ? ? ?Diet :  ?Diet Order   ? ?       ?  Diet NPO time specified  Diet effective now       ?  ? ?  ?  ? ?  ?  ? ?Inpatient Medications ? ?Scheduled Meds: ? allopurinol  100 mg Oral Daily  ? amiodarone  200 mg Oral Daily  ? aspirin EC  81 mg Oral Daily  ? budesonide  0.5 mg Nebulization BID  ? cholecalciferol  2,000  Units Oral Daily  ? levothyroxine  125 mcg Oral Daily  ? magnesium oxide  200 mg Oral Daily  ? midodrine  5 mg Oral TID WC  ? oxybutynin  5 mg Oral BID  ? pravastatin  40 mg Oral Daily  ? tamsulosin  0.4 mg Oral Daily   ? thiamine  100 mg Oral Daily  ? ?Continuous Infusions: ? dextrose 5 % and 0.45 % NaCl with KCl 10 mEq/L 50 mL/hr at 02/13/22 0736  ? ?PRN Meds:.acetaminophen, albuterol, haloperidol lactate, hydrALAZINE, metoprolol tartrate, morphine injection, ondansetron (ZOFRAN) IV, senna-docusate ? ?Antibiotics  :   ? ?Anti-infectives (From admission, onward)  ? ? None  ? ?  ? ? ? Time Spent in minutes  30 ? ? ?Lala Lund M.D on 02/13/2022 at 9:54 AM ? ?To page go to [email or web address]  ? ?[facility] -  Office  [phone] ? ?See all Orders from today for further details ? ? ? Objective:  ? ?Vitals:  ? 02/13/22 0830 02/13/22 0844 02/13/22 0900 02/13/22 0930  ?BP: (!) 110/54  (!) 95/53 (!) 96/52  ?Pulse: (!) 59  (!) 58 61  ?Resp: (!) 21  (!) 23 16  ?Temp:  98.9 ?F (37.2 ?C)    ?TempSrc:  Axillary    ?SpO2: 98%  100% 100%  ?Weight:      ?Height:      ? ? ?Wt Readings from Last 3 Encounters:  ?02/12/22 90.7 kg  ?01/17/22 77.6 kg  ?12/29/21 78 kg  ? ? ? ?Intake/Output Summary (Last 24 hours) at 02/13/2022 0954 ?Last data filed at 02/13/2022 0845 ?Gross per 24 hour  ?Intake --  ?Output 200 ml  ?Net -200 ml  ? ? ? ?Physical Exam ? ?Awake Alert x 1, mildly confused, No new F.N deficits,   ?[place].AT,PERRAL ?Supple Neck, No JVD,   ?Symmetrical Chest wall movement, Good air movement bilaterally, CTAB ?RRR,No Gallops,Rubs or new Murmurs,  ?+ve B.Sounds, Abd Soft, No tenderness,   ?No Cyanosis, right hip internally rotated, ?  ? ?RN pressure injury documentation: ?Pressure Ulcer 03/29/16 Stage I -  Intact skin with non-blanchable redness of a localized area usually over a bony prominence. (Active)  ?03/29/16 1145  ?Location: Sacrum  ?Location Orientation: Medial  ?Staging: Stage I -  Intact skin with non-blanchable redness of a localized area usually over a bony prominence.  ?Wound Description (Comments):   ?Present on Admission: Yes  ?   ?Pressure Injury 09/30/17 Deep Tissue Injury - Purple or maroon localized area of discolored intact  skin or blood-filled blister due to damage of underlying soft tissue from pressure and/or shear. (Active)  ?09/30/17 1404  ?Location: Coccyx (RIGHT AND LEFT BUTTOCK CHEEKS)  ?Location Orientation: Left;Right  ?Staging: Karena Addison

## 2022-02-13 NOTE — ED Notes (Signed)
Attempted to give pt morning medication. Pt did not pass the swallow screen. MD made aware ?

## 2022-02-13 NOTE — Progress Notes (Signed)
[facility] night cross cover note: ? ?I was notified by RN that the patient had received a dose of IV Haldol for agitation, with ensuing EKG showing prolonged Qtc.  Patient with persistent agitation in spite of this dose of Haldol, with the patient pulling at his peripheral IV, pulling at his Foley catheter.  ? ?Per my chart review, including review of hospitalist H&P, will strictly avoid benzodiazepines.  Of note, there is an existing order for soft bilateral wrist restraints with siderails x4.  I conveyed to RN that these restraints should be initiated given the ongoing agitation posing interference with medical treatment representing potential harm to the patient, as follows redirection has been unsuccessful, and given limitations to pharmacologic intervention to further manage his agitation. ? ? ? ? ?Babs Bertin, DO ?Hospitalist ? ?

## 2022-02-13 NOTE — ED Notes (Signed)
Pt waking and becoming agitated. Stated he was in some pain. Began pulling at wires and gown as I was working to get morphine drawn up. Managed to keep him somewhat calm and still until administered.  PT is calmer for the moment.  ?

## 2022-02-13 NOTE — Transfer of Care (Signed)
Immediate Anesthesia Transfer of Care Note ? ?Patient: [name] ? ?Procedure(s) Performed: INTRAMEDULLARY (IM) NAIL INTERTROCHANTRIC (Right) ? ?Patient Location: PACU ? ?Anesthesia Type:General ? ?Level of Consciousness: drowsy and patient cooperative ? ?Airway & Oxygen Therapy: Patient Spontanous Breathing ? ?Post-op Assessment: Report given to RN and Post -op Vital signs reviewed and stable ? ?Post vital signs: Reviewed and stable ? ?Last Vitals:  ?Vitals Value Taken Time  ?BP 111/39 02/13/22 1611  ?Temp    ?Pulse 62 02/13/22 1612  ?Resp 22 02/13/22 1612  ?SpO2 97 % 02/13/22 1612  ?Vitals shown include unvalidated device data. ? ?Last Pain:  ?Vitals:  ? 02/13/22 1332  ?TempSrc:   ?PainSc: 0-No pain  ?   ? ?  ? ?Complications: No notable events documented. ?

## 2022-02-13 NOTE — Plan of Care (Signed)

## 2022-02-13 NOTE — ED Notes (Signed)
Manual Bladder Scan Preformed  ? ?Edges of the bladder easily visualized. ?About 10 mL measured volume.  ?

## 2022-02-13 NOTE — ED Notes (Signed)
At approx. 0145 this was discovered to be pulling at lines and removing gown again. Meanwhile another pt was trying to get out of bed.  This RN enlisted the help of his podmate and CN.  His podmate helped keep eye on this pt and began attempting to apply restraints per my request and the available order.  Myself and the CN slid the other pt up in bed and administered PRN meds for pain and agitation. Then myself and the CN helped to finish ensuring the safety and security of this patient.  ?

## 2022-02-13 NOTE — Anesthesia Procedure Notes (Signed)
Procedure Name: Intubation ?Date/Time: 02/13/2022 2:40 PM ?Performed by: Lance Coon, CRNA ?Pre-anesthesia Checklist: Patient identified, Emergency Drugs available, Suction available, Patient being monitored and Timeout performed ?Patient Re-evaluated:Patient Re-evaluated prior to induction ?Oxygen Delivery Method: Circle system utilized ?Preoxygenation: Pre-oxygenation with 100% oxygen ?Induction Type: IV induction ?Ventilation: Mask ventilation without difficulty ?Laryngoscope Size: Sabra Heck and 3 ?Grade View: Grade I ?Tube type: Oral ?Tube size: 7.5 mm ?Number of attempts: 1 ?Airway Equipment and Method: Stylet ?Placement Confirmation: ETT inserted through vocal cords under direct vision, positive ETCO2 and breath sounds checked- equal and bilateral ?Secured at: 23 cm ?Tube secured with: Tape ?Dental Injury: Teeth and Oropharynx as per pre-operative assessment  ? ? ? ? ?

## 2022-02-14 ENCOUNTER — Encounter (HOSPITAL_COMMUNITY): Payer: Self-pay | Admitting: Orthopedic Surgery

## 2022-02-14 DIAGNOSIS — E43 Unspecified severe protein-calorie malnutrition: Secondary | ICD-10-CM | POA: Insufficient documentation

## 2022-02-14 DIAGNOSIS — R4181 Age-related cognitive decline: Secondary | ICD-10-CM | POA: Diagnosis not present

## 2022-02-14 LAB — CBC WITH DIFFERENTIAL/PLATELET
Abs Immature Granulocytes: 0.03 10*3/uL (ref 0.00–0.07)
Basophils Absolute: 0 10*3/uL (ref 0.0–0.1)
Basophils Relative: 0 %
Eosinophils Absolute: 0 10*3/uL (ref 0.0–0.5)
Eosinophils Relative: 0 %
HCT: 26.7 % — ABNORMAL LOW (ref 39.0–52.0)
Hemoglobin: 9.3 g/dL — ABNORMAL LOW (ref 13.0–17.0)
Immature Granulocytes: 0 %
Lymphocytes Relative: 3 %
Lymphs Abs: 0.2 10*3/uL — ABNORMAL LOW (ref 0.7–4.0)
MCH: 34.8 pg — ABNORMAL HIGH (ref 26.0–34.0)
MCHC: 34.8 g/dL (ref 30.0–36.0)
MCV: 100 fL (ref 80.0–100.0)
Monocytes Absolute: 0.2 10*3/uL (ref 0.1–1.0)
Monocytes Relative: 2 %
Neutro Abs: 7.6 10*3/uL (ref 1.7–7.7)
Neutrophils Relative %: 95 %
Platelets: 85 10*3/uL — ABNORMAL LOW (ref 150–400)
RBC: 2.67 MIL/uL — ABNORMAL LOW (ref 4.22–5.81)
RDW: 13.2 % (ref 11.5–15.5)
WBC: 8 10*3/uL (ref 4.0–10.5)
nRBC: 0 % (ref 0.0–0.2)

## 2022-02-14 LAB — COMPREHENSIVE METABOLIC PANEL
ALT: 14 U/L (ref 0–44)
AST: 21 U/L (ref 15–41)
Albumin: 2.7 g/dL — ABNORMAL LOW (ref 3.5–5.0)
Alkaline Phosphatase: 52 U/L (ref 38–126)
Anion gap: 8 (ref 5–15)
BUN: 43 mg/dL — ABNORMAL HIGH (ref 8–23)
CO2: 23 mmol/L (ref 22–32)
Calcium: 8.3 mg/dL — ABNORMAL LOW (ref 8.9–10.3)
Chloride: 104 mmol/L (ref 98–111)
Creatinine, Ser: 1.38 mg/dL — ABNORMAL HIGH (ref 0.61–1.24)
GFR, Estimated: 49 mL/min — ABNORMAL LOW (ref >60)
Glucose, Bld: 183 mg/dL — ABNORMAL HIGH (ref 70–99)
Potassium: 3.7 mmol/L (ref 3.5–5.1)
Sodium: 135 mmol/L (ref 135–145)
Total Bilirubin: 0.7 mg/dL (ref 0.3–1.2)
Total Protein: 5.8 g/dL — ABNORMAL LOW (ref 6.5–8.1)

## 2022-02-14 LAB — UREA NITROGEN, URINE: Urea Nitrogen, Ur: 814 mg/dL

## 2022-02-14 LAB — MAGNESIUM: Magnesium: 2.2 mg/dL (ref 1.7–2.4)

## 2022-02-14 MED ORDER — HEPARIN SODIUM (PORCINE) 5000 UNIT/ML IJ SOLN
5000.0000 [IU] | Freq: Three times a day (TID) | INTRAMUSCULAR | Status: DC
  Administered 2022-02-14 – 2022-02-15 (×3): 5000 [IU] via SUBCUTANEOUS
  Filled 2022-02-14 (×3): qty 1

## 2022-02-14 MED ORDER — ADULT MULTIVITAMIN W/MINERALS CH
1.0000 | ORAL_TABLET | Freq: Every day | ORAL | Status: DC
  Administered 2022-02-14 – 2022-02-16 (×3): 1 via ORAL
  Filled 2022-02-14 (×3): qty 1

## 2022-02-14 MED ORDER — ENSURE ENLIVE PO LIQD
237.0000 mL | Freq: Three times a day (TID) | ORAL | Status: DC
  Administered 2022-02-14 – 2022-02-16 (×5): 237 mL via ORAL

## 2022-02-14 NOTE — Progress Notes (Addendum)
?                                  PROGRESS NOTE                                             ?                                                                                                                     ?                                         ? ? Patient Demographics:  ? ? [name], is a 88 y.o. male, DOB - 1931-02-16, GNO:[id number] ? ?Outpatient Primary MD for the patient is Caren Macadam, MD    LOS - 2  Admit date - 02/12/2022   ? ?Chief Complaint  ?Patient presents with  ? Fall  ?    ? ?Brief Narrative (HPI from H&P)  - 88 y.o. male, with H/O tobacco dependence - quit; AICD placement for NICM; PVD; HTN; HLD; COPD; chronic combined CHF; CAD s/p stenting; AAA s/p repair (2002); carotid disease, LGIB, age-related cognitive decline, recent urinary retention Foley catheter was placed yesterday in the ER, recent dental procedure 1 week ago after which he has been increasingly getting more confused at home for the last 1 week.  Mechanical fall at home fractured his right hip and was admitted to the hospital for right hip fracture. ? ? Subjective:  ? ?Patient in bed, appears comfortable, denies any headache, no fever, no chest pain or pressure, no shortness of breath , no abdominal pain. No focal weakness. ? ? Assessment  & Plan :  ? ?1.  Mechanical fall with CT evidence of Comminuted and displaced right intertrochanteric fracture with varus angulation. Additional, probable subcapital right femoral neck fracture which is nondisplaced.  He underwent surgical fixation of his fracture by Dr. Ginette Pitman on 02/13/2022, has tolerated the procedure reasonably well with minimal perioperative blood loss related anemia, no transfusion needed we will continue to monitor.  Weightbearing as tolerated, initiate PT OT will require SNF.  4 weeks of anticoagulation per Dr. Marcelino Scot.  Will give him heparin for 24 hours if no bleeding or hematoma will switch him to prophylactic Lovenox or Eliquis  depending on his placement.  Case discussed with Dr. Marcelino Scot on 02/14/2022. ? ? ? 2.  Chronic combined systolic and diastolic CHF last EF 16%.  He follows with Dr. Caryl Comes, currently compensated, blood pressure too low and Coreg has been discontinued in the outpatient setting, continue amiodarone, CKD and low blood pressure preclude addition of ACE/ARB, as needed IV hydralazine on board. ?  ? ?3.  CAD  with chronic ischemic cardiomyopathy EF 35% history of V. tach.  Continue aspirin and statin, no acute issues.  Chest pain-free.  EKG has chronic changes.  He has AICD, on amiodarone which will be continued, follows with Dr. Caryl Comes. ?  ?4. HX of chronic hypotension.  Continue midodrine. ?  ?5. Hypothyroidism.  On Synthroid continue. ?  ?6.  History of COPD.  Stable no acute issues as needed oxygen, keep pulse ox over 92% due to underlying history of CAD and CHF, good pulmonary toiletry, as needed nebulizer treatments. ?  ?7.  History of age-related cognitive decline.  Increasing delirium and metabolic encephalopathy at home lately.  Expect to get worse, as needed Haldol, minimize narcotics, strictly avoid benzodiazepines, safety restraints if needed.  High risk for fall and worsening encephalopathy/delirium.  Family member Scribner and bedside caregiver made aware.  Will involve PT, OT and speech postsurgery. ?  ?8.  Recent urinary retention and Foley catheter placement in the ER few days ago.  Continue Foley, flush every shift and monitor for signs of any obstruction, continue prostate medications, outpatient urology follow-up postdischarge ?  ?9.  Dyslipidemia.  Continue home dose statin. ?  ?10. 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 AKI improved. ? ?   ? ?Condition - Extremely Guarded ? ?Family Communication  :   Daughter [name] [phone] on 02/12/22, 02/13/22, 02/14/22 ? ?Code Status :  DNR ? ?Consults  :  Ortho ? ?PUD Prophylaxis : PPI ? ? Procedures  :    ? ?CT head and C-spine.  Nonacute. ? ?CT of the right hip - Comminuted and displaced right intertrochanteric fracture with varus angulation. Additional, probable subcapital right femoral neck fracture which is nondisplaced. ? ?   ? ?Disposition Plan  :   ? ?Status is: Inpatient ? ?DVT Prophylaxis  :   ? ?SCDs Start: 02/13/22 1709 ?SCDs Start: 02/12/22 1739 ?Place and maintain sequential compression device Start: 02/12/22 1707 ?Place TED hose Start: 02/12/22 1707 ? ? ?Lab Results  ?Component Value Date  ? PLT 85 (L) 02/14/2022  ? ? ?Diet :  ?Diet Order   ? ?       ?  DIET SOFT Room service appropriate? Yes; Fluid consistency: Thin  Diet effective now       ?  ? ?  ?  ? ?  ?  ? ?Inpatient Medications ? ?Scheduled Meds: ? acetaminophen  500 mg Oral Q8H  ? allopurinol  100 mg Oral Daily  ? amiodarone  200 mg Oral Daily  ? aspirin EC  81 mg Oral Daily  ? budesonide  0.5 mg Nebulization BID  ? Chlorhexidine Gluconate Cloth  6 each Topical Daily  ? cholecalciferol  2,000 Units Oral Daily  ? docusate sodium  100 mg Oral BID  ? levothyroxine  125 mcg Oral Daily  ? magnesium oxide  200 mg Oral Daily  ? midodrine  5 mg Oral TID WC  ? oxybutynin  5 mg Oral BID  ? pravastatin  40 mg Oral Daily  ? tamsulosin  0.4 mg Oral Daily  ? thiamine  100 mg Oral Daily  ? ?Continuous Infusions: ? dextrose 5 % and 0.45 % NaCl with KCl 10 mEq/L 50 mL/hr at 02/13/22 0736  ? lactated ringers 10 mL/hr at 02/13/22 1725  ? ?PRN Meds:.acetaminophen,  albuterol, haloperidol lactate, hydrALAZINE, HYDROcodone-acetaminophen, menthol-cetylpyridinium **OR** phenol, metoCLOPramide **OR** metoCLOPramide (REGLAN) injection, metoprolol tartrate, morphine injection, ondansetron **OR** ondansetron (ZOFRAN) IV, senna-docusate ? ?Antibiotics  :   ? ?Anti-infectives (From admission, onward)  ? ? Start     Dose/Rate  Route Frequency Ordered Stop  ? 02/14/22 0600  ceFAZolin (ANCEF) IVPB 2g/100 mL premix       ? 2 g ?200 mL/hr over 30 Minutes Intravenous On call to O.R. 02/13/22 1241 02/13/22 1452  ? 02/13/22 1800  ceFAZolin (ANCEF) IVPB 2g/100 mL premix       ? 2 g ?200 mL/hr over 30 Minutes Intravenous Every 6 hours 02/13/22 1709 02/14/22 0106  ? ?  ? ? ? Time Spent in minutes  30 ? ? ?Lala Lund M.D on 02/14/2022 at 9:55 AM ? ?To page go to [email or web address]  ? ?[facility] -  Office  [phone] ? ?See all Orders from today for further details ? ? ? Objective:  ? ?Vitals:  ? 02/13/22 2358 02/14/22 8242 02/14/22 3536 02/14/22 1443  ?BP: (!) 106/58 (!) 108/57 (!) 110/55   ?Pulse: (!) 58 60 (!) 59 60  ?Resp: '18 18 17 16  '$ ?Temp:   (!) 97.5 ?F (36.4 ?C)   ?TempSrc:   Oral   ?SpO2: 95% 100% 99%   ?Weight:      ?Height:      ? ? ?Wt Readings from Last 3 Encounters:  ?02/12/22 90.7 kg  ?01/17/22 77.6 kg  ?12/29/21 78 kg  ? ? ? ?Intake/Output Summary (Last 24 hours) at 02/14/2022 0955 ?Last data filed at 02/13/2022 1640 ?Gross per 24 hour  ?Intake 1050 ml  ?Output 175 ml  ?Net 875 ml  ? ? ? ?Physical Exam ? ?Awake Alert x1, No new F.N deficits, Normal affect ?Coleman.AT,PERRAL ?Supple Neck, No JVD,   ?Symmetrical Chest wall movement, Good air movement bilaterally, CTAB ?RRR,No Gallops, Rubs or new Murmurs,  ?+ve B.Sounds, Abd Soft, No tenderness,   ?No Cyanosis, right hip postop site area stable with bandage over the incision ? ?  ? ?RN pressure injury documentation: ?Pressure Ulcer 03/29/16 Stage I -  Intact skin with non-blanchable redness of a localized area usually over a bony prominence. (Active)  ?03/29/16 1145  ?Location: Sacrum  ?Location Orientation: Medial  ?Staging: Stage I -  Intact skin with non-blanchable redness of a localized area usually over a bony prominence.  ?Wound Description (Comments):   ?Present on Admission: Yes  ?   ?Pressure Injury 09/30/17 Deep Tissue Injury - Purple or maroon localized area of discolored  intact skin or blood-filled blister due to damage of underlying soft tissue from pressure and/or shear. (Active)  ?09/30/17 1404  ?Location: Coccyx (RIGHT AND LEFT BUTTOCK CHEEKS)  ?Location Orientation: Lef

## 2022-02-14 NOTE — Progress Notes (Addendum)
Initial Nutrition Assessment ? ?DOCUMENTATION CODES:  ? ?Severe malnutrition in context of chronic illness ? ?INTERVENTION:  ? ?Ensure Enlive po TID, each supplement provides 350 kcal and 20 grams of protein. ? ?MVI with minerals daily. ? ?NUTRITION DIAGNOSIS:  ? ?Severe Malnutrition related to chronic illness (COPD, CHF) as evidenced by severe muscle depletion, severe fat depletion. ? ?GOAL:  ? ?Patient will meet greater than or equal to 90% of their needs ? ?MONITOR:  ? ?PO intake, Supplement acceptance ? ?REASON FOR ASSESSMENT:  ? ?Consult ?Hip fracture protocol ? ?ASSESSMENT:  ? ?88 yo male admitted with right hip fracture S/P fall at home. PMH includes AICD, PVD, HTN, HLD, COPD, CHF, CAD, AAA repair, age-related cognitive decline, dental procedure 1 week PTA. ? ?4/25 - S/P IM nail repair of right hip fracture. ? ?Spoke with patient and his daughter at bedside. He has been eating okay, but does not eat a lot at baseline. He has a long history of dysphagia. He has had thickened liquids in the past and does not want to have thickened liquids again. He likes chocolate Ensure supplements. Will add TID between meals.   ? ?S/P bedside swallow evaluation with SLP this morning and found to have severe aspiration risk. Diet down graded to dysphagia 1 with thin liquids with known risk of aspiration of thin liquids.  ? ?Labs reviewed.  ?Medications reviewed and include cholecalciferol, Colace, Mag-Ox, Flomax, thiamine. ?IVF: D5 1/2 NS with KC at 50 ml/h (being discontinued today). ? ?Recent weights reviewed. No significant weight loss noted. Current weight is 90.7 kg (200 lbs); above usual weight of 77-80 kg; likely a stated/estimated weight. ? ?Patient meets criteria for severe malnutrition, given severe depletion of muscle and subcutaneous fat mass. ? ?NUTRITION - FOCUSED PHYSICAL EXAM: ? ?Flowsheet Row Most Recent Value  ?Orbital Region Severe depletion  ?Upper Arm Region Severe depletion  ?Thoracic and Lumbar Region  Severe depletion  ?Buccal Region Severe depletion  ?Temple Region Severe depletion  ?Clavicle Bone Region Severe depletion  ?Clavicle and Acromion Bone Region Severe depletion  ?Scapular Bone Region Severe depletion  ?Dorsal Hand Moderate depletion  ?Patellar Region Moderate depletion  ?Anterior Thigh Region Moderate depletion  ?Posterior Calf Region Moderate depletion  ?Edema (RD Assessment) None  ?Hair Reviewed  ?Eyes Reviewed  ?Mouth Reviewed  ?Skin Reviewed  [flaky]  ?Nails Reviewed  ? ?  ? ? ?Diet Order:   ?Diet Order   ? ?       ?  DIET - DYS 1 Room service appropriate? Yes; Fluid consistency: Thin  Diet effective now       ?  ? ?  ?  ? ?  ? ? ?EDUCATION NEEDS:  ? ?No education needs have been identified at this time ? ?Skin:  Skin Assessment: Reviewed RN Assessment ? ?Last BM:  no BM documented ? ?Height:  ? ?Ht Readings from Last 1 Encounters:  ?02/12/22 '5\' 11"'$  (1.803 m)  ? ? ?Weight:  ? ?Wt Readings from Last 1 Encounters:  ?02/12/22 90.7 kg  ? ? ?BMI:  Body mass index is 27.89 kg/m?. ? ?Estimated Nutritional Needs:  ? ?Kcal:  2000-2300 ? ?Protein:  110-130 gm ? ?Fluid:  >/= 2 L ? ? ? ?Lucas Mallow RD, LDN, CNSC ?Please refer to Amion for contact information.                                                       ? ?

## 2022-02-14 NOTE — Op Note (Signed)
02/13/2022 ? ?5:39 PM ? ?PATIENT:  [name]  1931-05-15 male  ? ?MEDICAL RECORD NUMBER: [id number] ? ?PRE-OPERATIVE DIAGNOSIS:  RIGHT FOUR PART INTERTROCHANTERIC HIP FRACTURE ? ?POST-OPERATIVE DIAGNOSIS:  RIGHT FOUR PART INTERTROCHANTERIC HIP FRACTURE ? ?PROCEDURE:  INTRAMEDULLARY NAILING OF THE RIGHT HIP using a short 11 mm Biomet Affixus nail. ? ?SURGEON:  Astrid Divine. Marcelino Scot, M.D. ? ?ASSISTANT:  Ainsley Spinner, PA-C. ? ?ANESTHESIA:  General. ? ?COMPLICATIONS:  None. ? ?ESTIMATED BLOOD LOSS:  Less than 150 mL. ? ?DISPOSITION:  To PACU. ? ?CONDITION:  Stable. ? ?DELAY START OF DVT PROPHYLAXIS BECAUSE OF BLEEDING RISK: NO ? ?BRIEF SUMMARY AND INDICATION OF PROCEDURE:  [name] is a 88 y.o. year- ?old with multiple medical problems.  I discussed with the patient's family risks and benefits of surgical treatment including the potential for malunion, nonunion, symptomatic hardware, heart attack, ?stroke, neurovascular injury, bleeding, and others.  After full ?discussion, the family wished to proceed. ? ?BRIEF SUMMARY OF PROCEDURE:  The patient was taken to the operating room ?where general anesthesia was induced.  He was positioned supine on the ?Hana fracture table.  A closed reduction maneuver was performed of the fractured proximal femur and this was confirmed on both AP and lateral xray views. A thorough scrub and wash with chlorhexidine and then Betadine scrub and paint was performed.  After sterile drapes and time-out, a long instrument was used to identify the appropriate starting position under C-arm on both AP and lateral images.  A 3 cm incision was made proximal to the greater trochanter.  The curved cannulated awl was inserted just medial to the tip of the lateral trochanter and then the starting guidewire ?advanced into the proximal femur.  This was checked on AP and lateral ?views.  The starting reamer was engaged with the soft tissue protected ?by a sleeve.  The curved ball-tipped guidewire  was then inserted, making ?sure it was just posterior as possible in the distal femur and across ?the fracture site, which stayed in a reduced position.  It was reamed with the starting reamer and the 11 mm short nail inserted to the appropriate depth.  The guidewire for the lag screw was then inserted ?with the appropriate anteversion to make sure it was in a center-center ?position.  This was measured and the lag screw placed with excellent ?purchase and position checked on both views.  The set screw was then engaged within the groove of the lag screw, which was allowed to ?telescope.  Traction was released and compression achieved with the ?screw.  This was followed by placement of one distal locking screw using the jig.  This was confirmed on AP and lateral images. ?Wounds were irrigated thoroughly, closed in a standard layered fashion. ?Sterile gently compressive dressings were applied.  Ainsley Spinner, PA-C, ?assisted throughout.  The patient was awakened from anesthesia and ?transported to the PACU in stable condition. ? ?PROGNOSIS:  The patient will be weightbearing as tolerated with physical therapy.  He has no range of motion precautions.  We will ?continue to follow through at the hospital.  Anticipate follow up in the ?office in 2 weeks for removal of sutures and further evaluation. ? ? ? ? ?Astrid Divine. Marcelino Scot, M.D. ?  ?

## 2022-02-14 NOTE — [place] FL2 (Signed)
?[place] MEDICAID FL2 LEVEL OF CARE SCREENING TOOL  ?  ? ?IDENTIFICATION  ?Patient Name: ?[name] Birthdate: [birth year] Sex: male Admission Date (Current Location): ?02/12/2022  ?[place] and [place] Number: ? [place] ?  Facility and Address:  ?[facility] [place], [place] [address], [place], [place] [address] ?     Provider Number: ?[id number]  ?Attending Physician Name and Address:  ?Thurnell Lose, MD ? Relative Name and Phone Number:  ?Marshell Levan Daughter   [phone] ?   ?Current Level of Care: ?Hospital Recommended Level of Care: ?[place] Prior Approval Number: ?  ? ?Date Approved/Denied: ?  PASRR Number: ?[id number] A ? ?Discharge Plan: ?SNF ?  ? ?Current Diagnoses: ?Patient Active Problem List  ? Diagnosis Date Noted  ? Closed right hip fracture ([place]) 02/12/2022  ? Age-related cognitive decline 02/12/2022  ? Coronary artery disease involving native coronary artery of native heart without angina pectoris 09/30/2019  ? Dyslipidemia 09/30/2019  ? Educated about COVID-19 virus infection 03/20/2019  ? VT (ventricular tachycardia) ([place]) 03/20/2019  ? Chronic hypoxemic respiratory failure ([place]) 08/26/2018  ? Gait abnormality 08/26/2018  ? Lower GI bleeding 07/21/2018  ? Forehead laceration, initial encounter 07/21/2018  ? CKD (chronic kidney disease) stage 3, GFR 30-59 ml/min (HCC) 07/21/2018  ? History of pneumonia 07/15/2018  ? Physical deconditioning 11/13/2017  ? Weakness 11/12/2017  ? Falls 11/12/2017  ? Pleural effusion   ? CHF exacerbation ([place]) 09/30/2017  ? Shingles outbreak 05/27/2017  ? Seborrheic dermatitis 05/27/2017  ? Acute on chronic respiratory failure with hypoxia ([place]) 01/13/2017  ? COPD with acute bronchitis ([place]) 01/13/2017  ? CAD S/P percutaneous coronary angioplasty   ? Chronic combined systolic and diastolic CHF (congestive heart failure) ([place])   ? Blunt chest trauma 05/04/2016  ? 4.1 cm Aneurysm of thoracic aorta ([place]) 04/27/2016  ? SIADH  (syndrome of inappropriate ADH production) ([place])   ? Orthostatic hypotension 04/12/2016  ? TBI (traumatic brain injury) ([place])   ? Acute on chronic combined systolic and diastolic congestive heart failure ([place])   ? S/P ORIF (open reduction internal fixation) fracture   ? Thrombocytopenia ([place])   ? Urinary retention   ? Slow transit constipation   ? Thyroid activity decreased   ? Traumatic subdural hematoma with loss of consciousness ([place])   ? Pulmonary hypertension ([place])   ? Cardiomyopathy, ischemic   ? Subdural hematoma (HCC)   ? Cardiac arrest ([place]) 03/29/2016  ? 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 T wave over sensing resulting in inappropriate shocks 08/14/2013  ? Vitamin D deficiency disease 04/09/2012  ? Actinic skin damage 05/22/2011  ? Alcohol use 03/22/2011  ? Other dysphagia 02/16/2011  ? Carotid arterial disease ([place]) 11/17/2009  ? GOUT 09/23/2009  ? Age-related osteoporosis with current pathological fracture 09/23/2009  ? SYNCOPE 04/19/2009  ? Automatic implantable cardioverter-defibrillator in situ 04/13/2008  ? COLONIC POLYPS 12/09/2007  ? Diverticulosis of colon 12/09/2007  ? Essential hypertension 12/08/2007  ? Peripheral vascular disease ([place]) 12/08/2007  ? COPD GOLD III 12/08/2007  ? Osteoarthritis 12/08/2007  ? ? ?Orientation RESPIRATION BLADDER Height & Weight   ?  ?Self ? Normal Incontinent, Indwelling catheter Weight: 200 lb (90.7 kg) ?Height:  '5\' 11"'$  (180.3 cm)  ?BEHAVIORAL SYMPTOMS/MOOD NEUROLOGICAL BOWEL NUTRITION STATUS  ?    Incontinent Diet (see discharge summary)  ?AMBULATORY STATUS COMMUNICATION OF NEEDS Skin   ?Total Care Verbally Surgical wounds ?  ?  ?  ?    ?     ?     ? ? ?  Personal Care Assistance Level of Assistance  ?Bathing, Feeding, Dressing Bathing Assistance: Maximum assistance ?Feeding assistance: Limited assistance ?Dressing Assistance: Maximum assistance ?   ? ?Functional Limitations Info  ?Sight, Hearing, Speech Sight Info: Adequate ?Hearing Info:  Adequate ?Speech Info: Adequate  ? ? ?SPECIAL CARE FACTORS FREQUENCY  ?PT (By licensed PT), OT (By licensed OT)   ?  ?PT Frequency: 5x week ?OT Frequency: 5x week ?  ?  ?  ?   ? ? ?Contractures Contractures Info: Not present  ? ? ?Additional Factors Info  ?Code Status, Allergies Code Status Info: full ?Allergies Info: lisinopril ?  ?  ?  ?   ? ?Current Medications (02/14/2022):  This is the current hospital active medication list ?Current Facility-Administered Medications  ?Medication Dose Route Frequency Provider Last Rate Last Admin  ? acetaminophen (TYLENOL) tablet 325-650 mg  325-650 mg Oral Q6H PRN Ainsley Spinner, PA-C      ? acetaminophen (TYLENOL) tablet 500 mg  500 mg Oral Q8H Ainsley Spinner, PA-C   500 mg at 02/14/22 1001  ? albuterol (PROVENTIL) (2.5 MG/3ML) 0.083% nebulizer solution 2.5 mg  2.5 mg Nebulization Q4H PRN Ainsley Spinner, PA-C      ? allopurinol (ZYLOPRIM) tablet 100 mg  100 mg Oral Daily Ainsley Spinner, PA-C   100 mg at 02/14/22 1002  ? amiodarone (PACERONE) tablet 200 mg  200 mg Oral Daily Ainsley Spinner, PA-C   200 mg at 02/14/22 1315  ? aspirin EC tablet 81 mg  81 mg Oral Daily Ainsley Spinner, PA-C   81 mg at 02/14/22 1001  ? budesonide (PULMICORT) nebulizer solution 0.5 mg  0.5 mg Nebulization BID Ainsley Spinner, PA-C   0.5 mg at 02/14/22 1324  ? Chlorhexidine Gluconate Cloth 2 % PADS 6 each  6 each Topical Daily Altamese [place], MD   6 each at 02/14/22 1315  ? cholecalciferol (VITAMIN D3) tablet 2,000 Units  2,000 Units Oral Daily Ainsley Spinner, PA-C   2,000 Units at 02/14/22 1001  ? docusate sodium (COLACE) capsule 100 mg  100 mg Oral BID Ainsley Spinner, PA-C   100 mg at 02/14/22 1002  ? feeding supplement (ENSURE ENLIVE / ENSURE PLUS) liquid 237 mL  237 mL Oral TID BM Thurnell Lose, MD   237 mL at 02/14/22 1315  ? haloperidol lactate (HALDOL) injection 1 mg  1 mg Intravenous Q6H PRN Ainsley Spinner, PA-C   1 mg at 02/12/22 1855  ? heparin injection 5,000 Units  5,000 Units Subcutaneous Q8H Thurnell Lose, MD    5,000 Units at 02/14/22 1315  ? hydrALAZINE (APRESOLINE) injection 10 mg  10 mg Intravenous Q6H PRN Ainsley Spinner, PA-C      ? HYDROcodone-acetaminophen (NORCO/VICODIN) 5-325 MG per tablet 1 tablet  1 tablet Oral Q4H PRN Ainsley Spinner, PA-C   1 tablet at 02/14/22 0035  ? lactated ringers infusion   Intravenous Continuous Freddrick March, MD 10 mL/hr at 02/13/22 1725 New Bag at 02/13/22 1725  ? levothyroxine (SYNTHROID) tablet 125 mcg  125 mcg Oral Daily Ainsley Spinner, PA-C   125 mcg at 02/14/22 4010  ? magnesium oxide (MAG-OX) tablet 200 mg  200 mg Oral Daily Ainsley Spinner, PA-C   200 mg at 02/14/22 1001  ? metoprolol tartrate (LOPRESSOR) injection 5 mg  5 mg Intravenous Q4H PRN Ainsley Spinner, PA-C      ? midodrine (PROAMATINE) tablet 5 mg  5 mg Oral TID WC Ainsley Spinner, PA-C   5 mg at 02/14/22 1315  ?  morphine (PF) 2 MG/ML injection 0.5-1 mg  0.5-1 mg Intravenous Q2H PRN Ainsley Spinner, PA-C      ? multivitamin with minerals tablet 1 tablet  1 tablet Oral Daily Thurnell Lose, MD   1 tablet at 02/14/22 1314  ? ondansetron (ZOFRAN) injection 4 mg  4 mg Intravenous Q6H PRN Ainsley Spinner, PA-C      ? oxybutynin (DITROPAN) tablet 5 mg  5 mg Oral BID Ainsley Spinner, PA-C   5 mg at 02/14/22 1003  ? pravastatin (PRAVACHOL) tablet 40 mg  40 mg Oral Daily Ainsley Spinner, PA-C   40 mg at 02/14/22 1002  ? senna-docusate (Senokot-S) tablet 1 tablet  1 tablet Oral QHS PRN Ainsley Spinner, PA-C   1 tablet at 02/13/22 2111  ? tamsulosin (FLOMAX) capsule 0.4 mg  0.4 mg Oral Daily Ainsley Spinner, PA-C   0.4 mg at 02/14/22 1001  ? thiamine tablet 100 mg  100 mg Oral Daily Ainsley Spinner, PA-C   100 mg at 02/14/22 1002  ? ? ? ?Discharge Medications: ?Please see discharge summary for a list of discharge medications. ? ?Relevant Imaging Results: ? ?Relevant Lab Results: ? ? ?Additional Information ?SS#[id number]. Pt is vaccinated for covid with one booster. ? ?Joanne Chars, LCSW ? ? ? ? ?

## 2022-02-14 NOTE — TOC Initial Note (Signed)
Transition of Care (TOC) - Initial/Assessment Note  ? ? ?Patient Details  ?Name: [name] ?MRN: [id number] ?Date of Birth: 1931-05-06 ? ?Transition of Care [facility]) CM/SW Contact:    ?Joanne Chars, LCSW ?Phone Number: ?02/14/2022, 3:19 PM ? ?Clinical Narrative:    Pt oriented x1, pt caregiver Katharine Look in room with pt.  CSW spoke with daughter [name] by phone, she is in agreement with plan for SNF.  Choice document left in room with Katharine Look.  Permission given to send out referral in hub.  Pt lives at home alone but has [facility] aide during the day 6 days per week and daughter stays with pt on Sundays.  Pt is alone at night.   ? ?Referral sent out in hub for SNF.            ? ? ?Expected Discharge Plan: [place] ?Barriers to Discharge: Continued Medical Work up, SNF Pending bed offer ? ? ?Patient Goals and CMS Choice ?  ?CMS Medicare.gov Compare Post Acute Care list provided to:: Patient Represenative (must comment) ?Choice offered to / list presented to : Adult Children ? ?Expected Discharge Plan and Services ?Expected Discharge Plan: [place] ?In-house Referral: Clinical Social Work ?  ?Post Acute Care Choice: [place] ?Living arrangements for the past 2 months: [place] ?                ?  ?  ?  ?  ?  ?  ?  ?  ?  ?  ? ?Prior Living Arrangements/Services ?Living arrangements for the past 2 months: [place] ?Lives with:: Self ?Patient language and need for interpreter reviewed:: Yes ?       ?Need for Family Participation in Patient Care: Yes (Comment) ?Care giver support system in place?: Yes (comment) ?Current home services: Homehealth aide (6 days per week) ?Criminal Activity/Legal Involvement Pertinent to Current Situation/Hospitalization: No - Comment as needed ? ?Activities of Daily Living ?Home Assistive Devices/Equipment: [place] Rile (specify type) ?ADL Screening (condition at time of admission) ?Patient's cognitive ability adequate to safely  complete daily activities?: Yes ?Is the patient deaf or have difficulty hearing?: Yes ?Does the patient have difficulty seeing, even when wearing glasses/contacts?: No ?Does the patient have difficulty concentrating, remembering, or making decisions?: Yes ?Patient able to express need for assistance with ADLs?: Yes ?Does the patient have difficulty dressing or bathing?: No ?Independently performs ADLs?: Yes (appropriate for developmental age) ?Does the patient have difficulty walking or climbing stairs?: No ?Weakness of Legs: None ?Weakness of Arms/Hands: None ? ?Permission Sought/Granted ?  ?  ?   ?   ?   ?   ? ?Emotional Assessment ?Appearance:: Appears stated age ?Attitude/Demeanor/Rapport: Unable to Assess ?Affect (typically observed): Pleasant ?Orientation: : Oriented to Self ?Alcohol / Substance Use: Not Applicable ?Psych Involvement: No (comment) ? ?Admission diagnosis:  Closed right hip fracture ([place]) [S72.001A] ?Fall, initial encounter [W19.XXXA] ?Closed fracture of right hip, initial encounter ([place]) [S72.001A] ?Patient Active Problem List  ? Diagnosis Date Noted  ? Closed right hip fracture ([place]) 02/12/2022  ? Age-related cognitive decline 02/12/2022  ? Coronary artery disease involving native coronary artery of native heart without angina pectoris 09/30/2019  ? Dyslipidemia 09/30/2019  ? Educated about COVID-19 virus infection 03/20/2019  ? VT (ventricular tachycardia) ([place]) 03/20/2019  ? Chronic hypoxemic respiratory failure ([place]) 08/26/2018  ? Gait abnormality 08/26/2018  ? Lower GI bleeding 07/21/2018  ? Forehead laceration, initial encounter 07/21/2018  ? CKD (chronic  kidney disease) stage 3, GFR 30-59 ml/min (HCC) 07/21/2018  ? History of pneumonia 07/15/2018  ? Physical deconditioning 11/13/2017  ? Weakness 11/12/2017  ? Falls 11/12/2017  ? Pleural effusion   ? CHF exacerbation ([place]) 09/30/2017  ? Shingles outbreak 05/27/2017  ? Seborrheic dermatitis 05/27/2017  ? Acute on chronic respiratory  failure with hypoxia ([place]) 01/13/2017  ? COPD with acute bronchitis ([place]) 01/13/2017  ? CAD S/P percutaneous coronary angioplasty   ? Chronic combined systolic and diastolic CHF (congestive heart failure) ([place])   ? Blunt chest trauma 05/04/2016  ? 4.1 cm Aneurysm of thoracic aorta ([place]) 04/27/2016  ? SIADH (syndrome of inappropriate ADH production) ([place])   ? Orthostatic hypotension 04/12/2016  ? TBI (traumatic brain injury) ([place])   ? Acute on chronic combined systolic and diastolic congestive heart failure ([place])   ? S/P ORIF (open reduction internal fixation) fracture   ? Thrombocytopenia ([place])   ? Urinary retention   ? Slow transit constipation   ? Thyroid activity decreased   ? Traumatic subdural hematoma with loss of consciousness ([place])   ? Pulmonary hypertension ([place])   ? Cardiomyopathy, ischemic   ? Subdural hematoma (HCC)   ? Cardiac arrest ([place]) 03/29/2016  ? 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 T wave over sensing resulting in inappropriate shocks 08/14/2013  ? Vitamin D deficiency disease 04/09/2012  ? Actinic skin damage 05/22/2011  ? Alcohol use 03/22/2011  ? Other dysphagia 02/16/2011  ? Carotid arterial disease ([place]) 11/17/2009  ? GOUT 09/23/2009  ? Age-related osteoporosis with current pathological fracture 09/23/2009  ? SYNCOPE 04/19/2009  ? Automatic implantable cardioverter-defibrillator in situ 04/13/2008  ? COLONIC POLYPS 12/09/2007  ? Diverticulosis of colon 12/09/2007  ? Essential hypertension 12/08/2007  ? Peripheral vascular disease ([place]) 12/08/2007  ? COPD GOLD III 12/08/2007  ? Osteoarthritis 12/08/2007  ? ?PCP:  Caren Macadam, MD ?Pharmacy:   ?[facility], [address] ?[address] ?[place] [address] ?Phone: [phone] Fax: [phone] ? ?[place], [place] [address] ?Phone: [phone] Fax: [phone] ? ? ? ? ?Social Determinants of Health  (SDOH) Interventions ?  ? ?Readmission Risk Interventions ?   ? View : No data to display.  ?  ?  ?  ? ? ? ?

## 2022-02-14 NOTE — Evaluation (Signed)
Physical Therapy Evaluation ? ?Patient Details ?Name: [name] ?MRN: [id number] ?DOB: [birth year] ?Today's Date: 02/14/2022 ? ?History of Present Illness ? Pt is an 88 y/o male who presents s/p fall out of bed, sustaining a R hip fracture. He is now s/p R IM nail on 02/13/2022. PMH significant for AAA s/p repair 2002, AICD, CAD, CHF, COPD, DJD, gout, HTN, osteoporosis, PVD. ?  ?Clinical Impression ? Pt admitted with above diagnosis. Pt currently with functional limitations due to the deficits listed below (see PT Problem List). At the time of PT eval pt was able to perform transfers with up to max assist and the [facility] for support. The [place] was helpful for pt to maintain standing during peri-care activity and linen change in the chair after bowel incontinence. Feel this patient would benefit from post-acute rehab at the SNF level to maximize functional independence and safety prior to return home with family support. Acutely, pt will benefit from skilled PT to increase their independence and safety with mobility to allow discharge to the venue listed below.      ?   ? ?Recommendations for follow up therapy are one component of a multi-disciplinary discharge planning process, led by the attending physician.  Recommendations may be updated based on patient status, additional functional criteria and insurance authorization. ? ?Follow Up Recommendations Skilled nursing-short term rehab (<3 hours/day) ? ?  ?Assistance Recommended at Discharge Frequent or constant Supervision/Assistance  ?Patient can return home with the following ? Two people to help with walking and/or transfers;Two people to help with bathing/dressing/bathroom;Assistance with cooking/housework;Assist for transportation;Help with stairs or ramp for entrance ? ?  ?Equipment Recommendations None recommended by PT (TBD by next venue of care)  ?Recommendations for Other Services ?    ?  ?Functional Status Assessment Patient has had a recent decline in  their functional status and demonstrates the ability to make significant improvements in function in a reasonable and predictable amount of time.  ? ?  ?Precautions / Restrictions Precautions ?Precautions: Fall ?Restrictions ?Weight Bearing Restrictions: Yes ?RLE Weight Bearing: Weight bearing as tolerated  ? ?  ? ?Mobility ? Bed Mobility ?  ?  ?  ?  ?  ?  ?  ?General bed mobility comments: Pt was received sitting up in recliner. ?  ? ?Transfers ?Overall transfer level: Needs assistance ?Equipment used: Rolling walker (2 wheels) ?Transfers: Sit to/from Stand, Bed to chair/wheelchair/BSC ?Sit to Stand: Max assist ?  ?  ?  ?  ?  ?General transfer comment: Pt initially stood to RW with up to max assist for power up to full stand, and stabilization while pt transferred hands from chair to walker. VC's throughout for hand placement on seated surface for safety, and sequencing of the transfer. Once standing, noted pt with heavy smear of stool. The Charlaine Dalton was utilized for second stand for pericare and to change chair linens before pt sat back down. Again, max assist provided for power up within [place]. ?Transfer via Lift Equipment: Stedy ? ?Ambulation/Gait ?  ?  ?  ?  ?  ?  ?  ?General Gait Details: Unable to progress to gait training at this time. ? ?Stairs ?  ?  ?  ?  ?  ? ?Wheelchair Mobility ?  ? ?Modified Rankin (Stroke Patients Only) ?  ? ?  ? ?Balance Overall balance assessment: Needs assistance ?Sitting-balance support: Feet supported, No upper extremity supported ?Sitting balance-Leahy Scale: Poor ?  ?  ?Standing balance support: Bilateral upper extremity  supported, During functional activity, Reliant on assistive device for balance ?Standing balance-Leahy Scale: Zero ?Standing balance comment: max assist required throughout standing activity. ?  ?  ?  ?  ?  ?  ?  ?  ?  ?  ?  ?   ? ? ? ?Pertinent Vitals/Pain Pain Assessment ?Pain Assessment: Faces ?Faces Pain Scale: Hurts even more ?Pain Location: R hip ?Pain  Descriptors / Indicators: Operative site guarding, Grimacing, Sore ?Pain Intervention(s): Limited activity within patient's tolerance, Monitored during session, Repositioned  ? ? ?Home Living Family/patient expects to be discharged to:: Skilled nursing facility ?Living Arrangements: Alone ?  ?  ?  ?  ?  ?  ?  ?  ?   ?  ?Prior Function Prior Level of Function : Independent/Modified Independent ?  ?  ?  ?  ?  ?  ?Mobility Comments: Daughter reports pt was ambulating with a cane in public and occasionally with the RW in the house but oftentimes would forget about using the walker when up and around his home. ?ADLs Comments: Daughter reports pt was taking care of meals, dressing, and washing up on his own but she was unsure the exact level of independence he was at PTA. ?  ? ? ?Hand Dominance  ? Dominant Hand: Right ? ?  ?Extremity/Trunk Assessment  ? Upper Extremity Assessment ?Upper Extremity Assessment: Defer to OT evaluation ?  ? ?Lower Extremity Assessment ?Lower Extremity Assessment: RLE deficits/detail ?RLE Deficits / Details: Acute pain, decreased strength, and AROM consistent with above mentioned surgery. ?  ? ?Cervical / Trunk Assessment ?Cervical / Trunk Assessment: Kyphotic;Other exceptions ?Cervical / Trunk Exceptions: Forward head posture with rounded shoulders.  ?Communication  ? Communication: HOH  ?Cognition Arousal/Alertness: Awake/alert ?Behavior During Therapy: [facility] for tasks assessed/performed ?Overall Cognitive Status: Impaired/Different from baseline ?Area of Impairment: Orientation, Following commands, Safety/judgement, Awareness, Problem solving ?  ?  ?  ?  ?  ?  ?  ?  ?Orientation Level: Disoriented to, Time, Situation ?  ?  ?Following Commands: Follows one step commands consistently, Follows one step commands with increased time, Follows multi-step commands inconsistently ?Safety/Judgement: Decreased awareness of safety ?Awareness: Intellectual ?Problem Solving: Slow processing, Decreased  initiation, Difficulty sequencing, Requires verbal cues, Requires tactile cues ?  ?  ?  ? ?  ?General Comments   ? ?  ?Exercises General Exercises - Lower Extremity ?Quad Sets: 15 reps ?Long Arc Quad: 5 reps  ? ?Assessment/Plan  ?  ?PT Assessment Patient needs continued PT services  ?PT Problem List Decreased strength;Decreased range of motion;Decreased activity tolerance;Decreased balance;Decreased mobility;Decreased cognition;Decreased knowledge of use of DME;Decreased safety awareness;Decreased knowledge of precautions;Pain ? ?   ?  ?PT Treatment Interventions DME instruction;Gait training;Functional mobility training;Therapeutic activities;Therapeutic exercise;Balance training;Cognitive remediation;Patient/family education   ? ?PT Goals (Current goals can be found in the Care Plan section)  ?Acute Rehab PT Goals ?Patient Stated Goal: Feel better ?PT Goal Formulation: With patient/family ?Time For Goal Achievement: 02/28/22 ?Potential to Achieve Goals: Good ? ?  ?Frequency Min 3X/week ?  ? ? ?Co-evaluation   ?  ?  ?  ?  ? ? ?  ?AM-PAC PT "6 Clicks" Mobility  ?Outcome Measure Help needed turning from your back to your side while in a flat bed without using bedrails?: A Lot ?Help needed moving from lying on your back to sitting on the side of a flat bed without using bedrails?: A Lot ?Help needed moving to and from a bed to a chair (  including a wheelchair)?: A Lot ?Help needed standing up from a chair using your arms (e.g., wheelchair or bedside chair)?: A Lot ?Help needed to walk in hospital room?: Total ?Help needed climbing 3-5 steps with a railing? : Total ?6 Click Score: 10 ? ?  ?End of Session Equipment Utilized During Treatment: Gait belt ?Activity Tolerance: Patient tolerated treatment well ?Patient left: in chair;with call bell/phone within reach;with chair alarm set;with family/visitor present ?Nurse Communication: Mobility status;Need for lift equipment (NT - Jun) ?PT Visit Diagnosis: Unsteadiness on  feet (R26.81);Pain;History of falling (Z91.81) ?Pain - Right/Left: Right ?Pain - part of body: Hip ?  ? ?Time: 8288-3374 ?PT Time Calculation (min) (ACUTE ONLY): 40 min ? ? ?Charges:   PT Evaluation ?$PT Eval Moder

## 2022-02-14 NOTE — Progress Notes (Signed)
? ?                              [facility] Progress Note ? ?Patient ID: ?[name] ?MRN: [id number] ?DOB/AGE: 02/22/1931 88 y.o. ? ?Subjective: ? ?Doing ok post op  ?No acute issues of note ?Denies hip pain  ? ?ROS ?As above ?Objective:  ? ?VITALS:   ?Vitals:  ? 02/13/22 2358 02/14/22 3267 02/14/22 1245 02/14/22 8099  ?BP: (!) 106/58 (!) 108/57 (!) 110/55   ?Pulse: (!) 58 60 (!) 59 60  ?Resp: '18 18 17 16  '$ ?Temp:   (!) 97.5 ?F (36.4 ?C)   ?TempSrc:   Oral   ?SpO2: 95% 100% 99%   ?Weight:      ?Height:      ? ? ?Estimated body mass index is 27.89 kg/m? as calculated from the following: ?  Height as of this encounter: '5\' 11"'$  (1.803 m). ?  Weight as of this encounter: 90.7 kg. ? ? ?Intake/Output   ?   04/25 0701 ?04/26 0700 04/26 0701 ?04/27 0700  ? I.V. (mL/kg) 800 (8.8)   ? IV Piggyback 250   ? Total Intake(mL/kg) 1050 (11.6)   ? Urine (mL/kg/hr) 200 (0.1)   ? Blood 25   ? Total Output 225   ? Net +825   ?     ?  ? ?LABS ? ?Results for orders placed or performed during the hospital encounter of 02/12/22 (from the past 24 hour(s))  ?Osmolality     Status: Abnormal  ? Collection Time: 02/13/22  9:57 AM  ?Result Value Ref Range  ? Osmolality 299 (H) 275 - 295 mOsm/kg  ?Surgical pcr screen     Status: None  ? Collection Time: 02/13/22 12:59 PM  ? Specimen: Nasal Mucosa; Nasal Swab  ?Result Value Ref Range  ? MRSA, PCR NEGATIVE NEGATIVE  ? Staphylococcus aureus NEGATIVE NEGATIVE  ?Magnesium     Status: None  ? Collection Time: 02/14/22  2:16 AM  ?Result Value Ref Range  ? Magnesium 2.2 1.7 - 2.4 mg/dL  ?Comprehensive metabolic panel     Status: Abnormal  ? Collection Time: 02/14/22  2:16 AM  ?Result Value Ref Range  ? Sodium 135 135 - 145 mmol/L  ? Potassium 3.7 3.5 - 5.1 mmol/L  ? Chloride 104 98 - 111 mmol/L  ? CO2 23 22 - 32 mmol/L  ? Glucose, Bld 183 (H) 70 - 99 mg/dL  ? BUN 43 (H) 8 - 23 mg/dL  ? Creatinine, Ser 1.38 (H) 0.61 - 1.24 mg/dL  ? Calcium 8.3  (L) 8.9 - 10.3 mg/dL  ? Total Protein 5.8 (L) 6.5 - 8.1 g/dL  ? Albumin 2.7 (L) 3.5 - 5.0 g/dL  ? AST 21 15 - 41 U/L  ? ALT 14 0 - 44 U/L  ? Alkaline Phosphatase 52 38 - 126 U/L  ? Total Bilirubin 0.7 0.3 - 1.2 mg/dL  ? GFR, Estimated 49 (L) >60 mL/min  ? Anion gap 8 5 - 15  ?CBC with Differential/Platelet     Status: Abnormal  ? Collection Time: 02/14/22  2:16 AM  ?Result Value Ref Range  ? WBC 8.0 4.0 - 10.5 K/uL  ? RBC 2.67 (L) 4.22 - 5.81 MIL/uL  ? Hemoglobin 9.3 (L) 13.0 - 17.0 g/dL  ? HCT 26.7 (L) 39.0 - 52.0 %  ? MCV 100.0 80.0 - 100.0 fL  ? MCH 34.8 (H) 26.0 -  34.0 pg  ? MCHC 34.8 30.0 - 36.0 g/dL  ? RDW 13.2 11.5 - 15.5 %  ? Platelets 85 (L) 150 - 400 K/uL  ? nRBC 0.0 0.0 - 0.2 %  ? Neutrophils Relative % 95 %  ? Neutro Abs 7.6 1.7 - 7.7 K/uL  ? Lymphocytes Relative 3 %  ? Lymphs Abs 0.2 (L) 0.7 - 4.0 K/uL  ? Monocytes Relative 2 %  ? Monocytes Absolute 0.2 0.1 - 1.0 K/uL  ? Eosinophils Relative 0 %  ? Eosinophils Absolute 0.0 0.0 - 0.5 K/uL  ? Basophils Relative 0 %  ? Basophils Absolute 0.0 0.0 - 0.1 K/uL  ? Immature Granulocytes 0 %  ? Abs Immature Granulocytes 0.03 0.00 - 0.07 K/uL  ? ?*Note: Due to a large number of results and/or encounters for the requested time period, some results have not been displayed. A complete set of results can be found in Results Review.  ? ? ? ?PHYSICAL EXAM:  ? ?Gen: Sitting up in bed, no acute distress.  About to work with therapy ?Lungs: Unlabored ?Ext:  ?     Right lower extremity ? Dressings are stable with scant strikethrough noted ? Extremity is warm ? Expected degree of swelling ? Distal motor and sensory functions grossly intact ? No other acute findings noted on exam ? ? ?Assessment/Plan: ?1 Day Post-Op  ? ? ?Anti-infectives (From admission, onward)  ? ? Start     Dose/Rate Route Frequency Ordered Stop  ? 02/14/22 0600  ceFAZolin (ANCEF) IVPB 2g/100 mL premix       ? 2 g ?200 mL/hr over 30 Minutes Intravenous On call to O.R. 02/13/22 1241 02/13/22 1452  ?  02/13/22 1800  ceFAZolin (ANCEF) IVPB 2g/100 mL premix       ? 2 g ?200 mL/hr over 30 Minutes Intravenous Every 6 hours 02/13/22 1709 02/14/22 0106  ? ?  ?. ? ?POD/HD#: 7 ? ?88 year old male ground-level fall with right hip fracture ? ?-Fall ? ?-Right hip fracture s/p IMN  ? Weight-bear as tolerated with assistance ? No range of motion restrictions ? Therapies ? Dressing changes as needed starting tomorrow ? ?- Pain management: ? Multimodal ? Minimize narcotics ? ?- ABL anemia/Hemodynamics ? Monitor ? Did receive TXA perioperatively ? ?- Medical issues  ? Per primary ?- DVT/PE prophylaxis: ? Would recommend Lovenox at discharge if going to SNF x 30 days ?- ID:  ? Preoperative antibiotics ? ?- Metabolic Bone Disease: ? Vitamin D levels look okay ? However fracture is suggestive of osteoporosis given mechanism.  This is a fragility fracture ? Referred to outpatient osteoporosis clinic ? ?- Activity: ? As above ? ?- Dispo: ? Ortho issues addressed ? Sutures out about 2 weeks from date of surgery ? ? ? ?Jari Pigg, PA-C ?[phone] (C) ?02/14/2022, 9:22 AM ? ?[facility] ?[place] [address] ?[phone] Jenetta Downer) ?[phone] (F) ? ? ? ?After 5pm and on the weekends please log on to Amion, go to orthopaedics and the look under the [facility] Call for the provider(s) on call. You can also call our office at [phone] and then follow the prompts to be connected to the call team.  ? Patient ID: [name], male   DOB: 07-30-1931, 88 y.o.   MRN: [id number] ? ?

## 2022-02-14 NOTE — Evaluation (Signed)
Occupational Therapy Evaluation ?Patient Details ?Name: [name] ?MRN: [id number] ?DOB: [birth year] ?Today's Date: 02/14/2022 ? ? ?History of Present Illness Pt is an 88 y/o male who presents s/p fall out of bed, sustaining a R hip fracture. He is now s/p R IM nail on 02/13/2022. PMH significant for AAA s/p repair 2002, AICD, CAD, CHF, COPD, DJD, gout, HTN, osteoporosis, PVD.  ? ?Clinical Impression ?  ?Pt admitted for concerns/procedures listed above. PTA pt's family reported that he was ambulating with a cane and sometimes RW, as well as managing his BADL's. At this time, pt is requiring +2 max assist for all functional mobility, as well as LB ADL's. Pt tolerating pain well, however limited with mobility due to pain and fear of falling, as pt was retropulsing in standing. Recommending SNF to maximize his independence and safety. OT will follow acutely.   ?   ? ?Recommendations for follow up therapy are one component of a multi-disciplinary discharge planning process, led by the attending physician.  Recommendations may be updated based on patient status, additional functional criteria and insurance authorization.  ? ?Follow Up Recommendations ? Skilled nursing-short term rehab (<3 hours/day)  ?  ?Assistance Recommended at Discharge Frequent or constant Supervision/Assistance  ?Patient can return home with the following Two people to help with walking and/or transfers;Two people to help with bathing/dressing/bathroom;Assistance with cooking/housework;Assistance with feeding;Direct supervision/assist for medications management;Direct supervision/assist for financial management;Assist for transportation;Help with stairs or ramp for entrance ? ?  ?Functional Status Assessment ? Patient has had a recent decline in their functional status and demonstrates the ability to make significant improvements in function in a reasonable and predictable amount of time.  ?Equipment Recommendations ? None recommended by OT  ?   ?Recommendations for Other Services   ? ? ?  ?Precautions / Restrictions Precautions ?Precautions: Fall ?Restrictions ?Weight Bearing Restrictions: Yes ?RLE Weight Bearing: Weight bearing as tolerated  ? ?  ? ?Mobility Bed Mobility ?Overal bed mobility: Needs Assistance ?Bed Mobility: Supine to Sit ?  ?  ?Supine to sit: Max assist, HOB elevated ?  ?  ?General bed mobility comments: Pt requiring assist to move BLE off the bed and assist to elevate trunk and scoot forward on EOB ?  ? ?Transfers ?Overall transfer level: Needs assistance ?Equipment used: Rolling walker (2 wheels), Ambulation equipment used ?Transfers: Sit to/from Stand, Bed to chair/wheelchair/BSC ?Sit to Stand: Max assist, +2 physical assistance, +2 safety/equipment ?Stand pivot transfers: Total assist, +2 physical assistance, +2 safety/equipment ?  ?  ?  ?  ?General transfer comment: Pt stood with max A +2 x2 from elevated bed. Pt does better with standing and leaning forward onto his feet when supporting himself on the RW compared to hand held assist. Pt unable to shift weight to complete a pivot transfer. Used stedy to  complete transfer, mod A to pull up to standing in stedy. ?Transfer via Lift Equipment: Stedy ? ?  ?Balance Overall balance assessment: Needs assistance ?Sitting-balance support: Feet supported, No upper extremity supported ?Sitting balance-Leahy Scale: Fair ?Sitting balance - Comments: initially with a posterior lean, able to correct with multimodal cues and requiring only min support EOB ?  ?Standing balance support: Bilateral upper extremity supported, During functional activity, Reliant on assistive device for balance ?Standing balance-Leahy Scale: Poor ?Standing balance comment: mod-max assist required throughout standing activity. ?  ?  ?  ?  ?  ?  ?  ?  ?  ?  ?  ?   ? ?  ADL either performed or assessed with clinical judgement  ? ?ADL Overall ADL's : Needs assistance/impaired ?Eating/Feeding: Minimal assistance;Sitting ?   ?Grooming: Minimal assistance;Sitting ?  ?Upper Body Bathing: Moderate assistance;Sitting ?  ?Lower Body Bathing: Maximal assistance;Sitting/lateral leans;Sit to/from stand;+2 for safety/equipment;+2 for physical assistance ?  ?Upper Body Dressing : Moderate assistance;Sitting ?  ?Lower Body Dressing: Maximal assistance;Sitting/lateral leans;Sit to/from stand;+2 for physical assistance;+2 for safety/equipment ?  ?Toilet Transfer: Maximal assistance;+2 for physical assistance;+2 for safety/equipment;Stand-pivot ?  ?Toileting- Clothing Manipulation and Hygiene: Maximal assistance;+2 for physical assistance;+2 for safety/equipment;Sitting/lateral lean;Sit to/from stand ?  ?  ?  ?  ?General ADL Comments: Pt requiring increased time to process and complete tasks, as well as increased assist due to retropulsion, pain, and weakness  ? ? ? ?Vision Baseline Vision/History: 1 Wears glasses ?Ability to See in Adequate Light: 0 Adequate ?Patient Visual Report: No change from baseline ?Vision Assessment?: No apparent visual deficits  ?   ?Perception   ?  ?Praxis   ?  ? ?Pertinent Vitals/Pain Pain Assessment ?Pain Assessment: Faces ?Faces Pain Scale: Hurts even more ?Pain Location: R hip ?Pain Descriptors / Indicators: Operative site guarding, Grimacing, Sore ?Pain Intervention(s): Monitored during session, Limited activity within patient's tolerance, Repositioned  ? ? ? ?Hand Dominance Right ?  ?Extremity/Trunk Assessment Upper Extremity Assessment ?Upper Extremity Assessment: Overall WFL for tasks assessed ?  ?Lower Extremity Assessment ?Lower Extremity Assessment: RLE deficits/detail ?RLE Deficits / Details: Acute pain, decreased strength, and AROM consistent with above mentioned surgery. ?  ?Cervical / Trunk Assessment ?Cervical / Trunk Assessment: Kyphotic ?Cervical / Trunk Exceptions: Forward head posture with rounded shoulders. ?  ?Communication Communication ?Communication: HOH ?  ?Cognition Arousal/Alertness:  Awake/alert ?Behavior During Therapy: [facility] for tasks assessed/performed ?Overall Cognitive Status: History of cognitive impairments - at baseline ?Area of Impairment: Orientation, Following commands, Safety/judgement, Awareness, Problem solving ?  ?  ?  ?  ?  ?  ?  ?  ?Orientation Level: Disoriented to, Time, Situation ?  ?  ?Following Commands: Follows one step commands consistently, Follows one step commands with increased time, Follows multi-step commands inconsistently ?Safety/Judgement: Decreased awareness of safety ?Awareness: Intellectual ?Problem Solving: Slow processing, Decreased initiation, Difficulty sequencing, Requires verbal cues, Requires tactile cues ?General Comments: Pt chart reports age related cognitive decline. This session, pt also demonstrating difficulty recalling home set up or prior level of function. ?  ?  ?General Comments  VSS on RA ? ?  ?Exercises   ?  ?Shoulder Instructions    ? ? ?Home Living Family/patient expects to be discharged to:: Private residence ?Living Arrangements: Alone ?Available Help at Discharge: Personal care attendant;Available PRN/intermittently ?Type of Home: House ?Home Access: Stairs to enter ?Entrance Stairs-Number of Steps: 3 steps ?  ?Home Layout: One level ?  ?  ?Bathroom Shower/Tub: Tub/shower unit ?  ?Bathroom Toilet: Standard ?  ?  ?Home Equipment: Conservation officer, nature (2 wheels);Cane - quad;Shower seat ?  ?Additional Comments: Pt has an aide 6 days a week ?  ? ?  ?Prior Functioning/Environment Prior Level of Function : Independent/Modified Independent ?  ?  ?  ?  ?  ?  ?Mobility Comments: Daughter reports pt was ambulating with a cane in public and occasionally with the RW in the house but oftentimes would forget about using the walker when up and around his home. ?ADLs Comments: Daughter reports pt was taking care of meals, dressing, and washing up on his own but she was unsure the exact level of independence he  was at PTA. ?  ? ?  ?  ?OT Problem List:  Decreased strength;Decreased activity tolerance;Impaired balance (sitting and/or standing);Decreased cognition;Decreased safety awareness;Decreased knowledge of use of DME or AE;Pain ?  ?   ?OT Treatment/Interventions: Self-c

## 2022-02-14 NOTE — Anesthesia Postprocedure Evaluation (Signed)
Anesthesia Post Note ? ?Patient: [name] ? ?Procedure(s) Performed: INTRAMEDULLARY (IM) NAIL INTERTROCHANTRIC (Right) ? ?  ? ?Patient location during evaluation: PACU ?Anesthesia Type: General ?Level of consciousness: awake and alert ?Pain management: pain level controlled ?Vital Signs Assessment: post-procedure vital signs reviewed and stable ?Respiratory status: spontaneous breathing, nonlabored ventilation, respiratory function stable and patient connected to nasal cannula oxygen ?Cardiovascular status: blood pressure returned to baseline and stable ?Postop Assessment: no apparent nausea or vomiting ?Anesthetic complications: no ? ? ?No notable events documented. ? ?Last Vitals:  ?Vitals:  ? 02/14/22 0627 02/14/22 0752  ?BP: (!) 108/57 (!) 110/55  ?Pulse: 60 (!) 59  ?Resp: 18 17  ?Temp:  (!) 36.4 ?C  ?SpO2: 100% 99%  ?  ?Last Pain:  ?Vitals:  ? 02/14/22 0752  ?TempSrc: Oral  ?PainSc:   ? ? ?  ?  ?  ?  ?  ?  ? ?Cale Decarolis L Elisama Thissen ? ? ? ? ?

## 2022-02-14 NOTE — Evaluation (Signed)
Clinical/Bedside Swallow Evaluation ?Patient Details  ?Name: [name] ?MRN: [id number] ?Date of Birth: 04-Jul-1931 ? ?Today's Date: 02/14/2022 ?Time: SLP Start Time (ACUTE ONLY): 0354 SLP Stop Time (ACUTE ONLY): 0940 ?SLP Time Calculation (min) (ACUTE ONLY): 20 min ? ?Past Medical History:  ?Past Medical History:  ?Diagnosis Date  ? AAA (abdominal aortic aneurysm) ([place])   ? a. 2002 s/p repair.  ? AICD (automatic cardioverter/defibrillator) present   ? Allergic rhinitis   ? Back pain   ? CAD (coronary artery disease)   ? a. 02/2002 H/o MI with stenting x 2; b. 04/2013 MV: EF 25%, large posterior lateral infarct w/o ischemia; c. 03/2016 VT Arrest/Cath: LM 60ost, LAD 40p/m ISR, LCX 100p/m, RCA nl-->Med Rx.  ? Carotid arterial disease ([place])   ? a. 12/2010 s/p R CEA;  b. 05/2015 Carotid U/S: bilat <40% ICA stenosis.  ? Chronic combined systolic and diastolic CHF (congestive heart failure) ([place])   ? a. 03/2016 Echo: Ef 35-40%, grade 1 DD.  ? Compression fracture   ? COPD (chronic obstructive pulmonary disease) ([place])   ? Dermatitis   ? Diverticulosis of colon   ? DJD (degenerative joint disease)   ? and Gout  ? Hemorrhoids   ? History of colonic polyps   ? History of gout   ? History of pneumonia   ? Hypercholesterolemia   ? Hypertension   ? Hypertensive heart disease   ? Ischemic cardiomyopathy   ? a. EF prev <35%-->improved to normal by Echo 8/14:  Mild LVH, focal basal hypertrophy, EF 60-65%, normal wall motion, mild BAE, PASP 36; c. 03/2016 Echo: EF 35-40%, Gr1 DD, triv AI, mild MR, mod dil LA, mild-mod TR, PASP 25mHg.  ? Osteoporosis   ? [place] vascular [place]   ? Presence of cardiac defibrillator   ? a. 03/2008 s/p MDT [id number] Maximo II DR, DC AICD; b. 03/2013: ICD shock for T wave oversensing;  c. 10/2015: collective decision not to replace ICD given improvement in LV fxn; d. VT Arrest-->Gen change to MDT ser # [id number].  ? Presence of permanent cardiac pacemaker   ? Skin cancer   ? shoulders and forehead   ? Syncope   ? T wave over sensing resulting in inappropriate shocks   ? a. 03/2013.  ? Tobacco abuse   ? [place] tachycardia ([place] 04/01/2016  ? ?Past Surgical History:  ?Past Surgical History:  ?Procedure Laterality Date  ? ABDOMINAL AORTIC ANEURYSM REPAIR  2002  ? by Dr. LKellie Simmering ? aicd placed  03/2008  ? Dr. KCaryl Comes ? cad stent  02/2002  ? Dr. HPercival Spanish ? CARDIAC CATHETERIZATION    ? X 2 stents  ? CARDIAC CATHETERIZATION N/A 04/03/2016  ? Procedure: Left Heart Cath and Coronary Angiography;  Surgeon: HBelva Crome MD;  [facility];  Service: Cardiovascular;  Laterality: N/A;  ? CARDIAC DEFIBRILLATOR PLACEMENT  2009  ? CARDIAC DEFIBRILLATOR PLACEMENT    ? CAROTID ENDARTERECTOMY Right January 02, 2011  ? EAR CYST EXCISION Left 12/13/2015  ? Procedure: Excision left ear lesion ;  Surgeon: JMelissa Montane MD;  [facility]  Service: ENT;  Laterality: Left;  ? EP IMPLANTABLE DEVICE N/A 04/06/2016  ? Procedure:  ICD Generator Changeout;  Surgeon: SDeboraha Sprang MD;  [facility];  Service: Cardiovascular;  Laterality: N/A;  ? EXCISION OF LESION LEFT EAR Left 12/13/2015  ? EXTERNAL EAR SURGERY Left   ? Cancer Removal from left ear  ?  I & D EXTREMITY Left 04/23/2016  ? Procedure: IRRIGATION AND DEBRIDEMENT HAND;  Surgeon: Leandrew Koyanagi, MD;  Location: [facility];  Service: Orthopedics;  Laterality: Left;  ? IR IMAGE GUIDED DRAINAGE BY PERCUTANEOUS CATHETER  10/08/2017  ? lumbar back    ? done after compression fracture  ? OPEN REDUCTION INTERNAL FIXATION (ORIF) METACARPAL Left 04/04/2016  ? Procedure: OPEN REDUCTION INTERNAL FIXATION (ORIF) LEFT 2ND, 3RD, 4TH METACARPAL FRACTURE;  Surgeon: Leandrew Koyanagi, MD;  Location: [facility];  Service: Orthopedics;  Laterality: Left;  OPEN REDUCTION INTERNAL FIXATION (ORIF) LEFT 2ND, 3RD, 4TH METACARPAL FRACTURE  ? OPEN REDUCTION INTERNAL FIXATION (ORIF) METACARPAL Left 04/23/2016  ? Procedure: REVISION OPEN REDUCTION INTERNAL FIXATION (ORIF) 2ND METACARPAL;  Surgeon:  Leandrew Koyanagi, MD;  Location: [facility];  Service: Orthopedics;  Laterality: Left;  ? right corotid enderectomy  12/2010  ? Dr. Scot Dock  ? SKIN SPLIT GRAFT Left 12/13/2015  ? Procedure: with possible skin graft;  Surgeon: Melissa Montane, MD;  Location: [facility];  Service: ENT;  Laterality: Left;  ? SQUAMOUS CELL CARCINOMA EXCISION    ? on back, forehead, arm  ? TONSILLECTOMY    ? ?HPI:  ?88 y.o. male, with H/O tobacco dependence - quit; AICD placement for NICM; PVD; HTN; HLD; COPD; chronic combined CHF; CAD s/p stenting; AAA s/p repair (2002); carotid disease, LGIB, age-related cognitive decline, recent urinary retention Foley catheter was placed yesterday in the ER, recent dental procedure 1 week ago after which he has been increasingly getting more confused at home for the last 1 week.  Mechanical fall at home fractured his right hip and was admitted to the hospital for right hip fracture. Underwent surgery on 4/25. Pt does have ahistory os severe dysphagia  with MBS 10/02/17 showing gross residuals, mild aspiration of thin liquids that couldn't be cleared (weak cough in response), and poor awareness/insight into deficits. He was started on full liquid diet, thickened to nectar thick, using a chin tuck and straw; tsps of water also okay. He was advanced to Dys 3 diet based on [place] decisions. In his following visits pt stated he wished to continue eating and drinking thin liquids and regualr soldis with known risk.  ?  ?Assessment / Plan / Recommendation  ?Clinical Impression ? Pt demonstrates signs of a pharygneal dysphagia including immediate and delayed coughing that are consistent with pts known history of dysphagia. He has repeatedly stated he does not want to modify liquids and daughter confirms this. In addition, pt has experienced a significant cognitive change since a recent dental procedure and is not demosntrating prolonged masticaiton of solids, oral pocketing and decreased ability to initiate self feeding and oral  transit. Pt was unable to swallow a well masticated bite of  ham and cheese omelet, though he did toelrate a graham cracker followed by sips. Recommend a downgrade to dys 1 for now, with known risk of aspiration of thin liquids and aspiration precautions (posted). Pt will hopefully experience improved mentation for diet upgrade. Will f/u. ?SLP Visit Diagnosis: Dysphagia, oral phase (R13.11);Dysphagia, oropharyngeal phase (R13.12) ?   ?Aspiration Risk ? Severe aspiration risk;Risk for inadequate nutrition/hydration  ?  ?Diet Recommendation Dysphagia 1 (Puree);Thin liquid  ? ?Liquid Administration via: Cup;Straw ?Supervision: Patient able to self feed ?Compensations: Minimize environmental distractions;Slow rate;Small sips/bites;Follow solids with liquid  ?  ?Other  Recommendations Oral Care Recommendations: Oral care BID   ? ?Recommendations for follow up therapy are one component of a multi-disciplinary discharge planning process,  led by the attending physician.  Recommendations may be updated based on patient status, additional functional criteria and insurance authorization. ? ?Follow up Recommendations Skilled nursing-short term rehab (<3 hours/day)  ? ? ?  ?Assistance Recommended at Discharge    ?Functional Status Assessment    ?Frequency and Duration    ?  ?  ?   ? ?Prognosis Prognosis for Safe Diet Advancement: Fair ?Barriers to Reach Goals: Cognitive deficits  ? ?  ? ?Swallow Study   ?General HPI: 88 y.o. male, with H/O tobacco dependence - quit; AICD placement for NICM; PVD; HTN; HLD; COPD; chronic combined CHF; CAD s/p stenting; AAA s/p repair (2002); carotid disease, LGIB, age-related cognitive decline, recent urinary retention Foley catheter was placed yesterday in the ER, recent dental procedure 1 week ago after which he has been increasingly getting more confused at home for the last 1 week.  Mechanical fall at home fractured his right hip and was admitted to the hospital for right hip fracture.  Underwent surgery on 4/25. Pt does have ahistory os severe dysphagia  with MBS 10/02/17 showing gross residuals, mild aspiration of thin liquids that couldn't be cleared (weak cough in response), and poor aware

## 2022-02-14 NOTE — Plan of Care (Signed)
?  Problem: Education: Goal: Knowledge of General Education information will improve Description: Including pain rating scale, medication(s)/side effects and non-pharmacologic comfort measures Outcome: Progressing   Problem: Activity: Goal: Risk for activity intolerance will decrease Outcome: Progressing   Problem: Nutrition: Goal: Adequate nutrition will be maintained Outcome: Progressing   Problem: Coping: Goal: Level of anxiety will decrease Outcome: Progressing   Problem: Elimination: Goal: Will not experience complications related to bowel motility Outcome: Progressing   Problem: Pain Managment: Goal: General experience of comfort will improve Outcome: Progressing   

## 2022-02-15 ENCOUNTER — Other Ambulatory Visit (HOSPITAL_COMMUNITY): Payer: Self-pay

## 2022-02-15 DIAGNOSIS — R4181 Age-related cognitive decline: Secondary | ICD-10-CM | POA: Diagnosis not present

## 2022-02-15 LAB — CBC WITH DIFFERENTIAL/PLATELET
Abs Immature Granulocytes: 0.07 10*3/uL (ref 0.00–0.07)
Basophils Absolute: 0 10*3/uL (ref 0.0–0.1)
Basophils Relative: 0 %
Eosinophils Absolute: 0.1 10*3/uL (ref 0.0–0.5)
Eosinophils Relative: 1 %
HCT: 25.4 % — ABNORMAL LOW (ref 39.0–52.0)
Hemoglobin: 8.9 g/dL — ABNORMAL LOW (ref 13.0–17.0)
Immature Granulocytes: 1 %
Lymphocytes Relative: 7 %
Lymphs Abs: 0.5 10*3/uL — ABNORMAL LOW (ref 0.7–4.0)
MCH: 35.2 pg — ABNORMAL HIGH (ref 26.0–34.0)
MCHC: 35 g/dL (ref 30.0–36.0)
MCV: 100.4 fL — ABNORMAL HIGH (ref 80.0–100.0)
Monocytes Absolute: 0.5 10*3/uL (ref 0.1–1.0)
Monocytes Relative: 7 %
Neutro Abs: 6.3 10*3/uL (ref 1.7–7.7)
Neutrophils Relative %: 84 %
Platelets: 105 10*3/uL — ABNORMAL LOW (ref 150–400)
RBC: 2.53 MIL/uL — ABNORMAL LOW (ref 4.22–5.81)
RDW: 13.2 % (ref 11.5–15.5)
WBC: 7.5 10*3/uL (ref 4.0–10.5)
nRBC: 0 % (ref 0.0–0.2)

## 2022-02-15 LAB — COMPREHENSIVE METABOLIC PANEL
ALT: 18 U/L (ref 0–44)
AST: 48 U/L — ABNORMAL HIGH (ref 15–41)
Albumin: 2.7 g/dL — ABNORMAL LOW (ref 3.5–5.0)
Alkaline Phosphatase: 55 U/L (ref 38–126)
Anion gap: 8 (ref 5–15)
BUN: 44 mg/dL — ABNORMAL HIGH (ref 8–23)
CO2: 23 mmol/L (ref 22–32)
Calcium: 8.8 mg/dL — ABNORMAL LOW (ref 8.9–10.3)
Chloride: 103 mmol/L (ref 98–111)
Creatinine, Ser: 1.28 mg/dL — ABNORMAL HIGH (ref 0.61–1.24)
GFR, Estimated: 54 mL/min — ABNORMAL LOW (ref >60)
Glucose, Bld: 111 mg/dL — ABNORMAL HIGH (ref 70–99)
Potassium: 4 mmol/L (ref 3.5–5.1)
Sodium: 134 mmol/L — ABNORMAL LOW (ref 135–145)
Total Bilirubin: 0.5 mg/dL (ref 0.3–1.2)
Total Protein: 5.8 g/dL — ABNORMAL LOW (ref 6.5–8.1)

## 2022-02-15 LAB — MAGNESIUM: Magnesium: 2.2 mg/dL (ref 1.7–2.4)

## 2022-02-15 MED ORDER — DOCUSATE SODIUM 100 MG PO CAPS
100.0000 mg | ORAL_CAPSULE | Freq: Two times a day (BID) | ORAL | 0 refills | Status: AC
Start: 2022-02-15 — End: ?

## 2022-02-15 MED ORDER — ENOXAPARIN SODIUM 40 MG/0.4ML IJ SOSY
40.0000 mg | PREFILLED_SYRINGE | INTRAMUSCULAR | Status: DC
  Administered 2022-02-15 – 2022-02-16 (×2): 40 mg via SUBCUTANEOUS
  Filled 2022-02-15 (×2): qty 0.4

## 2022-02-15 MED ORDER — ACETAMINOPHEN 500 MG PO TABS: 500.0000 mg | ORAL_TABLET | Freq: Three times a day (TID) | ORAL | 0 refills | Status: AC

## 2022-02-15 MED ORDER — HYDROCODONE-ACETAMINOPHEN 5-325 MG PO TABS: 1.0000 | ORAL_TABLET | Freq: Three times a day (TID) | ORAL | 0 refills | Status: DC | PRN

## 2022-02-15 MED ORDER — VITAMIN D 125 MCG (5000 UT) PO CAPS: 1.0000 | ORAL_CAPSULE | Freq: Every day | ORAL | 6 refills | Status: AC

## 2022-02-15 MED ORDER — ENSURE ENLIVE PO LIQD: 237.0000 mL | Freq: Three times a day (TID) | ORAL | 12 refills | Status: AC

## 2022-02-15 NOTE — Evaluation (Signed)
Physical Therapy Evaluation ?Patient Details ?Name: [name] ?MRN: [id number] ?DOB: March 31, 1931 ?Today's Date: 02/15/2022 ? ?History of Present Illness ? Pt is an 88 y/o male who presents s/p fall out of bed, sustaining a R hip fracture. He is now s/p R IM nail on 02/13/2022. PMH significant for AAA s/p repair 2002, AICD, CAD, CHF, COPD, DJD, gout, HTN, osteoporosis, PVD.  ?Clinical Impression ? Patient is progressing towards physical therapy goals. Pt required mod Ax2 for bed mobility, and mod Ax2 to max Ax2 for transfers. Stedy used for initial transfers so for energy conservation to attempt ambulation. Pt able to ambulate today using RW and mod Ax2. Pt L knee occasionally buckling on first 5 foot bout. Pt limited by fatigue. Current recommendations remains appropriate. Pt will benefit from continued skilled PT. ? ?   ?   ? ?Recommendations for follow up therapy are one component of a multi-disciplinary discharge planning process, led by the attending physician.  Recommendations may be updated based on patient status, additional functional criteria and insurance authorization. ? ?Follow Up Recommendations Skilled nursing-short term rehab (<3 hours/day) ? ?  ?Assistance Recommended at Discharge Frequent or constant Supervision/Assistance  ?Patient can return home with the following ? Two people to help with walking and/or transfers;Two people to help with bathing/dressing/bathroom;Assistance with cooking/housework;Assist for transportation;Help with stairs or ramp for entrance ? ?  ?Equipment Recommendations None recommended by PT (TBD next venue of care)  ?Recommendations for Other Services ?    ?  ?Functional Status Assessment    ? ?  ?Precautions / Restrictions Precautions ?Precautions: Fall ?Restrictions ?Weight Bearing Restrictions: Yes ?RLE Weight Bearing: Weight bearing as tolerated  ? ?  ? ?Mobility ? Bed Mobility ?Overal bed mobility: Needs Assistance ?Bed Mobility: Supine to Sit ?  ?  ?Supine to sit:  Mod assist, +2 for physical assistance, +2 for safety/equipment ?  ?  ?General bed mobility comments: Pt requiring mod Ax2 to move BLE off the bed and assist to elevate trunk and scoot forward on EOB. ?  ? ?Transfers ?Overall transfer level: Needs assistance ?Equipment used: Ambulation equipment used, Rolling walker (2 wheels) ?Transfers: Sit to/from Stand, Bed to chair/wheelchair/BSC ?Sit to Stand: Max assist, +2 physical assistance, +2 safety/equipment ?  ?  ?  ?  ?  ?General transfer comment: First transfer: pt stood with max Ax2 from elevated bed using stedy for UE support. Stedy used to transfer pt to recliner. Second transfer: pt stood with max Ax2 from recliner using RW for UE support. 3rd transfer: pt requiring mod Ax2 with increased trunk stability and upright posture. ?Transfer via Lift Equipment: Stedy ? ?Ambulation/Gait ?Ambulation/Gait assistance: Mod assist, +2 physical assistance, +2 safety/equipment ?Gait Distance (Feet): 5 Feet (x2) ?Assistive device: Rolling walker (2 wheels) ?Gait Pattern/deviations: Step-through pattern, Knee flexed in stance - left, Knees buckling, Trunk flexed, Narrow base of support ?Gait velocity: decreased ?Gait velocity interpretation: <1.31 ft/sec, indicative of household ambulator ?  ?General Gait Details: pt required max cues for step coordination and weight shifting. Mod Ax2 used for pt trunk support and blocking L knee when occassionally buckled during first 5 foot bout of ambulation ? ?Stairs ?  ?  ?  ?  ?  ? ?Wheelchair Mobility ?  ? ?Modified Rankin (Stroke Patients Only) ?  ? ?  ? ?Balance Overall balance assessment: Needs assistance ?Sitting-balance support: Feet supported, No upper extremity supported ?Sitting balance-Leahy Scale: Fair ?Sitting balance - Comments: initially with a posterior lean, able to correct with  multimodal cues and requiring only min guard at EOB ?  ?Standing balance support: Bilateral upper extremity supported, During functional activity,  Reliant on assistive device for balance ?Standing balance-Leahy Scale: Zero ?Standing balance comment: mod-max assist x2 required throughout standing activity. ?  ?  ?  ?  ?  ?  ?  ?  ?  ?  ?  ?   ? ? ? ?Pertinent Vitals/Pain Pain Assessment ?Pain Assessment: Faces ?Faces Pain Scale: Hurts a little bit ?Pain Location: R hip ?Pain Descriptors / Indicators: Operative site guarding, Grimacing, Sore ?Pain Intervention(s): Monitored during session  ? ? ?Home Living   ?  ?  ?  ?  ?  ?  ?  ?  ?  ?   ?  ?Prior Function   ?  ?  ?  ?  ?  ?  ?  ?  ?  ? ? ?Hand Dominance  ?   ? ?  ?Extremity/Trunk Assessment  ?   ?  ? ?  ?  ? ?   ?Communication  ?    ?Cognition Arousal/Alertness: Awake/alert ?Behavior During Therapy: [facility] for tasks assessed/performed ?Overall Cognitive Status: History of cognitive impairments - at baseline ?Area of Impairment: Orientation, Following commands, Safety/judgement, Awareness, Problem solving ?  ?  ?  ?  ?  ?  ?  ?  ?Orientation Level: Disoriented to, Time, Situation ?  ?  ?Following Commands: Follows one step commands consistently, Follows one step commands with increased time, Follows multi-step commands inconsistently ?Safety/Judgement: Decreased awareness of safety ?Awareness: Intellectual ?Problem Solving: Slow processing, Decreased initiation, Difficulty sequencing, Requires verbal cues, Requires tactile cues ?  ?  ?  ? ?  ?General Comments   ? ?  ?Exercises General Exercises - Lower Extremity ?Long Arc Quad: Right, 5 reps ?Heel Slides: Right, 10 reps ?Hip ABduction/ADduction: Right, 10 reps  ? ?Assessment/Plan  ?  ?PT Assessment    ?PT Problem List   ? ?   ?  ?PT Treatment Interventions     ? ?PT Goals (Current goals can be found in the Care Plan section)  ?Acute Rehab PT Goals ?Patient Stated Goal: Feel better ?PT Goal Formulation: With patient/family ?Time For Goal Achievement: 02/28/22 ?Potential to Achieve Goals: Good ? ?  ?Frequency Min 3X/week ?  ? ? ?Co-evaluation   ?  ?  ?  ?  ? ? ?   ?AM-PAC PT "6 Clicks" Mobility  ?Outcome Measure Help needed turning from your back to your side while in a flat bed without using bedrails?: A Lot ?Help needed moving from lying on your back to sitting on the side of a flat bed without using bedrails?: Total ?Help needed moving to and from a bed to a chair (including a wheelchair)?: Total ?Help needed standing up from a chair using your arms (e.g., wheelchair or bedside chair)?: Total ?Help needed to walk in hospital room?: Total ?Help needed climbing 3-5 steps with a railing? : Total ?6 Click Score: 7 ? ?  ?End of Session Equipment Utilized During Treatment: Gait belt ?Activity Tolerance: Patient tolerated treatment well ?Patient left: in chair;with call bell/phone within reach;with chair alarm set;with family/visitor present ?Nurse Communication: Mobility status;Need for lift equipment ?PT Visit Diagnosis: Unsteadiness on feet (R26.81);Pain;History of falling (Z91.81) ?Pain - Right/Left: Right ?Pain - part of body: Hip ?  ? ?Time: 3710-6269 ?PT Time Calculation (min) (ACUTE ONLY): 41 min ? ? ?Charges:     ?PT Treatments ?$Gait Training: 23-37 mins ?$Therapeutic  Exercise: 8-22 mins ?  ?   ? ? ?Jonne Ply, SPT ? ?Jonne Ply ?02/15/2022, 1:20 PM ? ?

## 2022-02-15 NOTE — TOC Benefit Eligibility Note (Signed)
Patient Advocate Encounter ? ?Insurance verification completed.   ? ?The patient is currently admitted and upon discharge could be taking enoxaparin (Lovenox) 40 mg/0.4 ml. ? ?The current 30 day co-pay is, $63.28.  ? ?The patient is insured through [facility] Part D  ? ? ? ?Lyndel Safe, CPhT ?Pharmacy Patient Advocate Specialist ?[facility] Patient Advocate Team ?Direct Number: [phone]  Fax: [phone] ? ? ? ? ? ?  ?

## 2022-02-15 NOTE — Progress Notes (Signed)
? ?                              Orthopaedic Trauma Service Progress Note ? ?Patient ID: ?Donovyn Guidice ?MRN: [id number] ?DOB/AGE: 04/24/1931 88 y.o. ? ?Subjective: ? ?No complaints  ?Sitting up in chair ? ?ROS ?As above ? ?Objective:  ? ?VITALS:   ?Vitals:  ? 02/14/22 2028 02/14/22 2030 02/15/22 0751 02/15/22 0819  ?BP: 107/61  (!) 128/59   ?Pulse: 61  (!) 59 61  ?Resp: '16  17 16  '$ ?Temp: 97.6 ?F (36.4 ?C)  97.8 ?F (36.6 ?C)   ?TempSrc:   Oral   ?SpO2: 100% 100% 100% 100%  ?Weight:      ?Height:      ? ? ?Estimated body mass index is 27.89 kg/m? as calculated from the following: ?  Height as of this encounter: '5\' 11"'$  (1.803 m). ?  Weight as of this encounter: 90.7 kg. ? ? ?Intake/Output   ?   04/26 0701 ?04/27 0700 04/27 0701 ?04/28 0700  ? I.V. (mL/kg) 1471.4 (16.2)   ? IV Piggyback    ? Total Intake(mL/kg) 1471.4 (16.2)   ? Urine (mL/kg/hr)    ? Blood    ? Total Output    ? Net +1471.4   ?     ?  ? ?LABS ? ?Results for orders placed or performed during the hospital encounter of 02/12/22 (from the past 24 hour(s))  ?Magnesium     Status: None  ? Collection Time: 02/15/22  3:56 AM  ?Result Value Ref Range  ? Magnesium 2.2 1.7 - 2.4 mg/dL  ?Comprehensive metabolic panel     Status: Abnormal  ? Collection Time: 02/15/22  3:56 AM  ?Result Value Ref Range  ? Sodium 134 (L) 135 - 145 mmol/L  ? Potassium 4.0 3.5 - 5.1 mmol/L  ? Chloride 103 98 - 111 mmol/L  ? CO2 23 22 - 32 mmol/L  ? Glucose, Bld 111 (H) 70 - 99 mg/dL  ? BUN 44 (H) 8 - 23 mg/dL  ? Creatinine, Ser 1.28 (H) 0.61 - 1.24 mg/dL  ? Calcium 8.8 (L) 8.9 - 10.3 mg/dL  ? Total Protein 5.8 (L) 6.5 - 8.1 g/dL  ? Albumin 2.7 (L) 3.5 - 5.0 g/dL  ? AST 48 (H) 15 - 41 U/L  ? ALT 18 0 - 44 U/L  ? Alkaline Phosphatase 55 38 - 126 U/L  ? Total Bilirubin 0.5 0.3 - 1.2 mg/dL  ? GFR, Estimated 54 (L) >60 mL/min  ? Anion gap 8 5 - 15  ?CBC with Differential/Platelet     Status: Abnormal  ? Collection Time: 02/15/22  3:56 AM  ?Result  Value Ref Range  ? WBC 7.5 4.0 - 10.5 K/uL  ? RBC 2.53 (L) 4.22 - 5.81 MIL/uL  ? Hemoglobin 8.9 (L) 13.0 - 17.0 g/dL  ? HCT 25.4 (L) 39.0 - 52.0 %  ? MCV 100.4 (H) 80.0 - 100.0 fL  ? MCH 35.2 (H) 26.0 - 34.0 pg  ? MCHC 35.0 30.0 - 36.0 g/dL  ? RDW 13.2 11.5 - 15.5 %  ? Platelets 105 (L) 150 - 400 K/uL  ? nRBC 0.0 0.0 - 0.2 %  ? Neutrophils Relative % 84 %  ? Neutro Abs 6.3 1.7 - 7.7 K/uL  ? Lymphocytes Relative 7 %  ? Lymphs Abs 0.5 (L) 0.7 - 4.0 K/uL  ? Monocytes Relative 7 %  ?  Monocytes Absolute 0.5 0.1 - 1.0 K/uL  ? Eosinophils Relative 1 %  ? Eosinophils Absolute 0.1 0.0 - 0.5 K/uL  ? Basophils Relative 0 %  ? Basophils Absolute 0.0 0.0 - 0.1 K/uL  ? Immature Granulocytes 1 %  ? Abs Immature Granulocytes 0.07 0.00 - 0.07 K/uL  ? ?*Note: Due to a large number of results and/or encounters for the requested time period, some results have not been displayed. A complete set of results can be found in Results Review.  ? ? ? ?PHYSICAL EXAM:  ? ?Gen: Sitting up in chair, no acute distress ?Lungs: Unlabored ?Ext:  ?     Right lower extremity ?            Dressings are stable with scant strikethrough noted ? Proximal hip wound clean and dry  ? Ecchymosis stable to R thigh  ?            Extremity is warm ?            Expected degree of swelling ?            Distal motor and sensory functions grossly intact ?            No other acute findings noted on exam ?  ? ?Assessment/Plan: ?2 Days Post-Op  ? ? ? ? ?Anti-infectives (From admission, onward)  ? ? Start     Dose/Rate Route Frequency Ordered Stop  ? 02/14/22 0600  ceFAZolin (ANCEF) IVPB 2g/100 mL premix       ? 2 g ?200 mL/hr over 30 Minutes Intravenous On call to O.R. 02/13/22 1241 02/13/22 1452  ? 02/13/22 1800  ceFAZolin (ANCEF) IVPB 2g/100 mL premix       ? 2 g ?200 mL/hr over 30 Minutes Intravenous Every 6 hours 02/13/22 1709 02/14/22 0106  ? ?  ?. ? ?POD/HD#: 33 ? ?88 year old male ground-level fall with right hip fracture ?  ?-Fall ?  ?-Right hip fracture s/p IMN   ?            Weight-bear as tolerated with assistance ?            No range of motion restrictions ?            Therapies ?            Dressing changes as needed ?  Ok to shower and clean wounds with soap and water only  ?  Sutures out around 02/27/2022 ?  ?- Pain management: ?            Multimodal ?            Minimize narcotics ?  ?- ABL anemia/Hemodynamics ?            Monitor ?            Did receive TXA perioperatively ?  ?- Medical issues  ?            Per primary ?- DVT/PE prophylaxis: ?            Would recommend Lovenox at discharge if going to SNF x 30 days ?- ID:  ?            Preoperative antibiotics ?  ?- Metabolic Bone Disease: ?            Vitamin D levels look okay ?            However fracture is suggestive of osteoporosis given mechanism.  This is a fragility fracture ?            Referred to outpatient osteoporosis clinic ?  ?- Activity: ?            As above ?  ?- Dispo: ?            Ortho issues stable ? Snf likely tomorrow ? Follow up with ortho in 10-14 days  ?  ? ? ?Jari Pigg, PA-C ?[phone] (C) ?02/15/2022, 1:49 PM ? ?[facility] ?[place] [address] ?[phone] [address]er) ?[phone] (F) ? ? ? ?After 5pm and on the weekends please log on to Amion, go to orthopaedics and the look under the [facility] Call for the provider(s) on call. You can also call our office at [phone] and then follow the prompts to be connected to the call team.  ? Patient ID: Daquann Merriott, male   DOB: 06/26/1931, 88 y.o.   MRN: [id number] ? ?

## 2022-02-15 NOTE — Discharge Instructions (Addendum)
Follow with Primary MD Caren Macadam, MD in 7 days  ? ?Get CBC, CMP, 2 view Chest X ray -  checked next visit within 1 week by SNF MD   ? ?Activity: As tolerated with Full fall precautions use walker/cane & assistance as needed ? ?Disposition SNF ? ?Diet: Dysphagia 1 diet with feeding assistance and aspiration precautions. ? ?Special Instructions: If you have smoked or chewed Tobacco  in the last 2 yrs please stop smoking, stop any regular Alcohol  and or any Recreational drug use. ? ?On your next visit with your primary care physician please Get Medicines reviewed and adjusted. ? ?Please request your Prim.MD to go over all Hospital Tests and Procedure/Radiological results at the follow up, please get all Hospital records sent to your Prim MD by signing hospital release before you go home. ? ?If you experience worsening of your admission symptoms, develop shortness of breath, life threatening emergency, suicidal or homicidal thoughts you must seek medical attention immediately by calling 911 or calling your MD immediately  if symptoms less severe. ? ?You Must read complete instructions/literature along with all the possible adverse reactions/side effects for all the Medicines you take and that have been prescribed to you. Take any new Medicines after you have completely understood and accpet all the possible adverse reactions/side effects.  ?  ? ? ? ? ? ? ?Orthopaedic Trauma Service Discharge Instructions ? ? ?General Discharge Instructions ? ?Orthopaedic Injuries: ? Right hip fracture treated with intramedullary nailing ? ?WEIGHT BEARING STATUS: Weight-bear as tolerated right leg.  We will need to use walker for assistance. ? ?RANGE OF MOTION/ACTIVITY: Unrestricted range of motion right hip and knee.  Activity as tolerated while maintaining restrictions noted above ? ?Bone health: This fracture is a fragility fracture.  You will need a bone density scan in the next 4 to 8 weeks ? ?Review the following  resource for additional information regarding bone health ? ?[email or web address] ? ?Wound Care: Daily wound care as needed.  Can use 4 x 4 gauze and tape or Mepilex dressing which is a silicone foam dressing.  Can leave open to air once there is no drainage ? ?Discharge Wound Care Instructions ? ?Do NOT apply any ointments, solutions or lotions to pin sites or surgical wounds.  These prevent needed drainage and even though solutions like hydrogen peroxide kill bacteria, they also damage cells lining the pin sites that help fight infection.  Applying lotions or ointments can keep the wounds moist and can cause them to breakdown and open up as well. This can increase the risk for infection. When in doubt call the office. ? ?Surgical incisions should be dressed daily. ? ?If any drainage is noted, use one layer of adaptic or Mepitel, then gauze, and tape.  Alternatively you can use a Mepilex dressing which is a silicone foam dressing ? ?[email or web address] ?[email or web address] ? ?[email or web address] ? ?These dressing supplies should be available at local medical supply stores ([employer], [employer], etc). They are not usually carried at places like [facility], [facility], [employer], etc ? ?Once the incision is completely dry and without drainage, it may be left open to air out.  Showering may begin 36-48 hours later.  Cleaning gently with soap and water. ? ?DVT/PE prophylaxis: Lovenox 40 mg sq injection daily x 30 days  ? ?Diet: as you were eating previously.  Can use over the counter stool softeners and bowel preparations, such as Miralax, to help with bowel movements.  Narcotics can be constipating.  Be sure to drink plenty of fluids ? ?PAIN MEDICATION USE AND EXPECTATIONS ? You have  likely been given narcotic medications to help control your pain.  After a traumatic event that results in an fracture (broken bone) with or without surgery, it is ok to use narcotic pain medications to help control one's pain.  We understand that everyone responds to pain differently and each individual patient will be evaluated on a regular basis for the continued need for narcotic medications. Ideally, narcotic medication use should last no more than 6-8 weeks (coinciding with fracture healing).  ? As a patient it is your responsibility as well to monitor narcotic medication use and report the amount and frequency you use these medications when you come to your office visit.  ? We would also advise that if you are using narcotic medications, you should take a dose prior to therapy to maximize you participation. ? ?IF YOU ARE ON NARCOTIC MEDICATIONS IT IS NOT PERMISSIBLE TO OPERATE A MOTOR VEHICLE (MOTORCYCLE/CAR/TRUCK/MOPED) OR HEAVY MACHINERY ?DO NOT MIX NARCOTICS WITH OTHER CNS (CENTRAL NERVOUS SYSTEM) DEPRESSANTS SUCH AS ALCOHOL ? ? ?POST-OPERATIVE OPIOID TAPER INSTRUCTIONS: ?It is important to wean off of your opioid medication as soon as possible. If you do not need pain medication after your surgery it is ok to stop day one. ?Opioids include: ?Codeine, Hydrocodone(Norco, Vicodin), Oxycodone(Percocet, oxycontin) and hydromorphone amongst others.  ?Long term and even short term use of opiods can cause: ?Increased pain response ?Dependence ?Constipation ?Depression ?Respiratory depression ?And more.  ?Withdrawal symptoms can include ?Flu like symptoms ?Nausea, vomiting ?And more ?Techniques to manage these symptoms ?Hydrate well ?Eat regular healthy meals ?Stay active ?Use relaxation techniques(deep breathing, meditating, yoga) ?Do Not substitute Alcohol to help with tapering ?If you have been on opioids for less than two weeks and do not have pain than it is ok to stop all together.  ?Plan to wean off of  opioids ?This plan should start within one week post op of your fracture surgery  ?Maintain the same interval or time between taking each dose and first decrease the dose.  ?Cut the total daily intake of opioids by one tablet each day ?Next start to increase the time between doses. ?The last dose that should be eliminated is the evening dose.  ? ? ?STOP SMOKING OR USING NICOTINE PRODUCTS!!!! ? As discussed nicotine severely impairs your body's ability to heal surgical and traumatic wounds but also impairs bone healing.  Wounds and bone heal by forming microscopic blood vessels (angiogenesis) and nicotine is a vasoconstrictor (essentially, shrinks blood vessels).  Therefore, if vasoconstriction occurs to these microscopic blood vessels they essentially disappear and are unable to deliver necessary nutrients to the healing tissue.  This is one modifiable factor that you can do to dramatically increase your chances of healing your injury.   ? (This means no smoking, no nicotine gum, patches, etc) ? ?DO NOT USE NONSTEROIDAL ANTI-INFLAMMATORY DRUGS (NSAID'S) ? Using products such as Advil (ibuprofen), Aleve (naproxen), Motrin (ibuprofen) for additional pain control during fracture healing can delay and/or prevent the healing response.  If you would like to take over the counter (OTC) medication, Tylenol (acetaminophen) is ok.  However, some narcotic medications that are given for pain control contain acetaminophen as well. Therefore, you should not exceed more than 4000 mg of tylenol in a day if you do not have liver disease.  Also note that there are may OTC medicines, such as cold medicines and allergy medicines that my contain  tylenol as well.  If you have any questions about medications and/or interactions please ask your doctor/PA or your pharmacist.  ?   ? ?ICE AND ELEVATE INJURED/OPERATIVE EXTREMITY ? Using ice and elevating the injured extremity above your heart can help with swelling and pain control.  Icing in  a pulsatile fashion, such as 20 minutes on and 20 minutes off, can be followed.   ? Do not place ice directly on skin. Make sure there is a barrier between to skin and the ice pack.   ? Using frozen items such a

## 2022-02-15 NOTE — Progress Notes (Addendum)
ANTICOAGULATION CONSULT NOTE - Initial Consult ? ?Pharmacy Consult for lovenox ?Indication: VTE prophylaxis ? ?Allergies  ?Allergen Reactions  ? Lisinopril   ?  BP dropped too low per daughter  ? ? ?Patient Measurements: ?Height: '5\' 11"'$  (180.3 cm) ?Weight: 90.7 kg (200 lb) ?IBW/kg (Calculated) : 75.3 ? ? ?Vital Signs: ?Temp: 97.8 ?F (36.6 ?C) (04/27 0751) ?Temp Source: Oral (04/27 0751) ?BP: 128/59 (04/27 0751) ?Pulse Rate: 61 (04/27 0819) ? ?Labs: ?Recent Labs  ?  02/12/22 ?1547 02/13/22 ?6962 02/14/22 ?0216 02/15/22 ?9528  ?HGB 14.0 11.8* 9.3* 8.9*  ?HCT 41.3 32.7* 26.7* 25.4*  ?PLT PLATELET CLUMPS NOTED ON SMEAR, UNABLE TO ESTIMATE 99* 85* 105*  ?LABPROT 15.5*  --   --   --   ?INR 1.2  --   --   --   ?CREATININE 1.52* 1.71* 1.38* 1.28*  ?TROPONINIHS 52*  --   --   --   ? ? ?Estimated Creatinine Clearance: 46 mL/min (A) (by C-G formula based on SCr of 1.28 mg/dL (H)). ? ? ?Medical History: ?Past Medical History:  ?Diagnosis Date  ? AAA (abdominal aortic aneurysm) ([place])   ? a. 2002 s/p repair.  ? AICD (automatic cardioverter/defibrillator) present   ? Allergic rhinitis   ? Back pain   ? CAD (coronary artery disease)   ? a. 02/2002 H/o MI with stenting x 2; b. 04/2013 MV: EF 25%, large posterior lateral infarct w/o ischemia; c. 03/2016 VT Arrest/Cath: LM 60ost, LAD 40p/m ISR, LCX 100p/m, RCA nl-->Med Rx.  ? Carotid arterial disease ([place])   ? a. 12/2010 s/p R CEA;  b. 05/2015 Carotid U/S: bilat <40% ICA stenosis.  ? Chronic combined systolic and diastolic CHF (congestive heart failure) ([place])   ? a. 03/2016 Echo: Ef 35-40%, grade 1 DD.  ? Compression fracture   ? COPD (chronic obstructive pulmonary disease) ([place])   ? Dermatitis   ? Diverticulosis of colon   ? DJD (degenerative joint disease)   ? and Gout  ? Hemorrhoids   ? History of colonic polyps   ? History of gout   ? History of pneumonia   ? Hypercholesterolemia   ? Hypertension   ? Hypertensive heart disease   ? Ischemic cardiomyopathy   ? a. EF prev <35%-->improved  to normal by Echo 8/14:  Mild LVH, focal basal hypertrophy, EF 60-65%, normal wall motion, mild BAE, PASP 36; c. 03/2016 Echo: EF 35-40%, Gr1 DD, triv AI, mild MR, mod dil LA, mild-mod TR, PASP 25mHg.  ? Osteoporosis   ? [place]ipheral vascular disease ([place]   ? Presence of cardiac defibrillator   ? a. 03/2008 s/p MDT [id number] Maximo II DR, DC AICD; b. 03/2013: ICD shock for T wave oversensing;  c. 10/2015: collective decision not to replace ICD given improvement in LV fxn; d. VT Arrest-->Gen change to MDT ser # [id number].  ? Presence of permanent cardiac pacemaker   ? Skin cancer   ? shoulders and forehead  ? Syncope   ? T wave over sensing resulting in inappropriate shocks   ? a. 03/2013.  ? Tobacco abuse   ? Ventricular tachycardia ([place] 04/01/2016  ? ? ?Assessment: ?88yo male s/p mechanical fall with CT evidence of Comminuted and displaced right intertrochanteric fracture with varus angulation. Patient s/p fixation by ortho and to be placed on VTE prophylaxis with lovenox for 4 weeks. Crcl ~459mmin and BMI <30. Would start lovenox '40mg'$  SQ q24h (8 hours after last dose of sq heparin. Would recommend TOC consult  to check for affordability of lovenox as outpatient.  ?Goal of Therapy:  ?Prevention of VTE ? ?Monitor platelets by anticoagulation protocol: Yes ?  ?Plan:  ?Lovenox '40mg'$  SQ q24h ?D/c sq heparin order ?Monitor PLTC/renal function while inpatient ?Will try to obtain copay information for outpatient therapy.  ? ?Javaya [place] A. Levada Dy, PharmD, BCPS, FNKF ?Clinical Pharmacist ?[place] ?Please utilize Amion for appropriate phone number to reach the unit pharmacist ([place]) ? ?Addendum:  ?Copay check reveals 4 week copay of $63.28.  ? ?02/15/2022,11:04 AM ? ? ?

## 2022-02-15 NOTE — Progress Notes (Signed)
?                                  PROGRESS NOTE                                             ?                                                                                                                     ?                                         ? ? Patient Demographics:  ? ? [name], is a 88 y.o. male, DOB - 05-22-1931, PJK:[id number] ? ?Outpatient Primary MD for the patient is Caren Macadam, MD    LOS - 3  Admit date - 02/12/2022   ? ?Chief Complaint  ?Patient presents with  ? Fall  ?    ? ?Brief Narrative (HPI from H&P)  - 88 y.o. male, with H/O tobacco dependence - quit; AICD placement for NICM; PVD; HTN; HLD; COPD; chronic combined CHF; CAD s/p stenting; AAA s/p repair (2002); carotid disease, LGIB, age-related cognitive decline, recent urinary retention Foley catheter was placed yesterday in the ER, recent dental procedure 1 week ago after which he has been increasingly getting more confused at home for the last 1 week.  Mechanical fall at home fractured his right hip and was admitted to the hospital for right hip fracture. ? ? Subjective:  ? ?Patient in bed, appears comfortable, denies any headache, no fever, no chest pain or pressure, no shortness of breath , no abdominal pain. No new focal weakness. ? ? ? Assessment  & Plan :  ? ?1.  Mechanical fall with CT evidence of Comminuted and displaced right intertrochanteric fracture with varus angulation. Additional, probable subcapital right femoral neck fracture which is nondisplaced.  He underwent surgical fixation of his fracture by Dr. Ginette Pitman on 02/13/2022, has tolerated the procedure reasonably well with minimal perioperative blood loss related anemia, no transfusion needed we will continue to monitor.  Weightbearing as tolerated, initiate PT OT will require SNF.  4 weeks of anticoagulation per Dr. Marcelino Scot.  Will be placed on Lovenox for a total of 4 weeks for DVT prophylaxis.  Case discussed with Dr. Marcelino Scot on  02/14/2022.  Medically stable will try and discharge him if no further acute issues on 02/16/2022 ? ? ? 2.  Chronic combined systolic and diastolic CHF last EF 80%.  He follows with Dr. Caryl Comes, currently compensated, blood pressure too low and Coreg has been discontinued in the outpatient setting, continue amiodarone, CKD and low blood pressure preclude addition of ACE/ARB, as needed IV hydralazine on  board. ?  ? ?3.  CAD with chronic ischemic cardiomyopathy EF 35% history of V. tach.  Continue aspirin and statin, no acute issues.  Chest pain-free.  EKG has chronic changes.  He has AICD, on amiodarone which will be continued, follows with Dr. Caryl Comes. ?  ?4. HX of chronic hypotension.  Continue midodrine. ?  ?5. Hypothyroidism.  On Synthroid continue. ?  ?6.  History of COPD.  Stable no acute issues as needed oxygen, keep pulse ox over 92% due to underlying history of CAD and CHF, good pulmonary toiletry, as needed nebulizer treatments. ?  ?7.  History of age-related cognitive decline.  Increasing delirium and metabolic encephalopathy at home lately.  Expect to get worse, as needed Haldol, minimize narcotics, strictly avoid benzodiazepines, safety restraints if needed.  High risk for fall and worsening encephalopathy/delirium.  Family member [place] and bedside caregiver made aware.  Will involve PT, OT and speech postsurgery. ?  ?8.  Recent urinary retention and Foley catheter placement in the ER few days ago.  Continue Foley, flush every shift and monitor for signs of any obstruction, continue prostate medications, outpatient urology follow-up postdischarge ?  ?9.  Dyslipidemia.  Continue home dose statin. ?  ?10. 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 AKI improved.  Close to her baseline. ? ?   ? ?Condition  - Extremely Guarded ? ?Family Communication  :  Daughter [name] [phone] on 02/12/22, 02/13/22, 02/14/22 ? ?Code Status :  DNR ? ?Consults  :  Ortho ? ?PUD Prophylaxis : PPI ? ? Procedures  :    ? ?CT head and C-spine.  Nonacute. ? ?CT of the right hip - Comminuted and displaced right intertrochanteric fracture with varus angulation. Additional, probable subcapital right femoral neck fracture which is nondisplaced. ? ?   ? ?Disposition Plan  :   ? ?Status is: Inpatient ? ?DVT Prophylaxis  :   ? ?heparin injection 5,000 Units Start: 02/14/22 1400 ?SCDs Start: 02/13/22 1709 ?SCDs Start: 02/12/22 1739 ?Place and maintain sequential compression device Start: 02/12/22 1707 ?Place TED hose Start: 02/12/22 1707 ? ? ?Lab Results  ?Component Value Date  ? PLT 105 (L) 02/15/2022  ? ? ?Diet :  ?Diet Order   ? ?       ?  DIET - DYS 1 Room service appropriate? Yes; Fluid consistency: Thin  Diet effective now       ?  ? ?  ?  ? ?  ?  ? ?Inpatient Medications ? ?Scheduled Meds: ? acetaminophen  500 mg Oral Q8H  ? allopurinol  100 mg Oral Daily  ? amiodarone  200 mg Oral Daily  ? aspirin EC  81 mg Oral Daily  ? budesonide  0.5 mg Nebulization BID  ? Chlorhexidine Gluconate Cloth  6 each Topical Daily  ? cholecalciferol  2,000 Units Oral Daily  ? docusate sodium  100 mg Oral BID  ? feeding supplement  237 mL Oral TID BM  ? heparin injection (subcutaneous)  5,000 Units Subcutaneous Q8H  ? levothyroxine  125 mcg Oral Daily  ? magnesium oxide  200 mg Oral Daily  ? midodrine  5 mg Oral TID WC  ? multivitamin with minerals  1 tablet Oral Daily  ? oxybutynin  5 mg Oral BID  ? pravastatin  40 mg Oral  Daily  ? tamsulosin  0.4 mg Oral Daily  ? thiamine  100 mg Oral Daily  ? ?Continuous Infusions: ? lactated ringers Stopped (02/14/22 1357)  ? ?PRN Meds:.acetaminophen, albuterol, haloperidol lactate, hydrALAZINE, HYDROcodone-acetaminophen, metoprolol tartrate, morphine injection, [DISCONTINUED] ondansetron **OR** ondansetron (ZOFRAN) IV,  senna-docusate ? ?Antibiotics  :   ? ?Anti-infectives (From admission, onward)  ? ? Start     Dose/Rate Route Frequency Ordered Stop  ? 02/14/22 0600  ceFAZolin (ANCEF) IVPB 2g/100 mL premix       ? 2 g ?200 mL/hr over 30 Minutes Intravenous On call to O.R. 02/13/22 1241 02/13/22 1452  ? 02/13/22 1800  ceFAZolin (ANCEF) IVPB 2g/100 mL premix       ? 2 g ?200 mL/hr over 30 Minutes Intravenous Every 6 hours 02/13/22 1709 02/14/22 0106  ? ?  ? ? ? Time Spent in minutes  30 ? ? ?Lala Lund M.D on 02/15/2022 at 10:46 AM ? ?To page go to [email or web address]  ? ?[facility] -  Office  [phone] ? ?See all Orders from today for further details ? ? ? Objective:  ? ?Vitals:  ? 02/14/22 2028 02/14/22 2030 02/15/22 0751 02/15/22 0819  ?BP: 107/61  (!) 128/59   ?Pulse: 61  (!) 59 61  ?Resp: '16  17 16  '$ ?Temp: 97.6 ?F (36.4 ?C)  97.8 ?F (36.6 ?C)   ?TempSrc:   Oral   ?SpO2: 100% 100% 100% 100%  ?Weight:      ?Height:      ? ? ?Wt Readings from Last 3 Encounters:  ?02/12/22 90.7 kg  ?01/17/22 77.6 kg  ?12/29/21 78 kg  ? ? ? ?Intake/Output Summary (Last 24 hours) at 02/15/2022 1046 ?Last data filed at 02/15/2022 0441 ?Gross per 24 hour  ?Intake 1471.35 ml  ?Output --  ?Net 1471.35 ml  ? ? ? ?Physical Exam ? ?Awake Alert x1, No new F.N deficits, Normal affect ?[place].AT,PERRAL ?Supple Neck, No JVD,   ?Symmetrical Chest wall movement, Good air movement bilaterally, CTAB ?RRR,No Gallops, Rubs or new Murmurs,  ?+ve B.Sounds, Abd Soft, No tenderness,   ?Right hip postop site area stable with bandage over the incision, foley ? ?  ? ?RN pressure injury documentation: ?Pressure Ulcer 03/29/16 Stage I -  Intact skin with non-blanchable redness of a localized area usually over a bony prominence. (Active)  ?03/29/16 1145  ?Location: Sacrum  ?Location Orientation: Medial  ?Staging: Stage I -  Intact skin with non-blanchable redness of a localized area usually over a bony prominence.  ?Wound Description (Comments):   ?Present on Admission: Yes   ?   ?Pressure Injury 09/30/17 Deep Tissue Injury - Purple or maroon localized area of discolored intact skin or blood-filled blister due to damage of underlying soft tissue from pressure and/or shear. (Active)

## 2022-02-15 NOTE — Progress Notes (Signed)
Speech Language Pathology Treatment: Dysphagia  ?Patient Details ?Name: Ekin Pilar ?MRN: [id number] ?DOB: August 07, 1931 ?Today's Date: 02/15/2022 ?Time: 1030-1100 ?SLP Time Calculation (min) (ACUTE ONLY): 30 min ? ?Assessment / Plan / Recommendation ?Clinical Impression ? SLP repositioned pt for am meal. Pt continues to demonstrates prolonged, unnecessary mastication with textures that are not fully pureed (grits with cheese). Liquid wash helped but residue needed to be removed by SLP. Pt then consumed creamy purees with very good success and adequate intake. Pt initiating sipping from cup and using a liquid wash. Recommend pt continue current diet; hopeful for texture upgrade and self feeding at next level of care as mentation improves.   ?HPI HPI: 88 y.o. male, with H/O tobacco dependence - quit; AICD placement for NICM; PVD; HTN; HLD; COPD; chronic combined CHF; CAD s/p stenting; AAA s/p repair (2002); carotid disease, LGIB, age-related cognitive decline, recent urinary retention Foley catheter was placed yesterday in the ER, recent dental procedure 1 week ago after which he has been increasingly getting more confused at home for the last 1 week.  Mechanical fall at home fractured his right hip and was admitted to the hospital for right hip fracture. Underwent surgery on 4/25. Pt does have ahistory os severe dysphagia  with MBS 10/02/17 showing gross residuals, mild aspiration of thin liquids that couldn't be cleared (weak cough in response), and poor awareness/insight into deficits. He was started on full liquid diet, thickened to nectar thick, using a chin tuck and straw; tsps of water also okay. He was advanced to Dys 3 diet based on [place] decisions. In his following visits pt stated he wished to continue eating and drinking thin liquids and regualr soldis with known risk. ?  ?   ?SLP Plan ? Continue with current plan of care ? ?  ?  ?Recommendations for follow up therapy are one component of a  multi-disciplinary discharge planning process, led by the attending physician.  Recommendations may be updated based on patient status, additional functional criteria and insurance authorization. ?  ? ?Recommendations  ?Diet recommendations: Thin liquid;Dysphagia 1 (puree) ?Liquids provided via: Cup;Straw ?Medication Administration: Crushed with puree ?Supervision: Staff to assist with self feeding;Full supervision/cueing for compensatory strategies ?Compensations: Slow rate;Small sips/bites;Follow solids with liquid ?Postural Changes and/or Swallow Maneuvers: Seated upright 90 degrees;Upright 30-60 min after meal  ?   ?    ?   ? ? ? ? Oral Care Recommendations: Oral care BID ?Follow Up Recommendations: Skilled nursing-short term rehab (<3 hours/day) ?Plan: Continue with current plan of care ? ? ? ? ?  ?  ? ? ?Dani Wallner, Katherene Ponto ? ?02/15/2022, 12:24 PM ?

## 2022-02-15 NOTE — TOC Progression Note (Addendum)
Transition of Care (TOC) - Progression Note  ? ? ?Patient Details  ?Name: Yash William Sherrard ?MRN: [id number] ?Date of Birth: 04/01/1931 ? ?Transition of Care (TOC) CM/SW Contact  ?,  Jon, LCSW ?Phone Number: ?02/15/2022, 10:20 AM ? ?Clinical Narrative:  CSW spoke with pt daughter Cindy, provided bed offers.  They are only interested in 4 star or above facilities, asking for response from [facility].  CSW reached out to [facility] and also to [facility] for responses.    ? ?1430: CSW spoke with daughter by phone, she is no longer interested in [facility], requesting CSW send referral to [facility]. ? ?1540: [facility] unable to offer bed,  [facility] wants to see how pt does overnight before making decision. ?1550: CSW met with daughter in pt room, discussed current offers.  She is going to visit [facility] and [facility].  She would be interested in [facility].  Will make decision by tomorrow AM. ? ? ?Expected Discharge Plan: Skilled Nursing Facility ?Barriers to Discharge: Continued Medical Work up, SNF Pending bed offer ? ?Expected Discharge Plan and Services ?Expected Discharge Plan: Skilled Nursing Facility ?In-house Referral: Clinical Social Work ?  ?Post Acute Care Choice: Skilled Nursing Facility ?Living arrangements for the past 2 months: Single Family Home ?                ?  ?  ?  ?  ?  ?  ?  ?  ?  ?  ? ? ?Social Determinants of Health (SDOH) Interventions ?  ? ?Readmission Risk Interventions ?   ? View : No data to display.  ?  ?  ?  ? ? ?

## 2022-02-16 ENCOUNTER — Ambulatory Visit: Payer: Medicare Other | Admitting: Family Medicine

## 2022-02-16 DIAGNOSIS — S069XAD Unspecified intracranial injury with loss of consciousness status unknown, subsequent encounter: Secondary | ICD-10-CM | POA: Diagnosis not present

## 2022-02-16 DIAGNOSIS — I509 Heart failure, unspecified: Secondary | ICD-10-CM | POA: Diagnosis not present

## 2022-02-16 DIAGNOSIS — M25551 Pain in right hip: Secondary | ICD-10-CM | POA: Diagnosis not present

## 2022-02-16 DIAGNOSIS — I2510 Atherosclerotic heart disease of native coronary artery without angina pectoris: Secondary | ICD-10-CM | POA: Diagnosis not present

## 2022-02-16 DIAGNOSIS — W19XXXD Unspecified fall, subsequent encounter: Secondary | ICD-10-CM | POA: Diagnosis not present

## 2022-02-16 DIAGNOSIS — R278 Other lack of coordination: Secondary | ICD-10-CM | POA: Diagnosis not present

## 2022-02-16 DIAGNOSIS — S72001A Fracture of unspecified part of neck of right femur, initial encounter for closed fracture: Secondary | ICD-10-CM | POA: Diagnosis not present

## 2022-02-16 DIAGNOSIS — R4181 Age-related cognitive decline: Secondary | ICD-10-CM | POA: Diagnosis not present

## 2022-02-16 DIAGNOSIS — M6281 Muscle weakness (generalized): Secondary | ICD-10-CM | POA: Diagnosis not present

## 2022-02-16 DIAGNOSIS — R338 Other retention of urine: Secondary | ICD-10-CM | POA: Diagnosis not present

## 2022-02-16 DIAGNOSIS — Z743 Need for continuous supervision: Secondary | ICD-10-CM | POA: Diagnosis not present

## 2022-02-16 DIAGNOSIS — N183 Chronic kidney disease, stage 3 unspecified: Secondary | ICD-10-CM | POA: Diagnosis not present

## 2022-02-16 DIAGNOSIS — I10 Essential (primary) hypertension: Secondary | ICD-10-CM | POA: Diagnosis not present

## 2022-02-16 DIAGNOSIS — E43 Unspecified severe protein-calorie malnutrition: Secondary | ICD-10-CM | POA: Diagnosis not present

## 2022-02-16 DIAGNOSIS — F32A Depression, unspecified: Secondary | ICD-10-CM | POA: Diagnosis not present

## 2022-02-16 DIAGNOSIS — J449 Chronic obstructive pulmonary disease, unspecified: Secondary | ICD-10-CM | POA: Diagnosis not present

## 2022-02-16 DIAGNOSIS — I959 Hypotension, unspecified: Secondary | ICD-10-CM | POA: Diagnosis not present

## 2022-02-16 DIAGNOSIS — E785 Hyperlipidemia, unspecified: Secondary | ICD-10-CM | POA: Diagnosis not present

## 2022-02-16 DIAGNOSIS — I739 Peripheral vascular disease, unspecified: Secondary | ICD-10-CM | POA: Diagnosis not present

## 2022-02-16 DIAGNOSIS — Z9861 Coronary angioplasty status: Secondary | ICD-10-CM | POA: Diagnosis not present

## 2022-02-16 DIAGNOSIS — Z87898 Personal history of other specified conditions: Secondary | ICD-10-CM | POA: Diagnosis not present

## 2022-02-16 DIAGNOSIS — I5042 Chronic combined systolic (congestive) and diastolic (congestive) heart failure: Secondary | ICD-10-CM | POA: Diagnosis not present

## 2022-02-16 DIAGNOSIS — G9341 Metabolic encephalopathy: Secondary | ICD-10-CM | POA: Diagnosis not present

## 2022-02-16 DIAGNOSIS — S72141D Displaced intertrochanteric fracture of right femur, subsequent encounter for closed fracture with routine healing: Secondary | ICD-10-CM | POA: Diagnosis not present

## 2022-02-16 DIAGNOSIS — J9611 Chronic respiratory failure with hypoxia: Secondary | ICD-10-CM | POA: Diagnosis not present

## 2022-02-16 DIAGNOSIS — L89100 Pressure ulcer of unspecified part of back, unstageable: Secondary | ICD-10-CM | POA: Diagnosis not present

## 2022-02-16 DIAGNOSIS — F330 Major depressive disorder, recurrent, mild: Secondary | ICD-10-CM | POA: Diagnosis not present

## 2022-02-16 DIAGNOSIS — F17200 Nicotine dependence, unspecified, uncomplicated: Secondary | ICD-10-CM | POA: Diagnosis not present

## 2022-02-16 DIAGNOSIS — R1312 Dysphagia, oropharyngeal phase: Secondary | ICD-10-CM | POA: Diagnosis not present

## 2022-02-16 DIAGNOSIS — Z4889 Encounter for other specified surgical aftercare: Secondary | ICD-10-CM | POA: Diagnosis not present

## 2022-02-16 DIAGNOSIS — E039 Hypothyroidism, unspecified: Secondary | ICD-10-CM | POA: Diagnosis not present

## 2022-02-16 DIAGNOSIS — L89103 Pressure ulcer of unspecified part of back, stage 3: Secondary | ICD-10-CM | POA: Diagnosis not present

## 2022-02-16 DIAGNOSIS — R279 Unspecified lack of coordination: Secondary | ICD-10-CM | POA: Diagnosis not present

## 2022-02-16 DIAGNOSIS — Z95810 Presence of automatic (implantable) cardiac defibrillator: Secondary | ICD-10-CM | POA: Diagnosis not present

## 2022-02-16 DIAGNOSIS — R5381 Other malaise: Secondary | ICD-10-CM | POA: Diagnosis not present

## 2022-02-16 DIAGNOSIS — N400 Enlarged prostate without lower urinary tract symptoms: Secondary | ICD-10-CM | POA: Diagnosis not present

## 2022-02-16 DIAGNOSIS — I5040 Unspecified combined systolic (congestive) and diastolic (congestive) heart failure: Secondary | ICD-10-CM | POA: Diagnosis not present

## 2022-02-16 DIAGNOSIS — I255 Ischemic cardiomyopathy: Secondary | ICD-10-CM | POA: Diagnosis not present

## 2022-02-16 DIAGNOSIS — R339 Retention of urine, unspecified: Secondary | ICD-10-CM | POA: Diagnosis not present

## 2022-02-16 DIAGNOSIS — S72001D Fracture of unspecified part of neck of right femur, subsequent encounter for closed fracture with routine healing: Secondary | ICD-10-CM | POA: Diagnosis not present

## 2022-02-16 DIAGNOSIS — Z4789 Encounter for other orthopedic aftercare: Secondary | ICD-10-CM | POA: Diagnosis not present

## 2022-02-16 DIAGNOSIS — W19XXXA Unspecified fall, initial encounter: Secondary | ICD-10-CM | POA: Diagnosis not present

## 2022-02-16 DIAGNOSIS — R2681 Unsteadiness on feet: Secondary | ICD-10-CM | POA: Diagnosis not present

## 2022-02-16 MED ORDER — HYDROCODONE-ACETAMINOPHEN 5-325 MG PO TABS: 1.0000 | ORAL_TABLET | Freq: Three times a day (TID) | ORAL | 0 refills | Status: AC | PRN

## 2022-02-16 MED ORDER — ENOXAPARIN SODIUM 40 MG/0.4ML IJ SOSY: 40.0000 mg | PREFILLED_SYRINGE | INTRAMUSCULAR | Status: AC

## 2022-02-16 NOTE — TOC Progression Note (Addendum)
Transition of Care (TOC) - Progression Note  ? ? ?Patient Details  ?Name: [name] ?MRN: [id number] ?Date of Birth: 1931-05-17 ? ?Transition of Care (TOC) CM/SW Contact  ?Joanne Chars, LCSW ?Phone Number: ?02/16/2022, 9:54 AM ? ?Clinical Narrative:  CSW waiting on determination from [facility].  CSW spoke with daugher [name], [facility] is first choice, otherwise will go with [facility].    ? ?1015: [facility] does offer bed, needs daughter to bring one medication: yepelri.  CSW spoke with daughter and she can provide that med.  Daughter wants to quickly visit [facility] and is on her way to do so.  ? ?1115: Daughter confirms choice of [facility]. ? ?Expected Discharge Plan: [place] ?Barriers to Discharge: Continued Medical Work up, SNF Pending bed offer ? ?Expected Discharge Plan and Services ?Expected Discharge Plan: [place] ?In-house Referral: Clinical Social Work ?  ?Post Acute Care Choice: [place] ?Living arrangements for the past 2 months: [place] ?Expected Discharge Date: 02/16/22               ?  ?  ?  ?  ?  ?  ?  ?  ?  ?  ? ? ?Social Determinants of Health (SDOH) Interventions ?  ? ?Readmission Risk Interventions ?   ? View : No data to display.  ?  ?  ?  ? ? ?

## 2022-02-16 NOTE — Progress Notes (Signed)
Physical Therapy Treatment ?Patient Details ?Name: [name] ?MRN: [id number] ?DOB: 1931/09/23 ?Today's Date: 02/16/2022 ? ? ?History of Present Illness Pt is an 88 y/o male who presents s/p fall out of bed, sustaining a R hip fracture. He is now s/p R IM nail on 02/13/2022. PMH significant for AAA s/p repair 2002, AICD, CAD, CHF, COPD, DJD, gout, HTN, osteoporosis, PVD. ? ?  ?PT Comments  ? ? Pt progressing towards physical therapy goals. Was able to perform transfers and ambulation with up to +2 mod assist and chair follow for added safety. Pt with occasional sudden decision to sit, despite increased cues and education to reach back for the chair prior to initiating stand>sit. Daughter present and supportive throughout session. Pt anticipates d/c to SNF today. Will to continue to follow until d/c.  ?   ?Recommendations for follow up therapy are one component of a multi-disciplinary discharge planning process, led by the attending physician.  Recommendations may be updated based on patient status, additional functional criteria and insurance authorization. ? ?Follow Up Recommendations ? Skilled nursing-short term rehab (<3 hours/day) ?  ?  ?Assistance Recommended at Discharge Frequent or constant Supervision/Assistance  ?Patient can return home with the following Two people to help with walking and/or transfers;Two people to help with bathing/dressing/bathroom;Assistance with cooking/housework;Assist for transportation;Help with stairs or ramp for entrance ?  ?Equipment Recommendations ? None recommended by PT (TBD next venue of care)  ?  ?Recommendations for Other Services   ? ? ?  ?Precautions / Restrictions Precautions ?Precautions: Fall ?Restrictions ?Weight Bearing Restrictions: Yes ?RLE Weight Bearing: Weight bearing as tolerated  ?  ? ?Mobility ? Bed Mobility ?Overal bed mobility: Needs Assistance ?Bed Mobility: Supine to Sit, Sit to Supine ?  ?  ?Supine to sit: Mod assist ?Sit to supine: Mod assist ?   ?General bed mobility comments: Inreased time and use of rails required. Assist for LE advancement to EOB and for trunk elevation to full sitting position. ?  ? ?Transfers ?Overall transfer level: Needs assistance ?Equipment used: Rolling walker (2 wheels) ?Transfers: Sit to/from Stand, Bed to chair/wheelchair/BSC ?Sit to Stand: Mod assist, From elevated surface ?Stand pivot transfers: Mod assist ?  ?  ?  ?  ?General transfer comment: Cues for sequencing and assist for power up to full stand as well as for pivotal steps around from chair>bed. ?  ? ?Ambulation/Gait ?Ambulation/Gait assistance: Mod assist, +2 physical assistance, +2 safety/equipment ?Gait Distance (Feet): 8 Feet (x2) ?Assistive device: Rolling walker (2 wheels) ?Gait Pattern/deviations: Knee flexed in stance - left, Knees buckling, Trunk flexed, Narrow base of support, Step-to pattern, Step-through pattern ?Gait velocity: decreased ?Gait velocity interpretation: <1.31 ft/sec, indicative of household ambulator ?  ?General Gait Details: Step-by-step VC's for sequencing and safety with RW. +2 assist for management of RW and chair follow. ? ? ?Stairs ?  ?  ?  ?  ?  ? ? ?Wheelchair Mobility ?  ? ?Modified Rankin (Stroke Patients Only) ?  ? ? ?  ?Balance Overall balance assessment: Needs assistance ?Sitting-balance support: Feet supported, No upper extremity supported ?Sitting balance-Leahy Scale: Fair ?Sitting balance - Comments: initially with a posterior lean, able to correct with multimodal cues and requiring only min guard at EOB ?  ?Standing balance support: Bilateral upper extremity supported, During functional activity, Reliant on assistive device for balance ?Standing balance-Leahy Scale: Poor ?Standing balance comment: mod-max assist x2 required throughout standing activity. ?  ?  ?  ?  ?  ?  ?  ?  ?  ?  ?  ?  ? ?  ?  Cognition Arousal/Alertness: Awake/alert ?Behavior During Therapy: [facility] for tasks assessed/performed ?Overall Cognitive Status:  History of cognitive impairments - at baseline ?Area of Impairment: Following commands, Safety/judgement, Awareness, Problem solving, Orientation ?  ?  ?  ?  ?  ?  ?  ?  ?Orientation Level: Disoriented to, Situation, Time ?  ?  ?Following Commands: Follows one step commands consistently, Follows one step commands with increased time, Follows multi-step commands inconsistently ?Safety/Judgement: Decreased awareness of safety ?Awareness: Intellectual ?Problem Solving: Slow processing, Decreased initiation, Difficulty sequencing, Requires verbal cues, Requires tactile cues ?  ?  ?  ? ?  ?Exercises   ? ?  ?General Comments   ?  ?  ? ?Pertinent Vitals/Pain Pain Assessment ?Pain Assessment: Faces ?Faces Pain Scale: Hurts a little bit ?Breathing: normal ?Negative Vocalization: none ?Facial Expression: smiling or inexpressive ?Body Language: relaxed ?Consolability: no need to console ?PAINAD Score: 0 ?Pain Location: R hip ?Pain Descriptors / Indicators: Operative site guarding, Grimacing, Sore ?Pain Intervention(s): Limited activity within patient's tolerance, Monitored during session, Repositioned  ? ? ?Home Living   ?  ?  ?  ?  ?  ?  ?  ?  ?  ?   ?  ?Prior Function    ?  ?  ?   ? ?PT Goals (current goals can now be found in the care plan section) Acute Rehab PT Goals ?Patient Stated Goal: Feel better ?PT Goal Formulation: With patient/family ?Time For Goal Achievement: 02/28/22 ?Potential to Achieve Goals: Good ?Progress towards PT goals: Progressing toward goals ? ?  ?Frequency ? ? ? Min 3X/week ? ? ? ?  ?PT Plan Current plan remains appropriate  ? ? ?Co-evaluation   ?  ?  ?  ?  ? ?  ?AM-PAC PT "6 Clicks" Mobility   ?Outcome Measure ? Help needed turning from your back to your side while in a flat bed without using bedrails?: A Lot ?Help needed moving from lying on your back to sitting on the side of a flat bed without using bedrails?: A Lot ?Help needed moving to and from a bed to a chair (including a wheelchair)?: A  Lot ?Help needed standing up from a chair using your arms (e.g., wheelchair or bedside chair)?: A Lot ?Help needed to walk in hospital room?: Total ?Help needed climbing 3-5 steps with a railing? : Total ?6 Click Score: 10 ? ?  ?End of Session Equipment Utilized During Treatment: Gait belt ?Activity Tolerance: Patient tolerated treatment well ?Patient left: in bed;with call bell/phone within reach;with bed alarm set;with nursing/sitter in room;with family/visitor present ?Nurse Communication: Mobility status ?PT Visit Diagnosis: Unsteadiness on feet (R26.81);Pain;History of falling (Z91.81) ?Pain - Right/Left: Right ?Pain - part of body: Hip ?  ? ? ?Time: 3875-6433 ?PT Time Calculation (min) (ACUTE ONLY): 24 min ? ?Charges:  $Gait Training: 23-37 mins          ?          ? ?Rolinda Roan, PT, DPT ?Acute Rehabilitation Services ?Secure Chat Preferred ?Office: [phone]  ? ? ?[name] ?02/16/2022, 2:31 PM ? ?

## 2022-02-16 NOTE — Progress Notes (Signed)
Occupational Therapy Treatment ?Patient Details ?Name: [name] ?MRN: [id number] ?DOB: 1931/02/16 ?Today's Date: 02/16/2022 ? ? ?History of present illness Pt is an 88 y/o male who presents s/p fall out of bed, sustaining a R hip fracture. He is now s/p R IM nail on 02/13/2022. PMH significant for AAA s/p repair 2002, AICD, CAD, CHF, COPD, DJD, gout, HTN, osteoporosis, PVD. ?  ?OT comments ? Pt assisted to EOB with min assist, stood from elevated bed with stedy and transferred for BM to [facility]. Completed oral care with set up, UB bathing and dressing with mod assist and pericare in standing with total assist. Pt returned to supine with moderate assistance. Tolerated all activity well. Pt's caregiver in room.   ? ?Recommendations for follow up therapy are one component of a multi-disciplinary discharge planning process, led by the attending physician.  Recommendations may be updated based on patient status, additional functional criteria and insurance authorization. ?   ?Follow Up Recommendations ? Skilled nursing-short term rehab (<3 hours/day)  ?  ?Assistance Recommended at Discharge Frequent or constant Supervision/Assistance  ?Patient can return home with the following ? Two people to help with walking and/or transfers;Two people to help with bathing/dressing/bathroom;Assistance with cooking/housework;Assistance with feeding;Direct supervision/assist for medications management;Direct supervision/assist for financial management;Assist for transportation;Help with stairs or ramp for entrance ?  ?Equipment Recommendations ? None recommended by OT  ?  ?Recommendations for Other Services   ? ?  ?Precautions / Restrictions Precautions ?Precautions: Fall ?Restrictions ?Weight Bearing Restrictions: Yes ?RLE Weight Bearing: Weight bearing as tolerated  ? ? ?  ? ?Mobility Bed Mobility ?Overal bed mobility: Needs Assistance ?Bed Mobility: Supine to Sit, Sit to Supine ?  ?  ?Supine to sit: Min assist ?Sit to supine: Mod  assist ?  ?  ?  ? ?Transfers ?Overall transfer level: Needs assistance ?Equipment used: Ambulation equipment used ?Transfers: Sit to/from Stand ?Sit to Stand: Min assist, From elevated surface ?Stand pivot transfers: Total assist, +2 safety/equipment ?  ?  ?  ?  ?  ?Transfer via Lift Equipment: Stedy ?  ?Balance Overall balance assessment: Needs assistance ?Sitting-balance support: Feet supported, No upper extremity supported ?Sitting balance-Leahy Scale: Fair ?  ?  ?Standing balance support: Bilateral upper extremity supported, During functional activity, Reliant on assistive device for balance ?Standing balance-Leahy Scale: Poor ?  ?  ?  ?  ?  ?  ?  ?  ?  ?  ?  ?  ?   ? ?ADL either performed or assessed with clinical judgement  ? ?ADL Overall ADL's : Needs assistance/impaired ?  ?  ?Grooming: Oral care;Sitting;Set up ?  ?Upper Body Bathing: Moderate assistance;Sitting ?  ?  ?  ?Upper Body Dressing : Moderate assistance;Sitting ?  ?  ?  ?Toilet Transfer: Total assistance;BSC/3in1 ?Toilet Transfer Details (indicate cue type and reason): with stedy ?Toileting- Clothing Manipulation and Hygiene: Total assistance;Sit to/from stand ?  ?  ?  ?  ?  ?  ? ?Extremity/Trunk Assessment   ?  ?  ?  ?  ?  ? ?Vision   ?  ?  ?Perception   ?  ?Praxis   ?  ? ?Cognition Arousal/Alertness: Awake/alert ?Behavior During Therapy: [facility] for tasks assessed/performed ?Overall Cognitive Status: History of cognitive impairments - at baseline ?  ?  ?  ?  ?  ?  ?  ?  ?  ?  ?  ?  ?  ?  ?  ?  ?General Comments: alert  and following commands with increased time, decreased safety awareness ?  ?  ?   ?Exercises   ? ?  ?Shoulder Instructions   ? ? ?  ?General Comments    ? ? ?Pertinent Vitals/ Pain       Pain Assessment ?Pain Assessment: No/denies pain ? ?Home Living   ?  ?  ?  ?  ?  ?  ?  ?  ?  ?  ?  ?  ?  ?  ?  ?  ?  ?  ? ?  ?Prior Functioning/Environment    ?  ?  ?  ?   ? ?Frequency ? Min 2X/week  ? ? ? ? ?  ?Progress Toward Goals ? ?OT Goals(current  goals can now be found in the care plan section) ? Progress towards OT goals: Progressing toward goals ? ?Acute Rehab OT Goals ?OT Goal Formulation: With patient ?Time For Goal Achievement: 02/28/22 ?Potential to Achieve Goals: Good  ?Plan Discharge plan remains appropriate   ? ?Co-evaluation ? ? ?   ?  ?  ?  ?  ? ?  ?AM-PAC OT "6 Clicks" Daily Activity     ?Outcome Measure ? ? Help from another person eating meals?: A Little ?Help from another person taking care of personal grooming?: A Little ?Help from another person toileting, which includes using toliet, bedpan, or urinal?: Total ?Help from another person bathing (including washing, rinsing, drying)?: A Lot ?Help from another person to put on and taking off regular upper body clothing?: A Lot ?Help from another person to put on and taking off regular lower body clothing?: Total ?6 Click Score: 12 ? ?  ?End of Session   ? ?OT Visit Diagnosis: Unsteadiness on feet (R26.81);Other abnormalities of gait and mobility (R26.89);Muscle weakness (generalized) (M62.81);Pain ?  ?Activity Tolerance Patient tolerated treatment well ?  ?Patient Left in bed;with call bell/phone within reach;with bed alarm set;with family/visitor present ?  ?Nurse Communication   ?  ? ?   ? ?Time: 9373-4287 ?OT Time Calculation (min): 37 min ? ?Charges: OT General Charges ?$OT Visit: 1 Visit ?OT Treatments ?$Self Care/Home Management : 23-37 mins ? ?[name], OTR/L ?Acute Rehabilitation Services ?Pager: [phone] ?Office: [phone]  ? ?[name] ?02/16/2022, 11:15 AM ?

## 2022-02-16 NOTE — Progress Notes (Signed)
Remote ICD transmission.   

## 2022-02-16 NOTE — Progress Notes (Signed)
[name] to be D/C'd skilled nursing facility per MD order.  Discussed with the patient and all questions fully answered. ? ?IV catheter discontinued intact. Site without signs and symptoms of complications. Dressing and pressure applied. ? ?An After Visit Summary was printed and given to PTAR.  ? ?D/c education completed with patient/family including follow up instructions, medication list, d/c activities limitations if indicated, with other d/c instructions as indicated by MD - patient able to verbalize understanding, all questions fully answered.  ? ?Report called to receiving facility RN, Mia.  ? ?Patient instructed to return to ED, call 911, or call MD for any changes in condition.  ? ?Patient escorted via stretcher, and D/C to [facility] via non emergency ambulance. ? ?[name] ?02/16/2022 1:42 PM  ?

## 2022-02-16 NOTE — TOC Transition Note (Signed)
Transition of Care (TOC) - CM/SW Discharge Note ? ? ?Patient Details  ?Name: Shiloh Swopes ?MRN: [id number] ?Date of Birth: 25-Jan-1931 ? ?[facility] of [facility]) CM/SW Contact:  ?Joanne Chars, LCSW ?Phone Number: ?02/16/2022, 11:24 AM ? ? ?Clinical Narrative:   Pt discharging to [facility].  RN call [phone] for report.  ? ? ? ?Final next level of care: [place] ?Barriers to Discharge: Barriers Resolved ? ? ?Patient Goals and CMS Choice ?  ?CMS Medicare.gov Compare Post Acute Care list provided to:: Patient Represenative (must comment) ?Choice offered to / list presented to : Adult Children ? ?Discharge Placement ?  ?           ?Patient chooses bed at: [facility] [place] ?Patient to be transferred to facility by: PTAR ?Name of family member notified: daughter [name] ?Patient and family notified of of transfer: 02/16/22 ? ?Discharge Plan and Services ?In-house Referral: Clinical Social Work ?  ?Post Acute Care Choice: [place]          ?  ?  ?  ?  ?  ?  ?  ?  ?  ?  ? ?Social Determinants of Health (SDOH) Interventions ?  ? ? ?Readmission Risk Interventions ?   ? View : No data to display.  ?  ?  ?  ? ? ? ? ? ?

## 2022-02-16 NOTE — Discharge Summary (Signed)
?                                                                                ? ?[name] GGY:[id number] DOB: [birth year] DOA: 02/12/2022 ? ?PCP: Caren Macadam, MD ? ?Admit date: 02/12/2022  Discharge date: 02/16/2022 ? ?Admitted From: Home   Disposition:  SNF ? ? ?Recommendations for Outpatient Follow-up:  ? ?Follow up with PCP in 1-2 weeks ? ?PCP Please obtain BMP/CBC, 2 view CXR in 1week,  (see Discharge instructions)  ? ?PCP Please follow up on the following pending results:  ? ? ?Home Health: None   ?Equipment/Devices: None  ?Consultations: Orthopedics ?Discharge Condition: Stable    ?CODE STATUS: Full    ?Diet Recommendation: Dysphagia 1 diet with feeding assistance and aspiration precautions ?  ? ?Chief Complaint  ?Patient presents with  ? Fall  ?  ? ?Brief history of present illness from the day of admission and additional interim summary   ? ?88 y.o. male, with H/O tobacco dependence - quit; AICD placement for NICM; PVD; HTN; HLD; COPD; chronic combined CHF; CAD s/p stenting; AAA s/p repair (2002); carotid disease, LGIB, age-related cognitive decline, recent urinary retention Foley catheter was placed yesterday in the ER, recent dental procedure 1 week ago after which he has been increasingly getting more confused at home for the last 1 week.  Mechanical fall at home fractured his right hip and was admitted to the hospital for right hip fracture. ? ?                                                               Hospital Course  ? ?1.  Mechanical fall with CT evidence of Comminuted and displaced right intertrochanteric fracture with varus angulation. Additional, probable subcapital right femoral neck fracture which is nondisplaced.  He underwent surgical fixation of his fracture by Dr. Marcelino Scot on 02/13/2022, has tolerated the procedure reasonably well with minimal perioperative blood loss related anemia, no  transfusion needed we will continue to monitor.  Weightbearing as tolerated, initiate PT OT will require SNF.  4 weeks of anticoagulation per Dr. Marcelino Scot via subcu Lovenox.  Case discussed with Dr. Marcelino Scot on 02/14/2022.  Medically stable discharge to SNF today. ?  ?  ? 2.  Chronic combined systolic and diastolic CHF last EF 03%.  He follows with Dr. Caryl Comes, currently compensated, blood pressure too low and Coreg has been discontinued in the outpatient setting, continue amiodarone, CKD and low blood pressure preclude addition of ACE/ARB or Entresto.  Compensated.  Home dose diuretic continued upon discharge ?  ?  ?3.  CAD with chronic ischemic cardiomyopathy EF 35% history of V. tach.  Continue aspirin and statin, no acute issues.  Chest pain-free.  EKG has chronic changes.  He has AICD, on amiodarone which will be continued, follows with Dr. Caryl Comes. ?  ?4. HX of chronic hypotension.  Continue midodrine. ?  ?5. Hypothyroidism.  On Synthroid continue. ?  ?6.  History of COPD.  Stable no acute issues as needed oxygen, keep pulse ox over 92% due to underlying history of CAD and CHF, good pulmonary toiletry, as needed nebulizer treatments. ?  ?7.  History of age-related cognitive decline.  Increasing delirium and metabolic encephalopathy at home lately.  Expect to get worse, as needed Haldol as needed can be utilized at SNF, keep room as bright with sunlight as possible in daytime, minimize narcotics, strictly avoid benzodiazepines. ?  ?8.  Recent urinary retention and Foley catheter placement in the ER few days ago.  Continue Foley, flush every shift and monitor for signs of any obstruction, continue prostate medications, outpatient urology follow-up postdischarge ?  ?9.  Dyslipidemia.  Continue home dose statin. ?  ?10.  AKI on CKD 3B- creatinine around 1.5, recent bladder outlet obstruction, in with Foley and Flomax on which she will be discharged with outpatient urology follow-up.  AKI much improved. ? ? ?Discharge  diagnosis   ? ? ?Principal Problem: ?  Closed right hip fracture ([place]) ?Active Problems: ?  Essential hypertension ?  Peripheral vascular disease ([place]) ?  Automatic implantable cardioverter-defibrillator in situ ?  Cardiomyopathy, ischemic ?  TBI (traumatic brain injury) ([place]) ?  Urinary retention ?  CAD S/P percutaneous coronary angioplasty ?  Chronic combined systolic and diastolic CHF (congestive heart failure) ([place]) ?  Physical deconditioning ?  CKD (chronic kidney disease) stage 3, GFR 30-59 ml/min (HCC) ?  Chronic hypoxemic respiratory failure (HCC) ?  Dyslipidemia ?  Age-related cognitive decline ?  Protein-calorie malnutrition, severe ? ? ? ?Discharge instructions   ? ?Discharge Instructions   ? ? Diet - low sodium heart healthy   Complete by: As directed ?  ? Discharge instructions   Complete by: As directed ?  ? Follow with Primary MD Caren Macadam, MD in 7 days  ? ?Get CBC, CMP, 2 view Chest X ray -  checked next visit within 1 week by SNF MD   ? ?Activity: As tolerated with Full fall precautions use walker/cane & assistance as needed ? ?Disposition SNF ? ?Diet: Dysphagia 1 diet with feeding assistance and aspiration precautions. ? ?Special Instructions: If you have smoked or chewed Tobacco  in the last 2 yrs please stop smoking, stop any regular Alcohol  and or any Recreational drug use. ? ?On your next visit with your primary care physician please Get Medicines reviewed and adjusted. ? ?Please request your Prim.MD to go over all Hospital Tests and Procedure/Radiological results at the follow up, please get all Hospital records sent to your Prim MD by signing hospital release before you go home. ? ?If you experience worsening of your admission symptoms, develop shortness of breath, life threatening emergency, suicidal or homicidal thoughts you must seek medical attention immediately by calling 911 or calling your MD immediately  if symptoms less severe. ? ?You Must read complete  instructions/literature along with all the possible adverse reactions/side effects for all the Medicines you take and that have been prescribed to you. Take any new Medicines after you have completely understood and accpet all the possible adverse reactions/side effects.  ? Discharge wound care:   Complete by: As directed ?  ? Keep right hip postop surgical site clean and dry at all times, dry dressing.  Follow-up with orthopedics in 1 to 2 weeks post discharge  ? Increase activity slowly   Complete by: As directed ?  ? ?  ? ? ?Discharge Medications  ? ?Allergies as of 02/16/2022   ? ?  Reactions  ? Lisinopril   ? BP dropped too low per daughter  ? ?  ? ?  ?Medication List  ?  ? ?STOP taking these medications   ? ?amoxicillin 875 MG tablet ?Commonly known as: AMOXIL ?  ?oxyCODONE 5 MG immediate release tablet ?Commonly known as: Oxy IR/ROXICODONE ?  ?PRESERVISION AREDS 2 PO ?  ? ?  ? ?TAKE these medications   ? ?acetaminophen 500 MG tablet ?Commonly known as: TYLENOL ?Take 1 tablet (500 mg total) by mouth every 8 (eight) hours for 14 days. ?  ?allopurinol 100 MG tablet ?Commonly known as: ZYLOPRIM ?TAKE 1 TABLET ONCE DAILY. ?  ?amiodarone 200 MG tablet ?Commonly known as: PACERONE ?TAKE 1 TABLET ('200MG'$ ) BY MOUTH 5 DAYS PER WEEK. ?What changed: See the new instructions. ?  ?aspirin EC 81 MG tablet ?Take 81 mg by mouth daily. ?  ?budesonide 0.5 MG/2ML nebulizer solution ?Commonly known as: Pulmicort ?Take 2 mLs (0.5 mg total) by nebulization 2 (two) times daily. ?  ?docusate sodium 100 MG capsule ?Commonly known as: COLACE ?Take 1 capsule (100 mg total) by mouth 2 (two) times daily. ?  ?enoxaparin 40 MG/0.4ML injection ?Commonly known as: LOVENOX ?Inject 0.4 mLs (40 mg total) into the skin daily. ?  ?feeding supplement Liqd ?Take 237 mLs by mouth 3 (three) times daily between meals. ?  ?fluticasone 0.05 % cream ?Commonly known as: CUTIVATE ?Apply 1 application. topically 2 (two) times daily as needed (pain). ?   ?formoterol 20 MCG/2ML nebulizer solution ?Commonly known as: PERFOROMIST ?Take 2 mLs (20 mcg total) by nebulization 2 (two) times daily. ?  ?furosemide 20 MG tablet ?Commonly known as: LASIX ?TAKE 1 TABLET ONCE DAILY. ?  ?guaiFENes

## 2022-02-18 DIAGNOSIS — I5040 Unspecified combined systolic (congestive) and diastolic (congestive) heart failure: Secondary | ICD-10-CM | POA: Diagnosis not present

## 2022-02-18 DIAGNOSIS — S72001A Fracture of unspecified part of neck of right femur, initial encounter for closed fracture: Secondary | ICD-10-CM | POA: Diagnosis not present

## 2022-02-18 DIAGNOSIS — F17200 Nicotine dependence, unspecified, uncomplicated: Secondary | ICD-10-CM | POA: Diagnosis not present

## 2022-02-18 DIAGNOSIS — Z95810 Presence of automatic (implantable) cardiac defibrillator: Secondary | ICD-10-CM | POA: Diagnosis not present

## 2022-02-20 DIAGNOSIS — N400 Enlarged prostate without lower urinary tract symptoms: Secondary | ICD-10-CM | POA: Diagnosis not present

## 2022-02-20 DIAGNOSIS — I509 Heart failure, unspecified: Secondary | ICD-10-CM | POA: Diagnosis not present

## 2022-02-20 DIAGNOSIS — E785 Hyperlipidemia, unspecified: Secondary | ICD-10-CM | POA: Diagnosis not present

## 2022-02-20 DIAGNOSIS — F32A Depression, unspecified: Secondary | ICD-10-CM | POA: Diagnosis not present

## 2022-02-20 DIAGNOSIS — W19XXXD Unspecified fall, subsequent encounter: Secondary | ICD-10-CM | POA: Diagnosis not present

## 2022-02-20 DIAGNOSIS — E039 Hypothyroidism, unspecified: Secondary | ICD-10-CM | POA: Diagnosis not present

## 2022-02-20 DIAGNOSIS — J449 Chronic obstructive pulmonary disease, unspecified: Secondary | ICD-10-CM | POA: Diagnosis not present

## 2022-02-20 DIAGNOSIS — S72001D Fracture of unspecified part of neck of right femur, subsequent encounter for closed fracture with routine healing: Secondary | ICD-10-CM | POA: Diagnosis not present

## 2022-02-21 ENCOUNTER — Other Ambulatory Visit: Payer: Self-pay | Admitting: *Deleted

## 2022-02-21 DIAGNOSIS — G9341 Metabolic encephalopathy: Secondary | ICD-10-CM | POA: Diagnosis not present

## 2022-02-21 DIAGNOSIS — Z87898 Personal history of other specified conditions: Secondary | ICD-10-CM | POA: Diagnosis not present

## 2022-02-21 DIAGNOSIS — R4181 Age-related cognitive decline: Secondary | ICD-10-CM | POA: Diagnosis not present

## 2022-02-21 DIAGNOSIS — F330 Major depressive disorder, recurrent, mild: Secondary | ICD-10-CM | POA: Diagnosis not present

## 2022-02-21 NOTE — Patient Outreach (Signed)
Per [place] eligible member currently resides in [facility] SNF.  Screening for potential [facility] care coordination services as a benefit of [employer] plan. ? ?Member's PCP at [facility] has [place] care coordination services available if needed post SNF.  ? ?Secure communication sent to facility SW to make aware writer is following for transition plans and [facility] needs. ? ?Will continue to follow while member resides in SNF. ? ? ? ?Jesse Rolling, MSN, RN,BSN ?[place] Coordinator ?[phone] [facility]) ?[phone]  (Toll free office)   ?

## 2022-02-22 ENCOUNTER — Telehealth: Payer: Self-pay

## 2022-02-22 NOTE — Telephone Encounter (Signed)
LMOVM for patient to send missed ICM transmission. 

## 2022-02-27 ENCOUNTER — Other Ambulatory Visit: Payer: Self-pay | Admitting: *Deleted

## 2022-02-27 NOTE — Patient Outreach (Signed)
[facility] Post- Acute Care Coordinator follow up. Per [place] eligible member currently resides in [facility].  Screened for potential [facility] care coordination services as a benefit of [employer] plan. ? ?Member's PCP at [facility] has [place] care coordination team.  ? ?Update received from [place] SW indicating transition plans are pending progress. Member has been experiencing blood pressure issues that SNF MD has been addressing. Transition plan home with caregivers vs LTC. ? ?Will continue to follow while member resides in SNF. ? ?Marthenia Rolling, MSN, RN,BSN ?[place] Coordinator ?[phone] [facility]) ?[phone]  (Toll free office)   ?

## 2022-02-27 NOTE — Progress Notes (Signed)
No ICM remote transmission received for 02/19/2022 and next ICM transmission scheduled for 03/12/2022.   ?

## 2022-02-28 DIAGNOSIS — L89100 Pressure ulcer of unspecified part of back, unstageable: Secondary | ICD-10-CM | POA: Diagnosis not present

## 2022-02-28 DIAGNOSIS — S72141D Displaced intertrochanteric fracture of right femur, subsequent encounter for closed fracture with routine healing: Secondary | ICD-10-CM | POA: Diagnosis not present

## 2022-02-28 DIAGNOSIS — R338 Other retention of urine: Secondary | ICD-10-CM | POA: Diagnosis not present

## 2022-03-07 ENCOUNTER — Ambulatory Visit: Payer: Medicare Other | Admitting: Family Medicine

## 2022-03-07 DIAGNOSIS — R4181 Age-related cognitive decline: Secondary | ICD-10-CM | POA: Diagnosis not present

## 2022-03-07 DIAGNOSIS — Z87898 Personal history of other specified conditions: Secondary | ICD-10-CM | POA: Diagnosis not present

## 2022-03-07 DIAGNOSIS — L89100 Pressure ulcer of unspecified part of back, unstageable: Secondary | ICD-10-CM | POA: Diagnosis not present

## 2022-03-07 DIAGNOSIS — F330 Major depressive disorder, recurrent, mild: Secondary | ICD-10-CM | POA: Diagnosis not present

## 2022-03-08 NOTE — Telephone Encounter (Signed)
Attempted to call pt but unable to reach. Left message for him to return call.  Due to multiple attempts trying to reach pt without being able to do so, per protocol encounter will be closed. 

## 2022-03-13 ENCOUNTER — Telehealth: Payer: Self-pay | Admitting: Pulmonary Disease

## 2022-03-13 NOTE — Telephone Encounter (Signed)
Called and left voicemail for patient and his daughter to call office back in regards to wanting information on Yuperli.

## 2022-03-14 MED ORDER — YUPELRI 175 MCG/3ML IN SOLN: 175.0000 ug | Freq: Every day | RESPIRATORY_TRACT | 0 refills | Status: DC

## 2022-03-14 NOTE — Telephone Encounter (Signed)
Spoke with the pt's daughter  She states pt fell and fractured his hip and is at a rehab facility  They do not have any yupelri and not pharmacy not allowed to ship there  I gave samples his daughter will pick up  I scheduled pt next available for July  Dr Valeta Harms- when he runs out of the samples do you want to prescribe any alternative?  We did not have enough to supply him with until end of July  Please advise thanks!

## 2022-03-15 NOTE — Telephone Encounter (Signed)
Left message for patient's daughter to call back. 

## 2022-03-15 NOTE — Progress Notes (Signed)
No ICM remote transmission received for 03/12/2022 and next ICM transmission scheduled for 04/02/2022.

## 2022-03-22 DIAGNOSIS — R338 Other retention of urine: Secondary | ICD-10-CM | POA: Diagnosis not present

## 2022-03-26 DIAGNOSIS — M545 Low back pain, unspecified: Secondary | ICD-10-CM | POA: Diagnosis not present

## 2022-03-26 DIAGNOSIS — J449 Chronic obstructive pulmonary disease, unspecified: Secondary | ICD-10-CM | POA: Diagnosis not present

## 2022-03-26 DIAGNOSIS — S72141D Displaced intertrochanteric fracture of right femur, subsequent encounter for closed fracture with routine healing: Secondary | ICD-10-CM | POA: Diagnosis not present

## 2022-03-26 DIAGNOSIS — Z4789 Encounter for other orthopedic aftercare: Secondary | ICD-10-CM | POA: Diagnosis not present

## 2022-03-26 DIAGNOSIS — R2681 Unsteadiness on feet: Secondary | ICD-10-CM | POA: Diagnosis not present

## 2022-03-26 DIAGNOSIS — R293 Abnormal posture: Secondary | ICD-10-CM | POA: Diagnosis not present

## 2022-03-26 DIAGNOSIS — R278 Other lack of coordination: Secondary | ICD-10-CM | POA: Diagnosis not present

## 2022-03-26 DIAGNOSIS — M25551 Pain in right hip: Secondary | ICD-10-CM | POA: Diagnosis not present

## 2022-03-26 DIAGNOSIS — I10 Essential (primary) hypertension: Secondary | ICD-10-CM | POA: Diagnosis not present

## 2022-03-26 DIAGNOSIS — M6281 Muscle weakness (generalized): Secondary | ICD-10-CM | POA: Diagnosis not present

## 2022-03-26 DIAGNOSIS — I5042 Chronic combined systolic (congestive) and diastolic (congestive) heart failure: Secondary | ICD-10-CM | POA: Diagnosis not present

## 2022-03-26 DIAGNOSIS — Z9181 History of falling: Secondary | ICD-10-CM | POA: Diagnosis not present

## 2022-03-27 DIAGNOSIS — S72141D Displaced intertrochanteric fracture of right femur, subsequent encounter for closed fracture with routine healing: Secondary | ICD-10-CM | POA: Diagnosis not present

## 2022-03-27 DIAGNOSIS — M545 Low back pain, unspecified: Secondary | ICD-10-CM | POA: Diagnosis not present

## 2022-03-27 DIAGNOSIS — J449 Chronic obstructive pulmonary disease, unspecified: Secondary | ICD-10-CM | POA: Diagnosis not present

## 2022-03-27 DIAGNOSIS — R2681 Unsteadiness on feet: Secondary | ICD-10-CM | POA: Diagnosis not present

## 2022-03-27 DIAGNOSIS — M6281 Muscle weakness (generalized): Secondary | ICD-10-CM | POA: Diagnosis not present

## 2022-03-27 DIAGNOSIS — R278 Other lack of coordination: Secondary | ICD-10-CM | POA: Diagnosis not present

## 2022-03-28 DIAGNOSIS — L89103 Pressure ulcer of unspecified part of back, stage 3: Secondary | ICD-10-CM | POA: Diagnosis not present

## 2022-03-28 DIAGNOSIS — J449 Chronic obstructive pulmonary disease, unspecified: Secondary | ICD-10-CM | POA: Diagnosis not present

## 2022-03-28 DIAGNOSIS — M545 Low back pain, unspecified: Secondary | ICD-10-CM | POA: Diagnosis not present

## 2022-03-28 DIAGNOSIS — R2681 Unsteadiness on feet: Secondary | ICD-10-CM | POA: Diagnosis not present

## 2022-03-28 DIAGNOSIS — S72141D Displaced intertrochanteric fracture of right femur, subsequent encounter for closed fracture with routine healing: Secondary | ICD-10-CM | POA: Diagnosis not present

## 2022-03-28 DIAGNOSIS — R278 Other lack of coordination: Secondary | ICD-10-CM | POA: Diagnosis not present

## 2022-03-28 DIAGNOSIS — M6281 Muscle weakness (generalized): Secondary | ICD-10-CM | POA: Diagnosis not present

## 2022-03-29 DIAGNOSIS — J449 Chronic obstructive pulmonary disease, unspecified: Secondary | ICD-10-CM | POA: Diagnosis not present

## 2022-03-29 DIAGNOSIS — R2681 Unsteadiness on feet: Secondary | ICD-10-CM | POA: Diagnosis not present

## 2022-03-29 DIAGNOSIS — S72141D Displaced intertrochanteric fracture of right femur, subsequent encounter for closed fracture with routine healing: Secondary | ICD-10-CM | POA: Diagnosis not present

## 2022-03-29 DIAGNOSIS — M545 Low back pain, unspecified: Secondary | ICD-10-CM | POA: Diagnosis not present

## 2022-03-29 DIAGNOSIS — R278 Other lack of coordination: Secondary | ICD-10-CM | POA: Diagnosis not present

## 2022-03-29 DIAGNOSIS — M6281 Muscle weakness (generalized): Secondary | ICD-10-CM | POA: Diagnosis not present

## 2022-03-30 DIAGNOSIS — S72141D Displaced intertrochanteric fracture of right femur, subsequent encounter for closed fracture with routine healing: Secondary | ICD-10-CM | POA: Diagnosis not present

## 2022-03-30 DIAGNOSIS — J449 Chronic obstructive pulmonary disease, unspecified: Secondary | ICD-10-CM | POA: Diagnosis not present

## 2022-03-30 DIAGNOSIS — M545 Low back pain, unspecified: Secondary | ICD-10-CM | POA: Diagnosis not present

## 2022-03-30 DIAGNOSIS — M6281 Muscle weakness (generalized): Secondary | ICD-10-CM | POA: Diagnosis not present

## 2022-03-30 DIAGNOSIS — R278 Other lack of coordination: Secondary | ICD-10-CM | POA: Diagnosis not present

## 2022-03-30 DIAGNOSIS — R2681 Unsteadiness on feet: Secondary | ICD-10-CM | POA: Diagnosis not present

## 2022-04-02 DIAGNOSIS — R2681 Unsteadiness on feet: Secondary | ICD-10-CM | POA: Diagnosis not present

## 2022-04-02 DIAGNOSIS — J449 Chronic obstructive pulmonary disease, unspecified: Secondary | ICD-10-CM | POA: Diagnosis not present

## 2022-04-02 DIAGNOSIS — M545 Low back pain, unspecified: Secondary | ICD-10-CM | POA: Diagnosis not present

## 2022-04-02 DIAGNOSIS — R278 Other lack of coordination: Secondary | ICD-10-CM | POA: Diagnosis not present

## 2022-04-02 DIAGNOSIS — S72141D Displaced intertrochanteric fracture of right femur, subsequent encounter for closed fracture with routine healing: Secondary | ICD-10-CM | POA: Diagnosis not present

## 2022-04-02 DIAGNOSIS — M6281 Muscle weakness (generalized): Secondary | ICD-10-CM | POA: Diagnosis not present

## 2022-04-03 ENCOUNTER — Ambulatory Visit: Payer: Medicare Other | Admitting: Rheumatology

## 2022-04-03 DIAGNOSIS — S72141D Displaced intertrochanteric fracture of right femur, subsequent encounter for closed fracture with routine healing: Secondary | ICD-10-CM | POA: Diagnosis not present

## 2022-04-03 DIAGNOSIS — M545 Low back pain, unspecified: Secondary | ICD-10-CM | POA: Diagnosis not present

## 2022-04-03 DIAGNOSIS — J449 Chronic obstructive pulmonary disease, unspecified: Secondary | ICD-10-CM | POA: Diagnosis not present

## 2022-04-03 DIAGNOSIS — M6281 Muscle weakness (generalized): Secondary | ICD-10-CM | POA: Diagnosis not present

## 2022-04-03 DIAGNOSIS — R2681 Unsteadiness on feet: Secondary | ICD-10-CM | POA: Diagnosis not present

## 2022-04-03 DIAGNOSIS — R278 Other lack of coordination: Secondary | ICD-10-CM | POA: Diagnosis not present

## 2022-04-04 ENCOUNTER — Encounter (INDEPENDENT_AMBULATORY_CARE_PROVIDER_SITE_OTHER): Payer: Medicare Other | Admitting: Pulmonary Disease

## 2022-04-04 ENCOUNTER — Encounter: Payer: Self-pay | Admitting: Internal Medicine

## 2022-04-04 DIAGNOSIS — R2681 Unsteadiness on feet: Secondary | ICD-10-CM | POA: Diagnosis not present

## 2022-04-04 DIAGNOSIS — Z79899 Other long term (current) drug therapy: Secondary | ICD-10-CM

## 2022-04-04 DIAGNOSIS — S72141D Displaced intertrochanteric fracture of right femur, subsequent encounter for closed fracture with routine healing: Secondary | ICD-10-CM | POA: Diagnosis not present

## 2022-04-04 DIAGNOSIS — L89103 Pressure ulcer of unspecified part of back, stage 3: Secondary | ICD-10-CM | POA: Diagnosis not present

## 2022-04-04 DIAGNOSIS — J449 Chronic obstructive pulmonary disease, unspecified: Secondary | ICD-10-CM | POA: Diagnosis not present

## 2022-04-04 DIAGNOSIS — M6281 Muscle weakness (generalized): Secondary | ICD-10-CM | POA: Diagnosis not present

## 2022-04-04 DIAGNOSIS — M545 Low back pain, unspecified: Secondary | ICD-10-CM | POA: Diagnosis not present

## 2022-04-04 DIAGNOSIS — Z87898 Personal history of other specified conditions: Secondary | ICD-10-CM | POA: Diagnosis not present

## 2022-04-04 DIAGNOSIS — R278 Other lack of coordination: Secondary | ICD-10-CM | POA: Diagnosis not present

## 2022-04-04 DIAGNOSIS — F330 Major depressive disorder, recurrent, mild: Secondary | ICD-10-CM | POA: Diagnosis not present

## 2022-04-04 MED ORDER — YUPELRI 175 MCG/3ML IN SOLN: 175.0000 ug | Freq: Every day | RESPIRATORY_TRACT | 0 refills | Status: DC

## 2022-04-05 DIAGNOSIS — R2681 Unsteadiness on feet: Secondary | ICD-10-CM | POA: Diagnosis not present

## 2022-04-05 DIAGNOSIS — R278 Other lack of coordination: Secondary | ICD-10-CM | POA: Diagnosis not present

## 2022-04-05 DIAGNOSIS — J449 Chronic obstructive pulmonary disease, unspecified: Secondary | ICD-10-CM | POA: Diagnosis not present

## 2022-04-05 DIAGNOSIS — M545 Low back pain, unspecified: Secondary | ICD-10-CM | POA: Diagnosis not present

## 2022-04-05 DIAGNOSIS — M6281 Muscle weakness (generalized): Secondary | ICD-10-CM | POA: Diagnosis not present

## 2022-04-05 DIAGNOSIS — S72141D Displaced intertrochanteric fracture of right femur, subsequent encounter for closed fracture with routine healing: Secondary | ICD-10-CM | POA: Diagnosis not present

## 2022-04-06 DIAGNOSIS — R2681 Unsteadiness on feet: Secondary | ICD-10-CM | POA: Diagnosis not present

## 2022-04-06 DIAGNOSIS — J449 Chronic obstructive pulmonary disease, unspecified: Secondary | ICD-10-CM | POA: Diagnosis not present

## 2022-04-06 DIAGNOSIS — M545 Low back pain, unspecified: Secondary | ICD-10-CM | POA: Diagnosis not present

## 2022-04-06 DIAGNOSIS — R278 Other lack of coordination: Secondary | ICD-10-CM | POA: Diagnosis not present

## 2022-04-06 DIAGNOSIS — S72141D Displaced intertrochanteric fracture of right femur, subsequent encounter for closed fracture with routine healing: Secondary | ICD-10-CM | POA: Diagnosis not present

## 2022-04-06 DIAGNOSIS — M6281 Muscle weakness (generalized): Secondary | ICD-10-CM | POA: Diagnosis not present

## 2022-04-06 NOTE — Progress Notes (Signed)
No ICM remote transmission received for 04/02/2022 and next ICM transmission scheduled for 04/23/2022.

## 2022-04-09 ENCOUNTER — Other Ambulatory Visit (HOSPITAL_COMMUNITY): Payer: Self-pay

## 2022-04-09 DIAGNOSIS — R2681 Unsteadiness on feet: Secondary | ICD-10-CM | POA: Diagnosis not present

## 2022-04-09 DIAGNOSIS — M545 Low back pain, unspecified: Secondary | ICD-10-CM | POA: Diagnosis not present

## 2022-04-09 DIAGNOSIS — S72141D Displaced intertrochanteric fracture of right femur, subsequent encounter for closed fracture with routine healing: Secondary | ICD-10-CM | POA: Diagnosis not present

## 2022-04-09 DIAGNOSIS — J449 Chronic obstructive pulmonary disease, unspecified: Secondary | ICD-10-CM | POA: Diagnosis not present

## 2022-04-09 DIAGNOSIS — R278 Other lack of coordination: Secondary | ICD-10-CM | POA: Diagnosis not present

## 2022-04-09 DIAGNOSIS — M6281 Muscle weakness (generalized): Secondary | ICD-10-CM | POA: Diagnosis not present

## 2022-04-09 NOTE — Telephone Encounter (Signed)
Future lab orders for CBC and CMP placed today. Patient is currently at [facility] after a fall and right hip fracture.  Knox Saliva, PharmD, MPH, BCPS, CPP Clinical Pharmacist (Rheumatology and Pulmonology)

## 2022-04-10 DIAGNOSIS — S72141D Displaced intertrochanteric fracture of right femur, subsequent encounter for closed fracture with routine healing: Secondary | ICD-10-CM | POA: Diagnosis not present

## 2022-04-10 DIAGNOSIS — R2681 Unsteadiness on feet: Secondary | ICD-10-CM | POA: Diagnosis not present

## 2022-04-10 DIAGNOSIS — M6281 Muscle weakness (generalized): Secondary | ICD-10-CM | POA: Diagnosis not present

## 2022-04-10 DIAGNOSIS — M545 Low back pain, unspecified: Secondary | ICD-10-CM | POA: Diagnosis not present

## 2022-04-10 DIAGNOSIS — R278 Other lack of coordination: Secondary | ICD-10-CM | POA: Diagnosis not present

## 2022-04-10 DIAGNOSIS — J449 Chronic obstructive pulmonary disease, unspecified: Secondary | ICD-10-CM | POA: Diagnosis not present

## 2022-04-11 DIAGNOSIS — M545 Low back pain, unspecified: Secondary | ICD-10-CM | POA: Diagnosis not present

## 2022-04-11 DIAGNOSIS — S72141D Displaced intertrochanteric fracture of right femur, subsequent encounter for closed fracture with routine healing: Secondary | ICD-10-CM | POA: Diagnosis not present

## 2022-04-11 DIAGNOSIS — M6281 Muscle weakness (generalized): Secondary | ICD-10-CM | POA: Diagnosis not present

## 2022-04-11 DIAGNOSIS — R278 Other lack of coordination: Secondary | ICD-10-CM | POA: Diagnosis not present

## 2022-04-11 DIAGNOSIS — J449 Chronic obstructive pulmonary disease, unspecified: Secondary | ICD-10-CM | POA: Diagnosis not present

## 2022-04-11 DIAGNOSIS — L89103 Pressure ulcer of unspecified part of back, stage 3: Secondary | ICD-10-CM | POA: Diagnosis not present

## 2022-04-11 DIAGNOSIS — R2681 Unsteadiness on feet: Secondary | ICD-10-CM | POA: Diagnosis not present

## 2022-04-12 ENCOUNTER — Other Ambulatory Visit: Payer: Medicare Other

## 2022-04-12 DIAGNOSIS — R278 Other lack of coordination: Secondary | ICD-10-CM | POA: Diagnosis not present

## 2022-04-12 DIAGNOSIS — J449 Chronic obstructive pulmonary disease, unspecified: Secondary | ICD-10-CM | POA: Diagnosis not present

## 2022-04-12 DIAGNOSIS — S72141D Displaced intertrochanteric fracture of right femur, subsequent encounter for closed fracture with routine healing: Secondary | ICD-10-CM | POA: Diagnosis not present

## 2022-04-12 DIAGNOSIS — M545 Low back pain, unspecified: Secondary | ICD-10-CM | POA: Diagnosis not present

## 2022-04-12 DIAGNOSIS — R2681 Unsteadiness on feet: Secondary | ICD-10-CM | POA: Diagnosis not present

## 2022-04-12 DIAGNOSIS — M6281 Muscle weakness (generalized): Secondary | ICD-10-CM | POA: Diagnosis not present

## 2022-04-13 DIAGNOSIS — M6281 Muscle weakness (generalized): Secondary | ICD-10-CM | POA: Diagnosis not present

## 2022-04-13 DIAGNOSIS — S72141D Displaced intertrochanteric fracture of right femur, subsequent encounter for closed fracture with routine healing: Secondary | ICD-10-CM | POA: Diagnosis not present

## 2022-04-13 DIAGNOSIS — J449 Chronic obstructive pulmonary disease, unspecified: Secondary | ICD-10-CM | POA: Diagnosis not present

## 2022-04-13 DIAGNOSIS — R2681 Unsteadiness on feet: Secondary | ICD-10-CM | POA: Diagnosis not present

## 2022-04-13 DIAGNOSIS — R278 Other lack of coordination: Secondary | ICD-10-CM | POA: Diagnosis not present

## 2022-04-13 DIAGNOSIS — M545 Low back pain, unspecified: Secondary | ICD-10-CM | POA: Diagnosis not present

## 2022-04-16 DIAGNOSIS — R2681 Unsteadiness on feet: Secondary | ICD-10-CM | POA: Diagnosis not present

## 2022-04-16 DIAGNOSIS — N390 Urinary tract infection, site not specified: Secondary | ICD-10-CM | POA: Diagnosis not present

## 2022-04-16 DIAGNOSIS — M545 Low back pain, unspecified: Secondary | ICD-10-CM | POA: Diagnosis not present

## 2022-04-16 DIAGNOSIS — M6281 Muscle weakness (generalized): Secondary | ICD-10-CM | POA: Diagnosis not present

## 2022-04-16 DIAGNOSIS — S72141D Displaced intertrochanteric fracture of right femur, subsequent encounter for closed fracture with routine healing: Secondary | ICD-10-CM | POA: Diagnosis not present

## 2022-04-16 DIAGNOSIS — R278 Other lack of coordination: Secondary | ICD-10-CM | POA: Diagnosis not present

## 2022-04-16 DIAGNOSIS — J449 Chronic obstructive pulmonary disease, unspecified: Secondary | ICD-10-CM | POA: Diagnosis not present

## 2022-04-17 DIAGNOSIS — M545 Low back pain, unspecified: Secondary | ICD-10-CM | POA: Diagnosis not present

## 2022-04-17 DIAGNOSIS — S72141D Displaced intertrochanteric fracture of right femur, subsequent encounter for closed fracture with routine healing: Secondary | ICD-10-CM | POA: Diagnosis not present

## 2022-04-17 DIAGNOSIS — J449 Chronic obstructive pulmonary disease, unspecified: Secondary | ICD-10-CM | POA: Diagnosis not present

## 2022-04-17 DIAGNOSIS — R278 Other lack of coordination: Secondary | ICD-10-CM | POA: Diagnosis not present

## 2022-04-17 DIAGNOSIS — M6281 Muscle weakness (generalized): Secondary | ICD-10-CM | POA: Diagnosis not present

## 2022-04-17 DIAGNOSIS — R2681 Unsteadiness on feet: Secondary | ICD-10-CM | POA: Diagnosis not present

## 2022-04-18 DIAGNOSIS — S72141D Displaced intertrochanteric fracture of right femur, subsequent encounter for closed fracture with routine healing: Secondary | ICD-10-CM | POA: Diagnosis not present

## 2022-04-18 DIAGNOSIS — R278 Other lack of coordination: Secondary | ICD-10-CM | POA: Diagnosis not present

## 2022-04-18 DIAGNOSIS — M545 Low back pain, unspecified: Secondary | ICD-10-CM | POA: Diagnosis not present

## 2022-04-18 DIAGNOSIS — L89103 Pressure ulcer of unspecified part of back, stage 3: Secondary | ICD-10-CM | POA: Diagnosis not present

## 2022-04-18 DIAGNOSIS — J449 Chronic obstructive pulmonary disease, unspecified: Secondary | ICD-10-CM | POA: Diagnosis not present

## 2022-04-18 DIAGNOSIS — R2681 Unsteadiness on feet: Secondary | ICD-10-CM | POA: Diagnosis not present

## 2022-04-18 DIAGNOSIS — M6281 Muscle weakness (generalized): Secondary | ICD-10-CM | POA: Diagnosis not present

## 2022-04-18 NOTE — Telephone Encounter (Signed)
Mychart message was sent to pt 6/14 about medications and all was handled in that encounter.

## 2022-04-19 DIAGNOSIS — M6281 Muscle weakness (generalized): Secondary | ICD-10-CM | POA: Diagnosis not present

## 2022-04-19 DIAGNOSIS — M545 Low back pain, unspecified: Secondary | ICD-10-CM | POA: Diagnosis not present

## 2022-04-19 DIAGNOSIS — S72141D Displaced intertrochanteric fracture of right femur, subsequent encounter for closed fracture with routine healing: Secondary | ICD-10-CM | POA: Diagnosis not present

## 2022-04-19 DIAGNOSIS — R2681 Unsteadiness on feet: Secondary | ICD-10-CM | POA: Diagnosis not present

## 2022-04-19 DIAGNOSIS — R278 Other lack of coordination: Secondary | ICD-10-CM | POA: Diagnosis not present

## 2022-04-19 DIAGNOSIS — J449 Chronic obstructive pulmonary disease, unspecified: Secondary | ICD-10-CM | POA: Diagnosis not present

## 2022-04-20 DIAGNOSIS — J449 Chronic obstructive pulmonary disease, unspecified: Secondary | ICD-10-CM | POA: Diagnosis not present

## 2022-04-20 DIAGNOSIS — M6281 Muscle weakness (generalized): Secondary | ICD-10-CM | POA: Diagnosis not present

## 2022-04-20 DIAGNOSIS — R278 Other lack of coordination: Secondary | ICD-10-CM | POA: Diagnosis not present

## 2022-04-20 DIAGNOSIS — N390 Urinary tract infection, site not specified: Secondary | ICD-10-CM | POA: Diagnosis not present

## 2022-04-20 DIAGNOSIS — R2681 Unsteadiness on feet: Secondary | ICD-10-CM | POA: Diagnosis not present

## 2022-04-20 DIAGNOSIS — S72141D Displaced intertrochanteric fracture of right femur, subsequent encounter for closed fracture with routine healing: Secondary | ICD-10-CM | POA: Diagnosis not present

## 2022-04-20 DIAGNOSIS — M545 Low back pain, unspecified: Secondary | ICD-10-CM | POA: Diagnosis not present

## 2022-04-20 DIAGNOSIS — Z79899 Other long term (current) drug therapy: Secondary | ICD-10-CM | POA: Diagnosis not present

## 2022-04-23 DIAGNOSIS — Z4789 Encounter for other orthopedic aftercare: Secondary | ICD-10-CM | POA: Diagnosis not present

## 2022-04-23 DIAGNOSIS — R278 Other lack of coordination: Secondary | ICD-10-CM | POA: Diagnosis not present

## 2022-04-23 DIAGNOSIS — I951 Orthostatic hypotension: Secondary | ICD-10-CM | POA: Diagnosis not present

## 2022-04-23 DIAGNOSIS — R279 Unspecified lack of coordination: Secondary | ICD-10-CM | POA: Diagnosis not present

## 2022-04-23 DIAGNOSIS — M545 Low back pain, unspecified: Secondary | ICD-10-CM | POA: Diagnosis not present

## 2022-04-23 DIAGNOSIS — R2681 Unsteadiness on feet: Secondary | ICD-10-CM | POA: Diagnosis not present

## 2022-04-23 DIAGNOSIS — M6281 Muscle weakness (generalized): Secondary | ICD-10-CM | POA: Diagnosis not present

## 2022-04-23 DIAGNOSIS — Z9181 History of falling: Secondary | ICD-10-CM | POA: Diagnosis not present

## 2022-04-23 DIAGNOSIS — R293 Abnormal posture: Secondary | ICD-10-CM | POA: Diagnosis not present

## 2022-04-23 DIAGNOSIS — J449 Chronic obstructive pulmonary disease, unspecified: Secondary | ICD-10-CM | POA: Diagnosis not present

## 2022-04-23 DIAGNOSIS — S72141D Displaced intertrochanteric fracture of right femur, subsequent encounter for closed fracture with routine healing: Secondary | ICD-10-CM | POA: Diagnosis not present

## 2022-04-24 DIAGNOSIS — N390 Urinary tract infection, site not specified: Secondary | ICD-10-CM | POA: Diagnosis not present

## 2022-04-24 DIAGNOSIS — R278 Other lack of coordination: Secondary | ICD-10-CM | POA: Diagnosis not present

## 2022-04-24 DIAGNOSIS — R2681 Unsteadiness on feet: Secondary | ICD-10-CM | POA: Diagnosis not present

## 2022-04-24 DIAGNOSIS — M545 Low back pain, unspecified: Secondary | ICD-10-CM | POA: Diagnosis not present

## 2022-04-24 DIAGNOSIS — S72141D Displaced intertrochanteric fracture of right femur, subsequent encounter for closed fracture with routine healing: Secondary | ICD-10-CM | POA: Diagnosis not present

## 2022-04-24 DIAGNOSIS — M6281 Muscle weakness (generalized): Secondary | ICD-10-CM | POA: Diagnosis not present

## 2022-04-24 DIAGNOSIS — J449 Chronic obstructive pulmonary disease, unspecified: Secondary | ICD-10-CM | POA: Diagnosis not present

## 2022-04-24 DIAGNOSIS — Z79899 Other long term (current) drug therapy: Secondary | ICD-10-CM | POA: Diagnosis not present

## 2022-04-25 DIAGNOSIS — L89103 Pressure ulcer of unspecified part of back, stage 3: Secondary | ICD-10-CM | POA: Diagnosis not present

## 2022-04-26 ENCOUNTER — Telehealth: Payer: Self-pay

## 2022-04-26 ENCOUNTER — Telehealth: Payer: Self-pay | Admitting: Pulmonary Disease

## 2022-04-26 DIAGNOSIS — N390 Urinary tract infection, site not specified: Secondary | ICD-10-CM | POA: Diagnosis not present

## 2022-04-26 DIAGNOSIS — Z79899 Other long term (current) drug therapy: Secondary | ICD-10-CM | POA: Diagnosis not present

## 2022-04-26 MED ORDER — REVEFENACIN 175 MCG/3ML IN SOLN
175.0000 ug | Freq: Every day | RESPIRATORY_TRACT | 3 refills | Status: AC
Start: 2022-04-26 — End: ?

## 2022-04-26 MED ORDER — REVEFENACIN 175 MCG/3ML IN SOLN: 175.0000 ug | Freq: Every day | RESPIRATORY_TRACT | 0 refills | Status: DC

## 2022-04-26 NOTE — Telephone Encounter (Signed)
Called and spoke with [name]. She was asking if we had any samples of Yupelri. I advised her that we do and that I would leave 2 samples up front for him. She also mentioned that the facility has been having a hard time supplying the [facility] and she wanted to know if we had any alternative ways to get the medication. I offered to send the medication to [facility] and she agreed.   RX to [name] has been sent.   Nothing further needed at time of call.

## 2022-04-26 NOTE — Telephone Encounter (Signed)
LMOVM for patient to send missed ICM transmission. 

## 2022-04-27 NOTE — Progress Notes (Signed)
No ICM remote transmission received for 04/23/2022 and next ICM transmission scheduled for 04/24/2022.

## 2022-05-01 DIAGNOSIS — N401 Enlarged prostate with lower urinary tract symptoms: Secondary | ICD-10-CM | POA: Diagnosis not present

## 2022-05-01 DIAGNOSIS — R3914 Feeling of incomplete bladder emptying: Secondary | ICD-10-CM | POA: Diagnosis not present

## 2022-05-02 ENCOUNTER — Ambulatory Visit (INDEPENDENT_AMBULATORY_CARE_PROVIDER_SITE_OTHER): Payer: Medicare Other

## 2022-05-02 DIAGNOSIS — I255 Ischemic cardiomyopathy: Secondary | ICD-10-CM

## 2022-05-02 DIAGNOSIS — L89103 Pressure ulcer of unspecified part of back, stage 3: Secondary | ICD-10-CM | POA: Diagnosis not present

## 2022-05-02 LAB — CUP PACEART REMOTE DEVICE CHECK
Battery Remaining Longevity: 31 mo
Battery Voltage: 2.95 V
Brady Statistic AP VP Percent: 0.12 %
Brady Statistic AP VS Percent: 31.95 %
Brady Statistic AS VP Percent: 0.07 %
Brady Statistic AS VS Percent: 67.86 %
Brady Statistic RA Percent Paced: 31.96 %
Brady Statistic RV Percent Paced: 0.19 %
Date Time Interrogation Session: 20230712054346
HighPow Impedance: 36 Ohm
HighPow Impedance: 41 Ohm
Implantable Lead Implant Date: 20090623
Implantable Lead Implant Date: 20090623
Implantable Lead Location: 753859
Implantable Lead Location: 753860
Implantable Lead Model: 5076
Implantable Lead Model: 6947
Implantable Pulse Generator Implant Date: 20170616
Lead Channel Impedance Value: 247 Ohm
Lead Channel Impedance Value: 342 Ohm
Lead Channel Impedance Value: 399 Ohm
Lead Channel Pacing Threshold Amplitude: 0.875 V
Lead Channel Pacing Threshold Amplitude: 1.25 V
Lead Channel Pacing Threshold Pulse Width: 0.4 ms
Lead Channel Pacing Threshold Pulse Width: 0.4 ms
Lead Channel Sensing Intrinsic Amplitude: 0.875 mV
Lead Channel Sensing Intrinsic Amplitude: 0.875 mV
Lead Channel Sensing Intrinsic Amplitude: 5.125 mV
Lead Channel Sensing Intrinsic Amplitude: 5.125 mV
Lead Channel Setting Pacing Amplitude: 2.5 V
Lead Channel Setting Pacing Amplitude: 2.5 V
Lead Channel Setting Pacing Pulse Width: 0.4 ms
Lead Channel Setting Sensing Sensitivity: 0.6 mV

## 2022-05-03 ENCOUNTER — Emergency Department (HOSPITAL_COMMUNITY): Payer: Medicare Other

## 2022-05-03 ENCOUNTER — Inpatient Hospital Stay (HOSPITAL_COMMUNITY)
Admission: EM | Admit: 2022-05-03 | Discharge: 2022-05-22 | DRG: 308 | Disposition: E | Payer: Medicare Other | Attending: Pulmonary Disease | Admitting: Pulmonary Disease

## 2022-05-03 ENCOUNTER — Observation Stay (HOSPITAL_COMMUNITY): Payer: Medicare Other

## 2022-05-03 ENCOUNTER — Inpatient Hospital Stay (HOSPITAL_COMMUNITY): Payer: Medicare Other

## 2022-05-03 ENCOUNTER — Other Ambulatory Visit: Payer: Self-pay

## 2022-05-03 ENCOUNTER — Encounter (HOSPITAL_COMMUNITY): Payer: Self-pay

## 2022-05-03 DIAGNOSIS — D631 Anemia in chronic kidney disease: Secondary | ICD-10-CM | POA: Diagnosis present

## 2022-05-03 DIAGNOSIS — R627 Adult failure to thrive: Secondary | ICD-10-CM | POA: Diagnosis present

## 2022-05-03 DIAGNOSIS — I350 Nonrheumatic aortic (valve) stenosis: Secondary | ICD-10-CM | POA: Diagnosis present

## 2022-05-03 DIAGNOSIS — E039 Hypothyroidism, unspecified: Secondary | ICD-10-CM

## 2022-05-03 DIAGNOSIS — R34 Anuria and oliguria: Secondary | ICD-10-CM | POA: Diagnosis present

## 2022-05-03 DIAGNOSIS — I252 Old myocardial infarction: Secondary | ICD-10-CM

## 2022-05-03 DIAGNOSIS — I469 Cardiac arrest, cause unspecified: Secondary | ICD-10-CM | POA: Diagnosis not present

## 2022-05-03 DIAGNOSIS — I130 Hypertensive heart and chronic kidney disease with heart failure and stage 1 through stage 4 chronic kidney disease, or unspecified chronic kidney disease: Secondary | ICD-10-CM | POA: Diagnosis present

## 2022-05-03 DIAGNOSIS — I951 Orthostatic hypotension: Secondary | ICD-10-CM | POA: Diagnosis not present

## 2022-05-03 DIAGNOSIS — I2510 Atherosclerotic heart disease of native coronary artery without angina pectoris: Secondary | ICD-10-CM

## 2022-05-03 DIAGNOSIS — D6959 Other secondary thrombocytopenia: Secondary | ICD-10-CM | POA: Diagnosis present

## 2022-05-03 DIAGNOSIS — K5909 Other constipation: Secondary | ICD-10-CM | POA: Diagnosis present

## 2022-05-03 DIAGNOSIS — W19XXXA Unspecified fall, initial encounter: Secondary | ICD-10-CM | POA: Diagnosis not present

## 2022-05-03 DIAGNOSIS — R404 Transient alteration of awareness: Secondary | ICD-10-CM | POA: Diagnosis not present

## 2022-05-03 DIAGNOSIS — G4089 Other seizures: Secondary | ICD-10-CM | POA: Diagnosis not present

## 2022-05-03 DIAGNOSIS — Z85828 Personal history of other malignant neoplasm of skin: Secondary | ICD-10-CM

## 2022-05-03 DIAGNOSIS — S12300A Unspecified displaced fracture of fourth cervical vertebra, initial encounter for closed fracture: Secondary | ICD-10-CM | POA: Diagnosis present

## 2022-05-03 DIAGNOSIS — D539 Nutritional anemia, unspecified: Secondary | ICD-10-CM | POA: Diagnosis present

## 2022-05-03 DIAGNOSIS — Z7189 Other specified counseling: Secondary | ICD-10-CM

## 2022-05-03 DIAGNOSIS — S12000A Unspecified displaced fracture of first cervical vertebra, initial encounter for closed fracture: Secondary | ICD-10-CM | POA: Diagnosis not present

## 2022-05-03 DIAGNOSIS — E872 Acidosis, unspecified: Secondary | ICD-10-CM | POA: Diagnosis present

## 2022-05-03 DIAGNOSIS — J811 Chronic pulmonary edema: Secondary | ICD-10-CM | POA: Diagnosis not present

## 2022-05-03 DIAGNOSIS — S12301A Unspecified nondisplaced fracture of fourth cervical vertebra, initial encounter for closed fracture: Secondary | ICD-10-CM | POA: Diagnosis present

## 2022-05-03 DIAGNOSIS — Y92121 Bathroom in nursing home as the place of occurrence of the external cause: Secondary | ICD-10-CM

## 2022-05-03 DIAGNOSIS — Z95810 Presence of automatic (implantable) cardiac defibrillator: Secondary | ICD-10-CM | POA: Diagnosis not present

## 2022-05-03 DIAGNOSIS — R7401 Elevation of levels of liver transaminase levels: Secondary | ICD-10-CM

## 2022-05-03 DIAGNOSIS — Z87891 Personal history of nicotine dependence: Secondary | ICD-10-CM

## 2022-05-03 DIAGNOSIS — I472 Ventricular tachycardia, unspecified: Principal | ICD-10-CM | POA: Diagnosis present

## 2022-05-03 DIAGNOSIS — E871 Hypo-osmolality and hyponatremia: Secondary | ICD-10-CM | POA: Diagnosis present

## 2022-05-03 DIAGNOSIS — N179 Acute kidney failure, unspecified: Secondary | ICD-10-CM | POA: Diagnosis not present

## 2022-05-03 DIAGNOSIS — Z79890 Hormone replacement therapy (postmenopausal): Secondary | ICD-10-CM

## 2022-05-03 DIAGNOSIS — D649 Anemia, unspecified: Secondary | ICD-10-CM | POA: Diagnosis present

## 2022-05-03 DIAGNOSIS — J9811 Atelectasis: Secondary | ICD-10-CM | POA: Diagnosis not present

## 2022-05-03 DIAGNOSIS — Z8781 Personal history of (healed) traumatic fracture: Secondary | ICD-10-CM

## 2022-05-03 DIAGNOSIS — J90 Pleural effusion, not elsewhere classified: Secondary | ICD-10-CM | POA: Diagnosis not present

## 2022-05-03 DIAGNOSIS — M7989 Other specified soft tissue disorders: Secondary | ICD-10-CM | POA: Diagnosis not present

## 2022-05-03 DIAGNOSIS — Z79899 Other long term (current) drug therapy: Secondary | ICD-10-CM | POA: Diagnosis not present

## 2022-05-03 DIAGNOSIS — Z9981 Dependence on supplemental oxygen: Secondary | ICD-10-CM

## 2022-05-03 DIAGNOSIS — Z811 Family history of alcohol abuse and dependence: Secondary | ICD-10-CM

## 2022-05-03 DIAGNOSIS — R4181 Age-related cognitive decline: Secondary | ICD-10-CM | POA: Diagnosis not present

## 2022-05-03 DIAGNOSIS — W1811XA Fall from or off toilet without subsequent striking against object, initial encounter: Secondary | ICD-10-CM | POA: Diagnosis present

## 2022-05-03 DIAGNOSIS — I5043 Acute on chronic combined systolic (congestive) and diastolic (congestive) heart failure: Secondary | ICD-10-CM | POA: Diagnosis present

## 2022-05-03 DIAGNOSIS — N183 Chronic kidney disease, stage 3 unspecified: Secondary | ICD-10-CM | POA: Diagnosis present

## 2022-05-03 DIAGNOSIS — M25571 Pain in right ankle and joints of right foot: Secondary | ICD-10-CM | POA: Diagnosis not present

## 2022-05-03 DIAGNOSIS — D696 Thrombocytopenia, unspecified: Secondary | ICD-10-CM

## 2022-05-03 DIAGNOSIS — R55 Syncope and collapse: Secondary | ICD-10-CM | POA: Diagnosis present

## 2022-05-03 DIAGNOSIS — G9340 Encephalopathy, unspecified: Secondary | ICD-10-CM | POA: Diagnosis present

## 2022-05-03 DIAGNOSIS — Z7982 Long term (current) use of aspirin: Secondary | ICD-10-CM | POA: Diagnosis not present

## 2022-05-03 DIAGNOSIS — R000 Tachycardia, unspecified: Secondary | ICD-10-CM | POA: Diagnosis not present

## 2022-05-03 DIAGNOSIS — N289 Disorder of kidney and ureter, unspecified: Secondary | ICD-10-CM

## 2022-05-03 DIAGNOSIS — R569 Unspecified convulsions: Principal | ICD-10-CM

## 2022-05-03 DIAGNOSIS — S0181XA Laceration without foreign body of other part of head, initial encounter: Secondary | ICD-10-CM | POA: Diagnosis present

## 2022-05-03 DIAGNOSIS — J449 Chronic obstructive pulmonary disease, unspecified: Secondary | ICD-10-CM | POA: Diagnosis present

## 2022-05-03 DIAGNOSIS — Z808 Family history of malignant neoplasm of other organs or systems: Secondary | ICD-10-CM

## 2022-05-03 DIAGNOSIS — Z8249 Family history of ischemic heart disease and other diseases of the circulatory system: Secondary | ICD-10-CM

## 2022-05-03 DIAGNOSIS — M19071 Primary osteoarthritis, right ankle and foot: Secondary | ICD-10-CM | POA: Diagnosis not present

## 2022-05-03 DIAGNOSIS — I739 Peripheral vascular disease, unspecified: Secondary | ICD-10-CM | POA: Diagnosis present

## 2022-05-03 DIAGNOSIS — Z9889 Other specified postprocedural states: Secondary | ICD-10-CM | POA: Diagnosis not present

## 2022-05-03 DIAGNOSIS — M25561 Pain in right knee: Secondary | ICD-10-CM | POA: Diagnosis not present

## 2022-05-03 DIAGNOSIS — Z955 Presence of coronary angioplasty implant and graft: Secondary | ICD-10-CM

## 2022-05-03 DIAGNOSIS — M810 Age-related osteoporosis without current pathological fracture: Secondary | ICD-10-CM | POA: Diagnosis present

## 2022-05-03 DIAGNOSIS — S129XXA Fracture of neck, unspecified, initial encounter: Secondary | ICD-10-CM

## 2022-05-03 DIAGNOSIS — I959 Hypotension, unspecified: Secondary | ICD-10-CM | POA: Diagnosis not present

## 2022-05-03 DIAGNOSIS — E7800 Pure hypercholesterolemia, unspecified: Secondary | ICD-10-CM | POA: Diagnosis present

## 2022-05-03 DIAGNOSIS — I255 Ischemic cardiomyopathy: Secondary | ICD-10-CM | POA: Diagnosis present

## 2022-05-03 LAB — COMPREHENSIVE METABOLIC PANEL
ALT: 45 U/L — ABNORMAL HIGH (ref 0–44)
ALT: 49 U/L — ABNORMAL HIGH (ref 0–44)
AST: 46 U/L — ABNORMAL HIGH (ref 15–41)
AST: 47 U/L — ABNORMAL HIGH (ref 15–41)
Albumin: 2.8 g/dL — ABNORMAL LOW (ref 3.5–5.0)
Albumin: 2.6 g/dL — ABNORMAL LOW (ref 3.5–5.0)
Alkaline Phosphatase: 112 U/L (ref 38–126)
Alkaline Phosphatase: 106 U/L (ref 38–126)
Anion gap: 18 — ABNORMAL HIGH (ref 5–15)
Anion gap: 14 (ref 5–15)
BUN: 46 mg/dL — ABNORMAL HIGH (ref 8–23)
BUN: 48 mg/dL — ABNORMAL HIGH (ref 8–23)
CO2: 17 mmol/L — ABNORMAL LOW (ref 22–32)
CO2: 19 mmol/L — ABNORMAL LOW (ref 22–32)
Calcium: 8.5 mg/dL — ABNORMAL LOW (ref 8.9–10.3)
Calcium: 8 mg/dL — ABNORMAL LOW (ref 8.9–10.3)
Chloride: 96 mmol/L — ABNORMAL LOW (ref 98–111)
Chloride: 97 mmol/L — ABNORMAL LOW (ref 98–111)
Creatinine, Ser: 1.7 mg/dL — ABNORMAL HIGH (ref 0.61–1.24)
Creatinine, Ser: 1.74 mg/dL — ABNORMAL HIGH (ref 0.61–1.24)
GFR, Estimated: 38 mL/min — ABNORMAL LOW (ref >60)
GFR, Estimated: 37 mL/min — ABNORMAL LOW (ref >60)
Glucose, Bld: 217 mg/dL — ABNORMAL HIGH (ref 70–99)
Glucose, Bld: 177 mg/dL — ABNORMAL HIGH (ref 70–99)
Potassium: 4.3 mmol/L (ref 3.5–5.1)
Potassium: 4.3 mmol/L (ref 3.5–5.1)
Sodium: 131 mmol/L — ABNORMAL LOW (ref 135–145)
Sodium: 130 mmol/L — ABNORMAL LOW (ref 135–145)
Total Bilirubin: 0.4 mg/dL (ref 0.3–1.2)
Total Bilirubin: 0.6 mg/dL (ref 0.3–1.2)
Total Protein: 6 g/dL — ABNORMAL LOW (ref 6.5–8.1)
Total Protein: 5.7 g/dL — ABNORMAL LOW (ref 6.5–8.1)

## 2022-05-03 LAB — RETICULOCYTES
Immature Retic Fract: 32.8 % — ABNORMAL HIGH (ref 2.3–15.9)
RBC.: 2.44 MIL/uL — ABNORMAL LOW (ref 4.22–5.81)
Retic Count, Absolute: 140.8 10*3/uL (ref 19.0–186.0)
Retic Ct Pct: 5.8 % — ABNORMAL HIGH (ref 0.4–3.1)

## 2022-05-03 LAB — PHOSPHORUS: Phosphorus: 4.6 mg/dL (ref 2.5–4.6)

## 2022-05-03 LAB — FERRITIN: Ferritin: 121 ng/mL (ref 24–336)

## 2022-05-03 LAB — CBC
HCT: 23.9 % — ABNORMAL LOW (ref 39.0–52.0)
Hemoglobin: 7.6 g/dL — ABNORMAL LOW (ref 13.0–17.0)
MCH: 31.5 pg (ref 26.0–34.0)
MCHC: 31.8 g/dL (ref 30.0–36.0)
MCV: 99.2 fL (ref 80.0–100.0)
Platelets: 110 10*3/uL — ABNORMAL LOW (ref 150–400)
RBC: 2.41 MIL/uL — ABNORMAL LOW (ref 4.22–5.81)
RDW: 15.1 % (ref 11.5–15.5)
WBC: 10.3 10*3/uL (ref 4.0–10.5)
nRBC: 0.3 % — ABNORMAL HIGH (ref 0.0–0.2)

## 2022-05-03 LAB — TSH: TSH: 2.62 u[IU]/mL (ref 0.350–4.500)

## 2022-05-03 LAB — TROPONIN I (HIGH SENSITIVITY)
Troponin I (High Sensitivity): 1108 ng/L (ref <18)
Troponin I (High Sensitivity): 1153 ng/L (ref <18)

## 2022-05-03 LAB — VITAMIN B12: Vitamin B-12: 674 pg/mL (ref 180–914)

## 2022-05-03 LAB — CBC WITH DIFFERENTIAL/PLATELET
Abs Immature Granulocytes: 0.11 10*3/uL — ABNORMAL HIGH (ref 0.00–0.07)
Basophils Absolute: 0 10*3/uL (ref 0.0–0.1)
Basophils Relative: 0 %
Eosinophils Absolute: 0.2 10*3/uL (ref 0.0–0.5)
Eosinophils Relative: 2 %
HCT: 26.7 % — ABNORMAL LOW (ref 39.0–52.0)
Hemoglobin: 8.4 g/dL — ABNORMAL LOW (ref 13.0–17.0)
Immature Granulocytes: 1 %
Lymphocytes Relative: 10 %
Lymphs Abs: 1 10*3/uL (ref 0.7–4.0)
MCH: 31.8 pg (ref 26.0–34.0)
MCHC: 31.5 g/dL (ref 30.0–36.0)
MCV: 101.1 fL — ABNORMAL HIGH (ref 80.0–100.0)
Monocytes Absolute: 0.4 10*3/uL (ref 0.1–1.0)
Monocytes Relative: 4 %
Neutro Abs: 8.1 10*3/uL — ABNORMAL HIGH (ref 1.7–7.7)
Neutrophils Relative %: 83 %
Platelets: 105 10*3/uL — ABNORMAL LOW (ref 150–400)
RBC: 2.64 MIL/uL — ABNORMAL LOW (ref 4.22–5.81)
RDW: 15.3 % (ref 11.5–15.5)
WBC: 9.7 10*3/uL (ref 4.0–10.5)
nRBC: 0.3 % — ABNORMAL HIGH (ref 0.0–0.2)

## 2022-05-03 LAB — LACTIC ACID, PLASMA
Lactic Acid, Venous: 4.1 mmol/L (ref 0.5–1.9)
Lactic Acid, Venous: 3.2 mmol/L (ref 0.5–1.9)
Lactic Acid, Venous: 3.7 mmol/L (ref 0.5–1.9)

## 2022-05-03 LAB — IRON AND TIBC
Iron: 21 ug/dL — ABNORMAL LOW (ref 45–182)
Saturation Ratios: 7 % — ABNORMAL LOW (ref 17.9–39.5)
TIBC: 298 ug/dL (ref 250–450)
UIBC: 277 ug/dL

## 2022-05-03 LAB — FOLATE: Folate: 9.7 ng/mL (ref >5.9)

## 2022-05-03 LAB — PREPARE RBC (CROSSMATCH)

## 2022-05-03 LAB — MAGNESIUM
Magnesium: 2.5 mg/dL — ABNORMAL HIGH (ref 1.7–2.4)
Magnesium: 2.5 mg/dL — ABNORMAL HIGH (ref 1.7–2.4)

## 2022-05-03 LAB — GLUCOSE, CAPILLARY
Glucose-Capillary: 134 mg/dL — ABNORMAL HIGH (ref 70–99)
Glucose-Capillary: 132 mg/dL — ABNORMAL HIGH (ref 70–99)
Glucose-Capillary: 127 mg/dL — ABNORMAL HIGH (ref 70–99)

## 2022-05-03 LAB — CK: Total CK: 46 U/L — ABNORMAL LOW (ref 49–397)

## 2022-05-03 MED ORDER — FUROSEMIDE 10 MG/ML IJ SOLN
60.0000 mg | Freq: Once | INTRAMUSCULAR | Status: AC
  Administered 2022-05-03: 60 mg via INTRAVENOUS
  Filled 2022-05-03: qty 6

## 2022-05-03 MED ORDER — AMIODARONE LOAD VIA INFUSION
150.0000 mg | Freq: Once | INTRAVENOUS | Status: AC
  Administered 2022-05-03: 150 mg via INTRAVENOUS
  Filled 2022-05-03: qty 83.34

## 2022-05-03 MED ORDER — LIDOCAINE IN D5W 4-5 MG/ML-% IV SOLN
1.0000 mg/min | INTRAVENOUS | Status: DC
  Administered 2022-05-03: 1 mg/min via INTRAVENOUS
  Filled 2022-05-03: qty 500

## 2022-05-03 MED ORDER — DOCUSATE SODIUM 100 MG PO CAPS
100.0000 mg | ORAL_CAPSULE | Freq: Two times a day (BID) | ORAL | Status: DC | PRN
  Filled 2022-05-03: qty 1

## 2022-05-03 MED ORDER — LEVOTHYROXINE SODIUM 25 MCG PO TABS: 125.0000 ug | ORAL_TABLET | Freq: Every day | ORAL | Status: DC

## 2022-05-03 MED ORDER — ACETAMINOPHEN 325 MG PO TABS: 650.0000 mg | ORAL_TABLET | Freq: Four times a day (QID) | ORAL | Status: DC | PRN

## 2022-05-03 MED ORDER — INSULIN ASPART 100 UNIT/ML IJ SOLN
0.0000 [IU] | INTRAMUSCULAR | Status: DC
  Administered 2022-05-03: 1 [IU] via SUBCUTANEOUS

## 2022-05-03 MED ORDER — DICLOFENAC SODIUM 1 % EX GEL
2.0000 g | Freq: Four times a day (QID) | CUTANEOUS | Status: DC
Start: 2022-05-03 — End: 2022-05-04
  Administered 2022-05-03 (×2): 2 g via TOPICAL
  Filled 2022-05-03: qty 100

## 2022-05-03 MED ORDER — AMIODARONE HCL IN DEXTROSE 360-4.14 MG/200ML-% IV SOLN
60.0000 mg/h | INTRAVENOUS | Status: DC
  Administered 2022-05-03: 60 mg/h via INTRAVENOUS

## 2022-05-03 MED ORDER — AMIODARONE HCL IN DEXTROSE 360-4.14 MG/200ML-% IV SOLN
60.0000 mg/h | INTRAVENOUS | Status: AC
  Administered 2022-05-03: 60 mg/h via INTRAVENOUS
  Filled 2022-05-03: qty 200

## 2022-05-03 MED ORDER — CHLORHEXIDINE GLUCONATE CLOTH 2 % EX PADS
6.0000 | MEDICATED_PAD | Freq: Every day | CUTANEOUS | Status: DC
  Administered 2022-05-03: 6 via TOPICAL

## 2022-05-03 MED ORDER — FUROSEMIDE 10 MG/ML IJ SOLN: 60.0000 mg | Freq: Once | INTRAMUSCULAR | Status: DC

## 2022-05-03 MED ORDER — SODIUM CHLORIDE 0.9 % IV SOLN: 250.0000 mL | INTRAVENOUS | Status: DC

## 2022-05-03 MED ORDER — NOREPINEPHRINE 4 MG/250ML-% IV SOLN
INTRAVENOUS | Status: AC
  Filled 2022-05-03: qty 250

## 2022-05-03 MED ORDER — PRAVASTATIN SODIUM 40 MG PO TABS
40.0000 mg | ORAL_TABLET | Freq: Every day | ORAL | Status: DC
  Administered 2022-05-03: 40 mg via ORAL
  Filled 2022-05-03: qty 1

## 2022-05-03 MED ORDER — LEVETIRACETAM IN NACL 1000 MG/100ML IV SOLN
1000.0000 mg | Freq: Once | INTRAVENOUS | Status: AC
  Administered 2022-05-03: 1000 mg via INTRAVENOUS
  Filled 2022-05-03: qty 100

## 2022-05-03 MED ORDER — PHENYLEPHRINE HCL-NACL 20-0.9 MG/250ML-% IV SOLN: 25.0000 ug/min | INTRAVENOUS | Status: DC

## 2022-05-03 MED ORDER — AMIODARONE HCL IN DEXTROSE 360-4.14 MG/200ML-% IV SOLN
30.0000 mg/h | INTRAVENOUS | Status: DC
  Administered 2022-05-03: 30 mg/h via INTRAVENOUS
  Filled 2022-05-03 (×3): qty 200

## 2022-05-03 MED ORDER — POLYETHYLENE GLYCOL 3350 17 G PO PACK
17.0000 g | PACK | Freq: Every day | ORAL | Status: DC | PRN
  Administered 2022-05-03: 17 g via ORAL
  Filled 2022-05-03: qty 1

## 2022-05-03 MED ORDER — SODIUM CHLORIDE 0.9% IV SOLUTION: Freq: Once | INTRAVENOUS | Status: DC

## 2022-05-03 MED ORDER — HEPARIN SODIUM (PORCINE) 5000 UNIT/ML IJ SOLN
5000.0000 [IU] | Freq: Three times a day (TID) | INTRAMUSCULAR | Status: DC
Start: 2022-05-03 — End: 2022-05-04
  Administered 2022-05-03: 5000 [IU] via SUBCUTANEOUS
  Filled 2022-05-03 (×2): qty 1

## 2022-05-03 NOTE — Progress Notes (Signed)
Family medicine teaching service will be admitting this patient. Our pager information can be located in the physician sticky notes, treatment team sticky notes, and the headers of all our official daily progress notes.   FAMILY MEDICINE TEACHING SERVICE Patient - Please contact intern pager [phone] or text page via website [email or web address] (login: [email or web address]) for questions regarding care. DO NOT page listed attending provider unless there is no answer from the number above.   Jesse Eichhorst, MD PGY-3,  Family Medicine Service pager [phone]   

## 2022-05-03 NOTE — ED Triage Notes (Addendum)
Pt BIB [facility] from [facility] after unwitnessed fall in bathroom. Staff heard fall and found pt actively seizing which lasted appx 1 min. 2 other seizures occurred afterwards. Abrasion to R temple, R elbow, and bilateral knees. C-collar on. Pt now A&Ox4. VSS HX- COPD, CHF

## 2022-05-03 NOTE — Plan of Care (Signed)
Called by Dr. Roxanne Mins regarding this patient-possible seizure-like activity in the setting of AICD firing.  Getting cardiology consultation.  I recommended getting an EEG to ensure no electrographic abnormalities.  If EEG shows any electrographic abnormalities, we would be happy to see him in phone consultation.  EEG was completed.  Only shows continuous generalized slowing-no focality.  Do not see a need for inpatient neurological consultation.  No need for AEDs. Please feel free to reach out to inpatient neurology with further questions as needed.  -- Amie Portland, MD Neurologist [facility] Pager: [phone]

## 2022-05-03 NOTE — Assessment & Plan Note (Signed)
History of MI status post stenting.  Last cath 03/2016, Cardiology following, will see if another cath is warranted

## 2022-05-03 NOTE — Significant Event (Signed)
Late entry of significant event.  Received page from ED RN regarding reported seizure-like activity while patient was in x-ray.  ED RN noted patient was back to his ED room and appeared to be postictal.  Prior to leaving office for the patient's bedside, both EP and neurology were notified of the event.  Requested EP to interrogate pacemaker.  Upon arriving to bedside, MD found his daughter [name] and his caregiver at the bedside, along with EP PA Tillery.  Patient himself was responsive but appeared groggy and dysarthric.  No acute distress noted.  Daughter [name] confirms patient's current groggy and dysarthric state is a drastic change from his baseline.  She reports that normally he is quite articulate and he can understand everything he says.  EP PA Tillery reported the interrogation revealed episode of V. tach with subsequent ICD firing that corresponds to the time of episode in x-ray.  He reports cardiology plans to start lidocaine drip.  Soon after, neurology Dr. Rory Percy came to bedside.  Case discussed at nurses station.  He believes more cardiogenic, does not plan on long-term EEG at this time.  CCM consulted for ICU admission, given that progressive unit will not take anyone on a lidocaine drip.  Verbal signout given at bedside  Family medicine service will happily take over patient care once he has been transferred out of the ICU.  Jesse Essex, MD

## 2022-05-03 NOTE — Assessment & Plan Note (Signed)
Home medication: Midodrine 2.5 mg 3 times daily -Hold home medication until meds verified

## 2022-05-03 NOTE — Assessment & Plan Note (Signed)
Currently on room air with O2 sat 98%. Home O2 1 to 2 L at night.  Home medication of budesonide .'5mg'$  twice daily, Yupelri 175 mcg daily, formoterol 20 mcg twice daily - Goal O2 80 to 92% - Pulmonary toilet

## 2022-05-03 NOTE — ED Provider Notes (Addendum)
  Physical Exam  BP 115/64   Pulse 70   Temp (!) 96.9 F (36.1 C) (Axillary)   Resp 19   SpO2 98%   Physical Exam  Procedures  Procedures  ED Course / MDM    Medical Decision Making Amount and/or Complexity of Data Reviewed Labs: ordered. Radiology: ordered.  Risk Prescription drug management.   88 y/o male with possible seizure episodes.  On trauma work-up patient found a nondisplaced TP fracture of C4, neurosurgery states no intervention needed.  Regarding seizures, patient given load of Keppra, case discussed with neurology who recommended checking EEG, if normal, no ongoing antiepileptics necessary.  ICD prelim report reporting multiple episodes of VT and subsequent firing.  Case discussed with cardiology who will consult on patient and provide further guidance.  While awaiting recommendations from cardiology, received signout from Dr. Roxanne Mins.  I will follow-up on recs from cardiology, reassess patient.    ADDENDUM 8:53 AM received update from to [facility] with cardiology EP, he states the EP team will be seeing patient shortly, he requests that patient be admitted to medicine.  Further recs from EP pending their formal eval.  Will consult [facility] for admit.  9:00 AM discussed with Dr. Tamala Julian with [facility] - he states that patient's primary care doctor is no longer with [place] and patient should be unassigned, will page unassigned medicine for admit  9:32 AM discussed with family medicine resident team, they will accept   Lucrezia Starch, MD 05/21/2022 [phone]

## 2022-05-03 NOTE — Procedures (Signed)
Intubation Procedure Note  [name]  [id number]  [birth year]  Date:05/21/2022  Time:11:14 PM   Provider Performing:Torra Pala R Glenroy Crossen    Procedure: Intubation (31500)  Indication(s) Respiratory Failure  Consent Unable to obtain consent due to emergent nature of procedure.   Anesthesia No meds given. Pt intubated by RT during Code Blue.   Time Out Verified patient identification, verified procedure, site/side was marked, verified correct patient position, special equipment/implants available, medications/allergies/relevant history reviewed, required imaging and test results available.   Sterile Technique Usual hand hygeine, masks, and gloves were used   Procedure Description Patient positioned in bed supine.  Sedation given as noted above.  Patient was intubated with endotracheal tube using  Mac 3 direct view .  View was Grade 1 full glottis .  Number of attempts was 1.  Colorimetric CO2 detector was consistent with tracheal placement.   Complications/Tolerance None; patient tolerated the procedure well. Chest X-ray is ordered to verify placement.   EBL Minimal   Specimen(s) None

## 2022-05-03 NOTE — Assessment & Plan Note (Signed)
VT defribillator interrogated with 5 runs V tach requiring shocks. Was given amiodarone IV bolus cautiously given history of hypotension.  They will consider adjusting ICD therapies based on goals of care -Cardiology and EP following, appreciate recommendations

## 2022-05-03 NOTE — Assessment & Plan Note (Signed)
Hx CKD 3. Cr 1.70, GFR 38.  1 year ago Cr was 1.18 though new baseline around 1.3.  Likely pre- renal versus cardiorenal in etiology given his cardiac history.  Does have a history of urinary retention, will need to monitor for signs of retention.  States he has not been able to use the restroom much in the past couple of days. - Continue to monitor with a.m. BMP - Avoid nephrotoxic agents - Monitor urinary output

## 2022-05-03 NOTE — H&P (Signed)
Hospital Admission History and Physical Service Pager: [phone]  Patient name: Charleton Deyoung Medical record number: [id number] Date of Birth: 12-Mar-1931 Age: 88 y.o. Gender: male  Primary Care Provider: Caren Macadam, MD (Inactive) Consultants: EP, cardiology, neurology, palliative care Code Status: Full Preferred Emergency Contact:   Contact Information     Name Relation Home Work [place] Daughter   [phone]   Darlina Sicilian Daughter [phone]        Chief Complaint: Fall   Assessment and Plan: Gid Schoffstall is a 88 y.o. male presenting after a fall. Fall initially thought to be due to seizures but EEG normal so less likely. Fall most likely 2/2 to  ventricular tachycardia given evidence of VT on ICD and shocks given.  PMH of combined systolic and diastolic heart failure, CAD s/p stent, AAA s/p repair, Hx LGIB, HTN, HLD, CKD3, Osteopenia, COPD, hypothyroidism,Hx hip fracture in April, history of tobacco dependence  * Fall Likely secondary to V. tach.  Less likely due to seizures with normal EEG. - Admit to FPTS cardiac telemetry, attending Dr. Owens Shark - neurology following, appreciate recommendations - Cardiology and EP following appreciate recommendations - Given increased pain of right extremity after fall will get right hip, knee, and foot x-ray - Swallow eval prior to diet - Tylenol 650 mg every 6 as needed - Voltaren gel 4 times daily as needed - Strict I's and O's - PT OT eval  C4 cervical fracture ([place]) CT head with non displaced fracture of C4 transverse process.  Per neurosurgery no further treatment needed.   Anemia Hgb 8.4 likely anemia of chronic disease though cannot rule out GI bleed as patient has history of LGIB.  Transfusion threshold 8.  -Continue to monitor with a.m. CBC  Goals of care, counseling/discussion She has DNR form bedside, but when discussed with patient he would like full  resuscitation.  He was not sure when or why that form was filled out.  When discussing with daughter she also stated she had not ever had that conversation with her dad, but that she will talk with the sister about it.  Discussed about palliative consult, and she expressed her hesitation because her mom passed shortly after talking to palliative though she knows it was not because of that discussion but it just brings back bad memories.  We will continue to have ongoing conversations.  AKI (acute kidney injury) (HCC) Hx CKD 3. Cr 1.70, GFR 38.  1 year ago Cr was 1.18 though new baseline around 1.3.  Likely pre- renal versus cardiorenal in etiology given his cardiac history.  Does have a history of urinary retention, will need to monitor for signs of retention.  States he has not been able to use the restroom much in the past couple of days. - Continue to monitor with a.m. BMP - Avoid nephrotoxic agents - Monitor urinary output  Age-related cognitive decline Daughter states at nursing facility has been experiencing increased agitation and was given a dose of Ativan. Today he is A&O x3 and daughter states he seems more out of it than when she saw him yesterday. - Continue to monitor mental status - Avoid sedating medication  V tach ([place]) VT defribillator interrogated with 5 runs V tach requiring shocks. Was given amiodarone IV bolus cautiously given history of hypotension.  They will consider adjusting ICD therapies based on goals of care -Cardiology and EP following, appreciate recommendations  CAD S/P percutaneous coronary angioplasty History  of MI status post stenting.  Last cath 03/2016, Cardiology following, will see if another cath is warranted   Orthostatic hypotension Home medication: Midodrine 2.5 mg 3 times daily -Hold home medication until meds verified   Hypothyroidism - Continue home Synthroid once meds verified  S/P ORIF (open reduction internal fixation) fracture From fall in  April of this year.S/p ORIF. Daughter states since this fall there has been a fairly significant decline.  She does state that his right hip is hurting but was not prior to fall, which I suspect has worsened from this current fall.  Takes hydrocodone acetaminophen 5-325 mg every 8 hours as needed for severe pain  Acute on chronic combined systolic and diastolic congestive heart failure ([place]) Echo in 2021 with EF 30-35% GIDD. Home medication: Lasix '20mg'$ . Was given IV lasix '60mg'$  x2 - Cardiology following, appreciate recommendations - Echo - Strict I's and O's  COPD GOLD III Currently on room air with O2 sat 98%. Home O2 1 to 2 L at night.  Home medication of budesonide .'5mg'$  twice daily, Yupelri 175 mcg daily, formoterol 20 mcg twice daily - Goal O2 80 to 92% - Pulmonary toilet    FEN/GI: npo until speech eval  VTE Prophylaxis: SCD given high risk of bleeding  Disposition: Pending further work up   History of Present Illness:  Aedyn Kempfer is a 88 y.o. male presenting after a fall and possible seizure episodes.  [facility] called daughter at 4am states he was in the bathroom fell off the toilet and fell. Thought he had seized. Daughter was with him yesterday evening felt strong urge to urinate but wasn't able to. About 3 months ago after prostate problem he fell while tyring ot go ot the bathroom in the middle of the night and broke his hip. Went to [facility] for rehab but couldn't do rehab because orthostatic hypotension. In the last week right ankle started to swell, daughter put compression stockings on. In the last week he also complained of difficulty breathing. Had a bad night about 3 nights ago and had Ativan which calmed him down. Daughter reports he has good days and bad days. Sometimes with more energy than other days.   Patient states he just remembers he had to go to the bathroom but couldn't. Does not remember ICD firing   Daughter states that she will have her husband get a  medication list from the skilled nursing facility and bring that in.  In ED CT head without acute abnormalities. CT cervical spine with C4 non displaced fracture.  Surgery consulted in the ED and did not recommend any further treatment, and did not recommend CT collar.  Labs with mild acidosis, lactic acid elevated mild transaminases, mildly elevated mag, low CK, akathetic anemia.  Allergy consulted who performed EEG.  Cardiology and EP started on amiodarone. Was given IV lasix '60mg'$  x2  Review Of Systems: Per HPI  Pertinent Past Medical History: See problem list above  Remainder reviewed in history tab.   Pertinent Past Surgical History: Remainder reviewed in history tab.   Pertinent Social History: Tobacco use: Former  Lives in SNF  Pertinent Family History: Non contributory   Remainder reviewed in history tab.   Important Outpatient Medications: Awaiting medication list from living facility. Remainder reviewed in medication history.   Objective: BP 100/79   Pulse (!) 55   Temp (!) 96.9 F (36.1 C) (Axillary)   Resp 18   SpO2 97%  Exam: General: Chronically ill appearing male  lying in bed, NAD Eyes: PERRL. EOMI ENTM: Laceration with dried blood over L temporal area. MMM Neck: Supple. JVD appreciated Cardiovascular: RRR with 2/6 systolic murmur Respiratory: Diffuse crackles. Normal WOB on RA  Gastrointestinal: soft, non distended. No TTP MSK: moves extremities spontaneously  Derm: warm, dry.laceration on R temporal area, and L shin.  Neuro: And oriented x3, not oriented to time. CN in tact. Normal sensation  Psych: Mood and affect appropriate  Labs:  CBC BMET  Recent Labs  Lab 04/25/2022 0508  WBC 9.7  HGB 8.4*  HCT 26.7*  PLT 105*   Recent Labs  Lab 05/06/2022 0508  NA 131*  K 4.3  CL 96*  CO2 17*  BUN 46*  CREATININE 1.70*  GLUCOSE 217*  CALCIUM 8.5*     EKG: SR. Ventricular premature complexes. Prolonged PR interval    Imaging Studies  Performed:   EEG adult: moderate diffuse encephalopathy, nonspecific to etiology.  No seizures or epileptiform discharges were seen throughout the recording.   CT Cervical Spine Wo Contrast:Osteopenia, with a subtle acute minimally displaced fracture of the Left C4 transverse process. 2. No other acute traumatic injury identified in the cervical spine. 3. 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   CT Head Wo Contrast:Stable since April. No acute intracranial abnormality or acute traumatic injury identified.   Shary Key, DO 05/06/2022, 12:27 PM PGY-3, [place] Intern pager: [phone], text pages welcome Secure chat group [place]

## 2022-05-03 NOTE — Consult Note (Signed)
Neurology Consultation  Reason for Consult: Seizure Referring Physician: Dr. Owens Shark, teaching service  CC: Seizures  History is obtained from: Chart review, patient  HPI: [name] is a 88 y.o. male past medical history of ischemic heart disease, chronic systolic CHF, syncope, mild aortic stenosis, ICD placement, AAA carotid artery disease with right CEA in 2012, COPD, hypertension, presented to the emergency room from a facility with reports of unwitnessed fall and total of 3 seizures.  Patient says he fell off the stool and does not remember anything else.  No bowel bladder incontinence.  No lip or tongue bite.  No prior history of seizures.  Spot EEG with diffuse encephalopathy and no focal findings.  Seizure-like episodes happen with the firing of AICD making a hypoperfusion like etiology more likely was the going diagnosis but he had another episode while in the ED getting x-ray while he was off of telemetry.  ICD interrogation at that time noted V. tach with 3 bursts that ultimately slowed and had 1 shock. Episode was about 30 to 40 seconds per nursing report.  He was somewhat confused for a minute or 2 after that and then came back around.  Again no bowel bladder incontinence or tongue bite.  Neurology was consulted for input on these events.  Patient has not had prior history of seizures.  No history of head trauma.  He did have a fall this morning-unclear if he hit his head.  ROS: Full ROS was performed and is negative except as noted in the HPI.   Past Medical History:  Diagnosis Date   AAA (abdominal aortic aneurysm) ([place])    a. 2002 s/p repair.   AICD (automatic cardioverter/defibrillator) present    Allergic rhinitis    Back pain    CAD (coronary artery disease)    a. 02/2002 H/o MI with stenting x 2; b. 04/2013 MV: EF 25%, large posterior lateral infarct w/o ischemia; c. 03/2016 VT Arrest/Cath: LM 60ost, LAD 40p/m ISR, LCX 100p/m, RCA nl-->Med Rx.   Carotid arterial  disease ([place])    a. 12/2010 s/p R CEA;  b. 05/2015 Carotid U/S: bilat <40% ICA stenosis.   Chronic combined systolic and diastolic CHF (congestive heart failure) ([name])    a. 03/2016 Echo: Ef 35-40%, grade 1 DD.   Compression fracture    COPD (chronic obstructive pulmonary disease) ([facility])    Dermatitis    Diverticulosis of colon    DJD (degenerative joint disease)    and Gout   Hemorrhoids    History of colonic polyps    History of gout    History of pneumonia    Hypercholesterolemia    Hypertension    Hypertensive heart disease    Ischemic cardiomyopathy    a. EF prev <35%-->improved to normal by Echo 8/14:  Mild LVH, focal basal hypertrophy, EF 60-65%, normal wall motion, mild BAE, PASP 36; c. 03/2016 Echo: EF 35-40%, Gr1 DD, triv AI, mild MR, mod dil LA, mild-mod TR, PASP 30mHg.   Osteoporosis    Peripheral vascular disease ([place]    Presence of cardiac defibrillator    a. 03/2008 s/p MDT [id number] Maximo II DR, DC AICD; b. 03/2013: ICD shock for T wave oversensing;  c. 10/2015: collective decision not to replace ICD given improvement in LV fxn; d. VT Arrest-->Gen change to MDT ser # [id number].   Presence of permanent cardiac pacemaker    Skin cancer    shoulders and forehead   Syncope    T wave  over sensing resulting in inappropriate shocks    a. 03/2013.   Tobacco abuse    Ventricular tachycardia ([place]) 04/01/2016    Family History  Problem Relation Age of Onset   Hypertension Father    Heart disease Father 59       Heart Disease before age 71   Alcohol abuse Father        causing multiple medical problems; uncertain exact cause of death   Hypertension Mother    Heart disease Mother        Heart Disease before age 104   Cancer Mother        pancreatic   Scoliosis Sister    Thyroid disease Daughter    Anxiety disorder Son      Social History:   reports that he quit smoking about 14 years ago. His smoking use included cigarettes, cigars, and pipe. He has a 97.50 pack-year  smoking history. He has never used smokeless tobacco. He reports current alcohol use of about 15.0 standard drinks of alcohol per week. He reports that he does not use drugs.  Medications  Current Facility-Administered Medications:    acetaminophen (TYLENOL) tablet 650 mg, 650 mg, Oral, Q6H PRN, Paige, Victoria J, DO   [COMPLETED] amiodarone (NEXTERONE) 1.8 mg/mL load via infusion 150 mg, 150 mg, Intravenous, Once, 150 mg at 05/02/2022 0919 **FOLLOWED BY** amiodarone (NEXTERONE PREMIX) 360-4.14 MG/200ML-% (1.8 mg/mL) IV infusion, 60 mg/hr, Intravenous, Continuous, Last Rate: 33.3 mL/hr at 05/17/2022 0919, 60 mg/hr at 04/26/2022 0919 **FOLLOWED BY** amiodarone (NEXTERONE PREMIX) 360-4.14 MG/200ML-% (1.8 mg/mL) IV infusion, 30 mg/hr, Intravenous, Continuous, Paige, Victoria J, DO   diclofenac Sodium (VOLTAREN) 1 % topical gel 2 g, 2 g, Topical, QID, Paige, Victoria J, DO   lidocaine (cardiac) 2000 mg in dextrose 5% 500 mL ('4mg'$ /mL) IV infusion, 1 mg/min, Intravenous, Continuous, Shirley Friar, PA-C, Last Rate: 15 mL/hr at 05/09/2022 1401, 1 mg/min at 04/30/2022 1401  Current Outpatient Medications:    allopurinol (ZYLOPRIM) 100 MG tablet, TAKE 1 TABLET ONCE DAILY. (Patient taking differently: Take 100 mg by mouth daily.), Disp: 30 tablet, Rfl: 10   amiodarone (PACERONE) 100 MG tablet, Take 100 mg by mouth See admin instructions. Give 1 tablet by mouth one time a day every Mon, Tue, Wed, Thu, Fri for abnormal heart rhythm., Disp: , Rfl:    budesonide (PULMICORT) 0.5 MG/2ML nebulizer solution, Take 2 mLs (0.5 mg total) by nebulization 2 (two) times daily., Disp: 120 mL, Rfl: 0   Cholecalciferol (VITAMIN D) 125 MCG (5000 UT) CAPS, Take 1 capsule by mouth daily., Disp: 30 capsule, Rfl: 6   collagenase (SANTYL) 250 UNIT/GM ointment, Apply 1 Application topically daily. Apply to lower back wound topically every day shift for wound healing. Cleanse with NS, pat dry, apply santyl, calcium alginate and cover with  ABD pad secure with tape daily., Disp: , Rfl:    docusate sodium (COLACE) 100 MG capsule, Take 1 capsule (100 mg total) by mouth 2 (two) times daily., Disp: 10 capsule, Rfl: 0   fluticasone (CUTIVATE) 0.05 % cream, Apply 1 application. topically 2 (two) times daily as needed (pain)., Disp: , Rfl:    formoterol (PERFOROMIST) 20 MCG/2ML nebulizer solution, Take 2 mLs (20 mcg total) by nebulization 2 (two) times daily., Disp: 120 mL, Rfl: 5   furosemide (LASIX) 20 MG tablet, TAKE 1 TABLET ONCE DAILY. (Patient taking differently: Take 20 mg by mouth daily.), Disp: 90 tablet, Rfl: 3   guaiFENesin (MUCINEX) 600 MG 12 hr tablet,  Take 600 mg by mouth in the morning, at noon, in the evening, and at bedtime. , Disp: , Rfl:    HYDROcodone-acetaminophen (NORCO/VICODIN) 5-325 MG tablet, Take 1 tablet by mouth every 8 (eight) hours as needed for severe pain., Disp: 10 tablet, Rfl: 0   Infant Care Products (DERMACLOUD) OINT, Apply 1 Application topically daily. Apply to buttocks topically every shift for protection., Disp: , Rfl:    levothyroxine (SYNTHROID) 125 MCG tablet, TAKE ONE TABLET BY MOUTH ONCE DAILY (Patient taking differently: Take 125 mcg by mouth daily before breakfast.), Disp: 90 tablet, Rfl: 1   LORazepam (ATIVAN) 0.5 MG tablet, Take 0.5 mg by mouth daily as needed for anxiety., Disp: , Rfl:    LORazepam (ATIVAN) 2 MG/ML injection, Inject 0.5 mLs into the muscle once., Disp: , Rfl:    magnesium oxide (MAG-OX) 400 (241.3 Mg) MG tablet, Take 0.5 tablets (200 mg total) by mouth daily., Disp: 30 tablet, Rfl: 0   midodrine (PROAMATINE) 10 MG tablet, Take 10 mg by mouth 3 (three) times daily., Disp: , Rfl:    mirtazapine (REMERON) 7.5 MG tablet, Take 7.5 mg by mouth at bedtime., Disp: , Rfl:    Multiple Vitamin (MULTIVITAMIN ADULT PO), Take 1 tablet by mouth daily., Disp: , Rfl:    OXYGEN, Inhale 2-4 L into the lungs as needed (for SATS <92% every shift)., Disp: , Rfl:    pantoprazole (PROTONIX) 40 MG  tablet, Take 40 mg by mouth daily., Disp: , Rfl:    Pollen Extracts (PROSTAT PO), Take 30 mg by mouth 2 (two) times daily., Disp: , Rfl:    polyethylene glycol (MIRALAX / GLYCOLAX) 17 g packet, Take 17 g by mouth daily as needed for mild constipation., Disp: , Rfl:    pravastatin (PRAVACHOL) 40 MG tablet, TAKE 1 TABLET ONCE DAILY. (Patient taking differently: Take 40 mg by mouth at bedtime.), Disp: 90 tablet, Rfl: 0   revefenacin (YUPELRI) 175 MCG/3ML nebulizer solution, Take 3 mLs (175 mcg total) by nebulization daily., Disp: 270 mL, Rfl: 3   tamsulosin (FLOMAX) 0.4 MG CAPS capsule, Take 0.4 mg by mouth at bedtime., Disp: , Rfl:    thiamine 100 MG tablet, Take 1 tablet (100 mg total) by mouth daily., Disp: 30 tablet, Rfl: 0   triamcinolone cream (KENALOG) 0.1 %, Apply 1 application. topically 2 (two) times daily as needed (irritation)., Disp: , Rfl:    amiodarone (PACERONE) 200 MG tablet, TAKE 1 TABLET ('200MG'$ ) BY MOUTH 5 DAYS PER WEEK. (Patient not taking: Reported on 04/21/2022), Disp: 90 tablet, Rfl: 3   [START ON 05/05/2022] aspirin EC 81 MG tablet, Take 81 mg by mouth daily., Disp: , Rfl:    enoxaparin (LOVENOX) 40 MG/0.4ML injection, Inject 0.4 mLs (40 mg total) into the skin daily. (Patient not taking: Reported on 04/29/2022), Disp: 0 mL, Rfl:    feeding supplement (ENSURE ENLIVE / ENSURE PLUS) LIQD, Take 237 mLs by mouth 3 (three) times daily between meals. (Patient not taking: Reported on 04/27/2022), Disp: 237 mL, Rfl: 12   midodrine (PROAMATINE) 2.5 MG tablet, TAKE 1 TABLET 3 TIMES DAILY WITH MEALS. (Patient not taking: Reported on 04/24/2022), Disp: 270 tablet, Rfl: 3   oxybutynin (DITROPAN) 5 MG tablet, TAKE ONE TABLET BY MOUTH TWICE DAILY (Patient not taking: Reported on 05/02/2022), Disp: 180 tablet, Rfl: 0   sertraline (ZOLOFT) 50 MG tablet, TAKE ONE TABLET AT BEDTIME (Patient not taking: Reported on 05/21/2022), Disp: 90 tablet, Rfl: 0  Exam: Current vital signs: BP 100/79  Pulse (!) 55    Temp (!) 96.9 F (36.1 C) (Axillary)   Resp 18   SpO2 97%  Vital signs in last 24 hours: Temp:  [96.9 F (36.1 C)] 96.9 F (36.1 C) (07/13 0438) Pulse Rate:  [26-84] 55 (07/13 1145) Resp:  [14-27] 18 (07/13 1145) BP: (97-155)/(62-137) 100/79 (07/13 1145) SpO2:  [92 %-100 %] 97 % (07/13 1145) General: Awake alert in no distress HEENT: Cephalic atraumatic Lungs: Clear Cardiovascular: Regular rhythm Abdomen nondistended nontender Extremities with dependent edema bilaterally Neurological exam Is awake alert oriented x3 Speech is mildly dysarthric but the caretaker at bedside says that that is his baseline. No aphasia Diminished attention concentration Cranial nerves II to XII intact Motor examination with bilateral upper extremity strength antigravity without drift.  Right lower extremity difficult to examine due to extreme pain from a prior fracture.  Left upper extremity is antigravity. Sensation intact light touch No gross dysmetria on coordination exam.  Labs I have reviewed labs in epic and the results pertinent to this consultation are: CBC    Component Value Date/Time   WBC 9.7 05/21/2022 0508   RBC 2.64 (L) 05/10/2022 0508   HGB 8.4 (L) 05/11/2022 0508   HGB 16.7 12/29/2021 1547   HCT 26.7 (L) 05/15/2022 0508   HCT 48.8 12/29/2021 1547   PLT 105 (L) 05/02/2022 0508   PLT 128 (L) 12/29/2021 1547   MCV 101.1 (H) 05/15/2022 0508   MCV 99 (H) 12/29/2021 1547   MCH 31.8 05/17/2022 0508   MCHC 31.5 05/21/2022 0508   RDW 15.3 05/02/2022 0508   RDW 12.8 12/29/2021 1547   LYMPHSABS 1.0 04/29/2022 0508   LYMPHSABS 2.1 12/29/2021 1547   MONOABS 0.4 05/05/2022 0508   EOSABS 0.2 05/05/2022 0508   EOSABS 2.9 (H) 12/29/2021 1547   BASOSABS 0.0 05/02/2022 0508   BASOSABS 0.1 12/29/2021 1547    CMP     Component Value Date/Time   NA 131 (L) 05/02/2022 0508   NA 138 12/29/2021 1547   K 4.3 05/18/2022 0508   CL 96 (L) 05/02/2022 0508   CO2 17 (L) 04/21/2022 0508    GLUCOSE 217 (H) 05/20/2022 0508   GLUCOSE 93 11/01/2006 0750   BUN 46 (H) 05/18/2022 0508   BUN 21 12/29/2021 1547   CREATININE 1.70 (H) 05/16/2022 0508   CREATININE 1.47 (H) 06/20/2021 1442   CALCIUM 8.5 (L) 05/09/2022 0508   PROT 6.0 (L) 05/01/2022 0508   PROT 8.0 12/29/2021 1547   ALBUMIN 2.8 (L) 05/11/2022 0508   ALBUMIN 4.7 (H) 12/29/2021 1547   AST 46 (H) 04/27/2022 0508   ALT 45 (H) 05/13/2022 0508   ALKPHOS 112 05/10/2022 0508   BILITOT 0.4 05/10/2022 0508   BILITOT 0.5 12/29/2021 1547   GFRNONAA 38 (L) 05/21/2022 0508   GFRNONAA 52 (L) 09/20/2020 1619   GFRAA 61 09/20/2020 1619    Lipid Panel     Component Value Date/Time   CHOL 186 12/29/2021 1547   TRIG 293 (H) 12/29/2021 1547   TRIG 163 (H) 11/01/2006 0750   HDL 38 (L) 12/29/2021 1547   CHOLHDL 4.9 12/29/2021 1547   CHOLHDL 5 11/16/2020 1440   VLDL 63.0 (H) 11/16/2020 1440   LDLCALC 98 12/29/2021 1547   LDLDIRECT 97.0 11/16/2020 1440     Imaging I have reviewed the images obtained: CT-head-no acute changes  Neurodiagnostics Electroencephalogram: Continuous slow, generalized, no seizures or epileptiform discharges.  Assessment:  88 year old with a past medical history listed above presenting after a  fall likely related to AICD firing for V. tach and concern for concurrent seizure-like episodes.  I agree with Dr. Curt Bears and the cardiology team that the episodes have all happened in conjunction with when he has been in V. tach and the AICD has fired making a cerebral hypoperfusion event in the setting of cardiac arrhythmia more likely than a seizure since he has no history of prior seizures and really the semiology has not fit very well with the seizure.  Neurological exam is rather benign. Brain imaging is unremarkable. EEG was done that showed generalized slowing with no focal findings.  Impression: Seizure-like episodes-likely related to cerebral hypoperfusion in the setting of cardiac  arrhythmias  Recommendations: No need for AEDs. I would still maintain seizure precautions because clinically what ever happened did appear like seizure multiple times. Correction of the underlying arrhythmias as is being done by cardiology. My initial thought was to put him on LTM EEG but after discussion with cardiology and the correlation of these neurological events with a cardiac event makes a cardiac etiology more likely hence I will not pursue LTM EEG route at this time. Discussed with the cardiology team and the family medicine team over secure chat. Inpatient neurology will be available as needed. Please do not hesitate to call us again with questions  -- Amie Portland, MD Neurologist [facility] Pager: [phone]

## 2022-05-03 NOTE — Progress Notes (Signed)
~  1900: Patient had multiple nonsustained runs of Vtach lasting 20-30 seconds. Patient would become hypotensive during episodes. Elink contacted and then RN referred to cardiology for orders. Patient's Amiodarone increased to '60mg'$ , and a '150mg'$  bolus given.   ~2000: Patient continued to have runs of Vtach with hypotension and Cardiology paged. Defer to [facility] for hypotension.  ~2030: Elink placed orders for Neo gtt if needed and lasix once d/t no UOP once patient's blood pressure becomes stable.  Patient continues to have runs of Tanna Furry-- MD is aware. Currently on Amino '60mg'$  gtt & Lido '1mg'$ /min. Received 1 unit of PRBC.  Daughter at bedside.

## 2022-05-03 NOTE — ED Provider Notes (Signed)
[facility] EMERGENCY DEPARTMENT Provider Note   CSN: [id number] Arrival date & time: 05/02/2022  0429     History  Chief Complaint  Patient presents with   Fall   Seizures    Nashua Homewood is a 88 y.o. male.  The history is provided by the patient.  Fall  Seizures He has history of hypertension, hyperlipidemia, coronary artery disease, peripheral vascular disease, combined systolic and diastolic heart failure, status post implanted defibrillator and was sent here from a skilled nursing facility where he is reported to have had an unwitnessed fall and then noted to have a total of 3 seizures.  Patient states that he fell off of a stool and does not remember anything else.  He denies bowel or bladder incontinence and denies pit lip or tongue.  There is no prior history of seizures.  He was placed in a stiff cervical collar by EMS and transported here.   Home Medications Prior to Admission medications   Medication Sig Start Date End Date Taking? Authorizing Provider  allopurinol (ZYLOPRIM) 100 MG tablet TAKE 1 TABLET ONCE DAILY. Patient taking differently: Take 100 mg by mouth daily. 07/18/21   Minus Breeding, MD  amiodarone (PACERONE) 200 MG tablet TAKE 1 TABLET ('200MG'$ ) BY MOUTH 5 DAYS PER WEEK. Patient taking differently: Take 200 mg by mouth every Monday, Tuesday, Wednesday, Thursday, and Friday. 09/12/21   Deboraha Sprang, MD  aspirin EC 81 MG tablet Take 81 mg by mouth daily.    [provider]  budesonide (PULMICORT) 0.5 MG/2ML nebulizer solution Take 2 mLs (0.5 mg total) by nebulization 2 (two) times daily. 01/27/19   Garner Nash, DO  Cholecalciferol (VITAMIN D) 125 MCG (5000 UT) CAPS Take 1 capsule by mouth daily. 02/15/22   Ainsley Spinner, PA-C  docusate sodium (COLACE) 100 MG capsule Take 1 capsule (100 mg total) by mouth 2 (two) times daily. 02/15/22   Ainsley Spinner, PA-C  enoxaparin (LOVENOX) 40 MG/0.4ML injection Inject 0.4 mLs (40 mg total)  into the skin daily. 02/16/22   Thurnell Lose, MD  feeding supplement (ENSURE ENLIVE / ENSURE PLUS) LIQD Take 237 mLs by mouth 3 (three) times daily between meals. 02/15/22   Ainsley Spinner, PA-C  fluticasone (CUTIVATE) 0.05 % cream Apply 1 application. topically 2 (two) times daily as needed (pain). 12/27/21   [provider]  formoterol (PERFOROMIST) 20 MCG/2ML nebulizer solution Take 2 mLs (20 mcg total) by nebulization 2 (two) times daily. 12/10/18   Icard, Octavio Graves, DO  furosemide (LASIX) 20 MG tablet TAKE 1 TABLET ONCE DAILY. Patient taking differently: Take 20 mg by mouth daily. 08/22/21   Deboraha Sprang, MD  guaiFENesin (MUCINEX) 600 MG 12 hr tablet Take 600 mg by mouth in the morning, at noon, in the evening, and at bedtime.     [provider]  HYDROcodone-acetaminophen (NORCO/VICODIN) 5-325 MG tablet Take 1 tablet by mouth every 8 (eight) hours as needed for severe pain. 02/16/22   Thurnell Lose, MD  levothyroxine (SYNTHROID) 125 MCG tablet TAKE ONE TABLET BY MOUTH ONCE DAILY Patient taking differently: Take 125 mcg by mouth daily before breakfast. 01/22/22   Koberlein, Steele Berg, MD  magnesium oxide (MAG-OX) 400 (241.3 Mg) MG tablet Take 0.5 tablets (200 mg total) by mouth daily. 04/27/16   Love, Ivan Anchors, PA-C  midodrine (PROAMATINE) 2.5 MG tablet TAKE 1 TABLET 3 TIMES DAILY WITH MEALS. Patient taking differently: Take 2.5 mg by mouth 3 (three) times  daily with meals. 12/04/21   Deboraha Sprang, MD  Multiple Vitamin (MULTIVITAMIN ADULT PO) Take 1 capsule by mouth daily.    [provider]  mupirocin ointment (BACTROBAN) 2 % Apply 1 application. topically as needed (eczema). 04/18/21   [provider]  nystatin (MYCOSTATIN) 100000 UNIT/ML suspension Take 100,000 Units by mouth in the morning, at noon, in the evening, and at bedtime. 02/21/21   [provider]  oxybutynin (DITROPAN) 5 MG tablet TAKE ONE TABLET BY MOUTH TWICE DAILY Patient taking  differently: Take 5 mg by mouth 2 (two) times daily. 12/17/21   Koberlein, Steele Berg, MD  polyethylene glycol (MIRALAX / GLYCOLAX) 17 g packet Take 17 g by mouth daily as needed for mild constipation.    [provider]  pravastatin (PRAVACHOL) 40 MG tablet TAKE 1 TABLET ONCE DAILY. Patient taking differently: Take 40 mg by mouth daily. 12/17/21   Koberlein, Steele Berg, MD  revefenacin (YUPELRI) 175 MCG/3ML nebulizer solution Take 3 mLs (175 mcg total) by nebulization daily. 09/23/20   Icard, Octavio Graves, DO  revefenacin (YUPELRI) 175 MCG/3ML nebulizer solution Take 3 mLs (175 mcg total) by nebulization daily. 03/14/22   Icard, Octavio Graves, DO  revefenacin (YUPELRI) 175 MCG/3ML nebulizer solution Take 3 mLs (175 mcg total) by nebulization daily. 04/04/22   Icard, Octavio Graves, DO  revefenacin (YUPELRI) 175 MCG/3ML nebulizer solution Take 3 mLs (175 mcg total) by nebulization daily. 04/26/22   Icard, Octavio Graves, DO  revefenacin (YUPELRI) 175 MCG/3ML nebulizer solution Take 3 mLs (175 mcg total) by nebulization daily. 04/26/22   Icard, Octavio Graves, DO  sertraline (ZOLOFT) 50 MG tablet TAKE ONE TABLET AT BEDTIME Patient taking differently: Take 50 mg by mouth at bedtime. 12/17/21   Caren Macadam, MD  thiamine 100 MG tablet Take 1 tablet (100 mg total) by mouth daily. 04/26/16   Love, Ivan Anchors, PA-C  triamcinolone cream (KENALOG) 0.1 % Apply 1 application. topically 2 (two) times daily as needed (irritation). 12/27/21   [provider]      Allergies    Lisinopril    Review of Systems   Review of Systems  Neurological:  Positive for seizures.  All other systems reviewed and are negative.   Physical Exam Updated Vital Signs BP 107/62 (BP Location: Left Arm)   Pulse 84   Temp (!) 96.9 F (36.1 C) (Axillary)   Resp (!) 26   SpO2 92%  Physical Exam Vitals and nursing note reviewed.   88 year old male, resting comfortably and in no acute distress. Vital signs are significant for elevated  respiratory rate. Oxygen saturation is 92%, which is normal. Head is normocephalic and atraumatic. PERRLA, EOMI. Oropharynx is clear. Neck is immobilized in a stiff cervical collar, and is nontender without adenopathy or JVD. Back is nontender and there is no CVA tenderness. Lungs are clear without rales, wheezes, or rhonchi. Chest is nontender. Heart has regular rate and rhythm with 2/6 systolic murmur best heard along the left sternal border. Abdomen is soft, flat, nontender without masses or hepatosplenomegaly and peristalsis is normoactive. Extremities have 2+ pedal edema, full range of motion is present. Skin is warm and dry without rash. Neurologic: Awake and alert, cranial nerves are intact, t moves all extremities equally.  ED Results / Procedures / Treatments   Labs (all labs ordered are listed, but only abnormal results are displayed) Labs Reviewed  COMPREHENSIVE METABOLIC PANEL  CBC WITH DIFFERENTIAL/PLATELET  LACTIC ACID, PLASMA  CK  EKG EKG Interpretation  Date/Time:  Thursday May 03 2022 04:34:49 EDT Ventricular Rate:  76 PR Interval:  250 QRS Duration: 197 QT Interval:  529 QTC Calculation: 595 R Axis:   106 Text Interpretation: Sinus rhythm Multiform ventricular premature complexes Prolonged PR interval Left atrial enlargement Nonspecific intraventricular conduction delay Inferior infarct, age indeterminate ST depr, consider ischemia, anterolateral lds When compared with ECG of 02/12/2022, No significant change was found Confirmed by Delora Fuel ([id number]) on 05/13/2022 4:45:38 AM  Radiology CUP PACEART REMOTE DEVICE CHECK  Result Date: 05/02/2022 Scheduled remote reviewed. Normal device function.  Optivol crossed threshold 7/11 and is ongoing 21 AF events, longest duration 59mn, burden <0.1%, ASA only.  (lovenox noted on MAR) Next remote 91 days. LA   Procedures Procedures  Cardiac monitor shows sinus rhythm with PVCs, per my interpretation.  Medications  Ordered in ED Medications - No data to display  ED Course/ Medical Decision Making/ A&P                           Medical Decision Making Amount and/or Complexity of Data Reviewed Labs: ordered. Radiology: ordered.  Risk Prescription drug management.   Apparent seizure.  It is not clear whether fall preceded the seizure or not.  Question is whether this seizure was primary or the fall was primary.  Old records are reviewed, and I have not seen any relevant past visits, no prior visits for seizures.  I have ordered a dose of levetiracetam, and I have ordered laboratory work-up of CBC, comprehensive metabolic panel.  I have also ordered lactic acid level and total CK to look for evidence of seizures.  I have ordered a CT of head and cervical spine to look for evidence of trauma from the fall.  I have reviewed and interpreted his ECG and my interpretation is sinus rhythm with multiple PVCs, prolonged QT interval, ST depression which is unchanged from baseline.  CT of head shows no acute process.  CT of cervical spine shows a nondisplaced fracture of the transverse process of C4.  I have independently viewed the images, and I have personally discussed the findings with the radiologist.  This fracture is stable.  I have discussed the radiology findings with MViona Gilmore who is on-call for neurosurgery.  She has reviewed the CT scan and agrees that it is a stable fracture and does not require any treatment or any follow-up unless he is symptomatic.  He does not require a cervical collar.  I have reviewed all of the laboratory tests, and my interpretation is mild hyponatremia which is not felt to be clinically significant, metabolic acidosis consistent with recent seizure, mild to moderate renal insufficiency which is worse than what was present on 02/15/2022 but essentially the same as what it was on 02/13/2022, mild elevation of transaminases which is not felt to be clinically significant, mildly  elevated magnesium level which is not felt to be clinically significant, slightly low total CK, elevated lactic acid level consistent with recent seizure, moderate macrocytic anemia not significantly changed from recent values, mild to moderate thrombocytopenia which is unchanged from baseline.  His defibrillator was interrogated, and he had 5 runs of ventricular tachycardia which required shocks, however the exact time of these episodes was not on the report that I viewed.  It seems likely at this point that his syncope and seizures were secondary to cerebral anoxia from his episodes of ventricular tachycardia.  Since this appears to  be the cause of his seizures, I do not feel that he needs anticonvulsant therapy.  I have discussed the case with Dr. Rory Percy of neurosurgery service who states that he will arrange for an EEG to be done emergently and, if nonfocal, patient would not need to be on anticonvulsant therapy.  Case has also been discussed with Dr. Debara Pickett, on-call for cardiology, who agrees to come see the patient in consultation and will also arrange for cardiac electrophysiology to evaluate the patient.  He may need to be on anticonvulsant therapy to limit the number of times he gets shocked, or he may need to have the latency before shocks be reduced so that he does not have syncope when he does have ventricular tachycardia.  Decision regarding admission will be left to cardiology.  Case is signed out to Dr. Roslynn Amble.  CRITICAL CARE Performed by: Delora Fuel Total critical care time: 70 minutes Critical care time was exclusive of separately billable procedures and treating other patients. Critical care was necessary to treat or prevent imminent or life-threatening deterioration. Critical care was time spent personally by me on the following activities: development of treatment plan with patient and/or surrogate as well as nursing, discussions with consultants, evaluation of patient's response to  treatment, examination of patient, obtaining history from patient or surrogate, ordering and performing treatments and interventions, ordering and review of laboratory studies, ordering and review of radiographic studies, pulse oximetry and re-evaluation of patient's condition.  Final Clinical Impression(s) / ED Diagnoses Final diagnoses:  Seizure ([place])  Ventricular tachycardia ([place])  Closed fracture of transverse process of cervical vertebra, initial encounter (HCC)  Hyponatremia  Renal insufficiency  Elevated transaminase level  Macrocytic anemia  Thrombocytopenia ([place])    Rx / DC Orders ED Discharge Orders     None         Delora Fuel, MD 75/17/00 [phone]

## 2022-05-03 NOTE — Assessment & Plan Note (Signed)
-   Continue home Synthroid once meds verified

## 2022-05-03 NOTE — Consult Note (Signed)
Consultation Note Date: 04/30/2022   Patient Name: [name]  DOB: 26-Nov-1930  MRN: [id number]  Age / Sex: 88 y.o., male  PCP: Jesse Macadam, MD (Inactive) Referring Physician: Martyn Malay, MD  Reason for Consultation: Establishing goals of care  HPI/Patient Profile: 88 y.o. male  with past medical history of  heart failure with reduced EF, VT s/p ICD, CAD s/p stent, and recent R hip fracture admitted on 04/21/2022 with  fall and ventricular tachycardia s/p multiple shocks this AM.   Patient has had ventricular tachycardia both at SNF and upon arrival to the ED.  He has changed CODE STATUS as well.  PMT has been consulted to assist with goals of care conversation.  Clinical Assessment and Goals of Care:  I have reviewed medical records including EPIC notes, labs and imaging, assessed the patient and then met at the bedside with daughter [name] to discuss diagnosis prognosis, GOC, EOL wishes, disposition and options.  I introduced Palliative Medicine as specialized medical care for people living with serious illness. It focuses on providing relief from the symptoms and stress of a serious illness. The goal is to improve quality of life for both the patient and the family.  We discussed a brief life review of the patient and then focused on their current illness.   I attempted to elicit values and goals of care important to the patient.    Medical History Review and Understanding:  Patient's daughter and I discussed acute illness and events today in the context of his multiple chronic comorbidities.  Social History: Patient was living at home alone with a caregiver assisting 6 hours/day before his hip fracture in April.  He has been at [facility] since then.  Daughter [name] tells me that he gets bored at times at the facility and often tries to walk around to see different areas.  He has 2  daughters and 1 son.  His son lives in [place] and other daughter lives in [place].  His wife died in Dec 19, 2015 on hospice.  Functional and Nutritional State: Patient initially had a hard time with physical therapy at [facility] after his hip fracture.  However, he was eventually able to make progress to climbing stairs and walking 80 to 100 yards without fatiguing.  He seems to take 1 step forward and 2 steps back, as for the past 1-2 weeks he has started having trouble with his breathing and swelling.  Palliative Symptoms: Pain, dyspnea, edema  Advance Directives: A detailed discussion regarding advanced directives was had.  AD on file shows all 3 children are listed as HCPOA.   Code Status: Concepts specific to code status, artifical feeding and hydration, and rehospitalization were considered and discussed.  [name] tells me that patient has requested to be full code upon admission.  Family is uncertain about why DNR was initially signed in April.    Discussion: Patient is unable/unwilling to participate in goals of care conversation as he is very focused on his right ankle pain, which has worsened since his fall today.  His daughter asked him if he is hearing contents of our discussion, he states yes he is following along but otherwise did not contribute.  [name] tells me that when he arrived he stated his reasoning for changing CODE STATUS was "I do not want to die."  She tells me his quality of life has been declining over the past few months.  We discussed the importance of determining whether he would like to  prioritize his comfort and pain management or prioritize his quantity of life with medical interventions aimed at prolonging life.  She understands, as palliative care was also involved in her mother's care before her death.  While this brings up bad memories, she is willing to have ongoing conversations to ensure we are honoring [name]'s wishes.  They have held conference calls as a family before,  but she would like to find out if her sister plans to come in person (which she usually does.)  We discussed the potential harm of ongoing recurrent ICD shocks and she tells me patient does not seem to mind, as this is the first time he did not remember it.  In the past he has said it feels like being struck by lightning.    The difference between aggressive medical intervention and comfort care was considered in light of the patient's goals of care.  Discussed the importance of continued conversation with family and the medical providers regarding overall plan of care and treatment options, ensuring decisions are within the context of the patient's values and GOCs.   Questions and concerns were addressed.  Hard Choices booklet left for review. The family was encouraged to call with questions or concerns.  PMT will continue to support holistically.   SUMMARY OF RECOMMENDATIONS   -Continue full code/full scope treatment -Patient's daughter is hopeful that he will be more willing to participate in [place] discussions once his ankle pain is addressed -Ongoing discussions based on clinical course, with family meeting pending - family to set preferred date/time -Psychosocial emotional support provided -PMT will continue to follow and support  Prognosis:  Poor long-term prognosis given arrhythmia, several chronic comorbidities, ongoing decline, advanced age  Discharge Planning: To Be Determined      Primary Diagnoses: Present on Admission:  Syncope   Physical Exam Vitals and nursing note reviewed.  Constitutional:      General: He is not in acute distress.    Appearance: He is ill-appearing.  Cardiovascular:     Rate and Rhythm: Normal rate.  Pulmonary:     Effort: Pulmonary effort is normal.  Skin:    General: Skin is warm and dry.  Neurological:     Mental Status: He is alert.  Psychiatric:        Attention and Perception: He is inattentive.    Vital Signs: BP 108/81   Pulse  70   Temp (!) 96.9 F (36.1 C) (Axillary)   Resp 16   SpO2 98%         SpO2: SpO2: 98 % O2 Device:SpO2: 98 % O2 Flow Rate: .   Palliative Assessment/Data: 40%     MDM: High   Sharelle Burditt Johnnette Litter, PA-C  Palliative Medicine Team Team phone # [phone]  Thank you for allowing the Palliative Medicine Team to assist in the care of this patient. Please utilize secure chat with additional questions, if there is no response within 30 minutes please call the above phone number.  Palliative Medicine Team providers are available by phone from 7am to 7pm daily and can be reached through the team cell phone.  Should this patient require assistance outside of these hours, please call the patient's attending physician.

## 2022-05-03 NOTE — Procedures (Signed)
Patient Name: [name]  MRN: [id number]  Epilepsy Attending: Lora Havens  Referring Physician/Provider: Amie Portland, MD  Date: 04/23/2022 Duration: 23.16 mins  Patient history: 88 year old male with seizure-like activity.  EEG to evaluate for seizure.  Level of alertness: Awake  AEDs during EEG study:LEV  Technical aspects: This EEG study was done with scalp electrodes positioned according to the 10-20 International system of electrode placement. Electrical activity was acquired at a sampling rate of '500Hz'$  and reviewed with a high frequency filter of '70Hz'$  and a low frequency filter of '1Hz'$ . EEG data were recorded continuously and digitally stored.   Description: No clear posterior dominant rhythm was seen. EEG showed continuous generalized 3 to 7 Hz theta-delta slowing admixed with 12 to 15 Hz generalized beta activity.  Hyperventilation and photic stimulation were not performed.     ABNORMALITY - Continuous slow, generalized  IMPRESSION: This study is suggestive of moderate diffuse encephalopathy, nonspecific to etiology.  No seizures or epileptiform discharges were seen throughout the recording.  Makila Colombe Barbra Sarks

## 2022-05-03 NOTE — ED Notes (Signed)
CCM at bedside 

## 2022-05-03 NOTE — Significant Event (Signed)
PCCM interval progress note:  Pt developed VT again overnight and shortly after lost pulses.  A code blue was activated and pt was intubated.  He underwent at least 4 rounds ACLS including defibrillation, Bicarb, epinephrine pushes and amiodarone bolus but did not regain pulses.  Family on the unit and requested that efforts be stopped.  Pt passed at 2056.   Otilio Carpen Elayjah Chaney, PA-C

## 2022-05-03 NOTE — Progress Notes (Signed)
EEG completed, results pending. 

## 2022-05-03 NOTE — Consult Note (Addendum)
NAME:  [name], MRN:  [id number], DOB:  12-04-1930, LOS: 0 ADMISSION DATE:  05/17/2022, CONSULTATION DATE:  05/10/2022 REFERRING MD:  Jeani Hawking, MD Family Medicine, CHIEF COMPLAINT:  [name]  History of present illness   Patient with a history of HFrEF, VT arrest s/p ICD placement, CAD s/p stenting, AAA repair in 2002, COPD, and recent R hip fracture presenting from a SNF with a fall and multiple episodes of ventricular tachycardia s/p multiple shocks from his AICD.  Patient was sitting on the toilet around 4a today and fell off, SNF staff initially thought that he had seized and he was brought to the [facility]. This in the setting of several days of acute-on-chronic constipation, previously treated with MiraLAX. Family also reports recent swelling in his R ankle and shortness of breath that has been treated with Ativan at the facility.  In the ED CT Head obtained with no acute abnormalities, CT C-spine with C4 non-displaced fracture. Neurosurgery consulted with no recommendation for further intervention. Admission labs significant for Lactic Acid 4.1, Mag 2.5, anion gap metabolic acidosis with gap 18, serum bicarb 17,  Hgb 8.4. Pt received loading dose of Keppra for presumed seizure, Neuro consulted and obtained EEG with no evidence of seizure, recommended no further AEDs.  ICD interrogation revealed multiple episodes of V-Tach with subsequent firing--presumed cause of his fall earlier in the day. Admitted initially to family medicine teaching service. Patient given amiodarone bolus and started on gtt. Cardiology/EP consulted recommended Echo and Lasix. Patient had subsequent Vtach event while in X-ray, confirmed on pacemaker interrogation though patient was not on telemetry at the time. Started on lidocaine gtt and PCCM consulted for ICU admission.   Past Medical History  HFrEF, VT arrest s/p ICD placement, CAD s/p stenting, and recent R hip fracture  Significant Hospital Events   - 7/13  Recurrent V Tach on amiodarone gtt, started on lidocaine and admitted to ICU for monitoring  Consults:  Cardiology, EP, Neurology, Neurosurgery, Palliative Care   Procedures:    Significant Diagnostic Tests:  Lactic acid - 4.1 Bicarb, serum- 17 Anion gap- 18 CT C-Spine with minimally displaced fracture of the Left C4 transverse process  EEG with moderate diffuse encephalopathy without seizures or epileptiform discharges  Interim history/subjective:  PCCM asked to consult due to need for lidocaine gtt which cannot be administered on the floor.  Patient had DNR form transferred with him from SNF, however, asked that this be revoked during discussion of code status in the ED earlier in the day. His daughter states that she intends to discuss this further with his other daughter and son to clarify the family's wishes. Palliative care consulted to help facilitate this conversation.   Objective   Blood pressure 100/78, pulse (!) 108, temperature (!) 97.4 F (36.3 C), temperature source Oral, resp. rate (!) 23, SpO2 94 %.       No intake or output data in the 24 hours ending 05/13/2022 1534 There were no vitals filed for this visit.  Examination: Constitutional: Acute on chronically ill appearing older gentleman in no acute distress Eyes: eyes are anicteric, reactive to light Ears, nose, mouth, and throat: mucous membranes moist, trachea midline Cardiovascular: RRR, 2/6 systolic murmur Respiratory: Normal WOB on RA, coarse breath sounds throughout but without foci of crackles or diminishment, no wheezes Gastrointestinal: abdomen is soft with + BS Skin: No rashes, normal turgor Neurologic: Occasionally engages interviewer appropriately, interspersed with episodes of disengagement, answers most questions appropriately  Extremities: 2+  pitting edema to mid-shin on R, 1+ pedal edema of L  Resolved Hospital Problem list     Assessment & Plan:  Ventricular Tachycardia requiring multiple  AICD shocks HFrEF (EF 30% on most recent echo) CAD s/p stenting - last cath in 2017 Unclear proximal cause for V Tach storm--electrolytes not grossly deranged. ?new cardiac ischemia given burden of disease noted on cath in 2017 vs CHF exacerbation vs previously unidentified pulmonary HTN. Appears fluid up, likely needs several days of diuresis.  - Admit to ICU  - Amiodarone and Lidocaine drips - Cardiology and EP following - F/u echo - Palliative care consulted to clarify goals of care/code status  - Trend troponin - If family desires ongoing aggressive care, may benefit from repeat cath, defer to cardiology - s/p Lasix '60mg'$  IV x1, likely needs several days diuresis - caution with fluids given EF - Strict I/O, daily weights  - Continue home pravastatin  Anion Gap Metabolic Acidosis Lactic acid 4.1 - Repeat lactate and CMP  Macrocytic Anemia  Hgb 8.4, MCV 101.1. Hemoglobin was normal 2 months ago, unclear source of his anemia, no identified source of blood loss. - Transfuse for Hgb <8 given CAD history - Anemia panel with next lab draw - FOBT   Constipation Chronic issue, possible contributing to straining on toilet leading to event above - MiraLAX + Colace - If fails initial management, would consider enema  Hypothyroidism - Restart home Synthroid tomorrow - TSH  Hypotension Per chart review, patient is on midodrine at home. BP Has been appropriate here. -Restart midodrine if becomes hypotensive   R Ankle arthropathy XR with soft tissue swelling without acute fracture. Unlikely to be contributing to presentation, but obviously bothersome to patient.  - Tylenol + voltaren gel for pain management   Goals of Care Patient has voiced desire for full code despite previously documented DNR. Given multiple comorbidities, do not believe patient would tolerate aggressive resuscitation measures well.  - Palliative care consulted - Family to continue discussing  Best practice:   Diet: NPO except sips with meds Pain/Anxiety/Delirium protocol (if indicated): None for now VAP protocol (if indicated): Not indicated DVT prophylaxis: SQ Heparin GI prophylaxis: Not indicated Glucose control: Sensitive SSI  Mobility: PT/OT eval and treat Code Status: Full Family Communication: Discussed care with daughter in the ED Disposition: Admit to ICU   [place] (weekly)  Last goals of care discussion was 7/13 with patient's daughter present.  Full code is current plan Next goals of care discussion is due once daughter, [name], is able to further discuss with her siblings, she is hopeful that the patient will wake up enough to participate in the conversations himself in a more fulsome manner.  Medical Decision Making    Diagnoses that are immediately life threatening include recurrent VTach Interventions today to address these diagnoses are amiodarone and lidocaine continuous infusions Likelihood of life-threatening deterioration without intervention is high.  Labs   CBC: Recent Labs  Lab 05/02/2022 0508  WBC 9.7  NEUTROABS 8.1*  HGB 8.4*  HCT 26.7*  MCV 101.1*  PLT 105*    Basic Metabolic Panel: Recent Labs  Lab 05/09/2022 0508 04/23/2022 0510  NA 131*  --   K 4.3  --   CL 96*  --   CO2 17*  --   GLUCOSE 217*  --   BUN 46*  --   CREATININE 1.70*  --   CALCIUM 8.5*  --   MG  --  2.5*   GFR: CrCl  cannot be calculated (Unknown ideal weight.). Recent Labs  Lab 05/07/2022 0508 05/02/2022 0510  WBC 9.7  --   LATICACIDVEN  --  4.1*    Liver Function Tests: Recent Labs  Lab 05/19/2022 0508  AST 46*  ALT 45*  ALKPHOS 112  BILITOT 0.4  PROT 6.0*  ALBUMIN 2.8*   No results for input(s): "LIPASE", "AMYLASE" in the last 168 hours. No results for input(s): "AMMONIA" in the last 168 hours.  ABG    Component Value Date/Time   PHART 7.466 (H) 03/30/2016 0400   PCO2ART 25.6 (L) 03/30/2016 0400   PO2ART 131 (H) 03/30/2016 0400   HCO3 18.2 (L) 03/30/2016 0400    TCO2 26 02/11/2022 0937   ACIDBASEDEF 4.9 (H) 03/30/2016 0400   O2SAT 99.0 03/30/2016 0400     Coagulation Profile: No results for input(s): "INR", "PROTIME" in the last 168 hours.  Cardiac Enzymes: Recent Labs  Lab 05/21/2022 0508  CKTOTAL 46*    HbA1C: Hgb A1c MFr Bld  Date/Time Value Ref Range Status  04/02/2016 05:04 AM 5.1 4.8 - 5.6 % Final    Comment:    (NOTE)         Pre-diabetes: 5.7 - 6.4         Diabetes: >6.4         Glycemic control for adults with diabetes: <7.0     CBG: No results for input(s): "GLUCAP" in the last 168 hours.  Review of Systems:   Mental status precludes participation but does endorse pain in R ankle  Past Medical History  He,  has a past medical history of AAA (abdominal aortic aneurysm) ([place]), AICD (automatic cardioverter/defibrillator) present, Allergic rhinitis, Back pain, CAD (coronary artery disease), Carotid arterial disease ([place]), Chronic combined systolic and diastolic CHF (congestive heart failure) ([place]), Compression fracture, COPD (chronic obstructive pulmonary disease) ([place]), Dermatitis, Diverticulosis of colon, DJD (degenerative joint disease), Hemorrhoids, History of colonic polyps, History of gout, History of pneumonia, Hypercholesterolemia, Hypertension, Hypertensive heart disease, Ischemic cardiomyopathy, Osteoporosis, Peripheral vascular disease ([place]), Presence of cardiac defibrillator, Presence of permanent cardiac pacemaker, Skin cancer, Syncope, T wave over sensing resulting in inappropriate shocks, Tobacco abuse, and Ventricular tachycardia ([place]) (04/01/2016).   Surgical History    Past Surgical History:  Procedure Laterality Date   ABDOMINAL AORTIC ANEURYSM REPAIR  2002   by Dr. Kellie Simmering   aicd placed  03/2008   Dr. Caryl Comes   cad stent  02/2002   Dr. Percival Spanish   CARDIAC CATHETERIZATION     X 2 stents   CARDIAC CATHETERIZATION N/A 04/03/2016   Procedure: Left Heart Cath and Coronary Angiography;  Surgeon: Belva Crome, MD;   Location: [place] [facility];  Service: Cardiovascular;  Laterality: N/A;   CARDIAC DEFIBRILLATOR PLACEMENT  2009   CARDIAC DEFIBRILLATOR PLACEMENT     CAROTID ENDARTERECTOMY Right January 02, 2011   EAR CYST EXCISION Left 12/13/2015   Procedure: Excision left ear lesion ;  Surgeon: Melissa Montane, MD;  Location: [place];  Service: ENT;  Laterality: Left;   EP IMPLANTABLE DEVICE N/A 04/06/2016   Procedure:  ICD Generator Changeout;  Surgeon: Deboraha Sprang, MD;  Location: [place] [facility];  Service: Cardiovascular;  Laterality: N/A;   EXCISION OF LESION LEFT EAR Left 12/13/2015   EXTERNAL EAR SURGERY Left    Cancer Removal from left ear   I & D EXTREMITY Left 04/23/2016   Procedure: IRRIGATION AND DEBRIDEMENT HAND;  Surgeon: Leandrew Koyanagi, MD;  Location: [place];  Service: Orthopedics;  Laterality: Left;   INTRAMEDULLARY (IM) NAIL INTERTROCHANTERIC Right 02/13/2022   Procedure: INTRAMEDULLARY (IM) NAIL INTERTROCHANTRIC;  Surgeon: Altamese Irvington, MD;  Location: [place];  Service: Orthopedics;  Laterality: Right;   IR IMAGE GUIDED DRAINAGE BY PERCUTANEOUS CATHETER  10/08/2017   lumbar back     done after compression fracture   OPEN REDUCTION INTERNAL FIXATION (ORIF) METACARPAL Left 04/04/2016   Procedure: OPEN REDUCTION INTERNAL FIXATION (ORIF) LEFT 2ND, 3RD, 4TH METACARPAL FRACTURE;  Surgeon: Leandrew Koyanagi, MD;  Location: [place];  Service: Orthopedics;  Laterality: Left;  OPEN REDUCTION INTERNAL FIXATION (ORIF) LEFT 2ND, 3RD, 4TH METACARPAL FRACTURE   OPEN REDUCTION INTERNAL FIXATION (ORIF) METACARPAL Left 04/23/2016   Procedure: REVISION OPEN REDUCTION INTERNAL FIXATION (ORIF) 2ND METACARPAL;  Surgeon: Leandrew Koyanagi, MD;  Location: [place];  Service: Orthopedics;  Laterality: Left;   right corotid enderectomy  12/2010   Dr. Scot Dock   SKIN SPLIT GRAFT Left 12/13/2015   Procedure: with possible skin graft;  Surgeon: Melissa Montane, MD;  Location: [place];  Service: ENT;  Laterality: Left;   SQUAMOUS CELL CARCINOMA EXCISION      on back, forehead, arm   TONSILLECTOMY       Social History   reports that he quit smoking about 14 years ago. His smoking use included cigarettes, cigars, and pipe. He has a 97.50 pack-year smoking history. He has never used smokeless tobacco. He reports current alcohol use of about 15.0 standard drinks of alcohol per week. He reports that he does not use drugs.   Family History   His family history includes Alcohol abuse in his father; Anxiety disorder in his son; Cancer in his mother; Heart disease in his mother; Heart disease (age of onset: 59) in his father; Hypertension in his father and mother; Scoliosis in his sister; Thyroid disease in his daughter.   Allergies Allergies  Allergen Reactions   Lisinopril     BP dropped too low per daughter     Home Medications  Prior to Admission medications   Medication Sig Start Date End Date Taking? Authorizing Provider  allopurinol (ZYLOPRIM) 100 MG tablet TAKE 1 TABLET ONCE DAILY. Patient taking differently: Take 100 mg by mouth daily. 07/18/21  Yes Minus Breeding, MD  amiodarone (PACERONE) 100 MG tablet Take 100 mg by mouth See admin instructions. Give 1 tablet by mouth one time a day every Mon, Tue, Wed, Thu, Fri for abnormal heart rhythm. 04/30/22  Yes [provider]  budesonide (PULMICORT) 0.5 MG/2ML nebulizer solution Take 2 mLs (0.5 mg total) by nebulization 2 (two) times daily. 01/27/19  Yes Icard, Octavio Graves, DO  Cholecalciferol (VITAMIN D) 125 MCG (5000 UT) CAPS Take 1 capsule by mouth daily. 02/15/22  Yes Ainsley Spinner, PA-C  collagenase (SANTYL) 250 UNIT/GM ointment Apply 1 Application topically daily. Apply to lower back wound topically every day shift for wound healing. Cleanse with NS, pat dry, apply santyl, calcium alginate and cover with ABD pad secure with tape daily.   Yes [provider]  docusate sodium (COLACE) 100 MG capsule Take 1 capsule (100 mg total) by mouth 2 (two) times daily. 02/15/22  Yes Ainsley Spinner, PA-C  fluticasone (CUTIVATE) 0.05 % cream Apply 1 application. topically 2 (two) times daily as needed (pain). 12/27/21  Yes [provider]  formoterol (PERFOROMIST) 20 MCG/2ML nebulizer solution Take 2 mLs (20 mcg total) by nebulization 2 (two) times daily. 12/10/18  Yes Icard, Bradley L, DO  furosemide (LASIX) 20  MG tablet TAKE 1 TABLET ONCE DAILY. Patient taking differently: Take 20 mg by mouth daily. 08/22/21  Yes Deboraha Sprang, MD  guaiFENesin (MUCINEX) 600 MG 12 hr tablet Take 600 mg by mouth in the morning, at noon, in the evening, and at bedtime.    Yes [provider]  HYDROcodone-acetaminophen (NORCO/VICODIN) 5-325 MG tablet Take 1 tablet by mouth every 8 (eight) hours as needed for severe pain. 02/16/22  Yes Thurnell Lose, MD  Infant [employer] [facility]) OINT Apply 1 Application topically daily. Apply to buttocks topically every shift for protection. 04/11/22  Yes [provider]  levothyroxine (SYNTHROID) 125 MCG tablet TAKE ONE TABLET BY MOUTH ONCE DAILY Patient taking differently: Take 125 mcg by mouth daily before breakfast. 01/22/22  Yes Koberlein, Junell C, MD  LORazepam (ATIVAN) 0.5 MG tablet Take 0.5 mg by mouth daily as needed for anxiety.   Yes [provider]  LORazepam (ATIVAN) 2 MG/ML injection Inject 0.5 mLs into the muscle once.   Yes [provider]  magnesium oxide (MAG-OX) 400 (241.3 Mg) MG tablet Take 0.5 tablets (200 mg total) by mouth daily. 04/27/16  Yes Love, Ivan Anchors, PA-C  midodrine (PROAMATINE) 10 MG tablet Take 10 mg by mouth 3 (three) times daily. 03/06/22  Yes [provider]  mirtazapine (REMERON) 7.5 MG tablet Take 7.5 mg by mouth at bedtime. 04/25/22  Yes [provider]  Multiple Vitamin (MULTIVITAMIN ADULT PO) Take 1 tablet by mouth daily.   Yes [provider]  OXYGEN Inhale 2-4 L into the lungs as needed (for SATS <92% every shift).   Yes [provider]   pantoprazole (PROTONIX) 40 MG tablet Take 40 mg by mouth daily. 04/25/22  Yes [provider]  Pollen Extracts (PROSTAT PO) Take 30 mg by mouth 2 (two) times daily.   Yes [provider]  polyethylene glycol (MIRALAX / GLYCOLAX) 17 g packet Take 17 g by mouth daily as needed for mild constipation.   Yes [provider]  pravastatin (PRAVACHOL) 40 MG tablet TAKE 1 TABLET ONCE DAILY. Patient taking differently: Take 40 mg by mouth at bedtime. 12/17/21  Yes Koberlein, Steele Berg, MD  revefenacin (YUPELRI) 175 MCG/3ML nebulizer solution Take 3 mLs (175 mcg total) by nebulization daily. 04/26/22  Yes Icard, Bradley L, DO  tamsulosin (FLOMAX) 0.4 MG CAPS capsule Take 0.4 mg by mouth at bedtime. 04/07/22  Yes [provider]  thiamine 100 MG tablet Take 1 tablet (100 mg total) by mouth daily. 04/26/16  Yes Love, Ivan Anchors, PA-C  triamcinolone cream (KENALOG) 0.1 % Apply 1 application. topically 2 (two) times daily as needed (irritation). 12/27/21  Yes [provider]  amiodarone (PACERONE) 200 MG tablet TAKE 1 TABLET ('200MG'$ ) BY MOUTH 5 DAYS PER WEEK. Patient not taking: Reported on 05/15/2022 09/12/21   Deboraha Sprang, MD  aspirin EC 81 MG tablet Take 81 mg by mouth daily. 05/05/22   [provider]  enoxaparin (LOVENOX) 40 MG/0.4ML injection Inject 0.4 mLs (40 mg total) into the skin daily. Patient not taking: Reported on 04/27/2022 02/16/22   Thurnell Lose, MD  feeding supplement (ENSURE ENLIVE / ENSURE PLUS) LIQD Take 237 mLs by mouth 3 (three) times daily between meals. Patient not taking: Reported on 05/14/2022 02/15/22   Ainsley Spinner, PA-C  midodrine (PROAMATINE) 2.5 MG tablet TAKE 1 TABLET 3 TIMES DAILY WITH MEALS. Patient not taking: Reported on 04/30/2022 12/04/21   Deboraha Sprang, MD  oxybutynin [facility])  5 MG tablet TAKE ONE TABLET BY MOUTH TWICE DAILY Patient not taking: Reported on 05/15/2022 12/17/21   Caren Macadam, MD  sertraline (ZOLOFT) 50  MG tablet TAKE ONE TABLET AT BEDTIME Patient not taking: Reported on 05/05/2022 12/17/21   Caren Macadam, MD     Critical care time: 3 min     Pearla Dubonnet, MD 3:52 PM
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
## 2022-05-03 NOTE — Progress Notes (Signed)
eLink Physician-Brief Progress Note Patient Name: [name] DOB: 04/10/1931 MRN: [id number]   Date of Service  04/26/2022  HPI/Events of Note  Frequent episodes of NSVT associated with hypotension. Patient has AICD and Amiodarone and Lidocaine IV infusions. Now NSR with rate = 55-65. BP = 95/74.  eICU Interventions  Plan: Defer management to cardiology.     Intervention Category Major Interventions: Arrhythmia - evaluation and management  Annamary Buschman [place] 04/21/2022, 7:28 PM

## 2022-05-03 NOTE — Progress Notes (Signed)
Auto generated IV consult for USGPIV.  Patient currently has not started on vasopressor and has 3 PIVs.  Securechat with Janett Billow, RN regarding patient needs.  Janett Billow will re enter consult if patient starts are vasopressor and needs an USGPIV.

## 2022-05-03 NOTE — ED Notes (Signed)
[employer] [place]) notified this RN of pacemaker interrogation. EDP notified of results.

## 2022-05-03 NOTE — Progress Notes (Signed)
   04/24/2022 2247  Clinical Encounter Type  Visited With Patient and family together  Visit Type Code;Death  Referral From Nurse  Consult/Referral To Chaplain   Chaplain Jorene Guest responded to the page. Upon entering unit, this chaplain received update on the patient's condition. Met the patient's daughter in [facility] waiting area. Provided grief support to the patient's daughter, [name]. Prayed per her request. This chaplain actively listened as she explored the presence of God in her father's last hours. She engaged in storytelling which seem to bring her comfort. [name] has selected [facility] in [place]. She will be the contact person. Her address is [address] [place]., [place] [phone]. This chaplain also gave her the Patient Placement card. Visit ended with her expressing appreciation of emotional/ spiritual support and for the medical team. This note was prepared by Jeanine Luz, M.Div..  For questions please contact by phone [phone].

## 2022-05-03 NOTE — ED Notes (Signed)
Patient transported to CT 

## 2022-05-03 NOTE — Assessment & Plan Note (Signed)
Daughter states at nursing facility has been experiencing increased agitation and was given a dose of Ativan. Today he is A&O x3 and daughter states he seems more out of it than when she saw him yesterday. - Continue to monitor mental status - Avoid sedating medication

## 2022-05-03 NOTE — Assessment & Plan Note (Signed)
From fall in April of this year.S/p ORIF. Daughter states since this fall there has been a fairly significant decline.  She does state that his right hip is hurting but was not prior to fall, which I suspect has worsened from this current fall.  Takes hydrocodone acetaminophen 5-325 mg every 8 hours as needed for severe pain

## 2022-05-03 NOTE — Progress Notes (Signed)
ICM monthly remote transmission rescheduled to 05/21/2022 due to patient is hospitalized.

## 2022-05-03 NOTE — ED Notes (Signed)
EEG at bedside.

## 2022-05-03 NOTE — Assessment & Plan Note (Addendum)
Likely secondary to V. tach.  Less likely due to seizures with normal EEG. - Admit to FPTS cardiac telemetry, attending Dr. Owens Shark - neurology following, appreciate recommendations - Cardiology and EP following appreciate recommendations - Given increased pain of right extremity after fall will get right hip, knee, and foot x-ray - Swallow eval prior to diet - Tylenol 650 mg every 6 as needed - Voltaren gel 4 times daily as needed - Strict I's and O's - PT OT eval

## 2022-05-03 NOTE — Assessment & Plan Note (Signed)
CT head with non displaced fracture of C4 transverse process.  Per neurosurgery no further treatment needed.

## 2022-05-03 NOTE — Assessment & Plan Note (Signed)
Echo in 2021 with EF 30-35% GIDD. Home medication: Lasix '20mg'$ . Was given IV lasix '60mg'$  x2 - Cardiology following, appreciate recommendations - Echo - Strict I's and O's

## 2022-05-03 NOTE — ED Notes (Signed)
Admitting provider paged & notified of reported seizure-like activity, no new orders at this time. Family at bedside notified of same

## 2022-05-03 NOTE — Assessment & Plan Note (Signed)
She has DNR form bedside, but when discussed with patient he would like full resuscitation.  He was not sure when or why that form was filled out.  When discussing with daughter she also stated she had not ever had that conversation with her dad, but that she will talk with the sister about it.  Discussed about palliative consult, and she expressed her hesitation because her mom passed shortly after talking to palliative though she knows it was not because of that discussion but it just brings back bad memories.  We will continue to have ongoing conversations.

## 2022-05-03 NOTE — ED Notes (Signed)
Per previous RN, c-collar removed, c-spine cleared prior to this RN arrival

## 2022-05-03 NOTE — Assessment & Plan Note (Signed)
Hgb 8.4 likely anemia of chronic disease though cannot rule out GI bleed as patient has history of LGIB.  Transfusion threshold 8.  -Continue to monitor with a.m. CBC

## 2022-05-03 NOTE — Progress Notes (Signed)
Paged for patient having more seizure like activity during attempts at imaging.   Device interrogation showed pt had recurrent VT ~ 207 bpm with 3 Bursts which ultimately slowed the rhythm, 2 ramps that were ineffective, and ultimately a shock restoring NSR.   Discussed with Dr. Curt Bears.  HS trop now and trend.  With h/o CAD, start Lidocaine low dose at 1 not to further confound his cognitive picture.   Adjusted VT zones to provide 2 bursts at a cycle length of 88%, then 3 additional bursts at cycle length of 84% as ramps were ineffective.   EKG showed "depression" in V4-V6 that appears stable from EKG in 07/2021.  Legrand Como [address] [place], PA-C  05/09/2022 2:02 PM

## 2022-05-03 NOTE — Progress Notes (Signed)
eLink Physician-Brief Progress Note Patient Name: [name] DOB: 12-10-1930 MRN: [id number]   Date of Service  05/18/2022  HPI/Events of Note  Multiple issues: 1. Hypotension - BP = 70/55 with MAP = 60. 2. Oliguria   eICU Interventions  Plan: Phenylephrine IV infusion via PIV. Titrate to MAP >= 65. Lasix 60 mg IV X 1 when BP has improved.      Intervention Category Major Interventions: Hypotension - evaluation and management;Other:  Lysle Dingwall 05/20/2022, 9:09 PM

## 2022-05-04 LAB — BPAM RBC
Blood Product Expiration Date: 202307292359
ISSUE DATE / TIME: 202307132034
Unit Type and Rh: 6200

## 2022-05-04 LAB — TYPE AND SCREEN
ABO/RH(D): A POS
Antibody Screen: NEGATIVE
Unit division: 0

## 2022-05-08 ENCOUNTER — Telehealth: Payer: Self-pay | Admitting: Pulmonary Disease

## 2022-05-09 NOTE — Telephone Encounter (Signed)
Last office note printed and faxed. Nothing further needed

## 2022-05-15 ENCOUNTER — Ambulatory Visit: Payer: Medicare Other | Admitting: Pulmonary Disease

## 2022-05-15 ENCOUNTER — Telehealth: Payer: Medicare Other

## 2022-05-21 MED FILL — Medication: Qty: 1 | Status: AC

## 2022-05-21 NOTE — Progress Notes (Signed)
Remote ICD transmission.   

## 2022-05-22 NOTE — Death Summary Note (Signed)
DEATH SUMMARY   Patient Details  Name: [name] MRN: [id number] DOB: 05/06/1931  Admission/Discharge Information   Admit Date:  05-11-22  Date of Death: Date of Death: May 11, 2022  Time of Death: Time of Death: 2256/01/11 (Simultaneous filing. User may not have seen previous data.)  Length of Stay: 1  Referring Physician: Caren Macadam, MD (Inactive)   Reason(s) for Hospitalization  Syncope due to ventricular tachycardia  Diagnoses  Preliminary cause of death:  Ventricular tachycardia Secondary Diagnoses (including complications and co-morbidities):  Principal Problem:   V tach ([place]) Active Problems:   COPD GOLD III   Acute on chronic combined systolic and diastolic congestive heart failure (HCC)   S/P ORIF (open reduction internal fixation) fracture   Hypothyroidism   Orthostatic hypotension   CAD S/P percutaneous coronary angioplasty   Fall   Age-related cognitive decline   Syncope   AKI (acute kidney injury) ([place])   Goals of care, counseling/discussion   Anemia   C4 cervical fracture ([place])   VT (ventricular tachycardia) [facility])   Brief Hospital Course (including significant findings, care, treatment, and services provided and events leading to death)  88 y/o male with an extensive cardiovascular history presented today after he had syncope on the toilette.  He had been experiencing 2-3 days of dyspnea and leg swelling which is not a typical problem for him.  He has been quite constipated for several months since his fall and hip fracture in April.  He was brought to the ER for further evaluation and he has had a few episodes of VT.  When he went to Xray he had an episode of convulsive motor movements and loss of consciousness.  He was not on telemetry at the time but after his AICD was interrogated it was found that he went into VT during that episode.  He had already been treated with amiodarone, after that he was started on lidocaine.  PCCM was consulted for ICU  admission at that point.    He was admitted to the ICU with the following problem based plan:  Syncope due to VT> EEG per neuro, minimize sedating medications Ventricular tachycardic, recurrent> suspect he has a CHF exacerbation based on physical exam, obtain CXR, continue amiodarone and lidocaine infusion, admit to ICU, monitor electrolytes, keep K > 4, Mg > 2, f/u cardiology recommendations HFrEF> looks like acute exacerbation, check CXR, monitor hemodynamics and diurese if BP supports Non-displaced C4 fracture> NSGY consulted in ER, supportive care Lactic acidosis> presumably from VT earlier today, though at risk for ischemic gut; suspect that his abdominal cramping is from constipation but need to watch the lactic level closely; repeat again this evening Constipation> bowel regimen with miralax, colace Hypothryoid> synthroid in AM, check TST   Goals of care: this patient has multiple comorbid illnesses and advanced age.  He is critically ill due to recurrent VT and loss of conciousness.  His overall likelihood of death in the next 30 days is very high.  In the event of cardiac or respiratory arrest CPR and mechanical ventilation are very unlikely to provide medical benefit.  I explained this to his daughter at length and she plans to discuss with her siblings and palliative medicine.   Several hours after admission he developed VT again and a cardiac arrest.  CPR was performed per ACLS protocol but did not regain pulses.  Family requested that we stop resuscitation efforts.    Pertinent Labs and Studies  Significant Diagnostic Studies DG CHEST PORT 1  VIEW  Result Date: 04/28/2022 CLINICAL DATA:  Seizure, shortness of breath EXAM: PORTABLE CHEST 1 VIEW COMPARISON:  Portable exam 1651 hours compared to 02/11/2022 FINDINGS: LEFT subclavian ICD with leads projecting over RIGHT atrium and RIGHT ventricle. Enlargement of cardiac silhouette with slight pulmonary vascular congestion.  Atherosclerotic calcification and tortuosity of thoracic aorta. Bibasilar pleural effusions and atelectasis increased from previous study. No segmental consolidation or pneumothorax. Bones demineralized with thoracic scoliosis. IMPRESSION: Enlargement of cardiac silhouette with pulmonary vascular congestion. BILATERAL pleural effusions and bibasilar atelectasis. Aortic Atherosclerosis (ICD10-I70.0). Electronically Signed   By: Lavonia Dana M.D.   On: 04/28/2022 17:05   DG Foot 2 Views Right  Result Date: 05/05/2022 CLINICAL DATA:  [id number] history of fall at the care facility, pain EXAM: RIGHT FOOT - 2 VIEW COMPARISON:  None Available. FINDINGS: There is no evidence of fracture or dislocation. There is mild arthropathy seen at the tibiotalar, intertarsal and tarsometatarsal joints with mildly prominent marginal osteophytes. Soft tissues are unremarkable. IMPRESSION: No fracture or dislocation.  Mild arthropathy. Electronically Signed   By: Frazier Richards M.D.   On: 05/19/2022 14:30   DG Ankle Right Port  Result Date: 04/27/2022 CLINICAL DATA:  Post fall after seizure.  RIGHT foot and ankle pain. EXAM: PORTABLE RIGHT ANKLE - 2 VIEW COMPARISON:  Foot evaluation of the same date. FINDINGS: Soft tissue swelling about the ankle and foot. No sign of dislocation. No sign of fracture about the ankle. Degenerative changes about the ankle. IMPRESSION: Soft tissue swelling without acute fracture. Electronically Signed   By: Zetta Bills M.D.   On: 05/02/2022 14:27   DG Knee Right Port  Result Date: 04/27/2022 CLINICAL DATA:  [id number] history of fall at [facility] facility after apparent seizure, pain EXAM: PORTABLE RIGHT KNEE - 1-2 VIEW COMPARISON:  None Available. FINDINGS: No evidence of fracture, dislocation, or joint effusion. No evidence of arthropathy or other focal bone abnormality. Soft tissues are unremarkable. Severe atheromatous calcifications of the femoral-popliteal artery and its branches IMPRESSION:  Unremarkable two views of the right knee. Electronically Signed   By: Frazier Richards M.D.   On: 05/20/2022 14:24   DG HIP UNILAT WITH PELVIS 2-3 VIEWS RIGHT  Result Date: 05/01/2022 CLINICAL DATA:  Unwitnessed fall in bathroom.  Seizure. EXAM: DG HIP (WITH OR WITHOUT PELVIS) 2-3V RIGHT COMPARISON:  02/13/2022 FINDINGS: Status post intramedullary nail with lag screw of the RIGHT femur. There is associated callus and heterotopic bone. No acute fracture or subluxation. Pelvis is intact. Proximal aspect of the LEFT femur is unremarkable. Degenerative changes are seen in the LOWER spine. IMPRESSION: 1.  No evidence for acute  abnormality. 2. Callus and heterotopic bone in the region of the RIGHT hip following remote ORIF. Electronically Signed   By: Nolon Nations M.D.   On: 05/07/2022 12:56   EEG adult  Result Date: 04/25/2022 Lora Havens, MD     05/05/2022  9:13 AM Patient Name: [name] MRN: [id number] Epilepsy Attending: Lora Havens Referring Physician/Provider: Amie Portland, MD Date: 05/21/2022 Duration: 23.16 mins Patient history: 88 year old male with seizure-like activity.  EEG to evaluate for seizure. Level of alertness: Awake AEDs during EEG study:LEV Technical aspects: This EEG study was done with scalp electrodes positioned according to the 10-20 International system of electrode placement. Electrical activity was acquired at a sampling rate of '500Hz'$  and reviewed with a high frequency filter of '70Hz'$  and a low frequency filter of '1Hz'$ . EEG data were recorded continuously and digitally stored. Description:  No clear posterior dominant rhythm was seen. EEG showed continuous generalized 3 to 7 Hz theta-delta slowing admixed with 12 to 15 Hz generalized beta activity.  Hyperventilation and photic stimulation were not performed.   ABNORMALITY - Continuous slow, generalized IMPRESSION: This study is suggestive of moderate diffuse encephalopathy, nonspecific to etiology.  No seizures or  epileptiform discharges were seen throughout the recording. Lora Havens   CT Cervical Spine Wo Contrast  Addendum Date: 05/11/2022   ADDENDUM REPORT: 04/23/2022 06:18 ADDENDUM: Study discussed by telephone with Dr. Delora Fuel on 3/66/4403 at 0609 hours. Electronically Signed   By: Genevie Ann M.D.   On: 04/23/2022 06:18   Result Date: 05/14/2022 CLINICAL DATA:  88 year old male status post unwitnessed fall in bathroom. Observed seizure like activity. EXAM: CT CERVICAL SPINE WITHOUT CONTRAST TECHNIQUE: Multidetector CT imaging of the cervical spine was performed without intravenous contrast. Multiplanar CT image reconstructions were also generated. RADIATION DOSE REDUCTION: This exam was performed according to the departmental dose-optimization program which includes automated exposure control, adjustment of the mA and/or kV according to patient size and/or use of iterative reconstruction technique. COMPARISON:  Head CT today.  Cervical spine CT 02/12/2022. FINDINGS: Alignment: Reversal of cervical lordosis has not significantly changed since April. Cervicothoracic junction alignment is within normal limits. Bilateral posterior element alignment is within normal limits. Skull base and vertebrae: Osteopenia. Visualized skull base is intact. No atlanto-occipital dissociation. Stable C1-C2 alignment. Chronic anterior odontoid degenerative subchondral cyst. Chronic C3-C4 facet ankylosis on the left. Subtle nondisplaced left C4 transverse process fracture best seen on series 8, image 44. Posterior element alignment there is maintained. Bulky chronic C4 through C7 anterior endplate osteophytosis, with subsequent chronic interbody ankylosis C6-C7. No other acute osseous abnormality identified. Soft tissues and spinal canal: No prevertebral fluid or swelling. No visible canal hematoma. Bulky calcified cervical carotid atherosclerosis on the left. Disc levels: Left C3-C4 facet ankylosis and interbody ankylosis at  C6-C7. Stable cervical spine degeneration elsewhere, mild if any associated cervical spinal stenosis. 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 Chronic fracture of the medial left clavicle appears stable. Partially visible left chest pacemaker type leads. Chronic pleural thickening or pleural effusion in the left lung apex is stable. Advanced bilateral subclavian calcified atherosclerosis is chronic. IMPRESSION: 1. Osteopenia, with a subtle acute minimally displaced fracture of the Left C4 transverse process. 2. No other acute traumatic injury identified in the cervical spine. 3. 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 Electronically Signed: By: Genevie Ann M.D. On: 05/02/2022 06:06   CT Head Wo Contrast  Result Date: 05/02/2022 CLINICAL DATA:  88 year old male status post unwitnessed fall in bathroom. Observed seizure like activity. EXAM: CT HEAD WITHOUT CONTRAST TECHNIQUE: Contiguous axial images were obtained from the base of the skull through the vertex without intravenous contrast. RADIATION DOSE REDUCTION: This exam was performed according to the departmental dose-optimization program which includes automated exposure control, adjustment of the mA and/or kV according to patient size and/or use of iterative reconstruction technique. COMPARISON:  Head CT 02/11/2022 and earlier. FINDINGS: Brain: Stable cerebral volume. Mild ex vacuo appearing ventricular enlargement. Patchy and confluent bilateral cerebral white matter hypodensity. Stable gray-white matter differentiation throughout the brain. No midline shift, mass effect, evidence of mass lesion, intracranial hemorrhage or evidence of cortically based acute infarction. Vascular: Extensive Calcified atherosclerosis at the skull base. No suspicious intracranial  vascular hyperdensity. Skull: Stable.  No acute osseous abnormality identified. Sinuses/Orbits: Visualized paranasal sinuses and mastoids are stable and well aerated. Other: No acute orbit or scalp soft tissue injury identified. IMPRESSION: Stable since April. No acute intracranial abnormality or acute traumatic injury identified. Electronically Signed   By: Genevie Ann M.D.   On: 05/16/2022 05:59   CUP PACEART REMOTE DEVICE CHECK  Result Date: 05/02/2022 Scheduled remote reviewed. Normal device function.  Optivol crossed threshold 7/11 and is ongoing 21 AF events, longest duration 46mn, burden <0.1%, ASA only.  (lovenox noted on MAR) Next remote 91 days. [facility]  Microbiology No results found for this or any previous visit (from the past 240 hour(s)).  Lab Basic Metabolic Panel: No results for input(s): "NA", "K", "CL", "CO2", "GLUCOSE", "BUN", "CREATININE", "CALCIUM", "MG", "PHOS" in the last 168 hours. Liver Function Tests: No results for input(s): "AST", "ALT", "ALKPHOS", "BILITOT", "PROT", "ALBUMIN" in the last 168 hours. No results for input(s): "LIPASE", "AMYLASE" in the last 168 hours. No results for input(s): "AMMONIA" in the last 168 hours. CBC: No results for input(s): "WBC", "NEUTROABS", "HGB", "HCT", "MCV", "PLT" in the last 168 hours. Cardiac Enzymes: No results for input(s): "CKTOTAL", "CKMB", "CKMBINDEX", "TROPONINI" in the last 168 hours. Sepsis Labs: No results for input(s): "PROCALCITON", "WBC", "LATICACIDVEN" in the last 168 hours.  Procedures/Operations  CPR   BRoselie Awkward7/24/2023, 5:23 PM

## 2022-05-22 DEATH — deceased

## 2022-06-12 ENCOUNTER — Ambulatory Visit: Payer: Medicare Other | Admitting: Cardiology

## 2022-07-11 ENCOUNTER — Ambulatory Visit: Payer: Medicare Other

## 2022-07-24 ENCOUNTER — Ambulatory Visit: Payer: Medicare Other | Admitting: Rheumatology
